# Patient Record
Sex: Female | Born: 1940 | Race: White | Hispanic: No | Marital: Married | Smoking: Former smoker
Health system: Southern US, Community
[De-identification: ages and names within clinical notes are randomized; demographics above are authoritative.]

## PROBLEM LIST (undated history)

## (undated) DIAGNOSIS — I809 Phlebitis and thrombophlebitis of unspecified site: Secondary | ICD-10-CM

## (undated) DIAGNOSIS — K2100 Gastro-esophageal reflux disease with esophagitis, without bleeding: Secondary | ICD-10-CM

## (undated) DIAGNOSIS — M349 Systemic sclerosis, unspecified: Secondary | ICD-10-CM

## (undated) DIAGNOSIS — G4733 Obstructive sleep apnea (adult) (pediatric): Secondary | ICD-10-CM

## (undated) DIAGNOSIS — R0600 Dyspnea, unspecified: Secondary | ICD-10-CM

## (undated) DIAGNOSIS — R011 Cardiac murmur, unspecified: Secondary | ICD-10-CM

## (undated) DIAGNOSIS — Z9989 Dependence on other enabling machines and devices: Secondary | ICD-10-CM

## (undated) DIAGNOSIS — J849 Interstitial pulmonary disease, unspecified: Secondary | ICD-10-CM

## (undated) DIAGNOSIS — J8410 Pulmonary fibrosis, unspecified: Secondary | ICD-10-CM

## (undated) DIAGNOSIS — K2270 Barrett's esophagus without dysplasia: Secondary | ICD-10-CM

## (undated) DIAGNOSIS — K219 Gastro-esophageal reflux disease without esophagitis: Secondary | ICD-10-CM

## (undated) DIAGNOSIS — M069 Rheumatoid arthritis, unspecified: Secondary | ICD-10-CM

## (undated) DIAGNOSIS — Z85038 Personal history of other malignant neoplasm of large intestine: Secondary | ICD-10-CM

## (undated) DIAGNOSIS — I272 Pulmonary hypertension, unspecified: Secondary | ICD-10-CM

## (undated) DIAGNOSIS — Z7902 Long term (current) use of antithrombotics/antiplatelets: Secondary | ICD-10-CM

## (undated) DIAGNOSIS — M109 Gout, unspecified: Secondary | ICD-10-CM

## (undated) DIAGNOSIS — Z7960 Long term (current) use of unspecified immunomodulators and immunosuppressants: Secondary | ICD-10-CM

## (undated) DIAGNOSIS — Z79899 Other long term (current) drug therapy: Secondary | ICD-10-CM

## (undated) DIAGNOSIS — I10 Essential (primary) hypertension: Secondary | ICD-10-CM

## (undated) DIAGNOSIS — E119 Type 2 diabetes mellitus without complications: Secondary | ICD-10-CM

## (undated) DIAGNOSIS — K449 Diaphragmatic hernia without obstruction or gangrene: Secondary | ICD-10-CM

## (undated) DIAGNOSIS — R0609 Other forms of dyspnea: Secondary | ICD-10-CM

## (undated) DIAGNOSIS — E7800 Pure hypercholesterolemia, unspecified: Secondary | ICD-10-CM

## (undated) DIAGNOSIS — G4730 Sleep apnea, unspecified: Secondary | ICD-10-CM

## (undated) DIAGNOSIS — I700 Atherosclerosis of aorta: Secondary | ICD-10-CM

## (undated) DIAGNOSIS — I739 Peripheral vascular disease, unspecified: Secondary | ICD-10-CM

## (undated) DIAGNOSIS — N183 Chronic kidney disease, stage 3 unspecified: Secondary | ICD-10-CM

## (undated) DIAGNOSIS — I447 Left bundle-branch block, unspecified: Secondary | ICD-10-CM

## (undated) DIAGNOSIS — Z9981 Dependence on supplemental oxygen: Secondary | ICD-10-CM

## (undated) DIAGNOSIS — D126 Benign neoplasm of colon, unspecified: Secondary | ICD-10-CM

## (undated) DIAGNOSIS — C801 Malignant (primary) neoplasm, unspecified: Secondary | ICD-10-CM

## (undated) DIAGNOSIS — M1990 Unspecified osteoarthritis, unspecified site: Secondary | ICD-10-CM

## (undated) HISTORY — DX: Unspecified osteoarthritis, unspecified site: M19.90

## (undated) HISTORY — DX: Personal history of other malignant neoplasm of large intestine: Z85.038

## (undated) HISTORY — DX: Phlebitis and thrombophlebitis of unspecified site: I80.9

## (undated) HISTORY — DX: Pulmonary fibrosis, unspecified: J84.10

## (undated) HISTORY — DX: Gout, unspecified: M10.9

## (undated) HISTORY — DX: Gastro-esophageal reflux disease with esophagitis: K21.0

## (undated) HISTORY — DX: Gastro-esophageal reflux disease with esophagitis, without bleeding: K21.00

## (undated) HISTORY — PX: BREAST CYST ASPIRATION: SHX578

## (undated) HISTORY — DX: Essential (primary) hypertension: I10

## (undated) HISTORY — DX: Pure hypercholesterolemia, unspecified: E78.00

## (undated) HISTORY — DX: Rheumatoid arthritis, unspecified: M06.9

## (undated) HISTORY — PX: VEIN LIGATION AND STRIPPING: SHX2653

## (undated) HISTORY — DX: Pulmonary hypertension, unspecified: I27.20

## (undated) HISTORY — DX: Type 2 diabetes mellitus without complications: E11.9

## (undated) HISTORY — DX: Barrett's esophagus without dysplasia: K22.70

## (undated) HISTORY — DX: Systemic sclerosis, unspecified: M34.9

---

## 1970-05-12 HISTORY — PX: TUBAL LIGATION: SHX77

## 1970-05-12 HISTORY — PX: APPENDECTOMY: SHX54

## 2004-08-20 ENCOUNTER — Ambulatory Visit: Payer: Self-pay | Admitting: Unknown Physician Specialty

## 2004-10-15 ENCOUNTER — Ambulatory Visit: Payer: Self-pay | Admitting: Internal Medicine

## 2006-01-27 ENCOUNTER — Ambulatory Visit: Payer: Self-pay | Admitting: Internal Medicine

## 2007-06-29 ENCOUNTER — Ambulatory Visit: Payer: Self-pay | Admitting: Internal Medicine

## 2007-11-02 ENCOUNTER — Ambulatory Visit: Payer: Self-pay | Admitting: Internal Medicine

## 2008-02-02 ENCOUNTER — Ambulatory Visit: Payer: Self-pay | Admitting: Unknown Physician Specialty

## 2008-07-10 ENCOUNTER — Ambulatory Visit: Payer: Self-pay | Admitting: Internal Medicine

## 2009-08-27 ENCOUNTER — Ambulatory Visit: Payer: Self-pay | Admitting: Internal Medicine

## 2010-08-26 ENCOUNTER — Ambulatory Visit: Payer: Self-pay | Admitting: Rheumatology

## 2010-09-03 ENCOUNTER — Ambulatory Visit: Payer: Self-pay | Admitting: Internal Medicine

## 2010-11-18 LAB — PULMONARY FUNCTION TEST

## 2011-09-04 ENCOUNTER — Ambulatory Visit: Payer: Self-pay | Admitting: Internal Medicine

## 2012-02-24 DIAGNOSIS — I272 Pulmonary hypertension, unspecified: Secondary | ICD-10-CM

## 2012-02-24 HISTORY — DX: Pulmonary hypertension, unspecified: I27.20

## 2012-05-14 ENCOUNTER — Ambulatory Visit: Payer: Self-pay | Admitting: Unknown Physician Specialty

## 2012-05-17 LAB — PATHOLOGY REPORT

## 2012-05-22 ENCOUNTER — Encounter: Payer: Self-pay | Admitting: Internal Medicine

## 2012-05-22 DIAGNOSIS — M069 Rheumatoid arthritis, unspecified: Secondary | ICD-10-CM | POA: Insufficient documentation

## 2012-05-22 DIAGNOSIS — E1169 Type 2 diabetes mellitus with other specified complication: Secondary | ICD-10-CM | POA: Insufficient documentation

## 2012-05-22 DIAGNOSIS — I10 Essential (primary) hypertension: Secondary | ICD-10-CM

## 2012-05-22 DIAGNOSIS — M109 Gout, unspecified: Secondary | ICD-10-CM | POA: Insufficient documentation

## 2012-05-22 DIAGNOSIS — E119 Type 2 diabetes mellitus without complications: Secondary | ICD-10-CM | POA: Insufficient documentation

## 2012-05-22 DIAGNOSIS — K219 Gastro-esophageal reflux disease without esophagitis: Secondary | ICD-10-CM | POA: Insufficient documentation

## 2012-05-22 DIAGNOSIS — K2270 Barrett's esophagus without dysplasia: Secondary | ICD-10-CM

## 2012-05-22 DIAGNOSIS — M0579 Rheumatoid arthritis with rheumatoid factor of multiple sites without organ or systems involvement: Secondary | ICD-10-CM | POA: Insufficient documentation

## 2012-05-22 DIAGNOSIS — Z86010 Personal history of colonic polyps: Secondary | ICD-10-CM

## 2012-05-22 DIAGNOSIS — E7800 Pure hypercholesterolemia, unspecified: Secondary | ICD-10-CM

## 2012-05-25 ENCOUNTER — Encounter: Payer: Self-pay | Admitting: Internal Medicine

## 2012-05-25 ENCOUNTER — Ambulatory Visit (INDEPENDENT_AMBULATORY_CARE_PROVIDER_SITE_OTHER): Payer: Medicare Other | Admitting: Internal Medicine

## 2012-05-25 VITALS — BP 146/84 | HR 73 | Temp 98.4°F | Ht 67.0 in | Wt 215.2 lb

## 2012-05-25 DIAGNOSIS — I10 Essential (primary) hypertension: Secondary | ICD-10-CM

## 2012-05-25 DIAGNOSIS — E7800 Pure hypercholesterolemia, unspecified: Secondary | ICD-10-CM

## 2012-05-25 DIAGNOSIS — K219 Gastro-esophageal reflux disease without esophagitis: Secondary | ICD-10-CM

## 2012-05-25 DIAGNOSIS — Z86010 Personal history of colonic polyps: Secondary | ICD-10-CM

## 2012-05-25 DIAGNOSIS — E119 Type 2 diabetes mellitus without complications: Secondary | ICD-10-CM

## 2012-05-25 DIAGNOSIS — E039 Hypothyroidism, unspecified: Secondary | ICD-10-CM

## 2012-05-25 DIAGNOSIS — K2270 Barrett's esophagus without dysplasia: Secondary | ICD-10-CM

## 2012-05-25 DIAGNOSIS — M069 Rheumatoid arthritis, unspecified: Secondary | ICD-10-CM

## 2012-05-25 DIAGNOSIS — M109 Gout, unspecified: Secondary | ICD-10-CM

## 2012-05-25 MED ORDER — FLUTICASONE PROPIONATE 50 MCG/ACT NA SUSP: 2.0000 | Freq: Every day | NASAL | Status: DC

## 2012-05-25 NOTE — Progress Notes (Signed)
Subjective:    Patient ID: Nina Perez, female    DOB: [birth year], 72 y.o.   MRN: [id number]  HPI 72 year old female with past history of hypertension, inflammatory arthritis and diabetes who comes in today for a scheduled follow up.  She states she has been doing well.  Breathing stable.  Had her flu shot.  Some increased post nasal drainage.  Minimal nasal dripping.  No sinus pressure.  No fever.  No cough.  No acid reflux.  Joints doing well.  Brought in no sugar readings.  States am sugars averaging 120s.  Blood pressure varying.    Past Medical History  Diagnosis Date  . Hypertension   . Phlebitis     x2 (with pregnancy)  . Hypercholesterolemia   . Diabetes mellitus   . Gout   . Scleroderma     raynaud's, sclerodactyly, telangiectasias  . Rheumatoid arthritis     positive RF, FANA, RNP, negative CCP ab and anti DNA,.  neg anti-SCL 70  . Inflammatory arthritis   . Pulmonary fibrosis     mild  . Pulmonary hypertension   . Osteoarthritis     knees, spine  . Reflux esophagitis   . Barrett's esophagus   . History of colon cancer     adenomatous polyps    Current Outpatient Prescriptions on File Prior to Visit  Medication Sig Dispense Refill  . allopurinol (ZYLOPRIM) 300 MG tablet Take 300 mg by mouth daily.      Marland Kitchen amLODipine (NORVASC) 10 MG tablet Take 10 mg by mouth daily.      Marland Kitchen aspirin 81 MG tablet Take 81 mg by mouth daily.      . folic acid (FOLVITE) 1 MG tablet Take 1 mg by mouth daily.      . hydrochlorothiazide (HYDRODIURIL) 50 MG tablet Take 50 mg by mouth daily.      Marland Kitchen lisinopril (PRINIVIL,ZESTRIL) 40 MG tablet Take 40 mg by mouth daily.      . metFORMIN (GLUCOPHAGE) 500 MG tablet Take 500 mg by mouth 2 (two) times daily with a meal.      . methotrexate 2.5 MG tablet Take 6 tablets q week      . metoprolol succinate (TOPROL-XL) 50 MG 24 hr tablet Take 50 mg by mouth daily. Take with or immediately following a meal.      . naproxen (NAPROSYN) 500 MG tablet Take 500  mg by mouth 2 (two) times daily as needed.      Marland Kitchen omeprazole (PRILOSEC) 20 MG capsule Take 20 mg by mouth daily.      . fluticasone (FLONASE) 50 MCG/ACT nasal spray Place 2 sprays into the nose daily.  16 g  1    Review of Systems Patient denies any headache, lightheadedness or dizziness.  Some increased post nasal drainage. No chest pain, tightness or palpitations.  No increased shortness of breath, cough or congestion.  No nausea or vomiting.  No abdominal pain or cramping.  No bowel change, such as diarrhea, constipation, BRBPR or melana.  No urine change.   Joints doing well.      Objective:   Physical Exam Filed Vitals:   05/25/12 1128  BP: 146/84  Pulse: 73  Temp: 98.4 F (36.9 C)   Blood pressure recheck:  87/66  72 year old female in no acute distress.   HEENT:  Nares - clear.  OP- without lesions or erythema.  NECK:  Supple, nontender.  No audible bruit.   HEART:  Appears to be regular. LUNGS:  Without crackles or wheezing audible.  Respirations even and unlabored.   RADIAL PULSE:  Equal bilaterally.  ABDOMEN:  Soft, nontender.  No audible abdominal bruit.   EXTREMITIES:  No increased edema to be present.  Stable.  DP pulses palpable and equal bilaterally (2 plus).                   Assessment & Plan:  ALLERGIES.  Saline nasal spray and Flonase nasal spray as directed.  Notify me if any progression.   ELEVATED TSH.  Recheck to confirm wnl.   CARDIOVASCULAR.  Asymptomatic.  Continue risk factor modification.   HEALTH MAINTENANCE.  Physical 06/26/11.  Schedule physical at next visit.  Mammogram 09/04/11 - Birads I.  Colonoscopy as outlined.  Wanted to hold on pneumovax.  Will get at her lab appt.

## 2012-05-30 ENCOUNTER — Encounter: Payer: Self-pay | Admitting: Internal Medicine

## 2012-05-30 NOTE — Assessment & Plan Note (Signed)
Low cholesterol diet and exercise.  Follow lipid panel.   

## 2012-05-30 NOTE — Assessment & Plan Note (Signed)
Blood pressure is under reasonable control.  Same medication.  Check metabolic panel.

## 2012-05-30 NOTE — Assessment & Plan Note (Signed)
Just had colonoscopy.  See above.  Currently doing well.  Follow.

## 2012-05-30 NOTE — Assessment & Plan Note (Signed)
Sees Dr Gavin Potters.  Doing well.  On MTX.

## 2012-05-30 NOTE — Assessment & Plan Note (Signed)
Currently doing well on Prilosec.  Follow.   

## 2012-05-30 NOTE — Assessment & Plan Note (Signed)
Symptoms controlled.  Follow.  Just had EGD.  See above.

## 2012-05-30 NOTE — Assessment & Plan Note (Signed)
Brought in no recorded sugar readings.  Low carb/diabetic diet.  Follow. Check metabolic panel and a1c.  Follow.

## 2012-05-30 NOTE — Assessment & Plan Note (Signed)
Stable on Allopurinol.  Follow.   

## 2012-06-08 ENCOUNTER — Encounter: Payer: Self-pay | Admitting: Internal Medicine

## 2012-06-09 ENCOUNTER — Other Ambulatory Visit: Payer: Medicare Other

## 2012-06-17 ENCOUNTER — Other Ambulatory Visit (INDEPENDENT_AMBULATORY_CARE_PROVIDER_SITE_OTHER): Payer: Medicare Other

## 2012-06-17 DIAGNOSIS — E7800 Pure hypercholesterolemia, unspecified: Secondary | ICD-10-CM

## 2012-06-17 DIAGNOSIS — E119 Type 2 diabetes mellitus without complications: Secondary | ICD-10-CM

## 2012-06-17 DIAGNOSIS — E039 Hypothyroidism, unspecified: Secondary | ICD-10-CM

## 2012-06-17 LAB — BASIC METABOLIC PANEL
GFR: 71.02 mL/min (ref >60.00)
Glucose, Bld: 125 mg/dL — ABNORMAL HIGH (ref 70–99)
Potassium: 3.3 mEq/L — ABNORMAL LOW (ref 3.5–5.1)
Sodium: 139 mEq/L (ref 135–145)

## 2012-06-17 LAB — LIPID PANEL
HDL: 33.3 mg/dL — ABNORMAL LOW (ref >39.00)
LDL Cholesterol: 90 mg/dL (ref 0–99)
Total CHOL/HDL Ratio: 5
VLDL: 30.8 mg/dL (ref 0.0–40.0)

## 2012-06-17 LAB — HEPATIC FUNCTION PANEL
Alkaline Phosphatase: 70 U/L (ref 39–117)
Bilirubin, Direct: 0.1 mg/dL (ref 0.0–0.3)
Total Bilirubin: 0.8 mg/dL (ref 0.3–1.2)
Total Protein: 7.1 g/dL (ref 6.0–8.3)

## 2012-06-17 LAB — TSH: TSH: 4.19 u[IU]/mL (ref 0.35–5.50)

## 2012-06-25 ENCOUNTER — Other Ambulatory Visit: Payer: Medicare Other

## 2012-06-29 ENCOUNTER — Telehealth: Payer: Self-pay | Admitting: *Deleted

## 2012-06-29 NOTE — Telephone Encounter (Signed)
Pt is coming in for labs tomorrow (02.19.2014) what labs and dx would you like? Thank you  

## 2012-06-30 ENCOUNTER — Other Ambulatory Visit (INDEPENDENT_AMBULATORY_CARE_PROVIDER_SITE_OTHER): Payer: Medicare Other

## 2012-06-30 ENCOUNTER — Other Ambulatory Visit: Payer: Self-pay | Admitting: Internal Medicine

## 2012-06-30 DIAGNOSIS — E876 Hypokalemia: Secondary | ICD-10-CM

## 2012-06-30 NOTE — Telephone Encounter (Signed)
It is just a potassium check.  i placed the order for the lab.  Thanks.

## 2012-06-30 NOTE — Progress Notes (Signed)
Order placed for follow up potassium.  

## 2012-07-08 ENCOUNTER — Other Ambulatory Visit: Payer: Self-pay | Admitting: *Deleted

## 2012-07-08 MED ORDER — ALLOPURINOL 300 MG PO TABS: 300.0000 mg | ORAL_TABLET | Freq: Every day | ORAL | Status: DC

## 2012-07-08 NOTE — Telephone Encounter (Signed)
Prescription was sent in to wrong pharmacy.

## 2012-07-08 NOTE — Addendum Note (Signed)
Addended by: Marlene Lard on: 07/08/2012 01:48 PM   Modules accepted: Medications

## 2012-07-09 ENCOUNTER — Ambulatory Visit (INDEPENDENT_AMBULATORY_CARE_PROVIDER_SITE_OTHER): Payer: Medicare Other | Admitting: Internal Medicine

## 2012-07-09 ENCOUNTER — Encounter: Payer: Self-pay | Admitting: Internal Medicine

## 2012-07-09 VITALS — BP 144/88 | HR 72 | Temp 98.5°F | Ht 67.0 in | Wt 227.8 lb

## 2012-07-09 DIAGNOSIS — K219 Gastro-esophageal reflux disease without esophagitis: Secondary | ICD-10-CM

## 2012-07-09 DIAGNOSIS — M069 Rheumatoid arthritis, unspecified: Secondary | ICD-10-CM

## 2012-07-09 DIAGNOSIS — I10 Essential (primary) hypertension: Secondary | ICD-10-CM

## 2012-07-09 MED ORDER — SPIRONOLACTONE 25 MG PO TABS: 25.0000 mg | ORAL_TABLET | Freq: Every day | ORAL | Status: DC

## 2012-07-10 ENCOUNTER — Encounter: Payer: Self-pay | Admitting: Internal Medicine

## 2012-07-10 NOTE — Assessment & Plan Note (Signed)
Symptoms controlled.  Follow.  Just had EGD.  See above.  

## 2012-07-10 NOTE — Assessment & Plan Note (Signed)
Sees Dr Kernodle.  Doing well.  On MTX.       

## 2012-07-10 NOTE — Assessment & Plan Note (Signed)
Low cholesterol diet and exercise.  Follow lipid panel.   

## 2012-07-10 NOTE — Progress Notes (Signed)
Subjective:    Patient ID: Nina Perez, female    DOB: August 15, 1940, 72 y.o.   MRN: [id number]  HPI 72 year old female with past history of hypertension, inflammatory arthritis and diabetes who comes in today for a scheduled follow up.  She states she has been doing well.  Breathing stable.  No sinus pressure.  No fever.  No cough.  No acid reflux.  Joints doing well.  Brought in no sugar readings.  Blood pressure averaging mostly 130-140/70s.     Past Medical History  Diagnosis Date  . Hypertension   . Phlebitis     x2 (with pregnancy)  . Hypercholesterolemia   . Diabetes mellitus   . Gout   . Scleroderma     raynaud's, sclerodactyly, telangiectasias  . Rheumatoid arthritis     positive RF, FANA, RNP, negative CCP ab and anti DNA,.  neg anti-SCL 70  . Inflammatory arthritis   . Pulmonary fibrosis     mild  . Pulmonary hypertension   . Osteoarthritis     knees, spine  . Reflux esophagitis   . Barrett's esophagus   . History of colon cancer     adenomatous polyps    Current Outpatient Prescriptions on File Prior to Visit  Medication Sig Dispense Refill  . allopurinol (ZYLOPRIM) 300 MG tablet Take 1 tablet (300 mg total) by mouth daily.  30 tablet  5  . amLODipine (NORVASC) 10 MG tablet Take 10 mg by mouth daily.      Marland Kitchen aspirin 81 MG tablet Take 81 mg by mouth daily.      . fluticasone (FLONASE) 50 MCG/ACT nasal spray Place 2 sprays into the nose daily.  16 g  1  . folic acid (FOLVITE) 1 MG tablet Take 1 mg by mouth daily.      Marland Kitchen lisinopril (PRINIVIL,ZESTRIL) 40 MG tablet Take 40 mg by mouth daily.      . metFORMIN (GLUCOPHAGE) 500 MG tablet Take 500 mg by mouth 2 (two) times daily with a meal.      . methotrexate 2.5 MG tablet Take 6 tablets q week      . metoprolol succinate (TOPROL-XL) 50 MG 24 hr tablet Take 50 mg by mouth daily. Take with or immediately following a meal.      . naproxen (NAPROSYN) 500 MG tablet Take 500 mg by mouth 2 (two) times daily as needed.      Marland Kitchen  omeprazole (PRILOSEC) 20 MG capsule Take 20 mg by mouth daily.       No current facility-administered medications on file prior to visit.    Review of Systems Patient denies any headache, lightheadedness or dizziness.  No significant sinus or allergy symptoms.   No chest pain, tightness or palpitations.  No increased shortness of breath, cough or congestion.  No nausea or vomiting.  No abdominal pain or cramping.  No bowel change, such as diarrhea, constipation, BRBPR or melana.  No urine change.   Joints doing well.  Recent lab checks have revealed decreased potassium.  Here to discuss changing meds.       Objective:   Physical Exam  Filed Vitals:   07/09/12 1503  BP: 144/88  Pulse: 72  Temp: 98.5 F (36.9 C)   Blood pressure recheck:  21/35  72 year old female in no acute distress.   HEENT:  Nares - clear.  OP- without lesions or erythema.  NECK:  Supple, nontender.  No audible bruit.   HEART:  Appears  to be regular. LUNGS:  Without crackles or wheezing audible.  Respirations even and unlabored.   RADIAL PULSE:  Equal bilaterally.  ABDOMEN:  Soft, nontender.  No audible abdominal bruit.   EXTREMITIES:  No increased edema to be present.  Stable.  DP pulses palpable and equal bilaterally (2 plus).                   Assessment & Plan:  HYPOKALEMIA.  Will stop HCTZ.  Start spironolactone 25mg  q day.  Follow metabolic panel.  Check within the next 2 weeks.   CARDIOVASCULAR.  Asymptomatic.  Continue risk factor modification.   HEALTH MAINTENANCE.  Physical 06/26/11.  Physical scheduled in 4/14.  Mammogram 09/04/11 - Birads I.  Colonoscopy as outlined.

## 2012-07-10 NOTE — Assessment & Plan Note (Signed)
Stable on Allopurinol.  Follow.   

## 2012-07-10 NOTE — Assessment & Plan Note (Signed)
Brought in no recorded sugar readings.  Low carb/diabetic diet.  Follow.

## 2012-07-10 NOTE — Assessment & Plan Note (Signed)
Currently doing well on Prilosec.  Follow.

## 2012-07-10 NOTE — Assessment & Plan Note (Signed)
Just had colonoscopy.  See above.  Currently doing well.  Follow.  

## 2012-07-10 NOTE — Assessment & Plan Note (Signed)
Blood pressure as outlined.  Recent potassium - low.  Will stop hctz.  Start spironolactone 25mg  qday.  Check metabolic panel within the next 2 weeks.

## 2012-07-21 ENCOUNTER — Telehealth: Payer: Self-pay | Admitting: Internal Medicine

## 2012-07-21 ENCOUNTER — Other Ambulatory Visit (INDEPENDENT_AMBULATORY_CARE_PROVIDER_SITE_OTHER): Payer: Medicare Other

## 2012-07-21 DIAGNOSIS — I10 Essential (primary) hypertension: Secondary | ICD-10-CM

## 2012-07-21 LAB — BASIC METABOLIC PANEL
Chloride: 102 mEq/L (ref 96–112)
GFR: 63.14 mL/min (ref >60.00)
Glucose, Bld: 177 mg/dL — ABNORMAL HIGH (ref 70–99)
Potassium: 4.1 mEq/L (ref 3.5–5.1)
Sodium: 138 mEq/L (ref 135–145)

## 2012-07-21 MED ORDER — LISINOPRIL 40 MG PO TABS: 40.0000 mg | ORAL_TABLET | Freq: Every day | ORAL | Status: DC

## 2012-07-21 MED ORDER — AMLODIPINE BESYLATE 10 MG PO TABS: 10.0000 mg | ORAL_TABLET | Freq: Every day | ORAL | Status: DC

## 2012-07-21 NOTE — Telephone Encounter (Signed)
Appointment made

## 2012-07-21 NOTE — Telephone Encounter (Signed)
Pt notified of lab results and need for a follow up potassium check in a couple of weeks.  Wants to come in 08/04/12 at 10:30.  Please put on lab schedule.  Thanks.

## 2012-08-04 ENCOUNTER — Other Ambulatory Visit (INDEPENDENT_AMBULATORY_CARE_PROVIDER_SITE_OTHER): Payer: Medicare Other

## 2012-08-04 DIAGNOSIS — E876 Hypokalemia: Secondary | ICD-10-CM

## 2012-08-05 ENCOUNTER — Encounter: Payer: Self-pay | Admitting: *Deleted

## 2012-09-02 ENCOUNTER — Ambulatory Visit (INDEPENDENT_AMBULATORY_CARE_PROVIDER_SITE_OTHER): Payer: Medicare Other | Admitting: Internal Medicine

## 2012-09-02 ENCOUNTER — Encounter: Payer: Self-pay | Admitting: Internal Medicine

## 2012-09-02 ENCOUNTER — Other Ambulatory Visit (HOSPITAL_COMMUNITY)
Admission: RE | Admit: 2012-09-02 | Discharge: 2012-09-02 | Disposition: A | Discharge status: Home / Self Care | Payer: Medicare Other | Source: Ambulatory Visit | Attending: Internal Medicine | Admitting: Internal Medicine

## 2012-09-02 VITALS — BP 130/80 | HR 78 | Temp 98.5°F | Ht 65.75 in | Wt 225.8 lb

## 2012-09-02 DIAGNOSIS — M069 Rheumatoid arthritis, unspecified: Secondary | ICD-10-CM

## 2012-09-02 DIAGNOSIS — I10 Essential (primary) hypertension: Secondary | ICD-10-CM

## 2012-09-02 DIAGNOSIS — Z1239 Encounter for other screening for malignant neoplasm of breast: Secondary | ICD-10-CM

## 2012-09-02 DIAGNOSIS — Z01419 Encounter for gynecological examination (general) (routine) without abnormal findings: Secondary | ICD-10-CM | POA: Insufficient documentation

## 2012-09-02 DIAGNOSIS — Z860100 Personal history of colon polyps, unspecified: Secondary | ICD-10-CM

## 2012-09-02 DIAGNOSIS — K219 Gastro-esophageal reflux disease without esophagitis: Secondary | ICD-10-CM

## 2012-09-02 DIAGNOSIS — Z124 Encounter for screening for malignant neoplasm of cervix: Secondary | ICD-10-CM

## 2012-09-02 DIAGNOSIS — Z1151 Encounter for screening for human papillomavirus (HPV): Secondary | ICD-10-CM | POA: Insufficient documentation

## 2012-09-02 DIAGNOSIS — E119 Type 2 diabetes mellitus without complications: Secondary | ICD-10-CM

## 2012-09-02 DIAGNOSIS — M109 Gout, unspecified: Secondary | ICD-10-CM

## 2012-09-02 DIAGNOSIS — E7800 Pure hypercholesterolemia, unspecified: Secondary | ICD-10-CM

## 2012-09-02 DIAGNOSIS — K2270 Barrett's esophagus without dysplasia: Secondary | ICD-10-CM

## 2012-09-02 NOTE — Progress Notes (Signed)
Subjective:    Patient ID: Nina Perez, female    DOB: [birth year], 72 y.o.   MRN: [id number]  HPI 72 year old female with past history of hypertension, inflammatory arthritis and diabetes who comes in today to follow up on these issues as well as for a complete physical exam.  She states she has been doing well.  Breathing stable.  No sinus pressure.  No fever.  No cough.  No acid reflux.  Joints doing well.  Blood sugars running 100-160s.  No times recorded.   Blood pressure averaging mostly 120-140/70-80.  Overall she feels she is doing well.  Trying to do better - watching her diet.       Past Medical History  Diagnosis Date  . Hypertension   . Phlebitis     x2 (with pregnancy)  . Hypercholesterolemia   . Diabetes mellitus   . Gout   . Scleroderma     raynaud's, sclerodactyly, telangiectasias  . Rheumatoid arthritis     positive RF, FANA, RNP, negative CCP ab and anti DNA,.  neg anti-SCL 70  . Inflammatory arthritis   . Pulmonary fibrosis     mild  . Pulmonary hypertension   . Osteoarthritis     knees, spine  . Reflux esophagitis   . Barrett's esophagus   . History of colon cancer     adenomatous polyps    Current Outpatient Prescriptions on File Prior to Visit  Medication Sig Dispense Refill  . allopurinol (ZYLOPRIM) 300 MG tablet Take 1 tablet (300 mg total) by mouth daily.  30 tablet  5  . amLODipine (NORVASC) 10 MG tablet Take 1 tablet (10 mg total) by mouth daily.  90 tablet  1  . aspirin 81 MG tablet Take 81 mg by mouth daily.      . fluticasone (FLONASE) 50 MCG/ACT nasal spray Place 2 sprays into the nose daily.  16 g  1  . folic acid (FOLVITE) 1 MG tablet Take 1 mg by mouth daily.      Marland Kitchen lisinopril (PRINIVIL,ZESTRIL) 40 MG tablet Take 1 tablet (40 mg total) by mouth daily.  90 tablet  1  . metFORMIN (GLUCOPHAGE) 500 MG tablet Take 500 mg by mouth 2 (two) times daily with a meal.      . methotrexate 2.5 MG tablet Take 6 tablets q week      . metoprolol succinate  (TOPROL-XL) 50 MG 24 hr tablet Take 50 mg by mouth daily. Take with or immediately following a meal.      . naproxen (NAPROSYN) 500 MG tablet Take 500 mg by mouth 2 (two) times daily as needed.      Marland Kitchen omeprazole (PRILOSEC) 20 MG capsule Take 20 mg by mouth daily.      Marland Kitchen spironolactone (ALDACTONE) 25 MG tablet Take 1 tablet (25 mg total) by mouth daily.  30 tablet  1   No current facility-administered medications on file prior to visit.    Review of Systems Patient denies any headache, lightheadedness or dizziness.  No significant sinus or allergy symptoms.   No chest pain, tightness or palpitations.  No increased shortness of breath, cough or congestion.  No nausea or vomiting.  No abdominal pain or cramping.  No bowel change, such as diarrhea, constipation, BRBPR or melana.  No urine change.   Joints doing well.  Sugars and blood pressure as outlined.       Objective:   Physical Exam  Filed Vitals:   09/02/12 4098  BP: 130/80  Pulse: 78  Temp: 98.5 F (31.75 C)   72 year old female in no acute distress.   HEENT:  Nares- clear.  Oropharynx - without lesions. NECK:  Supple.  Nontender.  No audible bruit.  HEART:  Appears to be regular. LUNGS:  No crackles or wheezing audible.  Respirations even and unlabored.  RADIAL PULSE:  Equal bilaterally.    BREASTS:  No nipple discharge or nipple retraction present.  Could not appreciate any distinct nodules or axillary adenopathy.  ABDOMEN:  Soft, nontender.  Bowel sounds present and normal.  No audible abdominal bruit.  GU:  Normal external genitalia.  Vaginal vault without lesions.  Cervix identified.  Pap performed. Could not appreciate any adnexal masses or tenderness.   RECTAL:  Heme negative.   EXTREMITIES:  No increased edema present.  DP pulses palpable and equal bilaterally.            Assessment & Plan:  HYPOKALEMIA.  Started spironolactone 25mg  q day.  Potassium wnl now.    CARDIOVASCULAR.  Asymptomatic.  Continue risk factor  modification.   HEALTH MAINTENANCE.  Physical today.  Mammogram 09/04/11 - Birads I.  Schedule a follow up mammogram.  Colonoscopy as outlined.

## 2012-09-05 ENCOUNTER — Encounter: Payer: Self-pay | Admitting: Internal Medicine

## 2012-09-05 NOTE — Assessment & Plan Note (Signed)
Sees Dr Gavin Potters.  Doing well.  On MTX.

## 2012-09-05 NOTE — Assessment & Plan Note (Signed)
Low carb/diabetic diet.  Sugars as outlined.  Trying to do better watching her diet.  Follow.  Check and record sugars bid.  Keep up to date with eye checks.  Check metabolic panel and a1c.

## 2012-09-05 NOTE — Assessment & Plan Note (Signed)
Stable on Allopurinol.  Follow.   

## 2012-09-05 NOTE — Assessment & Plan Note (Addendum)
Symptoms controlled.  Follow.  Just had EGD.    

## 2012-09-05 NOTE — Assessment & Plan Note (Signed)
Up to date with colonoscopy.  Just had - 05/14/12.    

## 2012-09-05 NOTE — Assessment & Plan Note (Signed)
Blood pressure appears to be doing relatively well.  Same medication regimen. Follow.  Check metabolic panel with next labs.

## 2012-09-05 NOTE — Assessment & Plan Note (Signed)
Currently doing well on Prilosec.  Follow.

## 2012-09-05 NOTE — Assessment & Plan Note (Signed)
Low cholesterol diet and exercise.  Follow lipid panel.   

## 2012-09-10 ENCOUNTER — Other Ambulatory Visit: Payer: Self-pay | Admitting: *Deleted

## 2012-09-10 MED ORDER — SPIRONOLACTONE 25 MG PO TABS: 25.0000 mg | ORAL_TABLET | Freq: Every day | ORAL | Status: DC

## 2012-09-15 ENCOUNTER — Ambulatory Visit: Payer: Self-pay | Admitting: Internal Medicine

## 2012-10-08 ENCOUNTER — Encounter: Payer: Self-pay | Admitting: Internal Medicine

## 2012-10-27 ENCOUNTER — Telehealth: Payer: Self-pay | Admitting: Internal Medicine

## 2012-10-27 MED ORDER — METFORMIN HCL 500 MG PO TABS: 500.0000 mg | ORAL_TABLET | Freq: Two times a day (BID) | ORAL | Status: DC

## 2012-10-27 NOTE — Telephone Encounter (Signed)
Pt called needing refill on her metform 500mg  1 tablet by mouth twice daily [facility] in [place]

## 2012-10-27 NOTE — Telephone Encounter (Signed)
done

## 2012-11-08 ENCOUNTER — Other Ambulatory Visit: Payer: Self-pay | Admitting: *Deleted

## 2012-11-08 MED ORDER — GLUCOSE BLOOD VI STRP: ORAL_STRIP | Status: DC

## 2012-11-08 NOTE — Telephone Encounter (Signed)
Pt requested a RX for test strips to save money. Cheaper to get through pharmacy than OTC

## 2012-11-30 ENCOUNTER — Other Ambulatory Visit (INDEPENDENT_AMBULATORY_CARE_PROVIDER_SITE_OTHER): Payer: Medicare Other

## 2012-11-30 DIAGNOSIS — E7800 Pure hypercholesterolemia, unspecified: Secondary | ICD-10-CM

## 2012-11-30 DIAGNOSIS — I10 Essential (primary) hypertension: Secondary | ICD-10-CM

## 2012-11-30 DIAGNOSIS — E119 Type 2 diabetes mellitus without complications: Secondary | ICD-10-CM

## 2012-11-30 DIAGNOSIS — M069 Rheumatoid arthritis, unspecified: Secondary | ICD-10-CM

## 2012-11-30 LAB — BASIC METABOLIC PANEL
BUN: 17 mg/dL (ref 6–23)
Calcium: 10.4 mg/dL (ref 8.4–10.5)
Creatinine, Ser: 0.9 mg/dL (ref 0.4–1.2)
GFR: 64.67 mL/min (ref >60.00)

## 2012-11-30 LAB — HEPATIC FUNCTION PANEL
ALT: 24 U/L (ref 0–35)
AST: 26 U/L (ref 0–37)
Bilirubin, Direct: 0.1 mg/dL (ref 0.0–0.3)
Total Bilirubin: 0.5 mg/dL (ref 0.3–1.2)

## 2012-11-30 LAB — HEMOGLOBIN A1C: Hgb A1c MFr Bld: 6.5 % (ref 4.6–6.5)

## 2012-12-02 ENCOUNTER — Encounter: Payer: Self-pay | Admitting: Internal Medicine

## 2012-12-02 ENCOUNTER — Ambulatory Visit (INDEPENDENT_AMBULATORY_CARE_PROVIDER_SITE_OTHER): Payer: Medicare Other | Admitting: Internal Medicine

## 2012-12-02 VITALS — BP 110/80 | HR 64 | Temp 98.6°F | Ht 65.75 in | Wt 227.0 lb

## 2012-12-02 DIAGNOSIS — E119 Type 2 diabetes mellitus without complications: Secondary | ICD-10-CM

## 2012-12-02 DIAGNOSIS — E7800 Pure hypercholesterolemia, unspecified: Secondary | ICD-10-CM

## 2012-12-02 DIAGNOSIS — I10 Essential (primary) hypertension: Secondary | ICD-10-CM

## 2012-12-02 DIAGNOSIS — M069 Rheumatoid arthritis, unspecified: Secondary | ICD-10-CM

## 2012-12-02 DIAGNOSIS — Z86010 Personal history of colonic polyps: Secondary | ICD-10-CM

## 2012-12-02 DIAGNOSIS — K219 Gastro-esophageal reflux disease without esophagitis: Secondary | ICD-10-CM

## 2012-12-02 DIAGNOSIS — K2270 Barrett's esophagus without dysplasia: Secondary | ICD-10-CM

## 2012-12-02 DIAGNOSIS — M109 Gout, unspecified: Secondary | ICD-10-CM

## 2012-12-02 NOTE — Progress Notes (Signed)
Subjective:    Patient ID: Nina Perez, female    DOB: Jul 12, 1940, 72 y.o.   MRN: [id number]  HPI 72 year old female with past history of hypertension, inflammatory arthritis and diabetes who comes in today for a scheduled follow up.   She states she has been doing well.  Breathing stable.  No sinus pressure.  No fever.  No cough.  No acid reflux.  Joints doing well.  Blood sugars running 100-160s.  No times recorded.   Blood pressure averaging mostly 120-140/70-80.  Overall she feels she is doing well.  Trying to do better - watching her diet.       Past Medical History  Diagnosis Date  . Hypertension   . Phlebitis     x2 (with pregnancy)  . Hypercholesterolemia   . Diabetes mellitus   . Gout   . Scleroderma     raynaud's, sclerodactyly, telangiectasias  . Rheumatoid arthritis(714.0)     positive RF, FANA, RNP, negative CCP ab and anti DNA,.  neg anti-SCL 70  . Inflammatory arthritis   . Pulmonary fibrosis     mild  . Pulmonary hypertension   . Osteoarthritis     knees, spine  . Reflux esophagitis   . Barrett's esophagus   . History of colon cancer     adenomatous polyps    Current Outpatient Prescriptions on File Prior to Visit  Medication Sig Dispense Refill  . allopurinol (ZYLOPRIM) 300 MG tablet Take 1 tablet (300 mg total) by mouth daily.  30 tablet  5  . amLODipine (NORVASC) 10 MG tablet Take 1 tablet (10 mg total) by mouth daily.  90 tablet  1  . aspirin 81 MG tablet Take 81 mg by mouth daily.      . fluticasone (FLONASE) 50 MCG/ACT nasal spray Place 2 sprays into the nose daily.  16 g  1  . folic acid (FOLVITE) 1 MG tablet Take 1 mg by mouth daily.      Marland Kitchen glucose blood test strip Contour test strips-use to test blood sugars twice a day  100 each  12  . lisinopril (PRINIVIL,ZESTRIL) 40 MG tablet Take 1 tablet (40 mg total) by mouth daily.  90 tablet  1  . metFORMIN (GLUCOPHAGE) 500 MG tablet Take 1 tablet (500 mg total) by mouth 2 (two) times daily with a meal.  60  tablet  5  . methotrexate 2.5 MG tablet Take 6 tablets q week      . metoprolol succinate (TOPROL-XL) 50 MG 24 hr tablet Take 50 mg by mouth daily. Take with or immediately following a meal.      . naproxen (NAPROSYN) 500 MG tablet Take 500 mg by mouth 2 (two) times daily as needed.      Marland Kitchen omeprazole (PRILOSEC) 20 MG capsule Take 20 mg by mouth daily.      Marland Kitchen spironolactone (ALDACTONE) 25 MG tablet Take 1 tablet (25 mg total) by mouth daily.  30 tablet  5   No current facility-administered medications on file prior to visit.    Review of Systems Patient denies any headache, lightheadedness or dizziness.  No significant sinus or allergy symptoms.   No chest pain, tightness or palpitations.  No increased shortness of breath, cough or congestion.  No nausea or vomiting.  No abdominal pain or cramping.  No bowel change, such as diarrhea, constipation, BRBPR or melana.  No urine change.   Joints doing well.  Sugars and blood pressure as outlined.  Objective:   Physical Exam  Filed Vitals:   12/02/12 0915  BP: 110/80  Pulse: 64  Temp: 98.6 F (15 C)   72 year old female in no acute distress.   HEENT:  Nares- clear.  Oropharynx - without lesions. NECK:  Supple.  Nontender.  No audible bruit.  HEART:  Appears to be regular. LUNGS:  No crackles or wheezing audible.  Respirations even and unlabored.  RADIAL PULSE:  Equal bilaterally.  ABDOMEN:  Soft, nontender.  Bowel sounds present and normal.  No audible abdominal bruit.    EXTREMITIES:  No increased edema present.  DP pulses palpable and equal bilaterally.            Assessment & Plan:  HYPOKALEMIA.  Started spironolactone 25mg  q day.  Potassium wnl now.    CARDIOVASCULAR.  Asymptomatic.  Continue risk factor modification.   HEALTH MAINTENANCE.  Physical last visit.  Mammogram 09/15/12 - Birads II.   Colonoscopy as outlined.

## 2012-12-05 ENCOUNTER — Encounter: Payer: Self-pay | Admitting: Internal Medicine

## 2012-12-05 NOTE — Assessment & Plan Note (Signed)
Currently doing well on Prilosec.  Follow.

## 2012-12-05 NOTE — Assessment & Plan Note (Signed)
Stable on Allopurinol.  Follow.   

## 2012-12-05 NOTE — Assessment & Plan Note (Signed)
Symptoms controlled.  Follow.  Just had EGD.    

## 2012-12-05 NOTE — Assessment & Plan Note (Signed)
Low cholesterol diet and exercise.  Follow lipid panel.  Lipid panel just checked 12/02/12 revealed total cholesterol 167, triglycerides 133, HDL 36 and LDL104.

## 2012-12-05 NOTE — Assessment & Plan Note (Signed)
Blood pressure appears to be doing relatively well.  Same medication regimen.  Follow.  Follow metabolic panel.     

## 2012-12-05 NOTE — Assessment & Plan Note (Signed)
Low carb/diabetic diet.  Sugars as outlined.  Trying to do better watching her diet.  Follow.  Check and record sugars bid.  Keep up to date with eye checks.  Follow metabolic panel and a1c. A1c just checked 6.5.  Improved.

## 2012-12-05 NOTE — Assessment & Plan Note (Signed)
Sees Dr Gavin Potters.  Doing well.  On MTX.

## 2012-12-05 NOTE — Assessment & Plan Note (Signed)
Up to date with colonoscopy.  Just had - 05/14/12.    

## 2012-12-20 ENCOUNTER — Other Ambulatory Visit: Payer: Self-pay | Admitting: *Deleted

## 2012-12-20 MED ORDER — METOPROLOL SUCCINATE ER 50 MG PO TB24: 50.0000 mg | ORAL_TABLET | Freq: Every day | ORAL | Status: DC

## 2012-12-29 ENCOUNTER — Encounter: Payer: Self-pay | Admitting: *Deleted

## 2013-01-11 ENCOUNTER — Other Ambulatory Visit: Payer: Self-pay | Admitting: Internal Medicine

## 2013-01-26 ENCOUNTER — Other Ambulatory Visit: Payer: Self-pay | Admitting: *Deleted

## 2013-01-26 MED ORDER — AMLODIPINE BESYLATE 10 MG PO TABS: 10.0000 mg | ORAL_TABLET | Freq: Every day | ORAL | Status: DC

## 2013-02-22 ENCOUNTER — Other Ambulatory Visit: Payer: Self-pay | Admitting: Internal Medicine

## 2013-03-22 ENCOUNTER — Other Ambulatory Visit (INDEPENDENT_AMBULATORY_CARE_PROVIDER_SITE_OTHER): Payer: Medicare Other

## 2013-03-22 DIAGNOSIS — E119 Type 2 diabetes mellitus without complications: Secondary | ICD-10-CM

## 2013-03-22 DIAGNOSIS — E7800 Pure hypercholesterolemia, unspecified: Secondary | ICD-10-CM

## 2013-03-22 LAB — LIPID PANEL
HDL: 36.1 mg/dL — ABNORMAL LOW (ref >39.00)
LDL Cholesterol: 89 mg/dL (ref 0–99)
Total CHOL/HDL Ratio: 4
Triglycerides: 185 mg/dL — ABNORMAL HIGH (ref 0.0–149.0)
VLDL: 37 mg/dL (ref 0.0–40.0)

## 2013-03-22 LAB — BASIC METABOLIC PANEL
CO2: 30 mEq/L (ref 19–32)
Calcium: 9.9 mg/dL (ref 8.4–10.5)
Chloride: 103 mEq/L (ref 96–112)
Potassium: 4.5 mEq/L (ref 3.5–5.1)
Sodium: 138 mEq/L (ref 135–145)

## 2013-03-22 LAB — HEPATIC FUNCTION PANEL: Albumin: 3.9 g/dL (ref 3.5–5.2)

## 2013-03-23 ENCOUNTER — Encounter: Payer: Self-pay | Admitting: *Deleted

## 2013-03-29 ENCOUNTER — Encounter: Payer: Self-pay | Admitting: Internal Medicine

## 2013-03-29 ENCOUNTER — Encounter (INDEPENDENT_AMBULATORY_CARE_PROVIDER_SITE_OTHER): Payer: Self-pay

## 2013-03-29 ENCOUNTER — Ambulatory Visit (INDEPENDENT_AMBULATORY_CARE_PROVIDER_SITE_OTHER): Payer: Medicare Other | Admitting: Internal Medicine

## 2013-03-29 VITALS — BP 130/90 | HR 66 | Temp 98.0°F | Ht 65.75 in | Wt 230.2 lb

## 2013-03-29 DIAGNOSIS — M109 Gout, unspecified: Secondary | ICD-10-CM

## 2013-03-29 DIAGNOSIS — K2270 Barrett's esophagus without dysplasia: Secondary | ICD-10-CM

## 2013-03-29 DIAGNOSIS — Z86010 Personal history of colonic polyps: Secondary | ICD-10-CM

## 2013-03-29 DIAGNOSIS — E7800 Pure hypercholesterolemia, unspecified: Secondary | ICD-10-CM

## 2013-03-29 DIAGNOSIS — I10 Essential (primary) hypertension: Secondary | ICD-10-CM

## 2013-03-29 DIAGNOSIS — E119 Type 2 diabetes mellitus without complications: Secondary | ICD-10-CM

## 2013-03-29 DIAGNOSIS — M069 Rheumatoid arthritis, unspecified: Secondary | ICD-10-CM

## 2013-03-29 DIAGNOSIS — K219 Gastro-esophageal reflux disease without esophagitis: Secondary | ICD-10-CM

## 2013-03-29 MED ORDER — SPIRONOLACTONE 25 MG PO TABS: 25.0000 mg | ORAL_TABLET | Freq: Every day | ORAL | Status: DC

## 2013-03-29 MED ORDER — LISINOPRIL 40 MG PO TABS: 40.0000 mg | ORAL_TABLET | Freq: Every day | ORAL | Status: DC

## 2013-03-29 MED ORDER — GABAPENTIN 100 MG PO CAPS: 100.0000 mg | ORAL_CAPSULE | Freq: Two times a day (BID) | ORAL | Status: DC

## 2013-03-29 MED ORDER — AMLODIPINE BESYLATE 10 MG PO TABS: 10.0000 mg | ORAL_TABLET | Freq: Every day | ORAL | Status: DC

## 2013-03-29 MED ORDER — METFORMIN HCL 500 MG PO TABS: 500.0000 mg | ORAL_TABLET | Freq: Two times a day (BID) | ORAL | Status: DC

## 2013-03-29 NOTE — Progress Notes (Signed)
Subjective:    Patient ID: Nina Perez, female    DOB: [birth year], 72 y.o.   MRN: [id number]  HPI 71 year old female with past history of hypertension, inflammatory arthritis and diabetes who comes in today for a scheduled follow up.   She states she has been doing well.  Breathing stable.  No sinus pressure.  No fever.  No cough.  No acid reflux.  Joints doing well.  Blood sugars running attached.  Blood pressure doing well.   Overall she feels she is doing well.  Trying to do better - watching her diet.  She does report some burning and numbness in her feet.  Unsteady - minimal.     Past Medical History  Diagnosis Date  . Hypertension   . Phlebitis     x2 (with pregnancy)  . Hypercholesterolemia   . Diabetes mellitus   . Gout   . Scleroderma     raynaud's, sclerodactyly, telangiectasias  . Rheumatoid arthritis(714.0)     positive RF, FANA, RNP, negative CCP ab and anti DNA,.  neg anti-SCL 70  . Inflammatory arthritis   . Pulmonary fibrosis     mild  . Pulmonary hypertension   . Osteoarthritis     knees, spine  . Reflux esophagitis   . Barrett's esophagus   . History of colon cancer     adenomatous polyps    Current Outpatient Prescriptions on File Prior to Visit  Medication Sig Dispense Refill  . allopurinol (ZYLOPRIM) 300 MG tablet TAKE ONE TABLET BY MOUTH ONCE DAILY  30 tablet  5  . amLODipine (NORVASC) 10 MG tablet Take 1 tablet (10 mg total) by mouth daily.  90 tablet  1  . aspirin 81 MG tablet Take 81 mg by mouth daily.      . fluticasone (FLONASE) 50 MCG/ACT nasal spray Place 2 sprays into the nose daily.  16 g  1  . folic acid (FOLVITE) 1 MG tablet Take 1 mg by mouth daily.      Marland Kitchen glucose blood test strip Contour test strips-use to test blood sugars twice a day  100 each  12  . lisinopril (PRINIVIL,ZESTRIL) 40 MG tablet TAKE ONE TABLET BY MOUTH ONCE DAILY  90 tablet  1  . metFORMIN (GLUCOPHAGE) 500 MG tablet Take 1 tablet (500 mg total) by mouth 2 (two) times daily  with a meal.  60 tablet  5  . methotrexate 2.5 MG tablet Take 6 tablets q week      . metoprolol succinate (TOPROL-XL) 50 MG 24 hr tablet Take 1 tablet (50 mg total) by mouth daily. Take with or immediately following a meal.  30 tablet  5  . naproxen (NAPROSYN) 500 MG tablet Take 500 mg by mouth 2 (two) times daily as needed.      Marland Kitchen omeprazole (PRILOSEC) 20 MG capsule Take 20 mg by mouth daily.      Marland Kitchen spironolactone (ALDACTONE) 25 MG tablet Take 1 tablet (25 mg total) by mouth daily.  30 tablet  5   No current facility-administered medications on file prior to visit.    Review of Systems Patient denies any headache, lightheadedness or dizziness.  No significant sinus or allergy symptoms.   No chest pain, tightness or palpitations.  No increased shortness of breath, cough or congestion.  No nausea or vomiting.  No abdominal pain or cramping.  No bowel change, such as diarrhea, constipation, BRBPR or melana.  No urine change.   Joints doing ok.  Some stiffness.   Sugars attached.        Objective:   Physical Exam  Filed Vitals:   03/29/13 0949  BP: 130/90  Pulse: 66  Temp: 98 F (36.7 C)   Blood pressure recheck:   29/7  72 year old female in no acute distress.   HEENT:  Nares- clear.  Oropharynx - without lesions. NECK:  Supple.  Nontender.  No audible bruit.  HEART:  Appears to be regular. LUNGS:  No crackles or wheezing audible.  Respirations even and unlabored.  RADIAL PULSE:  Equal bilaterally.  ABDOMEN:  Soft, nontender.  Bowel sounds present and normal.  No audible abdominal bruit.    EXTREMITIES:  No increased edema present.  DP pulses palpable and equal bilaterally.   NEURO:  Some minimal decrease sensation to pin prick feet - improves as move up ankle/leg.           Assessment & Plan:  HYPOKALEMIA.  Started spironolactone 25mg  q day.  Potassium wnl now.    CARDIOVASCULAR.  Asymptomatic.  Continue risk factor modification.   HEALTH MAINTENANCE.  Physical 09/02/12.     Mammogram 09/15/12 - Birads II.   Colonoscopy as outlined.

## 2013-03-29 NOTE — Progress Notes (Signed)
Pre-visit discussion using our clinic review tool. No additional management support is needed unless otherwise documented below in the visit note.  

## 2013-03-30 ENCOUNTER — Encounter: Payer: Self-pay | Admitting: Internal Medicine

## 2013-03-30 NOTE — Assessment & Plan Note (Signed)
Symptoms controlled.  Follow.  Just had EGD.    

## 2013-03-30 NOTE — Assessment & Plan Note (Signed)
Low cholesterol diet and exercise.  Follow lipid panel.  Lipid panel just checked 11/11//14 revealed total cholesterol 162, triglycerides 185, HDL 36 and LDL 89. Nina Perez

## 2013-03-30 NOTE — Assessment & Plan Note (Signed)
Stable on Allopurinol.  Follow.   

## 2013-03-30 NOTE — Assessment & Plan Note (Signed)
Currently doing well on Prilosec.  Follow.

## 2013-03-30 NOTE — Assessment & Plan Note (Signed)
Sees Dr Kernodle.  Doing relatively well.  On MTX.   

## 2013-03-30 NOTE — Assessment & Plan Note (Signed)
Blood pressure appears to be doing relatively well.  Same medication regimen.  Follow.  Follow metabolic panel.     

## 2013-03-30 NOTE — Assessment & Plan Note (Signed)
Up to date with colonoscopy.  Just had - 05/14/12.    

## 2013-03-30 NOTE — Assessment & Plan Note (Signed)
Low carb/diabetic diet.  Sugars attached.  Trying to do better watching her diet.  Follow.  Check and record sugars bid.  Keep up to date with eye checks.  Follow metabolic panel and a1c.  A1c just checked 6.4.  Improved.

## 2013-07-12 ENCOUNTER — Other Ambulatory Visit: Payer: Self-pay | Admitting: Internal Medicine

## 2013-08-10 ENCOUNTER — Other Ambulatory Visit: Payer: Self-pay | Admitting: Internal Medicine

## 2013-08-24 ENCOUNTER — Telehealth: Payer: Self-pay | Admitting: Internal Medicine

## 2013-08-24 NOTE — Telephone Encounter (Signed)
Noted & added to appt note

## 2013-08-24 NOTE — Telephone Encounter (Signed)
Pt states she received automated call that told her to schedule a pneumonia vaccine.  Pt states she has an appt with Dr. Nicki Reaper 4/28 and this is needed she would like to receive it at that time.

## 2013-09-05 ENCOUNTER — Other Ambulatory Visit (INDEPENDENT_AMBULATORY_CARE_PROVIDER_SITE_OTHER): Payer: Medicare Other

## 2013-09-05 DIAGNOSIS — I10 Essential (primary) hypertension: Secondary | ICD-10-CM

## 2013-09-05 DIAGNOSIS — M069 Rheumatoid arthritis, unspecified: Secondary | ICD-10-CM

## 2013-09-05 DIAGNOSIS — E7800 Pure hypercholesterolemia, unspecified: Secondary | ICD-10-CM

## 2013-09-05 DIAGNOSIS — E119 Type 2 diabetes mellitus without complications: Secondary | ICD-10-CM

## 2013-09-05 LAB — BASIC METABOLIC PANEL
BUN: 20 mg/dL (ref 6–23)
CHLORIDE: 102 meq/L (ref 96–112)
CO2: 29 mEq/L (ref 19–32)
Calcium: 10.4 mg/dL (ref 8.4–10.5)
Creatinine, Ser: 0.9 mg/dL (ref 0.4–1.2)
GFR: 67.97 mL/min (ref >60.00)
Glucose, Bld: 117 mg/dL — ABNORMAL HIGH (ref 70–99)
POTASSIUM: 4.3 meq/L (ref 3.5–5.1)
SODIUM: 139 meq/L (ref 135–145)

## 2013-09-05 LAB — HEPATIC FUNCTION PANEL
ALT: 37 U/L — AB (ref 0–35)
AST: 29 U/L (ref 0–37)
Albumin: 3.9 g/dL (ref 3.5–5.2)
Alkaline Phosphatase: 66 U/L (ref 39–117)
BILIRUBIN TOTAL: 0.6 mg/dL (ref 0.3–1.2)
Bilirubin, Direct: 0.1 mg/dL (ref 0.0–0.3)
TOTAL PROTEIN: 7.7 g/dL (ref 6.0–8.3)

## 2013-09-05 LAB — LIPID PANEL
Cholesterol: 163 mg/dL (ref 0–200)
HDL: 31.8 mg/dL — AB (ref >39.00)
LDL CALC: 94 mg/dL (ref 0–99)
Total CHOL/HDL Ratio: 5
Triglycerides: 187 mg/dL — ABNORMAL HIGH (ref 0.0–149.0)
VLDL: 37.4 mg/dL (ref 0.0–40.0)

## 2013-09-05 LAB — CBC WITH DIFFERENTIAL/PLATELET
BASOS PCT: 0.5 % (ref 0.0–3.0)
Basophils Absolute: 0 10*3/uL (ref 0.0–0.1)
EOS PCT: 1.4 % (ref 0.0–5.0)
Eosinophils Absolute: 0.1 10*3/uL (ref 0.0–0.7)
HEMATOCRIT: 45 % (ref 36.0–46.0)
HEMOGLOBIN: 14.9 g/dL (ref 12.0–15.0)
LYMPHS ABS: 1.5 10*3/uL (ref 0.7–4.0)
Lymphocytes Relative: 22.2 % (ref 12.0–46.0)
MCHC: 33.1 g/dL (ref 30.0–36.0)
MCV: 100.7 fl — ABNORMAL HIGH (ref 78.0–100.0)
MONOS PCT: 6.4 % (ref 3.0–12.0)
Monocytes Absolute: 0.4 10*3/uL (ref 0.1–1.0)
Neutro Abs: 4.8 10*3/uL (ref 1.4–7.7)
Neutrophils Relative %: 69.5 % (ref 43.0–77.0)
PLATELETS: 253 10*3/uL (ref 150.0–400.0)
RBC: 4.47 Mil/uL (ref 3.87–5.11)
RDW: 15.8 % — ABNORMAL HIGH (ref 11.5–14.6)
WBC: 6.9 10*3/uL (ref 4.5–10.5)

## 2013-09-05 LAB — TSH: TSH: 4.39 u[IU]/mL (ref 0.35–5.50)

## 2013-09-05 LAB — HEMOGLOBIN A1C: HEMOGLOBIN A1C: 6.3 % (ref 4.6–6.5)

## 2013-09-06 ENCOUNTER — Ambulatory Visit (INDEPENDENT_AMBULATORY_CARE_PROVIDER_SITE_OTHER): Payer: Medicare Other | Admitting: Internal Medicine

## 2013-09-06 ENCOUNTER — Encounter: Payer: Self-pay | Admitting: Internal Medicine

## 2013-09-06 ENCOUNTER — Encounter (INDEPENDENT_AMBULATORY_CARE_PROVIDER_SITE_OTHER): Payer: Self-pay

## 2013-09-06 VITALS — BP 130/90 | HR 72 | Temp 98.1°F | Ht 66.5 in | Wt 234.5 lb

## 2013-09-06 DIAGNOSIS — Z860100 Personal history of colon polyps, unspecified: Secondary | ICD-10-CM

## 2013-09-06 DIAGNOSIS — Z23 Encounter for immunization: Secondary | ICD-10-CM

## 2013-09-06 DIAGNOSIS — K219 Gastro-esophageal reflux disease without esophagitis: Secondary | ICD-10-CM

## 2013-09-06 DIAGNOSIS — I10 Essential (primary) hypertension: Secondary | ICD-10-CM

## 2013-09-06 DIAGNOSIS — R945 Abnormal results of liver function studies: Secondary | ICD-10-CM

## 2013-09-06 DIAGNOSIS — M109 Gout, unspecified: Secondary | ICD-10-CM

## 2013-09-06 DIAGNOSIS — K2270 Barrett's esophagus without dysplasia: Secondary | ICD-10-CM

## 2013-09-06 DIAGNOSIS — Z1239 Encounter for other screening for malignant neoplasm of breast: Secondary | ICD-10-CM

## 2013-09-06 DIAGNOSIS — R7989 Other specified abnormal findings of blood chemistry: Secondary | ICD-10-CM

## 2013-09-06 DIAGNOSIS — E7800 Pure hypercholesterolemia, unspecified: Secondary | ICD-10-CM

## 2013-09-06 DIAGNOSIS — E119 Type 2 diabetes mellitus without complications: Secondary | ICD-10-CM

## 2013-09-06 DIAGNOSIS — M069 Rheumatoid arthritis, unspecified: Secondary | ICD-10-CM

## 2013-09-06 NOTE — Progress Notes (Signed)
Pre visit review using our clinic review tool, if applicable. No additional management support is needed unless otherwise documented below in the visit note. 

## 2013-09-06 NOTE — Progress Notes (Signed)
Subjective:    Patient ID: Daci Stubbe, female    DOB: [birth year], 73 y.o.   MRN: [id number]  HPI 73 year old female with past history of hypertension, inflammatory arthritis and diabetes who comes in today to follow up on these issues as well as for a complete physical exam.   She states she has been doing well.  Breathing stable.  No sinus pressure.  No fever.  No cough.  No acid reflux.  Joints stable.  Seeing Dr Jefm Bryant.   Blood sugars averaging 90s-130s.   Blood pressure averaging 120-140/60-70s.   Overall she feels she is doing well.  Trying to do better - watching her diet.       Past Medical History  Diagnosis Date  . Hypertension   . Phlebitis     x2 (with pregnancy)  . Hypercholesterolemia   . Diabetes mellitus   . Gout   . Scleroderma     raynaud's, sclerodactyly, telangiectasias  . Rheumatoid arthritis(714.0)     positive RF, FANA, RNP, negative CCP ab and anti DNA,.  neg anti-SCL 70  . Inflammatory arthritis   . Pulmonary fibrosis     mild  . Pulmonary hypertension   . Osteoarthritis     knees, spine  . Reflux esophagitis   . Barrett's esophagus   . History of colon cancer     adenomatous polyps    Current Outpatient Prescriptions on File Prior to Visit  Medication Sig Dispense Refill  . allopurinol (ZYLOPRIM) 300 MG tablet TAKE ONE TABLET BY MOUTH ONCE DAILY  30 tablet  5  . amLODipine (NORVASC) 10 MG tablet Take 1 tablet (10 mg total) by mouth daily.  30 tablet  6  . aspirin 81 MG tablet Take 81 mg by mouth daily.      . fluticasone (FLONASE) 50 MCG/ACT nasal spray Place 2 sprays into the nose daily.  16 g  1  . folic acid (FOLVITE) 1 MG tablet Take 1 mg by mouth daily.      Marland Kitchen gabapentin (NEURONTIN) 100 MG capsule Take 1 capsule (100 mg total) by mouth 2 (two) times daily.  60 capsule  2  . glucose blood test strip Contour test strips-use to test blood sugars twice a day  100 each  12  . lisinopril (PRINIVIL,ZESTRIL) 40 MG tablet Take 1 tablet (40 mg total) by  mouth daily.  30 tablet  6  . metFORMIN (GLUCOPHAGE) 500 MG tablet Take 1 tablet (500 mg total) by mouth 2 (two) times daily with a meal.  60 tablet  6  . methotrexate 2.5 MG tablet Take 6 tablets q week      . metoprolol succinate (TOPROL-XL) 50 MG 24 hr tablet TAKE ONE TABLET BY MOUTH ONCE DAILY. TAKE WITH OR IMMEDIATELY FOLLOWING A MEAL.  30 tablet  5  . naproxen (NAPROSYN) 500 MG tablet Take 500 mg by mouth 2 (two) times daily as needed.      Marland Kitchen omeprazole (PRILOSEC) 20 MG capsule Take 20 mg by mouth daily.      Marland Kitchen spironolactone (ALDACTONE) 25 MG tablet Take 1 tablet (25 mg total) by mouth daily.  30 tablet  6   No current facility-administered medications on file prior to visit.    Review of Systems Patient denies any headache, lightheadedness or dizziness.  No significant sinus or allergy symptoms.   No chest pain, tightness or palpitations.  No increased shortness of breath, cough or congestion.  No nausea or vomiting.  No  abdominal pain or cramping.  No bowel change, such as diarrhea, constipation, BRBPR or melana.  No urine change.   Joints stable.  Sugars attached.        Objective:   Physical Exam  Filed Vitals:   09/06/13 1042  BP: 130/90  Pulse: 72  Temp: 98.1 F (36.7 C)   Blood pressure recheck:  56/59  73 year old female in no acute distress.   HEENT:  Nares- clear.  Oropharynx - without lesions. NECK:  Supple.  Nontender.  No audible bruit.  HEART:  Appears to be regular. LUNGS:  No crackles or wheezing audible.  Respirations even and unlabored.  RADIAL PULSE:  Equal bilaterally.    BREASTS:  No nipple discharge or nipple retraction present.  Could not appreciate any distinct nodules or axillary adenopathy.  ABDOMEN:  Soft, nontender.  Bowel sounds present and normal.  No audible abdominal bruit.  GU:  Not performed.    EXTREMITIES:  No increased edema present.  DP pulses palpable and equal bilaterally.      FEET:  No lesions.       Assessment & Plan:   HYPOKALEMIA.  Started spironolactone 25mg  q day.  Potassium wnl now.    CARDIOVASCULAR.  Asymptomatic.  Continue risk factor modification.   HEALTH MAINTENANCE.  Physical today.    Mammogram 09/15/12 - Birads II.   Schedule f/u mammogram.  Colonoscopy as outlined.

## 2013-09-11 ENCOUNTER — Encounter: Payer: Self-pay | Admitting: Internal Medicine

## 2013-09-11 NOTE — Assessment & Plan Note (Signed)
Sees Dr Kernodle.  Doing relatively well.  On MTX.   

## 2013-09-11 NOTE — Assessment & Plan Note (Signed)
Low cholesterol diet and exercise.  Follow lipid panel.   

## 2013-09-11 NOTE — Assessment & Plan Note (Signed)
Blood pressure appears to be doing relatively well.  Same medication regimen.  Follow.  Follow metabolic panel.

## 2013-09-11 NOTE — Assessment & Plan Note (Signed)
Stable on Allopurinol.  Follow.   

## 2013-09-11 NOTE — Assessment & Plan Note (Signed)
Symptoms controlled.  Follow.  Just had EGD.

## 2013-09-11 NOTE — Assessment & Plan Note (Signed)
Up to date with colonoscopy.  Just had - 05/14/12.    

## 2013-09-11 NOTE — Assessment & Plan Note (Signed)
Low carb/diabetic diet.  Sugars attached.  Trying to do better watching her diet.  Follow.  Keep up to date with eye checks.  Follow metabolic panel and L4Y.  A1c just checked 6.3.  Improved.  Follow.

## 2013-09-11 NOTE — Assessment & Plan Note (Signed)
Currently doing well on Prilosec.  Follow.  Continues follow up with GI.   

## 2013-09-20 ENCOUNTER — Encounter: Payer: Self-pay | Admitting: *Deleted

## 2013-09-20 ENCOUNTER — Other Ambulatory Visit (INDEPENDENT_AMBULATORY_CARE_PROVIDER_SITE_OTHER): Payer: Medicare Other

## 2013-09-20 DIAGNOSIS — R7989 Other specified abnormal findings of blood chemistry: Secondary | ICD-10-CM

## 2013-09-20 DIAGNOSIS — R945 Abnormal results of liver function studies: Secondary | ICD-10-CM

## 2013-09-20 LAB — HEPATIC FUNCTION PANEL
ALBUMIN: 3.9 g/dL (ref 3.5–5.2)
ALK PHOS: 57 U/L (ref 39–117)
ALT: 27 U/L (ref 0–35)
AST: 29 U/L (ref 0–37)
Bilirubin, Direct: 0.1 mg/dL (ref 0.0–0.3)
Total Bilirubin: 0.8 mg/dL (ref 0.2–1.2)
Total Protein: 7.3 g/dL (ref 6.0–8.3)

## 2013-09-22 ENCOUNTER — Encounter: Payer: Self-pay | Admitting: *Deleted

## 2013-09-30 ENCOUNTER — Ambulatory Visit: Payer: Self-pay | Admitting: Internal Medicine

## 2013-09-30 ENCOUNTER — Encounter: Payer: Self-pay | Admitting: Internal Medicine

## 2013-09-30 LAB — HM MAMMOGRAPHY: HM MAMMO: NEGATIVE

## 2013-12-07 ENCOUNTER — Other Ambulatory Visit: Payer: Self-pay | Admitting: Internal Medicine

## 2013-12-27 ENCOUNTER — Other Ambulatory Visit: Payer: Self-pay | Admitting: Internal Medicine

## 2014-01-10 ENCOUNTER — Ambulatory Visit (INDEPENDENT_AMBULATORY_CARE_PROVIDER_SITE_OTHER): Payer: Medicare Other | Admitting: Internal Medicine

## 2014-01-10 ENCOUNTER — Encounter: Payer: Self-pay | Admitting: Internal Medicine

## 2014-01-10 VITALS — BP 120/80 | HR 74 | Temp 98.0°F | Ht 66.5 in | Wt 235.8 lb

## 2014-01-10 DIAGNOSIS — K2100 Gastro-esophageal reflux disease with esophagitis, without bleeding: Secondary | ICD-10-CM

## 2014-01-10 DIAGNOSIS — R5381 Other malaise: Secondary | ICD-10-CM

## 2014-01-10 DIAGNOSIS — M109 Gout, unspecified: Secondary | ICD-10-CM

## 2014-01-10 DIAGNOSIS — I10 Essential (primary) hypertension: Secondary | ICD-10-CM

## 2014-01-10 DIAGNOSIS — M069 Rheumatoid arthritis, unspecified: Secondary | ICD-10-CM

## 2014-01-10 DIAGNOSIS — E119 Type 2 diabetes mellitus without complications: Secondary | ICD-10-CM

## 2014-01-10 DIAGNOSIS — R5383 Other fatigue: Secondary | ICD-10-CM

## 2014-01-10 DIAGNOSIS — Z9109 Other allergy status, other than to drugs and biological substances: Secondary | ICD-10-CM

## 2014-01-10 DIAGNOSIS — Z860100 Personal history of colon polyps, unspecified: Secondary | ICD-10-CM

## 2014-01-10 DIAGNOSIS — D7589 Other specified diseases of blood and blood-forming organs: Secondary | ICD-10-CM

## 2014-01-10 DIAGNOSIS — E7800 Pure hypercholesterolemia, unspecified: Secondary | ICD-10-CM

## 2014-01-10 DIAGNOSIS — K2270 Barrett's esophagus without dysplasia: Secondary | ICD-10-CM

## 2014-01-10 LAB — HEPATIC FUNCTION PANEL
ALK PHOS: 66 U/L (ref 39–117)
ALT: 30 U/L (ref 0–35)
AST: 29 U/L (ref 0–37)
Albumin: 3.9 g/dL (ref 3.5–5.2)
Bilirubin, Direct: 0.1 mg/dL (ref 0.0–0.3)
TOTAL PROTEIN: 7.6 g/dL (ref 6.0–8.3)
Total Bilirubin: 0.7 mg/dL (ref 0.2–1.2)

## 2014-01-10 LAB — CBC WITH DIFFERENTIAL/PLATELET
BASOS PCT: 1.3 % (ref 0.0–3.0)
Basophils Absolute: 0.1 10*3/uL (ref 0.0–0.1)
Eosinophils Absolute: 0.1 10*3/uL (ref 0.0–0.7)
Eosinophils Relative: 1.6 % (ref 0.0–5.0)
HCT: 44 % (ref 36.0–46.0)
Hemoglobin: 14.4 g/dL (ref 12.0–15.0)
Lymphocytes Relative: 22.6 % (ref 12.0–46.0)
Lymphs Abs: 1.5 10*3/uL (ref 0.7–4.0)
MCHC: 32.6 g/dL (ref 30.0–36.0)
MCV: 102.3 fl — AB (ref 78.0–100.0)
MONO ABS: 0.4 10*3/uL (ref 0.1–1.0)
Monocytes Relative: 6.4 % (ref 3.0–12.0)
NEUTROS PCT: 68.1 % (ref 43.0–77.0)
Neutro Abs: 4.6 10*3/uL (ref 1.4–7.7)
PLATELETS: 224 10*3/uL (ref 150.0–400.0)
RBC: 4.31 Mil/uL (ref 3.87–5.11)
RDW: 16.5 % — ABNORMAL HIGH (ref 11.5–15.5)
WBC: 6.7 10*3/uL (ref 4.0–10.5)

## 2014-01-10 LAB — BASIC METABOLIC PANEL
BUN: 15 mg/dL (ref 6–23)
CALCIUM: 10.3 mg/dL (ref 8.4–10.5)
CO2: 32 mEq/L (ref 19–32)
CREATININE: 1 mg/dL (ref 0.4–1.2)
Chloride: 103 mEq/L (ref 96–112)
GFR: 59.89 mL/min — ABNORMAL LOW (ref >60.00)
Glucose, Bld: 103 mg/dL — ABNORMAL HIGH (ref 70–99)
Potassium: 4.3 mEq/L (ref 3.5–5.1)
Sodium: 139 mEq/L (ref 135–145)

## 2014-01-10 LAB — MICROALBUMIN / CREATININE URINE RATIO
Creatinine,U: 238 mg/dL
Microalb Creat Ratio: 4.5 mg/g (ref 0.0–30.0)
Microalb, Ur: 10.7 mg/dL — ABNORMAL HIGH (ref 0.0–1.9)

## 2014-01-10 LAB — LIPID PANEL
Cholesterol: 157 mg/dL (ref 0–200)
HDL: 33.1 mg/dL — AB (ref >39.00)
LDL Cholesterol: 90 mg/dL (ref 0–99)
NonHDL: 123.9
TRIGLYCERIDES: 172 mg/dL — AB (ref 0.0–149.0)
Total CHOL/HDL Ratio: 5
VLDL: 34.4 mg/dL (ref 0.0–40.0)

## 2014-01-10 LAB — HEMOGLOBIN A1C: HEMOGLOBIN A1C: 6.4 % (ref 4.6–6.5)

## 2014-01-10 NOTE — Progress Notes (Signed)
Subjective:    Patient ID: Nina Perez, female    DOB: 1940/05/17, 73 y.o.   MRN: [id number]  HPI 73 year old female with past history of hypertension, inflammatory arthritis and diabetes who comes in today for a scheduled follow up.  She states she has been doing well.  Breathing stable.  Some nasal congestion and drainage.  No sinus pressure.  No chest tightness.   No fever.  No significant cough.  No acid reflux.  Joints stable.  Seeing Dr Jefm Bryant.   Blood sugars averaging 80s-113 in the am and 113-116 in the pm.   She reports blood pressure has been doing well.  Was elevated this am, but this is unusual for her.  Overall she feels she is doing well.        Past Medical History  Diagnosis Date  . Hypertension   . Phlebitis     x2 (with pregnancy)  . Hypercholesterolemia   . Diabetes mellitus   . Gout   . Scleroderma     raynaud's, sclerodactyly, telangiectasias  . Rheumatoid arthritis(714.0)     positive RF, FANA, RNP, negative CCP ab and anti DNA,.  neg anti-SCL 70  . Inflammatory arthritis   . Pulmonary fibrosis     mild  . Pulmonary hypertension   . Osteoarthritis     knees, spine  . Reflux esophagitis   . Barrett's esophagus   . History of colon cancer     adenomatous polyps    Current Outpatient Prescriptions on File Prior to Visit  Medication Sig Dispense Refill  . allopurinol (ZYLOPRIM) 300 MG tablet TAKE ONE TABLET BY MOUTH ONCE DAILY  30 tablet  5  . amLODipine (NORVASC) 10 MG tablet Take 1 tablet (10 mg total) by mouth daily.  30 tablet  6  . aspirin 81 MG tablet Take 81 mg by mouth daily.      . fluticasone (FLONASE) 50 MCG/ACT nasal spray Place 2 sprays into the nose daily.  16 g  1  . folic acid (FOLVITE) 1 MG tablet Take 1 mg by mouth daily.      Marland Kitchen gabapentin (NEURONTIN) 100 MG capsule Take 1 capsule (100 mg total) by mouth 2 (two) times daily.  60 capsule  2  . glucose blood test strip Contour test strips-use to test blood sugars twice a day  100 each  12  .  lisinopril (PRINIVIL,ZESTRIL) 40 MG tablet Take 1 tablet (40 mg total) by mouth daily.  30 tablet  6  . metFORMIN (GLUCOPHAGE) 500 MG tablet TAKE ONE TABLET BY MOUTH TWICE DAILY WITH MEALS  60 tablet  5  . methotrexate 2.5 MG tablet Take 6 tablets q week      . metoprolol succinate (TOPROL-XL) 50 MG 24 hr tablet TAKE ONE TABLET BY MOUTH ONCE DAILY. TAKE WITH OR IMMEDIATELY FOLLOWING A MEAL.  30 tablet  5  . naproxen (NAPROSYN) 500 MG tablet Take 500 mg by mouth 2 (two) times daily as needed.      Marland Kitchen omeprazole (PRILOSEC) 20 MG capsule Take 20 mg by mouth daily.      Marland Kitchen spironolactone (ALDACTONE) 25 MG tablet TAKE ONE TABLET BY MOUTH ONCE DAILY  30 tablet  2   No current facility-administered medications on file prior to visit.    Review of Systems Patient denies any headache, lightheadedness or dizziness.  Increased nasal congestion and drainage as outlined.   No chest pain, tightness or palpitations.  No increased shortness of breath or  signficant cough.  No chest congestion.  No nausea or vomiting.  No abdominal pain or cramping.  No bowel change, such as diarrhea, constipation, BRBPR or melana.  No urine change.   Joints stable.  Sugars as outlined.  Blood pressure as outlined.  Has been better.  Elevated recently.  Overall feels good.       Objective:   Physical Exam  Filed Vitals:   01/10/14 0923  BP: 120/80  Pulse: 74  Temp: 98 F (36.7 C)   Blood pressure recheck: 41/90  73 year old female in no acute distress.   HEENT:  Nares- clear.  Oropharynx - without lesions. NECK:  Supple.  Nontender.  No audible bruit.  HEART:  Appears to be regular. LUNGS:  No crackles or wheezing audible.  Respirations even and unlabored.  RADIAL PULSE:  Equal bilaterally.  ABDOMEN:  Soft, nontender.  Bowel sounds present and normal.  No audible abdominal bruit.   EXTREMITIES:  No increased edema present.  DP pulses palpable and equal bilaterally.      FEET:  No lesions.       Assessment & Plan:   HYPOKALEMIA.  Started spironolactone 25mg  q day.  Potassium wnl now.    CARDIOVASCULAR.  Asymptomatic.  Continue risk factor modification.   HEALTH MAINTENANCE.  Physical 09/06/13.    Mammogram 09/30/13 - Birads I.   Colonoscopy as outlined.

## 2014-01-10 NOTE — Progress Notes (Signed)
Pre visit review using our clinic review tool, if applicable. No additional management support is needed unless otherwise documented below in the visit note. 

## 2014-01-10 NOTE — Assessment & Plan Note (Addendum)
Low carb/diabetic diet.  Sugars as outlined..  Will decrease metformin to 500mg  q day.  Follow.  Keep up to date with eye checks.  Follow metabolic panel and F5D.   Follow for lows.

## 2014-01-11 ENCOUNTER — Encounter: Payer: Self-pay | Admitting: Internal Medicine

## 2014-01-15 ENCOUNTER — Encounter: Payer: Self-pay | Admitting: Internal Medicine

## 2014-01-15 DIAGNOSIS — Z9109 Other allergy status, other than to drugs and biological substances: Secondary | ICD-10-CM | POA: Insufficient documentation

## 2014-01-15 NOTE — Assessment & Plan Note (Addendum)
Blood pressure has been controlled. Follow pressures.  Get her back in soon to reassess.   Same medication regimen.  Follow.  Follow metabolic panel.

## 2014-01-15 NOTE — Assessment & Plan Note (Signed)
Low cholesterol diet and exercise.  Follow lipid panel.   

## 2014-01-15 NOTE — Assessment & Plan Note (Signed)
Up to date with colonoscopy.  Just had - 05/14/12.

## 2014-01-15 NOTE — Assessment & Plan Note (Signed)
Currently doing well on Prilosec.  Follow.  Continues follow up with GI.

## 2014-01-15 NOTE — Assessment & Plan Note (Signed)
Sees Dr Jefm Bryant.  Doing relatively well.  On MTX.

## 2014-01-15 NOTE — Assessment & Plan Note (Signed)
Saline nasal spray and flonase nasal sray as directed.  Follow.  Notify me if problems.

## 2014-01-15 NOTE — Assessment & Plan Note (Signed)
Stable on Allopurinol.  Follow.

## 2014-01-15 NOTE — Assessment & Plan Note (Signed)
Symptoms controlled.  Follow.   

## 2014-02-14 ENCOUNTER — Other Ambulatory Visit: Payer: Self-pay | Admitting: Internal Medicine

## 2014-02-27 DIAGNOSIS — M1990 Unspecified osteoarthritis, unspecified site: Secondary | ICD-10-CM | POA: Insufficient documentation

## 2014-02-28 ENCOUNTER — Other Ambulatory Visit: Payer: Self-pay | Admitting: Internal Medicine

## 2014-02-28 ENCOUNTER — Encounter: Payer: Self-pay | Admitting: Internal Medicine

## 2014-02-28 ENCOUNTER — Ambulatory Visit (INDEPENDENT_AMBULATORY_CARE_PROVIDER_SITE_OTHER): Payer: Medicare Other | Admitting: Internal Medicine

## 2014-02-28 VITALS — BP 110/70 | HR 70 | Temp 98.1°F | Ht 66.5 in | Wt 240.0 lb

## 2014-02-28 DIAGNOSIS — E7800 Pure hypercholesterolemia, unspecified: Secondary | ICD-10-CM

## 2014-02-28 DIAGNOSIS — I10 Essential (primary) hypertension: Secondary | ICD-10-CM

## 2014-02-28 DIAGNOSIS — K2100 Gastro-esophageal reflux disease with esophagitis, without bleeding: Secondary | ICD-10-CM

## 2014-02-28 DIAGNOSIS — M069 Rheumatoid arthritis, unspecified: Secondary | ICD-10-CM

## 2014-02-28 DIAGNOSIS — E119 Type 2 diabetes mellitus without complications: Secondary | ICD-10-CM

## 2014-02-28 DIAGNOSIS — K2270 Barrett's esophagus without dysplasia: Secondary | ICD-10-CM

## 2014-02-28 DIAGNOSIS — D7589 Other specified diseases of blood and blood-forming organs: Secondary | ICD-10-CM

## 2014-02-28 DIAGNOSIS — Z23 Encounter for immunization: Secondary | ICD-10-CM

## 2014-02-28 NOTE — Progress Notes (Signed)
Subjective:    Patient ID: Nina Perez, female    DOB: [birth year], 74 y.o.   MRN: [id number]  HPI 73 year old female with past history of hypertension, inflammatory arthritis and diabetes who comes in today for a scheduled follow up.  She states she has been doing well.  Breathing stable.   No chest tightness.  No acid reflux.  Joints stable.  Seeing Dr Jefm Bryant.   Blood sugars averaging 110-130 in the am and 90-140 in the pm.   She reports blood pressure has been doing well.  Blood pressures averaging 120-130s/60-70s.      Past Medical History  Diagnosis Date  . Hypertension   . Phlebitis     x2 (with pregnancy)  . Hypercholesterolemia   . Diabetes mellitus   . Gout   . Scleroderma     raynaud's, sclerodactyly, telangiectasias  . Rheumatoid arthritis(714.0)     positive RF, FANA, RNP, negative CCP ab and anti DNA,.  neg anti-SCL 70  . Inflammatory arthritis   . Pulmonary fibrosis     mild  . Pulmonary hypertension   . Osteoarthritis     knees, spine  . Reflux esophagitis   . Barrett's esophagus   . History of colon cancer     adenomatous polyps    Current Outpatient Prescriptions on File Prior to Visit  Medication Sig Dispense Refill  . allopurinol (ZYLOPRIM) 300 MG tablet TAKE ONE TABLET BY MOUTH ONCE DAILY  30 tablet  0  . aspirin 81 MG tablet Take 81 mg by mouth daily.      . fluticasone (FLONASE) 50 MCG/ACT nasal spray Place 2 sprays into the nose daily.  16 g  1  . folic acid (FOLVITE) 1 MG tablet Take 1 mg by mouth daily.      Marland Kitchen gabapentin (NEURONTIN) 100 MG capsule Take 1 capsule (100 mg total) by mouth 2 (two) times daily.  60 capsule  2  . glucose blood test strip Contour test strips-use to test blood sugars twice a day  100 each  12  . lisinopril (PRINIVIL,ZESTRIL) 40 MG tablet Take 1 tablet (40 mg total) by mouth daily.  30 tablet  6  . methotrexate 2.5 MG tablet Take 6 tablets q week      . naproxen (NAPROSYN) 500 MG tablet Take 500 mg by mouth 2 (two) times  daily as needed.      Marland Kitchen omeprazole (PRILOSEC) 20 MG capsule Take 20 mg by mouth daily.      Marland Kitchen spironolactone (ALDACTONE) 25 MG tablet TAKE ONE TABLET BY MOUTH ONCE DAILY  30 tablet  2   No current facility-administered medications on file prior to visit.    Review of Systems Patient denies any headache, lightheadedness or dizziness.  No chest pain, tightness or palpitations.  No increased shortness of breath or cough.  No chest congestion.  No nausea or vomiting.  No abdominal pain or cramping.  No bowel change, such as diarrhea, constipation, BRBPR or melana.  No urine change.   Joints stable.  Sugars as outlined.  Blood pressure as outlined.  Overall feels good.       Objective:   Physical Exam  Filed Vitals:   02/28/14 1435  BP: 110/70  Pulse: 70  Temp: 98.1 F (36.7 C)   Blood pressure recheck: 14/71  73 year old female in no acute distress.   HEENT:  Nares- clear.  Oropharynx - without lesions. NECK:  Supple.  Nontender.  No audible bruit.  HEART:  Appears to be regular. LUNGS:  No crackles or wheezing audible.  Respirations even and unlabored.  RADIAL PULSE:  Equal bilaterally.  ABDOMEN:  Soft, nontender.  Bowel sounds present and normal.  No audible abdominal bruit.   EXTREMITIES:  No increased edema present.  DP pulses palpable and equal bilaterally.      FEET:  No lesions.       Assessment & Plan:  HYPOKALEMIA.  Started spironolactone 25mg  q day.  Potassium wnl now.    CARDIOVASCULAR.  Asymptomatic.  Continue risk factor modification.   HEALTH MAINTENANCE.  Physical 09/06/13.    Mammogram 09/30/13 - Birads I.   Colonoscopy as outlined.     Problem List Items Addressed This Visit   Barrett's esophagus     Currently doing well on Prilosec.  Follow.  Continues follow up with GI.      Diabetes mellitus     Low carb/diabetic diet.  Sugars as outlined.  On metformin 500mg  once per day now.  No lows.   Keep up to date with eye checks.  Follow metabolic panel and Y8X.            Relevant Medications      metFORMIN (GLUCOPHAGE) 500 MG tablet   GERD (gastroesophageal reflux disease)     Symptoms controlled.  Follow.       Hypercholesterolemia      Low cholesterol diet and exercise.  Follow lipid panel.    Lab Results  Component Value Date   CHOL 157 01/10/2014   HDL 33.10* 01/10/2014   LDLCALC 90 01/10/2014   TRIG 172.0* 01/10/2014   CHOLHDL 5 01/10/2014       Hypertension - Primary      Blood pressure controlled.  Same medication regimen.  Follow pressures.  Follow metabolic panel.    BP Readings from Last 3 Encounters:  02/28/14 110/70  01/10/14 120/80  09/06/13 130/90       Rheumatoid arthritis     Sees Dr Jefm Bryant.  Doing relatively well.  On MTX.

## 2014-02-28 NOTE — Progress Notes (Signed)
Pre visit review using our clinic review tool, if applicable. No additional management support is needed unless otherwise documented below in the visit note. 

## 2014-03-05 ENCOUNTER — Encounter: Payer: Self-pay | Admitting: Internal Medicine

## 2014-03-05 MED ORDER — SPIRONOLACTONE 25 MG PO TABS: 25.0000 mg | ORAL_TABLET | Freq: Every day | ORAL | Status: DC

## 2014-03-05 NOTE — Assessment & Plan Note (Signed)
Symptoms controlled.  Follow.   

## 2014-03-05 NOTE — Assessment & Plan Note (Signed)
Low carb/diabetic diet.  Sugars as outlined.  On metformin 500mg  once per day now.  No lows.   Keep up to date with eye checks.  Follow metabolic panel and P5F.

## 2014-03-05 NOTE — Assessment & Plan Note (Signed)
Currently doing well on Prilosec.  Follow.  Continues follow up with GI.

## 2014-03-05 NOTE — Assessment & Plan Note (Signed)
Low cholesterol diet and exercise.  Follow lipid panel.    Lab Results  Component Value Date   CHOL 157 01/10/2014   HDL 33.10* 01/10/2014   LDLCALC 90 01/10/2014   TRIG 172.0* 01/10/2014   CHOLHDL 5 01/10/2014

## 2014-03-05 NOTE — Assessment & Plan Note (Addendum)
Blood pressure controlled.  Same medication regimen.  Follow pressures.  Follow metabolic panel.    BP Readings from Last 3 Encounters:  02/28/14 110/70  01/10/14 120/80  09/06/13 130/90

## 2014-03-05 NOTE — Assessment & Plan Note (Signed)
Sees Dr Jefm Bryant.  Doing relatively well.  On MTX.

## 2014-03-16 ENCOUNTER — Other Ambulatory Visit: Payer: Self-pay | Admitting: Internal Medicine

## 2014-03-29 ENCOUNTER — Other Ambulatory Visit: Payer: Self-pay | Admitting: Internal Medicine

## 2014-04-11 ENCOUNTER — Other Ambulatory Visit: Payer: Self-pay | Admitting: Internal Medicine

## 2014-04-18 ENCOUNTER — Other Ambulatory Visit: Payer: Self-pay | Admitting: Internal Medicine

## 2014-04-25 ENCOUNTER — Other Ambulatory Visit: Payer: Self-pay | Admitting: Internal Medicine

## 2014-05-17 ENCOUNTER — Other Ambulatory Visit: Payer: Self-pay | Admitting: Internal Medicine

## 2014-05-29 ENCOUNTER — Other Ambulatory Visit (INDEPENDENT_AMBULATORY_CARE_PROVIDER_SITE_OTHER): Payer: Medicare Other

## 2014-05-29 DIAGNOSIS — E7800 Pure hypercholesterolemia, unspecified: Secondary | ICD-10-CM

## 2014-05-29 DIAGNOSIS — E119 Type 2 diabetes mellitus without complications: Secondary | ICD-10-CM

## 2014-05-29 DIAGNOSIS — D7589 Other specified diseases of blood and blood-forming organs: Secondary | ICD-10-CM | POA: Diagnosis not present

## 2014-05-29 DIAGNOSIS — Z79899 Other long term (current) drug therapy: Secondary | ICD-10-CM

## 2014-05-29 DIAGNOSIS — I10 Essential (primary) hypertension: Secondary | ICD-10-CM | POA: Diagnosis not present

## 2014-05-29 LAB — BASIC METABOLIC PANEL
BUN: 19 mg/dL (ref 6–23)
CO2: 27 meq/L (ref 19–32)
Calcium: 10.2 mg/dL (ref 8.4–10.5)
Chloride: 104 mEq/L (ref 96–112)
Creatinine, Ser: 0.9 mg/dL (ref 0.40–1.20)
GFR: 65.23 mL/min (ref >60.00)
GLUCOSE: 135 mg/dL — AB (ref 70–99)
Potassium: 4.6 mEq/L (ref 3.5–5.1)
Sodium: 138 mEq/L (ref 135–145)

## 2014-05-29 LAB — HEPATIC FUNCTION PANEL
ALT: 21 U/L (ref 0–35)
AST: 22 U/L (ref 0–37)
Albumin: 3.8 g/dL (ref 3.5–5.2)
Alkaline Phosphatase: 67 U/L (ref 39–117)
Bilirubin, Direct: 0.2 mg/dL (ref 0.0–0.3)
Total Bilirubin: 0.6 mg/dL (ref 0.2–1.2)
Total Protein: 7.5 g/dL (ref 6.0–8.3)

## 2014-05-29 LAB — CBC WITH DIFFERENTIAL/PLATELET
BASOS PCT: 0.7 % (ref 0.0–3.0)
Basophils Absolute: 0 10*3/uL (ref 0.0–0.1)
Eosinophils Absolute: 0.1 10*3/uL (ref 0.0–0.7)
Eosinophils Relative: 1.7 % (ref 0.0–5.0)
HCT: 45.1 % (ref 36.0–46.0)
Hemoglobin: 14.6 g/dL (ref 12.0–15.0)
Lymphocytes Relative: 24.6 % (ref 12.0–46.0)
Lymphs Abs: 1.5 10*3/uL (ref 0.7–4.0)
MCHC: 32.4 g/dL (ref 30.0–36.0)
MCV: 100.8 fl — ABNORMAL HIGH (ref 78.0–100.0)
MONO ABS: 0.4 10*3/uL (ref 0.1–1.0)
MONOS PCT: 6.3 % (ref 3.0–12.0)
Neutro Abs: 4.1 10*3/uL (ref 1.4–7.7)
Neutrophils Relative %: 66.7 % (ref 43.0–77.0)
Platelets: 195 10*3/uL (ref 150.0–400.0)
RBC: 4.48 Mil/uL (ref 3.87–5.11)
RDW: 16.1 % — ABNORMAL HIGH (ref 11.5–15.5)
WBC: 6.2 10*3/uL (ref 4.0–10.5)

## 2014-05-29 LAB — LIPID PANEL
CHOLESTEROL: 158 mg/dL (ref 0–200)
HDL: 35.3 mg/dL — AB (ref >39.00)
LDL Cholesterol: 94 mg/dL (ref 0–99)
NonHDL: 122.7
Total CHOL/HDL Ratio: 4
Triglycerides: 146 mg/dL (ref 0.0–149.0)
VLDL: 29.2 mg/dL (ref 0.0–40.0)

## 2014-05-29 LAB — VITAMIN B12: Vitamin B-12: 189 pg/mL — ABNORMAL LOW (ref 211–911)

## 2014-05-29 LAB — HEMOGLOBIN A1C: Hgb A1c MFr Bld: 6.8 % — ABNORMAL HIGH (ref 4.6–6.5)

## 2014-05-30 ENCOUNTER — Encounter: Payer: Self-pay | Admitting: Internal Medicine

## 2014-05-31 ENCOUNTER — Encounter: Payer: Self-pay | Admitting: Internal Medicine

## 2014-05-31 ENCOUNTER — Ambulatory Visit (INDEPENDENT_AMBULATORY_CARE_PROVIDER_SITE_OTHER): Payer: Medicare Other | Admitting: Internal Medicine

## 2014-05-31 VITALS — BP 138/82 | HR 75 | Temp 98.1°F | Ht 66.5 in | Wt 237.2 lb

## 2014-05-31 DIAGNOSIS — E119 Type 2 diabetes mellitus without complications: Secondary | ICD-10-CM

## 2014-05-31 DIAGNOSIS — E7800 Pure hypercholesterolemia, unspecified: Secondary | ICD-10-CM

## 2014-05-31 DIAGNOSIS — I10 Essential (primary) hypertension: Secondary | ICD-10-CM

## 2014-05-31 DIAGNOSIS — E669 Obesity, unspecified: Secondary | ICD-10-CM

## 2014-05-31 DIAGNOSIS — Z86010 Personal history of colonic polyps: Secondary | ICD-10-CM

## 2014-05-31 DIAGNOSIS — K2100 Gastro-esophageal reflux disease with esophagitis, without bleeding: Secondary | ICD-10-CM

## 2014-05-31 DIAGNOSIS — M109 Gout, unspecified: Secondary | ICD-10-CM

## 2014-05-31 DIAGNOSIS — K2270 Barrett's esophagus without dysplasia: Secondary | ICD-10-CM

## 2014-05-31 DIAGNOSIS — M069 Rheumatoid arthritis, unspecified: Secondary | ICD-10-CM

## 2014-05-31 DIAGNOSIS — E538 Deficiency of other specified B group vitamins: Secondary | ICD-10-CM

## 2014-05-31 MED ORDER — CYANOCOBALAMIN 1000 MCG/ML IJ SOLN
1000.0000 ug | Freq: Once | INTRAMUSCULAR | Status: AC
  Administered 2014-05-31: 1000 ug via INTRAMUSCULAR

## 2014-05-31 NOTE — Progress Notes (Signed)
Subjective:    Patient ID: Nina Perez, female    DOB: 21-Nov-1940, 74 y.o.   MRN: [id number]  HPI 74 year old female with past history of hypertension, inflammatory arthritis and diabetes who comes in today for a scheduled follow up. She states she has been doing well.  Breathing stable.  No chest tightness.  No acid reflux.  Joints stable.  Seeing Dr Jefm Bryant.   Blood sugars doing well.   She reports blood pressure has been doing well.  Recently found to have low B12 level.  Discussed with her today.  Start B12 injections.       Past Medical History  Diagnosis Date  . Hypertension   . Phlebitis     x2 (with pregnancy)  . Hypercholesterolemia   . Diabetes mellitus   . Gout   . Scleroderma     raynaud's, sclerodactyly, telangiectasias  . Rheumatoid arthritis(714.0)     positive RF, FANA, RNP, negative CCP ab and anti DNA,.  neg anti-SCL 70  . Inflammatory arthritis   . Pulmonary fibrosis     mild  . Pulmonary hypertension   . Osteoarthritis     knees, spine  . Reflux esophagitis   . Barrett's esophagus   . History of colon cancer     adenomatous polyps    Current Outpatient Prescriptions on File Prior to Visit  Medication Sig Dispense Refill  . allopurinol (ZYLOPRIM) 300 MG tablet TAKE ONE TABLET BY MOUTH ONCE DAILY 30 tablet 5  . amLODipine (NORVASC) 10 MG tablet TAKE ONE TABLET BY MOUTH ONCE DAILY 30 tablet 5  . aspirin 81 MG tablet Take 81 mg by mouth daily.    . fluticasone (FLONASE) 50 MCG/ACT nasal spray Place 2 sprays into the nose daily. 16 g 1  . folic acid (FOLVITE) 1 MG tablet Take 1 mg by mouth daily.    Marland Kitchen gabapentin (NEURONTIN) 100 MG capsule Take 1 capsule (100 mg total) by mouth 2 (two) times daily. 60 capsule 2  . glucose blood test strip Contour test strips-use to test blood sugars twice a day 100 each 12  . lisinopril (PRINIVIL,ZESTRIL) 40 MG tablet TAKE ONE TABLET BY MOUTH ONCE DAILY 30 tablet 0  . metFORMIN (GLUCOPHAGE) 500 MG tablet TAKE ONE TABLET BY  MOUTH ONCE DAILY WITH MEALS    . methotrexate 2.5 MG tablet Take 6 tablets q week    . metoprolol succinate (TOPROL-XL) 50 MG 24 hr tablet TAKE ONE TABLET BY MOUTH ONCE DAILY IMMEDIATELY FOLLOWING A MEAL 30 tablet 0  . naproxen (NAPROSYN) 500 MG tablet Take 500 mg by mouth 2 (two) times daily as needed.    Marland Kitchen omeprazole (PRILOSEC) 20 MG capsule Take 20 mg by mouth daily.    Marland Kitchen spironolactone (ALDACTONE) 25 MG tablet Take 1 tablet (25 mg total) by mouth daily. 30 tablet 3   No current facility-administered medications on file prior to visit.    Review of Systems Patient denies any headache, lightheadedness or dizziness.  No chest pain, tightness or palpitations.  No increased shortness of breath or cough.  No chest congestion.  No nausea or vomiting.  No abdominal pain or cramping.  No bowel change, such as diarrhea, constipation, BRBPR or melana.  No urine change.   Joints stable.  Overall feels good.  B12 low.       Objective:   Physical Exam  Filed Vitals:   05/31/14 1018  BP: 138/82  Pulse: 75  Temp: 98.1 F (36.7 C)  74 year old female in no acute distress.   HEENT:  Nares- clear.  Oropharynx - without lesions. NECK:  Supple.  Nontender.  No audible bruit.  HEART:  Appears to be regular. LUNGS:  No crackles or wheezing audible.  Respirations even and unlabored.  RADIAL PULSE:  Equal bilaterally.  ABDOMEN:  Soft, nontender.  Bowel sounds present and normal.  No audible abdominal bruit.   EXTREMITIES:  No increased edema present.  DP pulses palpable and equal bilaterally.      FEET:  No lesions.       Assessment & Plan:  1. B12 deficiency - cyanocobalamin ((VITAMIN B-12)) injection 1,000 mcg; Inject 1 mL (1,000 mcg total) into the muscle once.  2. Obesity (BMI 30-39.9) Diet and exercise.     3. Essential hypertension Blood pressure doing well.  Same medication regimen.  Follow pressures.   - Basic metabolic panel; Future  4. Gastroesophageal reflux disease with  esophagitis Controlled on prilosec.    5. Barrett's esophagus EGD 05/14/12 - esophageal mucosal changes suspicious for short segment Barretts esophagus.  Hiatal hernia.  Nodule - duodenum.    6. Type 2 diabetes mellitus without complication Discussed low carb diet and exercise.  Last a1c 6.8 (05/29/14) - Hemoglobin A1c; Future - TSH; Future  7. Rheumatoid arthritis Stable.  Followed by Dr Jefm Bryant.  On MTX.   - Hepatic function panel; Future  8. History of colon polyps Colonoscopy 05/14/12 - three polyps - recto sigmoid colon.  Diverticulosis in the sigmoid and transverse colon.   Internal hemorrhoids.    9. Hypercholesterolemia Low cholesterol diet and exercise.  Follow lipid panel.   - Lipid panel; Future  10. Gout without tophus, unspecified cause, unspecified chronicity, unspecified site Doing well.  On allopurinol.    11. HYPOKALEMIA.  Started spironolactone 25mg  q day.  Potassium wnl now.    12. CARDIOVASCULAR.  Asymptomatic.  Continue risk factor modification.   HEALTH MAINTENANCE.  Physical 09/06/13.    Mammogram 09/30/13 - Birads I.   Colonoscopy as outlined.

## 2014-05-31 NOTE — Progress Notes (Signed)
Pre visit review using our clinic review tool, if applicable. No additional management support is needed unless otherwise documented below in the visit note. 

## 2014-06-04 ENCOUNTER — Encounter: Payer: Self-pay | Admitting: Internal Medicine

## 2014-06-04 DIAGNOSIS — E669 Obesity, unspecified: Secondary | ICD-10-CM | POA: Insufficient documentation

## 2014-06-07 ENCOUNTER — Ambulatory Visit (INDEPENDENT_AMBULATORY_CARE_PROVIDER_SITE_OTHER): Payer: Medicare Other | Admitting: *Deleted

## 2014-06-07 ENCOUNTER — Telehealth: Payer: Self-pay | Admitting: *Deleted

## 2014-06-07 DIAGNOSIS — E538 Deficiency of other specified B group vitamins: Secondary | ICD-10-CM

## 2014-06-07 MED ORDER — CYANOCOBALAMIN 1000 MCG/ML IJ SOLN
1000.0000 ug | Freq: Once | INTRAMUSCULAR | Status: AC
  Administered 2014-06-07: 1000 ug via INTRAMUSCULAR

## 2014-06-07 MED ORDER — MUPIROCIN 2 % EX OINT: 1.0000 "application " | TOPICAL_OINTMENT | Freq: Two times a day (BID) | CUTANEOUS | Status: DC

## 2014-06-07 NOTE — Telephone Encounter (Signed)
rx sent in for bactroban ointment.

## 2014-06-07 NOTE — Telephone Encounter (Signed)
Left message on pts VM that Rx sent

## 2014-06-07 NOTE — Telephone Encounter (Signed)
Pt stated while here for B12 injection that she was to believe there was going to be a prescription for a cream or ointment for a sore in her nose sent to pharmacy.  However there hasn't been.  Review of chart I did not see mention of Rx.  Please advise

## 2014-06-14 ENCOUNTER — Ambulatory Visit (INDEPENDENT_AMBULATORY_CARE_PROVIDER_SITE_OTHER): Payer: Medicare Other | Admitting: *Deleted

## 2014-06-14 DIAGNOSIS — E538 Deficiency of other specified B group vitamins: Secondary | ICD-10-CM

## 2014-06-14 MED ORDER — CYANOCOBALAMIN 1000 MCG/ML IJ SOLN
1000.0000 ug | Freq: Once | INTRAMUSCULAR | Status: AC
  Administered 2014-06-14: 1000 ug via INTRAMUSCULAR

## 2014-06-17 ENCOUNTER — Other Ambulatory Visit: Payer: Self-pay | Admitting: Internal Medicine

## 2014-06-21 ENCOUNTER — Ambulatory Visit (INDEPENDENT_AMBULATORY_CARE_PROVIDER_SITE_OTHER): Payer: Medicare Other | Admitting: *Deleted

## 2014-06-21 DIAGNOSIS — E538 Deficiency of other specified B group vitamins: Secondary | ICD-10-CM

## 2014-06-21 MED ORDER — CYANOCOBALAMIN 1000 MCG/ML IJ SOLN
1000.0000 ug | Freq: Once | INTRAMUSCULAR | Status: AC
  Administered 2014-06-21: 1000 ug via INTRAMUSCULAR

## 2014-06-28 ENCOUNTER — Other Ambulatory Visit: Payer: Self-pay | Admitting: Internal Medicine

## 2014-07-20 ENCOUNTER — Other Ambulatory Visit: Payer: Self-pay | Admitting: Internal Medicine

## 2014-07-25 ENCOUNTER — Ambulatory Visit: Payer: PRIVATE HEALTH INSURANCE

## 2014-07-27 ENCOUNTER — Ambulatory Visit (INDEPENDENT_AMBULATORY_CARE_PROVIDER_SITE_OTHER): Payer: Medicare Other | Admitting: *Deleted

## 2014-07-27 DIAGNOSIS — E538 Deficiency of other specified B group vitamins: Secondary | ICD-10-CM | POA: Diagnosis not present

## 2014-07-27 MED ORDER — CYANOCOBALAMIN 1000 MCG/ML IJ SOLN
1000.0000 ug | Freq: Once | INTRAMUSCULAR | Status: AC
  Administered 2014-07-27: 1000 ug via INTRAMUSCULAR

## 2014-07-29 ENCOUNTER — Other Ambulatory Visit: Payer: Self-pay | Admitting: Internal Medicine

## 2014-08-14 ENCOUNTER — Other Ambulatory Visit: Payer: Self-pay | Admitting: Internal Medicine

## 2014-08-21 ENCOUNTER — Other Ambulatory Visit: Payer: Self-pay | Admitting: Internal Medicine

## 2014-08-29 ENCOUNTER — Ambulatory Visit (INDEPENDENT_AMBULATORY_CARE_PROVIDER_SITE_OTHER): Payer: Medicare Other | Admitting: *Deleted

## 2014-08-29 DIAGNOSIS — E538 Deficiency of other specified B group vitamins: Secondary | ICD-10-CM | POA: Diagnosis not present

## 2014-08-29 MED ORDER — CYANOCOBALAMIN 1000 MCG/ML IJ SOLN
1000.0000 ug | Freq: Once | INTRAMUSCULAR | Status: AC
  Administered 2014-08-29: 1000 ug via INTRAMUSCULAR

## 2014-08-29 NOTE — Progress Notes (Signed)
Patient presented for B 12 injection today as nurse visit patient tolerated injection well.

## 2014-09-19 ENCOUNTER — Other Ambulatory Visit: Payer: Self-pay | Admitting: Internal Medicine

## 2014-09-27 ENCOUNTER — Ambulatory Visit (INDEPENDENT_AMBULATORY_CARE_PROVIDER_SITE_OTHER): Payer: Medicare Other

## 2014-09-27 ENCOUNTER — Other Ambulatory Visit (INDEPENDENT_AMBULATORY_CARE_PROVIDER_SITE_OTHER): Payer: Medicare Other

## 2014-09-27 ENCOUNTER — Telehealth: Payer: Self-pay

## 2014-09-27 ENCOUNTER — Encounter: Payer: Self-pay | Admitting: Internal Medicine

## 2014-09-27 DIAGNOSIS — I10 Essential (primary) hypertension: Secondary | ICD-10-CM | POA: Diagnosis not present

## 2014-09-27 DIAGNOSIS — E119 Type 2 diabetes mellitus without complications: Secondary | ICD-10-CM

## 2014-09-27 DIAGNOSIS — M069 Rheumatoid arthritis, unspecified: Secondary | ICD-10-CM | POA: Diagnosis not present

## 2014-09-27 DIAGNOSIS — E538 Deficiency of other specified B group vitamins: Secondary | ICD-10-CM

## 2014-09-27 DIAGNOSIS — E7800 Pure hypercholesterolemia, unspecified: Secondary | ICD-10-CM

## 2014-09-27 LAB — BASIC METABOLIC PANEL
BUN: 17 mg/dL (ref 6–23)
CO2: 29 mEq/L (ref 19–32)
CREATININE: 0.93 mg/dL (ref 0.40–1.20)
Calcium: 10.5 mg/dL (ref 8.4–10.5)
Chloride: 103 mEq/L (ref 96–112)
GFR: 62.75 mL/min (ref >60.00)
Glucose, Bld: 117 mg/dL — ABNORMAL HIGH (ref 70–99)
POTASSIUM: 4.6 meq/L (ref 3.5–5.1)
Sodium: 137 mEq/L (ref 135–145)

## 2014-09-27 LAB — HEPATIC FUNCTION PANEL
ALBUMIN: 3.9 g/dL (ref 3.5–5.2)
ALT: 28 U/L (ref 0–35)
AST: 25 U/L (ref 0–37)
Alkaline Phosphatase: 69 U/L (ref 39–117)
BILIRUBIN DIRECT: 0.1 mg/dL (ref 0.0–0.3)
TOTAL PROTEIN: 7.5 g/dL (ref 6.0–8.3)
Total Bilirubin: 0.4 mg/dL (ref 0.2–1.2)

## 2014-09-27 LAB — LIPID PANEL
CHOLESTEROL: 165 mg/dL (ref 0–200)
HDL: 34 mg/dL — ABNORMAL LOW (ref >39.00)
LDL CALC: 92 mg/dL (ref 0–99)
NonHDL: 131
Total CHOL/HDL Ratio: 5
Triglycerides: 196 mg/dL — ABNORMAL HIGH (ref 0.0–149.0)
VLDL: 39.2 mg/dL (ref 0.0–40.0)

## 2014-09-27 LAB — HEMOGLOBIN A1C: Hgb A1c MFr Bld: 6.5 % (ref 4.6–6.5)

## 2014-09-27 LAB — TSH: TSH: 3.48 u[IU]/mL (ref 0.35–4.50)

## 2014-09-27 MED ORDER — CYANOCOBALAMIN 1000 MCG/ML IJ SOLN
1000.0000 ug | Freq: Once | INTRAMUSCULAR | Status: AC
  Administered 2014-09-27: 1000 ug via INTRAMUSCULAR

## 2014-09-27 MED ORDER — MICROLET LANCETS MISC: Status: DC

## 2014-09-27 MED ORDER — GLUCOSE BLOOD VI STRP: ORAL_STRIP | Status: DC

## 2014-09-27 NOTE — Progress Notes (Signed)
Patient came in for B12 injection.  Tolerated well.  Requested refills, see telephone note for details.

## 2014-09-29 ENCOUNTER — Encounter: Payer: Medicare Other | Admitting: Internal Medicine

## 2014-10-02 MED ORDER — MICROLET LANCETS MISC: Status: AC

## 2014-10-02 MED ORDER — GLUCOSE BLOOD VI STRP: ORAL_STRIP | Status: DC

## 2014-10-02 NOTE — Telephone Encounter (Addendum)
Fax from pharmacy, need ICD codes and specific directions. Resent Rxs correctly.

## 2014-10-02 NOTE — Addendum Note (Signed)
Addended by: Wynonia Lawman E on: 10/02/2014 10:01 AM   Modules accepted: Orders

## 2014-10-03 ENCOUNTER — Ambulatory Visit: Payer: PRIVATE HEALTH INSURANCE

## 2014-10-03 NOTE — Telephone Encounter (Signed)
Mailed unread message to pt  

## 2014-10-17 ENCOUNTER — Encounter: Payer: Medicare Other | Admitting: Internal Medicine

## 2014-10-27 ENCOUNTER — Ambulatory Visit (INDEPENDENT_AMBULATORY_CARE_PROVIDER_SITE_OTHER): Payer: Medicare Other | Admitting: Internal Medicine

## 2014-10-27 ENCOUNTER — Encounter: Payer: Self-pay | Admitting: Internal Medicine

## 2014-10-27 VITALS — BP 127/75 | HR 74 | Temp 98.1°F | Ht 65.75 in | Wt 238.2 lb

## 2014-10-27 DIAGNOSIS — N63 Unspecified lump in unspecified breast: Secondary | ICD-10-CM

## 2014-10-27 DIAGNOSIS — E7800 Pure hypercholesterolemia, unspecified: Secondary | ICD-10-CM

## 2014-10-27 DIAGNOSIS — I10 Essential (primary) hypertension: Secondary | ICD-10-CM | POA: Diagnosis not present

## 2014-10-27 DIAGNOSIS — E669 Obesity, unspecified: Secondary | ICD-10-CM

## 2014-10-27 DIAGNOSIS — Z0000 Encounter for general adult medical examination without abnormal findings: Secondary | ICD-10-CM

## 2014-10-27 DIAGNOSIS — Z86010 Personal history of colonic polyps: Secondary | ICD-10-CM

## 2014-10-27 DIAGNOSIS — E119 Type 2 diabetes mellitus without complications: Secondary | ICD-10-CM

## 2014-10-27 DIAGNOSIS — K2270 Barrett's esophagus without dysplasia: Secondary | ICD-10-CM

## 2014-10-27 DIAGNOSIS — M069 Rheumatoid arthritis, unspecified: Secondary | ICD-10-CM

## 2014-10-27 DIAGNOSIS — Z1239 Encounter for other screening for malignant neoplasm of breast: Secondary | ICD-10-CM | POA: Diagnosis not present

## 2014-10-27 NOTE — Progress Notes (Signed)
Pre visit review using our clinic review tool, if applicable. No additional management support is needed unless otherwise documented below in the visit note. 

## 2014-10-27 NOTE — Progress Notes (Signed)
Patient ID: Nina Perez, female   DOB: Apr 15, 1941, 74 y.o.   MRN: [id number]   Subjective:    Patient ID: Nina Perez, female    DOB: 1940-12-19, 74 y.o.   MRN: [id number]  HPI  Patient here to follow up on these issues as well as for her physical exam.  Blood sugars have been doing well.  Blood sugars averaging 117-118 in the am.  States highest reading - 140.  Blood pressure doing well.  States averaging 120-130/70s.  No cardiac symptoms with increased activity or exertion.  Breathing stable.  Bowels stable.  Some high left hip pain.  Discussed taking tylenol as needed.     Past Medical History  Diagnosis Date  . Hypertension   . Phlebitis     x2 (with pregnancy)  . Hypercholesterolemia   . Diabetes mellitus   . Gout   . Scleroderma     raynaud's, sclerodactyly, telangiectasias  . Rheumatoid arthritis(714.0)     positive RF, FANA, RNP, negative CCP ab and anti DNA,.  neg anti-SCL 70  . Inflammatory arthritis   . Pulmonary fibrosis     mild  . Pulmonary hypertension   . Osteoarthritis     knees, spine  . Reflux esophagitis   . Barrett's esophagus   . History of colon cancer     adenomatous polyps    Current Outpatient Prescriptions on File Prior to Visit  Medication Sig Dispense Refill  . allopurinol (ZYLOPRIM) 300 MG tablet TAKE ONE TABLET BY MOUTH ONCE DAILY 30 tablet 5  . amLODipine (NORVASC) 10 MG tablet TAKE ONE TABLET BY MOUTH ONCE DAILY 30 tablet 5  . aspirin 81 MG tablet Take 81 mg by mouth daily.    . fluticasone (FLONASE) 50 MCG/ACT nasal spray Place 2 sprays into the nose daily. 16 g 1  . folic acid (FOLVITE) 1 MG tablet Take 1 mg by mouth daily.    Marland Kitchen gabapentin (NEURONTIN) 100 MG capsule Take 1 capsule (100 mg total) by mouth 2 (two) times daily. 60 capsule 2  . glucose blood test strip Contour test strips-use to test blood sugars twice a day. Dx E11.9 100 each 12  . lisinopril (PRINIVIL,ZESTRIL) 40 MG tablet TAKE ONE TABLET BY MOUTH ONCE DAILY 30 tablet 6  .  metFORMIN (GLUCOPHAGE) 500 MG tablet TAKE ONE TABLET BY MOUTH ONCE DAILY WITH MEALS    . methotrexate 2.5 MG tablet Take 6 tablets q week    . metoprolol succinate (TOPROL-XL) 50 MG 24 hr tablet TAKE ONE TABLET BY MOUTH ONCE DAILY IMMEDIATELY FOLLOWING A MEAL 30 tablet 5  . MICROLET LANCETS MISC Check sugar once daily, [employer] Microlet Lancets. Dx E11.9 100 each 12  . mupirocin ointment (BACTROBAN) 2 % Place 1 application into the nose 2 (two) times daily. 22 g 0  . naproxen (NAPROSYN) 500 MG tablet Take 500 mg by mouth 2 (two) times daily as needed.    Marland Kitchen omeprazole (PRILOSEC) 20 MG capsule Take 20 mg by mouth daily.    Marland Kitchen spironolactone (ALDACTONE) 25 MG tablet TAKE ONE TABLET BY MOUTH ONCE DAILY 30 tablet 5   No current facility-administered medications on file prior to visit.    Review of Systems  Constitutional: Negative for appetite change and unexpected weight change.  HENT: Negative for congestion and sinus pressure.   Eyes: Negative for pain and visual disturbance.  Respiratory: Negative for cough, chest tightness and shortness of breath.   Cardiovascular: Negative for chest pain, palpitations and  leg swelling.  Gastrointestinal: Negative for nausea, vomiting, abdominal pain and diarrhea.  Genitourinary: Negative for dysuria and difficulty urinating.  Musculoskeletal: Negative for back pain and joint swelling.  Skin: Negative for color change and rash.  Neurological: Negative for dizziness, light-headedness and headaches.  Hematological: Negative for adenopathy. Does not bruise/bleed easily.  Psychiatric/Behavioral: Negative for dysphoric mood and agitation.       Objective:     Blood pressure recheck:  138/84  Physical Exam  Constitutional: She is oriented to person, place, and time. She appears well-developed and well-nourished. No distress.  HENT:  Nose: Nose normal.  Mouth/Throat: Oropharynx is clear and moist.  Eyes: Right eye exhibits no discharge. Left eye exhibits  no discharge. No scleral icterus.  Neck: Neck supple. No thyromegaly present.  Cardiovascular: Normal rate and regular rhythm.   Pulmonary/Chest: Breath sounds normal. No respiratory distress. She has no wheezes.  Breast exam reveal no nipple discharge or nipple retraction present.  Palpable nodule 9-10:00.  No axillary adenopathy.    Abdominal: Soft. Bowel sounds are normal. There is no tenderness.  Musculoskeletal: She exhibits no edema or tenderness.  Lymphadenopathy:    She has no cervical adenopathy.  Neurological: She is alert and oriented to person, place, and time.  Skin: Skin is warm. No rash noted. No erythema.  Psychiatric: She has a normal mood and affect. Her behavior is normal.    BP 127/75 mmHg  Pulse 74  Temp(Src) 98.1 F (36.7 C) (Oral)  Ht 5' 5.75" (1.67 m)  Wt 238 lb 4 oz (108.069 kg)  BMI 38.75 kg/m2  SpO2 99%  LMP 05/25/1988 Wt Readings from Last 3 Encounters:  10/27/14 238 lb 4 oz (108.069 kg)  05/31/14 237 lb 4 oz (107.616 kg)  02/28/14 240 lb (108.863 kg)     Lab Results  Component Value Date   WBC 6.2 05/29/2014   HGB 14.6 05/29/2014   HCT 45.1 05/29/2014   PLT 195.0 05/29/2014   GLUCOSE 117* 09/27/2014   CHOL 165 09/27/2014   TRIG 196.0* 09/27/2014   HDL 34.00* 09/27/2014   LDLCALC 92 09/27/2014   ALT 28 09/27/2014   AST 25 09/27/2014   NA 137 09/27/2014   K 4.6 09/27/2014   CL 103 09/27/2014   CREATININE 0.93 09/27/2014   BUN 17 09/27/2014   CO2 29 09/27/2014   TSH 3.48 09/27/2014   HGBA1C 6.5 09/27/2014   MICROALBUR 10.7* 01/10/2014       Assessment & Plan:   Problem List Items Addressed This Visit    Barrett's esophagus    EGD 05/14/12 as outlined.  Symptoms controlled.        Breast nodule - Primary    Exam as outlined.  Schedule diagnostic mammogram and ultrasound as outlined.        Diabetes mellitus    Low carb diet and exercise.  Sugars as outlined.  On metformin.  Follow met b and a1c.   Lab Results  Component Value  Date   HGBA1C 6.5 09/27/2014        Relevant Orders   Hemoglobin A1c   Microalbumin / creatinine urine ratio   Health care maintenance    Physical today 10/27/14.  Schedule diagnostic mammogram and ultrasound.  Colonoscopy 05/14/12 - three polyps, diverticulosis and internal hemorrhoids.       History of colon polyps    Colonoscopy as outlined.        Hypercholesterolemia    Low cholesterol diet and exercise.  Follow lipid  panel.   Lab Results  Component Value Date   CHOL 165 09/27/2014   HDL 34.00* 09/27/2014   LDLCALC 92 09/27/2014   TRIG 196.0* 09/27/2014   CHOLHDL 5 09/27/2014         Relevant Orders   Lipid panel   Hepatic function panel   Hypertension    Blood pressure overall doing well.  Follow pressures.  Follow metabolic panel.        Relevant Orders   Basic metabolic panel   Obesity (BMI 30-39.9)    Diet and exercise.        Rheumatoid arthritis    Followed by Dr Jefm Bryant.        Relevant Orders   CBC with Differential/Platelet    Other Visit Diagnoses    Screening breast examination          I spent 25 minutes with the patient and more than 50% of the time was spent in consultation regarding the above.     Einar Pheasant, MD

## 2014-10-31 ENCOUNTER — Ambulatory Visit: Payer: PRIVATE HEALTH INSURANCE

## 2014-11-01 ENCOUNTER — Other Ambulatory Visit: Payer: Self-pay | Admitting: Internal Medicine

## 2014-11-01 ENCOUNTER — Ambulatory Visit
Admission: RE | Admit: 2014-11-01 | Discharge: 2014-11-01 | Disposition: A | Discharge status: Home / Self Care | Payer: Medicare Other | Source: Ambulatory Visit | Attending: Internal Medicine | Admitting: Internal Medicine

## 2014-11-01 ENCOUNTER — Ambulatory Visit (INDEPENDENT_AMBULATORY_CARE_PROVIDER_SITE_OTHER): Payer: Medicare Other

## 2014-11-01 ENCOUNTER — Ambulatory Visit: Payer: PRIVATE HEALTH INSURANCE

## 2014-11-01 DIAGNOSIS — N63 Unspecified lump in unspecified breast: Secondary | ICD-10-CM

## 2014-11-01 DIAGNOSIS — E538 Deficiency of other specified B group vitamins: Secondary | ICD-10-CM

## 2014-11-01 MED ORDER — CYANOCOBALAMIN 1000 MCG/ML IJ SOLN
1000.0000 ug | Freq: Once | INTRAMUSCULAR | Status: AC
Start: 2014-11-01 — End: 2014-11-01
  Administered 2014-11-01: 1000 ug via INTRAMUSCULAR

## 2014-11-01 NOTE — Progress Notes (Signed)
Pt here for B12 injection, given right deltoid, pt tolerated well.

## 2014-11-02 ENCOUNTER — Encounter: Payer: Self-pay | Admitting: Internal Medicine

## 2014-11-02 DIAGNOSIS — Z0000 Encounter for general adult medical examination without abnormal findings: Secondary | ICD-10-CM | POA: Insufficient documentation

## 2014-11-05 ENCOUNTER — Encounter: Payer: Self-pay | Admitting: Internal Medicine

## 2014-11-05 DIAGNOSIS — N63 Unspecified lump in unspecified breast: Secondary | ICD-10-CM | POA: Insufficient documentation

## 2014-11-05 NOTE — Assessment & Plan Note (Signed)
Followed by Dr Kernodle.   

## 2014-11-05 NOTE — Assessment & Plan Note (Signed)
Low carb diet and exercise.  Sugars as outlined.  On metformin.  Follow met b and a1c.   Lab Results  Component Value Date   HGBA1C 6.5 09/27/2014

## 2014-11-05 NOTE — Assessment & Plan Note (Signed)
Exam as outlined.  Schedule diagnostic mammogram and ultrasound as outlined.

## 2014-11-05 NOTE — Assessment & Plan Note (Signed)
Blood pressure overall doing well.  Follow pressures.  Follow metabolic panel.

## 2014-11-05 NOTE — Assessment & Plan Note (Signed)
Diet and exercise.   

## 2014-11-05 NOTE — Assessment & Plan Note (Signed)
EGD 05/14/12 as outlined.  Symptoms controlled.

## 2014-11-05 NOTE — Assessment & Plan Note (Signed)
Low cholesterol diet and exercise.  Follow lipid panel.   Lab Results  Component Value Date   CHOL 165 09/27/2014   HDL 34.00* 09/27/2014   LDLCALC 92 09/27/2014   TRIG 196.0* 09/27/2014   CHOLHDL 5 09/27/2014

## 2014-11-05 NOTE — Assessment & Plan Note (Signed)
Physical today 10/27/14.  Schedule diagnostic mammogram and ultrasound.  Colonoscopy 05/14/12 - three polyps, diverticulosis and internal hemorrhoids.

## 2014-11-05 NOTE — Assessment & Plan Note (Signed)
Colonoscopy as outlined.    

## 2014-11-28 ENCOUNTER — Ambulatory Visit (INDEPENDENT_AMBULATORY_CARE_PROVIDER_SITE_OTHER): Payer: Medicare Other | Admitting: Internal Medicine

## 2014-11-28 ENCOUNTER — Encounter: Payer: Self-pay | Admitting: Internal Medicine

## 2014-11-28 VITALS — BP 126/80 | HR 66 | Temp 98.3°F | Ht 65.75 in | Wt 239.0 lb

## 2014-11-28 DIAGNOSIS — N63 Unspecified lump in unspecified breast: Secondary | ICD-10-CM

## 2014-11-28 DIAGNOSIS — I10 Essential (primary) hypertension: Secondary | ICD-10-CM

## 2014-11-28 NOTE — Progress Notes (Signed)
Pre visit review using our clinic review tool, if applicable. No additional management support is needed unless otherwise documented below in the visit note. 

## 2014-11-28 NOTE — Progress Notes (Signed)
Patient ID: Nina Perez, female   DOB: [birth year], 74 y.o.   MRN: [id number]   Subjective:    Patient ID: Nina Perez, female    DOB: Aug 10, 1940, 74 y.o.   MRN: [id number]  HPI  Patient here for a follow up breast exam.  On last visit had palpable area 9-10:00 left breast.  See last note for details.  Had recent mammogram and ultrasound of left breast.  Read as Birads I.  Here for f/u exam.  States she has felt no palpable nodule in her breast.  No change in her breast.  No nipple discharge.  No pain.  Tries to stay active.  Trying to watch her diet.  States her blood pressure has been doing well.      Past Medical History  Diagnosis Date  . Hypertension   . Phlebitis     x2 (with pregnancy)  . Hypercholesterolemia   . Diabetes mellitus   . Gout   . Scleroderma     raynaud's, sclerodactyly, telangiectasias  . Rheumatoid arthritis(714.0)     positive RF, FANA, RNP, negative CCP ab and anti DNA,.  neg anti-SCL 70  . Inflammatory arthritis   . Pulmonary fibrosis     mild  . Pulmonary hypertension   . Osteoarthritis     knees, spine  . Reflux esophagitis   . Barrett's esophagus   . History of colon cancer     adenomatous polyps    Outpatient Encounter Prescriptions as of 11/28/2014  Medication Sig  . allopurinol (ZYLOPRIM) 300 MG tablet TAKE ONE TABLET BY MOUTH ONCE DAILY  . amLODipine (NORVASC) 10 MG tablet TAKE ONE TABLET BY MOUTH ONCE DAILY  . aspirin 81 MG tablet Take 81 mg by mouth daily.  . cyanocobalamin (,VITAMIN B-12,) 1000 MCG/ML injection Inject into the muscle every 30 (thirty) days.  . fluticasone (FLONASE) 50 MCG/ACT nasal spray Place 2 sprays into the nose daily.  . folic acid (FOLVITE) 1 MG tablet Take 1 mg by mouth daily.  Marland Kitchen gabapentin (NEURONTIN) 100 MG capsule Take 1 capsule (100 mg total) by mouth 2 (two) times daily.  Marland Kitchen glucose blood test strip Contour test strips-use to test blood sugars twice a day. Dx E11.9  . lisinopril (PRINIVIL,ZESTRIL) 40 MG tablet  TAKE ONE TABLET BY MOUTH ONCE DAILY  . metFORMIN (GLUCOPHAGE) 500 MG tablet TAKE ONE TABLET BY MOUTH ONCE DAILY WITH MEALS  . methotrexate 2.5 MG tablet Take 6 tablets q week  . metoprolol succinate (TOPROL-XL) 50 MG 24 hr tablet TAKE ONE TABLET BY MOUTH ONCE DAILY IMMEDIATELY FOLLOWING A MEAL  . MICROLET LANCETS MISC Check sugar once daily, [employer] Microlet Lancets. Dx E11.9  . mupirocin ointment (BACTROBAN) 2 % Place 1 application into the nose 2 (two) times daily.  . naproxen (NAPROSYN) 500 MG tablet Take 500 mg by mouth 2 (two) times daily as needed.  Marland Kitchen omeprazole (PRILOSEC) 20 MG capsule Take 20 mg by mouth daily.  Marland Kitchen spironolactone (ALDACTONE) 25 MG tablet TAKE ONE TABLET BY MOUTH ONCE DAILY   No facility-administered encounter medications on file as of 11/28/2014.    Review of Systems  Constitutional: Negative for appetite change and unexpected weight change.  Respiratory:       No breast nodules or breast pain.  No nipple discharge.   Cardiovascular: Negative for chest pain and palpitations.  Gastrointestinal: Negative for nausea, vomiting and diarrhea.  Skin:       The previous red area left breast - essentially  resolved.  Noticed more within the last 24 hours.  No pain.  No itching.         Objective:    Physical Exam  Constitutional: She appears well-developed and well-nourished. No distress.  Neck: Neck supple.  Cardiovascular: Normal rate and regular rhythm.   Pulmonary/Chest: Breath sounds normal. No respiratory distress. She has no wheezes.  Breast exam reveals no nipple discharge or nipple retraction present.  Could not appreciate any distinct nodules or axillary adenopathy present.  Small erythematous circular area - left medial breast.  Non tender.    Abdominal: Soft. Bowel sounds are normal. There is no tenderness.  Lymphadenopathy:    She has no cervical adenopathy.    BP 126/80 mmHg  Pulse 66  Temp(Src) 98.3 F (36.8 C) (Oral)  Ht 5' 5.75" (1.67 m)  Wt  239 lb (108.41 kg)  BMI 38.87 kg/m2  SpO2 94%  LMP 05/25/1988 Wt Readings from Last 3 Encounters:  11/28/14 239 lb (108.41 kg)  10/27/14 238 lb 4 oz (108.069 kg)  05/31/14 237 lb 4 oz (107.616 kg)     Lab Results  Component Value Date   WBC 6.2 05/29/2014   HGB 14.6 05/29/2014   HCT 45.1 05/29/2014   PLT 195.0 05/29/2014   GLUCOSE 117* 09/27/2014   CHOL 165 09/27/2014   TRIG 196.0* 09/27/2014   HDL 34.00* 09/27/2014   LDLCALC 92 09/27/2014   ALT 28 09/27/2014   AST 25 09/27/2014   NA 137 09/27/2014   K 4.6 09/27/2014   CL 103 09/27/2014   CREATININE 0.93 09/27/2014   BUN 17 09/27/2014   CO2 29 09/27/2014   TSH 3.48 09/27/2014   HGBA1C 6.5 09/27/2014   MICROALBUR 10.7* 01/10/2014    US Breast Ltd Uni Left Inc Axilla  11/01/2014   CLINICAL DATA:  Questioned palpable finding per referring physician left breast 9:00 to 10:00 location  EXAM: DIGITAL DIAGNOSTIC bilateral MAMMOGRAM WITH 3D TOMOSYNTHESIS WITH CAD  ULTRASOUND left BREAST  COMPARISON:  Prior exams  ACR Breast Density Category b: There are scattered areas of fibroglandular density.  FINDINGS: No mammographic abnormality is identified in either breast. An oval circumscribed far posterior left lower inner quadrant nodule is stable over multiple prior exams dating back to at least 2011 and compatible with a benign finding.  Mammographic images were processed with CAD.  Targeted ultrasound is performed, showing no sonographic abnormality corresponding to the questioned palpable finding.  IMPRESSION: No evidence for malignancy. Any further management of the questioned palpable finding should be dictated by clinical assessment.  RECOMMENDATION: Screening mammogram in one year.(Code:SM-B-01Y)  I have discussed the findings and recommendations with the patient. Results were also provided in writing at the conclusion of the visit. If applicable, a reminder letter will be sent to the patient regarding the next appointment.  BI-RADS  CATEGORY  1: Negative.   Electronically Signed   By: Conchita Paris M.D.   On: 11/01/2014 12:01   Mm Diag Breast Tomo Bilateral  11/01/2014   CLINICAL DATA:  Questioned palpable finding per referring physician left breast 9:00 to 10:00 location  EXAM: DIGITAL DIAGNOSTIC bilateral MAMMOGRAM WITH 3D TOMOSYNTHESIS WITH CAD  ULTRASOUND left BREAST  COMPARISON:  Prior exams  ACR Breast Density Category b: There are scattered areas of fibroglandular density.  FINDINGS: No mammographic abnormality is identified in either breast. An oval circumscribed far posterior left lower inner quadrant nodule is stable over multiple prior exams dating back to at least 2011 and compatible with  a benign finding.  Mammographic images were processed with CAD.  Targeted ultrasound is performed, showing no sonographic abnormality corresponding to the questioned palpable finding.  IMPRESSION: No evidence for malignancy. Any further management of the questioned palpable finding should be dictated by clinical assessment.  RECOMMENDATION: Screening mammogram in one year.(Code:SM-B-01Y)  I have discussed the findings and recommendations with the patient. Results were also provided in writing at the conclusion of the visit. If applicable, a reminder letter will be sent to the patient regarding the next appointment.  BI-RADS CATEGORY  1: Negative.   Electronically Signed   By: Conchita Paris M.D.   On: 11/01/2014 12:01       Assessment & Plan:   Problem List Items Addressed This Visit    Breast nodule - Primary    No nodule palpated on exam.  Mammogram/ultrasound - Birads I.  She will follow.  Notify me if problems.  The previous erythematous spot - improved.  She will notify me if persistent.        Hypertension    Controlled.  Same medication regimen.            Einar Pheasant, MD

## 2014-11-29 ENCOUNTER — Encounter: Payer: Self-pay | Admitting: Internal Medicine

## 2014-11-29 ENCOUNTER — Ambulatory Visit: Payer: PRIVATE HEALTH INSURANCE

## 2014-11-29 NOTE — Assessment & Plan Note (Addendum)
No nodule palpated on exam.  Mammogram/ultrasound - Birads I.  She will follow.  Notify me if problems.  The previous erythematous spot - improved.  She will notify me if persistent.

## 2014-11-29 NOTE — Assessment & Plan Note (Signed)
Controlled.  Same medication regimen.

## 2014-12-06 ENCOUNTER — Ambulatory Visit (INDEPENDENT_AMBULATORY_CARE_PROVIDER_SITE_OTHER): Payer: Medicare Other

## 2014-12-06 DIAGNOSIS — E538 Deficiency of other specified B group vitamins: Secondary | ICD-10-CM | POA: Diagnosis not present

## 2014-12-06 MED ORDER — CYANOCOBALAMIN 1000 MCG/ML IJ SOLN
1000.0000 ug | Freq: Once | INTRAMUSCULAR | Status: AC
Start: 2014-12-06 — End: 2014-12-06
  Administered 2014-12-06: 1000 ug via INTRAMUSCULAR

## 2014-12-06 NOTE — Progress Notes (Signed)
Patient came in for nurse visit for b12 injection.  Received injection in Left deltoid.  Patient tolerated well.

## 2014-12-27 ENCOUNTER — Other Ambulatory Visit: Payer: Self-pay | Admitting: Internal Medicine

## 2015-01-09 ENCOUNTER — Other Ambulatory Visit (INDEPENDENT_AMBULATORY_CARE_PROVIDER_SITE_OTHER): Payer: Medicare Other

## 2015-01-09 ENCOUNTER — Ambulatory Visit (INDEPENDENT_AMBULATORY_CARE_PROVIDER_SITE_OTHER): Payer: Medicare Other | Admitting: *Deleted

## 2015-01-09 DIAGNOSIS — E538 Deficiency of other specified B group vitamins: Secondary | ICD-10-CM | POA: Diagnosis not present

## 2015-01-09 DIAGNOSIS — I10 Essential (primary) hypertension: Secondary | ICD-10-CM | POA: Diagnosis not present

## 2015-01-09 DIAGNOSIS — E7800 Pure hypercholesterolemia, unspecified: Secondary | ICD-10-CM

## 2015-01-09 DIAGNOSIS — E119 Type 2 diabetes mellitus without complications: Secondary | ICD-10-CM

## 2015-01-09 DIAGNOSIS — M069 Rheumatoid arthritis, unspecified: Secondary | ICD-10-CM

## 2015-01-09 LAB — HEMOGLOBIN A1C: Hgb A1c MFr Bld: 6.4 % (ref 4.6–6.5)

## 2015-01-09 LAB — LIPID PANEL
CHOLESTEROL: 155 mg/dL (ref 0–200)
HDL: 33.4 mg/dL — ABNORMAL LOW (ref >39.00)
LDL Cholesterol: 91 mg/dL (ref 0–99)
NonHDL: 121.94
TRIGLYCERIDES: 156 mg/dL — AB (ref 0.0–149.0)
Total CHOL/HDL Ratio: 5
VLDL: 31.2 mg/dL (ref 0.0–40.0)

## 2015-01-09 LAB — HEPATIC FUNCTION PANEL
ALK PHOS: 70 U/L (ref 39–117)
ALT: 27 U/L (ref 0–35)
AST: 26 U/L (ref 0–37)
Albumin: 4 g/dL (ref 3.5–5.2)
BILIRUBIN DIRECT: 0.1 mg/dL (ref 0.0–0.3)
TOTAL PROTEIN: 7.8 g/dL (ref 6.0–8.3)
Total Bilirubin: 0.5 mg/dL (ref 0.2–1.2)

## 2015-01-09 LAB — CBC WITH DIFFERENTIAL/PLATELET
BASOS PCT: 0.4 % (ref 0.0–3.0)
Basophils Absolute: 0 10*3/uL (ref 0.0–0.1)
EOS ABS: 0.1 10*3/uL (ref 0.0–0.7)
Eosinophils Relative: 1.7 % (ref 0.0–5.0)
HCT: 45.6 % (ref 36.0–46.0)
Hemoglobin: 14.7 g/dL (ref 12.0–15.0)
LYMPHS ABS: 1.3 10*3/uL (ref 0.7–4.0)
Lymphocytes Relative: 18.2 % (ref 12.0–46.0)
MCHC: 32.2 g/dL (ref 30.0–36.0)
MCV: 102.9 fl — ABNORMAL HIGH (ref 78.0–100.0)
MONO ABS: 0.5 10*3/uL (ref 0.1–1.0)
Monocytes Relative: 7.2 % (ref 3.0–12.0)
NEUTROS ABS: 5.3 10*3/uL (ref 1.4–7.7)
Neutrophils Relative %: 72.5 % (ref 43.0–77.0)
PLATELETS: 247 10*3/uL (ref 150.0–400.0)
RBC: 4.43 Mil/uL (ref 3.87–5.11)
RDW: 16.9 % — AB (ref 11.5–15.5)
WBC: 7.4 10*3/uL (ref 4.0–10.5)

## 2015-01-09 LAB — BASIC METABOLIC PANEL
BUN: 21 mg/dL (ref 6–23)
CALCIUM: 10.3 mg/dL (ref 8.4–10.5)
CO2: 28 meq/L (ref 19–32)
Chloride: 102 mEq/L (ref 96–112)
Creatinine, Ser: 0.96 mg/dL (ref 0.40–1.20)
GFR: 60.44 mL/min (ref >60.00)
GLUCOSE: 119 mg/dL — AB (ref 70–99)
POTASSIUM: 4.6 meq/L (ref 3.5–5.1)
Sodium: 136 mEq/L (ref 135–145)

## 2015-01-09 LAB — MICROALBUMIN / CREATININE URINE RATIO
Creatinine,U: 239.3 mg/dL
Microalb Creat Ratio: 3.7 mg/g (ref 0.0–30.0)
Microalb, Ur: 8.9 mg/dL — ABNORMAL HIGH (ref 0.0–1.9)

## 2015-01-09 MED ORDER — CYANOCOBALAMIN 1000 MCG/ML IJ SOLN
1000.0000 ug | Freq: Once | INTRAMUSCULAR | Status: AC
  Administered 2015-01-09: 1000 ug via INTRAMUSCULAR

## 2015-01-09 NOTE — Progress Notes (Signed)
Patient tolerated B 12 injection well in to right arm.

## 2015-01-10 ENCOUNTER — Other Ambulatory Visit: Payer: Self-pay | Admitting: Internal Medicine

## 2015-01-10 ENCOUNTER — Encounter: Payer: Self-pay | Admitting: Internal Medicine

## 2015-01-12 NOTE — Telephone Encounter (Signed)
Unread mychart message mailed to patient 

## 2015-01-29 ENCOUNTER — Other Ambulatory Visit: Payer: PRIVATE HEALTH INSURANCE

## 2015-02-01 ENCOUNTER — Ambulatory Visit (INDEPENDENT_AMBULATORY_CARE_PROVIDER_SITE_OTHER): Payer: Medicare Other | Admitting: Internal Medicine

## 2015-02-01 ENCOUNTER — Encounter: Payer: Self-pay | Admitting: Internal Medicine

## 2015-02-01 VITALS — BP 120/80 | HR 74 | Temp 98.4°F | Resp 18 | Ht 65.75 in | Wt 239.0 lb

## 2015-02-01 DIAGNOSIS — E119 Type 2 diabetes mellitus without complications: Secondary | ICD-10-CM | POA: Diagnosis not present

## 2015-02-01 DIAGNOSIS — E7800 Pure hypercholesterolemia, unspecified: Secondary | ICD-10-CM

## 2015-02-01 DIAGNOSIS — I10 Essential (primary) hypertension: Secondary | ICD-10-CM

## 2015-02-01 DIAGNOSIS — Z86010 Personal history of colonic polyps: Secondary | ICD-10-CM

## 2015-02-01 DIAGNOSIS — M069 Rheumatoid arthritis, unspecified: Secondary | ICD-10-CM

## 2015-02-01 DIAGNOSIS — L089 Local infection of the skin and subcutaneous tissue, unspecified: Secondary | ICD-10-CM | POA: Diagnosis not present

## 2015-02-01 DIAGNOSIS — K2270 Barrett's esophagus without dysplasia: Secondary | ICD-10-CM | POA: Diagnosis not present

## 2015-02-01 DIAGNOSIS — Z23 Encounter for immunization: Secondary | ICD-10-CM

## 2015-02-01 DIAGNOSIS — B351 Tinea unguium: Secondary | ICD-10-CM

## 2015-02-01 DIAGNOSIS — E669 Obesity, unspecified: Secondary | ICD-10-CM

## 2015-02-01 MED ORDER — CEPHALEXIN 500 MG PO CAPS: 500.0000 mg | ORAL_CAPSULE | Freq: Three times a day (TID) | ORAL | Status: DC

## 2015-02-01 NOTE — Progress Notes (Signed)
Patient ID: Nina Perez, female   DOB: [birth year], 74 y.o.   MRN: [id number]   Subjective:    Patient ID: Nina Perez, female    DOB: [birth year], 74 y.o.   MRN: [id number]  HPI  Patient with past history of hypertension, hypercholesterolemia and diabetes.  She comes in today to follow up on these issues.  States her sugars have been doing well.  Highest reading 144 (at night).  No cardiac symptoms with increased activity or exertion.  No sob.  No acid reflux symptoms reported.  No abdominal pain or cramping.  No urinary or bowel issues reported.  Does report infection - right great toe.  Had some drainage under the nail bed.  No fever.     Past Medical History  Diagnosis Date  . Hypertension   . Phlebitis     x2 (with pregnancy)  . Hypercholesterolemia   . Diabetes mellitus   . Gout   . Scleroderma     raynaud's, sclerodactyly, telangiectasias  . Rheumatoid arthritis(714.0)     positive RF, FANA, RNP, negative CCP ab and anti DNA,.  neg anti-SCL 70  . Inflammatory arthritis   . Pulmonary fibrosis     mild  . Pulmonary hypertension   . Osteoarthritis     knees, spine  . Reflux esophagitis   . Barrett's esophagus   . History of colon cancer     adenomatous polyps   Past Surgical History  Procedure Laterality Date  . Breast cyst aspiration Left    Family History  Problem Relation Age of Onset  . Colon cancer Neg Hx    Social History   Social History  . Marital Status: Married    Spouse Name: N/A  . Number of Children: N/A  . Years of Education: N/A   Social History Main Topics  . Smoking status: Former Smoker    Quit date: 05/12/1988  . Smokeless tobacco: Never Used  . Alcohol Use: No  . Drug Use: No  . Sexual Activity: Yes   Other Topics Concern  . None   Social History Narrative     Review of Systems  Constitutional: Negative for appetite change and unexpected weight change.  HENT: Negative for congestion and sinus pressure.   Eyes: Negative for pain  and discharge.  Respiratory: Negative for cough, chest tightness and shortness of breath.   Cardiovascular: Negative for chest pain, palpitations and leg swelling.  Gastrointestinal: Negative for nausea, vomiting, abdominal pain and diarrhea.  Genitourinary: Negative for dysuria and difficulty urinating.  Musculoskeletal: Negative for back pain and neck pain.  Skin: Negative for color change and rash.       Thickening of toenail - right great toe.  No oozing.  No surrounding erythema.    Neurological: Negative for dizziness, light-headedness and headaches.  Psychiatric/Behavioral: Negative for dysphoric mood and agitation.       Objective:     Blood pressure rechecked by me:  134/80  Physical Exam  Constitutional: She appears well-developed and well-nourished. No distress.  HENT:  Nose: Nose normal.  Mouth/Throat: Oropharynx is clear and moist.  Eyes: Conjunctivae are normal. Right eye exhibits no discharge. Left eye exhibits no discharge.  Neck: Neck supple. No thyromegaly present.  Cardiovascular: Normal rate and regular rhythm.   Pulmonary/Chest: Breath sounds normal. No respiratory distress. She has no wheezes.  Abdominal: Soft. Bowel sounds are normal. There is no tenderness.  Musculoskeletal: She exhibits no edema or tenderness.  Lymphadenopathy:    She  has no cervical adenopathy.  Skin: No rash noted. No erythema.  Thickening of the nail - right great toe.  No significant erythema.  No oozing.    Psychiatric: She has a normal mood and affect. Her behavior is normal.    BP 120/80 mmHg  Pulse 74  Temp(Src) 98.4 F (36.9 C) (Oral)  Resp 18  Ht 5' 5.75" (1.67 m)  Wt 239 lb (108.41 kg)  BMI 38.87 kg/m2  SpO2 93%  LMP 05/25/1988 Wt Readings from Last 3 Encounters:  02/01/15 239 lb (108.41 kg)  11/28/14 239 lb (108.41 kg)  10/27/14 238 lb 4 oz (108.069 kg)     Lab Results  Component Value Date   WBC 7.4 01/09/2015   HGB 14.7 01/09/2015   HCT 45.6 01/09/2015    PLT 247.0 01/09/2015   GLUCOSE 119* 01/09/2015   CHOL 155 01/09/2015   TRIG 156.0* 01/09/2015   HDL 33.40* 01/09/2015   LDLCALC 91 01/09/2015   ALT 27 01/09/2015   AST 26 01/09/2015   NA 136 01/09/2015   K 4.6 01/09/2015   CL 102 01/09/2015   CREATININE 0.96 01/09/2015   BUN 21 01/09/2015   CO2 28 01/09/2015   TSH 3.48 09/27/2014   HGBA1C 6.4 01/09/2015   MICROALBUR 8.9* 01/09/2015    US Breast Ltd Uni Left Inc Axilla  11/01/2014   CLINICAL DATA:  Questioned palpable finding per referring physician left breast 9:00 to 10:00 location  EXAM: DIGITAL DIAGNOSTIC bilateral MAMMOGRAM WITH 3D TOMOSYNTHESIS WITH CAD  ULTRASOUND left BREAST  COMPARISON:  Prior exams  ACR Breast Density Category b: There are scattered areas of fibroglandular density.  FINDINGS: No mammographic abnormality is identified in either breast. An oval circumscribed far posterior left lower inner quadrant nodule is stable over multiple prior exams dating back to at least 2011 and compatible with a benign finding.  Mammographic images were processed with CAD.  Targeted ultrasound is performed, showing no sonographic abnormality corresponding to the questioned palpable finding.  IMPRESSION: No evidence for malignancy. Any further management of the questioned palpable finding should be dictated by clinical assessment.  RECOMMENDATION: Screening mammogram in one year.(Code:SM-B-01Y)  I have discussed the findings and recommendations with the patient. Results were also provided in writing at the conclusion of the visit. If applicable, a reminder letter will be sent to the patient regarding the next appointment.  BI-RADS CATEGORY  1: Negative.   Electronically Signed   By: Conchita Paris M.D.   On: 11/01/2014 12:01   Mm Diag Breast Tomo Bilateral  11/01/2014   CLINICAL DATA:  Questioned palpable finding per referring physician left breast 9:00 to 10:00 location  EXAM: DIGITAL DIAGNOSTIC bilateral MAMMOGRAM WITH 3D TOMOSYNTHESIS WITH  CAD  ULTRASOUND left BREAST  COMPARISON:  Prior exams  ACR Breast Density Category b: There are scattered areas of fibroglandular density.  FINDINGS: No mammographic abnormality is identified in either breast. An oval circumscribed far posterior left lower inner quadrant nodule is stable over multiple prior exams dating back to at least 2011 and compatible with a benign finding.  Mammographic images were processed with CAD.  Targeted ultrasound is performed, showing no sonographic abnormality corresponding to the questioned palpable finding.  IMPRESSION: No evidence for malignancy. Any further management of the questioned palpable finding should be dictated by clinical assessment.  RECOMMENDATION: Screening mammogram in one year.(Code:SM-B-01Y)  I have discussed the findings and recommendations with the patient. Results were also provided in writing at the conclusion of the visit. If  applicable, a reminder letter will be sent to the patient regarding the next appointment.  BI-RADS CATEGORY  1: Negative.   Electronically Signed   By: Conchita Paris M.D.   On: 11/01/2014 12:01       Assessment & Plan:   Problem List Items Addressed This Visit    Barrett's esophagus    EGD as outlined in overview.  Currently asymptomatic.  Follow.        Diabetes mellitus    Sugars as outlined.  Discussed diet and exercise.  Follow met b and a1c.        Relevant Orders   Hemoglobin A1c   Fungal toenail infection    Toenail thickening.  Toenail has pulled away from nail bed.  Has been oozing.  Treat with keflex.  Refer to podiatry.        Relevant Medications   cephALEXin (KEFLEX) 500 MG capsule   History of colon polyps    Colonoscopy as outlined in overview.  Bowels doing well.        Hypercholesterolemia    Low cholesterol diet and exercise.  Follow lipid panel.   Lab Results  Component Value Date   CHOL 155 01/09/2015   HDL 33.40* 01/09/2015   LDLCALC 91 01/09/2015   TRIG 156.0* 01/09/2015    CHOLHDL 5 01/09/2015        Relevant Orders   Hepatic function panel   Lipid panel   Hypertension    Blood pressure under good control.  Continue same medication regimen.  Follow pressures.  Follow metabolic panel.         Relevant Orders   Basic metabolic panel   Obesity (BMI 30-39.9)    Diet and exercise.        Rheumatoid arthritis    Followed by Dr Jefm Bryant.  Stable.         Other Visit Diagnoses    Encounter for immunization    -  Primary    Toe infection        Relevant Medications    cephALEXin (KEFLEX) 500 MG capsule    Other Relevant Orders    Ambulatory referral to Podiatry        Einar Pheasant, MD

## 2015-02-01 NOTE — Patient Instructions (Addendum)
Influenza Virus Vaccine injection (Fluarix) What is this medicine? INFLUENZA VIRUS VACCINE (in floo EN zuh VAHY ruhs vak SEEN) helps to reduce the risk of getting influenza also known as the flu. This medicine may be used for other purposes; ask your health care Nina Perez or pharmacist if you have questions. COMMON BRAND NAME(S): Fluarix, Fluzone What should I tell my health care Nina Perez before I take this medicine? They need to know if you have any of these conditions: -bleeding disorder like hemophilia -fever or infection -Guillain-Barre syndrome or other neurological problems -immune system problems -infection with the human immunodeficiency virus (HIV) or AIDS -low blood platelet counts -multiple sclerosis -an unusual or allergic reaction to influenza virus vaccine, eggs, chicken proteins, latex, gentamicin, other medicines, foods, dyes or preservatives -pregnant or trying to get pregnant -breast-feeding How should I use this medicine? This vaccine is for injection into a muscle. It is given by a health care professional. A copy of Vaccine Information Statements will be given before each vaccination. Read this sheet carefully each time. The sheet may change frequently. Talk to your pediatrician regarding the use of this medicine in children. Special care may be needed. Overdosage: If you think you have taken too much of this medicine contact a poison control center or emergency room at once. NOTE: This medicine is only for you. Do not share this medicine with others. What if I miss a dose? This does not apply. What may interact with this medicine? -chemotherapy or radiation therapy -medicines that lower your immune system like etanercept, anakinra, infliximab, and adalimumab -medicines that treat or prevent blood clots like warfarin -phenytoin -steroid medicines like prednisone or cortisone -theophylline -vaccines This list may not describe all possible interactions. Give your  health care Nina Perez a list of all the medicines, herbs, non-prescription drugs, or dietary supplements you use. Also tell them if you smoke, drink alcohol, or use illegal drugs. Some items may interact with your medicine. What should I watch for while using this medicine? Report any side effects that do not go away within 3 days to your doctor or health care professional. Call your health care Nina Perez if any unusual symptoms occur within 6 weeks of receiving this vaccine. You may still catch the flu, but the illness is not usually as bad. You cannot get the flu from the vaccine. The vaccine will not protect against colds or other illnesses that may cause fever. The vaccine is needed every year. What side effects may I notice from receiving this medicine? Side effects that you should report to your doctor or health care professional as soon as possible: -allergic reactions like skin rash, itching or hives, swelling of the face, lips, or tongue Side effects that usually do not require medical attention (report to your doctor or health care professional if they continue or are bothersome): -fever -headache -muscle aches and pains -pain, tenderness, redness, or swelling at site where injected -weak or tired This list may not describe all possible side effects. Call your doctor for medical advice about side effects. You may report side effects to FDA at 1-800-FDA-1088. Where should I keep my medicine? This vaccine is only given in a clinic, pharmacy, doctor's office, or other health care setting and will not be stored at home. NOTE: This sheet is a summary. It may not cover all possible information. If you have questions about this medicine, talk to your doctor, pharmacist, or health care Nina Perez.  2015, Elsevier/Gold Standard. (2007-11-24 09:30:40)    Take align  one per day while on antibiotics and for two weeks after completing antibiotics.

## 2015-02-01 NOTE — Progress Notes (Signed)
Pre-visit discussion using our clinic review tool. No additional management support is needed unless otherwise documented below in the visit note.  

## 2015-02-04 ENCOUNTER — Encounter: Payer: Self-pay | Admitting: Internal Medicine

## 2015-02-04 DIAGNOSIS — B351 Tinea unguium: Secondary | ICD-10-CM | POA: Insufficient documentation

## 2015-02-04 NOTE — Assessment & Plan Note (Signed)
Colonoscopy as outlined in overview.  Bowels doing well.

## 2015-02-04 NOTE — Assessment & Plan Note (Signed)
EGD as outlined in overview.  Currently asymptomatic.  Follow.  

## 2015-02-04 NOTE — Assessment & Plan Note (Signed)
Sugars as outlined.  Discussed diet and exercise.  Follow met b and a1c.

## 2015-02-04 NOTE — Assessment & Plan Note (Signed)
Low cholesterol diet and exercise.  Follow lipid panel.   Lab Results  Component Value Date   CHOL 155 01/09/2015   HDL 33.40* 01/09/2015   LDLCALC 91 01/09/2015   TRIG 156.0* 01/09/2015   CHOLHDL 5 01/09/2015

## 2015-02-04 NOTE — Assessment & Plan Note (Signed)
Diet and exercise.   

## 2015-02-04 NOTE — Assessment & Plan Note (Signed)
Blood pressure under good control.  Continue same medication regimen.  Follow pressures.  Follow metabolic panel.   

## 2015-02-04 NOTE — Assessment & Plan Note (Signed)
Toenail thickening.  Toenail has pulled away from nail bed.  Has been oozing.  Treat with keflex.  Refer to podiatry.

## 2015-02-04 NOTE — Assessment & Plan Note (Signed)
Followed by Dr Kernodle.  Stable.  

## 2015-02-13 ENCOUNTER — Ambulatory Visit (INDEPENDENT_AMBULATORY_CARE_PROVIDER_SITE_OTHER): Payer: Medicare Other

## 2015-02-13 DIAGNOSIS — E538 Deficiency of other specified B group vitamins: Secondary | ICD-10-CM | POA: Diagnosis not present

## 2015-02-13 MED ORDER — CYANOCOBALAMIN 1000 MCG/ML IJ SOLN
1000.0000 ug | Freq: Once | INTRAMUSCULAR | Status: AC
  Administered 2015-02-13: 1000 ug via INTRAMUSCULAR

## 2015-02-13 NOTE — Progress Notes (Signed)
Patient came in for monthly b12 injection.  Received in Right deltoid.  Patient tolerated well.  

## 2015-03-13 ENCOUNTER — Other Ambulatory Visit: Payer: Self-pay | Admitting: Internal Medicine

## 2015-03-20 ENCOUNTER — Other Ambulatory Visit: Payer: Self-pay | Admitting: Internal Medicine

## 2015-03-20 ENCOUNTER — Ambulatory Visit (INDEPENDENT_AMBULATORY_CARE_PROVIDER_SITE_OTHER): Payer: Medicare Other

## 2015-03-20 DIAGNOSIS — E538 Deficiency of other specified B group vitamins: Secondary | ICD-10-CM

## 2015-03-20 MED ORDER — CYANOCOBALAMIN 1000 MCG/ML IJ SOLN
1000.0000 ug | Freq: Once | INTRAMUSCULAR | Status: AC
  Administered 2015-03-20: 1000 ug via INTRAMUSCULAR

## 2015-03-20 NOTE — Progress Notes (Signed)
Patient came in for B12 injection.  Received in Left deltoid.  Patient tolerated well.  

## 2015-04-13 ENCOUNTER — Other Ambulatory Visit: Payer: Self-pay | Admitting: Internal Medicine

## 2015-04-24 ENCOUNTER — Ambulatory Visit (INDEPENDENT_AMBULATORY_CARE_PROVIDER_SITE_OTHER): Payer: Medicare Other | Admitting: Surgical

## 2015-04-24 ENCOUNTER — Ambulatory Visit: Payer: Medicare Other

## 2015-04-24 DIAGNOSIS — E538 Deficiency of other specified B group vitamins: Secondary | ICD-10-CM

## 2015-04-24 MED ORDER — CYANOCOBALAMIN 1000 MCG/ML IJ SOLN
1000.0000 ug | Freq: Once | INTRAMUSCULAR | Status: AC
  Administered 2015-04-24: 1000 ug via INTRAMUSCULAR

## 2015-04-24 MED ORDER — CYANOCOBALAMIN 1000 MCG/ML IJ SOLN: 1000.0000 ug | Freq: Once | INTRAMUSCULAR | Status: DC

## 2015-04-24 NOTE — Progress Notes (Signed)
B 12 injection given in right deltoid. Patient tolerated well 

## 2015-05-01 ENCOUNTER — Other Ambulatory Visit (INDEPENDENT_AMBULATORY_CARE_PROVIDER_SITE_OTHER): Payer: Medicare Other

## 2015-05-01 DIAGNOSIS — E7800 Pure hypercholesterolemia, unspecified: Secondary | ICD-10-CM

## 2015-05-01 DIAGNOSIS — I10 Essential (primary) hypertension: Secondary | ICD-10-CM | POA: Diagnosis not present

## 2015-05-01 DIAGNOSIS — E119 Type 2 diabetes mellitus without complications: Secondary | ICD-10-CM

## 2015-05-01 LAB — HEMOGLOBIN A1C: Hgb A1c MFr Bld: 6.4 % (ref 4.6–6.5)

## 2015-05-01 LAB — BASIC METABOLIC PANEL
BUN: 24 mg/dL — ABNORMAL HIGH (ref 6–23)
CHLORIDE: 102 meq/L (ref 96–112)
CO2: 29 meq/L (ref 19–32)
Calcium: 10.5 mg/dL (ref 8.4–10.5)
Creatinine, Ser: 1.01 mg/dL (ref 0.40–1.20)
GFR: 56.96 mL/min — ABNORMAL LOW (ref >60.00)
GLUCOSE: 141 mg/dL — AB (ref 70–99)
Potassium: 4.7 mEq/L (ref 3.5–5.1)
SODIUM: 137 meq/L (ref 135–145)

## 2015-05-01 LAB — HEPATIC FUNCTION PANEL
ALBUMIN: 3.9 g/dL (ref 3.5–5.2)
ALT: 19 U/L (ref 0–35)
AST: 20 U/L (ref 0–37)
Alkaline Phosphatase: 64 U/L (ref 39–117)
Bilirubin, Direct: 0.1 mg/dL (ref 0.0–0.3)
Total Bilirubin: 0.5 mg/dL (ref 0.2–1.2)
Total Protein: 7.3 g/dL (ref 6.0–8.3)

## 2015-05-01 LAB — LIPID PANEL
CHOLESTEROL: 165 mg/dL (ref 0–200)
HDL: 32.3 mg/dL — ABNORMAL LOW (ref >39.00)
LDL CALC: 98 mg/dL (ref 0–99)
NonHDL: 132.77
TRIGLYCERIDES: 175 mg/dL — AB (ref 0.0–149.0)
Total CHOL/HDL Ratio: 5
VLDL: 35 mg/dL (ref 0.0–40.0)

## 2015-05-02 ENCOUNTER — Encounter: Payer: Self-pay | Admitting: Internal Medicine

## 2015-05-03 ENCOUNTER — Ambulatory Visit (INDEPENDENT_AMBULATORY_CARE_PROVIDER_SITE_OTHER): Payer: Medicare Other | Admitting: Internal Medicine

## 2015-05-03 ENCOUNTER — Encounter: Payer: Self-pay | Admitting: Internal Medicine

## 2015-05-03 VITALS — BP 132/80 | HR 65 | Temp 98.2°F | Resp 18 | Ht 65.75 in | Wt 240.2 lb

## 2015-05-03 DIAGNOSIS — E7800 Pure hypercholesterolemia, unspecified: Secondary | ICD-10-CM

## 2015-05-03 DIAGNOSIS — K2100 Gastro-esophageal reflux disease with esophagitis, without bleeding: Secondary | ICD-10-CM

## 2015-05-03 DIAGNOSIS — M069 Rheumatoid arthritis, unspecified: Secondary | ICD-10-CM

## 2015-05-03 DIAGNOSIS — E119 Type 2 diabetes mellitus without complications: Secondary | ICD-10-CM | POA: Diagnosis not present

## 2015-05-03 DIAGNOSIS — E669 Obesity, unspecified: Secondary | ICD-10-CM

## 2015-05-03 DIAGNOSIS — I10 Essential (primary) hypertension: Secondary | ICD-10-CM

## 2015-05-03 DIAGNOSIS — Z86010 Personal history of colonic polyps: Secondary | ICD-10-CM

## 2015-05-03 DIAGNOSIS — R799 Abnormal finding of blood chemistry, unspecified: Secondary | ICD-10-CM | POA: Diagnosis not present

## 2015-05-03 NOTE — Progress Notes (Signed)
Patient ID: Nina Perez, female   DOB: [birth year], 74 y.o.   MRN: [id number]   Subjective:    Patient ID: Nina Perez, female    DOB: 07/26/1940, 74 y.o.   MRN: [id number]  HPI  Patient with past history of hypercholesterolemia, diabetes, scleroderma, RA and hypertension.  She comes in today to follow up on these issues.  She reports some stiffness in her joints.  Sees Dr Jefm Bryant for her RA.  Stable.  Discussed importance of exercising.  No chest pain or tightness.  No sob.  No acid reflux reported.  No abdominal pain or cramping.  Bowels stable.     Past Medical History  Diagnosis Date  . Hypertension   . Phlebitis     x2 (with pregnancy)  . Hypercholesterolemia   . Diabetes mellitus ([place])   . Gout   . Scleroderma ([place])     raynaud's, sclerodactyly, telangiectasias  . Rheumatoid arthritis(714.0)     positive RF, FANA, RNP, negative CCP ab and anti DNA,.  neg anti-SCL 70  . Inflammatory arthritis ([place])   . Pulmonary fibrosis ([facility])     mild  . Pulmonary hypertension ([place])   . Osteoarthritis     knees, spine  . Reflux esophagitis   . Barrett's esophagus   . History of colon cancer     adenomatous polyps   Past Surgical History  Procedure Laterality Date  . Breast cyst aspiration Left    Family History  Problem Relation Age of Onset  . Colon cancer Neg Hx    Social History   Social History  . Marital Status: Married    Spouse Name: N/A  . Number of Children: N/A  . Years of Education: N/A   Social History Main Topics  . Smoking status: Former Smoker    Quit date: 05/12/1988  . Smokeless tobacco: Never Used  . Alcohol Use: No  . Drug Use: No  . Sexual Activity: Yes   Other Topics Concern  . None   Social History Narrative    Outpatient Encounter Prescriptions as of 05/03/2015  Medication Sig  . allopurinol (ZYLOPRIM) 300 MG tablet TAKE ONE TABLET BY MOUTH ONCE DAILY  . amLODipine (NORVASC) 10 MG tablet TAKE ONE TABLET BY MOUTH ONCE DAILY  . aspirin 81  MG tablet Take 81 mg by mouth daily.  . cyanocobalamin (,VITAMIN B-12,) 1000 MCG/ML injection Inject into the muscle every 30 (thirty) days.  . fluticasone (FLONASE) 50 MCG/ACT nasal spray Place 2 sprays into the nose daily.  . folic acid (FOLVITE) 1 MG tablet Take 1 mg by mouth daily.  Marland Kitchen gabapentin (NEURONTIN) 100 MG capsule Take 1 capsule (100 mg total) by mouth 2 (two) times daily.  Marland Kitchen glucose blood test strip Contour test strips-use to test blood sugars twice a day. Dx E11.9  . lisinopril (PRINIVIL,ZESTRIL) 40 MG tablet TAKE ONE TABLET BY MOUTH ONCE DAILY  . metFORMIN (GLUCOPHAGE) 500 MG tablet TAKE ONE TABLET BY MOUTH TWICE DAILY WITH MEALS  . methotrexate 2.5 MG tablet Take 6 tablets q week  . metoprolol succinate (TOPROL-XL) 50 MG 24 hr tablet TAKE ONE TABLET BY MOUTH ONCE DAILY IMMEDIATELY FOLLOWING A MEAL  . MICROLET LANCETS MISC Check sugar once daily, [employer] Microlet Lancets. Dx E11.9  . mupirocin ointment (BACTROBAN) 2 % Place 1 application into the nose 2 (two) times daily.  . naproxen (NAPROSYN) 500 MG tablet Take 500 mg by mouth 2 (two) times daily as needed.  Marland Kitchen omeprazole (PRILOSEC) 20  MG capsule Take 20 mg by mouth daily.  Marland Kitchen spironolactone (ALDACTONE) 25 MG tablet TAKE ONE TABLET BY MOUTH ONCE DAILY  . [DISCONTINUED] cephALEXin (KEFLEX) 500 MG capsule Take 1 capsule (500 mg total) by mouth 3 (three) times daily.   No facility-administered encounter medications on file as of 05/03/2015.    Review of Systems  Constitutional: Negative for appetite change and unexpected weight change.  HENT: Negative for congestion and sinus pressure.   Respiratory: Negative for cough, chest tightness and shortness of breath.   Cardiovascular: Negative for chest pain, palpitations and leg swelling.  Gastrointestinal: Negative for nausea, vomiting, abdominal pain and diarrhea.  Genitourinary: Negative for dysuria and difficulty urinating.  Musculoskeletal: Negative for back pain.       Some  joint stiffness as outlined.  RA stable.   Skin: Negative for color change and rash.  Neurological: Negative for dizziness, light-headedness and headaches.  Psychiatric/Behavioral: Negative for dysphoric mood and agitation.       Objective:     Pulse ox rechecked by me:  95%  Physical Exam  Constitutional: She appears well-developed and well-nourished. No distress.  HENT:  Nose: Nose normal.  Mouth/Throat: Oropharynx is clear and moist.  Eyes: Conjunctivae are normal. Right eye exhibits no discharge. Left eye exhibits no discharge.  Neck: Neck supple. No thyromegaly present.  Cardiovascular: Normal rate and regular rhythm.   Pulmonary/Chest: Breath sounds normal. No respiratory distress. She has no wheezes.  Abdominal: Soft. Bowel sounds are normal. There is no tenderness.  Musculoskeletal: She exhibits no edema or tenderness.  Lymphadenopathy:    She has no cervical adenopathy.  Skin: No rash noted. No erythema.  Psychiatric: She has a normal mood and affect. Her behavior is normal.    BP 132/80 mmHg  Pulse 65  Temp(Src) 98.2 F (36.8 C) (Oral)  Resp 18  Ht 5' 5.75" (1.67 m)  Wt 240 lb 4 oz (108.977 kg)  BMI 39.08 kg/m2  SpO2 92%  LMP 05/25/1988 Wt Readings from Last 3 Encounters:  05/03/15 240 lb 4 oz (108.977 kg)  02/01/15 239 lb (108.41 kg)  11/28/14 239 lb (108.41 kg)     Lab Results  Component Value Date   WBC 7.4 01/09/2015   HGB 14.7 01/09/2015   HCT 45.6 01/09/2015   PLT 247.0 01/09/2015   GLUCOSE 141* 05/01/2015   CHOL 165 05/01/2015   TRIG 175.0* 05/01/2015   HDL 32.30* 05/01/2015   LDLCALC 98 05/01/2015   ALT 19 05/01/2015   AST 20 05/01/2015   NA 137 05/01/2015   K 4.7 05/01/2015   CL 102 05/01/2015   CREATININE 1.01 05/01/2015   BUN 24* 05/01/2015   CO2 29 05/01/2015   TSH 3.48 09/27/2014   HGBA1C 6.4 05/01/2015   MICROALBUR 8.9* 01/09/2015    US Breast Ltd Uni Left Inc Axilla  11/01/2014  CLINICAL DATA:  Questioned palpable finding  per referring physician left breast 9:00 to 10:00 location EXAM: DIGITAL DIAGNOSTIC bilateral MAMMOGRAM WITH 3D TOMOSYNTHESIS WITH CAD ULTRASOUND left BREAST COMPARISON:  Prior exams ACR Breast Density Category b: There are scattered areas of fibroglandular density. FINDINGS: No mammographic abnormality is identified in either breast. An oval circumscribed far posterior left lower inner quadrant nodule is stable over multiple prior exams dating back to at least 2011 and compatible with a benign finding. Mammographic images were processed with CAD. Targeted ultrasound is performed, showing no sonographic abnormality corresponding to the questioned palpable finding. IMPRESSION: No evidence for malignancy. Any further management  of the questioned palpable finding should be dictated by clinical assessment. RECOMMENDATION: Screening mammogram in one year.(Code:SM-B-01Y) I have discussed the findings and recommendations with the patient. Results were also provided in writing at the conclusion of the visit. If applicable, a reminder letter will be sent to the patient regarding the next appointment. BI-RADS CATEGORY  1: Negative. Electronically Signed   By: Conchita Paris M.D.   On: 11/01/2014 12:01   Mm Diag Breast Tomo Bilateral  11/01/2014  CLINICAL DATA:  Questioned palpable finding per referring physician left breast 9:00 to 10:00 location EXAM: DIGITAL DIAGNOSTIC bilateral MAMMOGRAM WITH 3D TOMOSYNTHESIS WITH CAD ULTRASOUND left BREAST COMPARISON:  Prior exams ACR Breast Density Category b: There are scattered areas of fibroglandular density. FINDINGS: No mammographic abnormality is identified in either breast. An oval circumscribed far posterior left lower inner quadrant nodule is stable over multiple prior exams dating back to at least 2011 and compatible with a benign finding. Mammographic images were processed with CAD. Targeted ultrasound is performed, showing no sonographic abnormality corresponding to the  questioned palpable finding. IMPRESSION: No evidence for malignancy. Any further management of the questioned palpable finding should be dictated by clinical assessment. RECOMMENDATION: Screening mammogram in one year.(Code:SM-B-01Y) I have discussed the findings and recommendations with the patient. Results were also provided in writing at the conclusion of the visit. If applicable, a reminder letter will be sent to the patient regarding the next appointment. BI-RADS CATEGORY  1: Negative. Electronically Signed   By: Conchita Paris M.D.   On: 11/01/2014 12:01       Assessment & Plan:   Problem List Items Addressed This Visit    Diabetes mellitus ([place])    Recent a1c 6.4.  Low carb diet and exercise.  Follow met b anda a1c.        GERD (gastroesophageal reflux disease)    Symptoms controlled on omeprazole.        History of colon polyps    Colonoscopy 05/14/12 - three polyps - recto sigmoid colon.  Diverticulosis in the sigmoid and transverse colon.  Internal hemorrhoids.  Follow.        Hypercholesterolemia    Low cholesterol diet and exercise.  LDL 98.  Follow lipid panel.        Hypertension    Blood pressure as outlined.  Continue same medication regimen.  Follow pressures.  Follow metabolic panel.        Obesity (BMI 30-39.9)    Diet and exercise.  Follow.       Rheumatoid arthritis ([place])    Stable.  Followed by Dr Jefm Bryant.        Other Visit Diagnoses    Elevated BUN    -  Primary    Relevant Orders    Basic metabolic panel        Einar Pheasant, MD

## 2015-05-03 NOTE — Progress Notes (Signed)
Pre-visit discussion using our clinic review tool. No additional management support is needed unless otherwise documented below in the visit note.  

## 2015-05-04 ENCOUNTER — Encounter: Payer: Self-pay | Admitting: Internal Medicine

## 2015-05-04 NOTE — Assessment & Plan Note (Signed)
Blood pressure as outlined.  Continue same medication regimen.  Follow pressures.  Follow metabolic panel.  

## 2015-05-04 NOTE — Assessment & Plan Note (Signed)
Stable.  Followed by Dr Kernodle.   

## 2015-05-04 NOTE — Assessment & Plan Note (Signed)
Low cholesterol diet and exercise.  LDL 98.  Follow lipid panel.

## 2015-05-04 NOTE — Assessment & Plan Note (Signed)
Symptoms controlled on omeprazole.   

## 2015-05-04 NOTE — Assessment & Plan Note (Signed)
Diet and exercise.  Follow.  

## 2015-05-04 NOTE — Assessment & Plan Note (Signed)
Colonoscopy 05/14/12 - three polyps - recto sigmoid colon.  Diverticulosis in the sigmoid and transverse colon.  Internal hemorrhoids.  Follow.

## 2015-05-04 NOTE — Assessment & Plan Note (Signed)
Recent a1c 6.4.  Low carb diet and exercise.  Follow met b anda a1c.

## 2015-05-17 ENCOUNTER — Other Ambulatory Visit: Payer: Self-pay | Admitting: Internal Medicine

## 2015-05-24 ENCOUNTER — Other Ambulatory Visit: Payer: Self-pay | Admitting: Internal Medicine

## 2015-05-29 ENCOUNTER — Ambulatory Visit: Payer: Medicare Other

## 2015-05-31 ENCOUNTER — Encounter: Payer: Self-pay | Admitting: Internal Medicine

## 2015-05-31 ENCOUNTER — Other Ambulatory Visit (INDEPENDENT_AMBULATORY_CARE_PROVIDER_SITE_OTHER): Payer: Medicare Other

## 2015-05-31 ENCOUNTER — Ambulatory Visit (INDEPENDENT_AMBULATORY_CARE_PROVIDER_SITE_OTHER): Payer: Medicare Other

## 2015-05-31 ENCOUNTER — Other Ambulatory Visit: Payer: Self-pay | Admitting: Internal Medicine

## 2015-05-31 DIAGNOSIS — E538 Deficiency of other specified B group vitamins: Secondary | ICD-10-CM | POA: Diagnosis not present

## 2015-05-31 DIAGNOSIS — R799 Abnormal finding of blood chemistry, unspecified: Secondary | ICD-10-CM

## 2015-05-31 LAB — BASIC METABOLIC PANEL
BUN: 20 mg/dL (ref 6–23)
CALCIUM: 10.4 mg/dL (ref 8.4–10.5)
CHLORIDE: 101 meq/L (ref 96–112)
CO2: 30 meq/L (ref 19–32)
Creatinine, Ser: 1.06 mg/dL (ref 0.40–1.20)
GFR: 53.86 mL/min — ABNORMAL LOW (ref >60.00)
GLUCOSE: 180 mg/dL — AB (ref 70–99)
POTASSIUM: 4.6 meq/L (ref 3.5–5.1)
SODIUM: 137 meq/L (ref 135–145)

## 2015-05-31 MED ORDER — CYANOCOBALAMIN 1000 MCG/ML IJ SOLN
1000.0000 ug | Freq: Once | INTRAMUSCULAR | Status: AC
  Administered 2015-05-31: 1000 ug via INTRAMUSCULAR

## 2015-05-31 NOTE — Progress Notes (Signed)
Patient came in for B12 injection.  Received in Left Deltoid.  Patient tolerated well.

## 2015-06-06 NOTE — Telephone Encounter (Signed)
Unread mychart message mailed to patient 

## 2015-06-14 ENCOUNTER — Other Ambulatory Visit: Payer: Self-pay | Admitting: Internal Medicine

## 2015-06-29 ENCOUNTER — Other Ambulatory Visit: Payer: Self-pay | Admitting: Internal Medicine

## 2015-07-03 ENCOUNTER — Ambulatory Visit (INDEPENDENT_AMBULATORY_CARE_PROVIDER_SITE_OTHER): Payer: Medicare Other

## 2015-07-03 DIAGNOSIS — E538 Deficiency of other specified B group vitamins: Secondary | ICD-10-CM

## 2015-07-03 MED ORDER — CYANOCOBALAMIN 1000 MCG/ML IJ SOLN
1000.0000 ug | Freq: Once | INTRAMUSCULAR | Status: AC
  Administered 2015-07-03: 1000 ug via INTRAMUSCULAR

## 2015-07-03 MED ORDER — CYANOCOBALAMIN 1000 MCG/ML IJ SOLN: 1000.0000 ug | Freq: Once | INTRAMUSCULAR | Status: DC

## 2015-07-03 NOTE — Progress Notes (Signed)
Patient received a b12 injection in her right arm. Patient tolerated well.

## 2015-07-09 ENCOUNTER — Other Ambulatory Visit: Payer: Self-pay | Admitting: Internal Medicine

## 2015-07-24 ENCOUNTER — Ambulatory Visit (INDEPENDENT_AMBULATORY_CARE_PROVIDER_SITE_OTHER): Payer: Medicare Other | Admitting: Family Medicine

## 2015-07-24 ENCOUNTER — Encounter: Payer: Self-pay | Admitting: Family Medicine

## 2015-07-24 VITALS — BP 126/82 | HR 77 | Temp 98.4°F | Ht 65.75 in | Wt 244.4 lb

## 2015-07-24 DIAGNOSIS — K130 Diseases of lips: Secondary | ICD-10-CM | POA: Diagnosis not present

## 2015-07-24 NOTE — Progress Notes (Signed)
Pre visit review using our clinic review tool, if applicable. No additional management support is needed unless otherwise documented below in the visit note. 

## 2015-07-24 NOTE — Assessment & Plan Note (Addendum)
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 Advised to keep the area moist with vaseline. Offered referral to dermatology for potential cauterization and patient declined. Follow-up with PCP closely.

## 2015-07-24 NOTE — Progress Notes (Signed)
   Subjective:  Patient ID: Nina Perez, female    DOB: 25-Jul-1940  Age: 75 y.o. MRN: [id number]  CC: Bleeding from lip  HPI:  75 year old female with a past medical history of rheumatoid arthritis, DM 2, hypertension presents with the above complaint.  Bleeding from lip  Patient reports that she has a history of "red dots" on her lip as well as her fingers. She was told by rheumatologist that this was from her arthritis. 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  She states that the past month she's had intermittent bleeding of a small area on her lower lip.  Past several days it is been bleeding more frequently.  She reports that it is mildly sore.  She has used a styptic pencil to help with the bleeding.  She is concerned about this and the fact that it bleeds significantly.  It is exacerbated by talking/eating/etc.  No known relieving factors.  No other complaints at this time.  Social Hx   Social History   Social History  . Marital Status: Married    Spouse Name: N/A  . Number of Children: N/A  . Years of Education: N/A   Social History Main Topics  . Smoking status: Former Smoker    Quit date: 05/12/1988  . Smokeless tobacco: Never Used  . Alcohol Use: No  . Drug Use: No  . Sexual Activity: Yes   Other Topics Concern  . None   Social History Narrative   Review of Systems  Constitutional: Negative.   HENT:       Bleeding from lip.   Objective:  BP 126/82 mmHg  Pulse 77  Temp(Src) 98.4 F (36.9 C) (Oral)  Ht 5' 5.75" (1.67 m)  Wt 244 lb 6 oz (110.848 kg)  BMI 39.75 kg/m2  SpO2 91%  LMP 05/25/1988  BP/Weight 07/24/2015 05/03/2015 0000000  Systolic BP [id number]  Diastolic BP 82 80 80  Wt. (Lbs) 244.38 240.25 239  BMI 39.75 39.08 38.87   Physical Exam  Constitutional: She is oriented to person, place, and time. She appears well-developed. No distress.  HENT:  Oropharynx clear. Mild slight ectasia is noted on the lower lip.  There is a central small area with eschar.  Pulmonary/Chest: Effort normal.  Neurological: She is alert and oriented to person, place, and time.  Psychiatric: She has a normal mood and affect.  Vitals reviewed.  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 Advised to keep the area moist with vaseline. Offered referral to dermatology for potential cauterization and patient declined. Follow-up with PCP closely.        Follow-up: [facility]  [place]

## 2015-08-02 ENCOUNTER — Ambulatory Visit (INDEPENDENT_AMBULATORY_CARE_PROVIDER_SITE_OTHER): Payer: Medicare Other

## 2015-08-02 DIAGNOSIS — E538 Deficiency of other specified B group vitamins: Secondary | ICD-10-CM | POA: Diagnosis not present

## 2015-08-02 MED ORDER — CYANOCOBALAMIN 1000 MCG/ML IJ SOLN
1000.0000 ug | Freq: Once | INTRAMUSCULAR | Status: AC
  Administered 2015-08-02: 1000 ug via INTRAMUSCULAR

## 2015-08-02 NOTE — Progress Notes (Signed)
Patient came in for b12 injection.  Received in Left deltoid.  Patient tolerated well  

## 2015-08-16 ENCOUNTER — Other Ambulatory Visit: Payer: Self-pay | Admitting: Internal Medicine

## 2015-08-29 ENCOUNTER — Telehealth: Payer: Self-pay | Admitting: *Deleted

## 2015-08-29 ENCOUNTER — Other Ambulatory Visit (INDEPENDENT_AMBULATORY_CARE_PROVIDER_SITE_OTHER): Payer: Medicare Other

## 2015-08-29 DIAGNOSIS — E7800 Pure hypercholesterolemia, unspecified: Secondary | ICD-10-CM

## 2015-08-29 DIAGNOSIS — I10 Essential (primary) hypertension: Secondary | ICD-10-CM | POA: Diagnosis not present

## 2015-08-29 DIAGNOSIS — E119 Type 2 diabetes mellitus without complications: Secondary | ICD-10-CM

## 2015-08-29 LAB — BASIC METABOLIC PANEL
BUN: 17 mg/dL (ref 6–23)
CHLORIDE: 103 meq/L (ref 96–112)
CO2: 30 meq/L (ref 19–32)
CREATININE: 1.05 mg/dL (ref 0.40–1.20)
Calcium: 10.5 mg/dL (ref 8.4–10.5)
GFR: 54.41 mL/min — ABNORMAL LOW (ref >60.00)
Glucose, Bld: 127 mg/dL — ABNORMAL HIGH (ref 70–99)
Potassium: 4.5 mEq/L (ref 3.5–5.1)
Sodium: 139 mEq/L (ref 135–145)

## 2015-08-29 LAB — CBC WITH DIFFERENTIAL/PLATELET
BASOS ABS: 0.1 10*3/uL (ref 0.0–0.1)
Basophils Relative: 1 % (ref 0.0–3.0)
Eosinophils Absolute: 0.1 10*3/uL (ref 0.0–0.7)
Eosinophils Relative: 1.4 % (ref 0.0–5.0)
HCT: 44.2 % (ref 36.0–46.0)
Hemoglobin: 14.4 g/dL (ref 12.0–15.0)
LYMPHS ABS: 1.2 10*3/uL (ref 0.7–4.0)
Lymphocytes Relative: 19.4 % (ref 12.0–46.0)
MCHC: 32.6 g/dL (ref 30.0–36.0)
MCV: 100.9 fl — AB (ref 78.0–100.0)
MONO ABS: 0.6 10*3/uL (ref 0.1–1.0)
MONOS PCT: 9.8 % (ref 3.0–12.0)
NEUTROS ABS: 4.2 10*3/uL (ref 1.4–7.7)
NEUTROS PCT: 68.4 % (ref 43.0–77.0)
PLATELETS: 205 10*3/uL (ref 150.0–400.0)
RBC: 4.38 Mil/uL (ref 3.87–5.11)
RDW: 17.7 % — ABNORMAL HIGH (ref 11.5–15.5)
WBC: 6.2 10*3/uL (ref 4.0–10.5)

## 2015-08-29 LAB — HEPATIC FUNCTION PANEL
ALT: 21 U/L (ref 0–35)
AST: 22 U/L (ref 0–37)
Albumin: 4.1 g/dL (ref 3.5–5.2)
Alkaline Phosphatase: 66 U/L (ref 39–117)
BILIRUBIN TOTAL: 0.6 mg/dL (ref 0.2–1.2)
Bilirubin, Direct: 0.1 mg/dL (ref 0.0–0.3)
Total Protein: 7.2 g/dL (ref 6.0–8.3)

## 2015-08-29 LAB — LIPID PANEL
Cholesterol: 167 mg/dL (ref 0–200)
HDL: 35.8 mg/dL — AB (ref >39.00)
LDL Cholesterol: 99 mg/dL (ref 0–99)
NONHDL: 130.86
Total CHOL/HDL Ratio: 5
Triglycerides: 161 mg/dL — ABNORMAL HIGH (ref 0.0–149.0)
VLDL: 32.2 mg/dL (ref 0.0–40.0)

## 2015-08-29 LAB — HEMOGLOBIN A1C: Hgb A1c MFr Bld: 6.6 % — ABNORMAL HIGH (ref 4.6–6.5)

## 2015-08-29 NOTE — Telephone Encounter (Signed)
Labs and dx?  

## 2015-08-29 NOTE — Telephone Encounter (Signed)
Lab orders placed.  

## 2015-08-30 ENCOUNTER — Encounter: Payer: Self-pay | Admitting: Internal Medicine

## 2015-09-03 ENCOUNTER — Ambulatory Visit: Payer: Medicare Other | Admitting: Internal Medicine

## 2015-09-04 ENCOUNTER — Ambulatory Visit (INDEPENDENT_AMBULATORY_CARE_PROVIDER_SITE_OTHER): Payer: Medicare Other | Admitting: Internal Medicine

## 2015-09-04 ENCOUNTER — Encounter: Payer: Self-pay | Admitting: Internal Medicine

## 2015-09-04 VITALS — BP 138/80 | HR 84 | Temp 98.2°F | Ht 65.75 in | Wt 241.8 lb

## 2015-09-04 DIAGNOSIS — M069 Rheumatoid arthritis, unspecified: Secondary | ICD-10-CM | POA: Diagnosis not present

## 2015-09-04 DIAGNOSIS — I10 Essential (primary) hypertension: Secondary | ICD-10-CM

## 2015-09-04 DIAGNOSIS — E7800 Pure hypercholesterolemia, unspecified: Secondary | ICD-10-CM

## 2015-09-04 DIAGNOSIS — E119 Type 2 diabetes mellitus without complications: Secondary | ICD-10-CM

## 2015-09-04 DIAGNOSIS — E669 Obesity, unspecified: Secondary | ICD-10-CM

## 2015-09-04 DIAGNOSIS — E538 Deficiency of other specified B group vitamins: Secondary | ICD-10-CM

## 2015-09-04 DIAGNOSIS — I8311 Varicose veins of right lower extremity with inflammation: Secondary | ICD-10-CM

## 2015-09-04 DIAGNOSIS — I872 Venous insufficiency (chronic) (peripheral): Secondary | ICD-10-CM

## 2015-09-04 MED ORDER — CYANOCOBALAMIN 1000 MCG/ML IJ SOLN
1000.0000 ug | Freq: Once | INTRAMUSCULAR | Status: AC
  Administered 2015-09-04: 1000 ug via INTRAMUSCULAR

## 2015-09-04 NOTE — Progress Notes (Signed)
Patient ID: Nina Perez, female   DOB: 1940/09/17, 76 y.o.   MRN: [id number]   Subjective:    Patient ID: Nina Perez, female    DOB: [birth year], 75 y.o.   MRN: [id number]  HPI  Patient here for a scheduled follow up.  She states she is doing well.  Feels good.  Discussed diet and exercise.  Discussed recent lab results.  a1c 6.6.  LDL 99.  Triglycerides improved some.  No chest pain or tightness.  No sob.  No acid reflux.  No abdominal pain or cramping.  Bowels stable.  Persistent area of redness - right lower leg.   Past Medical History  Diagnosis Date  . Hypertension   . Phlebitis     x2 (with pregnancy)  . Hypercholesterolemia   . Diabetes mellitus ([place])   . Gout   . 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 Rheumatoid arthritis(714.0)     positive RF, FANA, RNP, negative CCP ab and anti DNA,.  neg anti-SCL 70  . 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 Pulmonary fibrosis ([facility])     mild  . Pulmonary hypertension ([place])   . Osteoarthritis     knees, spine  . Reflux esophagitis   . Barrett's esophagus   . History of colon cancer     adenomatous polyps   Past Surgical History  Procedure Laterality Date  . Breast cyst aspiration Left    Family History  Problem Relation Age of Onset  . Colon cancer Neg Hx    Social History   Social History  . Marital Status: Married    Spouse Name: N/A  . Number of Children: N/A  . Years of Education: N/A   Social History Main Topics  . Smoking status: Former Smoker    Quit date: 05/12/1988  . Smokeless tobacco: Never Used  . Alcohol Use: No  . Drug Use: No  . Sexual Activity: Yes   Other Topics Concern  . None   Social History Narrative    Outpatient Encounter Prescriptions as of 09/04/2015  Medication Sig  . allopurinol (ZYLOPRIM) 300 MG tablet TAKE ONE TABLET BY MOUTH ONCE DAILY  . amLODipine (NORVASC) 10 MG tablet TAKE ONE TABLET BY MOUTH ONCE DAILY  . aspirin 81 MG tablet Take 81 mg by mouth daily.  Reported on 07/24/2015  . fluticasone (FLONASE) 50 MCG/ACT nasal spray Place 2 sprays into the nose daily.  . folic acid (FOLVITE) 1 MG tablet Take 1 mg by mouth daily.  Marland Kitchen gabapentin (NEURONTIN) 100 MG capsule Take 1 capsule (100 mg total) by mouth 2 (two) times daily.  Marland Kitchen glucose blood test strip Contour test strips-use to test blood sugars twice a day. Dx E11.9  . lisinopril (PRINIVIL,ZESTRIL) 40 MG tablet TAKE ONE TABLET BY MOUTH ONCE DAILY  . metFORMIN (GLUCOPHAGE) 500 MG tablet TAKE ONE TABLET BY MOUTH TWICE DAILY WITH MEALS (Patient taking differently: TAKE ONE TABLET BY MOUTH ONCE  DAILY WITH MEALS)  . methotrexate 2.5 MG tablet Take 6 tablets q week  . metoprolol succinate (TOPROL-XL) 50 MG 24 hr tablet TAKE ONE TABLET BY MOUTH ONCE DAILY IMMEDIATELY FOLLOWING A MEAL  . MICROLET LANCETS MISC Check sugar once daily, [employer] Microlet Lancets. Dx E11.9  . mupirocin ointment (BACTROBAN) 2 % Place 1 application into the nose 2 (two) times daily.  . naproxen (NAPROSYN) 500 MG tablet Take 500 mg by mouth 2 (two) times daily as needed.  Marland Kitchen omeprazole (PRILOSEC) 20 MG  capsule Take 20 mg by mouth daily.  Marland Kitchen spironolactone (ALDACTONE) 25 MG tablet TAKE ONE TABLET BY MOUTH ONCE DAILY  . [DISCONTINUED] cyanocobalamin (,VITAMIN B-12,) 1000 MCG/ML injection Inject into the muscle every 30 (thirty) days.  . [EXPIRED] cyanocobalamin ((VITAMIN B-12)) injection 1,000 mcg    No facility-administered encounter medications on file as of 09/04/2015.    Review of Systems  Constitutional: Negative for appetite change and unexpected weight change.  HENT: Negative for congestion and sinus pressure.   Respiratory: Negative for cough, chest tightness and shortness of breath.   Cardiovascular: Negative for chest pain and palpitations.  Gastrointestinal: Negative for nausea, vomiting, abdominal pain and diarrhea.  Genitourinary: Negative for dysuria and difficulty urinating.  Musculoskeletal: Negative for myalgias  and back pain.  Skin:       Lower extremity erythema.    Neurological: Negative for dizziness, light-headedness and headaches.  Psychiatric/Behavioral: Negative for dysphoric mood and agitation.       Objective:    Physical Exam  Constitutional: She appears well-developed and well-nourished. No distress.  HENT:  Nose: Nose normal.  Mouth/Throat: Oropharynx is clear and moist.  Neck: Neck supple. No thyromegaly present.  Cardiovascular: Normal rate and regular rhythm.   Pulmonary/Chest: Breath sounds normal. No respiratory distress. She has no wheezes.  Abdominal: Soft. Bowel sounds are normal. There is no tenderness.  Musculoskeletal:  Minimal lower extremity swelling.  Minimal tenderness to palpation over the area of erythema.   Lymphadenopathy:    She has no cervical adenopathy.  Skin:  Erythema - right lower extremity.  Some stasis changes.   Psychiatric: She has a normal mood and affect. Her behavior is normal.    BP 138/80 mmHg  Pulse 84  Temp(Src) 98.2 F (36.8 C) (Oral)  Ht 5' 5.75" (1.67 m)  Wt 241 lb 12.8 oz (109.68 kg)  BMI 39.33 kg/m2  SpO2 96%  LMP 05/25/1988 Wt Readings from Last 3 Encounters:  09/04/15 241 lb 12.8 oz (109.68 kg)  07/24/15 244 lb 6 oz (110.848 kg)  05/03/15 240 lb 4 oz (108.977 kg)     Lab Results  Component Value Date   WBC 6.2 08/29/2015   HGB 14.4 08/29/2015   HCT 44.2 08/29/2015   PLT 205.0 08/29/2015   GLUCOSE 127* 08/29/2015   CHOL 167 08/29/2015   TRIG 161.0* 08/29/2015   HDL 35.80* 08/29/2015   LDLCALC 99 08/29/2015   ALT 21 08/29/2015   AST 22 08/29/2015   NA 139 08/29/2015   K 4.5 08/29/2015   CL 103 08/29/2015   CREATININE 1.05 08/29/2015   BUN 17 08/29/2015   CO2 30 08/29/2015   TSH 3.48 09/27/2014   HGBA1C 6.6* 08/29/2015   MICROALBUR 8.9* 01/09/2015    [place]  11/01/2014  CLINICAL DATA:  Questioned palpable finding per referring physician left breast 9:00 to 10:00 location EXAM:  DIGITAL DIAGNOSTIC bilateral MAMMOGRAM WITH 3D TOMOSYNTHESIS WITH CAD ULTRASOUND left BREAST COMPARISON:  Prior exams ACR Breast Density Category b: There are scattered areas of fibroglandular density. FINDINGS: No mammographic abnormality is identified in either breast. An oval circumscribed far posterior left lower inner quadrant nodule is stable over multiple prior exams dating back to at least 2011 and compatible with a benign finding. Mammographic images were processed with CAD. Targeted ultrasound is performed, showing no sonographic abnormality corresponding to the questioned palpable finding. IMPRESSION: No evidence for malignancy. Any further management of the questioned palpable finding should be dictated by clinical assessment.  RECOMMENDATION: Screening mammogram in one year.(Code:SM-B-01Y) I have discussed the findings and recommendations with the patient. Results were also provided in writing at the conclusion of the visit. If applicable, a reminder letter will be sent to the patient regarding the next appointment. BI-RADS CATEGORY  1: Negative. Electronically Signed   By: Conchita Paris M.D.   On: 11/01/2014 12:01   Mm Diag Breast Tomo Bilateral  11/01/2014  CLINICAL DATA:  Questioned palpable finding per referring physician left breast 9:00 to 10:00 location EXAM: DIGITAL DIAGNOSTIC bilateral MAMMOGRAM WITH 3D TOMOSYNTHESIS WITH CAD ULTRASOUND left BREAST COMPARISON:  Prior exams ACR Breast Density Category b: There are scattered areas of fibroglandular density. FINDINGS: No mammographic abnormality is identified in either breast. An oval circumscribed far posterior left lower inner quadrant nodule is stable over multiple prior exams dating back to at least 2011 and compatible with a benign finding. Mammographic images were processed with CAD. Targeted ultrasound is performed, showing no sonographic abnormality corresponding to the questioned palpable finding. IMPRESSION: No evidence for  malignancy. Any further management of the questioned palpable finding should be dictated by clinical assessment. RECOMMENDATION: Screening mammogram in one year.(Code:SM-B-01Y) I have discussed the findings and recommendations with the patient. Results were also provided in writing at the conclusion of the visit. If applicable, a reminder letter will be sent to the patient regarding the next appointment. BI-RADS CATEGORY  1: Negative. Electronically Signed   By: Conchita Paris M.D.   On: 11/01/2014 12:01       Assessment & Plan:   Problem List Items Addressed This Visit    Diabetes mellitus ([place]) - Primary    Low carb diet and exercise.  Follow met b and a1c.  a1c just checked 6.6.        Relevant Orders   Hemoglobin A1c   Hypercholesterolemia    Low cholesterol diet and exercise.  Follow lipid panel.   Lab Results  Component Value Date   CHOL 167 08/29/2015   HDL 35.80* 08/29/2015   LDLCALC 99 08/29/2015   TRIG 161.0* 08/29/2015   CHOLHDL 5 08/29/2015        Relevant Orders   Lipid panel   Hypertension    Blood pressure elevated on initial check.  Recheck improved.  Have her spot check her pressures.  Notify me if persistent elevation.        Relevant Orders   TSH   Basic metabolic panel   Obesity (BMI 30-39.9)    Diet and exercise.  Follow.       Rheumatoid arthritis ([place])    Followed by Dr Jefm Bryant.  On MTX.        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  Refer to vascular surgery for evaluation.        Relevant Orders   Ambulatory referral to Vascular Surgery    Other Visit Diagnoses    Vitamin B12 deficiency        Relevant Medications    cyanocobalamin ((VITAMIN B-12)) injection 1,000 mcg (Completed)    B12 deficiency        Relevant Orders    Vitamin B12        Einar Pheasant, MD

## 2015-09-06 NOTE — Telephone Encounter (Signed)
Unread mychart message mailed to patient 

## 2015-09-10 ENCOUNTER — Encounter: Payer: Self-pay | Admitting: Internal Medicine

## 2015-09-10 DIAGNOSIS — I872 Venous insufficiency (chronic) (peripheral): Secondary | ICD-10-CM | POA: Insufficient documentation

## 2015-09-10 DIAGNOSIS — I8311 Varicose veins of right lower extremity with inflammation: Secondary | ICD-10-CM | POA: Insufficient documentation

## 2015-09-10 NOTE — Assessment & Plan Note (Signed)
Blood pressure elevated on initial check.  Recheck improved.  Have her spot check her pressures.  Notify me if persistent elevation.

## 2015-09-10 NOTE — Assessment & Plan Note (Signed)
Followed by Dr Kernodle.  On MTX.   

## 2015-09-10 NOTE — Assessment & Plan Note (Signed)
Low carb diet and exercise.  Follow met b and a1c.  a1c just checked 6.6.

## 2015-09-10 NOTE — Assessment & Plan Note (Signed)
Diet and exercise.  Follow.  

## 2015-09-10 NOTE — Assessment & Plan Note (Signed)
Persistent area of concern.  Refer to vascular surgery for evaluation.

## 2015-09-10 NOTE — Assessment & Plan Note (Signed)
Low cholesterol diet and exercise.  Follow lipid panel.   Lab Results  Component Value Date   CHOL 167 08/29/2015   HDL 35.80* 08/29/2015   LDLCALC 99 08/29/2015   TRIG 161.0* 08/29/2015   CHOLHDL 5 08/29/2015

## 2015-10-18 ENCOUNTER — Other Ambulatory Visit: Payer: Self-pay | Admitting: Internal Medicine

## 2015-11-21 ENCOUNTER — Other Ambulatory Visit: Payer: Self-pay | Admitting: Internal Medicine

## 2015-11-21 DIAGNOSIS — Z1231 Encounter for screening mammogram for malignant neoplasm of breast: Secondary | ICD-10-CM

## 2015-11-26 ENCOUNTER — Other Ambulatory Visit: Payer: Self-pay | Admitting: Internal Medicine

## 2015-12-05 ENCOUNTER — Ambulatory Visit
Admission: RE | Admit: 2015-12-05 | Discharge: 2015-12-05 | Disposition: A | Discharge status: Home / Self Care | Payer: Medicare Other | Source: Ambulatory Visit | Attending: Internal Medicine | Admitting: Internal Medicine

## 2015-12-05 ENCOUNTER — Other Ambulatory Visit: Payer: Self-pay | Admitting: Internal Medicine

## 2015-12-05 DIAGNOSIS — Z1231 Encounter for screening mammogram for malignant neoplasm of breast: Secondary | ICD-10-CM

## 2015-12-11 ENCOUNTER — Other Ambulatory Visit (INDEPENDENT_AMBULATORY_CARE_PROVIDER_SITE_OTHER): Payer: Medicare Other

## 2015-12-11 DIAGNOSIS — I10 Essential (primary) hypertension: Secondary | ICD-10-CM | POA: Diagnosis not present

## 2015-12-11 DIAGNOSIS — E7800 Pure hypercholesterolemia, unspecified: Secondary | ICD-10-CM

## 2015-12-11 DIAGNOSIS — E538 Deficiency of other specified B group vitamins: Secondary | ICD-10-CM | POA: Diagnosis not present

## 2015-12-11 DIAGNOSIS — M069 Rheumatoid arthritis, unspecified: Secondary | ICD-10-CM

## 2015-12-11 DIAGNOSIS — E119 Type 2 diabetes mellitus without complications: Secondary | ICD-10-CM

## 2015-12-11 LAB — VITAMIN B12: Vitamin B-12: 504 pg/mL (ref 211–911)

## 2015-12-11 LAB — BASIC METABOLIC PANEL
BUN: 21 mg/dL (ref 6–23)
CALCIUM: 10.5 mg/dL (ref 8.4–10.5)
CO2: 27 meq/L (ref 19–32)
Chloride: 104 mEq/L (ref 96–112)
Creatinine, Ser: 1.16 mg/dL (ref 0.40–1.20)
GFR: 48.46 mL/min — AB (ref >60.00)
Glucose, Bld: 122 mg/dL — ABNORMAL HIGH (ref 70–99)
Potassium: 4.3 mEq/L (ref 3.5–5.1)
SODIUM: 138 meq/L (ref 135–145)

## 2015-12-11 LAB — LIPID PANEL
CHOLESTEROL: 158 mg/dL (ref 0–200)
HDL: 35.5 mg/dL — ABNORMAL LOW (ref >39.00)
LDL CALC: 88 mg/dL (ref 0–99)
NonHDL: 122.45
Total CHOL/HDL Ratio: 4
Triglycerides: 170 mg/dL — ABNORMAL HIGH (ref 0.0–149.0)
VLDL: 34 mg/dL (ref 0.0–40.0)

## 2015-12-11 LAB — HEMOGLOBIN A1C: HEMOGLOBIN A1C: 6.5 % (ref 4.6–6.5)

## 2015-12-11 LAB — HEPATIC FUNCTION PANEL
ALT: 25 U/L (ref 0–35)
AST: 24 U/L (ref 0–37)
Albumin: 3.9 g/dL (ref 3.5–5.2)
Alkaline Phosphatase: 62 U/L (ref 39–117)
BILIRUBIN TOTAL: 0.6 mg/dL (ref 0.2–1.2)
Bilirubin, Direct: 0.1 mg/dL (ref 0.0–0.3)
TOTAL PROTEIN: 7.4 g/dL (ref 6.0–8.3)

## 2015-12-11 LAB — TSH: TSH: 5.95 u[IU]/mL — ABNORMAL HIGH (ref 0.35–4.50)

## 2015-12-12 ENCOUNTER — Encounter: Payer: Self-pay | Admitting: Internal Medicine

## 2015-12-14 ENCOUNTER — Encounter: Payer: Self-pay | Admitting: Internal Medicine

## 2015-12-14 ENCOUNTER — Ambulatory Visit (INDEPENDENT_AMBULATORY_CARE_PROVIDER_SITE_OTHER): Payer: Medicare Other | Admitting: Internal Medicine

## 2015-12-14 VITALS — BP 132/84 | HR 72 | Temp 98.3°F | Resp 12 | Ht 66.25 in | Wt 240.0 lb

## 2015-12-14 DIAGNOSIS — K2100 Gastro-esophageal reflux disease with esophagitis, without bleeding: Secondary | ICD-10-CM

## 2015-12-14 DIAGNOSIS — E119 Type 2 diabetes mellitus without complications: Secondary | ICD-10-CM

## 2015-12-14 DIAGNOSIS — E7800 Pure hypercholesterolemia, unspecified: Secondary | ICD-10-CM

## 2015-12-14 DIAGNOSIS — Z0000 Encounter for general adult medical examination without abnormal findings: Secondary | ICD-10-CM | POA: Diagnosis not present

## 2015-12-14 DIAGNOSIS — R946 Abnormal results of thyroid function studies: Secondary | ICD-10-CM

## 2015-12-14 DIAGNOSIS — I8311 Varicose veins of right lower extremity with inflammation: Secondary | ICD-10-CM

## 2015-12-14 DIAGNOSIS — Z86010 Personal history of colonic polyps: Secondary | ICD-10-CM

## 2015-12-14 DIAGNOSIS — M069 Rheumatoid arthritis, unspecified: Secondary | ICD-10-CM

## 2015-12-14 DIAGNOSIS — L989 Disorder of the skin and subcutaneous tissue, unspecified: Secondary | ICD-10-CM

## 2015-12-14 DIAGNOSIS — R7989 Other specified abnormal findings of blood chemistry: Secondary | ICD-10-CM

## 2015-12-14 DIAGNOSIS — I10 Essential (primary) hypertension: Secondary | ICD-10-CM | POA: Diagnosis not present

## 2015-12-14 DIAGNOSIS — R42 Dizziness and giddiness: Secondary | ICD-10-CM

## 2015-12-14 DIAGNOSIS — I872 Venous insufficiency (chronic) (peripheral): Secondary | ICD-10-CM

## 2015-12-14 DIAGNOSIS — E669 Obesity, unspecified: Secondary | ICD-10-CM

## 2015-12-14 NOTE — Assessment & Plan Note (Signed)
Physical today 12/14/15.  Colonoscopy 05/14/12 - three polyps, diverticulosis and internal hemorrhoids.  Mammogram 12/05/15 - Birads I.

## 2015-12-14 NOTE — Patient Instructions (Signed)
Oral B12 1000mcg per day.  ?

## 2015-12-14 NOTE — Progress Notes (Signed)
Pre visit review using our clinic review tool, if applicable. No additional management support is needed unless otherwise documented below in the visit note. 

## 2015-12-15 ENCOUNTER — Encounter: Payer: Self-pay | Admitting: Internal Medicine

## 2015-12-15 DIAGNOSIS — R42 Dizziness and giddiness: Secondary | ICD-10-CM | POA: Insufficient documentation

## 2015-12-15 DIAGNOSIS — L989 Disorder of the skin and subcutaneous tissue, unspecified: Secondary | ICD-10-CM | POA: Insufficient documentation

## 2015-12-15 NOTE — Assessment & Plan Note (Signed)
Low carb diet and exercise.  Follow met b and a1c.   Lab Results  Component Value Date   HGBA1C 6.5 12/11/2015

## 2015-12-15 NOTE — Assessment & Plan Note (Signed)
Blood pressure on recheck improved.  Continue same medication regimen.  Follow pressures.  Follow metabolic panel.   

## 2015-12-15 NOTE — Assessment & Plan Note (Signed)
Diet and exercise.  Follow.  

## 2015-12-15 NOTE — Assessment & Plan Note (Signed)
Followed by Dr Kernodle.  On MTX.   

## 2015-12-15 NOTE — Assessment & Plan Note (Signed)
Low cholesterol diet and exercise.  Follow lipid panel.   

## 2015-12-15 NOTE — Assessment & Plan Note (Signed)
Symptoms controlled on omeprazole.   

## 2015-12-15 NOTE — Progress Notes (Signed)
Subjective:    Patient ID: Nina Perez, female    DOB: 03/06/1941, 75 y.o.   MRN: [id number]  HPI  Patient with past history of hypertension, diabetes and hypercholesterolemia.  She comes in today to follow up on these issues.  States she is doing relatively well.  Discussed diet and exercise.  No chest pain.  No sob.  No acid reflux.  No abdominal pain or cramping.  Bowels stable.  She states she woke two weeks ago with "inner ear".  States room was spinning.  Was fine if did not move.  If rolled over - would spin.  The bad dizziness lasted the day.  She has not had reoccurrence like that.  States does notice some minimal change with quick movements.  No headache.  No vision change.  Desires no further w/up or intervention.    Past Medical History:  Diagnosis Date  . Barrett's esophagus   . Diabetes mellitus ([place])   . Gout   . History of colon cancer    adenomatous polyps  . Hypercholesterolemia   . Hypertension   . 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 Osteoarthritis    knees, spine  . Phlebitis    x2 (with pregnancy)  . 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 Pulmonary hypertension ([place])   . Reflux esophagitis   . Rheumatoid arthritis(714.0)    positive RF, FANA, RNP, negative CCP ab and anti DNA,.  neg anti-SCL 70  . Scleroderma ([place])    raynaud's, sclerodactyly, telangiectasias   Past Surgical History:  Procedure Laterality Date  . BREAST CYST ASPIRATION Left   . VEIN LIGATION AND STRIPPING     Family History  Problem Relation Age of Onset  . Arthritis Mother   . Heart attack Father   . Colon cancer Neg Hx    Social History   Social History  . Marital status: Married    Spouse name: N/A  . Number of children: N/A  . Years of education: N/A   Social History Main Topics  . Smoking status: Former Smoker    Packs/day: 0.50    Years: 15.00    Quit date: 05/12/1988  . Smokeless tobacco: Never Used  . Alcohol use No  . Drug use: No  . Sexual activity: Yes   Other  Topics Concern  . None   Social History Narrative  . None    Outpatient Encounter Prescriptions as of 12/14/2015  Medication Sig  . allopurinol (ZYLOPRIM) 300 MG tablet TAKE ONE TABLET BY MOUTH ONCE DAILY  . amLODipine (NORVASC) 10 MG tablet TAKE ONE TABLET BY MOUTH ONCE DAILY  . aspirin 81 MG tablet Take 81 mg by mouth daily. Reported on 07/24/2015  . fluticasone (FLONASE) 50 MCG/ACT nasal spray Place 2 sprays into the nose daily.  . folic acid (FOLVITE) 1 MG tablet Take 1 mg by mouth daily.  Marland Kitchen glucose blood test strip Contour test strips-use to test blood sugars twice a day. Dx E11.9  . lisinopril (PRINIVIL,ZESTRIL) 40 MG tablet TAKE ONE TABLET BY MOUTH ONCE DAILY  . metFORMIN (GLUCOPHAGE) 500 MG tablet TAKE ONE TABLET BY MOUTH TWICE DAILY WITH MEALS (Patient taking differently: TAKE ONE TABLET BY MOUTH ONCE  DAILY WITH MEALS)  . methotrexate 2.5 MG tablet Take 6 tablets q week  . metoprolol succinate (TOPROL-XL) 50 MG 24 hr tablet TAKE ONE TABLET BY MOUTH ONCE DAILY IMMEDIATELY  FOLLOWING  A  MEAL  . MICROLET LANCETS MISC Check sugar once  daily, [employer] Microlet Lancets. Dx E11.9  . mupirocin ointment (BACTROBAN) 2 % Place 1 application into the nose 2 (two) times daily.  . naproxen (NAPROSYN) 500 MG tablet Take 500 mg by mouth 2 (two) times daily as needed.  Marland Kitchen omeprazole (PRILOSEC) 20 MG capsule Take 20 mg by mouth daily.  Marland Kitchen spironolactone (ALDACTONE) 25 MG tablet TAKE ONE TABLET BY MOUTH ONCE DAILY  . gabapentin (NEURONTIN) 100 MG capsule Take 1 capsule (100 mg total) by mouth 2 (two) times daily. (Patient not taking: Reported on 12/14/2015)   No facility-administered encounter medications on file as of 12/14/2015.     Review of Systems  Constitutional: Negative for appetite change and unexpected weight change.  HENT: Negative for congestion and sinus pressure.   Eyes: Negative for pain and visual disturbance.  Respiratory: Negative for cough, chest tightness and shortness of breath.    Cardiovascular: Negative for chest pain, palpitations and leg swelling.  Gastrointestinal: Negative for abdominal pain, diarrhea, nausea and vomiting.  Genitourinary: Negative for difficulty urinating and dysuria.  Musculoskeletal: Negative for back pain.       Joint stiffness.    Skin: Negative for color change and rash.  Neurological: Positive for dizziness and light-headedness. Negative for headaches.  Hematological: Negative for adenopathy. Does not bruise/bleed easily.  Psychiatric/Behavioral: Negative for agitation and dysphoric mood.       Objective:     Blood pressure rechecked by me:  132/84  Physical Exam  Constitutional: She is oriented to person, place, and time. She appears well-developed and well-nourished. No distress.  HENT:  Nose: Nose normal.  Mouth/Throat: Oropharynx is clear and moist.  Eyes: Right eye exhibits no discharge. Left eye exhibits no discharge. No scleral icterus.  Neck: Neck supple. No thyromegaly present.  Cardiovascular: Normal rate and regular rhythm.   Pulmonary/Chest: Breath sounds normal. No accessory muscle usage. No tachypnea. No respiratory distress. She has no decreased breath sounds. She has no wheezes. She has no rhonchi. Right breast exhibits no inverted nipple, no mass, no nipple discharge and no tenderness (no axillary adenopathy). Left breast exhibits no inverted nipple, no mass, no nipple discharge and no tenderness (no axilarry adenopathy).  Abdominal: Soft. Bowel sounds are normal. There is no tenderness.  Musculoskeletal: She exhibits no edema or tenderness.  Lymphadenopathy:    She has no cervical adenopathy.  Neurological: She is alert and oriented to person, place, and time.  Skin: Skin is warm. No rash noted. No erythema.  Psychiatric: She has a normal mood and affect. Her behavior is normal.    BP 132/84   Pulse 72   Temp 98.3 F (36.8 C)   Resp 12   Ht 5' 6.25" (1.683 m)   Wt 240 lb (108.9 kg)   LMP 05/25/1988    BMI 38.45 kg/m  Wt Readings from Last 3 Encounters:  12/14/15 240 lb (108.9 kg)  09/04/15 241 lb 12.8 oz (109.7 kg)  07/24/15 244 lb 6 oz (110.8 kg)     Lab Results  Component Value Date   WBC 6.2 08/29/2015   HGB 14.4 08/29/2015   HCT 44.2 08/29/2015   PLT 205.0 08/29/2015   GLUCOSE 122 (H) 12/11/2015   CHOL 158 12/11/2015   TRIG 170.0 (H) 12/11/2015   HDL 35.50 (L) 12/11/2015   LDLCALC 88 12/11/2015   ALT 25 12/11/2015   AST 24 12/11/2015   NA 138 12/11/2015   K 4.3 12/11/2015   CL 104 12/11/2015   CREATININE 1.16 12/11/2015  BUN 21 12/11/2015   CO2 27 12/11/2015   TSH 5.95 (H) 12/11/2015   HGBA1C 6.5 12/11/2015   MICROALBUR 8.9 (H) 01/09/2015    Mm Screening Breast Tomo Bilateral  Result Date: 12/05/2015 CLINICAL DATA:  Screening. EXAM: 2D DIGITAL SCREENING BILATERAL MAMMOGRAM WITH CAD AND ADJUNCT TOMO COMPARISON:  Previous exam(s). ACR Breast Density Category b: There are scattered areas of fibroglandular density. FINDINGS: There are no findings suspicious for malignancy. Images were processed with CAD. IMPRESSION: No mammographic evidence of malignancy. A result letter of this screening mammogram will be mailed directly to the patient. RECOMMENDATION: Screening mammogram in one year. (Code:SM-B-01Y) BI-RADS CATEGORY  1: Negative. Electronically Signed   By: Franki Cabot M.D.   On: 12/05/2015 14:14      Assessment & Plan:   Problem List Items Addressed This Visit    Diabetes mellitus ([place])    Low carb diet and exercise.  Follow met b and a1c.   Lab Results  Component Value Date   HGBA1C 6.5 12/11/2015        Dizziness    Had episode of vertigo as outlined.  Better now.  See note.  Discussed further w/up and evaluation.  She declines.  Follow.        GERD (gastroesophageal reflux disease)    Symptoms controlled on omeprazole.        Health care maintenance    Physical today 12/14/15.  Colonoscopy 05/14/12 - three polyps, diverticulosis and internal  hemorrhoids.  Mammogram 12/05/15 - Birads I.        History of colon polyps    Colonoscopy 05/14/12 - as outlined.  Follow.        Hypercholesterolemia    Low cholesterol diet and exercise.  Follow lipid panel.        Hypertension    Blood pressure on recheck improved.  Continue same medication regimen.  Follow pressures.  Follow metabolic panel.        Lesion of neck    Persistent lesion on neck.  Refer to dermatology.        Relevant Orders   Ambulatory referral to Dermatology   Obesity (BMI 30-39.9)    Diet and exercise.  Follow.       Rheumatoid arthritis ([place])    Followed by Dr Jefm Bryant.  On MTX.        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  Saw vascular surgery.  Doing well.  Follow.         Other Visit Diagnoses    Elevated TSH    -  Primary   TSH slightly elevated.  recheck tsh in 4 - 6 weeks.         Einar Pheasant, MD

## 2015-12-15 NOTE — Assessment & Plan Note (Signed)
Recent lower extremity ultrasound - no DVT.  Saw vascular surgery.  Doing well.  Follow.

## 2015-12-15 NOTE — Assessment & Plan Note (Signed)
Had episode of vertigo as outlined.  Better now.  See note.  Discussed further w/up and evaluation.  She declines.  Follow.

## 2015-12-15 NOTE — Assessment & Plan Note (Signed)
Persistent lesion on neck.  Refer to dermatology.

## 2015-12-15 NOTE — Assessment & Plan Note (Signed)
Colonoscopy 05/14/12 - as outlined.  Follow.

## 2015-12-18 ENCOUNTER — Other Ambulatory Visit: Payer: Self-pay | Admitting: Internal Medicine

## 2016-01-07 ENCOUNTER — Other Ambulatory Visit: Payer: Self-pay

## 2016-01-21 ENCOUNTER — Other Ambulatory Visit: Payer: Self-pay | Admitting: Internal Medicine

## 2016-02-12 ENCOUNTER — Other Ambulatory Visit: Payer: Self-pay | Admitting: Internal Medicine

## 2016-02-26 ENCOUNTER — Other Ambulatory Visit: Payer: Self-pay | Admitting: Internal Medicine

## 2016-03-04 ENCOUNTER — Encounter (INDEPENDENT_AMBULATORY_CARE_PROVIDER_SITE_OTHER): Payer: Self-pay | Admitting: Vascular Surgery

## 2016-03-04 ENCOUNTER — Ambulatory Visit (INDEPENDENT_AMBULATORY_CARE_PROVIDER_SITE_OTHER): Payer: Medicare Other | Admitting: Vascular Surgery

## 2016-03-04 VITALS — BP 182/85 | HR 78 | Resp 16 | Ht 66.0 in | Wt 243.0 lb

## 2016-03-04 DIAGNOSIS — E119 Type 2 diabetes mellitus without complications: Secondary | ICD-10-CM | POA: Diagnosis not present

## 2016-03-04 DIAGNOSIS — I10 Essential (primary) hypertension: Secondary | ICD-10-CM | POA: Diagnosis not present

## 2016-03-04 DIAGNOSIS — I890 Lymphedema, not elsewhere classified: Secondary | ICD-10-CM | POA: Diagnosis not present

## 2016-03-04 NOTE — Progress Notes (Signed)
Subjective:    Patient ID: Nina Perez, female    DOB: [birth year], 75 y.o.   MRN: [id number] Chief Complaint  Patient presents with  . Re-evaluation    Right leg pain and soreness   Patient last seen on 09/24/15 for right lower extremity edema and pain. She underwent a right lower extremity reflux study which was notable for a stripped GSV and no accessory reflux. The patient presents today with pain and swelling of the right lower extremity pain. The patient states she has experienced intermittent pain and swelling since her last visit. She does not wear compression stockings or elevate her legs as advised during her last visit. She is also complaining of her leg starting to turn red and is becoming "more tender". Denies fever, nausea or vomiting. Denies SOB or chest pain.    Review of Systems  Constitutional: Negative.   HENT: Negative.   Eyes: Negative.   Respiratory: Negative.   Cardiovascular: Positive for leg swelling (Right Lower Extremity).  Gastrointestinal: Negative.   Endocrine: Negative.   Genitourinary: Negative.   Musculoskeletal: Negative.   Skin: Positive for color change (Right Lower Extremity Erythema).  Allergic/Immunologic: Negative.   Neurological: Negative.   Hematological: Negative.   Psychiatric/Behavioral: Negative.       Objective:   Physical Exam  Constitutional: She is oriented to person, place, and time. She appears well-developed and well-nourished.  Obese  HENT:  Head: Normocephalic and atraumatic.  Eyes: Conjunctivae and EOM are normal.  Neck: Normal range of motion.  Cardiovascular: Normal rate, regular rhythm, normal heart sounds and intact distal pulses.   Pulses:      Radial pulses are 2+ on the right side, and 2+ on the left side.  Hard to assess pedal pulses due to edema  Pulmonary/Chest: Effort normal and breath sounds normal.  Abdominal: Soft. Bowel sounds are normal.  Musculoskeletal: Normal range of motion. She exhibits edema  (Right: Moderate Edema. Left: Mild Edema.).  Neurological: She is alert and oriented to person, place, and time.  Skin:  Right Lower Extremity: Mild erythema noted. No ulceration noted.   Psychiatric: She has a normal mood and affect. Her behavior is normal. Judgment and thought content normal.   BP (!) 182/85   Pulse 78   Resp 16   Ht 5\' 6"  (1.676 m)   Wt 243 lb (110.2 kg)   LMP 05/25/1988   BMI 39.22 kg/m   Past Medical History:  Diagnosis Date  . Barrett's esophagus   . Diabetes mellitus ([place])   . Gout   . History of colon cancer    adenomatous polyps  . Hypercholesterolemia   . Hypertension   . Inflammatory arthritis   . Osteoarthritis    knees, spine  . Phlebitis    x2 (with pregnancy)  . 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 Pulmonary hypertension   . Reflux esophagitis   . Rheumatoid arthritis(714.0)    positive RF, FANA, RNP, negative CCP ab and anti DNA,.  neg anti-SCL 70  . 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 Marital status: Married    Spouse name: N/A  . Number of children: N/A  . Years of education: N/A   Occupational History  . Not on file.   Social History Main Topics  . Smoking status: Former Smoker    Packs/day: 0.50    Years: 15.00    Quit date: 05/12/1988  .  Smokeless tobacco: Never Used  . Alcohol use No  . Drug use: No  . Sexual activity: Yes   Other Topics Concern  . Not on file   Social History Narrative  . No narrative on file   Past Surgical History:  Procedure Laterality Date  . BREAST CYST ASPIRATION Left   . VEIN LIGATION AND STRIPPING     Family History  Problem Relation Age of Onset  . Arthritis Mother   . Heart attack Father   . Colon cancer Neg Hx    Allergies  Allergen Reactions  . Maxzide [Triamterene-Hctz] Other (See Comments)    Weakness/fatigue       Assessment & Plan:  Patient last seen on 09/24/15 for right lower extremity edema and pain.  She underwent a right lower extremity reflux study which was notable for a stripped GSV and no accessory reflux. The patient presents today with pain and swelling of the right lower extremity pain. The patient states she has experienced intermittent pain and swelling since her last visit. She does not wear compression stockings or elevate her legs as advised during her last visit. She is also complaining of her leg starting to turn red and is becoming "more tender". Denies fever, nausea or vomiting. Denies SOB or chest pain.   1. Lymphedema - Worsening Patient with mild cellulitis. Keflex 500mg  one tab qh hours x 10 days given. Patient to call office in 3-4 days if erythema does not improve.  I spent time discussing her duplex results again from 09/2015, the difference between venous insufficiency vs lymphedema and why her swelling is not venous in origin. I discussed lymphedema and what symptoms it causes and how to manage them. The patient was encouraged to wear graduated compression stockings (20-30 mmHg) on a daily basis. The patient was instructed to begin wearing the stockings first thing in the morning and removing them in the evening. The patient was instructed specifically not to sleep in the stockings. Patient states she has stockings. In addition, behavioral modification including elevation during the day will be initiated. We discussed a lymphedema pump if conventional therapy goes not work. The patient was advised to follow up in one month after wearing her compression stockings daily with elevation for reassessment.  2. Type 2 diabetes mellitus without complication, without long-term current use of insulin (HCC) - Stable Encouraged good control as its slows the progression of atherosclerotic disease  3. Essential hypertension - Stable Encouraged good control as its slows the progression of atherosclerotic disease  Current Outpatient Prescriptions on File Prior to Visit  Medication Sig  Dispense Refill  . allopurinol (ZYLOPRIM) 300 MG tablet TAKE ONE TABLET BY MOUTH ONCE DAILY 30 tablet 9  . amLODipine (NORVASC) 10 MG tablet TAKE ONE TABLET BY MOUTH ONCE DAILY 90 tablet 3  . aspirin 81 MG tablet Take 81 mg by mouth daily. Reported on 07/24/2015    . fluticasone (FLONASE) 50 MCG/ACT nasal spray Place 2 sprays into the nose daily. 16 g 1  . folic acid (FOLVITE) 1 MG tablet Take 1 mg by mouth daily.    Marland Kitchen glucose blood test strip Contour test strips-use to test blood sugars twice a day. Dx E11.9 100 each 12  . lisinopril (PRINIVIL,ZESTRIL) 40 MG tablet TAKE ONE TABLET BY MOUTH ONCE DAILY 90 tablet 3  . metFORMIN (GLUCOPHAGE) 500 MG tablet TAKE ONE TABLET BY MOUTH TWICE DAILY WITH MEALS 60 tablet 5  . methotrexate 2.5 MG tablet Take 6 tablets q week    .  metoprolol succinate (TOPROL-XL) 50 MG 24 hr tablet TAKE ONE TABLET BY MOUTH ONCE DAILY IMMEDIATELY  FOLLOWING  A  MEAL 30 tablet 0  . MICROLET LANCETS MISC Check sugar once daily, [employer] Microlet Lancets. Dx E11.9 100 each 12  . mupirocin ointment (BACTROBAN) 2 % Place 1 application into the nose 2 (two) times daily. 22 g 0  . naproxen (NAPROSYN) 500 MG tablet Take 500 mg by mouth 2 (two) times daily as needed.    Marland Kitchen omeprazole (PRILOSEC) 20 MG capsule Take 20 mg by mouth daily.    Marland Kitchen spironolactone (ALDACTONE) 25 MG tablet TAKE ONE TABLET BY MOUTH ONCE DAILY 30 tablet 5   No current facility-administered medications on file prior to visit.     There are no Patient Instructions on file for this visit. No Follow-up on file.   KIMBERLY A STEGMAYER, PA-C

## 2016-03-17 ENCOUNTER — Other Ambulatory Visit: Payer: Self-pay | Admitting: Internal Medicine

## 2016-03-20 ENCOUNTER — Encounter: Payer: Self-pay | Admitting: Internal Medicine

## 2016-03-20 ENCOUNTER — Ambulatory Visit (INDEPENDENT_AMBULATORY_CARE_PROVIDER_SITE_OTHER): Payer: Medicare Other | Admitting: Internal Medicine

## 2016-03-20 ENCOUNTER — Ambulatory Visit (INDEPENDENT_AMBULATORY_CARE_PROVIDER_SITE_OTHER): Payer: Medicare Other

## 2016-03-20 VITALS — BP 142/82 | HR 74 | Temp 98.1°F | Resp 14 | Ht 66.0 in | Wt 239.1 lb

## 2016-03-20 DIAGNOSIS — E2839 Other primary ovarian failure: Secondary | ICD-10-CM | POA: Diagnosis not present

## 2016-03-20 DIAGNOSIS — Z0000 Encounter for general adult medical examination without abnormal findings: Secondary | ICD-10-CM

## 2016-03-20 DIAGNOSIS — I890 Lymphedema, not elsewhere classified: Secondary | ICD-10-CM

## 2016-03-20 DIAGNOSIS — E119 Type 2 diabetes mellitus without complications: Secondary | ICD-10-CM

## 2016-03-20 DIAGNOSIS — M069 Rheumatoid arthritis, unspecified: Secondary | ICD-10-CM

## 2016-03-20 DIAGNOSIS — I872 Venous insufficiency (chronic) (peripheral): Secondary | ICD-10-CM

## 2016-03-20 MED ORDER — TRAMADOL HCL 50 MG PO TABS: 50.0000 mg | ORAL_TABLET | Freq: Two times a day (BID) | ORAL | 0 refills | Status: DC | PRN

## 2016-03-20 NOTE — Patient Instructions (Addendum)
  Ms. Degenhart , Thank you for taking time to come for your Medicare Wellness Visit. I appreciate your ongoing commitment to your health goals. Please review the following plan we discussed and let me know if I can assist you in the future.   FOLLOW UP WITH DR. Nicki Reaper AS NEEDED.  DEXA SCAN (BONE DENSITY) FOLLOW AS DIRECTED.  These are the goals we discussed: Goals    . Increase physical activity          Chair exercises as demonstrated, increase as tolerated.       This is a list of the screening recommended for you and due dates:  Health Maintenance  Topic Date Due  . Complete foot exam   05/23/1951  . Eye exam for diabetics  05/23/1951  . Tetanus Vaccine  05/22/1960  . Shingles Vaccine  05/22/2001  . DEXA scan (bone density measurement)  05/22/2006  . Pneumonia vaccines (2 of 2 - PPSV23) 09/07/2014  . Flu Shot  08/08/2016*  . Hemoglobin A1C  06/12/2016  . Mammogram  12/04/2016  . Colon Cancer Screening  05/14/2022  *Topic was postponed. The date shown is not the original due date.    Bone Densitometry Bone densitometry is an imaging test that uses a special X-ray to measure the amount of calcium and other minerals in your bones (bone density). This test is also known as a bone mineral density test or dual-energy X-ray absorptiometry (DXA). The test can measure bone density at your hip and your spine. It is similar to having a regular X-ray. You may have this test to:  Diagnose a condition that causes weak or thin bones (osteoporosis).  Predict your risk of a broken bone (fracture).  Determine how well osteoporosis treatment is working. LET [facility] CARE PROVIDER KNOW ABOUT:  Any allergies you have.  All medicines you are taking, including vitamins, herbs, eye drops, creams, and over-the-counter medicines.  Previous problems you or members of your family have had with the use of anesthetics.  Any blood disorders you have.  Previous surgeries you have had.  Medical  conditions you have.  Possibility of pregnancy.  Any other medical test you had within the previous 14 days that used contrast material. RISKS AND COMPLICATIONS Generally, this is a safe procedure. However, problems can occur and may include the following:  This test exposes you to a very small amount of radiation.  The risks of radiation exposure may be greater to unborn children. BEFORE THE PROCEDURE  Do not take any calcium supplements for 24 hours before having the test. You can otherwise eat and drink what you usually do.  Take off all metal jewelry, eyeglasses, dental appliances, and any other metal objects. PROCEDURE  You may lie on an exam table. There will be an X-ray generator below you and an imaging device above you.  Other devices, such as boxes or braces, may be used to position your body properly for the scan.  You will need to lie still while the machine slowly scans your body.  The images will show up on a computer monitor. AFTER THE PROCEDURE You may need more testing at a later time.   This information is not intended to replace advice given to you by your health care provider. Make sure you discuss any questions you have with your health care provider.   Document Released: 05/20/2004 Document Revised: 05/19/2014 Document Reviewed: 10/06/2013 Elsevier Interactive Patient Education [employer].

## 2016-03-20 NOTE — Progress Notes (Signed)
Patient ID: Nina Perez, female   DOB: [birth year], 75 y.o.   MRN: [id number]   Subjective:    Patient ID: Nina Perez, female    DOB: 1941-02-11, 75 y.o.   MRN: [id number]  HPI  Patient here as a work in with concerns regarding some pain in her lower legs.  She was referred to [facility] last visit with concerns regarding lower extremity swelling. Was seen 03/04/16 in follow up at [facility].  Was placed on keflex. She comes in today for wellness visit and informed nurse of her leg pain and new leg lesion.  Still with some increased swelling.  Previous ultrasound revealed no DVT.  She has been wearing her compression hose.  States has even been wearing them when she sleeps.  Increased soreness and burning pain in her lower legs.  No pain or burning in her feet.  Keeps her from sleeping well.  Has tried otc pain meds and her naprosyn (from Dr Jefm Bryant).  No help.  She also has a "scabbed" area on her right leg.  Was concerned that this would progress.  No fever.     Past Medical History:  Diagnosis Date  . Barrett's esophagus   . Diabetes mellitus ([place])   . Gout   . History of colon cancer    adenomatous polyps  . Hypercholesterolemia   . Hypertension   . Inflammatory arthritis   . Osteoarthritis    knees, spine  . Phlebitis    x2 (with pregnancy)  . 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 Pulmonary hypertension   . Reflux esophagitis   . Rheumatoid arthritis(714.0)    positive RF, FANA, RNP, negative CCP ab and anti DNA,.  neg anti-SCL 70  . 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 BREAST CYST ASPIRATION Left   . VEIN LIGATION AND STRIPPING     Family History  Problem Relation Age of Onset  . Arthritis Mother   . Heart attack Father   . Colon cancer Neg Hx    Social History   Social History  . Marital status: Married    Spouse name: N/A  . Number of children: N/A  . Years of education: N/A   Social  History Main Topics  . Smoking status: Former Smoker    Packs/day: 0.50    Years: 15.00    Quit date: 05/12/1988  . Smokeless tobacco: Never Used  . Alcohol use No  . Drug use: No  . Sexual activity: Yes   Other Topics Concern  . None   Social History Narrative  . None    Outpatient Encounter Prescriptions as of 03/20/2016  Medication Sig  . allopurinol (ZYLOPRIM) 300 MG tablet TAKE ONE TABLET BY MOUTH ONCE DAILY  . amLODipine (NORVASC) 10 MG tablet TAKE ONE TABLET BY MOUTH ONCE DAILY  . aspirin 81 MG tablet Take 81 mg by mouth daily. Reported on 07/24/2015  . fluticasone (FLONASE) 50 MCG/ACT nasal spray Place 2 sprays into the nose daily.  . folic acid (FOLVITE) 1 MG tablet Take 1 mg by mouth daily.  Marland Kitchen glucose blood test strip Contour test strips-use to test blood sugars twice a day. Dx E11.9  . lisinopril (PRINIVIL,ZESTRIL) 40 MG tablet TAKE ONE TABLET BY MOUTH ONCE DAILY  . metFORMIN (GLUCOPHAGE) 500 MG tablet TAKE ONE TABLET BY MOUTH TWICE DAILY WITH MEALS  . methotrexate 2.5 MG tablet Take 6 tablets q week  .  metoprolol succinate (TOPROL-XL) 50 MG 24 hr tablet TAKE ONE TABLET BY MOUTH ONCE DAILY IMMEDIATELY  FOLLOWING  A  MEAL  . MICROLET LANCETS MISC Check sugar once daily, [employer] Microlet Lancets. Dx E11.9  . mupirocin ointment (BACTROBAN) 2 % Place 1 application into the nose 2 (two) times daily.  . naproxen (NAPROSYN) 500 MG tablet Take 500 mg by mouth 2 (two) times daily as needed.  Marland Kitchen omeprazole (PRILOSEC) 20 MG capsule Take 20 mg by mouth daily.  Marland Kitchen spironolactone (ALDACTONE) 25 MG tablet TAKE ONE TABLET BY MOUTH ONCE DAILY  . traMADol (ULTRAM) 50 MG tablet Take 1 tablet (50 mg total) by mouth 2 (two) times daily as needed.   No facility-administered encounter medications on file as of 03/20/2016.     Review of Systems  Constitutional: Negative for appetite change and fever.  Cardiovascular: Positive for leg swelling.       DP pulses palpable and equal bilaterally.      Gastrointestinal: Negative for nausea and vomiting.  Musculoskeletal: Negative for joint swelling.       Lower leg pain as outlined.    Skin: Positive for color change.       Increased erythema - lower extremities - appear to be more related to stasis changes.  Scabbed lesion - right lower leg.         Objective:    Physical Exam  Constitutional: She appears well-developed and well-nourished. No distress.  Cardiovascular: Normal rate and regular rhythm.   Pulmonary/Chest: Effort normal and breath sounds normal. No respiratory distress.  Musculoskeletal: She exhibits edema and tenderness.  Skin:  Legs tight.  Increased erythema - appears to be more c/w stasis changes.  Scabbed lesion - right lower extremity.  DP pulses palpable and equal bilaterally.    Psychiatric: She has a normal mood and affect. Her behavior is normal.    BP (!) 142/82   Pulse 74   Temp 98.1 F (36.7 C) (Oral)   Resp 14   Ht 5\' 6"  (1.676 m)   Wt 239 lb (108.4 kg)   LMP 05/25/1988   SpO2 95%   BMI 38.58 kg/m  Wt Readings from Last 3 Encounters:  03/20/16 239 lb (108.4 kg)  03/20/16 239 lb 1.9 oz (108.5 kg)  03/04/16 243 lb (110.2 kg)     Lab Results  Component Value Date   WBC 6.2 08/29/2015   HGB 14.4 08/29/2015   HCT 44.2 08/29/2015   PLT 205.0 08/29/2015   GLUCOSE 122 (H) 12/11/2015   CHOL 158 12/11/2015   TRIG 170.0 (H) 12/11/2015   HDL 35.50 (L) 12/11/2015   LDLCALC 88 12/11/2015   ALT 25 12/11/2015   AST 24 12/11/2015   NA 138 12/11/2015   K 4.3 12/11/2015   CL 104 12/11/2015   CREATININE 1.16 12/11/2015   BUN 21 12/11/2015   CO2 27 12/11/2015   TSH 5.95 (H) 12/11/2015   HGBA1C 6.5 12/11/2015   MICROALBUR 8.9 (H) 01/09/2015    Mm Screening Breast Tomo Bilateral  Result Date: 12/05/2015 CLINICAL DATA:  Screening. EXAM: 2D DIGITAL SCREENING BILATERAL MAMMOGRAM WITH CAD AND ADJUNCT TOMO COMPARISON:  Previous exam(s). ACR Breast Density Category b: There are scattered areas of  fibroglandular density. FINDINGS: There are no findings suspicious for malignancy. Images were processed with CAD. IMPRESSION: No mammographic evidence of malignancy. A result letter of this screening mammogram will be mailed directly to the patient. RECOMMENDATION: Screening mammogram in one year. (Code:SM-B-01Y) BI-RADS CATEGORY  1: Negative.  Electronically Signed   By: Franki Cabot M.D.   On: 12/05/2015 14:14      Assessment & Plan:   Problem List Items Addressed This Visit    Diabetes mellitus ([place])    Has been well controlled.  Follow.       Lymphedema    Was seen by [facility].  Felt to have lymphedema.  Wearing compression hose.  Continue.  Off at night.  Keep f/u appt tomorrow with [facility].       Rheumatoid arthritis ([place])    Followed by Dr Jefm Bryant.  Stable on MTX.       Relevant Medications   traMADol (ULTRAM) 50 MG tablet   Venous stasis dermatitis of right lower extremity    Recent lower extremity ultrasound - no DVT.  With increased erythema.  Appears to be more c/w stasis changes.  Wearing compression hose.  Even wearing when sleeping.  Continue compression hose.  Off at night.  Keep appt tomorrow with  [facility].  Has the scabbed lesion.  Apply bactroban as directed.  She reports has required wrapping previously.  Will have vascular surgery evaluate.  Gave tramadol to help with pain.            Einar Pheasant, MD

## 2016-03-20 NOTE — Progress Notes (Addendum)
Subjective:   Nina Perez is a 75 y.o. female who presents for an Initial Medicare Annual Wellness Visit.  Review of Systems    No ROS.  Medicare Wellness Visit.  Cardiac Risk Factors include: advanced age (>48men, >78 women);obesity (BMI >30kg/m2);diabetes mellitus;hypertension     Objective:    Today's Vitals   03/20/16 1559  BP: (!) 142/82  Pulse: 74  Resp: 14  Temp: 98.1 F (36.7 C)  SpO2: 95%  Weight: 239 lb 1.9 oz (108.5 kg)  Height: 5\' 6"  (1.676 m)  PainSc: 8    Body mass index is 38.59 kg/m.   Current Medications (verified) Outpatient Encounter Prescriptions as of 03/20/2016  Medication Sig  . allopurinol (ZYLOPRIM) 300 MG tablet TAKE ONE TABLET BY MOUTH ONCE DAILY  . amLODipine (NORVASC) 10 MG tablet TAKE ONE TABLET BY MOUTH ONCE DAILY  . aspirin 81 MG tablet Take 81 mg by mouth daily. Reported on 07/24/2015  . fluticasone (FLONASE) 50 MCG/ACT nasal spray Place 2 sprays into the nose daily.  . folic acid (FOLVITE) 1 MG tablet Take 1 mg by mouth daily.  Marland Kitchen glucose blood test strip Contour test strips-use to test blood sugars twice a day. Dx E11.9  . lisinopril (PRINIVIL,ZESTRIL) 40 MG tablet TAKE ONE TABLET BY MOUTH ONCE DAILY  . metFORMIN (GLUCOPHAGE) 500 MG tablet TAKE ONE TABLET BY MOUTH TWICE DAILY WITH MEALS  . methotrexate 2.5 MG tablet Take 6 tablets q week  . metoprolol succinate (TOPROL-XL) 50 MG 24 hr tablet TAKE ONE TABLET BY MOUTH ONCE DAILY IMMEDIATELY  FOLLOWING  A  MEAL  . MICROLET LANCETS MISC Check sugar once daily, [employer] Microlet Lancets. Dx E11.9  . mupirocin ointment (BACTROBAN) 2 % Place 1 application into the nose 2 (two) times daily.  . naproxen (NAPROSYN) 500 MG tablet Take 500 mg by mouth 2 (two) times daily as needed.  Marland Kitchen omeprazole (PRILOSEC) 20 MG capsule Take 20 mg by mouth daily.  Marland Kitchen spironolactone (ALDACTONE) 25 MG tablet TAKE ONE TABLET BY MOUTH ONCE DAILY   No facility-administered encounter medications on file as of 03/20/2016.      Allergies (verified) Maxzide [triamterene-hctz]   History: Past Medical History:  Diagnosis Date  . Barrett's esophagus   . Diabetes mellitus ([employer])   . Gout   . History of colon cancer    adenomatous polyps  . Hypercholesterolemia   . Hypertension   . Inflammatory arthritis   . Osteoarthritis    knees, spine  . Phlebitis    x2 (with pregnancy)  . 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 Pulmonary hypertension   . Reflux esophagitis   . Rheumatoid arthritis(714.0)    positive RF, FANA, RNP, negative CCP ab and anti DNA,.  neg anti-SCL 70  . 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 BREAST CYST ASPIRATION Left   . VEIN LIGATION AND STRIPPING     Family History  Problem Relation Age of Onset  . Arthritis Mother   . Heart attack Father   . Colon cancer Neg Hx    Social History   Occupational History  . Not on file.   Social History Main Topics  . Smoking status: Former Smoker    Packs/day: 0.50    Years: 15.00    Quit date: 05/12/1988  . Smokeless tobacco: Never Used  . Alcohol use No  . Drug use: No  . Sexual activity: Yes  Tobacco Counseling Counseling given: Not Answered   Activities of Daily Living In your present state of health, do you have any difficulty performing the following activities: 03/20/2016 12/14/2015  Hearing? N N  Vision? N N  Difficulty concentrating or making decisions? N N  Walking or climbing stairs? Y Y  Dressing or bathing? N N  Doing errands, shopping? N N  Preparing Food and eating ? N -  Using the Toilet? N -  In the past six months, have you accidently leaked urine? Y -  Do you have problems with loss of bowel control? N -  Managing your Medications? N -  Managing your Finances? N -  Housekeeping or managing your Housekeeping? N -  Some recent data might be hidden    Immunizations and Health Maintenance Immunization History  Administered  Date(s) Administered  . Influenza Split 02/09/2013  . Influenza,inj,Quad PF,36+ Mos 02/28/2014, 02/01/2015  . Pneumococcal Conjugate-13 09/06/2013   Health Maintenance Due  Topic Date Due  . FOOT EXAM  05/23/1950  . OPHTHALMOLOGY EXAM  05/23/1950  . TETANUS/TDAP  05/22/1960  . ZOSTAVAX  05/22/2001  . DEXA SCAN  05/22/2006  . PNA vac Low Risk Adult (2 of 2 - PPSV23) 09/07/2014    Patient Care Team: Einar Pheasant, MD as PCP - General (Internal Medicine)  Indicate any recent Medical Services you may have received from other than [facility] providers in the past year (date may be approximate).     Assessment:   This is a routine wellness examination for Nina Perez. The goal of the wellness visit is to assist the patient how to close the gaps in care and create a preventative care plan for the patient.   Taking calcium VIT D as appropriate/Osteoporosis risk reviewed.  Medications reviewed; taking without issues or barriers.  Safety issues reviewed; lives with husband.  Smoke detectors in the home. Firearms locked in a safe within the home. Wears seatbelts when driving or riding with others. No violence in the home.  No identified risk were noted; The patient was oriented x 3; appropriate in dress and manner and no objective failures at ADL's or IADL's.   Body mass index; discussed the importance of a healthy diet, water intake and exercise. Educational material provided.   Patient Concerns: Acute leg pain, bilateral; R leg mass. Placed on PCP schedule during visit.  PCP aware and following.  Hearing/Vision screen Hearing Screening Comments: Passes the whisper test Vision Screening Comments: Followed by Dr. Clydene Laming [facility]) Annual visits Last OV 02/2015 No reported retinopathy  Dietary issues and exercise activities discussed: Current Exercise Habits: The patient does not participate in regular exercise at present  Goals    . Increase physical activity          Chair exercises as  demonstrated, increase as tolerated.      Depression Screen PHQ 2/9 Scores 03/20/2016 12/14/2015 10/27/2014 09/06/2013 09/05/2012 05/30/2012  PHQ - 2 Score 0 0 0 0 0 0    Fall Risk Fall Risk  03/20/2016 10/27/2014 09/06/2013 09/05/2012 05/30/2012  Falls in the past year? No No No No No    Cognitive Function: MMSE - Mini Mental State Exam 03/20/2016  Not completed: Unable to complete        Screening Tests Health Maintenance  Topic Date Due  . FOOT EXAM  05/23/1950  . OPHTHALMOLOGY EXAM  05/23/1951  . TETANUS/TDAP  05/22/1960  . ZOSTAVAX  05/22/2001  . DEXA SCAN  05/22/2006  . PNA vac Low Risk Adult (  2 of 2 - PPSV23) 09/07/2014  . INFLUENZA VACCINE  08/08/2016 (Originally 12/11/2015)  . HEMOGLOBIN A1C  06/12/2016  . MAMMOGRAM  12/04/2016  . COLONOSCOPY  05/14/2022      Plan:   End of life planning; Advance aging; Advanced directives discussed. No HCPOA/Living Will. Additional information declined at this time.  Follow up with PCP as needed.  Medicare Attestation I have personally reviewed: The patient's medical and social history Their use of alcohol, tobacco or illicit drugs Their current medications and supplements The patient's functional ability including ADLs,fall risks, home safety risks, and hearing and visual impairment Diet and physical activities Evidence for depression   The patient's weight, height, BMI, and visual acuity have been recorded in the chart.  I have made referrals and provided education to the patient based on review of the above and I have provided the patient with a written personalized care plan for preventive services.    During the course of the visit, Lamija was educated and counseled about the following appropriate screening and preventive services:   Vaccines to include Pneumoccal, Influenza, Hepatitis B, Td, Zostavax, HCV  Electrocardiogram  Cardiovascular disease screening  Colorectal cancer screening  Bone density screening  Diabetes  screening  Glaucoma screening  Mammography/PAP  Nutrition counseling  Smoking cessation counseling  Patient Instructions (the written plan) were given to the patient.    Varney Biles, LPN   [id number]    Reviewed above information.  Agree with plan.  Pt seen ans examined.  See note.    Dr Nicki Reaper

## 2016-03-21 ENCOUNTER — Encounter (INDEPENDENT_AMBULATORY_CARE_PROVIDER_SITE_OTHER): Payer: Self-pay | Admitting: Vascular Surgery

## 2016-03-21 ENCOUNTER — Ambulatory Visit (INDEPENDENT_AMBULATORY_CARE_PROVIDER_SITE_OTHER): Payer: Medicare Other | Admitting: Vascular Surgery

## 2016-03-21 ENCOUNTER — Encounter: Payer: Self-pay | Admitting: Internal Medicine

## 2016-03-21 ENCOUNTER — Telehealth: Payer: Self-pay | Admitting: *Deleted

## 2016-03-21 VITALS — BP 168/65 | HR 72 | Resp 16 | Ht 66.0 in | Wt 239.0 lb

## 2016-03-21 DIAGNOSIS — I10 Essential (primary) hypertension: Secondary | ICD-10-CM | POA: Diagnosis not present

## 2016-03-21 DIAGNOSIS — M7989 Other specified soft tissue disorders: Secondary | ICD-10-CM

## 2016-03-21 DIAGNOSIS — E119 Type 2 diabetes mellitus without complications: Secondary | ICD-10-CM | POA: Diagnosis not present

## 2016-03-21 DIAGNOSIS — I890 Lymphedema, not elsewhere classified: Secondary | ICD-10-CM | POA: Diagnosis not present

## 2016-03-21 DIAGNOSIS — M79606 Pain in leg, unspecified: Secondary | ICD-10-CM | POA: Diagnosis not present

## 2016-03-21 DIAGNOSIS — E7800 Pure hypercholesterolemia, unspecified: Secondary | ICD-10-CM

## 2016-03-21 NOTE — Assessment & Plan Note (Signed)
Has been well controlled.  Follow.

## 2016-03-21 NOTE — Assessment & Plan Note (Signed)
Followed by Dr Jefm Bryant.  Stable on MTX.

## 2016-03-21 NOTE — Assessment & Plan Note (Signed)
Was seen by [facility].  Felt to have lymphedema.  Wearing compression hose.  Continue.  Off at night.  Keep f/u appt tomorrow with [facility].

## 2016-03-21 NOTE — Telephone Encounter (Signed)
I reviewed the note for [facility].  The PA mentioned doing a study to check blood flow and also mentioned using a lymphedema pump.  Are they going to schedule or did they schedule any test?  Continue compression hose on the morning and off in the evening.  Did they set up a f/u with Dr Lucky Cowboy?

## 2016-03-21 NOTE — Telephone Encounter (Signed)
They are doing the study 04/29/16, she does not have a follow up with Dr.Dew he was out due to a loss in the family. Patient states she will try the compression hose

## 2016-03-21 NOTE — Telephone Encounter (Signed)
Please advise 

## 2016-03-21 NOTE — Progress Notes (Signed)
Subjective:    Patient ID: Nina Perez, female    DOB: Aug 08, 1940, 75 y.o.   MRN: [id number] Chief Complaint  Patient presents with  . Re-evaluation    1 moth follow up   Patient last seen on 09/17/15 referred by Dr. Lorin Picket for right lower extremity swelling. Duplex at that time notable for a stripped GSV, no DVT, no SVT and no reflux in SSV. Patient encouraged to wear compression stockings and elevate her legs with month follow up for lymphedema pump. Patient wanted to follow up PRN. Patient presents today with bilateral lower extremity pain and swelling. States swelling has improved in legs with use of compression and elevation. She does not want to apply to lymphedema pump. States pain in her lower extremity is present "24/7" and usually occurs without activity. Pain mostly located in ankles and feet. Right extremity worse then left. The patient denies any claudication like symptoms, rest pain or ulcers to her feet / toes.    Review of Systems  Constitutional: Negative.   HENT: Negative.   Eyes: Negative.   Respiratory: Negative.   Cardiovascular:       Bilateral Lower Extremity Pain & Swelling  Gastrointestinal: Negative.   Endocrine: Negative.   Genitourinary: Negative.   Musculoskeletal: Negative.   Skin: Negative.   Allergic/Immunologic: Negative.   Neurological: Negative.   Hematological: Negative.   Psychiatric/Behavioral: Negative.       Objective:   Physical Exam  Constitutional: She is oriented to person, place, and time. She appears well-developed and well-nourished.  HENT:  Head: Normocephalic and atraumatic.  Eyes: Conjunctivae and EOM are normal. Pupils are equal, round, and reactive to light.  Neck: Normal range of motion.  Cardiovascular: Normal rate, regular rhythm, normal heart sounds and intact distal pulses.   Pulses:      Radial pulses are 2+ on the right side, and 2+ on the left side.       Dorsalis pedis pulses are 1+ on the right side, and 1+ on the  left side.       Posterior tibial pulses are 1+ on the right side, and 1+ on the left side.  Pulmonary/Chest: Effort normal and breath sounds normal.  Abdominal: Soft. Bowel sounds are normal.  Musculoskeletal: Normal range of motion. She exhibits edema (Mild Bilateral Edema).  Neurological: She is alert and oriented to person, place, and time.  Skin: Skin is warm and dry.  Psychiatric: She has a normal mood and affect. Her behavior is normal. Judgment and thought content normal.   BP (!) 168/65 (BP Location: Right Arm)   Pulse 72   Resp 16   Ht 5\' 6"  (1.676 m)   Wt 239 lb (108.4 kg)   LMP 05/25/1988   BMI 38.58 kg/m   Past Medical History:  Diagnosis Date  . Barrett's esophagus   . Diabetes mellitus (HCC)   . Gout   . History of colon cancer    adenomatous polyps  . Hypercholesterolemia   . Hypertension   . Inflammatory arthritis   . Osteoarthritis    knees, spine  . Phlebitis    x2 (with pregnancy)  . 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 Pulmonary hypertension   . Reflux esophagitis   . Rheumatoid arthritis(714.0)    positive RF, FANA, RNP, negative CCP ab and anti DNA,.  neg anti-SCL 70  . Scleroderma (HCC)    raynaud's, sclerodactyly, telangiectasias   Social History   Social History  . Marital  status: Married    Spouse name: N/A  . Number of children: N/A  . Years of education: N/A   Occupational History  . Not on file.   Social History Main Topics  . Smoking status: Former Smoker    Packs/day: 0.50    Years: 15.00    Quit date: 05/12/1988  . Smokeless tobacco: Never Used  . Alcohol use No  . Drug use: No  . Sexual activity: Yes   Other Topics Concern  . Not on file   Social History Narrative  . No narrative on file   Past Surgical History:  Procedure Laterality Date  . BREAST CYST ASPIRATION Left   . VEIN LIGATION AND STRIPPING     Family History  Problem Relation Age of Onset  . Arthritis Mother   . Heart attack Father   . Colon cancer  Neg Hx    Allergies  Allergen Reactions  . Maxzide [Triamterene-Hctz] Other (See Comments)    Weakness/fatigue       Assessment & Plan:  Patient last seen on 09/17/15 referred by Dr. Lorin Picket for right lower extremity swelling. Duplex at that time notable for a stripped GSV, no DVT, no SVT and no reflux in SSV. Patient encouraged to wear compression stockings and elevate her legs with month follow up for lymphedema pump. Patient wanted to follow up PRN. Patient presents today with bilateral lower extremity pain and swelling. States swelling has improved in legs with use of compression and elevation. She does not want to apply to lymphedema pump. States pain in her lower extremity is present "24/7" and usually occurs without activity. Pain mostly located in ankles and feet. Right extremity worse then left. The patient denies any claudication like symptoms, rest pain or ulcers to her feet / toes.   1. Pain and swelling of lower extremity, unspecified laterality - [place] neuropathic and DJD in nature however patient has multiple atherosclerotic risk factors will bring patient back for ABI to rule out any PAD.   2. Lymphedema - Improvement Continue compression and elevation. Patient does not want to apply for lymphedema pump at this time.   3. Hypercholesterolemia - Stable On ASA for medical optimization. Represents an atherosclerotic risk factor. Encouraged good control as its slows the progression of atherosclerotic disease.  4. Type 2 diabetes mellitus without complication, without long-term current use of insulin (HCC) - Stable Represents an atherosclerotic risk factor. Encouraged good control as its slows the progression of atherosclerotic disease.  5. Essential hypertension - Stable Represents an atherosclerotic risk factor. Encouraged good control as its slows the progression of atherosclerotic disease.  Current Outpatient Prescriptions on File Prior to Visit  Medication Sig Dispense  Refill  . allopurinol (ZYLOPRIM) 300 MG tablet TAKE ONE TABLET BY MOUTH ONCE DAILY 30 tablet 9  . amLODipine (NORVASC) 10 MG tablet TAKE ONE TABLET BY MOUTH ONCE DAILY 90 tablet 3  . aspirin 81 MG tablet Take 81 mg by mouth daily. Reported on 07/24/2015    . fluticasone (FLONASE) 50 MCG/ACT nasal spray Place 2 sprays into the nose daily. 16 g 1  . folic acid (FOLVITE) 1 MG tablet Take 1 mg by mouth daily.    Marland Kitchen glucose blood test strip Contour test strips-use to test blood sugars twice a day. Dx E11.9 100 each 12  . lisinopril (PRINIVIL,ZESTRIL) 40 MG tablet TAKE ONE TABLET BY MOUTH ONCE DAILY 90 tablet 3  . metFORMIN (GLUCOPHAGE) 500 MG tablet TAKE ONE TABLET BY MOUTH TWICE DAILY  WITH MEALS 60 tablet 5  . methotrexate 2.5 MG tablet Take 6 tablets q week    . metoprolol succinate (TOPROL-XL) 50 MG 24 hr tablet TAKE ONE TABLET BY MOUTH ONCE DAILY IMMEDIATELY  FOLLOWING  A  MEAL 30 tablet 0  . MICROLET LANCETS MISC Check sugar once daily, [employer] Microlet Lancets. Dx E11.9 100 each 12  . mupirocin ointment (BACTROBAN) 2 % Place 1 application into the nose 2 (two) times daily. 22 g 0  . naproxen (NAPROSYN) 500 MG tablet Take 500 mg by mouth 2 (two) times daily as needed.    Marland Kitchen omeprazole (PRILOSEC) 20 MG capsule Take 20 mg by mouth daily.    Marland Kitchen spironolactone (ALDACTONE) 25 MG tablet TAKE ONE TABLET BY MOUTH ONCE DAILY 30 tablet 5  . traMADol (ULTRAM) 50 MG tablet Take 1 tablet (50 mg total) by mouth 2 (two) times daily as needed. 30 tablet 0   No current facility-administered medications on file prior to visit.     There are no Patient Instructions on file for this visit. No Follow-up on file.   Jacorie Ernsberger A Tyjae Issa, PA-C

## 2016-03-21 NOTE — Assessment & Plan Note (Signed)
Recent lower extremity ultrasound - no DVT.  With increased erythema.  Appears to be more c/w stasis changes.  Wearing compression hose.  Even wearing when sleeping.  Continue compression hose.  Off at night.  Keep appt tomorrow with [facility].  Has the scabbed lesion.  Apply bactroban as directed.  She reports has required wrapping previously.  Will have vascular surgery evaluate.  Gave tramadol to help with pain.

## 2016-03-21 NOTE — Telephone Encounter (Signed)
Pt requested advice from [facility] , pt reported that she was seen at [facility], she did not receive any treatment for her leg, she only had conversation with a PA. She requested advise on future care for her leg. Pt contact [phone]

## 2016-04-01 ENCOUNTER — Other Ambulatory Visit: Payer: Self-pay | Admitting: Internal Medicine

## 2016-04-08 ENCOUNTER — Ambulatory Visit (INDEPENDENT_AMBULATORY_CARE_PROVIDER_SITE_OTHER): Payer: Medicare Other | Admitting: Vascular Surgery

## 2016-04-09 ENCOUNTER — Other Ambulatory Visit (INDEPENDENT_AMBULATORY_CARE_PROVIDER_SITE_OTHER): Payer: Medicare Other

## 2016-04-09 DIAGNOSIS — R946 Abnormal results of thyroid function studies: Secondary | ICD-10-CM | POA: Diagnosis not present

## 2016-04-09 DIAGNOSIS — R7989 Other specified abnormal findings of blood chemistry: Secondary | ICD-10-CM

## 2016-04-09 LAB — TSH: TSH: 4.04 u[IU]/mL (ref 0.35–4.50)

## 2016-04-10 ENCOUNTER — Encounter: Payer: Self-pay | Admitting: Internal Medicine

## 2016-04-14 ENCOUNTER — Ambulatory Visit (INDEPENDENT_AMBULATORY_CARE_PROVIDER_SITE_OTHER): Payer: Medicare Other | Admitting: Internal Medicine

## 2016-04-14 ENCOUNTER — Encounter: Payer: Self-pay | Admitting: Internal Medicine

## 2016-04-14 VITALS — BP 130/88 | HR 82 | Temp 98.3°F | Ht 66.0 in | Wt 237.2 lb

## 2016-04-14 DIAGNOSIS — R7989 Other specified abnormal findings of blood chemistry: Secondary | ICD-10-CM

## 2016-04-14 DIAGNOSIS — Z23 Encounter for immunization: Secondary | ICD-10-CM | POA: Diagnosis not present

## 2016-04-14 DIAGNOSIS — I10 Essential (primary) hypertension: Secondary | ICD-10-CM

## 2016-04-14 DIAGNOSIS — E119 Type 2 diabetes mellitus without complications: Secondary | ICD-10-CM

## 2016-04-14 DIAGNOSIS — Z86010 Personal history of colonic polyps: Secondary | ICD-10-CM

## 2016-04-14 DIAGNOSIS — K2100 Gastro-esophageal reflux disease with esophagitis, without bleeding: Secondary | ICD-10-CM

## 2016-04-14 DIAGNOSIS — M069 Rheumatoid arthritis, unspecified: Secondary | ICD-10-CM

## 2016-04-14 DIAGNOSIS — E7800 Pure hypercholesterolemia, unspecified: Secondary | ICD-10-CM

## 2016-04-14 DIAGNOSIS — I872 Venous insufficiency (chronic) (peripheral): Secondary | ICD-10-CM

## 2016-04-14 DIAGNOSIS — E669 Obesity, unspecified: Secondary | ICD-10-CM

## 2016-04-14 LAB — BASIC METABOLIC PANEL
BUN: 18 mg/dL (ref 6–23)
CALCIUM: 10.5 mg/dL (ref 8.4–10.5)
CO2: 29 mEq/L (ref 19–32)
Chloride: 101 mEq/L (ref 96–112)
Creatinine, Ser: 0.97 mg/dL (ref 0.40–1.20)
GFR: 59.52 mL/min — AB (ref >60.00)
GLUCOSE: 164 mg/dL — AB (ref 70–99)
Potassium: 4.4 mEq/L (ref 3.5–5.1)
SODIUM: 137 meq/L (ref 135–145)

## 2016-04-14 MED ORDER — MUPIROCIN 2 % EX OINT: 1.0000 "application " | TOPICAL_OINTMENT | Freq: Two times a day (BID) | CUTANEOUS | 0 refills | Status: DC

## 2016-04-14 NOTE — Progress Notes (Signed)
Patient ID: Nina Perez, female   DOB: [birth year], 75 y.o.   MRN: [id number]   Subjective:    Patient ID: Nina Perez, female    DOB: 06/16/1940, 75 y.o.   MRN: [id number]  HPI  Patient here for a scheduled follow up.  She has been having increased lower extremity swelling and discomfort described as a burning sensation.  The burning sensation is over the lower legs.  No burning in her feet.  She has been followed by vascular surgery here in [place].  Informs me she has made an appt with Dr Aleda Grana in [place] for a second opinion.  She has a little open lesion - lower leg.  No evidence of cellulitis currently.  States other than her legs she feels she is doing relatively well.  No chest pain.  Breathing stable.  No acid reflux.  No abdominal pain or cramping.  Bowels stable.     Past Medical History:  Diagnosis Date  . Barrett's esophagus   . Diabetes mellitus ([place])   . Gout   . History of colon cancer    adenomatous polyps  . Hypercholesterolemia   . Hypertension   . Inflammatory arthritis   . Osteoarthritis    knees, spine  . Phlebitis    x2 (with pregnancy)  . 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 Pulmonary hypertension   . Reflux esophagitis   . Rheumatoid arthritis(714.0)    positive RF, FANA, RNP, negative CCP ab and anti DNA,.  neg anti-SCL 70  . 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 BREAST CYST ASPIRATION Left   . VEIN LIGATION AND STRIPPING     Family History  Problem Relation Age of Onset  . Arthritis Mother   . Heart attack Father   . Colon cancer Neg Hx    Social History   Social History  . Marital status: Married    Spouse name: N/A  . Number of children: N/A  . Years of education: N/A   Social History Main Topics  . Smoking status: Former Smoker    Packs/day: 0.50    Years: 15.00    Quit date: 05/12/1988  . Smokeless tobacco: Never Used  . Alcohol use No  . Drug  use: No  . Sexual activity: Yes   Other Topics Concern  . None   Social History Narrative  . None    Outpatient Encounter Prescriptions as of 04/14/2016  Medication Sig  . allopurinol (ZYLOPRIM) 300 MG tablet TAKE ONE TABLET BY MOUTH ONCE DAILY  . amLODipine (NORVASC) 10 MG tablet TAKE ONE TABLET BY MOUTH ONCE DAILY  . aspirin 81 MG tablet Take 81 mg by mouth daily. Reported on 07/24/2015  . fluticasone (FLONASE) 50 MCG/ACT nasal spray Place 2 sprays into the nose daily.  . folic acid (FOLVITE) 1 MG tablet Take 1 mg by mouth daily.  Marland Kitchen glucose blood test strip Contour test strips-use to test blood sugars twice a day. Dx E11.9  . lisinopril (PRINIVIL,ZESTRIL) 40 MG tablet TAKE ONE TABLET BY MOUTH ONCE DAILY  . metFORMIN (GLUCOPHAGE) 500 MG tablet TAKE ONE TABLET BY MOUTH TWICE DAILY WITH MEALS  . methotrexate 2.5 MG tablet Take 6 tablets q week  . metoprolol succinate (TOPROL-XL) 50 MG 24 hr tablet TAKE ONE TABLET BY MOUTH ONCE DAILY IMMEDIATELY  FOLLOWING  A  MEAL  . MICROLET LANCETS MISC Check sugar once daily, [employer] Microlet  Lancets. Dx E11.9  . mupirocin ointment (BACTROBAN) 2 % Place 1 application into the nose 2 (two) times daily.  . naproxen (NAPROSYN) 500 MG tablet Take 500 mg by mouth 2 (two) times daily as needed.  Marland Kitchen omeprazole (PRILOSEC) 20 MG capsule Take 20 mg by mouth daily.  Marland Kitchen spironolactone (ALDACTONE) 25 MG tablet TAKE ONE TABLET BY MOUTH ONCE DAILY  . traMADol (ULTRAM) 50 MG tablet Take 1 tablet (50 mg total) by mouth 2 (two) times daily as needed.  . [DISCONTINUED] mupirocin ointment (BACTROBAN) 2 % Place 1 application into the nose 2 (two) times daily.   No facility-administered encounter medications on file as of 04/14/2016.     Review of Systems  Constitutional: Negative for appetite change and unexpected weight change.  HENT: Negative for congestion and sinus pressure.   Respiratory: Negative for cough, chest tightness and shortness of breath.     Cardiovascular: Positive for leg swelling. Negative for chest pain and palpitations.  Gastrointestinal: Negative for abdominal pain, diarrhea, nausea and vomiting.  Genitourinary: Negative for difficulty urinating and dysuria.  Musculoskeletal: Negative for back pain.       Burning involving her lower legs.    Skin:       Redness has improved some.  Small open area lower leg.   Neurological: Negative for dizziness, light-headedness and headaches.  Psychiatric/Behavioral: Negative for agitation and dysphoric mood.       Objective:     Blood pressure rechecked by me;  138/82  Physical Exam  Constitutional: She appears well-developed and well-nourished. No distress.  HENT:  Nose: Nose normal.  Mouth/Throat: Oropharynx is clear and moist.  Neck: Neck supple. No thyromegaly present.  Cardiovascular: Normal rate and regular rhythm.   Pulmonary/Chest: Breath sounds normal. No respiratory distress. She has no wheezes.  Abdominal: Soft. Bowel sounds are normal. There is no tenderness.  Musculoskeletal: She exhibits no tenderness.  Lower extremity edema and pedal edema.    Lymphadenopathy:    She has no cervical adenopathy.  Skin:  Improved erythema.  Some stasis changes present.  Small open lesion  - lower leg.    Psychiatric: She has a normal mood and affect. Her behavior is normal.    BP 130/88   Pulse 82   Temp 98.3 F (36.8 C) (Oral)   Ht '5\' 6"'  (1.676 m)   Wt 237 lb 3.2 oz (107.6 kg)   LMP 05/25/1988   SpO2 95%   BMI 38.29 kg/m  Wt Readings from Last 3 Encounters:  04/14/16 237 lb 3.2 oz (107.6 kg)  03/21/16 239 lb (108.4 kg)  03/20/16 239 lb (108.4 kg)     Lab Results  Component Value Date   WBC 6.2 08/29/2015   HGB 14.4 08/29/2015   HCT 44.2 08/29/2015   PLT 205.0 08/29/2015   GLUCOSE 164 (H) 04/14/2016   CHOL 158 12/11/2015   TRIG 170.0 (H) 12/11/2015   HDL 35.50 (L) 12/11/2015   LDLCALC 88 12/11/2015   ALT 25 12/11/2015   AST 24 12/11/2015   NA 137  04/14/2016   K 4.4 04/14/2016   CL 101 04/14/2016   CREATININE 0.97 04/14/2016   BUN 18 04/14/2016   CO2 29 04/14/2016   TSH 4.04 04/09/2016   HGBA1C 6.5 12/11/2015   MICROALBUR 8.9 (H) 01/09/2015    Mm Screening Breast Tomo Bilateral  Result Date: 12/05/2015 CLINICAL DATA:  Screening. EXAM: 2D DIGITAL SCREENING BILATERAL MAMMOGRAM WITH CAD AND ADJUNCT TOMO COMPARISON:  Previous exam(s). ACR Breast Density  Category b: There are scattered areas of fibroglandular density. FINDINGS: There are no findings suspicious for malignancy. Images were processed with CAD. IMPRESSION: No mammographic evidence of malignancy. A result letter of this screening mammogram will be mailed directly to the patient. RECOMMENDATION: Screening mammogram in one year. (Code:SM-B-01Y) BI-RADS CATEGORY  1: Negative. Electronically Signed   By: Franki Cabot M.D.   On: 12/05/2015 14:14      Assessment & Plan:   Problem List Items Addressed This Visit    Diabetes mellitus ([place])    Low carb diet and exercise.  Follow met b and a1c.        GERD (gastroesophageal reflux disease)    Continue prilosec.  Follow.   Had EGD 05/2012.  Recommended f/u EGD in 05/2015.  Need to confirm had this performed.        History of colon polyps    Colonoscopy 05/14/12.  Recommended f/u colonoscopy in 05/2017.        Hypercholesterolemia    Low cholesterol diet and exercise. Follow lipid panel.        Hypertension    Blood pressure as outlined.  Same medication regimen.  Follow pressures.  Follow metabolic panel.        Obesity (BMI 30-39.9)    Diet and exercise.  Follow.       Rheumatoid arthritis ([place])    Followed by Dr Jefm Bryant.  Stable.        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  Wearing compression hose.  Saw vascular surgery here in [place].  Has made herself an appt with dr Aleda Grana in [place].  Wants a second opinion.  Follow.  Naprosyn helping with pain.           Other Visit Diagnoses    Elevated serum creatinine    -  Primary   Relevant Orders   Basic metabolic panel (Completed)   Encounter for immunization       Relevant Orders   Flu vaccine HIGH DOSE PF (Completed)       Einar Pheasant, MD

## 2016-04-14 NOTE — Progress Notes (Signed)
Pre visit review using our clinic review tool, if applicable. No additional management support is needed unless otherwise documented below in the visit note. 

## 2016-04-15 ENCOUNTER — Encounter: Payer: Self-pay | Admitting: Internal Medicine

## 2016-04-20 ENCOUNTER — Encounter: Payer: Self-pay | Admitting: Internal Medicine

## 2016-04-20 NOTE — Assessment & Plan Note (Signed)
Colonoscopy 05/14/12.  Recommended f/u colonoscopy in 05/2017.

## 2016-04-20 NOTE — Assessment & Plan Note (Signed)
Low carb diet and exercise.  Follow met b and a1c.

## 2016-04-20 NOTE — Assessment & Plan Note (Signed)
Blood pressure as outlined.  Same medication regimen.  Follow pressures.  Follow metabolic panel.  

## 2016-04-20 NOTE — Assessment & Plan Note (Signed)
Low cholesterol diet and exercise.  Follow lipid panel.   

## 2016-04-20 NOTE — Assessment & Plan Note (Signed)
Followed by Dr Kernodle.  Stable.  

## 2016-04-20 NOTE — Assessment & Plan Note (Addendum)
Continue prilosec.  Follow.   Had EGD 05/2012.  Recommended f/u EGD in 05/2015.  Need to confirm had this performed.

## 2016-04-20 NOTE — Assessment & Plan Note (Signed)
Diet and exercise.  Follow.  

## 2016-04-20 NOTE — Assessment & Plan Note (Signed)
Some changes c/w venostasis changes.  Wearing compression hose.  Saw vascular surgery here in [place].  Has made herself an appt with dr Aleda Grana in [place].  Wants a second opinion.  Follow.  Naprosyn helping with pain.

## 2016-04-29 ENCOUNTER — Encounter (INDEPENDENT_AMBULATORY_CARE_PROVIDER_SITE_OTHER): Payer: Medicare Other

## 2016-04-29 ENCOUNTER — Ambulatory Visit (INDEPENDENT_AMBULATORY_CARE_PROVIDER_SITE_OTHER): Payer: Medicare Other | Admitting: Vascular Surgery

## 2016-04-29 ENCOUNTER — Encounter: Payer: Self-pay | Admitting: Internal Medicine

## 2016-05-06 ENCOUNTER — Other Ambulatory Visit: Payer: Self-pay | Admitting: Internal Medicine

## 2016-05-12 DIAGNOSIS — C4490 Unspecified malignant neoplasm of skin, unspecified: Secondary | ICD-10-CM

## 2016-05-12 DIAGNOSIS — C801 Malignant (primary) neoplasm, unspecified: Secondary | ICD-10-CM

## 2016-05-12 HISTORY — DX: Unspecified malignant neoplasm of skin, unspecified: C44.90

## 2016-05-12 HISTORY — DX: Malignant (primary) neoplasm, unspecified: C80.1

## 2016-05-13 ENCOUNTER — Other Ambulatory Visit: Payer: Self-pay | Admitting: Internal Medicine

## 2016-06-12 ENCOUNTER — Ambulatory Visit: Payer: Medicare Other

## 2016-06-16 ENCOUNTER — Other Ambulatory Visit: Payer: Self-pay | Admitting: Internal Medicine

## 2016-06-25 ENCOUNTER — Encounter: Payer: Self-pay | Admitting: Internal Medicine

## 2016-06-25 ENCOUNTER — Ambulatory Visit (INDEPENDENT_AMBULATORY_CARE_PROVIDER_SITE_OTHER): Payer: Medicare Other | Admitting: Internal Medicine

## 2016-06-25 VITALS — BP 126/78 | HR 74 | Temp 98.3°F | Resp 18 | Ht 66.0 in | Wt 237.6 lb

## 2016-06-25 DIAGNOSIS — E7800 Pure hypercholesterolemia, unspecified: Secondary | ICD-10-CM | POA: Diagnosis not present

## 2016-06-25 DIAGNOSIS — M069 Rheumatoid arthritis, unspecified: Secondary | ICD-10-CM | POA: Diagnosis not present

## 2016-06-25 DIAGNOSIS — I10 Essential (primary) hypertension: Secondary | ICD-10-CM | POA: Diagnosis not present

## 2016-06-25 DIAGNOSIS — E669 Obesity, unspecified: Secondary | ICD-10-CM | POA: Diagnosis not present

## 2016-06-25 DIAGNOSIS — E119 Type 2 diabetes mellitus without complications: Secondary | ICD-10-CM | POA: Diagnosis not present

## 2016-06-25 DIAGNOSIS — I872 Venous insufficiency (chronic) (peripheral): Secondary | ICD-10-CM

## 2016-06-25 LAB — CBC WITH DIFFERENTIAL/PLATELET
BASOS ABS: 0.1 10*3/uL (ref 0.0–0.1)
BASOS PCT: 1.3 % (ref 0.0–3.0)
EOS ABS: 0.1 10*3/uL (ref 0.0–0.7)
Eosinophils Relative: 1.4 % (ref 0.0–5.0)
HCT: 44 % (ref 36.0–46.0)
HEMOGLOBIN: 14.2 g/dL (ref 12.0–15.0)
LYMPHS PCT: 20.3 % (ref 12.0–46.0)
Lymphs Abs: 1.2 10*3/uL (ref 0.7–4.0)
MCHC: 32.3 g/dL (ref 30.0–36.0)
MCV: 101 fl — ABNORMAL HIGH (ref 78.0–100.0)
MONO ABS: 0.6 10*3/uL (ref 0.1–1.0)
Monocytes Relative: 10 % (ref 3.0–12.0)
Neutro Abs: 4.1 10*3/uL (ref 1.4–7.7)
Neutrophils Relative %: 67 % (ref 43.0–77.0)
Platelets: 214 10*3/uL (ref 150.0–400.0)
RBC: 4.36 Mil/uL (ref 3.87–5.11)
RDW: 17.7 % — AB (ref 11.5–15.5)
WBC: 6 10*3/uL (ref 4.0–10.5)

## 2016-06-25 LAB — MICROALBUMIN / CREATININE URINE RATIO
Creatinine,U: 258.8 mg/dL
Microalb Creat Ratio: 5.1 mg/g (ref 0.0–30.0)
Microalb, Ur: 13.2 mg/dL — ABNORMAL HIGH (ref 0.0–1.9)

## 2016-06-25 LAB — LIPID PANEL
CHOL/HDL RATIO: 4
CHOLESTEROL: 165 mg/dL (ref 0–200)
HDL: 38.9 mg/dL — ABNORMAL LOW (ref >39.00)
LDL CALC: 88 mg/dL (ref 0–99)
NonHDL: 125.85
TRIGLYCERIDES: 190 mg/dL — AB (ref 0.0–149.0)
VLDL: 38 mg/dL (ref 0.0–40.0)

## 2016-06-25 LAB — BASIC METABOLIC PANEL
BUN: 16 mg/dL (ref 6–23)
CALCIUM: 9.9 mg/dL (ref 8.4–10.5)
CO2: 31 mEq/L (ref 19–32)
CREATININE: 0.97 mg/dL (ref 0.40–1.20)
Chloride: 103 mEq/L (ref 96–112)
GFR: 59.49 mL/min — AB (ref >60.00)
GLUCOSE: 136 mg/dL — AB (ref 70–99)
Potassium: 4.8 mEq/L (ref 3.5–5.1)
Sodium: 137 mEq/L (ref 135–145)

## 2016-06-25 LAB — HEMOGLOBIN A1C: HEMOGLOBIN A1C: 6.5 % (ref 4.6–6.5)

## 2016-06-25 LAB — HEPATIC FUNCTION PANEL
ALT: 23 U/L (ref 0–35)
AST: 21 U/L (ref 0–37)
Albumin: 4.2 g/dL (ref 3.5–5.2)
Alkaline Phosphatase: 75 U/L (ref 39–117)
BILIRUBIN DIRECT: 0.1 mg/dL (ref 0.0–0.3)
BILIRUBIN TOTAL: 0.6 mg/dL (ref 0.2–1.2)
Total Protein: 7.3 g/dL (ref 6.0–8.3)

## 2016-06-25 LAB — TSH: TSH: 3.65 u[IU]/mL (ref 0.35–4.50)

## 2016-06-25 NOTE — Progress Notes (Signed)
Pre-visit discussion using our clinic review tool. No additional management support is needed unless otherwise documented below in the visit note.foll

## 2016-06-25 NOTE — Progress Notes (Signed)
Patient ID: Nina Perez, female   DOB: 16-Oct-1940, 76 y.o.   MRN: [id number]   Subjective:    Patient ID: Nina Perez, female    DOB: July 08, 1940, 76 y.o.   MRN: [id number]  HPI  Patient here for a scheduled follow up.  States she is doing better.  Leg swelling is better.  Wore unna boot for a while.  Seeing vascular surgery.  Pain better.  Overall feels better.  No chest pain.  No sob.  No acid reflux.  No abdominal pain or cramping.  Bowels stable.  Blood sugar yesterday 109.     Past Medical History:  Diagnosis Date  . Barrett's esophagus   . Diabetes mellitus ([place])   . Gout   . History of colon cancer    adenomatous polyps  . Hypercholesterolemia   . Hypertension   . Inflammatory arthritis   . Osteoarthritis    knees, spine  . Phlebitis    x2 (with pregnancy)  . 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 Pulmonary hypertension   . Reflux esophagitis   . Rheumatoid arthritis(714.0)    positive RF, FANA, RNP, negative CCP ab and anti DNA,.  neg anti-SCL 70  . 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 BREAST CYST ASPIRATION Left   . VEIN LIGATION AND STRIPPING     Family History  Problem Relation Age of Onset  . Arthritis Mother   . Heart attack Father   . Colon cancer Neg Hx    Social History   Social History  . Marital status: Married    Spouse name: N/A  . Number of children: N/A  . Years of education: N/A   Social History Main Topics  . Smoking status: Former Smoker    Packs/day: 0.50    Years: 15.00    Quit date: 05/12/1988  . Smokeless tobacco: Never Used  . Alcohol use No  . Drug use: No  . Sexual activity: Yes   Other Topics Concern  . None   Social History Narrative  . None    Outpatient Encounter Prescriptions as of 06/25/2016  Medication Sig  . allopurinol (ZYLOPRIM) 300 MG tablet TAKE ONE TABLET BY MOUTH ONCE DAILY  . amLODipine (NORVASC) 10 MG tablet TAKE ONE TABLET  BY MOUTH ONCE DAILY  . aspirin 81 MG tablet Take 81 mg by mouth daily. Reported on 07/24/2015  . fluticasone (FLONASE) 50 MCG/ACT nasal spray Place 2 sprays into the nose daily.  . folic acid (FOLVITE) 1 MG tablet Take 1 mg by mouth daily.  Marland Kitchen glucose blood test strip Contour test strips-use to test blood sugars twice a day. Dx E11.9  . lisinopril (PRINIVIL,ZESTRIL) 40 MG tablet TAKE ONE TABLET BY MOUTH ONCE DAILY  . metFORMIN (GLUCOPHAGE) 500 MG tablet TAKE ONE TABLET BY MOUTH TWICE DAILY WITH MEALS  . methotrexate 2.5 MG tablet Take 6 tablets q week  . metoprolol succinate (TOPROL-XL) 50 MG 24 hr tablet TAKE ONE TABLET BY MOUTH ONCE DAILY IMMEDIATELY  FOLLOWING  A  MEAL  . MICROLET LANCETS MISC Check sugar once daily, [employer] Microlet Lancets. Dx E11.9  . mupirocin ointment (BACTROBAN) 2 % Place 1 application into the nose 2 (two) times daily.  . naproxen (NAPROSYN) 500 MG tablet Take 500 mg by mouth 2 (two) times daily as needed.  Marland Kitchen omeprazole (PRILOSEC) 20 MG capsule Take 20 mg by mouth daily.  Marland Kitchen spironolactone (  ALDACTONE) 25 MG tablet TAKE ONE TABLET BY MOUTH ONCE DAILY  . traMADol (ULTRAM) 50 MG tablet Take 1 tablet (50 mg total) by mouth 2 (two) times daily as needed.   No facility-administered encounter medications on file as of 06/25/2016.     Review of Systems  Constitutional: Negative for appetite change and unexpected weight change.  HENT: Negative for congestion and sinus pressure.   Respiratory: Negative for cough, chest tightness and shortness of breath.   Cardiovascular: Negative for chest pain and palpitations.       Leg swelling better.    Gastrointestinal: Negative for abdominal pain, diarrhea, nausea and vomiting.  Genitourinary: Negative for difficulty urinating and dysuria.  Musculoskeletal: Negative for back pain and joint swelling.  Skin: Negative for color change and rash.  Neurological: Negative for dizziness, light-headedness and headaches.    Psychiatric/Behavioral: Negative for agitation and dysphoric mood.       Objective:    Physical Exam  Constitutional: She appears well-developed and well-nourished. No distress.  HENT:  Nose: Nose normal.  Mouth/Throat: Oropharynx is clear and moist.  Neck: Neck supple. No thyromegaly present.  Cardiovascular: Normal rate and regular rhythm.   Pulmonary/Chest: Breath sounds normal. No respiratory distress. She has no wheezes.  Abdominal: Soft. Bowel sounds are normal. There is no tenderness.  Musculoskeletal: She exhibits no tenderness.  Lower extremity swelling - improved.    Lymphadenopathy:    She has no cervical adenopathy.  Skin: No rash noted. No erythema.  Psychiatric: She has a normal mood and affect. Her behavior is normal.    BP 126/78 (BP Location: Left Arm, Patient Position: Sitting, Cuff Size: Large)   Pulse 74   Temp 98.3 F (36.8 C) (Oral)   Resp 18   Ht '5\' 6"'  (1.676 m)   Wt 237 lb 9.6 oz (107.8 kg)   LMP 05/25/1988   SpO2 93%   BMI 38.35 kg/m  Wt Readings from Last 3 Encounters:  06/25/16 237 lb 9.6 oz (107.8 kg)  04/14/16 237 lb 3.2 oz (107.6 kg)  03/21/16 239 lb (108.4 kg)     Lab Results  Component Value Date   WBC 6.0 06/25/2016   HGB 14.2 06/25/2016   HCT 44.0 06/25/2016   PLT 214.0 06/25/2016   GLUCOSE 136 (H) 06/25/2016   CHOL 165 06/25/2016   TRIG 190.0 (H) 06/25/2016   HDL 38.90 (L) 06/25/2016   LDLCALC 88 06/25/2016   ALT 23 06/25/2016   AST 21 06/25/2016   NA 137 06/25/2016   K 4.8 06/25/2016   CL 103 06/25/2016   CREATININE 0.97 06/25/2016   BUN 16 06/25/2016   CO2 31 06/25/2016   TSH 3.65 06/25/2016   HGBA1C 6.5 06/25/2016   MICROALBUR 13.2 (H) 06/25/2016    Mm Screening Breast Tomo Bilateral  Result Date: 12/05/2015 CLINICAL DATA:  Screening. EXAM: 2D DIGITAL SCREENING BILATERAL MAMMOGRAM WITH CAD AND ADJUNCT TOMO COMPARISON:  Previous exam(s). ACR Breast Density Category b: There are scattered areas of fibroglandular  density. FINDINGS: There are no findings suspicious for malignancy. Images were processed with CAD. IMPRESSION: No mammographic evidence of malignancy. A result letter of this screening mammogram will be mailed directly to the patient. RECOMMENDATION: Screening mammogram in one year. (Code:SM-B-01Y) BI-RADS CATEGORY  1: Negative. Electronically Signed   By: Franki Cabot M.D.   On: 12/05/2015 14:14      Assessment & Plan:   Problem List Items Addressed This Visit    Diabetes mellitus ([place])  Low carb diet and exercise.  Follow met b and a1c.        Relevant Orders   Hemoglobin A1c (Completed)   Basic metabolic panel (Completed)   Microalbumin / creatinine urine ratio (Completed)   Hypercholesterolemia    Low cholesterol diet and exercise.  Follow lipid panel.        Relevant Orders   Hepatic function panel (Completed)   Lipid panel (Completed)   Hypertension    Blood pressure under good control.  Continue same medication regimen.  Follow pressures.  Follow metabolic panel.        Relevant Orders   CBC with Differential/Platelet (Completed)   TSH (Completed)   Obesity (BMI 30-39.9)    Diet and exercise.  Follow.        Rheumatoid arthritis ([place]) - Primary    Followed by Dr Jefm Bryant.  Stable.        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  Previous unna boots.  Compression hose.  Improved.  Follow.            Einar Pheasant, MD

## 2016-06-26 ENCOUNTER — Encounter: Payer: Self-pay | Admitting: Internal Medicine

## 2016-07-06 ENCOUNTER — Encounter: Payer: Self-pay | Admitting: Internal Medicine

## 2016-07-06 NOTE — Assessment & Plan Note (Signed)
Low cholesterol diet and exercise.  Follow lipid panel.   

## 2016-07-06 NOTE — Assessment & Plan Note (Signed)
Diet and exercise.  Follow.  

## 2016-07-06 NOTE — Assessment & Plan Note (Signed)
Blood pressure under good control.  Continue same medication regimen.  Follow pressures.  Follow metabolic panel.   

## 2016-07-06 NOTE — Assessment & Plan Note (Signed)
Low carb diet and exercise.  Follow met b and a1c.   

## 2016-07-06 NOTE — Assessment & Plan Note (Signed)
Followed by Dr Kernodle.  Stable.  

## 2016-07-06 NOTE — Assessment & Plan Note (Signed)
Saw vascular surgery.  Previous unna boots.  Compression hose.  Improved.  Follow.

## 2016-07-14 ENCOUNTER — Other Ambulatory Visit: Payer: Self-pay | Admitting: Internal Medicine

## 2016-07-21 ENCOUNTER — Other Ambulatory Visit: Payer: Self-pay | Admitting: Internal Medicine

## 2016-07-22 NOTE — Telephone Encounter (Signed)
ok'd rx for metoprolol #90 with one refill.

## 2016-07-23 ENCOUNTER — Ambulatory Visit (INDEPENDENT_AMBULATORY_CARE_PROVIDER_SITE_OTHER): Payer: Medicare Other | Admitting: Family

## 2016-07-23 ENCOUNTER — Encounter: Payer: Self-pay | Admitting: Family

## 2016-07-23 VITALS — BP 148/88 | HR 92 | Temp 98.0°F | Ht 66.0 in | Wt 235.2 lb

## 2016-07-23 DIAGNOSIS — J060 Acute laryngopharyngitis: Secondary | ICD-10-CM | POA: Diagnosis not present

## 2016-07-23 DIAGNOSIS — I10 Essential (primary) hypertension: Secondary | ICD-10-CM

## 2016-07-23 MED ORDER — LIDOCAINE VISCOUS 2 % MT SOLN: 15.0000 mL | OROMUCOSAL | 0 refills | Status: DC | PRN

## 2016-07-23 MED ORDER — AMOXICILLIN 500 MG PO CAPS: 500.0000 mg | ORAL_CAPSULE | Freq: Two times a day (BID) | ORAL | 0 refills | Status: DC

## 2016-07-23 NOTE — Assessment & Plan Note (Addendum)
Elevated today which is atypical for patient. No CP, SOB. Patient declined increasing metoprolol as suspects related to illness. She will keep BP log for next couple of days and notify us immediately if continues to run greater than 140/90.

## 2016-07-23 NOTE — Progress Notes (Signed)
Subjective:    Patient ID: Nina Perez, female    DOB: 1941-03-21, 76 y.o.   MRN: [id number]  CC: Nina Perez is a 76 y.o. female who presents today for an acute visit.    HPI: Chief complaint sore throat. Inittialy started with 5 days, unchanged. Has hoarseness, thick decongestion. No fever, shortness of breath, wheezing. Has been using delsym with relief.   Husband has similar cough.   HTN- compliant with medications. Thinks high 'because i'm sick.' Denies exertional chest pain or pressure, numbness or tingling radiating to left arm or jaw, palpitations, dizziness, frequent headaches, changes in vision, or shortness of breath.    Former smoker    HISTORY:  Past Medical History:  Diagnosis Date  . Barrett's esophagus   . Diabetes mellitus ([place])   . Gout   . History of colon cancer    adenomatous polyps  . Hypercholesterolemia   . Hypertension   . Inflammatory arthritis   . Osteoarthritis    knees, spine  . Phlebitis    x2 (with pregnancy)  . 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 Pulmonary hypertension   . Reflux esophagitis   . Rheumatoid arthritis(714.0)    positive RF, FANA, RNP, negative CCP ab and anti DNA,.  neg anti-SCL 70  . 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 BREAST CYST ASPIRATION Left   . VEIN LIGATION AND STRIPPING     Family History  Problem Relation Age of Onset  . Arthritis Mother   . Heart attack Father   . Colon cancer Neg Hx     Allergies: Maxzide [[id number]] Current Outpatient Prescriptions on File Prior to Visit  Medication Sig Dispense Refill  . allopurinol (ZYLOPRIM) 300 MG tablet TAKE ONE TABLET BY MOUTH ONCE DAILY 30 tablet 5  . amLODipine (NORVASC) 10 MG tablet TAKE ONE TABLET BY MOUTH ONCE DAILY 90 tablet 3  . aspirin 81 MG tablet Take 81 mg by mouth daily. Reported on 07/24/2015    . fluticasone (FLONASE) 50 MCG/ACT nasal spray Place 2 sprays  into the nose daily. 16 g 1  . folic acid (FOLVITE) 1 MG tablet Take 1 mg by mouth daily.    Marland Kitchen glucose blood test strip Contour test strips-use to test blood sugars twice a day. Dx E11.9 100 each 12  . lisinopril (PRINIVIL,ZESTRIL) 40 MG tablet TAKE ONE TABLET BY MOUTH ONCE DAILY 90 tablet 2  . metFORMIN (GLUCOPHAGE) 500 MG tablet TAKE ONE TABLET BY MOUTH TWICE DAILY WITH MEALS 60 tablet 5  . methotrexate 2.5 MG tablet Take 6 tablets q week    . metoprolol succinate (TOPROL-XL) 50 MG 24 hr tablet TAKE ONE TABLET BY MOUTH ONCE DAILY IMMEDIATELY  FOLLOWING  A  MEAL 90 tablet 1  . MICROLET LANCETS MISC Check sugar once daily, [employer] Microlet Lancets. Dx E11.9 100 each 12  . mupirocin ointment (BACTROBAN) 2 % Place 1 application into the nose 2 (two) times daily. 22 g 0  . naproxen (NAPROSYN) 500 MG tablet Take 500 mg by mouth 2 (two) times daily as needed.    Marland Kitchen omeprazole (PRILOSEC) 20 MG capsule Take 20 mg by mouth daily.    Marland Kitchen spironolactone (ALDACTONE) 25 MG tablet TAKE ONE TABLET BY MOUTH ONCE DAILY 30 tablet 5  . traMADol (ULTRAM) 50 MG tablet Take 1 tablet (50 mg total) by mouth 2 (two) times daily as needed. 30 tablet  0   No current facility-administered medications on file prior to visit.     Social History  Substance Use Topics  . Smoking status: Former Smoker    Packs/day: 0.50    Years: 15.00    Quit date: 05/12/1988  . Smokeless tobacco: Never Used  . Alcohol use No    Review of Systems  Constitutional: Negative for chills and fever.  HENT: Positive for congestion, sore throat, trouble swallowing and voice change. Negative for ear pain and sinus pain.   Respiratory: Positive for cough. Negative for shortness of breath and wheezing.   Cardiovascular: Negative for chest pain and palpitations.  Gastrointestinal: Negative for nausea and vomiting.  Neurological: Negative for headaches.      Objective:    BP (!) 148/88   Pulse 92   Temp 98 F (36.7 C) (Oral)   Ht 5\' 6"   (1.676 m)   Wt 235 lb 3.2 oz (106.7 kg)   LMP 05/25/1988   SpO2 94%   BMI 37.96 kg/m   BP Readings from Last 3 Encounters:  07/23/16 (!) 148/88  06/25/16 126/78  04/14/16 130/88    Physical Exam  Constitutional: She appears well-developed and well-nourished.  HENT:  Head: Normocephalic and atraumatic.  Right Ear: Hearing, tympanic membrane, external ear and ear canal normal. No drainage, swelling or tenderness. No foreign bodies. Tympanic membrane is not erythematous and not bulging. No middle ear effusion. No decreased hearing is noted.  Left Ear: Hearing, tympanic membrane, external ear and ear canal normal. No drainage, swelling or tenderness. No foreign bodies. Tympanic membrane is not erythematous and not bulging.  No middle ear effusion. No decreased hearing is noted.  Nose: Nose normal. No rhinorrhea. Right sinus exhibits no maxillary sinus tenderness and no frontal sinus tenderness. Left sinus exhibits no maxillary sinus tenderness and no frontal sinus tenderness.  Mouth/Throat: Uvula is midline, oropharynx is clear and moist and mucous membranes are normal. No oropharyngeal exudate, posterior oropharyngeal edema, posterior oropharyngeal erythema or tonsillar abscesses.  Eyes: Conjunctivae are normal.  Cardiovascular: Regular rhythm, normal heart sounds and normal pulses.   Pulmonary/Chest: Effort normal and breath sounds normal. She has no wheezes. She has no rhonchi. She has no rales.  Lymphadenopathy:       Head (right side): No submental, no submandibular, no tonsillar, no preauricular, no posterior auricular and no occipital adenopathy present.       Head (left side): No submental, no submandibular, no tonsillar, no preauricular, no posterior auricular and no occipital adenopathy present.    She has no cervical adenopathy.  Neurological: She is alert.  Skin: Skin is warm and dry.  Psychiatric: She has a normal mood and affect. Her speech is normal and behavior is normal.  Thought content normal.  Vitals reviewed.      Assessment & Plan:   Problem List Items Addressed This Visit      Cardiovascular and Mediastinum   Hypertension    Elevated today which is atypical for patient. No CP, SOB. Patient declined increasing metoprolol as suspects related to illness. She will keep BP log for next couple of days and notify us immediately if continues to run greater than 140/90.         Respiratory   Sore throat and laryngitis - Primary    Strep negative. Working diagnosis of viral URI x  5days. No acute respiratory distress. Afebrile. Patient and I agreed upon conservative therapy at this time with symptom management, close observation, and delayed antibiotic treatment.  She understands only to fill amoxicillin if conservative treatment fails in the next 1-3 days.        Relevant Medications   lidocaine (XYLOCAINE) 2 % solution   amoxicillin (AMOXIL) 500 MG capsule       I am having Ms. Rhudy start on lidocaine and amoxicillin. I am also having her maintain her omeprazole, aspirin, naproxen, folic acid, methotrexate, fluticasone, MICROLET LANCETS, glucose blood, metFORMIN, spironolactone, traMADol, mupirocin ointment, allopurinol, lisinopril, amLODipine, and metoprolol succinate.   Meds ordered this encounter  Medications  . lidocaine (XYLOCAINE) 2 % solution    Sig: Use as directed 15 mLs in the mouth or throat every 3 (three) hours as needed for mouth pain (gargle; may spit or swallow).    Dispense:  100 mL    Refill:  0    Order Specific Question:   Supervising Provider    Answer:   Deborra Medina L [[id number]]  . amoxicillin (AMOXIL) 500 MG capsule    Sig: Take 1 capsule (500 mg total) by mouth 2 (two) times daily.    Dispense:  14 capsule    Refill:  0    Order Specific Question:   Supervising Provider    Answer:   Crecencio Mc [[id number]]    Return precautions given.   Risks, benefits, and alternatives of the medications and treatment plan prescribed  today were discussed, and patient expressed understanding.   Education regarding symptom management and diagnosis given to patient on AVS.  Continue to follow with Einar Pheasant, MD for routine health maintenance.   [name] and I agreed with plan.   Mable Paris, FNP

## 2016-07-23 NOTE — Assessment & Plan Note (Signed)
Strep negative. Working diagnosis of viral URI x  5days. No acute respiratory distress. Afebrile. Patient and I agreed upon conservative therapy at this time with symptom management, close observation, and delayed antibiotic treatment. She understands only to fill amoxicillin if conservative treatment fails in the next 1-3 days.

## 2016-07-23 NOTE — Progress Notes (Signed)
Pre visit review using our clinic review tool, if applicable. No additional management support is needed unless otherwise documented below in the visit note. 

## 2016-07-23 NOTE — Patient Instructions (Addendum)
Monitor BP at home; if persistently higher than 140/90 than please let us know immediately. May need to increase medication.     Start mucinex  Use lidocaine swish and swallow  I suspect that your infection is viral in nature.  As discussed, I advise that you wait to fill the antibiotic after 1-2 days of symptom management to see if your symptoms improve. If you do not show improvement, you may take the antibiotic as prescribed.   Increase intake of clear fluids. Congestion is best treated by hydration, when mucus is wetter, it is thinner, less sticky, and easier to expel from the body, either through coughing up drainage, or by blowing your nose.   Get plenty of rest.   Use saline nasal drops and blow your nose frequently. Run a humidifier at night and elevate the head of the bed. Vicks Vapor rub will help with congestion and cough. Steam showers and sinus massage for congestion.   Use Acetaminophen or Ibuprofen as needed for fever or pain. Avoid second hand smoke. Even the smallest exposure will worsen symptoms.  .  You can also try a teaspoon of honey to see if this will help reduce cough. Throat lozenges can sometimes be beneficial as well.    This illness will typically last 7 - 10 days.   Please follow up with our clinic if you develop a fever greater than 101 F, symptoms worsen, or do not resolve in the next week.

## 2016-10-14 ENCOUNTER — Other Ambulatory Visit: Payer: Self-pay | Admitting: Internal Medicine

## 2016-11-03 ENCOUNTER — Encounter: Payer: Self-pay | Admitting: Internal Medicine

## 2016-11-03 ENCOUNTER — Ambulatory Visit (INDEPENDENT_AMBULATORY_CARE_PROVIDER_SITE_OTHER): Payer: Medicare Other | Admitting: Internal Medicine

## 2016-11-03 VITALS — BP 138/82 | HR 79 | Temp 98.6°F | Resp 12 | Ht 66.0 in | Wt 232.0 lb

## 2016-11-03 DIAGNOSIS — D7589 Other specified diseases of blood and blood-forming organs: Secondary | ICD-10-CM | POA: Diagnosis not present

## 2016-11-03 DIAGNOSIS — E669 Obesity, unspecified: Secondary | ICD-10-CM

## 2016-11-03 DIAGNOSIS — I872 Venous insufficiency (chronic) (peripheral): Secondary | ICD-10-CM

## 2016-11-03 DIAGNOSIS — K2100 Gastro-esophageal reflux disease with esophagitis, without bleeding: Secondary | ICD-10-CM

## 2016-11-03 DIAGNOSIS — E119 Type 2 diabetes mellitus without complications: Secondary | ICD-10-CM

## 2016-11-03 DIAGNOSIS — L989 Disorder of the skin and subcutaneous tissue, unspecified: Secondary | ICD-10-CM

## 2016-11-03 DIAGNOSIS — I10 Essential (primary) hypertension: Secondary | ICD-10-CM | POA: Diagnosis not present

## 2016-11-03 DIAGNOSIS — M069 Rheumatoid arthritis, unspecified: Secondary | ICD-10-CM

## 2016-11-03 DIAGNOSIS — E7800 Pure hypercholesterolemia, unspecified: Secondary | ICD-10-CM | POA: Diagnosis not present

## 2016-11-03 LAB — CBC WITH DIFFERENTIAL/PLATELET
Basophils Absolute: 0.1 10*3/uL (ref 0.0–0.1)
Basophils Relative: 1.2 % (ref 0.0–3.0)
EOS ABS: 0.1 10*3/uL (ref 0.0–0.7)
EOS PCT: 1.6 % (ref 0.0–5.0)
HEMATOCRIT: 43.9 % (ref 36.0–46.0)
HEMOGLOBIN: 14.5 g/dL (ref 12.0–15.0)
LYMPHS PCT: 17.3 % (ref 12.0–46.0)
Lymphs Abs: 1.1 10*3/uL (ref 0.7–4.0)
MCHC: 32.9 g/dL (ref 30.0–36.0)
MCV: 101.6 fl — AB (ref 78.0–100.0)
Monocytes Absolute: 0.3 10*3/uL (ref 0.1–1.0)
Monocytes Relative: 5 % (ref 3.0–12.0)
NEUTROS ABS: 4.7 10*3/uL (ref 1.4–7.7)
Neutrophils Relative %: 74.9 % (ref 43.0–77.0)
PLATELETS: 210 10*3/uL (ref 150.0–400.0)
RBC: 4.32 Mil/uL (ref 3.87–5.11)
RDW: 18.6 % — AB (ref 11.5–15.5)
WBC: 6.3 10*3/uL (ref 4.0–10.5)

## 2016-11-03 LAB — LIPID PANEL
CHOLESTEROL: 165 mg/dL (ref 0–200)
HDL: 35.4 mg/dL — ABNORMAL LOW (ref >39.00)
LDL CALC: 97 mg/dL (ref 0–99)
NonHDL: 129.41
TRIGLYCERIDES: 164 mg/dL — AB (ref 0.0–149.0)
Total CHOL/HDL Ratio: 5
VLDL: 32.8 mg/dL (ref 0.0–40.0)

## 2016-11-03 LAB — HEPATIC FUNCTION PANEL
ALBUMIN: 4.2 g/dL (ref 3.5–5.2)
ALT: 32 U/L (ref 0–35)
AST: 28 U/L (ref 0–37)
Alkaline Phosphatase: 74 U/L (ref 39–117)
Bilirubin, Direct: 0.1 mg/dL (ref 0.0–0.3)
TOTAL PROTEIN: 7.5 g/dL (ref 6.0–8.3)
Total Bilirubin: 0.7 mg/dL (ref 0.2–1.2)

## 2016-11-03 LAB — HEMOGLOBIN A1C: Hgb A1c MFr Bld: 6.5 % (ref 4.6–6.5)

## 2016-11-03 LAB — BASIC METABOLIC PANEL
BUN: 20 mg/dL (ref 6–23)
CHLORIDE: 101 meq/L (ref 96–112)
CO2: 30 mEq/L (ref 19–32)
Calcium: 10.8 mg/dL — ABNORMAL HIGH (ref 8.4–10.5)
Creatinine, Ser: 1.21 mg/dL — ABNORMAL HIGH (ref 0.40–1.20)
GFR: 46.05 mL/min — AB (ref >60.00)
GLUCOSE: 116 mg/dL — AB (ref 70–99)
POTASSIUM: 5 meq/L (ref 3.5–5.1)
SODIUM: 136 meq/L (ref 135–145)

## 2016-11-03 LAB — HM DIABETES EYE EXAM

## 2016-11-03 NOTE — Progress Notes (Signed)
Pre-visit discussion using our clinic review tool. No additional management support is needed unless otherwise documented below in the visit note.  

## 2016-11-03 NOTE — Progress Notes (Signed)
Patient ID: Nina Perez, female   DOB: 04/14/1941, 76 y.o.   MRN: [id number]   Subjective:    Patient ID: Nina Perez, female    DOB: 1940/10/16, 76 y.o.   MRN: [id number]  HPI  Patient here for a scheduled follow up.  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  She is wearing compression hose.  Has a new place on her right lower leg.  Has been more red.  Over the last couple of days has become more firm.  Tender to touch.  Does not extend up her leg, but has increased some in size since first noticed.  Has been present for a while, but has just recently become more firm.  No increased swelling.  Swelling improved with hose.  States otherwise doing relatively well.  Sugars under good control.  AM sugars reviewed and averaging 90-120.  No problems with low sugars reported.  No chest pain.  Breathing stable.  No sob.  No acid reflux.  No abdominal pain.  Bowels moving.  Discussed diet and exercise.     Past Medical History:  Diagnosis Date  . Barrett's esophagus   . Diabetes mellitus ([place])   . Gout   . History of colon cancer    adenomatous polyps  . Hypercholesterolemia   . Hypertension   . Inflammatory arthritis   . Osteoarthritis    knees, spine  . Phlebitis    x2 (with pregnancy)  . 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 Pulmonary hypertension ([place])   . Reflux esophagitis   . Rheumatoid arthritis(714.0)    positive RF, FANA, RNP, negative CCP ab and anti DNA,.  neg anti-SCL 70  . 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 BREAST CYST ASPIRATION Left   . VEIN LIGATION AND STRIPPING     Family History  Problem Relation Age of Onset  . Arthritis Mother   . Heart attack Father   . Colon cancer Neg Hx    Social History   Social History  . Marital status: Married    Spouse name: N/A  . Number of children: N/A  . Years of  education: N/A   Social History Main Topics  . Smoking status: Former Smoker    Packs/day: 0.50    Years: 15.00    Quit date: 05/12/1988  . Smokeless tobacco: Never Used  . Alcohol use No  . Drug use: No  . Sexual activity: Yes   Other Topics Concern  . None   Social History Narrative  . None    Outpatient Encounter Prescriptions as of 11/03/2016  Medication Sig  . allopurinol (ZYLOPRIM) 300 MG tablet TAKE ONE TABLET BY MOUTH ONCE DAILY  . amLODipine (NORVASC) 10 MG tablet TAKE ONE TABLET BY MOUTH ONCE DAILY  . amoxicillin (AMOXIL) 500 MG capsule Take 1 capsule (500 mg total) by mouth 2 (two) times daily.  Marland Kitchen aspirin 81 MG tablet Take 81 mg by mouth daily. Reported on 07/24/2015  . fluticasone (FLONASE) 50 MCG/ACT nasal spray Place 2 sprays into the nose daily.  . folic acid (FOLVITE) 1 MG tablet Take 1 mg by mouth daily.  Marland Kitchen glucose blood test strip Contour test strips-use to test blood sugars twice a day. Dx E11.9  . lisinopril (PRINIVIL,ZESTRIL) 40 MG tablet TAKE ONE TABLET BY MOUTH ONCE DAILY  . metFORMIN (GLUCOPHAGE) 500 MG  tablet TAKE ONE TABLET BY MOUTH TWICE DAILY WITH MEALS  . methotrexate 2.5 MG tablet Take 6 tablets q week  . metoprolol succinate (TOPROL-XL) 50 MG 24 hr tablet TAKE ONE TABLET BY MOUTH ONCE DAILY IMMEDIATELY  FOLLOWING  A  MEAL  . MICROLET LANCETS MISC Check sugar once daily, [employer] Microlet Lancets. Dx E11.9  . mupirocin ointment (BACTROBAN) 2 % Place 1 application into the nose 2 (two) times daily.  . naproxen (NAPROSYN) 500 MG tablet Take 500 mg by mouth 2 (two) times daily as needed.  Marland Kitchen omeprazole (PRILOSEC) 20 MG capsule Take 20 mg by mouth daily.  Marland Kitchen spironolactone (ALDACTONE) 25 MG tablet TAKE ONE TABLET BY MOUTH ONCE DAILY  . traMADol (ULTRAM) 50 MG tablet Take 1 tablet (50 mg total) by mouth 2 (two) times daily as needed.  . [DISCONTINUED] lidocaine (XYLOCAINE) 2 % solution Use as directed 15 mLs in the mouth or throat every 3 (three) hours as  needed for mouth pain (gargle; may spit or swallow).   No facility-administered encounter medications on file as of 11/03/2016.     Review of Systems  Constitutional: Negative for appetite change and unexpected weight change.  HENT: Negative for congestion and sinus pressure.   Respiratory: Negative for cough, chest tightness and shortness of breath.   Cardiovascular: Positive for leg swelling. Negative for chest pain and palpitations.  Gastrointestinal: Negative for abdominal pain, diarrhea, nausea and vomiting.  Genitourinary: Negative for difficulty urinating and dysuria.  Musculoskeletal: Negative for myalgias.       Joints stable.  Sees Dr Jefm Bryant.    Skin: Negative for wound.       Redness over right lower leg.    Neurological: Negative for dizziness, light-headedness and headaches.  Psychiatric/Behavioral: Negative for agitation and dysphoric mood.       Objective:    Physical Exam  Constitutional: She appears well-developed and well-nourished. No distress.  HENT:  Nose: Nose normal.  Mouth/Throat: Oropharynx is clear and moist.  Neck: Neck supple. No thyromegaly present.  Cardiovascular: Normal rate and regular rhythm.   Pulmonary/Chest: Breath sounds normal. No respiratory distress. She has no wheezes.  Abdominal: Soft. Bowel sounds are normal. There is no tenderness.  Musculoskeletal: She exhibits no tenderness.  No increased edema.  Improved.  Increased erythema and tenderness - right lower extremity - localized area - mid lower leg.  No redness extends up the leg.  No calf tenderness.  Some increased tenderness directly over the red area.    Lymphadenopathy:    She has no cervical adenopathy.    BP 138/82 (BP Location: Left Arm, Patient Position: Sitting, Cuff Size: Large)   Pulse 79   Temp 98.6 F (37 C) (Oral)   Resp 12   Ht '5\' 6"'  (1.676 m)   Wt 232 lb (105.2 kg)   LMP 05/25/1988   SpO2 95%   BMI 37.45 kg/m  Wt Readings from Last 3 Encounters:  11/03/16  232 lb (105.2 kg)  07/23/16 235 lb 3.2 oz (106.7 kg)  06/25/16 237 lb 9.6 oz (107.8 kg)     Lab Results  Component Value Date   WBC 6.3 11/03/2016   HGB 14.5 11/03/2016   HCT 43.9 11/03/2016   PLT 210.0 11/03/2016   GLUCOSE 116 (H) 11/03/2016   CHOL 165 11/03/2016   TRIG 164.0 (H) 11/03/2016   HDL 35.40 (L) 11/03/2016   LDLCALC 97 11/03/2016   ALT 32 11/03/2016   AST 28 11/03/2016   NA 136 11/03/2016  K 5.0 11/03/2016   CL 101 11/03/2016   CREATININE 1.21 (H) 11/03/2016   BUN 20 11/03/2016   CO2 30 11/03/2016   TSH 3.65 06/25/2016   HGBA1C 6.5 11/03/2016   MICROALBUR 13.2 (H) 06/25/2016    Mm Screening Breast Tomo Bilateral  Result Date: 12/05/2015 CLINICAL DATA:  Screening. EXAM: 2D DIGITAL SCREENING BILATERAL MAMMOGRAM WITH CAD AND ADJUNCT TOMO COMPARISON:  Previous exam(s). ACR Breast Density Category b: There are scattered areas of fibroglandular density. FINDINGS: There are no findings suspicious for malignancy. Images were processed with CAD. IMPRESSION: No mammographic evidence of malignancy. A result letter of this screening mammogram will be mailed directly to the patient. RECOMMENDATION: Screening mammogram in one year. (Code:SM-B-01Y) BI-RADS CATEGORY  1: Negative. Electronically Signed   By: Franki Cabot M.D.   On: 12/05/2015 14:14      Assessment & Plan:   Problem List Items Addressed This Visit    Diabetes mellitus ([place])    Sugars as outlined.  Continue low carb diet and exercise.  Follow met b and a1c.  Up to date with eye checks.        Relevant Orders   Hemoglobin A1c (Completed)   Basic metabolic panel (Completed)   GERD (gastroesophageal reflux disease)    On prilosec.  Controlled.        Hypercholesterolemia    Low cholesterol diet and exercise.  Follow lipid panel.        Relevant Orders   Hepatic function panel (Completed)   Lipid panel (Completed)   Hypertension    Blood pressure under good control.  Continue same medication regimen.   Follow pressures.  Follow metabolic panel.        Obesity (BMI 30-39.9)    Diet and exercise.        Rheumatoid arthritis ([place])    Stable.  Followed by Dr Jefm Bryant.        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  With increased localized redness and tenderness.  Exam as outlined.  Concern regarding thrombophlebitis.  Takes aspirin daily.  Given change in exam and new redness and tenderness, will have vascular reevaluate.  She is in agreement.  They are going to see her tomorrow.    Addendum - pts labs returned with elevated creatinine.  Avoid antiinflammatories.         Other Visit Diagnoses    Foot lesion    -  Primary   states has "corn" on her foot.  persistent.  request referral back to Dr Elvina Mattes.     Relevant Orders   Ambulatory referral to Podiatry   Macrocytosis       Relevant Orders   CBC with Differential/Platelet (Completed)       Einar Pheasant, MD

## 2016-11-04 ENCOUNTER — Encounter: Payer: Self-pay | Admitting: Internal Medicine

## 2016-11-04 ENCOUNTER — Other Ambulatory Visit: Payer: Self-pay | Admitting: Internal Medicine

## 2016-11-04 DIAGNOSIS — E538 Deficiency of other specified B group vitamins: Secondary | ICD-10-CM

## 2016-11-04 DIAGNOSIS — R7989 Other specified abnormal findings of blood chemistry: Secondary | ICD-10-CM

## 2016-11-04 DIAGNOSIS — D7589 Other specified diseases of blood and blood-forming organs: Secondary | ICD-10-CM

## 2016-11-04 NOTE — Assessment & Plan Note (Signed)
On prilosec.  Controlled.   

## 2016-11-04 NOTE — Assessment & Plan Note (Signed)
Sugars as outlined.  Continue low carb diet and exercise.  Follow met b and a1c.  Up to date with eye checks.

## 2016-11-04 NOTE — Assessment & Plan Note (Signed)
Diet and exercise.   

## 2016-11-04 NOTE — Assessment & Plan Note (Signed)
Stable.  Followed by Dr Kernodle.   

## 2016-11-04 NOTE — Assessment & Plan Note (Addendum)
Seeing vascular surgery.  With increased localized redness and tenderness.  Exam as outlined.  Concern regarding thrombophlebitis.  Takes aspirin daily.  Given change in exam and new redness and tenderness, will have vascular reevaluate.  She is in agreement.  They are going to see her tomorrow.    Addendum - pts labs returned with elevated creatinine.  Avoid antiinflammatories.

## 2016-11-04 NOTE — Progress Notes (Signed)
Order placed for f/u labs.  

## 2016-11-04 NOTE — Assessment & Plan Note (Signed)
Blood pressure under good control.  Continue same medication regimen.  Follow pressures.  Follow metabolic panel.   

## 2016-11-04 NOTE — Assessment & Plan Note (Signed)
Low cholesterol diet and exercise.  Follow lipid panel.   

## 2016-11-10 ENCOUNTER — Other Ambulatory Visit (INDEPENDENT_AMBULATORY_CARE_PROVIDER_SITE_OTHER): Payer: Medicare Other

## 2016-11-10 DIAGNOSIS — E538 Deficiency of other specified B group vitamins: Secondary | ICD-10-CM | POA: Diagnosis not present

## 2016-11-10 DIAGNOSIS — R7989 Other specified abnormal findings of blood chemistry: Secondary | ICD-10-CM

## 2016-11-10 DIAGNOSIS — D7589 Other specified diseases of blood and blood-forming organs: Secondary | ICD-10-CM

## 2016-11-10 LAB — BASIC METABOLIC PANEL
BUN: 18 mg/dL (ref 6–23)
CO2: 25 mEq/L (ref 19–32)
Calcium: 10.5 mg/dL (ref 8.4–10.5)
Chloride: 103 mEq/L (ref 96–112)
Creatinine, Ser: 1.18 mg/dL (ref 0.40–1.20)
GFR: 47.4 mL/min — AB (ref >60.00)
GLUCOSE: 151 mg/dL — AB (ref 70–99)
POTASSIUM: 4.3 meq/L (ref 3.5–5.1)
SODIUM: 137 meq/L (ref 135–145)

## 2016-11-10 LAB — VITAMIN B12: Vitamin B-12: >1500 pg/mL — ABNORMAL HIGH (ref 211–911)

## 2016-11-11 LAB — FOLATE RBC: RBC Folate: 721 ng/mL (ref >280)

## 2016-11-19 ENCOUNTER — Other Ambulatory Visit: Payer: Self-pay | Admitting: Internal Medicine

## 2016-11-28 ENCOUNTER — Other Ambulatory Visit: Payer: Self-pay | Admitting: Internal Medicine

## 2016-12-08 ENCOUNTER — Ambulatory Visit (INDEPENDENT_AMBULATORY_CARE_PROVIDER_SITE_OTHER): Payer: Medicare Other | Admitting: Family

## 2016-12-08 ENCOUNTER — Encounter: Payer: Self-pay | Admitting: Family

## 2016-12-08 VITALS — BP 152/94 | HR 75 | Temp 98.3°F | Ht 66.0 in | Wt 236.6 lb

## 2016-12-08 DIAGNOSIS — J40 Bronchitis, not specified as acute or chronic: Secondary | ICD-10-CM

## 2016-12-08 DIAGNOSIS — H1031 Unspecified acute conjunctivitis, right eye: Secondary | ICD-10-CM | POA: Diagnosis not present

## 2016-12-08 MED ORDER — BENZONATATE 100 MG PO CAPS: 100.0000 mg | ORAL_CAPSULE | Freq: Two times a day (BID) | ORAL | 0 refills | Status: DC | PRN

## 2016-12-08 MED ORDER — DOXYCYCLINE HYCLATE 100 MG PO TABS: 100.0000 mg | ORAL_TABLET | Freq: Two times a day (BID) | ORAL | 0 refills | Status: DC

## 2016-12-08 MED ORDER — ERYTHROMYCIN 5 MG/GM OP OINT: TOPICAL_OINTMENT | OPHTHALMIC | 0 refills | Status: DC

## 2016-12-08 NOTE — Progress Notes (Signed)
Pre visit review using our clinic review tool, if applicable. No additional management support is needed unless otherwise documented below in the visit note. 

## 2016-12-08 NOTE — Patient Instructions (Signed)
Start eyedrops Tessalon Perles as needed Plenty of water Antibiotic  Probiotics as discussed  If there is no improvement in your symptoms, or if there is any worsening of symptoms, or if you have any additional concerns, please return for re-evaluation; or, if we are closed, consider going to the Emergency Room for evaluation if symptoms urgent.

## 2016-12-08 NOTE — Progress Notes (Signed)
Subjective:    Patient ID: Nina Perez, female    DOB: 1941/01/05, 76 y.o.   MRN: [id number]  CC: Nina Perez is a 76 y.o. female who presents today for an acute visit.    HPI: CC: productive cough, sinus congestion, sore throat x 3 weeks, worsening.   No wheezing, SOB, fever.  Right eye matted last week in the morning. NO eye pain, changes in vision, HA, photophobia. NO contact lenses.   Taking mucinex ( not sure which one) , tyleonol, motrin,   htn- compliant with medication, Denies exertional chest pain or pressure, numbness or tingling radiating to left arm or jaw, palpitations, dizziness, frequent headaches, changes in vision, or shortness of breath.   Former smoker.         HISTORY:  Past Medical History:  Diagnosis Date  . Barrett's esophagus   . Diabetes mellitus ([place])   . Gout   . History of colon cancer    adenomatous polyps  . Hypercholesterolemia   . Hypertension   . Inflammatory arthritis   . Osteoarthritis    knees, spine  . Phlebitis    x2 (with pregnancy)  . 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 Pulmonary hypertension ([place])   . Reflux esophagitis   . Rheumatoid arthritis(714.0)    positive RF, FANA, RNP, negative CCP ab and anti DNA,.  neg anti-SCL 70  . 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 BREAST CYST ASPIRATION Left   . VEIN LIGATION AND STRIPPING     Family History  Problem Relation Age of Onset  . Arthritis Mother   . Heart attack Father   . Colon cancer Neg Hx     Allergies: Maxzide [[id number]-hctz] Current Outpatient Prescriptions on File Prior to Visit  Medication Sig Dispense Refill  . allopurinol (ZYLOPRIM) 300 MG tablet TAKE ONE TABLET BY MOUTH ONCE DAILY 30 tablet 5  . amLODipine (NORVASC) 10 MG tablet TAKE ONE TABLET BY MOUTH ONCE DAILY 90 tablet 3  . amoxicillin (AMOXIL) 500 MG capsule Take 1 capsule (500 mg total) by mouth 2 (two) times  daily. 14 capsule 0  . aspirin 81 MG tablet Take 81 mg by mouth daily. Reported on 07/24/2015    . fluticasone (FLONASE) 50 MCG/ACT nasal spray Place 2 sprays into the nose daily. 16 g 1  . folic acid (FOLVITE) 1 MG tablet Take 1 mg by mouth daily.    Marland Kitchen glucose blood test strip Contour test strips-use to test blood sugars twice a day. Dx E11.9 100 each 12  . lisinopril (PRINIVIL,ZESTRIL) 40 MG tablet TAKE ONE TABLET BY MOUTH ONCE DAILY 90 tablet 2  . metFORMIN (GLUCOPHAGE) 500 MG tablet TAKE ONE TABLET BY MOUTH TWICE DAILY WITH MEALS 60 tablet 5  . methotrexate 2.5 MG tablet Take 6 tablets q week    . metoprolol succinate (TOPROL-XL) 50 MG 24 hr tablet TAKE ONE TABLET BY MOUTH ONCE DAILY IMMEDIATELY  FOLLOWING  A  MEAL 90 tablet 1  . MICROLET LANCETS MISC Check sugar once daily, [employer] Microlet Lancets. Dx E11.9 100 each 12  . mupirocin ointment (BACTROBAN) 2 % Place 1 application into the nose 2 (two) times daily. 22 g 0  . naproxen (NAPROSYN) 500 MG tablet Take 500 mg by mouth 2 (two) times daily as needed.    Marland Kitchen omeprazole (PRILOSEC) 20 MG capsule Take 20 mg by mouth daily.    Marland Kitchen  spironolactone (ALDACTONE) 25 MG tablet TAKE 1 TABLET BY MOUTH ONCE DAILY 30 tablet 0  . traMADol (ULTRAM) 50 MG tablet Take 1 tablet (50 mg total) by mouth 2 (two) times daily as needed. 30 tablet 0   No current facility-administered medications on file prior to visit.     Social History  Substance Use Topics  . Smoking status: Former Smoker    Packs/day: 0.50    Years: 15.00    Quit date: 05/12/1988  . Smokeless tobacco: Never Used  . Alcohol use No    Review of Systems  Constitutional: Negative for chills and fever.  HENT: Positive for congestion and sore throat. Negative for ear pain and sinus pressure.   Eyes: Positive for discharge (right) and itching. Negative for photophobia, pain and visual disturbance.  Respiratory: Positive for cough. Negative for shortness of breath and wheezing.     Cardiovascular: Negative for chest pain and palpitations.  Gastrointestinal: Negative for nausea and vomiting.      Objective:    BP (!) 152/94   Pulse 75   Temp 98.3 F (36.8 C) (Oral)   Ht 5\' 6"  (1.676 m)   Wt 236 lb 9.6 oz (107.3 kg)   LMP 05/25/1988   SpO2 93%   BMI 38.19 kg/m    Physical Exam  Constitutional: She appears well-developed and well-nourished.  HENT:  Head: Normocephalic and atraumatic.  Right Ear: Hearing, tympanic membrane, external ear and ear canal normal. No drainage, swelling or tenderness. No foreign bodies. Tympanic membrane is not erythematous and not bulging. No middle ear effusion. No decreased hearing is noted.  Left Ear: Hearing, tympanic membrane, external ear and ear canal normal. No drainage, swelling or tenderness. No foreign bodies. Tympanic membrane is not erythematous and not bulging.  No middle ear effusion. No decreased hearing is noted.  Nose: Nose normal. No rhinorrhea. Right sinus exhibits no maxillary sinus tenderness and no frontal sinus tenderness. Left sinus exhibits no maxillary sinus tenderness and no frontal sinus tenderness.  Mouth/Throat: Uvula is midline and mucous membranes are normal. Posterior oropharyngeal erythema present. No oropharyngeal exudate, posterior oropharyngeal edema or tonsillar abscesses.  Eyes: Pupils are equal, round, and reactive to light. EOM are normal. Lids are everted and swept, no foreign bodies found. Right eye exhibits no discharge and no hordeolum. No foreign body present in the right eye. Left eye exhibits no discharge and no hordeolum. No foreign body present in the left eye. Right conjunctiva is injected. Right conjunctiva has no hemorrhage. Left conjunctiva is not injected. Left conjunctiva has no hemorrhage. No scleral icterus.  No external eye lesions. Surrounding skin intact.   Right eye:   Scant injection of the conjunctiva. No white spots, opacity, or foreign body appreciated. No collection of  blood or pus in the anterior chamber. No ciliary flush surrounding iris.   No photophobia or eye pain appreciated during exam.   Cardiovascular: Regular rhythm, normal heart sounds and normal pulses.   Pulmonary/Chest: Effort normal and breath sounds normal. She has no wheezes. She has no rhonchi. She has no rales.  Lymphadenopathy:       Head (right side): No submental, no submandibular, no tonsillar, no preauricular, no posterior auricular and no occipital adenopathy present.       Head (left side): No submental, no submandibular, no tonsillar, no preauricular, no posterior auricular and no occipital adenopathy present.    She has no cervical adenopathy.  Neurological: She is alert.  Skin: Skin is warm and dry.  Psychiatric: She has a normal mood and affect. Her speech is normal and behavior is normal. Thought content normal.  Vitals reviewed.      Assessment & Plan:   1. Bronchitis Afebrile.  sa02 93%. Based on duration of symptoms, patient and I jointly agreed to treat antibiotic. Tessalon Perles as needed. Encouraged probiotics. Return precautions given. - benzonatate (TESSALON) 100 MG capsule; Take 1 capsule (100 mg total) by mouth 2 (two) times daily as needed for cough.  Dispense: 20 capsule; Refill: 0 - doxycycline (VIBRA-TABS) 100 MG tablet; Take 1 tablet (100 mg total) by mouth 2 (two) times daily.  Dispense: 10 tablet; Refill: 0  2. Acute bacterial conjunctivitis of right eye Symptoms most consistent with bacterial conjunctivitis. Return precautions given. - erythromycin ophthalmic ointment; Use one half inch four times daily to affected eye (s) x 7 days.  Dispense: 3.5 g; Refill: 0   Of note, patient and I discussed elevated blood pressure today. However we jointly agreed that she'll follow her blood pressure at home. If continues to be elevated, she'll contact her PCP. Advised her to ensure the Mucinex is the plain variety as opposed 'Mucinex D'.   I am having Ms. Colombe  maintain her omeprazole, aspirin, naproxen, folic acid, methotrexate, fluticasone, MICROLET LANCETS, glucose blood, metFORMIN, traMADol, mupirocin ointment, lisinopril, amLODipine, metoprolol succinate, amoxicillin, allopurinol, and spironolactone.   No orders of the defined types were placed in this encounter.   Return precautions given.   Risks, benefits, and alternatives of the medications and treatment plan prescribed today were discussed, and patient expressed understanding.   Education regarding symptom management and diagnosis given to patient on AVS.  Continue to follow with Einar Pheasant, MD for routine health maintenance.   [name] and I agreed with plan.   Mable [place], FNP

## 2016-12-24 ENCOUNTER — Other Ambulatory Visit: Payer: Self-pay | Admitting: Internal Medicine

## 2017-01-29 ENCOUNTER — Telehealth: Payer: Self-pay | Admitting: Radiology

## 2017-01-29 NOTE — Telephone Encounter (Signed)
Pt coming in for labs next Tuesday, please place future orders. Thank you.  

## 2017-01-30 ENCOUNTER — Other Ambulatory Visit: Payer: Self-pay | Admitting: Internal Medicine

## 2017-01-30 DIAGNOSIS — E119 Type 2 diabetes mellitus without complications: Secondary | ICD-10-CM

## 2017-01-30 DIAGNOSIS — I10 Essential (primary) hypertension: Secondary | ICD-10-CM

## 2017-01-30 DIAGNOSIS — E7800 Pure hypercholesterolemia, unspecified: Secondary | ICD-10-CM

## 2017-01-30 NOTE — Telephone Encounter (Signed)
Orders placed for f/u labs.  

## 2017-01-30 NOTE — Progress Notes (Signed)
Orders placed fo f/u labs.

## 2017-02-02 ENCOUNTER — Telehealth: Payer: Self-pay | Admitting: Internal Medicine

## 2017-02-02 DIAGNOSIS — R300 Dysuria: Secondary | ICD-10-CM

## 2017-02-02 NOTE — Telephone Encounter (Signed)
Patient aware and gave verbal understanding  

## 2017-02-02 NOTE — Telephone Encounter (Signed)
The patient called stated that she is having labs done in the morning wanting lab orders for a urine put in to test her for a UTI . She is having to go frequently with little urine produced itching and burning.

## 2017-02-02 NOTE — Telephone Encounter (Signed)
I have added urinalysis and culture to labs scheduled for tomorrow.  If worsening symptoms, will need to be evaluated.

## 2017-02-02 NOTE — Telephone Encounter (Signed)
Called patient states that for over a week. She has been having frequency with burning and itching. She states that she has not had any pain. No fever or abdominal pain. She has appointment on Friday to see you. Informed that would let you know and call her back with instructions.

## 2017-02-03 ENCOUNTER — Other Ambulatory Visit (INDEPENDENT_AMBULATORY_CARE_PROVIDER_SITE_OTHER): Payer: Medicare Other

## 2017-02-03 DIAGNOSIS — E119 Type 2 diabetes mellitus without complications: Secondary | ICD-10-CM | POA: Diagnosis not present

## 2017-02-03 DIAGNOSIS — E7800 Pure hypercholesterolemia, unspecified: Secondary | ICD-10-CM

## 2017-02-03 DIAGNOSIS — R300 Dysuria: Secondary | ICD-10-CM | POA: Diagnosis not present

## 2017-02-03 DIAGNOSIS — I10 Essential (primary) hypertension: Secondary | ICD-10-CM | POA: Diagnosis not present

## 2017-02-03 LAB — URINALYSIS, ROUTINE W REFLEX MICROSCOPIC
Bilirubin Urine: NEGATIVE
Ketones, ur: NEGATIVE
NITRITE: POSITIVE — AB
PH: 5.5 (ref 5.0–8.0)
SPECIFIC GRAVITY, URINE: 1.02 (ref 1.000–1.030)
Urine Glucose: NEGATIVE
Urobilinogen, UA: 0.2 (ref 0.0–1.0)

## 2017-02-03 LAB — BASIC METABOLIC PANEL
BUN: 18 mg/dL (ref 6–23)
CHLORIDE: 106 meq/L (ref 96–112)
CO2: 29 meq/L (ref 19–32)
CREATININE: 1.03 mg/dL (ref 0.40–1.20)
Calcium: 10.2 mg/dL (ref 8.4–10.5)
GFR: 55.42 mL/min — AB (ref >60.00)
GLUCOSE: 112 mg/dL — AB (ref 70–99)
POTASSIUM: 4.7 meq/L (ref 3.5–5.1)
Sodium: 140 mEq/L (ref 135–145)

## 2017-02-03 LAB — CBC WITH DIFFERENTIAL/PLATELET
BASOS PCT: 1.3 % (ref 0.0–3.0)
Basophils Absolute: 0.1 10*3/uL (ref 0.0–0.1)
EOS ABS: 0.1 10*3/uL (ref 0.0–0.7)
Eosinophils Relative: 2.1 % (ref 0.0–5.0)
HEMATOCRIT: 42.3 % (ref 36.0–46.0)
Hemoglobin: 13.5 g/dL (ref 12.0–15.0)
LYMPHS ABS: 0.9 10*3/uL (ref 0.7–4.0)
LYMPHS PCT: 17.2 % (ref 12.0–46.0)
MCHC: 31.9 g/dL (ref 30.0–36.0)
MCV: 103.5 fl — AB (ref 78.0–100.0)
Monocytes Absolute: 0.4 10*3/uL (ref 0.1–1.0)
Monocytes Relative: 7.9 % (ref 3.0–12.0)
NEUTROS ABS: 3.7 10*3/uL (ref 1.4–7.7)
NEUTROS PCT: 71.5 % (ref 43.0–77.0)
PLATELETS: 216 10*3/uL (ref 150.0–400.0)
RBC: 4.09 Mil/uL (ref 3.87–5.11)
RDW: 17.8 % — AB (ref 11.5–15.5)
WBC: 5.2 10*3/uL (ref 4.0–10.5)

## 2017-02-03 LAB — HEPATIC FUNCTION PANEL
ALBUMIN: 3.8 g/dL (ref 3.5–5.2)
ALK PHOS: 70 U/L (ref 39–117)
ALT: 16 U/L (ref 0–35)
AST: 19 U/L (ref 0–37)
Bilirubin, Direct: 0.1 mg/dL (ref 0.0–0.3)
TOTAL PROTEIN: 6.8 g/dL (ref 6.0–8.3)
Total Bilirubin: 0.6 mg/dL (ref 0.2–1.2)

## 2017-02-03 LAB — LIPID PANEL
CHOLESTEROL: 160 mg/dL (ref 0–200)
HDL: 31.6 mg/dL — AB (ref >39.00)
LDL Cholesterol: 97 mg/dL (ref 0–99)
NonHDL: 127.92
TRIGLYCERIDES: 155 mg/dL — AB (ref 0.0–149.0)
Total CHOL/HDL Ratio: 5
VLDL: 31 mg/dL (ref 0.0–40.0)

## 2017-02-03 LAB — HEMOGLOBIN A1C: Hgb A1c MFr Bld: 6.3 % (ref 4.6–6.5)

## 2017-02-04 ENCOUNTER — Other Ambulatory Visit: Payer: Self-pay | Admitting: Internal Medicine

## 2017-02-05 LAB — URINE CULTURE
MICRO NUMBER: 81060352
SPECIMEN QUALITY: ADEQUATE

## 2017-02-06 ENCOUNTER — Encounter: Payer: Self-pay | Admitting: Internal Medicine

## 2017-02-06 ENCOUNTER — Ambulatory Visit (INDEPENDENT_AMBULATORY_CARE_PROVIDER_SITE_OTHER): Payer: Medicare Other | Admitting: Internal Medicine

## 2017-02-06 VITALS — BP 136/82 | HR 71 | Temp 98.8°F | Resp 14 | Ht 66.0 in | Wt 232.0 lb

## 2017-02-06 DIAGNOSIS — N3000 Acute cystitis without hematuria: Secondary | ICD-10-CM

## 2017-02-06 DIAGNOSIS — K2100 Gastro-esophageal reflux disease with esophagitis, without bleeding: Secondary | ICD-10-CM

## 2017-02-06 DIAGNOSIS — M069 Rheumatoid arthritis, unspecified: Secondary | ICD-10-CM

## 2017-02-06 DIAGNOSIS — I10 Essential (primary) hypertension: Secondary | ICD-10-CM | POA: Diagnosis not present

## 2017-02-06 DIAGNOSIS — E7800 Pure hypercholesterolemia, unspecified: Secondary | ICD-10-CM

## 2017-02-06 DIAGNOSIS — I872 Venous insufficiency (chronic) (peripheral): Secondary | ICD-10-CM

## 2017-02-06 DIAGNOSIS — E119 Type 2 diabetes mellitus without complications: Secondary | ICD-10-CM | POA: Diagnosis not present

## 2017-02-06 DIAGNOSIS — E669 Obesity, unspecified: Secondary | ICD-10-CM

## 2017-02-06 MED ORDER — AMOXICILLIN-POT CLAVULANATE 875-125 MG PO TABS: 1.0000 | ORAL_TABLET | Freq: Two times a day (BID) | ORAL | 0 refills | Status: DC

## 2017-02-06 NOTE — Patient Instructions (Signed)
Take a probiotic daily while you are on the antibiotics and for two weeks after completing the antibiotics.    Ex:  align

## 2017-02-06 NOTE — Progress Notes (Signed)
Patient ID: Nina Perez, female   DOB: 16-Oct-1940, 76 y.o.   MRN: [id number]   Subjective:    Patient ID: Nina Perez, female    DOB: [birth year], 76 y.o.   MRN: [id number]  HPI  Patient here for a scheduled follow up.  She reports having dysuria and urgency.  Left urine sample when came in for labs.  Culture reveals infection.  Discussed abx.  Stays active.  No chest pain.  No sob.  No acid reflux.  No abdominal pain.  Bowels moving.  Discussed lab results.  a1c 6.3.     Past Medical History:  Diagnosis Date  . Barrett's esophagus   . Diabetes mellitus ([place])   . Gout   . History of colon cancer    adenomatous polyps  . Hypercholesterolemia   . Hypertension   . Inflammatory arthritis   . Osteoarthritis    knees, spine  . Phlebitis    x2 (with pregnancy)  . 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 Pulmonary hypertension ([place])   . Reflux esophagitis   . Rheumatoid arthritis(714.0)    positive RF, FANA, RNP, negative CCP ab and anti DNA,.  neg anti-SCL 70  . 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 BREAST CYST ASPIRATION Left   . VEIN LIGATION AND STRIPPING     Family History  Problem Relation Age of Onset  . Arthritis Mother   . Heart attack Father   . Colon cancer Neg Hx    Social History   Social History  . Marital status: Married    Spouse name: N/A  . Number of children: N/A  . Years of education: N/A   Social History Main Topics  . Smoking status: Former Smoker    Packs/day: 0.50    Years: 15.00    Quit date: 05/12/1988  . Smokeless tobacco: Never Used  . Alcohol use No  . Drug use: No  . Sexual activity: Yes   Other Topics Concern  . None   Social History Narrative  . None    Outpatient Encounter Prescriptions as of 02/06/2017  Medication Sig  . allopurinol (ZYLOPRIM) 300 MG tablet TAKE ONE TABLET BY MOUTH ONCE DAILY  . amLODipine (NORVASC) 10 MG tablet TAKE ONE TABLET BY  MOUTH ONCE DAILY  . aspirin 81 MG tablet Take 81 mg by mouth daily. Reported on 07/24/2015  . erythromycin ophthalmic ointment Use one half inch four times daily to affected eye (s) x 7 days.  . fluticasone (FLONASE) 50 MCG/ACT nasal spray Place 2 sprays into the nose daily.  . folic acid (FOLVITE) 1 MG tablet Take 1 mg by mouth daily.  Marland Kitchen glucose blood test strip Contour test strips-use to test blood sugars twice a day. Dx E11.9  . lisinopril (PRINIVIL,ZESTRIL) 40 MG tablet TAKE ONE TABLET BY MOUTH ONCE DAILY  . metFORMIN (GLUCOPHAGE) 500 MG tablet TAKE ONE TABLET BY MOUTH TWICE DAILY WITH MEALS  . methotrexate 2.5 MG tablet Take 6 tablets q week  . metoprolol succinate (TOPROL-XL) 50 MG 24 hr tablet TAKE ONE TABLET BY MOUTH ONCE DAILY IMMEDIATELY  FOLLOWING  A  MEAL  . MICROLET LANCETS MISC Check sugar once daily, [employer] Microlet Lancets. Dx E11.9  . mupirocin ointment (BACTROBAN) 2 % Place 1 application into the nose 2 (two) times daily.  . naproxen (NAPROSYN) 500 MG tablet Take 500 mg by mouth 2 (two) times daily as  needed.  Marland Kitchen omeprazole (PRILOSEC) 20 MG capsule Take 20 mg by mouth daily.  Marland Kitchen spironolactone (ALDACTONE) 25 MG tablet TAKE 1 TABLET BY MOUTH ONCE DAILY  . traMADol (ULTRAM) 50 MG tablet Take 1 tablet (50 mg total) by mouth 2 (two) times daily as needed.  . [DISCONTINUED] amoxicillin (AMOXIL) 500 MG capsule Take 1 capsule (500 mg total) by mouth 2 (two) times daily.  . [DISCONTINUED] benzonatate (TESSALON) 100 MG capsule Take 1 capsule (100 mg total) by mouth 2 (two) times daily as needed for cough.  . [DISCONTINUED] doxycycline (VIBRA-TABS) 100 MG tablet Take 1 tablet (100 mg total) by mouth 2 (two) times daily.  Marland Kitchen amoxicillin-clavulanate (AUGMENTIN) 875-125 MG tablet Take 1 tablet by mouth 2 (two) times daily.   No facility-administered encounter medications on file as of 02/06/2017.     Review of Systems  Constitutional: Negative for fatigue and unexpected weight change.    HENT: Negative for congestion and sinus pressure.   Respiratory: Negative for cough, chest tightness and shortness of breath.   Cardiovascular: Negative for chest pain, palpitations and leg swelling.  Gastrointestinal: Negative for abdominal pain, diarrhea, nausea and vomiting.  Genitourinary: Positive for urgency. Negative for difficulty urinating and dysuria.  Musculoskeletal: Negative for back pain and myalgias.  Skin: Negative for color change and rash.  Neurological: Negative for dizziness, light-headedness and headaches.  Psychiatric/Behavioral: Negative for agitation and dysphoric mood.       Objective:     Blood pressure rechecked by me:  122/78  Physical Exam  Constitutional: She appears well-developed and well-nourished. No distress.  HENT:  Nose: Nose normal.  Mouth/Throat: Oropharynx is clear and moist.  Neck: Neck supple. No thyromegaly present.  Cardiovascular: Normal rate and regular rhythm.   1/6 systolic murmur.    Pulmonary/Chest: Breath sounds normal. No respiratory distress. She has no wheezes.  Abdominal: Soft. Bowel sounds are normal. There is no tenderness.  Musculoskeletal: She exhibits no edema or tenderness.  Feet:  No lesions.  Sensation intact to pin prick and light touch.    Lymphadenopathy:    She has no cervical adenopathy.  Skin: No rash noted. No erythema.  Psychiatric: She has a normal mood and affect. Her behavior is normal.    BP 136/82 (BP Location: Left Arm, Patient Position: Sitting, Cuff Size: Large)   Pulse 71   Temp 98.8 F (37.1 C) (Oral)   Resp 14   Ht '5\' 6"'  (1.676 m)   Wt 232 lb (105.2 kg)   LMP 05/25/1988   SpO2 97%   BMI 37.45 kg/m  Wt Readings from Last 3 Encounters:  02/06/17 232 lb (105.2 kg)  12/08/16 236 lb 9.6 oz (107.3 kg)  11/03/16 232 lb (105.2 kg)     Lab Results  Component Value Date   WBC 5.2 02/03/2017   HGB 13.5 02/03/2017   HCT 42.3 02/03/2017   PLT 216.0 02/03/2017   GLUCOSE 112 (H) 02/03/2017    CHOL 160 02/03/2017   TRIG 155.0 (H) 02/03/2017   HDL 31.60 (L) 02/03/2017   LDLCALC 97 02/03/2017   ALT 16 02/03/2017   AST 19 02/03/2017   NA 140 02/03/2017   K 4.7 02/03/2017   CL 106 02/03/2017   CREATININE 1.03 02/03/2017   BUN 18 02/03/2017   CO2 29 02/03/2017   TSH 3.65 06/25/2016   HGBA1C 6.3 02/03/2017   MICROALBUR 13.2 (H) 06/25/2016    Mm Screening Breast Tomo Bilateral  Result Date: 12/05/2015 CLINICAL DATA:  Screening. EXAM:  2D DIGITAL SCREENING BILATERAL MAMMOGRAM WITH CAD AND ADJUNCT TOMO COMPARISON:  Previous exam(s). ACR Breast Density Category b: There are scattered areas of fibroglandular density. FINDINGS: There are no findings suspicious for malignancy. Images were processed with CAD. IMPRESSION: No mammographic evidence of malignancy. A result letter of this screening mammogram will be mailed directly to the patient. RECOMMENDATION: Screening mammogram in one year. (Code:SM-B-01Y) BI-RADS CATEGORY  1: Negative. Electronically Signed   By: Franki Cabot M.D.   On: 12/05/2015 14:14      Assessment & Plan:   Problem List Items Addressed This Visit    Diabetes mellitus ([place])    Low carb diet and exercise.  Follow met b and a1c.        Relevant Orders   Hemoglobin D2R   Basic metabolic panel   GERD (gastroesophageal reflux disease)    Controlled on prilosec.        Hypercholesterolemia    Low cholesterol diet and exercise.  Follow lipid panel.  LDL just checked 97.        Relevant Orders   Hepatic function panel   Lipid panel   Hypertension    Blood pressure under good control.  Continue same medication regimen.  Follow pressures.  Follow metabolic panel.        Relevant Orders   CBC with Differential/Platelet   Obesity (BMI 30-39.9)    Diet and exercise.  Follow.        Rheumatoid arthritis ([place])    Followed by Dr Jefm Bryant.  On MTX.  Stable.       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  Compression  hose.         Other Visit Diagnoses    Acute cystitis without hematuria    -  Primary   Urine culture positive for bactaerial infection. treat with augmentin as directed.  follow.         Einar Pheasant, MD

## 2017-02-08 ENCOUNTER — Encounter: Payer: Self-pay | Admitting: Internal Medicine

## 2017-02-08 NOTE — Assessment & Plan Note (Signed)
Seeing vascular surgery.  Compression hose.

## 2017-02-08 NOTE — Assessment & Plan Note (Signed)
Controlled on prilosec.   

## 2017-02-08 NOTE — Assessment & Plan Note (Signed)
Followed by Dr Kernodle.  On MTX.  Stable.  

## 2017-02-08 NOTE — Assessment & Plan Note (Signed)
Blood pressure under good control.  Continue same medication regimen.  Follow pressures.  Follow metabolic panel.   

## 2017-02-08 NOTE — Assessment & Plan Note (Signed)
Low cholesterol diet and exercise.  Follow lipid panel.  LDL just checked 97.

## 2017-02-08 NOTE — Assessment & Plan Note (Signed)
Diet and exercise.  Follow.  

## 2017-02-08 NOTE — Assessment & Plan Note (Signed)
Low carb diet and exercise.  Follow met b and a1c.   

## 2017-02-09 ENCOUNTER — Other Ambulatory Visit: Payer: Self-pay | Admitting: Internal Medicine

## 2017-02-09 DIAGNOSIS — Z1231 Encounter for screening mammogram for malignant neoplasm of breast: Secondary | ICD-10-CM

## 2017-02-18 ENCOUNTER — Other Ambulatory Visit: Payer: Self-pay | Admitting: Internal Medicine

## 2017-03-02 ENCOUNTER — Ambulatory Visit
Admission: RE | Admit: 2017-03-02 | Discharge: 2017-03-02 | Disposition: A | Discharge status: Home / Self Care | Payer: Medicare Other | Source: Ambulatory Visit | Attending: Internal Medicine | Admitting: Internal Medicine

## 2017-03-02 DIAGNOSIS — Z1231 Encounter for screening mammogram for malignant neoplasm of breast: Secondary | ICD-10-CM | POA: Diagnosis not present

## 2017-03-10 ENCOUNTER — Other Ambulatory Visit: Payer: Self-pay | Admitting: Internal Medicine

## 2017-03-20 ENCOUNTER — Ambulatory Visit: Payer: Medicare Other

## 2017-03-23 ENCOUNTER — Other Ambulatory Visit: Payer: Self-pay | Admitting: Internal Medicine

## 2017-03-26 ENCOUNTER — Other Ambulatory Visit: Payer: Self-pay

## 2017-03-26 ENCOUNTER — Telehealth: Payer: Self-pay

## 2017-03-26 MED ORDER — METFORMIN HCL 500 MG PO TABS: 500.0000 mg | ORAL_TABLET | Freq: Every day | ORAL | 0 refills | Status: DC

## 2017-03-26 NOTE — Telephone Encounter (Signed)
Ok to change metformin to q day.  Will need new rx sent in.

## 2017-03-26 NOTE — Telephone Encounter (Signed)
New rx sent in for metformin q day

## 2017-03-26 NOTE — Telephone Encounter (Signed)
Received fax for medicine clarification for metformin pt reported taking once daily. Prescription is for twice daily pt verified taking once daily. Pt states taking twice daily makes her "shaky" and you recommended reducing it. Ok to change?

## 2017-04-15 ENCOUNTER — Other Ambulatory Visit: Payer: Self-pay | Admitting: Internal Medicine

## 2017-04-15 ENCOUNTER — Telehealth: Payer: Self-pay | Admitting: Internal Medicine

## 2017-04-15 MED ORDER — SPIRONOLACTONE 25 MG PO TABS
25.0000 mg | ORAL_TABLET | Freq: Every day | ORAL | 1 refills | Status: DC
Start: 2017-04-15 — End: 2017-11-10

## 2017-04-15 NOTE — Telephone Encounter (Signed)
Patient is aware,

## 2017-04-15 NOTE — Telephone Encounter (Signed)
rx sent in for 90 day supply.

## 2017-04-15 NOTE — Telephone Encounter (Signed)
Ok to send in 90 day supply 

## 2017-04-15 NOTE — Telephone Encounter (Signed)
Copied from [place] [id number]. Topic: Quick Communication - See Telephone Encounter >> Apr 15, 2017  9:34 AM Cleaster Corin, NT wrote: CRM for notification. See Telephone encounter for:   04/15/17.  Pt called to see if Dr. Nicki Reaper could give her a 90 day supply on med. Spironolactone (aldactone)25mg . Instead of pt. Going to pharmacy once a month. Pt has already called for refill for this month. Just a question for further reference   [place], [place] [address] [place] [address] Phone: [phone] Fax: [phone] Not a 24 hour pharmacy; exact hours not known

## 2017-04-27 ENCOUNTER — Other Ambulatory Visit: Payer: Self-pay | Admitting: Internal Medicine

## 2017-05-19 ENCOUNTER — Other Ambulatory Visit: Payer: Self-pay | Admitting: Internal Medicine

## 2017-05-25 ENCOUNTER — Other Ambulatory Visit: Payer: Self-pay | Admitting: Internal Medicine

## 2017-05-28 ENCOUNTER — Other Ambulatory Visit: Payer: Self-pay | Admitting: *Deleted

## 2017-05-28 MED ORDER — GLUCOSE BLOOD VI STRP: ORAL_STRIP | 12 refills | Status: DC

## 2017-05-29 ENCOUNTER — Other Ambulatory Visit: Payer: Self-pay | Admitting: Internal Medicine

## 2017-05-29 MED ORDER — GLUCOSE BLOOD VI STRP: ORAL_STRIP | 12 refills | Status: AC

## 2017-05-29 NOTE — Telephone Encounter (Signed)
Copied from [place] [phone]. Topic: Quick Communication - Rx Refill/Question >> May 29, 2017  9:24 AM Waylan Rocher, Lumin L wrote: Medication: glucose blood (BAYER CONTOUR TEST) test strip  Has the patient contacted their pharmacy? Yes.   (Agent: If no, request that the patient contact the pharmacy for the refill.) Preferred Pharmacy (with phone number or street name): [facility], [place] Agent: Please be advised that RX refills may take up to 3 business days. We ask that you follow-up with your pharmacy. Needs diagnosis code for medicare to cover the medication.

## 2017-05-29 NOTE — Telephone Encounter (Signed)
Medication has been refilled.

## 2017-06-09 ENCOUNTER — Other Ambulatory Visit (INDEPENDENT_AMBULATORY_CARE_PROVIDER_SITE_OTHER): Payer: Medicare Other

## 2017-06-09 DIAGNOSIS — E7800 Pure hypercholesterolemia, unspecified: Secondary | ICD-10-CM

## 2017-06-09 DIAGNOSIS — I10 Essential (primary) hypertension: Secondary | ICD-10-CM | POA: Diagnosis not present

## 2017-06-09 DIAGNOSIS — E119 Type 2 diabetes mellitus without complications: Secondary | ICD-10-CM | POA: Diagnosis not present

## 2017-06-09 LAB — HEPATIC FUNCTION PANEL
ALBUMIN: 3.7 g/dL (ref 3.5–5.2)
ALK PHOS: 72 U/L (ref 39–117)
ALT: 29 U/L (ref 0–35)
AST: 25 U/L (ref 0–37)
BILIRUBIN DIRECT: 0.1 mg/dL (ref 0.0–0.3)
TOTAL PROTEIN: 7.1 g/dL (ref 6.0–8.3)
Total Bilirubin: 0.6 mg/dL (ref 0.2–1.2)

## 2017-06-09 LAB — LIPID PANEL
CHOLESTEROL: 143 mg/dL (ref 0–200)
HDL: 29.6 mg/dL — ABNORMAL LOW (ref >39.00)
LDL CALC: 84 mg/dL (ref 0–99)
NONHDL: 113.64
Total CHOL/HDL Ratio: 5
Triglycerides: 150 mg/dL — ABNORMAL HIGH (ref 0.0–149.0)
VLDL: 30 mg/dL (ref 0.0–40.0)

## 2017-06-09 LAB — BASIC METABOLIC PANEL
BUN: 22 mg/dL (ref 6–23)
CALCIUM: 9.7 mg/dL (ref 8.4–10.5)
CO2: 29 mEq/L (ref 19–32)
Chloride: 104 mEq/L (ref 96–112)
Creatinine, Ser: 1.08 mg/dL (ref 0.40–1.20)
GFR: 52.42 mL/min — ABNORMAL LOW (ref >60.00)
GLUCOSE: 118 mg/dL — AB (ref 70–99)
Potassium: 5 mEq/L (ref 3.5–5.1)
SODIUM: 138 meq/L (ref 135–145)

## 2017-06-09 LAB — CBC WITH DIFFERENTIAL/PLATELET
BASOS PCT: 1.4 % (ref 0.0–3.0)
Basophils Absolute: 0.1 10*3/uL (ref 0.0–0.1)
EOS PCT: 1.6 % (ref 0.0–5.0)
Eosinophils Absolute: 0.1 10*3/uL (ref 0.0–0.7)
HCT: 41.6 % (ref 36.0–46.0)
Hemoglobin: 13.5 g/dL (ref 12.0–15.0)
LYMPHS ABS: 0.9 10*3/uL (ref 0.7–4.0)
Lymphocytes Relative: 17 % (ref 12.0–46.0)
MCHC: 32.5 g/dL (ref 30.0–36.0)
MCV: 101.3 fl — AB (ref 78.0–100.0)
MONO ABS: 0.5 10*3/uL (ref 0.1–1.0)
MONOS PCT: 9.2 % (ref 3.0–12.0)
NEUTROS PCT: 70.8 % (ref 43.0–77.0)
Neutro Abs: 3.9 10*3/uL (ref 1.4–7.7)
Platelets: 214 10*3/uL (ref 150.0–400.0)
RBC: 4.11 Mil/uL (ref 3.87–5.11)
RDW: 17.5 % — AB (ref 11.5–15.5)
WBC: 5.5 10*3/uL (ref 4.0–10.5)

## 2017-06-09 LAB — HEMOGLOBIN A1C: Hgb A1c MFr Bld: 6.4 % (ref 4.6–6.5)

## 2017-06-10 ENCOUNTER — Encounter: Payer: Self-pay | Admitting: Internal Medicine

## 2017-06-12 ENCOUNTER — Ambulatory Visit (INDEPENDENT_AMBULATORY_CARE_PROVIDER_SITE_OTHER): Payer: Medicare Other | Admitting: Internal Medicine

## 2017-06-12 ENCOUNTER — Encounter: Payer: Self-pay | Admitting: Internal Medicine

## 2017-06-12 VITALS — BP 140/82 | HR 79 | Temp 98.2°F | Resp 16 | Ht 70.0 in | Wt 233.9 lb

## 2017-06-12 DIAGNOSIS — E119 Type 2 diabetes mellitus without complications: Secondary | ICD-10-CM | POA: Diagnosis not present

## 2017-06-12 DIAGNOSIS — Z0000 Encounter for general adult medical examination without abnormal findings: Secondary | ICD-10-CM

## 2017-06-12 DIAGNOSIS — Z23 Encounter for immunization: Secondary | ICD-10-CM

## 2017-06-12 DIAGNOSIS — M1A9XX0 Chronic gout, unspecified, without tophus (tophi): Secondary | ICD-10-CM | POA: Diagnosis not present

## 2017-06-12 DIAGNOSIS — K2100 Gastro-esophageal reflux disease with esophagitis, without bleeding: Secondary | ICD-10-CM

## 2017-06-12 DIAGNOSIS — I10 Essential (primary) hypertension: Secondary | ICD-10-CM | POA: Diagnosis not present

## 2017-06-12 DIAGNOSIS — E669 Obesity, unspecified: Secondary | ICD-10-CM

## 2017-06-12 DIAGNOSIS — Z860100 Personal history of colon polyps, unspecified: Secondary | ICD-10-CM

## 2017-06-12 DIAGNOSIS — E7800 Pure hypercholesterolemia, unspecified: Secondary | ICD-10-CM

## 2017-06-12 DIAGNOSIS — M069 Rheumatoid arthritis, unspecified: Secondary | ICD-10-CM

## 2017-06-12 MED ORDER — LOSARTAN POTASSIUM 100 MG PO TABS: 100.0000 mg | ORAL_TABLET | Freq: Every day | ORAL | 3 refills | Status: DC

## 2017-06-12 NOTE — Patient Instructions (Signed)
Stop lisinopril.  Start losartan 100mg per day 

## 2017-06-12 NOTE — Assessment & Plan Note (Addendum)
Physical today 06/12/17.  Colonoscopy 05/14/12.  Mammogram 03/02/17 - [facility] I.

## 2017-06-12 NOTE — Progress Notes (Signed)
Patient ID: Nina Perez, female   DOB: 1940-12-11, 77 y.o.   MRN: [id number]   Subjective:    Patient ID: Nina Perez, female    DOB: May 05, 1941, 77 y.o.   MRN: [id number]  HPI  Patient with past history of RA, hypercholesterolemia, hypertension and diabetes.  She comes in today to follow up on these issues as well as for a complete physical exam.  She reports she is doing relatively well.  AM sugars averaging 115.  PM sugars 130.  Trying to watch her diet.  We discussed diet and exercise.  No chest pain.  No sob. No acid reflux.  No abdominal pain.  Bowels moving.  Does report a dry cough.  No congestion.  On ACE ihibitor.     Past Medical History:  Diagnosis Date  . Barrett's esophagus   . Diabetes mellitus ([place])   . Gout   . History of colon cancer    adenomatous polyps  . Hypercholesterolemia   . Hypertension   . Inflammatory arthritis   . Osteoarthritis    knees, spine  . Phlebitis    x2 (with pregnancy)  . 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 Pulmonary hypertension ([place])   . Reflux esophagitis   . Rheumatoid arthritis(714.0)    positive RF, FANA, RNP, negative CCP ab and anti DNA,.  neg anti-SCL 70  . 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 BREAST CYST ASPIRATION Left   . VEIN LIGATION AND STRIPPING     Family History  Problem Relation Age of Onset  . Arthritis Mother   . Heart attack Father   . Colon cancer Neg Hx   . Breast cancer Neg Hx    Social History   Socioeconomic History  . Marital status: Married    Spouse name: None  . Number of children: None  . Years of education: None  . Highest education level: None  Social Needs  . Financial resource strain: None  . Food insecurity - worry: None  . Food insecurity - inability: None  . Transportation needs - medical: None  . Transportation needs - non-medical: None  Occupational History  . None  Tobacco Use  . Smoking  status: Former Smoker    Packs/day: 0.50    Years: 15.00    Pack years: 7.50    Last attempt to quit: 05/12/1988    Years since quitting: 29.1  . Smokeless tobacco: Never Used  Substance and Sexual Activity  . Alcohol use: No    Alcohol/week: 0.0 oz  . Drug use: No  . Sexual activity: Yes  Other Topics Concern  . None  Social History Narrative  . None    Outpatient Encounter Medications as of 06/12/2017  Medication Sig  . allopurinol (ZYLOPRIM) 300 MG tablet TAKE 1 TABLET BY MOUTH ONCE DAILY  . amLODipine (NORVASC) 10 MG tablet TAKE ONE TABLET BY MOUTH ONCE DAILY  . aspirin 81 MG tablet Take 81 mg by mouth daily. Reported on 07/24/2015  . fluticasone (FLONASE) 50 MCG/ACT nasal spray Place 2 sprays into the nose daily.  . folic acid (FOLVITE) 1 MG tablet Take 1 mg by mouth daily.  Marland Kitchen glucose blood (BAYER CONTOUR TEST) test strip USE  STRIP TO CHECK GLUCOSE TWICE DAILY. Dx: E11.9  . metFORMIN (GLUCOPHAGE) 500 MG tablet Take 1 tablet (500 mg total) daily by mouth.  . methotrexate 2.5 MG tablet Take 6  tablets q week  . metoprolol succinate (TOPROL-XL) 50 MG 24 hr tablet TAKE 1 TABLET BY MOUTH ONCE DAILY IMMEDIATELY  FOLLOWING  A  MEAL  . MICROLET LANCETS MISC Check sugar once daily, [employer] Microlet Lancets. Dx E11.9  . naproxen (NAPROSYN) 500 MG tablet Take 500 mg by mouth 2 (two) times daily as needed.  Marland Kitchen omeprazole (PRILOSEC) 20 MG capsule Take 20 mg by mouth daily.  Marland Kitchen spironolactone (ALDACTONE) 25 MG tablet Take 1 tablet (25 mg total) by mouth daily.  . [DISCONTINUED] lisinopril (PRINIVIL,ZESTRIL) 40 MG tablet TAKE ONE TABLET BY MOUTH ONCE DAILY  . losartan (COZAAR) 100 MG tablet Take 1 tablet (100 mg total) by mouth daily.  . [DISCONTINUED] amoxicillin-clavulanate (AUGMENTIN) 875-125 MG tablet Take 1 tablet by mouth 2 (two) times daily.  . [DISCONTINUED] erythromycin ophthalmic ointment Use one half inch four times daily to affected eye (s) x 7 days.  . [DISCONTINUED] mupirocin  ointment (BACTROBAN) 2 % Place 1 application into the nose 2 (two) times daily.  . [DISCONTINUED] traMADol (ULTRAM) 50 MG tablet Take 1 tablet (50 mg total) by mouth 2 (two) times daily as needed.   No facility-administered encounter medications on file as of 06/12/2017.     Review of Systems  Constitutional: Negative for appetite change and unexpected weight change.  HENT: Negative for congestion and sinus pressure.   Eyes: Negative for pain and visual disturbance.  Respiratory: Positive for cough. Negative for chest tightness and shortness of breath.   Cardiovascular: Negative for chest pain, palpitations and leg swelling.  Gastrointestinal: Negative for abdominal pain, diarrhea and nausea.  Genitourinary: Negative for difficulty urinating and dysuria.  Musculoskeletal: Negative for joint swelling and myalgias.  Skin: Negative for color change and rash.  Neurological: Negative for dizziness, light-headedness and headaches.  Hematological: Negative for adenopathy. Does not bruise/bleed easily.  Psychiatric/Behavioral: Negative for agitation and dysphoric mood.       Objective:    Physical Exam  Constitutional: She is oriented to person, place, and time. She appears well-developed and well-nourished. No distress.  HENT:  Nose: Nose normal.  Mouth/Throat: Oropharynx is clear and moist.  Eyes: Right eye exhibits no discharge. Left eye exhibits no discharge. No scleral icterus.  Neck: Neck supple. No thyromegaly present.  Cardiovascular: Normal rate and regular rhythm.  Pulmonary/Chest: Breath sounds normal. No accessory muscle usage. No tachypnea. No respiratory distress. She has no decreased breath sounds. She has no wheezes. She has no rhonchi. Right breast exhibits no inverted nipple, no mass, no nipple discharge and no tenderness (no axillary adenopathy). Left breast exhibits no inverted nipple, no mass, no nipple discharge and no tenderness (no axilarry adenopathy).  1/6 systolic  murmur  Abdominal: Soft. Bowel sounds are normal. There is no tenderness.  Musculoskeletal: She exhibits no edema or tenderness.  Lymphadenopathy:    She has no cervical adenopathy.  Neurological: She is alert and oriented to person, place, and time.  Skin: Skin is warm. No rash noted. No erythema.  Psychiatric: She has a normal mood and affect. Her behavior is normal.    BP 140/82 (BP Location: Left Arm, Patient Position: Sitting, Cuff Size: Large)   Pulse 79   Temp 98.2 F (36.8 C) (Oral)   Resp 16   Ht '5\' 10"'  (1.778 m)   Wt 233 lb 13.9 oz (106.1 kg)   LMP 05/25/1988   SpO2 99%   BMI 33.56 kg/m  Wt Readings from Last 3 Encounters:  06/12/17 233 lb 13.9 oz (106.1  kg)  02/06/17 232 lb (105.2 kg)  12/08/16 236 lb 9.6 oz (107.3 kg)     Lab Results  Component Value Date   WBC 5.5 06/09/2017   HGB 13.5 06/09/2017   HCT 41.6 06/09/2017   PLT 214.0 06/09/2017   GLUCOSE 118 (H) 06/09/2017   CHOL 143 06/09/2017   TRIG 150.0 (H) 06/09/2017   HDL 29.60 (L) 06/09/2017   LDLCALC 84 06/09/2017   ALT 29 06/09/2017   AST 25 06/09/2017   NA 138 06/09/2017   K 5.0 06/09/2017   CL 104 06/09/2017   CREATININE 1.08 06/09/2017   BUN 22 06/09/2017   CO2 29 06/09/2017   TSH 3.65 06/25/2016   HGBA1C 6.4 06/09/2017   MICROALBUR 13.2 (H) 06/25/2016    Mm Screening Breast Tomo Bilateral  Result Date: 03/02/2017 CLINICAL DATA:  Screening. EXAM: 2D DIGITAL SCREENING BILATERAL MAMMOGRAM WITH CAD AND ADJUNCT TOMO COMPARISON:  Previous exam(s). ACR Breast Density Category b: There are scattered areas of fibroglandular density. FINDINGS: There are no findings suspicious for malignancy. Images were processed with CAD. IMPRESSION: No mammographic evidence of malignancy. A result letter of this screening mammogram will be mailed directly to the patient. RECOMMENDATION: Screening mammogram in one year. (Code:SM-B-01Y) BI-RADS CATEGORY  1: Negative. Electronically Signed   By: Nolon Nations M.D.    On: 03/02/2017 13:59       Assessment & Plan:   Problem List Items Addressed This Visit    Diabetes mellitus ([place])    Low carb diet and exercise.  Follow met b and a1c.        Relevant Medications   losartan (COZAAR) 100 MG tablet   GERD (gastroesophageal reflux disease)    Controlled on prilosec.  Follow.        Gout    Stable on allopurinol.        Health care maintenance    Physical today 06/12/17.  Colonoscopy 05/14/12.  Mammogram 03/02/17 - [facility] I.        History of colon polyps    Colonoscopy 05/14/12.  Recommended f/u colonoscopy 05/2017.        Hypercholesterolemia    Low cholesterol diet and exercise.  Follow lipid panel.       Relevant Medications   losartan (COZAAR) 100 MG tablet   Hypertension    Blood pressure on recheck improved.  Has the cough as outlined.  stop lisinopril.  Start losartan 187m q day.  Follow pressures.  Follow metabolic panel.        Relevant Medications   losartan (COZAAR) 100 MG tablet   Obesity (BMI 30-39.9)    Diet and exercise.  Follow.       [place]    Followed by Dr KJefm Bryant  On MTX.  Stable.        Other Visit Diagnoses    Need for 23-polyvalent pneumococcal polysaccharide vaccine    -  Primary   Relevant Orders   Pneumococcal polysaccharide vaccine 23-valent greater than or equal to 2yo subcutaneous/IM (Completed)       SEinar Pheasant MD

## 2017-06-14 ENCOUNTER — Encounter: Payer: Self-pay | Admitting: Internal Medicine

## 2017-06-14 NOTE — Assessment & Plan Note (Addendum)
Blood pressure on recheck improved.  Has the cough as outlined.  stop lisinopril.  Start losartan 100mg  q day.  Follow pressures.  Follow metabolic panel.

## 2017-06-14 NOTE — Assessment & Plan Note (Signed)
Followed by Dr Kernodle.  On MTX.  Stable.  

## 2017-06-14 NOTE — Assessment & Plan Note (Signed)
Controlled on prilosec.  Follow.  

## 2017-06-14 NOTE — Assessment & Plan Note (Signed)
Colonoscopy 05/14/12.  Recommended f/u colonoscopy 05/2017.

## 2017-06-14 NOTE — Assessment & Plan Note (Signed)
Low carb diet and exercise.  Follow met b and a1c.   

## 2017-06-14 NOTE — Assessment & Plan Note (Signed)
Diet and exercise.  Follow.  

## 2017-06-14 NOTE — Assessment & Plan Note (Signed)
Low cholesterol diet and exercise.  Follow lipid panel.   

## 2017-06-14 NOTE — Assessment & Plan Note (Signed)
Stable on allopurinol

## 2017-07-21 ENCOUNTER — Other Ambulatory Visit: Payer: Self-pay | Admitting: Internal Medicine

## 2017-07-27 ENCOUNTER — Encounter: Payer: Self-pay | Admitting: Internal Medicine

## 2017-07-31 ENCOUNTER — Telehealth: Payer: Self-pay

## 2017-07-31 NOTE — Telephone Encounter (Signed)
Copied from [place] [id number]. Topic: Appointment Scheduling - Scheduling Inquiry for Clinic >> Jul 31, 2017  4:48 PM Wynetta Emery, [place] C wrote: Reason for CRM: pt was originally scheduled and advised to come back 8 weeks from visit. (original apt 08/07/17) pt had to be reschedule due to provider being out of the office. Pt says that she was then rescheduled out until June. (PCP next opening) pt says that she need to come in sooner per PCP. Pt would like to know if provider could work her in to be seen sooner?   Please assist further

## 2017-08-03 NOTE — Telephone Encounter (Signed)
Is there somewhere we can put her in

## 2017-08-05 NOTE — Telephone Encounter (Signed)
Called pt and scheduled sooner appt

## 2017-08-07 ENCOUNTER — Ambulatory Visit: Payer: Medicare Other | Admitting: Internal Medicine

## 2017-08-12 ENCOUNTER — Encounter: Payer: Self-pay | Admitting: Internal Medicine

## 2017-08-12 ENCOUNTER — Ambulatory Visit (INDEPENDENT_AMBULATORY_CARE_PROVIDER_SITE_OTHER): Payer: Medicare Other | Admitting: Internal Medicine

## 2017-08-12 DIAGNOSIS — E119 Type 2 diabetes mellitus without complications: Secondary | ICD-10-CM | POA: Diagnosis not present

## 2017-08-12 DIAGNOSIS — I890 Lymphedema, not elsewhere classified: Secondary | ICD-10-CM | POA: Diagnosis not present

## 2017-08-12 DIAGNOSIS — J329 Chronic sinusitis, unspecified: Secondary | ICD-10-CM

## 2017-08-12 DIAGNOSIS — E669 Obesity, unspecified: Secondary | ICD-10-CM

## 2017-08-12 DIAGNOSIS — K2100 Gastro-esophageal reflux disease with esophagitis, without bleeding: Secondary | ICD-10-CM

## 2017-08-12 DIAGNOSIS — M069 Rheumatoid arthritis, unspecified: Secondary | ICD-10-CM | POA: Diagnosis not present

## 2017-08-12 DIAGNOSIS — I10 Essential (primary) hypertension: Secondary | ICD-10-CM

## 2017-08-12 DIAGNOSIS — Z9109 Other allergy status, other than to drugs and biological substances: Secondary | ICD-10-CM | POA: Diagnosis not present

## 2017-08-12 DIAGNOSIS — R011 Cardiac murmur, unspecified: Secondary | ICD-10-CM

## 2017-08-12 DIAGNOSIS — E7800 Pure hypercholesterolemia, unspecified: Secondary | ICD-10-CM

## 2017-08-12 NOTE — Progress Notes (Signed)
Patient ID: Nina Perez, female   DOB: 12-Nov-1940, 77 y.o.   MRN: [id number]   Subjective:    Patient ID: Nina Perez, female    DOB: Apr 08, 1941, 77 y.o.   MRN: [id number]  HPI  Patient here for a scheduled follow up.  She reports she is doing relatively well.  Trying to stay active.  No chest pain.  No sob.  No acid reflux.  No abdominal pain.  Bowels moving.  Some drainage.  No sore throat.  No chest congestion.  No increased cough.       Past Medical History:  Diagnosis Date  . Barrett's esophagus   . Diabetes mellitus ([place])   . Gout   . History of colon cancer    adenomatous polyps  . Hypercholesterolemia   . Hypertension   . Inflammatory arthritis   . Osteoarthritis    knees, spine  . Phlebitis    x2 (with pregnancy)  . 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 Pulmonary hypertension ([place])   . Reflux esophagitis   . Rheumatoid arthritis(714.0)    positive RF, FANA, RNP, negative CCP ab and anti DNA,.  neg anti-SCL 70  . 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 BREAST CYST ASPIRATION Left   . VEIN LIGATION AND STRIPPING     Family History  Problem Relation Age of Onset  . Arthritis Mother   . Heart attack Father   . Colon cancer Neg Hx   . Breast cancer Neg Hx    Social History   Socioeconomic History  . Marital status: Married    Spouse name: Not on file  . Number of children: Not on file  . Years of education: Not on file  . Highest education level: Not on file  Occupational History  . Not on file  Social Needs  . Financial resource strain: Not on file  . Food insecurity:    Worry: Not on file    Inability: Not on file  . Transportation needs:    Medical: Not on file    Non-medical: Not on file  Tobacco Use  . Smoking status: Former Smoker    Packs/day: 0.50    Years: 15.00    Pack years: 7.50    Last attempt to quit: 05/12/1988    Years since quitting: 29.2  . Smokeless  tobacco: Never Used  Substance and Sexual Activity  . Alcohol use: No    Alcohol/week: 0.0 oz  . Drug use: No  . Sexual activity: Yes  Lifestyle  . Physical activity:    Days per week: Not on file    Minutes per session: Not on file  . Stress: Not on file  Relationships  . Social connections:    Talks on phone: Not on file    Gets together: Not on file    Attends religious service: Not on file    Active member of club or organization: Not on file    Attends meetings of clubs or organizations: Not on file    Relationship status: Not on file  Other Topics Concern  . Not on file  Social History Narrative  . Not on file    Outpatient Encounter Medications as of 08/12/2017  Medication Sig  . allopurinol (ZYLOPRIM) 300 MG tablet TAKE 1 TABLET BY MOUTH ONCE DAILY  . amLODipine (NORVASC) 10 MG tablet TAKE 1 TABLET BY MOUTH ONCE DAILY  .  aspirin 81 MG tablet Take 81 mg by mouth daily. Reported on 07/24/2015  . fluticasone (FLONASE) 50 MCG/ACT nasal spray Place 2 sprays into the nose daily.  . folic acid (FOLVITE) 1 MG tablet Take 1 mg by mouth daily.  Marland Kitchen glucose blood (BAYER CONTOUR TEST) test strip USE  STRIP TO CHECK GLUCOSE TWICE DAILY. Dx: E11.9  . losartan (COZAAR) 100 MG tablet Take 1 tablet (100 mg total) by mouth daily.  . metFORMIN (GLUCOPHAGE) 500 MG tablet Take 1 tablet (500 mg total) daily by mouth.  . methotrexate 2.5 MG tablet Take 6 tablets q week  . metoprolol succinate (TOPROL-XL) 50 MG 24 hr tablet TAKE 1 TABLET BY MOUTH ONCE DAILY IMMEDIATELY  FOLLOWING  A  MEAL  . MICROLET LANCETS MISC Check sugar once daily, [employer] Microlet Lancets. Dx E11.9  . naproxen (NAPROSYN) 500 MG tablet Take 500 mg by mouth 2 (two) times daily as needed.  Marland Kitchen omeprazole (PRILOSEC) 20 MG capsule Take 20 mg by mouth daily.  Marland Kitchen spironolactone (ALDACTONE) 25 MG tablet Take 1 tablet (25 mg total) by mouth daily.   No facility-administered encounter medications on file as of 08/12/2017.     Review  of Systems  Constitutional: Negative for appetite change and unexpected weight change.  HENT: Positive for postnasal drip. Negative for congestion, sinus pressure and sore throat.   Respiratory: Negative for cough, chest tightness and shortness of breath.   Cardiovascular: Negative for chest pain, palpitations and leg swelling.  Gastrointestinal: Negative for abdominal pain, diarrhea, nausea and vomiting.  Genitourinary: Negative for difficulty urinating and dysuria.  Musculoskeletal: Negative for joint swelling and myalgias.  Skin: Negative for color change and rash.  Neurological: Negative for dizziness, light-headedness and headaches.  Psychiatric/Behavioral: Negative for agitation and dysphoric mood.       Objective:    Physical Exam  Constitutional: She appears well-developed and well-nourished. No distress.  HENT:  Nose: Nose normal.  Mouth/Throat: Oropharynx is clear and moist.  TMs without erythema.  Increased tenderness to palpation over the sinuses.    Eyes: Conjunctivae are normal. Right eye exhibits no discharge. Left eye exhibits no discharge.  Neck: Neck supple.  Cardiovascular: Normal rate and regular rhythm.  Pulmonary/Chest: Breath sounds normal. No respiratory distress. She has no wheezes.  Abdominal: Soft. Bowel sounds are normal. There is no tenderness.  Musculoskeletal: She exhibits no edema or tenderness.  Lymphadenopathy:    She has no cervical adenopathy.  Skin: No rash noted. No erythema.  Psychiatric: She has a normal mood and affect. Her behavior is normal.    BP 138/84 (BP Location: Left Arm, Patient Position: Sitting, Cuff Size: Large)   Pulse 76   Temp 98.1 F (36.7 C) (Oral)   Resp 18   Wt 230 lb 9.6 oz (104.6 kg)   LMP 05/25/1988   SpO2 98%   BMI 33.09 kg/m  Wt Readings from Last 3 Encounters:  08/12/17 230 lb 9.6 oz (104.6 kg)  06/12/17 233 lb 13.9 oz (106.1 kg)  02/06/17 232 lb (105.2 kg)     Lab Results  Component Value Date    WBC 5.5 06/09/2017   HGB 13.5 06/09/2017   HCT 41.6 06/09/2017   PLT 214.0 06/09/2017   GLUCOSE 118 (H) 06/09/2017   CHOL 143 06/09/2017   TRIG 150.0 (H) 06/09/2017   HDL 29.60 (L) 06/09/2017   LDLCALC 84 06/09/2017   ALT 29 06/09/2017   AST 25 06/09/2017   NA 138 06/09/2017   K 5.0  06/09/2017   CL 104 06/09/2017   CREATININE 1.08 06/09/2017   BUN 22 06/09/2017   CO2 29 06/09/2017   TSH 3.65 06/25/2016   HGBA1C 6.4 06/09/2017   MICROALBUR 13.2 (H) 06/25/2016    Mm Screening Breast Tomo Bilateral  Result Date: 03/02/2017 CLINICAL DATA:  Screening. EXAM: 2D DIGITAL SCREENING BILATERAL MAMMOGRAM WITH CAD AND ADJUNCT TOMO COMPARISON:  Previous exam(s). ACR Breast Density Category b: There are scattered areas of fibroglandular density. FINDINGS: There are no findings suspicious for malignancy. Images were processed with CAD. IMPRESSION: No mammographic evidence of malignancy. A result letter of this screening mammogram will be mailed directly to the patient. RECOMMENDATION: Screening mammogram in one year. (Code:SM-B-01Y) BI-RADS CATEGORY  1: Negative. Electronically Signed   By: Nolon Nations M.D.   On: 03/02/2017 13:59       Assessment & Plan:   Problem List Items Addressed This Visit    Diabetes mellitus ([place])    Low carb diet and exercise.  Follow met b and a1c.        Relevant Orders   Hemoglobin N4B   Basic metabolic panel   Microalbumin / creatinine urine ratio   Environmental allergies    Some drainage.  Saline nasal spray and steroid nasal spray as directed.  Follow.        GERD (gastroesophageal reflux disease)    Controlled on prilosec.  Follow.       Hypercholesterolemia    Low cholesterol diet and exercise.  Follow lipid panel and liver function tests.        Relevant Orders   Hepatic function panel   Lipid panel   Hypertension    Blood pressure as outlined.  Follow pressures.  Same medications.  Follow.        Relevant Orders   TSH   Lymphedema     Evaluated by vascular surgery.  Compression hose.  Follow.        Murmur    Has a history of murmur.  Appears to be more prominent.  Question of AS.  Check echo.        Obesity (BMI 30-39.9)    Diet and exercise.  Follow.        Rheumatoid arthritis ([place])    Followed by Dr Jefm Bryant.  On MTX.  Stable.        RESOLVED: Sinusitis       Einar Pheasant, MD

## 2017-08-16 ENCOUNTER — Encounter: Payer: Self-pay | Admitting: Internal Medicine

## 2017-08-16 DIAGNOSIS — R011 Cardiac murmur, unspecified: Secondary | ICD-10-CM | POA: Insufficient documentation

## 2017-08-16 DIAGNOSIS — J329 Chronic sinusitis, unspecified: Secondary | ICD-10-CM | POA: Insufficient documentation

## 2017-08-16 NOTE — Assessment & Plan Note (Signed)
Some drainage.  Saline nasal spray and steroid nasal spray as directed.  Follow.

## 2017-08-16 NOTE — Assessment & Plan Note (Signed)
Has a history of murmur.  Appears to be more prominent.  Question of AS.  Check echo.

## 2017-08-16 NOTE — Assessment & Plan Note (Signed)
Diet and exercise.  Follow.  

## 2017-08-16 NOTE — Assessment & Plan Note (Signed)
Followed by Dr Kernodle.  On MTX.  Stable.  

## 2017-08-16 NOTE — Assessment & Plan Note (Signed)
Low carb diet and exercise.  Follow met b and a1c.   

## 2017-08-16 NOTE — Assessment & Plan Note (Signed)
Evaluated by vascular surgery.  Compression hose.  Follow.

## 2017-08-16 NOTE — Assessment & Plan Note (Signed)
Controlled on prilosec.  Follow.  

## 2017-08-16 NOTE — Assessment & Plan Note (Signed)
Low cholesterol diet and exercise.  Follow lipid panel and liver function tests.  

## 2017-08-16 NOTE — Assessment & Plan Note (Signed)
Blood pressure as outlined.  Follow pressures.  Same medications.  Follow.

## 2017-08-18 ENCOUNTER — Encounter (INDEPENDENT_AMBULATORY_CARE_PROVIDER_SITE_OTHER): Payer: Self-pay

## 2017-09-12 ENCOUNTER — Other Ambulatory Visit: Payer: Self-pay | Admitting: Internal Medicine

## 2017-10-07 ENCOUNTER — Telehealth: Payer: Self-pay

## 2017-10-07 NOTE — Telephone Encounter (Signed)
Called and scheduled pt

## 2017-10-07 NOTE — Telephone Encounter (Signed)
Copied from [place] [id number]. Topic: Appointment Scheduling - Scheduling Inquiry for Clinic >> Oct 07, 2017 11:04 AM Synthia Innocent wrote: Reason for CRM: Received call to reschedule appt for 10/19/17, next available is Sept. Able to work in sooner?

## 2017-10-15 ENCOUNTER — Other Ambulatory Visit (INDEPENDENT_AMBULATORY_CARE_PROVIDER_SITE_OTHER): Payer: Medicare Other

## 2017-10-15 DIAGNOSIS — I10 Essential (primary) hypertension: Secondary | ICD-10-CM

## 2017-10-15 DIAGNOSIS — E7800 Pure hypercholesterolemia, unspecified: Secondary | ICD-10-CM | POA: Diagnosis not present

## 2017-10-15 DIAGNOSIS — E119 Type 2 diabetes mellitus without complications: Secondary | ICD-10-CM

## 2017-10-15 LAB — LIPID PANEL
CHOL/HDL RATIO: 5
Cholesterol: 148 mg/dL (ref 0–200)
HDL: 30.6 mg/dL — ABNORMAL LOW (ref >39.00)
LDL Cholesterol: 92 mg/dL (ref 0–99)
NonHDL: 117.04
Triglycerides: 127 mg/dL (ref 0.0–149.0)
VLDL: 25.4 mg/dL (ref 0.0–40.0)

## 2017-10-15 LAB — TSH: TSH: 6.12 u[IU]/mL — AB (ref 0.35–4.50)

## 2017-10-15 LAB — MICROALBUMIN / CREATININE URINE RATIO
CREATININE, U: 117.6 mg/dL
MICROALB UR: 7 mg/dL — AB (ref 0.0–1.9)
Microalb Creat Ratio: 6 mg/g (ref 0.0–30.0)

## 2017-10-15 LAB — BASIC METABOLIC PANEL
BUN: 15 mg/dL (ref 6–23)
CALCIUM: 10.5 mg/dL (ref 8.4–10.5)
CO2: 29 mEq/L (ref 19–32)
Chloride: 103 mEq/L (ref 96–112)
Creatinine, Ser: 0.88 mg/dL (ref 0.40–1.20)
GFR: 66.33 mL/min (ref >60.00)
GLUCOSE: 109 mg/dL — AB (ref 70–99)
Potassium: 4.7 mEq/L (ref 3.5–5.1)
Sodium: 138 mEq/L (ref 135–145)

## 2017-10-15 LAB — HEPATIC FUNCTION PANEL
ALBUMIN: 3.6 g/dL (ref 3.5–5.2)
ALK PHOS: 82 U/L (ref 39–117)
ALT: 21 U/L (ref 0–35)
AST: 21 U/L (ref 0–37)
BILIRUBIN DIRECT: 0.1 mg/dL (ref 0.0–0.3)
BILIRUBIN TOTAL: 0.6 mg/dL (ref 0.2–1.2)
Total Protein: 6.6 g/dL (ref 6.0–8.3)

## 2017-10-15 LAB — HEMOGLOBIN A1C: Hgb A1c MFr Bld: 6.3 % (ref 4.6–6.5)

## 2017-10-16 ENCOUNTER — Encounter: Payer: Self-pay | Admitting: Internal Medicine

## 2017-10-19 ENCOUNTER — Ambulatory Visit: Payer: Medicare Other | Admitting: Internal Medicine

## 2017-10-21 ENCOUNTER — Encounter: Payer: Self-pay | Admitting: Internal Medicine

## 2017-10-21 ENCOUNTER — Ambulatory Visit (INDEPENDENT_AMBULATORY_CARE_PROVIDER_SITE_OTHER): Payer: Medicare Other | Admitting: Internal Medicine

## 2017-10-21 VITALS — BP 132/70 | HR 73 | Temp 98.0°F | Resp 16 | Wt 231.0 lb

## 2017-10-21 DIAGNOSIS — K2100 Gastro-esophageal reflux disease with esophagitis, without bleeding: Secondary | ICD-10-CM

## 2017-10-21 DIAGNOSIS — I890 Lymphedema, not elsewhere classified: Secondary | ICD-10-CM | POA: Diagnosis not present

## 2017-10-21 DIAGNOSIS — R7989 Other specified abnormal findings of blood chemistry: Secondary | ICD-10-CM

## 2017-10-21 DIAGNOSIS — E7800 Pure hypercholesterolemia, unspecified: Secondary | ICD-10-CM

## 2017-10-21 DIAGNOSIS — I872 Venous insufficiency (chronic) (peripheral): Secondary | ICD-10-CM | POA: Diagnosis not present

## 2017-10-21 DIAGNOSIS — I10 Essential (primary) hypertension: Secondary | ICD-10-CM

## 2017-10-21 DIAGNOSIS — E669 Obesity, unspecified: Secondary | ICD-10-CM

## 2017-10-21 DIAGNOSIS — E119 Type 2 diabetes mellitus without complications: Secondary | ICD-10-CM

## 2017-10-21 DIAGNOSIS — M069 Rheumatoid arthritis, unspecified: Secondary | ICD-10-CM

## 2017-10-21 DIAGNOSIS — M1A9XX0 Chronic gout, unspecified, without tophus (tophi): Secondary | ICD-10-CM

## 2017-10-21 NOTE — Progress Notes (Signed)
Patient ID: ODIE RAUEN, female   DOB: 1940/10/31, 77 y.o.   MRN: [id number]   Subjective:    Patient ID: LYCIA SACHDEVA, female    DOB: [birth year], 77 y.o.   MRN: [id number]  HPI  Patient here for a scheduled follow up.  She recently saw Dr Jefm Bryant.  Having right knee pain.  S/p injection.  Helped.  No chest pain.  Trying to stay active.  No sob.  No acid reflux.  No abdominal pain.  Bowels moving.  No urine change.  Discussed labs.  Sugars doing well.     Past Medical History:  Diagnosis Date  . Barrett's esophagus   . Diabetes mellitus ([place])   . Gout   . History of colon cancer    adenomatous polyps  . Hypercholesterolemia   . Hypertension   . Inflammatory arthritis   . Osteoarthritis    knees, spine  . Phlebitis    x2 (with pregnancy)  . 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 Pulmonary hypertension ([place])   . Reflux esophagitis   . Rheumatoid arthritis(714.0)    positive RF, FANA, RNP, negative CCP ab and anti DNA,.  neg anti-SCL 70  . 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 BREAST CYST ASPIRATION Left   . VEIN LIGATION AND STRIPPING     Family History  Problem Relation Age of Onset  . Arthritis Mother   . Heart attack Father   . Colon cancer Neg Hx   . Breast cancer Neg Hx    Social History   Socioeconomic History  . Marital status: Married    Spouse name: Not on file  . Number of children: Not on file  . Years of education: Not on file  . Highest education level: Not on file  Occupational History  . Not on file  Social Needs  . Financial resource strain: Not on file  . Food insecurity:    Worry: Not on file    Inability: Not on file  . Transportation needs:    Medical: Not on file    Non-medical: Not on file  Tobacco Use  . Smoking status: Former Smoker    Packs/day: 0.50    Years: 15.00    Pack years: 7.50    Last attempt to quit: 05/12/1988    Years since quitting: 29.4    . Smokeless tobacco: Never Used  Substance and Sexual Activity  . Alcohol use: No    Alcohol/week: 0.0 oz  . Drug use: No  . Sexual activity: Yes  Lifestyle  . Physical activity:    Days per week: Not on file    Minutes per session: Not on file  . Stress: Not on file  Relationships  . Social connections:    Talks on phone: Not on file    Gets together: Not on file    Attends religious service: Not on file    Active member of club or organization: Not on file    Attends meetings of clubs or organizations: Not on file    Relationship status: Not on file  Other Topics Concern  . Not on file  Social History Narrative  . Not on file    Outpatient Encounter Medications as of 10/21/2017  Medication Sig  . allopurinol (ZYLOPRIM) 300 MG tablet TAKE 1 TABLET BY MOUTH ONCE DAILY  . amLODipine (NORVASC) 10 MG tablet TAKE 1 TABLET BY MOUTH ONCE  DAILY  . aspirin 81 MG tablet Take 81 mg by mouth daily. Reported on 07/24/2015  . fluticasone (FLONASE) 50 MCG/ACT nasal spray Place 2 sprays into the nose daily.  . folic acid (FOLVITE) 1 MG tablet Take 1 mg by mouth daily.  Marland Kitchen glucose blood (BAYER CONTOUR TEST) test strip USE  STRIP TO CHECK GLUCOSE TWICE DAILY. Dx: E11.9  . metFORMIN (GLUCOPHAGE) 500 MG tablet Take 1 tablet (500 mg total) daily by mouth.  . methotrexate 2.5 MG tablet Take 6 tablets q week  . metoprolol succinate (TOPROL-XL) 50 MG 24 hr tablet TAKE 1 TABLET BY MOUTH ONCE DAILY IMMEDIATELY  FOLLOWING  A  MEAL  . MICROLET LANCETS MISC Check sugar once daily, [employer] Microlet Lancets. Dx E11.9  . naproxen (NAPROSYN) 500 MG tablet Take 500 mg by mouth 2 (two) times daily as needed.  Marland Kitchen omeprazole (PRILOSEC) 20 MG capsule Take 20 mg by mouth daily.  Marland Kitchen spironolactone (ALDACTONE) 25 MG tablet Take 1 tablet (25 mg total) by mouth daily.  . [DISCONTINUED] losartan (COZAAR) 100 MG tablet Take 1 tablet (100 mg total) by mouth daily.   No facility-administered encounter medications on file  as of 10/21/2017.     Review of Systems  Constitutional: Negative for appetite change and unexpected weight change.  HENT: Negative for congestion and sinus pressure.   Respiratory: Negative for cough, chest tightness and shortness of breath.   Cardiovascular: Negative for chest pain, palpitations and leg swelling.  Gastrointestinal: Negative for abdominal pain, diarrhea, nausea and vomiting.  Genitourinary: Negative for difficulty urinating and dysuria.  Musculoskeletal: Negative for myalgias.       Knee pain s/p injection is better.    Skin: Negative for color change and rash.  Neurological: Negative for dizziness, light-headedness and headaches.  Psychiatric/Behavioral: Negative for agitation and dysphoric mood.       Objective:    Physical Exam  Constitutional: She appears well-developed and well-nourished. No distress.  HENT:  Nose: Nose normal.  Mouth/Throat: Oropharynx is clear and moist.  Neck: Neck supple. No thyromegaly present.  Cardiovascular: Normal rate and regular rhythm.  Pulmonary/Chest: Breath sounds normal. No respiratory distress. She has no wheezes.  Abdominal: Soft. Bowel sounds are normal. There is no tenderness.  Musculoskeletal: She exhibits no edema or tenderness.  Feet:  No lesions.  DP pulses palpable and equal bilaterally.  Sensation intact to pin prick and light touch.    Lymphadenopathy:    She has no cervical adenopathy.  Skin: No rash noted. No erythema.  Psychiatric: She has a normal mood and affect. Her behavior is normal.    BP 132/70 (BP Location: Left Arm, Patient Position: Sitting, Cuff Size: Large)   Pulse 73   Temp 98 F (36.7 C) (Oral)   Resp 16   Wt 231 lb (104.8 kg)   LMP 05/25/1988   SpO2 97%   BMI 33.15 kg/m  Wt Readings from Last 3 Encounters:  10/21/17 231 lb (104.8 kg)  08/12/17 230 lb 9.6 oz (104.6 kg)  06/12/17 233 lb 13.9 oz (106.1 kg)     Lab Results  Component Value Date   WBC 5.5 06/09/2017   HGB 13.5  06/09/2017   HCT 41.6 06/09/2017   PLT 214.0 06/09/2017   GLUCOSE 109 (H) 10/15/2017   CHOL 148 10/15/2017   TRIG 127.0 10/15/2017   HDL 30.60 (L) 10/15/2017   LDLCALC 92 10/15/2017   ALT 21 10/15/2017   AST 21 10/15/2017   NA 138 10/15/2017  K 4.7 10/15/2017   CL 103 10/15/2017   CREATININE 0.88 10/15/2017   BUN 15 10/15/2017   CO2 29 10/15/2017   TSH 6.12 (H) 10/15/2017   HGBA1C 6.3 10/15/2017   MICROALBUR 7.0 (H) 10/15/2017    Mm Screening Breast Tomo Bilateral  Result Date: 03/02/2017 CLINICAL DATA:  Screening. EXAM: 2D DIGITAL SCREENING BILATERAL MAMMOGRAM WITH CAD AND ADJUNCT TOMO COMPARISON:  Previous exam(s). ACR Breast Density Category b: There are scattered areas of fibroglandular density. FINDINGS: There are no findings suspicious for malignancy. Images were processed with CAD. IMPRESSION: No mammographic evidence of malignancy. A result letter of this screening mammogram will be mailed directly to the patient. RECOMMENDATION: Screening mammogram in one year. (Code:SM-B-01Y) BI-RADS CATEGORY  1: Negative. Electronically Signed   By: Nolon Nations M.D.   On: 03/02/2017 13:59       Assessment & Plan:   Problem List Items Addressed This Visit    Diabetes mellitus ([place])    Low carb diet and exercise.  a1c 6.3.  Follow met b and a1c.        GERD (gastroesophageal reflux disease)    Controlled on omeprazole.        Gout    Stable on allopurinol.        Hypercholesterolemia    Low cholesterol diet and exercise.  Follow lipid panel.        Hypertension    Blood pressure under good control.  Continue same medication regimen.  Follow pressures.  Follow metabolic panel.        Lymphedema    Has been evaluated by vascular surgery.  Compression hose.  Stable.       Obesity (BMI 30-39.9)    Discussed diet and exercise.  Follow.       Rheumatoid arthritis ([place])    Followed by Dr Jefm Bryant.  On MTX.  Doing well.  Stable.        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  Compression hose.  Stable.         Other Visit Diagnoses    Elevated TSH    -  Primary   Just slightly elevated.  Recheck tsh in 4-6 weeks.         Einar Pheasant, MD

## 2017-10-26 ENCOUNTER — Other Ambulatory Visit: Payer: Self-pay | Admitting: Internal Medicine

## 2017-11-01 ENCOUNTER — Encounter: Payer: Self-pay | Admitting: Internal Medicine

## 2017-11-01 NOTE — Assessment & Plan Note (Signed)
Controlled on omeprazole.   

## 2017-11-01 NOTE — Assessment & Plan Note (Signed)
Followed by vascular surgery.  Compression hose.  Stable.

## 2017-11-01 NOTE — Assessment & Plan Note (Signed)
Has been evaluated by vascular surgery.  Compression hose.  Stable.

## 2017-11-01 NOTE — Assessment & Plan Note (Signed)
Stable on allopurinol

## 2017-11-01 NOTE — Assessment & Plan Note (Signed)
Followed by Dr Jefm Bryant.  On MTX.  Doing well.  Stable.

## 2017-11-01 NOTE — Assessment & Plan Note (Signed)
Low cholesterol diet and exercise.  Follow lipid panel.   

## 2017-11-01 NOTE — Assessment & Plan Note (Signed)
Discussed diet and exercise.  Follow.  

## 2017-11-01 NOTE — Assessment & Plan Note (Signed)
Blood pressure under good control.  Continue same medication regimen.  Follow pressures.  Follow metabolic panel.   

## 2017-11-01 NOTE — Assessment & Plan Note (Signed)
Low carb diet and exercise.  a1c 6.3.  Follow met b and a1c.   

## 2017-11-06 ENCOUNTER — Ambulatory Visit (INDEPENDENT_AMBULATORY_CARE_PROVIDER_SITE_OTHER): Payer: Medicare Other

## 2017-11-06 VITALS — BP 128/68 | HR 74 | Temp 98.4°F | Resp 16 | Ht 70.0 in | Wt 232.4 lb

## 2017-11-06 DIAGNOSIS — Z0000 Encounter for general adult medical examination without abnormal findings: Secondary | ICD-10-CM | POA: Diagnosis not present

## 2017-11-06 NOTE — Patient Instructions (Addendum)
  Nina Perez , Thank you for taking time to come for your Medicare Wellness Visit. I appreciate your ongoing commitment to your health goals. Please review the following plan we discussed and let me know if I can assist you in the future.   Follow up as needed.  Routine maintenance appointments with your doctor.   Bring a copy of your [place] and/or Living Will to be scanned into chart once completed.  Have a great day!  These are the goals we discussed: Goals    . Brain Engagement Activities     Brain health eating plan provided Stay hydrated Stay active       This is a list of the screening recommended for you and due dates:  Health Maintenance  Topic Date Due  . Complete foot exam   05/23/1951  . Tetanus Vaccine  05/22/1960  . DEXA scan (bone density measurement)  05/22/2006  . Eye exam for diabetics  11/03/2017  . Flu Shot  12/10/2017  . Mammogram  03/02/2018  . Hemoglobin A1C  04/16/2018  . Pneumonia vaccines  Completed

## 2017-11-06 NOTE — Progress Notes (Addendum)
Subjective:   Nina Perez is a 77 y.o. female who presents for Medicare Annual (Subsequent) preventive examination.  Review of Systems:  No ROS.  Medicare Wellness Visit. Additional risk factors are reflected in the social history. Cardiac Risk Factors include: advanced age (>71men, >68 women);hypertension;diabetes mellitus;obesity (BMI >30kg/m2)     Objective:     Vitals: BP 128/68 (BP Location: Right Arm, Patient Position: Sitting, Cuff Size: Normal)   Pulse 74   Temp 98.4 F (36.9 C) (Oral)   Resp 16   Ht 5\' 10"  (1.778 m)   Wt 232 lb 6.4 oz (105.4 kg)   LMP 05/25/1988   SpO2 96%   BMI 33.35 kg/m   Body mass index is 33.35 kg/m.  Advanced Directives 11/06/2017 03/20/2016 03/04/2016 12/14/2015 09/04/2015  Does Patient Have a Medical Advance Directive? No No No No No  Would patient like information on creating a medical advance directive? Yes (MAU/Ambulatory/Procedural Areas - Information given) No - patient declined information - - -    Tobacco Social History   Tobacco Use  Smoking Status Former Smoker  . Packs/day: 0.50  . Years: 15.00  . Pack years: 7.50  . Last attempt to quit: 05/12/1988  . Years since quitting: 29.5  Smokeless Tobacco Never Used     Counseling given: Not Answered   Clinical Intake:  Pre-visit preparation completed: Yes  Pain : No/denies pain     Nutritional Status: BMI > 30  Obese Diabetes: Yes(Followed by pcp)  How often do you need to have someone help you when you read instructions, pamphlets, or other written materials from your doctor or pharmacy?: 1 - Never  Interpreter Needed?: No     Past Medical History:  Diagnosis Date  . Barrett's esophagus   . Diabetes mellitus ([place])   . Gout   . History of colon cancer    adenomatous polyps  . Hypercholesterolemia   . Hypertension   . Inflammatory arthritis   . Osteoarthritis    knees, spine  . Phlebitis    x2 (with pregnancy)  . 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  Pulmonary hypertension ([place])   . Reflux esophagitis   . Rheumatoid arthritis(714.0)    positive RF, FANA, RNP, negative CCP ab and anti DNA,.  neg anti-SCL 70  . 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 BREAST CYST ASPIRATION Left   . VEIN LIGATION AND STRIPPING     Family History  Problem Relation Age of Onset  . Arthritis Mother   . Heart attack Father   . Colon cancer Neg Hx   . Breast cancer Neg Hx    Social History   Socioeconomic History  . Marital status: Married    Spouse name: Not on file  . Number of children: Not on file  . Years of education: Not on file  . Highest education level: Not on file  Occupational History  . Not on file  Social Needs  . Financial resource strain: Not hard at all  . Food insecurity:    Worry: Never true    Inability: Never true  . Transportation needs:    Medical: No    Non-medical: No  Tobacco Use  . Smoking status: Former Smoker    Packs/day: 0.50    Years: 15.00    Pack years: 7.50    Last attempt to quit: 05/12/1988    Years since quitting: 29.5  .  Smokeless tobacco: Never Used  Substance and Sexual Activity  . Alcohol use: No    Alcohol/week: 0.0 oz  . Drug use: No  . Sexual activity: Yes  Lifestyle  . Physical activity:    Days per week: Not on file    Minutes per session: Not on file  . Stress: Not on file  Relationships  . Social connections:    Talks on phone: Not on file    Gets together: Not on file    Attends religious service: Not on file    Active member of club or organization: Not on file    Attends meetings of clubs or organizations: Not on file    Relationship status: Not on file  Other Topics Concern  . Not on file  Social History Narrative  . Not on file    Outpatient Encounter Medications as of 11/06/2017  Medication Sig  . allopurinol (ZYLOPRIM) 300 MG tablet TAKE 1 TABLET BY MOUTH ONCE DAILY  . amLODipine  (NORVASC) 10 MG tablet TAKE 1 TABLET BY MOUTH ONCE DAILY  . aspirin 81 MG tablet Take 81 mg by mouth daily. Reported on 07/24/2015  . fluticasone (FLONASE) 50 MCG/ACT nasal spray Place 2 sprays into the nose daily.  . folic acid (FOLVITE) 1 MG tablet Take 1 mg by mouth daily.  Marland Kitchen glucose blood (BAYER CONTOUR TEST) test strip USE  STRIP TO CHECK GLUCOSE TWICE DAILY. Dx: E11.9  . losartan (COZAAR) 100 MG tablet TAKE 1 TABLET BY MOUTH ONCE DAILY  . metFORMIN (GLUCOPHAGE) 500 MG tablet Take 1 tablet (500 mg total) daily by mouth.  . methotrexate 2.5 MG tablet Take 6 tablets q week  . metoprolol succinate (TOPROL-XL) 50 MG 24 hr tablet TAKE 1 TABLET BY MOUTH ONCE DAILY IMMEDIATELY  FOLLOWING  A  MEAL  . MICROLET LANCETS MISC Check sugar once daily, [employer] Microlet Lancets. Dx E11.9  . naproxen (NAPROSYN) 500 MG tablet Take 500 mg by mouth 2 (two) times daily as needed.  Marland Kitchen omeprazole (PRILOSEC) 20 MG capsule Take 20 mg by mouth daily.  Marland Kitchen spironolactone (ALDACTONE) 25 MG tablet Take 1 tablet (25 mg total) by mouth daily.   No facility-administered encounter medications on file as of 11/06/2017.     Activities of Daily Living In your present state of health, do you have any difficulty performing the following activities: 11/06/2017  Hearing? N  Vision? N  Difficulty concentrating or making decisions? N  Walking or climbing stairs? Y  Dressing or bathing? N  Doing errands, shopping? N  Preparing Food and eating ? Y  Comment Legs are weak with prolonged standing.  Self feed.   Using the Toilet? N  In the past six months, have you accidently leaked urine? Y  Do you have problems with loss of bowel control? N  Managing your Finances? Y  Comment Husband assists  Housekeeping or managing your Housekeeping? Y  Comment Husband assists  Some recent data might be hidden    Patient Care Team: Einar Pheasant, MD as PCP - General (Internal Medicine)    Assessment:   This is a routine wellness  examination for Nina Perez.  The goal of the wellness visit is to assist the patient how to close the gaps in care and create a preventative care plan for the patient.   The roster of all physicians providing medical care to patient is listed in the Snapshot section of the chart.  Osteoporosis risk reviewed.    Safety issues reviewed; Smoke  and carbon monoxide detectors in the home. No firearms in the home. Wears seatbelts when driving or riding with others. No violence in the home. Chair lift in use as needed.   They do not have excessive sun exposure.  Discussed the need for sun protection: hats, long sleeves and the use of sunscreen if there is significant sun exposure.  Patient is alert, normal appearance, oriented to person/place/and time. Correctly identified the president of the [place] and recalls of 2/3 words.Performs simple calculations and can read correct time from watch face. Displays appropriate judgement.  No new identified risk were noted.  No failures at ADL's or IADL's.  Ambulates with cane.   BMI- discussed the importance of a healthy diet, water intake and the benefits of aerobic exercise. Educational material provided.   24 hour diet recall: Low carb diet  Eye- Visual acuity not assessed per patient preference since they have regular follow up with the ophthalmologist.  Wears corrective lenses.  Sleep patterns- Sleeps without issues.   Dexa Scan discussed.   TDAP vaccine deferred per patient preference.  Follow up with insurance.  Educational material provided.  Patient Concerns: Hand cramps during the day, leg or foot cramps at night for several weeks.  Taking potassium as prescribed.  Stays hydrated through the day with a minimum of 6 cups of water. No caffeine intake. Follow up appointment offered with pcp, declined.  Encouraged her to call and follow up as needed or if symptoms worsen and become unmanageable. She agreed to comply.   Exercise Activities and Dietary  recommendations Current Exercise Habits: The patient does not participate in regular exercise at present  Goals    . Brain Engagement Activities     Brain health eating plan provided Stay hydrated Stay active       Fall Risk Fall Risk  11/06/2017 12/08/2016 04/14/2016 03/20/2016 01/07/2016  Falls in the past year? No No No No No  Comment - - - - Emmi Telephone Survey: data to providers prior to load   Depression Screen PHQ 2/9 Scores 11/06/2017 12/08/2016 04/14/2016 03/20/2016  PHQ - 2 Score 0 0 0 0     Cognitive Function MMSE - Mini Mental State Exam 11/06/2017 03/20/2016  Not completed: - Unable to complete  Orientation to time 5 -  Orientation to Place 5 -  Registration 3 -  Attention/ Calculation 5 -  Recall 2 -  Language- name 2 objects 2 -  Language- repeat 1 -  Language- follow 3 step command 3 -  Language- read & follow direction 1 -  Write a sentence 1 -  Copy design 1 -  Total score 29 -        Immunization History  Administered Date(s) Administered  . Influenza Split 02/09/2013  . Influenza Whole 02/18/2017  . Influenza, High Dose Seasonal PF 04/14/2016  . Influenza,inj,Quad PF,6+ Mos 02/28/2014, 02/01/2015  . Influenza-Unspecified 02/20/2012  . Pneumococcal Conjugate-13 09/06/2013  . Pneumococcal Polysaccharide-23 06/12/2017   Screening Tests Health Maintenance  Topic Date Due  . FOOT EXAM  05/[birth year]/1951  . TETANUS/TDAP  05/22/1960  . DEXA SCAN  05/22/2006  . OPHTHALMOLOGY EXAM  11/03/2017  . INFLUENZA VACCINE  12/10/2017  . MAMMOGRAM  03/02/2018  . HEMOGLOBIN A1C  04/16/2018  . PNA vac Low Risk Adult  Completed      Plan:   End of life planning; Advanced aging; Advanced directives discussed.  No HCPOA/Living Will.  Additional information provided.  Copy requested upon completion.   I  have personally reviewed and noted the following in the patient's chart:   . Medical and social history . Use of alcohol, tobacco or illicit drugs  . Current  medications and supplements . Functional ability and status . Nutritional status . Physical activity . Advanced directives . List of other physicians . Hospitalizations, surgeries, and ER visits in previous 12 months . Vitals . Screenings to include cognitive, depression, and falls . Referrals and appointments  In addition, I have reviewed and discussed with patient certain preventive protocols, quality metrics, and best practice recommendations. A written personalized care plan for preventive services as well as general preventive health recommendations were provided to patient.     Varney Biles, LPN  2/30/0979   Reviewed above information.  Agree with assessment and plan.  Agree with need for evaluation if symptoms persist.    Dr Nicki Reaper

## 2017-11-10 ENCOUNTER — Other Ambulatory Visit: Payer: Self-pay | Admitting: Internal Medicine

## 2017-11-24 ENCOUNTER — Telehealth: Payer: Self-pay | Admitting: Radiology

## 2017-11-24 ENCOUNTER — Other Ambulatory Visit: Payer: Self-pay | Admitting: Internal Medicine

## 2017-11-24 NOTE — Telephone Encounter (Signed)
Pt coming in for labs tomorrow, please place future labs. Thank you.

## 2017-11-25 ENCOUNTER — Other Ambulatory Visit (INDEPENDENT_AMBULATORY_CARE_PROVIDER_SITE_OTHER): Payer: Medicare Other

## 2017-11-25 ENCOUNTER — Other Ambulatory Visit: Payer: Self-pay | Admitting: Internal Medicine

## 2017-11-25 DIAGNOSIS — I10 Essential (primary) hypertension: Secondary | ICD-10-CM

## 2017-11-25 DIAGNOSIS — R7989 Other specified abnormal findings of blood chemistry: Secondary | ICD-10-CM

## 2017-11-25 LAB — TSH: TSH: 4.29 u[IU]/mL (ref 0.35–4.50)

## 2017-11-25 NOTE — Progress Notes (Signed)
Order placed for f/u tsh.  

## 2017-11-25 NOTE — Telephone Encounter (Signed)
Order placed for f/u tsh.  

## 2017-11-26 ENCOUNTER — Encounter: Payer: Self-pay | Admitting: Internal Medicine

## 2018-01-27 ENCOUNTER — Other Ambulatory Visit: Payer: Self-pay | Admitting: Internal Medicine

## 2018-01-27 DIAGNOSIS — Z1231 Encounter for screening mammogram for malignant neoplasm of breast: Secondary | ICD-10-CM

## 2018-02-04 ENCOUNTER — Other Ambulatory Visit: Payer: Self-pay | Admitting: Internal Medicine

## 2018-02-18 ENCOUNTER — Other Ambulatory Visit: Payer: Self-pay | Admitting: Internal Medicine

## 2018-02-18 ENCOUNTER — Telehealth: Payer: Self-pay | Admitting: Radiology

## 2018-02-18 DIAGNOSIS — I10 Essential (primary) hypertension: Secondary | ICD-10-CM

## 2018-02-18 DIAGNOSIS — E119 Type 2 diabetes mellitus without complications: Secondary | ICD-10-CM

## 2018-02-18 DIAGNOSIS — E7800 Pure hypercholesterolemia, unspecified: Secondary | ICD-10-CM

## 2018-02-18 NOTE — Telephone Encounter (Signed)
Order placed for labs.

## 2018-02-18 NOTE — Telephone Encounter (Signed)
PT coming in for labs tomorrow, please place future orders. Thank you.

## 2018-02-18 NOTE — Progress Notes (Signed)
Order placed for labs.

## 2018-02-19 ENCOUNTER — Other Ambulatory Visit (INDEPENDENT_AMBULATORY_CARE_PROVIDER_SITE_OTHER): Payer: Medicare Other

## 2018-02-19 DIAGNOSIS — E119 Type 2 diabetes mellitus without complications: Secondary | ICD-10-CM

## 2018-02-19 DIAGNOSIS — I10 Essential (primary) hypertension: Secondary | ICD-10-CM | POA: Diagnosis not present

## 2018-02-19 DIAGNOSIS — E7800 Pure hypercholesterolemia, unspecified: Secondary | ICD-10-CM | POA: Diagnosis not present

## 2018-02-19 LAB — CBC WITH DIFFERENTIAL/PLATELET
BASOS PCT: 0.8 % (ref 0.0–3.0)
Basophils Absolute: 0 10*3/uL (ref 0.0–0.1)
EOS ABS: 0.1 10*3/uL (ref 0.0–0.7)
EOS PCT: 1.5 % (ref 0.0–5.0)
HEMATOCRIT: 41.2 % (ref 36.0–46.0)
HEMOGLOBIN: 13.4 g/dL (ref 12.0–15.0)
LYMPHS PCT: 15.8 % (ref 12.0–46.0)
Lymphs Abs: 0.7 10*3/uL (ref 0.7–4.0)
MCHC: 32.6 g/dL (ref 30.0–36.0)
MCV: 99.1 fl (ref 78.0–100.0)
MONOS PCT: 3.6 % (ref 3.0–12.0)
Monocytes Absolute: 0.2 10*3/uL (ref 0.1–1.0)
Neutro Abs: 3.6 10*3/uL (ref 1.4–7.7)
Neutrophils Relative %: 78.3 % — ABNORMAL HIGH (ref 43.0–77.0)
Platelets: 131 10*3/uL — ABNORMAL LOW (ref 150.0–400.0)
RBC: 4.16 Mil/uL (ref 3.87–5.11)
RDW: 20 % — ABNORMAL HIGH (ref 11.5–15.5)
WBC: 4.6 10*3/uL (ref 4.0–10.5)

## 2018-02-19 LAB — HEPATIC FUNCTION PANEL
ALT: 63 U/L — AB (ref 0–35)
AST: 60 U/L — AB (ref 0–37)
Albumin: 3.8 g/dL (ref 3.5–5.2)
Alkaline Phosphatase: 73 U/L (ref 39–117)
BILIRUBIN DIRECT: 0.1 mg/dL (ref 0.0–0.3)
TOTAL PROTEIN: 6.9 g/dL (ref 6.0–8.3)
Total Bilirubin: 0.7 mg/dL (ref 0.2–1.2)

## 2018-02-19 LAB — LIPID PANEL
CHOL/HDL RATIO: 4
Cholesterol: 138 mg/dL (ref 0–200)
HDL: 33.9 mg/dL — ABNORMAL LOW (ref >39.00)
LDL CALC: 71 mg/dL (ref 0–99)
NONHDL: 103.68
TRIGLYCERIDES: 161 mg/dL — AB (ref 0.0–149.0)
VLDL: 32.2 mg/dL (ref 0.0–40.0)

## 2018-02-19 LAB — HEMOGLOBIN A1C: Hgb A1c MFr Bld: 6.3 % (ref 4.6–6.5)

## 2018-02-19 LAB — BASIC METABOLIC PANEL
BUN: 19 mg/dL (ref 6–23)
CALCIUM: 10.1 mg/dL (ref 8.4–10.5)
CO2: 28 mEq/L (ref 19–32)
CREATININE: 1.05 mg/dL (ref 0.40–1.20)
Chloride: 104 mEq/L (ref 96–112)
GFR: 54.05 mL/min — ABNORMAL LOW (ref >60.00)
Glucose, Bld: 128 mg/dL — ABNORMAL HIGH (ref 70–99)
Potassium: 4.5 mEq/L (ref 3.5–5.1)
Sodium: 140 mEq/L (ref 135–145)

## 2018-02-23 ENCOUNTER — Ambulatory Visit (INDEPENDENT_AMBULATORY_CARE_PROVIDER_SITE_OTHER): Payer: Medicare Other | Admitting: Internal Medicine

## 2018-02-23 ENCOUNTER — Encounter: Payer: Self-pay | Admitting: Internal Medicine

## 2018-02-23 VITALS — BP 138/82 | HR 70 | Temp 97.4°F | Resp 18 | Wt 240.8 lb

## 2018-02-23 DIAGNOSIS — K2270 Barrett's esophagus without dysplasia: Secondary | ICD-10-CM

## 2018-02-23 DIAGNOSIS — D696 Thrombocytopenia, unspecified: Secondary | ICD-10-CM | POA: Diagnosis not present

## 2018-02-23 DIAGNOSIS — K2100 Gastro-esophageal reflux disease with esophagitis, without bleeding: Secondary | ICD-10-CM

## 2018-02-23 DIAGNOSIS — Z23 Encounter for immunization: Secondary | ICD-10-CM

## 2018-02-23 DIAGNOSIS — I890 Lymphedema, not elsewhere classified: Secondary | ICD-10-CM

## 2018-02-23 DIAGNOSIS — R945 Abnormal results of liver function studies: Secondary | ICD-10-CM | POA: Diagnosis not present

## 2018-02-23 DIAGNOSIS — N898 Other specified noninflammatory disorders of vagina: Secondary | ICD-10-CM

## 2018-02-23 DIAGNOSIS — Z9109 Other allergy status, other than to drugs and biological substances: Secondary | ICD-10-CM

## 2018-02-23 DIAGNOSIS — M069 Rheumatoid arthritis, unspecified: Secondary | ICD-10-CM

## 2018-02-23 DIAGNOSIS — E119 Type 2 diabetes mellitus without complications: Secondary | ICD-10-CM

## 2018-02-23 DIAGNOSIS — I10 Essential (primary) hypertension: Secondary | ICD-10-CM

## 2018-02-23 DIAGNOSIS — Z86010 Personal history of colonic polyps: Secondary | ICD-10-CM

## 2018-02-23 DIAGNOSIS — E7800 Pure hypercholesterolemia, unspecified: Secondary | ICD-10-CM

## 2018-02-23 DIAGNOSIS — E669 Obesity, unspecified: Secondary | ICD-10-CM

## 2018-02-23 DIAGNOSIS — R7989 Other specified abnormal findings of blood chemistry: Secondary | ICD-10-CM

## 2018-02-23 LAB — CBC WITH DIFFERENTIAL/PLATELET
Basophils Absolute: 0.1 10*3/uL (ref 0.0–0.1)
Basophils Relative: 1.1 % (ref 0.0–3.0)
EOS PCT: 1.4 % (ref 0.0–5.0)
Eosinophils Absolute: 0.1 10*3/uL (ref 0.0–0.7)
HCT: 42.1 % (ref 36.0–46.0)
Hemoglobin: 13.5 g/dL (ref 12.0–15.0)
LYMPHS ABS: 1.1 10*3/uL (ref 0.7–4.0)
Lymphocytes Relative: 18 % (ref 12.0–46.0)
MCHC: 32 g/dL (ref 30.0–36.0)
MCV: 100.1 fl — ABNORMAL HIGH (ref 78.0–100.0)
MONO ABS: 0.8 10*3/uL (ref 0.1–1.0)
Monocytes Relative: 13.6 % — ABNORMAL HIGH (ref 3.0–12.0)
NEUTROS PCT: 65.9 % (ref 43.0–77.0)
Neutro Abs: 3.9 10*3/uL (ref 1.4–7.7)
Platelets: 188 10*3/uL (ref 150.0–400.0)
RBC: 4.2 Mil/uL (ref 3.87–5.11)
RDW: 20.6 % — AB (ref 11.5–15.5)
WBC: 5.9 10*3/uL (ref 4.0–10.5)

## 2018-02-23 LAB — HEPATIC FUNCTION PANEL
ALT: 45 U/L — ABNORMAL HIGH (ref 0–35)
AST: 33 U/L (ref 0–37)
Albumin: 4.1 g/dL (ref 3.5–5.2)
Alkaline Phosphatase: 81 U/L (ref 39–117)
BILIRUBIN TOTAL: 0.5 mg/dL (ref 0.2–1.2)
Bilirubin, Direct: 0.1 mg/dL (ref 0.0–0.3)
Total Protein: 7.4 g/dL (ref 6.0–8.3)

## 2018-02-23 MED ORDER — NYSTATIN 100000 UNIT/GM EX CREA: TOPICAL_CREAM | CUTANEOUS | 0 refills | Status: DC

## 2018-02-23 NOTE — Progress Notes (Signed)
Patient ID: ELITA DAME, female   DOB: 1940-09-07, 77 y.o.   MRN: [id number]   Subjective:    Patient ID: OCEANIA NOORI, female    DOB: 1941/03/05, 77 y.o.   MRN: [id number]  HPI  Patient here for a scheduled follow up.  She reports she is doing relatively well.  Does report some vaginal irritation.  Some redness and burning.  Located around the vaginal area.  No vaginal discharge and not irritation intravaginally.  Has been using corn starch and vasaline.  No chest pain.  Breathing stable.  No acid reflux.  No abdominal pain.  Bowels moving.  Some right knee pain.  S/p injection previously.  Has f/u with Dr Lavonne Chick next week.  Discussed recent lab results.  a1c stable 6.3.  Elevated liver function tests.  Discussed diet and exercise.     Past Medical History:  Diagnosis Date  . Barrett's esophagus   . Diabetes mellitus ([place])   . Gout   . History of colon cancer    adenomatous polyps  . Hypercholesterolemia   . Hypertension   . Inflammatory arthritis   . Osteoarthritis    knees, spine  . Phlebitis    x2 (with pregnancy)  . 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 Pulmonary hypertension ([place])   . Reflux esophagitis   . Rheumatoid arthritis(714.0)    positive RF, FANA, RNP, negative CCP ab and anti DNA,.  neg anti-SCL 70  . 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 BREAST CYST ASPIRATION Left   . VEIN LIGATION AND STRIPPING     Family History  Problem Relation Age of Onset  . Arthritis Mother   . Heart attack Father   . Colon cancer Neg Hx   . Breast cancer Neg Hx    Social History   Socioeconomic History  . Marital status: Married    Spouse name: Not on file  . Number of children: Not on file  . Years of education: Not on file  . Highest education level: Not on file  Occupational History  . Not on file  Social Needs  . Financial resource strain: Not hard at all  . Food insecurity:    Worry: Never  true    Inability: Never true  . Transportation needs:    Medical: No    Non-medical: No  Tobacco Use  . Smoking status: Former Smoker    Packs/day: 0.50    Years: 15.00    Pack years: 7.50    Last attempt to quit: 05/12/1988    Years since quitting: 29.8  . Smokeless tobacco: Never Used  Substance and Sexual Activity  . Alcohol use: No    Alcohol/week: 0.0 standard drinks  . Drug use: No  . Sexual activity: Yes  Lifestyle  . Physical activity:    Days per week: Not on file    Minutes per session: Not on file  . Stress: Not on file  Relationships  . Social connections:    Talks on phone: Not on file    Gets together: Not on file    Attends religious service: Not on file    Active member of club or organization: Not on file    Attends meetings of clubs or organizations: Not on file    Relationship status: Not on file  Other Topics Concern  . Not on file  Social History Narrative  . Not on  file    Outpatient Encounter Medications as of 02/23/2018  Medication Sig  . allopurinol (ZYLOPRIM) 300 MG tablet TAKE 1 TABLET BY MOUTH ONCE DAILY  . amLODipine (NORVASC) 10 MG tablet TAKE 1 TABLET BY MOUTH ONCE DAILY  . aspirin 81 MG tablet Take 81 mg by mouth daily. Reported on 07/24/2015  . fluticasone (FLONASE) 50 MCG/ACT nasal spray Place 2 sprays into the nose daily.  . folic acid (FOLVITE) 1 MG tablet Take 1 mg by mouth daily.  Marland Kitchen glucose blood (BAYER CONTOUR TEST) test strip USE  STRIP TO CHECK GLUCOSE TWICE DAILY. Dx: E11.9  . losartan (COZAAR) 100 MG tablet TAKE 1 TABLET BY MOUTH ONCE DAILY  . metFORMIN (GLUCOPHAGE) 500 MG tablet Take 1 tablet (500 mg total) daily by mouth.  . methotrexate 2.5 MG tablet Take 6 tablets q week  . metoprolol succinate (TOPROL-XL) 50 MG 24 hr tablet TAKE 1 TABLET BY MOUTH ONCE DAILY IMMEDIATELY  FOLLOWING  A  MEAL  . MICROLET LANCETS MISC Check sugar once daily, [employer] Microlet Lancets. Dx E11.9  . naproxen (NAPROSYN) 500 MG tablet Take 500 mg  by mouth 2 (two) times daily as needed.  . nystatin cream (MYCOSTATIN) Apply to affected area bid  . omeprazole (PRILOSEC) 20 MG capsule Take 20 mg by mouth daily.  Marland Kitchen spironolactone (ALDACTONE) 25 MG tablet TAKE 1 TABLET BY MOUTH ONCE DAILY   No facility-administered encounter medications on file as of 02/23/2018.     Review of Systems  Constitutional: Negative for appetite change and unexpected weight change.  HENT: Negative for congestion and sinus pressure.   Respiratory: Negative for cough, chest tightness and shortness of breath.   Cardiovascular: Negative for chest pain and palpitations.  Gastrointestinal: Negative for abdominal pain, diarrhea, nausea and vomiting.  Genitourinary: Negative for difficulty urinating and dysuria.  Musculoskeletal: Negative for myalgias.       Knee pain as outlined.    Skin: Negative for color change and rash.  Neurological: Negative for dizziness, light-headedness and headaches.  Psychiatric/Behavioral: Negative for agitation and dysphoric mood.       Objective:     Blood pressure rechecked by me:  138/82  Physical Exam  Constitutional: She appears well-developed and well-nourished. No distress.  HENT:  Nose: Nose normal.  Mouth/Throat: Oropharynx is clear and moist.  Neck: Neck supple. No thyromegaly present.  Cardiovascular: Normal rate and regular rhythm.  Pulmonary/Chest: Breath sounds normal. No respiratory distress. She has no wheezes.  Abdominal: Soft. Bowel sounds are normal. There is no tenderness.  Genitourinary:  Genitourinary Comments: Erythema - perivaginal region.    Musculoskeletal: She exhibits no edema or tenderness.  Lymphadenopathy:    She has no cervical adenopathy.  Skin: No rash noted. No erythema.  Psychiatric: She has a normal mood and affect. Her behavior is normal.    BP 138/82   Pulse 70   Temp (!) 97.4 F (36.3 C) (Oral)   Resp 18   Wt 240 lb 12.8 oz (109.2 kg)   LMP 05/25/1988   SpO2 94%   BMI 34.55  kg/m  Wt Readings from Last 3 Encounters:  02/23/18 240 lb 12.8 oz (109.2 kg)  11/06/17 232 lb 6.4 oz (105.4 kg)  10/21/17 231 lb (104.8 kg)     Lab Results  Component Value Date   WBC 5.9 02/23/2018   HGB 13.5 02/23/2018   HCT 42.1 02/23/2018   PLT 188.0 02/23/2018   GLUCOSE 128 (H) 02/19/2018   CHOL 138 02/19/2018  TRIG 161.0 (H) 02/19/2018   HDL 33.90 (L) 02/19/2018   LDLCALC 71 02/19/2018   ALT 45 (H) 02/23/2018   AST 33 02/23/2018   NA 140 02/19/2018   K 4.5 02/19/2018   CL 104 02/19/2018   CREATININE 1.05 02/19/2018   BUN 19 02/19/2018   CO2 28 02/19/2018   TSH 4.29 11/25/2017   HGBA1C 6.3 02/19/2018   MICROALBUR 7.0 (H) 10/15/2017    Mm Screening Breast Tomo Bilateral  Result Date: 03/02/2017 CLINICAL DATA:  Screening. EXAM: 2D DIGITAL SCREENING BILATERAL MAMMOGRAM WITH CAD AND ADJUNCT TOMO COMPARISON:  Previous exam(s). ACR Breast Density Category b: There are scattered areas of fibroglandular density. FINDINGS: There are no findings suspicious for malignancy. Images were processed with CAD. IMPRESSION: No mammographic evidence of malignancy. A result letter of this screening mammogram will be mailed directly to the patient. RECOMMENDATION: Screening mammogram in one year. (Code:SM-B-01Y) BI-RADS CATEGORY  1: Negative. Electronically Signed   By: Nolon Nations M.D.   On: 03/02/2017 13:59       Assessment & Plan:   Problem List Items Addressed This Visit    Barrett's esophagus    Per my records, overdue f/u EGD.  Need to schedule f/u.       Diabetes mellitus ([place])    Low carb diet and exercise.  Follow met b and a1c.  Recent a1c stable 6.3.        Environmental allergies    Controlled.       GERD (gastroesophageal reflux disease)    Controlled on current regimen.        History of colon polyps    Last colonoscopy 05/2012.  Was due 05/2017.  Schedule f/u with GI if pt agreeable.        Hypercholesterolemia    Low cholesterol diet and exercise.   LDL 70s.  Follow lipid panel.        Hypertension    Blood pressure on recheck better.  Have her spot check her pressure.  Continue current regimen.  Follow pressures.  Follow metabolic panel.        Lymphedema    Has been evaluated by vascular surgery.  Compression hose.        Obesity (BMI 30-39.9)    Discussed diet and exercise.        Rheumatoid arthritis ([place])    Followed by Dr Jefm Bryant.  On MTX.  Recent liver panel with slightly elevated AST/ALT.  Recheck liver panel.         Other Visit Diagnoses    Abnormal liver function tests    -  Primary   On MTX.  Discussed diet and exercise.  Recheck liver function tests.     Relevant Orders   Hepatic function panel (Completed)   Thrombocytopenia (HCC)       Relevant Orders   CBC with Differential/Platelet (Completed)   Need for immunization against influenza       Encounter for immunization       Relevant Orders   Flu vaccine HIGH DOSE PF (Completed)   Vaginal irritation       Perivaginal irritation.  Nystatin cream.  Keep dry.         Einar Pheasant, MD

## 2018-02-24 ENCOUNTER — Other Ambulatory Visit: Payer: Self-pay | Admitting: Internal Medicine

## 2018-02-24 DIAGNOSIS — R7989 Other specified abnormal findings of blood chemistry: Secondary | ICD-10-CM

## 2018-02-24 DIAGNOSIS — R945 Abnormal results of liver function studies: Secondary | ICD-10-CM

## 2018-02-24 NOTE — Progress Notes (Signed)
Order placed for abdominal ultrasound.   

## 2018-02-28 ENCOUNTER — Encounter: Payer: Self-pay | Admitting: Internal Medicine

## 2018-02-28 ENCOUNTER — Telehealth: Payer: Self-pay | Admitting: Internal Medicine

## 2018-02-28 NOTE — Assessment & Plan Note (Signed)
Has been evaluated by vascular surgery.  Compression hose.

## 2018-02-28 NOTE — Assessment & Plan Note (Signed)
Controlled on current regimen.   

## 2018-02-28 NOTE — Assessment & Plan Note (Signed)
Per my records, overdue f/u EGD.  Need to schedule f/u.

## 2018-02-28 NOTE — Telephone Encounter (Signed)
My chart message sent to pt regarding need for GI referral for f/u colonoscopy and EGD.

## 2018-02-28 NOTE — Assessment & Plan Note (Signed)
Last colonoscopy 05/2012.  Was due 05/2017.  Schedule f/u with GI if pt agreeable.

## 2018-02-28 NOTE — Assessment & Plan Note (Signed)
Discussed diet and exercise 

## 2018-02-28 NOTE — Assessment & Plan Note (Signed)
Controlled.  

## 2018-02-28 NOTE — Assessment & Plan Note (Signed)
Low carb diet and exercise.  Follow met b and a1c.  Recent a1c stable 6.3.

## 2018-02-28 NOTE — Assessment & Plan Note (Signed)
Followed by Dr Jefm Bryant.  On MTX.  Recent liver panel with slightly elevated AST/ALT.  Recheck liver panel.

## 2018-02-28 NOTE — Assessment & Plan Note (Signed)
Blood pressure on recheck better.  Have her spot check her pressure.  Continue current regimen.  Follow pressures.  Follow metabolic panel.

## 2018-02-28 NOTE — Assessment & Plan Note (Signed)
Low cholesterol diet and exercise.  LDL 70s.  Follow lipid panel.

## 2018-03-03 ENCOUNTER — Ambulatory Visit
Admission: RE | Admit: 2018-03-03 | Discharge: 2018-03-03 | Disposition: A | Discharge status: Home / Self Care | Payer: Medicare Other | Source: Ambulatory Visit | Attending: Internal Medicine | Admitting: Internal Medicine

## 2018-03-03 DIAGNOSIS — Z1231 Encounter for screening mammogram for malignant neoplasm of breast: Secondary | ICD-10-CM | POA: Insufficient documentation

## 2018-03-03 HISTORY — DX: Malignant (primary) neoplasm, unspecified: C80.1

## 2018-03-04 DIAGNOSIS — I272 Pulmonary hypertension, unspecified: Secondary | ICD-10-CM | POA: Insufficient documentation

## 2018-03-04 DIAGNOSIS — M341 CR(E)ST syndrome: Secondary | ICD-10-CM | POA: Insufficient documentation

## 2018-03-15 ENCOUNTER — Telehealth: Payer: Self-pay | Admitting: Internal Medicine

## 2018-03-15 NOTE — Telephone Encounter (Signed)
Called pt and she said she would like to wait on ultrasound at this time because she has too much going on right now.

## 2018-03-15 NOTE — Telephone Encounter (Signed)
FYI, I called Nina Perez to get her scheduled for her US abdomen. Nina Perez stated that she doesnot want to have the US done.

## 2018-04-05 ENCOUNTER — Other Ambulatory Visit: Payer: Self-pay | Admitting: Internal Medicine

## 2018-04-20 NOTE — Telephone Encounter (Signed)
Pt calling and states she would like to schedule the [place] now. Please advise.

## 2018-04-20 NOTE — Telephone Encounter (Signed)
Please advise 

## 2018-04-21 NOTE — Telephone Encounter (Signed)
Called patient and advised that the order has been placed and gave her the number to call and schedule appt.

## 2018-04-26 ENCOUNTER — Ambulatory Visit
Admission: RE | Admit: 2018-04-26 | Discharge: 2018-04-26 | Disposition: A | Discharge status: Home / Self Care | Payer: Medicare Other | Source: Ambulatory Visit | Attending: Internal Medicine | Admitting: Internal Medicine

## 2018-04-26 ENCOUNTER — Other Ambulatory Visit: Payer: Self-pay | Admitting: Internal Medicine

## 2018-04-26 DIAGNOSIS — R7989 Other specified abnormal findings of blood chemistry: Secondary | ICD-10-CM

## 2018-04-26 DIAGNOSIS — R945 Abnormal results of liver function studies: Secondary | ICD-10-CM | POA: Insufficient documentation

## 2018-04-27 ENCOUNTER — Telehealth: Payer: Self-pay | Admitting: Internal Medicine

## 2018-04-27 NOTE — Telephone Encounter (Signed)
Patient calling to obtain lab results. Nurse triage currently unavailable.   Copied from [place] [id number]. Topic: Quick Communication - Lab Results (Clinic Use ONLY) >> Apr 27, 2018  1:41 PM Lars Masson, LPN wrote: Called patient to inform them of ultrasound results. When patient returns call, triage nurse may disclose results.

## 2018-05-24 ENCOUNTER — Other Ambulatory Visit: Payer: Self-pay | Admitting: Internal Medicine

## 2018-06-01 ENCOUNTER — Other Ambulatory Visit: Payer: Self-pay | Admitting: Internal Medicine

## 2018-06-16 ENCOUNTER — Ambulatory Visit (INDEPENDENT_AMBULATORY_CARE_PROVIDER_SITE_OTHER): Payer: Medicare Other

## 2018-06-16 ENCOUNTER — Ambulatory Visit (INDEPENDENT_AMBULATORY_CARE_PROVIDER_SITE_OTHER): Payer: Medicare Other | Admitting: Family

## 2018-06-16 ENCOUNTER — Ambulatory Visit
Admission: RE | Admit: 2018-06-16 | Discharge: 2018-06-16 | Disposition: A | Discharge status: Home / Self Care | Payer: Medicare Other | Source: Ambulatory Visit | Attending: Family | Admitting: Family

## 2018-06-16 ENCOUNTER — Encounter: Payer: Self-pay | Admitting: Family

## 2018-06-16 ENCOUNTER — Other Ambulatory Visit: Payer: Self-pay | Admitting: Family

## 2018-06-16 VITALS — BP 168/88 | HR 73 | Temp 98.0°F | Resp 15 | Ht 66.0 in | Wt 242.5 lb

## 2018-06-16 DIAGNOSIS — I890 Lymphedema, not elsewhere classified: Secondary | ICD-10-CM | POA: Insufficient documentation

## 2018-06-16 DIAGNOSIS — M546 Pain in thoracic spine: Secondary | ICD-10-CM | POA: Diagnosis not present

## 2018-06-16 DIAGNOSIS — R918 Other nonspecific abnormal finding of lung field: Secondary | ICD-10-CM

## 2018-06-16 MED ORDER — LIDOCAINE 5 % EX PTCH: 1.0000 | MEDICATED_PATCH | CUTANEOUS | 0 refills | Status: DC

## 2018-06-16 MED ORDER — DICLOFENAC SODIUM 1 % TD GEL: 4.0000 g | Freq: Four times a day (QID) | TRANSDERMAL | 3 refills | Status: DC

## 2018-06-16 NOTE — Assessment & Plan Note (Signed)
Based on exam, particularly as exacerbated with movement, suspect musculoskeletal etiology.  Discussed topical therapy with patient, pending x-ray.  If no improvement will consult orthopedics.

## 2018-06-16 NOTE — Progress Notes (Signed)
Subjective:    Patient ID: Nina Perez, female    DOB: [birth year], 78 y.o.   MRN: [id number]  CC: Nina Perez is a 78 y.o. female who presents today for an acute visit.    HPI: CC:  Right side flank for past 6 weeks, unchanged.  Bending , certain movements. Stabbing pain.  If still, no pain. Has taken naprosyn without much relief. Thought may be shingles. H/o of left abdomen.  No nausea, vomiting, fever, chills, dysuria, hematuria, abdominal pain.   Feels like hands and legs are swollen 'forever' , 6 months, unchanged.  Feet and hands may be swollen in the morning, however worse after standing.  Wears compression stockings.   Has seen Dr Aleda Grana ( vascular) approx one year ago.  History of lymphedema  No h/o HF.   Wears PRN O2 for dyspnea. No increase SOB today. No orthopnea.   HTN- hasnt taken blood pressure medications today. No CP.   H/o dvt.   Accompanied by husband.  echo 2019- 4. LV Ejection fraction 55%.     History of rheumatoid arthritis, follows with Dr. Jefm Bryant. Ultrasound abdomen December 16,019.  No gallstones.  Mild hepatic echogenicity. HISTORY:  Past Medical History:  Diagnosis Date  . Barrett's esophagus   . Cancer ([place])    skin ca  . Diabetes mellitus ([place])   . Gout   . History of colon cancer    adenomatous polyps  . Hypercholesterolemia   . Hypertension   . Inflammatory arthritis   . Osteoarthritis    knees, spine  . Phlebitis    x2 (with pregnancy)  . 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 Pulmonary hypertension ([place])   . Reflux esophagitis   . Rheumatoid arthritis(714.0)    positive RF, FANA, RNP, negative CCP ab and anti DNA,.  neg anti-SCL 70  . 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 BREAST CYST ASPIRATION Left    neg  . VEIN LIGATION AND STRIPPING     Family History  Problem Relation Age of Onset  . Arthritis Mother   . Heart attack Father     . Colon cancer Neg Hx   . Breast cancer Neg Hx     Allergies: Maxzide [[id number]] Current Outpatient Medications on File Prior to Visit  Medication Sig Dispense Refill  . allopurinol (ZYLOPRIM) 300 MG tablet TAKE 1 TABLET BY MOUTH ONCE DAILY 90 tablet 0  . amLODipine (NORVASC) 10 MG tablet TAKE 1 TABLET BY MOUTH ONCE DAILY 90 tablet 3  . aspirin 81 MG tablet Take 81 mg by mouth daily. Reported on 6/75/9163    . folic acid (FOLVITE) 1 MG tablet Take 1 mg by mouth daily.    Marland Kitchen glucose blood (BAYER CONTOUR TEST) test strip USE  STRIP TO CHECK GLUCOSE TWICE DAILY. Dx: E11.9 100 each 12  . losartan (COZAAR) 100 MG tablet TAKE 1 TABLET BY MOUTH ONCE DAILY 90 tablet 1  . metFORMIN (GLUCOPHAGE) 500 MG tablet TAKE 1 TABLET BY MOUTH ONCE DAILY 90 tablet 0  . methotrexate 2.5 MG tablet Take 6 tablets q week    . metoprolol succinate (TOPROL-XL) 50 MG 24 hr tablet TAKE 1 TABLET BY MOUTH ONCE DAILY IMMEDIATELY  FOLLOWING  A  MEAL. 90 tablet 1  . MICROLET LANCETS MISC Check sugar once daily, [employer] Microlet Lancets. Dx E11.9 100 each 12  . naproxen (NAPROSYN) 500 MG tablet Take  500 mg by mouth 2 (two) times daily as needed.    Marland Kitchen omeprazole (PRILOSEC) 20 MG capsule Take 20 mg by mouth daily.    Marland Kitchen spironolactone (ALDACTONE) 25 MG tablet TAKE 1 TABLET BY MOUTH ONCE DAILY 90 tablet 0  . fluticasone (FLONASE) 50 MCG/ACT nasal spray Place 2 sprays into the nose daily. (Patient not taking: Reported on 06/16/2018) 16 g 1  . nystatin cream (MYCOSTATIN) Apply to affected area bid (Patient not taking: Reported on 06/16/2018) 60 g 0   No current facility-administered medications on file prior to visit.     Social History   Tobacco Use  . Smoking status: Former Smoker    Packs/day: 0.50    Years: 15.00    Pack years: 7.50    Last attempt to quit: 05/12/1988    Years since quitting: 30.1  . Smokeless tobacco: Never Used  Substance Use Topics  . Alcohol use: No    Alcohol/week: 0.0 standard drinks  .  Drug use: No    Review of Systems  Constitutional: Negative for chills and fever.  HENT: Negative for congestion.   Respiratory: Positive for shortness of breath (at baseline). Negative for cough and wheezing.   Cardiovascular: Positive for leg swelling. Negative for chest pain and palpitations.  Gastrointestinal: Negative for nausea and vomiting.  Musculoskeletal: Positive for back pain.  Neurological: Negative for numbness.      Objective:    BP (!) 168/88   Pulse 73   Temp 98 F (36.7 C) (Oral)   Resp 15   Ht 5\' 6"  (1.676 m)   Wt 242 lb 8 oz (110 kg)   LMP 05/25/1988   SpO2 95%   BMI 39.14 kg/m    Physical Exam Vitals signs reviewed.  Constitutional:      Appearance: She is well-developed.  Eyes:     Conjunctiva/sclera: Conjunctivae normal.  Cardiovascular:     Rate and Rhythm: Normal rate and regular rhythm.     Pulses: Normal pulses.     Heart sounds: Normal heart sounds.     Comments: BLE +1 non pitting edema No  palpable cords or masses. No erythema or increased warmth. No asymmetry in calf size when compared bilaterally however both calves appear taut.  Bilateral red discoloration, varicosities noted.  LE warm and palpable pedal pulses.  Pulmonary:     Effort: Pulmonary effort is normal.     Breath sounds: Normal breath sounds. No wheezing, rhonchi or rales.  Musculoskeletal:     Thoracic back: She exhibits pain. She exhibits normal range of motion, no tenderness, no bony tenderness, no swelling and no spasm.       Arms:     Right lower leg: Edema present.     Left lower leg: Edema present.     Comments: Focal area right-sided thoracic back pain.  Nontender on exam.  No vesicular lesions or rash appreciated.  Pain reproduced with lateral side bends.  Skin:    General: Skin is warm and dry.  Neurological:     Mental Status: She is alert.  Psychiatric:        Speech: Speech normal.        Behavior: Behavior normal.        Thought Content: Thought  content normal.        Assessment & Plan:   Problem List Items Addressed This Visit      Other   Lymphedema    Chronic.  No h/o CHF. Pending stat ultrasound  to ensure no DVT.  Otherwise, patient felt comfortable calling her vascular doctor, Dr. Renaldo Reel for follow-up.  Given her the number today.  She declines a referral since she has had them in the past year.      Relevant Orders   US Venous Img Lower Bilateral   Acute right-sided thoracic back pain - Primary    Based on exam, particularly as exacerbated with movement, suspect musculoskeletal etiology.  Discussed topical therapy with patient, pending x-ray.  If no improvement will consult orthopedics.      Relevant Medications   diclofenac sodium (VOLTAREN) 1 % GEL   lidocaine (LIDODERM) 5 %   Other Relevant Orders   DG Thoracic Spine W/Swimmers        I am having Nina Perez start on diclofenac sodium and lidocaine. I am also having her maintain her omeprazole, aspirin, naproxen, folic acid, methotrexate, fluticasone, MICROLET LANCETS, glucose blood, amLODipine, losartan, nystatin cream, metoprolol succinate, metFORMIN, spironolactone, and allopurinol.   Meds ordered this encounter  Medications  . diclofenac sodium (VOLTAREN) 1 % GEL    Sig: Apply 4 g topically 4 (four) times daily.    Dispense:  1 Tube    Refill:  3    Order Specific Question:   Supervising Provider    Answer:   Derrel Nip, TERESA L [[id number]]  . lidocaine (LIDODERM) 5 %    Sig: Place 1 patch onto the skin daily. Remove & Discard patch within 12 hours.    Dispense:  30 patch    Refill:  0    Order Specific Question:   Supervising Provider    Answer:   Crecencio Mc [[id number]]    Return precautions given.   Risks, benefits, and alternatives of the medications and treatment plan prescribed today were discussed, and patient expressed understanding.   Education regarding symptom management and diagnosis given to patient on AVS.  Continue to follow  with Einar Pheasant, MD for routine health maintenance.   Nina Perez and I agreed with plan.   Mable Paris, FNP

## 2018-06-16 NOTE — Patient Instructions (Addendum)
Please take blood pressure  Medications as SOON as you get home  Call [phone] to schedule with Dr Renaldo Reel as your preference today; certainly start let me know of any problems in scheduling.  Ultrasound today of the legs to ensure no blood clot.  As long as negative, please ensure you are elevating your legs, wearing compression stockings, following a low-salt diet.  I suspect musculoskeletal right-sided back pain.e may try topical therapies, including HEAT,  for this may take naproxen as needed.  Pending x-ray.    If no improvement conservative therapies, we will consult orthopedics.  Let me know how you are doing.

## 2018-06-16 NOTE — Assessment & Plan Note (Signed)
Chronic.  No h/o CHF. Pending stat ultrasound to ensure no DVT.  Otherwise, patient felt comfortable calling her vascular doctor, Dr. Renaldo Reel for follow-up.  Given her the number today.  She declines a referral since she has had them in the past year.

## 2018-06-17 ENCOUNTER — Ambulatory Visit (INDEPENDENT_AMBULATORY_CARE_PROVIDER_SITE_OTHER): Payer: Medicare Other

## 2018-06-17 ENCOUNTER — Telehealth: Payer: Self-pay | Admitting: Internal Medicine

## 2018-06-17 DIAGNOSIS — R918 Other nonspecific abnormal finding of lung field: Secondary | ICD-10-CM | POA: Diagnosis not present

## 2018-06-17 NOTE — Telephone Encounter (Signed)
Copied from [place] [phone]. Topic: Quick Communication - See Telephone Encounter >> Jun 17, 2018  4:20 PM Bea Graff, NT wrote: CRM for notification. See Telephone encounter for: 06/17/18. Pts husband calling to receive wifes x-ray results.

## 2018-06-17 NOTE — Telephone Encounter (Signed)
I called & spoke with patient. Her husband is taking her to ED for evaluation. Unfortunately I could not give results since they has not yet been sent back to me.

## 2018-06-18 NOTE — Telephone Encounter (Signed)
Reviewed attached message.  Nina Perez saw [name].  Initial back xray - recommended cxr.  cxr yesterday - ok.  Please call pt and confirm doing ok.

## 2018-06-18 NOTE — Telephone Encounter (Signed)
Spoke with pt, she is feeling much better today. She does not plan to f/u with Dr. Jefm Bryant, she will just f/u with PCP as scheduled. She did take Naproxen last night, she keeps that on hand for arthritis. She declines referral to ortho at this time. You thanks you for calling to follow-up with her.   Forwarding to Mable Paris, FNP as Juluis Rainier.

## 2018-06-18 NOTE — Telephone Encounter (Signed)
Call patient from home in regards to husband called our office.  She is currently experiencing same right-sided back pain.  Describes pain as 'catching' with movement.  Pain resolves when she is still.  Currently using heating pad, topical gel with little relief.  Pain is not affected by meals.  No cp, sob, epigastric burning.   Discussed CXR results.  No infiltrate. Appears left lung scarring.    Advised at this point it does sound to be musculoskeletal, she felt more comfortable contacting Dr. Jefm Bryant rheumatology herself in regards to potential next steps.   Offered orthopedic referral, she plan declined at this time.  She will let me know how she is doing.  FYI scott, she is seeing you soon 06/30/18 for follow up.

## 2018-06-18 NOTE — Telephone Encounter (Signed)
noted 

## 2018-06-18 NOTE — Telephone Encounter (Signed)
I spoke with patient last night in regards to chest x-ray. I wanted to follow-up in regards to how she is feeling today. What pain medication ( she mentioned one last night) has she been taking?    Please reemphasize to give [place] a call and let us know when she has spoken with Dr. Jefm Bryant.    Again, if she would like an orthopedics referral, let us know.

## 2018-06-18 NOTE — Telephone Encounter (Signed)
Patient is doing okay but would like to come in for labs prior to her appt on Wednesday. She had fasting labs done 02/23/18, I wasn't sure which labs you wanted ordered.

## 2018-06-21 ENCOUNTER — Other Ambulatory Visit: Payer: Self-pay | Admitting: Internal Medicine

## 2018-06-21 DIAGNOSIS — M79605 Pain in left leg: Secondary | ICD-10-CM

## 2018-06-21 DIAGNOSIS — M79604 Pain in right leg: Secondary | ICD-10-CM

## 2018-06-21 DIAGNOSIS — E7800 Pure hypercholesterolemia, unspecified: Secondary | ICD-10-CM

## 2018-06-21 DIAGNOSIS — D7589 Other specified diseases of blood and blood-forming organs: Secondary | ICD-10-CM

## 2018-06-21 DIAGNOSIS — I10 Essential (primary) hypertension: Secondary | ICD-10-CM

## 2018-06-21 DIAGNOSIS — E119 Type 2 diabetes mellitus without complications: Secondary | ICD-10-CM

## 2018-06-21 NOTE — Telephone Encounter (Signed)
Left message for patient to call back and schedule lab appt.

## 2018-06-21 NOTE — Progress Notes (Signed)
Order placed for labs.

## 2018-06-21 NOTE — Telephone Encounter (Signed)
Lab scheduled °

## 2018-06-21 NOTE — Telephone Encounter (Signed)
Order placed for labs.  Please schedule an appt.

## 2018-06-22 ENCOUNTER — Other Ambulatory Visit (INDEPENDENT_AMBULATORY_CARE_PROVIDER_SITE_OTHER): Payer: Medicare Other

## 2018-06-22 DIAGNOSIS — E119 Type 2 diabetes mellitus without complications: Secondary | ICD-10-CM

## 2018-06-22 DIAGNOSIS — I10 Essential (primary) hypertension: Secondary | ICD-10-CM | POA: Diagnosis not present

## 2018-06-22 DIAGNOSIS — E7800 Pure hypercholesterolemia, unspecified: Secondary | ICD-10-CM

## 2018-06-22 LAB — CBC WITH DIFFERENTIAL/PLATELET
BASOS ABS: 0.1 10*3/uL (ref 0.0–0.1)
Basophils Relative: 1.9 % (ref 0.0–3.0)
Eosinophils Absolute: 0.1 10*3/uL (ref 0.0–0.7)
Eosinophils Relative: 1.8 % (ref 0.0–5.0)
HCT: 41.6 % (ref 36.0–46.0)
HEMOGLOBIN: 13.2 g/dL (ref 12.0–15.0)
Lymphocytes Relative: 18.5 % (ref 12.0–46.0)
Lymphs Abs: 0.9 10*3/uL (ref 0.7–4.0)
MCHC: 31.8 g/dL (ref 30.0–36.0)
MCV: 99.3 fl (ref 78.0–100.0)
Monocytes Absolute: 0.4 10*3/uL (ref 0.1–1.0)
Monocytes Relative: 9.1 % (ref 3.0–12.0)
Neutro Abs: 3.3 10*3/uL (ref 1.4–7.7)
Neutrophils Relative %: 68.7 % (ref 43.0–77.0)
Platelets: 196 10*3/uL (ref 150.0–400.0)
RBC: 4.19 Mil/uL (ref 3.87–5.11)
RDW: 19.4 % — ABNORMAL HIGH (ref 11.5–15.5)
WBC: 4.7 10*3/uL (ref 4.0–10.5)

## 2018-06-22 LAB — HEPATIC FUNCTION PANEL
ALBUMIN: 3.7 g/dL (ref 3.5–5.2)
ALT: 36 U/L — ABNORMAL HIGH (ref 0–35)
AST: 36 U/L (ref 0–37)
Alkaline Phosphatase: 77 U/L (ref 39–117)
Bilirubin, Direct: 0.1 mg/dL (ref 0.0–0.3)
Total Bilirubin: 0.7 mg/dL (ref 0.2–1.2)
Total Protein: 6.6 g/dL (ref 6.0–8.3)

## 2018-06-22 LAB — BASIC METABOLIC PANEL
BUN: 18 mg/dL (ref 6–23)
CHLORIDE: 105 meq/L (ref 96–112)
CO2: 25 mEq/L (ref 19–32)
Calcium: 9.7 mg/dL (ref 8.4–10.5)
Creatinine, Ser: 0.91 mg/dL (ref 0.40–1.20)
GFR: 59.93 mL/min — ABNORMAL LOW (ref >60.00)
Glucose, Bld: 97 mg/dL (ref 70–99)
Potassium: 4.6 mEq/L (ref 3.5–5.1)
Sodium: 139 mEq/L (ref 135–145)

## 2018-06-22 LAB — LIPID PANEL
Cholesterol: 132 mg/dL (ref 0–200)
HDL: 34.2 mg/dL — ABNORMAL LOW (ref >39.00)
LDL Cholesterol: 72 mg/dL (ref 0–99)
NONHDL: 97.63
Total CHOL/HDL Ratio: 4
Triglycerides: 127 mg/dL (ref 0.0–149.0)
VLDL: 25.4 mg/dL (ref 0.0–40.0)

## 2018-06-22 LAB — HEMOGLOBIN A1C: Hgb A1c MFr Bld: 6.1 % (ref 4.6–6.5)

## 2018-06-22 LAB — TSH: TSH: 4.6 u[IU]/mL — AB (ref 0.35–4.50)

## 2018-06-23 ENCOUNTER — Encounter: Payer: Self-pay | Admitting: Internal Medicine

## 2018-06-30 ENCOUNTER — Ambulatory Visit (INDEPENDENT_AMBULATORY_CARE_PROVIDER_SITE_OTHER): Payer: Medicare Other | Admitting: Internal Medicine

## 2018-06-30 ENCOUNTER — Encounter: Payer: Self-pay | Admitting: Internal Medicine

## 2018-06-30 VITALS — BP 140/80 | HR 81 | Temp 97.9°F | Wt 242.6 lb

## 2018-06-30 DIAGNOSIS — E119 Type 2 diabetes mellitus without complications: Secondary | ICD-10-CM | POA: Diagnosis not present

## 2018-06-30 DIAGNOSIS — M25561 Pain in right knee: Secondary | ICD-10-CM

## 2018-06-30 DIAGNOSIS — Z0000 Encounter for general adult medical examination without abnormal findings: Secondary | ICD-10-CM

## 2018-06-30 DIAGNOSIS — D696 Thrombocytopenia, unspecified: Secondary | ICD-10-CM

## 2018-06-30 DIAGNOSIS — K2270 Barrett's esophagus without dysplasia: Secondary | ICD-10-CM

## 2018-06-30 DIAGNOSIS — M069 Rheumatoid arthritis, unspecified: Secondary | ICD-10-CM

## 2018-06-30 DIAGNOSIS — I890 Lymphedema, not elsewhere classified: Secondary | ICD-10-CM

## 2018-06-30 DIAGNOSIS — M546 Pain in thoracic spine: Secondary | ICD-10-CM

## 2018-06-30 DIAGNOSIS — E669 Obesity, unspecified: Secondary | ICD-10-CM

## 2018-06-30 DIAGNOSIS — I10 Essential (primary) hypertension: Secondary | ICD-10-CM

## 2018-06-30 DIAGNOSIS — K2100 Gastro-esophageal reflux disease with esophagitis, without bleeding: Secondary | ICD-10-CM

## 2018-06-30 DIAGNOSIS — E7800 Pure hypercholesterolemia, unspecified: Secondary | ICD-10-CM

## 2018-06-30 NOTE — Progress Notes (Signed)
Patient ID: Nina Perez, female   DOB: 1941-01-14, 78 y.o.   MRN: [id number]   Subjective:    Patient ID: Nina Perez, female    DOB: January 05, 1941, 78 y.o.   MRN: [id number]  HPI  Patient with past history of diabetes, barretts esophagus, hypertension, RA and hypercholesterolemia.  She comes in to follow up on these issues as well as for a complete physical exam.  She reports not feeling as well lately.  Has noticed increased swelling in her hands, feet and legs.  Wearing compression hose.  Some pain.  Has seen Dr Aleda Grana previously.  Request referral back.  No chest pain.  Breathing stable.  No acid reflux.  Had recent abdominal ultrasound. Noticed pain - right side/ribs - two days after ultrasound.  Bowels moving.  Does report increased knee pain.  S/p injection in October.  States did a lot of walking after injection and feels did not help as much as usual.  Request earlier appt with Dr Jefm Bryant.  Has labs scheduled next week.  Eating.  No fever.     Past Medical History:  Diagnosis Date  . Barrett's esophagus   . Cancer ([place])    skin ca  . Diabetes mellitus ([place])   . Gout   . History of colon cancer    adenomatous polyps  . Hypercholesterolemia   . Hypertension   . Inflammatory arthritis   . Osteoarthritis    knees, spine  . Phlebitis    x2 (with pregnancy)  . 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 Pulmonary hypertension ([place])   . Reflux esophagitis   . Rheumatoid arthritis(714.0)    positive RF, FANA, RNP, negative CCP ab and anti DNA,.  neg anti-SCL 70  . 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 BREAST CYST ASPIRATION Left    neg  . VEIN LIGATION AND STRIPPING     Family History  Problem Relation Age of Onset  . Arthritis Mother   . Heart attack Father   . Colon cancer Neg Hx   . Breast cancer Neg Hx    Social History   Socioeconomic History  . Marital status: Married    Spouse name:  Not on file  . Number of children: Not on file  . Years of education: Not on file  . Highest education level: Not on file  Occupational History  . Not on file  Social Needs  . Financial resource strain: Not hard at all  . Food insecurity:    Worry: Never true    Inability: Never true  . Transportation needs:    Medical: No    Non-medical: No  Tobacco Use  . Smoking status: Former Smoker    Packs/day: 0.50    Years: 15.00    Pack years: 7.50    Last attempt to quit: 05/12/1988    Years since quitting: 30.1  . Smokeless tobacco: Never Used  Substance and Sexual Activity  . Alcohol use: No    Alcohol/week: 0.0 standard drinks  . Drug use: No  . Sexual activity: Yes  Lifestyle  . Physical activity:    Days per week: Not on file    Minutes per session: Not on file  . Stress: Not on file  Relationships  . Social connections:    Talks on phone: Not on file    Gets together: Not on file    Attends religious service:  Not on file    Active member of club or organization: Not on file    Attends meetings of clubs or organizations: Not on file    Relationship status: Not on file  Other Topics Concern  . Not on file  Social History Narrative  . Not on file    Outpatient Encounter Medications as of 06/30/2018  Medication Sig  . allopurinol (ZYLOPRIM) 300 MG tablet TAKE 1 TABLET BY MOUTH ONCE DAILY  . amLODipine (NORVASC) 10 MG tablet TAKE 1 TABLET BY MOUTH ONCE DAILY  . aspirin 81 MG tablet Take 81 mg by mouth daily. Reported on 07/24/2015  . diclofenac sodium (VOLTAREN) 1 % GEL Apply 4 g topically 4 (four) times daily.  . fluticasone (FLONASE) 50 MCG/ACT nasal spray Place 2 sprays into the nose daily.  . folic acid (FOLVITE) 1 MG tablet Take 1 mg by mouth daily.  Marland Kitchen glucose blood (BAYER CONTOUR TEST) test strip USE  STRIP TO CHECK GLUCOSE TWICE DAILY. Dx: E11.9  . lidocaine (LIDODERM) 5 % Place 1 patch onto the skin daily. Remove & Discard patch within 12 hours.  Marland Kitchen losartan  (COZAAR) 100 MG tablet TAKE 1 TABLET BY MOUTH ONCE DAILY  . metFORMIN (GLUCOPHAGE) 500 MG tablet TAKE 1 TABLET BY MOUTH ONCE DAILY  . methotrexate 2.5 MG tablet Take 6 tablets q week  . metoprolol succinate (TOPROL-XL) 50 MG 24 hr tablet TAKE 1 TABLET BY MOUTH ONCE DAILY IMMEDIATELY  FOLLOWING  A  MEAL.  Marland Kitchen MICROLET LANCETS MISC Check sugar once daily, [employer] Microlet Lancets. Dx E11.9  . naproxen (NAPROSYN) 500 MG tablet Take 500 mg by mouth 2 (two) times daily as needed.  . nystatin cream (MYCOSTATIN) Apply to affected area bid  . omeprazole (PRILOSEC) 20 MG capsule Take 20 mg by mouth daily.  Marland Kitchen spironolactone (ALDACTONE) 25 MG tablet TAKE 1 TABLET BY MOUTH ONCE DAILY   No facility-administered encounter medications on file as of 06/30/2018.     Review of Systems  Constitutional: Negative for appetite change and unexpected weight change.  HENT: Negative for congestion and sinus pressure.   Eyes: Negative for pain and visual disturbance.  Respiratory: Negative for cough, chest tightness and shortness of breath.   Cardiovascular: Positive for leg swelling. Negative for chest pain and palpitations.  Gastrointestinal: Negative for abdominal pain, diarrhea, nausea and vomiting.       Right side pain - rib pain.    Genitourinary: Negative for difficulty urinating and dysuria.  Musculoskeletal:       Knee pain as outlined.  Swelling of hands, feet and legs.   Skin: Negative for color change and rash.  Neurological: Negative for dizziness, light-headedness and headaches.  Hematological: Negative for adenopathy. Does not bruise/bleed easily.  Psychiatric/Behavioral: Negative for agitation and dysphoric mood.       Objective:    Physical Exam Constitutional:      General: She is not in acute distress.    Appearance: Normal appearance.  HENT:     Nose: Nose normal. No congestion.     Mouth/Throat:     Pharynx: No oropharyngeal exudate or posterior oropharyngeal erythema.  Neck:      Musculoskeletal: Neck supple. No muscular tenderness.     Thyroid: No thyromegaly.  Cardiovascular:     Rate and Rhythm: Normal rate and regular rhythm.  Pulmonary:     Effort: No respiratory distress.     Breath sounds: Normal breath sounds. No wheezing.  Abdominal:     General:  Bowel sounds are normal.     Palpations: Abdomen is soft.     Tenderness: There is no abdominal tenderness.  Musculoskeletal:        General: No tenderness.     Comments: Pedal and ankle/lower extremity swelling.  Some stasis changes.  No evidence of cellulitis.    Lymphadenopathy:     Cervical: No cervical adenopathy.  Skin:    Findings: No rash.  Neurological:     Mental Status: She is alert.  Psychiatric:        Mood and Affect: Mood normal.        Behavior: Behavior normal.     BP 140/80   Pulse 81   Temp 97.9 F (36.6 C) (Oral)   Wt 242 lb 9.6 oz (110 kg)   LMP 05/25/1988   SpO2 95%   BMI 39.16 kg/m  Wt Readings from Last 3 Encounters:  06/30/18 242 lb 9.6 oz (110 kg)  06/16/18 242 lb 8 oz (110 kg)  02/23/18 240 lb 12.8 oz (109.2 kg)     Lab Results  Component Value Date   WBC 4.7 06/22/2018   HGB 13.2 06/22/2018   HCT 41.6 06/22/2018   PLT 196.0 06/22/2018   GLUCOSE 97 06/22/2018   CHOL 132 06/22/2018   TRIG 127.0 06/22/2018   HDL 34.20 (L) 06/22/2018   LDLCALC 72 06/22/2018   ALT 36 (H) 06/22/2018   AST 36 06/22/2018   NA 139 06/22/2018   K 4.6 06/22/2018   CL 105 06/22/2018   CREATININE 0.91 06/22/2018   BUN 18 06/22/2018   CO2 25 06/22/2018   TSH 4.60 (H) 06/22/2018   HGBA1C 6.1 06/22/2018   MICROALBUR 7.0 (H) 10/15/2017    Dg Chest 2 View  Result Date: 06/17/2018 CLINICAL DATA:  Followup for left infiltrate noted on thoracic spine radiographs. EXAM: CHEST - 2 VIEW COMPARISON:  Thoracic spine radiographs, 06/16/2018. Chest CT, 08/26/2010. FINDINGS: Cardiac silhouette mildly enlarged. No mediastinal or hilar masses. There is no evidence of adenopathy. Mild linear  opacity at the left lateral lung base. Mildly prominent bronchovascular markings. Lungs otherwise clear. Specifically, there is no left lung base consolidation. No pleural effusion or pneumothorax. Skeletal structures are demineralized but grossly intact. IMPRESSION: 1. No active cardiopulmonary disease. 2. No evidence of left lung base pneumonia. Electronically Signed   By: Lajean Manes M.D.   On: 06/17/2018 12:27   Dg Thoracic Spine W/swimmers  Result Date: 06/16/2018 CLINICAL DATA:  Right-sided thoracic back pain. EXAM: THORACIC SPINE - 3 VIEWS COMPARISON:  Chest CT 08/26/2010 FINDINGS: Mild S-shaped thoracic scoliosis is again seen. No significant listhesis is identified in the sagittal plane. No fracture is identified. There is mild degenerative endplate spurring in the lower thoracic spine with more notable spurring in the included portion of the lumbar spine. The bones are diffusely osteopenic. There is the suggestion of patchy airspace opacity in the basilar left lower lobe. IMPRESSION: 1. Thoracic scoliosis without acute osseous abnormality. 2. Possible left lower lobe pulmonary infiltrate. Correlate for respiratory symptoms and consider further evaluation with dedicated PA and lateral chest radiographs. Electronically Signed   By: Logan Bores M.D.   On: 06/16/2018 13:53   US Venous Img Lower Bilateral  Result Date: 06/16/2018 CLINICAL DATA:  Bilateral lower extremity pain and edema. History greater saphenous vein harvesting. Evaluate for DVT. EXAM: BILATERAL LOWER EXTREMITY VENOUS DOPPLER ULTRASOUND TECHNIQUE: Gray-scale sonography with graded compression, as well as color Doppler and duplex ultrasound were performed to evaluate the  lower extremity deep venous systems from the level of the common femoral vein and including the common femoral, femoral, profunda femoral, popliteal and calf veins including the posterior tibial, peroneal and gastrocnemius veins when visible. The superficial great  saphenous vein was also interrogated. Spectral Doppler was utilized to evaluate flow at rest and with distal augmentation maneuvers in the common femoral, femoral and popliteal veins. COMPARISON:  None. FINDINGS: RIGHT LOWER EXTREMITY Common Femoral Vein: No evidence of thrombus. Normal compressibility, respiratory phasicity and response to augmentation. Saphenofemoral Junction: Surgically absent. Profunda Femoral Vein: No evidence of thrombus. Normal compressibility and flow on color Doppler imaging. Femoral Vein: No evidence of thrombus. Normal compressibility, respiratory phasicity and response to augmentation. Popliteal Vein: No evidence of thrombus. Normal compressibility, respiratory phasicity and response to augmentation. Calf Veins: No evidence of thrombus. Normal compressibility and flow on color Doppler imaging. Superficial Great Saphenous Vein: Surgically absent. Venous Reflux:  None. Other Findings:  None. LEFT LOWER EXTREMITY Common Femoral Vein: No evidence of thrombus. Normal compressibility, respiratory phasicity and response to augmentation. Saphenofemoral Junction: Surgically absent. Profunda Femoral Vein: No evidence of thrombus. Normal compressibility and flow on color Doppler imaging. Femoral Vein: No evidence of thrombus. Normal compressibility, respiratory phasicity and response to augmentation. Popliteal Vein: No evidence of thrombus. Normal compressibility, respiratory phasicity and response to augmentation. Calf Veins: No evidence of thrombus. Normal compressibility and flow on color Doppler imaging. Superficial Great Saphenous Vein: Surgically absent. Venous Reflux:  None. Other Findings:  None. IMPRESSION: No evidence of DVT within either lower extremity. Electronically Signed   By: Sandi Mariscal M.D.   On: 06/16/2018 14:52       Assessment & Plan:   Problem List Items Addressed This Visit    Acute right-sided thoracic back pain    Still present but has improved.  Previous xray  unrevealing.  Discussed further evaluation including bone scan.  She declines further testing at this time.  Will notify me if changes her mind of if persistent pain.       Barrett's esophagus    Followed by GI.        Diabetes mellitus ([place])    Low carb diet and exercise.  Follow met b and a1c.        GERD (gastroesophageal reflux disease)    Controlled on current regimen.  Follow.        Health care maintenance    Physical today 06/30/18.  colonscopy 05/2012.  Mammogram 03/03/18 - Birads I.        Hypercholesterolemia    Low cholesterol diet and exercise.  Follow lipid panel and liver function tests.        Hypertension    Blood pressure as outlined.  Same medication regimen.  Follow pressures.  Follow metabolic panel.      Lymphedema    Persistent lower extremity swelling. Compression hose.  Recent ultrasound negative for DVT.  Request referral to Dr Renaldo Reel.        Relevant Orders   Ambulatory referral to Vascular Surgery   Obesity (BMI 30-39.9)    Discussed diet and exercise.  Follow.        Rheumatoid arthritis ([place])    Followed by Dr Jefm Bryant.  With increased knee pain.  Also swelling in hands.  Schedule earlier appt.  Has labs next week.        Relevant Orders   Ambulatory referral to Rheumatology   Thrombocytopenia [facility])    Other Visit Diagnoses    Right knee  pain, unspecified chronicity    -  Primary   Relevant Orders   Ambulatory referral to Rheumatology       Einar Pheasant, MD

## 2018-07-03 ENCOUNTER — Encounter: Payer: Self-pay | Admitting: Internal Medicine

## 2018-07-03 DIAGNOSIS — D696 Thrombocytopenia, unspecified: Secondary | ICD-10-CM | POA: Insufficient documentation

## 2018-07-03 NOTE — Assessment & Plan Note (Signed)
Still present but has improved.  Previous xray unrevealing.  Discussed further evaluation including bone scan.  She declines further testing at this time.  Will notify me if changes her mind of if persistent pain.

## 2018-07-03 NOTE — Assessment & Plan Note (Signed)
Physical today 06/30/18.  colonscopy 05/2012.  Mammogram 03/03/18 - Birads I.

## 2018-07-03 NOTE — Assessment & Plan Note (Signed)
Followed by GI

## 2018-07-03 NOTE — Assessment & Plan Note (Signed)
Low carb diet and exercise.  Follow met b and a1c.   

## 2018-07-03 NOTE — Assessment & Plan Note (Signed)
Controlled on current regimen.  Follow.  

## 2018-07-03 NOTE — Assessment & Plan Note (Signed)
Persistent lower extremity swelling. Compression hose.  Recent ultrasound negative for DVT.  Request referral to Dr Renaldo Reel.

## 2018-07-03 NOTE — Assessment & Plan Note (Signed)
Followed by Dr Jefm Bryant.  With increased knee pain.  Also swelling in hands.  Schedule earlier appt.  Has labs next week.

## 2018-07-03 NOTE — Assessment & Plan Note (Signed)
Blood pressure as outlined.  Same medication regimen.  Follow pressures.  Follow metabolic panel.  

## 2018-07-03 NOTE — Assessment & Plan Note (Signed)
Low cholesterol diet and exercise.  Follow lipid panel and liver function tests.  

## 2018-07-03 NOTE — Assessment & Plan Note (Signed)
Discussed diet and exercise.  Follow.  

## 2018-08-02 ENCOUNTER — Other Ambulatory Visit: Payer: Self-pay | Admitting: Internal Medicine

## 2018-08-03 ENCOUNTER — Other Ambulatory Visit: Payer: Self-pay | Admitting: Internal Medicine

## 2018-08-27 ENCOUNTER — Other Ambulatory Visit: Payer: Self-pay | Admitting: Internal Medicine

## 2018-08-31 ENCOUNTER — Ambulatory Visit (INDEPENDENT_AMBULATORY_CARE_PROVIDER_SITE_OTHER): Payer: Medicare Other | Admitting: Internal Medicine

## 2018-08-31 ENCOUNTER — Other Ambulatory Visit: Payer: Self-pay

## 2018-08-31 ENCOUNTER — Encounter: Payer: Self-pay | Admitting: Internal Medicine

## 2018-08-31 DIAGNOSIS — K2270 Barrett's esophagus without dysplasia: Secondary | ICD-10-CM | POA: Diagnosis not present

## 2018-08-31 DIAGNOSIS — K2100 Gastro-esophageal reflux disease with esophagitis, without bleeding: Secondary | ICD-10-CM

## 2018-08-31 DIAGNOSIS — E7800 Pure hypercholesterolemia, unspecified: Secondary | ICD-10-CM

## 2018-08-31 DIAGNOSIS — M546 Pain in thoracic spine: Secondary | ICD-10-CM | POA: Diagnosis not present

## 2018-08-31 DIAGNOSIS — E119 Type 2 diabetes mellitus without complications: Secondary | ICD-10-CM

## 2018-08-31 DIAGNOSIS — M069 Rheumatoid arthritis, unspecified: Secondary | ICD-10-CM

## 2018-08-31 DIAGNOSIS — I872 Venous insufficiency (chronic) (peripheral): Secondary | ICD-10-CM

## 2018-08-31 DIAGNOSIS — I10 Essential (primary) hypertension: Secondary | ICD-10-CM

## 2018-08-31 NOTE — Progress Notes (Addendum)
Patient ID: Nina Perez, female   DOB: [birth year], 78 y.o.   MRN: [id number] Virtual Visit via Telephone: Note  This visit type was conducted due to national recommendations for restrictions regarding the COVID-19 pandemic (e.g. social distancing).  This format is felt to be most appropriate for this patient at this time.  All issues noted in this document were discussed and addressed.  No physical exam was performed (except for noted visual exam findings with Video Visits).   I connected with Edgardo Roys on 08/31/18 at 11:30 AM EDT by telephone and verified that I am speaking with the correct person using two identifiers. Location patient: home Location provider: work Persons participating in the telephone visit: patient, provider  I discussed the limitations, risks, security and privacy concerns of performing an evaluation and management service by telephone and the availability of in person appointments.  The patient expressed understanding and agreed to proceed.   Reason for visit: scheduled follow up.   HPI: She was scheduled for a follow up visit.  Reports she is trying to stay in.  No known COVID exposure.  No cough or congestion.  No sob.  The previous rib pain she was experiencing resolved.  Some joint pain.  Sees Dr Jefm Bryant.  Overall relatively stable.  Tries to stay active.  No chest pain.  No acid reflux.  No abdominal pain.  Bowels moving.  No significant leg swelling now.  Trying to keep her legs elevated.  Reports noticing some issues with her bladder.  Reports noticing when she gets out of bed, some leakage.  Discussed bladder exercises and evaluation and treatment.  She elects to hold on anything further at this point.  States her sugars are averaging 90-111 in the am.  No checking her pressure.  Taking medications regularly.     ROS: See pertinent positives and negatives per HPI.  Past Medical History:  Diagnosis Date  . Barrett's esophagus   . Cancer ([place])    skin ca  .  Diabetes mellitus ([place])   . Gout   . History of colon cancer    adenomatous polyps  . Hypercholesterolemia   . Hypertension   . Inflammatory arthritis   . Osteoarthritis    knees, spine  . Phlebitis    x2 (with pregnancy)  . 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 Pulmonary hypertension ([place])   . Reflux esophagitis   . Rheumatoid arthritis(714.0)    positive RF, FANA, RNP, negative CCP ab and anti DNA,.  neg anti-SCL 70  . 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 BREAST CYST ASPIRATION Left    neg  . VEIN LIGATION AND STRIPPING      Family History  Problem Relation Age of Onset  . Arthritis Mother   . Heart attack Father   . Colon cancer Neg Hx   . Breast cancer Neg Hx     SOCIAL HX: reviewed.    Current Outpatient Medications:  .  allopurinol (ZYLOPRIM) 300 MG tablet, TAKE 1 TABLET BY MOUTH ONCE DAILY, Disp: 90 tablet, Rfl: 0 .  amLODipine (NORVASC) 10 MG tablet, TAKE 1 TABLET BY MOUTH ONCE DAILY, Disp: 90 tablet, Rfl: 3 .  aspirin 81 MG tablet, Take 81 mg by mouth daily. Reported on 07/24/2015, Disp: , Rfl:  .  diclofenac sodium (VOLTAREN) 1 % GEL, Apply 4 g topically 4 (four) times daily., Disp: 1 Tube, Rfl: 3 .  fluticasone (FLONASE) 50 MCG/ACT nasal spray, Place 2 sprays into the nose daily., Disp: 16 g, Rfl: 1 .  folic acid (FOLVITE) 1 MG tablet, Take 1 mg by mouth daily., Disp: , Rfl:  .  glucose blood (BAYER CONTOUR TEST) test strip, USE  STRIP TO CHECK GLUCOSE TWICE DAILY. Dx: E11.9, Disp: 100 each, Rfl: 12 .  lidocaine (LIDODERM) 5 %, Place 1 patch onto the skin daily. Remove & Discard patch within 12 hours., Disp: 30 patch, Rfl: 0 .  losartan (COZAAR) 100 MG tablet, Take 1 tablet by mouth once daily, Disp: 90 tablet, Rfl: 0 .  metFORMIN (GLUCOPHAGE) 500 MG tablet, Take 1 tablet by mouth once daily, Disp: 90 tablet, Rfl: 0 .  methotrexate 2.5 MG tablet, Take 6 tablets q week, Disp: ,  Rfl:  .  metoprolol succinate (TOPROL-XL) 50 MG 24 hr tablet, TAKE 1 TABLET BY MOUTH ONCE DAILY IMMEDIATELY  FOLLOWING  A  MEAL., Disp: 90 tablet, Rfl: 1 .  MICROLET LANCETS MISC, Check sugar once daily, [employer] Microlet Lancets. Dx E11.9, Disp: 100 each, Rfl: 12 .  naproxen (NAPROSYN) 500 MG tablet, Take 500 mg by mouth 2 (two) times daily as needed., Disp: , Rfl:  .  nystatin cream (MYCOSTATIN), Apply to affected area bid, Disp: 60 g, Rfl: 0 .  omeprazole (PRILOSEC) 20 MG capsule, Take 20 mg by mouth daily., Disp: , Rfl:  .  spironolactone (ALDACTONE) 25 MG tablet, TAKE 1 TABLET BY MOUTH ONCE DAILY, Disp: 90 tablet, Rfl: 0  EXAM:  GENERAL: alert.  Sounds to be in no acute distress.  Answering questions appropriately.    PSYCH/NEURO: pleasant and cooperative, no obvious depression or anxiety, speech and thought processing grossly intact  ASSESSMENT AND PLAN:  Discussed the following assessment and plan:  Acute right-sided thoracic back pain  Barrett's esophagus without dysplasia  Type 2 diabetes mellitus without complication, without long-term current use of insulin ([place]) - Plan: Hemoglobin A1c  Gastroesophageal reflux disease with esophagitis  Hypercholesterolemia - Plan: Hepatic function panel, Lipid panel  Essential hypertension - Plan: TSH, Basic metabolic panel, Urinalysis, Routine w reflex microscopic  Rheumatoid arthritis, involving unspecified site, unspecified rheumatoid factor presence (HCC)  Venous stasis dermatitis of right lower extremity  Acute right-sided thoracic back pain Resolved.    Barrett's esophagus Followed by GI. Currently doing well.    Diabetes mellitus Low carb diet and exercise.  Sugars as outlined.  Follow met b and a1c.   GERD (gastroesophageal reflux disease) Controlled on current regimen.  Follow.    Hypercholesterolemia Low cholesterol diet and exercise.  Follow lipid panel.    Hypertension Blood pressure has been under reasonable  control.  Follow pressures.  Follow metabolic panel.    Rheumatoid arthritis Followed by Dr Jefm Bryant.  Some pain in joints.  Overall stable.  Follow.    Venous stasis dermatitis of right lower extremity Followed by vascular surgery.  Swelling is better.  Wears compression hose.  Continue leg elevation.      I discussed the assessment and treatment plan with the patient. The patient was provided an opportunity to ask questions and all were answered. The patient agreed with the plan and demonstrated an understanding of the instructions.   The patient was advised to call back or seek an in-person evaluation if the symptoms worsen or if the condition fails to improve as anticipated.  I provided 25 minutes of non-face-to-face time during this encounter.   Einar Pheasant, MD

## 2018-09-04 ENCOUNTER — Encounter: Payer: Self-pay | Admitting: Internal Medicine

## 2018-09-04 NOTE — Assessment & Plan Note (Signed)
Followed by Dr Jefm Bryant.  Some pain in joints.  Overall stable.  Follow.

## 2018-09-04 NOTE — Assessment & Plan Note (Signed)
Resolved

## 2018-09-04 NOTE — Assessment & Plan Note (Signed)
Low carb diet and exercise.  Sugars as outlined.  Follow met b and a1c.   

## 2018-09-04 NOTE — Assessment & Plan Note (Signed)
Followed by vascular surgery.  Swelling is better.  Wears compression hose.  Continue leg elevation.

## 2018-09-04 NOTE — Addendum Note (Signed)
Addended by: Alisa Graff on: 09/04/2018 06:09 PM   Modules accepted: Level of Service

## 2018-09-04 NOTE — Assessment & Plan Note (Signed)
Low cholesterol diet and exercise.  Follow lipid panel.   

## 2018-09-04 NOTE — Assessment & Plan Note (Signed)
Blood pressure has been under reasonable control.  Follow pressures.  Follow metabolic panel.   

## 2018-09-04 NOTE — Assessment & Plan Note (Signed)
Followed by GI. Currently doing well.

## 2018-09-04 NOTE — Assessment & Plan Note (Signed)
Controlled on current regimen.  Follow.  

## 2018-09-07 ENCOUNTER — Other Ambulatory Visit: Payer: Self-pay | Admitting: Internal Medicine

## 2018-10-25 ENCOUNTER — Other Ambulatory Visit: Payer: Self-pay | Admitting: Internal Medicine

## 2018-11-05 ENCOUNTER — Other Ambulatory Visit: Payer: Self-pay

## 2018-11-05 ENCOUNTER — Other Ambulatory Visit (INDEPENDENT_AMBULATORY_CARE_PROVIDER_SITE_OTHER): Payer: Medicare Other

## 2018-11-05 DIAGNOSIS — E7800 Pure hypercholesterolemia, unspecified: Secondary | ICD-10-CM

## 2018-11-05 DIAGNOSIS — I10 Essential (primary) hypertension: Secondary | ICD-10-CM

## 2018-11-05 DIAGNOSIS — E119 Type 2 diabetes mellitus without complications: Secondary | ICD-10-CM | POA: Diagnosis not present

## 2018-11-05 LAB — BASIC METABOLIC PANEL
BUN: 20 mg/dL (ref 6–23)
CO2: 27 mEq/L (ref 19–32)
Calcium: 10 mg/dL (ref 8.4–10.5)
Chloride: 103 mEq/L (ref 96–112)
Creatinine, Ser: 1.18 mg/dL (ref 0.40–1.20)
GFR: 44.36 mL/min — ABNORMAL LOW (ref >60.00)
Glucose, Bld: 107 mg/dL — ABNORMAL HIGH (ref 70–99)
Potassium: 4.8 mEq/L (ref 3.5–5.1)
Sodium: 138 mEq/L (ref 135–145)

## 2018-11-05 LAB — URINALYSIS, ROUTINE W REFLEX MICROSCOPIC
Hgb urine dipstick: NEGATIVE
Ketones, ur: NEGATIVE
Nitrite: NEGATIVE
RBC / HPF: NONE SEEN (ref 0–?)
Specific Gravity, Urine: 1.02 (ref 1.000–1.030)
Total Protein, Urine: 30 — AB
Urine Glucose: NEGATIVE
Urobilinogen, UA: 0.2 (ref 0.0–1.0)
pH: 6 (ref 5.0–8.0)

## 2018-11-05 LAB — HEPATIC FUNCTION PANEL
ALT: 24 U/L (ref 0–35)
AST: 30 U/L (ref 0–37)
Albumin: 3.7 g/dL (ref 3.5–5.2)
Alkaline Phosphatase: 87 U/L (ref 39–117)
Bilirubin, Direct: 0.1 mg/dL (ref 0.0–0.3)
Total Bilirubin: 0.7 mg/dL (ref 0.2–1.2)
Total Protein: 6.5 g/dL (ref 6.0–8.3)

## 2018-11-05 LAB — LIPID PANEL
Cholesterol: 158 mg/dL (ref 0–200)
HDL: 38.8 mg/dL — ABNORMAL LOW (ref >39.00)
LDL Cholesterol: 88 mg/dL (ref 0–99)
NonHDL: 118.94
Total CHOL/HDL Ratio: 4
Triglycerides: 155 mg/dL — ABNORMAL HIGH (ref 0.0–149.0)
VLDL: 31 mg/dL (ref 0.0–40.0)

## 2018-11-05 LAB — TSH: TSH: 5.32 u[IU]/mL — ABNORMAL HIGH (ref 0.35–4.50)

## 2018-11-05 LAB — HEMOGLOBIN A1C: Hgb A1c MFr Bld: 6 % (ref 4.6–6.5)

## 2018-11-09 ENCOUNTER — Other Ambulatory Visit: Payer: Self-pay

## 2018-11-09 ENCOUNTER — Encounter: Payer: Self-pay | Admitting: Internal Medicine

## 2018-11-09 ENCOUNTER — Ambulatory Visit (INDEPENDENT_AMBULATORY_CARE_PROVIDER_SITE_OTHER): Payer: Medicare Other

## 2018-11-09 ENCOUNTER — Ambulatory Visit (INDEPENDENT_AMBULATORY_CARE_PROVIDER_SITE_OTHER): Payer: Medicare Other | Admitting: Internal Medicine

## 2018-11-09 DIAGNOSIS — K2100 Gastro-esophageal reflux disease with esophagitis, without bleeding: Secondary | ICD-10-CM

## 2018-11-09 DIAGNOSIS — Z9109 Other allergy status, other than to drugs and biological substances: Secondary | ICD-10-CM

## 2018-11-09 DIAGNOSIS — E119 Type 2 diabetes mellitus without complications: Secondary | ICD-10-CM | POA: Diagnosis not present

## 2018-11-09 DIAGNOSIS — R944 Abnormal results of kidney function studies: Secondary | ICD-10-CM

## 2018-11-09 DIAGNOSIS — I890 Lymphedema, not elsewhere classified: Secondary | ICD-10-CM

## 2018-11-09 DIAGNOSIS — Z0000 Encounter for general adult medical examination without abnormal findings: Secondary | ICD-10-CM | POA: Diagnosis not present

## 2018-11-09 DIAGNOSIS — R7989 Other specified abnormal findings of blood chemistry: Secondary | ICD-10-CM

## 2018-11-09 DIAGNOSIS — I10 Essential (primary) hypertension: Secondary | ICD-10-CM

## 2018-11-09 DIAGNOSIS — R32 Unspecified urinary incontinence: Secondary | ICD-10-CM

## 2018-11-09 DIAGNOSIS — E7800 Pure hypercholesterolemia, unspecified: Secondary | ICD-10-CM

## 2018-11-09 DIAGNOSIS — I872 Venous insufficiency (chronic) (peripheral): Secondary | ICD-10-CM

## 2018-11-09 DIAGNOSIS — D696 Thrombocytopenia, unspecified: Secondary | ICD-10-CM

## 2018-11-09 DIAGNOSIS — K2270 Barrett's esophagus without dysplasia: Secondary | ICD-10-CM | POA: Diagnosis not present

## 2018-11-09 DIAGNOSIS — M069 Rheumatoid arthritis, unspecified: Secondary | ICD-10-CM

## 2018-11-09 DIAGNOSIS — E669 Obesity, unspecified: Secondary | ICD-10-CM

## 2018-11-09 NOTE — Progress Notes (Signed)
Subjective:   Nina Perez is a 78 y.o. female who presents for Medicare Annual (Subsequent) preventive examination.  Review of Systems:  No ROS.  Medicare Wellness Virtual Visit.  Visual/audio telehealth visit, UTA vital signs.   See social history for additional risk factors.   Cardiac Risk Factors include: advanced age (>60men, >44 women);hypertension;diabetes mellitus     Objective:     Vitals: LMP 05/25/1988   There is no height or weight on file to calculate BMI.  Advanced Directives 11/09/2018 11/06/2017 03/20/2016 03/04/2016 12/14/2015 09/04/2015  Does Patient Have a Medical Advance Directive? No No No No No No  Would patient like information on creating a medical advance directive? Yes (MAU/Ambulatory/Procedural Areas - Information given) Yes (MAU/Ambulatory/Procedural Areas - Information given) No - patient declined information - - -    Tobacco Social History   Tobacco Use  Smoking Status Former Smoker  . Packs/day: 0.50  . Years: 15.00  . Pack years: 7.50  . Quit date: 05/12/1988  . Years since quitting: 30.5  Smokeless Tobacco Never Used     Counseling given: Not Answered   Clinical Intake:  Pre-visit preparation completed: Yes        Diabetes: Yes(Followed by pcp)  How often do you need to have someone help you when you read instructions, pamphlets, or other written materials from your doctor or pharmacy?: 1 - Never  Interpreter Needed?: No     Past Medical History:  Diagnosis Date  . Barrett's esophagus   . Cancer ([place])    skin ca  . Diabetes mellitus ([place])   . Gout   . History of colon cancer    adenomatous polyps  . Hypercholesterolemia   . Hypertension   . Inflammatory arthritis   . Osteoarthritis    knees, spine  . Phlebitis    x2 (with pregnancy)  . 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 Pulmonary hypertension ([place])   . Reflux esophagitis   . Rheumatoid arthritis(714.0)    positive RF, FANA, RNP, negative CCP ab and anti DNA,.  neg  anti-SCL 70  . 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 BREAST CYST ASPIRATION Left    neg  . VEIN LIGATION AND STRIPPING     Family History  Problem Relation Age of Onset  . Arthritis Mother   . Heart attack Father   . Colon cancer Neg Hx   . Breast cancer Neg Hx    Social History   Socioeconomic History  . Marital status: Married    Spouse name: Not on file  . Number of children: Not on file  . Years of education: Not on file  . Highest education level: Not on file  Occupational History  . Not on file  Social Needs  . Financial resource strain: Not hard at all  . Food insecurity    Worry: Never true    Inability: Never true  . Transportation needs    Medical: No    Non-medical: No  Tobacco Use  . Smoking status: Former Smoker    Packs/day: 0.50    Years: 15.00    Pack years: 7.50    Quit date: 05/12/1988    Years since quitting: 30.5  . Smokeless tobacco: Never Used  Substance and Sexual Activity  . Alcohol use: No    Alcohol/week: 0.0 standard drinks  . Drug use: No  . Sexual activity: Yes  Lifestyle  .  Physical activity    Days per week: Not on file    Minutes per session: Not on file  . Stress: Only a little  Relationships  . Social Herbalist on phone: Not on file    Gets together: Not on file    Attends religious service: Not on file    Active member of club or organization: Not on file    Attends meetings of clubs or organizations: Not on file    Relationship status: Not on file  Other Topics Concern  . Not on file  Social History Narrative  . Not on file    Outpatient Encounter Medications as of 11/09/2018  Medication Sig  . allopurinol (ZYLOPRIM) 300 MG tablet Take 1 tablet by mouth once daily  . amLODipine (NORVASC) 10 MG tablet TAKE 1 TABLET BY MOUTH ONCE DAILY  . aspirin 81 MG tablet Take 81 mg by mouth daily. Reported on 07/24/2015  . diclofenac  sodium (VOLTAREN) 1 % GEL Apply 4 g topically 4 (four) times daily.  . fluticasone (FLONASE) 50 MCG/ACT nasal spray Place 2 sprays into the nose daily.  . folic acid (FOLVITE) 1 MG tablet Take 1 mg by mouth daily.  Marland Kitchen glucose blood (BAYER CONTOUR TEST) test strip USE  STRIP TO CHECK GLUCOSE TWICE DAILY. Dx: E11.9  . lidocaine (LIDODERM) 5 % Place 1 patch onto the skin daily. Remove & Discard patch within 12 hours.  Marland Kitchen losartan (COZAAR) 100 MG tablet Take 1 tablet by mouth once daily  . metFORMIN (GLUCOPHAGE) 500 MG tablet Take 1 tablet by mouth once daily  . methotrexate 2.5 MG tablet Take 6 tablets q week  . metoprolol succinate (TOPROL-XL) 50 MG 24 hr tablet TAKE 1 TABLET BY MOUTH ONCE DAILY IMMEDIATELY FOLLOWING A MEAL  . MICROLET LANCETS MISC Check sugar once daily, [employer] Microlet Lancets. Dx E11.9  . naproxen (NAPROSYN) 500 MG tablet Take 500 mg by mouth 2 (two) times daily as needed.  . nystatin cream (MYCOSTATIN) Apply to affected area bid  . omeprazole (PRILOSEC) 20 MG capsule Take 20 mg by mouth daily.  Marland Kitchen spironolactone (ALDACTONE) 25 MG tablet Take 1 tablet by mouth once daily   No facility-administered encounter medications on file as of 11/09/2018.     Activities of Daily Living In your present state of health, do you have any difficulty performing the following activities: 11/09/2018  Hearing? N  Vision? N  Difficulty concentrating or making decisions? N  Walking or climbing stairs? Y  Comment arthritis in R knee, cane in use when ambulating  Dressing or bathing? N  Doing errands, shopping? N  Comment Her husband does most of the driving  Preparing Food and eating ? N  Using the Toilet? N  In the past six months, have you accidently leaked urine? Y  Comment Managed by wearing daily  Do you have problems with loss of bowel control? N  Managing your Medications? N  Managing your Finances? Y  Comment Husband manages  Housekeeping or managing your Housekeeping? N  Some  recent data might be hidden    Patient Care Team: Einar Pheasant, MD as PCP - General (Internal Medicine)    Assessment:   This is a routine wellness examination for [name].  I connected with patient 11/09/18 at  8:30 AM EDT by an audio enabled telemedicine application and verified that I am speaking with the correct person using two identifiers. Patient stated full name and DOB. Patient gave permission to  continue with virtual visit. Patient's location was at home and Nurse's location was at [place] office.   Health Screenings  Mammogram - 02/2018 Colonoscopy - 05/2012 Bone Density - discussed Glaucoma -none Hearing -demonstrates normal hearing during visit. Hemoglobin A1C - 10/2018 Cholesterol - 10/2018 Dental- visits every 12 months.  Upper plate.  Vision- visits within the last 12 months.  Social  Alcohol intake - no       Smoking history- former    Smokers in home? none Illicit drug use? none Exercise - very little walking; some chair exercise Diet - regular Sexually Active -yes BMI- discussed the importance of a healthy diet, water intake and the benefits of aerobic exercise.  Educational material provided.   Safety  Patient feels safe at home- yes Patient does have smoke detectors at home- yes Patient does wear sunscreen or protective clothing when in direct sunlight -yes Patient does wear seat belt when in a moving vehicle -yes Patient drives- yes, very little  Covid-19 precautions and sickness symptoms discussed.   Activities of Daily Living Patient denies needing assistance with: household chores, feeding themselves, getting from bed to chair, getting to the toilet, bathing/showering, dressing, or preparing meals.  Husband manages finances.   Depression Screen Patient denies losing interest in daily life or feeling hopeless.   Medication-taking as directed and without issues.   Fall Screen Patient denies being afraid of falling or falling in the last year.    Memory Screen Patient is alert.  Correctly identified the president of the [place], season and recall. Patient likes to read and complete word search for brain stimulation.  Immunizations The following Immunizations were discussed: Influenza, shingles, pneumonia, and tetanus.   Other Providers Patient Care Team: Einar Pheasant, MD as PCP - General (Internal Medicine)  Exercise Activities and Dietary recommendations Current Exercise Habits: Home exercise routine, Type of exercise: walking, Intensity: Mild  Goals      Patient Stated   . DIET - EAT MORE FRUITS AND VEGETABLES (pt-stated)       Fall Risk Fall Risk  11/09/2018 11/06/2017 12/08/2016 04/14/2016 03/20/2016  Falls in the past year? 0 No No No No  Comment - - - - -   Depression Screen PHQ 2/9 Scores 11/09/2018 11/06/2017 12/08/2016 04/14/2016  PHQ - 2 Score 1 0 0 0     Cognitive Function MMSE - Mini Mental State Exam 11/06/2017 03/20/2016  Not completed: - Unable to complete  Orientation to time 5 -  Orientation to Place 5 -  Registration 3 -  Attention/ Calculation 5 -  Recall 2 -  Language- name 2 objects 2 -  Language- repeat 1 -  Language- follow 3 step command 3 -  Language- read & follow direction 1 -  Write a sentence 1 -  Copy design 1 -  Total score 29 -     6CIT Screen 11/09/2018  What Year? 0 points  What month? 0 points  What time? 0 points  Count back from 20 0 points  Months in reverse 0 points    Immunization History  Administered Date(s) Administered  . Influenza Split 02/09/2013  . Influenza Whole 02/18/2017  . Influenza, High Dose Seasonal PF 04/14/2016, 02/23/2018  . Influenza,inj,Quad PF,6+ Mos 02/28/2014, 02/01/2015  . Influenza-Unspecified 02/20/2012  . Pneumococcal Conjugate-13 09/06/2013  . Pneumococcal Polysaccharide-23 06/12/2017   Screening Tests Health Maintenance  Topic Date Due  . FOOT EXAM  05/23/1951  . TETANUS/TDAP  05/22/1960  . DEXA SCAN  05/22/2006  .  OPHTHALMOLOGY  EXAM  11/03/2017  . INFLUENZA VACCINE  12/11/2018  . MAMMOGRAM  03/04/2019  . HEMOGLOBIN A1C  05/07/2019  . PNA vac Low Risk Adult  Completed      Plan:    End of life planning; Advance aging; Advanced directives discussed.  Copy of current HCPOA/Living Will requested upon completion.     I have personally reviewed and noted the following in the patient's chart:   . Medical and social history . Use of alcohol, tobacco or illicit drugs  . Current medications and supplements . Functional ability and status . Nutritional status . Physical activity . Advanced directives . List of other physicians . Hospitalizations, surgeries, and ER visits in previous 12 months . Vitals . Screenings to include cognitive, depression, and falls . Referrals and appointments  In addition, I have reviewed and discussed with patient certain preventive protocols, quality metrics, and best practice recommendations. A written personalized care plan for preventive services as well as general preventive health recommendations were provided to patient.     Varney Biles, LPN  11/06/3660   Reviewed above information.  Agree with assessment and plan.    Dr Nicki Reaper

## 2018-11-09 NOTE — Progress Notes (Signed)
Patient ID: Nina Perez, female   DOB: March 26, 1941, 78 y.o.   MRN: [id number]   Virtual Visit via telephone Note  This visit type was conducted due to national recommendations for restrictions regarding the COVID-19 pandemic (e.g. social distancing).  This format is felt to be most appropriate for this patient at this time.  All issues noted in this document were discussed and addressed.  No physical exam was performed (except for noted visual exam findings with Video Visits).   I connected with Edgardo Roys today by telephone and verified that I am speaking with the correct person using two identifiers. Location patient: home Location provider: work Persons participating in the telephone visit: patient, provider  I discussed the limitations, risks, security and privacy concerns of performing an evaluation and management service by telephone and the availability of in person appointments.  The patient expressed understanding and agreed to proceed.   Reason for visit: scheduled follow up  HPI: She reports she is doing relatively well.  Seeing pulmonary for f/u pulmonary hypertension.  Declines inhalers.  Last evaluated 09/2018.  Recommended f/u in one year.  Discussed using incentive spirometer.  Trying to stay in due to covid restrictions.  No fever.  No chest congestion.  No sob or chest tightness.  No acid reflux.  No abdominal pain.  Bowels moving.  Some bladder leakage.  Discussed labs.  Legs better - wearing compression hose.     ROS: See pertinent positives and negatives per HPI.  Past Medical History:  Diagnosis Date  . Barrett's esophagus   . Cancer ([facility])    skin ca  . Diabetes mellitus ([facility])   . Gout   . History of colon cancer    adenomatous polyps  . Hypercholesterolemia   . Hypertension   . Inflammatory arthritis   . Osteoarthritis    knees, spine  . Phlebitis    x2 (with pregnancy)  . 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 Pulmonary hypertension ([facility])   . Reflux  esophagitis   . Rheumatoid arthritis(714.0)    positive RF, FANA, RNP, negative CCP ab and anti DNA,.  neg anti-SCL 70  . 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 BREAST CYST ASPIRATION Left    neg  . VEIN LIGATION AND STRIPPING      Family History  Problem Relation Age of Onset  . Arthritis Mother   . Heart attack Father   . Colon cancer Neg Hx   . Breast cancer Neg Hx     SOCIAL HX: reviewed.    Current Outpatient Medications:  .  allopurinol (ZYLOPRIM) 300 MG tablet, Take 1 tablet by mouth once daily, Disp: 90 tablet, Rfl: 0 .  amLODipine (NORVASC) 10 MG tablet, TAKE 1 TABLET BY MOUTH ONCE DAILY, Disp: 90 tablet, Rfl: 3 .  aspirin 81 MG tablet, Take 81 mg by mouth daily. Reported on 07/24/2015, Disp: , Rfl:  .  diclofenac sodium (VOLTAREN) 1 % GEL, Apply 4 g topically 4 (four) times daily., Disp: 1 Tube, Rfl: 3 .  fluticasone (FLONASE) 50 MCG/ACT nasal spray, Place 2 sprays into the nose daily., Disp: 16 g, Rfl: 1 .  folic acid (FOLVITE) 1 MG tablet, Take 1 mg by mouth daily., Disp: , Rfl:  .  glucose blood (BAYER CONTOUR TEST) test strip, USE  STRIP TO CHECK GLUCOSE TWICE DAILY. Dx: E11.9, Disp: 100 each, Rfl: 12 .  lidocaine (  LIDODERM) 5 %, Place 1 patch onto the skin daily. Remove & Discard patch within 12 hours., Disp: 30 patch, Rfl: 0 .  losartan (COZAAR) 100 MG tablet, Take 1 tablet by mouth once daily, Disp: 90 tablet, Rfl: 0 .  metFORMIN (GLUCOPHAGE) 500 MG tablet, Take 1 tablet by mouth once daily, Disp: 90 tablet, Rfl: 0 .  methotrexate 2.5 MG tablet, Take 6 tablets q week, Disp: , Rfl:  .  metoprolol succinate (TOPROL-XL) 50 MG 24 hr tablet, TAKE 1 TABLET BY MOUTH ONCE DAILY IMMEDIATELY FOLLOWING A MEAL, Disp: 90 tablet, Rfl: 0 .  MICROLET LANCETS MISC, Check sugar once daily, [employer] Microlet Lancets. Dx E11.9, Disp: 100 each, Rfl: 12 .  naproxen (NAPROSYN) 500 MG tablet, Take 500 mg by  mouth 2 (two) times daily as needed., Disp: , Rfl:  .  nystatin cream (MYCOSTATIN), Apply to affected area bid, Disp: 60 g, Rfl: 0 .  omeprazole (PRILOSEC) 20 MG capsule, Take 20 mg by mouth daily., Disp: , Rfl:  .  spironolactone (ALDACTONE) 25 MG tablet, Take 1 tablet by mouth once daily, Disp: 90 tablet, Rfl: 0  EXAM:  GENERAL: alert.  Sounds to be in no acute distress.  Answering questions appropriately.    PSYCH/NEURO: pleasant and cooperative, no obvious depression or anxiety, speech and thought processing grossly intact  ASSESSMENT AND PLAN:  Discussed the following assessment and plan:  Barrett's esophagus Followed by GI.  Currently without symptoms.    Diabetes mellitus Low carb diet and exercise.  Follow met b and a1c.   Environmental allergies Controlled.    GERD (gastroesophageal reflux disease) No acid reflux reported.  Continue current regimen.   Hypercholesterolemia Low cholesterol diet and exercise.  Follow lipid panel.   Hypertension Blood pressure has been doing well.  Continue current regimen.  Follow pressures.  Follow metabolic panel.    Lymphedema Compression hose.  Better.  Follow.    Obesity (BMI 30-39.9) Discussed diet and exercise.  Follow.    Rheumatoid arthritis Followed by Dr Jefm Bryant.  Stable.  S/p right knee injection 08/2018.  Helped some.    Thrombocytopenia ([facility] [facility]) Follow cbc.   Venous stasis dermatitis of right lower extremity Has been evaluated by vascular surgery.  Continue compression hose.    Decreased GFR Decreased GFR.  Stay hydrated.  Follow metabolic panel.  Avoid antiinflammatories.    Bladder incontinence Bladder leakage as outlined. Discussed pelvic floor exercises/therapy.  Check urinalysis.    Elevated TSH Slight elevated tsh on recent labs.  Recheck tsh in 6-8 weeks.      I discussed the assessment and treatment plan with the patient. The patient was provided an opportunity to ask questions and all were answered.  The patient agreed with the plan and demonstrated an understanding of the instructions.   The patient was advised to call back or seek an in-person evaluation if the symptoms worsen or if the condition fails to improve as anticipated.  I provided 22 minutes of non-face-to-face time during this encounter.   Einar Pheasant, MD

## 2018-11-09 NOTE — Patient Instructions (Addendum)
  Ms. Nina Perez , Thank you for taking time to come for your Medicare Wellness Visit. I appreciate your ongoing commitment to your health goals. Please review the following plan we discussed and let me know if I can assist you in the future.   These are the goals we discussed: Goals      Patient Stated   . DIET - EAT MORE FRUITS AND VEGETABLES (pt-stated)       This is a list of the screening recommended for you and due dates:  Health Maintenance  Topic Date Due  . Complete foot exam   05/23/1951  . Tetanus Vaccine  05/22/1960  . DEXA scan (bone density measurement)  05/22/2006  . Eye exam for diabetics  11/03/2017  . Flu Shot  12/11/2018  . Mammogram  03/04/2019  . Hemoglobin A1C  05/07/2019  . Pneumonia vaccines  Completed

## 2018-11-13 ENCOUNTER — Encounter: Payer: Self-pay | Admitting: Internal Medicine

## 2018-11-13 DIAGNOSIS — R32 Unspecified urinary incontinence: Secondary | ICD-10-CM | POA: Insufficient documentation

## 2018-11-13 DIAGNOSIS — R7989 Other specified abnormal findings of blood chemistry: Secondary | ICD-10-CM | POA: Insufficient documentation

## 2018-11-13 DIAGNOSIS — R944 Abnormal results of kidney function studies: Secondary | ICD-10-CM | POA: Insufficient documentation

## 2018-11-13 NOTE — Assessment & Plan Note (Signed)
Follow cbc.  

## 2018-11-13 NOTE — Assessment & Plan Note (Signed)
Slight elevated tsh on recent labs.  Recheck tsh in 6-8 weeks.

## 2018-11-13 NOTE — Assessment & Plan Note (Signed)
Has been evaluated by vascular surgery.  Continue compression hose.

## 2018-11-13 NOTE — Assessment & Plan Note (Signed)
Bladder leakage as outlined. Discussed pelvic floor exercises/therapy.  Check urinalysis.

## 2018-11-13 NOTE — Assessment & Plan Note (Signed)
Discussed diet and exercise.  Follow.  

## 2018-11-13 NOTE — Assessment & Plan Note (Signed)
Low carb diet and exercise.  Follow met b and a1c.  

## 2018-11-13 NOTE — Assessment & Plan Note (Signed)
Compression hose.  Better.  Follow.

## 2018-11-13 NOTE — Assessment & Plan Note (Signed)
Low cholesterol diet and exercise.  Follow lipid panel.   

## 2018-11-13 NOTE — Assessment & Plan Note (Signed)
Blood pressure has been doing well.  Continue current regimen.  Follow pressures.  Follow metabolic panel.

## 2018-11-13 NOTE — Assessment & Plan Note (Signed)
Followed by Dr Jefm Bryant.  Stable.  S/p right knee injection 08/2018.  Helped some.

## 2018-11-13 NOTE — Assessment & Plan Note (Signed)
Decreased GFR.  Stay hydrated.  Follow metabolic panel.  Avoid antiinflammatories.

## 2018-11-13 NOTE — Assessment & Plan Note (Signed)
Controlled.  

## 2018-11-13 NOTE — Assessment & Plan Note (Signed)
Followed by GI.  Currently without symptoms.

## 2018-11-13 NOTE — Assessment & Plan Note (Signed)
No acid reflux reported.  Continue current regimen.

## 2018-11-16 ENCOUNTER — Other Ambulatory Visit: Payer: Self-pay | Admitting: Internal Medicine

## 2018-11-24 ENCOUNTER — Other Ambulatory Visit: Payer: Self-pay

## 2018-11-24 ENCOUNTER — Other Ambulatory Visit (INDEPENDENT_AMBULATORY_CARE_PROVIDER_SITE_OTHER): Payer: Medicare Other

## 2018-11-24 DIAGNOSIS — R32 Unspecified urinary incontinence: Secondary | ICD-10-CM | POA: Diagnosis not present

## 2018-11-24 DIAGNOSIS — R944 Abnormal results of kidney function studies: Secondary | ICD-10-CM

## 2018-11-24 LAB — URINALYSIS, ROUTINE W REFLEX MICROSCOPIC
Hgb urine dipstick: NEGATIVE
Ketones, ur: NEGATIVE
Nitrite: NEGATIVE
Specific Gravity, Urine: 1.025 (ref 1.000–1.030)
Urine Glucose: NEGATIVE
Urobilinogen, UA: 1 (ref 0.0–1.0)
pH: 5.5 (ref 5.0–8.0)

## 2018-11-24 LAB — BASIC METABOLIC PANEL
BUN: 18 mg/dL (ref 6–23)
CO2: 25 mEq/L (ref 19–32)
Calcium: 9.8 mg/dL (ref 8.4–10.5)
Chloride: 104 mEq/L (ref 96–112)
Creatinine, Ser: 1.18 mg/dL (ref 0.40–1.20)
GFR: 44.36 mL/min — ABNORMAL LOW (ref >60.00)
Glucose, Bld: 143 mg/dL — ABNORMAL HIGH (ref 70–99)
Potassium: 4.4 mEq/L (ref 3.5–5.1)
Sodium: 137 mEq/L (ref 135–145)

## 2018-11-24 NOTE — Addendum Note (Signed)
Addended by: Leeanne Rio on: 11/24/2018 10:40 AM   Modules accepted: Orders

## 2018-11-25 ENCOUNTER — Other Ambulatory Visit: Payer: Self-pay | Admitting: Internal Medicine

## 2018-11-25 DIAGNOSIS — R7989 Other specified abnormal findings of blood chemistry: Secondary | ICD-10-CM

## 2018-11-25 DIAGNOSIS — E7800 Pure hypercholesterolemia, unspecified: Secondary | ICD-10-CM

## 2018-11-25 DIAGNOSIS — I10 Essential (primary) hypertension: Secondary | ICD-10-CM

## 2018-11-25 DIAGNOSIS — E119 Type 2 diabetes mellitus without complications: Secondary | ICD-10-CM

## 2018-11-25 NOTE — Progress Notes (Signed)
F/u labs ordered.   

## 2018-11-30 ENCOUNTER — Other Ambulatory Visit: Payer: Self-pay | Admitting: Internal Medicine

## 2018-12-14 ENCOUNTER — Other Ambulatory Visit: Payer: Self-pay | Admitting: Internal Medicine

## 2018-12-16 NOTE — Telephone Encounter (Signed)
Pt calling to check and see when this RX will be filled.

## 2019-01-19 ENCOUNTER — Other Ambulatory Visit: Payer: Medicare Other

## 2019-01-25 ENCOUNTER — Other Ambulatory Visit: Payer: Self-pay | Admitting: Internal Medicine

## 2019-02-07 ENCOUNTER — Ambulatory Visit (INDEPENDENT_AMBULATORY_CARE_PROVIDER_SITE_OTHER): Payer: Medicare Other

## 2019-02-07 ENCOUNTER — Other Ambulatory Visit: Payer: Self-pay

## 2019-02-07 DIAGNOSIS — Z23 Encounter for immunization: Secondary | ICD-10-CM

## 2019-02-10 ENCOUNTER — Other Ambulatory Visit: Payer: Self-pay

## 2019-02-10 ENCOUNTER — Other Ambulatory Visit (INDEPENDENT_AMBULATORY_CARE_PROVIDER_SITE_OTHER): Payer: Medicare Other

## 2019-02-10 DIAGNOSIS — I10 Essential (primary) hypertension: Secondary | ICD-10-CM | POA: Diagnosis not present

## 2019-02-10 DIAGNOSIS — E7800 Pure hypercholesterolemia, unspecified: Secondary | ICD-10-CM | POA: Diagnosis not present

## 2019-02-10 DIAGNOSIS — E119 Type 2 diabetes mellitus without complications: Secondary | ICD-10-CM | POA: Diagnosis not present

## 2019-02-10 LAB — BASIC METABOLIC PANEL
BUN: 20 mg/dL (ref 6–23)
CO2: 27 mEq/L (ref 19–32)
Calcium: 9.9 mg/dL (ref 8.4–10.5)
Chloride: 104 mEq/L (ref 96–112)
Creatinine, Ser: 1.05 mg/dL (ref 0.40–1.20)
GFR: 50.72 mL/min — ABNORMAL LOW (ref >60.00)
Glucose, Bld: 106 mg/dL — ABNORMAL HIGH (ref 70–99)
Potassium: 4.5 mEq/L (ref 3.5–5.1)
Sodium: 139 mEq/L (ref 135–145)

## 2019-02-10 LAB — HEPATIC FUNCTION PANEL
ALT: 15 U/L (ref 0–35)
AST: 18 U/L (ref 0–37)
Albumin: 3.8 g/dL (ref 3.5–5.2)
Alkaline Phosphatase: 85 U/L (ref 39–117)
Bilirubin, Direct: 0.1 mg/dL (ref 0.0–0.3)
Total Bilirubin: 0.6 mg/dL (ref 0.2–1.2)
Total Protein: 6.7 g/dL (ref 6.0–8.3)

## 2019-02-10 LAB — LIPID PANEL
Cholesterol: 151 mg/dL (ref 0–200)
HDL: 39.8 mg/dL (ref >39.00)
LDL Cholesterol: 82 mg/dL (ref 0–99)
NonHDL: 110.79
Total CHOL/HDL Ratio: 4
Triglycerides: 146 mg/dL (ref 0.0–149.0)
VLDL: 29.2 mg/dL (ref 0.0–40.0)

## 2019-02-10 LAB — MICROALBUMIN / CREATININE URINE RATIO
Creatinine,U: 162.8 mg/dL
Microalb Creat Ratio: 4.7 mg/g (ref 0.0–30.0)
Microalb, Ur: 7.6 mg/dL — ABNORMAL HIGH (ref 0.0–1.9)

## 2019-02-10 LAB — TSH: TSH: 5.9 u[IU]/mL — ABNORMAL HIGH (ref 0.35–4.50)

## 2019-02-10 LAB — HEMOGLOBIN A1C: Hgb A1c MFr Bld: 6.3 % (ref 4.6–6.5)

## 2019-02-12 ENCOUNTER — Encounter: Payer: Self-pay | Admitting: Internal Medicine

## 2019-02-16 ENCOUNTER — Telehealth: Payer: Self-pay

## 2019-02-16 NOTE — Telephone Encounter (Signed)
Copied from [place] [phone]. Topic: Appointment Scheduling - Scheduling Inquiry for Clinic >> Feb 16, 2019  1:54 PM Nina Perez A wrote: Reason for CRM: pt called and has sore on the bottom of his foot and would like to know if she can come in today?   Best number [phone]

## 2019-02-16 NOTE — Telephone Encounter (Signed)
Called and spoke to patient.  Patient said that she has a sore on the side of her left foot.  Patient said foot is red and swollen. Patient said her foot is very painful.  Rates pain today greater than 10.  She said she can walk on foot but it hurts.  Patient said that sore appeared 4 weeks ago.  Patient said that she called the vein specialist who has been wrapping her foot weekly for the past 4 weeks.  Patient said that she has an appt with vein specialist, Dr. Renaldo Reel in [place] tomorrow @ 2:00 pm.   Patient is taking Tylenol Arthritis which was recommended for her to take by Dr. Eliot Ford  Patient advised to keep appt with Dr. Eliot Ford and informed that notes will be forwarded to Dr. Nicki Reaper for advisement.  Patient aware that Dr. Nicki Reaper has already left for the day.  Please advise.

## 2019-02-17 NOTE — Telephone Encounter (Signed)
Was not in the office.  Please call and confirm pt doing ok - described foot as red and very painful.  Per note, has appt with vascular today.

## 2019-02-17 NOTE — Telephone Encounter (Signed)
Patient aware and will keep appt with vascular today

## 2019-02-21 ENCOUNTER — Other Ambulatory Visit: Payer: Self-pay | Admitting: Internal Medicine

## 2019-03-02 ENCOUNTER — Other Ambulatory Visit: Payer: Self-pay

## 2019-03-02 ENCOUNTER — Other Ambulatory Visit: Payer: Self-pay | Admitting: Internal Medicine

## 2019-03-02 ENCOUNTER — Encounter: Payer: Medicare Other | Attending: Internal Medicine | Admitting: Internal Medicine

## 2019-03-02 ENCOUNTER — Ambulatory Visit
Admission: RE | Admit: 2019-03-02 | Discharge: 2019-03-02 | Disposition: A | Discharge status: Home / Self Care | Payer: Medicare Other | Source: Ambulatory Visit | Attending: Internal Medicine | Admitting: Internal Medicine

## 2019-03-02 DIAGNOSIS — M109 Gout, unspecified: Secondary | ICD-10-CM | POA: Diagnosis not present

## 2019-03-02 DIAGNOSIS — I10 Essential (primary) hypertension: Secondary | ICD-10-CM | POA: Insufficient documentation

## 2019-03-02 DIAGNOSIS — M349 Systemic sclerosis, unspecified: Secondary | ICD-10-CM | POA: Insufficient documentation

## 2019-03-02 DIAGNOSIS — M341 CR(E)ST syndrome: Secondary | ICD-10-CM | POA: Diagnosis not present

## 2019-03-02 DIAGNOSIS — Z8249 Family history of ischemic heart disease and other diseases of the circulatory system: Secondary | ICD-10-CM | POA: Insufficient documentation

## 2019-03-02 DIAGNOSIS — L97528 Non-pressure chronic ulcer of other part of left foot with other specified severity: Secondary | ICD-10-CM | POA: Diagnosis not present

## 2019-03-02 DIAGNOSIS — I272 Pulmonary hypertension, unspecified: Secondary | ICD-10-CM | POA: Insufficient documentation

## 2019-03-02 DIAGNOSIS — I252 Old myocardial infarction: Secondary | ICD-10-CM | POA: Insufficient documentation

## 2019-03-02 DIAGNOSIS — Z85828 Personal history of other malignant neoplasm of skin: Secondary | ICD-10-CM | POA: Insufficient documentation

## 2019-03-02 DIAGNOSIS — L089 Local infection of the skin and subcutaneous tissue, unspecified: Secondary | ICD-10-CM

## 2019-03-02 DIAGNOSIS — E11621 Type 2 diabetes mellitus with foot ulcer: Secondary | ICD-10-CM | POA: Insufficient documentation

## 2019-03-02 DIAGNOSIS — Z87891 Personal history of nicotine dependence: Secondary | ICD-10-CM | POA: Diagnosis not present

## 2019-03-02 DIAGNOSIS — I872 Venous insufficiency (chronic) (peripheral): Secondary | ICD-10-CM | POA: Diagnosis not present

## 2019-03-02 NOTE — Progress Notes (Signed)
EUNA, KENDER ([id number]) Visit Report for 03/02/2019 Abuse/Suicide Risk Screen Details Patient Name: Nina Perez, Nina Perez Date of Service: 03/02/2019 8:15 AM Medical Record Number: [id number] Patient Account Number: [id number] Date of Birth/Sex: [birth year] (78 y.o. F) Treating RN: Rodell Perna Primary Care Javon Snee: Dale [place] Other Clinician: Referring Adonis Ryther: Referral, Self Treating Latisa Belay/Extender: [employer] [place] in Treatment: 0 Abuse/Suicide Risk Screen Items Answer ABUSE RISK SCREEN: Has anyone close to you tried to hurt or harm you recentlyo No Do you feel uncomfortable with anyone in your familyo No Has anyone forced you do things that you didnot want to doo No Electronic Signature(s) Signed: 03/02/2019 3:15:35 PM By: Rodell Perna Entered By: Rodell Perna on 03/02/2019 08:38:24 Nina Perez ([id number]) -------------------------------------------------------------------------------- Activities of Daily Living Details Patient Name: Nina Perez Date of Service: 03/02/2019 8:15 AM Medical Record Number: [id number] Patient Account Number: [id number] Date of Birth/Sex: 10-Oct-1940 (78 y.o. F) Treating RN: Rodell Perna Primary Care Nino Amano: Dale [place] Other Clinician: Referring Kipton Skillen: Referral, Self Treating Jaionna Weisse/Extender: [facility] [place] in Treatment: 0 Activities of Daily Living Items Answer Activities of Daily Living (Please select one for each item) Drive Automobile Completely Able Take Medications Completely Able Use Telephone Completely Able Care for Appearance Completely Able Use Toilet Completely Able Bath / Shower Completely Able Dress Self Completely Able Feed Self Completely Able Walk Completely Able Get In / Out Bed Completely Able Housework Completely Able Prepare Meals Completely Able Handle Money Completely Able Shop for Self Completely Able Electronic Signature(s) Signed: 03/02/2019 3:15:35 PM By: Rodell Perna Entered By: Rodell Perna on 03/02/2019 08:38:39 Nina Perez ([id number]) -------------------------------------------------------------------------------- Education Screening Details Patient Name: Nina Perez Date of Service: 03/02/2019 8:15 AM Medical Record Number: [id number] Patient Account Number: [id number] Date of Birth/Sex: [birth year] (78 y.o. F) Treating RN: Rodell Perna Primary Care Carder Yin: Dale [place] Other Clinician: Referring Tania Perrott: Referral, Self Treating Maeven Mcdougall/Extender: [employer] [place] in Treatment: 0 Learning Preferences/Education Level/Primary Language Learning Preference: Explanation Highest Education Level: Grade School Preferred Language: English Cognitive Barrier Language Barrier: No Translator Needed: No Memory Deficit: No Emotional Barrier: No Cultural/Religious Beliefs Affecting Medical Care: No Physical Barrier Impaired Vision: No Impaired Hearing: No Decreased Hand dexterity: No Knowledge/Comprehension Knowledge Level: High Comprehension Level: High Ability to understand written High instructions: Ability to understand verbal High instructions: Motivation Anxiety Level: Calm Cooperation: Cooperative Education Importance: Acknowledges Need Interest in Health Problems: Asks Questions Perception: Coherent Willingness to Engage in Self- High Management Activities: Readiness to Engage in Self- High Management Activities: Electronic Signature(s) Signed: 03/02/2019 3:15:35 PM By: Rodell Perna Entered By: Rodell Perna on 03/02/2019 08:39:00 Nina Perez ([id number]) -------------------------------------------------------------------------------- Fall Risk Assessment Details Patient Name: Nina Perez Date of Service: 03/02/2019 8:15 AM Medical Record Number: [id number] Patient Account Number: [id number] Date of Birth/Sex: [birth year] (78 y.o. F) Treating RN: Rodell Perna Primary Care Kelvin Sennett: [place]  Other Clinician: Referring Renlee Floor: Referral, Self Treating Imelda Dandridge/Extender: [employer] [place] in Treatment: 0 Fall Risk Assessment Items Have you had 2 or more falls in the last 12 monthso 0 No Have you had any fall that resulted in injury in the last 12 monthso 0 No FALLS RISK SCREEN History of falling - immediate or within 3 months 0 No Secondary diagnosis (Do you have 2 or more medical diagnoseso) 0 No Ambulatory aid None/bed rest/wheelchair/nurse 0 No Crutches/cane/walker 15 Yes Furniture 0 No Intravenous therapy Access/Saline/Heparin Lock 0 No Gait/Transferring Normal/ bed rest/ wheelchair 0 No Weak (short steps with or without  shuffle, stooped but able to lift head while 10 Yes walking, may seek support from furniture) Impaired (short steps with shuffle, may have difficulty arising from chair, head 0 No down, impaired balance) Mental Status Oriented to own ability 0 No Electronic Signature(s) Signed: 03/02/2019 3:15:35 PM By: Rodell Perna Entered By: Rodell Perna on 03/02/2019 08:39:15 Nina Perez ([id number]) -------------------------------------------------------------------------------- Foot Assessment Details Patient Name: Nina Perez Date of Service: 03/02/2019 8:15 AM Medical Record Number: [id number] Patient Account Number: [id number] Date of Birth/Sex: [birth year] (78 y.o. F) Treating RN: Rodell Perna Primary Care Jeancarlos Marchena: Dale [place] Other Clinician: Referring Deserai Cansler: Referral, Self Treating Margarite Vessel/Extender: [employer] [place] in Treatment: 0 Foot Assessment Items Site Locations + = Sensation present, - = Sensation absent, C = Callus, U = Ulcer R = Redness, W = Warmth, M = Maceration, PU = Pre-ulcerative lesion F = Fissure, S = Swelling, D = Dryness Assessment Right: Left: Other Deformity: No No Prior Foot Ulcer: No No Prior Amputation: No No Charcot Joint: No No Ambulatory Status: Ambulatory With Help Assistance Device:  Cane Gait: Steady Electronic Signature(s) Signed: 03/02/2019 3:15:35 PM By: Rodell Perna Entered By: Rodell Perna on 03/02/2019 08:40:30 Nina Perez ([id number]) -------------------------------------------------------------------------------- Nutrition Risk Screening Details Patient Name: Nina Perez Date of Service: 03/02/2019 8:15 AM Medical Record Number: [id number] Patient Account Number: [id number] Date of Birth/Sex: 1940/06/01 (78 y.o. F) Treating RN: Rodell Perna Primary Care Prabhnoor Ellenberger: Dale [place] Other Clinician: Referring Sahara Fujimoto: Referral, Self Treating Myeshia Fojtik/Extender: [employer] [place] in Treatment: 0 Height (in): 66 Weight (lbs): 230 Body Mass Index (BMI): 37.1 Nutrition Risk Screening Items Score Screening NUTRITION RISK SCREEN: I have an illness or condition that made me change the kind and/or amount of 0 No food I eat I eat fewer than two meals per day 0 No I eat few fruits and vegetables, or milk products 0 No I have three or more drinks of beer, liquor or wine almost every day 0 No I have tooth or mouth problems that make it hard for me to eat 0 No I don't always have enough money to buy the food I need 0 No I eat alone most of the time 0 No I take three or more different prescribed or over-the-counter drugs a day 0 No Without wanting to, I have lost or gained 10 pounds in the last six months 0 No I am not always physically able to shop, cook and/or feed myself 0 No Nutrition Protocols Good Risk Protocol 0 No interventions needed Moderate Risk Protocol High Risk Proctocol Risk Level: Good Risk Score: 0 Electronic Signature(s) Signed: 03/02/2019 3:15:35 PM By: Rodell Perna Entered By: Rodell Perna on 03/02/2019 08:39:19

## 2019-03-03 NOTE — Progress Notes (Signed)
SHAURI, ELLINGHAM ([id number]) Visit Report for 03/02/2019 Chief Complaint Document Details Patient Name: Nina Perez, Nina Perez Date of Service: 03/02/2019 8:15 AM Medical Record Number: [id number] Patient Account Number: [id number] Date of Birth/Sex: 1940/05/23 (78 y.o. F) Treating RN: Cornell Barman Primary Care Provider: Einar Pheasant Other Clinician: Referring Provider: Referral, Self Treating Provider/Extender: Tito Dine in Treatment: 0 Information Obtained from: Patient Chief Complaint 03/02/2019; patient is here for review of the wound on her left lateral foot x6 weeks Electronic Signature(s) Signed: 03/02/2019 5:06:38 PM By: Linton Ham MD Entered By: Linton Ham on 03/02/2019 09:42:33 Nina Perez ([id number]) -------------------------------------------------------------------------------- Debridement Details Patient Name: Nina Perez Date of Service: 03/02/2019 8:15 AM Medical Record Number: [id number] Patient Account Number: [id number] Date of Birth/Sex: Aug 01, 1940 (78 y.o. F) Treating RN: Cornell Barman Primary Care Provider: Einar Pheasant Other Clinician: Referring Provider: Referral, Self Treating Provider/Extender: Tito Dine in Treatment: 0 Debridement Performed for Wound #1 Left,Lateral Foot Assessment: Performed By: Physician Ricard Dillon, MD Debridement Type: Debridement Severity of Tissue Pre Fat layer exposed Debridement: Level of Consciousness (Pre- Awake and Alert procedure): Pre-procedure Verification/Time Yes - 09:05 Out Taken: Start Time: 09:05 Pain Control: Lidocaine Total Area Debrided (L x W): 1.4 (cm) x 0.9 (cm) = 1.26 (cm) Tissue and other material Viable, Non-Viable, Eschar, Slough, Subcutaneous, Slough debrided: Level: Skin/Subcutaneous Tissue Debridement Description: Excisional Instrument: Curette Bleeding: Minimum Hemostasis Achieved: Pressure End Time: 09:07 Response to Treatment: Procedure was  tolerated well Level of Consciousness Awake and Alert (Post-procedure): Post Debridement Measurements of Total Wound Length: (cm) 1.4 Width: (cm) 1.2 Depth: (cm) 0.4 Volume: (cm) 0.528 Character of Wound/Ulcer Post Debridement: Requires Further Debridement Severity of Tissue Post Debridement: Fat layer exposed Post Procedure Diagnosis Same as Pre-procedure Electronic Signature(s) Signed: 03/02/2019 4:31:46 PM By: Gretta Cool, BSN, RN, CWS, Kim RN, BSN Signed: 03/02/2019 5:06:38 PM By: Linton Ham MD Entered By: Linton Ham on 03/02/2019 09:42:09 Nina Perez ([id number]) -------------------------------------------------------------------------------- HPI Details Patient Name: Nina Perez Date of Service: 03/02/2019 8:15 AM Medical Record Number: [id number] Patient Account Number: [id number] Date of Birth/Sex: 1940/09/28 (78 y.o. F) Treating RN: Cornell Barman Primary Care Provider: Einar Pheasant Other Clinician: Referring Provider: Referral, Self Treating Provider/Extender: Tito Dine in Treatment: 0 History of Present Illness HPI Description: ADMISSION 03/02/2019 [name] is a 78 year old woman with a complicated past medical history who presents for review of wound on the left lateral foot for the last 6 weeks. She states she simply looked down and saw a black spot on the left lateral foot and thought this was dirt went to wipe it off and there was an open wound. This is not gotten any better and over the timeframe. She has been using topical triple antibiotic ointment. She has a history of chronic venous insufficiency and has had wounds on her lower legs related to this but never on her feet. She is known to Dr. Aleda Grana at [facility] in [place] and she went to see him about this. He did not order any specific tests and said that he was not exactly sure what it because this per the patient's description. She was put in an [employer] but  it looked as though this was stopped on 10/8. She had a week's worth of doxycycline as well. Patient states the area is very painful rating it at a 10 out of 10. She does not really describe claudication element. Past medical history; scleroderma Crest syndromes, pulmonary hypertension, rheumatoid arthritis,  chronic hypoxemic respiratory failure, type 2 diabetes on oral agents hemoglobin A1c at 6.3, she has a history of skin cancer. The patient had venous studies on 06/16/2018 in the [facility] system. There was no thrombosis and they did not comment on reflux. She has had previous vein surgery ABIs in this clinic were 0.96 on the right and 0.88 on the left Electronic Signature(s) Signed: 03/02/2019 5:06:38 PM By: Linton Ham MD Entered By: Linton Ham on 03/02/2019 09:47:54 Nina Perez ([id number]) -------------------------------------------------------------------------------- Physical Exam Details Patient Name: Nina Perez Date of Service: 03/02/2019 8:15 AM Medical Record Number: [id number] Patient Account Number: [id number] Date of Birth/Sex: Oct 02, 1940 (78 y.o. F) Treating RN: Cornell Barman Primary Care Provider: Einar Pheasant Other Clinician: Referring Provider: Referral, Self Treating Provider/Extender: Tito Dine in Treatment: 0 Constitutional Patient is hypertensive.. Pulse regular and within target range for patient.Marland Kitchen Respirations regular, non-labored and within target range.. Temperature is normal and within the target range for the patient.Marland Kitchen appears in no distress. Eyes Conjunctivae clear. No discharge. Respiratory Respiratory effort is easy and symmetric bilaterally. Rate is normal at rest and on room air.. Bilateral breath sounds are clear and equal in all lobes with no wheezes, rales or rhonchi.. Cardiovascular Heart rhythm and rate regular, without murmur or gallop. JVP not elevated. Pedal pulses palpable and strong bilaterally.. No doubt the patient  has chronic venous hypertension with some degree of stasis dermatitis in the calfs. Perhaps an element of lymphedema as well.. Musculoskeletal No overt active synovitis. Integumentary (Hair, Skin) Other than skin changes in the bilateral lower extremities no other skin changes were seen. Neurological Sensation seem normal to the light touch/microfilament in the left foot both dorsally and plantar. Psychiatric No evidence of depression, anxiety, or agitation. Calm, cooperative, and communicative. Appropriate interactions and affect.. Notes Wound exam; the area in question is on the lateral left foot. Small area but exceedingly tender. Covered in nonviable necrotic tissue. We used topical lidocaine to attempt to anesthetize the area and the area was debrided with a #3 curette removing necrotic surface material. She tolerated this fairly well however I was still not able to get down to completely viable tissue. No cultures were felt to be necessary Electronic Signature(s) Signed: 03/02/2019 5:06:38 PM By: Linton Ham MD Entered By: Linton Ham on 03/02/2019 09:50:50 Nina Perez ([id number]) -------------------------------------------------------------------------------- Physician Orders Details Patient Name: Nina Perez Date of Service: 03/02/2019 8:15 AM Medical Record Number: [id number] Patient Account Number: [id number] Date of Birth/Sex: 21-Dec-1940 (78 y.o. F) Treating RN: Cornell Barman Primary Care Provider: Einar Pheasant Other Clinician: Referring Provider: Referral, Self Treating Provider/Extender: Tito Dine in Treatment: 0 Verbal / Phone Orders: No Diagnosis Coding Wound Cleansing Wound #1 Left,Lateral Foot o Clean wound with Normal Saline. o May Shower, gently pat wound dry prior to applying new dressing. Anesthetic (add to Medication List) Wound #1 Left,Lateral Foot o Topical Lidocaine 4% cream applied to wound bed prior to debridement (In  Clinic Only). Primary Wound Dressing Wound #1 Left,Lateral Foot o Santyl Ointment Secondary Dressing Wound #1 Left,Lateral Foot o ABD and Kerlix/Conform Dressing Change Frequency Wound #1 Left,Lateral Foot o Change dressing every day. Follow-up Appointments Wound #1 Left,Lateral Foot o Return Appointment in 1 week. Edema Control Wound #1 Left,Lateral Foot o Patient to wear own compression stockings o Elevate legs to the level of the heart and pump ankles as often as possible Off-Loading Wound #1 Left,Lateral Foot o Other: - keep pressure off of wound area Additional Orders /  Instructions Wound #1 Left,Lateral Foot o Activity as tolerated Medications-please add to medication list. Wound #1 Left,Lateral Foot ADAJAH, MAIETTA ([id number]) o Santyl Enzymatic Ointment Radiology o X-ray, foot - left Patient Medications Allergies: Maxidex Notifications Medication Indication Start End Santyl DOSE topical 250 unit/gram ointment - ointment topical Santyl 03/02/2019 DOSE topical 250 unit/gram ointment - ointment topical to wound change daily Electronic Signature(s) Signed: 03/02/2019 9:18:34 AM By: Linton Ham MD Entered By: Linton Ham on 03/02/2019 09:18:33 Nina Perez ([id number]) -------------------------------------------------------------------------------- Prescription 03/02/2019 Patient Name: Nina Perez Provider: Ricard Dillon MD Date of Birth: 10/31/1940 NPI#: [id number] Sex: F DEA#: [id number] Phone #: [phone] License #: [id number] Patient Address: [address] [place], [place] [address] [address], [place], [place] [address] [phone] Allergies Maxidex Reaction: nausea Medication Medication: Route: Strength: Form: Santyl 250 unit/gram topical topical 250 unit/gram ointment ointment Class: TOPICAL/MUCOUS MEMBR./SUBCUT.  ENZYMES Dose: Frequency / Time: Indication: ointment topical Number of Refills: Number of Units: 0 Generic Substitution: Start Date: End Date: One Time Use: Substitution Permitted No Note to Pharmacy: Signature(s): Date(s): Electronic Signature(s) Signed: 03/02/2019 5:06:38 PM By: Linton Ham MD Entered By: Linton Ham on 03/02/2019 09:18:37 Nina Perez ([id number]) --------------------------------------------------------------------------------  Problem List Details Patient Name: Nina Perez Date of Service: 03/02/2019 8:15 AM Medical Record Number: [id number] Patient Account Number: [id number] Date of Birth/Sex: [birth year] (78 y.o. F) Treating RN: Cornell Barman Primary Care Provider: Einar Pheasant Other Clinician: Referring Provider: Referral, Self Treating Provider/Extender: Tito Dine in Treatment: 0 Active Problems ICD-10 Evaluated Encounter Code Description Active Date Today Diagnosis E11.621 Type 2 diabetes mellitus with foot ulcer 03/02/2019 No Yes I87.323 Chronic venous hypertension (idiopathic) with inflammation of 03/02/2019 No Yes bilateral lower extremity L97.528 Non-pressure chronic ulcer of other part of left foot with other 03/02/2019 No Yes specified severity M05.7A Rheumatoid arthritis with rheumatoid factor of other specified 03/02/2019 No Yes site without organ or systems involvement Inactive Problems Resolved Problems Electronic Signature(s) Signed: 03/02/2019 5:06:38 PM By: Linton Ham MD Entered By: Linton Ham on 03/02/2019 09:41:45 Nina Perez ([id number]) -------------------------------------------------------------------------------- Progress Note Details Patient Name: Nina Perez Date of Service: 03/02/2019 8:15 AM Medical Record Number: [id number] Patient Account Number: [id number] Date of Birth/Sex: 04/20/1941 (78 y.o. F) Treating RN: Cornell Barman Primary Care Provider: Einar Pheasant Other  Clinician: Referring Provider: Referral, Self Treating Provider/Extender: Tito Dine in Treatment: 0 Subjective Chief Complaint Information obtained from Patient 03/02/2019; patient is here for review of the wound on her left lateral foot x6 weeks History of Present Illness (HPI) ADMISSION 03/02/2019 Mrs. Perusse is a 78 year old woman with a complicated past medical history who presents for review of wound on the left lateral foot for the last 6 weeks. She states she simply looked down and saw a black spot on the left lateral foot and thought this was dirt went to wipe it off and there was an open wound. This is not gotten any better and over the timeframe. She has been using topical triple antibiotic ointment. She has a history of chronic venous insufficiency and has had wounds on her lower legs related to this but never on her feet. She is known to Dr. Aleda Grana at [facility] in [place] and she went to see him about this. He did not order any specific tests and said that he was not exactly sure what it because this per the patient's description. She  was put in an [employer] but it looked as though this was stopped on 10/8. She had a week's worth of doxycycline as well. Patient states the area is very painful rating it at a 10 out of 10. She does not really describe claudication element. 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 The patient had venous studies on 06/16/2018 in the [facility] system. There was no thrombosis and they did not comment on reflux. She has had previous vein surgery ABIs in this clinic were 0.96 on the right and 0.88 on the left Patient History Information obtained from Patient. Allergies Maxidex (Reaction: nausea) Family History Heart Disease - Father, No  family history of Cancer, Diabetes, Hypertension, Kidney Disease, Lung Disease, Seizures, Stroke, Thyroid Problems, Tuberculosis. Social History Former smoker - 30 years ago, Marital Status - Married, Alcohol Use - Never, Drug Use - No History, Caffeine Use - Daily. Medical History Eyes Denies history of Cataracts, Glaucoma, Optic Neuritis Ear/Nose/Mouth/Throat TAVITA, ORIANS ([id number]) Denies history of Chronic sinus problems/congestion, Middle ear problems Hematologic/Lymphatic Denies history of Anemia, Hemophilia, Human Immunodeficiency Virus, Lymphedema, Sickle Cell Disease Respiratory Denies history of Aspiration, Asthma, Chronic Obstructive Pulmonary Disease (COPD), Pneumothorax, Sleep Apnea, Tuberculosis Cardiovascular Patient has history of Hypertension Denies history of Angina, Arrhythmia, Congestive Heart Failure, Coronary Artery Disease, Deep Vein Thrombosis, Hypotension, Myocardial Infarction, Peripheral Arterial Disease, Peripheral Venous Disease, Phlebitis, Vasculitis Gastrointestinal Denies history of Cirrhosis , Colitis, Crohn s, Hepatitis A, Hepatitis B, Hepatitis C Endocrine Patient has history of Type II Diabetes Genitourinary Denies history of End Stage Renal Disease Immunological Patient has history of Scleroderma Denies history of Lupus Erythematosus, Raynaud s Integumentary (Skin) Denies history of History of Burn, History of pressure wounds Musculoskeletal Patient has history of Gout, Rheumatoid Arthritis Denies history of Osteoarthritis, Osteomyelitis Neurologic Denies history of Dementia, Neuropathy, Quadriplegia, Paraplegia, Seizure Disorder Oncologic Denies history of Received Chemotherapy, Received Radiation Psychiatric Denies history of Anorexia/bulimia, Confinement Anxiety Patient is treated with Oral Agents. Review of Systems (ROS) Constitutional Symptoms (General Health) Denies complaints or symptoms of Fatigue, Fever, Chills, Marked Weight  Change. Eyes Complains or has symptoms of Glasses / Contacts - glasses. Denies complaints or symptoms of Vision Changes. Ear/Nose/Mouth/Throat Denies complaints or symptoms of Difficult clearing ears, Sinusitis. Hematologic/Lymphatic Denies complaints or symptoms of Bleeding / Clotting Disorders, Human Immunodeficiency Virus. Respiratory Denies complaints or symptoms of Chronic or frequent coughs, Shortness of Breath. Cardiovascular Denies complaints or symptoms of Chest pain, LE edema. Gastrointestinal Denies complaints or symptoms of Frequent diarrhea, Nausea, Vomiting. Endocrine Denies complaints or symptoms of Hepatitis, Thyroid disease, Polydypsia (Excessive Thirst). Genitourinary Denies complaints or symptoms of Kidney failure/ Dialysis, Incontinence/dribbling. Immunological Denies complaints or symptoms of Hives, Itching. Integumentary (Skin) Complains or has symptoms of Wounds. Denies complaints or symptoms of Bleeding or bruising tendency, Breakdown, Swelling. Musculoskeletal Denies complaints or symptoms of Muscle Pain, Muscle Weakness. HADY, MCFALLS TMarland Kitchen ([id number]) Neurologic Denies complaints or symptoms of Numbness/parasthesias, Focal/Weakness. Oncologic skin cancer Psychiatric Denies complaints or symptoms of Anxiety, Claustrophobia. Objective Constitutional Patient is hypertensive.. Pulse regular and within target range for patient.Marland Kitchen Respirations regular, non-labored and within target range.. Temperature is normal and within the target range for the patient.Marland Kitchen appears in no distress. Vitals Time Taken: 8:32 AM, Height: 66 in, Source: Stated, Weight: 230 lbs, Source: Stated, BMI: 37.1, Temperature: 98.9 F, Pulse: 81 bpm, Respiratory Rate: 16 breaths/min, Blood  Pressure: 150/78 mmHg. Eyes Conjunctivae clear. No discharge. Respiratory Respiratory effort is easy and symmetric bilaterally. Rate is normal at rest and on room air.. Bilateral breath sounds are clear and  equal in all lobes with no wheezes, rales or rhonchi.. Cardiovascular Heart rhythm and rate regular, without murmur or gallop. JVP not elevated. Pedal pulses palpable and strong bilaterally.. No doubt the patient has chronic venous hypertension with some degree of stasis dermatitis in the calfs. Perhaps an element of lymphedema as well.. Musculoskeletal No overt active synovitis. Neurological Sensation seem normal to the light touch/microfilament in the left foot both dorsally and plantar. Psychiatric No evidence of depression, anxiety, or agitation. Calm, cooperative, and communicative. Appropriate interactions and affect.. General Notes: Wound exam; the area in question is on the lateral left foot. Small area but exceedingly tender. Covered in nonviable necrotic tissue. We used topical lidocaine to attempt to anesthetize the area and the area was debrided with a #3 curette removing necrotic surface material. She tolerated this fairly well however I was still not able to get down to completely viable tissue. No cultures were felt to be necessary Integumentary (Hair, Skin) Other than skin changes in the bilateral lower extremities no other skin changes were seen. Wound #1 status is Open. Original cause of wound was Gradually Appeared. The wound is located on the Left,Lateral Foot. The wound measures 1.4cm length x 0.9cm width x 0.2cm depth; 0.99cm^2 area and 0.198cm^3 volume. There is Fat Layer (Subcutaneous Tissue) Exposed exposed. There is no tunneling or undermining noted. There is a medium amount of serosanguineous drainage noted. There is small (1-33%) red granulation within the wound bed. There is a large (67-100%) Ngu, Aailyah T. ([id number]) amount of necrotic tissue within the wound bed including Eschar and Adherent Slough. Assessment Active Problems ICD-10 Type 2 diabetes mellitus with foot ulcer Chronic venous hypertension (idiopathic) with inflammation of bilateral lower  extremity Non-pressure chronic ulcer of other part of left foot with other specified severity Rheumatoid arthritis with rheumatoid factor of other specified site without organ or systems involvement Procedures Wound #1 Pre-procedure diagnosis of Wound #1 is a Diabetic Wound/Ulcer of the Lower Extremity located on the Left,Lateral Foot .Severity of Tissue Pre Debridement is: Fat layer exposed. There was a Excisional Skin/Subcutaneous Tissue Debridement with a total area of 1.26 sq cm performed by Ricard Dillon, MD. With the following instrument(s): Curette to remove Viable and Non-Viable tissue/material. Material removed includes Eschar, Subcutaneous Tissue, and Slough after achieving pain control using Lidocaine. No specimens were taken. A time out was conducted at 09:05, prior to the start of the procedure. A Minimum amount of bleeding was controlled with Pressure. The procedure was tolerated well. Post Debridement Measurements: 1.4cm length x 1.2cm width x 0.4cm depth; 0.528cm^3 volume. Character of Wound/Ulcer Post Debridement requires further debridement. Severity of Tissue Post Debridement is: Fat layer exposed. Post procedure Diagnosis Wound #1: Same as Pre-Procedure Plan Wound Cleansing: Wound #1 Left,Lateral Foot: Clean wound with Normal Saline. May Shower, gently pat wound dry prior to applying new dressing. Anesthetic (add to Medication List): Wound #1 Left,Lateral Foot: Topical Lidocaine 4% cream applied to wound bed prior to debridement (In Clinic Only). Primary Wound Dressing: Wound #1 Left,Lateral Foot: Santyl Ointment Secondary Dressing: Wound #1 Left,Lateral Foot: ABD and Kerlix/Conform Dressing Change Frequency: Wound #1 Left,Lateral Foot: Change dressing every day. Follow-up Appointments: ARELENE, CANTOR ([id number]) Wound #1 Left,Lateral Foot: Return Appointment in 1 week. Edema Control: Wound #1 Left,Lateral Foot: Patient to wear own compression  stockings Elevate legs to the level of the heart and pump ankles as often as possible Off-Loading: Wound #1 Left,Lateral Foot: Other: - keep pressure off of wound area Additional Orders / Instructions: Wound #1 Left,Lateral Foot: Activity as tolerated Medications-please add to medication list.: Wound #1 Left,Lateral Foot: Santyl Enzymatic Ointment Radiology ordered were: X-ray, foot - left The following medication(s) was prescribed: Santyl topical 250 unit/gram ointment ointment topical Santyl topical 250 unit/gram ointment ointment topical to wound change daily starting 03/02/2019 1. The exact cause of this small punched out painful area on her left lateral foot is not totally clear. Does not have the typical location or appearance of the venous ulcer although certainly this comes into the differential. 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 2. After a reasonably aggressive debridement today I still was not able to get down to a viable surface here. I prescribed Santyl to be applied daily to this wound. For this purpose I am going to not wrap the leg and stated she could continue with her compression stocking. 3. Although her ABIs were normal in this clinic in a diabetic it is not impossible to have normal ABIs and yet clinically clinically significant microvascular disease. She may need formal studies including TBI's. 4. Although the exact cause of this wound was not clear standard wound care principles apply including wound bed preparation, pressure relief including leaving this open and foot wear. They are trying to take care of this. 5. I did not see any current evidence of infection. She has completed her doxycycline. I did not do any wound cultures here although I thought an x-ray of her foot was probably indicated for a prolonged wound in a diabetic foot 6. I hope she continues to follow with Dr. Aleda Grana specifically to  address any coexistent venous hypertension issues Electronic Signature(s) Signed: 03/02/2019 5:06:38 PM By: Linton Ham MD Entered By: Linton Ham on 03/02/2019 09:54:34 Nina Perez ([id number]) -------------------------------------------------------------------------------- ROS/PFSH Details Patient Name: Nina Perez Date of Service: 03/02/2019 8:15 AM Medical Record Number: [id number] Patient Account Number: [id number] Date of Birth/Sex: [birth year] (78 y.o. F) Treating RN: Army Melia Primary Care Provider: Einar Pheasant Other Clinician: Referring Provider: Referral, Self Treating Provider/Extender: Tito Dine in Treatment: 0 Information Obtained From Patient Constitutional Symptoms (General Health) Complaints and Symptoms: Negative for: Fatigue; Fever; Chills; Marked Weight Change Eyes Complaints and Symptoms: Positive for: Glasses / Contacts - glasses Negative for: Vision Changes Medical History: Negative for: Cataracts; Glaucoma; Optic Neuritis Ear/Nose/Mouth/Throat Complaints and Symptoms: Negative for: Difficult clearing ears; Sinusitis Medical History: Negative for: Chronic sinus problems/congestion; Middle ear problems Hematologic/Lymphatic Complaints and Symptoms: Negative for: Bleeding / Clotting Disorders; Human Immunodeficiency Virus Medical History: Negative for: Anemia; Hemophilia; Human Immunodeficiency Virus; Lymphedema; Sickle Cell Disease Respiratory Complaints and Symptoms: Negative for: Chronic or frequent coughs; Shortness of Breath Medical History: Negative for: Aspiration; Asthma; Chronic Obstructive Pulmonary Disease (COPD); Pneumothorax; Sleep Apnea; Tuberculosis Cardiovascular Complaints and Symptoms: Negative for: Chest pain; LE edema Medical History: Positive for: Hypertension Negative for: Angina; Arrhythmia; Congestive Heart Failure; Coronary Artery Disease; Deep Vein Thrombosis; Hypotension; TORRYN, HUNTING  ([id number]) Myocardial Infarction; Peripheral Arterial Disease; Peripheral Venous Disease; Phlebitis; Vasculitis Gastrointestinal Complaints and Symptoms: Negative for: Frequent diarrhea; Nausea; Vomiting Medical History: Negative for: Cirrhosis ; Colitis; Crohnos; Hepatitis A; Hepatitis B; Hepatitis C Endocrine Complaints and Symptoms: Negative for: Hepatitis; Thyroid disease; Polydypsia (Excessive Thirst) Medical History: Positive for: Type II Diabetes Treated with: Oral agents Genitourinary Complaints and  Symptoms: Negative for: Kidney failure/ Dialysis; Incontinence/dribbling Medical History: Negative for: End Stage Renal Disease Immunological Complaints and Symptoms: Negative for: Hives; Itching Medical History: Positive for: Scleroderma Negative for: Lupus Erythematosus; Raynaudos Integumentary (Skin) Complaints and Symptoms: Positive for: Wounds Negative for: Bleeding or bruising tendency; Breakdown; Swelling Medical History: Negative for: History of Burn; History of pressure wounds Musculoskeletal Complaints and Symptoms: Negative for: Muscle Pain; Muscle Weakness Medical History: Positive for: Gout; Rheumatoid Arthritis Negative for: Osteoarthritis; Osteomyelitis Neurologic Complaints and Symptoms: Negative for: Numbness/parasthesias; Focal/Weakness Broadwater, Cowgill T. ([id number]) Medical History: Negative for: Dementia; Neuropathy; Quadriplegia; Paraplegia; Seizure Disorder Psychiatric Complaints and Symptoms: Negative for: Anxiety; Claustrophobia Medical History: Negative for: Anorexia/bulimia; Confinement Anxiety Oncologic Complaints and Symptoms: Review of System Notes: skin cancer Medical History: Negative for: Received Chemotherapy; Received Radiation Immunizations Pneumococcal Vaccine: Received Pneumococcal Vaccination: Yes Implantable Devices None Family and Social History Cancer: No; Diabetes: No; Heart Disease: Yes - Father; Hypertension: No;  Kidney Disease: No; Lung Disease: No; Seizures: No; Stroke: No; Thyroid Problems: No; Tuberculosis: No; Former smoker - 30 years ago; Marital Status - Married; Alcohol Use: Never; Drug Use: No History; Caffeine Use: Daily; Financial Concerns: No; Food, Clothing or Shelter Needs: No; Support System Lacking: No; Transportation Concerns: No Electronic Signature(s) Signed: 03/02/2019 3:15:35 PM By: Army Melia Signed: 03/02/2019 4:31:46 PM By: Gretta Cool, BSN, RN, CWS, Kim RN, BSN Signed: 03/02/2019 5:06:38 PM By: Linton Ham MD Entered By: Gretta Cool, BSN, RN, CWS, Kim on 03/02/2019 09:13:50 Nina Perez ([id number]) -------------------------------------------------------------------------------- [place] Details Patient Name: Nina Perez Date of Service: 03/02/2019 Medical Record Number: [id number] Patient Account Number: [id number] Date of Birth/Sex: August 25, 1940 (78 y.o. 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 M05.7A Rheumatoid arthritis with rheumatoid factor of other specified site without organ or systems involvement Facility Procedures CPT4 Code Description: [id number] - WOUND CARE VISIT-LEV 3 EST PT Modifier: Quantity: 1 CPT4 Code Description: [id number] - DEB SUBQ TISSUE 20 SQ CM/< ICD-10 Diagnosis Description L97.528 Non-pressure chronic ulcer of other part of left foot with other Modifier: specified severi Quantity: 1 ty Physician Procedures CPT4 Code Description: [id number] WC PHYS LEVEL 3 o NEW PT ICD-10 Diagnosis Description L97.528 Non-pressure  chronic ulcer of other part of left foot with other E11.621 Type 2 diabetes mellitus with foot ulcer I87.323 Chronic venous hypertension  (idiopathic) with inflammation of bi Modifier: 25 specified severit lateral lower extr Quantity: 1 y emity CPT4 Code Description: [id number] - WC PHYS SUBQ TISS 20 SQ CM ICD-10 Diagnosis Description L97.528 Non-pressure chronic ulcer of other part of left foot with other Modifier: specified severit Quantity: 1 y Engineer, maintenance) Signed: 03/02/2019 5:06:38 PM By: Linton Ham MD Entered By: Linton Ham on 03/02/2019 09:55:21

## 2019-03-03 NOTE — Progress Notes (Signed)
PM By: Gretta Cool, BSN, RN, CWS, Kim RN, BSN Entered By: Gretta Cool, BSN, RN, CWS, Kim on 03/02/2019 09:12:02 Nina Perez ([id number]) -------------------------------------------------------------------------------- Encounter Discharge Information Details Patient Name: Nina Perez Date of Service: 03/02/2019 8:15 AM Medical Record Number: [id number] Patient Account Number: [id number] Date of Birth/Sex: 02/22/1941 (78 y.o. F) Treating RN: Cornell Barman Primary Care Sivan Cuello: Einar Pheasant Other Clinician: Referring Shavana Calder: Referral, Self Treating Koree Schopf/Extender: Tito Dine in Treatment: 0 Encounter Discharge Information Items Post Procedure Vitals Discharge Condition: Stable Temperature (F): 98.9 Ambulatory Status: Ambulatory Pulse (bpm): 81 Discharge Destination: Home Respiratory Rate (breaths/min): 16 Transportation: Private Auto Blood Pressure (mmHg): 150/78 Accompanied By: self Schedule Follow-up Appointment: Yes Clinical Summary of Care: Electronic Signature(s) Signed: 03/02/2019 4:31:46 PM By: Gretta Cool, BSN, RN, CWS, Kim RN, BSN Entered By: Gretta Cool, BSN, RN, CWS, Kim on 03/02/2019 09:14:54 Nina Perez ([id number]) -------------------------------------------------------------------------------- Lower Extremity Assessment Details Patient Name: Nina Perez Date of Service: 03/02/2019 8:15 AM Medical Record Number: [id number] Patient Account Number: [id number] Date of Birth/Sex: [birth year] (78 y.o. F) Treating RN: Army Melia Primary Care Denzil Mceachron: Einar Pheasant Other Clinician: Referring Juliette Standre: Referral, Self Treating Eveny Anastas/Extender: Tito Dine in Treatment: 0 Edema  Assessment Assessed: [Left: No] [Right: No] Edema: [Left: No] [Right: No] Calf Left: Right: Point of Measurement: 34 cm From Medial Instep 41.5 cm 42 cm Ankle Left: Right: Point of Measurement: 10 cm From Medial Instep 23 cm 24 cm Vascular Assessment Pulses: Dorsalis Pedis Palpable: [Left:Yes] [Right:Yes] Doppler Audible: [Left:Yes] [Right:Yes] Posterior Tibial Palpable: [Left:Yes] [Right:Yes] Doppler Audible: [Left:Yes] [Right:Inaudible] Blood Pressure: Brachial: [Left:150] [Right:150] Ankle: [Left:Posterior Tibial: 132 0.88] [Right:Dorsalis Pedis: 144 0.96] Notes pulse non-audible on the left dorsalis and the right PT Electronic Signature(s) Signed: 03/02/2019 3:15:35 PM By: Army Melia Entered By: Army Melia on 03/02/2019 08:50:56 Nina Perez ([id number]) -------------------------------------------------------------------------------- Multi Wound Chart Details Patient Name: Nina Perez Date of Service: 03/02/2019 8:15 AM Medical Record Number: [id number] Patient Account Number: [id number] Date of Birth/Sex: 12-Mar-1941 (78 y.o. F) Treating RN: Cornell Barman Primary Care Emmaly Leech: Einar Pheasant Other Clinician: Referring T[place]: Referral, Self Treating Menachem Urbanek/Extender: Tito Dine in Treatment: 0 Vital Signs Height(in): 66 Pulse(bpm): 81 Weight(lbs): 230 Blood Pressure(mmHg): 150/78 Body Mass Index(BMI): 37 Temperature(F): 98.9 Respiratory Rate 16 (breaths/min): Photos: [N/A:N/A] Wound Location: Left Foot - Lateral N/A N/A Wounding Event: Gradually Appeared N/A N/A Primary Etiology: Diabetic Wound/Ulcer of the N/A N/A Lower Extremity Comorbid History: Hypertension, Type II N/A N/A Diabetes, Gout, Rheumatoid Arthritis Date Acquired: 01/17/2019 N/A N/A Weeks of Treatment: 0 N/A N/A Wound Status: Open N/A N/A Measurements L x W x D 1.4x0.9x0.2 N/A N/A (cm) Area (cm) : 0.99 N/A N/A Volume (cm) : 0.198 N/A N/A Classification: Grade 1  N/A N/A Exudate Amount: Medium N/A N/A Exudate Type: Serosanguineous N/A N/A Exudate Color: red, brown N/A N/A Granulation Amount: Small (1-33%) N/A N/A Granulation Quality: Red N/A N/A Necrotic Amount: Large (67-100%) N/A N/A Necrotic Tissue: Eschar, Adherent Slough N/A N/A Exposed Structures: Fat Layer (Subcutaneous N/A N/A Tissue) Exposed: Yes Fascia: No Tendon: No Muscle: No Joint: No Bone: No ANICE, GANNETT T. ([id number]) Epithelialization: None N/A N/A Debridement: Debridement - Excisional N/A N/A Pre-procedure 09:05 N/A N/A Verification/Time Out Taken: Pain Control: Lidocaine N/A N/A Tissue Debrided: Necrotic/Eschar, N/A N/A Subcutaneous, Slough Level: Skin/Subcutaneous Tissue N/A N/A Debridement Area (sq cm): 1.26 N/A N/A Instrument: Curette N/A N/A Bleeding: Minimum N/A N/A Hemostasis Achieved: Pressure N/A N/A Debridement Treatment Procedure was tolerated well N/A N/A Response: Post Debridement  1.4x1.2x0.4 N/A N/A Measurements L x W x D (cm) Post Debridement Volume: 0.528 N/A N/A (cm) Procedures Performed: Debridement N/A N/A Treatment Notes Wound #1 (Left, Lateral Foot) Notes santyl, abd, conform Electronic Signature(s) Signed: 03/02/2019 5:06:38 PM By: Linton Ham MD Entered By: Linton Ham on 03/02/2019 09:41:57 Nina Perez ([id number]) -------------------------------------------------------------------------------- [place] Details Patient Name: Nina Perez Date of Service: 03/02/2019 8:15 AM Medical Record Number: [id number] Patient Account Number: [id number] Date of Birth/Sex: Sep 22, 1940 (78 y.o. F) Treating RN: Cornell Barman Primary Care Omaria Plunk: Einar Pheasant Other Clinician: Referring Reyonna Haack: Referral, Self Treating Amyr Sluder/Extender: Tito Dine in Treatment: 0 Active Inactive Abuse / Safety / Falls / Self Care Management Nursing Diagnoses: Impaired physical mobility Potential for  falls Goals: Patient will remain injury free related to falls Date Initiated: 03/02/2019 Target Resolution Date: 03/16/2019 Goal Status: Active Interventions: Assess fall risk on admission and as needed Notes: Medication Nursing Diagnoses: Knowledge deficit related to medication safety: actual or potential Goals: Patient/caregiver will demonstrate understanding of all current medications Date Initiated: 03/02/2019 Target Resolution Date: 03/17/2019 Goal Status: Active Interventions: Assess for medication contraindications each visit where new medications are prescribed Notes: Necrotic Tissue Nursing Diagnoses: Impaired tissue integrity related to necrotic/devitalized tissue Goals: Necrotic/devitalized tissue will be minimized in the wound bed Date Initiated: 03/02/2019 Target Resolution Date: 03/17/2019 Goal Status: Active Interventions: JODEE, BISHARA ([id number]) Assess patient pain level pre-, during and post procedure and prior to discharge Treatment Activities: Apply topical anesthetic as ordered : 03/02/2019 Notes: Orientation to the Wound Care Program Nursing Diagnoses: Knowledge deficit related to the wound healing center program Goals: Patient/caregiver will verbalize understanding of the [facility] Date Initiated: 03/02/2019 Target Resolution Date: 03/17/2019 Goal Status: Active Interventions: Provide education on orientation to the wound center Notes: Venous Leg Ulcer Nursing Diagnoses: Actual venous Insuffiency (use after diagnosis is confirmed) Goals: Patient/caregiver will verbalize understanding of disease process and disease management Date Initiated: 03/02/2019 Target Resolution Date: 03/17/2019 Goal Status: Active Verify adequate tissue perfusion prior to therapeutic compression application Date Initiated: 03/02/2019 Target Resolution Date: 03/17/2019 Goal Status: Active Interventions: Assess peripheral edema status every  visit. Provide education on venous insufficiency Notes: Wound/Skin Impairment Nursing Diagnoses: Impaired tissue integrity Goals: Ulcer/skin breakdown will have a volume reduction of 30% by week 4 Date Initiated: 03/02/2019 Target Resolution Date: 04/02/2019 Goal Status: Active Interventions: Assess ulceration(s) every visit POORVI, KAPALA ([id number]) Treatment Activities: Topical wound management initiated : 03/02/2019 Notes: Electronic Signature(s) Signed: 03/02/2019 4:31:46 PM By: Gretta Cool, BSN, RN, CWS, Kim RN, BSN Entered By: Gretta Cool, BSN, RN, CWS, Kim on 03/02/2019 08:59:38 Nina Perez ([id number]) -------------------------------------------------------------------------------- Pain Assessment Details Patient Name: Nina Perez Date of Service: 03/02/2019 8:15 AM Medical Record Number: [id number] Patient Account Number: [id number] Date of Birth/Sex: 11/27/1940 (78 y.o. F) Treating RN: Army Melia Primary Care Tsutomu Barfoot: Einar Pheasant Other Clinician: Referring Artemisia Auvil: Referral, Self Treating Carlissa Pesola/Extender: Tito Dine in Treatment: 0 Active Problems Location of Pain Severity and Description of Pain Patient Has Paino Yes Site Locations Pain Location: Pain in Ulcers Rate the pain. Current Pain Level: 5 Pain Management and Medication Current Pain Management: Electronic Signature(s) Signed: 03/02/2019 3:15:35 PM By: Army Melia Entered By: Army Melia on 03/02/2019 08:32:06 Nina Perez ([id number]) -------------------------------------------------------------------------------- Patient/Caregiver Education Details Patient Name: Nina Perez Date of Service: 03/02/2019 8:15 AM Medical Record Number: [id number] Patient Account Number: [id number] Date of Birth/Gender: January 12, 1941 (78 y.o. F) Treating RN: Cornell Barman Primary Care Physician: Einar Pheasant  SAMIE, FLEGEL ([id number]) Visit Report for 03/02/2019 Allergy List Details Patient Name: HADAR, FOLLMER Date of Service: 03/02/2019 8:15 AM Medical Record Number: [id number] Patient Account Number: [id number] Date of Birth/Sex: Nov 15, 1940 (78 y.o. F) Treating RN: Army Melia Primary Care Yesmin Mutch: Einar Pheasant Other Clinician: Referring Satoshi Kalas: Referral, Self Treating Tequlia Gonsalves/Extender: Ricard Dillon Weeks in Treatment: 0 Allergies Active Allergies Maxidex Reaction: nausea Allergy Notes Electronic Signature(s) Signed: 03/02/2019 3:15:35 PM By: Army Melia Entered By: Army Melia on 03/02/2019 08:33:29 Nina Perez ([id number]) -------------------------------------------------------------------------------- Arrival Information Details Patient Name: Nina Perez Date of Service: 03/02/2019 8:15 AM Medical Record Number: [id number] Patient Account Number: [id number] Date of Birth/Sex: [birth year] (78 y.o. F) Treating RN: Army Melia Primary Care Joclynn Lumb: Einar Pheasant Other Clinician: Referring Cacie Gaskins: Referral, Self Treating Delron Comer/Extender: Tito Dine in Treatment: 0 Visit Information Patient Arrived: Wheel Chair Arrival Time: 08:28 Accompanied By: daughter Transfer Assistance: None Patient Identification Verified: Yes Electronic Signature(s) Signed: 03/02/2019 3:15:35 PM By: Army Melia Entered By: Army Melia on 03/02/2019 08:28:44 Nina Perez ([id number]) -------------------------------------------------------------------------------- Clinic Level of Care Assessment Details Patient Name: Nina Perez Date of Service: 03/02/2019 8:15 AM Medical Record Number: [id number] Patient Account Number: [id number] Date of Birth/Sex: July 22, 1940 (78 y.o. F) Treating RN: Cornell Barman Primary Care Brinson Tozzi: Einar Pheasant Other Clinician: Referring Delphin Funes: Referral, Self Treating Erico Stan/Extender: Tito Dine in Treatment:  0 Clinic Level of Care Assessment Items TOOL 1 Quantity Score []  - Use when EandM and Procedure is performed on INITIAL visit 0 ASSESSMENTS - Nursing Assessment / Reassessment X - General Physical Exam (combine w/ comprehensive assessment (listed just below) when 1 20 performed on new pt. evals) X- 1 25 Comprehensive Assessment (HX, ROS, Risk Assessments, Wounds Hx, etc.) ASSESSMENTS - Wound and Skin Assessment / Reassessment []  - Dermatologic / Skin Assessment (not related to wound area) 0 ASSESSMENTS - Ostomy and/or Continence Assessment and Care []  - Incontinence Assessment and Management 0 []  - 0 Ostomy Care Assessment and Management (repouching, etc.) PROCESS - Coordination of Care X - Simple Patient / Family Education for ongoing care 1 15 []  - 0 Complex (extensive) Patient / Family Education for ongoing care X- 1 10 Staff obtains Programmer, systems, Records, Test Results / Process Orders []  - 0 Staff telephones HHA, Nursing Homes / Clarify orders / etc []  - 0 Routine Transfer to another Facility (non-emergent condition) []  - 0 Routine Hospital Admission (non-emergent condition) []  - 0 New Admissions / Biomedical engineer / Ordering NPWT, Apligraf, etc. []  - 0 Emergency Hospital Admission (emergent condition) PROCESS - Special Needs []  - Pediatric / Minor Patient Management 0 []  - 0 Isolation Patient Management []  - 0 Hearing / Language / Visual special needs []  - 0 Assessment of Community assistance (transportation, D/C planning, etc.) []  - 0 Additional assistance / Altered mentation []  - 0 Support Surface(s) Assessment (bed, cushion, seat, etc.) JALYSE, MEERSMAN T. ([id number]) INTERVENTIONS - Miscellaneous []  - External ear exam 0 []  - 0 Patient Transfer (multiple staff / Civil Service fast streamer / Similar devices) []  - 0 Simple Staple / Suture removal (25 or less) []  - 0 Complex Staple / Suture removal (26 or more) []  - 0 Hypo/Hyperglycemic Management (do not check if billed  separately) X- 1 15 Ankle / Brachial Index (ABI) - do not check if billed separately Has the patient been seen at the hospital within the last three years: Yes Total Score: 85 Level Of Care: New/Established - Level 3 Electronic Signature(s) Signed: 03/02/2019 4:31:46  1.4x1.2x0.4 N/A N/A Measurements L x W x D (cm) Post Debridement Volume: 0.528 N/A N/A (cm) Procedures Performed: Debridement N/A N/A Treatment Notes Wound #1 (Left, Lateral Foot) Notes santyl, abd, conform Electronic Signature(s) Signed: 03/02/2019 5:06:38 PM By: Linton Ham MD Entered By: Linton Ham on 03/02/2019 09:41:57 Nina Perez ([id number]) -------------------------------------------------------------------------------- [place] Details Patient Name: Nina Perez Date of Service: 03/02/2019 8:15 AM Medical Record Number: [id number] Patient Account Number: [id number] Date of Birth/Sex: Sep 22, 1940 (78 y.o. F) Treating RN: Cornell Barman Primary Care Omaria Plunk: Einar Pheasant Other Clinician: Referring Reyonna Haack: Referral, Self Treating Amyr Sluder/Extender: Tito Dine in Treatment: 0 Active Inactive Abuse / Safety / Falls / Self Care Management Nursing Diagnoses: Impaired physical mobility Potential for  falls Goals: Patient will remain injury free related to falls Date Initiated: 03/02/2019 Target Resolution Date: 03/16/2019 Goal Status: Active Interventions: Assess fall risk on admission and as needed Notes: Medication Nursing Diagnoses: Knowledge deficit related to medication safety: actual or potential Goals: Patient/caregiver will demonstrate understanding of all current medications Date Initiated: 03/02/2019 Target Resolution Date: 03/17/2019 Goal Status: Active Interventions: Assess for medication contraindications each visit where new medications are prescribed Notes: Necrotic Tissue Nursing Diagnoses: Impaired tissue integrity related to necrotic/devitalized tissue Goals: Necrotic/devitalized tissue will be minimized in the wound bed Date Initiated: 03/02/2019 Target Resolution Date: 03/17/2019 Goal Status: Active Interventions: JODEE, BISHARA ([id number]) Assess patient pain level pre-, during and post procedure and prior to discharge Treatment Activities: Apply topical anesthetic as ordered : 03/02/2019 Notes: Orientation to the Wound Care Program Nursing Diagnoses: Knowledge deficit related to the wound healing center program Goals: Patient/caregiver will verbalize understanding of the [facility] Date Initiated: 03/02/2019 Target Resolution Date: 03/17/2019 Goal Status: Active Interventions: Provide education on orientation to the wound center Notes: Venous Leg Ulcer Nursing Diagnoses: Actual venous Insuffiency (use after diagnosis is confirmed) Goals: Patient/caregiver will verbalize understanding of disease process and disease management Date Initiated: 03/02/2019 Target Resolution Date: 03/17/2019 Goal Status: Active Verify adequate tissue perfusion prior to therapeutic compression application Date Initiated: 03/02/2019 Target Resolution Date: 03/17/2019 Goal Status: Active Interventions: Assess peripheral edema status every  visit. Provide education on venous insufficiency Notes: Wound/Skin Impairment Nursing Diagnoses: Impaired tissue integrity Goals: Ulcer/skin breakdown will have a volume reduction of 30% by week 4 Date Initiated: 03/02/2019 Target Resolution Date: 04/02/2019 Goal Status: Active Interventions: Assess ulceration(s) every visit POORVI, KAPALA ([id number]) Treatment Activities: Topical wound management initiated : 03/02/2019 Notes: Electronic Signature(s) Signed: 03/02/2019 4:31:46 PM By: Gretta Cool, BSN, RN, CWS, Kim RN, BSN Entered By: Gretta Cool, BSN, RN, CWS, Kim on 03/02/2019 08:59:38 Nina Perez ([id number]) -------------------------------------------------------------------------------- Pain Assessment Details Patient Name: Nina Perez Date of Service: 03/02/2019 8:15 AM Medical Record Number: [id number] Patient Account Number: [id number] Date of Birth/Sex: 11/27/1940 (78 y.o. F) Treating RN: Army Melia Primary Care Tsutomu Barfoot: Einar Pheasant Other Clinician: Referring Artemisia Auvil: Referral, Self Treating Carlissa Pesola/Extender: Tito Dine in Treatment: 0 Active Problems Location of Pain Severity and Description of Pain Patient Has Paino Yes Site Locations Pain Location: Pain in Ulcers Rate the pain. Current Pain Level: 5 Pain Management and Medication Current Pain Management: Electronic Signature(s) Signed: 03/02/2019 3:15:35 PM By: Army Melia Entered By: Army Melia on 03/02/2019 08:32:06 Nina Perez ([id number]) -------------------------------------------------------------------------------- Patient/Caregiver Education Details Patient Name: Nina Perez Date of Service: 03/02/2019 8:15 AM Medical Record Number: [id number] Patient Account Number: [id number] Date of Birth/Gender: January 12, 1941 (78 y.o. F) Treating RN: Cornell Barman Primary Care Physician: Einar Pheasant

## 2019-03-09 ENCOUNTER — Encounter: Payer: Medicare Other | Admitting: Internal Medicine

## 2019-03-09 ENCOUNTER — Other Ambulatory Visit: Payer: Self-pay

## 2019-03-09 ENCOUNTER — Other Ambulatory Visit: Payer: Self-pay | Admitting: Internal Medicine

## 2019-03-09 DIAGNOSIS — E11621 Type 2 diabetes mellitus with foot ulcer: Secondary | ICD-10-CM | POA: Diagnosis not present

## 2019-03-10 NOTE — Progress Notes (Signed)
Exposed Structures: Fat Layer (Subcutaneous N/A N/A Tissue) Exposed: Yes Fascia: No Tendon: No Nina Perez, Nina Perez ([id number]) Muscle: No Joint: No Bone: No Epithelialization: None N/A N/A Debridement: Debridement - Excisional N/A N/A Pre-procedure 11:27 N/A N/A Verification/Time Out Taken: Tissue Debrided: Subcutaneous, Slough N/A N/A Level: Skin/Subcutaneous Tissue N/A N/A Debridement Area (sq cm): 1.53 N/A N/A Instrument: Curette N/A N/A Bleeding: Minimum N/A N/A Hemostasis Achieved: Pressure N/A N/A Debridement Treatment Procedure was tolerated well N/A N/A Response: Post Debridement 1.7x0.9x0.5 N/A N/A Measurements L x W x D (cm) Post Debridement Volume: 0.601 N/A N/A (cm) Procedures Performed: Debridement N/A N/A Treatment Notes Wound #1 (Left, Lateral Foot) Notes santyl, abd, conform Electronic Signature(s) Signed: 03/09/2019 5:54:02 PM By: Linton Ham MD Entered By: Linton Ham on 03/09/2019 13:09:38 Nina Perez ([id number]) -------------------------------------------------------------------------------- Ko [place] Details Patient Name: Nina Perez Date of Service: 03/09/2019 10:45 AM Medical Record Number: [id number] Patient Account Number: [id number] Date of Birth/Sex:  [birth year] (78 y.o. F) Treating RN: Cornell Barman Primary Care Gregorey Nabor: Einar Pheasant Other Clinician: Referring Lajuanna Pompa: Einar Pheasant Treating Mayia Megill/Extender: Tito Dine in Treatment: 1 Active Inactive Abuse / Safety / Falls / Self Care Management Nursing Diagnoses: Impaired physical mobility Potential for falls Goals: Patient will remain injury free related to falls Date Initiated: 03/02/2019 Target Resolution Date: 03/16/2019 Goal Status: Active Interventions: Assess fall risk on admission and as needed Notes: Medication Nursing Diagnoses: Knowledge deficit related to medication safety: actual or potential Goals: Patient/caregiver will demonstrate understanding of all current medications Date Initiated: 03/02/2019 Target Resolution Date: 03/17/2019 Goal Status: Active Interventions: Assess for medication contraindications each visit where new medications are prescribed Notes: Necrotic Tissue Nursing Diagnoses: Impaired tissue integrity related to necrotic/devitalized tissue Goals: Necrotic/devitalized tissue will be minimized in the wound bed Date Initiated: 03/02/2019 Target Resolution Date: 03/17/2019 Goal Status: Active Interventions: Nina Perez, Nina Perez ([id number]) Assess patient pain level pre-, during and post procedure and prior to discharge Treatment Activities: Apply topical anesthetic as ordered : 03/02/2019 Notes: Orientation to the Wound Care Program Nursing Diagnoses: Knowledge deficit related to the wound healing center program Goals: Patient/caregiver will verbalize understanding of the [facility] Date Initiated: 03/02/2019 Target Resolution Date: 03/17/2019 Goal Status: Active Interventions: Provide education on orientation to the wound center Notes: Venous Leg Ulcer Nursing Diagnoses: Actual venous Insuffiency (use after diagnosis is confirmed) Goals: Patient/caregiver will verbalize understanding of disease  process and disease management Date Initiated: 03/02/2019 Target Resolution Date: 03/17/2019 Goal Status: Active Verify adequate tissue perfusion prior to therapeutic compression application Date Initiated: 03/02/2019 Target Resolution Date: 03/17/2019 Goal Status: Active Interventions: Assess peripheral edema status every visit. Provide education on venous insufficiency Notes: Wound/Skin Impairment Nursing Diagnoses: Impaired tissue integrity Goals: Ulcer/skin breakdown will have a volume reduction of 30% by week 4 Date Initiated: 03/02/2019 Target Resolution Date: 04/02/2019 Goal Status: Active Interventions: Assess ulceration(s) every visit Nina Perez, Nina Perez ([id number]) Treatment Activities: Topical wound management initiated : 03/02/2019 Notes: Electronic Signature(s) Signed: 03/10/2019 10:04:30 AM By: Gretta Cool, BSN, RN, CWS, Kim RN, BSN Entered By: Gretta Cool, BSN, RN, CWS, Kim on 03/09/2019 11:27:03 Nina Perez ([id number]) -------------------------------------------------------------------------------- Pain Assessment Details Patient Name: Nina Perez Date of Service: 03/09/2019 10:45 AM Medical Record Number: [id number] Patient Account Number: [id number] Date of Birth/Sex: December 28, 1940 (78 y.o. F) Treating RN: Harold Barban Primary Care Nawaal Alling: Einar Pheasant Other Clinician: Referring Hansini Clodfelter: Einar Pheasant Treating Kemya Shed/Extender: Tito Dine in Treatment: 1 Active Problems Location of Pain Severity and Description of Pain Patient  Exposed Structures: Fat Layer (Subcutaneous N/A N/A Tissue) Exposed: Yes Fascia: No Tendon: No Nina Perez, Nina Perez ([id number]) Muscle: No Joint: No Bone: No Epithelialization: None N/A N/A Debridement: Debridement - Excisional N/A N/A Pre-procedure 11:27 N/A N/A Verification/Time Out Taken: Tissue Debrided: Subcutaneous, Slough N/A N/A Level: Skin/Subcutaneous Tissue N/A N/A Debridement Area (sq cm): 1.53 N/A N/A Instrument: Curette N/A N/A Bleeding: Minimum N/A N/A Hemostasis Achieved: Pressure N/A N/A Debridement Treatment Procedure was tolerated well N/A N/A Response: Post Debridement 1.7x0.9x0.5 N/A N/A Measurements L x W x D (cm) Post Debridement Volume: 0.601 N/A N/A (cm) Procedures Performed: Debridement N/A N/A Treatment Notes Wound #1 (Left, Lateral Foot) Notes santyl, abd, conform Electronic Signature(s) Signed: 03/09/2019 5:54:02 PM By: Linton Ham MD Entered By: Linton Ham on 03/09/2019 13:09:38 Nina Perez ([id number]) -------------------------------------------------------------------------------- Ko Olina Details Patient Name: Nina Perez Date of Service: 03/09/2019 10:45 AM Medical Record Number: [id number] Patient Account Number: [id number] Date of Birth/Sex:  [birth year] (78 y.o. F) Treating RN: Cornell Barman Primary Care Gregorey Nabor: Einar Pheasant Other Clinician: Referring Lajuanna Pompa: Einar Pheasant Treating Mayia Megill/Extender: Tito Dine in Treatment: 1 Active Inactive Abuse / Safety / Falls / Self Care Management Nursing Diagnoses: Impaired physical mobility Potential for falls Goals: Patient will remain injury free related to falls Date Initiated: 03/02/2019 Target Resolution Date: 03/16/2019 Goal Status: Active Interventions: Assess fall risk on admission and as needed Notes: Medication Nursing Diagnoses: Knowledge deficit related to medication safety: actual or potential Goals: Patient/caregiver will demonstrate understanding of all current medications Date Initiated: 03/02/2019 Target Resolution Date: 03/17/2019 Goal Status: Active Interventions: Assess for medication contraindications each visit where new medications are prescribed Notes: Necrotic Tissue Nursing Diagnoses: Impaired tissue integrity related to necrotic/devitalized tissue Goals: Necrotic/devitalized tissue will be minimized in the wound bed Date Initiated: 03/02/2019 Target Resolution Date: 03/17/2019 Goal Status: Active Interventions: Nina Perez, Nina Perez ([id number]) Assess patient pain level pre-, during and post procedure and prior to discharge Treatment Activities: Apply topical anesthetic as ordered : 03/02/2019 Notes: Orientation to the Wound Care Program Nursing Diagnoses: Knowledge deficit related to the wound healing center program Goals: Patient/caregiver will verbalize understanding of the [facility] Date Initiated: 03/02/2019 Target Resolution Date: 03/17/2019 Goal Status: Active Interventions: Provide education on orientation to the wound center Notes: Venous Leg Ulcer Nursing Diagnoses: Actual venous Insuffiency (use after diagnosis is confirmed) Goals: Patient/caregiver will verbalize understanding of disease  process and disease management Date Initiated: 03/02/2019 Target Resolution Date: 03/17/2019 Goal Status: Active Verify adequate tissue perfusion prior to therapeutic compression application Date Initiated: 03/02/2019 Target Resolution Date: 03/17/2019 Goal Status: Active Interventions: Assess peripheral edema status every visit. Provide education on venous insufficiency Notes: Wound/Skin Impairment Nursing Diagnoses: Impaired tissue integrity Goals: Ulcer/skin breakdown will have a volume reduction of 30% by week 4 Date Initiated: 03/02/2019 Target Resolution Date: 04/02/2019 Goal Status: Active Interventions: Assess ulceration(s) every visit Nina Perez, Nina Perez ([id number]) Treatment Activities: Topical wound management initiated : 03/02/2019 Notes: Electronic Signature(s) Signed: 03/10/2019 10:04:30 AM By: Gretta Cool, BSN, RN, CWS, Kim RN, BSN Entered By: Gretta Cool, BSN, RN, CWS, Kim on 03/09/2019 11:27:03 Nina Perez ([id number]) -------------------------------------------------------------------------------- Pain Assessment Details Patient Name: Nina Perez Date of Service: 03/09/2019 10:45 AM Medical Record Number: [id number] Patient Account Number: [id number] Date of Birth/Sex: December 28, 1940 (78 y.o. F) Treating RN: Harold Barban Primary Care Nawaal Alling: Einar Pheasant Other Clinician: Referring Hansini Clodfelter: Einar Pheasant Treating Kemya Shed/Extender: Tito Dine in Treatment: 1 Active Problems Location of Pain Severity and Description of Pain Patient  Nina Perez, Nina Perez ([id number]) Visit Report for 03/09/2019 Arrival Information Details Patient Name: Nina Perez, Nina Perez Date of Service: 03/09/2019 10:45 AM Medical Record Number: [id number] Patient Account Number: [id number] Date of Birth/Sex: [birth year] (78 y.o. F) Treating RN: Harold Barban Primary Care Marlayna Bannister: Einar Pheasant Other Clinician: Referring Jayzen Paver: Einar Pheasant Treating Brooklinn Longbottom/Extender: Tito Dine in Treatment: 1 Visit Information History Since Last Visit Added or deleted any medications: No Patient Arrived: Wheel Chair Any new allergies or adverse reactions: No Arrival Time: 10:45 Had a fall or experienced change in No Accompanied By: husband activities of daily living that may affect Transfer Assistance: EasyPivot Patient risk of falls: Lift Signs or symptoms of abuse/neglect since last visito No Patient Identification Verified: Yes Hospitalized since last visit: No Secondary Verification Process Yes Has Dressing in Place as Prescribed: Yes Completed: Pain Present Now: Yes Electronic Signature(s) Signed: 03/09/2019 4:41:01 PM By: Harold Barban Entered By: Harold Barban on 03/09/2019 10:47:37 Nina Perez ([id number]) -------------------------------------------------------------------------------- Encounter Discharge Information Details Patient Name: Nina Perez Date of Service: 03/09/2019 10:45 AM Medical Record Number: [id number] Patient Account Number: [id number] Date of Birth/Sex: 1940-06-04 (78 y.o. F) Treating RN: Cornell Barman Primary Care Victoriah Wilds: Einar Pheasant Other Clinician: Referring Leta Bucklin: Einar Pheasant Treating Coletta Lockner/Extender: Tito Dine in Treatment: 1 Encounter Discharge Information Items Post Procedure Vitals Discharge Condition: Stable Temperature (F): 98.5 Ambulatory Status: Ambulatory Pulse (bpm): 67 Discharge Destination: Home Respiratory Rate (breaths/min): 18 Transportation: Private  Auto Blood Pressure (mmHg): 148/85 Accompanied By: husband Schedule Follow-up Appointment: Yes Clinical Summary of Care: Electronic Signature(s) Signed: 03/10/2019 10:04:30 AM By: Gretta Cool, BSN, RN, CWS, Kim RN, BSN Entered By: Gretta Cool, BSN, RN, CWS, Kim on 03/09/2019 11:31:22 Nina Perez ([id number]) -------------------------------------------------------------------------------- Lower Extremity Assessment Details Patient Name: Nina Perez Date of Service: 03/09/2019 10:45 AM Medical Record Number: [id number] Patient Account Number: [id number] Date of Birth/Sex: 04/13/1941 (78 y.o. F) Treating RN: Harold Barban Primary Care Jahne Krukowski: Einar Pheasant Other Clinician: Referring Freddie Nghiem: Einar Pheasant Treating Jordell Outten/Extender: Ricard Dillon Weeks in Treatment: 1 Vascular Assessment Pulses: Dorsalis Pedis Palpable: [Left:Yes] Posterior Tibial Palpable: [Left:Yes] Electronic Signature(s) Signed: 03/09/2019 4:41:01 PM By: Harold Barban Entered By: Harold Barban on 03/09/2019 10:52:09 Nina Perez ([id number]) -------------------------------------------------------------------------------- Multi Wound Chart Details Patient Name: Nina Perez Date of Service: 03/09/2019 10:45 AM Medical Record Number: [id number] Patient Account Number: [id number] Date of Birth/Sex: 1940-06-29 (78 y.o. F) Treating RN: Cornell Barman Primary Care Mccayla Shimada: Einar Pheasant Other Clinician: Referring Alzina Golda: Einar Pheasant Treating Mavryk Pino/Extender: Tito Dine in Treatment: 1 Vital Signs Height(in): 66 Pulse(bpm): 25 Weight(lbs): 230 Blood Pressure(mmHg): 148/85 Body Mass Index(BMI): 37 Temperature(F): 98.5 Respiratory Rate 18 (breaths/min): Photos: [N/A:N/A] Wound Location: Left Foot - Lateral N/A N/A Wounding Event: Gradually Appeared N/A N/A Primary Etiology: Diabetic Wound/Ulcer of the N/A N/A Lower Extremity Comorbid History: Hypertension, Type II N/A  N/A Diabetes, Scleroderma, Gout, Rheumatoid Arthritis Date Acquired: 01/17/2019 N/A N/A Weeks of Treatment: 1 N/A N/A Wound Status: Open N/A N/A Measurements L x W x D 1.7x0.9x0.4 N/A N/A (cm) Area (cm) : 1.202 N/A N/A Volume (cm) : 0.481 N/A N/A % Reduction in Area: -21.40% N/A N/A % Reduction in Volume: -142.90% N/A N/A Classification: Grade 1 N/A N/A Exudate Amount: Medium N/A N/A Exudate Type: Serosanguineous N/A N/A Exudate Color: red, brown N/A N/A Wound Margin: Flat and Intact N/A N/A Granulation Amount: Small (1-33%) N/A N/A Granulation Quality: Red N/A N/A Necrotic Amount: Large (67-100%) N/A N/A Necrotic Tissue: Eschar, Adherent Slough N/A N/A

## 2019-03-10 NOTE — Progress Notes (Signed)
AIMME, STARZYNSKI ([id number]) Visit Report for 03/09/2019 Debridement Details Patient Name: Nina Perez, Nina Perez Date of Service: 03/09/2019 10:45 AM Medical Record Number: [id number] Patient Account Number: [id number] Date of Birth/Sex: 1940-07-30 (78 y.o. F) Treating RN: Cornell Barman Primary Care Provider: Einar Pheasant Other Clinician: Referring Provider: Einar Pheasant Treating Provider/Extender: Tito Dine in Treatment: 1 Debridement Performed for Wound #1 Left,Lateral Foot Assessment: Performed By: Physician Ricard Dillon, MD Debridement Type: Debridement Severity of Tissue Pre Fat layer exposed Debridement: Level of Consciousness (Pre- Awake and Alert procedure): Pre-procedure Verification/Time Yes - 11:27 Out Taken: Total Area Debrided (L x W): 1.7 (cm) x 0.9 (cm) = 1.53 (cm) Tissue and other material Viable, Non-Viable, Slough, Subcutaneous, Slough debrided: Level: Skin/Subcutaneous Tissue Debridement Description: Excisional Instrument: Curette Bleeding: Minimum Hemostasis Achieved: Pressure End Time: 11:30 Response to Treatment: Procedure was tolerated well Level of Consciousness Awake and Alert (Post-procedure): Post Debridement Measurements of Total Wound Length: (cm) 1.7 Width: (cm) 0.9 Depth: (cm) 0.5 Volume: (cm) 0.601 Character of Wound/Ulcer Post Debridement: Stable Severity of Tissue Post Debridement: Fat layer exposed Post Procedure Diagnosis Same as Pre-procedure Electronic Signature(s) Signed: 03/09/2019 5:54:02 PM By: Linton Ham MD Signed: 03/10/2019 10:04:30 AM By: Gretta Cool, BSN, RN, CWS, Kim RN, BSN Entered By: Linton Ham on 03/09/2019 13:10:09 Nina Perez ([id number]) -------------------------------------------------------------------------------- HPI Details Patient Name: Nina Perez Date of Service: 03/09/2019 10:45 AM Medical Record Number: [id number] Patient Account Number: [id number] Date of Birth/Sex:  [birth year] (78 y.o. F) Treating RN: Cornell Barman Primary Care Provider: Einar Pheasant Other Clinician: Referring Provider: Einar Pheasant Treating Provider/Extender: Tito Dine in Treatment: 1 History of Present Illness HPI Description: ADMISSION 03/02/2019 Mrs. Clopton is a 78 year old woman with a complicated past medical history who presents for review of wound on the left lateral foot for the last 6 weeks. She states she simply looked down and saw a black spot on the left lateral foot and thought this was dirt went to wipe it off and there was an open wound. This is not gotten any better and over the timeframe. She has been using topical triple antibiotic ointment. She has a history of chronic venous insufficiency and has had wounds on her lower legs related to this but never on her feet. She is known to Dr. Aleda Grana at [facility] in [place] and she went to see him about this. He did not order any specific tests and said that he was not exactly sure what it because this per the patient's description. She was put in an [employer] but it looked as though this was stopped on 10/8. She had a week's worth of doxycycline as well. Patient states the area is very painful rating it at a 10 out of 10. She does not really describe claudication element. 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 X-ray we ordered last week was negative for any bony abnormality The patient had venous studies on 06/16/2018 in the [facility] system. There was no thrombosis and they did not comment on reflux. She has had previous vein surgery ABIs in this clinic were 0.96 on the right and 0.88 on the left 10/28; the patient arrives with a small punched out area on the left lateral foot. This appears somewhat better than  last week but still with a nonviable surface. I did not feel when  I admitted her last week that she had significant arterial disease. She definitely has chronic venous insufficiency and multiple other rheumatologic issues including rheumatoid arthritis, scleroderma crest syndrome but the exact etiology of this wound is not really clear in its location. We used [employer]) Signed: 03/09/2019 5:54:02 PM By: Linton Ham MD Entered By: Linton Ham on 03/09/2019 13:39:06 Nina Perez ([id number]) -------------------------------------------------------------------------------- Physical Exam Details Patient Name: Nina Perez Date of Service: 03/09/2019 10:45 AM Medical Record Number: [id number] Patient Account Number: [id number] Date of Birth/Sex: 1941-02-10 (78 y.o. F) Treating RN: Cornell Barman Primary Care Provider: Einar Pheasant Other Clinician: Referring Provider: Einar Pheasant Treating Provider/Extender: Tito Dine in Treatment: 1 Constitutional Patient is hypertensive.. Pulse regular and within target range for patient.Marland Kitchen Respirations regular, non-labored and within target range.. Temperature is normal and within the target range for the patient.Marland Kitchen appears in no distress. Cardiovascular Pedal pulses palpable. Notes Wound exam; the area in question is on the left lateral foot. Still a necrotic surface. Using a #3 curette I did is much as I could to get to clean healthy tissue. We are able to get to a better surface this week although the patient finds this still uncomfortable. I see no convincing evidence of infection there is no palpable bone Electronic Signature(s) Signed: 03/09/2019 5:54:02 PM By: Linton Ham MD Entered By: Linton Ham on 03/09/2019 13:14:00 Nina Perez ([id number]) -------------------------------------------------------------------------------- Physician Orders Details Patient Name: Nina Perez Date of  Service: 03/09/2019 10:45 AM Medical Record Number: [id number] Patient Account Number: [id number] Date of Birth/Sex: [birth year] (78 y.o. F) Treating RN: Cornell Barman Primary Care Provider: Einar Pheasant Other Clinician: Referring Provider: Einar Pheasant Treating Provider/Extender: Tito Dine in Treatment: 1 Verbal / Phone Orders: No Diagnosis Coding Wound Cleansing Wound #1 Left,Lateral Foot o Clean wound with Normal Saline. o May Shower, gently pat wound dry prior to applying new dressing. Anesthetic (add to Medication List) Wound #1 Left,Lateral Foot o Topical Lidocaine 4% cream applied to wound bed prior to debridement (In Clinic Only). Primary Wound Dressing Wound #1 Left,Lateral Foot o Santyl Ointment Secondary Dressing Wound #1 Left,Lateral Foot o ABD and Kerlix/Conform Dressing Change Frequency Wound #1 Left,Lateral Foot o Change dressing every day. Follow-up Appointments Wound #1 Left,Lateral Foot o Return Appointment in 1 week. Edema Control Wound #1 Left,Lateral Foot o Patient to wear own compression stockings o Elevate legs to the level of the heart and pump ankles as often as possible Off-Loading Wound #1 Left,Lateral Foot o Other: - keep pressure off of wound area Additional Orders / Instructions Wound #1 Left,Lateral Foot o Activity as tolerated Medications-please add to medication list. Wound #1 Left,Lateral Foot Nina Perez, Nina Perez ([id number]) o Santyl Enzymatic Ointment Electronic Signature(s) Signed: 03/09/2019 5:54:02 PM By: Linton Ham MD Signed: 03/10/2019 10:04:30 AM By: Gretta Cool, BSN, RN, CWS, Kim RN, BSN Entered By: Gretta Cool, BSN, RN, CWS, Kim on 03/09/2019 11:29:10 Nina Perez ([id number]) -------------------------------------------------------------------------------- Problem List Details Patient Name: Nina Perez Date of Service: 03/09/2019 10:45 AM Medical Record Number: [id number] Patient Account  Number: [id number] Date of Birth/Sex: [birth year] (78 y.o. F) Treating RN: Cornell Barman Primary Care Provider: Einar Pheasant Other Clinician: Referring Provider: Einar Pheasant Treating Provider/Extender: Tito Dine in Treatment: 1 Active Problems ICD-10 Evaluated Encounter Code Description Active Date Today Diagnosis E11.621 Type 2 diabetes mellitus with foot ulcer 03/02/2019 No Yes I87.323 Chronic venous hypertension (idiopathic) with inflammation of 03/02/2019 No Yes bilateral lower extremity L97.528 Non-pressure chronic ulcer  of other part of left foot with other 03/02/2019 No Yes specified severity M05.7A Rheumatoid arthritis with rheumatoid factor of other specified 03/02/2019 No Yes site without organ or systems involvement Inactive Problems Resolved Problems Electronic Signature(s) Signed: 03/09/2019 5:54:02 PM By: Linton Ham MD Entered By: Linton Ham on 03/09/2019 13:09:26 Nina Perez ([id number]) -------------------------------------------------------------------------------- Progress Note Details Patient Name: Nina Perez Date of Service: 03/09/2019 10:45 AM Medical Record Number: [id number] Patient Account Number: [id number] Date of Birth/Sex: 01-30-1941 (78 y.o. F) Treating RN: Cornell Barman Primary Care Provider: Einar Pheasant Other Clinician: Referring Provider: Einar Pheasant Treating Provider/Extender: Tito Dine in Treatment: 1 Subjective History of Present Illness (HPI) ADMISSION 03/02/2019 Mrs. Motola is a 78 year old woman with a complicated past medical history who presents for review of wound on the left lateral foot for the last 6 weeks. She states she simply looked down and saw a black spot on the left lateral foot and thought this was dirt went to wipe it off and there was an open wound. This is not gotten any better and over the timeframe. She has been using topical triple antibiotic ointment. She has a history  of chronic venous insufficiency and has had wounds on her lower legs related to this but never on her feet. She is known to Dr. Aleda Grana at [facility] in [place] and she went to see him about this. He did not order any specific tests and said that he was not exactly sure what it because this per the patient's description. She was put in an [employer] but it looked as though this was stopped on 10/8. She had a week's worth of doxycycline as well. Patient states the area is very painful rating it at a 10 out of 10. She does not really describe claudication element. 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 X-ray we ordered last week was negative for any bony abnormality The patient had venous studies on 06/16/2018 in the [facility] system. There was no thrombosis and they did not comment on reflux. She has had previous vein surgery ABIs in this clinic were 0.96 on the right and 0.88 on the left 10/28; the patient arrives with a small punched out area on the left lateral foot. This appears somewhat better than last week but still with a nonviable surface. I did not feel when I admitted her last week that she had significant arterial disease. She definitely has chronic venous insufficiency and multiple other rheumatologic issues including rheumatoid arthritis, scleroderma crest syndrome but the exact etiology of this wound is not really clear in its location. We used Santyl Objective Constitutional Patient is hypertensive.. Pulse regular and within target range for patient.Marland Kitchen Respirations regular, non-labored and within target range.. Temperature is normal and within the target range for the patient.Marland Kitchen appears in no distress. Vitals Time Taken: 10:48 AM, Height: 66 in, Weight: 230 lbs, BMI: 37.1, Temperature:  98.5 F, Pulse: 67 bpm, Respiratory Rate: 18 breaths/min, Blood Pressure: 148/85 mmHg. Cardiovascular Nina Perez, Nina Perez. ([id number]) Pedal pulses palpable. General Notes: Wound exam; the area in question is on the left lateral foot. Still a necrotic surface. Using a #3 curette I did is much as I could to get to clean healthy tissue. We are able to get to a better surface this week although the patient finds this still uncomfortable. I  see no convincing evidence of infection there is no palpable bone Integumentary (Hair, Skin) Wound #1 status is Open. Original cause of wound was Gradually Appeared. The wound is located on the Left,Lateral Foot. The wound measures 1.7cm length x 0.9cm width x 0.4cm depth; 1.202cm^2 area and 0.481cm^3 volume. There is Fat Layer (Subcutaneous Tissue) Exposed exposed. There is no tunneling or undermining noted. There is a medium amount of serosanguineous drainage noted. The wound margin is flat and intact. There is small (1-33%) red granulation within the wound bed. There is a large (67-100%) amount of necrotic tissue within the wound bed including Eschar and Adherent Slough. Assessment Active Problems ICD-10 Type 2 diabetes mellitus with foot ulcer Chronic venous hypertension (idiopathic) with inflammation of bilateral lower extremity Non-pressure chronic ulcer of other part of left foot with other specified severity Rheumatoid arthritis with rheumatoid factor of other specified site without organ or systems involvement Procedures Wound #1 Pre-procedure diagnosis of Wound #1 is a Diabetic Wound/Ulcer of the Lower Extremity located on the Left,Lateral Foot .Severity of Tissue Pre Debridement is: Fat layer exposed. There was a Excisional Skin/Subcutaneous Tissue Debridement with a total area of 1.53 sq cm performed by Ricard Dillon, MD. With the following instrument(s): Curette to remove Viable and Non-Viable tissue/material. Material removed includes  Subcutaneous Tissue and Slough and. No specimens were taken. A time out was conducted at 11:27, prior to the start of the procedure. A Minimum amount of bleeding was controlled with Pressure. The procedure was tolerated well. Post Debridement Measurements: 1.7cm length x 0.9cm width x 0.5cm depth; 0.601cm^3 volume. Character of Wound/Ulcer Post Debridement is stable. Severity of Tissue Post Debridement is: Fat layer exposed. Post procedure Diagnosis Wound #1: Same as Pre-Procedure Plan Wound Cleansing: Wound #1 Left,Lateral Foot: Clean wound with Normal Saline. May Shower, gently pat wound dry prior to applying new dressing. Anesthetic (add to Medication List): Nina Perez, Nina Perez ([id number]) Wound #1 Left,Lateral Foot: Topical Lidocaine 4% cream applied to wound bed prior to debridement (In Clinic Only). Primary Wound Dressing: Wound #1 Left,Lateral Foot: Santyl Ointment Secondary Dressing: Wound #1 Left,Lateral Foot: ABD and Kerlix/Conform Dressing Change Frequency: Wound #1 Left,Lateral Foot: Change dressing every day. Follow-up Appointments: Wound #1 Left,Lateral Foot: Return Appointment in 1 week. Edema Control: Wound #1 Left,Lateral Foot: Patient to wear own compression stockings Elevate legs to the level of the heart and pump ankles as often as possible Off-Loading: Wound #1 Left,Lateral Foot: Other: - keep pressure off of wound area Additional Orders / Instructions: Wound #1 Left,Lateral Foot: Activity as tolerated Medications-please add to medication list.: Wound #1 Left,Lateral Foot: Santyl Enzymatic Ointment 1. I am continue with Santyl change daily 2. Once we get to a healthier looking surface likely endoform. Perhaps an advanced treatment option. This has depth 3. The exact etiology of this is not clear however I have not felt the need to biopsy at this point Electronic Signature(s) Signed: 03/09/2019 1:39:45 PM By: Linton Ham MD Entered By: Linton Ham  on 03/09/2019 13:39:45 Nina Perez ([id number]) -------------------------------------------------------------------------------- [place] Details Patient Name: Nina Perez Date of Service: 03/09/2019 Medical Record Number: [id number] Patient Account Number: [id number] Date of Birth/Sex: 10/06/1940 (78 y.o. F) Treating RN: Cornell Barman Primary Care Provider: Einar Pheasant Other Clinician: Referring Provider: Einar Pheasant Treating Provider/Extender: Tito Dine in Treatment: 1 Diagnosis Coding ICD-10 Codes Code Description E11.621 Type 2 diabetes mellitus with foot ulcer I87.323 Chronic venous hypertension (idiopathic) with inflammation of bilateral lower extremity L97.528 Non-pressure chronic ulcer  of other part of left foot with other specified severity M05.7A Rheumatoid arthritis with rheumatoid factor of other specified site without organ or systems involvement Facility Procedures CPT4 Code Description: [id number] - DEB SUBQ TISSUE 20 SQ CM/< ICD-10 Diagnosis Description L97.528 Non-pressure chronic ulcer of other part of left foot with other Modifier: specified sever Quantity: 1 ity Physician Procedures CPT4 Code Description: [id number] - WC PHYS SUBQ TISS 20 SQ CM ICD-10 Diagnosis Description L97.528 Non-pressure chronic ulcer of other part of left foot with other Modifier: specified severi Quantity: 1 ty Electronic Signature(s) Signed: 03/09/2019 5:54:02 PM By: Linton Ham MD Entered By: Linton Ham on 03/09/2019 13:15:12

## 2019-03-16 ENCOUNTER — Other Ambulatory Visit: Payer: Self-pay

## 2019-03-16 ENCOUNTER — Encounter: Payer: Medicare Other | Attending: Internal Medicine | Admitting: Internal Medicine

## 2019-03-16 DIAGNOSIS — Z7984 Long term (current) use of oral hypoglycemic drugs: Secondary | ICD-10-CM | POA: Diagnosis not present

## 2019-03-16 DIAGNOSIS — E11621 Type 2 diabetes mellitus with foot ulcer: Secondary | ICD-10-CM | POA: Insufficient documentation

## 2019-03-16 DIAGNOSIS — I87323 Chronic venous hypertension (idiopathic) with inflammation of bilateral lower extremity: Secondary | ICD-10-CM | POA: Diagnosis not present

## 2019-03-16 DIAGNOSIS — Z87891 Personal history of nicotine dependence: Secondary | ICD-10-CM | POA: Insufficient documentation

## 2019-03-16 DIAGNOSIS — M341 CR(E)ST syndrome: Secondary | ICD-10-CM | POA: Diagnosis not present

## 2019-03-16 DIAGNOSIS — M057A Rheumatoid arthritis with rheumatoid factor of other specified site without organ or systems involvement: Secondary | ICD-10-CM | POA: Diagnosis not present

## 2019-03-16 DIAGNOSIS — I10 Essential (primary) hypertension: Secondary | ICD-10-CM | POA: Insufficient documentation

## 2019-03-16 DIAGNOSIS — L97528 Non-pressure chronic ulcer of other part of left foot with other specified severity: Secondary | ICD-10-CM | POA: Insufficient documentation

## 2019-03-18 NOTE — Progress Notes (Signed)
LAINY, STAUS ([id number]) Visit Report for 03/16/2019 Debridement Details Patient Name: Nina Perez, Nina Perez Date of Service: 03/16/2019 10:45 AM Medical Record Number: [id number] Patient Account Number: [id number] Date of Birth/Sex: [birth year] (78 y.o. F) Treating RN: Cornell Barman Primary Care Provider: Einar Pheasant Other Clinician: Referring Provider: Einar Pheasant Treating Provider/Extender: Tito Dine in Treatment: 2 Debridement Performed for Wound #1 Left,Lateral Foot Assessment: Performed By: Physician Ricard Dillon, MD Debridement Type: Debridement Severity of Tissue Pre Necrosis of muscle Debridement: Level of Consciousness (Pre- Awake and Alert procedure): Pre-procedure Verification/Time Yes - 10:51 Out Taken: Start Time: 10:51 Pain Control: Lidocaine Total Area Debrided (L x W): 1.3 (cm) x 0.8 (cm) = 1.04 (cm) Tissue and other material Viable, Non-Viable, Muscle, Slough, Subcutaneous, Slough debrided: Level: Skin/Subcutaneous Tissue/Muscle Debridement Description: Excisional Instrument: Curette Bleeding: Minimum Hemostasis Achieved: Pressure End Time: 10:54 Response to Treatment: Procedure was tolerated well Level of Consciousness Awake and Alert (Post-procedure): Post Debridement Measurements of Total Wound Length: (cm) 1.3 Width: (cm) 0.8 Depth: (cm) 0.7 Volume: (cm) 0.572 Character of Wound/Ulcer Post Debridement: Stable Severity of Tissue Post Debridement: Muscle involvement without necrosis Post Procedure Diagnosis Same as Pre-procedure Electronic Signature(s) Signed: 03/17/2019 5:19:38 PM By: Gretta Cool, BSN, RN, CWS, Kim RN, BSN Signed: 03/18/2019 7:47:21 AM By: Linton Ham MD Entered By: Linton Ham on 03/16/2019 11:09:16 Nina Perez ([id number]) -------------------------------------------------------------------------------- HPI Details Patient Name: Nina Perez Date of Service: 03/16/2019 10:45 AM Medical Record Number:  [id number] Patient Account Number: [id number] Date of Birth/Sex: [birth year] (78 y.o. F) Treating RN: Cornell Barman Primary Care Provider: Einar Pheasant Other Clinician: Referring Provider: Einar Pheasant Treating Provider/Extender: Tito Dine in Treatment: 2 History of Present Illness HPI Description: ADMISSION 03/02/2019 Mrs. Andrews is a 78 year old woman with a complicated past medical history who presents for review of wound on the left lateral foot for the last 6 weeks. She states she simply looked down and saw a black spot on the left lateral foot and thought this was dirt went to wipe it off and there was an open wound. This is not gotten any better and over the timeframe. She has been using topical triple antibiotic ointment. She has a history of chronic venous insufficiency and has had wounds on her lower legs related to this but never on her feet. She is known to Dr. Aleda Grana at [facility] in [place] and she went to see him about this. He did not order any specific tests and said that he was not exactly sure what it because this per the patient's description. She was put in an [employer] but it looked as though this was stopped on 10/8. She had a week's worth of doxycycline as well. Patient states the area is very painful rating it at a 10 out of 10. She does not really describe claudication element. 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 X-ray we ordered last week was negative for any bony abnormality The patient had venous studies on 06/16/2018 in the [facility] system. There was no thrombosis and they did not comment on reflux. She has had previous vein surgery ABIs in this clinic were 0.96 on the right and 0.88 on the left 10/28; the patient arrives with a small punched out  area on the left lateral foot. This appears somewhat better than last week but still with  a nonviable surface. I did not feel when I admitted her last week that she had significant arterial disease. She definitely has chronic venous insufficiency and multiple other rheumatologic issues including rheumatoid arthritis, scleroderma crest syndrome but the exact etiology of this wound is not really clear in its location. We used Santyl 11/4; small punched out area on the left anterior lateral foot. The exact reason for this wound is not really clear. She has venous disease however I am not certain that this explains the presentation like this. She saw Dr. Aleda Grana of [facility] before she came into this clinic. She does have other possible alternatives for this wound. I do not believe she has an arterial issue however she does have rheumatoid arthritis, crest syndrome etc. I have been concerned to actually biopsy this however unless of course it worsens Electronic Signature(s) Signed: 03/18/2019 7:47:21 AM By: Linton Ham MD Entered By: Linton Ham on 03/16/2019 11:10:45 Nina Perez ([id number]) -------------------------------------------------------------------------------- Physical Exam Details Patient Name: Nina Perez Date of Service: 03/16/2019 10:45 AM Medical Record Number: [id number] Patient Account Number: [id number] Date of Birth/Sex: 1940/09/17 (78 y.o. F) Treating RN: Cornell Barman Primary Care Provider: Einar Pheasant Other Clinician: Referring Provider: Einar Pheasant Treating Provider/Extender: Tito Dine in Treatment: 2 Constitutional Patient is hypertensive.. Pulse regular and within target range for patient.Marland Kitchen Respirations regular, non-labored and within target range.. Temperature is normal and within the target range for the patient.Marland Kitchen appears in no distress. Cardiovascular Needle pulses are palpable. Notes Wound exam; the area in question is on  the left lateral foot. Small punched out wound with a completely nonviable surface. This is very painful although not overtly infected. Has tightly adherent necrotic debris probably down into the muscle layer of the foot. But I cannot see any viable tissue. Electronic Signature(s) Signed: 03/18/2019 7:47:21 AM By: Linton Ham MD Entered By: Linton Ham on 03/16/2019 11:14:31 Nina Perez ([id number]) -------------------------------------------------------------------------------- Physician Orders Details Patient Name: Nina Perez Date of Service: 03/16/2019 10:45 AM Medical Record Number: [id number] Patient Account Number: [id number] Date of Birth/Sex: 03/04/1941 (78 y.o. F) Treating RN: Cornell Barman Primary Care Provider: Einar Pheasant Other Clinician: Referring Provider: Einar Pheasant Treating Provider/Extender: Tito Dine in Treatment: 2 Verbal / Phone Orders: No Diagnosis Coding Wound Cleansing Wound #1 Left,Lateral Foot o Clean wound with Normal Saline. o May Shower, gently pat wound dry prior to applying new dressing. Anesthetic (add to Medication List) Wound #1 Left,Lateral Foot o Topical Lidocaine 4% cream applied to wound bed prior to debridement (In Clinic Only). Primary Wound Dressing Wound #1 Left,Lateral Foot o Santyl Ointment Secondary Dressing Wound #1 Left,Lateral Foot o ABD and Kerlix/Conform Dressing Change Frequency Wound #1 Left,Lateral Foot o Change dressing every day. Follow-up Appointments Wound #1 Left,Lateral Foot o Return Appointment in 1 week. Edema Control Wound #1 Left,Lateral Foot o Patient to wear own compression stockings o Elevate legs to the level of the heart and pump ankles as often as possible Off-Loading Wound #1 Left,Lateral Foot o Other: - keep pressure off of wound area Additional Orders / Instructions Wound #1 Left,Lateral Foot o Activity as tolerated Medications-please add to  medication list. Wound #1 Left,Lateral Foot DEJON, KASSEBAUM ([id number]) o Santyl Enzymatic Ointment Electronic Signature(s) Signed: 03/17/2019 5:19:38 PM By: Gretta Cool, BSN, RN, CWS, Kim RN, BSN Signed: 03/18/2019 7:47:21 AM By: Linton Ham MD Entered By: Gretta Cool, BSN, RN, CWS, Kim on 03/16/2019 10:55:20 Nina Perez ([id number]) -------------------------------------------------------------------------------- Problem List Details Patient Name: PFANNENSTIEL,  Nadara Mode Date of Service: 03/16/2019 10:45 AM Medical Record Number: [id number] Patient Account Number: [id number] Date of Birth/Sex: [birth year] (78 y.o. F) Treating RN: Cornell Barman Primary Care Provider: Einar Pheasant Other Clinician: Referring Provider: Einar Pheasant Treating Provider/Extender: Tito Dine in Treatment: 2 Active Problems ICD-10 Evaluated Encounter Code Description Active Date Today Diagnosis E11.621 Type 2 diabetes mellitus with foot ulcer 03/02/2019 No Yes I87.323 Chronic venous hypertension (idiopathic) with inflammation of 03/02/2019 No Yes bilateral lower extremity L97.528 Non-pressure chronic ulcer of other part of left foot with other 03/02/2019 No Yes specified severity M05.7A Rheumatoid arthritis with rheumatoid factor of other specified 03/02/2019 No Yes site without organ or systems involvement Inactive Problems Resolved Problems Electronic Signature(s) Signed: 03/18/2019 7:47:21 AM By: Linton Ham MD Entered By: Linton Ham on 03/16/2019 11:07:50 Nina Perez ([id number]) -------------------------------------------------------------------------------- Progress Note Details Patient Name: Nina Perez Date of Service: 03/16/2019 10:45 AM Medical Record Number: [id number] Patient Account Number: [id number] Date of Birth/Sex: 01/10/1941 (78 y.o. F) Treating RN: Cornell Barman Primary Care Provider: Einar Pheasant Other Clinician: Referring Provider: Einar Pheasant Treating  Provider/Extender: Tito Dine in Treatment: 2 Subjective History of Present Illness (HPI) ADMISSION 03/02/2019 Mrs. Sesto is a 78 year old woman with a complicated past medical history who presents for review of wound on the left lateral foot for the last 6 weeks. She states she simply looked down and saw a black spot on the left lateral foot and thought this was dirt went to wipe it off and there was an open wound. This is not gotten any better and over the timeframe. She has been using topical triple antibiotic ointment. She has a history of chronic venous insufficiency and has had wounds on her lower legs related to this but never on her feet. She is known to Dr. Aleda Grana at [facility] in [place] and she went to see him about this. He did not order any specific tests and said that he was not exactly sure what it because this per the patient's description. She was put in an [employer] but it looked as though this was stopped on 10/8. She had a week's worth of doxycycline as well. Patient states the area is very painful rating it at a 10 out of 10. She does not really describe claudication element. 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 X-ray we ordered last week was negative for any bony abnormality The patient had venous studies on 06/16/2018 in the [facility] system. There was no thrombosis and they did not comment on reflux. She has had previous vein surgery ABIs in this clinic were 0.96 on the right and 0.88 on the left 10/28; the patient arrives with a small punched out area on the left lateral foot. This appears somewhat better than last week but still with a nonviable surface. I did not feel when I admitted her last week that she had significant arterial disease. She definitely  has chronic venous insufficiency and multiple other rheumatologic issues including rheumatoid arthritis, scleroderma crest syndrome but the exact etiology of this wound is not really clear in its location. We used Santyl 11/4; small punched out area on the left anterior lateral foot. The exact reason for this wound is not really clear. She has venous disease however I am not certain that this explains the presentation like this.  She saw Dr. Aleda Grana of [facility] before she came into this clinic. She does have other possible alternatives for this wound. I do not believe she has an arterial issue however she does have rheumatoid arthritis, crest syndrome etc. I have been concerned to actually biopsy this however unless of course it worsens Objective Constitutional Patient is hypertensive.. Pulse regular and within target range for patient.Marland Kitchen Respirations regular, non-labored and within target range.. Temperature is normal and within the target range for the patient.Marland Kitchen appears in no distress. Vitals Time Taken: 10:40 AM, Height: 66 in, Weight: 230 lbs, BMI: 37.1, Temperature: 98.7 F, Pulse: 79 bpm, Respiratory Scarfo, Leilanee T. ([id number]) Rate: 18 breaths/min, Blood Pressure: 155/65 mmHg. Cardiovascular Needle pulses are palpable. General Notes: Wound exam; the area in question is on the left lateral foot. Small punched out wound with a completely nonviable surface. This is very painful although not overtly infected. Has tightly adherent necrotic debris probably down into the muscle layer of the foot. But I cannot see any viable tissue. Integumentary (Hair, Skin) Wound #1 status is Open. Original cause of wound was Gradually Appeared. The wound is located on the Left,Lateral Foot. The wound measures 1.3cm length x 0.8cm width x 0.5cm depth; 0.817cm^2 area and 0.408cm^3 volume. There is Fat Layer (Subcutaneous Tissue) Exposed exposed. There is no tunneling or undermining noted. There is a  medium amount of serosanguineous drainage noted. The wound margin is flat and intact. There is small (1-33%) red granulation within the wound bed. There is a large (67-100%) amount of necrotic tissue within the wound bed including Eschar and Adherent Slough. Assessment Active Problems ICD-10 Type 2 diabetes mellitus with foot ulcer Chronic venous hypertension (idiopathic) with inflammation of bilateral lower extremity Non-pressure chronic ulcer of other part of left foot with other specified severity Rheumatoid arthritis with rheumatoid factor of other specified site without organ or systems involvement Procedures Wound #1 Pre-procedure diagnosis of Wound #1 is a Diabetic Wound/Ulcer of the Lower Extremity located on the Left,Lateral Foot .Severity of Tissue Pre Debridement is: Necrosis of muscle. There was a Excisional Skin/Subcutaneous Tissue/Muscle Debridement with a total area of 1.04 sq cm performed by Ricard Dillon, MD. With the following instrument(s): Curette to remove Viable and Non-Viable tissue/material. Material removed includes Muscle, Subcutaneous Tissue, and Slough after achieving pain control using Lidocaine. No specimens were taken. A time out was conducted at 10:51, prior to the start of the procedure. A Minimum amount of bleeding was controlled with Pressure. The procedure was tolerated well. Post Debridement Measurements: 1.3cm length x 0.8cm width x 0.7cm depth; 0.572cm^3 volume. Character of Wound/Ulcer Post Debridement is stable. Severity of Tissue Post Debridement is: Muscle involvement without necrosis. Post procedure Diagnosis Wound #1: Same as Pre-Procedure Plan AKEIBA, LUBRANO. ([id number]) Wound Cleansing: Wound #1 Left,Lateral Foot: Clean wound with Normal Saline. May Shower, gently pat wound dry prior to applying new dressing. Anesthetic (add to Medication List): Wound #1 Left,Lateral Foot: Topical Lidocaine 4% cream applied to wound bed prior to  debridement (In Clinic Only). Primary Wound Dressing: Wound #1 Left,Lateral Foot: Santyl Ointment Secondary Dressing: Wound #1 Left,Lateral Foot: ABD and Kerlix/Conform Dressing Change Frequency: Wound #1 Left,Lateral Foot: Change dressing every day. Follow-up Appointments: Wound #1 Left,Lateral Foot: Return Appointment in 1 week. Edema Control: Wound #1 Left,Lateral Foot: Patient to wear own compression stockings Elevate legs to the level of the heart and pump ankles as often as possible Off-Loading: Wound #1 Left,Lateral Foot: Other: - keep pressure off of wound  area Additional Orders / Instructions: Wound #1 Left,Lateral Foot: Activity as tolerated Medications-please add to medication list.: Wound #1 Left,Lateral Foot: Santyl Enzymatic Ointment #1 I am continue with Santyl after a difficult debridement 2. If I can get this wound surface healthy I will switch to endoform/collagen or an advanced treatment option 3. The exact etiology of this area is unclear. I do not think she has an arterial issue her peripheral pulses are palpable. Although we may need to do formal arterial studies here #4 need to consider a biopsy if this wound continues to stall Electronic Signature(s) Signed: 03/16/2019 11:15:05 AM By: Linton Ham MD Entered By: Linton Ham on 03/16/2019 11:15:05 Nina Perez ([id number]) -------------------------------------------------------------------------------- Caro Details Patient Name: Nina Perez Date of Service: 03/16/2019 Medical Record Number: [id number] Patient Account Number: [id number] Date of Birth/Sex: 1940-06-18 (78 y.o. F) Treating RN: Cornell Barman Primary Care Provider: Einar Pheasant Other Clinician: Referring Provider: Einar Pheasant Treating Provider/Extender: Tito Dine in Treatment: 2 Diagnosis Coding ICD-10 Codes Code Description E11.621 Type 2 diabetes mellitus with foot ulcer I87.323 Chronic venous  hypertension (idiopathic) with inflammation of bilateral lower extremity L97.528 Non-pressure chronic ulcer of other part of left foot with other specified severity M05.7A Rheumatoid arthritis with rheumatoid factor of other specified site without organ or systems involvement Facility Procedures CPT4 Code Description: [id number] - DEB MUSC/FASCIA 20 SQ CM/< ICD-10 Diagnosis Description L97.528 Non-pressure chronic ulcer of other part of left foot with other E11.621 Type 2 diabetes mellitus with foot ulcer Modifier: specified sever Quantity: 1 ity Physician Procedures CPT4 Code Description: [id number] - WC PHYS DEBR MUSCLE/FASCIA 20 SQ CM ICD-10 Diagnosis Description L97.528 Non-pressure chronic ulcer of other part of left foot with other s E11.621 Type 2 diabetes mellitus with foot ulcer Modifier: pecified severi Quantity: 1 ty Electronic Signature(s) Signed: 03/18/2019 7:47:21 AM By: Linton Ham MD Entered By: Linton Ham on 03/16/2019 11:13:42

## 2019-03-18 NOTE — Progress Notes (Signed)
PREETI, HONIG ([id number]) Visit Report for 03/16/2019 Arrival Information Details Patient Name: Nina Perez, Nina Perez Date of Service: 03/16/2019 10:45 AM Medical Record Number: [id number] Patient Account Number: [id number] Date of Birth/Sex: [birth year] (78 y.o. F) Treating RN: Harold Barban Primary Care Arlan Birks: Einar Pheasant Other Clinician: Referring Rene Gonsoulin: Einar Pheasant Treating Jaylend Reiland/Extender: Tito Dine in Treatment: 2 Visit Information History Since Last Visit Added or deleted any medications: No Patient Arrived: Wheel Chair Any new allergies or adverse reactions: No Arrival Time: 10:39 Had a fall or experienced change in No Accompanied By: husband activities of daily living that may affect Transfer Assistance: None risk of falls: Patient Identification Verified: Yes Signs or symptoms of abuse/neglect since last visito No Secondary Verification Process Completed: Yes Hospitalized since last visit: No Has Dressing in Place as Prescribed: Yes Pain Present Now: No Electronic Signature(s) Signed: 03/17/2019 4:15:06 PM By: Harold Barban Entered By: Harold Barban on 03/16/2019 10:40:32 Nina Perez ([id number]) -------------------------------------------------------------------------------- Encounter Discharge Information Details Patient Name: Nina Perez Date of Service: 03/16/2019 10:45 AM Medical Record Number: [id number] Patient Account Number: [id number] Date of Birth/Sex: Sep 10, 1940 (78 y.o. F) Treating RN: Cornell Barman Primary Care Ismaeel Arvelo: Einar Pheasant Other Clinician: Referring Dontario Evetts: Einar Pheasant Treating Taiz Bickle/Extender: Tito Dine in Treatment: 2 Encounter Discharge Information Items Post Procedure Vitals Discharge Condition: Stable Temperature (F): 98.7 Ambulatory Status: Wheelchair Pulse (bpm): 79 Discharge Destination: Home Respiratory Rate (breaths/min): 18 Transportation: Ambulance Blood Pressure (mmHg):  155/65 Accompanied By: husband Schedule Follow-up Appointment: Yes Clinical Summary of Care: Electronic Signature(s) Signed: 03/17/2019 5:19:38 PM By: Gretta Cool, BSN, RN, CWS, Kim RN, BSN Entered By: Gretta Cool, BSN, RN, CWS, Kim on 03/16/2019 10:57:02 Nina Perez ([id number]) -------------------------------------------------------------------------------- Lower Extremity Assessment Details Patient Name: Nina Perez Date of Service: 03/16/2019 10:45 AM Medical Record Number: [id number] Patient Account Number: [id number] Date of Birth/Sex: [birth year] (78 y.o. F) Treating RN: Harold Barban Primary Care Brandley Aldrete: Einar Pheasant Other Clinician: Referring Stacie Knutzen: Einar Pheasant Treating Casady Voshell/Extender: Ricard Dillon Weeks in Treatment: 2 Vascular Assessment Pulses: Dorsalis Pedis Palpable: [Left:Yes] Posterior Tibial Palpable: [Left:Yes] Electronic Signature(s) Signed: 03/17/2019 4:15:06 PM By: Harold Barban Entered By: Harold Barban on 03/16/2019 10:41:32 Nina Perez ([id number]) -------------------------------------------------------------------------------- Multi Wound Chart Details Patient Name: Nina Perez Date of Service: 03/16/2019 10:45 AM Medical Record Number: [id number] Patient Account Number: [id number] Date of Birth/Sex: 1940/12/19 (78 y.o. F) Treating RN: Cornell Barman Primary Care Zayleigh Stroh: Einar Pheasant Other Clinician: Referring Keiley Levey: Einar Pheasant Treating Mikylah Ackroyd/Extender: Tito Dine in Treatment: 2 Vital Signs Height(in): 66 Pulse(bpm): 69 Weight(lbs): 230 Blood Pressure(mmHg): 155/65 Body Mass Index(BMI): 37 Temperature(F): 98.7 Respiratory Rate 18 (breaths/min): Photos: [N/A:N/A] Wound Location: Left Foot - Lateral N/A N/A Wounding Event: Gradually Appeared N/A N/A Primary Etiology: Diabetic Wound/Ulcer of the N/A N/A Lower Extremity Comorbid History: Hypertension, Type II N/A N/A Diabetes, Scleroderma,  Gout, Rheumatoid Arthritis Date Acquired: 01/17/2019 N/A N/A Weeks of Treatment: 2 N/A N/A Wound Status: Open N/A N/A Measurements L x W x D 1.3x0.8x0.5 N/A N/A (cm) Area (cm) : 0.817 N/A N/A Volume (cm) : 0.408 N/A N/A % Reduction in Area: 17.50% N/A N/A % Reduction in Volume: -106.10% N/A N/A Classification: Grade 1 N/A N/A Exudate Amount: Medium N/A N/A Exudate Type: Serosanguineous N/A N/A Exudate Color: red, brown N/A N/A Wound Margin: Flat and Intact N/A N/A Granulation Amount: Small (1-33%) N/A N/A Granulation Quality: Red N/A N/A Necrotic Amount: Large (67-100%) N/A N/A Necrotic Tissue: Eschar, Adherent Slough N/A N/A Exposed Structures: Fat  Layer (Subcutaneous N/A N/A Tissue) Exposed: Yes Fascia: No Tendon: No SHARMAINE, HORSTMEYER ([id number]) Muscle: No Joint: No Bone: No Epithelialization: None N/A N/A Debridement: Debridement - Excisional N/A N/A Pre-procedure 10:51 N/A N/A Verification/Time Out Taken: Pain Control: Lidocaine N/A N/A Tissue Debrided: Muscle, Subcutaneous, N/A N/A Slough Level: Skin/Subcutaneous N/A N/A Tissue/Muscle Debridement Area (sq cm): 1.04 N/A N/A Instrument: Curette N/A N/A Bleeding: Minimum N/A N/A Hemostasis Achieved: Pressure N/A N/A Debridement Treatment Procedure was tolerated well N/A N/A Response: Post Debridement 1.3x0.8x0.7 N/A N/A Measurements L x W x D (cm) Post Debridement Volume: 0.572 N/A N/A (cm) Procedures Performed: Debridement N/A N/A Treatment Notes Wound #1 (Left, Lateral Foot) Notes santyl, abd, conform Electronic Signature(s) Signed: 03/18/2019 7:47:21 AM By: Linton Ham MD Entered By: Linton Ham on 03/16/2019 11:08:06 Nina Perez ([id number]) -------------------------------------------------------------------------------- Multi-Disciplinary Care Plan Details Patient Name: Nina Perez Date of Service: 03/16/2019 10:45 AM Medical Record Number: [id number] Patient Account Number:  [id number] Date of Birth/Sex: [birth year] (78 y.o. F) Treating RN: Cornell Barman Primary Care Sherian Valenza: Einar Pheasant Other Clinician: Referring Kaden Dunkel: Einar Pheasant Treating Jos Cygan/Extender: Tito Dine in Treatment: 2 Active Inactive Abuse / Safety / Falls / Self Care Management Nursing Diagnoses: Impaired physical mobility Potential for falls Goals: Patient will remain injury free related to falls Date Initiated: 03/02/2019 Target Resolution Date: 03/16/2019 Goal Status: Active Interventions: Assess fall risk on admission and as needed Notes: Medication Nursing Diagnoses: Knowledge deficit related to medication safety: actual or potential Goals: Patient/caregiver will demonstrate understanding of all current medications Date Initiated: 03/02/2019 Target Resolution Date: 03/17/2019 Goal Status: Active Interventions: Assess for medication contraindications each visit where new medications are prescribed Notes: Necrotic Tissue Nursing Diagnoses: Impaired tissue integrity related to necrotic/devitalized tissue Goals: Necrotic/devitalized tissue will be minimized in the wound bed Date Initiated: 03/02/2019 Target Resolution Date: 03/17/2019 Goal Status: Active Interventions: GUADLUPE, HOLTAN ([id number]) Assess patient pain level pre-, during and post procedure and prior to discharge Treatment Activities: Apply topical anesthetic as ordered : 03/02/2019 Notes: Orientation to the Wound Care Program Nursing Diagnoses: Knowledge deficit related to the wound healing center program Goals: Patient/caregiver will verbalize understanding of the [facility] Date Initiated: 03/02/2019 Target Resolution Date: 03/17/2019 Goal Status: Active Interventions: Provide education on orientation to the wound center Notes: Venous Leg Ulcer Nursing Diagnoses: Actual venous Insuffiency (use after diagnosis is confirmed) Goals: Patient/caregiver will  verbalize understanding of disease process and disease management Date Initiated: 03/02/2019 Target Resolution Date: 03/17/2019 Goal Status: Active Verify adequate tissue perfusion prior to therapeutic compression application Date Initiated: 03/02/2019 Target Resolution Date: 03/17/2019 Goal Status: Active Interventions: Assess peripheral edema status every visit. Provide education on venous insufficiency Notes: Wound/Skin Impairment Nursing Diagnoses: Impaired tissue integrity Goals: Ulcer/skin breakdown will have a volume reduction of 30% by week 4 Date Initiated: 03/02/2019 Target Resolution Date: 04/02/2019 Goal Status: Active Interventions: Assess ulceration(s) every visit MCKALA, FONTENOT ([id number]) Treatment Activities: Topical wound management initiated : 03/02/2019 Notes: Electronic Signature(s) Signed: 03/17/2019 5:19:38 PM By: Gretta Cool, BSN, RN, CWS, Kim RN, BSN Entered By: Gretta Cool, BSN, RN, CWS, Kim on 03/16/2019 10:52:00 Nina Perez ([id number]) -------------------------------------------------------------------------------- Pain Assessment Details Patient Name: Nina Perez Date of Service: 03/16/2019 10:45 AM Medical Record Number: [id number] Patient Account Number: [id number] Date of Birth/Sex: 1941/02/23 (78 y.o. F) Treating RN: Harold Barban Primary Care Noriel Guthrie: Einar Pheasant Other Clinician: Referring Ruperto Kiernan: Einar Pheasant Treating Jung Ingerson/Extender: Tito Dine in Treatment: 2 Active Problems Location of Pain Severity and Description  Layer (Subcutaneous N/A N/A Tissue) Exposed: Yes Fascia: No Tendon: No SHARMAINE, HORSTMEYER ([id number]) Muscle: No Joint: No Bone: No Epithelialization: None N/A N/A Debridement: Debridement - Excisional N/A N/A Pre-procedure 10:51 N/A N/A Verification/Time Out Taken: Pain Control: Lidocaine N/A N/A Tissue Debrided: Muscle, Subcutaneous, N/A N/A Slough Level: Skin/Subcutaneous N/A N/A Tissue/Muscle Debridement Area (sq cm): 1.04 N/A N/A Instrument: Curette N/A N/A Bleeding: Minimum N/A N/A Hemostasis Achieved: Pressure N/A N/A Debridement Treatment Procedure was tolerated well N/A N/A Response: Post Debridement 1.3x0.8x0.7 N/A N/A Measurements L x W x D (cm) Post Debridement Volume: 0.572 N/A N/A (cm) Procedures Performed: Debridement N/A N/A Treatment Notes Wound #1 (Left, Lateral Foot) Notes santyl, abd, conform Electronic Signature(s) Signed: 03/18/2019 7:47:21 AM By: Linton Ham MD Entered By: Linton Ham on 03/16/2019 11:08:06 Nina Perez ([id number]) -------------------------------------------------------------------------------- Multi-Disciplinary Care Plan Details Patient Name: Nina Perez Date of Service: 03/16/2019 10:45 AM Medical Record Number: [id number] Patient Account Number:  [id number] Date of Birth/Sex: [birth year] (78 y.o. F) Treating RN: Cornell Barman Primary Care Sherian Valenza: Einar Pheasant Other Clinician: Referring Kaden Dunkel: Einar Pheasant Treating Jos Cygan/Extender: Tito Dine in Treatment: 2 Active Inactive Abuse / Safety / Falls / Self Care Management Nursing Diagnoses: Impaired physical mobility Potential for falls Goals: Patient will remain injury free related to falls Date Initiated: 03/02/2019 Target Resolution Date: 03/16/2019 Goal Status: Active Interventions: Assess fall risk on admission and as needed Notes: Medication Nursing Diagnoses: Knowledge deficit related to medication safety: actual or potential Goals: Patient/caregiver will demonstrate understanding of all current medications Date Initiated: 03/02/2019 Target Resolution Date: 03/17/2019 Goal Status: Active Interventions: Assess for medication contraindications each visit where new medications are prescribed Notes: Necrotic Tissue Nursing Diagnoses: Impaired tissue integrity related to necrotic/devitalized tissue Goals: Necrotic/devitalized tissue will be minimized in the wound bed Date Initiated: 03/02/2019 Target Resolution Date: 03/17/2019 Goal Status: Active Interventions: GUADLUPE, HOLTAN ([id number]) Assess patient pain level pre-, during and post procedure and prior to discharge Treatment Activities: Apply topical anesthetic as ordered : 03/02/2019 Notes: Orientation to the Wound Care Program Nursing Diagnoses: Knowledge deficit related to the wound healing center program Goals: Patient/caregiver will verbalize understanding of the [facility] Date Initiated: 03/02/2019 Target Resolution Date: 03/17/2019 Goal Status: Active Interventions: Provide education on orientation to the wound center Notes: Venous Leg Ulcer Nursing Diagnoses: Actual venous Insuffiency (use after diagnosis is confirmed) Goals: Patient/caregiver will  verbalize understanding of disease process and disease management Date Initiated: 03/02/2019 Target Resolution Date: 03/17/2019 Goal Status: Active Verify adequate tissue perfusion prior to therapeutic compression application Date Initiated: 03/02/2019 Target Resolution Date: 03/17/2019 Goal Status: Active Interventions: Assess peripheral edema status every visit. Provide education on venous insufficiency Notes: Wound/Skin Impairment Nursing Diagnoses: Impaired tissue integrity Goals: Ulcer/skin breakdown will have a volume reduction of 30% by week 4 Date Initiated: 03/02/2019 Target Resolution Date: 04/02/2019 Goal Status: Active Interventions: Assess ulceration(s) every visit MCKALA, FONTENOT ([id number]) Treatment Activities: Topical wound management initiated : 03/02/2019 Notes: Electronic Signature(s) Signed: 03/17/2019 5:19:38 PM By: Gretta Cool, BSN, RN, CWS, Kim RN, BSN Entered By: Gretta Cool, BSN, RN, CWS, Kim on 03/16/2019 10:52:00 Nina Perez ([id number]) -------------------------------------------------------------------------------- Pain Assessment Details Patient Name: Nina Perez Date of Service: 03/16/2019 10:45 AM Medical Record Number: [id number] Patient Account Number: [id number] Date of Birth/Sex: 1941/02/23 (78 y.o. F) Treating RN: Harold Barban Primary Care Noriel Guthrie: Einar Pheasant Other Clinician: Referring Ruperto Kiernan: Einar Pheasant Treating Jung Ingerson/Extender: Tito Dine in Treatment: 2 Active Problems Location of Pain Severity and Description

## 2019-03-23 ENCOUNTER — Ambulatory Visit (INDEPENDENT_AMBULATORY_CARE_PROVIDER_SITE_OTHER): Payer: Medicare Other | Admitting: Internal Medicine

## 2019-03-23 ENCOUNTER — Encounter: Payer: Medicare Other | Admitting: Internal Medicine

## 2019-03-23 ENCOUNTER — Other Ambulatory Visit: Payer: Self-pay

## 2019-03-23 DIAGNOSIS — E7800 Pure hypercholesterolemia, unspecified: Secondary | ICD-10-CM | POA: Diagnosis not present

## 2019-03-23 DIAGNOSIS — E11621 Type 2 diabetes mellitus with foot ulcer: Secondary | ICD-10-CM | POA: Diagnosis not present

## 2019-03-23 DIAGNOSIS — K2100 Gastro-esophageal reflux disease with esophagitis, without bleeding: Secondary | ICD-10-CM | POA: Diagnosis not present

## 2019-03-23 DIAGNOSIS — E119 Type 2 diabetes mellitus without complications: Secondary | ICD-10-CM | POA: Diagnosis not present

## 2019-03-23 DIAGNOSIS — K2270 Barrett's esophagus without dysplasia: Secondary | ICD-10-CM

## 2019-03-23 DIAGNOSIS — M069 Rheumatoid arthritis, unspecified: Secondary | ICD-10-CM

## 2019-03-23 DIAGNOSIS — I890 Lymphedema, not elsewhere classified: Secondary | ICD-10-CM

## 2019-03-23 DIAGNOSIS — I10 Essential (primary) hypertension: Secondary | ICD-10-CM

## 2019-03-23 DIAGNOSIS — D696 Thrombocytopenia, unspecified: Secondary | ICD-10-CM

## 2019-03-23 DIAGNOSIS — S91302D Unspecified open wound, left foot, subsequent encounter: Secondary | ICD-10-CM

## 2019-03-23 MED ORDER — TRAMADOL HCL 50 MG PO TABS: 50.0000 mg | ORAL_TABLET | Freq: Three times a day (TID) | ORAL | 0 refills | Status: DC | PRN

## 2019-03-23 NOTE — Progress Notes (Signed)
Patient ID: Nina Perez, female   DOB: 05-14-1940, 78 y.o.   MRN: [id number]   Subjective:    Patient ID: Nina Perez, female    DOB: 11/21/1940, 78 y.o.   MRN: [id number]  HPI  Patient here for a scheduled follow up.  She is accompanied by her husband.  History obtained from both of them.  Main complaint is persistent wound left foot. Being followed at wound clinic.  Reports increased pain.  Not sleeping well because of the pain.  Reports wound clinic unable to prescribe pain medication.  Increased stress related to persistent wound and pain.  Husband changes dressing daily.  States otherwise she feels she is doing well.  No chest pain.  No sob.  No acid reflux.  No abdominal pain.  Bowels moving.  Sees Dr Jefm Bryant for f/u inflammatory arthritis.  On MTX.     Past Medical History:  Diagnosis Date  . Barrett's esophagus   . Cancer ([place])    skin ca  . Diabetes mellitus ([place])   . Gout   . History of colon cancer    adenomatous polyps  . Hypercholesterolemia   . Hypertension   . Inflammatory arthritis   . Osteoarthritis    knees, spine  . Phlebitis    x2 (with pregnancy)  . 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 Pulmonary hypertension ([place])   . Reflux esophagitis   . Rheumatoid arthritis(714.0)    positive RF, FANA, RNP, negative CCP ab and anti DNA,.  neg anti-SCL 70  . 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 BREAST CYST ASPIRATION Left    neg  . VEIN LIGATION AND STRIPPING     Family History  Problem Relation Age of Onset  . Arthritis Mother   . Heart attack Father   . Colon cancer Neg Hx   . Breast cancer Neg Hx    Social History   Socioeconomic History  . Marital status: Married    Spouse name: Not on file  . Number of children: Not on file  . Years of education: Not on file  . Highest education level: Not on file  Occupational History  . Not on file  Social Needs  . Financial  resource strain: Not hard at all  . Food insecurity    Worry: Never true    Inability: Never true  . Transportation needs    Medical: No    Non-medical: No  Tobacco Use  . Smoking status: Former Smoker    Packs/day: 0.50    Years: 15.00    Pack years: 7.50    Quit date: 05/12/1988    Years since quitting: 30.8  . Smokeless tobacco: Never Used  Substance and Sexual Activity  . Alcohol use: No    Alcohol/week: 0.0 standard drinks  . Drug use: No  . Sexual activity: Yes  Lifestyle  . Physical activity    Days per week: Not on file    Minutes per session: Not on file  . Stress: Only a little  Relationships  . Social Herbalist on phone: Not on file    Gets together: Not on file    Attends religious service: Not on file    Active member of club or organization: Not on file    Attends meetings of clubs or organizations: Not on file    Relationship status: Not on file  Other  Topics Concern  . Not on file  Social History Narrative  . Not on file    Outpatient Encounter Medications as of 03/23/2019  Medication Sig  . allopurinol (ZYLOPRIM) 300 MG tablet Take 1 tablet by mouth once daily  . amLODipine (NORVASC) 10 MG tablet TAKE 1 TABLET BY MOUTH ONCE DAILY  . aspirin 81 MG tablet Take 81 mg by mouth daily. Reported on 07/24/2015  . diclofenac sodium (VOLTAREN) 1 % GEL Apply 4 g topically 4 (four) times daily.  . fluticasone (FLONASE) 50 MCG/ACT nasal spray Place 2 sprays into the nose daily.  . folic acid (FOLVITE) 1 MG tablet Take 1 mg by mouth daily.  Marland Kitchen glucose blood (BAYER CONTOUR TEST) test strip USE  STRIP TO CHECK GLUCOSE TWICE DAILY. Dx: E11.9  . lidocaine (LIDODERM) 5 % Place 1 patch onto the skin daily. Remove & Discard patch within 12 hours.  Marland Kitchen losartan (COZAAR) 100 MG tablet Take 1 tablet by mouth once daily  . metFORMIN (GLUCOPHAGE) 500 MG tablet Take 1 tablet by mouth once daily  . methotrexate 2.5 MG tablet Take 6 tablets q week  . metoprolol succinate  (TOPROL-XL) 50 MG 24 hr tablet TAKE 1 TABLET BY MOUTH ONCE DAILY IMMEDIATELY FOLLOWING A MEAL  . MICROLET LANCETS MISC Check sugar once daily, [employer] Microlet Lancets. Dx E11.9  . naproxen (NAPROSYN) 500 MG tablet Take 500 mg by mouth 2 (two) times daily as needed.  . nystatin cream (MYCOSTATIN) Apply to affected area bid  . omeprazole (PRILOSEC) 20 MG capsule Take 20 mg by mouth daily.  Marland Kitchen spironolactone (ALDACTONE) 25 MG tablet Take 1 tablet by mouth once daily  . traMADol (ULTRAM) 50 MG tablet Take 1 tablet (50 mg total) by mouth every 8 (eight) hours as needed.   No facility-administered encounter medications on file as of 03/23/2019.    Review of Systems  Constitutional: Negative for appetite change and unexpected weight change.  HENT: Negative for congestion and sinus pressure.   Respiratory: Negative for cough, chest tightness and shortness of breath.   Cardiovascular: Positive for leg swelling. Negative for chest pain and palpitations.  Gastrointestinal: Negative for abdominal pain, diarrhea, nausea and vomiting.  Genitourinary: Negative for difficulty urinating and dysuria.  Musculoskeletal: Negative for joint swelling and myalgias.  Skin: Negative for color change and rash.  Neurological: Negative for dizziness, light-headedness and headaches.  Psychiatric/Behavioral: Negative for agitation and dysphoric mood.       Objective:    Physical Exam Constitutional:      General: She is not in acute distress.    Appearance: Normal appearance.  HENT:     Head: Normocephalic and atraumatic.     Right Ear: External ear normal.     Left Ear: External ear normal.  Eyes:     General: No scleral icterus.       Right eye: No discharge.        Left eye: No discharge.     Conjunctiva/sclera: Conjunctivae normal.  Neck:     Musculoskeletal: Neck supple. No muscular tenderness.     Thyroid: No thyromegaly.  Cardiovascular:     Rate and Rhythm: Normal rate and regular rhythm.   Pulmonary:     Effort: No respiratory distress.     Breath sounds: Normal breath sounds. No wheezing.  Abdominal:     General: Bowel sounds are normal.     Palpations: Abdomen is soft.     Tenderness: There is no abdominal tenderness.  Musculoskeletal:  General: No tenderness.     Comments: Increased pedal and lower extremity swelling.    Lymphadenopathy:     Cervical: No cervical adenopathy.  Skin:    Comments: Small open wound left foot.  Increased pain to palpation.    Neurological:     Mental Status: She is alert.  Psychiatric:        Mood and Affect: Mood normal.        Behavior: Behavior normal.     BP 126/78   Pulse 70   Temp (!) 96.7 F (35.9 C)   Resp 16   Wt 234 lb 3.2 oz (106.2 kg)   LMP 05/25/1988   SpO2 96%   BMI 37.80 kg/m  Wt Readings from Last 3 Encounters:  03/23/19 234 lb 3.2 oz (106.2 kg)  06/30/18 242 lb 9.6 oz (110 kg)  06/16/18 242 lb 8 oz (110 kg)     Lab Results  Component Value Date   WBC 4.7 06/22/2018   HGB 13.2 06/22/2018   HCT 41.6 06/22/2018   PLT 196.0 06/22/2018   GLUCOSE 106 (H) 02/10/2019   CHOL 151 02/10/2019   TRIG 146.0 02/10/2019   HDL 39.80 02/10/2019   LDLCALC 82 02/10/2019   ALT 15 02/10/2019   AST 18 02/10/2019   NA 139 02/10/2019   K 4.5 02/10/2019   CL 104 02/10/2019   CREATININE 1.05 02/10/2019   BUN 20 02/10/2019   CO2 27 02/10/2019   TSH 5.90 (H) 02/10/2019   HGBA1C 6.3 02/10/2019   MICROALBUR 7.6 (H) 02/10/2019    Dg Foot Complete Left  Result Date: 03/02/2019 CLINICAL DATA:  LEFT foot infection for 6 weeks at third toe EXAM: LEFT FOOT - COMPLETE 3+ VIEW COMPARISON:  None FINDINGS: Osseous demineralization. Joint spaces preserved. Soft tissue swelling at dorsum of forefoot. No acute fracture, dislocation, or bone destruction. IMPRESSION: No acute osseous abnormalities. If there is persistent clinical concern for osteomyelitis, or for deep soft tissue infection, consider MR assessment.  Electronically Signed   By: Lavonia Dana M.D.   On: 03/02/2019 13:51       Assessment & Plan:   Problem List Items Addressed This Visit    Barrett's esophagus    Followed by GI.  No symptoms now.        Diabetes mellitus ([place])    Low carb diet and exercise.  Follow met b and a1c.        Relevant Orders   Hemoglobin A1c   GERD (gastroesophageal reflux disease)    Controlled.        Hypercholesterolemia    Low cholesterol diet and exercise.  Discussed starting statin medication.  Discussed calculated cholesterol risk.  Will hold until can get wound controlled.  Follow.        Relevant Orders   Hepatic function panel   Lipid panel   Hypertension    Blood pressure under good control.  Continue same medication regimen.  Follow pressures.  Follow metabolic panel.        Relevant Orders   CBC with Differential/Platelet   TSH   Basic metabolic panel (future)   Lymphedema    Wearing compression hose.  Has been followed by vascular surgery.       Open wound of left foot    Persistent wound - left foot.  Increased pain.  Limiting her sleep, etc.  Seeing wound clinic.  Taking tylenol.  Needs something for pain.  Discussed treatment options.  She is unable to  take antiinflammatories.  Continue tylenol.  Tramadol.  Checked dataase.  Follow.        Rheumatoid arthritis ([place])    Followed by Dr Jefm Bryant.  On MTX.        Relevant Medications   traMADol (ULTRAM) 50 MG tablet   Thrombocytopenia ([facility])    Follow cbc.           Einar Pheasant, MD

## 2019-03-24 NOTE — Progress Notes (Signed)
LEJLA, TAFLINGER ([id number]) Visit Report for 03/23/2019 Debridement Details Patient Name: Nina Perez, Nina Perez Date of Service: 03/23/2019 12:45 PM Medical Record Number: [id number] Patient Account Number: [id number] Date of Birth/Sex: Oct 24, 1940 (78 y.o. F) Treating RN: Cornell Barman Primary Care Provider: Einar Pheasant Other Clinician: Referring Provider: Einar Pheasant Treating Provider/Extender: Tito Dine in Treatment: 3 Debridement Performed for Wound #1 Left,Lateral Foot Assessment: Performed By: Physician Ricard Dillon, MD Debridement Type: Debridement Severity of Tissue Pre Fat layer exposed Debridement: Level of Consciousness (Pre- Awake and Alert procedure): Pre-procedure Verification/Time Yes - 12:55 Out Taken: Start Time: 12:55 Pain Control: Lidocaine Total Area Debrided (L x W): 1.8 (cm) x 0.8 (cm) = 1.44 (cm) Tissue and other material Viable, Non-Viable, Slough, Subcutaneous, Slough debrided: Level: Skin/Subcutaneous Tissue Debridement Description: Excisional Instrument: Curette Bleeding: Minimum Hemostasis Achieved: Pressure End Time: 12:57 Response to Treatment: Procedure was tolerated well Level of Consciousness Awake and Alert (Post-procedure): Post Debridement Measurements of Total Wound Length: (cm) 1.8 Width: (cm) 0.8 Depth: (cm) 0.3 Volume: (cm) 0.339 Character of Wound/Ulcer Post Debridement: Stable Severity of Tissue Post Debridement: Fat layer exposed Post Procedure Diagnosis Same as Pre-procedure Electronic Signature(s) Signed: 03/23/2019 5:21:25 PM By: Gretta Cool, BSN, RN, CWS, Kim RN, BSN Signed: 03/23/2019 5:41:49 PM By: Linton Ham MD Entered By: Linton Ham on 03/23/2019 13:21:07 Nina Perez ([id number]) -------------------------------------------------------------------------------- HPI Details Patient Name: Nina Perez Date of Service: 03/23/2019 12:45 PM Medical Record Number: [id number] Patient Account  Number: [id number] Date of Birth/Sex: [birth year] (78 y.o. F) Treating RN: Cornell Barman Primary Care Provider: Einar Pheasant Other Clinician: Referring Provider: Einar Pheasant Treating Provider/Extender: Tito Dine in Treatment: 3 History of Present Illness HPI Description: ADMISSION 03/02/2019 Mrs. Sinden is a 78 year old woman with a complicated past medical history who presents for review of wound on the left lateral foot for the last 6 weeks. She states she simply looked down and saw a black spot on the left lateral foot and thought this was dirt went to wipe it off and there was an open wound. This is not gotten any better and over the timeframe. She has been using topical triple antibiotic ointment. She has a history of chronic venous insufficiency and has had wounds on her lower legs related to this but never on her feet. She is known to Dr. Aleda Grana at [facility] in [place] and she went to see him about this. He did not order any specific tests and said that he was not exactly sure what it because this per the patient's description. She was put in an [employer] but it looked as though this was stopped on 10/8. She had a week's worth of doxycycline as well. Patient states the area is very painful rating it at a 10 out of 10. She does not really describe claudication element. 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 X-ray we ordered last week was negative for any bony abnormality The patient had venous studies on 06/16/2018 in the [facility] system. There was no thrombosis and they did not comment on reflux. She has had previous vein surgery ABIs in this clinic were 0.96 on the right and 0.88 on the left 10/28; the patient arrives with a small punched out area on the left lateral  foot. This appears somewhat better than last week but still with a nonviable  surface. I did not feel when I admitted her last week that she had significant arterial disease. She definitely has chronic venous insufficiency and multiple other rheumatologic issues including rheumatoid arthritis, scleroderma crest syndrome but the exact etiology of this wound is not really clear in its location. We used Santyl 11/4; small punched out area on the left anterior lateral foot. The exact reason for this wound is not really clear. She has venous disease however I am not certain that this explains the presentation like this. She saw Dr. Aleda Grana of [facility] before she came into this clinic. She does have other possible alternatives for this wound. I do not believe she has an arterial issue however she does have rheumatoid arthritis, crest syndrome etc. I have been concerned to actually biopsy this however unless of course it worsens 11/11; small punched out area on the left anterior lateral foot. The cause of this is never really clear. She has venous disease previous venous stripping and was followed by Dr. Aleda Grana of [facility]. She saw Dr. Ozella Almond office in 2018 once. She does not want to go back there. She was not felt to have a DVT or evidence of reflux at that time. Although it was difficult to sort out I really do not believe this lady has had formal arterial studies. The area on the left lateral foot still has debris requiring debridement. This is disappointing she has been using Santyl. Electronic Signature(s) Signed: 03/23/2019 5:41:49 PM By: Linton Ham MD Entered By: Linton Ham on 03/23/2019 13:22:37 Nina Perez ([id number]) -------------------------------------------------------------------------------- Physical Exam Details Patient Name: Nina Perez Date of Service: 03/23/2019 12:45 PM Medical Record Number: [id number] Patient Account Number:  [id number] Date of Birth/Sex: 1941-04-26 (78 y.o. F) Treating RN: Cornell Barman Primary Care Provider: Einar Pheasant Other Clinician: Referring Provider: Einar Pheasant Treating Provider/Extender: Tito Dine in Treatment: 3 Constitutional Patient is hypertensive.. Pulse regular and within target range for patient.Marland Kitchen Respirations regular, non-labored and within target range.. Temperature is normal and within the target range for the patient.Marland Kitchen appears in no distress. Cardiovascular Salas pedis and posterior tibial pulses are palpable but certainly not robust. Changes of chronic venous insufficiency with lymphedema in the left leg. Notes Wound exam; the area in question is on the left lateral foot. Small punched out wound. The surface is still not viable but it debrides easily with a #3 curette. I am able to get the viable tissue this week which is a big improvement. Electronic Signature(s) Signed: 03/23/2019 5:41:49 PM By: Linton Ham MD Entered By: Linton Ham on 03/23/2019 13:24:03 Nina Perez ([id number]) -------------------------------------------------------------------------------- Physician Orders Details Patient Name: Nina Perez Date of Service: 03/23/2019 12:45 PM Medical Record Number: [id number] Patient Account Number: [id number] Date of Birth/Sex: 02-07-1941 (78 y.o. F) Treating RN: Cornell Barman Primary Care Provider: Einar Pheasant Other Clinician: Referring Provider: Einar Pheasant Treating Provider/Extender: Tito Dine in Treatment: 3 Verbal / Phone Orders: No Diagnosis Coding Wound Cleansing Wound #1 Left,Lateral Foot o Clean wound with Normal Saline. o May Shower, gently pat wound dry prior to applying new dressing. Anesthetic (add to Medication List) Wound #1 Left,Lateral Foot o Topical Lidocaine 4% cream applied to wound bed prior to debridement (In Clinic Only). Primary Wound Dressing Wound #1 Left,Lateral Foot o  Santyl Ointment Secondary Dressing Wound #1 Left,Lateral Foot o ABD and Kerlix/Conform Dressing Change Frequency Wound #1 Left,Lateral Foot o Change dressing every day. Follow-up Appointments Wound #1 Left,Lateral Foot o  Return Appointment in 1 week. Edema Control Wound #1 Left,Lateral Foot o Patient to wear own compression stockings o Elevate legs to the level of the heart and pump ankles as often as possible Off-Loading Wound #1 Left,Lateral Foot o Other: - keep pressure off of wound area Additional Orders / Instructions Wound #1 Left,Lateral Foot o Activity as tolerated Medications-please add to medication list. Wound #1 Left,Lateral Foot Gathers, Nina T. ([id number]) o Santyl Enzymatic Ointment Services and Therapies o Arterial Studies- Bilateral - Open wound of left foot. Electronic Signature(s) Signed: 03/23/2019 5:21:25 PM By: Gretta Cool, BSN, RN, CWS, Kim RN, BSN Signed: 03/23/2019 5:41:49 PM By: Linton Ham MD Entered By: Gretta Cool, BSN, RN, CWS, Kim on 03/23/2019 13:02:38 Nina Perez, Nina Perez ([id number]) -------------------------------------------------------------------------------- Problem List Details Patient Name: Nina Perez Date of Service: 03/23/2019 12:45 PM Medical Record Number: [id number] Patient Account Number: [id number] Date of Birth/Sex: 01-11-1941 (78 y.o. F) Treating RN: Cornell Barman Primary Care Provider: Einar Pheasant Other Clinician: Referring Provider: Einar Pheasant Treating Provider/Extender: Tito Dine in Treatment: 3 Active Problems ICD-10 Evaluated Encounter Code Description Active Date Today Diagnosis E11.621 Type 2 diabetes mellitus with foot ulcer 03/02/2019 No Yes I87.323 Chronic venous hypertension (idiopathic) with inflammation of 03/02/2019 No Yes bilateral lower extremity L97.528 Non-pressure chronic ulcer of other part of left foot with other 03/02/2019 No Yes specified severity M05.7A Rheumatoid  arthritis with rheumatoid factor of other specified 03/02/2019 No Yes site without organ or systems involvement Inactive Problems Resolved Problems Electronic Signature(s) Signed: 03/23/2019 5:41:49 PM By: Linton Ham MD Entered By: Linton Ham on 03/23/2019 13:20:47 Nina Perez ([id number]) -------------------------------------------------------------------------------- Progress Note Details Patient Name: Nina Perez Date of Service: 03/23/2019 12:45 PM Medical Record Number: [id number] Patient Account Number: [id number] Date of Birth/Sex: 1940/07/31 (78 y.o. F) Treating RN: Cornell Barman Primary Care Provider: Einar Pheasant Other Clinician: Referring Provider: Einar Pheasant Treating Provider/Extender: Tito Dine in Treatment: 3 Subjective History of Present Illness (HPI) ADMISSION 03/02/2019 Mrs. Ratay is a 78 year old woman with a complicated past medical history who presents for review of wound on the left lateral foot for the last 6 weeks. She states she simply looked down and saw a black spot on the left lateral foot and thought this was dirt went to wipe it off and there was an open wound. This is not gotten any better and over the timeframe. She has been using topical triple antibiotic ointment. She has a history of chronic venous insufficiency and has had wounds on her lower legs related to this but never on her feet. She is known to Dr. Aleda Grana at [facility] in [place] and she went to see him about this. He did not order any specific tests and said that he was not exactly sure what it because this per the patient's description. She was put in an [employer] but it looked as though this was stopped on 10/8. She had a week's worth of doxycycline as well. Patient states the area is very painful rating it at a 10 out of 10. She does not really describe claudication element. Past medical history; scleroderma Crest syndromes, pulmonary  hypertension, rheumatoid arthritis, chronic hypoxemic respiratory failure, type 2 diabetes on oral agents hemoglobin A1c at 6.3, she has a history of skin cancer. X-ray we ordered last week was negative for any bony abnormality The patient had venous studies on 06/16/2018 in the [facility] system. There was no thrombosis and they did not comment on reflux. She has  had previous vein surgery ABIs in this clinic were 0.96 on the right and 0.88 on the left 10/28; the patient arrives with a small punched out area on the left lateral foot. This appears somewhat better than last week but still with a nonviable surface. I did not feel when I admitted her last week that she had significant arterial disease. She definitely has chronic venous insufficiency and multiple other rheumatologic issues including rheumatoid arthritis, scleroderma crest syndrome but the exact etiology of this wound is not really clear in its location. We used Santyl 11/4; small punched out area on the left anterior lateral foot. The exact reason for this wound is not really clear. She has venous disease however I am not certain that this explains the presentation like this. She saw Dr. Aleda Grana of [facility] before she came into this clinic. She does have other possible alternatives for this wound. I do not believe she has an arterial issue however she does have rheumatoid arthritis, crest syndrome etc. I have been concerned to actually biopsy this however unless of course it worsens 11/11; small punched out area on the left anterior lateral foot. The cause of this is never really clear. She has venous disease previous venous stripping and was followed by Dr. Aleda Grana of [facility]. She saw Dr. Ozella Almond office in 2018 once. She does not want to go back there. She was not felt to have a DVT or evidence of reflux at that time. Although it was difficult to sort out I really do not believe this lady has had formal  arterial studies. The area on the left lateral foot still has debris requiring debridement. This is disappointing she has been using Santyl. Nina Perez, Nina Perez ([id number]) Objective Constitutional Patient is hypertensive.. Pulse regular and within target range for patient.Marland Kitchen Respirations regular, non-labored and within target range.. Temperature is normal and within the target range for the patient.Marland Kitchen appears in no distress. Vitals Time Taken: 12:35 PM, Height: 66 in, Weight: 230 lbs, BMI: 37.1, Temperature: 98.5 F, Pulse: 70 bpm, Respiratory Rate: 18 breaths/min, Blood Pressure: 162/69 mmHg. Cardiovascular Salas pedis and posterior tibial pulses are palpable but certainly not robust. Changes of chronic venous insufficiency with lymphedema in the left leg. General Notes: Wound exam; the area in question is on the left lateral foot. Small punched out wound. The surface is still not viable but it debrides easily with a #3 curette. I am able to get the viable tissue this week which is a big improvement. Integumentary (Hair, Skin) Wound #1 status is Open. Original cause of wound was Gradually Appeared. The wound is located on the Left,Lateral Foot. The wound measures 1.8cm length x 0.8cm width x 0.3cm depth; 1.131cm^2 area and 0.339cm^3 volume. There is Fat Layer (Subcutaneous Tissue) Exposed exposed. There is no tunneling or undermining noted. There is a medium amount of serosanguineous drainage noted. The wound margin is flat and intact. There is small (1-33%) red granulation within the wound bed. There is a large (67-100%) amount of necrotic tissue within the wound bed including Eschar and Adherent Slough. Assessment Active Problems ICD-10 Type 2 diabetes mellitus with foot ulcer Chronic venous hypertension (idiopathic) with inflammation of bilateral lower extremity Non-pressure chronic ulcer of other part of left foot with other specified severity Rheumatoid arthritis with rheumatoid factor of  other specified site without organ or systems involvement Procedures Wound #1 Pre-procedure diagnosis of Wound #1 is a Diabetic Wound/Ulcer of the Lower Extremity located on the Left,Lateral  Foot .Severity of Tissue Pre Debridement is: Fat layer exposed. There was a Excisional Skin/Subcutaneous Tissue Debridement with a total area of 1.44 sq cm performed by Ricard Dillon, MD. With the following instrument(s): Curette to remove Viable and Non-Viable tissue/material. Material removed includes Subcutaneous Tissue and Slough and after achieving pain control using Lidocaine. No specimens were taken. A time out was conducted at 12:55, prior to the start of the procedure. A Minimum amount of bleeding was controlled with Pressure. The procedure was tolerated well. Post Debridement Measurements: 1.8cm length x 0.8cm width x 0.3cm depth; 0.339cm^3 volume. Character of Wound/Ulcer Post Debridement is stable. Severity of Tissue Post Debridement is: Fat layer exposed. Post procedure Diagnosis Wound #1: Same as Pre-Procedure Nina Perez, Nina Perez. ([id number]) Plan Wound Cleansing: Wound #1 Left,Lateral Foot: Clean wound with Normal Saline. May Shower, gently pat wound dry prior to applying new dressing. Anesthetic (add to Medication List): Wound #1 Left,Lateral Foot: Topical Lidocaine 4% cream applied to wound bed prior to debridement (In Clinic Only). Primary Wound Dressing: Wound #1 Left,Lateral Foot: Santyl Ointment Secondary Dressing: Wound #1 Left,Lateral Foot: ABD and Kerlix/Conform Dressing Change Frequency: Wound #1 Left,Lateral Foot: Change dressing every day. Follow-up Appointments: Wound #1 Left,Lateral Foot: Return Appointment in 1 week. Edema Control: Wound #1 Left,Lateral Foot: Patient to wear own compression stockings Elevate legs to the level of the heart and pump ankles as often as possible Off-Loading: Wound #1 Left,Lateral Foot: Other: - keep pressure off of wound  area Additional Orders / Instructions: Wound #1 Left,Lateral Foot: Activity as tolerated Medications-please add to medication list.: Wound #1 Left,Lateral Foot: Santyl Enzymatic Ointment Services and Therapies ordered were: Arterial Studies- Bilateral - Open wound of left foot. 1. I am continue with the Santyl. We definitely have a better looking surface and I finally got the viable tissue #2 look back through care everywhere I do not see any evidence this patient has had a vascular review. She did see vein and vascular at Dr. Bunnie Domino office in 2018 but this seems specifically limited to venous issues 3. We will order formal arterial studies including ABIs, TBI's and arterial Dopplers 4. The patient has rheumatoid arthritis on methotrexate. With regards to methotrexate inhibiting wound healing we may need to reach out to Dr. Jefm Bryant who is her rheumatologist about holding this for a period of time. She is not on prednisone. I will consider this after the arterial studies 5. The surface of her wound is better hopefully can change to a collagen-based dressing next week consider Grafix PL Electronic Signature(s) Signed: 03/23/2019 5:41:49 PM By: Linton Ham MD Nina Perez ([id number]) Entered By: Linton Ham on 03/23/2019 13:26:36 Nina Perez ([id number]) -------------------------------------------------------------------------------- SuperBill Details Patient Name: Nina Perez Date of Service: 03/23/2019 Medical Record Number: [id number] Patient Account Number: [id number] Date of Birth/Sex: 03-06-1941 (78 y.o. F) Treating RN: Cornell Barman Primary Care Provider: Einar Pheasant Other Clinician: Referring Provider: Einar Pheasant Treating Provider/Extender: Tito Dine in Treatment: 3 Diagnosis Coding ICD-10 Codes Code Description E11.621 Type 2 diabetes mellitus with foot ulcer I87.323 Chronic venous hypertension (idiopathic) with inflammation of bilateral  lower extremity L97.528 Non-pressure chronic ulcer of other part of left foot with other specified severity M05.7A Rheumatoid arthritis with rheumatoid factor of other specified site without organ or systems involvement Facility Procedures CPT4 Code Description: [id number] - DEB SUBQ TISSUE 20 SQ CM/< ICD-10 Diagnosis Description L97.528 Non-pressure chronic ulcer of other part of left foot with other E11.621 Type 2 diabetes mellitus  with foot ulcer Modifier: specified sever Quantity: 1 ity Physician Procedures CPT4 Code Description: [id number] - WC PHYS SUBQ TISS 20 SQ CM ICD-10 Diagnosis Description L97.528 Non-pressure chronic ulcer of other part of left foot with other E11.621 Type 2 diabetes mellitus with foot ulcer Modifier: specified severi Quantity: 1 ty Electronic Signature(s) Signed: 03/23/2019 5:41:49 PM By: Linton Ham MD Entered By: Linton Ham on 03/23/2019 13:26:54

## 2019-03-24 NOTE — Progress Notes (Signed)
Fat Layer (Subcutaneous N/A N/A Tissue) Exposed: Yes Fascia: No Tendon: No Nina Perez, Nina Perez ([id number]) Muscle: No Joint: No Bone: No Epithelialization: None N/A N/A Debridement: Debridement - Excisional N/A N/A Pre-procedure 12:55 N/A N/A Verification/Time Out Taken: Pain Control: Lidocaine N/A N/A Tissue Debrided: Subcutaneous, Slough N/A N/A Level: Skin/Subcutaneous Tissue N/A N/A Debridement Area (sq cm): 1.44 N/A N/A Instrument: Curette N/A N/A Bleeding: Minimum N/A N/A Hemostasis Achieved: Pressure N/A N/A Debridement Treatment Procedure was tolerated well N/A N/A Response: Post Debridement 1.8x0.8x0.3 N/A N/A Measurements L x W x D (cm) Post Debridement Volume: 0.339 N/A N/A (cm) Procedures Performed: Debridement N/A N/A Treatment Notes Wound #1 (Left, Lateral Foot) Notes santyl, abd, conform Electronic Signature(s) Signed: 03/23/2019 5:41:49 PM By: Linton Ham MD Entered By: Linton Ham on 03/23/2019 13:20:56 Nina Perez ([id number]) -------------------------------------------------------------------------------- Yeager Details Patient Name: Nina Perez Date of Service: 03/23/2019 12:45 PM Medical Record Number: [id number] Patient Account Number: [id number] Date  of Birth/Sex: 01-27-1941 (78 y.o. F) Treating RN: Cornell Barman Primary Care Malka Bocek: Einar Pheasant Other Clinician: Referring Katriona Schmierer: Einar Pheasant Treating Abhijot Straughter/Extender: Tito Dine in Treatment: 3 Active Inactive Abuse / Safety / Falls / Self Care Management Nursing Diagnoses: Impaired physical mobility Potential for falls Goals: Patient will remain injury free related to falls Date Initiated: 03/02/2019 Target Resolution Date: 03/16/2019 Goal Status: Active Interventions: Assess fall risk on admission and as needed Notes: Medication Nursing Diagnoses: Knowledge deficit related to medication safety: actual or potential Goals: Patient/caregiver will demonstrate understanding of all current medications Date Initiated: 03/02/2019 Target Resolution Date: 03/17/2019 Goal Status: Active Interventions: Assess for medication contraindications each visit where new medications are prescribed Notes: Necrotic Tissue Nursing Diagnoses: Impaired tissue integrity related to necrotic/devitalized tissue Goals: Necrotic/devitalized tissue will be minimized in the wound bed Date Initiated: 03/02/2019 Target Resolution Date: 03/17/2019 Goal Status: Active Interventions: Nina Perez, Nina Perez ([id number]) Assess patient pain level pre-, during and post procedure and prior to discharge Treatment Activities: Apply topical anesthetic as ordered : 03/02/2019 Notes: Orientation to the Wound Care Program Nursing Diagnoses: Knowledge deficit related to the wound healing center program Goals: Patient/caregiver will verbalize understanding of the [facility] Date Initiated: 03/02/2019 Target Resolution Date: 03/17/2019 Goal Status: Active Interventions: Provide education on orientation to the wound center Notes: Venous Leg Ulcer Nursing Diagnoses: Actual venous Insuffiency (use after diagnosis is confirmed) Goals: Patient/caregiver will verbalize understanding  of disease process and disease management Date Initiated: 03/02/2019 Target Resolution Date: 03/17/2019 Goal Status: Active Verify adequate tissue perfusion prior to therapeutic compression application Date Initiated: 03/02/2019 Target Resolution Date: 03/17/2019 Goal Status: Active Interventions: Assess peripheral edema status every visit. Provide education on venous insufficiency Notes: Wound/Skin Impairment Nursing Diagnoses: Impaired tissue integrity Goals: Ulcer/skin breakdown will have a volume reduction of 30% by week 4 Date Initiated: 03/02/2019 Target Resolution Date: 04/02/2019 Goal Status: Active Interventions: Assess ulceration(s) every visit Nina Perez, Nina Perez ([id number]) Treatment Activities: Topical wound management initiated : 03/02/2019 Notes: Electronic Signature(s) Signed: 03/23/2019 5:21:25 PM By: Gretta Cool, BSN, RN, CWS, Kim RN, BSN Entered By: Gretta Cool, BSN, RN, CWS, Kim on 03/23/2019 12:55:26 Nina Perez ([id number]) -------------------------------------------------------------------------------- Pain Assessment Details Patient Name: Nina Perez Date of Service: 03/23/2019 12:45 PM Medical Record Number: [id number] Patient Account Number: [id number] Date of Birth/Sex: 30-Apr-1941 (78 y.o. F) Treating RN: Harold Barban Primary Care Melodi Happel: Einar Pheasant Other Clinician: Referring Mystic Labo: Einar Pheasant Treating Keldon Lassen/Extender: Tito Dine in Treatment: 3 Active Problems Location of Pain Severity and Description  of Pain Patient Has Paino Patient Unable to Respond Site Locations Rate the pain. Current Pain Level: 3 Pain Management and Medication Current Pain Management: Electronic Signature(s) Signed: 03/23/2019 4:36:31 PM By: Harold Barban Entered By: Harold Barban on 03/23/2019 12:41:59 Nina Perez ([id number]) -------------------------------------------------------------------------------- Patient/Caregiver Education  Details Patient Name: Nina Perez Date of Service: 03/23/2019 12:45 PM Medical Record Number: [id number] Patient Account Number: [id number] Date of Birth/Gender: 1940-10-19 (78 y.o. F) Treating RN: Cornell Barman Primary Care Physician: Einar Pheasant Other Clinician: Referring Physician: Einar Pheasant Treating Physician/Extender: Tito Dine in Treatment: 3 Education Assessment Education Provided To: Patient Education Topics Provided Welcome To [facility]: Handouts: Welcome To [facility] Methods: Demonstration, Explain/Verbal Responses: State content correctly Wound Debridement: Handouts: Wound Debridement Methods: Explain/Verbal Responses: State content correctly Electronic Signature(s) Signed: 03/23/2019 5:21:25 PM By: Gretta Cool, BSN, RN, CWS, Kim RN, BSN Entered By: Gretta Cool, BSN, RN, CWS, Kim on 03/23/2019 12:59:04 Nina Perez ([id number]) -------------------------------------------------------------------------------- Wound Assessment Details Patient Name: Nina Perez Date of Service: 03/23/2019 12:45 PM Medical Record Number: [id number] Patient Account Number: [id number] Date of Birth/Sex: 07/16/1940 (78 y.o. F) Treating RN: Harold Barban Primary Care Maribel Luis: Einar Pheasant Other Clinician: Referring Ephrata Verville: Einar Pheasant Treating Sherrine Salberg/Extender: Tito Dine in Treatment: 3 Wound Status Wound Number: 1 Primary Diabetic Wound/Ulcer of the Lower Extremity Etiology: Wound Location: Left Foot - Lateral Wound Open Wounding Event: Gradually Appeared Status: Date Acquired: 01/17/2019 Comorbid Hypertension, Type II Diabetes, Scleroderma, Weeks Of Treatment: 3 History: Gout, Rheumatoid Arthritis Clustered Wound: No Photos Wound Measurements Length: (cm) 1.8 Width: (cm) 0.8 Depth: (cm) 0.3 Area: (cm) 1.131 Volume: (cm) 0.339 % Reduction in Area: -14.2% % Reduction in Volume: -71.2% Epithelialization:  None Tunneling: No Undermining: No Wound Description Classification: Grade 1 Foul Odor Wound Margin: Flat and Intact Slough/Fi Exudate Amount: Medium Exudate Type: Serosanguineous Exudate Color: red, brown After Cleansing: No brino Yes Wound Bed Granulation Amount: Small (1-33%) Exposed Structure Granulation Quality: Red Fascia Exposed: No Necrotic Amount: Large (67-100%) Fat Layer (Subcutaneous Tissue) Exposed: Yes Necrotic Quality: Eschar, Adherent Slough Tendon Exposed: No Muscle Exposed: No Joint Exposed: No Bone Exposed: No Treatment Notes Nina Perez, Nina Perez ([id number]) Wound #1 (Left, Lateral Foot) Notes santyl, abd, conform Electronic Signature(s) Signed: 03/23/2019 4:36:31 PM By: Harold Barban Entered By: Harold Barban on 03/23/2019 12:48:28 Nina Perez ([id number]) -------------------------------------------------------------------------------- Vitals Details Patient Name: Nina Perez Date of Service: 03/23/2019 12:45 PM Medical Record Number: [id number] Patient Account Number: [id number] Date of Birth/Sex: August 02, 1940 (78 y.o. F) Treating RN: Harold Barban Primary Care Kiley Torrence: Einar Pheasant Other Clinician: Referring Magally Vahle: Einar Pheasant Treating Roslind Michaux/Extender: Tito Dine in Treatment: 3 Vital Signs Time Taken: 12:35 Temperature (F): 98.5 Height (in): 66 Pulse (bpm): 70 Weight (lbs): 230 Respiratory Rate (breaths/min): 18 Body Mass Index (BMI): 37.1 Blood Pressure (mmHg): 162/69 Reference Range: 80 - 120 mg / dl Electronic Signature(s) Signed: 03/23/2019 4:36:31 PM By: Harold Barban Entered By: Harold Barban on 03/23/2019 12:47:29  of Pain Patient Has Paino Patient Unable to Respond Site Locations Rate the pain. Current Pain Level: 3 Pain Management and Medication Current Pain Management: Electronic Signature(s) Signed: 03/23/2019 4:36:31 PM By: Harold Barban Entered By: Harold Barban on 03/23/2019 12:41:59 Nina Perez ([id number]) -------------------------------------------------------------------------------- Patient/Caregiver Education  Details Patient Name: Nina Perez Date of Service: 03/23/2019 12:45 PM Medical Record Number: [id number] Patient Account Number: [id number] Date of Birth/Gender: 1940-10-19 (78 y.o. F) Treating RN: Cornell Barman Primary Care Physician: Einar Pheasant Other Clinician: Referring Physician: Einar Pheasant Treating Physician/Extender: Tito Dine in Treatment: 3 Education Assessment Education Provided To: Patient Education Topics Provided Welcome To [facility]: Handouts: Welcome [facility] Methods: Demonstration, Explain/Verbal Responses: State content correctly Wound Debridement: Handouts: Wound Debridement Methods: Explain/Verbal Responses: State content correctly Electronic Signature(s) Signed: 03/23/2019 5:21:25 PM By: Gretta Cool, BSN, RN, CWS, Kim RN, BSN Entered By: Gretta Cool, BSN, RN, CWS, Kim on 03/23/2019 12:59:04 Nina Perez ([id number]) -------------------------------------------------------------------------------- Wound Assessment Details Patient Name: Nina Perez Date of Service: 03/23/2019 12:45 PM Medical Record Number: [id number] Patient Account Number: [id number] Date of Birth/Sex: 07/16/1940 (78 y.o. F) Treating RN: Harold Barban Primary Care Maribel Luis: Einar Pheasant Other Clinician: Referring Ephrata Verville: Einar Pheasant Treating Sherrine Salberg/Extender: Tito Dine in Treatment: 3 Wound Status Wound Number: 1 Primary Diabetic Wound/Ulcer of the Lower Extremity Etiology: Wound Location: Left Foot - Lateral Wound Open Wounding Event: Gradually Appeared Status: Date Acquired: 01/17/2019 Comorbid Hypertension, Type II Diabetes, Scleroderma, Weeks Of Treatment: 3 History: Gout, Rheumatoid Arthritis Clustered Wound: No Photos Wound Measurements Length: (cm) 1.8 Width: (cm) 0.8 Depth: (cm) 0.3 Area: (cm) 1.131 Volume: (cm) 0.339 % Reduction in Area: -14.2% % Reduction in Volume: -71.2% Epithelialization:  None Tunneling: No Undermining: No Wound Description Classification: Grade 1 Foul Odor Wound Margin: Flat and Intact Slough/Fi Exudate Amount: Medium Exudate Type: Serosanguineous Exudate Color: red, brown After Cleansing: No brino Yes Wound Bed Granulation Amount: Small (1-33%) Exposed Structure Granulation Quality: Red Fascia Exposed: No Necrotic Amount: Large (67-100%) Fat Layer (Subcutaneous Tissue) Exposed: Yes Necrotic Quality: Eschar, Adherent Slough Tendon Exposed: No Muscle Exposed: No Joint Exposed: No Bone Exposed: No Treatment Notes Nina Perez, Nina Perez ([id number]) Wound #1 (Left, Lateral Foot) Notes santyl, abd, conform Electronic Signature(s) Signed: 03/23/2019 4:36:31 PM By: Harold Barban Entered By: Harold Barban on 03/23/2019 12:48:28 Nina Perez ([id number]) -------------------------------------------------------------------------------- Vitals Details Patient Name: Nina Perez Date of Service: 03/23/2019 12:45 PM Medical Record Number: [id number] Patient Account Number: [id number] Date of Birth/Sex: August 02, 1940 (78 y.o. F) Treating RN: Harold Barban Primary Care Kiley Torrence: Einar Pheasant Other Clinician: Referring Magally Vahle: Einar Pheasant Treating Roslind Michaux/Extender: Tito Dine in Treatment: 3 Vital Signs Time Taken: 12:35 Temperature (F): 98.5 Height (in): 66 Pulse (bpm): 70 Weight (lbs): 230 Respiratory Rate (breaths/min): 18 Body Mass Index (BMI): 37.1 Blood Pressure (mmHg): 162/69 Reference Range: 80 - 120 mg / dl Electronic Signature(s) Signed: 03/23/2019 4:36:31 PM By: Harold Barban Entered By: Harold Barban on 03/23/2019 12:47:29

## 2019-03-27 ENCOUNTER — Encounter: Payer: Self-pay | Admitting: Internal Medicine

## 2019-03-27 DIAGNOSIS — S91302A Unspecified open wound, left foot, initial encounter: Secondary | ICD-10-CM | POA: Insufficient documentation

## 2019-03-27 NOTE — Assessment & Plan Note (Signed)
Blood pressure under good control.  Continue same medication regimen.  Follow pressures.  Follow metabolic panel.   

## 2019-03-27 NOTE — Assessment & Plan Note (Signed)
Controlled.  

## 2019-03-27 NOTE — Assessment & Plan Note (Signed)
Wearing compression hose.  Has been followed by vascular surgery.

## 2019-03-27 NOTE — Assessment & Plan Note (Signed)
Low carb diet and exercise.  Follow met b and a1c.   

## 2019-03-27 NOTE — Assessment & Plan Note (Signed)
Followed by Dr Kernodle.  On MTX.   

## 2019-03-27 NOTE — Assessment & Plan Note (Signed)
Persistent wound - left foot.  Increased pain.  Limiting her sleep, etc.  Seeing wound clinic.  Taking tylenol.  Needs something for pain.  Discussed treatment options.  She is unable to take antiinflammatories.  Continue tylenol.  Tramadol.  Checked dataase.  Follow.

## 2019-03-27 NOTE — Assessment & Plan Note (Signed)
Low cholesterol diet and exercise.  Discussed starting statin medication.  Discussed calculated cholesterol risk.  Will hold until can get wound controlled.  Follow.

## 2019-03-27 NOTE — Assessment & Plan Note (Signed)
Followed by GI.  No symptoms now.

## 2019-03-27 NOTE — Assessment & Plan Note (Signed)
Follow cbc.  

## 2019-03-28 ENCOUNTER — Other Ambulatory Visit: Payer: Self-pay | Admitting: Internal Medicine

## 2019-03-29 ENCOUNTER — Telehealth: Payer: Self-pay

## 2019-03-29 ENCOUNTER — Other Ambulatory Visit (HOSPITAL_COMMUNITY): Payer: Self-pay | Admitting: Internal Medicine

## 2019-03-29 DIAGNOSIS — L97529 Non-pressure chronic ulcer of other part of left foot with unspecified severity: Secondary | ICD-10-CM

## 2019-03-29 NOTE — Telephone Encounter (Signed)
JUST REFERRAL ON FILE, SENT REFERRAL TO SCHEDULING

## 2019-03-30 ENCOUNTER — Other Ambulatory Visit: Payer: Self-pay

## 2019-03-30 ENCOUNTER — Ambulatory Visit (HOSPITAL_COMMUNITY)
Admission: RE | Admit: 2019-03-30 | Discharge: 2019-03-30 | Disposition: A | Discharge status: Home / Self Care | Payer: Medicare Other | Source: Ambulatory Visit | Attending: Cardiology | Admitting: Cardiology

## 2019-03-30 ENCOUNTER — Encounter: Payer: Medicare Other | Admitting: Internal Medicine

## 2019-03-30 DIAGNOSIS — E11621 Type 2 diabetes mellitus with foot ulcer: Secondary | ICD-10-CM | POA: Diagnosis not present

## 2019-03-30 DIAGNOSIS — L97528 Non-pressure chronic ulcer of other part of left foot with other specified severity: Secondary | ICD-10-CM

## 2019-03-30 DIAGNOSIS — L97529 Non-pressure chronic ulcer of other part of left foot with unspecified severity: Secondary | ICD-10-CM | POA: Insufficient documentation

## 2019-03-31 NOTE — Progress Notes (Signed)
ALISHAH, LOGEMANN ([id number]) Visit Report for 03/30/2019 Arrival Information Details Patient Name: Nina Perez, Nina Perez Date of Service: 03/30/2019 12:45 PM Medical Record Number: [id number] Patient Account Number: [id number] Date of Birth/Sex: Aug 18, 1940 (78 y.o. F) Treating RN: Huel Coventry Primary Care Daeja Helderman: Dale [place] Other Clinician: Referring Tiea Manninen: Dale [place] Treating Jamella Grayer/Extender: Altamese [place] in Treatment: 4 Visit Information History Since Last Visit Added or deleted any medications: No Patient Arrived: Wheel Chair Any new allergies or adverse reactions: No Arrival Time: 12:48 Had a fall or experienced change in No Accompanied By: husband activities of daily living that may affect Transfer Assistance: None risk of falls: Patient Identification Verified: Yes Signs or symptoms of abuse/neglect since last visito No Secondary Verification Process Completed: Yes Hospitalized since last visit: No Implantable device outside of the clinic excluding No cellular tissue based products placed in the center since last visit: Has Dressing in Place as Prescribed: Yes Pain Present Now: No Electronic Signature(s) Signed: 03/30/2019 4:28:59 PM By: Dayton Martes RCP, RRT, CHT Entered By: Dayton Martes on 03/30/2019 12:49:04 Nina Perez ([id number]) -------------------------------------------------------------------------------- Encounter Discharge Information Details Patient Name: Nina Perez Date of Service: 03/30/2019 12:45 PM Medical Record Number: [id number] Patient Account Number: [id number] Date of Birth/Sex: [birth year] (78 y.o. F) Treating RN: Huel Coventry Primary Care Quinterious Walraven: Dale Cutler Other Clinician: Referring Viktoriya Glaspy: Dale [place] Treating Noelene Gang/Extender: Altamese [place] in Treatment: 4 Encounter Discharge Information Items Post Procedure Vitals Discharge Condition: Stable Temperature (F):  98.5 Ambulatory Status: Wheelchair Pulse (bpm): 67 Discharge Destination: Home Respiratory Rate (breaths/min): 16 Transportation: Private Auto Blood Pressure (mmHg): 167/72 Accompanied By: self Schedule Follow-up Appointment: No Clinical Summary of Care: Electronic Signature(s) Signed: 03/31/2019 1:15:06 PM By: Elliot Gurney, BSN, RN, CWS, Kim RN, BSN Entered By: Elliot Gurney, BSN, RN, CWS, Kim on 03/30/2019 13:20:56 Nina Perez ([id number]) -------------------------------------------------------------------------------- Lower Extremity Assessment Details Patient Name: Nina Perez Date of Service: 03/30/2019 12:45 PM Medical Record Number: [id number] Patient Account Number: [id number] Date of Birth/Sex: 1940-05-19 (78 y.o. F) Treating RN: Curtis Sites Primary Care Zaylei Mullane: Dale [place] Other Clinician: Referring Aimar [place]: Dale [place] Treating Daelyn Mozer/Extender: Maxwell Caul [place] in Treatment: 4 Edema Assessment Assessed: [Left: No] [Right: No] Edema: [Left: Ye] [Right: s] Vascular Assessment Pulses: Dorsalis Pedis Palpable: [Left:Yes] Electronic Signature(s) Signed: 03/30/2019 4:46:03 PM By: Curtis Sites Entered By: Curtis Sites on 03/30/2019 12:54:47 Nina Perez ([id number]) -------------------------------------------------------------------------------- Multi Wound Chart Details Patient Name: Nina Perez Date of Service: 03/30/2019 12:45 PM Medical Record Number: [id number] Patient Account Number: [id number] Date of Birth/Sex: 1940-12-27 (78 y.o. F) Treating RN: Huel Coventry Primary Care Jessa Stinson: Dale [place] Other Clinician: Referring Keon Benscoter: Dale [place] Treating Isaih Bulger/Extender: Altamese [place] in Treatment: 4 Vital Signs Height(in): 66 Pulse(bpm): 67 Weight(lbs): 230 Blood Pressure(mmHg): 167/72 Body Mass Index(BMI): 37 Temperature(F): 98.5 Respiratory Rate 16 (breaths/min): Photos: [N/A:N/A] Wound Location: Left Foot -  Lateral N/A N/A Wounding Event: Gradually Appeared N/A N/A Primary Etiology: Diabetic Wound/Ulcer of the N/A N/A Lower Extremity Comorbid History: Hypertension, Type II N/A N/A Diabetes, Scleroderma, Gout, Rheumatoid Arthritis Date Acquired: 01/17/2019 N/A N/A Weeks of Treatment: 4 N/A N/A Wound Status: Open N/A N/A Measurements L x W x D 1.7x0.8x0.5 N/A N/A (cm) Area (cm) : 1.068 N/A N/A Volume (cm) : 0.534 N/A N/A % Reduction in Area: -7.90% N/A N/A % Reduction in Volume: -169.70% N/A N/A Classification: Grade 1 N/A N/A Exudate Amount: Medium N/A N/A Exudate Type: Serosanguineous N/A N/A Exudate Color: red, brown N/A N/A Wound  Margin: Flat and Intact N/A N/A Granulation Amount: Small (1-33%) N/A N/A Granulation Quality: Red N/A N/A Necrotic Amount: Large (67-100%) N/A N/A Necrotic Tissue: Eschar, Adherent Slough N/A N/A Exposed Structures: Fat Layer (Subcutaneous N/A N/A Tissue) Exposed: Yes Fascia: No Tendon: No Nina Perez, Nina T. ([id number]) Muscle: No Joint: No Bone: No Epithelialization: None N/A N/A Debridement: Debridement - Selective/Open N/A N/A Wound Pre-procedure 13:15 N/A N/A Verification/Time Out Taken: Pain Control: Lidocaine N/A N/A Tissue Debrided: Slough N/A N/A Level: Non-Viable Tissue N/A N/A Debridement Area (sq cm): 1.36 N/A N/A Instrument: Curette N/A N/A Bleeding: Moderate N/A N/A Hemostasis Achieved: Pressure N/A N/A Debridement Treatment Procedure was tolerated well N/A N/A Response: Post Debridement 1.7x0.8x0.5 N/A N/A Measurements L x W x D (cm) Post Debridement Volume: 0.534 N/A N/A (cm) Procedures Performed: Debridement N/A N/A Treatment Notes Wound #1 (Left, Lateral Foot) Notes Prisma Ag, abd, conform Electronic Signature(s) Signed: 03/30/2019 5:09:09 PM By: Baltazar Najjar MD Entered By: Baltazar Najjar on 03/30/2019 13:31:17 Nina Perez  ([id number]) -------------------------------------------------------------------------------- Multi-Disciplinary Care Plan Details Patient Name: Nina Perez Date of Service: 03/30/2019 12:45 PM Medical Record Number: [id number] Patient Account Number: [id number] Date of Birth/Sex: 13-Feb-1941 (78 y.o. F) Treating RN: Huel Coventry Primary Care Milika Ventress: Dale [place] Other Clinician: Referring Takeisha Cianci: Dale [place] Treating Danna Sewell/Extender: Altamese [place] in Treatment: 4 Active Inactive Abuse / Safety / Falls / Self Care Management Nursing Diagnoses: Impaired physical mobility Potential for falls Goals: Patient will remain injury free related to falls Date Initiated: 03/02/2019 Target Resolution Date: 03/16/2019 Goal Status: Active Interventions: Assess fall risk on admission and as needed Notes: Medication Nursing Diagnoses: Knowledge deficit related to medication safety: actual or potential Goals: Patient/caregiver will demonstrate understanding of all current medications Date Initiated: 03/02/2019 Target Resolution Date: 03/17/2019 Goal Status: Active Interventions: Assess for medication contraindications each visit where new medications are prescribed Notes: Necrotic Tissue Nursing Diagnoses: Impaired tissue integrity related to necrotic/devitalized tissue Goals: Necrotic/devitalized tissue will be minimized in the wound bed Date Initiated: 03/02/2019 Target Resolution Date: 03/17/2019 Goal Status: Active Interventions: TOMASITA, TABIN ([id number]) Assess patient pain level pre-, during and post procedure and prior to discharge Treatment Activities: Apply topical anesthetic as ordered : 03/02/2019 Notes: Orientation to the Wound Care Program Nursing Diagnoses: Knowledge deficit related to the wound healing center program Goals: Patient/caregiver will verbalize understanding of the Wound Healing Center Program Date Initiated: 03/02/2019 Target  Resolution Date: 03/17/2019 Goal Status: Active Interventions: Provide education on orientation to the wound center Notes: Venous Leg Ulcer Nursing Diagnoses: Actual venous Insuffiency (use after diagnosis is confirmed) Goals: Patient/caregiver will verbalize understanding of disease process and disease management Date Initiated: 03/02/2019 Target Resolution Date: 03/17/2019 Goal Status: Active Verify adequate tissue perfusion prior to therapeutic compression application Date Initiated: 03/02/2019 Target Resolution Date: 03/17/2019 Goal Status: Active Interventions: Assess peripheral edema status every visit. Provide education on venous insufficiency Notes: Wound/Skin Impairment Nursing Diagnoses: Impaired tissue integrity Goals: Ulcer/skin breakdown will have a volume reduction of 30% by week 4 Date Initiated: 03/02/2019 Target Resolution Date: 04/02/2019 Goal Status: Active Interventions: Assess ulceration(s) every visit Nina Perez, Nina Perez ([id number]) Treatment Activities: Topical wound management initiated : 03/02/2019 Notes: Electronic Signature(s) Signed: 03/31/2019 1:15:06 PM By: Elliot Gurney, BSN, RN, CWS, Kim RN, BSN Entered By: Elliot Gurney, BSN, RN, CWS, Kim on 03/30/2019 13:16:50 Nina Perez ([id number]) -------------------------------------------------------------------------------- Pain Assessment Details Patient Name: Nina Perez Date of Service: 03/30/2019 12:45 PM Medical Record Number: [id number] Patient Account Number: [id number] Date of Birth/Sex: [birth year] (78 y.o.  F) Treating RN: Huel Coventry Primary Care Stephano Arrants: Dale [place] Other Clinician: Referring Takahiro Godinho: Dale [place] Treating Sherae Santino/Extender: Altamese [place] in Treatment: 4 Active Problems Location of Pain Severity and Description of Pain Patient Has Paino No Site Locations Pain Management and Medication Current Pain Management: Electronic Signature(s) Signed: 03/30/2019 4:28:59 PM  By: Dayton Martes RCP, RRT, CHT Signed: 03/31/2019 1:15:06 PM By: Elliot Gurney, BSN, RN, CWS, Kim RN, BSN Entered By: Dayton Martes on 03/30/2019 12:49:12 Nina Perez ([id number]) -------------------------------------------------------------------------------- Patient/Caregiver Education Details Patient Name: Nina Perez Date of Service: 03/30/2019 12:45 PM Medical Record Number: [id number] Patient Account Number: [id number] Date of Birth/Gender: 1940-05-18 (78 y.o. F) Treating RN: Huel Coventry Primary Care Physician: Dale [place] Other Clinician: Referring Physician: Dale [place] Treating Physician/Extender: Altamese [place] in Treatment: 4 Education Assessment Education Provided To: Patient Education Topics Provided Venous: Wound/Skin Impairment: Handouts: Caring for Your Ulcer Methods: Demonstration, Explain/Verbal Responses: State content correctly Electronic Signature(s) Signed: 03/31/2019 1:15:06 PM By: Elliot Gurney, BSN, RN, CWS, Kim RN, BSN Entered By: Elliot Gurney, BSN, RN, CWS, Kim on 03/30/2019 13:19:39 Nina Perez ([id number]) -------------------------------------------------------------------------------- Wound Assessment Details Patient Name: Nina Perez Date of Service: 03/30/2019 12:45 PM Medical Record Number: [id number] Patient Account Number: [id number] Date of Birth/Sex: 01-27-1941 (78 y.o. F) Treating RN: Curtis Sites Primary Care Ellias Mcelreath: Dale [place] Other Clinician: Referring Lyndzie Zentz: Dale [place] Treating Shantal Roan/Extender: Altamese [place] in Treatment: 4 Wound Status Wound Number: 1 Primary Diabetic Wound/Ulcer of the Lower Extremity Etiology: Wound Location: Left Foot - Lateral Wound Open Wounding Event: Gradually Appeared Status: Date Acquired: 01/17/2019 Comorbid Hypertension, Type II Diabetes, Scleroderma, Weeks Of Treatment: 4 History: Gout, Rheumatoid Arthritis Clustered Wound:  No Photos Wound Measurements Length: (cm) 1.7 Width: (cm) 0.8 Depth: (cm) 0.5 Area: (cm) 1.068 Volume: (cm) 0.534 % Reduction in Area: -7.9% % Reduction in Volume: -169.7% Epithelialization: None Tunneling: No Undermining: No Wound Description Classification: Grade 1 Foul Odor Wound Margin: Flat and Intact Slough/Fi Exudate Amount: Medium Exudate Type: Serosanguineous Exudate Color: red, brown After Cleansing: No brino Yes Wound Bed Granulation Amount: Small (1-33%) Exposed Structure Granulation Quality: Red Fascia Exposed: No Necrotic Amount: Large (67-100%) Fat Layer (Subcutaneous Tissue) Exposed: Yes Necrotic Quality: Eschar, Adherent Slough Tendon Exposed: No Muscle Exposed: No Joint Exposed: No Bone Exposed: No Treatment Notes ATHALEEN, GALLEN ([id number]) Wound #1 (Left, Lateral Foot) Notes Prisma Ag, abd, conform Electronic Signature(s) Signed: 03/30/2019 4:46:03 PM By: Curtis Sites Entered By: Curtis Sites on 03/30/2019 12:54:01 Nina Perez ([id number]) -------------------------------------------------------------------------------- Vitals Details Patient Name: Nina Perez Date of Service: 03/30/2019 12:45 PM Medical Record Number: [id number] Patient Account Number: [id number] Date of Birth/Sex: [birth year] (78 y.o. F) Treating RN: Huel Coventry Primary Care Yaritza Leist: Dale [place] Other Clinician: Referring Randen Kauth: Dale [place] Treating Carmelia Tiner/Extender: Altamese [place] in Treatment: 4 Vital Signs Time Taken: 12:48 Temperature (F): 98.5 Height (in): 66 Pulse (bpm): 67 Weight (lbs): 230 Respiratory Rate (breaths/min): 16 Body Mass Index (BMI): 37.1 Blood Pressure (mmHg): 167/72 Reference Range: 80 - 120 mg / dl Electronic Signature(s) Signed: 03/30/2019 4:28:59 PM By: Dayton Martes RCP, RRT, CHT Entered By: Dayton Martes on 03/30/2019 12:50:32

## 2019-03-31 NOTE — Progress Notes (Signed)
Nina Perez, Nina Perez ([id number]) Visit Report for 03/30/2019 Debridement Details Patient Name: Nina Perez, Nina Perez Date of Service: 03/30/2019 12:45 PM Medical Record Number: [id number] Patient Account Number: [id number] Date of Birth/Sex: [birth year] (78 y.o. F) Treating RN: Cornell Barman Primary Care Provider: Einar Pheasant Other Clinician: Referring Provider: Einar Pheasant Treating Provider/Extender: Tito Dine in Treatment: 4 Debridement Performed for Wound #1 Left,Lateral Foot Assessment: Performed By: Physician Ricard Dillon, MD Debridement Type: Debridement Severity of Tissue Pre Fat layer exposed Debridement: Level of Consciousness (Pre- Awake and Alert procedure): Pre-procedure Verification/Time Yes - 13:15 Out Taken: Start Time: 13:15 Pain Control: Lidocaine Total Area Debrided (L x W): 1.7 (cm) x 0.8 (cm) = 1.36 (cm) Tissue and other material Viable, Non-Viable, Slough, Slough debrided: Level: Non-Viable Tissue Debridement Description: Selective/Open Wound Instrument: Curette Bleeding: Moderate Hemostasis Achieved: Pressure End Time: 13:17 Response to Treatment: Procedure was tolerated well Level of Consciousness Awake and Alert (Post-procedure): Post Debridement Measurements of Total Wound Length: (cm) 1.7 Width: (cm) 0.8 Depth: (cm) 0.5 Volume: (cm) 0.534 Character of Wound/Ulcer Post Debridement: Stable Severity of Tissue Post Debridement: Fat layer exposed Post Procedure Diagnosis Same as Pre-procedure Electronic Signature(s) Signed: 03/30/2019 5:09:09 PM By: Linton Ham MD Signed: 03/31/2019 1:15:06 PM By: Gretta Cool, BSN, RN, CWS, Kim RN, BSN Entered By: Linton Ham on 03/30/2019 13:31:29 Nina Perez ([id number]) -------------------------------------------------------------------------------- HPI Details Patient Name: Nina Perez Date of Service: 03/30/2019 12:45 PM Medical Record Number: [id number] Patient Account Number:  [id number] Date of Birth/Sex: 21-Jan-1941 (78 y.o. F) Treating RN: Cornell Barman Primary Care Provider: Einar Pheasant Other Clinician: Referring Provider: Einar Pheasant Treating Provider/Extender: Tito Dine in Treatment: 4 History of Present Illness HPI Description: ADMISSION 03/02/2019 Nina Perez is a 78 year old woman with a complicated past medical history who presents for review of wound on the left lateral foot for the last 6 weeks. She states she simply looked down and saw a black spot on the left lateral foot and thought this was dirt went to wipe it off and there was an open wound. This is not gotten any better and over the timeframe. She has been using topical triple antibiotic ointment. She has a history of chronic venous insufficiency and has had wounds on her lower legs related to this but never on her feet. She is known to Dr. Aleda Perez at [facility] in [place] and she went to see him about this. He did not order any specific tests and said that he was not exactly sure what it because this per the patient's description. She was put in an [employer] but it looked as though this was stopped on 10/8. She had a week's worth of doxycycline as well. Patient states the area is very painful rating it at a 10 out of 10. She does not really describe claudication element. 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 X-ray we ordered last week was negative for any bony abnormality The patient had venous studies on 06/16/2018 in the [facility] system. There was no thrombosis and they did not comment on reflux. She has had previous vein surgery ABIs in this clinic were 0.96 on the right and 0.88 on the left 10/28; the patient arrives with a small punched out area on the left lateral foot. This  appears somewhat better than last week but still with a nonviable  surface. I did not feel when I admitted her last week that she had significant arterial disease. She definitely has chronic venous insufficiency and multiple other rheumatologic issues including rheumatoid arthritis, scleroderma crest syndrome but the exact etiology of this wound is not really clear in its location. We used Santyl 11/4; small punched out area on the left anterior lateral foot. The exact reason for this wound is not really clear. She has venous disease however I am not certain that this explains the presentation like this. She saw Dr. Aleda Perez of [facility] before she came into this clinic. She does have other possible alternatives for this wound. I do not believe she has an arterial issue however she does have rheumatoid arthritis, crest syndrome etc. I have been concerned to actually biopsy this however unless of course it worsens 11/11; small punched out area on the left anterior lateral foot. The cause of this is never really clear. She has venous disease previous venous stripping and was followed by Dr. Aleda Perez of [facility]. She saw Dr. Ozella Perez office in 2018 once. She does not want to go back there. She was not felt to have a DVT or evidence of reflux at that time. Although it was difficult to sort out I really do not believe this lady has had formal arterial studies. The area on the left lateral foot still has debris requiring debridement. This is disappointing she has been using Santyl. 11/18; small punched out area on the left lateral foot. Using Santyl and certainly the adherent debris at the base of this is a lot better. She is going for arterial studies later today at Dr. Gwenlyn Perez and Dr. Cyndi Perez office in [place]. If these show adequate blood flow I think we need to reach out to her rheumatologist about discontinuing the methotrexate. The cause of this wound has never really  been clear to me. She does however have a complicated rheumatologic history [see above] Electronic Signature(s) Signed: 03/30/2019 5:09:09 PM By: Linton Ham MD Entered By: Linton Ham on 03/30/2019 13:32:47 AMEILIA, Nina Perez ([id number]) Nina Perez, Nina Perez ([id number]) -------------------------------------------------------------------------------- Physical Exam Details Patient Name: Nina Perez Date of Service: 03/30/2019 12:45 PM Medical Record Number: [id number] Patient Account Number: [id number] Date of Birth/Sex: [birth year] (78 y.o. F) Treating RN: Cornell Barman Primary Care Provider: Einar Pheasant Other Clinician: Referring Provider: Einar Pheasant Treating Provider/Extender: Tito Dine in Treatment: 4 Constitutional Patient is hypertensive.. Pulse regular and within target range for patient.Marland Kitchen Respirations regular, non-labored and within target range.. Temperature is normal and within the target range for the patient.Marland Kitchen appears in no distress. Eyes Conjunctivae clear. No discharge. Respiratory Respiratory effort is easy and symmetric bilaterally. Rate is normal at rest and on room air.. Cardiovascular Dorsalis pedis pulses palpable but certainly not robust in her feet are cool. Integumentary (Hair, Skin) No systemic skin issue is seen. Psychiatric No evidence of depression, anxiety, or agitation. Calm, cooperative, and communicative. Appropriate interactions and affect.. Notes Wound exam; using #3 curette the wound is debrided. The debridement is done with much more ease than when she first came in. Wound bed cleans up quite nicely although there is really been no improvement in wound depth which is reasonably substantial compared to the circumference. There is no evidence of infection Electronic Signature(s) Signed: 03/30/2019 5:09:09 PM By: Linton Ham MD Entered By: Linton Ham on 03/30/2019 13:34:12 Nina Perez  ([id number]) -------------------------------------------------------------------------------- Physician Orders Details Patient Name: Nina Perez Date of Service: 03/30/2019 12:45  PM Medical Record Number: [id number] Patient Account Number: [id number] Date of Birth/Sex: 02/15/1941 (78 y.o. F) Treating RN: Cornell Barman Primary Care Provider: Einar Pheasant Other Clinician: Referring Provider: Einar Pheasant Treating Provider/Extender: Tito Dine in Treatment: 4 Verbal / Phone Orders: No Diagnosis Coding Wound Cleansing Wound #1 Left,Lateral Foot o Clean wound with Normal Saline. o May Shower, gently pat wound dry prior to applying new dressing. Anesthetic (add to Medication List) Wound #1 Left,Lateral Foot o Topical Lidocaine 4% cream applied to wound bed prior to debridement (In Clinic Only). Primary Wound Dressing Wound #1 Left,Lateral Foot o Silver Collagen - moistened with Saline Secondary Dressing Wound #1 Left,Lateral Foot o ABD and Kerlix/Conform Dressing Change Frequency Wound #1 Left,Lateral Foot o Change dressing every day. Follow-up Appointments Wound #1 Left,Lateral Foot o Return Appointment in 1 week. Edema Control Wound #1 Left,Lateral Foot o Patient to wear own compression stockings o Elevate legs to the level of the heart and pump ankles as often as possible Off-Loading Wound #1 Left,Lateral Foot o Other: - keep pressure off of wound area Additional Orders / Instructions Wound #1 Left,Lateral Foot o Activity as tolerated Electronic Signature(s) Nina Perez, Nina Perez ([id number]) Signed: 03/30/2019 5:09:09 PM By: Linton Ham MD Signed: 03/31/2019 1:15:06 PM By: Gretta Cool, BSN, RN, CWS, Kim RN, BSN Entered By: Gretta Cool, BSN, RN, CWS, Kim on 03/30/2019 13:18:55 Nina Perez ([id number]) -------------------------------------------------------------------------------- Problem List Details Patient Name: Nina Perez Date of  Service: 03/30/2019 12:45 PM Medical Record Number: [id number] Patient Account Number: [id number] Date of Birth/Sex: 1940/10/21 (78 y.o. F) Treating RN: Cornell Barman Primary Care Provider: Einar Pheasant Other Clinician: Referring Provider: Einar Pheasant Treating Provider/Extender: Tito Dine in Treatment: 4 Active Problems ICD-10 Evaluated Encounter Code Description Active Date Today Diagnosis E11.621 Type 2 diabetes mellitus with foot ulcer 03/02/2019 No Yes I87.323 Chronic venous hypertension (idiopathic) with inflammation of 03/02/2019 No Yes bilateral lower extremity L97.528 Non-pressure chronic ulcer of other part of left foot with other 03/02/2019 No Yes specified severity M05.7A Rheumatoid arthritis with rheumatoid factor of other specified 03/02/2019 No Yes site without organ or systems involvement Inactive Problems Resolved Problems Electronic Signature(s) Signed: 03/30/2019 5:09:09 PM By: Linton Ham MD Entered By: Linton Ham on 03/30/2019 13:31:08 Nina Perez ([id number]) -------------------------------------------------------------------------------- Progress Note Details Patient Name: Nina Perez Date of Service: 03/30/2019 12:45 PM Medical Record Number: [id number] Patient Account Number: [id number] Date of Birth/Sex: 09/12/1940 (78 y.o. F) Treating RN: Cornell Barman Primary Care Provider: Einar Pheasant Other Clinician: Referring Provider: Einar Pheasant Treating Provider/Extender: Tito Dine in Treatment: 4 Subjective History of Present Illness (HPI) ADMISSION 03/02/2019 Mrs. Schmehl is a 78 year old woman with a complicated past medical history who presents for review of wound on the left lateral foot for the last 6 weeks. She states she simply looked down and saw a black spot on the left lateral foot and thought this was dirt went to wipe it off and there was an open wound. This is not gotten any better and over the  timeframe. She has been using topical triple antibiotic ointment. She has a history of chronic venous insufficiency and has had wounds on her lower legs related to this but never on her feet. She is known to Dr. Aleda Perez at [facility] in [place] and she went to see him about this. He did not order any specific tests and said that he was not exactly sure what it because this per the patient's description. She was  put in an [employer] but it looked as though this was stopped on 10/8. She had a week's worth of doxycycline as well. Patient states the area is very painful rating it at a 10 out of 10. She does not really describe claudication element. 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 X-ray we ordered last week was negative for any bony abnormality The patient had venous studies on 06/16/2018 in the [facility] system. There was no thrombosis and they did not comment on reflux. She has had previous vein surgery ABIs in this clinic were 0.96 on the right and 0.88 on the left 10/28; the patient arrives with a small punched out area on the left lateral foot. This appears somewhat better than last week but still with a nonviable surface. I did not feel when I admitted her last week that she had significant arterial disease. She definitely has chronic venous insufficiency and multiple other rheumatologic issues including rheumatoid arthritis, scleroderma crest syndrome but the exact etiology of this wound is not really clear in its location. We used Santyl 11/4; small punched out area on the left anterior lateral foot. The exact reason for this wound is not really clear. She has venous disease however I am not certain that this explains the presentation like this. She saw Dr. Aleda Perez of [facility]  before she came into this clinic. She does have other possible alternatives for this wound. I do not believe she has an arterial issue however she does have rheumatoid arthritis, crest syndrome etc. I have been concerned to actually biopsy this however unless of course it worsens 11/11; small punched out area on the left anterior lateral foot. The cause of this is never really clear. She has venous disease previous venous stripping and was followed by Dr. Aleda Perez of [facility]. She saw Dr. Ozella Perez office in 2018 once. She does not want to go back there. She was not felt to have a DVT or evidence of reflux at that time. Although it was difficult to sort out I really do not believe this lady has had formal arterial studies. The area on the left lateral foot still has debris requiring debridement. This is disappointing she has been using Santyl. 11/18; small punched out area on the left lateral foot. Using Santyl and certainly the adherent debris at the base of this is a lot better. She is going for arterial studies later today at Dr. Gwenlyn Perez and Dr. Cyndi Perez office in [place]. If these show adequate blood flow I think we need to reach out to her rheumatologist about discontinuing the methotrexate. The cause of this wound has never really been clear to me. She does however have a complicated rheumatologic history [see above] Nina Perez, Nina Perez ([id number]) Objective Constitutional Patient is hypertensive.. Pulse regular and within target range for patient.Marland Kitchen Respirations regular, non-labored and within target range.. Temperature is normal and within the target range for the patient.Marland Kitchen appears in no distress. Vitals Time Taken: 12:48 PM, Height: 66 in, Weight: 230 lbs, BMI: 37.1, Temperature: 98.5 F, Pulse: 67 bpm, Respiratory Rate: 16 breaths/min, Blood Pressure: 167/72 mmHg. Eyes Conjunctivae clear. No discharge. Respiratory Respiratory effort is easy and symmetric bilaterally.  Rate is normal at rest and on room air.. Cardiovascular Dorsalis pedis pulses palpable but certainly not robust in her feet are cool. Psychiatric No evidence of  depression, anxiety, or agitation. Calm, cooperative, and communicative. Appropriate interactions and affect.. General Notes: Wound exam; using #3 curette the wound is debrided. The debridement is done with much more ease than when she first came in. Wound bed cleans up quite nicely although there is really been no improvement in wound depth which is reasonably substantial compared to the circumference. There is no evidence of infection Integumentary (Hair, Skin) No systemic skin issue is seen. Wound #1 status is Open. Original cause of wound was Gradually Appeared. The wound is located on the Left,Lateral Foot. The wound measures 1.7cm length x 0.8cm width x 0.5cm depth; 1.068cm^2 area and 0.534cm^3 volume. There is Fat Layer (Subcutaneous Tissue) Exposed exposed. There is no tunneling or undermining noted. There is a medium amount of serosanguineous drainage noted. The wound margin is flat and intact. There is small (1-33%) red granulation within the wound bed. There is a large (67-100%) amount of necrotic tissue within the wound bed including Eschar and Adherent Slough. Assessment Active Problems ICD-10 Type 2 diabetes mellitus with foot ulcer Chronic venous hypertension (idiopathic) with inflammation of bilateral lower extremity Non-pressure chronic ulcer of other part of left foot with other specified severity Rheumatoid arthritis with rheumatoid factor of other specified site without organ or systems involvement AUSTYNN, Nina Perez. ([id number]) Procedures Wound #1 Pre-procedure diagnosis of Wound #1 is a Diabetic Wound/Ulcer of the Lower Extremity located on the Left,Lateral Foot .Severity of Tissue Pre Debridement is: Fat layer exposed. There was a Selective/Open Wound Non-Viable Tissue Debridement with a total area of 1.36 sq cm  performed by Ricard Dillon, MD. With the following instrument(s): Curette to remove Viable and Non-Viable tissue/material. Material removed includes Salcha after achieving pain control using Lidocaine. No specimens were taken. A time out was conducted at 13:15, prior to the start of the procedure. A Moderate amount of bleeding was controlled with Pressure. The procedure was tolerated well. Post Debridement Measurements: 1.7cm length x 0.8cm width x 0.5cm depth; 0.534cm^3 volume. Character of Wound/Ulcer Post Debridement is stable. Severity of Tissue Post Debridement is: Fat layer exposed. Post procedure Diagnosis Wound #1: Same as Pre-Procedure Plan Wound Cleansing: Wound #1 Left,Lateral Foot: Clean wound with Normal Saline. May Shower, gently pat wound dry prior to applying new dressing. Anesthetic (add to Medication List): Wound #1 Left,Lateral Foot: Topical Lidocaine 4% cream applied to wound bed prior to debridement (In Clinic Only). Primary Wound Dressing: Wound #1 Left,Lateral Foot: Silver Collagen - moistened with Saline Secondary Dressing: Wound #1 Left,Lateral Foot: ABD and Kerlix/Conform Dressing Change Frequency: Wound #1 Left,Lateral Foot: Change dressing every day. Follow-up Appointments: Wound #1 Left,Lateral Foot: Return Appointment in 1 week. Edema Control: Wound #1 Left,Lateral Foot: Patient to wear own compression stockings Elevate legs to the level of the heart and pump ankles as often as possible Off-Loading: Wound #1 Left,Lateral Foot: Other: - keep pressure off of wound area Additional Orders / Instructions: Wound #1 Left,Lateral Foot: Activity as tolerated 1. I have changed the dressing to moistened silver collagen to see if we can stimulate some granulation. Although I had to do debridement today it was reasonably easy to get to a healthy looking surface 2. Going for her arterial studies this afternoon in [place]. If her blood flow is adequate  i.e. not an explanation for this wound then I wonder about backing down on the methotrexate but I would like to talk to her rheumatologist about this Nina Perez, Nina Baller T. ([id number]) Nina Perez. 3. She does not have a lot  of active synovitis or pain however she does have an exceptionally complicated rheumatologic history. Electronic Signature(s) Signed: 03/30/2019 5:09:09 PM By: Linton Ham MD Entered By: Linton Ham on 03/30/2019 13:35:37 Nina Perez ([id number]) -------------------------------------------------------------------------------- [place] Details Patient Name: Nina Perez Date of Service: 03/30/2019 Medical Record Number: [id number] Patient Account Number: [id number] Date of Birth/Sex: July 23, 1940 (78 y.o. F) Treating RN: Cornell Barman Primary Care Provider: Einar Pheasant Other Clinician: Referring Provider: Einar Pheasant Treating Provider/Extender: Tito Dine in Treatment: 4 Diagnosis Coding ICD-10 Codes Code Description E11.621 Type 2 diabetes mellitus with foot ulcer I87.323 Chronic venous hypertension (idiopathic) with inflammation of bilateral lower extremity L97.528 Non-pressure chronic ulcer of other part of left foot with other specified severity M05.7A Rheumatoid arthritis with rheumatoid factor of other specified site without organ or systems involvement Facility Procedures CPT4 Code Description: [id number] - DEBRIDE WOUND 1ST 20 SQ CM OR < ICD-10 Diagnosis Description L97.528 Non-pressure chronic ulcer of other part of left foot with other s E11.621 Type 2 diabetes mellitus with foot ulcer Modifier: pecified sever Quantity: 1 ity Physician Procedures CPT4 Code Description: [id number] - WC PHYS DEBR WO ANESTH 20 SQ CM ICD-10 Diagnosis Description L97.528 Non-pressure chronic ulcer of other part of left foot with other s E11.621 Type 2 diabetes mellitus with foot ulcer Modifier: pecified severi Quantity: 1 ty Electronic  Signature(s) Signed: 03/30/2019 5:09:09 PM By: Linton Ham MD Entered By: Linton Ham on 03/30/2019 13:36:02

## 2019-04-06 ENCOUNTER — Other Ambulatory Visit: Payer: Self-pay

## 2019-04-06 ENCOUNTER — Encounter: Payer: Medicare Other | Admitting: Physician Assistant

## 2019-04-06 DIAGNOSIS — E11621 Type 2 diabetes mellitus with foot ulcer: Secondary | ICD-10-CM | POA: Diagnosis not present

## 2019-04-07 NOTE — Progress Notes (Signed)
Nina Perez, Nina Perez ([id number]) Visit Report for 04/06/2019 Chief Complaint Document Details Patient Name: Nina Perez, Nina Perez Date of Service: 04/06/2019 2:15 PM Medical Record Number: [id number] Patient Account Number: [id number] Date of Birth/Sex: 1940/08/25 (78 y.o. F) Treating RN: Cornell Barman Primary Care Provider: Einar Pheasant Other Clinician: Referring Provider: Einar Pheasant Treating Provider/Extender: Melburn Hake, HOYT Weeks in Treatment: 5 Information Obtained from: Patient Chief Complaint 03/02/2019; patient is here for review of the wound on her left lateral foot x6 weeks Electronic Signature(s) Signed: 04/06/2019 4:47:05 PM By: Worthy Keeler PA-C Entered By: Worthy Keeler on 04/06/2019 14:26:33 Nina Perez ([id number]) -------------------------------------------------------------------------------- Debridement Details Patient Name: Nina Perez Date of Service: 04/06/2019 2:15 PM Medical Record Number: [id number] Patient Account Number: [id number] Date of Birth/Sex: Oct 20, 1940 (78 y.o. F) Treating RN: Cornell Barman Primary Care Provider: Einar Pheasant Other Clinician: Referring Provider: Einar Pheasant Treating Provider/Extender: Melburn Hake, HOYT Weeks in Treatment: 5 Debridement Performed for Wound #1 Left,Lateral Foot Assessment: Performed By: Physician STONE III, HOYT E., PA-C Debridement Type: Debridement Severity of Tissue Pre Fat layer exposed Debridement: Level of Consciousness (Pre- Awake and Alert procedure): Pre-procedure Verification/Time Yes - 14:50 Out Taken: Start Time: 14:50 Pain Control: Lidocaine Total Area Debrided (L x W): 1.7 (cm) x 1 (cm) = 1.7 (cm) Tissue and other material Viable, Non-Viable, Slough, Subcutaneous, Slough debrided: Level: Skin/Subcutaneous Tissue Debridement Description: Excisional Instrument: Curette Bleeding: Minimum Hemostasis Achieved: Pressure End Time: 14:52 Response to Treatment: Procedure was tolerated  well Level of Consciousness Awake and Alert (Post-procedure): Post Debridement Measurements of Total Wound Length: (cm) 1.7 Width: (cm) 1 Depth: (cm) 0.6 Volume: (cm) 0.801 Character of Wound/Ulcer Post Debridement: Stable Severity of Tissue Post Debridement: Fat layer exposed Post Procedure Diagnosis Same as Pre-procedure Electronic Signature(s) Signed: 04/06/2019 4:47:05 PM By: Worthy Keeler PA-C Signed: 04/06/2019 5:12:33 PM By: Gretta Cool, BSN, RN, CWS, Kim RN, BSN Entered By: Gretta Cool, BSN, RN, CWS, Kim on 04/06/2019 14:51:42 Nina Perez ([id number]) -------------------------------------------------------------------------------- HPI Details Patient Name: Nina Perez Date of Service: 04/06/2019 2:15 PM Medical Record Number: [id number] Patient Account Number: [id number] Date of Birth/Sex: August 07, 1940 (78 y.o. F) Treating RN: Cornell Barman Primary Care Provider: Einar Pheasant Other Clinician: Referring Provider: Einar Pheasant Treating Provider/Extender: Melburn Hake, HOYT Weeks in Treatment: 5 History of Present Illness HPI Description: ADMISSION 03/02/2019 Mrs. Garmon is a 78 year old woman with a complicated past medical history who presents for review of wound on the left lateral foot for the last 6 weeks. She states she simply looked down and saw a black spot on the left lateral foot and thought this was dirt went to wipe it off and there was an open wound. This is not gotten any better and over the timeframe. She has been using topical triple antibiotic ointment. She has a history of chronic venous insufficiency and has had wounds on her lower legs related to this but never on her feet. She is known to Dr. Aleda Grana at [facility] in [place] and she went to see him about this. He did not order any specific tests and said that he was not exactly sure what it because this per the patient's description. She was put in an [employer] but it looked as though this  was stopped on 10/8. She had a week's worth of doxycycline as well. Patient states the area is very painful rating it at a 10 out of 10. She does not really describe claudication element. Past medical history; scleroderma Crest syndromes,  pulmonary hypertension, rheumatoid arthritis, chronic hypoxemic respiratory failure, type 2 diabetes on oral agents hemoglobin A1c at 6.3, she has a history of skin cancer. X-ray we ordered last week was negative for any bony abnormality The patient had venous studies on 06/16/2018 in the [facility] system. There was no thrombosis and they did not comment on reflux. She has had previous vein surgery ABIs in this clinic were 0.96 on the right and 0.88 on the left 10/28; the patient arrives with a small punched out area on the left lateral foot. This appears somewhat better than last week but still with a nonviable surface. I did not feel when I admitted her last week that she had significant arterial disease. She definitely has chronic venous insufficiency and multiple other rheumatologic issues including rheumatoid arthritis, scleroderma crest syndrome but the exact etiology of this wound is not really clear in its location. We used Santyl 11/4; small punched out area on the left anterior lateral foot. The exact reason for this wound is not really clear. She has venous disease however I am not certain that this explains the presentation like this. She saw Dr. Aleda Grana of [facility] before she came into this clinic. She does have other possible alternatives for this wound. I do not believe she has an arterial issue however she does have rheumatoid arthritis, crest syndrome etc. I have been concerned to actually biopsy this however unless of course it worsens 11/11; small punched out area on the left anterior lateral foot. The cause of this is never really clear. She has venous disease previous venous stripping and was followed by Dr. Aleda Grana of [facility]. She saw Dr. Ozella Almond office in 2018 once. She does not want to go back there. She was not felt to have a DVT or evidence of reflux at that time. Although it was difficult to sort out I really do not believe this lady has had formal arterial studies. The area on the left lateral foot still has debris requiring debridement. This is disappointing she has been using Santyl. 11/18; small punched out area on the left lateral foot. Using Santyl and certainly the adherent debris at the base of this is a lot better. She is going for arterial studies later today at Dr. Gwenlyn Found and Dr. Cyndi Lennert office in [place]. If these show adequate blood flow I think we need to reach out to her rheumatologist about discontinuing the methotrexate. The cause of this wound has never really been clear to me. She does however have a complicated rheumatologic history [see above] 04/06/2019 on evaluation today patient appears to be doing well at this point with regard to her wound. The size is really about the same as previously noted. With that being said this is the first time I seen her but it appears that she has a lot more slough buildup than she did last week when collagen was used. This is something that the patient's case manager nurse also stated to me as well. I really feel like the Santyl likely is going to do better for her. She does have an appointment with Dr. Naida Sleight office on December 1. Nina Perez, Nina Perez ([id number]) Electronic Signature(s) Signed: 04/06/2019 4:47:05 PM By: Worthy Keeler PA-C Entered By: Worthy Keeler on 04/06/2019 14:55:00 Nina Perez ([id number]) -------------------------------------------------------------------------------- Physical Exam Details Patient Name: Nina Perez Date of Service: 04/06/2019 2:15 PM Medical Record Number: [id number] Patient Account Number: [id number] Date of Birth/Sex: [birth year] (78 y.o. F) Treating  RN: Cornell Barman Primary Care Provider: Einar Pheasant Other Clinician: Referring Provider: Einar Pheasant Treating Provider/Extender: Melburn Hake, HOYT Weeks in Treatment: 5 Constitutional Well-nourished and well-hydrated in no acute distress. Respiratory normal breathing without difficulty. clear to auscultation bilaterally. Cardiovascular regular rate and rhythm with normal S1, S2. Psychiatric this patient is able to make decisions and demonstrates good insight into disease process. Alert and Oriented x 3. pleasant and cooperative. Notes Patient's wound bed currently showed signs of significant slough buildup over the surface of the wound which did require sharp debridement today I used a #3 curette to debride the wound in order to help clear away some of the slough and biofilm. She tolerated this today without significant pain though she did have some pain and we did have to stop although I had cleaned off the majority of the wound at that point. Post debridement things appear to be doing better but is still very slow healing. I really think she would do better with the Santyl then she is with the collagen at this point. Electronic Signature(s) Signed: 04/06/2019 4:47:05 PM By: Worthy Keeler PA-C Entered By: Worthy Keeler on 04/06/2019 14:55:37 Nina Perez ([id number]) -------------------------------------------------------------------------------- Physician Orders Details Patient Name: Nina Perez Date of Service: 04/06/2019 2:15 PM Medical Record Number: [id number] Patient Account Number: [id number] Date of Birth/Sex: 1940-09-24 (78 y.o. F) Treating RN: Cornell Barman Primary Care Provider: Einar Pheasant Other Clinician: Referring Provider: Einar Pheasant Treating Provider/Extender: Melburn Hake, HOYT Weeks in Treatment: 5 Verbal / Phone Orders: No Diagnosis Coding ICD-10 Coding Code Description E11.621 Type 2 diabetes mellitus with foot ulcer I87.323 Chronic venous hypertension (idiopathic) with inflammation of  bilateral lower extremity L97.528 Non-pressure chronic ulcer of other part of left foot with other specified severity M05.7A Rheumatoid arthritis with rheumatoid factor of other specified site without organ or systems involvement Wound Cleansing Wound #1 Left,Lateral Foot o Clean wound with Normal Saline. o May Shower, gently pat wound dry prior to applying new dressing. Anesthetic (add to Medication List) Wound #1 Left,Lateral Foot o Topical Lidocaine 4% cream applied to wound bed prior to debridement (In Clinic Only). Primary Wound Dressing Wound #1 Left,Lateral Foot o Santyl Ointment Secondary Dressing Wound #1 Left,Lateral Foot o ABD and Kerlix/Conform Dressing Change Frequency Wound #1 Left,Lateral Foot o Change dressing every day. Follow-up Appointments Wound #1 Left,Lateral Foot o Return Appointment in 1 week. Edema Control Wound #1 Left,Lateral Foot o Patient to wear own compression stockings o Elevate legs to the level of the heart and pump ankles as often as possible Off-Loading Wound #1 Left,Lateral Foot o Other: - keep pressure off of wound area Nina Perez, Nina T. ([id number]) Additional Orders / Instructions Wound #1 Left,Lateral Foot o Activity as tolerated Medications-please add to medication list. Wound #1 Left,Lateral Foot o Santyl Enzymatic Ointment Patient Medications Allergies: Maxidex Notifications Medication Indication Start End Santyl 04/06/2019 DOSE topical 250 unit/gram ointment - ointment topical Apply nickel thick to the wound bed and then cover with a dressing as directed in clinic. Electronic Signature(s) Signed: 04/06/2019 2:56:49 PM By: Worthy Keeler PA-C Entered By: Worthy Keeler on 04/06/2019 14:56:48 Nina Perez ([id number]) -------------------------------------------------------------------------------- Problem List Details Patient Name: Nina Perez Date of Service: 04/06/2019 2:15 PM Medical Record  Number: [id number] Patient Account Number: [id number] Date of Birth/Sex: [birth year] (78 y.o. F) Treating RN: Cornell Barman Primary Care Provider: Einar Pheasant Other Clinician: Referring Provider: Einar Pheasant Treating Provider/Extender: Melburn Hake, HOYT Weeks in Treatment: 5 Active Problems ICD-10  Evaluated Encounter Code Description Active Date Today Diagnosis E11.621 Type 2 diabetes mellitus with foot ulcer 03/02/2019 No Yes I87.323 Chronic venous hypertension (idiopathic) with inflammation of 03/02/2019 No Yes bilateral lower extremity L97.528 Non-pressure chronic ulcer of other part of left foot with other 03/02/2019 No Yes specified severity M05.7A Rheumatoid arthritis with rheumatoid factor of other specified 03/02/2019 No Yes site without organ or systems involvement Inactive Problems Resolved Problems Electronic Signature(s) Signed: 04/06/2019 4:47:05 PM By: Worthy Keeler PA-C Entered By: Worthy Keeler on 04/06/2019 14:26:29 Nina Perez ([id number]) -------------------------------------------------------------------------------- Progress Note Details Patient Name: Nina Perez Date of Service: 04/06/2019 2:15 PM Medical Record Number: [id number] Patient Account Number: [id number] Date of Birth/Sex: 1941/02/02 (78 y.o. F) Treating RN: Cornell Barman Primary Care Provider: Einar Pheasant Other Clinician: Referring Provider: Einar Pheasant Treating Provider/Extender: Melburn Hake, HOYT Weeks in Treatment: 5 Subjective Chief Complaint Information obtained from Patient 03/02/2019; patient is here for review of the wound on her left lateral foot x6 weeks History of Present Illness (HPI) ADMISSION 03/02/2019 Mrs. Ark is a 78 year old woman with a complicated past medical history who presents for review of wound on the left lateral foot for the last 6 weeks. She states she simply looked down and saw a black spot on the left lateral foot and thought this was dirt went to  wipe it off and there was an open wound. This is not gotten any better and over the timeframe. She has been using topical triple antibiotic ointment. She has a history of chronic venous insufficiency and has had wounds on her lower legs related to this but never on her feet. She is known to Dr. Aleda Grana at [facility] in [place] and she went to see him about this. He did not order any specific tests and said that he was not exactly sure what it because this per the patient's description. She was put in an [employer] but it looked as though this was stopped on 10/8. She had a week's worth of doxycycline as well. Patient states the area is very painful rating it at a 10 out of 10. She does not really describe claudication element. 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 X-ray we ordered last week was negative for any bony abnormality The patient had venous studies on 06/16/2018 in the [facility] system. There was no thrombosis and they did not comment on reflux. She has had previous vein surgery ABIs in this clinic were 0.96 on the right and 0.88 on the left 10/28; the patient arrives with a small punched out area on the left lateral foot. This appears somewhat better than last week but still with a nonviable surface. I did not feel when I admitted her last week that she had significant arterial disease. She definitely has chronic venous insufficiency and multiple other rheumatologic issues including rheumatoid arthritis, scleroderma crest syndrome but the exact etiology of this wound is not really clear in its location. We used Santyl 11/4; small punched out area on the left anterior lateral foot. The exact reason for this wound is not really clear. She has venous disease however I am not certain that this  explains the presentation like this. She saw Dr. Aleda Grana of [facility] before she came into this clinic. She does have other possible alternatives for this wound. I do not believe  she has an arterial issue however she does have rheumatoid arthritis, crest syndrome etc. I have been concerned to actually biopsy this however unless of course it worsens 11/11; small punched out area on the left anterior lateral foot. The cause of this is never really clear. She has venous disease previous venous stripping and was followed by Dr. Aleda Grana of [facility]. She saw Dr. Ozella Almond office in 2018 once. She does not want to go back there. She was not felt to have a DVT or evidence of reflux at that time. Although it was difficult to sort out I really do not believe this lady has had formal arterial studies. The area on the left lateral foot still has debris requiring debridement. This is disappointing she has been using Santyl. 11/18; small punched out area on the left lateral foot. Using Santyl and certainly the adherent debris at the base of this is a lot better. She is going for arterial studies later today at Dr. Gwenlyn Found and Dr. Cyndi Lennert office in [place] at [place]. If these show adequate blood flow I think we need to reach out to her rheumatologist about discontinuing the methotrexate. The cause of this wound has never really been clear to me. She does however have a complicated rheumatologic history [see above] Nina Perez, Nina Perez ([id number]) 04/06/2019 on evaluation today patient appears to be doing well at this point with regard to her wound. The size is really about the same as previously noted. With that being said this is the first time I seen her but it appears that she has a lot more slough buildup than she did last week when collagen was used. This is something that the patient's case manager nurse also stated to me as well. I really feel like the Santyl likely is going to do better for  her. She does have an appointment with Dr. Naida Sleight office on December 1. Patient History Information obtained from Patient. Family History Heart Disease - Father, No family history of Cancer, Diabetes, Hypertension, Kidney Disease, Lung Disease, Seizures, Stroke, Thyroid Problems, Tuberculosis. Social History Former smoker - 30 years ago, Marital Status - Married, Alcohol Use - Never, Drug Use - No History, Caffeine Use - Daily. Medical History Eyes Denies history of Cataracts, Glaucoma, Optic Neuritis Ear/Nose/Mouth/Throat Denies history of Chronic sinus problems/congestion, Middle ear problems Hematologic/Lymphatic Denies history of Anemia, Hemophilia, Human Immunodeficiency Virus, Lymphedema, Sickle Cell Disease Respiratory Denies history of Aspiration, Asthma, Chronic Obstructive Pulmonary Disease (COPD), Pneumothorax, Sleep Apnea, Tuberculosis Cardiovascular Patient has history of Hypertension Denies history of Angina, Arrhythmia, Congestive Heart Failure, Coronary Artery Disease, Deep Vein Thrombosis, Hypotension, Myocardial Infarction, Peripheral Arterial Disease, Peripheral Venous Disease, Phlebitis, Vasculitis Gastrointestinal Denies history of Cirrhosis , Colitis, Crohn s, Hepatitis A, Hepatitis B, Hepatitis C Endocrine Patient has history of Type II Diabetes Genitourinary Denies history of End Stage Renal Disease Immunological Patient has history of Scleroderma Denies history of Lupus Erythematosus, Raynaud s Integumentary (Skin) Denies history of History of Burn, History of pressure wounds Musculoskeletal Patient has history of Gout, Rheumatoid Arthritis Denies history of Osteoarthritis, Osteomyelitis Neurologic Denies history of Dementia, Neuropathy, Quadriplegia, Paraplegia, Seizure Disorder Oncologic Denies history of Received Chemotherapy, Received Radiation Psychiatric Denies history of Anorexia/bulimia, Confinement Anxiety Review of Systems  (ROS) Constitutional Symptoms (General Health) Denies complaints or symptoms of Fatigue, Fever, Chills, Marked Weight Change. Respiratory Denies complaints or symptoms of Chronic or frequent coughs, Shortness of Breath. Cardiovascular Nina Perez, Nina Perez ([id number]) Denies complaints or symptoms of Chest pain, LE edema.  Psychiatric Denies complaints or symptoms of Anxiety, Claustrophobia. Objective Constitutional Well-nourished and well-hydrated in no acute distress. Vitals Time Taken: 2:22 PM, Height: 66 in, Weight: 230 lbs, BMI: 37.1, Temperature: 98.4 F, Pulse: 70 bpm, Respiratory Rate: 18 breaths/min, Blood Pressure: 168/88 mmHg. Respiratory normal breathing without difficulty. clear to auscultation bilaterally. Cardiovascular regular rate and rhythm with normal S1, S2. Psychiatric this patient is able to make decisions and demonstrates good insight into disease process. Alert and Oriented x 3. pleasant and cooperative. General Notes: Patient's wound bed currently showed signs of significant slough buildup over the surface of the wound which did require sharp debridement today I used a #3 curette to debride the wound in order to help clear away some of the slough and biofilm. She tolerated this today without significant pain though she did have some pain and we did have to stop although I had cleaned off the majority of the wound at that point. Post debridement things appear to be doing better but is still very slow healing. I really think she would do better with the Santyl then she is with the collagen at this point. Integumentary (Hair, Skin) Wound #1 status is Open. Original cause of wound was Gradually Appeared. The wound is located on the Left,Lateral Foot. The wound measures 1.7cm length x 1cm width x 0.6cm depth; 1.335cm^2 area and 0.801cm^3 volume. There is Fat Layer (Subcutaneous Tissue) Exposed exposed. There is no tunneling or undermining noted. There is a medium amount  of serosanguineous drainage noted. The wound margin is flat and intact. There is small (1-33%) red granulation within the wound bed. There is a large (67-100%) amount of necrotic tissue within the wound bed including Eschar and Adherent Slough. Assessment Active Problems ICD-10 Type 2 diabetes mellitus with foot ulcer Chronic venous hypertension (idiopathic) with inflammation of bilateral lower extremity Non-pressure chronic ulcer of other part of left foot with other specified severity Rheumatoid arthritis with rheumatoid factor of other specified site without organ or systems involvement Nina Perez, THELUSMA. ([id number]) Procedures Wound #1 Pre-procedure diagnosis of Wound #1 is a Diabetic Wound/Ulcer of the Lower Extremity located on the Left,Lateral Foot .Severity of Tissue Pre Debridement is: Fat layer exposed. There was a Excisional Skin/Subcutaneous Tissue Debridement with a total area of 1.7 sq cm performed by STONE III, HOYT E., PA-C. With the following instrument(s): Curette to remove Viable and Non-Viable tissue/material. Material removed includes Subcutaneous Tissue and Slough and after achieving pain control using Lidocaine. No specimens were taken. A time out was conducted at 14:50, prior to the start of the procedure. A Minimum amount of bleeding was controlled with Pressure. The procedure was tolerated well. Post Debridement Measurements: 1.7cm length x 1cm width x 0.6cm depth; 0.801cm^3 volume. Character of Wound/Ulcer Post Debridement is stable. Severity of Tissue Post Debridement is: Fat layer exposed. Post procedure Diagnosis Wound #1: Same as Pre-Procedure Plan Wound Cleansing: Wound #1 Left,Lateral Foot: Clean wound with Normal Saline. May Shower, gently pat wound dry prior to applying new dressing. Anesthetic (add to Medication List): Wound #1 Left,Lateral Foot: Topical Lidocaine 4% cream applied to wound bed prior to debridement (In Clinic Only). Primary Wound  Dressing: Wound #1 Left,Lateral Foot: Santyl Ointment Secondary Dressing: Wound #1 Left,Lateral Foot: ABD and Kerlix/Conform Dressing Change Frequency: Wound #1 Left,Lateral Foot: Change dressing every day. Follow-up Appointments: Wound #1 Left,Lateral Foot: Return Appointment in 1 week. Edema Control: Wound #1 Left,Lateral Foot: Patient to wear own compression stockings Elevate legs to the level of the heart and  pump ankles as often as possible Off-Loading: Wound #1 Left,Lateral Foot: Other: - keep pressure off of wound area Additional Orders / Instructions: Wound #1 Left,Lateral Foot: Activity as tolerated Medications-please add to medication list.: Wound #1 Left,Lateral Foot: Santyl Enzymatic Ointment Nina Perez, Nina Perez ([id number]) The following medication(s) was prescribed: Santyl topical 250 unit/gram ointment ointment topical Apply nickel thick to the wound bed and then cover with a dressing as directed in clinic. starting 04/06/2019 1. I would recommend that we go ahead and send in a prescription for the Santyl for the patient she is in agreement with that plan. 2. I am also going to recommend that we go ahead and continue with the ABD and then con form to secure everything in place. 3. I would recommend as well that she continue to wear her own compression stockings that she is done up to this point. We will see patient back for reevaluation in 1 week here in the clinic. If anything worsens or changes patient will contact our office for additional recommendations. Electronic Signature(s) Signed: 04/06/2019 4:47:05 PM By: Worthy Keeler PA-C Entered By: Worthy Keeler on 04/06/2019 14:56:59 Nina Perez ([id number]) -------------------------------------------------------------------------------- ROS/PFSH Details Patient Name: Nina Perez Date of Service: 04/06/2019 2:15 PM Medical Record Number: [id number] Patient Account Number: [id number] Date of Birth/Sex:  October 18, 1940 (78 y.o. F) Treating RN: Cornell Barman Primary Care Provider: Einar Pheasant Other Clinician: Referring Provider: Einar Pheasant Treating Provider/Extender: Melburn Hake, HOYT Weeks in Treatment: 5 Information Obtained From Patient Constitutional Symptoms (General Health) Complaints and Symptoms: Negative for: Fatigue; Fever; Chills; Marked Weight Change Respiratory Complaints and Symptoms: Negative for: Chronic or frequent coughs; Shortness of Breath Medical History: Negative for: Aspiration; Asthma; Chronic Obstructive Pulmonary Disease (COPD); Pneumothorax; Sleep Apnea; Tuberculosis Cardiovascular Complaints and Symptoms: Negative for: Chest pain; LE edema Medical History: Positive for: Hypertension Negative for: Angina; Arrhythmia; Congestive Heart Failure; Coronary Artery Disease; Deep Vein Thrombosis; Hypotension; Myocardial Infarction; Peripheral Arterial Disease; Peripheral Venous Disease; Phlebitis; Vasculitis Psychiatric Complaints and Symptoms: Negative for: Anxiety; Claustrophobia Medical History: Negative for: Anorexia/bulimia; Confinement Anxiety Eyes Medical History: Negative for: Cataracts; Glaucoma; Optic Neuritis Ear/Nose/Mouth/Throat Medical History: Negative for: Chronic sinus problems/congestion; Middle ear problems Hematologic/Lymphatic Medical History: Negative for: Anemia; Hemophilia; Human Immunodeficiency Virus; Lymphedema; Sickle Cell Disease Nina Perez, Nina Perez ([id number]) Gastrointestinal Medical History: Negative for: Cirrhosis ; Colitis; Crohnos; Hepatitis A; Hepatitis B; Hepatitis C Endocrine Medical History: Positive for: Type II Diabetes Treated with: Oral agents Genitourinary Medical History: Negative for: End Stage Renal Disease Immunological Medical History: Positive for: Scleroderma Negative for: Lupus Erythematosus; Raynaudos Integumentary (Skin) Medical History: Negative for: History of Burn; History of pressure  wounds Musculoskeletal Medical History: Positive for: Gout; Rheumatoid Arthritis Negative for: Osteoarthritis; Osteomyelitis Neurologic Medical History: Negative for: Dementia; Neuropathy; Quadriplegia; Paraplegia; Seizure Disorder Oncologic Medical History: Negative for: Received Chemotherapy; Received Radiation Immunizations Pneumococcal Vaccine: Received Pneumococcal Vaccination: Yes Implantable Devices None Family and Social History Cancer: No; Diabetes: No; Heart Disease: Yes - Father; Hypertension: No; Kidney Disease: No; Lung Disease: No; Seizures: No; Stroke: No; Thyroid Problems: No; Tuberculosis: No; Former smoker - 30 years ago; Marital Status - Married; Alcohol Use: Never; Drug Use: No History; Caffeine Use: Daily; Financial Concerns: No; Food, Clothing or Shelter Needs: No; Support System Lacking: No; Transportation Concerns: No Physician Affirmation I have reviewed and agree with the above information. Nina Perez, Nina Perez ([id number]) Electronic Signature(s) Signed: 04/06/2019 4:47:05 PM By: Worthy Keeler PA-C Signed: 04/06/2019 5:12:33 PM By: Gretta Cool, BSN, RN, CWS, Kim RN,  BSN Entered By: Worthy Keeler on 04/06/2019 14:55:19 Nina Perez ([id number]) -------------------------------------------------------------------------------- [place] Details Patient Name: Nina Perez Date of Service: 04/06/2019 Medical Record Number: [id number] Patient Account Number: [id number] Date of Birth/Sex: [birth year] (78 y.o. 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 M05.7A Rheumatoid arthritis with rheumatoid factor of other specified site without organ or systems involvement Facility Procedures CPT4 Code Description: [id number] - DEB SUBQ TISSUE 20 SQ CM/< ICD-10 Diagnosis Description L97.528 Non-pressure chronic ulcer of other part of left foot with other Modifier: specified severi Quantity: 1 ty Physician Procedures CPT4: Description Modifier Quantity Code [id number] - WC PHYS LEVEL 4 - EST PT 25 1 ICD-10 Diagnosis Description E11.621 Type 2 diabetes mellitus with foot ulcer I87.323 Chronic venous hypertension (idiopathic) with inflammation of bilateral lower  extremity L97.528 Non-pressure chronic ulcer of other part of left foot with other specified severity M05.7A Rheumatoid arthritis with rheumatoid factor of other specified site without organ or systems involvement CPT4: [id number] - WC PHYS SUBQ TISS 20 SQ CM 1 ICD-10 Diagnosis Description L97.528 Non-pressure chronic ulcer of other part of left foot with other specified severity Electronic Signature(s) Signed: 04/06/2019 4:47:05 PM By: Worthy Keeler PA-C Entered By: Worthy Keeler on 04/06/2019 14:57:20

## 2019-04-07 NOTE — Progress Notes (Signed)
NAIYELI, ROTELLA ([id number]) Visit Report for 04/06/2019 Arrival Information Details Patient Name: Nina Perez, Nina Perez Date of Service: 04/06/2019 2:15 PM Medical Record Number: [id number] Patient Account Number: [id number] Date of Birth/Sex: 07-02-1940 (78 y.o. F) Treating RN: Arnette Norris Primary Care Charlsey Moragne: Dale [place] Other Clinician: Referring Jajuan Skoog: Dale [place] Treating Shakeema Lippman/Extender: Linwood Dibbles, HOYT Weeks in Treatment: 5 Visit Information History Since Last Visit Added or deleted any medications: No Patient Arrived: Wheel Chair Any new allergies or adverse reactions: No Arrival Time: 14:22 Had a fall or experienced change in No Accompanied By: husband activities of daily living that may affect Transfer Assistance: None risk of falls: Patient Identification Verified: Yes Signs or symptoms of abuse/neglect since last visito No Secondary Verification Process Completed: Yes Hospitalized since last visit: No Has Dressing in Place as Prescribed: Yes Pain Present Now: No Electronic Signature(s) Signed: 04/06/2019 4:07:24 PM By: Arnette Norris Entered By: Arnette Norris on 04/06/2019 14:22:50 Nina Perez ([id number]) -------------------------------------------------------------------------------- Encounter Discharge Information Details Patient Name: Nina Perez Date of Service: 04/06/2019 2:15 PM Medical Record Number: [id number] Patient Account Number: [id number] Date of Birth/Sex: Jan 02, 1941 (78 y.o. F) Treating RN: Huel Coventry Primary Care Elvena Oyer: Dale [place] Other Clinician: Referring [place] Urieta: Dale [place] Treating Javon Snee/Extender: Linwood Dibbles, HOYT Weeks in Treatment: 5 Encounter Discharge Information Items Post Procedure Vitals Discharge Condition: Stable Temperature (F): 98.4 Ambulatory Status: Ambulatory Pulse (bpm): 70 Discharge Destination: Home Respiratory Rate (breaths/min): 18 Transportation: Private Auto Blood Pressure (mmHg):  168/88 Accompanied By: husband Schedule Follow-up Appointment: Yes Clinical Summary of Care: Electronic Signature(s) Signed: 04/06/2019 5:12:33 PM By: Elliot Gurney, BSN, RN, CWS, Kim RN, BSN Entered By: Elliot Gurney, BSN, RN, CWS, Kim on 04/06/2019 14:54:54 Nina Perez ([id number]) -------------------------------------------------------------------------------- Lower Extremity Assessment Details Patient Name: Nina Perez Date of Service: 04/06/2019 2:15 PM Medical Record Number: [id number] Patient Account Number: [id number] Date of Birth/Sex: [birth year] (78 y.o. F) Treating RN: Arnette Norris Primary Care Lister Brizzi: Dale [place] Other Clinician: Referring Ajahni Nay: Dale [place] Treating Korrina Zern/Extender: Linwood Dibbles, HOYT Weeks in Treatment: 5 Vascular Assessment Pulses: Dorsalis Pedis Palpable: [Left:Yes] Posterior Tibial Palpable: [Left:Yes] Electronic Signature(s) Signed: 04/06/2019 4:07:24 PM By: Arnette Norris Entered By: Arnette Norris on 04/06/2019 14:25:37 Nina Perez ([id number]) -------------------------------------------------------------------------------- Multi Wound Chart Details Patient Name: Nina Perez Date of Service: 04/06/2019 2:15 PM Medical Record Number: [id number] Patient Account Number: [id number] Date of Birth/Sex: 08-Mar-1941 (78 y.o. F) Treating RN: Huel Coventry Primary Care Kada Friesen: Dale [place] Other Clinician: Referring Hulen Mandler: Dale [place] Treating Chip Canepa/Extender: Linwood Dibbles, HOYT Weeks in Treatment: 5 Vital Signs Height(in): 66 Pulse(bpm): 70 Weight(lbs): 230 Blood Pressure(mmHg): 168/88 Body Mass Index(BMI): 37 Temperature(F): 98.4 Respiratory Rate 18 (breaths/min): Photos: [N/A:N/A] Wound Location: Left Foot - Lateral N/A N/A Wounding Event: Gradually Appeared N/A N/A Primary Etiology: Diabetic Wound/Ulcer of the N/A N/A Lower Extremity Comorbid History: Hypertension, Type II N/A N/A Diabetes, Scleroderma,  Gout, Rheumatoid Arthritis Date Acquired: 01/17/2019 N/A N/A Weeks of Treatment: 5 N/A N/A Wound Status: Open N/A N/A Measurements L x W x D 1.7x1x0.6 N/A N/A (cm) Area (cm) : 1.335 N/A N/A Volume (cm) : 0.801 N/A N/A % Reduction in Area: -34.80% N/A N/A % Reduction in Volume: -304.50% N/A N/A Classification: Grade 1 N/A N/A Exudate Amount: Medium N/A N/A Exudate Type: Serosanguineous N/A N/A Exudate Color: red, brown N/A N/A Wound Margin: Flat and Intact N/A N/A Granulation Amount: Small (1-33%) N/A N/A Granulation Quality: Red N/A N/A Necrotic Amount: Large (67-100%) N/A N/A Necrotic Tissue: Eschar, Adherent Slough N/A N/A Exposed Structures:  Fat Layer (Subcutaneous N/A N/A Tissue) Exposed: Yes Fascia: No Tendon: No Nina Perez, Nina Perez ([id number]) Muscle: No Joint: No Bone: No Epithelialization: None N/A N/A Treatment Notes Electronic Signature(s) Signed: 04/06/2019 5:12:33 PM By: Elliot Gurney, BSN, RN, CWS, Kim RN, BSN Entered By: Elliot Gurney, BSN, RN, CWS, Kim on 04/06/2019 14:49:18 Nina Perez ([id number]) -------------------------------------------------------------------------------- Multi-Disciplinary Care Plan Details Patient Name: Nina Perez Date of Service: 04/06/2019 2:15 PM Medical Record Number: [id number] Patient Account Number: [id number] Date of Birth/Sex: [birth year] (78 y.o. F) Treating RN: Huel Coventry Primary Care Mozell Haber: Dale [place] Other Clinician: Referring Mennie Spiller: Dale [place] Treating Hollister Wessler/Extender: Linwood Dibbles, HOYT Weeks in Treatment: 5 Active Inactive Abuse / Safety / Falls / Self Care Management Nursing Diagnoses: Impaired physical mobility Potential for falls Goals: Patient will remain injury free related to falls Date Initiated: 03/02/2019 Target Resolution Date: 03/16/2019 Goal Status: Active Interventions: Assess fall risk on admission and as needed Notes: Medication Nursing Diagnoses: Knowledge deficit related to medication  safety: actual or potential Goals: Patient/caregiver will demonstrate understanding of all current medications Date Initiated: 03/02/2019 Target Resolution Date: 03/17/2019 Goal Status: Active Interventions: Assess for medication contraindications each visit where new medications are prescribed Notes: Necrotic Tissue Nursing Diagnoses: Impaired tissue integrity related to necrotic/devitalized tissue Goals: Necrotic/devitalized tissue will be minimized in the wound bed Date Initiated: 03/02/2019 Target Resolution Date: 03/17/2019 Goal Status: Active Interventions: Nina Perez, Nina Perez ([id number]) Assess patient pain level pre-, during and post procedure and prior to discharge Treatment Activities: Apply topical anesthetic as ordered : 03/02/2019 Notes: Orientation to the Wound Care Program Nursing Diagnoses: Knowledge deficit related to the wound healing center program Goals: Patient/caregiver will verbalize understanding of the Wound Healing Center Program Date Initiated: 03/02/2019 Target Resolution Date: 03/17/2019 Goal Status: Active Interventions: Provide education on orientation to the wound center Notes: Venous Leg Ulcer Nursing Diagnoses: Actual venous Insuffiency (use after diagnosis is confirmed) Goals: Patient/caregiver will verbalize understanding of disease process and disease management Date Initiated: 03/02/2019 Target Resolution Date: 03/17/2019 Goal Status: Active Verify adequate tissue perfusion prior to therapeutic compression application Date Initiated: 03/02/2019 Target Resolution Date: 03/17/2019 Goal Status: Active Interventions: Assess peripheral edema status every visit. Provide education on venous insufficiency Notes: Wound/Skin Impairment Nursing Diagnoses: Impaired tissue integrity Goals: Ulcer/skin breakdown will have a volume reduction of 30% by week 4 Date Initiated: 03/02/2019 Target Resolution Date: 04/02/2019 Goal Status:  Active Interventions: Assess ulceration(s) every visit Nina Perez, Nina Perez ([id number]) Treatment Activities: Topical wound management initiated : 03/02/2019 Notes: Electronic Signature(s) Signed: 04/06/2019 5:12:33 PM By: Elliot Gurney, BSN, RN, CWS, Kim RN, BSN Entered By: Elliot Gurney, BSN, RN, CWS, Kim on 04/06/2019 14:48:46 Nina Perez ([id number]) -------------------------------------------------------------------------------- Pain Assessment Details Patient Name: Nina Perez Date of Service: 04/06/2019 2:15 PM Medical Record Number: [id number] Patient Account Number: [id number] Date of Birth/Sex: [birth year] (78 y.o. F) Treating RN: Arnette Norris Primary Care Sloane Palmer: Dale [place] Other Clinician: Referring Lynita Groseclose: Dale [place] Treating Brennyn Ortlieb/Extender: Linwood Dibbles, HOYT Weeks in Treatment: 5 Active Problems Location of Pain Severity and Description of Pain Patient Has Paino No Site Locations Pain Management and Medication Current Pain Management: Electronic Signature(s) Signed: 04/06/2019 4:07:24 PM By: Arnette Norris Entered By: Arnette Norris on 04/06/2019 14:22:56 Nina Perez ([id number]) -------------------------------------------------------------------------------- Patient/Caregiver Education Details Patient Name: Nina Perez Date of Service: 04/06/2019 2:15 PM Medical Record Number: [id number] Patient Account Number: [id number] Date of Birth/Gender: 1940-09-13 (78 y.o. F) Treating RN: Huel Coventry Primary Care Physician: Dale [place] Other Clinician: Referring Physician: Dale [place] Treating Physician/Extender: Linwood Dibbles,  HOYT Weeks in Treatment: 5 Education Assessment Education Provided To: Patient Education Topics Provided Venous: Handouts: Controlling Swelling with Compression Stockings Methods: Demonstration Responses: State content correctly Wound/Skin Impairment: Handouts: Caring for Your Ulcer Methods: Demonstration,  Explain/Verbal Responses: State content correctly Electronic Signature(s) Signed: 04/06/2019 5:12:33 PM By: Elliot Gurney, BSN, RN, CWS, Kim RN, BSN Entered By: Elliot Gurney, BSN, RN, CWS, Kim on 04/06/2019 14:53:37 Nina Perez ([id number]) -------------------------------------------------------------------------------- Wound Assessment Details Patient Name: Nina Perez Date of Service: 04/06/2019 2:15 PM Medical Record Number: [id number] Patient Account Number: [id number] Date of Birth/Sex: 08/03/1940 (78 y.o. F) Treating RN: Arnette Norris Primary Care Ezell Melikian: Dale [place] Other Clinician: Referring Wannetta Langland: Dale [place] Treating Hagop Mccollam/Extender: Linwood Dibbles, HOYT Weeks in Treatment: 5 Wound Status Wound Number: 1 Primary Diabetic Wound/Ulcer of the Lower Extremity Etiology: Wound Location: Left Foot - Lateral Wound Open Wounding Event: Gradually Appeared Status: Date Acquired: 01/17/2019 Comorbid Hypertension, Type II Diabetes, Scleroderma, Weeks Of Treatment: 5 History: Gout, Rheumatoid Arthritis Clustered Wound: No Photos Wound Measurements Length: (cm) 1.7 Width: (cm) 1 Depth: (cm) 0.6 Area: (cm) 1.335 Volume: (cm) 0.801 % Reduction in Area: -34.8% % Reduction in Volume: -304.5% Epithelialization: None Tunneling: No Undermining: No Wound Description Classification: Grade 1 Foul Odor Wound Margin: Flat and Intact Slough/Fib Exudate Amount: Medium Exudate Type: Serosanguineous Exudate Color: red, brown After Cleansing: No rino Yes Wound Bed Granulation Amount: Small (1-33%) Exposed Structure Granulation Quality: Red Fascia Exposed: No Necrotic Amount: Large (67-100%) Fat Layer (Subcutaneous Tissue) Exposed: Yes Necrotic Quality: Eschar, Adherent Slough Tendon Exposed: No Muscle Exposed: No Joint Exposed: No Bone Exposed: No Treatment Notes Nina Perez, Nina Perez ([id number]) Wound #1 (Left, Lateral Foot) Notes Santyl, abd, conform Electronic  Signature(s) Signed: 04/06/2019 4:07:24 PM By: Arnette Norris Entered By: Arnette Norris on 04/06/2019 14:28:06 Nina Perez ([id number]) -------------------------------------------------------------------------------- Vitals Details Patient Name: Nina Perez Date of Service: 04/06/2019 2:15 PM Medical Record Number: [id number] Patient Account Number: [id number] Date of Birth/Sex: 02/01/1941 (78 y.o. F) Treating RN: Arnette Norris Primary Care Rajni Holsworth: Dale [place] Other Clinician: Referring Marisal Swarey: Dale [place] Treating Lovena Kluck/Extender: Linwood Dibbles, HOYT Weeks in Treatment: 5 Vital Signs Time Taken: 14:22 Temperature (F): 98.4 Height (in): 66 Pulse (bpm): 70 Weight (lbs): 230 Respiratory Rate (breaths/min): 18 Body Mass Index (BMI): 37.1 Blood Pressure (mmHg): 168/88 Reference Range: 80 - 120 mg / dl Electronic Signature(s) Signed: 04/06/2019 4:07:24 PM By: Arnette Norris Entered By: Arnette Norris on 04/06/2019 14:25:26

## 2019-04-12 ENCOUNTER — Other Ambulatory Visit: Payer: Self-pay

## 2019-04-12 ENCOUNTER — Ambulatory Visit (INDEPENDENT_AMBULATORY_CARE_PROVIDER_SITE_OTHER): Payer: Medicare Other | Admitting: Cardiovascular Disease

## 2019-04-12 ENCOUNTER — Encounter: Payer: Self-pay | Admitting: Cardiovascular Disease

## 2019-04-12 VITALS — BP 122/78 | HR 64 | Temp 96.4°F | Ht 66.0 in | Wt 236.0 lb

## 2019-04-12 DIAGNOSIS — L97529 Non-pressure chronic ulcer of other part of left foot with unspecified severity: Secondary | ICD-10-CM | POA: Diagnosis not present

## 2019-04-12 DIAGNOSIS — S91302A Unspecified open wound, left foot, initial encounter: Secondary | ICD-10-CM

## 2019-04-12 NOTE — Assessment & Plan Note (Signed)
Nina Perez was referred to me by Dr. Dellia Nims for "critical limb ischemia".  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  She is for the most part wheelchair-bound because of disabling arthritis but walks with a cane while she is at home.  She really denies claudication.  She developed wound on the lateral aspect of her left foot approxi-3 months ago that has been slow to heal despite debridement.  She had Doppler studies performed in our office 03/30/2019 revealing a left ABI of 0.83 with a high-frequency signal in the mid left SFA.  It is unclear whether this is contributory or true true unrelated to her wound.

## 2019-04-12 NOTE — Progress Notes (Signed)
04/12/2019 Nina Perez   1941-02-17  [id number]  Primary Physician Einar Pheasant, MD Primary Cardiologist: Lorretta Harp MD Nina Perez, [place]  HPI:  Nina Perez is a 78 y.o. moderately overweight married Caucasian female mother of 2, grandmother of 2 grandchildren referred by Dr. Dellia Nims, wound care specialist, for peripheral vascular evaluation because of critical limb ischemia.  She is accompanied by her husband [name] today.  Her risk factors include treated hypertension hyperlipidemia.  Her father may have died of a myocardial infarction.  She is never had a heart attack or stroke.  She denies chest pain or shortness of breath.  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  She is minimally ambulatory walks with a cane at home and in with and gets around in a wheelchair otherwise.  She does not denies claudication.  She had a wound to the lateral aspect of her left foot for approximately 3 months.  She is unaware of how this developed.  She sees Dr. Dellia Nims at the wound care clinic who debrides her on a routine basis.  The wound has been slow to heal.  She had Dopplers performed in our office 03/30/2019 revealing a left ABI of 0.83 with a high-frequency signal in the mid left SFA.   Current Meds  Medication Sig  . allopurinol (ZYLOPRIM) 300 MG tablet Take 1 tablet by mouth once daily  . amLODipine (NORVASC) 10 MG tablet TAKE 1 TABLET BY MOUTH ONCE DAILY  . aspirin 81 MG tablet Take 81 mg by mouth daily. Reported on 07/24/2015  . diclofenac sodium (VOLTAREN) 1 % GEL Apply 4 g topically 4 (four) times daily.  . fluticasone (FLONASE) 50 MCG/ACT nasal spray Place 2 sprays into the nose daily.  . folic acid (FOLVITE) 1 MG tablet Take 1 mg by mouth daily.  Marland Kitchen glucose blood (BAYER CONTOUR TEST) test strip USE  STRIP TO CHECK GLUCOSE TWICE DAILY. Dx: E11.9  . lidocaine (LIDODERM) 5 % Place 1 patch onto the skin daily. Remove &  Discard patch within 12 hours.  Marland Kitchen losartan (COZAAR) 100 MG tablet Take 1 tablet by mouth once daily  . metFORMIN (GLUCOPHAGE) 500 MG tablet Take 1 tablet by mouth once daily  . methotrexate 2.5 MG tablet Take 6 tablets q week  . metoprolol succinate (TOPROL-XL) 50 MG 24 hr tablet TAKE 1 TABLET BY MOUTH ONCE DAILY IMMEDIATELY FOLLOWING A MEAL  . MICROLET LANCETS MISC Check sugar once daily, [employer] Microlet Lancets. Dx E11.9  . naproxen (NAPROSYN) 500 MG tablet Take 500 mg by mouth 2 (two) times daily as needed.  . nystatin cream (MYCOSTATIN) Apply to affected area bid  . omeprazole (PRILOSEC) 20 MG capsule Take 20 mg by mouth daily.  Marland Kitchen spironolactone (ALDACTONE) 25 MG tablet Take 1 tablet by mouth once daily  . traMADol (ULTRAM) 50 MG tablet Take 1 tablet (50 mg total) by mouth every 8 (eight) hours as needed.     Allergies  Allergen Reactions  . Maxzide [Triamterene-Hctz] Other (See Comments)    Weakness/fatigue     Social History   Socioeconomic History  . Marital status: Married    Spouse name: Not on file  . Number of children: Not on file  . Years of education: Not on file  . Highest education level: Not on file  Occupational History  . Not on file  Social Needs  . Financial resource strain: Not hard at all  .  Food insecurity    Worry: Never true    Inability: Never true  . Transportation needs    Medical: No    Non-medical: No  Tobacco Use  . Smoking status: Former Smoker    Packs/day: 0.50    Years: 15.00    Pack years: 7.50    Quit date: 05/12/1988    Years since quitting: 30.9  . Smokeless tobacco: Never Used  Substance and Sexual Activity  . Alcohol use: No    Alcohol/week: 0.0 standard drinks  . Drug use: No  . Sexual activity: Yes  Lifestyle  . Physical activity    Days per week: Not on file    Minutes per session: Not on file  . Stress: Only a little  Relationships  . Social Herbalist on phone: Not on file    Gets together: Not on file     Attends religious service: Not on file    Active member of club or organization: Not on file    Attends meetings of clubs or organizations: Not on file    Relationship status: Not on file  . Intimate partner violence    Fear of current or ex partner: No    Emotionally abused: No    Physically abused: No    Forced sexual activity: No  Other Topics Concern  . Not on file  Social History Narrative  . Not on file     Review of Systems: General: negative for chills, fever, night sweats or weight changes.  Cardiovascular: negative for chest pain, dyspnea on exertion, edema, orthopnea, palpitations, paroxysmal nocturnal dyspnea or shortness of breath Dermatological: negative for rash Respiratory: negative for cough or wheezing Urologic: negative for hematuria Abdominal: negative for nausea, vomiting, diarrhea, bright red blood per rectum, melena, or hematemesis Neurologic: negative for visual changes, syncope, or dizziness All other systems reviewed and are otherwise negative except as noted above.    Blood pressure 122/78, pulse 64, temperature (!) 96.4 F (35.8 C), height 5\' 6"  (1.676 m), weight 236 lb (107 kg), last menstrual period 05/25/1988.  General appearance: alert and no distress Neck: no adenopathy, no carotid bruit, no JVD, supple, symmetrical, trachea midline and thyroid not enlarged, symmetric, no tenderness/mass/nodules Lungs: clear to auscultation bilaterally Heart: regular rate and rhythm, S1, S2 normal, no murmur, click, rub or gallop Extremities: Ulcer lateral aspect left foot Pulses: Diminished left pedal pulse Skin: Left foot ulcer lateral aspect Neurologic: Alert and oriented X 3, normal strength and tone. Normal symmetric reflexes. Normal coordination and gait  EKG sinus rhythm nonspecific IVCD.  She had left axis deviation.  I personally reviewed this EKG.  ASSESSMENT AND PLAN:   Open wound of left foot Ms. Degolyer was referred to me by Dr. Dellia Nims for  "critical limb ischemia".  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  She is for the most part wheelchair-bound because of disabling arthritis but walks with a cane while she is at home.  She really denies claudication.  She developed wound on the lateral aspect of her left foot approxi-3 months ago that has been slow to heal despite debridement.  She had Doppler studies performed in our office 03/30/2019 revealing a left ABI of 0.83 with a high-frequency signal in the mid left SFA.  It is unclear whether this is contributory or true true unrelated to her wound.      Lorretta Harp MD FACP,FACC,FAHA, [facility] 04/12/2019 3:08 PM

## 2019-04-12 NOTE — Patient Instructions (Signed)
Medication Instructions:  Your physician recommends that you continue on your current medications as directed. Please refer to the Current Medication list given to you today.  If you need a refill on your cardiac medications before your next appointment, please call your pharmacy.   Lab work: NONE  Testing/Procedures: NONE  Follow-Up: At [facility], you and your health needs are our priority.  As part of our continuing mission to provide you with exceptional heart care, we have created designated Provider Care Teams.  These Care Teams include your primary Cardiologist (physician) and Advanced Practice Providers (APPs -  Physician Assistants and Nurse Practitioners) who all work together to provide you with the care you need, when you need it. You may see Dr Gwenlyn Found or one of the following Advanced Practice Providers on your designated Care Team:    Kerin Ransom, PA-C  [place], [place]  Coletta Memos, [place]  Your physician wants you to follow-up in: 3 months

## 2019-04-13 ENCOUNTER — Encounter: Payer: Medicare Other | Attending: Internal Medicine | Admitting: Internal Medicine

## 2019-04-13 DIAGNOSIS — M109 Gout, unspecified: Secondary | ICD-10-CM | POA: Diagnosis not present

## 2019-04-13 DIAGNOSIS — L97528 Non-pressure chronic ulcer of other part of left foot with other specified severity: Secondary | ICD-10-CM | POA: Diagnosis not present

## 2019-04-13 DIAGNOSIS — M059 Rheumatoid arthritis with rheumatoid factor, unspecified: Secondary | ICD-10-CM | POA: Diagnosis not present

## 2019-04-13 DIAGNOSIS — M341 CR(E)ST syndrome: Secondary | ICD-10-CM | POA: Diagnosis not present

## 2019-04-13 DIAGNOSIS — E11621 Type 2 diabetes mellitus with foot ulcer: Secondary | ICD-10-CM | POA: Diagnosis not present

## 2019-04-13 DIAGNOSIS — Z85828 Personal history of other malignant neoplasm of skin: Secondary | ICD-10-CM | POA: Insufficient documentation

## 2019-04-13 DIAGNOSIS — I272 Pulmonary hypertension, unspecified: Secondary | ICD-10-CM | POA: Diagnosis not present

## 2019-04-13 DIAGNOSIS — I872 Venous insufficiency (chronic) (peripheral): Secondary | ICD-10-CM | POA: Diagnosis not present

## 2019-04-13 NOTE — Progress Notes (Signed)
Nina Perez ([id number]) Visit Report for 04/13/2019 Arrival Information Details Patient Name: ROSLIN, Perez Date of Service: 04/13/2019 2:45 PM Medical Record Number: [id number] Patient Account Number: [id number] Date of Birth/Sex: Feb 05, 1941 (78 y.o. F) Treating RN: Montey Hora Primary Care Ross Hefferan: Einar Pheasant Other Clinician: Referring Yisel Megill: Einar Pheasant Treating Alasia Enge/Extender: Tito Dine in Treatment: 6 Visit Information History Since Last Visit Added or deleted any medications: No Patient Arrived: Wheel Chair Any new allergies or adverse reactions: No Arrival Time: 14:45 Had a fall or experienced change in No Accompanied By: husband activities of daily living that may affect Transfer Assistance: None risk of falls: Patient Identification Verified: Yes Signs or symptoms of abuse/neglect since last visito No Secondary Verification Process Completed: Yes Hospitalized since last visit: No Implantable device outside of the clinic excluding No cellular tissue based products placed in the center since last visit: Has Dressing in Place as Prescribed: Yes Pain Present Now: No Electronic Signature(s) Signed: 04/13/2019 4:13:23 PM By: Montey Hora Entered By: Montey Hora on 04/13/2019 14:45:30 Nina Perez ([id number]) -------------------------------------------------------------------------------- Encounter Discharge Information Details Patient Name: Nina Perez Date of Service: 04/13/2019 2:45 PM Medical Record Number: [id number] Patient Account Number: [id number] Date of Birth/Sex: November 29, 1940 (78 y.o. F) Treating RN: Harold Barban Primary Care Shareka Casale: Einar Pheasant Other Clinician: Referring Kalla Watson: Einar Pheasant Treating Brandalyn Harting/Extender: Tito Dine in Treatment: 6 Encounter Discharge Information Items Post Procedure Vitals Discharge Condition: Stable Temperature (F): 97.8 Ambulatory Status: Wheelchair Pulse  (bpm): 67 Discharge Destination: Home Respiratory Rate (breaths/min): 18 Transportation: Private Auto Blood Pressure (mmHg): 173/73 Accompanied By: husband Schedule Follow-up Appointment: Yes Clinical Summary of Care: Electronic Signature(s) Signed: 04/13/2019 4:10:47 PM By: Harold Barban Entered By: Harold Barban on 04/13/2019 15:36:11 Nina Perez ([id number]) -------------------------------------------------------------------------------- Lower Extremity Assessment Details Patient Name: Nina Perez Date of Service: 04/13/2019 2:45 PM Medical Record Number: [id number] Patient Account Number: [id number] Date of Birth/Sex: 05-May-1941 (78 y.o. F) Treating RN: Montey Hora Primary Care Glenn Gullickson: Einar Pheasant Other Clinician: Referring Castin Donaghue: Einar Pheasant Treating Kavita Bartl/Extender: Ricard Dillon Weeks in Treatment: 6 Edema Assessment Assessed: [Left: No] [Right: No] Edema: [Left: Ye] [Right: s] Vascular Assessment Pulses: Dorsalis Pedis Palpable: [Left:Yes] Electronic Signature(s) Signed: 04/13/2019 4:13:23 PM By: Montey Hora Entered By: Montey Hora on 04/13/2019 14:53:15 Nina Perez ([id number]) -------------------------------------------------------------------------------- Multi Wound Chart Details Patient Name: Nina Perez Date of Service: 04/13/2019 2:45 PM Medical Record Number: [id number] Patient Account Number: [id number] Date of Birth/Sex: 06-May-1941 (78 y.o. F) Treating RN: Harold Barban Primary Care Malikhi Ogan: Einar Pheasant Other Clinician: Referring Tanette Chauca: Einar Pheasant Treating Terressa Evola/Extender: Tito Dine in Treatment: 6 Vital Signs Height(in): 66 Pulse(bpm): 65 Weight(lbs): 230 Blood Pressure(mmHg): 175/73 Body Mass Index(BMI): 37 Temperature(F): 97.8 Respiratory Rate 16 (breaths/min): Photos: [N/A:N/A] Wound Location: Left Foot - Lateral N/A N/A Wounding Event: Gradually Appeared N/A N/A Primary  Etiology: Diabetic Wound/Ulcer of the N/A N/A Lower Extremity Comorbid History: Hypertension, Type II N/A N/A Diabetes, Scleroderma, Gout, Rheumatoid Arthritis Date Acquired: 01/17/2019 N/A N/A Weeks of Treatment: 6 N/A N/A Wound Status: Open N/A N/A Measurements L x W x D 1.8x0.9x0.6 N/A N/A (cm) Area (cm) : 1.272 N/A N/A Volume (cm) : 0.763 N/A N/A % Reduction in Area: -28.50% N/A N/A % Reduction in Volume: -285.40% N/A N/A Classification: Grade 1 N/A N/A Exudate Amount: Medium N/A N/A Exudate Type: Serosanguineous N/A N/A Exudate Color: red, brown N/A N/A Wound Margin: Flat and Intact N/A N/A Granulation Amount: Small (1-33%) N/A N/A Granulation  RN: Montey Hora Primary Care Cherry [place]: Einar Pheasant Other Clinician: Referring Yuette Putnam: Einar Pheasant Treating Kordel Leavy/Extender: Tito Dine in Treatment: 6 Active Problems Location of Pain Severity and Description of Pain Patient Has Paino Yes Site Locations Pain Location: Pain in Ulcers With Dressing Change: Yes Duration of the Pain. Constant / Intermittento Constant Pain Management and  Medication Current Pain Management: Electronic Signature(s) Signed: 04/13/2019 4:13:23 PM By: Montey Hora Entered By: Montey Hora on 04/13/2019 14:46:00 Nina Perez ([id number]) -------------------------------------------------------------------------------- Patient/Caregiver Education Details Patient Name: Nina Perez Date of Service: 04/13/2019 2:45 PM Medical Record Number: [id number] Patient Account Number: [id number] Date of Birth/Gender: 1940-08-30 (78 y.o. F) Treating RN: Harold Barban Primary Care Physician: Einar Pheasant Other Clinician: Referring Physician: Einar Pheasant Treating Physician/Extender: Tito Dine in Treatment: 6 Education Assessment Education Provided To: Patient Education Topics Provided Venous: Handouts: Controlling Swelling with Compression Stockings Methods: Demonstration, Explain/Verbal Responses: State content correctly Wound/Skin Impairment: Handouts: Caring for Your Ulcer Methods: Demonstration, Explain/Verbal Responses: State content correctly Electronic Signature(s) Signed: 04/13/2019 4:10:47 PM By: Harold Barban Entered By: Harold Barban on 04/13/2019 15:30:48 Nina Perez ([id number]) -------------------------------------------------------------------------------- Wound Assessment Details Patient Name: Nina Perez Date of Service: 04/13/2019 2:45 PM Medical Record Number: [id number] Patient Account Number: [id number] Date of Birth/Sex: [birth year] (78 y.o. F) Treating RN: Montey Hora Primary Care Holton Sidman: Einar Pheasant Other Clinician: Referring Cartier Mapel: Einar Pheasant Treating Delos Klich/Extender: Tito Dine in Treatment: 6 Wound Status Wound Number: 1 Primary Diabetic Wound/Ulcer of the Lower Extremity Etiology: Wound Location: Left Foot - Lateral Wound Open Wounding Event: Gradually Appeared Status: Date Acquired: 01/17/2019 Comorbid Hypertension, Type II Diabetes,  Scleroderma, Weeks Of Treatment: 6 History: Gout, Rheumatoid Arthritis Clustered Wound: No Photos Wound Measurements Length: (cm) 1.8 Width: (cm) 0.9 Depth: (cm) 0.6 Area: (cm) 1.272 Volume: (cm) 0.763 % Reduction in Area: -28.5% % Reduction in Volume: -285.4% Epithelialization: None Tunneling: No Undermining: No Wound Description Classification: Grade 1 Foul Odor Wound Margin: Flat and Intact Slough/Fi Exudate Amount: Medium Exudate Type: Serosanguineous Exudate Color: red, brown After Cleansing: No brino Yes Wound Bed Granulation Amount: Small (1-33%) Exposed Structure Granulation Quality: Red Fascia Exposed: No Necrotic Amount: Large (67-100%) Fat Layer (Subcutaneous Tissue) Exposed: Yes Necrotic Quality: Eschar, Adherent Slough Tendon Exposed: No Muscle Exposed: No Joint Exposed: No Bone Exposed: No Treatment Notes SHANDOLYN, CUNDARI ([id number]) Wound #1 (Left, Lateral Foot) Notes Santyl, abd, conform Electronic Signature(s) Signed: 04/13/2019 4:13:23 PM By: Montey Hora Entered By: Montey Hora on 04/13/2019 14:52:31 Nina Perez ([id number]) -------------------------------------------------------------------------------- Vitals Details Patient Name: Nina Perez Date of Service: 04/13/2019 2:45 PM Medical Record Number: [id number] Patient Account Number: [id number] Date of Birth/Sex: 08/22/1940 (78 y.o. F) Treating RN: Montey Hora Primary Care Jaala Bohle: Einar Pheasant Other Clinician: Referring Syniah Berne: Einar Pheasant Treating Sae Handrich/Extender: Tito Dine in Treatment: 6 Vital Signs Time Taken: 14:46 Temperature (F): 97.8 Height (in): 66 Pulse (bpm): 67 Weight (lbs): 230 Respiratory Rate (breaths/min): 16 Body Mass Index (BMI): 37.1 Blood Pressure (mmHg): 175/73 Reference Range: 80 - 120 mg / dl Electronic Signature(s) Signed: 04/13/2019 4:13:23 PM By: Montey Hora Entered By: Montey Hora on 04/13/2019 14:49:14  Nina Perez ([id number]) Visit Report for 04/13/2019 Arrival Information Details Patient Name: ROSLIN, Perez Date of Service: 04/13/2019 2:45 PM Medical Record Number: [id number] Patient Account Number: [id number] Date of Birth/Sex: Feb 05, 1941 (78 y.o. F) Treating RN: Montey Hora Primary Care Ross Hefferan: Einar Pheasant Other Clinician: Referring Yisel Megill: Einar Pheasant Treating Alasia Enge/Extender: Tito Dine in Treatment: 6 Visit Information History Since Last Visit Added or deleted any medications: No Patient Arrived: Wheel Chair Any new allergies or adverse reactions: No Arrival Time: 14:45 Had a fall or experienced change in No Accompanied By: husband activities of daily living that may affect Transfer Assistance: None risk of falls: Patient Identification Verified: Yes Signs or symptoms of abuse/neglect since last visito No Secondary Verification Process Completed: Yes Hospitalized since last visit: No Implantable device outside of the clinic excluding No cellular tissue based products placed in the center since last visit: Has Dressing in Place as Prescribed: Yes Pain Present Now: No Electronic Signature(s) Signed: 04/13/2019 4:13:23 PM By: Montey Hora Entered By: Montey Hora on 04/13/2019 14:45:30 Nina Perez ([id number]) -------------------------------------------------------------------------------- Encounter Discharge Information Details Patient Name: Nina Perez Date of Service: 04/13/2019 2:45 PM Medical Record Number: [id number] Patient Account Number: [id number] Date of Birth/Sex: November 29, 1940 (78 y.o. F) Treating RN: Harold Barban Primary Care Shareka Casale: Einar Pheasant Other Clinician: Referring Kalla Watson: Einar Pheasant Treating Brandalyn Harting/Extender: Tito Dine in Treatment: 6 Encounter Discharge Information Items Post Procedure Vitals Discharge Condition: Stable Temperature (F): 97.8 Ambulatory Status: Wheelchair Pulse  (bpm): 67 Discharge Destination: Home Respiratory Rate (breaths/min): 18 Transportation: Private Auto Blood Pressure (mmHg): 173/73 Accompanied By: husband Schedule Follow-up Appointment: Yes Clinical Summary of Care: Electronic Signature(s) Signed: 04/13/2019 4:10:47 PM By: Harold Barban Entered By: Harold Barban on 04/13/2019 15:36:11 Nina Perez ([id number]) -------------------------------------------------------------------------------- Lower Extremity Assessment Details Patient Name: Nina Perez Date of Service: 04/13/2019 2:45 PM Medical Record Number: [id number] Patient Account Number: [id number] Date of Birth/Sex: 05-May-1941 (78 y.o. F) Treating RN: Montey Hora Primary Care Glenn Gullickson: Einar Pheasant Other Clinician: Referring Castin Donaghue: Einar Pheasant Treating Kavita Bartl/Extender: Ricard Dillon Weeks in Treatment: 6 Edema Assessment Assessed: [Left: No] [Right: No] Edema: [Left: Ye] [Right: s] Vascular Assessment Pulses: Dorsalis Pedis Palpable: [Left:Yes] Electronic Signature(s) Signed: 04/13/2019 4:13:23 PM By: Montey Hora Entered By: Montey Hora on 04/13/2019 14:53:15 Nina Perez ([id number]) -------------------------------------------------------------------------------- Multi Wound Chart Details Patient Name: Nina Perez Date of Service: 04/13/2019 2:45 PM Medical Record Number: [id number] Patient Account Number: [id number] Date of Birth/Sex: 06-May-1941 (78 y.o. F) Treating RN: Harold Barban Primary Care Malikhi Ogan: Einar Pheasant Other Clinician: Referring Tanette Chauca: Einar Pheasant Treating Terressa Evola/Extender: Tito Dine in Treatment: 6 Vital Signs Height(in): 66 Pulse(bpm): 65 Weight(lbs): 230 Blood Pressure(mmHg): 175/73 Body Mass Index(BMI): 37 Temperature(F): 97.8 Respiratory Rate 16 (breaths/min): Photos: [N/A:N/A] Wound Location: Left Foot - Lateral N/A N/A Wounding Event: Gradually Appeared N/A N/A Primary  Etiology: Diabetic Wound/Ulcer of the N/A N/A Lower Extremity Comorbid History: Hypertension, Type II N/A N/A Diabetes, Scleroderma, Gout, Rheumatoid Arthritis Date Acquired: 01/17/2019 N/A N/A Weeks of Treatment: 6 N/A N/A Wound Status: Open N/A N/A Measurements L x W x D 1.8x0.9x0.6 N/A N/A (cm) Area (cm) : 1.272 N/A N/A Volume (cm) : 0.763 N/A N/A % Reduction in Area: -28.50% N/A N/A % Reduction in Volume: -285.40% N/A N/A Classification: Grade 1 N/A N/A Exudate Amount: Medium N/A N/A Exudate Type: Serosanguineous N/A N/A Exudate Color: red, brown N/A N/A Wound Margin: Flat and Intact N/A N/A Granulation Amount: Small (1-33%) N/A N/A Granulation

## 2019-04-14 NOTE — Progress Notes (Signed)
LACYE, WEICHMAN ([id number]) Visit Report for 04/13/2019 Debridement Details Patient Name: Nina, Perez Date of Service: 04/13/2019 2:45 PM Medical Record Number: [id number] Patient Account Number: [id number] Date of Birth/Sex: 04/14/1941 (78 y.o. F) Treating RN: Harold Barban Primary Care Provider: Einar Pheasant Other Clinician: Referring Provider: Einar Pheasant Treating Provider/Extender: Tito Dine in Treatment: 6 Debridement Performed for Wound #1 Left,Lateral Foot Assessment: Performed By: Physician Ricard Dillon, MD Debridement Type: Debridement Severity of Tissue Pre Fat layer exposed Debridement: Level of Consciousness (Pre- Awake and Alert procedure): Pre-procedure Verification/Time Yes - 15:32 Out Taken: Start Time: 15:32 Pain Control: Lidocaine Total Area Debrided (L x W): 1.8 (cm) x 0.9 (cm) = 1.62 (cm) Tissue and other material Non-Viable, Slough, Slough debrided: Level: Non-Viable Tissue Debridement Description: Selective/Open Wound Instrument: Curette Bleeding: Minimum Hemostasis Achieved: Pressure End Time: 15:33 Procedural Pain: 0 Post Procedural Pain: 0 Response to Treatment: Procedure was tolerated well Level of Consciousness Awake and Alert (Post-procedure): Post Debridement Measurements of Total Wound Length: (cm) 1.8 Width: (cm) 9 Depth: (cm) 0.6 Volume: (cm) 7.634 Character of Wound/Ulcer Post Debridement: Improved Severity of Tissue Post Debridement: Fat layer exposed Post Procedure Diagnosis Same as Pre-procedure Electronic Signature(s) Signed: 04/13/2019 4:48:24 PM By: Linton Ham MD Signed: 04/14/2019 4:50:58 PM By: Harold Barban Previous Signature: 04/13/2019 4:10:47 PM Version By: Stormy Card ([id number]) Entered By: Linton Ham on 04/13/2019 16:34:34 Nina Perez ([id number]) -------------------------------------------------------------------------------- HPI Details Patient  Name: Nina Perez Date of Service: 04/13/2019 2:45 PM Medical Record Number: [id number] Patient Account Number: [id number] Date of Birth/Sex: [birth year] (78 y.o. F) Treating RN: Harold Barban Primary Care Provider: Einar Pheasant Other Clinician: Referring Provider: Einar Pheasant Treating Provider/Extender: Tito Dine in Treatment: 6 History of Present Illness HPI Description: ADMISSION 03/02/2019 Nina Perez is a 78 year old woman with a complicated past medical history who presents for review of wound on the left lateral foot for the last 6 weeks. She states she simply looked down and saw a black spot on the left lateral foot and thought this was dirt went to wipe it off and there was an open wound. This is not gotten any better and over the timeframe. She has been using topical triple antibiotic ointment. She has a history of chronic venous insufficiency and has had wounds on her lower legs related to this but never on her feet. She is known to Dr. Aleda Grana at [facility] in [place] and she went to see him about this. He did not order any specific tests and said that he was not exactly sure what it because this per the patient's description. She was put in an [employer] but it looked as though this was stopped on 10/8. She had a week's worth of doxycycline as well. Patient states the area is very painful rating it at a 10 out of 10. She does not really describe claudication element. 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 X-ray we ordered last week was negative for any bony abnormality The patient had venous studies on 06/16/2018 in the [facility] system. There was no thrombosis and they did not comment on reflux. She has had previous vein surgery ABIs in this clinic were 0.96  on the right and 0.88 on the left 10/28; the patient arrives with a small punched out area on the left  lateral foot. This appears somewhat better than last week but still with a nonviable surface. I did not feel when I admitted her last week that she had significant arterial disease. She definitely has chronic venous insufficiency and multiple other rheumatologic issues including rheumatoid arthritis, scleroderma crest syndrome but the exact etiology of this wound is not really clear in its location. We used Santyl 11/4; small punched out area on the left anterior lateral foot. The exact reason for this wound is not really clear. She has venous disease however I am not certain that this explains the presentation like this. She saw Dr. Aleda Grana of [facility] before she came into this clinic. She does have other possible alternatives for this wound. I do not believe she has an arterial issue however she does have rheumatoid arthritis, crest syndrome etc. I have been concerned to actually biopsy this however unless of course it worsens 11/11; small punched out area on the left anterior lateral foot. The cause of this is never really clear. She has venous disease previous venous stripping and was followed by Dr. Aleda Grana of [facility]. She saw Dr. Ozella Almond office in 2018 once. She does not want to go back there. She was not felt to have a DVT or evidence of reflux at that time. Although it was difficult to sort out I really do not believe this lady has had formal arterial studies. The area on the left lateral foot still has debris requiring debridement. This is disappointing she has been using Santyl. 11/18; small punched out area on the left lateral foot. Using Santyl and certainly the adherent debris at the base of this is a lot better. She is going for arterial studies later today at Dr. Gwenlyn Found and Dr. Cyndi Lennert office in [place]. If these show adequate blood flow I think we  need to reach out to her rheumatologist about discontinuing the methotrexate. The cause of this wound has never really been clear to me. She does however have a complicated rheumatologic history [see above] 04/06/2019 on evaluation today patient appears to be doing well at this point with regard to her wound. The size is really about the same as previously noted. With that being said this is the first time I seen her but it appears that she has a lot more slough buildup than she did last week when collagen was used. This is something that the patient's case manager nurse also stated to me as well. I really feel like the Santyl likely is going to do better for her. She does have an appointment with Dr. Naida Sleight office on December 1. Nina, Perez ([id number]) 12/2; changed back to sample last week. I looked at the picture and she had a large amount of necrotic debris in the wound surface and slough I certainly agree with this. The wound actually looks better today but still required debridement I took a call from Dr. Gwenlyn Found yesterday. He felt that the patient had a 80% left SFA stenosis although he was not at all sure that the stenosis was the cause of the patient's wound where that the nonhealing state was related to ischemia. We agreed that we would attempt ongoing wound care and that if the wound stalled deteriorated we would consider further interventions including angiography. I should note that the patient pointed out to me today that the only time this really bothers her is when she is lying in bed and the pain is relieved by putting her leg  over the edge of the bed. Unfortunately if she did not seem to tell Dr. Gwenlyn Found this Electronic Signature(s) Signed: 04/13/2019 4:48:24 PM By: Linton Ham MD Entered By: Linton Ham on 04/13/2019 16:37:56 Nina Perez ([id number]) -------------------------------------------------------------------------------- Physical Exam Details Patient Name:  Nina Perez Date of Service: 04/13/2019 2:45 PM Medical Record Number: [id number] Patient Account Number: [id number] Date of Birth/Sex: 04/16/1941 (78 y.o. F) Treating RN: Harold Barban Primary Care Provider: Einar Pheasant Other Clinician: Referring Provider: Einar Pheasant Treating Provider/Extender: Tito Dine in Treatment: 6 Cardiovascular Popliteal and femoral pulses are palpable. She has a faint dorsalis pedis and posterior tibial artery pulse. Integumentary (Hair, Skin) No erythema around the wound. Notes Wound exam; the patient's wound bed looks better than last week although there is still fibrinous debris and slough at the base of this with I removed with a #3 curette post debridement this looks really very healthy although the wound is certainly not improving in terms of wound volume. There is no erythema around the wound Electronic Signature(s) Signed: 04/13/2019 4:48:24 PM By: Linton Ham MD Entered By: Linton Ham on 04/13/2019 16:39:51 Nina Perez ([id number]) -------------------------------------------------------------------------------- Physician Orders Details Patient Name: Nina Perez Date of Service: 04/13/2019 2:45 PM Medical Record Number: [id number] Patient Account Number: [id number] Date of Birth/Sex: March 28, 1941 (78 y.o. F) Treating RN: Harold Barban Primary Care Provider: Einar Pheasant Other Clinician: Referring Provider: Einar Pheasant Treating Provider/Extender: Tito Dine in Treatment: 6 Verbal / Phone Orders: No Diagnosis Coding Wound Cleansing Wound #1 Left,Lateral Foot o Clean wound with Normal Saline. o May Shower, gently pat wound dry prior to applying new dressing. Anesthetic (add to Medication List) Wound #1 Left,Lateral Foot o Topical Lidocaine 4% cream applied to wound bed prior to debridement (In Clinic Only). Primary Wound Dressing Wound #1 Left,Lateral Foot o Santyl  Ointment Secondary Dressing Wound #1 Left,Lateral Foot o ABD and Kerlix/Conform Dressing Change Frequency Wound #1 Left,Lateral Foot o Change dressing every day. Follow-up Appointments Wound #1 Left,Lateral Foot o Return Appointment in 1 week. Edema Control Wound #1 Left,Lateral Foot o Patient to wear own compression stockings o Elevate legs to the level of the heart and pump ankles as often as possible Off-Loading Wound #1 Left,Lateral Foot o Other: - keep pressure off of wound area Additional Orders / Instructions Wound #1 Left,Lateral Foot o Activity as tolerated Medications-please add to medication list. Wound #1 Left,Lateral Foot DASYA, ISTVAN ([id number]) o Santyl Enzymatic Ointment Electronic Signature(s) Signed: 04/13/2019 4:10:47 PM By: Harold Barban Signed: 04/13/2019 4:48:24 PM By: Linton Ham MD Entered By: Harold Barban on 04/13/2019 15:34:43 Nina Perez ([id number]) -------------------------------------------------------------------------------- Problem List Details Patient Name: Nina Perez Date of Service: 04/13/2019 2:45 PM Medical Record Number: [id number] Patient Account Number: [id number] Date of Birth/Sex: 07/02/1940 (78 y.o. F) Treating RN: Harold Barban Primary Care Provider: Einar Pheasant Other Clinician: Referring Provider: Einar Pheasant Treating Provider/Extender: Tito Dine in Treatment: 6 Active Problems ICD-10 Evaluated Encounter Code Description Active Date Today Diagnosis E11.621 Type 2 diabetes mellitus with foot ulcer 03/02/2019 No Yes I87.323 Chronic venous hypertension (idiopathic) with inflammation of 03/02/2019 No Yes bilateral lower extremity L97.528 Non-pressure chronic ulcer of other part of left foot with other 03/02/2019 No Yes specified severity M05.7A Rheumatoid arthritis with rheumatoid factor of other specified 03/02/2019 No Yes site without organ or systems  involvement Inactive Problems Resolved Problems Electronic Signature(s) Signed: 04/13/2019 4:48:24 PM By: Linton Ham MD Entered By: Linton Ham on 04/13/2019 [place],  EMMA-JANE VELAZQUES ([id number]) -------------------------------------------------------------------------------- Progress Note Details Patient Name: Nina Perez, Nina Perez Date of Service: 04/13/2019 2:45 PM Medical Record Number: [id number] Patient Account Number: [id number] Date of Birth/Sex: 1941-04-30 (78 y.o. F) Treating RN: Harold Barban Primary Care Provider: Einar Pheasant Other Clinician: Referring Provider: Einar Pheasant Treating Provider/Extender: Tito Dine in Treatment: 6 Subjective History of Present Illness (HPI) ADMISSION 03/02/2019 Mrs. Kenkel is a 78 year old woman with a complicated past medical history who presents for review of wound on the left lateral foot for the last 6 weeks. She states she simply looked down and saw a black spot on the left lateral foot and thought this was dirt went to wipe it off and there was an open wound. This is not gotten any better and over the timeframe. She has been using topical triple antibiotic ointment. She has a history of chronic venous insufficiency and has had wounds on her lower legs related to this but never on her feet. She is known to Dr. Aleda Grana at [facility] in [place] and she went to see him about this. He did not order any specific tests and said that he was not exactly sure what it because this per the patient's description. She was put in an [employer] but it looked as though this was stopped on 10/8. She had a week's worth of doxycycline as well. Patient states the area is very painful rating it at a 10 out of 10. She does not really describe claudication element. Past medical history; scleroderma Crest syndromes, pulmonary hypertension, rheumatoid arthritis, chronic hypoxemic respiratory failure, type 2 diabetes on  oral agents hemoglobin A1c at 6.3, she has a history of skin cancer. X-ray we ordered last week was negative for any bony abnormality The patient had venous studies on 06/16/2018 in the [facility] system. There was no thrombosis and they did not comment on reflux. She has had previous vein surgery ABIs in this clinic were 0.96 on the right and 0.88 on the left 10/28; the patient arrives with a small punched out area on the left lateral foot. This appears somewhat better than last week but still with a nonviable surface. I did not feel when I admitted her last week that she had significant arterial disease. She definitely has chronic venous insufficiency and multiple other rheumatologic issues including rheumatoid arthritis, scleroderma crest syndrome but the exact etiology of this wound is not really clear in its location. We used Santyl 11/4; small punched out area on the left anterior lateral foot. The exact reason for this wound is not really clear. She has venous disease however I am not certain that this explains the presentation like this. She saw Dr. Aleda Grana of [facility] before she came into this clinic. She does have other possible alternatives for this wound. I do not believe she has an arterial issue however she does have rheumatoid arthritis, crest syndrome etc. I have been concerned to actually biopsy this however unless of course it worsens 11/11; small punched out area on the left anterior lateral foot. The cause of this is never really clear. She has venous disease previous venous stripping and was followed by Dr. Aleda Grana of [facility]. She saw Dr. Ozella Almond office in 2018 once. She does not want to go back there. She was not felt to have a DVT or evidence of reflux at that time. Although it was difficult to sort out I really do not believe this lady has had formal arterial  studies. The area on the left lateral foot still has debris requiring debridement. This is  disappointing she has been using Santyl. 11/18; small punched out area on the left lateral foot. Using Santyl and certainly the adherent debris at the base of this is a lot better. She is going for arterial studies later today at Dr. Gwenlyn Found and Dr. Cyndi Lennert office in [place]. If these show adequate blood flow I think we need to reach out to her rheumatologist about discontinuing the methotrexate. The cause of this wound has never really been clear to me. She does however have a complicated rheumatologic history [see above] 04/06/2019 on evaluation today patient appears to be doing well at this point with regard to her wound. The size is really about the same as previously noted. With that being said this is the first time I seen her but it appears that she has a lot more slough buildup than she did last week when collagen was used. This is something that the patient's case manager nurse also stated to me as well. I really feel like the Santyl likely is going to do better for her. She does have an appointment with Dr. Edison Pace, Nadara Mode ([id number]) [facility] on December 1. 12/2; changed back to sample last week. I looked at the picture and she had a large amount of necrotic debris in the wound surface and slough I certainly agree with this. The wound actually looks better today but still required debridement I took a call from Dr. Gwenlyn Found yesterday. He felt that the patient had a 80% left SFA stenosis although he was not at all sure that the stenosis was the cause of the patient's wound where that the nonhealing state was related to ischemia. We agreed that we would attempt ongoing wound care and that if the wound stalled deteriorated we would consider further interventions including angiography. I should note that the patient pointed out to me today that the only time this really bothers her is when she is lying in bed and the pain is relieved by putting her leg over the edge of the bed.  Unfortunately if she did not seem to tell Dr. Gwenlyn Found this Objective Constitutional Vitals Time Taken: 2:46 PM, Height: 66 in, Weight: 230 lbs, BMI: 37.1, Temperature: 97.8 F, Pulse: 67 bpm, Respiratory Rate: 16 breaths/min, Blood Pressure: 175/73 mmHg. Cardiovascular Popliteal and femoral pulses are palpable. She has a faint dorsalis pedis and posterior tibial artery pulse. General Notes: Wound exam; the patient's wound bed looks better than last week although there is still fibrinous debris and slough at the base of this with I removed with a #3 curette post debridement this looks really very healthy although the wound is certainly not improving in terms of wound volume. There is no erythema around the wound Integumentary (Hair, Skin) No erythema around the wound. Wound #1 status is Open. Original cause of wound was Gradually Appeared. The wound is located on the Left,Lateral Foot. The wound measures 1.8cm length x 0.9cm width x 0.6cm depth; 1.272cm^2 area and 0.763cm^3 volume. There is Fat Layer (Subcutaneous Tissue) Exposed exposed. There is no tunneling or undermining noted. There is a medium amount of serosanguineous drainage noted. The wound margin is flat and intact. There is small (1-33%) red granulation within the wound bed. There is a large (67-100%) amount of necrotic tissue within the wound bed including Eschar and Adherent Slough. Assessment Active Problems ICD-10 Type 2 diabetes mellitus with foot ulcer Chronic venous hypertension (idiopathic) with  inflammation of bilateral lower extremity Non-pressure chronic ulcer of other part of left foot with other specified severity Rheumatoid arthritis with rheumatoid factor of other specified site without organ or systems involvement Nina, Perez. ([id number]) Procedures Wound #1 Pre-procedure diagnosis of Wound #1 is a Diabetic Wound/Ulcer of the Lower Extremity located on the Left,Lateral Foot .Severity of Tissue Pre Debridement  is: Fat layer exposed. There was a Selective/Open Wound Non-Viable Tissue Debridement with a total area of 1.62 sq cm performed by Ricard Dillon, MD. With the following instrument(s): Curette to remove Non-Viable tissue/material. Material removed includes [facility] after achieving pain control using Lidocaine. No specimens were taken. A time out was conducted at 15:32, prior to the start of the procedure. A Minimum amount of bleeding was controlled with Pressure. The procedure was tolerated well with a pain level of 0 throughout and a pain level of 0 following the procedure. Post Debridement Measurements: 1.8cm length x 9cm width x 0.6cm depth; 7.634cm^3 volume. Character of Wound/Ulcer Post Debridement is improved. Severity of Tissue Post Debridement is: Fat layer exposed. Post procedure Diagnosis Wound #1: Same as Pre-Procedure Plan Wound Cleansing: Wound #1 Left,Lateral Foot: Clean wound with Normal Saline. May Shower, gently pat wound dry prior to applying new dressing. Anesthetic (add to Medication List): Wound #1 Left,Lateral Foot: Topical Lidocaine 4% cream applied to wound bed prior to debridement (In Clinic Only). Primary Wound Dressing: Wound #1 Left,Lateral Foot: Santyl Ointment Secondary Dressing: Wound #1 Left,Lateral Foot: ABD and Kerlix/Conform Dressing Change Frequency: Wound #1 Left,Lateral Foot: Change dressing every day. Follow-up Appointments: Wound #1 Left,Lateral Foot: Return Appointment in 1 week. Edema Control: Wound #1 Left,Lateral Foot: Patient to wear own compression stockings Elevate legs to the level of the heart and pump ankles as often as possible Off-Loading: Wound #1 Left,Lateral Foot: Other: - keep pressure off of wound area Additional Orders / Instructions: Wound #1 Left,Lateral Foot: Activity as tolerated Medications-please add to medication list.: Wound #1 Left,Lateral Foot: Santyl Enzymatic Ointment Perez, Nina T. ([id number]) 1. I  certainly agree with putting her back on Santyl. Collagen did not seem to work for the 1 week she had this starting 2 weeks ago. 2. It is difficult to know what to think about her arterial status. Patient's to complain of what sounds like claudication at night that is relieved by putting their leg down in my experience certainly this is a suggestive feature of ischemic difficulty. 3. I will see if I can phone Dr. Precious Reel who is a rheumatologist at [facility] clinic to see if he would sanction at least a month off her methotrexate to see if we can stimulate some wound healing. 4. If she has better in terms of wound surface I might consider an alternative dressing next week Electronic Signature(s) Signed: 04/13/2019 4:48:24 PM By: Linton Ham MD Entered By: Linton Ham on 04/13/2019 16:43:12 Nina Perez ([id number]) -------------------------------------------------------------------------------- [place] Details Patient Name: Nina Perez Date of Service: 04/13/2019 Medical Record Number: [id number] Patient Account Number: [id number] Date of Birth/Sex: [birth year] (78 y.o. F) Treating RN: Harold Barban Primary Care Provider: Einar Pheasant Other Clinician: Referring Provider: Einar Pheasant Treating Provider/Extender: Tito Dine in Treatment: 6 Diagnosis Coding ICD-10 Codes Code Description E11.621 Type 2 diabetes mellitus with foot ulcer I87.323 Chronic venous hypertension (idiopathic) with inflammation of bilateral lower extremity L97.528 Non-pressure chronic ulcer of other part of left foot with other specified severity M05.7A Rheumatoid arthritis with rheumatoid factor of other specified site without organ or systems  involvement Facility Procedures CPT4 Code Description: [id number] - DEBRIDE WOUND 1ST 20 SQ CM OR < ICD-10 Diagnosis Description L97.528 Non-pressure chronic ulcer of other part of left foot with other s Modifier: pecified  sever Quantity: 1 ity Physician Procedures CPT4 Code Description: [id number] - WC PHYS DEBR WO ANESTH 20 SQ CM ICD-10 Diagnosis Description L97.528 Non-pressure chronic ulcer of other part of left foot with other s Modifier: pecified severi Quantity: 1 ty Electronic Signature(s) Signed: 04/13/2019 4:48:24 PM By: Linton Ham MD Entered By: Linton Ham on 04/13/2019 16:43:32

## 2019-04-20 ENCOUNTER — Other Ambulatory Visit: Payer: Self-pay

## 2019-04-20 ENCOUNTER — Encounter: Payer: Medicare Other | Admitting: Internal Medicine

## 2019-04-20 DIAGNOSIS — E11621 Type 2 diabetes mellitus with foot ulcer: Secondary | ICD-10-CM | POA: Diagnosis not present

## 2019-04-21 NOTE — Progress Notes (Signed)
N/A Exposed Structures: Fat Layer (Subcutaneous N/A N/A Tissue) Exposed: Yes Fascia: No Tendon: No Nina Perez, Nina Perez ([id number]) Muscle: No Joint: No Bone: No Epithelialization: None N/A N/A Debridement: Debridement - Excisional N/A N/A Pre-procedure 15:35 N/A N/A Verification/Time Out Taken: Pain Control: Lidocaine N/A N/A Tissue Debrided: Subcutaneous, Slough N/A N/A Level: Skin/Subcutaneous Tissue N/A N/A Debridement Area (sq cm): 1.12 N/A N/A Instrument: Curette N/A N/A Bleeding: Minimum N/A N/A Hemostasis Achieved: Pressure N/A N/A Debridement Treatment Procedure was tolerated well N/A N/A Response: Post Debridement 1.6x0.7x0.5 N/A N/A Measurements L x W x D (cm) Post Debridement Volume: 0.44 N/A N/A (cm) Procedures Performed: Debridement N/A N/A Treatment Notes Wound #1 (Left, Lateral Foot) Notes Santyl, abd, conform Electronic Signature(s) Signed: 04/20/2019 6:09:55 PM By: Linton Ham MD Entered By: Linton Ham on 04/20/2019 17:25:26 Nina Perez ([id number]) -------------------------------------------------------------------------------- Multi-Disciplinary Care Plan Details Patient Name: Nina Perez Date of Service: 04/20/2019 2:45 PM Medical Record Number: [id number] Patient Account Number: [id number] Date of Birth/Sex: Aug 15, 1940 (78 y.o.  F) Treating RN: Cornell Barman Primary Care Estell Dillinger: Einar Pheasant Other Clinician: Referring Shalena Ezzell: Einar Pheasant Treating Coraleigh Sheeran/Extender: Tito Dine in Treatment: 7 Active Inactive Abuse / Safety / Falls / Self Care Management Nursing Diagnoses: Impaired physical mobility Potential for falls Goals: Patient will remain injury free related to falls Date Initiated: 03/02/2019 Target Resolution Date: 03/16/2019 Goal Status: Active Interventions: Assess fall risk on admission and as needed Notes: Medication Nursing Diagnoses: Knowledge deficit related to medication safety: actual or potential Goals: Patient/caregiver will demonstrate understanding of all current medications Date Initiated: 03/02/2019 Target Resolution Date: 03/17/2019 Goal Status: Active Interventions: Assess for medication contraindications each visit where new medications are prescribed Notes: Necrotic Tissue Nursing Diagnoses: Impaired tissue integrity related to necrotic/devitalized tissue Goals: Necrotic/devitalized tissue will be minimized in the wound bed Date Initiated: 03/02/2019 Target Resolution Date: 03/17/2019 Goal Status: Active Interventions: Nina Perez, Nina Perez ([id number]) Assess patient pain level pre-, during and post procedure and prior to discharge Treatment Activities: Apply topical anesthetic as ordered : 03/02/2019 Notes: Orientation to the Wound Care Program Nursing Diagnoses: Knowledge deficit related to the wound healing center program Goals: Patient/caregiver will verbalize understanding of the [facility] Date Initiated: 03/02/2019 Target Resolution Date: 03/17/2019 Goal Status: Active Interventions: Provide education on orientation to the wound center Notes: Venous Leg Ulcer Nursing Diagnoses: Actual venous Insuffiency (use after diagnosis is confirmed) Goals: Patient/caregiver will verbalize understanding of disease process and disease  management Date Initiated: 03/02/2019 Target Resolution Date: 03/17/2019 Goal Status: Active Verify adequate tissue perfusion prior to therapeutic compression application Date Initiated: 03/02/2019 Target Resolution Date: 03/17/2019 Goal Status: Active Interventions: Assess peripheral edema status every visit. Provide education on venous insufficiency Notes: Wound/Skin Impairment Nursing Diagnoses: Impaired tissue integrity Goals: Ulcer/skin breakdown will have a volume reduction of 30% by week 4 Date Initiated: 03/02/2019 Target Resolution Date: 04/02/2019 Goal Status: Active Interventions: Assess ulceration(s) every visit Nina Perez, Nina Perez ([id number]) Treatment Activities: Topical wound management initiated : 03/02/2019 Notes: Electronic Signature(s) Signed: 04/20/2019 5:38:26 PM By: Gretta Cool, BSN, RN, CWS, Kim RN, BSN Entered By: Gretta Cool, BSN, RN, CWS, Kim on 04/20/2019 15:36:56 Nina Perez ([id number]) -------------------------------------------------------------------------------- Pain Assessment Details Patient Name: Nina Perez Date of Service: 04/20/2019 2:45 PM Medical Record Number: [id number] Patient Account Number: [id number] Date of Birth/Sex: 11-08-1940 (78 y.o. F) Treating RN: Army Melia Primary Care Latressa Harries: Einar Pheasant Other Clinician: Referring Rai Severns: Einar Pheasant Treating Jamarquis Crull/Extender: Tito Dine in Treatment: 7 Active Problems Location of Pain  Severity and Description of Pain Patient Has Paino Yes Site Locations Pain Location: Pain in Ulcers Rate the pain. Current Pain Level: 10 Pain Management and Medication Current Pain Management: Electronic Signature(s) Signed: 04/20/2019 4:08:03 PM By: Army Melia Entered By: Army Melia on 04/20/2019 14:59:50 Nina Perez ([id number]) -------------------------------------------------------------------------------- Patient/Caregiver Education Details Patient Name: Nina Perez Date of Service: 04/20/2019 2:45 PM Medical Record Number: [id number] Patient Account Number: [id number] Date of Birth/Gender: [birth year] (78 y.o. F) Treating RN: Cornell Barman Primary Care Physician: Einar Pheasant Other Clinician: Referring Physician: Einar Pheasant Treating Physician/Extender: Tito Dine in Treatment: 7 Education Assessment Education Provided To: Patient Education Topics Provided Wound/Skin Impairment: Handouts: Caring for Your Ulcer Methods: Demonstration, Explain/Verbal Responses: State content correctly Electronic Signature(s) Signed: 04/20/2019 5:38:26 PM By: Gretta Cool, BSN, RN, CWS, Kim RN, BSN Entered By: Gretta Cool, BSN, RN, CWS, Kim on 04/20/2019 15:41:24 Nina Perez ([id number]) -------------------------------------------------------------------------------- Wound Assessment Details Patient Name: Nina Perez Date of Service: 04/20/2019 2:45 PM Medical Record Number: [id number] Patient Account Number: [id number] Date of Birth/Sex: Feb 10, 1941 (78 y.o. F) Treating RN: Army Melia Primary Care Margretta Zamorano: Einar Pheasant Other Clinician: Referring Sarabella Caprio: Einar Pheasant Treating Burton Gahan/Extender: Tito Dine in Treatment: 7 Wound Status Wound Number: 1 Primary Diabetic Wound/Ulcer of the Lower Extremity Etiology: Wound Location: Left Foot - Lateral Wound Open Wounding Event: Gradually Appeared Status: Date Acquired: 01/17/2019 Comorbid Hypertension, Type II Diabetes, Scleroderma, Weeks Of Treatment: 7 History: Gout, Rheumatoid Arthritis Clustered Wound: No Photos Wound Measurements Length: (cm) 1.6 Width: (cm) 0.7 Depth: (cm) 0.5 Area: (cm) 0.88 Volume: (cm) 0.44 % Reduction in Area: 11.1% % Reduction in Volume: -122.2% Epithelialization: None Tunneling: No Undermining: No Wound Description Classification: Grade 1 Foul Odor Wound Margin: Flat and Intact Slough/Fi Exudate Amount: Medium Exudate Type:  Serosanguineous Exudate Color: red, brown After Cleansing: No brino Yes Wound Bed Granulation Amount: Small (1-33%) Exposed Structure Granulation Quality: Red Fascia Exposed: No Necrotic Amount: Large (67-100%) Fat Layer (Subcutaneous Tissue) Exposed: Yes Necrotic Quality: Eschar, Adherent Slough Tendon Exposed: No Muscle Exposed: No Joint Exposed: No Bone Exposed: No Treatment Notes Nina Perez, Nina Perez ([id number]) Wound #1 (Left, Lateral Foot) Notes Santyl, abd, conform Electronic Signature(s) Signed: 04/20/2019 4:08:03 PM By: Army Melia Entered By: Army Melia on 04/20/2019 15:02:49 Nina Perez ([id number]) -------------------------------------------------------------------------------- Vitals Details Patient Name: Nina Perez Date of Service: 04/20/2019 2:45 PM Medical Record Number: [id number] Patient Account Number: [id number] Date of Birth/Sex: [birth year] (78 y.o. F) Treating RN: Army Melia Primary Care Sajid Ruppert: Einar Pheasant Other Clinician: Referring Lois Slagel: Einar Pheasant Treating Orrie Schubert/Extender: Tito Dine in Treatment: 7 Vital Signs Time Taken: 14:59 Temperature (F): 97.8 Height (in): 66 Pulse (bpm): 60 Weight (lbs): 230 Respiratory Rate (breaths/min): 16 Body Mass Index (BMI): 37.1 Blood Pressure (mmHg): 155/89 Reference Range: 80 - 120 mg / dl Electronic Signature(s) Signed: 04/20/2019 4:08:03 PM By: Army Melia Entered By: Army Melia on 04/20/2019 15:07:35  Severity and Description of Pain Patient Has Paino Yes Site Locations Pain Location: Pain in Ulcers Rate the pain. Current Pain Level: 10 Pain Management and Medication Current Pain Management: Electronic Signature(s) Signed: 04/20/2019 4:08:03 PM By: Army Melia Entered By: Army Melia on 04/20/2019 14:59:50 Nina Perez ([id number]) -------------------------------------------------------------------------------- Patient/Caregiver Education Details Patient Name: Nina Perez Date of Service: 04/20/2019 2:45 PM Medical Record Number: [id number] Patient Account Number: [id number] Date of Birth/Gender: [birth year] (78 y.o. F) Treating RN: Cornell Barman Primary Care Physician: Einar Pheasant Other Clinician: Referring Physician: Einar Pheasant Treating Physician/Extender: Tito Dine in Treatment: 7 Education Assessment Education Provided To: Patient Education Topics Provided Wound/Skin Impairment: Handouts: Caring for Your Ulcer Methods: Demonstration, Explain/Verbal Responses: State content correctly Electronic Signature(s) Signed: 04/20/2019 5:38:26 PM By: Gretta Cool, BSN, RN, CWS, Kim RN, BSN Entered By: Gretta Cool, BSN, RN, CWS, Kim on 04/20/2019 15:41:24 Nina Perez ([id number]) -------------------------------------------------------------------------------- Wound Assessment Details Patient Name: Nina Perez Date of Service: 04/20/2019 2:45 PM Medical Record Number: [id number] Patient Account Number: [id number] Date of Birth/Sex: Feb 10, 1941 (78 y.o. F) Treating RN: Army Melia Primary Care Margretta Zamorano: Einar Pheasant Other Clinician: Referring Sarabella Caprio: Einar Pheasant Treating Burton Gahan/Extender: Tito Dine in Treatment: 7 Wound Status Wound Number: 1 Primary Diabetic Wound/Ulcer of the Lower Extremity Etiology: Wound Location: Left Foot - Lateral Wound Open Wounding Event: Gradually Appeared Status: Date Acquired: 01/17/2019 Comorbid Hypertension, Type II Diabetes, Scleroderma, Weeks Of Treatment: 7 History: Gout, Rheumatoid Arthritis Clustered Wound: No Photos Wound Measurements Length: (cm) 1.6 Width: (cm) 0.7 Depth: (cm) 0.5 Area: (cm) 0.88 Volume: (cm) 0.44 % Reduction in Area: 11.1% % Reduction in Volume: -122.2% Epithelialization: None Tunneling: No Undermining: No Wound Description Classification: Grade 1 Foul Odor Wound Margin: Flat and Intact Slough/Fi Exudate Amount: Medium Exudate Type:  Serosanguineous Exudate Color: red, brown After Cleansing: No brino Yes Wound Bed Granulation Amount: Small (1-33%) Exposed Structure Granulation Quality: Red Fascia Exposed: No Necrotic Amount: Large (67-100%) Fat Layer (Subcutaneous Tissue) Exposed: Yes Necrotic Quality: Eschar, Adherent Slough Tendon Exposed: No Muscle Exposed: No Joint Exposed: No Bone Exposed: No Treatment Notes Nina Perez, Nina Perez ([id number]) Wound #1 (Left, Lateral Foot) Notes Santyl, abd, conform Electronic Signature(s) Signed: 04/20/2019 4:08:03 PM By: Army Melia Entered By: Army Melia on 04/20/2019 15:02:49 Nina Perez ([id number]) -------------------------------------------------------------------------------- Vitals Details Patient Name: Nina Perez Date of Service: 04/20/2019 2:45 PM Medical Record Number: [id number] Patient Account Number: [id number] Date of Birth/Sex: [birth year] (78 y.o. F) Treating RN: Army Melia Primary Care Sajid Ruppert: Einar Pheasant Other Clinician: Referring Lois Slagel: Einar Pheasant Treating Orrie Schubert/Extender: Tito Dine in Treatment: 7 Vital Signs Time Taken: 14:59 Temperature (F): 97.8 Height (in): 66 Pulse (bpm): 60 Weight (lbs): 230 Respiratory Rate (breaths/min): 16 Body Mass Index (BMI): 37.1 Blood Pressure (mmHg): 155/89 Reference Range: 80 - 120 mg / dl Electronic Signature(s) Signed: 04/20/2019 4:08:03 PM By: Army Melia Entered By: Army Melia on 04/20/2019 15:07:35

## 2019-04-21 NOTE — Progress Notes (Signed)
Nina Perez, Nina Perez ([id number]) Visit Report for 04/20/2019 Debridement Details Patient Name: Nina Perez, Nina Perez Date of Service: 04/20/2019 2:45 PM Medical Record Number: [id number] Patient Account Number: [id number] Date of Birth/Sex: [birth year] (78 y.o. F) Treating RN: Cornell Barman Primary Care Provider: Einar Pheasant Other Clinician: Referring Provider: Einar Pheasant Treating Provider/Extender: Tito Dine in Treatment: 7 Debridement Performed for Wound #1 Left,Lateral Foot Assessment: Performed By: Physician Ricard Dillon, MD Debridement Type: Debridement Severity of Tissue Pre Fat layer exposed Debridement: Level of Consciousness (Pre- Awake and Alert procedure): Pre-procedure Verification/Time Yes - 15:35 Out Taken: Start Time: 15:35 Pain Control: Lidocaine Total Area Debrided (L x W): 1.6 (cm) x 0.7 (cm) = 1.12 (cm) Tissue and other material Viable, Non-Viable, Slough, Subcutaneous, Slough debrided: Level: Skin/Subcutaneous Tissue Debridement Description: Excisional Instrument: Curette Bleeding: Minimum Hemostasis Achieved: Pressure End Time: 15:39 Response to Treatment: Procedure was tolerated well Level of Consciousness Awake and Alert (Post-procedure): Post Debridement Measurements of Total Wound Length: (cm) 1.6 Width: (cm) 0.7 Depth: (cm) 0.5 Volume: (cm) 0.44 Character of Wound/Ulcer Post Debridement: Stable Severity of Tissue Post Debridement: Fat layer exposed Post Procedure Diagnosis Same as Pre-procedure Electronic Signature(s) Signed: 04/20/2019 5:38:26 PM By: Gretta Cool, BSN, RN, CWS, Kim RN, BSN Signed: 04/20/2019 6:09:55 PM By: Linton Ham MD Entered By: Linton Ham on 04/20/2019 17:25:52 Nina Perez ([id number]) -------------------------------------------------------------------------------- HPI Details Patient Name: Nina Perez Date of Service: 04/20/2019 2:45 PM Medical Record Number: [id number] Patient Account Number:  [id number] Date of Birth/Sex: Nov 01, 1940 (78 y.o. F) Treating RN: Cornell Barman Primary Care Provider: Einar Pheasant Other Clinician: Referring Provider: Einar Pheasant Treating Provider/Extender: Tito Dine in Treatment: 7 History of Present Illness HPI Description: ADMISSION 03/02/2019 Mrs. Hodde is a 78 year old woman with a complicated past medical history who presents for review of wound on the left lateral foot for the last 6 weeks. She states she simply looked down and saw a black spot on the left lateral foot and thought this was dirt went to wipe it off and there was an open wound. This is not gotten any better and over the timeframe. She has been using topical triple antibiotic ointment. She has a history of chronic venous insufficiency and has had wounds on her lower legs related to this but never on her feet. She is known to Dr. Aleda Grana at [facility] in [place] and she went to see him about this. He did not order any specific tests and said that he was not exactly sure what it because this per the patient's description. She was put in an [employer] but it looked as though this was stopped on 10/8. She had a week's worth of doxycycline as well. Patient states the area is very painful rating it at a 10 out of 10. She does not really describe claudication element. 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 X-ray we ordered last week was negative for any bony abnormality The patient had venous studies on 06/16/2018 in the [facility] system. There was no thrombosis and they did not comment on reflux. She has had previous vein surgery ABIs in this clinic were 0.96 on the right and 0.88 on the left 10/28; the patient arrives with a small punched out area on the left lateral foot. This  appears somewhat better than last week but still with a nonviable  surface. I did not feel when I admitted her last week that she had significant arterial disease. She definitely has chronic venous insufficiency and multiple other rheumatologic issues including rheumatoid arthritis, scleroderma crest syndrome but the exact etiology of this wound is not really clear in its location. We used Santyl 11/4; small punched out area on the left anterior lateral foot. The exact reason for this wound is not really clear. She has venous disease however I am not certain that this explains the presentation like this. She saw Dr. Aleda Grana of [facility] before she came into this clinic. She does have other possible alternatives for this wound. I do not believe she has an arterial issue however she does have rheumatoid arthritis, crest syndrome etc. I have been concerned to actually biopsy this however unless of course it worsens 11/11; small punched out area on the left anterior lateral foot. The cause of this is never really clear. She has venous disease previous venous stripping and was followed by Dr. Aleda Grana of [facility]. She saw Dr. Ozella Almond office in 2018 once. She does not want to go back there. She was not felt to have a DVT or evidence of reflux at that time. Although it was difficult to sort out I really do not believe this lady has had formal arterial studies. The area on the left lateral foot still has debris requiring debridement. This is disappointing she has been using Santyl. 11/18; small punched out area on the left lateral foot. Using Santyl and certainly the adherent debris at the base of this is a lot better. She is going for arterial studies later today at Dr. Gwenlyn Found and Dr. Cyndi Lennert office in [place]. If these show adequate blood flow I think we need to reach out to her rheumatologist about discontinuing the methotrexate. The cause of this wound has never really  been clear to me. She does however have a complicated rheumatologic history [see above] 04/06/2019 on evaluation today patient appears to be doing well at this point with regard to her wound. The size is really about the same as previously noted. With that being said this is the first time I seen her but it appears that she has a lot more slough buildup than she did last week when collagen was used. This is something that the patient's case manager nurse also stated to me as well. I really feel like the Santyl likely is going to do better for her. She does have an appointment with Dr. Naida Sleight office on December 1. SAMANTHAJO, TARKINGTON ([id number]) 12/2; changed back to sample last week. I looked at the picture and she had a large amount of necrotic debris in the wound surface and slough I certainly agree with this. The wound actually looks better today but still required debridement I took a call from Dr. Gwenlyn Found yesterday. He felt that the patient had a 80% left SFA stenosis although he was not at all sure that the stenosis was the cause of the patient's wound where that the nonhealing state was related to ischemia. We agreed that we would attempt ongoing wound care and that if the wound stalled deteriorated we would consider further interventions including angiography. I should note that the patient pointed out to me today that the only time this really bothers her is when she is lying in bed and the pain is relieved by putting her leg over the edge of the bed. Unfortunately if she did not seem to tell  Dr. Gwenlyn Found this 12/9; punched out area on the left lateral foot. The surface of this looks better. We have been using Santyl she has been changing the dressing. She complains of a lot of pain in the foot specifically the tips of her first and fourth toes. She shows me a small little splinter hemorrhages that look like telangiectasias on the tip of some of her fingers. No doubt this is related to  her scleroderma. As I noted on my note from last week;. Dr. Gwenlyn Found was not convinced the SFA stenosis was related to the wound on her foot. He wanted wound care to see if this would heal on its own without arterial intervention. Electronic Signature(s) Signed: 04/20/2019 6:09:55 PM By: Linton Ham MD Entered By: Linton Ham on 04/20/2019 17:27:27 Nina Perez ([id number]) -------------------------------------------------------------------------------- Physical Exam Details Patient Name: Nina Perez Date of Service: 04/20/2019 2:45 PM Medical Record Number: [id number] Patient Account Number: [id number] Date of Birth/Sex: 05-17-1940 (78 y.o. F) Treating RN: Cornell Barman Primary Care Provider: Einar Pheasant Other Clinician: Referring Provider: Einar Pheasant Treating Provider/Extender: Tito Dine in Treatment: 7 Constitutional Patient is hypertensive.. Pulse regular and within target range for patient.Marland Kitchen Respirations regular, non-labored and within target range.. Temperature is normal and within the target range for the patient.Marland Kitchen appears in no distress. Eyes Conjunctivae clear. No discharge. Cardiovascular Pedal pulses are palpable. Integumentary (Hair, Skin) Subdermal hemorrhages on the tips of the left first and fourth toes which are painful. Splinter hemorrhages on the tips of some of her fingers.Marland Kitchen Psychiatric No evidence of depression, anxiety, or agitation. Calm, cooperative, and communicative. Appropriate interactions and affect.. Notes Wound exam; the patient's wound on the left dorsal lateral foot actually looks better. Still requiring debridement but cleans up quite nicely however there is still depth. No surrounding infection Electronic Signature(s) Signed: 04/20/2019 6:09:55 PM By: Linton Ham MD Entered By: Linton Ham on 04/20/2019 17:29:18 Nina Perez  ([id number]) -------------------------------------------------------------------------------- Physician Orders Details Patient Name: Nina Perez Date of Service: 04/20/2019 2:45 PM Medical Record Number: [id number] Patient Account Number: [id number] Date of Birth/Sex: 1940-06-05 (78 y.o. F) Treating RN: Cornell Barman Primary Care Provider: Einar Pheasant Other Clinician: Referring Provider: Einar Pheasant Treating Provider/Extender: Tito Dine in Treatment: 7 Verbal / Phone Orders: No Diagnosis Coding Wound Cleansing Wound #1 Left,Lateral Foot o Clean wound with Normal Saline. o May Shower, gently pat wound dry prior to applying new dressing. Anesthetic (add to Medication List) Wound #1 Left,Lateral Foot o Topical Lidocaine 4% cream applied to wound bed prior to debridement (In Clinic Only). Primary Wound Dressing Wound #1 Left,Lateral Foot o Santyl Ointment Secondary Dressing Wound #1 Left,Lateral Foot o ABD and Kerlix/Conform Dressing Change Frequency Wound #1 Left,Lateral Foot o Change dressing every day. Follow-up Appointments Wound #1 Left,Lateral Foot o Return Appointment in 1 week. Edema Control Wound #1 Left,Lateral Foot o Patient to wear own compression stockings o Elevate legs to the level of the heart and pump ankles as often as possible Off-Loading Wound #1 Left,Lateral Foot o Other: - keep pressure off of wound area Additional Orders / Instructions Wound #1 Left,Lateral Foot o Activity as tolerated Medications-please add to medication list. Wound #1 Left,Lateral Foot SEDINA, HAUTH ([id number]) o Santyl Enzymatic Ointment Electronic Signature(s) Signed: 04/20/2019 5:38:26 PM By: Gretta Cool, BSN, RN, CWS, Kim RN, BSN Signed: 04/20/2019 6:09:55 PM By: Linton Ham MD Entered By: Gretta Cool, BSN, RN, CWS, Kim on 04/20/2019 15:40:54 Nina Perez  ([id number]) -------------------------------------------------------------------------------- Problem List Details  Patient Name: PAT, REINERTSEN Date of Service: 04/20/2019 2:45 PM Medical Record Number: [id number] Patient Account Number: [id number] Date of Birth/Sex: [birth year] (78 y.o. F) Treating RN: Cornell Barman Primary Care Provider: Einar Pheasant Other Clinician: Referring Provider: Einar Pheasant Treating Provider/Extender: Tito Dine in Treatment: 7 Active Problems ICD-10 Evaluated Encounter Code Description Active Date Today Diagnosis E11.621 Type 2 diabetes mellitus with foot ulcer 03/02/2019 No Yes I87.323 Chronic venous hypertension (idiopathic) with inflammation of 03/02/2019 No Yes bilateral lower extremity L97.528 Non-pressure chronic ulcer of other part of left foot with other 03/02/2019 No Yes specified severity M05.7A Rheumatoid arthritis with rheumatoid factor of other specified 03/02/2019 No Yes site without organ or systems involvement Inactive Problems Resolved Problems Electronic Signature(s) Signed: 04/20/2019 6:09:55 PM By: Linton Ham MD Entered By: Linton Ham on 04/20/2019 17:18:52 Nina Perez ([id number]) -------------------------------------------------------------------------------- Progress Note Details Patient Name: Nina Perez Date of Service: 04/20/2019 2:45 PM Medical Record Number: [id number] Patient Account Number: [id number] Date of Birth/Sex: 1940/11/03 (78 y.o. F) Treating RN: Cornell Barman Primary Care Provider: Einar Pheasant Other Clinician: Referring Provider: Einar Pheasant Treating Provider/Extender: Tito Dine in Treatment: 7 Subjective History of Present Illness (HPI) ADMISSION 03/02/2019 Mrs. Snuggs is a 78 year old woman with a complicated past medical history who presents for review of wound on the left lateral foot for the last 6 weeks. She states she simply looked down and saw a black spot  on the left lateral foot and thought this was dirt went to wipe it off and there was an open wound. This is not gotten any better and over the timeframe. She has been using topical triple antibiotic ointment. She has a history of chronic venous insufficiency and has had wounds on her lower legs related to this but never on her feet. She is known to Dr. Aleda Grana at [facility] in [place] and she went to see him about this. He did not order any specific tests and said that he was not exactly sure what it because this per the patient's description. She was put in an [employer] but it looked as though this was stopped on 10/8. She had a week's worth of doxycycline as well. Patient states the area is very painful rating it at a 10 out of 10. She does not really describe claudication element. 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 X-ray we ordered last week was negative for any bony abnormality The patient had venous studies on 06/16/2018 in the [facility] system. There was no thrombosis and they did not comment on reflux. She has had previous vein surgery ABIs in this clinic were 0.96 on the right and 0.88 on the left 10/28; the patient arrives with a small punched out area on the left lateral foot. This appears somewhat better than last week but still with a nonviable surface. I did not feel when I admitted her last week that she had significant arterial disease. She definitely has chronic venous insufficiency and multiple other rheumatologic issues including rheumatoid arthritis, scleroderma crest syndrome but the exact etiology of this wound is not really clear in its location. We used Santyl 11/4; small punched out area on the left anterior lateral foot. The exact reason for this wound is not really  clear. She has venous disease however I am not certain that this explains the  presentation like this. She saw Dr. Aleda Grana of [facility] before she came into this clinic. She does have other possible alternatives for this wound. I do not believe she has an arterial issue however she does have rheumatoid arthritis, crest syndrome etc. I have been concerned to actually biopsy this however unless of course it worsens 11/11; small punched out area on the left anterior lateral foot. The cause of this is never really clear. She has venous disease previous venous stripping and was followed by Dr. Aleda Grana of [facility]. She saw Dr. Ozella Almond office in 2018 once. She does not want to go back there. She was not felt to have a DVT or evidence of reflux at that time. Although it was difficult to sort out I really do not believe this lady has had formal arterial studies. The area on the left lateral foot still has debris requiring debridement. This is disappointing she has been using Santyl. 11/18; small punched out area on the left lateral foot. Using Santyl and certainly the adherent debris at the base of this is a lot better. She is going for arterial studies later today at Dr. Gwenlyn Found and Dr. Cyndi Lennert office in [place]. If these show adequate blood flow I think we need to reach out to her rheumatologist about discontinuing the methotrexate. The cause of this wound has never really been clear to me. She does however have a complicated rheumatologic history [see above] 04/06/2019 on evaluation today patient appears to be doing well at this point with regard to her wound. The size is really about the same as previously noted. With that being said this is the first time I seen her but it appears that she has a lot more slough buildup than she did last week when collagen was used. This is something that the patient's case manager nurse also stated to me as well. I really feel like the  Santyl likely is going to do better for her. She does have an appointment with Dr. Edison Pace, Nadara Mode ([id number]) [facility] on December 1. 12/2; changed back to sample last week. I looked at the picture and she had a large amount of necrotic debris in the wound surface and slough I certainly agree with this. The wound actually looks better today but still required debridement I took a call from Dr. Gwenlyn Found yesterday. He felt that the patient had a 80% left SFA stenosis although he was not at all sure that the stenosis was the cause of the patient's wound where that the nonhealing state was related to ischemia. We agreed that we would attempt ongoing wound care and that if the wound stalled deteriorated we would consider further interventions including angiography. I should note that the patient pointed out to me today that the only time this really bothers her is when she is lying in bed and the pain is relieved by putting her leg over the edge of the bed. Unfortunately if she did not seem to tell Dr. Gwenlyn Found this 12/9; punched out area on the left lateral foot. The surface of this looks better. We have been using Santyl she has been changing the dressing. She complains of a lot of pain in the foot specifically the tips of her first and fourth toes. She shows me a small little splinter hemorrhages that look like telangiectasias on the tip of some of her fingers. No doubt this is related to her scleroderma. As I noted on my note from last week;.  Dr. Gwenlyn Found was not convinced the SFA stenosis was related to the wound on her foot. He wanted wound care to see if this would heal on its own without arterial intervention. Objective Constitutional Patient is hypertensive.. Pulse regular and within target range for patient.Marland Kitchen Respirations regular, non-labored and within target range.. Temperature is normal and within the target range for the patient.Marland Kitchen appears in no distress. Vitals Time Taken: 2:59 PM, Height:  66 in, Weight: 230 lbs, BMI: 37.1, Temperature: 97.8 F, Pulse: 60 bpm, Respiratory Rate: 16 breaths/min, Blood Pressure: 155/89 mmHg. Eyes Conjunctivae clear. No discharge. Cardiovascular Pedal pulses are palpable. Psychiatric No evidence of depression, anxiety, or agitation. Calm, cooperative, and communicative. Appropriate interactions and affect.. General Notes: Wound exam; the patient's wound on the left dorsal lateral foot actually looks better. Still requiring debridement but cleans up quite nicely however there is still depth. No surrounding infection Integumentary (Hair, Skin) Subdermal hemorrhages on the tips of the left first and fourth toes which are painful. Splinter hemorrhages on the tips of some of her fingers.. Wound #1 status is Open. Original cause of wound was Gradually Appeared. The wound is located on the Left,Lateral Foot. The wound measures 1.6cm length x 0.7cm width x 0.5cm depth; 0.88cm^2 area and 0.44cm^3 volume. There is Fat Layer (Subcutaneous Tissue) Exposed exposed. There is no tunneling or undermining noted. There is a medium amount of serosanguineous drainage noted. The wound margin is flat and intact. There is small (1-33%) red granulation within the wound bed. There is a large (67-100%) amount of necrotic tissue within the wound bed including Eschar and Adherent DIESHA, BULLOUGH. ([id number]) [place]. Assessment Active Problems ICD-10 Type 2 diabetes mellitus with foot ulcer Chronic venous hypertension (idiopathic) with inflammation of bilateral lower extremity Non-pressure chronic ulcer of other part of left foot with other specified severity Rheumatoid arthritis with rheumatoid factor of other specified site without organ or systems involvement Procedures Wound #1 Pre-procedure diagnosis of Wound #1 is a Diabetic Wound/Ulcer of the Lower Extremity located on the Left,Lateral Foot .Severity of Tissue Pre Debridement is: Fat layer exposed. There was a  Excisional Skin/Subcutaneous Tissue Debridement with a total area of 1.12 sq cm performed by Ricard Dillon, MD. With the following instrument(s): Curette to remove Viable and Non-Viable tissue/material. Material removed includes Subcutaneous Tissue and Slough and after achieving pain control using Lidocaine. No specimens were taken. A time out was conducted at 15:35, prior to the start of the procedure. A Minimum amount of bleeding was controlled with Pressure. The procedure was tolerated well. Post Debridement Measurements: 1.6cm length x 0.7cm width x 0.5cm depth; 0.44cm^3 volume. Character of Wound/Ulcer Post Debridement is stable. Severity of Tissue Post Debridement is: Fat layer exposed. Post procedure Diagnosis Wound #1: Same as Pre-Procedure Plan Wound Cleansing: Wound #1 Left,Lateral Foot: Clean wound with Normal Saline. May Shower, gently pat wound dry prior to applying new dressing. Anesthetic (add to Medication List): Wound #1 Left,Lateral Foot: Topical Lidocaine 4% cream applied to wound bed prior to debridement (In Clinic Only). Primary Wound Dressing: Wound #1 Left,Lateral Foot: Santyl Ointment Secondary Dressing: Wound #1 Left,Lateral Foot: ABD and Kerlix/Conform Dressing Change Frequency: Wound #1 Left,Lateral Foot: Change dressing every day. Follow-up Appointments: Wound #1 Left,Lateral Foot: MILLA, MCPETERS ([id number]) Return Appointment in 1 week. Edema Control: Wound #1 Left,Lateral Foot: Patient to wear own compression stockings Elevate legs to the level of the heart and pump ankles as often as possible Off-Loading: Wound #1 Left,Lateral Foot: Other: - keep pressure  off of wound area Additional Orders / Instructions: Wound #1 Left,Lateral Foot: Activity as tolerated Medications-please add to medication list.: Wound #1 Left,Lateral Foot: Santyl Enzymatic Ointment I am continuing with Santyl ointment. The patient's wound bed appears to be cleaning up  quite nicely we have been using ABDs Curlex and conformer. The patient is changing this dressing 2. I will talk to Dr. Jefm Bryant about the advisability of holding the methotrexate to see if this would affect wound healing 3. She has telangiectasias on the tips of her fingers however the areas on the left fourth and first toes I am not sure about this almost looks like a vasculopathy Electronic Signature(s) Signed: 04/20/2019 6:09:55 PM By: Linton Ham MD Entered By: Linton Ham on 04/20/2019 17:30:29 Nina Perez ([id number]) -------------------------------------------------------------------------------- [place] Details Patient Name: Nina Perez Date of Service: 04/20/2019 Medical Record Number: [id number] Patient Account Number: [id number] Date of Birth/Sex: 04-Apr-1941 (78 y.o. 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 involvement Facility Procedures CPT4 Code Description: [id number] - DEB SUBQ TISSUE 20 SQ CM/< ICD-10 Diagnosis Description 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

## 2019-04-25 ENCOUNTER — Other Ambulatory Visit: Payer: Self-pay | Admitting: Internal Medicine

## 2019-04-25 NOTE — Telephone Encounter (Signed)
Requested medication (s) are due for refill today: yes  Requested medication (s) are on the active medication list: yes  Last refill: 03/23/2019  Future visit scheduled: yes  Notes to clinic:  refill cannot be delegated    Requested Prescriptions  Pending Prescriptions Disp Refills   traMADol (ULTRAM) 50 MG tablet 30 tablet 0    Sig: Take 1 tablet (50 mg total) by mouth every 8 (eight) hours as needed.      There is no refill protocol information for this order

## 2019-04-25 NOTE — Telephone Encounter (Signed)
Medication Refill - Medication: traMADol (ULTRAM) 50 MG tablet  Has the patient contacted their pharmacy? no (Agent: If no, request that the patient contact the pharmacy for the refill.) (Agent: If yes, when and what did the pharmacy advise?)  Preferred Pharmacy (with phone number or street name):  [place], [place] - [place] Phone:  [phone]  Fax:  [phone]     Agent: Please be advised that RX refills may take up to 3 business days. We ask that you follow-up with your pharmacy.

## 2019-04-25 NOTE — Telephone Encounter (Signed)
Please clarify if pt still needs tramadol.  This was given to her to help with pain from her leg wound.  If so, clarify how she is taking.

## 2019-04-26 ENCOUNTER — Other Ambulatory Visit: Payer: Self-pay | Admitting: Internal Medicine

## 2019-04-26 MED ORDER — TRAMADOL HCL 50 MG PO TABS: 50.0000 mg | ORAL_TABLET | Freq: Two times a day (BID) | ORAL | 0 refills | Status: DC | PRN

## 2019-04-26 NOTE — Telephone Encounter (Signed)
She does still need this because she does still have the wound on her foot but she is not having to use it 3 times daily. She was using it at the most twice daily. She is out of medication and would like to have sent in today. Send to [facility] in [place]

## 2019-04-26 NOTE — Telephone Encounter (Signed)
rx sent in for tramadol.

## 2019-04-27 ENCOUNTER — Other Ambulatory Visit: Payer: Self-pay

## 2019-04-27 ENCOUNTER — Encounter: Payer: Medicare Other | Admitting: Internal Medicine

## 2019-04-27 DIAGNOSIS — E11621 Type 2 diabetes mellitus with foot ulcer: Secondary | ICD-10-CM | POA: Diagnosis not present

## 2019-04-27 NOTE — Telephone Encounter (Signed)
Pts husband is aware.  °

## 2019-04-28 NOTE — Progress Notes (Signed)
CALEESI, GLATT ([id number]) Visit Report for 04/27/2019 Arrival Information Details Patient Name: Nina Perez, Nina Perez Date of Service: 04/27/2019 3:00 PM Medical Record Number: [id number] Patient Account Number: [id number] Date of Birth/Sex: [birth year] (78 y.o. F) Treating RN: Huel Coventry Primary Care Emmer Lillibridge: Dale [place] Other Clinician: Referring Sheralee Qazi: Dale [place] Treating Dayn Barich/Extender: Altamese [place] in Treatment: 8 Visit Information History Since Last Visit Added or deleted any medications: Yes Patient Arrived: Wheel Chair Any new allergies or adverse reactions: No Arrival Time: 14:45 Had a fall or experienced change in No Accompanied By: husband activities of daily living that may affect Transfer Assistance: Manual risk of falls: Patient Identification Verified: Yes Signs or symptoms of abuse/neglect since last visito No Secondary Verification Process Completed: Yes Hospitalized since last visit: No Implantable device outside of the clinic excluding No cellular tissue based products placed in the center since last visit: Has Dressing in Place as Prescribed: Yes Pain Present Now: No Electronic Signature(s) Signed: 04/27/2019 3:29:14 PM By: Dayton Martes RCP, RRT, CHT Entered By: Dayton Martes on 04/27/2019 14:47:09 Nina Perez ([id number]) -------------------------------------------------------------------------------- Encounter Discharge Information Details Patient Name: Nina Perez Date of Service: 04/27/2019 3:00 PM Medical Record Number: [id number] Patient Account Number: [id number] Date of Birth/Sex: [birth year] (78 y.o. F) Treating RN: Huel Coventry Primary Care Niccole Witthuhn: Dale [place] Other Clinician: Referring Eithen Castiglia: Dale Liborio Negron Torres Treating Micole Delehanty/Extender: Altamese [place] in Treatment: 8 Encounter Discharge Information Items Post Procedure Vitals Discharge Condition: Stable Temperature (F):  98.6 Ambulatory Status: Ambulatory Pulse (bpm): 69 Discharge Destination: Home Respiratory Rate (breaths/min): 16 Transportation: Private Auto Blood Pressure (mmHg): 160/72 Accompanied By: self Schedule Follow-up Appointment: Yes Clinical Summary of Care: Electronic Signature(s) Signed: 04/28/2019 11:42:17 AM By: Elliot Gurney, BSN, RN, CWS, Kim RN, BSN Entered By: Elliot Gurney, BSN, RN, CWS, Kim on 04/27/2019 15:03:53 Nina Perez ([id number]) -------------------------------------------------------------------------------- Lower Extremity Assessment Details Patient Name: Nina Perez Date of Service: 04/27/2019 3:00 PM Medical Record Number: [id number] Patient Account Number: [id number] Date of Birth/Sex: April 01, 1941 (78 y.o. F) Treating RN: Rodell Perna Primary Care Tarek Cravens: Dale Mason Other Clinician: Referring Cheyane Ayon: Dale [place] Treating Marylyn Appenzeller/Extender: Maxwell Caul [place] in Treatment: 8 Edema Assessment Assessed: [Left: No] [Right: No] Edema: [Left: N] [Right: o] Vascular Assessment Pulses: Dorsalis Pedis Palpable: [Left:Yes] Electronic Signature(s) Signed: 04/27/2019 4:12:44 PM By: Rodell Perna Entered By: Rodell Perna on 04/27/2019 14:51:17 Nina Perez ([id number]) -------------------------------------------------------------------------------- Multi Wound Chart Details Patient Name: Nina Perez Date of Service: 04/27/2019 3:00 PM Medical Record Number: [id number] Patient Account Number: [id number] Date of Birth/Sex: [birth year] (78 y.o. F) Treating RN: Huel Coventry Primary Care Camika Marsico: Dale [place] Other Clinician: Referring Marthena Whitmyer: Dale [place] Treating Ytzel Gubler/Extender: Altamese [place] in Treatment: 8 Vital Signs Height(in): 66 Pulse(bpm): 69 Weight(lbs): 230 Blood Pressure(mmHg): 160/72 Body Mass Index(BMI): 37 Temperature(F): 98.3 Respiratory Rate 16 (breaths/min): Photos: [N/A:N/A] Wound Location: Left Foot - Lateral  N/A N/A Wounding Event: Gradually Appeared N/A N/A Primary Etiology: Diabetic Wound/Ulcer of the N/A N/A Lower Extremity Comorbid History: Hypertension, Type II N/A N/A Diabetes, Scleroderma, Gout, Rheumatoid Arthritis Date Acquired: 01/17/2019 N/A N/A Weeks of Treatment: 8 N/A N/A Wound Status: Open N/A N/A Measurements L x W x D 1.6x1x0.4 N/A N/A (cm) Area (cm) : 1.257 N/A N/A Volume (cm) : 0.503 N/A N/A % Reduction in Area: -27.00% N/A N/A % Reduction in Volume: -154.00% N/A N/A Classification: Grade 1 N/A N/A Exudate Amount: Medium N/A N/A Exudate Type: Serosanguineous N/A N/A Exudate Color: red, brown N/A N/A Wound  Margin: Flat and Intact N/A N/A Granulation Amount: Small (1-33%) N/A N/A Granulation Quality: Red N/A N/A Necrotic Amount: Large (67-100%) N/A N/A Necrotic Tissue: Eschar, Adherent Slough N/A N/A Exposed Structures: Fat Layer (Subcutaneous N/A N/A Tissue) Exposed: Yes Fascia: No Tendon: No Nina Perez, Nina Perez ([id number]) Muscle: No Joint: No Bone: No Epithelialization: None N/A N/A Debridement: Debridement - Excisional N/A N/A Pre-procedure 14:57 N/A N/A Verification/Time Out Taken: Pain Control: Lidocaine N/A N/A Tissue Debrided: Subcutaneous, Slough N/A N/A Level: Skin/Subcutaneous Tissue N/A N/A Debridement Area (sq cm): 1.6 N/A N/A Instrument: Curette N/A N/A Bleeding: Minimum N/A N/A Hemostasis Achieved: Pressure N/A N/A Debridement Treatment Procedure was tolerated well N/A N/A Response: Post Debridement 1.6x1x0.4 N/A N/A Measurements L x W x D (cm) Post Debridement Volume: 0.503 N/A N/A (cm) Procedures Performed: Debridement N/A N/A Treatment Notes Wound #1 (Left, Lateral Foot) Notes Santyl, abd, conform Electronic Signature(s) Signed: 04/27/2019 4:44:44 PM By: Baltazar Najjar MD Entered By: Baltazar Najjar on 04/27/2019 16:37:36 Nina Perez  ([id number]) -------------------------------------------------------------------------------- Multi-Disciplinary Care Plan Details Patient Name: Nina Perez Date of Service: 04/27/2019 3:00 PM Medical Record Number: [id number] Patient Account Number: [id number] Date of Birth/Sex: October 10, 1940 (78 y.o. F) Treating RN: Huel Coventry Primary Care Tran Arzuaga: Dale [place] Other Clinician: Referring Katrice Goel: Dale [place] Treating Bless Lisenby/Extender: Altamese [place] in Treatment: 8 Active Inactive Abuse / Safety / Falls / Self Care Management Nursing Diagnoses: Impaired physical mobility Potential for falls Goals: Patient will remain injury free related to falls Date Initiated: 03/02/2019 Target Resolution Date: 03/16/2019 Goal Status: Active Interventions: Assess fall risk on admission and as needed Notes: Medication Nursing Diagnoses: Knowledge deficit related to medication safety: actual or potential Goals: Patient/caregiver will demonstrate understanding of all current medications Date Initiated: 03/02/2019 Target Resolution Date: 03/17/2019 Goal Status: Active Interventions: Assess for medication contraindications each visit where new medications are prescribed Notes: Necrotic Tissue Nursing Diagnoses: Impaired tissue integrity related to necrotic/devitalized tissue Goals: Necrotic/devitalized tissue will be minimized in the wound bed Date Initiated: 03/02/2019 Target Resolution Date: 03/17/2019 Goal Status: Active Interventions: Nina Perez, Nina Perez ([id number]) Assess patient pain level pre-, during and post procedure and prior to discharge Treatment Activities: Apply topical anesthetic as ordered : 03/02/2019 Notes: Orientation to the Wound Care Program Nursing Diagnoses: Knowledge deficit related to the wound healing center program Goals: Patient/caregiver will verbalize understanding of the Wound Healing Center Program Date Initiated: 03/02/2019 Target  Resolution Date: 03/17/2019 Goal Status: Active Interventions: Provide education on orientation to the wound center Notes: Venous Leg Ulcer Nursing Diagnoses: Actual venous Insuffiency (use after diagnosis is confirmed) Goals: Patient/caregiver will verbalize understanding of disease process and disease management Date Initiated: 03/02/2019 Target Resolution Date: 03/17/2019 Goal Status: Active Verify adequate tissue perfusion prior to therapeutic compression application Date Initiated: 03/02/2019 Target Resolution Date: 03/17/2019 Goal Status: Active Interventions: Assess peripheral edema status every visit. Provide education on venous insufficiency Notes: Wound/Skin Impairment Nursing Diagnoses: Impaired tissue integrity Goals: Ulcer/skin breakdown will have a volume reduction of 30% by week 4 Date Initiated: 03/02/2019 Target Resolution Date: 04/02/2019 Goal Status: Active Interventions: Assess ulceration(s) every visit Nina Perez, Nina Perez ([id number]) Treatment Activities: Topical wound management initiated : 03/02/2019 Notes: Electronic Signature(s) Signed: 04/28/2019 11:42:17 AM By: Elliot Gurney, BSN, RN, CWS, Kim RN, BSN Entered By: Elliot Gurney, BSN, RN, CWS, Kim on 04/27/2019 14:56:52 Nina Perez ([id number]) -------------------------------------------------------------------------------- Pain Assessment Details Patient Name: Nina Perez Date of Service: 04/27/2019 3:00 PM Medical Record Number: [id number] Patient Account Number: [id number] Date of Birth/Sex: 08-17-1940 (78 y.o. F)  Treating RN: Huel Coventry Primary Care Murriel Eidem: Dale [place] Other Clinician: Referring Arney Mayabb: Dale [place]una Treating Dalylah Ramey/Extender: Altamese [place] in Treatment: 8 Active Problems Location of Pain Severity and Description of Pain Patient Has Paino No Site Locations Pain Management and Medication Current Pain Management: Electronic Signature(s) Signed: 04/27/2019 3:29:14 PM  By: Dayton Martes RCP, RRT, CHT Signed: 04/28/2019 11:42:17 AM By: Elliot Gurney, BSN, RN, CWS, Kim RN, BSN Entered By: Dayton Martes on 04/27/2019 14:47:19 Nina Perez ([id number]) -------------------------------------------------------------------------------- Patient/Caregiver Education Details Patient Name: Nina Perez Date of Service: 04/27/2019 3:00 PM Medical Record Number: [id number] Patient Account Number: [id number] Date of Birth/Gender: Dec 18, 1940 (78 y.o. F) Treating RN: Huel Coventry Primary Care Physician: Dale [place] Other Clinician: Referring Physician: Dale [place] Treating Physician/Extender: Altamese [place] in Treatment: 8 Education Assessment Education Provided To: Patient Education Topics Provided Venous: Wound/Skin Impairment: Handouts: Caring for Your Ulcer Methods: Demonstration, Explain/Verbal Responses: State content correctly Electronic Signature(s) Signed: 04/28/2019 11:42:17 AM By: Elliot Gurney, BSN, RN, CWS, Kim RN, BSN Entered By: Elliot Gurney, BSN, RN, CWS, Kim on 04/27/2019 15:01:21 Nina Perez ([id number]) -------------------------------------------------------------------------------- Wound Assessment Details Patient Name: Nina Perez Date of Service: 04/27/2019 3:00 PM Medical Record Number: [id number] Patient Account Number: [id number] Date of Birth/Sex: [birth year] (78 y.o. F) Treating RN: Rodell Perna Primary Care Nichalos Brenton: Dale [place] Other Clinician: Referring Jerid Catherman: Dale [place] Treating Mckala Pantaleon/Extender: Altamese [place] in Treatment: 8 Wound Status Wound Number: 1 Primary Diabetic Wound/Ulcer of the Lower Extremity Etiology: Wound Location: Left Foot - Lateral Wound Open Wounding Event: Gradually Appeared Status: Date Acquired: 01/17/2019 Comorbid Hypertension, Type II Diabetes, Scleroderma, Weeks Of Treatment: 8 History: Gout, Rheumatoid Arthritis Clustered Wound: No Photos Wound  Measurements Length: (cm) 1.6 Width: (cm) 1 Depth: (cm) 0.4 Area: (cm) 1.257 Volume: (cm) 0.503 % Reduction in Area: -27% % Reduction in Volume: -154% Epithelialization: None Tunneling: No Undermining: No Wound Description Classification: Grade 1 Foul Odor Wound Margin: Flat and Intact Slough/Fi Exudate Amount: Medium Exudate Type: Serosanguineous Exudate Color: red, brown After Cleansing: No brino Yes Wound Bed Granulation Amount: Small (1-33%) Exposed Structure Granulation Quality: Red Fascia Exposed: No Necrotic Amount: Large (67-100%) Fat Layer (Subcutaneous Tissue) Exposed: Yes Necrotic Quality: Eschar, Adherent Slough Tendon Exposed: No Muscle Exposed: No Joint Exposed: No Bone Exposed: No Treatment Notes Nina Perez, Nina Perez ([id number]) Wound #1 (Left, Lateral Foot) Notes Santyl, abd, conform Electronic Signature(s) Signed: 04/27/2019 4:12:44 PM By: Rodell Perna Entered By: Rodell Perna on 04/27/2019 14:51:04 Nina Perez ([id number]) -------------------------------------------------------------------------------- Vitals Details Patient Name: Nina Perez Date of Service: 04/27/2019 3:00 PM Medical Record Number: [id number] Patient Account Number: [id number] Date of Birth/Sex: 1941/05/11 (78 y.o. F) Treating RN: Huel Coventry Primary Care Abuk Selleck: Dale [place] Other Clinician: Referring Nethra Mehlberg: Dale  Treating Sheridyn Canino/Extender: Altamese [place] in Treatment: 8 Vital Signs Time Taken: 14:45 Temperature (F): 98.3 Height (in): 66 Pulse (bpm): 69 Weight (lbs): 230 Respiratory Rate (breaths/min): 16 Body Mass Index (BMI): 37.1 Blood Pressure (mmHg): 160/72 Reference Range: 80 - 120 mg / dl Electronic Signature(s) Signed: 04/27/2019 3:29:14 PM By: Dayton Martes RCP, RRT, CHT Entered By: Dayton Martes on 04/27/2019 14:48:23

## 2019-04-28 NOTE — Progress Notes (Signed)
Nina Perez, Nina Perez ([id number]) Visit Report for 04/27/2019 Debridement Details Patient Name: Nina Perez, Nina Perez Date of Service: 04/27/2019 3:00 PM Medical Record Number: [id number] Patient Account Number: [id number] Date of Birth/Sex: Nov 08, 1940 (78 y.o. F) Treating RN: Cornell Barman Primary Care Provider: Einar Pheasant Other Clinician: Referring Provider: Einar Pheasant Treating Provider/Extender: Tito Dine in Treatment: 8 Debridement Performed for Wound #1 Left,Lateral Foot Assessment: Performed By: Physician Ricard Dillon, MD Debridement Type: Debridement Severity of Tissue Pre Fat layer exposed Debridement: Level of Consciousness (Pre- Awake and Alert procedure): Pre-procedure Verification/Time Yes - 14:57 Out Taken: Start Time: 14:57 Pain Control: Lidocaine Total Area Debrided (L x W): 1.6 (cm) x 1 (cm) = 1.6 (cm) Tissue and other material Viable, Non-Viable, Slough, Subcutaneous, Slough debrided: Level: Skin/Subcutaneous Tissue Debridement Description: Excisional Instrument: Curette Bleeding: Minimum Hemostasis Achieved: Pressure End Time: 14:59 Response to Treatment: Procedure was tolerated well Level of Consciousness Awake and Alert (Post-procedure): Post Debridement Measurements of Total Wound Length: (cm) 1.6 Width: (cm) 1 Depth: (cm) 0.4 Volume: (cm) 0.503 Character of Wound/Ulcer Post Debridement: Stable Severity of Tissue Post Debridement: Fat layer exposed Post Procedure Diagnosis Same as Pre-procedure Electronic Signature(s) Signed: 04/27/2019 4:44:44 PM By: Linton Ham MD Signed: 04/28/2019 11:42:17 AM By: Gretta Cool, BSN, RN, CWS, Kim RN, BSN Entered By: Linton Ham on 04/27/2019 16:38:19 Nina Perez ([id number]) -------------------------------------------------------------------------------- HPI Details Patient Name: Nina Perez Date of Service: 04/27/2019 3:00 PM Medical Record Number: [id number] Patient Account Number:  [id number] Date of Birth/Sex: [birth year] (78 y.o. F) Treating RN: Cornell Barman Primary Care Provider: Einar Pheasant Other Clinician: Referring Provider: Einar Pheasant Treating Provider/Extender: Tito Dine in Treatment: 8 History of Present Illness HPI Description: ADMISSION 03/02/2019 Nina Perez is a 78 year old woman with a complicated past medical history who presents for review of wound on the left lateral foot for the last 6 weeks. She states she simply looked down and saw a black spot on the left lateral foot and thought this was dirt went to wipe it off and there was an open wound. This is not gotten any better and over the timeframe. She has been using topical triple antibiotic ointment. She has a history of chronic venous insufficiency and has had wounds on her lower legs related to this but never on her feet. She is known to Dr. Aleda Grana at [facility] in [place] and she went to see him about this. He did not order any specific tests and said that he was not exactly sure what it because this per the patient's description. She was put in an [employer] but it looked as though this was stopped on 10/8. She had a week's worth of doxycycline as well. Patient states the area is very painful rating it at a 10 out of 10. She does not really describe claudication element. 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 X-ray we ordered last week was negative for any bony abnormality The patient had venous studies on 06/16/2018 in the [facility] system. There was no thrombosis and they did not comment on reflux. She has had previous vein surgery ABIs in this clinic were 0.96 on the right and 0.88 on the left 10/28; the patient arrives with a small punched out area on the left lateral foot. This  appears somewhat better than last week but still with a nonviable  surface. I did not feel when I admitted her last week that she had significant arterial disease. She definitely has chronic venous insufficiency and multiple other rheumatologic issues including rheumatoid arthritis, scleroderma crest syndrome but the exact etiology of this wound is not really clear in its location. We used Santyl 11/4; small punched out area on the left anterior lateral foot. The exact reason for this wound is not really clear. She has venous disease however I am not certain that this explains the presentation like this. She saw Dr. Aleda Grana of [facility] before she came into this clinic. She does have other possible alternatives for this wound. I do not believe she has an arterial issue however she does have rheumatoid arthritis, crest syndrome etc. I have been concerned to actually biopsy this however unless of course it worsens 11/11; small punched out area on the left anterior lateral foot. The cause of this is never really clear. She has venous disease previous venous stripping and was followed by Dr. Aleda Grana of [facility]. She saw Dr. Ozella Almond office in 2018 once. She does not want to go back there. She was not felt to have a DVT or evidence of reflux at that time. Although it was difficult to sort out I really do not believe this lady has had formal arterial studies. The area on the left lateral foot still has debris requiring debridement. This is disappointing she has been using Santyl. 11/18; small punched out area on the left lateral foot. Using Santyl and certainly the adherent debris at the base of this is a lot better. She is going for arterial studies later today at Dr. Gwenlyn Found and Dr. Cyndi Lennert office in [place]. If these show adequate blood flow I think we need to reach out to her rheumatologist about discontinuing the methotrexate. The cause of this wound has never really  been clear to me. She does however have a complicated rheumatologic history [see above] 04/06/2019 on evaluation today patient appears to be doing well at this point with regard to her wound. The size is really about the same as previously noted. With that being said this is the first time I seen her but it appears that she has a lot more slough buildup than she did last week when collagen was used. This is something that the patient's case manager nurse also stated to me as well. I really feel like the Santyl likely is going to do better for her. She does have an appointment with Dr. Naida Sleight office on December 1. Nina Perez, Nina Perez ([id number]) 12/2; changed back to sample last week. I looked at the picture and she had a large amount of necrotic debris in the wound surface and slough I certainly agree with this. The wound actually looks better today but still required debridement I took a call from Dr. Gwenlyn Found yesterday. He felt that the patient had a 80% left SFA stenosis although he was not at all sure that the stenosis was the cause of the patient's wound where that the nonhealing state was related to ischemia. We agreed that we would attempt ongoing wound care and that if the wound stalled deteriorated we would consider further interventions including angiography. I should note that the patient pointed out to me today that the only time this really bothers her is when she is lying in bed and the pain is relieved by putting her leg over the edge of the bed. Unfortunately if she did not seem to tell  Dr. Gwenlyn Found this 12/9; punched out area on the left lateral foot. The surface of this looks better. We have been using Santyl she has been changing the dressing. She complains of a lot of pain in the foot specifically the tips of her first and fourth toes. She shows me a small little splinter hemorrhages that look like telangiectasias on the tip of some of her fingers. No doubt this is related to  her scleroderma. As I noted on my note from last week;. Dr. Gwenlyn Found was not convinced the SFA stenosis was related to the wound on her foot. He wanted wound care to see if this would heal on its own without arterial intervention. 12/16; punched out area on the left lateral foot. Still requiring debridement. We have been using Santyl. She is increasing her complaints of pain in the left first and especially the left fourth toe. I also spoke to Dr. Jefm Bryant today. He was okay with holding the methotrexate although he made the point that methotrexate is rarely complicating wound healing at these doses. He also wondered about pyoderma and the possibility of microcalcifications from her crest syndrome. We did an x-ray of her foot there were not any comments about microcalcification. From my point of view this certainly does not look like pyoderma. The first and fourth toes look like ischemic injury. This may be microvascular but I am going to ask Dr. Gwenlyn Found to proceed with revascularization at this point I do not know that there is any other choice here. I will review the angiogram Electronic Signature(s) Signed: 04/27/2019 4:44:44 PM By: Linton Ham MD Entered By: Linton Ham on 04/27/2019 16:40:14 Nina Perez ([id number]) -------------------------------------------------------------------------------- Physical Exam Details Patient Name: Nina Perez Date of Service: 04/27/2019 3:00 PM Medical Record Number: [id number] Patient Account Number: [id number] Date of Birth/Sex: 01-29-1941 (78 y.o. F) Treating RN: Cornell Barman Primary Care Provider: Einar Pheasant Other Clinician: Referring Provider: Einar Pheasant Treating Provider/Extender: Tito Dine in Treatment: 8 Constitutional Patient is hypertensive.. Pulse regular and within target range for patient.Marland Kitchen Respirations regular, non-labored and within target range.. Temperature is normal and within the target range for the  patient.Marland Kitchen appears in no distress. Notes Wound exam; the patient's wound is on the dorsal lateral foot. Fairly easy debridement with a #3 curette removing subcutaneous debris. The wound cleans up quite nicely. May be slightly improved in terms of depth. Her first and fourth toe tips look ischemic especially the bottom of the fourth toe. Whether this is macrovascular or a microvascular problem is unclear. The microvascular problem could be related to plain atherosclerosis or a vasculopathy. Electronic Signature(s) Signed: 04/27/2019 4:44:44 PM By: Linton Ham MD Entered By: Linton Ham on 04/27/2019 16:41:50 Nina Perez ([id number]) -------------------------------------------------------------------------------- Physician Orders Details Patient Name: Nina Perez Date of Service: 04/27/2019 3:00 PM Medical Record Number: [id number] Patient Account Number: [id number] Date of Birth/Sex: [birth year] (78 y.o. F) Treating RN: Cornell Barman Primary Care Provider: Einar Pheasant Other Clinician: Referring Provider: Einar Pheasant Treating Provider/Extender: Tito Dine in Treatment: 8 Verbal / Phone Orders: No Diagnosis Coding Wound Cleansing Wound #1 Left,Lateral Foot o Clean wound with Normal Saline. o May Shower, gently pat wound dry prior to applying new dressing. Anesthetic (add to Medication List) Wound #1 Left,Lateral Foot o Topical Lidocaine 4% cream applied to wound bed prior to debridement (In Clinic Only). Primary Wound Dressing Wound #1 Left,Lateral Foot o Santyl Ointment Secondary Dressing Wound #1 Left,Lateral Foot o ABD and Kerlix/Conform Dressing Change  Frequency Wound #1 Left,Lateral Foot o Change dressing every day. Follow-up Appointments Wound #1 Left,Lateral Foot o Return Appointment in 1 week. Edema Control Wound #1 Left,Lateral Foot o Patient to wear own compression stockings o Elevate legs to the level of the heart and  pump ankles as often as possible Off-Loading Wound #1 Left,Lateral Foot o Other: - keep pressure off of wound area Additional Orders / Instructions Wound #1 Left,Lateral Foot o Activity as tolerated Medications-please add to medication list. Wound #1 Left,Lateral Foot Nina Perez, Nina Perez ([id number]) o Santyl Enzymatic Ointment Consults o Vascular - Follow-up with Vascular Electronic Signature(s) Signed: 04/27/2019 4:44:44 PM By: Linton Ham MD Signed: 04/28/2019 11:42:17 AM By: Gretta Cool, BSN, RN, CWS, Kim RN, BSN Entered By: Gretta Cool, BSN, RN, CWS, Kim on 04/27/2019 15:06:20 Nina Perez ([id number]) -------------------------------------------------------------------------------- Problem List Details Patient Name: Nina Perez Date of Service: 04/27/2019 3:00 PM Medical Record Number: [id number] Patient Account Number: [id number] Date of Birth/Sex: 01/31/1941 (78 y.o. F) Treating RN: Cornell Barman Primary Care Provider: Einar Pheasant Other Clinician: Referring Provider: Einar Pheasant Treating Provider/Extender: Tito Dine in Treatment: 8 Active Problems ICD-10 Evaluated Encounter Code Description Active Date Today Diagnosis E11.621 Type 2 diabetes mellitus with foot ulcer 03/02/2019 No Yes I87.323 Chronic venous hypertension (idiopathic) with inflammation of 03/02/2019 No Yes bilateral lower extremity L97.528 Non-pressure chronic ulcer of other part of left foot with other 03/02/2019 No Yes specified severity M05.7A Rheumatoid arthritis with rheumatoid factor of other specified 03/02/2019 No Yes site without organ or systems involvement Inactive Problems Resolved Problems Electronic Signature(s) Signed: 04/27/2019 4:44:44 PM By: Linton Ham MD Entered By: Linton Ham on 04/27/2019 16:37:00 Nina Perez ([id number]) -------------------------------------------------------------------------------- Progress Note Details Patient Name: Nina Perez Date of Service: 04/27/2019 3:00 PM Medical Record Number: [id number] Patient Account Number: [id number] Date of Birth/Sex: 04-18-1941 (78 y.o. F) Treating RN: Cornell Barman Primary Care Provider: Einar Pheasant Other Clinician: Referring Provider: Einar Pheasant Treating Provider/Extender: Tito Dine in Treatment: 8 Subjective History of Present Illness (HPI) ADMISSION 03/02/2019 Mrs. Simich is a 78 year old woman with a complicated past medical history who presents for review of wound on the left lateral foot for the last 6 weeks. She states she simply looked down and saw a black spot on the left lateral foot and thought this was dirt went to wipe it off and there was an open wound. This is not gotten any better and over the timeframe. She has been using topical triple antibiotic ointment. She has a history of chronic venous insufficiency and has had wounds on her lower legs related to this but never on her feet. She is known to Dr. Aleda Grana at [facility] in [place] and she went to see him about this. He did not order any specific tests and said that he was not exactly sure what it because this per the patient's description. She was put in an [employer] but it looked as though this was stopped on 10/8. She had a week's worth of doxycycline as well. Patient states the area is very painful rating it at a 10 out of 10. She does not really describe claudication element. 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 X-ray we ordered last week was negative for any bony abnormality The patient had venous studies on 06/16/2018 in the [facility] system. There was  no thrombosis and they did not comment on reflux. She has had previous vein surgery ABIs in this clinic were 0.96 on the right and 0.88  on the left 10/28; the patient arrives with a small punched out area on the left lateral foot. This appears somewhat better than last week but still with a nonviable surface. I did not feel when I admitted her last week that she had significant arterial disease. She definitely has chronic venous insufficiency and multiple other rheumatologic issues including rheumatoid arthritis, scleroderma crest syndrome but the exact etiology of this wound is not really clear in its location. We used Santyl 11/4; small punched out area on the left anterior lateral foot. The exact reason for this wound is not really clear. She has venous disease however I am not certain that this explains the presentation like this. She saw Dr. Aleda Grana of [facility] before she came into this clinic. She does have other possible alternatives for this wound. I do not believe she has an arterial issue however she does have rheumatoid arthritis, crest syndrome etc. I have been concerned to actually biopsy this however unless of course it worsens 11/11; small punched out area on the left anterior lateral foot. The cause of this is never really clear. She has venous disease previous venous stripping and was followed by Dr. Aleda Grana of [facility]. She saw Dr. Ozella Almond office in 2018 once. She does not want to go back there. She was not felt to have a DVT or evidence of reflux at that time. Although it was difficult to sort out I really do not believe this lady has had formal arterial studies. The area on the left lateral foot still has debris requiring debridement. This is disappointing she has been using Santyl. 11/18; small punched out area on the left lateral foot. Using Santyl and certainly the adherent debris at the base of this is a lot better. She is going for arterial studies later today at Dr. Gwenlyn Found and Dr. Cyndi Lennert office in [place]. If these show adequate blood flow I think we need to reach out to  her rheumatologist about discontinuing the methotrexate. The cause of this wound has never really been clear to me. She does however have a complicated rheumatologic history [see above] 04/06/2019 on evaluation today patient appears to be doing well at this point with regard to her wound. The size is really about the same as previously noted. With that being said this is the first time I seen her but it appears that she has a lot more slough buildup than she did last week when collagen was used. This is something that the patient's case manager nurse also stated to me as well. I really feel like the Santyl likely is going to do better for her. She does have an appointment with Dr. Edison Pace, Nadara Mode ([id number]) [facility] office on December 1. 12/2; changed back to sample last week. I looked at the picture and she had a large amount of necrotic debris in the wound surface and slough I certainly agree with this. The wound actually looks better today but still required debridement I took a call from Dr. Gwenlyn Found yesterday. He felt that the patient had a 80% left SFA stenosis although he was not at all sure that the stenosis was the cause of the patient's wound where that the nonhealing state was related to ischemia. We agreed that we would attempt ongoing wound care and that if the wound stalled  deteriorated we would consider further interventions including angiography. I should note that the patient pointed out to me today that the only time this really bothers her is when she is lying in bed and the pain is relieved by putting her leg over the edge of the bed. Unfortunately if she did not seem to tell Dr. Gwenlyn Found this 12/9; punched out area on the left lateral foot. The surface of this looks better. We have been using Santyl she has been changing the dressing. She complains of a lot of pain in the foot specifically the tips of her first and fourth toes. She shows me a small little splinter hemorrhages that  look like telangiectasias on the tip of some of her fingers. No doubt this is related to her scleroderma. As I noted on my note from last week;. Dr. Gwenlyn Found was not convinced the SFA stenosis was related to the wound on her foot. He wanted wound care to see if this would heal on its own without arterial intervention. 12/16; punched out area on the left lateral foot. Still requiring debridement. We have been using Santyl. She is increasing her complaints of pain in the left first and especially the left fourth toe. I also spoke to Dr. Jefm Bryant today. He was okay with holding the methotrexate although he made the point that methotrexate is rarely complicating wound healing at these doses. He also wondered about pyoderma and the possibility of microcalcifications from her crest syndrome. We did an x-ray of her foot there were not any comments about microcalcification. From my point of view this certainly does not look like pyoderma. The first and fourth toes look like ischemic injury. This may be microvascular but I am going to ask Dr. Gwenlyn Found to proceed with revascularization at this point I do not know that there is any other choice here. I will review the angiogram Objective Constitutional Patient is hypertensive.. Pulse regular and within target range for patient.Marland Kitchen Respirations regular, non-labored and within target range.. Temperature is normal and within the target range for the patient.Marland Kitchen appears in no distress. Vitals Time Taken: 2:45 PM, Height: 66 in, Weight: 230 lbs, BMI: 37.1, Temperature: 98.3 F, Pulse: 69 bpm, Respiratory Rate: 16 breaths/min, Blood Pressure: 160/72 mmHg. General Notes: Wound exam; the patient's wound is on the dorsal lateral foot. Fairly easy debridement with a #3 curette removing subcutaneous debris. The wound cleans up quite nicely. May be slightly improved in terms of depth. Her first and fourth toe tips look ischemic especially the bottom of the fourth toe. Whether  this is macrovascular or a microvascular problem is unclear. The microvascular problem could be related to plain atherosclerosis or a vasculopathy. Integumentary (Hair, Skin) Wound #1 status is Open. Original cause of wound was Gradually Appeared. The wound is located on the Left,Lateral Foot. The wound measures 1.6cm length x 1cm width x 0.4cm depth; 1.257cm^2 area and 0.503cm^3 volume. There is Fat Layer (Subcutaneous Tissue) Exposed exposed. There is no tunneling or undermining noted. There is a medium amount of serosanguineous drainage noted. The wound margin is flat and intact. There is small (1-33%) red granulation within the wound bed. There is a large (67-100%) amount of necrotic tissue within the wound bed including Eschar and Adherent Nina Perez, Nina Perez. ([id number]) Guion. Assessment Active Problems ICD-10 Type 2 diabetes mellitus with foot ulcer Chronic venous hypertension (idiopathic) with inflammation of bilateral lower extremity Non-pressure chronic ulcer of other part of left foot with other specified severity Rheumatoid arthritis with rheumatoid factor  of other specified site without organ or systems involvement Procedures Wound #1 Pre-procedure diagnosis of Wound #1 is a Diabetic Wound/Ulcer of the Lower Extremity located on the Left,Lateral Foot .Severity of Tissue Pre Debridement is: Fat layer exposed. There was a Excisional Skin/Subcutaneous Tissue Debridement with a total area of 1.6 sq cm performed by Ricard Dillon, MD. With the following instrument(s): Curette to remove Viable and Non-Viable tissue/material. Material removed includes Subcutaneous Tissue and Slough and after achieving pain control using Lidocaine. No specimens were taken. A time out was conducted at 14:57, prior to the start of the procedure. A Minimum amount of bleeding was controlled with Pressure. The procedure was tolerated well. Post Debridement Measurements: 1.6cm length x 1cm width x 0.4cm depth;  0.503cm^3 volume. Character of Wound/Ulcer Post Debridement is stable. Severity of Tissue Post Debridement is: Fat layer exposed. Post procedure Diagnosis Wound #1: Same as Pre-Procedure Plan Wound Cleansing: Wound #1 Left,Lateral Foot: Clean wound with Normal Saline. May Shower, gently pat wound dry prior to applying new dressing. Anesthetic (add to Medication List): Wound #1 Left,Lateral Foot: Topical Lidocaine 4% cream applied to wound bed prior to debridement (In Clinic Only). Primary Wound Dressing: Wound #1 Left,Lateral Foot: Santyl Ointment Secondary Dressing: Wound #1 Left,Lateral Foot: ABD and Kerlix/Conform Dressing Change Frequency: Wound #1 Left,Lateral Foot: Change dressing every day. Follow-up Appointments: Wound #1 Left,Lateral Foot: Nina Perez, Nina Perez ([id number]) Return Appointment in 1 week. Edema Control: Wound #1 Left,Lateral Foot: Patient to wear own compression stockings Elevate legs to the level of the heart and pump ankles as often as possible Off-Loading: Wound #1 Left,Lateral Foot: Other: - keep pressure off of wound area Additional Orders / Instructions: Wound #1 Left,Lateral Foot: Activity as tolerated Medications-please add to medication list.: Wound #1 Left,Lateral Foot: Santyl Enzymatic Ointment Consults ordered were: Vascular - Follow-up with Vascular 1. I am continuing with Santyl 2. I am going to reach out for Dr. Gwenlyn Found after reviewing her angiogram. 3. Methotrexate on hold for a month I talked to her about this in some detail. I did express Dr. Scharlene Gloss view that this is unlikely to impair wound healing and in my experience I agree with that. 4. I am reluctant to biopsy this until its been revascularized. Electronic Signature(s) Signed: 04/27/2019 4:44:44 PM By: Linton Ham MD Entered By: Linton Ham on 04/27/2019 16:43:13 Nina Perez  ([id number]) -------------------------------------------------------------------------------- [place] Details Patient Name: Nina Perez Date of Service: 04/27/2019 Medical Record Number: [id number] Patient Account Number: [id number] Date of Birth/Sex: July 15, 1940 (78 y.o. 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 M05.7A Rheumatoid arthritis with rheumatoid factor of other specified site without organ or systems involvement Facility Procedures CPT4 Code Description: [id number] - DEB SUBQ TISSUE 20 SQ CM/< ICD-10 Diagnosis Description L97.528 Non-pressure chronic ulcer of other part of left foot with other Modifier: specified sever Quantity: 1 ity Physician Procedures CPT4 Code Description: [id number] - WC PHYS SUBQ TISS 20 SQ CM ICD-10 Diagnosis Description L97.528 Non-pressure chronic ulcer of other part of left foot with other Modifier: specified severi Quantity: 1 ty Electronic Signature(s) Signed: 04/27/2019 4:44:44 PM By: Linton Ham MD Entered By: Linton Ham on 04/27/2019 16:43:34

## 2019-04-29 ENCOUNTER — Encounter: Payer: Self-pay | Admitting: Cardiovascular Disease

## 2019-04-29 ENCOUNTER — Other Ambulatory Visit: Payer: Self-pay

## 2019-04-29 ENCOUNTER — Ambulatory Visit (INDEPENDENT_AMBULATORY_CARE_PROVIDER_SITE_OTHER): Payer: Medicare Other | Admitting: Cardiovascular Disease

## 2019-04-29 VITALS — BP 114/66 | HR 70 | Ht 66.0 in | Wt 231.0 lb

## 2019-04-29 DIAGNOSIS — I739 Peripheral vascular disease, unspecified: Secondary | ICD-10-CM

## 2019-04-29 DIAGNOSIS — Z01810 Encounter for preprocedural cardiovascular examination: Secondary | ICD-10-CM

## 2019-04-29 DIAGNOSIS — S91302D Unspecified open wound, left foot, subsequent encounter: Secondary | ICD-10-CM

## 2019-04-29 DIAGNOSIS — R0989 Other specified symptoms and signs involving the circulatory and respiratory systems: Secondary | ICD-10-CM

## 2019-04-29 NOTE — Assessment & Plan Note (Signed)
Nina Perez returns today at the request of Dr. Dellia Nims for worsening pain in her left foot and discoloration of her left first and fourth toes.  He is concerned this may be related to critical limb ischemia.  She did have a Doppler study performed 03/30/2019 revealing a left ABI of 0.83 with a high-frequency signal in her mid left SFA.  We discussed therapeutic options and agreed to proceed with angiography potential intervention.

## 2019-04-29 NOTE — Progress Notes (Signed)
04/29/2019 Nina Perez   November 13, 1940  [id number]  Primary Physician Einar Pheasant, MD Primary Cardiologist: Lorretta Harp MD Nina Perez, [place]  HPI:  Nina Perez is a 78 y.o.  moderately overweight married Caucasian female mother of 2, grandmother of 2 grandchildren referred by Dr. Dellia Nims, wound care specialist, for peripheral vascular evaluation because of critical limb ischemia.  She is accompanied by her husband [name] today.    I last saw her in the office 04/12/2019 her risk factors include treated hypertension hyperlipidemia.  Her father may have died of a myocardial infarction.  She is never had a heart attack or stroke.  She denies chest pain or shortness of breath.  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  She is minimally ambulatory walks with a cane at home and in with and gets around in a wheelchair otherwise.  She does not denies claudication.  She had a wound to the lateral aspect of her left foot for approximately 3 months.  She is unaware of how this developed.  She sees Dr. Dellia Nims at the wound care clinic who debrides her on a routine basis.  The wound has been slow to heal.  She had Dopplers performed in our office 03/30/2019 revealing a left ABI of 0.83 with a high-frequency signal in the mid left SFA.  Since I saw her 2 weeks ago she developed some discoloration on her left first and fourth toes with some increasing pain.  She saw Dr. Dellia Nims back in the wound care center and he felt that she should proceed with angiography and revascularization.    Current Meds  Medication Sig  . allopurinol (ZYLOPRIM) 300 MG tablet Take 1 tablet by mouth once daily  . amLODipine (NORVASC) 10 MG tablet TAKE 1 TABLET BY MOUTH ONCE DAILY  . aspirin 81 MG tablet Take 81 mg by mouth daily. Reported on 07/24/2015  . diclofenac sodium (VOLTAREN) 1 % GEL Apply 4 g topically 4 (four) times daily.  . fluticasone  (FLONASE) 50 MCG/ACT nasal spray Place 2 sprays into the nose daily.  . folic acid (FOLVITE) 1 MG tablet Take 1 mg by mouth daily.  Marland Kitchen glucose blood (BAYER CONTOUR TEST) test strip USE  STRIP TO CHECK GLUCOSE TWICE DAILY. Dx: E11.9  . lidocaine (LIDODERM) 5 % Place 1 patch onto the skin daily. Remove & Discard patch within 12 hours.  Marland Kitchen losartan (COZAAR) 100 MG tablet Take 1 tablet by mouth once daily  . metFORMIN (GLUCOPHAGE) 500 MG tablet Take 1 tablet by mouth once daily  . methotrexate 2.5 MG tablet Take 6 tablets q week  . metoprolol succinate (TOPROL-XL) 50 MG 24 hr tablet TAKE 1 TABLET BY MOUTH ONCE DAILY IMMEDIATELY FOLLOWING A MEAL  . MICROLET LANCETS MISC Check sugar once daily, [employer] Microlet Lancets. Dx E11.9  . naproxen (NAPROSYN) 500 MG tablet Take 500 mg by mouth 2 (two) times daily as needed.  . nystatin cream (MYCOSTATIN) Apply to affected area bid  . omeprazole (PRILOSEC) 20 MG capsule Take 20 mg by mouth daily.  Marland Kitchen spironolactone (ALDACTONE) 25 MG tablet Take 1 tablet by mouth once daily  . traMADol (ULTRAM) 50 MG tablet Take 1 tablet (50 mg total) by mouth 2 (two) times daily as needed.     Allergies  Allergen Reactions  . Maxzide [Triamterene-Hctz] Other (See Comments)    Weakness/fatigue     Social History   Socioeconomic History  .  Marital status: Married    Spouse name: Not on file  . Number of children: Not on file  . Years of education: Not on file  . Highest education level: Not on file  Occupational History  . Not on file  Tobacco Use  . Smoking status: Former Smoker    Packs/day: 0.50    Years: 15.00    Pack years: 7.50    Quit date: 05/12/1988    Years since quitting: 30.9  . Smokeless tobacco: Never Used  Substance and Sexual Activity  . Alcohol use: No    Alcohol/week: 0.0 standard drinks  . Drug use: No  . Sexual activity: Yes  Other Topics Concern  . Not on file  Social History Narrative  . Not on file   Social Determinants of Health    Financial Resource Strain:   . Difficulty of Paying Living Expenses: Not on file  Food Insecurity:   . Worried About Charity fundraiser in the Last Year: Not on file  . Ran Out of Food in the Last Year: Not on file  Transportation Needs:   . Lack of Transportation (Medical): Not on file  . Lack of Transportation (Non-Medical): Not on file  Physical Activity:   . Days of Exercise per Week: Not on file  . Minutes of Exercise per Session: Not on file  Stress: No Stress Concern Present  . Feeling of Stress : Only a little  Social Connections:   . Frequency of Communication with Friends and Family: Not on file  . Frequency of Social Gatherings with Friends and Family: Not on file  . Attends Religious Services: Not on file  . Active Member of Clubs or Organizations: Not on file  . Attends Archivist Meetings: Not on file  . Marital Status: Not on file  Intimate Partner Violence:   . Fear of Current or Ex-Partner: Not on file  . Emotionally Abused: Not on file  . Physically Abused: Not on file  . Sexually Abused: Not on file     Review of Systems: General: negative for chills, fever, night sweats or weight changes.  Cardiovascular: negative for chest pain, dyspnea on exertion, edema, orthopnea, palpitations, paroxysmal nocturnal dyspnea or shortness of breath Dermatological: negative for rash Respiratory: negative for cough or wheezing Urologic: negative for hematuria Abdominal: negative for nausea, vomiting, diarrhea, bright red blood per rectum, melena, or hematemesis Neurologic: negative for visual changes, syncope, or dizziness All other systems reviewed and are otherwise negative except as noted above.    Blood pressure 114/66, pulse 70, height 5\' 6"  (1.676 m), weight 231 lb (104.8 kg), last menstrual period 05/25/1988.  General appearance: alert and no distress Neck: no adenopathy, no JVD, supple, symmetrical, trachea midline, thyroid not enlarged, symmetric, no  tenderness/mass/nodules and Soft left carotid bruit Lungs: clear to auscultation bilaterally Heart: regular rate and rhythm, S1, S2 normal, no murmur, click, rub or gallop Extremities: extremities normal, atraumatic, no cyanosis or edema Pulses: Diminished pedal pulses Skin: Purplish discoloration left first and fourth toe, wound lateral aspect of left foot Neurologic: Alert and oriented X 3, normal strength and tone. Normal symmetric reflexes. Normal coordination and gait  EKG not performed today  ASSESSMENT AND PLAN:   Open wound of left foot Ms. Foulks returns today at the request of Dr. Dellia Nims for worsening pain in her left foot and discoloration of her left first and fourth toes.  He is concerned this may be related to critical limb ischemia.  She did have a Doppler study performed 03/30/2019 revealing a left ABI of 0.83 with a high-frequency signal in her mid left SFA.  We discussed therapeutic options and agreed to proceed with angiography potential intervention.      Lorretta Harp MD FACP,FACC,FAHA, [facility] 04/29/2019 8:29 AM

## 2019-04-29 NOTE — H&P (View-Only) (Signed)
04/29/2019 Nina HUTZEL   12-23-1940  [id number]  Primary Physician Nina Pheasant, MD Primary Cardiologist: Nina Harp MD Nina Perez, [place]  HPI:  Nina Perez is a 78 y.o.  moderately overweight married Caucasian female mother of 2, grandmother of 2 grandchildren referred by Dr. Dellia Perez, wound care specialist, for peripheral vascular evaluation because of critical limb ischemia.  She is accompanied by her husband Nina Perez today.    I last saw her in the office 04/12/2019 her risk factors include treated hypertension hyperlipidemia.  Her father may have died of a myocardial infarction.  She is never had a heart attack or stroke.  She denies chest pain or shortness of breath.  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  She is minimally ambulatory walks with a cane at home and in with and gets around in a wheelchair otherwise.  She does not denies claudication.  She had a wound to the lateral aspect of her left foot for approximately 3 months.  She is unaware of how this developed.  She sees Dr. Dellia Perez at the wound care clinic who debrides her on a routine basis.  The wound has been slow to heal.  She had Dopplers performed in our office 03/30/2019 revealing a left ABI of 0.83 with a high-frequency signal in the mid left SFA.  Since I saw her 2 weeks ago she developed some discoloration on her left first and fourth toes with some increasing pain.  She saw Dr. Dellia Perez back in the wound care center and he felt that she should proceed with angiography and revascularization.    Current Meds  Medication Sig  . allopurinol (ZYLOPRIM) 300 MG tablet Take 1 tablet by mouth once daily  . amLODipine (NORVASC) 10 MG tablet TAKE 1 TABLET BY MOUTH ONCE DAILY  . aspirin 81 MG tablet Take 81 mg by mouth daily. Reported on 07/24/2015  . diclofenac sodium (VOLTAREN) 1 % GEL Apply 4 g topically 4 (four) times daily.  . fluticasone  (FLONASE) 50 MCG/ACT nasal spray Place 2 sprays into the nose daily.  . folic acid (FOLVITE) 1 MG tablet Take 1 mg by mouth daily.  Marland Kitchen glucose blood (BAYER CONTOUR TEST) test strip USE  STRIP TO CHECK GLUCOSE TWICE DAILY. Dx: E11.9  . lidocaine (LIDODERM) 5 % Place 1 patch onto the skin daily. Remove & Discard patch within 12 hours.  Marland Kitchen losartan (COZAAR) 100 MG tablet Take 1 tablet by mouth once daily  . metFORMIN (GLUCOPHAGE) 500 MG tablet Take 1 tablet by mouth once daily  . methotrexate 2.5 MG tablet Take 6 tablets q week  . metoprolol succinate (TOPROL-XL) 50 MG 24 hr tablet TAKE 1 TABLET BY MOUTH ONCE DAILY IMMEDIATELY FOLLOWING A MEAL  . MICROLET LANCETS MISC Check sugar once daily, [employer] Microlet Lancets. Dx E11.9  . naproxen (NAPROSYN) 500 MG tablet Take 500 mg by mouth 2 (two) times daily as needed.  . nystatin cream (MYCOSTATIN) Apply to affected area bid  . omeprazole (PRILOSEC) 20 MG capsule Take 20 mg by mouth daily.  Marland Kitchen spironolactone (ALDACTONE) 25 MG tablet Take 1 tablet by mouth once daily  . traMADol (ULTRAM) 50 MG tablet Take 1 tablet (50 mg total) by mouth 2 (two) times daily as needed.     Allergies  Allergen Reactions  . Maxzide [Triamterene-Hctz] Other (See Comments)    Weakness/fatigue     Social History   Socioeconomic History  .  Marital status: Married    Spouse name: Not on file  . Number of children: Not on file  . Years of education: Not on file  . Highest education level: Not on file  Occupational History  . Not on file  Tobacco Use  . Smoking status: Former Smoker    Packs/day: 0.50    Years: 15.00    Pack years: 7.50    Quit date: 05/12/1988    Years since quitting: 30.9  . Smokeless tobacco: Never Used  Substance and Sexual Activity  . Alcohol use: No    Alcohol/week: 0.0 standard drinks  . Drug use: No  . Sexual activity: Yes  Other Topics Concern  . Not on file  Social History Narrative  . Not on file   Social Determinants of Health     Financial Resource Strain:   . Difficulty of Paying Living Expenses: Not on file  Food Insecurity:   . Worried About Charity fundraiser in the Last Year: Not on file  . Ran Out of Food in the Last Year: Not on file  Transportation Needs:   . Lack of Transportation (Medical): Not on file  . Lack of Transportation (Non-Medical): Not on file  Physical Activity:   . Days of Exercise per Week: Not on file  . Minutes of Exercise per Session: Not on file  Stress: No Stress Concern Present  . Feeling of Stress : Only a little  Social Connections:   . Frequency of Communication with Friends and Family: Not on file  . Frequency of Social Gatherings with Friends and Family: Not on file  . Attends Religious Services: Not on file  . Active Member of Clubs or Organizations: Not on file  . Attends Archivist Meetings: Not on file  . Marital Status: Not on file  Intimate Partner Violence:   . Fear of Current or Ex-Partner: Not on file  . Emotionally Abused: Not on file  . Physically Abused: Not on file  . Sexually Abused: Not on file     Review of Systems: General: negative for chills, fever, night sweats or weight changes.  Cardiovascular: negative for chest pain, dyspnea on exertion, edema, orthopnea, palpitations, paroxysmal nocturnal dyspnea or shortness of breath Dermatological: negative for rash Respiratory: negative for cough or wheezing Urologic: negative for hematuria Abdominal: negative for nausea, vomiting, diarrhea, bright red blood per rectum, melena, or hematemesis Neurologic: negative for visual changes, syncope, or dizziness All other systems reviewed and are otherwise negative except as noted above.    Blood pressure 114/66, pulse 70, height 5\' 6"  (1.676 m), weight 231 lb (104.8 kg), last menstrual period 05/25/1988.  General appearance: alert and no distress Neck: no adenopathy, no JVD, supple, symmetrical, trachea midline, thyroid not enlarged, symmetric, no  tenderness/mass/nodules and Soft left carotid bruit Lungs: clear to auscultation bilaterally Heart: regular rate and rhythm, S1, S2 normal, no murmur, click, rub or gallop Extremities: extremities normal, atraumatic, no cyanosis or edema Pulses: Diminished pedal pulses Skin: Purplish discoloration left first and fourth toe, wound lateral aspect of left foot Neurologic: Alert and oriented X 3, normal strength and tone. Normal symmetric reflexes. Normal coordination and gait  EKG not performed today  ASSESSMENT AND PLAN:   Open wound of left foot Ms. Placide returns today at the request of Dr. Dellia Perez for worsening pain in her left foot and discoloration of her left first and fourth toes.  He is concerned this may be related to critical limb ischemia.  She did have a Doppler study performed 03/30/2019 revealing a left ABI of 0.83 with a high-frequency signal in her mid left SFA.  We discussed therapeutic options and agreed to proceed with angiography potential intervention.      Nina Harp MD FACP,FACC,FAHA, [facility] 04/29/2019 8:29 AM

## 2019-04-29 NOTE — Patient Instructions (Signed)
Medication Instructions:  Your physician recommends that you continue on your current medications as directed. Please refer to the Current Medication list given to you today.  *If you need a refill on your cardiac medications before your next appointment, please call your pharmacy*  Lab Work: You will need to have labs draw 1 week prior to procedure   BMET  CBC If you have labs (blood work) drawn today and your tests are completely normal, you will receive your results only by: Marland Kitchen MyChart Message (if you have MyChart) OR . A paper copy in the mail If you have any lab test that is abnormal or we need to change your treatment, we will call you to review the results.  Testing/Procedures: Your physician has requested that you have a lower extremity arterial exercise duplex. During this test, exercise and ultrasound are used to evaluate arterial blood flow in the legs. Allow one hour for this exam. There are no restrictions or special instructions.  Please schedule 1 week after PV Angio on 05/16/2019  Your physician has requested that you have a carotid duplex. This test is an ultrasound of the carotid arteries in your neck. It looks at blood flow through these arteries that supply the brain with blood. Allow one hour for this exam. There are no restrictions or special instructions.  Please schedule for 1-2 weeks   Follow-Up: At [facility], you and your health needs are our priority.  As part of our continuing mission to provide you with exceptional heart care, we have created designated Provider Care Teams.  These Care Teams include your primary Cardiologist (physician) and Advanced Practice Providers (APPs -  Physician Assistants and Nurse Practitioners) who all work together to provide you with the care you need, when you need it.  Your next appointment:   2 week(s) after PV Procedure   The format for your next appointment:   In Person  Provider:   Quay Burow, MD  Other  Instructions      [place] [address] Dept: [phone] Loc: [place]  04/29/2019  You are scheduled for a Peripheral Angiogram on Monday, January 4 with Dr. Quay Burow.  1. Please arrive at the [facility] (Main Entrance A) at [facility]: [address] [place], [place] [address] at 7:30 AM (This time is two hours before your procedure to ensure your preparation). Free valet parking service is available.   Special note: Every effort is made to have your procedure done on time. Please understand that emergencies sometimes delay scheduled procedures.  2. Diet: Do not eat solid foods after midnight.  The patient may have clear liquids until 5am upon the day of the procedure.  3. Labs: You will need to have blood drawn on Monday, December 28 at [place], [place]  Open: 8am - 5pm (Lunch 12:30 - 1:30)   Phone: [phone]. You do not need to be fasting.  4. Medication instructions in preparation for your procedure:   Contrast Allergy: No   On the morning of your procedure, take your Aspirin and any morning medicines NOT listed above.  You may use sips of water.  5. Plan for one night stay--bring personal belongings. 6. Bring a current list of your medications and current insurance cards. 7. You MUST have a responsible person to drive you home. 8. Someone MUST be with you the  first 24 hours after you arrive home or your discharge will be delayed. 9. Please wear clothes that are easy to get on and off and wear slip-on shoes.  Thank you for allowing [place] to care for you!   -- [facility]

## 2019-04-30 LAB — CBC
Hematocrit: 41 % (ref 34.0–46.6)
Hemoglobin: 13.3 g/dL (ref 11.1–15.9)
MCH: 31.5 pg (ref 26.6–33.0)
MCHC: 32.4 g/dL (ref 31.5–35.7)
MCV: 97 fL (ref 79–97)
Platelets: 157 10*3/uL (ref 150–450)
RBC: 4.22 x10E6/uL (ref 3.77–5.28)
RDW: 18.1 % — ABNORMAL HIGH (ref 11.7–15.4)
WBC: 5.2 10*3/uL (ref 3.4–10.8)

## 2019-04-30 LAB — BASIC METABOLIC PANEL
BUN/Creatinine Ratio: 15 (ref 12–28)
BUN: 19 mg/dL (ref 8–27)
CO2: 23 mmol/L (ref 20–29)
Calcium: 10.3 mg/dL (ref 8.7–10.3)
Chloride: 101 mmol/L (ref 96–106)
Creatinine, Ser: 1.31 mg/dL — ABNORMAL HIGH (ref 0.57–1.00)
GFR calc Af Amer: 45 mL/min/{1.73_m2} — ABNORMAL LOW (ref >59)
GFR calc non Af Amer: 39 mL/min/{1.73_m2} — ABNORMAL LOW (ref >59)
Glucose: 100 mg/dL — ABNORMAL HIGH (ref 65–99)
Potassium: 5 mmol/L (ref 3.5–5.2)
Sodium: 138 mmol/L (ref 134–144)

## 2019-05-02 ENCOUNTER — Other Ambulatory Visit: Payer: Self-pay | Admitting: Internal Medicine

## 2019-05-02 DIAGNOSIS — R7989 Other specified abnormal findings of blood chemistry: Secondary | ICD-10-CM

## 2019-05-02 NOTE — Progress Notes (Signed)
Order placed for f/u met b.  

## 2019-05-04 ENCOUNTER — Telehealth: Payer: Self-pay | Admitting: Internal Medicine

## 2019-05-04 ENCOUNTER — Other Ambulatory Visit: Payer: Self-pay

## 2019-05-04 ENCOUNTER — Encounter: Payer: Medicare Other | Admitting: Internal Medicine

## 2019-05-04 DIAGNOSIS — E11621 Type 2 diabetes mellitus with foot ulcer: Secondary | ICD-10-CM | POA: Diagnosis not present

## 2019-05-04 NOTE — Telephone Encounter (Signed)
Pt was returning a call about lab work. Please call patient @ [phone].

## 2019-05-04 NOTE — Telephone Encounter (Signed)
Pt returned phone call, she will being leaving house around 9am for another appt.

## 2019-05-04 NOTE — Telephone Encounter (Signed)
See result note.  

## 2019-05-09 ENCOUNTER — Telehealth: Payer: Self-pay

## 2019-05-09 NOTE — Telephone Encounter (Signed)
Called pt to go over procedure instructions. Verbalized understanding.

## 2019-05-11 ENCOUNTER — Ambulatory Visit: Payer: Medicare Other | Admitting: Internal Medicine

## 2019-05-12 ENCOUNTER — Telehealth: Payer: Self-pay

## 2019-05-12 ENCOUNTER — Other Ambulatory Visit (HOSPITAL_COMMUNITY)
Admission: RE | Admit: 2019-05-12 | Discharge: 2019-05-12 | Disposition: A | Discharge status: Home / Self Care | Payer: Medicare Other | Source: Ambulatory Visit | Attending: Cardiovascular Disease | Admitting: Cardiovascular Disease

## 2019-05-12 DIAGNOSIS — Z01812 Encounter for preprocedural laboratory examination: Secondary | ICD-10-CM | POA: Diagnosis present

## 2019-05-12 DIAGNOSIS — Z20828 Contact with and (suspected) exposure to other viral communicable diseases: Secondary | ICD-10-CM | POA: Insufficient documentation

## 2019-05-12 NOTE — Telephone Encounter (Signed)
The following instructions were reviewed with the patient. The patient has no questions at this time and verbalized understanding of the following instructions.  Pt contacted pre-catheterization scheduled at [facility] for: 9:30 am  Verified arrival time and place: [facility]) at: 7:30 am  No solid food after midnight prior to cath, clear liquids until 5 AM day of procedure.  Contrast allergy: No  Verified no diabetes medications. Patient is on diabetic medication.  AM meds can be  taken pre-cath with sip of water including: ASA 81 mg  Patient is instructed to hold Metformin and Aldactone the morning of the procedure.  Confirmed patient has responsible adult to drive home post procedure and observe 24 hours after arriving home: Patient has a responsible party to drive them home and observe them for 24 hours after the procedure.  Currently, due to Covid-19 pandemic, only one support person will be allowed with patient. Must be the same support person for that patient's entire stay, will be screened and required to wear a mask. They will be asked to wait in the waiting room for the duration of the patient's stay.  Patients are required to wear a mask when they enter the hospital.      COVID-19 Pre-Screening Questions:  . In the past 7 to 10 days have you had a cough,  shortness of breath, headache, congestion, fever (100 or greater) body aches, chills, sore throat, or sudden loss of taste or sense of smell? No . Have you been around anyone with known Covid 19? No . Have you been around anyone who is awaiting Covid 19 test results in the past 7 to 10 days? No . Have you been around anyone who has been exposed to Covid 19, or has mentioned symptoms of Covid 19 within the past 7 to 10 days? No  If you have any concerns/questions about symptoms patients report during screening (either on the phone or at threshold). Contact the provider seeing the patient  or DOD for further guidance.  If neither are available contact a member of the leadership team.

## 2019-05-13 LAB — NOVEL CORONAVIRUS, NAA (HOSP ORDER, SEND-OUT TO REF LAB; TAT 18-24 HRS): SARS-CoV-2, NAA: NOT DETECTED

## 2019-05-16 ENCOUNTER — Ambulatory Visit (HOSPITAL_COMMUNITY)
Admission: RE | Admit: 2019-05-16 | Discharge: 2019-05-17 | Disposition: A | Discharge status: Home / Self Care | Payer: Medicare Other | Attending: Cardiovascular Disease | Admitting: Cardiovascular Disease

## 2019-05-16 ENCOUNTER — Other Ambulatory Visit: Payer: Self-pay

## 2019-05-16 ENCOUNTER — Encounter (HOSPITAL_COMMUNITY)
Admission: RE | Disposition: A | Discharge status: Home / Self Care | Payer: Self-pay | Source: Home / Self Care | Attending: Cardiovascular Disease

## 2019-05-16 DIAGNOSIS — I739 Peripheral vascular disease, unspecified: Secondary | ICD-10-CM | POA: Diagnosis present

## 2019-05-16 DIAGNOSIS — M79672 Pain in left foot: Secondary | ICD-10-CM | POA: Diagnosis not present

## 2019-05-16 DIAGNOSIS — M341 CR(E)ST syndrome: Secondary | ICD-10-CM | POA: Diagnosis not present

## 2019-05-16 DIAGNOSIS — Z79899 Other long term (current) drug therapy: Secondary | ICD-10-CM | POA: Insufficient documentation

## 2019-05-16 DIAGNOSIS — I272 Pulmonary hypertension, unspecified: Secondary | ICD-10-CM | POA: Diagnosis not present

## 2019-05-16 DIAGNOSIS — Z7982 Long term (current) use of aspirin: Secondary | ICD-10-CM | POA: Diagnosis not present

## 2019-05-16 DIAGNOSIS — S91302A Unspecified open wound, left foot, initial encounter: Secondary | ICD-10-CM | POA: Diagnosis present

## 2019-05-16 DIAGNOSIS — Z791 Long term (current) use of non-steroidal anti-inflammatories (NSAID): Secondary | ICD-10-CM | POA: Diagnosis not present

## 2019-05-16 DIAGNOSIS — I10 Essential (primary) hypertension: Secondary | ICD-10-CM | POA: Insufficient documentation

## 2019-05-16 DIAGNOSIS — I998 Other disorder of circulatory system: Secondary | ICD-10-CM | POA: Diagnosis present

## 2019-05-16 DIAGNOSIS — E785 Hyperlipidemia, unspecified: Secondary | ICD-10-CM | POA: Insufficient documentation

## 2019-05-16 DIAGNOSIS — I70229 Atherosclerosis of native arteries of extremities with rest pain, unspecified extremity: Secondary | ICD-10-CM | POA: Diagnosis present

## 2019-05-16 DIAGNOSIS — M069 Rheumatoid arthritis, unspecified: Secondary | ICD-10-CM | POA: Diagnosis not present

## 2019-05-16 DIAGNOSIS — Z87891 Personal history of nicotine dependence: Secondary | ICD-10-CM | POA: Insufficient documentation

## 2019-05-16 DIAGNOSIS — I70212 Atherosclerosis of native arteries of extremities with intermittent claudication, left leg: Secondary | ICD-10-CM

## 2019-05-16 HISTORY — PX: PERIPHERAL VASCULAR BALLOON ANGIOPLASTY: CATH118281

## 2019-05-16 HISTORY — PX: ABDOMINAL AORTOGRAM W/LOWER EXTREMITY: CATH118223

## 2019-05-16 LAB — GLUCOSE, CAPILLARY
Glucose-Capillary: 78 mg/dL (ref 70–99)
Glucose-Capillary: 73 mg/dL (ref 70–99)
Glucose-Capillary: 74 mg/dL (ref 70–99)
Glucose-Capillary: 134 mg/dL — ABNORMAL HIGH (ref 70–99)
Glucose-Capillary: 81 mg/dL (ref 70–99)

## 2019-05-16 LAB — POCT ACTIVATED CLOTTING TIME: Activated Clotting Time: 274 seconds

## 2019-05-16 SURGERY — ABDOMINAL AORTOGRAM W/LOWER EXTREMITY
Anesthesia: LOCAL | Laterality: Left

## 2019-05-16 MED ORDER — ACETAMINOPHEN 325 MG PO TABS: 650.0000 mg | ORAL_TABLET | ORAL | Status: DC | PRN

## 2019-05-16 MED ORDER — ACETAMINOPHEN 325 MG PO TABS
650.0000 mg | ORAL_TABLET | Freq: Three times a day (TID) | ORAL | Status: DC
  Administered 2019-05-16 – 2019-05-17 (×3): 650 mg via ORAL
  Filled 2019-05-16 (×3): qty 2

## 2019-05-16 MED ORDER — CLOPIDOGREL BISULFATE 300 MG PO TABS
ORAL_TABLET | ORAL | Status: AC
  Filled 2019-05-16: qty 1

## 2019-05-16 MED ORDER — ATORVASTATIN CALCIUM 80 MG PO TABS
80.0000 mg | ORAL_TABLET | Freq: Every day | ORAL | Status: DC
  Administered 2019-05-16: 18:00:00 80 mg via ORAL
  Filled 2019-05-16: qty 1

## 2019-05-16 MED ORDER — HYDRALAZINE HCL 20 MG/ML IJ SOLN: 5.0000 mg | INTRAMUSCULAR | Status: DC | PRN

## 2019-05-16 MED ORDER — SODIUM CHLORIDE 0.9 % WEIGHT BASED INFUSION
3.0000 mL/kg/h | INTRAVENOUS | Status: DC
  Administered 2019-05-16: 08:00:00 3 mL/kg/h via INTRAVENOUS

## 2019-05-16 MED ORDER — IODIXANOL 320 MG/ML IV SOLN
INTRAVENOUS | Status: DC | PRN
  Administered 2019-05-16: 140 mL

## 2019-05-16 MED ORDER — MORPHINE SULFATE (PF) 2 MG/ML IV SOLN: 2.0000 mg | INTRAVENOUS | Status: DC | PRN

## 2019-05-16 MED ORDER — SODIUM CHLORIDE 0.9 % WEIGHT BASED INFUSION: 1.0000 mL/kg/h | INTRAVENOUS | Status: DC

## 2019-05-16 MED ORDER — SODIUM CHLORIDE 0.9% FLUSH: 3.0000 mL | INTRAVENOUS | Status: DC | PRN

## 2019-05-16 MED ORDER — ASPIRIN EC 81 MG PO TBEC
81.0000 mg | DELAYED_RELEASE_TABLET | Freq: Every day | ORAL | Status: DC
  Administered 2019-05-17: 11:00:00 81 mg via ORAL
  Filled 2019-05-16: qty 1

## 2019-05-16 MED ORDER — SODIUM CHLORIDE 0.9 % IV SOLN: INTRAVENOUS | Status: AC

## 2019-05-16 MED ORDER — LIDOCAINE HCL (PF) 1 % IJ SOLN
INTRAMUSCULAR | Status: DC | PRN
  Administered 2019-05-16: 28 mL

## 2019-05-16 MED ORDER — PANTOPRAZOLE SODIUM 40 MG PO TBEC
40.0000 mg | DELAYED_RELEASE_TABLET | Freq: Every day | ORAL | Status: DC
  Administered 2019-05-17: 40 mg via ORAL
  Filled 2019-05-16 (×2): qty 1

## 2019-05-16 MED ORDER — LABETALOL HCL 5 MG/ML IV SOLN: 10.0000 mg | INTRAVENOUS | Status: DC | PRN

## 2019-05-16 MED ORDER — LIDOCAINE HCL (PF) 1 % IJ SOLN
INTRAMUSCULAR | Status: AC
  Filled 2019-05-16: qty 30

## 2019-05-16 MED ORDER — METOPROLOL SUCCINATE ER 50 MG PO TB24
50.0000 mg | ORAL_TABLET | Freq: Every evening | ORAL | Status: DC
  Administered 2019-05-16: 18:00:00 50 mg via ORAL
  Filled 2019-05-16: qty 1

## 2019-05-16 MED ORDER — ONDANSETRON HCL 4 MG/2ML IJ SOLN: 4.0000 mg | Freq: Four times a day (QID) | INTRAMUSCULAR | Status: DC | PRN

## 2019-05-16 MED ORDER — HYDRALAZINE HCL 20 MG/ML IJ SOLN
INTRAMUSCULAR | Status: AC
  Filled 2019-05-16: qty 1

## 2019-05-16 MED ORDER — CLOPIDOGREL BISULFATE 300 MG PO TABS
ORAL_TABLET | ORAL | Status: DC | PRN
  Administered 2019-05-16: 300 mg via ORAL

## 2019-05-16 MED ORDER — CLOPIDOGREL BISULFATE 75 MG PO TABS: 75.0000 mg | ORAL_TABLET | Freq: Every day | ORAL | Status: DC

## 2019-05-16 MED ORDER — SPIRONOLACTONE 25 MG PO TABS
25.0000 mg | ORAL_TABLET | Freq: Every day | ORAL | Status: DC
  Administered 2019-05-17: 11:00:00 25 mg via ORAL
  Filled 2019-05-16: qty 1

## 2019-05-16 MED ORDER — SODIUM CHLORIDE 0.9% FLUSH: 3.0000 mL | Freq: Two times a day (BID) | INTRAVENOUS | Status: DC

## 2019-05-16 MED ORDER — SODIUM CHLORIDE 0.9% FLUSH
3.0000 mL | Freq: Two times a day (BID) | INTRAVENOUS | Status: DC
  Administered 2019-05-16: 3 mL via INTRAVENOUS

## 2019-05-16 MED ORDER — HEPARIN SODIUM (PORCINE) 1000 UNIT/ML IJ SOLN
INTRAMUSCULAR | Status: DC | PRN
  Administered 2019-05-16: 10000 [IU] via INTRAVENOUS

## 2019-05-16 MED ORDER — ACETAMINOPHEN ER 650 MG PO TBCR: 650.0000 mg | EXTENDED_RELEASE_TABLET | Freq: Three times a day (TID) | ORAL | Status: DC

## 2019-05-16 MED ORDER — ASPIRIN EC 81 MG PO TBEC: 81.0000 mg | DELAYED_RELEASE_TABLET | Freq: Every day | ORAL | Status: DC

## 2019-05-16 MED ORDER — LOSARTAN POTASSIUM 50 MG PO TABS
100.0000 mg | ORAL_TABLET | Freq: Every day | ORAL | Status: DC
  Administered 2019-05-17: 11:00:00 100 mg via ORAL
  Filled 2019-05-16 (×2): qty 2

## 2019-05-16 MED ORDER — AMLODIPINE BESYLATE 10 MG PO TABS
10.0000 mg | ORAL_TABLET | Freq: Every day | ORAL | Status: DC
  Administered 2019-05-17: 11:00:00 10 mg via ORAL
  Filled 2019-05-16: qty 1

## 2019-05-16 MED ORDER — HEPARIN (PORCINE) IN NACL 1000-0.9 UT/500ML-% IV SOLN
INTRAVENOUS | Status: DC | PRN
  Administered 2019-05-16 (×2): 500 mL

## 2019-05-16 MED ORDER — ATORVASTATIN CALCIUM 80 MG PO TABS: 80.0000 mg | ORAL_TABLET | Freq: Every day | ORAL | Status: DC

## 2019-05-16 MED ORDER — MIDAZOLAM HCL 2 MG/2ML IJ SOLN
INTRAMUSCULAR | Status: DC | PRN
  Administered 2019-05-16: 1 mg via INTRAVENOUS

## 2019-05-16 MED ORDER — FENTANYL CITRATE (PF) 100 MCG/2ML IJ SOLN
INTRAMUSCULAR | Status: AC
  Filled 2019-05-16: qty 2

## 2019-05-16 MED ORDER — TRAMADOL HCL 50 MG PO TABS: 50.0000 mg | ORAL_TABLET | Freq: Two times a day (BID) | ORAL | Status: DC | PRN

## 2019-05-16 MED ORDER — METHOTREXATE 2.5 MG PO TABS: 10.0000 mg | ORAL_TABLET | ORAL | Status: DC

## 2019-05-16 MED ORDER — SODIUM CHLORIDE 0.9 % IV SOLN: 250.0000 mL | INTRAVENOUS | Status: DC | PRN

## 2019-05-16 MED ORDER — FENTANYL CITRATE (PF) 100 MCG/2ML IJ SOLN
INTRAMUSCULAR | Status: DC | PRN
  Administered 2019-05-16 (×3): 25 ug via INTRAVENOUS

## 2019-05-16 MED ORDER — HEPARIN (PORCINE) IN NACL 1000-0.9 UT/500ML-% IV SOLN
INTRAVENOUS | Status: AC
  Filled 2019-05-16: qty 1000

## 2019-05-16 MED ORDER — CLOPIDOGREL BISULFATE 75 MG PO TABS
75.0000 mg | ORAL_TABLET | Freq: Every day | ORAL | Status: DC
  Administered 2019-05-17: 11:00:00 75 mg via ORAL
  Filled 2019-05-16: qty 1

## 2019-05-16 MED ORDER — ASPIRIN 81 MG PO CHEW
81.0000 mg | CHEWABLE_TABLET | ORAL | Status: DC
  Filled 2019-05-16: qty 1

## 2019-05-16 MED ORDER — MIDAZOLAM HCL 2 MG/2ML IJ SOLN
INTRAMUSCULAR | Status: AC
  Filled 2019-05-16: qty 2

## 2019-05-16 MED ORDER — HYDRALAZINE HCL 20 MG/ML IJ SOLN
INTRAMUSCULAR | Status: DC | PRN
  Administered 2019-05-16: 10 mg via INTRAVENOUS

## 2019-05-16 MED ORDER — HEPARIN SODIUM (PORCINE) 1000 UNIT/ML IJ SOLN
INTRAMUSCULAR | Status: AC
  Filled 2019-05-16: qty 1

## 2019-05-16 SURGICAL SUPPLY — 24 items
BALLN CHOCOLATE 5.0X40X120 (BALLOONS) ×2
BALLN IN.PACT DCB 6X60 (BALLOONS) ×2
BALLOON CHOCOLATE 5.0X40X120 (BALLOONS) ×1 IMPLANT
CATH ANGIO 5F PIGTAIL 65CM (CATHETERS) ×2 IMPLANT
CATH CROSS OVER TEMPO 5F (CATHETERS) ×2 IMPLANT
CATH STRAIGHT 5FR 65CM (CATHETERS) ×2 IMPLANT
CLOSURE MYNX CONTROL 6F/7F (Vascular Products) ×2 IMPLANT
DCB IN.PACT 6X60 (BALLOONS) ×1 IMPLANT
HOVERMATT SINGLE USE (MISCELLANEOUS) ×2 IMPLANT
KIT ENCORE 26 ADVANTAGE (KITS) ×2 IMPLANT
KIT PV (KITS) ×2 IMPLANT
SHEATH FLEX ANSEL ST 6FR 45CM (SHEATH) ×2 IMPLANT
SHEATH PINNACLE 5F 10CM (SHEATH) ×2 IMPLANT
SHEATH PINNACLE 6F 10CM (SHEATH) ×2 IMPLANT
SHEATH PROBE COVER 6X72 (BAG) ×2 IMPLANT
STOPCOCK MORSE 400PSI 3WAY (MISCELLANEOUS) ×2 IMPLANT
SYR MEDRAD MARK 7 150ML (SYRINGE) ×2 IMPLANT
TAPE VIPERTRACK RADIOPAQ (MISCELLANEOUS) ×1 IMPLANT
TAPE VIPERTRACK RADIOPAQUE (MISCELLANEOUS) ×1
TRANSDUCER W/STOPCOCK (MISCELLANEOUS) ×2 IMPLANT
TRAY PV CATH (CUSTOM PROCEDURE TRAY) ×2 IMPLANT
TUBING CIL FLEX 10 FLL-RA (TUBING) ×2 IMPLANT
WIRE HITORQ VERSACORE ST 145CM (WIRE) ×2 IMPLANT
WIRE SPARTACORE .014X300CM (WIRE) ×2 IMPLANT

## 2019-05-16 NOTE — Discharge Summary (Addendum)
Discharge Summary    Patient ID: Nina Perez,  MRN: [id number], DOB/AGE: 78-Sep-1942 78 y.o.  Admit date: 05/16/2019  Discharge date: 05/17/2019  Primary Care Provider: Einar Pheasant Primary Cardiologist: Quay Burow, MD  Discharge Diagnoses    Active Problems:   Open wound of left foot   Critical lower limb ischemia   Allergies Allergies  Allergen Reactions  . Maxzide [Triamterene-Hctz] Other (See Comments)    Weakness/fatigue     Diagnostic Studies/Procedures    PV angiogram: 05/16/19   Final Impression: Successful chocolate balloon followed by DCB of mid left SFA lesion corresponding to high-frequency signal in the mid left SFA by duplex ultrasound the setting of critical limb ischemia.  Angiographically this appeared to be at most 60%, more by duplex ultrasound with a collateral to came out just proximal to it suggesting hemodynamic significance.  There was no entrance to the AT or PT making antegrade access to the tibial vessels not possible.  Given her chronic renal insufficiency and the fact we used 140 cc of contrast that did not think it was reasonable to pursue an tibial pedal access at this time.  We will follow her clinically.  If she does not improve she may need dorsalis pedal access with potential recanalization retrograde of her anterior tibial artery.  The sheath was removed across the bifurcation and exchanged over an 035 wire for a short 6 [place] sheath.  A femoral angiogram was performed and a 6/7 MYNX closure device was successfully deployed.  The patient left lab in stable condition.  She will be hydrated overnight, discharged home the morning.  We will get Dopplers in our [facility] line office next week and I will see her back the week after.    Quay Burow. MD, [facility] 05/16/2019 11:59 AM _____________   History of Present Illness     Nina Perez is a 79 y.o.  who was referred to Dr. Gwenlyn Found for peripheral vascular evaluation because of critical limb  ischemia. She was seen in the office on 04/12/2019 by Dr. Gwenlyn Found. Her risk factors include treated hypertension hyperlipidemia. Her father may have died of a myocardial infarction. She had never had a heart attack or stroke. She denied chest pain or shortness of breath. 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 She is minimally ambulatory walks with a cane at home and gets around in a wheelchair otherwise. She had a wound to the lateral aspect of her left foot for approximately 3 months. She was unaware of how this developed. She sees Dr. Dellia Nims at the wound care clinic who debrides her on a routine basis. The wound has been slow to heal. She had Dopplers performed in our office 03/30/2019 revealing a left ABI of 0.83 with a high-frequency signal in the mid left SFA.  Since last being seen in the office she developed some discoloration on her left first and fourth toes with some increasing pain.  She saw Dr. Dellia Nims back in the wound care center and he felt that she should proceed with angiography and revascularization.   Hospital Course     She underwent successful balloon angioplasty followed by drug coated balloon of a mid SFA lesion. Per Dr. Gwenlyn Found if she does not have improvement she may need dorsalis pedal access with potential recanalization retrograde of her anterior tibial artery. She is treated with DAPT ASA/plavix for at least one year. No complications noted post cath. Morning labs were  stable. Able to ambulate without issues. Lipitor 69m daily added this admission. Switched from prilosec to protonix prior to discharge.   General: Well developed, well nourished, female appearing in no acute distress. Head: Normocephalic, atraumatic.  Neck: Supple without bruits, JVD. Lungs:  Resp regular and unlabored, CTA. Heart: RRR, S1, S2, no S3, S4, or murmur; no rub. Abdomen: Soft, non-tender, non-distended with normoactive bowel  sounds. No hepatomegaly. No rebound/guarding. No obvious abdominal masses. Extremities: No clubbing, cyanosis, edema. Distal pedal pulses are 2+ bilaterally. Right femoral cath site stable without bruising or hematoma Neuro: Alert and oriented X 3. Moves all extremities spontaneously. Psych: Normal affect.  JCARINNE BRANDENBURGERwas seen by Dr. MAngelena Formand determined stable for discharge home. Follow up in the office has been arranged. Medications are listed below.   _____________  Discharge Vitals Blood pressure (!) 172/81, pulse 66, temperature 98.2 F (36.8 C), temperature source Oral, resp. rate 18, height _0  (1.676 m), weight 106.6 kg, last menstrual period 05/25/1988, SpO2 91 %.  Filed Weights   05/16/19 0742  Weight: 106.6 kg    Labs & Radiologic Studies    CBC Recent Labs    05/17/19 0327  WBC 4.9  HGB 12.6  HCT 40.0  MCV 98.3  PLT 1630   Basic Metabolic Panel Recent Labs    05/17/19 0327  NA 139  K 4.3  CL 107  CO2 23  GLUCOSE 86  BUN 15  CREATININE 0.95  CALCIUM 9.6   Liver Function Tests No results for input(s): AST, ALT, ALKPHOS, BILITOT, PROT, ALBUMIN in the last 72 hours. No results for input(s): LIPASE, AMYLASE in the last 72 hours. Cardiac Enzymes No results for input(s): CKTOTAL, CKMB, CKMBINDEX, TROPONINI in the last 72 hours. BNP Invalid input(s): POCBNP D-Dimer No results for input(s): DDIMER in the last 72 hours. Hemoglobin A1C No results for input(s): HGBA1C in the last 72 hours. Fasting Lipid Panel No results for input(s): CHOL, HDL, LDLCALC, TRIG, CHOLHDL, LDLDIRECT in the last 72 hours. Thyroid Function Tests No results for input(s): TSH, T4TOTAL, T3FREE, THYROIDAB in the last 72 hours.  Invalid input(s): FREET3 _____________  PERIPHERAL VASCULAR CATHETERIZATION  Result Date: 05/16/2019  [id number]:  [facility] JQuay Burow M.D. 11943/11/24DATE OF PROCEDURE:  05/16/2019 DATE OF DISCHARGE: PV Angiogram/Intervention  History obtained from chart review.Nina ERTLis a 79y.o.  moderately overweight married Caucasian female mother of 2, grandmother of 2 grandchildren referred by Dr. RDellia Nims wound care specialist, for peripheral vascular evaluation because of critical limb ischemia. She is accompanied by her husband RJori Molltoday.   I last saw her in the office 04/12/2019 her risk factors include treated hypertension hyperlipidemia. Her father may have died of a myocardial infarction. She is never had a heart attack or stroke. She denies chest pain or shortness of breath. 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 She is minimally ambulatory walks with a cane at home and in with and gets around in a wheelchair otherwise. She does not denies claudication. She had a wound to the lateral aspect of her left foot for approximately 3 months. She is unaware of how this developed. She sees Dr. RDellia Nimsat the wound care clinic who debrides her on a routine basis. The wound has been slow to heal. She had Dopplers performed in our office 03/30/2019 revealing a left ABI of 0.83 with a high-frequency signal in the mid left SFA.  Since I  saw her 2 weeks ago she developed some discoloration on her left first and fourth toes with some increasing pain.  She saw Dr. Dellia Nims back in the wound care center and he felt that she should proceed with angiography and revascularization.  Pre Procedure Diagnosis: Critical limb ischemia Post Procedure Diagnosis: Critical limb ischemia Operators: Dr. Quay Burow Procedures Performed:  1.  Right common femoral access  2.  Abdominal aortogram/bilateral iliac angiogram  3.  Contralateral access (secondary cath placement)  4.  Chocolate balloon angioplasty followed by drug-coated balloon angioplasty mid left SFA PROCEDURE DESCRIPTION: The patient was brought to the second floor [facility] cath lab in the the postabsorptive state.  She was premedicated with IV Versed and fentanyl. Her right groin was prepped and shaved in usual sterile fashion. Xylocaine 1% was used for local anesthesia. A 5 French sheath was inserted into the right common femoral artery using standard Seldinger technique.  A 5 French pigtail catheters placed in the distal abdominal aorta.  Distal abdominal aortography, bilateral iliac angiography was performed.  Contralateral access was then obtained with a crossover catheter and endhole catheter.  Left lower extremity angiography runoff was performed using bolus chase, digital subtraction and step table technique.  Visipaque dye was used for the entirety of the case.  Retrograde or pressures monitored to the case.  Angiographic Data: 1: Abdominal aorta-renal arteries widely patent.  The infrarenal abdominal aorta was free of significant atherosclerotic changes.  Both iliac arteries appeared normal 2: Left lower extremity-there was a 60% hypodense lesion mid left SFA with one-vessel runoff via the peroneal.  The peroneal gave collaterals to the dorsalis pedis to the level of the ankle.   Ms. Riebe has a moderate lesion in the mid left SFA that was significant by duplex ultrasound with one-vessel runoff via the peroneal which gives collaterals to the dorsalis pedis at the level of the ankle.  We will proceed with chocolate balloon angioplasty followed by [facility] of the mid left SFA.  She does have chronic renal insufficiency with a creatinine clearance of 35 to 40 cc/min. Procedure Description: The patient received 10 calciums of heparin with an ACT of 274.  Total contrast administered to the patient was 140 cc.  Using a 0.14 [place] core wire across the mid left SFA lesion and performed chocolate balloon angioplasty with a 5 mm x 4 cm chocolate balloon at nominal pressures.  Following this, I performed DCB with a 6 mm x 6 cm impact Admiral balloon at 4 atm for 20 half minutes resulting reduction with 60% hypodense mid left SFA  stenosis to 0% residual.  There is no dissection.  There is excellent flow.  I was unable to determine where the origin of the AT and PT were. Final Impression: Successful chocolate balloon followed by DCB of mid left SFA lesion corresponding to high-frequency signal in the mid left SFA by duplex ultrasound the setting of critical limb ischemia.  Angiographically this appeared to be at most 60%, more by duplex ultrasound with a collateral to came out just proximal to it suggesting hemodynamic significance.  There was no entrance to the AT or PT making antegrade access to the tibial vessels not possible.  Given her chronic renal insufficiency and the fact we used 140 cc of contrast that did not think it was reasonable to pursue an tibial pedal access at this time.  We will follow her clinically.  If she does not improve she may need dorsalis pedal access with  potential recanalization retrograde of her anterior tibial artery.  The sheath was removed across the bifurcation and exchanged over an 035 wire for a short 6 [place] sheath.  A femoral angiogram was performed and a 6/7 MYNX closure device was successfully deployed.  The patient left lab in stable condition.  She will be hydrated overnight, discharged home the morning.  We will get Dopplers in our [facility] line office next week and I will see her back the week after. Quay Burow. MD, [facility] 05/16/2019 11:59 AM   Disposition   Pt is being discharged home today in good condition.  Follow-up Plans & Appointments    Follow-up Information    Lorretta Harp, MD Follow up on 05/31/2019.   Specialties: Cardiology, Radiology Why: at 10am for your follow up dopplers Contact information: [address] [place] [address] [place] [facility] Follow up on 05/24/2019.   Specialty: Cardiology Why: at [place] for your follow up dopplers Contact information: [address] [place]  [place] [id number]         Discharge Instructions    Diet - low sodium heart healthy   Complete by: As directed    Discharge instructions   Complete by: As directed    Groin Site Care Refer to this sheet in the next few weeks. These instructions provide you with information on caring for yourself after your procedure. Your caregiver may also give you more specific instructions. Your treatment has been planned according to current medical practices, but problems sometimes occur. Call your caregiver if you have any problems or questions after your procedure. HOME CARE INSTRUCTIONS You may shower 24 hours after the procedure. Remove the bandage (dressing) and gently wash the site with plain soap and water. Gently pat the site dry.  Do not apply powder or lotion to the site.  Do not sit in a bathtub, swimming pool, or whirlpool for 5 to 7 days.  No bending, squatting, or lifting anything over 10 pounds (4.5 kg) as directed by your caregiver.  Inspect the site at least twice daily.  Do not drive home if you are discharged the same day of the procedure. Have someone else drive you.  You may drive 24 hours after the procedure unless otherwise instructed by your caregiver.  What to expect: Any bruising will usually fade within 1 to 2 weeks.  Blood that collects in the tissue (hematoma) may be painful to the touch. It should usually decrease in size and tenderness within 1 to 2 weeks.  SEEK IMMEDIATE MEDICAL CARE IF: You have unusual pain at the groin site or down the affected leg.  You have redness, warmth, swelling, or pain at the groin site.  You have drainage (other than a small amount of blood on the dressing).  You have chills.  You have a fever or persistent symptoms for more than 72 hours.  You have a fever and your symptoms suddenly get worse.  Your leg becomes pale, cool, tingly, or numb.  You have heavy bleeding from the site. Hold pressure on the site. Marland Kitchen  PLEASE DO NOT MISS ANY  DOSES OF YOUR PLAVIX!!!!! Also keep a log of you blood pressures and bring back to your follow up appt. Please call the office with any questions.   Patients taking blood thinners should generally stay away from medicines like ibuprofen, Advil, Motrin, naproxen, and Aleve due to risk of stomach bleeding. You  may take Tylenol as directed or talk to your primary doctor about alternatives.  Some studies suggest Prilosec/Omeprazole interacts with Plavix. We changed your Prilosec/Omeprazole to the equivalent dose of Protonix for less chance of interaction.   Increase activity slowly   Complete by: As directed        Discharge Medications     Medication List    STOP taking these medications   diclofenac sodium 1 % Gel Commonly known as: VOLTAREN   omeprazole 20 MG capsule Commonly known as: PRILOSEC Replaced by: pantoprazole 40 MG tablet     TAKE these medications   acetaminophen 650 MG CR tablet Commonly known as: TYLENOL Take 650-1,300 mg by mouth every 8 (eight) hours as needed for pain.   allopurinol 300 MG tablet Commonly known as: ZYLOPRIM Take 1 tablet by mouth once daily   amLODipine 10 MG tablet Commonly known as: NORVASC TAKE 1 TABLET BY MOUTH ONCE DAILY   aspirin 81 MG tablet Take 81 mg by mouth daily. Reported on 07/24/2015   atorvastatin 80 MG tablet Commonly known as: LIPITOR Take 1 tablet (80 mg total) by mouth daily at 6 PM.   clopidogrel 75 MG tablet Commonly known as: PLAVIX Take 1 tablet (75 mg total) by mouth daily with breakfast.   fluticasone 50 MCG/ACT nasal spray Commonly known as: FLONASE Place 2 sprays into the nose daily.   folic acid 1 MG tablet Commonly known as: FOLVITE Take 1 mg by mouth daily.   glucose blood test strip Commonly known as: Estate manager/land agent USE  STRIP TO CHECK GLUCOSE TWICE DAILY. Dx: E11.9   losartan 100 MG tablet Commonly known as: COZAAR Take 1 tablet by mouth once daily   metFORMIN 500 MG tablet Commonly  known as: GLUCOPHAGE Take 1 tablet by mouth once daily What changed:   when to take this  additional instructions   methotrexate 2.5 MG tablet Take 10 mg by mouth every Wednesday.   metoprolol succinate 50 MG 24 hr tablet Commonly known as: TOPROL-XL TAKE 1 TABLET BY MOUTH ONCE DAILY IMMEDIATELY FOLLOWING A MEAL What changed: See the new instructions.   Microlet Lancets Misc Check sugar once daily, [employer] Microlet Lancets. Dx E11.9   pantoprazole 40 MG tablet Commonly known as: PROTONIX Take 1 tablet (40 mg total) by mouth daily. Replaces: omeprazole 20 MG capsule   spironolactone 25 MG tablet Commonly known as: ALDACTONE Take 1 tablet by mouth once daily   traMADol 50 MG tablet Commonly known as: ULTRAM Take 1 tablet (50 mg total) by mouth 2 (two) times daily as needed. What changed: reasons to take this       No                               Did the patient have a percutaneous coronary intervention (stent / angioplasty)?:  No.      Outstanding Labs/Studies   FLP/LFTs in 8 weeks. Follow up dopplers.   Duration of Discharge Encounter   Greater than 30 minutes including physician time.  Signed, Reino Bellis NP-C 05/17/2019, 8:05 AM   I have personally seen and examined this patient. I agree with the assessment and plan as outlined above.  Pt doing well post PV intervention. Discharge today on ASA and Plavix.   Lauree Chandler 05/17/2019 9:08 AM

## 2019-05-16 NOTE — Interval H&P Note (Signed)
History and Physical Interval Note:  05/16/2019 10:25 AM  Nina Perez  has presented today for surgery, with the diagnosis of PAD.  The various methods of treatment have been discussed with the patient and family. After consideration of risks, benefits and other options for treatment, the patient has consented to  Procedure(s): ABDOMINAL AORTOGRAM W/LOWER EXTREMITY (Left) as a surgical intervention.  The patient's history has been reviewed, patient examined, no change in status, stable for surgery.  I have reviewed the patient's chart and labs.  Questions were answered to the patient's satisfaction.     Quay Burow

## 2019-05-16 NOTE — Progress Notes (Addendum)
Pt has wound to left toe and top of left foot. Pt changed dressing this am and is refusing to let me remove dressing due to pain and she is being discharged on 05/17/19. Cont to monitor. Carroll Kinds RN

## 2019-05-17 ENCOUNTER — Telehealth (HOSPITAL_COMMUNITY): Payer: Self-pay

## 2019-05-17 DIAGNOSIS — I739 Peripheral vascular disease, unspecified: Secondary | ICD-10-CM | POA: Diagnosis not present

## 2019-05-17 DIAGNOSIS — I998 Other disorder of circulatory system: Secondary | ICD-10-CM | POA: Diagnosis not present

## 2019-05-17 LAB — BASIC METABOLIC PANEL
Anion gap: 9 (ref 5–15)
BUN: 15 mg/dL (ref 8–23)
CO2: 23 mmol/L (ref 22–32)
Calcium: 9.6 mg/dL (ref 8.9–10.3)
Chloride: 107 mmol/L (ref 98–111)
Creatinine, Ser: 0.95 mg/dL (ref 0.44–1.00)
GFR calc Af Amer: >60 mL/min (ref >60)
GFR calc non Af Amer: 58 mL/min — ABNORMAL LOW (ref >60)
Glucose, Bld: 86 mg/dL (ref 70–99)
Potassium: 4.3 mmol/L (ref 3.5–5.1)
Sodium: 139 mmol/L (ref 135–145)

## 2019-05-17 LAB — CBC
HCT: 40 % (ref 36.0–46.0)
Hemoglobin: 12.6 g/dL (ref 12.0–15.0)
MCH: 31 pg (ref 26.0–34.0)
MCHC: 31.5 g/dL (ref 30.0–36.0)
MCV: 98.3 fL (ref 80.0–100.0)
Platelets: 128 10*3/uL — ABNORMAL LOW (ref 150–400)
RBC: 4.07 MIL/uL (ref 3.87–5.11)
RDW: 17.6 % — ABNORMAL HIGH (ref 11.5–15.5)
WBC: 4.9 10*3/uL (ref 4.0–10.5)
nRBC: 0 % (ref 0.0–0.2)

## 2019-05-17 LAB — GLUCOSE, CAPILLARY
Glucose-Capillary: 75 mg/dL (ref 70–99)
Glucose-Capillary: 95 mg/dL (ref 70–99)

## 2019-05-17 MED ORDER — ATORVASTATIN CALCIUM 80 MG PO TABS: 80.0000 mg | ORAL_TABLET | Freq: Every day | ORAL | 1 refills | Status: DC

## 2019-05-17 MED ORDER — PANTOPRAZOLE SODIUM 40 MG PO TBEC: 40.0000 mg | DELAYED_RELEASE_TABLET | Freq: Every day | ORAL | 1 refills | Status: DC

## 2019-05-17 MED ORDER — CLOPIDOGREL BISULFATE 75 MG PO TABS: 75.0000 mg | ORAL_TABLET | Freq: Every day | ORAL | 2 refills | Status: DC

## 2019-05-17 NOTE — Plan of Care (Signed)
  Problem: Education: Goal: Knowledge of General Education information will improve Description: Including pain rating scale, medication(s)/side effects and non-pharmacologic comfort measures Outcome: Progressing   Problem: Clinical Measurements: Goal: Ability to maintain clinical measurements within normal limits will improve Outcome: Progressing Goal: Diagnostic test results will improve Outcome: Progressing Goal: Cardiovascular complication will be avoided Outcome: Progressing   Problem: Activity: Goal: Risk for activity intolerance will decrease Outcome: Progressing   Problem: Pain Managment: Goal: General experience of comfort will improve Outcome: Progressing   Problem: Nutrition: Goal: Adequate nutrition will be maintained Outcome: Adequate for Discharge

## 2019-05-17 NOTE — Telephone Encounter (Signed)
PV Navigator consult per Dr Gwenlyn Found request. Chart reviewed and patient contacted via telephone.  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  She reports she is doing fine, no signs of bleeding at groin incision. She says she feels very comfortable to be home and that she was well taken care of during her hospital stay. She verbalized that she has been educated regarding s/s bleeding to watch for and instructed to seek immediate medical attention should she observe these things.  She does have post hospitalization  prescriptions at her preferred pharmacy that her husband will pick up later this afternoon. She denies any barriers to care at this time and is aware of upcoming scheduled appointments with Dr Dellia Nims at the wound clinic 05/18/2019 and outpatient studies at the [facility] cardiology office on 05/24/2019. She did voice that she may contact [facility] office to re-schedule times of appointments due to driving distance from [place].  No other questions/concerns voiced at this time. Left patient my contact information should any questions or barriers arise. She wrote down my name and mobile number and expressed her appreciation.  Thank you, Cletis Media RN BSN [facility] [place]

## 2019-05-18 ENCOUNTER — Encounter: Payer: Medicare Other | Attending: Internal Medicine | Admitting: Internal Medicine

## 2019-05-18 ENCOUNTER — Other Ambulatory Visit: Payer: Self-pay

## 2019-05-18 DIAGNOSIS — E11621 Type 2 diabetes mellitus with foot ulcer: Secondary | ICD-10-CM | POA: Insufficient documentation

## 2019-05-18 DIAGNOSIS — I10 Essential (primary) hypertension: Secondary | ICD-10-CM | POA: Diagnosis not present

## 2019-05-18 DIAGNOSIS — M057A Rheumatoid arthritis with rheumatoid factor of other specified site without organ or systems involvement: Secondary | ICD-10-CM | POA: Insufficient documentation

## 2019-05-18 DIAGNOSIS — L97528 Non-pressure chronic ulcer of other part of left foot with other specified severity: Secondary | ICD-10-CM | POA: Diagnosis present

## 2019-05-18 DIAGNOSIS — J9611 Chronic respiratory failure with hypoxia: Secondary | ICD-10-CM | POA: Diagnosis not present

## 2019-05-18 DIAGNOSIS — I272 Pulmonary hypertension, unspecified: Secondary | ICD-10-CM | POA: Diagnosis not present

## 2019-05-18 DIAGNOSIS — I87323 Chronic venous hypertension (idiopathic) with inflammation of bilateral lower extremity: Secondary | ICD-10-CM | POA: Diagnosis not present

## 2019-05-18 DIAGNOSIS — Z7984 Long term (current) use of oral hypoglycemic drugs: Secondary | ICD-10-CM | POA: Diagnosis not present

## 2019-05-18 DIAGNOSIS — I872 Venous insufficiency (chronic) (peripheral): Secondary | ICD-10-CM | POA: Insufficient documentation

## 2019-05-18 DIAGNOSIS — E1152 Type 2 diabetes mellitus with diabetic peripheral angiopathy with gangrene: Secondary | ICD-10-CM | POA: Diagnosis not present

## 2019-05-18 DIAGNOSIS — Z85828 Personal history of other malignant neoplasm of skin: Secondary | ICD-10-CM | POA: Insufficient documentation

## 2019-05-18 DIAGNOSIS — M341 CR(E)ST syndrome: Secondary | ICD-10-CM | POA: Diagnosis not present

## 2019-05-18 NOTE — Progress Notes (Signed)
Nina, Perez ([id number]) Visit Report for 05/18/2019 Debridement Details Patient Name: Nina, Perez Date of Service: 05/18/2019 10:45 AM Medical Record Number: [id number] Patient Account Number: [id number] Date of Birth/Sex: 1940/06/28 (79 y.o. F) Treating RN: Cornell Barman Primary Care Provider: Einar Pheasant Other Clinician: Referring Provider: Einar Pheasant Treating Provider/Extender: Tito Dine in Treatment: 11 Debridement Performed for Wound #1 Left,Lateral Foot Assessment: Performed By: Physician Ricard Dillon, MD Debridement Type: Chemical/Enzymatic/Mechanical Agent Used: Santyl Severity of Tissue Pre Fat layer exposed Debridement: Level of Consciousness (Pre- Awake and Alert procedure): Pre-procedure Verification/Time Yes - 11:06 Out Taken: Start Time: 11:06 Pain Control: Lidocaine Instrument: Other : tongue blade Bleeding: None End Time: 11:08 Response to Treatment: Procedure was tolerated well Level of Consciousness Awake and Alert (Post-procedure): Post Debridement Measurements of Total Wound Length: (cm) 1.5 Width: (cm) 1 Depth: (cm) 0.2 Volume: (cm) 0.236 Character of Wound/Ulcer Post Debridement: Stable Severity of Tissue Post Debridement: Fat layer exposed Post Procedure Diagnosis Same as Pre-procedure Electronic Signature(s) Signed: 05/18/2019 4:45:14 PM By: Linton Ham MD Signed: 05/18/2019 4:51:02 PM By: Gretta Cool, BSN, RN, CWS, Kim RN, BSN Entered By: Gretta Cool, BSN, RN, CWS, Kim on 05/18/2019 11:07:43 Nina Perez ([id number]) -------------------------------------------------------------------------------- HPI Details Patient Name: Nina Perez Date of Service: 05/18/2019 10:45 AM Medical Record Number: [id number] Patient Account Number: [id number] Date of Birth/Sex: 1940/10/29 (79 y.o. F) Treating RN: Cornell Barman Primary Care Provider: Einar Pheasant Other Clinician: Referring Provider: Einar Pheasant Treating  Provider/Extender: Tito Dine in Treatment: 11 History of Present Illness HPI Description: ADMISSION 03/02/2019 Nina Perez is a 79 year old woman with a complicated past medical history who presents for review of wound on the left lateral foot for the last 6 weeks. She states she simply looked down and saw a black spot on the left lateral foot and thought this was dirt went to wipe it off and there was an open wound. This is not gotten any better and over the timeframe. She has been using topical triple antibiotic ointment. She has a history of chronic venous insufficiency and has had wounds on her lower legs related to this but never on her feet. She is known to Dr. Aleda Grana at [facility] in [place] and she went to see him about this. He did not order any specific tests and said that he was not exactly sure what it because this per the patient's description. She was put in an [employer] but it looked as though this was stopped on 10/8. She had a week's worth of doxycycline as well. Patient states the area is very painful rating it at a 10 out of 10. She does not really describe claudication element. 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 X-ray we ordered last week was negative for any bony abnormality The patient had venous studies on 06/16/2018 in the [facility] system. There was no thrombosis and they did not comment on reflux. She has had previous vein surgery ABIs in this clinic were 0.96 on the right and 0.88 on the left 10/28; the patient arrives with a small punched out area on the left lateral foot. This appears somewhat better than last week but still with a nonviable surface. I did not feel when I admitted her last week that she had significant arterial disease. She definitely  has chronic venous insufficiency and  multiple other rheumatologic issues including rheumatoid arthritis, scleroderma crest syndrome but the exact etiology of this wound is not really clear in its location. We used Santyl 11/4; small punched out area on the left anterior lateral foot. The exact reason for this wound is not really clear. She has venous disease however I am not certain that this explains the presentation like this. She saw Dr. Aleda Grana of [facility] before she came into this clinic. She does have other possible alternatives for this wound. I do not believe she has an arterial issue however she does have rheumatoid arthritis, crest syndrome etc. I have been concerned to actually biopsy this however unless of course it worsens 11/11; small punched out area on the left anterior lateral foot. The cause of this is never really clear. She has venous disease previous venous stripping and was followed by Dr. Aleda Grana of [facility]. She saw Dr. Ozella Almond office in 2018 once. She does not want to go back there. She was not felt to have a DVT or evidence of reflux at that time. Although it was difficult to sort out I really do not believe this lady has had formal arterial studies. The area on the left lateral foot still has debris requiring debridement. This is disappointing she has been using Santyl. 11/18; small punched out area on the left lateral foot. Using Santyl and certainly the adherent debris at the base of this is a lot better. She is going for arterial studies later today at Dr. Gwenlyn Found and Dr. Cyndi Lennert office in [place]. If these show adequate blood flow I think we need to reach out to her rheumatologist about discontinuing the methotrexate. The cause of this wound has never really been clear to me. She does however have a complicated rheumatologic history [see above] 04/06/2019 on evaluation today patient appears to be doing well at this point with regard to  her wound. The size is really about the same as previously noted. With that being said this is the first time I seen her but it appears that she has a lot more slough buildup than she did last week when collagen was used. This is something that the patient's case manager nurse also stated to me as well. I really feel like the Santyl likely is going to do better for her. She does have an appointment with Dr. Naida Sleight office on December 1. LAGUITA, BROMBERG ([id number]) 12/2; changed back to sample last week. I looked at the picture and she had a large amount of necrotic debris in the wound surface and slough I certainly agree with this. The wound actually looks better today but still required debridement I took a call from Dr. Gwenlyn Found yesterday. He felt that the patient had a 80% left SFA stenosis although he was not at all sure that the stenosis was the cause of the patient's wound where that the nonhealing state was related to ischemia. We agreed that we would attempt ongoing wound care and that if the wound stalled deteriorated we would consider further interventions including angiography. I should note that the patient pointed out to me today that the only time this really bothers her is when she is lying in bed and the pain is relieved by putting her leg over the edge of the bed. Unfortunately if she did not seem to tell Dr. Gwenlyn Found this 12/9; punched out area on the left lateral foot. The surface of this looks better. We have been using Santyl she  has been changing the dressing. She complains of a lot of pain in the foot specifically the tips of her first and fourth toes. She shows me a small little splinter hemorrhages that look like telangiectasias on the tip of some of her fingers. No doubt this is related to her scleroderma. As I noted on my note from last week;. Dr. Gwenlyn Found was not convinced the SFA stenosis was related to the wound on her foot. He wanted wound care to see if this would heal on its  own without arterial intervention. 12/16; punched out area on the left lateral foot. Still requiring debridement. We have been using Santyl. She is increasing her complaints of pain in the left first and especially the left fourth toe. I also spoke to Dr. Jefm Bryant today. He was okay with holding the methotrexate although he made the point that methotrexate is rarely complicating wound healing at these doses. He also wondered about pyoderma and the possibility of microcalcifications from her crest syndrome. We did an x-ray of her foot there were not any comments about microcalcification. From my point of view this certainly does not look like pyoderma. The first and fourth toes look like ischemic injury. This may be microvascular but I am going to ask Dr. Gwenlyn Found to proceed with revascularization at this point I do not know that there is any other choice here. I will review the angiogram 12/23; after the patient's appointment last week I have been in contact with Dr. Gwenlyn Found. She is now booked for an angiogram on 1/4. She is having increase ischemic breakdown of the left fourth toe and a lot of pain. She has had tramadol ordered by her primary physician Dr. Nicki Reaper. No improvement in the area on her left dorsal foot which was the initial wound 05/18/2019; the patient was admitted to hospital from 1/4 through 1/5 by Dr. Gwenlyn Found. She had angiogram of the left leg. She underwent angioplasty of the mid left SFA lesion. Angiographically this appeared to be more a 60%. Dr. Gwenlyn Found commented to me that he was not certain this would help her lower foot. It was also found that she had no entrance to the AT or PT. Therefore access to the tibial vessels was not possible. At some point it was felt that if she does not improve she may need dorsalis pedis access with potential recanalization retrograde of her anterior tibial artery. She was just discharged from the hospital yesterday. She is still having a lot of pain in the  left fourth toe. She also quite clearly complains of rest claudication at night Electronic Signature(s) Signed: 05/18/2019 4:45:14 PM By: Linton Ham MD Entered By: Linton Ham on 05/18/2019 12:46:19 Nina Perez ([id number]) -------------------------------------------------------------------------------- Physical Exam Details Patient Name: Nina Perez Date of Service: 05/18/2019 10:45 AM Medical Record Number: [id number] Patient Account Number: [id number] Date of Birth/Sex: 03-18-1941 (78 y.o. F) Treating RN: Cornell Barman Primary Care Provider: Einar Pheasant Other Clinician: Referring Provider: Einar Pheasant Treating Provider/Extender: Tito Dine in Treatment: 11 Constitutional Patient is hypertensive.. Pulse regular and within target range for patient.Marland Kitchen Respirations regular, non-labored and within target range.. Temperature is normal and within the target range for the patient.Marland Kitchen appears in no distress. Cardiovascular Paradoxically I can actually feel the dorsalis pedis pulse on the left this is new and an improvement. I cannot feel the posterior tibial. 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 Paradoxically again the right first toe looks better. Notes Wound  exam; the original wound on the dorsal foot looks about the same. No debridement done. There is some improvement in the amount of adherent slough I will continue Santyl. oThe left fourth toe is a lot worse than 2 weeks ago. Now demarcating in terms of dry gangrene. The right first toe at the tip looks better Electronic Signature(s) Signed: 05/18/2019 4:45:14 PM By: Linton Ham MD Entered By: Linton Ham on 05/18/2019 12:49:37 Nina Perez ([id number]) -------------------------------------------------------------------------------- Physician Orders Details Patient Name: Nina Perez Date of Service: 05/18/2019 10:45 AM Medical Record Number:  [id number] Patient Account Number: [id number] Date of Birth/Sex: [birth year] (79 y.o. F) Treating RN: Cornell Barman Primary Care Provider: Einar Pheasant Other Clinician: Referring Provider: Einar Pheasant Treating Provider/Extender: Tito Dine in Treatment: 11 Verbal / Phone Orders: No Diagnosis Coding Wound Cleansing Wound #1 Left,Lateral Foot o Clean wound with Normal Saline. o May Shower, gently pat wound dry prior to applying new dressing. Wound #2 Left,Medial Toe Fourth o Clean wound with Normal Saline. o May Shower, gently pat wound dry prior to applying new dressing. Anesthetic (add to Medication List) Wound #1 Left,Lateral Foot o Topical Lidocaine 4% cream applied to wound bed prior to debridement (In Clinic Only). Wound #2 Left,Medial Toe Fourth o Topical Lidocaine 4% cream applied to wound bed prior to debridement (In Clinic Only). Primary Wound Dressing Wound #1 Left,Lateral Foot o Santyl Ointment Wound #2 Left,Medial Toe Fourth o Silver Alginate Secondary Dressing Wound #1 Left,Lateral Foot o ABD and Kerlix/Conform Wound #2 Left,Medial Toe Fourth o ABD and Kerlix/Conform Dressing Change Frequency Wound #1 Left,Lateral Foot o Change dressing every day. Wound #2 Left,Medial Toe Fourth o Change dressing every day. Follow-up Appointments Wound #1 Left,Lateral Foot o Return Appointment in 1 week. Wound #2 Left,Medial Toe Fourth o Return Appointment in 1 week. JAISE, BUCHAN TMarland Kitchen ([id number]) Edema Control Wound #1 Left,Lateral Foot o Patient to wear own compression stockings o Elevate legs to the level of the heart and pump ankles as often as possible Wound #2 Left,Medial Toe Fourth o Patient to wear own compression stockings o Elevate legs to the level of the heart and pump ankles as often as possible Off-Loading Wound #1 Left,Lateral Foot o Other: - keep pressure off of wound area Wound #2 Left,Medial Toe  Fourth o Other: - keep pressure off of wound area Additional Orders / Instructions Wound #1 Left,Lateral Foot o Activity as tolerated Wound #2 Left,Medial Toe Fourth o Activity as tolerated Medications-please add to medication list. Wound #1 Left,Lateral Foot o Santyl Enzymatic Ointment Notes Follow-up with Dr. Gwenlyn Found on 1/12/221. Electronic Signature(s) Signed: 05/18/2019 4:45:14 PM By: Linton Ham MD Entered By: Linton Ham on 05/18/2019 12:50:10 Nina Perez ([id number]) -------------------------------------------------------------------------------- Problem List Details Patient Name: Nina Perez Date of Service: 05/18/2019 10:45 AM Medical Record Number: [id number] Patient Account Number: [id number] Date of Birth/Sex: March 29, 1941 (78 y.o. F) Treating RN: Cornell Barman Primary Care Provider: Einar Pheasant Other Clinician: Referring Provider: Einar Pheasant Treating Provider/Extender: Tito Dine in Treatment: 11 Active Problems ICD-10 Evaluated Encounter Code Description Active Date Today Diagnosis E11.52 Type 2 diabetes mellitus with diabetic peripheral angiopathy 05/04/2019 No Yes with gangrene E11.621 Type 2 diabetes mellitus with foot ulcer 03/02/2019 No Yes I87.323 Chronic venous hypertension (idiopathic) with inflammation of 03/02/2019 No Yes bilateral lower extremity L97.528 Non-pressure chronic ulcer of other part of left foot with other 03/02/2019 No Yes specified severity M05.7A Rheumatoid arthritis with rheumatoid factor of other specified 03/02/2019 No Yes site without  organ or systems involvement Inactive Problems Resolved Problems Electronic Signature(s) Signed: 05/18/2019 4:45:14 PM By: Linton Ham MD Entered By: Linton Ham on 05/18/2019 12:42:04 Nina Perez ([id number]) -------------------------------------------------------------------------------- Progress Note Details Patient Name: Nina Perez Date of Service:  05/18/2019 10:45 AM Medical Record Number: [id number] Patient Account Number: [id number] Date of Birth/Sex: [birth year] (79 y.o. F) Treating RN: Cornell Barman Primary Care Provider: Einar Pheasant Other Clinician: Referring Provider: Einar Pheasant Treating Provider/Extender: Tito Dine in Treatment: 11 Subjective History of Present Illness (HPI) ADMISSION 03/02/2019 Mrs. Ruggiano is a 79 year old woman with a complicated past medical history who presents for review of wound on the left lateral foot for the last 6 weeks. She states she simply looked down and saw a black spot on the left lateral foot and thought this was dirt went to wipe it off and there was an open wound. This is not gotten any better and over the timeframe. She has been using topical triple antibiotic ointment. She has a history of chronic venous insufficiency and has had wounds on her lower legs related to this but never on her feet. She is known to Dr. Aleda Grana at [facility] in [place] and she went to see him about this. He did not order any specific tests and said that he was not exactly sure what it because this per the patient's description. She was put in an [employer] but it looked as though this was stopped on 10/8. She had a week's worth of doxycycline as well. Patient states the area is very painful rating it at a 10 out of 10. She does not really describe claudication element. 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 X-ray we ordered last week was negative for any bony abnormality The patient had venous studies on 06/16/2018 in the [facility] system. There was no thrombosis and they did not comment on reflux. She has had previous vein surgery ABIs in this clinic were 0.96 on the right and 0.88 on the left 10/28;  the patient arrives with a small punched out area on the left lateral foot. This appears somewhat better than last week but still with a nonviable surface. I did not feel when I admitted her last week that she had significant arterial disease. She definitely has chronic venous insufficiency and multiple other rheumatologic issues including rheumatoid arthritis, scleroderma crest syndrome but the exact etiology of this wound is not really clear in its location. We used Santyl 11/4; small punched out area on the left anterior lateral foot. The exact reason for this wound is not really clear. She has venous disease however I am not certain that this explains the presentation like this. She saw Dr. Aleda Grana of [facility] before she came into this clinic. She does have other possible alternatives for this wound. I do not believe she has an arterial issue however she does have rheumatoid arthritis, crest syndrome etc. I have been concerned to actually biopsy this however unless of course it worsens 11/11; small punched out area on the left anterior lateral foot. The cause of this is never really clear. She has venous disease previous venous stripping and was followed by Dr. Aleda Grana of [facility]. She saw Dr. Ozella Almond office in 2018 once. She does not want to go back there. She was not felt to have  a DVT or evidence of reflux at that time. Although it was difficult to sort out I really do not believe this lady has had formal arterial studies. The area on the left lateral foot still has debris requiring debridement. This is disappointing she has been using Santyl. 11/18; small punched out area on the left lateral foot. Using Santyl and certainly the adherent debris at the base of this is a lot better. She is going for arterial studies later today at Dr. Gwenlyn Found and Dr. Cyndi Lennert office in [place]. If these show adequate blood flow I think we need to reach out to her rheumatologist  about discontinuing the methotrexate. The cause of this wound has never really been clear to me. She does however have a complicated rheumatologic history [see above] 04/06/2019 on evaluation today patient appears to be doing well at this point with regard to her wound. The size is really about the same as previously noted. With that being said this is the first time I seen her but it appears that she has a lot more slough buildup than she did last week when collagen was used. This is something that the patient's case manager nurse also stated to me as well. I really feel like the Santyl likely is going to do better for her. She does have an appointment with Dr. Edison Pace, Nadara Mode ([id number]) [facility] office on December 1. 12/2; changed back to sample last week. I looked at the picture and she had a large amount of necrotic debris in the wound surface and slough I certainly agree with this. The wound actually looks better today but still required debridement I took a call from Dr. Gwenlyn Found yesterday. He felt that the patient had a 80% left SFA stenosis although he was not at all sure that the stenosis was the cause of the patient's wound where that the nonhealing state was related to ischemia. We agreed that we would attempt ongoing wound care and that if the wound stalled deteriorated we would consider further interventions including angiography. I should note that the patient pointed out to me today that the only time this really bothers her is when she is lying in bed and the pain is relieved by putting her leg over the edge of the bed. Unfortunately if she did not seem to tell Dr. Gwenlyn Found this 12/9; punched out area on the left lateral foot. The surface of this looks better. We have been using Santyl she has been changing the dressing. She complains of a lot of pain in the foot specifically the tips of her first and fourth toes. She shows me a small little splinter hemorrhages that look like  telangiectasias on the tip of some of her fingers. No doubt this is related to her scleroderma. As I noted on my note from last week;. Dr. Gwenlyn Found was not convinced the SFA stenosis was related to the wound on her foot. He wanted wound care to see if this would heal on its own without arterial intervention. 12/16; punched out area on the left lateral foot. Still requiring debridement. We have been using Santyl. She is increasing her complaints of pain in the left first and especially the left fourth toe. I also spoke to Dr. Jefm Bryant today. He was okay with holding the methotrexate although he made the point that methotrexate is rarely complicating wound healing at these doses. He also wondered about pyoderma and the possibility of microcalcifications from her crest syndrome. We did an x-ray of her foot  there were not any comments about microcalcification. From my point of view this certainly does not look like pyoderma. The first and fourth toes look like ischemic injury. This may be microvascular but I am going to ask Dr. Gwenlyn Found to proceed with revascularization at this point I do not know that there is any other choice here. I will review the angiogram 12/23; after the patient's appointment last week I have been in contact with Dr. Gwenlyn Found. She is now booked for an angiogram on 1/4. She is having increase ischemic breakdown of the left fourth toe and a lot of pain. She has had tramadol ordered by her primary physician Dr. Nicki Reaper. No improvement in the area on her left dorsal foot which was the initial wound 05/18/2019; the patient was admitted to hospital from 1/4 through 1/5 by Dr. Gwenlyn Found. She had angiogram of the left leg. She underwent angioplasty of the mid left SFA lesion. Angiographically this appeared to be more a 60%. Dr. Gwenlyn Found commented to me that he was not certain this would help her lower foot. It was also found that she had no entrance to the AT or PT. Therefore access to the tibial vessels  was not possible. At some point it was felt that if she does not improve she may need dorsalis pedis access with potential recanalization retrograde of her anterior tibial artery. She was just discharged from the hospital yesterday. She is still having a lot of pain in the left fourth toe. She also quite clearly complains of rest claudication at night Objective Constitutional Patient is hypertensive.. Pulse regular and within target range for patient.Marland Kitchen Respirations regular, non-labored and within target range.. Temperature is normal and within the target range for the patient.Marland Kitchen appears in no distress. Vitals Time Taken: 10:34 AM, Height: 66 in, Weight: 230 lbs, BMI: 37.1, Temperature: 98.0 F, Pulse: 71 bpm, Respiratory Rate: 18 breaths/min, Blood Pressure: 149/83 mmHg. Cardiovascular Paradoxically I can actually feel the dorsalis pedis pulse on the left this is new and an improvement. I cannot feel the posterior tibial. SANIAA, HOSCHAR T. ([id number]) General Notes: Wound exam; the original wound on the dorsal foot looks about the same. No debridement done. There is some improvement in the amount of adherent slough I will continue Santyl. The left fourth toe is a lot worse than 2 weeks ago. Now demarcating in terms of dry gangrene. 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 Paradoxically again the right first toe looks better. Wound #1 status is Open. Original cause of wound was Gradually Appeared. The wound is located on the Left,Lateral Foot. The wound measures 1.5cm length x 1cm width x 0.2cm depth; 1.178cm^2 area and 0.236cm^3 volume. There is Fat Layer (Subcutaneous Tissue) Exposed exposed. There is no tunneling noted. There is a medium amount of serosanguineous drainage noted. The wound margin is flat and intact. There is small (1-33%) red granulation within the wound bed. There is a large  (67-100%) amount of necrotic tissue within the wound bed including Eschar and Adherent Slough. Wound #2 status is Open. Original cause of wound was Gradually Appeared. The wound is located on the Left,Medial Toe Fourth. The wound measures 1.5cm length x 6cm width x 0.1cm depth; 7.069cm^2 area and 0.707cm^3 volume. There is Fat Layer (Subcutaneous Tissue) Exposed exposed. There is no tunneling or undermining noted. There is a medium amount of serosanguineous drainage noted. The wound margin is flat and intact. There  is no granulation within the wound bed. There is a large (67-100%) amount of necrotic tissue within the wound bed including Eschar and Adherent Slough. Assessment Active Problems ICD-10 Type 2 diabetes mellitus with diabetic peripheral angiopathy with gangrene Type 2 diabetes mellitus with foot ulcer Chronic venous hypertension (idiopathic) with inflammation of bilateral lower extremity Non-pressure chronic ulcer of other part of left foot with other specified severity Rheumatoid arthritis with rheumatoid factor of other specified site without organ or systems involvement Procedures Wound #1 Pre-procedure diagnosis of Wound #1 is a Diabetic Wound/Ulcer of the Lower Extremity located on the Left,Lateral Foot .Severity of Tissue Pre Debridement is: Fat layer exposed. There was a Chemical/Enzymatic/Mechanical debridement performed by Ricard Dillon, MD. With the following instrument(s): tongue blade after achieving pain control using Lidocaine. Agent used was [employer]. A time out was conducted at 11:06, prior to the start of the procedure. There was no bleeding. The procedure was tolerated well. Post Debridement Measurements: 1.5cm length x 1cm width x 0.2cm depth; 0.236cm^3 volume. Character of Wound/Ulcer Post Debridement is stable. Severity of Tissue Post Debridement is: Fat layer exposed. Post procedure Diagnosis Wound #1: Same as Pre-Procedure Plan LUCIL, KNAPE.  ([id number]) Wound Cleansing: Wound #1 Left,Lateral Foot: Clean wound with Normal Saline. May Shower, gently pat wound dry prior to applying new dressing. Wound #2 Left,Medial Toe Fourth: Clean wound with Normal Saline. May Shower, gently pat wound dry prior to applying new dressing. Anesthetic (add to Medication List): Wound #1 Left,Lateral Foot: Topical Lidocaine 4% cream applied to wound bed prior to debridement (In Clinic Only). Wound #2 Left,Medial Toe Fourth: Topical Lidocaine 4% cream applied to wound bed prior to debridement (In Clinic Only). Primary Wound Dressing: Wound #1 Left,Lateral Foot: Santyl Ointment Wound #2 Left,Medial Toe Fourth: Silver Alginate Secondary Dressing: Wound #1 Left,Lateral Foot: ABD and Kerlix/Conform Wound #2 Left,Medial Toe Fourth: ABD and Kerlix/Conform Dressing Change Frequency: Wound #1 Left,Lateral Foot: Change dressing every day. Wound #2 Left,Medial Toe Fourth: Change dressing every day. Follow-up Appointments: Wound #1 Left,Lateral Foot: Return Appointment in 1 week. Wound #2 Left,Medial Toe Fourth: Return Appointment in 1 week. Edema Control: Wound #1 Left,Lateral Foot: Patient to wear own compression stockings Elevate legs to the level of the heart and pump ankles as often as possible Wound #2 Left,Medial Toe Fourth: Patient to wear own compression stockings Elevate legs to the level of the heart and pump ankles as often as possible Off-Loading: Wound #1 Left,Lateral Foot: Other: - keep pressure off of wound area Wound #2 Left,Medial Toe Fourth: Other: - keep pressure off of wound area Additional Orders / Instructions: Wound #1 Left,Lateral Foot: Activity as tolerated Wound #2 Left,Medial Toe Fourth: Activity as tolerated Medications-please add to medication list.: Wound #1 Left,Lateral Foot: Santyl Enzymatic Ointment General Notes: Follow-up with Dr. Gwenlyn Found on 1/12/221. Reeltown, Lisbeth T. ([id number]) 1. Status post left  SFA angioplasty. 2. 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 3. She has a palpable dorsalis pedis pulse now which is a big improvement versus preprocedure unfortunately she still has dry gangrene of the left fourth toe and what sounds like more generalized claudication pain in the foot at night. 4. I will contact Dr. Gwenlyn Found to see what else he proposes. If there is a retrograde approach to the anterior tibial artery he will need to do that soon. She may require amputation of the left fourth toe however I would like to be certain we do everything from a revascularization point  of view before that is attempted lest we develop a nonhealing surgical wound 5. No evidence of infection 6. The original wound on the left lateral dorsal foot actually looks some better in terms of surface. No improvement in terms of surface area. I am continuing with Santyl until we finish with revascularization. At that point might consider an advanced treatment option Electronic Signature(s) Signed: 05/18/2019 4:45:14 PM By: Linton Ham MD Entered By: Linton Ham on 05/18/2019 12:54:40 Nina Perez ([id number]) -------------------------------------------------------------------------------- SuperBill Details Patient Name: Nina Perez Date of Service: 05/18/2019 Medical Record Number: [id number] Patient Account Number: [id number] Date of Birth/Sex: 12-14-1940 (79 y.o. F) Treating RN: Cornell Barman Primary Care Provider: Einar Pheasant Other Clinician: Referring Provider: Einar Pheasant Treating Provider/Extender: Tito Dine in Treatment: 11 Diagnosis Coding ICD-10 Codes Code Description E11.52 Type 2 diabetes mellitus with diabetic peripheral angiopathy with gangrene E11.621 Type 2 diabetes mellitus with foot ulcer I87.323 Chronic venous hypertension (idiopathic) with inflammation of bilateral lower extremity L97.528 Non-pressure chronic ulcer of other part of  left foot with other specified severity M05.7A Rheumatoid arthritis with rheumatoid factor of other specified site without organ or systems involvement Facility Procedures CPT4 Code: [id number] Description: [phone] - DEBRIDE W/O ANES NON SELECT Modifier: Quantity: 1 Physician Procedures CPT4 Code Description: [id number] - WC PHYS LEVEL 4 - EST PT ICD-10 Diagnosis Description E11.52 Type 2 diabetes mellitus with diabetic peripheral angiopathy wit L97.528 Non-pressure chronic ulcer of other part of left foot with other E11.621 Type 2  diabetes mellitus with foot ulcer Modifier: h gangrene specified severi Quantity: 1 ty Electronic Signature(s) Signed: 05/18/2019 4:45:14 PM By: Linton Ham MD Entered By: Linton Ham on 05/18/2019 12:53:40

## 2019-05-18 NOTE — Progress Notes (Signed)
LEANE, SOLACHE ([id number]) Visit Report for 05/04/2019 Arrival Information Details Patient Name: Nina Perez Date of Service: 05/04/2019 10:15 AM Medical Record Number: [id number] Patient Account Number: [id number] Date of Birth/Sex: June 20, 1940 (79 y.o. F) Treating RN: Army Melia Primary Care Cordelia Bessinger: Einar Pheasant Other Clinician: Referring Lateka Rady: Einar Pheasant Treating Eniola Cerullo/Extender: Tito Dine in Treatment: 9 Visit Information History Since Last Visit Added or deleted any medications: No Patient Arrived: Wheel Chair Any new allergies or adverse reactions: No Arrival Time: 10:11 Had a fall or experienced change in No Accompanied By: husband activities of daily living that may affect Transfer Assistance: None risk of falls: Patient Identification Verified: Yes Signs or symptoms of abuse/neglect since last visito No Hospitalized since last visit: No Has Dressing in Place as Prescribed: Yes Pain Present Now: No Electronic Signature(s) Signed: 05/04/2019 11:52:26 AM By: Army Melia Entered By: Army Melia on 05/04/2019 10:12:56 Nina Perez ([id number]) -------------------------------------------------------------------------------- Encounter Discharge Information Details Patient Name: Nina Perez Date of Service: 05/04/2019 10:15 AM Medical Record Number: [id number] Patient Account Number: [id number] Date of Birth/Sex: 12-06-1940 (78 y.o. F) Treating RN: Cornell Barman Primary Care Danyah Guastella: Einar Pheasant Other Clinician: Referring Emely Fahy: Einar Pheasant Treating Madicyn Mesina/Extender: Tito Dine in Treatment: 9 Encounter Discharge Information Items Post Procedure Vitals Discharge Condition: Stable Temperature (F): 98.3 Ambulatory Status: Ambulatory Pulse (bpm): 76 Discharge Destination: Home Respiratory Rate (breaths/min): 16 Transportation: Private Auto Blood Pressure (mmHg): 186/78 Accompanied By: self Schedule Follow-up  Appointment: No Clinical Summary of Care: Electronic Signature(s) Signed: 05/18/2019 4:51:46 PM By: Gretta Cool, BSN, RN, CWS, Kim RN, BSN Entered By: Gretta Cool, BSN, RN, CWS, Kim on 05/04/2019 10:51:10 Nina Perez ([id number]) -------------------------------------------------------------------------------- Lower Extremity Assessment Details Patient Name: Nina Perez Date of Service: 05/04/2019 10:15 AM Medical Record Number: [id number] Patient Account Number: [id number] Date of Birth/Sex: March 23, 1941 (78 y.o. F) Treating RN: Army Melia Primary Care Juniper Cobey: Einar Pheasant Other Clinician: Referring Ademide Schaberg: Einar Pheasant Treating Angelea Penny/Extender: Ricard Dillon Weeks in Treatment: 9 Edema Assessment Assessed: [Left: No] [Right: No] Edema: [Left: N] [Right: o] Vascular Assessment Pulses: Dorsalis Pedis Palpable: [Left:Yes] Electronic Signature(s) Signed: 05/04/2019 11:52:26 AM By: Army Melia Entered By: Army Melia on 05/04/2019 10:20:08 Nina Perez ([id number]) -------------------------------------------------------------------------------- Multi Wound Chart Details Patient Name: Nina Perez Date of Service: 05/04/2019 10:15 AM Medical Record Number: [id number] Patient Account Number: [id number] Date of Birth/Sex: January 23, 1941 (78 y.o. F) Treating RN: Cornell Barman Primary Care Karsyn Jamie: Einar Pheasant Other Clinician: Referring Roswell Ndiaye: Einar Pheasant Treating Kalijah Zeiss/Extender: Tito Dine in Treatment: 9 Vital Signs Height(in): 66 Pulse(bpm): 56 Weight(lbs): 230 Blood Pressure(mmHg): 176/86 Body Mass Index(BMI): 37 Temperature(F): 98.3 Respiratory Rate 16 (breaths/min): Photos: [N/A:N/A] Wound Location: Left Foot - Lateral Left Toe Fourth - Medial N/A Wounding Event: Gradually Appeared Gradually Appeared N/A Primary Etiology: Diabetic Wound/Ulcer of the Diabetic Wound/Ulcer of the N/A Lower Extremity Lower Extremity Comorbid History:  Hypertension, Type II Hypertension, Type II N/A Diabetes, Scleroderma, Gout, Diabetes, Scleroderma, Gout, Rheumatoid Arthritis Rheumatoid Arthritis Date Acquired: 01/17/2019 05/02/2019 N/A Weeks of Treatment: 9 0 N/A Wound Status: Open Open N/A Measurements L x W x D 1.5x1x0.3 1x0.8x0.1 N/A (cm) Area (cm) : 1.178 0.628 N/A Volume (cm) : 0.353 0.063 N/A % Reduction in Area: -19.00% N/A N/A % Reduction in Volume: -78.30% N/A N/A Classification: Grade 1 Grade 2 N/A Exudate Amount: Medium Medium N/A Exudate Type: Serosanguineous Serosanguineous N/A Exudate Color: red, brown red, brown N/A Wound Margin: Flat and Intact Flat and Intact N/A Granulation Amount:  No Bone Exposed: No Electronic Signature(s) EMMALENA, FETTERMAN ([id number]) Signed: 05/04/2019 11:52:26 AM By: Army Melia Entered By: Army Melia on 05/04/2019 10:19:53 Nina Perez ([id number]) -------------------------------------------------------------------------------- Vitals Details Patient Name: Nina Perez Date of Service: 05/04/2019 10:15 AM Medical Record Number: [id number] Patient Account Number: [id number] Date of Birth/Sex: [birth year] (79 y.o. F) Treating RN: Army Melia Primary Care Avis Tirone: Einar Pheasant Other Clinician: Referring Arrabella Westerman: Einar Pheasant Treating Myosha Cuadras/Extender: Tito Dine in Treatment: 9 Vital Signs Time Taken: 10:13 Temperature (F): 98.3 Height (in): 66 Pulse (bpm): 76 Weight (lbs): 230 Respiratory Rate (breaths/min): 16 Body Mass Index (BMI): 37.1 Blood Pressure (mmHg): 176/86 Reference Range: 80 - 120 mg / dl Electronic Signature(s) Signed: 05/04/2019 11:52:26 AM By: Army Melia Entered By: Army Melia on 05/04/2019 10:14:20  F) Treating RN: Army Melia Primary Care Izela Altier: Einar Pheasant Other Clinician: Referring Kaydence Menard: Einar Pheasant Treating Teandra Harlan/Extender: Tito Dine in Treatment: 9 Active Problems Location of Pain Severity and Description of Pain Patient Has Paino No Site Locations Pain Management and Medication Current Pain Management: Electronic Signature(s) Signed: 05/04/2019 11:52:26 AM  By: Army Melia Entered By: Army Melia on 05/04/2019 10:13:07 Nina Perez ([id number]) -------------------------------------------------------------------------------- Patient/Caregiver Education Details Patient Name: Nina Perez Date of Service: 05/04/2019 10:15 AM Medical Record Number: [id number] Patient Account Number: [id number] Date of Birth/Gender: [birth year] (79 y.o. F) Treating RN: Cornell Barman Primary Care Physician: Einar Pheasant Other Clinician: Referring Physician: Einar Pheasant Treating Physician/Extender: Tito Dine in Treatment: 9 Education Assessment Education Provided To: Patient Education Topics Provided Wound/Skin Impairment: Handouts: Caring for Your Ulcer Methods: Demonstration, Explain/Verbal Responses: State content correctly Electronic Signature(s) Signed: 05/18/2019 4:51:46 PM By: Gretta Cool, BSN, RN, CWS, Kim RN, BSN Entered By: Gretta Cool, BSN, RN, CWS, Kim on 05/04/2019 10:49:42 Nina Perez ([id number]) -------------------------------------------------------------------------------- Wound Assessment Details Patient Name: Nina Perez Date of Service: 05/04/2019 10:15 AM Medical Record Number: [id number] Patient Account Number: [id number] Date of Birth/Sex: 03-08-1941 (78 y.o. F) Treating RN: Army Melia Primary Care Aedyn Mckeon: Einar Pheasant Other Clinician: Referring Raquan Iannone: Einar Pheasant Treating Revecca Nachtigal/Extender: Tito Dine in Treatment: 9 Wound Status Wound Number: 1 Primary Diabetic Wound/Ulcer of the Lower Extremity Etiology: Wound Location: Left Foot - Lateral Wound Open Wounding Event: Gradually Appeared Status: Date Acquired: 01/17/2019 Comorbid Hypertension, Type II Diabetes, Scleroderma, Weeks Of Treatment: 9 History: Gout, Rheumatoid Arthritis Clustered Wound: No Photos Wound Measurements Length: (cm) 1.5 Width: (cm) 1 Depth: (cm) 0.3 Area: (cm) 1.178 Volume: (cm) 0.353 % Reduction in Area:  -19% % Reduction in Volume: -78.3% Epithelialization: None Tunneling: No Undermining: No Wound Description Classification: Grade 1 Foul Odor Wound Margin: Flat and Intact Slough/Fi Exudate Amount: Medium Exudate Type: Serosanguineous Exudate Color: red, brown After Cleansing: No brino Yes Wound Bed Granulation Amount: Small (1-33%) Exposed Structure Granulation Quality: Red Fascia Exposed: No Necrotic Amount: Large (67-100%) Fat Layer (Subcutaneous Tissue) Exposed: Yes Necrotic Quality: Eschar, Adherent Slough Tendon Exposed: No Muscle Exposed: No Joint Exposed: No Bone Exposed: No Electronic Signature(s) BREALYN, FOERST ([id number]) Signed: 05/04/2019 11:52:26 AM By: Army Melia Entered By: Army Melia on 05/04/2019 10:16:42 Nina Perez ([id number]) -------------------------------------------------------------------------------- Wound Assessment Details Patient Name: Nina Perez Date of Service: 05/04/2019 10:15 AM Medical Record Number: [id number] Patient Account Number: [id number] Date of Birth/Sex: 1941/02/21 (79 y.o. F) Treating RN: Army Melia Primary Care Toddrick Sanna: Einar Pheasant Other Clinician: Referring Tighe Gitto: Einar Pheasant Treating Oree Mirelez/Extender: Tito Dine in Treatment: 9 Wound Status Wound Number: 2 Primary Diabetic Wound/Ulcer of the Lower Extremity Etiology: Wound Location: Left Toe Fourth - Medial Wound Open Wounding Event: Gradually Appeared Status: Date Acquired: 05/02/2019 Comorbid Hypertension, Type II Diabetes, Scleroderma, Weeks Of Treatment: 0 History: Gout, Rheumatoid Arthritis Clustered Wound: No Photos Wound Measurements Length: (cm) 1 Width: (cm) 0.8 Depth: (cm) 0.1 Area: (cm) 0.628 Volume: (cm) 0.063 % Reduction in Area: % Reduction in Volume: Epithelialization: None Tunneling: No Undermining: No Wound Description Classification: Grade 2 Wound Margin: Flat and Intact Exudate Amount:  Medium Exudate Type: Serosanguineous Exudate Color: red, brown Foul Odor After Cleansing: No Slough/Fibrino Yes Wound Bed Granulation Amount: Small (1-33%) Exposed Structure Granulation Quality: Red Fascia Exposed: No Necrotic Amount: Large (67-100%) Fat Layer (Subcutaneous Tissue) Exposed: Yes Necrotic Quality: Eschar, Adherent Slough Tendon Exposed: No Muscle Exposed: No Joint Exposed:  F) Treating RN: Army Melia Primary Care Izela Altier: Einar Pheasant Other Clinician: Referring Kaydence Menard: Einar Pheasant Treating Teandra Harlan/Extender: Tito Dine in Treatment: 9 Active Problems Location of Pain Severity and Description of Pain Patient Has Paino No Site Locations Pain Management and Medication Current Pain Management: Electronic Signature(s) Signed: 05/04/2019 11:52:26 AM  By: Army Melia Entered By: Army Melia on 05/04/2019 10:13:07 Nina Perez ([id number]) -------------------------------------------------------------------------------- Patient/Caregiver Education Details Patient Name: Nina Perez Date of Service: 05/04/2019 10:15 AM Medical Record Number: [id number] Patient Account Number: [id number] Date of Birth/Gender: [birth year] (79 y.o. F) Treating RN: Cornell Barman Primary Care Physician: Einar Pheasant Other Clinician: Referring Physician: Einar Pheasant Treating Physician/Extender: Tito Dine in Treatment: 9 Education Assessment Education Provided To: Patient Education Topics Provided Wound/Skin Impairment: Handouts: Caring for Your Ulcer Methods: Demonstration, Explain/Verbal Responses: State content correctly Electronic Signature(s) Signed: 05/18/2019 4:51:46 PM By: Gretta Cool, BSN, RN, CWS, Kim RN, BSN Entered By: Gretta Cool, BSN, RN, CWS, Kim on 05/04/2019 10:49:42 Nina Perez ([id number]) -------------------------------------------------------------------------------- Wound Assessment Details Patient Name: Nina Perez Date of Service: 05/04/2019 10:15 AM Medical Record Number: [id number] Patient Account Number: [id number] Date of Birth/Sex: 03-08-1941 (78 y.o. F) Treating RN: Army Melia Primary Care Aedyn Mckeon: Einar Pheasant Other Clinician: Referring Raquan Iannone: Einar Pheasant Treating Revecca Nachtigal/Extender: Tito Dine in Treatment: 9 Wound Status Wound Number: 1 Primary Diabetic Wound/Ulcer of the Lower Extremity Etiology: Wound Location: Left Foot - Lateral Wound Open Wounding Event: Gradually Appeared Status: Date Acquired: 01/17/2019 Comorbid Hypertension, Type II Diabetes, Scleroderma, Weeks Of Treatment: 9 History: Gout, Rheumatoid Arthritis Clustered Wound: No Photos Wound Measurements Length: (cm) 1.5 Width: (cm) 1 Depth: (cm) 0.3 Area: (cm) 1.178 Volume: (cm) 0.353 % Reduction in Area:  -19% % Reduction in Volume: -78.3% Epithelialization: None Tunneling: No Undermining: No Wound Description Classification: Grade 1 Foul Odor Wound Margin: Flat and Intact Slough/Fi Exudate Amount: Medium Exudate Type: Serosanguineous Exudate Color: red, brown After Cleansing: No brino Yes Wound Bed Granulation Amount: Small (1-33%) Exposed Structure Granulation Quality: Red Fascia Exposed: No Necrotic Amount: Large (67-100%) Fat Layer (Subcutaneous Tissue) Exposed: Yes Necrotic Quality: Eschar, Adherent Slough Tendon Exposed: No Muscle Exposed: No Joint Exposed: No Bone Exposed: No Electronic Signature(s) BREALYN, FOERST ([id number]) Signed: 05/04/2019 11:52:26 AM By: Army Melia Entered By: Army Melia on 05/04/2019 10:16:42 Nina Perez ([id number]) -------------------------------------------------------------------------------- Wound Assessment Details Patient Name: Nina Perez Date of Service: 05/04/2019 10:15 AM Medical Record Number: [id number] Patient Account Number: [id number] Date of Birth/Sex: 1941/02/21 (79 y.o. F) Treating RN: Army Melia Primary Care Toddrick Sanna: Einar Pheasant Other Clinician: Referring Tighe Gitto: Einar Pheasant Treating Oree Mirelez/Extender: Tito Dine in Treatment: 9 Wound Status Wound Number: 2 Primary Diabetic Wound/Ulcer of the Lower Extremity Etiology: Wound Location: Left Toe Fourth - Medial Wound Open Wounding Event: Gradually Appeared Status: Date Acquired: 05/02/2019 Comorbid Hypertension, Type II Diabetes, Scleroderma, Weeks Of Treatment: 0 History: Gout, Rheumatoid Arthritis Clustered Wound: No Photos Wound Measurements Length: (cm) 1 Width: (cm) 0.8 Depth: (cm) 0.1 Area: (cm) 0.628 Volume: (cm) 0.063 % Reduction in Area: % Reduction in Volume: Epithelialization: None Tunneling: No Undermining: No Wound Description Classification: Grade 2 Wound Margin: Flat and Intact Exudate Amount:  Medium Exudate Type: Serosanguineous Exudate Color: red, brown Foul Odor After Cleansing: No Slough/Fibrino Yes Wound Bed Granulation Amount: Small (1-33%) Exposed Structure Granulation Quality: Red Fascia Exposed: No Necrotic Amount: Large (67-100%) Fat Layer (Subcutaneous Tissue) Exposed: Yes Necrotic Quality: Eschar, Adherent Slough Tendon Exposed: No Muscle Exposed: No Joint Exposed:

## 2019-05-18 NOTE — Progress Notes (Signed)
Nina Perez ([id number]) Visit Report for 05/18/2019 Arrival Information Details Patient Name: Nina Perez Date of Service: 05/18/2019 10:45 AM Medical Record Number: [id number] Patient Account Number: [id number] Date of Birth/Sex: 28-Aug-1940 (79 y.o. F) Treating RN: Cornell Barman Primary Care Lovett Coffin: Einar Pheasant Other Clinician: Referring Bailyn Spackman: Einar Pheasant Treating Shaquera Ansley/Extender: Tito Dine in Treatment: 11 Visit Information History Since Last Visit Added or deleted any medications: No Patient Arrived: Ambulatory Any new allergies or adverse reactions: No Arrival Time: 10:35 Had a fall or experienced change in No Accompanied By: husband activities of daily living that may affect Transfer Assistance: Manual risk of falls: Patient Identification Verified: Yes Signs or symptoms of abuse/neglect since last visito No Secondary Verification Process Completed: Yes Hospitalized since last visit: No Implantable device outside of the clinic excluding No cellular tissue based products placed in the center since last visit: Has Dressing in Place as Prescribed: Yes Pain Present Now: Yes Electronic Signature(s) Signed: 05/18/2019 3:30:55 PM By: Lorine Bears RCP, RRT, CHT Entered By: Lorine Bears on 05/18/2019 10:36:23 Nina Perez ([id number]) -------------------------------------------------------------------------------- Encounter Discharge Information Details Patient Name: Nina Perez Date of Service: 05/18/2019 10:45 AM Medical Record Number: [id number] Patient Account Number: [id number] Date of Birth/Sex: Jun 12, 1940 (78 y.o. F) Treating RN: Cornell Barman Primary Care Jaxx Huish: Einar Pheasant Other Clinician: Referring Eythan Jayne: Einar Pheasant Treating Sebastin Perlmutter/Extender: Tito Dine in Treatment: 11 Encounter Discharge Information Items Post Procedure Vitals Discharge Condition: Stable Temperature (F):  98 Ambulatory Status: Wheelchair Pulse (bpm): 71 Discharge Destination: Home Respiratory Rate (breaths/min): 18 Transportation: Private Auto Blood Pressure (mmHg): 149/83 Accompanied By: husband Schedule Follow-up Appointment: Yes Clinical Summary of Care: Electronic Signature(s) Signed: 05/18/2019 4:51:02 PM By: Gretta Cool, BSN, RN, CWS, Kim RN, BSN Entered By: Gretta Cool, BSN, RN, CWS, Kim on 05/18/2019 11:09:47 Nina Perez ([id number]) -------------------------------------------------------------------------------- Lower Extremity Assessment Details Patient Name: Nina Perez Date of Service: 05/18/2019 10:45 AM Medical Record Number: [id number] Patient Account Number: [id number] Date of Birth/Sex: [birth year] (78 y.o. F) Treating RN: Harold Barban Primary Care Shukri Nistler: Einar Pheasant Other Clinician: Referring Tyleigh Mahn: Einar Pheasant Treating Jimmi Sidener/Extender: Ricard Dillon Weeks in Treatment: 11 Vascular Assessment Pulses: Dorsalis Pedis Palpable: [Right:No] Posterior Tibial Palpable: [Right:No] Electronic Signature(s) Signed: 05/18/2019 10:54:14 AM By: Harold Barban Entered By: Harold Barban on 05/18/2019 10:54:14 Nina Perez ([id number]) -------------------------------------------------------------------------------- Multi Wound Chart Details Patient Name: Nina Perez Date of Service: 05/18/2019 10:45 AM Medical Record Number: [id number] Patient Account Number: [id number] Date of Birth/Sex: Sep 07, 1940 (78 y.o. F) Treating RN: Cornell Barman Primary Care Monike Bragdon: Einar Pheasant Other Clinician: Referring Narely Nobles: Einar Pheasant Treating Candance Bohlman/Extender: Tito Dine in Treatment: 11 Vital Signs Height(in): 66 Pulse(bpm): 7 Weight(lbs): 230 Blood Pressure(mmHg): 149/83 Body Mass Index(BMI): 37 Temperature(F): 98.0 Respiratory Rate 18 (breaths/min): Photos: [N/A:N/A] Wound Location: Left Foot - Lateral Left Toe Fourth - Medial  N/A Wounding Event: Gradually Appeared Gradually Appeared N/A Primary Etiology: Diabetic Wound/Ulcer of the Diabetic Wound/Ulcer of the N/A Lower Extremity Lower Extremity Comorbid History: Hypertension, Type II Hypertension, Type II N/A Diabetes, Scleroderma, Gout, Diabetes, Scleroderma, Gout, Rheumatoid Arthritis Rheumatoid Arthritis Date Acquired: 01/17/2019 05/02/2019 N/A Weeks of Treatment: 11 2 N/A Wound Status: Open Open N/A Measurements L x W x D 1.5x1x0.2 1.5x6x0.1 N/A (cm) Area (cm) : 1.178 7.069 N/A Volume (cm) : 0.236 0.707 N/A % Reduction in Area: -19.00% -1025.60% N/A % Reduction in Volume: -19.20% -1022.20% N/A Classification: Grade 1 Grade 2 N/A Exudate Amount: Medium Medium N/A Exudate Type: Serosanguineous  Nina Perez ([id number]) Visit Report for 05/18/2019 Arrival Information Details Patient Name: Nina Perez Date of Service: 05/18/2019 10:45 AM Medical Record Number: [id number] Patient Account Number: [id number] Date of Birth/Sex: 28-Aug-1940 (79 y.o. F) Treating RN: Cornell Barman Primary Care Lovett Coffin: Einar Pheasant Other Clinician: Referring Bailyn Spackman: Einar Pheasant Treating Shaquera Ansley/Extender: Tito Dine in Treatment: 11 Visit Information History Since Last Visit Added or deleted any medications: No Patient Arrived: Ambulatory Any new allergies or adverse reactions: No Arrival Time: 10:35 Had a fall or experienced change in No Accompanied By: husband activities of daily living that may affect Transfer Assistance: Manual risk of falls: Patient Identification Verified: Yes Signs or symptoms of abuse/neglect since last visito No Secondary Verification Process Completed: Yes Hospitalized since last visit: No Implantable device outside of the clinic excluding No cellular tissue based products placed in the center since last visit: Has Dressing in Place as Prescribed: Yes Pain Present Now: Yes Electronic Signature(s) Signed: 05/18/2019 3:30:55 PM By: Lorine Bears RCP, RRT, CHT Entered By: Lorine Bears on 05/18/2019 10:36:23 Nina Perez ([id number]) -------------------------------------------------------------------------------- Encounter Discharge Information Details Patient Name: Nina Perez Date of Service: 05/18/2019 10:45 AM Medical Record Number: [id number] Patient Account Number: [id number] Date of Birth/Sex: Jun 12, 1940 (78 y.o. F) Treating RN: Cornell Barman Primary Care Jaxx Huish: Einar Pheasant Other Clinician: Referring Eythan Jayne: Einar Pheasant Treating Sebastin Perlmutter/Extender: Tito Dine in Treatment: 11 Encounter Discharge Information Items Post Procedure Vitals Discharge Condition: Stable Temperature (F):  98 Ambulatory Status: Wheelchair Pulse (bpm): 71 Discharge Destination: Home Respiratory Rate (breaths/min): 18 Transportation: Private Auto Blood Pressure (mmHg): 149/83 Accompanied By: husband Schedule Follow-up Appointment: Yes Clinical Summary of Care: Electronic Signature(s) Signed: 05/18/2019 4:51:02 PM By: Gretta Cool, BSN, RN, CWS, Kim RN, BSN Entered By: Gretta Cool, BSN, RN, CWS, Kim on 05/18/2019 11:09:47 Nina Perez ([id number]) -------------------------------------------------------------------------------- Lower Extremity Assessment Details Patient Name: Nina Perez Date of Service: 05/18/2019 10:45 AM Medical Record Number: [id number] Patient Account Number: [id number] Date of Birth/Sex: [birth year] (78 y.o. F) Treating RN: Harold Barban Primary Care Shukri Nistler: Einar Pheasant Other Clinician: Referring Tyleigh Mahn: Einar Pheasant Treating Jimmi Sidener/Extender: Ricard Dillon Weeks in Treatment: 11 Vascular Assessment Pulses: Dorsalis Pedis Palpable: [Right:No] Posterior Tibial Palpable: [Right:No] Electronic Signature(s) Signed: 05/18/2019 10:54:14 AM By: Harold Barban Entered By: Harold Barban on 05/18/2019 10:54:14 Nina Perez ([id number]) -------------------------------------------------------------------------------- Multi Wound Chart Details Patient Name: Nina Perez Date of Service: 05/18/2019 10:45 AM Medical Record Number: [id number] Patient Account Number: [id number] Date of Birth/Sex: Sep 07, 1940 (78 y.o. F) Treating RN: Cornell Barman Primary Care Monike Bragdon: Einar Pheasant Other Clinician: Referring Narely Nobles: Einar Pheasant Treating Candance Bohlman/Extender: Tito Dine in Treatment: 11 Vital Signs Height(in): 66 Pulse(bpm): 7 Weight(lbs): 230 Blood Pressure(mmHg): 149/83 Body Mass Index(BMI): 37 Temperature(F): 98.0 Respiratory Rate 18 (breaths/min): Photos: [N/A:N/A] Wound Location: Left Foot - Lateral Left Toe Fourth - Medial  N/A Wounding Event: Gradually Appeared Gradually Appeared N/A Primary Etiology: Diabetic Wound/Ulcer of the Diabetic Wound/Ulcer of the N/A Lower Extremity Lower Extremity Comorbid History: Hypertension, Type II Hypertension, Type II N/A Diabetes, Scleroderma, Gout, Diabetes, Scleroderma, Gout, Rheumatoid Arthritis Rheumatoid Arthritis Date Acquired: 01/17/2019 05/02/2019 N/A Weeks of Treatment: 11 2 N/A Wound Status: Open Open N/A Measurements L x W x D 1.5x1x0.2 1.5x6x0.1 N/A (cm) Area (cm) : 1.178 7.069 N/A Volume (cm) : 0.236 0.707 N/A % Reduction in Area: -19.00% -1025.60% N/A % Reduction in Volume: -19.20% -1022.20% N/A Classification: Grade 1 Grade 2 N/A Exudate Amount: Medium Medium N/A Exudate Type: Serosanguineous  in Volume: -1022.2% Epithelialization: None Tunneling: No Undermining: No Wound Description Classification: Grade 2 Foul Odo Wound Margin: Flat and Intact Slough/F Exudate Amount: Medium Exudate Type: Serosanguineous Exudate Color: red, brown r After Cleansing: No ibrino Yes Wound Bed Granulation Amount: None Present (0%) Exposed Structure Necrotic Amount: Large (67-100%) Fascia Exposed: No Necrotic Quality: Eschar, Adherent Slough Fat Layer (Subcutaneous Tissue) Exposed: Yes Tendon Exposed: No Muscle Exposed: No Joint Exposed: No Bone Exposed: No Treatment Notes KERRILYNN, CURATOLA ([id number]) Wound #2 (Left, Medial Toe Fourth) Notes Santyl, abd, conform; silvercell toe Electronic Signature(s) Signed: 05/18/2019 3:59:27 PM By: Harold Barban Entered By: Harold Barban on 05/18/2019 10:45:14 Nina Perez ([id number]) -------------------------------------------------------------------------------- Vitals Details Patient Name: Nina Perez Date of Service: 05/18/2019 10:45 AM Medical Record Number: [id number] Patient Account Number: [id number] Date of Birth/Sex: 09-01-1940 (78 y.o. F) Treating RN: Cornell Barman Primary Care Shelanda Duvall: Einar Pheasant Other Clinician: Referring Coletta Lockner: Einar Pheasant Treating Ladaysha Soutar/Extender: Tito Dine in Treatment: 11 Vital Signs Time Taken: 10:34 Temperature (F): 98.0 Height (in): 66 Pulse (bpm): 71 Weight (lbs): 230 Respiratory Rate (breaths/min): 18 Body Mass Index (BMI): 37.1 Blood  Pressure (mmHg): 149/83 Reference Range: 80 - 120 mg / dl Electronic Signature(s) Signed: 05/18/2019 3:30:55 PM By: Lorine Bears RCP, RRT, CHT Entered By: Lorine Bears on 05/18/2019 10:37:03  in Volume: -1022.2% Epithelialization: None Tunneling: No Undermining: No Wound Description Classification: Grade 2 Foul Odo Wound Margin: Flat and Intact Slough/F Exudate Amount: Medium Exudate Type: Serosanguineous Exudate Color: red, brown r After Cleansing: No ibrino Yes Wound Bed Granulation Amount: None Present (0%) Exposed Structure Necrotic Amount: Large (67-100%) Fascia Exposed: No Necrotic Quality: Eschar, Adherent Slough Fat Layer (Subcutaneous Tissue) Exposed: Yes Tendon Exposed: No Muscle Exposed: No Joint Exposed: No Bone Exposed: No Treatment Notes KERRILYNN, CURATOLA ([id number]) Wound #2 (Left, Medial Toe Fourth) Notes Santyl, abd, conform; silvercell toe Electronic Signature(s) Signed: 05/18/2019 3:59:27 PM By: Harold Barban Entered By: Harold Barban on 05/18/2019 10:45:14 Nina Perez ([id number]) -------------------------------------------------------------------------------- Vitals Details Patient Name: Nina Perez Date of Service: 05/18/2019 10:45 AM Medical Record Number: [id number] Patient Account Number: [id number] Date of Birth/Sex: 09-01-1940 (78 y.o. F) Treating RN: Cornell Barman Primary Care Shelanda Duvall: Einar Pheasant Other Clinician: Referring Coletta Lockner: Einar Pheasant Treating Ladaysha Soutar/Extender: Tito Dine in Treatment: 11 Vital Signs Time Taken: 10:34 Temperature (F): 98.0 Height (in): 66 Pulse (bpm): 71 Weight (lbs): 230 Respiratory Rate (breaths/min): 18 Body Mass Index (BMI): 37.1 Blood  Pressure (mmHg): 149/83 Reference Range: 80 - 120 mg / dl Electronic Signature(s) Signed: 05/18/2019 3:30:55 PM By: Lorine Bears RCP, RRT, CHT Entered By: Lorine Bears on 05/18/2019 10:37:03

## 2019-05-18 NOTE — Progress Notes (Signed)
UNICA, KRENZEL ([id number]) Visit Report for 05/04/2019 Debridement Details Patient Name: Nina, Perez Date of Service: 05/04/2019 10:15 AM Medical Record Number: [id number] Patient Account Number: [id number] Date of Birth/Sex: 03-26-1941 (78 y.o. F) Treating RN: Cornell Barman Primary Care Provider: Einar Pheasant Other Clinician: Referring Provider: Einar Pheasant Treating Provider/Extender: Tito Dine in Treatment: 9 Debridement Performed for Wound #1 Left,Lateral Foot Assessment: Performed By: Physician Ricard Dillon, MD Debridement Type: Chemical/Enzymatic/Mechanical Agent Used: Santyl Severity of Tissue Pre Limited to breakdown of skin Debridement: Level of Consciousness (Pre- Awake and Alert procedure): Pre-procedure Verification/Time Yes - 10:48 Out Taken: Start Time: 10:48 Pain Control: Lidocaine Instrument: Other : tongue blade Bleeding: None End Time: 10:49 Response to Treatment: Procedure was tolerated well Level of Consciousness Awake and Alert (Post-procedure): Post Debridement Measurements of Total Wound Length: (cm) 1.5 Width: (cm) 1 Depth: (cm) 0.3 Volume: (cm) 0.353 Character of Wound/Ulcer Post Debridement: Requires Further Debridement Severity of Tissue Post Debridement: Limited to breakdown of skin Post Procedure Diagnosis Same as Pre-procedure Electronic Signature(s) Signed: 05/04/2019 5:48:31 PM By: Linton Ham MD Signed: 05/18/2019 4:51:46 PM By: Gretta Cool, BSN, RN, CWS, Kim RN, BSN Entered By: Gretta Cool, BSN, RN, CWS, Kim on 05/04/2019 10:49:12 Nina Perez ([id number]) -------------------------------------------------------------------------------- HPI Details Patient Name: Nina Perez Date of Service: 05/04/2019 10:15 AM Medical Record Number: [id number] Patient Account Number: [id number] Date of Birth/Sex: Apr 29, 1941 (79 y.o. F) Treating RN: Cornell Barman Primary Care Provider: Einar Pheasant Other Clinician: Referring  Provider: Einar Pheasant Treating Provider/Extender: Tito Dine in Treatment: 9 History of Present Illness HPI Description: ADMISSION 03/02/2019 Mrs. Nina Perez is a 79 year old woman with a complicated past medical history who presents for review of wound on the left lateral foot for the last 6 weeks. She states she simply looked down and saw a black spot on the left lateral foot and thought this was dirt went to wipe it off and there was an open wound. This is not gotten any better and over the timeframe. She has been using topical triple antibiotic ointment. She has a history of chronic venous insufficiency and has had wounds on her lower legs related to this but never on her feet. She is known to Dr. Aleda Grana at [facility] in [place] and she went to see him about this. He did not order any specific tests and said that he was not exactly sure what it because this per the patient's description. She was put in an [employer] but it looked as though this was stopped on 10/8. She had a week's worth of doxycycline as well. Patient states the area is very painful rating it at a 10 out of 10. She does not really describe claudication element. 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 X-ray we ordered last week was negative for any bony abnormality The patient had venous studies on 06/16/2018 in the [facility] system. There was no thrombosis and they did not comment on reflux. She has had previous vein surgery ABIs in this clinic were 0.96 on the right and 0.88 on the left 10/28; the patient arrives with a small punched out area on the left lateral foot. This appears somewhat better than last week but still with a nonviable surface. I did not feel when I admitted her last week that she had  significant arterial disease. She  definitely has chronic venous insufficiency and multiple other rheumatologic issues including rheumatoid arthritis, scleroderma crest syndrome but the exact etiology of this wound is not really clear in its location. We used Santyl 11/4; small punched out area on the left anterior lateral foot. The exact reason for this wound is not really clear. She has venous disease however I am not certain that this explains the presentation like this. She saw Dr. Aleda Grana of [facility] before she came into this clinic. She does have other possible alternatives for this wound. I do not believe she has an arterial issue however she does have rheumatoid arthritis, crest syndrome etc. I have been concerned to actually biopsy this however unless of course it worsens 11/11; small punched out area on the left anterior lateral foot. The cause of this is never really clear. She has venous disease previous venous stripping and was followed by Dr. Aleda Grana of [facility]. She saw Dr. Ozella Almond office in 2018 once. She does not want to go back there. She was not felt to have a DVT or evidence of reflux at that time. Although it was difficult to sort out I really do not believe this lady has had formal arterial studies. The area on the left lateral foot still has debris requiring debridement. This is disappointing she has been using Santyl. 11/18; small punched out area on the left lateral foot. Using Santyl and certainly the adherent debris at the base of this is a lot better. She is going for arterial studies later today at Dr. Gwenlyn Found and Dr. Cyndi Lennert office in [place]. If these show adequate blood flow I think we need to reach out to her rheumatologist about discontinuing the methotrexate. The cause of this wound has never really been clear to me. She does however have a complicated rheumatologic history [see above] 04/06/2019 on evaluation today patient appears  to be doing well at this point with regard to her wound. The size is really about the same as previously noted. With that being said this is the first time I seen her but it appears that she has a lot more slough buildup than she did last week when collagen was used. This is something that the patient's case manager nurse also stated to me as well. I really feel like the Santyl likely is going to do better for her. She does have an appointment with Dr. Naida Sleight office on December 1. SHARYON, JANCO ([id number]) 12/2; changed back to sample last week. I looked at the picture and she had a large amount of necrotic debris in the wound surface and slough I certainly agree with this. The wound actually looks better today but still required debridement I took a call from Dr. Gwenlyn Found yesterday. He felt that the patient had a 80% left SFA stenosis although he was not at all sure that the stenosis was the cause of the patient's wound where that the nonhealing state was related to ischemia. We agreed that we would attempt ongoing wound care and that if the wound stalled deteriorated we would consider further interventions including angiography. I should note that the patient pointed out to me today that the only time this really bothers her is when she is lying in bed and the pain is relieved by putting her leg over the edge of the bed. Unfortunately if she did not seem to tell Dr. Gwenlyn Found this 12/9; punched out area on the left lateral foot. The surface of this looks better.  We have been using Santyl she has been changing the dressing. She complains of a lot of pain in the foot specifically the tips of her first and fourth toes. She shows me a small little splinter hemorrhages that look like telangiectasias on the tip of some of her fingers. No doubt this is related to her scleroderma. As I noted on my note from last week;. Dr. Gwenlyn Found was not convinced the SFA stenosis was related to the wound on her foot. He  wanted wound care to see if this would heal on its own without arterial intervention. 12/16; punched out area on the left lateral foot. Still requiring debridement. We have been using Santyl. She is increasing her complaints of pain in the left first and especially the left fourth toe. I also spoke to Dr. Jefm Bryant today. He was okay with holding the methotrexate although he made the point that methotrexate is rarely complicating wound healing at these doses. He also wondered about pyoderma and the possibility of microcalcifications from her crest syndrome. We did an x-ray of her foot there were not any comments about microcalcification. From my point of view this certainly does not look like pyoderma. The first and fourth toes look like ischemic injury. This may be microvascular but I am going to ask Dr. Gwenlyn Found to proceed with revascularization at this point I do not know that there is any other choice here. I will review the angiogram 12/23; after the patient's appointment last week I have been in contact with Dr. Gwenlyn Found. She is now booked for an angiogram on 1/4. She is having increase ischemic breakdown of the left fourth toe and a lot of pain. She has had tramadol ordered by her primary physician Dr. Nicki Reaper. No improvement in the area on her left dorsal foot which was the initial wound Electronic Signature(s) Signed: 05/04/2019 5:48:31 PM By: Linton Ham MD Entered By: Linton Ham on 05/04/2019 12:10:29 Nina Perez ([id number]) -------------------------------------------------------------------------------- Physical Exam Details Patient Name: Nina Perez Date of Service: 05/04/2019 10:15 AM Medical Record Number: [id number] Patient Account Number: [id number] Date of Birth/Sex: 09/18/1940 (78 y.o. F) Treating RN: Cornell Barman Primary Care Provider: Einar Pheasant Other Clinician: Referring Provider: Einar Pheasant Treating Provider/Extender: Tito Dine in Treatment:  9 Constitutional Patient is hypertensive.. Pulse regular and within target range for patient.Marland Kitchen Respirations regular, non-labored and within target range.. Temperature is normal and within the target range for the patient.Marland Kitchen appears in no distress.patient appears uncomfotable. Eyes Conjunctivae clear. No discharge. Respiratory Respiratory effort is easy and symmetric bilaterally. Rate is normal at rest and on room air.. Cardiovascular Pedal pulses are absent on the left. Integumentary (Hair, Skin) Creasing ischemic change in the left fourth greater than left first toe.Marland Kitchen Psychiatric No evidence of depression, anxiety, or agitation. Calm, cooperative, and communicative. Appropriate interactions and affect.. Notes Wound exam oThe original wound on the dorsal foot does not look any different. No debridement done. Still covered to 100% in adherent slough. We are using Santyl oThe increasingly concerning areas on the left fourth toe which appears to be breaking down on the plantar medial aspect. There is also ischemic change on the tip of left first toe. Electronic Signature(s) Signed: 05/04/2019 5:48:31 PM By: Linton Ham MD Entered By: Linton Ham on 05/04/2019 12:14:08 Nina Perez ([id number]) -------------------------------------------------------------------------------- Physician Orders Details Patient Name: Nina Perez Date of Service: 05/04/2019 10:15 AM Medical Record Number: [id number] Patient Account Number: [id number] Date of Birth/Sex: Aug 23, 1940 (79 y.o. F) Treating RN:  Cornell Barman Primary Care Provider: Einar Pheasant Other Clinician: Referring Provider: Einar Pheasant Treating Provider/Extender: Tito Dine in Treatment: 9 Verbal / Phone Orders: No Diagnosis Coding Wound Cleansing Wound #1 Left,Lateral Foot o Clean wound with Normal Saline. o May Shower, gently pat wound dry prior to applying new dressing. Wound #2 Left,Medial Toe  Fourth o Clean wound with Normal Saline. o May Shower, gently pat wound dry prior to applying new dressing. Anesthetic (add to Medication List) Wound #1 Left,Lateral Foot o Topical Lidocaine 4% cream applied to wound bed prior to debridement (In Clinic Only). Wound #2 Left,Medial Toe Fourth o Topical Lidocaine 4% cream applied to wound bed prior to debridement (In Clinic Only). Primary Wound Dressing Wound #1 Left,Lateral Foot o Santyl Ointment Wound #2 Left,Medial Toe Fourth o Silver Alginate Secondary Dressing Wound #1 Left,Lateral Foot o ABD and Kerlix/Conform Wound #2 Left,Medial Toe Fourth o ABD and Kerlix/Conform Dressing Change Frequency Wound #1 Left,Lateral Foot o Change dressing every day. Wound #2 Left,Medial Toe Fourth o Change dressing every day. Follow-up Appointments Wound #1 Left,Lateral Foot o Return Appointment in 1 week. Wound #2 Left,Medial Toe Fourth o Return Appointment in 1 week. JAYSON, MCNEASE TMarland Kitchen ([id number]) Edema Control Wound #1 Left,Lateral Foot o Patient to wear own compression stockings o Elevate legs to the level of the heart and pump ankles as often as possible Wound #2 Left,Medial Toe Fourth o Patient to wear own compression stockings o Elevate legs to the level of the heart and pump ankles as often as possible Off-Loading Wound #1 Left,Lateral Foot o Other: - keep pressure off of wound area Wound #2 Left,Medial Toe Fourth o Other: - keep pressure off of wound area Additional Orders / Instructions Wound #1 Left,Lateral Foot o Activity as tolerated Wound #2 Left,Medial Toe Fourth o Activity as tolerated Medications-please add to medication list. Wound #1 Left,Lateral Foot o Santyl Enzymatic Ointment Electronic Signature(s) Signed: 05/04/2019 5:48:31 PM By: Linton Ham MD Signed: 05/18/2019 4:51:46 PM By: Gretta Cool, BSN, RN, CWS, Kim RN, BSN Entered By: Gretta Cool, BSN, RN, CWS, Kim on 05/04/2019  10:47:26 Nina Perez ([id number]) -------------------------------------------------------------------------------- Problem List Details Patient Name: Nina Perez Date of Service: 05/04/2019 10:15 AM Medical Record Number: [id number] Patient Account Number: [id number] Date of Birth/Sex: [birth year] (79 y.o. 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 E11.621 Type 2 diabetes mellitus with foot ulcer 03/02/2019 No Yes I87.323 Chronic venous hypertension (idiopathic) with inflammation of 03/02/2019 No Yes bilateral lower extremity L97.528 Non-pressure chronic ulcer of other part of left foot with other 03/02/2019 No Yes specified severity M05.7A Rheumatoid arthritis with rheumatoid factor of other specified 03/02/2019 No Yes site without organ or systems involvement Inactive Problems Resolved Problems Electronic Signature(s) Signed: 05/04/2019 5:48:31 PM By: Linton Ham MD Entered By: Linton Ham on 05/04/2019 12:06:53 Nina Perez ([id number]) -------------------------------------------------------------------------------- Progress Note Details Patient Name: Nina Perez Date of Service: 05/04/2019 10:15 AM Medical Record Number: [id number] Patient Account Number: [id number] Date of Birth/Sex: [birth year] (78 y.o. F) Treating RN: Cornell Barman Primary Care Provider: Einar Pheasant Other Clinician: Referring Provider: Einar Pheasant Treating Provider/Extender: Tito Dine in Treatment: 9 Subjective History of Present Illness (HPI) ADMISSION 03/02/2019 Mrs. Wiemer is a 79 year old woman with a complicated past medical  history who  presents for review of wound on the left lateral foot for the last 6 weeks. She states she simply looked down and saw a black spot on the left lateral foot and thought this was dirt went to wipe it off and there was an open wound. This is not gotten any better and over the timeframe. She has been using topical triple antibiotic ointment. She has a history of chronic venous insufficiency and has had wounds on her lower legs related to this but never on her feet. She is known to Dr. Aleda Grana at [facility] in [place] and she went to see him about this. He did not order any specific tests and said that he was not exactly sure what it because this per the patient's description. She was put in an [employer] but it looked as though this was stopped on 10/8. She had a week's worth of doxycycline as well. Patient states the area is very painful rating it at a 10 out of 10. She does not really describe claudication element. 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 X-ray we ordered last week was negative for any bony abnormality The patient had venous studies on 06/16/2018 in the [facility] system. There was no thrombosis and they did not comment on reflux. She has had previous vein surgery ABIs in this clinic were 0.96 on the right and 0.88 on the left 10/28; the patient arrives with a small punched out area on the left lateral foot. This appears somewhat better than last week but still with a nonviable surface. I did not feel when I admitted her last week that she had significant arterial disease. She definitely has chronic venous insufficiency and multiple other rheumatologic issues including rheumatoid arthritis, scleroderma crest syndrome but the exact etiology of this wound is not really clear in its location.  We used Santyl 11/4; small punched out area on the left anterior lateral foot. The exact reason for this wound is not really clear. She has venous disease however I am not certain that this explains the presentation like this. She saw Dr. Aleda Grana of [facility] before she came into this clinic. She does have other possible alternatives for this wound. I do not believe she has an arterial issue however she does have rheumatoid arthritis, crest syndrome etc. I have been concerned to actually biopsy this however unless of course it worsens 11/11; small punched out area on the left anterior lateral foot. The cause of this is never really clear. She has venous disease previous venous stripping and was followed by Dr. Aleda Grana of [facility]. She saw Dr. Ozella Almond office in 2018 once. She does not want to go back there. She was not felt to have a DVT or evidence of reflux at that time. Although it was difficult to sort out I really do not believe this lady has had formal arterial studies. The area on the left lateral foot still has debris requiring debridement. This is disappointing she has been using Santyl. 11/18; small punched out area on the left lateral foot. Using Santyl and certainly the adherent debris at the base of this is a lot better. She is going for arterial studies later today at Dr. Gwenlyn Found and Dr. Cyndi Lennert office in [place]. If these show adequate blood flow I think we need to  reach out to her rheumatologist about discontinuing the methotrexate. The cause of this wound has never really been clear to me. She does however have a complicated rheumatologic history [see above] 04/06/2019 on evaluation today patient appears to be doing well at this point with regard to her wound. The size is really about the same as previously noted. With that being said this is the first time I seen her but it appears that she has a lot more slough buildup than she did last week when  collagen was used. This is something that the patient's case manager nurse also stated to me as well. I really feel like the Santyl likely is going to do better for her. She does have an appointment with Dr. Edison Pace, Nadara Mode ([id number]) [facility] office on December 1. 12/2; changed back to sample last week. I looked at the picture and she had a large amount of necrotic debris in the wound surface and slough I certainly agree with this. The wound actually looks better today but still required debridement I took a call from Dr. Gwenlyn Found yesterday. He felt that the patient had a 80% left SFA stenosis although he was not at all sure that the stenosis was the cause of the patient's wound where that the nonhealing state was related to ischemia. We agreed that we would attempt ongoing wound care and that if the wound stalled deteriorated we would consider further interventions including angiography. I should note that the patient pointed out to me today that the only time this really bothers her is when she is lying in bed and the pain is relieved by putting her leg over the edge of the bed. Unfortunately if she did not seem to tell Dr. Gwenlyn Found this 12/9; punched out area on the left lateral foot. The surface of this looks better. We have been using Santyl she has been changing the dressing. She complains of a lot of pain in the foot specifically the tips of her first and fourth toes. She shows me a small little splinter hemorrhages that look like telangiectasias on the tip of some of her fingers. No doubt this is related to her scleroderma. As I noted on my note from last week;. Dr. Gwenlyn Found was not convinced the SFA stenosis was related to the wound on her foot. He wanted wound care to see if this would heal on its own without arterial intervention. 12/16; punched out area on the left lateral foot. Still requiring debridement. We have been using Santyl. She is increasing her complaints of pain in the left first  and especially the left fourth toe. I also spoke to Dr. Jefm Bryant today. He was okay with holding the methotrexate although he made the point that methotrexate is rarely complicating wound healing at these doses. He also wondered about pyoderma and the possibility of microcalcifications from her crest syndrome. We did an x-ray of her foot there were not any comments about microcalcification. From my point of view this certainly does not look like pyoderma. The first and fourth toes look like ischemic injury. This may be microvascular but I am going to ask Dr. Gwenlyn Found to proceed with revascularization at this point I do not know that there is any other choice here. I will review the angiogram 12/23; after the patient's appointment last week I have been in contact with Dr. Gwenlyn Found. She is now booked for an angiogram on 1/4. She is having increase ischemic breakdown of the left fourth toe and a lot of  pain. She has had tramadol ordered by her primary physician Dr. Nicki Reaper. No improvement in the area on her left dorsal foot which was the initial wound Objective Constitutional Patient is hypertensive.. Pulse regular and within target range for patient.Marland Kitchen Respirations regular, non-labored and within target range.. Temperature is normal and within the target range for the patient.Marland Kitchen appears in no distress.patient appears uncomfotable. Vitals Time Taken: 10:13 AM, Height: 66 in, Weight: 230 lbs, BMI: 37.1, Temperature: 98.3 F, Pulse: 76 bpm, Respiratory Rate: 16 breaths/min, Blood Pressure: 176/86 mmHg. Eyes Conjunctivae clear. No discharge. Respiratory Respiratory effort is easy and symmetric bilaterally. Rate is normal at rest and on room air.. Cardiovascular Pedal pulses are absent on the left. CRESHA, LEIDER TMarland Kitchen ([id number]) Psychiatric No evidence of depression, anxiety, or agitation. Calm, cooperative, and communicative. Appropriate interactions and affect.. General Notes: Wound exam The original wound  on the dorsal foot does not look any different. No debridement done. Still covered to 100% in adherent slough. We are using Santyl The increasingly concerning areas on the left fourth toe which appears to be breaking down on the plantar medial aspect. There is also ischemic change on the tip of left first toe. Integumentary (Hair, Skin) Creasing ischemic change in the left fourth greater than left first toe.. Wound #1 status is Open. Original cause of wound was Gradually Appeared. The wound is located on the Left,Lateral Foot. The wound measures 1.5cm length x 1cm width x 0.3cm depth; 1.178cm^2 area and 0.353cm^3 volume. There is Fat Layer (Subcutaneous Tissue) Exposed exposed. There is no tunneling or undermining noted. There is a medium amount of serosanguineous drainage noted. The wound margin is flat and intact. There is small (1-33%) red granulation within the wound bed. There is a large (67-100%) amount of necrotic tissue within the wound bed including Eschar and Adherent Slough. Wound #2 status is Open. Original cause of wound was Gradually Appeared. The wound is located on the Left,Medial Toe Fourth. The wound measures 1cm length x 0.8cm width x 0.1cm depth; 0.628cm^2 area and 0.063cm^3 volume. There is Fat Layer (Subcutaneous Tissue) Exposed exposed. There is no tunneling or undermining noted. There is a medium amount of serosanguineous drainage noted. The wound margin is flat and intact. There is small (1-33%) red granulation within the wound bed. There is a large (67-100%) amount of necrotic tissue within the wound bed including Eschar and Adherent Slough. 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 There was a Chemical/Enzymatic/Mechanical debridement performed by Ricard Dillon, MD. With the following instrument(s): tongue blade after achieving pain control using Lidocaine. Agent used was [employer]. A time out was conducted at 10:48, prior to the start of the procedure. There was no bleeding. The procedure was tolerated well. Post Debridement Measurements: 1.5cm length x 1cm width x 0.3cm depth; 0.353cm^3 volume. Character of Wound/Ulcer Post Debridement requires further debridement. Severity of Tissue Post Debridement is: Limited to breakdown of skin. Post procedure Diagnosis Wound #1: Same as Pre-Procedure KACIA, STREIFEL ([id number]) Plan Wound Cleansing: Wound #1  Left,Lateral Foot: Clean wound with Normal Saline. May Shower, gently pat wound dry prior to applying new dressing. Wound #2 Left,Medial Toe Fourth: Clean wound with Normal Saline. May Shower, gently pat wound dry prior to applying new dressing. Anesthetic (add to Medication List): Wound #1 Left,Lateral Foot: Topical Lidocaine 4% cream applied to wound bed prior to debridement (In Clinic Only). Wound #2 Left,Medial Toe Fourth: Topical Lidocaine 4% cream applied to wound bed prior to debridement (In Clinic Only). Primary Wound Dressing: Wound #1 Left,Lateral Foot: Santyl Ointment Wound #2 Left,Medial Toe Fourth: Silver Alginate Secondary Dressing: Wound #1 Left,Lateral Foot: ABD and Kerlix/Conform Wound #2 Left,Medial Toe Fourth: ABD and Kerlix/Conform Dressing Change Frequency: Wound #1 Left,Lateral Foot: Change dressing every day. Wound #2 Left,Medial Toe Fourth: Change dressing every  day. Follow-up Appointments: Wound #1 Left,Lateral Foot: Return Appointment in 1 week. Wound #2 Left,Medial Toe Fourth: Return Appointment in 1 week. Edema Control: Wound #1 Left,Lateral Foot: Patient to wear own compression stockings Elevate legs to the level of the heart and pump ankles as often as possible Wound #2 Left,Medial Toe Fourth: Patient to wear own compression stockings Elevate legs to the level of the heart and pump ankles as often as possible Off-Loading: Wound #1 Left,Lateral Foot: Other: - keep pressure off of wound area Wound #2 Left,Medial Toe Fourth: Other: - keep pressure off of wound area Additional Orders / Instructions: Wound #1 Left,Lateral Foot: Activity as tolerated Wound #2 Left,Medial Toe Fourth: Activity as tolerated Medications-please add to medication list.: Wound #1 Left,Lateral Foot: Santyl Enzymatic Ointment ELLIONA, DUFFER T. ([id number]) #1 Silver alginate to the first and fourth toes 2. Continue Santyl to the dorsal left foot. 3. I am awaiting for angiogram on 1/4 by Dr. Gwenlyn Found. Hopefully we can address the occlusion she had in the superficial femoral artery and see if there is anything else that can be done distally. 4. I am prepared to biopsy the left dorsal foot but not until we see how revascularization affects this. 5. Pain in the left fourth toe. I have asked her to recheck with her primary physician Dr. Nicki Reaper to see if the stronger albeit mild narcotic like hydrocodone may be effective Electronic Signature(s) Signed: 05/04/2019 5:48:31 PM By: Linton Ham MD Entered By: Linton Ham on 05/04/2019 12:16:55 Nina Perez ([id number]) -------------------------------------------------------------------------------- [place] Details Patient Name: Nina Perez Date of Service: 05/04/2019 Medical Record Number: [id number] Patient Account Number: [id number] Date of Birth/Sex: Dec 02, 1940 (78 y.o. F) Treating RN: Cornell Barman Primary Care  Provider: Einar Pheasant Other Clinician: Referring Provider: Einar Pheasant Treating Provider/Extender: Tito Dine in Treatment: 9 Diagnosis Coding ICD-10 Codes Code Description E11.52 Type 2 diabetes mellitus with diabetic peripheral angiopathy with gangrene E11.621 Type 2 diabetes mellitus with foot ulcer I87.323 Chronic venous hypertension (idiopathic) with inflammation of bilateral lower extremity L97.528 Non-pressure chronic ulcer of other part of left foot with other specified severity M05.7A Rheumatoid arthritis with rheumatoid factor of other specified site without organ or systems involvement Facility Procedures CPT4 Code: [id number] Description: [phone] - DEBRIDE W/O ANES NON SELECT Modifier: Quantity: 1 Physician Procedures CPT4 Code Description: [id number] - WC PHYS LEVEL 3 - EST PT ICD-10 Diagnosis Description E11.52 Type 2 diabetes mellitus with diabetic peripheral angiopathy wit L97.528 Non-pressure chronic ulcer of other part of left foot with other Modifier: h gangrene specified severi Quantity: 1 ty Electronic Signature(s) Signed: 05/04/2019 5:48:31 PM By: Linton Ham MD Entered By: Linton Ham on 05/04/2019 12:17:40

## 2019-05-19 ENCOUNTER — Other Ambulatory Visit: Payer: Self-pay | Admitting: Internal Medicine

## 2019-05-23 ENCOUNTER — Other Ambulatory Visit: Payer: Self-pay

## 2019-05-23 ENCOUNTER — Ambulatory Visit (INDEPENDENT_AMBULATORY_CARE_PROVIDER_SITE_OTHER): Payer: Medicare Other | Admitting: Internal Medicine

## 2019-05-23 DIAGNOSIS — E7800 Pure hypercholesterolemia, unspecified: Secondary | ICD-10-CM | POA: Diagnosis not present

## 2019-05-23 DIAGNOSIS — I10 Essential (primary) hypertension: Secondary | ICD-10-CM | POA: Diagnosis not present

## 2019-05-23 DIAGNOSIS — I739 Peripheral vascular disease, unspecified: Secondary | ICD-10-CM

## 2019-05-23 DIAGNOSIS — D696 Thrombocytopenia, unspecified: Secondary | ICD-10-CM

## 2019-05-23 DIAGNOSIS — L97529 Non-pressure chronic ulcer of other part of left foot with unspecified severity: Secondary | ICD-10-CM

## 2019-05-23 DIAGNOSIS — E1159 Type 2 diabetes mellitus with other circulatory complications: Secondary | ICD-10-CM | POA: Diagnosis not present

## 2019-05-23 DIAGNOSIS — K2100 Gastro-esophageal reflux disease with esophagitis, without bleeding: Secondary | ICD-10-CM

## 2019-05-23 DIAGNOSIS — M069 Rheumatoid arthritis, unspecified: Secondary | ICD-10-CM

## 2019-05-23 MED ORDER — TRAMADOL HCL 50 MG PO TABS: 50.0000 mg | ORAL_TABLET | Freq: Two times a day (BID) | ORAL | 0 refills | Status: DC | PRN

## 2019-05-23 MED ORDER — GABAPENTIN 100 MG PO CAPS: 100.0000 mg | ORAL_CAPSULE | Freq: Three times a day (TID) | ORAL | 1 refills | Status: DC

## 2019-05-23 NOTE — Progress Notes (Signed)
Patient ID: Nina Perez, female   DOB: Sep 07, 1940, 79 y.o.   MRN: [id number]   Virtual Visit via telephone Note  This visit type was conducted due to national recommendations for restrictions regarding the COVID-19 pandemic (e.g. social distancing).  This format is felt to be most appropriate for this patient at this time.  All issues noted in this document were discussed and addressed.  No physical exam was performed (except for noted visual exam findings with Video Visits).   I connected with Edgardo Roys by telephone and verified that I am speaking with the correct person using two identifiers. Location patient: home Location provider: work Persons participating in the telephone visit: patient, provider  I discussed the limitations, risks, security and privacy concerns of performing an evaluation and management service by telephone and the availability of in person appointments.  The patient expressed understanding and agreed to proceed.   Reason for visit: scheduled follow up.    HPI: She has been followed at wound center for lower extremity wound.  Reports this is "filling in".  She has seen Dr Gwenlyn Found (cardiology) for evaluation of PVD.  Had ABIs - left ABI .83 with a high frequency signal in mid left SFA.  She recently developed discoloration of her left first and fourth toes with increased pain.  Is s/p balloon angioplasty followed by drug coated balloon angioplasty mid left SFA.  States she cannot tell a difference in the pain.  Increased pain.   Keeps her up at night.  Taking tramadol.  Describes as a stabbing/burning pain.  Walking aggravates.  Decreased mobility.  States needs something more for pain.  Also request referral to podiatry for evaluation.  She is eating.  No nausea or vomiting reported.  No chest pain.  Breathing stable.     ROS: See pertinent positives and negatives per HPI.  Past Medical History:  Diagnosis Date  . Barrett's esophagus   . Cancer ([place])    skin ca  .  Diabetes mellitus ([place])   . Gout   . History of colon cancer    adenomatous polyps  . Hypercholesterolemia   . Hypertension   . Inflammatory arthritis   . Osteoarthritis    knees, spine  . Phlebitis    x2 (with pregnancy)  . 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 Pulmonary hypertension ([place])   . Reflux esophagitis   . Rheumatoid arthritis(714.0)    positive RF, FANA, RNP, negative CCP ab and anti DNA,.  neg anti-SCL 70  . 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 ABDOMINAL AORTOGRAM W/LOWER EXTREMITY Left 05/16/2019   Procedure: ABDOMINAL AORTOGRAM W/LOWER EXTREMITY;  Surgeon: Lorretta Harp, MD;  Location: [facility];  Service: Cardiovascular;  Laterality: Left;  . BREAST CYST ASPIRATION Left    neg  . PERIPHERAL VASCULAR BALLOON ANGIOPLASTY Left 05/16/2019   Procedure: PERIPHERAL VASCULAR BALLOON ANGIOPLASTY;  Surgeon: Lorretta Harp, MD;  Location: [facility];  Service: Cardiovascular;  Laterality: Left;  SFA  . VEIN LIGATION AND STRIPPING      Family History  Problem Relation Age of Onset  . Arthritis Mother   . Heart attack Father   . Colon cancer Neg Hx   . Breast cancer Neg Hx     SOCIAL HX: reviewed.    Current Outpatient Medications:  .  acetaminophen (TYLENOL) 650 MG CR tablet, Take 650-1,300 mg by mouth  every 8 (eight) hours as needed for pain., Disp: , Rfl:  .  allopurinol (ZYLOPRIM) 300 MG tablet, Take 1 tablet by mouth once daily (Patient taking differently: Take 300 mg by mouth daily. ), Disp: 90 tablet, Rfl: 0 .  amLODipine (NORVASC) 10 MG tablet, TAKE 1 TABLET BY MOUTH ONCE DAILY (Patient taking differently: Take 10 mg by mouth daily. ), Disp: 90 tablet, Rfl: 3 .  aspirin 81 MG tablet, Take 81 mg by mouth daily. Reported on 07/24/2015, Disp: , Rfl:  .  atorvastatin (LIPITOR) 80 MG tablet, Take 1 tablet (80 mg total) by mouth daily at 6 PM., Disp: 90 tablet, Rfl:  1 .  clopidogrel (PLAVIX) 75 MG tablet, Take 1 tablet (75 mg total) by mouth daily with breakfast., Disp: 90 tablet, Rfl: 2 .  fluticasone (FLONASE) 50 MCG/ACT nasal spray, Place 2 sprays into the nose daily., Disp: 16 g, Rfl: 1 .  folic acid (FOLVITE) 1 MG tablet, Take 1 mg by mouth daily., Disp: , Rfl:  .  gabapentin (NEURONTIN) 100 MG capsule, Take 1 capsule (100 mg total) by mouth 3 (three) times daily., Disp: 90 capsule, Rfl: 1 .  glucose blood (BAYER CONTOUR TEST) test strip, USE  STRIP TO CHECK GLUCOSE TWICE DAILY. Dx: E11.9, Disp: 100 each, Rfl: 12 .  losartan (COZAAR) 100 MG tablet, Take 1 tablet by mouth once daily (Patient taking differently: Take 100 mg by mouth daily. ), Disp: 90 tablet, Rfl: 1 .  metFORMIN (GLUCOPHAGE) 500 MG tablet, Take 1 tablet by mouth once daily (Patient taking differently: Take 500 mg by mouth daily with breakfast. Give w/food.), Disp: 90 tablet, Rfl: 0 .  methotrexate 2.5 MG tablet, Take 10 mg by mouth every Wednesday. , Disp: , Rfl:  .  metoprolol succinate (TOPROL-XL) 50 MG 24 hr tablet, TAKE 1 TABLET BY MOUTH ONCE DAILY IMMEDIATELY  FOLLOWING  A  MEAL, Disp: 90 tablet, Rfl: 0 .  MICROLET LANCETS MISC, Check sugar once daily, [employer] Microlet Lancets. Dx E11.9, Disp: 100 each, Rfl: 12 .  pantoprazole (PROTONIX) 40 MG tablet, Take 1 tablet (40 mg total) by mouth daily., Disp: 30 tablet, Rfl: 1 .  spironolactone (ALDACTONE) 25 MG tablet, Take 1 tablet by mouth once daily (Patient taking differently: Take 25 mg by mouth daily. ), Disp: 90 tablet, Rfl: 1 .  traMADol (ULTRAM) 50 MG tablet, Take 1 tablet (50 mg total) by mouth 2 (two) times daily as needed (pain.)., Disp: 60 tablet, Rfl: 0  EXAM:  GENERAL: alert. Sounds to be in no acute distress.  Answering questions appropriately.    PSYCH/NEURO: pleasant and cooperative, no obvious depression or anxiety, speech and thought processing grossly intact  ASSESSMENT AND PLAN:  Discussed the following assessment  and plan:  Diabetes mellitus Low carb diet and exercise.  Follow met b and a1c.    GERD (gastroesophageal reflux disease) No upper symptoms reported.   Hypercholesterolemia Was recently given rx for statin medication.  Had questions about starting.  Discussed importance of starting cholesterol medication.  Have discussed calculated cholesterol risk.  She will start medication. Follow lipid panel and liver function tests.    Hypertension Blood pressure has been under reasonable control.  Continue current medication regimen.  Follow pressures.  Follow metabolic panel.   Rheumatoid arthritis On MTX.  Followed by Dr Jefm Bryant.   Thrombocytopenia (HCC) Platelet count 05/17/19 - decreased.  Recheck cbc.   Non-healing ulcer of left foot ([place]) Being followed by wound clinic.  Started to  improve per pt.  Continue f/u at wound clinic.   PAD (peripheral artery disease) ([place]) Had recent acute discoloration of toe as outlined.  Saw Dr Gwenlyn Found.  Is s/p angioplasty as outlined.  Still with increased pain.  Taking tramadol.  Describes burning pain.  Will add gabapentin 194m tid.  Follow.  Pt request referral to podiatry.  She also request to get established with cardiology here in town.  Request to see Dr FUbaldo Glassing     Orders Placed This Encounter  Procedures  . Ambulatory referral to Podiatry    Referral Priority:   Routine    Referral Type:   Consultation    Referral Reason:   Specialty Services Required    Requested Specialty:   Podiatry    Number of Visits Requested:   1  . Ambulatory referral to Cardiology    Referral Priority:   Routine    Referral Type:   Consultation    Referral Reason:   Specialty Services Required    Requested Specialty:   Cardiology    Number of Visits Requested:   1    Meds ordered this encounter  Medications  . traMADol (ULTRAM) 50 MG tablet    Sig: Take 1 tablet (50 mg total) by mouth 2 (two) times daily as needed (pain.).    Dispense:  60 tablet    Refill:  0    . gabapentin (NEURONTIN) 100 MG capsule    Sig: Take 1 capsule (100 mg total) by mouth 3 (three) times daily.    Dispense:  90 capsule    Refill:  1     I discussed the assessment and treatment plan with the patient. The patient was provided an opportunity to ask questions and all were answered. The patient agreed with the plan and demonstrated an understanding of the instructions.   The patient was advised to call back or seek an in-person evaluation if the symptoms worsen or if the condition fails to improve as anticipated.  I provided 30 minutes of non-face-to-face time during this encounter.   CEinar Pheasant MD

## 2019-05-24 ENCOUNTER — Inpatient Hospital Stay (HOSPITAL_COMMUNITY): Admission: RE | Admit: 2019-05-24 | Payer: Medicare Other | Source: Ambulatory Visit

## 2019-05-24 ENCOUNTER — Encounter (HOSPITAL_COMMUNITY): Payer: Medicare Other

## 2019-05-25 ENCOUNTER — Other Ambulatory Visit: Payer: Self-pay

## 2019-05-25 ENCOUNTER — Encounter: Payer: Medicare Other | Admitting: Internal Medicine

## 2019-05-25 DIAGNOSIS — E11621 Type 2 diabetes mellitus with foot ulcer: Secondary | ICD-10-CM | POA: Diagnosis not present

## 2019-05-25 NOTE — Progress Notes (Addendum)
Nina Perez ([id number]) Visit Report for 05/25/2019 Arrival Information Details Patient Name: Nina Perez Date of Service: 05/25/2019 10:30 AM Medical Record Number: [id number] Patient Account Number: [id number] Date of Birth/Sex: October 25, 1940 (79 y.o. F) Treating Perez: Nina Perez Primary Care Nina Perez: Nina Perez Other Clinician: Referring Nina Perez: Nina Perez Treating Female Iafrate/Extender: Nina Perez in Treatment: 12 Visit Information History Since Last Visit Added or deleted any medications: No Patient Arrived: Wheel Chair Any new allergies or adverse reactions: No Arrival Time: 10:26 Had a fall or experienced change in No Accompanied By: husband activities of daily living that may affect Transfer Assistance: Manual risk of falls: Patient Identification Verified: Yes Signs or symptoms of abuse/neglect since last visito No Secondary Verification Process Completed: Yes Hospitalized since last visit: No Implantable device outside of the clinic excluding No cellular tissue based products placed in the center since last visit: Has Dressing in Place as Prescribed: Yes Has Compression in Place as Prescribed: Yes Pain Present Now: No Electronic Signature(s) Signed: 05/25/2019 12:09:48 PM By: Nina Perez RCP, RRT, CHT Entered By: Nina Perez on 05/25/2019 10:28:05 Nina Perez ([id number]) -------------------------------------------------------------------------------- Encounter Discharge Information Details Patient Name: Nina Perez Date of Service: 05/25/2019 10:30 AM Medical Record Number: [id number] Patient Account Number: [id number] Date of Birth/Sex: [birth year] (79 y.o. F) Treating Perez: Nina Perez Primary Care Nina Perez: Nina Perez Other Clinician: Referring Nina Perez: Nina Perez Treating Nina Perez/Extender: Nina Perez in Treatment: 12 Encounter Discharge Information Items Post Procedure  Vitals Discharge Condition: Stable Temperature (F): 97.6 Ambulatory Status: Wheelchair Pulse (bpm): 67 Discharge Destination: Home Respiratory Rate (breaths/min): 16 Transportation: Private Auto Blood Pressure (mmHg): 164/72 Accompanied By: spouse Schedule Follow-up Appointment: Yes Clinical Summary of Care: Electronic Signature(s) Signed: 05/25/2019 4:52:26 PM By: Nina Perez, BSN, Perez, CWS, Nina Perez, BSN Entered By: Nina Perez, BSN, Perez, CWS, Nina on 05/25/2019 10:59:23 Nina Perez ([id number]) -------------------------------------------------------------------------------- Lower Extremity Assessment Details Patient Name: Nina Perez Date of Service: 05/25/2019 10:30 AM Medical Record Number: [id number] Patient Account Number: [id number] Date of Birth/Sex: May 30, 1940 (79 y.o. F) Treating Perez: Nina Perez Primary Care Jaidon Sponsel: Nina Perez Other Clinician: Referring Nina Perez: Nina Perez Treating Nina Perez/Extender: Nina Perez in Treatment: 12 Edema Assessment Assessed: [Left: No] [Right: No] Edema: [Left: N] [Right: o] Vascular Assessment Pulses: Dorsalis Pedis Palpable: [Left:Yes] Electronic Signature(s) Signed: 05/25/2019 3:13:50 PM By: Nina Perez Entered By: Nina Perez on 05/25/2019 10:35:41 Nina Perez ([id number]) -------------------------------------------------------------------------------- Multi Wound Chart Details Patient Name: Nina Perez Date of Service: 05/25/2019 10:30 AM Medical Record Number: [id number] Patient Account Number: [id number] Date of Birth/Sex: 11-Mar-1941 (79 y.o. 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 Extremity Comorbid History: Hypertension, Type II Hypertension, Type II N/A Diabetes, Scleroderma, Gout, Diabetes, Scleroderma, Gout, Rheumatoid Arthritis Rheumatoid Arthritis Date Acquired: 01/17/2019 05/02/2019 N/A Perez of Treatment: 12 3 N/A Wound Status: Open Open N/A Measurements L x W x D 1.4x1x0.2 1.8x6x0.1 N/A (cm) Area (cm) : 1.1 8.482 N/A Volume (cm) : 0.22 0.848 N/A % Reduction in Area: -11.10% -1250.60% N/A % Reduction in Volume: -11.10% -  1246.00% N/A Classification: Grade 1 Grade 2 N/A Exudate Amount: Medium Medium N/A Exudate Type: Serosanguineous Serosanguineous N/A Exudate Color: red, brown red, brown N/A Wound Margin: Flat and Intact Flat and Intact N/A Granulation Amount: Small (1-33%) None Present (0%) N/A Granulation Quality: Red N/A N/A Necrotic Amount: Large (67-100%) Large (67-100%) N/A Necrotic Tissue: Adherent Slough Eschar, Adherent Slough N/A Exposed Structures: Fat Layer (Subcutaneous Fat Layer (Subcutaneous N/A Tissue) Exposed: Yes Tissue) Exposed: Yes Fascia: No Fascia: No Tendon: No Tendon: No Nina Perez ([id number]) Muscle: No Muscle: No Joint: No Joint: No Bone: No Bone: No Epithelialization: None None N/A Debridement: Debridement - Excisional N/A N/A Pre-procedure 10:54 N/A N/A Verification/Time Out Taken: Pain Control: Lidocaine N/A N/A Tissue Debrided: Subcutaneous, Slough N/A N/A Level: Skin/Subcutaneous Tissue N/A N/A Debridement Area (sq cm): 1.4 N/A N/A Instrument: Curette N/A N/A Bleeding: Minimum N/A N/A Hemostasis Achieved: Pressure N/A N/A Debridement Treatment Procedure was tolerated well N/A N/A Response: Post Debridement 1.4x1x0.3 N/A N/A Measurements  L x W x D (cm) Post Debridement Volume: 0.33 N/A N/A (cm) Procedures Performed: Debridement N/A N/A Treatment Notes Wound #1 (Left, Lateral Foot) Notes Santyl, abd, conform; Betadine, silvercell toe Wound #2 (Left, Medial Toe Fourth) Notes Santyl, abd, conform; Betadine, silvercell toe Electronic Signature(s) Signed: 05/25/2019 4:15:01 PM By: Linton Ham MD Entered By: Linton Ham on 05/25/2019 11:24:09 Nina Perez ([id number]) -------------------------------------------------------------------------------- Multi-Disciplinary Care Plan Details Patient Name: Nina Perez Date of Service: 05/25/2019 10:30 AM Medical Record Number: [id number] Patient Account Number: [id number] Date of Birth/Sex: July 06, 1940 (79 y.o. F) Treating Perez: Nina Perez Primary Care Namon Villarin: Nina Perez Other Clinician: Referring Keiandre Cygan: Nina Perez Treating Derius Ghosh/Extender: Nina Perez in Treatment: 12 Active Inactive Electronic Signature(s) Signed: 06/21/2019 11:35:11 AM By: Nina Perez, BSN, Perez, CWS, Nina Perez, BSN Previous Signature: 05/25/2019 4:52:26 PM Version By: Nina Perez, BSN, Perez, CWS, Nina Perez, BSN Entered By: Nina Perez, BSN, Perez, CWS, Nina on 06/21/2019 11:35:11 Nina Perez ([id number]) -------------------------------------------------------------------------------- Pain Assessment Details Patient Name: Nina Perez Date of Service: 05/25/2019 10:30 AM Medical Record Number: [id number] Patient Account Number: [id number] Date of Birth/Sex: 07-28-1940 (79 y.o. F) Treating Perez: Nina Perez Primary Care Mathea Frieling: Nina Perez Other Clinician: Referring Marlisa Caridi: Nina Perez Treating Rama Sorci/Extender: Nina Perez in Treatment: 12 Active Problems Location of Pain Severity and Description of Pain Patient Has Paino No Site Locations Pain Management and Medication Current Pain Management: Electronic Signature(s) Signed: 05/25/2019 12:09:48 PM By: Nina Perez RCP, RRT, CHT Signed: 05/25/2019 4:52:26 PM By: Nina Perez, BSN, Perez, CWS, Nina Perez, BSN Entered By: Nina Perez on 05/25/2019 10:28:13 Nina Perez ([id number]) -------------------------------------------------------------------------------- Patient/Caregiver Education Details Patient Name: Nina Perez Date of Service: 05/25/2019 10:30 AM Medical Record Number: [id number] Patient Account Number: [id number] Date of Birth/Gender: 1940/12/02 (79 y.o. F) Treating Perez: Nina Perez Primary Care Physician: Nina Perez Other Clinician: Referring Physician: Einar Perez Treating Physician/Extender: Nina Perez in Treatment: 12 Education Assessment Education Provided To: Patient Education Topics Provided Welcome To [facility]: Handouts: Welcome To [facility] Methods: Demonstration, Explain/Verbal Responses: State content correctly Wound/Skin Impairment: Handouts: Caring for Your Ulcer Methods: Demonstration, Explain/Verbal Responses: State content correctly Electronic Signature(s) Signed: 05/25/2019 4:52:26 PM By: Nina Perez, BSN, Perez, CWS, Nina Perez, BSN Entered By: Nina Perez, BSN, Perez, CWS, Nina on 05/25/2019 10:58:03 Nina Perez ([id number]) -------------------------------------------------------------------------------- Wound Assessment Details Patient Name: Nina Perez Date of Service: 05/25/2019 10:30 AM Medical Record Number: [id number] Patient Account Number: [id number] Date of  Nina Perez ([id number]) Visit Report for 05/25/2019 Arrival Information Details Patient Name: Nina Perez Date of Service: 05/25/2019 10:30 AM Medical Record Number: [id number] Patient Account Number: [id number] Date of Birth/Sex: October 25, 1940 (79 y.o. F) Treating Perez: Nina Perez Primary Care Nina Perez: Nina Perez Other Clinician: Referring Nina Perez: Nina Perez Treating Female Iafrate/Extender: Nina Perez in Treatment: 12 Visit Information History Since Last Visit Added or deleted any medications: No Patient Arrived: Wheel Chair Any new allergies or adverse reactions: No Arrival Time: 10:26 Had a fall or experienced change in No Accompanied By: husband activities of daily living that may affect Transfer Assistance: Manual risk of falls: Patient Identification Verified: Yes Signs or symptoms of abuse/neglect since last visito No Secondary Verification Process Completed: Yes Hospitalized since last visit: No Implantable device outside of the clinic excluding No cellular tissue based products placed in the center since last visit: Has Dressing in Place as Prescribed: Yes Has Compression in Place as Prescribed: Yes Pain Present Now: No Electronic Signature(s) Signed: 05/25/2019 12:09:48 PM By: Nina Perez RCP, RRT, CHT Entered By: Nina Perez on 05/25/2019 10:28:05 Nina Perez ([id number]) -------------------------------------------------------------------------------- Encounter Discharge Information Details Patient Name: Nina Perez Date of Service: 05/25/2019 10:30 AM Medical Record Number: [id number] Patient Account Number: [id number] Date of Birth/Sex: [birth year] (79 y.o. F) Treating Perez: Nina Perez Primary Care Nina Perez: Nina Perez Other Clinician: Referring Nina Perez: Nina Perez Treating Nina Perez/Extender: Nina Perez in Treatment: 12 Encounter Discharge Information Items Post Procedure  Vitals Discharge Condition: Stable Temperature (F): 97.6 Ambulatory Status: Wheelchair Pulse (bpm): 67 Discharge Destination: Home Respiratory Rate (breaths/min): 16 Transportation: Private Auto Blood Pressure (mmHg): 164/72 Accompanied By: spouse Schedule Follow-up Appointment: Yes Clinical Summary of Care: Electronic Signature(s) Signed: 05/25/2019 4:52:26 PM By: Nina Perez, BSN, Perez, CWS, Nina Perez, BSN Entered By: Nina Perez, BSN, Perez, CWS, Nina on 05/25/2019 10:59:23 Nina Perez ([id number]) -------------------------------------------------------------------------------- Lower Extremity Assessment Details Patient Name: Nina Perez Date of Service: 05/25/2019 10:30 AM Medical Record Number: [id number] Patient Account Number: [id number] Date of Birth/Sex: May 30, 1940 (79 y.o. F) Treating Perez: Nina Perez Primary Care Jaidon Sponsel: Nina Perez Other Clinician: Referring Nina Perez: Nina Perez Treating Nina Perez/Extender: Nina Perez in Treatment: 12 Edema Assessment Assessed: [Left: No] [Right: No] Edema: [Left: N] [Right: o] Vascular Assessment Pulses: Dorsalis Pedis Palpable: [Left:Yes] Electronic Signature(s) Signed: 05/25/2019 3:13:50 PM By: Nina Perez Entered By: Nina Perez on 05/25/2019 10:35:41 Nina Perez ([id number]) -------------------------------------------------------------------------------- Multi Wound Chart Details Patient Name: Nina Perez Date of Service: 05/25/2019 10:30 AM Medical Record Number: [id number] Patient Account Number: [id number] Date of Birth/Sex: 11-Mar-1941 (79 y.o. 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 Extremity Comorbid History: Hypertension, Type II Hypertension, Type II N/A Diabetes, Scleroderma, Gout, Diabetes, Scleroderma, Gout, Rheumatoid Arthritis Rheumatoid Arthritis Date Acquired: 01/17/2019 05/02/2019 N/A Perez of Treatment: 12 3 N/A Wound Status: Open Open N/A Measurements L x W x D 1.4x1x0.2 1.8x6x0.1 N/A (cm) Area (cm) : 1.1 8.482 N/A Volume (cm) : 0.22 0.848 N/A % Reduction in Area: -11.10% -1250.60% N/A % Reduction in Volume: -11.10% -

## 2019-05-25 NOTE — Progress Notes (Signed)
Nina Perez in Treatment: 12 Constitutional Patient is hypertensive.. Pulse regular and within target range for patient.Marland Kitchen Respirations regular, non-labored and within target range.. Temperature is normal and within the target range for the patient.Marland Kitchen appears in no distress. Cardiovascular She has a dorsalis pedis pulse on the left perhaps a faint popliteal but I could not feel the posterior tibial. Notes Wound exam; the original wound on the dorsal foot looks about the same size. Using a #3 curette I removed necrotic debris from the surface this cleans up reasonably well but certainly no epithelialization. oThe left fourth toe has dry gangrene and is demarcating however still a lot of pain.  The right first toe tip is also painful but not quite as dusky as it was a few Perez ago. Electronic Signature(s) Signed: 05/25/2019 4:15:01 PM By: Nina Ham MD Entered By: Nina Perez on 05/25/2019 11:39:48 Nina Perez ([id number]) -------------------------------------------------------------------------------- Physician Orders Details Patient Name: Nina Perez Date of Service: 05/25/2019 10:30 AM Medical Record Number: [id number] Patient Account Number: [id number] Date of Birth/Sex: [birth year] (79 y.o. F) Treating Perez: Nina Perez Primary Care Provider: Einar Perez Other Clinician: Referring Provider: Einar Perez Treating Provider/Extender: Nina Perez in Treatment: 12 Verbal / Phone Orders: No Diagnosis Coding Wound Cleansing Wound #1 Left,Lateral Foot o Clean wound with Normal Saline. o May Shower, gently pat wound dry prior to applying new dressing. Wound #2 Left,Medial Toe Fourth o Clean wound with Normal Saline. o May Shower, gently pat wound dry prior to applying new dressing. Anesthetic (add to Medication List) Wound #1 Left,Lateral Foot o Topical Lidocaine 4% cream applied to wound bed prior to debridement (In Clinic Only). Primary Wound Dressing Wound #1 Left,Lateral Foot o Santyl Ointment Wound #2 Left,Medial Toe Fourth o Other: - betadine Secondary Dressing Wound #1 Left,Lateral Foot o ABD and Kerlix/Conform Wound #2 Left,Medial Toe Fourth o ABD and Kerlix/Conform Dressing Change Frequency Wound #1 Left,Lateral Foot o Change dressing every day. Wound #2 Left,Medial Toe Fourth o Change dressing every day. Follow-up Appointments Wound #1 Left,Lateral Foot o Return Appointment in 1 week. Wound #2 Left,Medial Toe Fourth o Return Appointment in 1 week. Edema Control AEON, TOFTE ([id number]) Wound #1 Left,Lateral Foot o Patient to wear own compression stockings o Elevate legs to the level of the  heart and pump ankles as often as possible Wound #2 Left,Medial Toe Fourth o Patient to wear own compression stockings o Elevate legs to the level of the heart and pump ankles as often as possible Off-Loading Wound #1 Left,Lateral Foot o Other: - keep pressure off of wound area Wound #2 Left,Medial Toe Fourth o Other: - keep pressure off of wound area Additional Orders / Instructions Wound #1 Left,Lateral Foot o Activity as tolerated Wound #2 Left,Medial Toe Fourth o Activity as tolerated Medications-please add to medication list. Wound #1 Left,Lateral Foot o Santyl Enzymatic Ointment Consults o Podiatry - Surgical Consult-Dr. Elvina Perez Electronic Signature(s) Signed: 05/25/2019 4:15:01 PM By: Nina Ham MD Signed: 05/25/2019 4:52:26 PM By: Nina Perez, BSN, Perez, CWS, Nina Perez, BSN Entered By: Nina Perez, BSN, Perez, CWS, Nina Perez on 05/25/2019 10:57:31 Nina Perez ([id number]) -------------------------------------------------------------------------------- Problem List Details Patient Name: Nina Perez Date of Service: 05/25/2019 10:30 AM Medical Record Number: [id number] Patient Account Number: [id number] Date of Birth/Sex: 04/09/1941 (79 y.o. F) Treating Perez: Nina Perez Primary Care Provider: Einar Perez Other Clinician: Referring Provider: Einar Perez Treating Provider/Extender: Nina Perez in Treatment: 12 Active Problems ICD-10 Evaluated Encounter Code Description Active Date  Nina Perez in Treatment: 12 Constitutional Patient is hypertensive.. Pulse regular and within target range for patient.Marland Kitchen Respirations regular, non-labored and within target range.. Temperature is normal and within the target range for the patient.Marland Kitchen appears in no distress. Cardiovascular She has a dorsalis pedis pulse on the left perhaps a faint popliteal but I could not feel the posterior tibial. Notes Wound exam; the original wound on the dorsal foot looks about the same size. Using a #3 curette I removed necrotic debris from the surface this cleans up reasonably well but certainly no epithelialization. oThe left fourth toe has dry gangrene and is demarcating however still a lot of pain.  The right first toe tip is also painful but not quite as dusky as it was a few Perez ago. Electronic Signature(s) Signed: 05/25/2019 4:15:01 PM By: Nina Ham MD Entered By: Nina Perez on 05/25/2019 11:39:48 Nina Perez ([id number]) -------------------------------------------------------------------------------- Physician Orders Details Patient Name: Nina Perez Date of Service: 05/25/2019 10:30 AM Medical Record Number: [id number] Patient Account Number: [id number] Date of Birth/Sex: [birth year] (79 y.o. F) Treating Perez: Nina Perez Primary Care Provider: Einar Perez Other Clinician: Referring Provider: Einar Perez Treating Provider/Extender: Nina Perez in Treatment: 12 Verbal / Phone Orders: No Diagnosis Coding Wound Cleansing Wound #1 Left,Lateral Foot o Clean wound with Normal Saline. o May Shower, gently pat wound dry prior to applying new dressing. Wound #2 Left,Medial Toe Fourth o Clean wound with Normal Saline. o May Shower, gently pat wound dry prior to applying new dressing. Anesthetic (add to Medication List) Wound #1 Left,Lateral Foot o Topical Lidocaine 4% cream applied to wound bed prior to debridement (In Clinic Only). Primary Wound Dressing Wound #1 Left,Lateral Foot o Santyl Ointment Wound #2 Left,Medial Toe Fourth o Other: - betadine Secondary Dressing Wound #1 Left,Lateral Foot o ABD and Kerlix/Conform Wound #2 Left,Medial Toe Fourth o ABD and Kerlix/Conform Dressing Change Frequency Wound #1 Left,Lateral Foot o Change dressing every day. Wound #2 Left,Medial Toe Fourth o Change dressing every day. Follow-up Appointments Wound #1 Left,Lateral Foot o Return Appointment in 1 week. Wound #2 Left,Medial Toe Fourth o Return Appointment in 1 week. Edema Control AEON, TOFTE ([id number]) Wound #1 Left,Lateral Foot o Patient to wear own compression stockings o Elevate legs to the level of the  heart and pump ankles as often as possible Wound #2 Left,Medial Toe Fourth o Patient to wear own compression stockings o Elevate legs to the level of the heart and pump ankles as often as possible Off-Loading Wound #1 Left,Lateral Foot o Other: - keep pressure off of wound area Wound #2 Left,Medial Toe Fourth o Other: - keep pressure off of wound area Additional Orders / Instructions Wound #1 Left,Lateral Foot o Activity as tolerated Wound #2 Left,Medial Toe Fourth o Activity as tolerated Medications-please add to medication list. Wound #1 Left,Lateral Foot o Santyl Enzymatic Ointment Consults o Podiatry - Surgical Consult-Dr. Elvina Perez Electronic Signature(s) Signed: 05/25/2019 4:15:01 PM By: Nina Ham MD Signed: 05/25/2019 4:52:26 PM By: Nina Perez, BSN, Perez, CWS, Nina Perez, BSN Entered By: Nina Perez, BSN, Perez, CWS, Nina Perez on 05/25/2019 10:57:31 Nina Perez ([id number]) -------------------------------------------------------------------------------- Problem List Details Patient Name: Nina Perez Date of Service: 05/25/2019 10:30 AM Medical Record Number: [id number] Patient Account Number: [id number] Date of Birth/Sex: 04/09/1941 (79 y.o. F) Treating Perez: Nina Perez Primary Care Provider: Einar Perez Other Clinician: Referring Provider: Einar Perez Treating Provider/Extender: Nina Perez in Treatment: 12 Active Problems ICD-10 Evaluated Encounter Code Description Active Date  LAVILA, MOSKAL ([id number]) Visit Report for 05/25/2019 Debridement Details Patient Name: OSIRIS, RENTERIA Date of Service: 05/25/2019 10:30 AM Medical Record Number: [id number] Patient Account Number: [id number] Date of Birth/Sex: [birth year] (79 y.o. F) Treating Perez: Nina Perez Primary Care Provider: Einar Perez Other Clinician: Referring Provider: Einar Perez Treating Provider/Extender: Nina Perez in Treatment: 12 Debridement Performed for Wound #1 Left,Lateral Foot Assessment: Performed By: Physician Ricard Dillon, MD Debridement Type: Debridement Severity of Tissue Pre Fat layer exposed Debridement: Level of Consciousness (Pre- Awake and Alert procedure): Pre-procedure Verification/Time Yes - 10:54 Out Taken: Start Time: 10:54 Pain Control: Lidocaine Total Area Debrided (L x W): 1.4 (cm) x 1 (cm) = 1.4 (cm) Tissue and other material Viable, Non-Viable, Slough, Subcutaneous, Slough debrided: Level: Skin/Subcutaneous Tissue Debridement Description: Excisional Instrument: Curette Bleeding: Minimum Hemostasis Achieved: Pressure End Time: 10:55 Response to Treatment: Procedure was tolerated well Level of Consciousness Awake and Alert (Post-procedure): Post Debridement Measurements of Total Wound Length: (cm) 1.4 Width: (cm) 1 Depth: (cm) 0.3 Volume: (cm) 0.33 Character of Wound/Ulcer Post Debridement: Stable Severity of Tissue Post Debridement: Fat layer exposed Post Procedure Diagnosis Same as Pre-procedure Electronic Signature(s) Signed: 05/25/2019 4:15:01 PM By: Nina Ham MD Signed: 05/25/2019 4:52:26 PM By: Nina Perez, BSN, Perez, CWS, Nina Perez, BSN Entered By: Nina Perez on 05/25/2019 11:36:58 Nina Perez ([id number]) -------------------------------------------------------------------------------- HPI Details Patient Name: Nina Perez Date of Service: 05/25/2019 10:30 AM Medical Record Number: [id number] Patient Account Number:  [id number] Date of Birth/Sex: [birth year] (79 y.o. F) Treating Perez: Nina Perez Primary Care Provider: Einar Perez Other Clinician: Referring Provider: Einar Perez Treating Provider/Extender: Nina Perez in Treatment: 12 History of Present Illness HPI Description: ADMISSION 03/02/2019 Mrs. Licata is a 79 year old woman with a complicated past medical history who presents for review of wound on the left lateral foot for the last 6 Perez. She states she simply looked down and saw a black spot on the left lateral foot and thought this was dirt went to wipe it off and there was an open wound. This is not gotten any better and over the timeframe. She has been using topical triple antibiotic ointment. She has a history of chronic venous insufficiency and has had wounds on her lower legs related to this but never on her feet. She is known to Dr. Aleda Grana at [facility] in [place] and she went to see him about this. He did not order any specific tests and said that he was not exactly sure what it because this per the patient's description. She was put in an [employer] but it looked as though this was stopped on 10/8. She had a week's worth of doxycycline as well. Patient states the area is very painful rating it at a 10 out of 10. She does not really describe claudication element. 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 X-ray we ordered last week was negative for any bony abnormality The patient had venous studies on 06/16/2018 in the [facility] system. There was no thrombosis and they did not comment on reflux. She has had previous vein surgery ABIs in this clinic were 0.96 on the right and 0.88 on the left 10/28; the patient arrives with a small punched out area on the left lateral foot.  This appears somewhat better than last week but still with a nonviable  Dr. Gwenlyn Found this 12/9; punched out area on the left lateral foot. The surface of this looks better. We have been using Santyl she has been changing the dressing. She complains of a lot of pain in the foot specifically the tips of her first and fourth toes. She shows me a small little splinter hemorrhages that look like telangiectasias on the tip of some of her fingers. No doubt this is related to  her scleroderma. As I noted on my note from last week;. Dr. Gwenlyn Found was not convinced the SFA stenosis was related to the wound on her foot. He wanted wound care to see if this would heal on its own without arterial intervention. 12/16; punched out area on the left lateral foot. Still requiring debridement. We have been using Santyl. She is increasing her complaints of pain in the left first and especially the left fourth toe. I also spoke to Dr. Jefm Bryant today. He was okay with holding the methotrexate although he made the point that methotrexate is rarely complicating wound healing at these doses. He also wondered about pyoderma and the possibility of microcalcifications from her crest syndrome. We did an x-ray of her foot there were not any comments about microcalcification. From my point of view this certainly does not look like pyoderma. The first and fourth toes look like ischemic injury. This may be microvascular but I am going to ask Dr. Gwenlyn Found to proceed with revascularization at this point I do not know that there is any other choice here. I will review the angiogram 12/23; after the patient's appointment last week I have been in contact with Dr. Gwenlyn Found. She is now booked for an angiogram on 1/4. She is having increase ischemic breakdown of the left fourth toe and a lot of pain. She has had tramadol ordered by her primary physician Dr. Nicki Reaper. No improvement in the area on her left dorsal foot which was the initial wound 05/18/2019; the patient was admitted to hospital from 1/4 through 1/5 by Dr. Gwenlyn Found. She had angiogram of the left leg. She underwent angioplasty of the mid left SFA lesion. Angiographically this appeared to be more a 60%. Dr. Gwenlyn Found commented to me that he was not certain this would help her lower foot. It was also found that she had no entrance to the AT or PT. Therefore access to the tibial vessels was not possible. At some point it was felt that if she does not improve she may  need dorsalis pedis access with potential recanalization retrograde of her anterior tibial artery. She was just discharged from the hospital yesterday. She is still having a lot of pain in the left fourth toe. She also quite clearly complains of rest claudication at night 1/13; patient missed her appointment with Dr. Gwenlyn Found yesterday. 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 Still complains of significant lower extremity pain which I am not convinced is all the fourth toe. The original wound she had looks stable but is not decreased in size. I note in epic she has an appointment with Dr. Gwenlyn Found on 1/18 Electronic Signature(s) Signed: 05/25/2019 4:15:01 PM By: Nina Ham MD Entered By: Nina Perez on 05/25/2019 11:37:56 Nina Perez ([id number]) -------------------------------------------------------------------------------- Physical Exam Details Patient Name: Nina Perez Date of Service: 05/25/2019 10:30 AM Medical Record Number: [id number] Patient Account Number: [id number] Date of Birth/Sex: [birth year] (79 y.o. F) Treating Perez: Nina Perez Primary Care Provider: Einar Perez Other Clinician: Referring Provider: Einar Perez Treating Provider/Extender: Dellia Nims  Dr. Gwenlyn Found this 12/9; punched out area on the left lateral foot. The surface of this looks better. We have been using Santyl she has been changing the dressing. She complains of a lot of pain in the foot specifically the tips of her first and fourth toes. She shows me a small little splinter hemorrhages that look like telangiectasias on the tip of some of her fingers. No doubt this is related to  her scleroderma. As I noted on my note from last week;. Dr. Gwenlyn Found was not convinced the SFA stenosis was related to the wound on her foot. He wanted wound care to see if this would heal on its own without arterial intervention. 12/16; punched out area on the left lateral foot. Still requiring debridement. We have been using Santyl. She is increasing her complaints of pain in the left first and especially the left fourth toe. I also spoke to Dr. Jefm Bryant today. He was okay with holding the methotrexate although he made the point that methotrexate is rarely complicating wound healing at these doses. He also wondered about pyoderma and the possibility of microcalcifications from her crest syndrome. We did an x-ray of her foot there were not any comments about microcalcification. From my point of view this certainly does not look like pyoderma. The first and fourth toes look like ischemic injury. This may be microvascular but I am going to ask Dr. Gwenlyn Found to proceed with revascularization at this point I do not know that there is any other choice here. I will review the angiogram 12/23; after the patient's appointment last week I have been in contact with Dr. Gwenlyn Found. She is now booked for an angiogram on 1/4. She is having increase ischemic breakdown of the left fourth toe and a lot of pain. She has had tramadol ordered by her primary physician Dr. Nicki Reaper. No improvement in the area on her left dorsal foot which was the initial wound 05/18/2019; the patient was admitted to hospital from 1/4 through 1/5 by Dr. Gwenlyn Found. She had angiogram of the left leg. She underwent angioplasty of the mid left SFA lesion. Angiographically this appeared to be more a 60%. Dr. Gwenlyn Found commented to me that he was not certain this would help her lower foot. It was also found that she had no entrance to the AT or PT. Therefore access to the tibial vessels was not possible. At some point it was felt that if she does not improve she may  need dorsalis pedis access with potential recanalization retrograde of her anterior tibial artery. She was just discharged from the hospital yesterday. She is still having a lot of pain in the left fourth toe. She also quite clearly complains of rest claudication at night 1/13; patient missed her appointment with Dr. Gwenlyn Found yesterday. 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 Still complains of significant lower extremity pain which I am not convinced is all the fourth toe. The original wound she had looks stable but is not decreased in size. I note in epic she has an appointment with Dr. Gwenlyn Found on 1/18 Electronic Signature(s) Signed: 05/25/2019 4:15:01 PM By: Nina Ham MD Entered By: Nina Perez on 05/25/2019 11:37:56 Nina Perez ([id number]) -------------------------------------------------------------------------------- Physical Exam Details Patient Name: Nina Perez Date of Service: 05/25/2019 10:30 AM Medical Record Number: [id number] Patient Account Number: [id number] Date of Birth/Sex: [birth year] (79 y.o. F) Treating Perez: Nina Perez Primary Care Provider: Einar Perez Other Clinician: Referring Provider: Einar Perez Treating Provider/Extender: Dellia Nims  LAVILA, MOSKAL ([id number]) Visit Report for 05/25/2019 Debridement Details Patient Name: OSIRIS, RENTERIA Date of Service: 05/25/2019 10:30 AM Medical Record Number: [id number] Patient Account Number: [id number] Date of Birth/Sex: [birth year] (79 y.o. F) Treating Perez: Nina Perez Primary Care Provider: Einar Perez Other Clinician: Referring Provider: Einar Perez Treating Provider/Extender: Nina Perez in Treatment: 12 Debridement Performed for Wound #1 Left,Lateral Foot Assessment: Performed By: Physician Ricard Dillon, MD Debridement Type: Debridement Severity of Tissue Pre Fat layer exposed Debridement: Level of Consciousness (Pre- Awake and Alert procedure): Pre-procedure Verification/Time Yes - 10:54 Out Taken: Start Time: 10:54 Pain Control: Lidocaine Total Area Debrided (L x W): 1.4 (cm) x 1 (cm) = 1.4 (cm) Tissue and other material Viable, Non-Viable, Slough, Subcutaneous, Slough debrided: Level: Skin/Subcutaneous Tissue Debridement Description: Excisional Instrument: Curette Bleeding: Minimum Hemostasis Achieved: Pressure End Time: 10:55 Response to Treatment: Procedure was tolerated well Level of Consciousness Awake and Alert (Post-procedure): Post Debridement Measurements of Total Wound Length: (cm) 1.4 Width: (cm) 1 Depth: (cm) 0.3 Volume: (cm) 0.33 Character of Wound/Ulcer Post Debridement: Stable Severity of Tissue Post Debridement: Fat layer exposed Post Procedure Diagnosis Same as Pre-procedure Electronic Signature(s) Signed: 05/25/2019 4:15:01 PM By: Nina Ham MD Signed: 05/25/2019 4:52:26 PM By: Nina Perez, BSN, Perez, CWS, Nina Perez, BSN Entered By: Nina Perez on 05/25/2019 11:36:58 Nina Perez ([id number]) -------------------------------------------------------------------------------- HPI Details Patient Name: Nina Perez Date of Service: 05/25/2019 10:30 AM Medical Record Number: [id number] Patient Account Number:  [id number] Date of Birth/Sex: [birth year] (79 y.o. F) Treating Perez: Nina Perez Primary Care Provider: Einar Perez Other Clinician: Referring Provider: Einar Perez Treating Provider/Extender: Nina Perez in Treatment: 12 History of Present Illness HPI Description: ADMISSION 03/02/2019 Mrs. Licata is a 79 year old woman with a complicated past medical history who presents for review of wound on the left lateral foot for the last 6 Perez. She states she simply looked down and saw a black spot on the left lateral foot and thought this was dirt went to wipe it off and there was an open wound. This is not gotten any better and over the timeframe. She has been using topical triple antibiotic ointment. She has a history of chronic venous insufficiency and has had wounds on her lower legs related to this but never on her feet. She is known to Dr. Aleda Grana at [facility] in [place] and she went to see him about this. He did not order any specific tests and said that he was not exactly sure what it because this per the patient's description. She was put in an [employer] but it looked as though this was stopped on 10/8. She had a week's worth of doxycycline as well. Patient states the area is very painful rating it at a 10 out of 10. She does not really describe claudication element. 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 X-ray we ordered last week was negative for any bony abnormality The patient had venous studies on 06/16/2018 in the [facility] system. There was no thrombosis and they did not comment on reflux. She has had previous vein surgery ABIs in this clinic were 0.96 on the right and 0.88 on the left 10/28; the patient arrives with a small punched out area on the left lateral foot.  This appears somewhat better than last week but still with a nonviable  tolerated Medications-please add to medication list.: Wound #1 Left,Lateral Foot: Santyl Enzymatic Ointment Consults ordered were: Podiatry - Surgical Consult-Dr. Neysa Hotter, Ryelle T. ([id number]) 1. Continue with Santyl ointment to the left lateral foot 2. Betadine to the left fourth toe 3. Another reach out to Dr. Gwenlyn Found. 4. We also arranged for a podiatry consult to consider amputation of the fourth toe if her dorsalis pedis pulses indicative of adequacy of healing proximally then I am somewhat optimistic. Electronic Signature(s) Signed: 05/25/2019 4:15:01 PM By: Nina Ham MD Entered By: Nina Perez on 05/25/2019 11:41:52 Nina Perez ([id number]) -------------------------------------------------------------------------------- [place] Details Patient Name: Nina Perez Date of Service: 05/25/2019 Medical Record Number: [id number] Patient Account Number: [id number] Date of Birth/Sex: 1941-01-02 (79 y.o. F) Treating Perez: Nina Perez Primary Care Provider: Einar Perez Other Clinician: Referring Provider: Einar Perez Treating Provider/Extender: Nina Perez in Treatment: 12 Diagnosis Coding ICD-10 Codes Code Description E11.52 Type 2 diabetes mellitus with diabetic peripheral angiopathy with gangrene E11.621 Type 2 diabetes mellitus with foot ulcer I87.323 Chronic venous hypertension (idiopathic) with inflammation of bilateral lower extremity L97.528  Non-pressure chronic ulcer of other part of left foot with other specified severity M05.7A Rheumatoid arthritis with rheumatoid factor of other specified site without organ or systems involvement Facility Procedures CPT4 Code Description: [id number] - DEB SUBQ TISSUE 20 SQ CM/< ICD-10 Diagnosis Description L97.528 Non-pressure chronic ulcer of other part of left foot with other Modifier: specified sever Quantity: 1 ity Physician Procedures CPT4 Code Description: [id number] - WC PHYS SUBQ TISS 20 SQ CM ICD-10 Diagnosis Description L97.528 Non-pressure chronic ulcer of other part of left foot with other Modifier: specified severi Quantity: 1 ty Electronic Signature(s) Signed: 05/25/2019 4:15:01 PM By: Nina Ham MD Entered By: Nina Perez on 05/25/2019 11:42:15  LAVILA, MOSKAL ([id number]) Visit Report for 05/25/2019 Debridement Details Patient Name: OSIRIS, RENTERIA Date of Service: 05/25/2019 10:30 AM Medical Record Number: [id number] Patient Account Number: [id number] Date of Birth/Sex: [birth year] (79 y.o. F) Treating Perez: Nina Perez Primary Care Provider: Einar Perez Other Clinician: Referring Provider: Einar Perez Treating Provider/Extender: Nina Perez in Treatment: 12 Debridement Performed for Wound #1 Left,Lateral Foot Assessment: Performed By: Physician Ricard Dillon, MD Debridement Type: Debridement Severity of Tissue Pre Fat layer exposed Debridement: Level of Consciousness (Pre- Awake and Alert procedure): Pre-procedure Verification/Time Yes - 10:54 Out Taken: Start Time: 10:54 Pain Control: Lidocaine Total Area Debrided (L x W): 1.4 (cm) x 1 (cm) = 1.4 (cm) Tissue and other material Viable, Non-Viable, Slough, Subcutaneous, Slough debrided: Level: Skin/Subcutaneous Tissue Debridement Description: Excisional Instrument: Curette Bleeding: Minimum Hemostasis Achieved: Pressure End Time: 10:55 Response to Treatment: Procedure was tolerated well Level of Consciousness Awake and Alert (Post-procedure): Post Debridement Measurements of Total Wound Length: (cm) 1.4 Width: (cm) 1 Depth: (cm) 0.3 Volume: (cm) 0.33 Character of Wound/Ulcer Post Debridement: Stable Severity of Tissue Post Debridement: Fat layer exposed Post Procedure Diagnosis Same as Pre-procedure Electronic Signature(s) Signed: 05/25/2019 4:15:01 PM By: Nina Ham MD Signed: 05/25/2019 4:52:26 PM By: Nina Perez, BSN, Perez, CWS, Nina Perez, BSN Entered By: Nina Perez on 05/25/2019 11:36:58 Nina Perez ([id number]) -------------------------------------------------------------------------------- HPI Details Patient Name: Nina Perez Date of Service: 05/25/2019 10:30 AM Medical Record Number: [id number] Patient Account Number:  [id number] Date of Birth/Sex: [birth year] (79 y.o. F) Treating Perez: Nina Perez Primary Care Provider: Einar Perez Other Clinician: Referring Provider: Einar Perez Treating Provider/Extender: Nina Perez in Treatment: 12 History of Present Illness HPI Description: ADMISSION 03/02/2019 Mrs. Licata is a 79 year old woman with a complicated past medical history who presents for review of wound on the left lateral foot for the last 6 Perez. She states she simply looked down and saw a black spot on the left lateral foot and thought this was dirt went to wipe it off and there was an open wound. This is not gotten any better and over the timeframe. She has been using topical triple antibiotic ointment. She has a history of chronic venous insufficiency and has had wounds on her lower legs related to this but never on her feet. She is known to Dr. Aleda Grana at [facility] in [place] and she went to see him about this. He did not order any specific tests and said that he was not exactly sure what it because this per the patient's description. She was put in an [employer] but it looked as though this was stopped on 10/8. She had a week's worth of doxycycline as well. Patient states the area is very painful rating it at a 10 out of 10. She does not really describe claudication element. 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 X-ray we ordered last week was negative for any bony abnormality The patient had venous studies on 06/16/2018 in the [facility] system. There was no thrombosis and they did not comment on reflux. She has had previous vein surgery ABIs in this clinic were 0.96 on the right and 0.88 on the left 10/28; the patient arrives with a small punched out area on the left lateral foot.  This appears somewhat better than last week but still with a nonviable  tolerated Medications-please add to medication list.: Wound #1 Left,Lateral Foot: Santyl Enzymatic Ointment Consults ordered were: Podiatry - Surgical Consult-Dr. Neysa Hotter, Ryelle T. ([id number]) 1. Continue with Santyl ointment to the left lateral foot 2. Betadine to the left fourth toe 3. Another reach out to Dr. Gwenlyn Found. 4. We also arranged for a podiatry consult to consider amputation of the fourth toe if her dorsalis pedis pulses indicative of adequacy of healing proximally then I am somewhat optimistic. Electronic Signature(s) Signed: 05/25/2019 4:15:01 PM By: Nina Ham MD Entered By: Nina Perez on 05/25/2019 11:41:52 Nina Perez ([id number]) -------------------------------------------------------------------------------- [place] Details Patient Name: Nina Perez Date of Service: 05/25/2019 Medical Record Number: [id number] Patient Account Number: [id number] Date of Birth/Sex: 1941-01-02 (79 y.o. F) Treating Perez: Nina Perez Primary Care Provider: Einar Perez Other Clinician: Referring Provider: Einar Perez Treating Provider/Extender: Nina Perez in Treatment: 12 Diagnosis Coding ICD-10 Codes Code Description E11.52 Type 2 diabetes mellitus with diabetic peripheral angiopathy with gangrene E11.621 Type 2 diabetes mellitus with foot ulcer I87.323 Chronic venous hypertension (idiopathic) with inflammation of bilateral lower extremity L97.528  Non-pressure chronic ulcer of other part of left foot with other specified severity M05.7A Rheumatoid arthritis with rheumatoid factor of other specified site without organ or systems involvement Facility Procedures CPT4 Code Description: [id number] - DEB SUBQ TISSUE 20 SQ CM/< ICD-10 Diagnosis Description L97.528 Non-pressure chronic ulcer of other part of left foot with other Modifier: specified sever Quantity: 1 ity Physician Procedures CPT4 Code Description: [id number] - WC PHYS SUBQ TISS 20 SQ CM ICD-10 Diagnosis Description L97.528 Non-pressure chronic ulcer of other part of left foot with other Modifier: specified severi Quantity: 1 ty Electronic Signature(s) Signed: 05/25/2019 4:15:01 PM By: Nina Ham MD Entered By: Nina Perez on 05/25/2019 11:42:15

## 2019-05-29 ENCOUNTER — Encounter: Payer: Self-pay | Admitting: Internal Medicine

## 2019-05-29 ENCOUNTER — Telehealth: Payer: Self-pay | Admitting: Internal Medicine

## 2019-05-29 DIAGNOSIS — I739 Peripheral vascular disease, unspecified: Secondary | ICD-10-CM | POA: Insufficient documentation

## 2019-05-29 DIAGNOSIS — L97529 Non-pressure chronic ulcer of other part of left foot with unspecified severity: Secondary | ICD-10-CM | POA: Insufficient documentation

## 2019-05-29 DIAGNOSIS — I70229 Atherosclerosis of native arteries of extremities with rest pain, unspecified extremity: Secondary | ICD-10-CM | POA: Insufficient documentation

## 2019-05-29 NOTE — Assessment & Plan Note (Signed)
Platelet count 05/17/19 - decreased.  Recheck cbc.

## 2019-05-29 NOTE — Assessment & Plan Note (Signed)
Being followed by wound clinic.  Started to improve per pt.  Continue f/u at wound clinic.

## 2019-05-29 NOTE — Assessment & Plan Note (Signed)
Was recently given rx for statin medication.  Had questions about starting.  Discussed importance of starting cholesterol medication.  Have discussed calculated cholesterol risk.  She will start medication. Follow lipid panel and liver function tests.

## 2019-05-29 NOTE — Assessment & Plan Note (Signed)
Blood pressure has been under reasonable control.  Continue current medication regimen.  Follow pressures.  Follow metabolic panel.  

## 2019-05-29 NOTE — Assessment & Plan Note (Signed)
On MTX.  Followed by Dr Jefm Bryant.

## 2019-05-29 NOTE — Assessment & Plan Note (Signed)
No upper symptoms reported.   

## 2019-05-29 NOTE — Telephone Encounter (Signed)
Schedule fasting labs within the next week.

## 2019-05-29 NOTE — Assessment & Plan Note (Signed)
Had recent acute discoloration of toe as outlined.  Saw Dr Gwenlyn Found.  Is s/p angioplasty as outlined.  Still with increased pain.  Taking tramadol.  Describes burning pain.  Will add gabapentin 100mg  tid.  Follow.  Pt request referral to podiatry.  She also request to get established with cardiology here in town.  Request to see Dr Ubaldo Glassing.

## 2019-05-29 NOTE — Assessment & Plan Note (Signed)
Low carb diet and exercise.  Follow met b and a1c.   

## 2019-05-30 ENCOUNTER — Other Ambulatory Visit: Payer: Self-pay

## 2019-05-30 ENCOUNTER — Ambulatory Visit (HOSPITAL_COMMUNITY)
Admission: RE | Admit: 2019-05-30 | Discharge: 2019-05-30 | Disposition: A | Discharge status: Home / Self Care | Payer: Medicare Other | Source: Ambulatory Visit | Attending: Cardiovascular Disease | Admitting: Cardiovascular Disease

## 2019-05-30 DIAGNOSIS — I739 Peripheral vascular disease, unspecified: Secondary | ICD-10-CM | POA: Insufficient documentation

## 2019-05-30 NOTE — Telephone Encounter (Signed)
Called and scheduled pt

## 2019-05-31 ENCOUNTER — Ambulatory Visit: Payer: Medicare Other | Admitting: Cardiovascular Disease

## 2019-05-31 DIAGNOSIS — I739 Peripheral vascular disease, unspecified: Secondary | ICD-10-CM

## 2019-06-01 ENCOUNTER — Encounter: Payer: Self-pay | Admitting: Internal Medicine

## 2019-06-01 ENCOUNTER — Inpatient Hospital Stay
Admission: AD | Admit: 2019-06-01 | Discharge: 2019-06-09 | DRG: 253 | Disposition: A | Discharge status: Home Health | Payer: Medicare Other | Attending: Internal Medicine | Admitting: Internal Medicine

## 2019-06-01 ENCOUNTER — Ambulatory Visit: Payer: Medicare Other | Admitting: Internal Medicine

## 2019-06-01 ENCOUNTER — Other Ambulatory Visit: Payer: Self-pay

## 2019-06-01 DIAGNOSIS — I10 Essential (primary) hypertension: Secondary | ICD-10-CM | POA: Diagnosis present

## 2019-06-01 DIAGNOSIS — B965 Pseudomonas (aeruginosa) (mallei) (pseudomallei) as the cause of diseases classified elsewhere: Secondary | ICD-10-CM | POA: Diagnosis not present

## 2019-06-01 DIAGNOSIS — E1142 Type 2 diabetes mellitus with diabetic polyneuropathy: Secondary | ICD-10-CM | POA: Diagnosis present

## 2019-06-01 DIAGNOSIS — Z9981 Dependence on supplemental oxygen: Secondary | ICD-10-CM | POA: Diagnosis not present

## 2019-06-01 DIAGNOSIS — I872 Venous insufficiency (chronic) (peripheral): Secondary | ICD-10-CM | POA: Diagnosis present

## 2019-06-01 DIAGNOSIS — L03116 Cellulitis of left lower limb: Secondary | ICD-10-CM | POA: Diagnosis not present

## 2019-06-01 DIAGNOSIS — M059 Rheumatoid arthritis with rheumatoid factor, unspecified: Secondary | ICD-10-CM | POA: Diagnosis present

## 2019-06-01 DIAGNOSIS — K2270 Barrett's esophagus without dysplasia: Secondary | ICD-10-CM | POA: Diagnosis not present

## 2019-06-01 DIAGNOSIS — M0579 Rheumatoid arthritis with rheumatoid factor of multiple sites without organ or systems involvement: Secondary | ICD-10-CM | POA: Diagnosis present

## 2019-06-01 DIAGNOSIS — T8141XA Infection following a procedure, superficial incisional surgical site, initial encounter: Secondary | ICD-10-CM | POA: Diagnosis not present

## 2019-06-01 DIAGNOSIS — M170 Bilateral primary osteoarthritis of knee: Secondary | ICD-10-CM | POA: Diagnosis present

## 2019-06-01 DIAGNOSIS — Z8261 Family history of arthritis: Secondary | ICD-10-CM

## 2019-06-01 DIAGNOSIS — M109 Gout, unspecified: Secondary | ICD-10-CM | POA: Diagnosis present

## 2019-06-01 DIAGNOSIS — R509 Fever, unspecified: Secondary | ICD-10-CM

## 2019-06-01 DIAGNOSIS — Z888 Allergy status to other drugs, medicaments and biological substances status: Secondary | ICD-10-CM

## 2019-06-01 DIAGNOSIS — E1122 Type 2 diabetes mellitus with diabetic chronic kidney disease: Secondary | ICD-10-CM | POA: Diagnosis present

## 2019-06-01 DIAGNOSIS — Z6836 Body mass index (BMI) 36.0-36.9, adult: Secondary | ICD-10-CM

## 2019-06-01 DIAGNOSIS — E1152 Type 2 diabetes mellitus with diabetic peripheral angiopathy with gangrene: Secondary | ICD-10-CM | POA: Diagnosis present

## 2019-06-01 DIAGNOSIS — E1169 Type 2 diabetes mellitus with other specified complication: Secondary | ICD-10-CM | POA: Diagnosis present

## 2019-06-01 DIAGNOSIS — E1159 Type 2 diabetes mellitus with other circulatory complications: Secondary | ICD-10-CM | POA: Diagnosis not present

## 2019-06-01 DIAGNOSIS — I7301 Raynaud's syndrome with gangrene: Secondary | ICD-10-CM | POA: Diagnosis not present

## 2019-06-01 DIAGNOSIS — Z85828 Personal history of other malignant neoplasm of skin: Secondary | ICD-10-CM

## 2019-06-01 DIAGNOSIS — Y92230 Patient room in hospital as the place of occurrence of the external cause: Secondary | ICD-10-CM | POA: Diagnosis not present

## 2019-06-01 DIAGNOSIS — L02612 Cutaneous abscess of left foot: Secondary | ICD-10-CM | POA: Diagnosis not present

## 2019-06-01 DIAGNOSIS — M479 Spondylosis, unspecified: Secondary | ICD-10-CM | POA: Diagnosis present

## 2019-06-01 DIAGNOSIS — I272 Pulmonary hypertension, unspecified: Secondary | ICD-10-CM | POA: Diagnosis present

## 2019-06-01 DIAGNOSIS — Z20822 Contact with and (suspected) exposure to covid-19: Secondary | ICD-10-CM | POA: Diagnosis present

## 2019-06-01 DIAGNOSIS — Z7984 Long term (current) use of oral hypoglycemic drugs: Secondary | ICD-10-CM

## 2019-06-01 DIAGNOSIS — M069 Rheumatoid arthritis, unspecified: Secondary | ICD-10-CM | POA: Diagnosis present

## 2019-06-01 DIAGNOSIS — E785 Hyperlipidemia, unspecified: Secondary | ICD-10-CM | POA: Diagnosis present

## 2019-06-01 DIAGNOSIS — I70244 Atherosclerosis of native arteries of left leg with ulceration of heel and midfoot: Secondary | ICD-10-CM | POA: Diagnosis not present

## 2019-06-01 DIAGNOSIS — Z79891 Long term (current) use of opiate analgesic: Secondary | ICD-10-CM

## 2019-06-01 DIAGNOSIS — I129 Hypertensive chronic kidney disease with stage 1 through stage 4 chronic kidney disease, or unspecified chronic kidney disease: Secondary | ICD-10-CM | POA: Diagnosis present

## 2019-06-01 DIAGNOSIS — Z79899 Other long term (current) drug therapy: Secondary | ICD-10-CM

## 2019-06-01 DIAGNOSIS — Z8672 Personal history of thrombophlebitis: Secondary | ICD-10-CM

## 2019-06-01 DIAGNOSIS — N1831 Chronic kidney disease, stage 3a: Secondary | ICD-10-CM | POA: Diagnosis present

## 2019-06-01 DIAGNOSIS — I70229 Atherosclerosis of native arteries of extremities with rest pain, unspecified extremity: Secondary | ICD-10-CM | POA: Diagnosis present

## 2019-06-01 DIAGNOSIS — B9689 Other specified bacterial agents as the cause of diseases classified elsewhere: Secondary | ICD-10-CM | POA: Diagnosis not present

## 2019-06-01 DIAGNOSIS — D696 Thrombocytopenia, unspecified: Secondary | ICD-10-CM | POA: Diagnosis not present

## 2019-06-01 DIAGNOSIS — I70262 Atherosclerosis of native arteries of extremities with gangrene, left leg: Secondary | ICD-10-CM | POA: Diagnosis not present

## 2019-06-01 DIAGNOSIS — E7800 Pure hypercholesterolemia, unspecified: Secondary | ICD-10-CM | POA: Diagnosis present

## 2019-06-01 DIAGNOSIS — K219 Gastro-esophageal reflux disease without esophagitis: Secondary | ICD-10-CM | POA: Diagnosis present

## 2019-06-01 DIAGNOSIS — Z7902 Long term (current) use of antithrombotics/antiplatelets: Secondary | ICD-10-CM

## 2019-06-01 DIAGNOSIS — Y835 Amputation of limb(s) as the cause of abnormal reaction of the patient, or of later complication, without mention of misadventure at the time of the procedure: Secondary | ICD-10-CM | POA: Diagnosis not present

## 2019-06-01 DIAGNOSIS — Z7982 Long term (current) use of aspirin: Secondary | ICD-10-CM

## 2019-06-01 DIAGNOSIS — L7632 Postprocedural hematoma of skin and subcutaneous tissue following other procedure: Secondary | ICD-10-CM | POA: Diagnosis not present

## 2019-06-01 DIAGNOSIS — N183 Chronic kidney disease, stage 3 unspecified: Secondary | ICD-10-CM | POA: Diagnosis present

## 2019-06-01 DIAGNOSIS — E119 Type 2 diabetes mellitus without complications: Secondary | ICD-10-CM

## 2019-06-01 DIAGNOSIS — M341 CR(E)ST syndrome: Secondary | ICD-10-CM | POA: Diagnosis present

## 2019-06-01 DIAGNOSIS — Z87891 Personal history of nicotine dependence: Secondary | ICD-10-CM

## 2019-06-01 DIAGNOSIS — I96 Gangrene, not elsewhere classified: Secondary | ICD-10-CM | POA: Diagnosis not present

## 2019-06-01 DIAGNOSIS — E11628 Type 2 diabetes mellitus with other skin complications: Secondary | ICD-10-CM | POA: Diagnosis not present

## 2019-06-01 DIAGNOSIS — I739 Peripheral vascular disease, unspecified: Secondary | ICD-10-CM | POA: Diagnosis present

## 2019-06-01 DIAGNOSIS — J8410 Pulmonary fibrosis, unspecified: Secondary | ICD-10-CM | POA: Diagnosis present

## 2019-06-01 DIAGNOSIS — L97529 Non-pressure chronic ulcer of other part of left foot with unspecified severity: Secondary | ICD-10-CM | POA: Diagnosis not present

## 2019-06-01 DIAGNOSIS — E11621 Type 2 diabetes mellitus with foot ulcer: Secondary | ICD-10-CM | POA: Diagnosis not present

## 2019-06-01 DIAGNOSIS — Z89422 Acquired absence of other left toe(s): Secondary | ICD-10-CM | POA: Diagnosis not present

## 2019-06-01 DIAGNOSIS — Z85038 Personal history of other malignant neoplasm of large intestine: Secondary | ICD-10-CM

## 2019-06-01 DIAGNOSIS — K2100 Gastro-esophageal reflux disease with esophagitis, without bleeding: Secondary | ICD-10-CM | POA: Diagnosis not present

## 2019-06-01 DIAGNOSIS — L089 Local infection of the skin and subcutaneous tissue, unspecified: Secondary | ICD-10-CM | POA: Diagnosis not present

## 2019-06-01 DIAGNOSIS — Z8249 Family history of ischemic heart disease and other diseases of the circulatory system: Secondary | ICD-10-CM

## 2019-06-01 LAB — CREATININE, SERUM
Creatinine, Ser: 1.24 mg/dL — ABNORMAL HIGH (ref 0.44–1.00)
GFR calc Af Amer: 48 mL/min — ABNORMAL LOW (ref >60)
GFR calc non Af Amer: 42 mL/min — ABNORMAL LOW (ref >60)

## 2019-06-01 LAB — TSH: TSH: 3.558 u[IU]/mL (ref 0.350–4.500)

## 2019-06-01 LAB — HEMOGLOBIN A1C
Hgb A1c MFr Bld: 5.7 % — ABNORMAL HIGH (ref 4.8–5.6)
Mean Plasma Glucose: 116.89 mg/dL

## 2019-06-01 LAB — CBC
HCT: 38.9 % (ref 36.0–46.0)
Hemoglobin: 12.1 g/dL (ref 12.0–15.0)
MCH: 30.8 pg (ref 26.0–34.0)
MCHC: 31.1 g/dL (ref 30.0–36.0)
MCV: 99 fL (ref 80.0–100.0)
Platelets: 153 10*3/uL (ref 150–400)
RBC: 3.93 MIL/uL (ref 3.87–5.11)
RDW: 17.7 % — ABNORMAL HIGH (ref 11.5–15.5)
WBC: 5 10*3/uL (ref 4.0–10.5)
nRBC: 0 % (ref 0.0–0.2)

## 2019-06-01 LAB — GLUCOSE, CAPILLARY
Glucose-Capillary: 85 mg/dL (ref 70–99)
Glucose-Capillary: 96 mg/dL (ref 70–99)

## 2019-06-01 LAB — RESPIRATORY PANEL BY RT PCR (FLU A&B, COVID)
Influenza A by PCR: NEGATIVE
Influenza B by PCR: NEGATIVE
SARS Coronavirus 2 by RT PCR: NEGATIVE

## 2019-06-01 LAB — SURGICAL PCR SCREEN
MRSA, PCR: NEGATIVE
Staphylococcus aureus: NEGATIVE

## 2019-06-01 MED ORDER — ACETAMINOPHEN 325 MG PO TABS
650.0000 mg | ORAL_TABLET | Freq: Three times a day (TID) | ORAL | Status: DC | PRN
  Administered 2019-06-01: 650 mg via ORAL
  Filled 2019-06-01: qty 2

## 2019-06-01 MED ORDER — ALLOPURINOL 300 MG PO TABS
300.0000 mg | ORAL_TABLET | Freq: Every day | ORAL | Status: DC
  Administered 2019-06-03 – 2019-06-09 (×6): 300 mg via ORAL
  Filled 2019-06-01 (×9): qty 1

## 2019-06-01 MED ORDER — PANTOPRAZOLE SODIUM 40 MG PO TBEC
40.0000 mg | DELAYED_RELEASE_TABLET | Freq: Every day | ORAL | Status: DC
  Administered 2019-06-03 – 2019-06-09 (×6): 40 mg via ORAL
  Filled 2019-06-01 (×6): qty 1

## 2019-06-01 MED ORDER — LOSARTAN POTASSIUM 50 MG PO TABS
100.0000 mg | ORAL_TABLET | Freq: Every day | ORAL | Status: DC
  Administered 2019-06-03 – 2019-06-09 (×6): 100 mg via ORAL
  Filled 2019-06-01 (×6): qty 2

## 2019-06-01 MED ORDER — ENOXAPARIN SODIUM 40 MG/0.4ML ~~LOC~~ SOLN
40.0000 mg | SUBCUTANEOUS | Status: DC
  Administered 2019-06-01 – 2019-06-08 (×8): 40 mg via SUBCUTANEOUS
  Filled 2019-06-01 (×8): qty 0.4

## 2019-06-01 MED ORDER — INSULIN ASPART 100 UNIT/ML ~~LOC~~ SOLN: 0.0000 [IU] | SUBCUTANEOUS | Status: DC

## 2019-06-01 MED ORDER — FOLIC ACID 1 MG PO TABS
1.0000 mg | ORAL_TABLET | Freq: Every evening | ORAL | Status: DC
  Administered 2019-06-02 – 2019-06-09 (×8): 1 mg via ORAL
  Filled 2019-06-01 (×8): qty 1

## 2019-06-01 MED ORDER — METHOTREXATE 2.5 MG PO TABS
10.0000 mg | ORAL_TABLET | ORAL | Status: DC
  Administered 2019-06-08: 10 mg via ORAL
  Filled 2019-06-01: qty 4

## 2019-06-01 MED ORDER — ATORVASTATIN CALCIUM 20 MG PO TABS
80.0000 mg | ORAL_TABLET | Freq: Every day | ORAL | Status: DC
  Administered 2019-06-02 – 2019-06-09 (×8): 80 mg via ORAL
  Filled 2019-06-01 (×8): qty 4

## 2019-06-01 MED ORDER — ASPIRIN EC 81 MG PO TBEC
81.0000 mg | DELAYED_RELEASE_TABLET | Freq: Every day | ORAL | Status: DC
  Administered 2019-06-03 – 2019-06-09 (×6): 81 mg via ORAL
  Filled 2019-06-01 (×6): qty 1

## 2019-06-01 MED ORDER — TRAMADOL HCL 50 MG PO TABS
50.0000 mg | ORAL_TABLET | Freq: Two times a day (BID) | ORAL | Status: DC | PRN
  Administered 2019-06-01 – 2019-06-03 (×2): 50 mg via ORAL
  Filled 2019-06-01 (×2): qty 1

## 2019-06-01 MED ORDER — FLUTICASONE PROPIONATE 50 MCG/ACT NA SUSP: 1.0000 | Freq: Every day | NASAL | Status: DC

## 2019-06-01 MED ORDER — SPIRONOLACTONE 25 MG PO TABS
25.0000 mg | ORAL_TABLET | Freq: Every day | ORAL | Status: DC
  Administered 2019-06-03 – 2019-06-09 (×6): 25 mg via ORAL
  Filled 2019-06-01 (×6): qty 1

## 2019-06-01 MED ORDER — LACTATED RINGERS IV SOLN: INTRAVENOUS | Status: DC

## 2019-06-01 MED ORDER — METOPROLOL SUCCINATE ER 50 MG PO TB24
50.0000 mg | ORAL_TABLET | Freq: Every day | ORAL | Status: DC
  Administered 2019-06-03 – 2019-06-09 (×7): 50 mg via ORAL
  Filled 2019-06-01 (×7): qty 1

## 2019-06-01 MED ORDER — AMLODIPINE BESYLATE 10 MG PO TABS
10.0000 mg | ORAL_TABLET | Freq: Every day | ORAL | Status: DC
  Administered 2019-06-03 – 2019-06-09 (×6): 10 mg via ORAL
  Filled 2019-06-01 (×6): qty 1

## 2019-06-01 MED ORDER — CLOPIDOGREL BISULFATE 75 MG PO TABS
75.0000 mg | ORAL_TABLET | Freq: Every day | ORAL | Status: DC
  Administered 2019-06-03 – 2019-06-09 (×6): 75 mg via ORAL
  Filled 2019-06-01 (×6): qty 1

## 2019-06-01 MED ORDER — MUPIROCIN 2 % EX OINT
1.0000 "application " | TOPICAL_OINTMENT | Freq: Two times a day (BID) | CUTANEOUS | Status: AC
  Administered 2019-06-01 – 2019-06-05 (×4): 1 via NASAL
  Filled 2019-06-01: qty 22

## 2019-06-01 MED ORDER — GABAPENTIN 100 MG PO CAPS
100.0000 mg | ORAL_CAPSULE | Freq: Three times a day (TID) | ORAL | Status: DC
  Administered 2019-06-01 – 2019-06-09 (×20): 100 mg via ORAL
  Filled 2019-06-01 (×20): qty 1

## 2019-06-01 NOTE — Plan of Care (Signed)

## 2019-06-01 NOTE — Anesthesia Preprocedure Evaluation (Addendum)
Anesthesia Evaluation  Patient identified by MRN, date of birth, ID band Patient awake    Reviewed: Allergy & Precautions, H&P , NPO status , reviewed documented beta blocker date and time   Airway Mallampati: III  TM Distance: >3 FB Neck ROM: limited    Dental  (+) Edentulous Upper, Chipped, Missing   Pulmonary shortness of breath and Long-Term Oxygen Therapy, former smoker,     + decreased breath sounds      Cardiovascular hypertension, + Peripheral Vascular Disease  Normal cardiovascular exam+ Valvular Problems/Murmurs      Neuro/Psych    GI/Hepatic GERD  Medicated and Controlled,  Endo/Other  diabetesMorbid obesity  Renal/GU      Musculoskeletal  (+) Arthritis ,   Abdominal   Peds  (+) 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   Anesthesia Other Findings Past Medical History: No date: Barrett's esophagus No date: Cancer (HCC)     Comment:  skin ca No date: Diabetes mellitus (HCC) No date: Gout No date: History of colon cancer     Comment:  adenomatous polyps No date: Hypercholesterolemia No date: Hypertension No date: Inflammatory arthritis No date: Osteoarthritis     Comment:  knees, spine No date: Phlebitis     Comment:  x2 (with pregnancy) No date: Pulmonary fibrosis (HCC)     Comment:  mild No date: Pulmonary hypertension ([place]), mild per ECHO No date: Reflux esophagitis No date: Rheumatoid arthritis(714.0)     Comment:  positive RF, FANA, RNP, negative CCP ab and anti DNA,.                neg anti-SCL 70 No date: Scleroderma ([place])     Comment:  raynaud's, sclerodactyly, telangiectasias   Reproductive/Obstetrics                          Anesthesia Physical Anesthesia Plan  ASA: III  Anesthesia Plan: General   Post-op Pain Management:    Induction: Intravenous  PONV Risk Score and Plan: Treatment may vary due to age or medical condition and  TIVA  Airway Management Planned: Nasal Cannula and Natural Airway  Additional Equipment:   Intra-op Plan:   Post-operative Plan:   Informed Consent: I have reviewed the patients History and Physical, chart, labs and discussed the procedure including the risks, benefits and alternatives for the proposed anesthesia with the patient or authorized representative who has indicated his/her understanding and acceptance.     Dental Advisory Given  Plan Discussed with: CRNA  Anesthesia Plan Comments:         Anesthesia Quick Evaluation

## 2019-06-01 NOTE — H&P (Signed)
History and Physical    Nina Perez:[id number] DOB: July 12, 1940 DOA: 06/01/2019   PCP: Nina Barre, MD  Outpatient Specialists: Dr.[facility]. Patient coming from: Home.  Chief Complaint: left 4th MTP gangrene.  HPI: Nina Perez is a 79 y.o. female with medical history significant of DM II, HTN, Pad presents with her left fourth toe turning black 4 weeks ago. PT has been going to the wound center in [place] and after several weeks is here to have toe removed as it cannot be saved per Nina Perez -husband. Pt has balloon angioplasty /angiogram last week in [place] and was not told results. Per chart review:No evidence of significant stenosis of the left lower extremity, s/p left SFA mid angioplasty. Marked improvement is noted compared to previous exam. Yesterday pt went to podiatry appt and was told to come to hospital. Pt is only c/o left toe pain. Pt uses oxygen 2L [place] at home, pt uses it only intermittently only if she is sob and next appt is in few weeks.   ED Course: N/A  Review of Systems: As per HPI otherwise 10 point review of systems negative.   Past Medical History:  Diagnosis Date  . Barrett's esophagus   . Cancer (HCC)    skin ca  . Diabetes mellitus (HCC)   . Gout   . History of colon cancer    adenomatous polyps  . Hypercholesterolemia   . Hypertension   . Inflammatory arthritis   . Osteoarthritis    knees, spine  . Phlebitis    x2 (with pregnancy)  . 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 Pulmonary hypertension (HCC)   . Reflux esophagitis   . Rheumatoid arthritis(714.0)    positive RF, FANA, RNP, negative CCP ab and anti DNA,.  neg anti-SCL 70  . Scleroderma (HCC)    raynaud's, sclerodactyly, telangiectasias    Past Surgical History:  Procedure Laterality Date  . ABDOMINAL AORTOGRAM W/LOWER EXTREMITY Left 05/16/2019   Procedure: ABDOMINAL AORTOGRAM W/LOWER EXTREMITY;  Surgeon: Runell Gess, MD;  Location: [facility] INVASIVE CV LAB;   Service: Cardiovascular;  Laterality: Left;  . BREAST CYST ASPIRATION Left    neg  . PERIPHERAL VASCULAR BALLOON ANGIOPLASTY Left 05/16/2019   Procedure: PERIPHERAL VASCULAR BALLOON ANGIOPLASTY;  Surgeon: Runell Gess, MD;  Location: [facility] INVASIVE CV LAB;  Service: Cardiovascular;  Laterality: Left;  SFA  . VEIN LIGATION AND STRIPPING       reports that she quit smoking about 31 years ago. She has a 7.50 pack-year smoking history. She has never used smokeless tobacco. She reports that she does not drink alcohol or use drugs.  Allergies  Allergen Reactions  . Maxzide [Triamterene-Hctz] Other (See Comments)    Weakness/fatigue     Family History  Problem Relation Age of Onset  . Arthritis Mother   . Heart attack Father   . Colon cancer Neg Hx   . Breast cancer Neg Hx     Prior to Admission medications   Medication Sig Start Date End Date Taking? Authorizing Provider  acetaminophen (TYLENOL) 650 MG CR tablet Take 650-1,300 mg by mouth every 8 (eight) hours as needed for pain.    [provider]  allopurinol (ZYLOPRIM) 300 MG tablet Take 1 tablet by mouth once daily Patient taking differently: Take 300 mg by mouth daily.  03/28/19   Nina [place], MD  amLODipine (NORVASC) 10 MG tablet TAKE 1 TABLET BY MOUTH ONCE DAILY Patient taking differently: Take 10  mg by mouth daily.  08/03/18   Nina Richton, MD  aspirin 81 MG tablet Take 81 mg by mouth daily. Reported on 07/24/2015    [provider]  atorvastatin (LIPITOR) 80 MG tablet Take 1 tablet (80 mg total) by mouth daily at 6 PM. 05/17/19   Arty Baumgartner, NP  clopidogrel (PLAVIX) 75 MG tablet Take 1 tablet (75 mg total) by mouth daily with breakfast. 05/17/19   Arty Baumgartner, NP  fluticasone (FLONASE) 50 MCG/ACT nasal spray Place 2 sprays into the nose daily. 05/25/12   Nina [place], MD  folic acid (FOLVITE) 1 MG tablet Take 1 mg by mouth daily.    [provider]  gabapentin (NEURONTIN) 100 MG  capsule Take 1 capsule (100 mg total) by mouth 3 (three) times daily. 05/23/19   Nina [place], MD  glucose blood (BAYER CONTOUR TEST) test strip USE  STRIP TO CHECK GLUCOSE TWICE DAILY. Dx: E11.9 05/29/17   Nina [place], MD  losartan (COZAAR) 100 MG tablet Take 1 tablet by mouth once daily Patient taking differently: Take 100 mg by mouth daily.  03/10/19   Nina [place], MD  metFORMIN (GLUCOPHAGE) 500 MG tablet Take 1 tablet by mouth once daily Patient taking differently: Take 500 mg by mouth daily with breakfast. Give w/food. 02/21/19   Nina [place], MD  methotrexate 2.5 MG tablet Take 10 mg by mouth every Wednesday.     [provider]  metoprolol succinate (TOPROL-XL) 50 MG 24 hr tablet TAKE 1 TABLET BY MOUTH ONCE DAILY IMMEDIATELY  FOLLOWING  A  MEAL 05/19/19   Nina [place], MD  MICROLET LANCETS MISC Check sugar once daily, [employer] Microlet Lancets. Dx E11.9 10/02/14   Nina [place], MD  pantoprazole (PROTONIX) 40 MG tablet Take 1 tablet (40 mg total) by mouth daily. 05/17/19   Arty Baumgartner, NP  spironolactone (ALDACTONE) 25 MG tablet Take 1 tablet by mouth once daily Patient taking differently: Take 25 mg by mouth daily.  12/16/18   Nina Vilas, MD  traMADol (ULTRAM) 50 MG tablet Take 1 tablet (50 mg total) by mouth 2 (two) times daily as needed (pain.). 05/23/19   Nina [place], MD    Physical Exam: Vitals:   06/01/19 1417 06/01/19 1425  BP: 128/70   Pulse: 69   Resp: 16   Temp: 98.1 F (36.7 C)   TempSrc: Oral   SpO2: 97%   Weight:  102 kg  Height:  5\' 6"  (1.676 m)      Constitutional: NAD, calm, comfortable Vitals:   06/01/19 1417 06/01/19 1425  BP: 128/70   Pulse: 69   Resp: 16   Temp: 98.1 F (36.7 C)   TempSrc: Oral   SpO2: 97%   Weight:  102 kg  Height:  5\' 6"  (1.676 m)   Eyes: PERRL, lids and conjunctivae normal ENMT: Mucous membranes are moist. Posterior pharynx clear of any exudate or lesions.Normal dentition.  Neck: normal,  supple, no masses, no thyromegaly Respiratory: clear to auscultation bilaterally, no wheezing, no crackles. Normal respiratory effort. No accessory muscle use.  Cardiovascular: Regular rate and rhythm, no murmurs / rubs / gallops. No extremity edema. 2+ pedal pulses. No carotid bruits.  Abdomen: no tenderness, no masses palpated. No hepatosplenomegaly. Bowel sounds positive.  Musculoskeletal: no clubbing / cyanosis. No joint deformity upper and lower extremities. Good ROM, no contractures. Normal muscle tone.  Skin: no rashes, lesions, ulcers. No induration Neurologic: CN 2-12 grossly intact. Sensation intact, DTR normal. Strength 5/5 in  all 4.  Psychiatric: Normal judgment and insight. Alert and oriented x 3. Normal mood.   Labs on Admission: I have personally reviewed following labs and imaging studies  CBC: Recent Labs  Lab 06/01/19 1546  WBC 5.0  HGB 12.1  HCT 38.9  MCV 99.0  PLT 153   Basic Metabolic Panel: No results for input(s): NA, K, CL, CO2, GLUCOSE, BUN, CREATININE, CALCIUM, MG, PHOS in the last 168 hours. GFR: Estimated Creatinine Clearance: 58.9 mL/min (by C-G formula based on SCr of 0.95 mg/dL). Liver Function Tests: No results for input(s): AST, ALT, ALKPHOS, BILITOT, PROT, ALBUMIN in the last 168 hours. No results for input(s): LIPASE, AMYLASE in the last 168 hours. No results for input(s): AMMONIA in the last 168 hours. Coagulation Profile: No results for input(s): INR, PROTIME in the last 168 hours. Cardiac Enzymes: No results for input(s): CKTOTAL, CKMB, CKMBINDEX, TROPONINI in the last 168 hours. BNP (last 3 results) No results for input(s): PROBNP in the last 8760 hours. HbA1C: No results for input(s): HGBA1C in the last 72 hours. CBG: No results for input(s): GLUCAP in the last 168 hours. Lipid Profile: No results for input(s): CHOL, HDL, LDLCALC, TRIG, CHOLHDL, LDLDIRECT in the last 72 hours. Thyroid Function Tests: No results for input(s): TSH,  T4TOTAL, FREET4, T3FREE, THYROIDAB in the last 72 hours. Anemia Panel: No results for input(s): VITAMINB12, FOLATE, FERRITIN, TIBC, IRON, RETICCTPCT in the last 72 hours. Urine analysis:    Component Value Date/Time   COLORURINE YELLOW 11/24/2018 1004   APPEARANCEUR CLEAR 11/24/2018 1004   LABSPEC 1.025 11/24/2018 1004   PHURINE 5.5 11/24/2018 1004   GLUCOSEU NEGATIVE 11/24/2018 1004   HGBUR NEGATIVE 11/24/2018 1004   BILIRUBINUR SMALL (A) 11/24/2018 1004   KETONESUR NEGATIVE 11/24/2018 1004   UROBILINOGEN 1.0 11/24/2018 1004   NITRITE NEGATIVE 11/24/2018 1004   LEUKOCYTESUR TRACE (A) 11/24/2018 1004    Radiological Exams on Admission: No results found.  EKG: Independently reviewed. Pending.  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  She has h/o pad and is s/o balloon angioplasty as well however circulation has been compromised , I suspect embolic phenomenon as well could be contributing to this. Pt is continued on her Plavix asa and statin as she has had recent intervention.  Pt does not have any leucocytosis or cellulitis and does not warrant abx currently ,but should received preop prophylactic abx.   DM II: ssi / accucheck/ hba1c.  Home regimen of metformin held. Carb consistent diet.   HTN: Pt continued on amlodipine 10 mg. Losartan/ and metoprolol held. Cardiac diet.   DVT prophylaxis: Lovenox. Code Status: Full  Family Communication: Spouse Nina ronald at bedside.  Disposition Plan: D/C home  Consults called: Dr.Troxel podiatry.  Admission status: Inpatient.   Gertha Calkin MD [facility] If 7PM-7AM, please contact  night-coverage [email or web address] Password [email or web address] 06/01/2019, 4:18 PM

## 2019-06-01 NOTE — Consult Note (Signed)
[facility]                                                      Patient Demographics  Nina Perez, is a 80 y.o. female   MRN: [id number]   DOB - [birth year]  Admit Date - 06/01/2019    Outpatient Primary MD for the patient is Nina Pheasant, MD  Consult requested in the Hospital by Para Skeans, MD, On 06/01/2019    Reason for consult gangrene fourth toe of the left foot and ischemic pain left foot   With History of -  Past Medical History:  Diagnosis Date  . Barrett's esophagus   . Cancer ([place])    skin ca  . Diabetes mellitus ([place])   . Gout   . History of colon cancer    adenomatous polyps  . Hypercholesterolemia   . Hypertension   . Inflammatory arthritis   . Osteoarthritis    knees, spine  . Phlebitis    x2 (with pregnancy)  . 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 Pulmonary hypertension ([place])   . Reflux esophagitis   . Rheumatoid arthritis(714.0)    positive RF, FANA, RNP, negative CCP ab and anti DNA,.  neg anti-SCL 70  . 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 ABDOMINAL AORTOGRAM W/LOWER EXTREMITY Left 05/16/2019   Procedure: ABDOMINAL AORTOGRAM W/LOWER EXTREMITY;  Surgeon: Lorretta Harp, MD;  Location: [place] [facility];  Service: Cardiovascular;  Laterality: Left;  . BREAST CYST ASPIRATION Left    neg  . PERIPHERAL VASCULAR BALLOON ANGIOPLASTY Left 05/16/2019   Procedure: PERIPHERAL VASCULAR BALLOON ANGIOPLASTY;  Surgeon: Lorretta Harp, MD;  Location: [place] [facility];  Service: Cardiovascular;  Laterality: Left;  SFA  . VEIN LIGATION AND STRIPPING      in for   No chief complaint on file.    HPI  Nina Perez  is a 79 y.o. female, patient's is diabetic and has a history of peripheral arterial disease.  She had an angioplasty done  back and early January of this year but unfortunately she continued to have some vascular changes to the left foot particular again.  She is been followed by the wound care center for an ulcer on the dorsal lateral aspect of the left foot.  This is stable and progressing slowly.    Review of Systems she is alert well oriented and pleasant today.  Other systems are negative and the lower extremity findings will be listed later.  Social History Social History   Tobacco Use  . Smoking status: Former Smoker    Packs/day: 0.50    Years: 15.00    Pack years: 7.50    Quit date: 05/12/1988    Years since quitting: 31.0  . Smokeless tobacco: Never Used  Substance Use Topics  . Alcohol use: No    Alcohol/week: 0.0 standard drinks    Family History Family History  Problem Relation Age of Onset  . Arthritis Mother   . Heart attack Father   . Colon cancer Neg Hx   . Breast cancer Neg Hx     Prior to Admission medications   Medication Sig Start Date End Date Taking? Authorizing Provider  acetaminophen (TYLENOL) 650 MG  CR tablet Take 650 mg by mouth every 4 (four) hours as needed for pain.    Yes [provider]  atorvastatin (LIPITOR) 80 MG tablet Take 1 tablet (80 mg total) by mouth daily at 6 PM. 05/17/19  Yes Cheryln Manly, NP  clopidogrel (PLAVIX) 75 MG tablet Take 1 tablet (75 mg total) by mouth daily with breakfast. 05/17/19  Yes Cheryln Manly, NP  folic acid (FOLVITE) 1 MG tablet Take 1 mg by mouth every evening.    Yes [provider]  allopurinol (ZYLOPRIM) 300 MG tablet Take 1 tablet by mouth once daily Patient taking differently: Take 300 mg by mouth daily.  03/28/19   Nina Pheasant, MD  amLODipine (NORVASC) 10 MG tablet TAKE 1 TABLET BY MOUTH ONCE DAILY Patient taking differently: Take 10 mg by mouth daily.  08/03/18   Nina Pheasant, MD  aspirin 81 MG tablet Take 81 mg by mouth daily. Reported on 07/24/2015    [provider]  fluticasone  (FLONASE) 50 MCG/ACT nasal spray Place 2 sprays into the nose daily. Patient not taking: Reported on 06/01/2019 05/25/12   Nina Pheasant, MD  gabapentin (NEURONTIN) 100 MG capsule Take 1 capsule (100 mg total) by mouth 3 (three) times daily. 05/23/19   Nina Pheasant, MD  glucose blood (BAYER CONTOUR TEST) test strip USE  STRIP TO CHECK GLUCOSE TWICE DAILY. Dx: E11.9 05/29/17   Nina Pheasant, MD  losartan (COZAAR) 100 MG tablet Take 1 tablet by mouth once daily Patient taking differently: Take 100 mg by mouth daily.  03/10/19   Nina Pheasant, MD  metFORMIN (GLUCOPHAGE) 500 MG tablet Take 1 tablet by mouth once daily Patient taking differently: Take 500 mg by mouth daily with breakfast. Give w/food. 02/21/19   Nina Pheasant, MD  methotrexate 2.5 MG tablet Take 10 mg by mouth every Wednesday.     [provider]  metoprolol succinate (TOPROL-XL) 50 MG 24 hr tablet TAKE 1 TABLET BY MOUTH ONCE DAILY IMMEDIATELY  FOLLOWING  A  MEAL 05/19/19   Nina Pheasant, MD  MICROLET LANCETS MISC Check sugar once daily, [employer] Microlet Lancets. Dx E11.9 10/02/14   Nina Pheasant, MD  pantoprazole (PROTONIX) 40 MG tablet Take 1 tablet (40 mg total) by mouth daily. 05/17/19   Cheryln Manly, NP  spironolactone (ALDACTONE) 25 MG tablet Take 1 tablet by mouth once daily Patient taking differently: Take 25 mg by mouth daily.  12/16/18   Nina Pheasant, MD  traMADol (ULTRAM) 50 MG tablet Take 1 tablet (50 mg total) by mouth 2 (two) times daily as needed (pain.). 05/23/19   Nina Pheasant, MD    Anti-infectives (From admission, onward)   None      Scheduled Meds: . enoxaparin (LOVENOX) injection  40 mg Subcutaneous Q24H  . insulin aspart  0-24 Units Subcutaneous Q4H  . mupirocin ointment  1 application Nasal BID   Continuous Infusions: . lactated ringers     PRN Meds:.  Allergies  Allergen Reactions  . Maxzide [Triamterene-Hctz] Other (See Comments)    Weakness/fatigue     Physical  Exam  Vitals  Blood pressure 128/70, pulse 69, temperature 98.1 F (36.7 C), temperature source Oral, resp. rate 16, height 5\' 6"  (1.676 m), weight 102 kg, last menstrual period 05/25/1988, SpO2 97 %.  Lower Extremity exam:  Vascular: Nonpalpable bilateral.  Patient does have some cyanosis and concerns for early vascular severe changes in the left great toe now as well but currently the toe is viable.  Fourth toe is completely gangrenous.  Dermatological: Blackened eschar to the distal two thirds of the left fourth toe significant for gangrenous changes and avascular tissues.  Small wound on the dorsal lateral aspect of the foot has a granular base.  Approximately 8 mm in length and 5 mm in width with several millimeters of depth.  Does not appear to have a severe infection nor any purulence draining from the region but we have to be concerned about progressing infection as long as the toe sits in a gangrenous state.  Neurological: Some peripheral neuropathy but is experiencing pain associated with the ischemia.  Ortho: Gangrene fourth toe as mentioned above.  Data Review  CBC Recent Labs  Lab 06/01/19 1546  WBC 5.0  HGB 12.1  HCT 38.9  PLT 153  MCV 99.0  MCH 30.8  MCHC 31.1  RDW 17.7*   ------------------------------------------------------------------------------------------------------------------  Chemistries  Recent Labs  Lab 06/01/19 1546  CREATININE 1.24*   ------------------------------------------------------------------------------------------------------------------ estimated creatinine clearance is 45.1 mL/min (A) (by C-G formula based on SCr of 1.24 mg/dL (H)). ------------------------------------------------------------------------------------------------------------------ Recent Labs    06/01/19 1546  TSH 3.558   Urinalysis    Component Value Date/Time   COLORURINE YELLOW 11/24/2018 1004   APPEARANCEUR CLEAR 11/24/2018 1004   LABSPEC 1.025  11/24/2018 1004   PHURINE 5.5 11/24/2018 1004   GLUCOSEU NEGATIVE 11/24/2018 1004   HGBUR NEGATIVE 11/24/2018 1004   BILIRUBINUR SMALL (A) 11/24/2018 1004   KETONESUR NEGATIVE 11/24/2018 1004   UROBILINOGEN 1.0 11/24/2018 1004   NITRITE NEGATIVE 11/24/2018 1004   LEUKOCYTESUR TRACE (A) 11/24/2018 1004     Imaging results:   No results found.  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  Gangrene fourth toe left foot.  I have her scheduled for fourth toe amputation tomorrow also have asked for a vascular consult and has spoken with Dr. Delana Meyer and hope that they can take a look at her hopefully by Friday and see if they can improve her peripheral circulation.  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  Albertine Patricia M.D on 06/01/2019 at 5:21 PM  Thank you for the consult, we will follow the patient with you in the Hospital.

## 2019-06-02 ENCOUNTER — Inpatient Hospital Stay: Payer: Medicare Other | Admitting: Anesthesiology

## 2019-06-02 ENCOUNTER — Ambulatory Visit: Admission: RE | Admit: 2019-06-02 | Payer: Medicare Other | Source: Home / Self Care | Admitting: Podiatry

## 2019-06-02 ENCOUNTER — Encounter
Admission: AD | Disposition: A | Discharge status: Home Health | Payer: Self-pay | Source: Home / Self Care | Attending: Internal Medicine

## 2019-06-02 ENCOUNTER — Encounter: Payer: Self-pay | Admitting: Internal Medicine

## 2019-06-02 ENCOUNTER — Other Ambulatory Visit (INDEPENDENT_AMBULATORY_CARE_PROVIDER_SITE_OTHER): Payer: Self-pay | Admitting: Vascular Surgery

## 2019-06-02 DIAGNOSIS — N183 Chronic kidney disease, stage 3 unspecified: Secondary | ICD-10-CM | POA: Diagnosis present

## 2019-06-02 DIAGNOSIS — I70262 Atherosclerosis of native arteries of extremities with gangrene, left leg: Secondary | ICD-10-CM

## 2019-06-02 DIAGNOSIS — E1159 Type 2 diabetes mellitus with other circulatory complications: Secondary | ICD-10-CM

## 2019-06-02 DIAGNOSIS — K2270 Barrett's esophagus without dysplasia: Secondary | ICD-10-CM

## 2019-06-02 DIAGNOSIS — N1831 Chronic kidney disease, stage 3a: Secondary | ICD-10-CM

## 2019-06-02 DIAGNOSIS — E7800 Pure hypercholesterolemia, unspecified: Secondary | ICD-10-CM

## 2019-06-02 HISTORY — PX: AMPUTATION TOE: SHX6595

## 2019-06-02 LAB — COMPREHENSIVE METABOLIC PANEL
ALT: 12 U/L (ref 0–44)
AST: 18 U/L (ref 15–41)
Albumin: 3.1 g/dL — ABNORMAL LOW (ref 3.5–5.0)
Alkaline Phosphatase: 55 U/L (ref 38–126)
Anion gap: 7 (ref 5–15)
BUN: 22 mg/dL (ref 8–23)
CO2: 26 mmol/L (ref 22–32)
Calcium: 9.9 mg/dL (ref 8.9–10.3)
Chloride: 103 mmol/L (ref 98–111)
Creatinine, Ser: 1 mg/dL (ref 0.44–1.00)
GFR calc Af Amer: >60 mL/min (ref >60)
GFR calc non Af Amer: 54 mL/min — ABNORMAL LOW (ref >60)
Glucose, Bld: 82 mg/dL (ref 70–99)
Potassium: 4.3 mmol/L (ref 3.5–5.1)
Sodium: 136 mmol/L (ref 135–145)
Total Bilirubin: 0.9 mg/dL (ref 0.3–1.2)
Total Protein: 6.5 g/dL (ref 6.5–8.1)

## 2019-06-02 LAB — GLUCOSE, CAPILLARY
Glucose-Capillary: 82 mg/dL (ref 70–99)
Glucose-Capillary: 78 mg/dL (ref 70–99)
Glucose-Capillary: 79 mg/dL (ref 70–99)
Glucose-Capillary: 64 mg/dL — ABNORMAL LOW (ref 70–99)
Glucose-Capillary: 93 mg/dL (ref 70–99)
Glucose-Capillary: 106 mg/dL — ABNORMAL HIGH (ref 70–99)
Glucose-Capillary: 86 mg/dL (ref 70–99)

## 2019-06-02 LAB — CBC
HCT: 37 % (ref 36.0–46.0)
Hemoglobin: 11.7 g/dL — ABNORMAL LOW (ref 12.0–15.0)
MCH: 30.9 pg (ref 26.0–34.0)
MCHC: 31.6 g/dL (ref 30.0–36.0)
MCV: 97.6 fL (ref 80.0–100.0)
Platelets: 132 10*3/uL — ABNORMAL LOW (ref 150–400)
RBC: 3.79 MIL/uL — ABNORMAL LOW (ref 3.87–5.11)
RDW: 17.8 % — ABNORMAL HIGH (ref 11.5–15.5)
WBC: 5.2 10*3/uL (ref 4.0–10.5)
nRBC: 0 % (ref 0.0–0.2)

## 2019-06-02 LAB — PROTIME-INR
INR: 1.1 (ref 0.8–1.2)
Prothrombin Time: 13.7 seconds (ref 11.4–15.2)

## 2019-06-02 SURGERY — AMPUTATION, TOE
Anesthesia: General | Site: Toe | Laterality: Left

## 2019-06-02 MED ORDER — INSULIN ASPART 100 UNIT/ML ~~LOC~~ SOLN: 0.0000 [IU] | Freq: Every day | SUBCUTANEOUS | Status: DC

## 2019-06-02 MED ORDER — PHENYLEPHRINE HCL (PRESSORS) 10 MG/ML IV SOLN
INTRAVENOUS | Status: DC | PRN
  Administered 2019-06-02: 100 ug via INTRAVENOUS

## 2019-06-02 MED ORDER — PROPOFOL 500 MG/50ML IV EMUL
INTRAVENOUS | Status: AC
  Filled 2019-06-02: qty 50

## 2019-06-02 MED ORDER — LIDOCAINE HCL 1 % IJ SOLN
INTRAMUSCULAR | Status: DC | PRN
  Administered 2019-06-02: 5 mL

## 2019-06-02 MED ORDER — FENTANYL CITRATE (PF) 100 MCG/2ML IJ SOLN
INTRAMUSCULAR | Status: AC
  Filled 2019-06-02: qty 2

## 2019-06-02 MED ORDER — SODIUM CHLORIDE 0.9 % IV SOLN: INTRAVENOUS | Status: DC

## 2019-06-02 MED ORDER — BUPIVACAINE HCL 0.5 % IJ SOLN
INTRAMUSCULAR | Status: DC | PRN
  Administered 2019-06-02: 15 mL

## 2019-06-02 MED ORDER — CEFAZOLIN SODIUM-DEXTROSE 2-3 GM-%(50ML) IV SOLR
INTRAVENOUS | Status: DC | PRN
  Administered 2019-06-02: 2 g via INTRAVENOUS

## 2019-06-02 MED ORDER — ACETAMINOPHEN 160 MG/5ML PO SOLN
325.0000 mg | ORAL | Status: DC | PRN
  Filled 2019-06-02: qty 20.3

## 2019-06-02 MED ORDER — CEFAZOLIN SODIUM-DEXTROSE 2-4 GM/100ML-% IV SOLN
2.0000 g | Freq: Once | INTRAVENOUS | Status: AC
  Administered 2019-06-03: 2 g via INTRAVENOUS
  Filled 2019-06-02: qty 100

## 2019-06-02 MED ORDER — CEFAZOLIN SODIUM-DEXTROSE 1-4 GM/50ML-% IV SOLN
1.0000 g | Freq: Three times a day (TID) | INTRAVENOUS | Status: DC
  Administered 2019-06-02 – 2019-06-06 (×10): 1 g via INTRAVENOUS
  Filled 2019-06-02 (×15): qty 50

## 2019-06-02 MED ORDER — MORPHINE SULFATE (PF) 2 MG/ML IV SOLN
2.0000 mg | INTRAVENOUS | Status: DC | PRN
  Administered 2019-06-02 – 2019-06-08 (×3): 2 mg via INTRAVENOUS
  Filled 2019-06-02 (×4): qty 1

## 2019-06-02 MED ORDER — FENTANYL CITRATE (PF) 100 MCG/2ML IJ SOLN: 25.0000 ug | INTRAMUSCULAR | Status: DC | PRN

## 2019-06-02 MED ORDER — ACETAMINOPHEN 325 MG PO TABS: 325.0000 mg | ORAL_TABLET | ORAL | Status: DC | PRN

## 2019-06-02 MED ORDER — HYDROCODONE-ACETAMINOPHEN 7.5-325 MG PO TABS
1.0000 | ORAL_TABLET | Freq: Once | ORAL | Status: DC | PRN
  Filled 2019-06-02: qty 1

## 2019-06-02 MED ORDER — OXYCODONE-ACETAMINOPHEN 7.5-325 MG PO TABS
1.0000 | ORAL_TABLET | ORAL | Status: DC | PRN
  Administered 2019-06-03: 1 via ORAL
  Filled 2019-06-02 (×2): qty 1

## 2019-06-02 MED ORDER — PROPOFOL 500 MG/50ML IV EMUL
INTRAVENOUS | Status: DC | PRN
  Administered 2019-06-02: 150 ug/kg/min via INTRAVENOUS

## 2019-06-02 MED ORDER — CLINDAMYCIN PHOSPHATE 300 MG/50ML IV SOLN
300.0000 mg | Freq: Once | INTRAVENOUS | Status: DC
  Filled 2019-06-02: qty 50

## 2019-06-02 MED ORDER — INSULIN ASPART 100 UNIT/ML ~~LOC~~ SOLN
0.0000 [IU] | Freq: Three times a day (TID) | SUBCUTANEOUS | Status: DC
  Administered 2019-06-03 – 2019-06-07 (×2): 1 [IU] via SUBCUTANEOUS
  Administered 2019-06-08: 2 [IU] via SUBCUTANEOUS
  Filled 2019-06-02 (×2): qty 1

## 2019-06-02 MED ORDER — PROPOFOL 10 MG/ML IV BOLUS
INTRAVENOUS | Status: AC
  Filled 2019-06-02: qty 20

## 2019-06-02 MED ORDER — PROMETHAZINE HCL 25 MG/ML IJ SOLN: 6.2500 mg | INTRAMUSCULAR | Status: DC | PRN

## 2019-06-02 MED ORDER — FENTANYL CITRATE (PF) 100 MCG/2ML IJ SOLN
INTRAMUSCULAR | Status: DC | PRN
  Administered 2019-06-02: 25 ug via INTRAVENOUS

## 2019-06-02 SURGICAL SUPPLY — 39 items
"PENCIL ELECTRO HAND CTR " (MISCELLANEOUS) ×1 IMPLANT
BLADE MED AGGRESSIVE (BLADE) ×3 IMPLANT
BLADE SURG 15 STRL LF DISP TIS (BLADE) ×4 IMPLANT
BLADE SURG 15 STRL SS (BLADE) ×8
BNDG CONFORM 3 STRL LF (GAUZE/BANDAGES/DRESSINGS) ×3 IMPLANT
BNDG ELASTIC 4X5.8 VLCR NS LF (GAUZE/BANDAGES/DRESSINGS) ×3 IMPLANT
BNDG ESMARK 4X12 TAN STRL LF (GAUZE/BANDAGES/DRESSINGS) ×3 IMPLANT
BNDG GAUZE 4.5X4.1 6PLY STRL (MISCELLANEOUS) ×3 IMPLANT
CANISTER SUCT 1200ML W/VALVE (MISCELLANEOUS) ×3 IMPLANT
COVER WAND RF STERILE (DRAPES) ×3 IMPLANT
CUFF TOURN SGL QUICK 12 (TOURNIQUET CUFF) IMPLANT
CUFF TOURN SGL QUICK 18X4 (TOURNIQUET CUFF) ×2 IMPLANT
DRAPE FLUOR MINI C-ARM 54X84 (DRAPES) ×3 IMPLANT
DURAPREP 26ML APPLICATOR (WOUND CARE) ×3 IMPLANT
ELECT REM PT RETURN 9FT ADLT (ELECTROSURGICAL) ×3
ELECTRODE REM PT RTRN 9FT ADLT (ELECTROSURGICAL) ×1 IMPLANT
GAUZE SPONGE 4X4 12PLY STRL (GAUZE/BANDAGES/DRESSINGS) ×3 IMPLANT
GAUZE XEROFORM 1X8 LF (GAUZE/BANDAGES/DRESSINGS) ×3 IMPLANT
GLOVE BIO SURGEON STRL SZ8 (GLOVE) ×3 IMPLANT
GLOVE INDICATOR 8.0 STRL GRN (GLOVE) ×3 IMPLANT
GOWN STRL REUS W/ TWL LRG LVL3 (GOWN DISPOSABLE) ×2 IMPLANT
GOWN STRL REUS W/TWL LRG LVL3 (GOWN DISPOSABLE) ×4
HEMOSTAT SURGICEL 2X3 (HEMOSTASIS) ×2 IMPLANT
KIT TURNOVER KIT A (KITS) ×3 IMPLANT
LABEL OR SOLS (LABEL) ×3 IMPLANT
NDL HYPO 25X1 1.5 SAFETY (NEEDLE) ×3 IMPLANT
NDL SAFETY ECLIPSE 18X1.5 (NEEDLE) ×1 IMPLANT
NEEDLE HYPO 18GX1.5 SHARP (NEEDLE) ×2
NEEDLE HYPO 25X1 1.5 SAFETY (NEEDLE) ×9 IMPLANT
NS IRRIG 500ML POUR BTL (IV SOLUTION) ×3 IMPLANT
PACK EXTREMITY ARMC (MISCELLANEOUS) ×3 IMPLANT
PENCIL ELECTRO HAND CTR (MISCELLANEOUS) ×3 IMPLANT
RASP SM TEAR CROSS CUT (RASP) ×3 IMPLANT
STOCKINETTE STRL 6IN 960660 (GAUZE/BANDAGES/DRESSINGS) ×3 IMPLANT
SUT ETH BLK MONO 3 0 FS 1 12/B (SUTURE) ×3 IMPLANT
SUT ETHILON 5 0 PS 2 18 (SUTURE) ×3 IMPLANT
SUT VIC AB 4-0 FS2 27 (SUTURE) ×3 IMPLANT
SWAB CULTURE AMIES ANAERIB BLU (MISCELLANEOUS) ×3 IMPLANT
SYR 10ML LL (SYRINGE) ×3 IMPLANT

## 2019-06-02 NOTE — H&P (Signed)
H and P has been reviewed and no changes are noted.  

## 2019-06-02 NOTE — Progress Notes (Signed)
Inpatient Diabetes Program Recommendations  AACE/ADA: New Consensus Statement on Inpatient Glycemic Control  Target Ranges:  Prepandial:   less than 140 mg/dL      Peak postprandial:   less than 180 mg/dL (1-2 hours)      Critically ill patients:  140 - 180 mg/dL  Results for Nina Perez, Nina Perez (MRN [id number]) as of 06/02/2019 13:10  Ref. Range 06/01/2019 16:58 06/01/2019 19:52 06/02/2019 04:59 06/02/2019 08:43 06/02/2019 10:59 06/02/2019 12:31 06/02/2019 13:06  Glucose-Capillary Latest Ref Range: 70 - 99 mg/dL 85 96 82 78 79 64 (L) 93   Results for Nina Perez, Nina Perez (MRN [id number]) as of 06/02/2019 13:10  Ref. Range 06/01/2019 15:46  Hemoglobin A1C Latest Ref Range: 4.8 - 5.6 % 5.7 (H)   Review of Glycemic Control  Diabetes history: DM2 Outpatient Diabetes medications: Metformin 500 mg QAM Current orders for Inpatient glycemic control: Novolog 0-9 units TID with meals, Novolog 0-5 units QHS  Inpatient Diabetes Program Recommendations:   HgbA1C: A1C 5.7% on 06/01/19 indicating an average glucose of 117 mg/dl over the past 2-3 months.  NOTE: Noted consult for diabetes coordinator for 'dietary consult and diabetes diet'. Placed order for RD for dietary consult on 06/01/19 (per MD order). Chart reviewed. Patient is currently in PACU following surgery today. NO insulin has been given since admitted and glucose has ranged from 64-96 mg/dl. A1C 5.7% on 06/01/19 indicating an average glucose of 117 mg/dl. Will sign off consult for inpatient diabetes coordinator. Please re-consult if needed.  Thanks, Barnie Alderman, RN, MSN, CDE Diabetes Coordinator Inpatient Diabetes Program [phone] (Team Pager from 8am to 5pm)

## 2019-06-02 NOTE — Progress Notes (Signed)
Nutrition Brief Note  RD working remotely.  RD consulted for diabetes education. Patient admitted with gangrene of fourth toe on left foot and is s/p amputation of fourth toe to MTP joint this afternoon. Education inappropriate at this time.  Will attempt to provide DM education prior to discharge.   Medications and labs reviewed  Lab Results  Component Value Date   HGBA1C 5.7 (H) 06/01/2019   Lajuan Lines, RD, LDN Clinical Nutrition [facility] Telephone [phone] After Hours/Weekend Pager: [phone]

## 2019-06-02 NOTE — Op Note (Signed)
Operative note   Surgeon: Dr. Albertine Patricia, DPM.    Assistant: None    Preop diagnosis: Gangrene fourth toe left foot    Postop diagnosis: Same    Procedure:   1.  Amputation fourth toe to MTP joint left foot         EBL: 20 cc    Anesthesia:IV sedation given by the anesthesia team I injected the toe preoperatively with 8 cc of lidocaine plain and 5 cc of Marcaine 0.5% plain.  Postoperatively I injected Marcaine 10 cc around the operative site.    Hemostasis: Ankle tourniquet at 250 mils mercury pressure for about 10 minutes.    Specimen: Gangrenous fourth toe left foot    Complications: None    Operative indications: Gangrene fourth toe right foot    Procedure:  Patient was brought into the OR and placed on the operating table in thesupine position. After anesthesia was obtained theleft lower extremity was prepped and draped in usual sterile fashion.  Operative Report: This time to direct to the left foot 2 similar incisions were made around the base of the fourth toe with some extension up onto the still viable skin at the base of the toe medial laterally for future closure.  This incision was carried down to the deep tissues bone and capsular tissue the capsule and tendinous tissues around the fourth MTP joint was then released with a deep 15 blade.  The toe was then disarticulated and removed and sent to pathology.  No purulence or significant infection was noted.  Patient does have some general cellulitis of the foot and leg so I will keep her on some cefazolin at this point and gave her that intraoperatively.  Bleeders were clamped and bovied as required.  Some Surgicel was packed into one area for about 5 minutes which helped reduce bleeding to the region.  Tourniquet was released while the wound was still open and any bleeders were clamped and bovied.  There was an copiously irrigated with 250 cc of saline.  3-0 Vicryl was used with simple erupted sutures to close the  capsular and deep tissue to the area.  Skin was then closed with 3-0 nylon simple erupted horizontal mattress combination.  A sterile compressive dressing was placed across the wound consisting of Xeroform gauze 4 x 4's conformer Kerlix and Ace wrap.  Also put some Kerlix and Ace wrap up around her upper calf and lower leg in order to control swelling and try to reduce some of the inflammation she had in the skin there.    Patient tolerated the procedure and anesthesia well.  Was transported from the OR to the PACU with all vital signs stable and vascular status intact. To be discharged per routine protocol.  Will follow up in approximately 1 week in the outpatient clinic.

## 2019-06-02 NOTE — Transfer of Care (Signed)
Immediate Anesthesia Transfer of Care Note  Patient: Nina Perez  Procedure(s) Performed: AMPUTATION TOE (Left Toe)  Patient Location: PACU  Anesthesia Type:General  Level of Consciousness: awake, alert  and drowsy  Airway & Oxygen Therapy: Patient Spontanous Breathing and Patient connected to nasal cannula oxygen  Post-op Assessment: Report given to RN and Post -op Vital signs reviewed and stable  Post vital signs: Reviewed and stable  Last Vitals:  Vitals Value Taken Time  BP 128/64 06/02/19 1227  Temp 36.7 C 06/02/19 1226  Pulse 68 06/02/19 1229  Resp 14 06/02/19 1229  SpO2 99 % 06/02/19 1229  Vitals shown include unvalidated device data.  Last Pain:  Vitals:   06/02/19 1226  TempSrc: Temporal  PainSc: 0-No pain         Complications: No apparent anesthesia complications

## 2019-06-02 NOTE — Progress Notes (Addendum)
PROGRESS NOTE    Nina Perez  [id number] DOB: Apr 15, 1941 DOA: 06/01/2019 PCP: Einar Pheasant, MD    Brief Narrative:  79 y/o female with history of RA, HTN, DM, admitted to the hospital with gangrene of 4th left MTP. She has undergone amputation and is currently being evaluated for further vascular work up.   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 Gangrene of 4th MTP. Seen by podiatry. S/p amputation. He is on IV ancef. Continue to follow clinically 2. PAD. Seen by vascular surgery and plans are for repeat left lower extremity angiogram with possible intervention. Continue on aspirin, plavix and statin 3. Diabetes. Blood sugars stable. Continue on SSI. Holding oral agents 4. HLD. Continue on statin 5. Rheumatoid arthritis. No signs of flare. Continue on methotrexate  6. HTN. Stable on losartan   7. CKD stage 3. Creatinine is currently at baseline   DVT prophylaxis: lovenox Code Status: full code Family Communication: discussed with husband at bedside Disposition Plan: pending physical therapy evaluation   Consultants:   Podiatry  Vascular surgery  Procedures:   1/21: Amputation fourth toe to MTP joint left foot  Antimicrobials:   Ancef 1/21>    Subjective: Pain is controlled right now. No shortness of breath or chest pain  Objective: Vitals:   06/02/19 1338 06/02/19 1507 06/02/19 1613 06/02/19 1712  BP: (!) 161/87 137/70 (!) 149/72 (!) 160/76  Pulse: 69 76 77 79  Resp: 16  18 18   Temp: 98.1 F (36.7 C) (!) 97.3 F (36.3 C) 97.6 F (36.4 C) 98 F (36.7 C)  TempSrc: Oral     SpO2: 97% 94% 94% 93%  Weight:      Height:        Intake/Output Summary (Last 24 hours) at 06/02/2019 1906 Last data filed at 06/02/2019 1228 Gross per 24 hour  Intake  381.67 ml  Output 205 ml  Net 176.67 ml   Filed Weights   06/01/19 1425 06/02/19 1107  Weight: 102 kg 102 kg    Examination:  General exam: Appears calm and comfortable  Respiratory system: Clear to auscultation. Respiratory effort normal. Cardiovascular system: S1 & S2 heard, RRR. No JVD, murmurs, rubs, gallops or clicks. No pedal edema. Gastrointestinal system: Abdomen is nondistended, soft and nontender. No organomegaly or masses felt. Normal bowel sounds heard. Central nervous system: Alert and oriented. No focal neurological deficits. Extremities: left foot is wrapped in dressing and ace wrap Skin: No rashes, lesions or ulcers Psychiatry: Judgement and insight appear normal. Mood & affect appropriate.     Data Reviewed: I have personally reviewed following labs and imaging studies  CBC: Recent Labs  Lab 06/01/19 1546 06/02/19 0432  WBC 5.0 5.2  HGB 12.1 11.7*  HCT 38.9 37.0  MCV 99.0 97.6  PLT 153 [id number]*   Basic Metabolic Panel: Recent Labs  Lab 06/01/19 1546 06/02/19 0432  NA  --  136  K  --  4.3  CL  --  103  CO2  --  26  GLUCOSE  --  82  BUN  --  22  CREATININE 1.24* 1.00  CALCIUM  --  9.9   GFR: Estimated Creatinine Clearance: 55.9 mL/min (by C-G formula based on SCr of 1 mg/dL). Liver Function Tests: Recent Labs  Lab  06/02/19 0432  AST 18  ALT 12  ALKPHOS 55  BILITOT 0.9  PROT 6.5  ALBUMIN 3.1*   No results for input(s): LIPASE, AMYLASE in the last 168 hours. No results for input(s): AMMONIA in the last 168 hours. Coagulation Profile: Recent Labs  Lab 06/02/19 0432  INR 1.1   Cardiac Enzymes: No results for input(s): CKTOTAL, CKMB, CKMBINDEX, TROPONINI in the last 168 hours. BNP (last 3 results) No results for input(s): PROBNP in the last 8760 hours. HbA1C: Recent Labs    06/01/19 1546  HGBA1C 5.7*   CBG: Recent Labs  Lab 06/02/19 0843 06/02/19 1059 06/02/19 1231 06/02/19 1306 06/02/19 1713  GLUCAP 78 79 64* 93 106*    Lipid Profile: No results for input(s): CHOL, HDL, LDLCALC, TRIG, CHOLHDL, LDLDIRECT in the last 72 hours. Thyroid Function Tests: Recent Labs    06/01/19 1546  TSH 3.558   Anemia Panel: No results for input(s): VITAMINB12, FOLATE, FERRITIN, TIBC, IRON, RETICCTPCT in the last 72 hours. Sepsis Labs: No results for input(s): PROCALCITON, LATICACIDVEN in the last 168 hours.  Recent Results (from the past 240 hour(s))  Surgical PCR screen     Status: None   Collection Time: 06/01/19  2:38 PM   Specimen: Nasal Mucosa; Nasal Swab  Result Value Ref Range Status   MRSA, PCR NEGATIVE NEGATIVE Final   Staphylococcus aureus NEGATIVE NEGATIVE Final    Comment: (NOTE) The Xpert SA Assay (FDA approved for NASAL specimens in patients 46 years of age and older), is one component of a comprehensive surveillance program. It is not intended to diagnose infection nor to guide or monitor treatment. Performed at [facility], [address] [place], [place] [address]   Respiratory Panel by RT PCR (Flu A&B, Covid) - Nasopharyngeal Swab     Status: None   Collection Time: 06/01/19  5:15 PM   Specimen: Nasopharyngeal Swab  Result Value Ref Range Status   SARS Coronavirus 2 by RT PCR NEGATIVE NEGATIVE Final    Comment: (NOTE) SARS-CoV-2 target nucleic acids are NOT DETECTED. The SARS-CoV-2 RNA is generally detectable in upper respiratoy specimens during the acute phase of infection. The lowest concentration of SARS-CoV-2 viral copies this assay can detect is 131 copies/mL. A negative result does not preclude SARS-Cov-2 infection and should not be used as the sole basis for treatment or other patient management decisions. A negative result may occur with  improper specimen collection/handling, submission of specimen other than nasopharyngeal swab, presence of viral mutation(s) within the areas targeted by this assay, and inadequate number of viral copies (<131 copies/mL). A negative  result must be combined with clinical observations, patient history, and epidemiological information. The expected result is Negative. Fact Sheet for Patients:  PinkCheek.be Fact Sheet for Healthcare Providers:  GravelBags.it This test is not yet ap proved or cleared by the [place] FDA and  has been authorized for detection and/or diagnosis of SARS-CoV-2 by FDA under an Emergency Use Authorization (EUA). This EUA will remain  in effect (meaning this test can be used) for the duration of the COVID-19 declaration under Section 564(b)(1) of the Act, 21 U.S.C. section 360bbb-3(b)(1), unless the authorization is terminated or revoked sooner.    Influenza A by PCR NEGATIVE NEGATIVE Final   Influenza B by PCR NEGATIVE NEGATIVE Final    Comment: (NOTE) The Xpert Xpress SARS-CoV-2/FLU/RSV assay is intended as an aid in  the diagnosis of influenza from Nasopharyngeal swab specimens and  should not be used as a  sole basis for treatment. Nasal washings and  aspirates are unacceptable for Xpert Xpress SARS-CoV-2/FLU/RSV  testing. Fact Sheet for Patients: PinkCheek.be Fact Sheet for Healthcare Providers: GravelBags.it This test is not yet approved or cleared by the [place] FDA and  has been authorized for detection and/or diagnosis of SARS-CoV-2 by  FDA under an Emergency Use Authorization (EUA). This EUA will remain  in effect (meaning this test can be used) for the duration of the  Covid-19 declaration under Section 564(b)(1) of the Act, 21  U.S.C. section 360bbb-3(b)(1), unless the authorization is  terminated or revoked. Performed at [facility], [address] [place], [place] [address]          Radiology Studies: No results found.      Scheduled Meds: . allopurinol  300 mg Oral Daily  . amLODipine  10 mg Oral Daily  . aspirin EC  81 mg  Oral Daily  . atorvastatin  80 mg Oral q1800  . clopidogrel  75 mg Oral Q breakfast  . enoxaparin (LOVENOX) injection  40 mg Subcutaneous Q24H  . folic acid  1 mg Oral QPM  . gabapentin  100 mg Oral TID  . insulin aspart  0-5 Units Subcutaneous QHS  . insulin aspart  0-9 Units Subcutaneous TID WC  . losartan  100 mg Oral Daily  . methotrexate  10 mg Oral Q Wed  . metoprolol succinate  50 mg Oral Daily  . mupirocin ointment  1 application Nasal BID  . pantoprazole  40 mg Oral Daily  . spironolactone  25 mg Oral Daily   Continuous Infusions: . [START ON 06/03/2019] sodium chloride    .  ceFAZolin (ANCEF) IV       LOS: 1 day    Time spent: 71mins    Kathie Dike, MD [facility]   If 7PM-7AM, please contact night-coverage [email or web address]  06/02/2019, 7:06 PM

## 2019-06-02 NOTE — Consult Note (Signed)
Nina Perez [facility] Consult Note  MRN : [id number]  Nina Perez is a 79 y.o. ([birth year]) female who presents with chief complaint of left foot wound.  History of Present Illness:  The patient is a 79 year old female with multiple medical issues (see below) last seen in our outpatient setting in 2017 for lower extremity edema / venous insufficiency admitted to [facility] at the recommendation of her podiatrist Dr. Elvina Mattes for gangrenous changes to the left foot.  Patient has a known history of peripheral vascular disease with wound formation to the left foot.  Left foot wound has now progressed to gangrenous changes over the last 6 months.  Patient has been under the care of the [place] wound care department as well as Dr. Gwenlyn Found.  Most recent intervention by Dr. Alvester Chou was a left lower extremity angiogram with angioplasty on May 16, 2019.  Follow-up ultrasound completed on May 29, 2018 was notable for triphasic blood flow transitioning to monophasic at the distal SFA.  Patient is currently taking aspirin, Lipitor and Plavix for medical management.  Patient endorses progressively worsening left lower extremity foot pain.  She notes progressive worsening of her wounds stating she now needs an amputation of her toe.  Denies any fever, nausea vomiting.  Denies any shortness of breath or chest pain.  Vascular surgery was consulted by Dr. Elvina Mattes for possible endovascular intervention.  Current Facility-Administered Medications  Medication Dose Route Frequency Provider Last Rate Last Admin  . [START ON 06/03/2019] 0.9 %  sodium chloride infusion   Intravenous Continuous Vinisha Faxon A, PA-C      . [MAR Hold] acetaminophen (TYLENOL) tablet 650 mg  650 mg Oral Q8H PRN Para Skeans, MD   650 mg at 06/01/19 1851  . [MAR Hold] allopurinol (ZYLOPRIM) tablet 300 mg  300 mg Oral Daily Para Skeans, MD      . Doug Sou Hold]  amLODipine (NORVASC) tablet 10 mg  10 mg Oral Daily Para Skeans, MD      . Doug Sou Hold] aspirin EC tablet 81 mg  81 mg Oral Daily Para Skeans, MD      . Doug Sou Hold] atorvastatin (LIPITOR) tablet 80 mg  80 mg Oral q1800 Para Skeans, MD      . Doug Sou Hold] clopidogrel (PLAVIX) tablet 75 mg  75 mg Oral Q breakfast Para Skeans, MD      . Doug Sou Hold] enoxaparin (LOVENOX) injection 40 mg  40 mg Subcutaneous Q24H Para Skeans, MD   40 mg at 06/01/19 2233  . [MAR Hold] folic acid (FOLVITE) tablet 1 mg  1 mg Oral QPM Para Skeans, MD      . Doug Sou Hold] gabapentin (NEURONTIN) capsule 100 mg  100 mg Oral TID Para Skeans, MD   100 mg at 06/01/19 2234  . [MAR Hold] insulin aspart (novoLOG) injection 0-5 Units  0-5 Units Subcutaneous QHS Ouma, Bing Neighbors, NP      . Doug Sou Hold] insulin aspart (novoLOG) injection 0-9 Units  0-9 Units Subcutaneous TID WC Ouma, Bing Neighbors, NP      . Doug Sou Hold] losartan (COZAAR) tablet 100 mg  100 mg Oral Daily Para Skeans, MD      . Doug Sou Hold] methotrexate (RHEUMATREX) tablet 10 mg  10 mg Oral Q Wed Patel, Gretta Cool, MD      . Doug Sou Hold] metoprolol succinate (TOPROL-XL) 24 hr tablet 50 mg  50 mg Oral  COPD   '[]' Asthma Neurologic:  '[]' Dizziness  '[]' Blackouts   '[]' Seizures   '[]' History of stroke   '[]' History of TIA  '[]' Aphasia   '[]' Temporary blindness   '[]' Dysphagia   '[]' Weakness or numbness in arms   '[]' Weakness or numbness in legs Musculoskeletal:  '[]' Arthritis   '[]' Joint swelling   '[]' Joint pain   '[]' Low back pain Hematologic:  '[]' Easy bruising  '[]' Easy bleeding   '[]' Hypercoagulable state   '[]' Anemic  '[]' Hepatitis Gastrointestinal:  '[]' Blood in stool   '[]' Vomiting blood  '[]' Gastroesophageal reflux/heartburn   '[]' Difficulty swallowing. Genitourinary:  '[]' Chronic kidney disease   '[]' Difficult urination  '[]' Frequent urination  '[]' Burning with urination   '[]' Blood in urine Skin:  '[]' Rashes   '[x]' Ulcers   '[x]' Wounds Psychological:  '[]' History of anxiety   '[]'  History of major depression.  Physical Examination  Vitals:   06/01/19 1417 06/01/19 1425 06/02/19 0117 06/02/19 0844  BP: 128/70  123/71 (!) 152/74  Pulse: 69  71 72  Resp: '16  18 18  ' Temp: 98.1 F (36.7 C)  98.7 F (37.1 C) 98.6 F (37 C)  TempSrc: Oral  Oral Oral  SpO2: 97%  92% 91%  Weight:  102 kg    Height:  '5\' 6"'  (1.676 m)     Body mass index is 36.29 kg/m. Gen:  WD/WN, NAD Head: [place]/AT, No temporalis wasting. Prominent temp pulse not noted. Ear/Nose/Throat: Hearing grossly intact, nares w/o erythema or drainage, oropharynx w/o Erythema/Exudate Eyes: Sclera non-icteric, conjunctiva clear Neck: Trachea midline.  No JVD.  Pulmonary:  Good air movement, respirations not labored, equal bilaterally.  Cardiac: RRR, normal S1, S2. Vascular:  Vessel Right Left  Radial Palpable Palpable  Ulnar Palpable Palpable  Brachial Palpable Palpable  Carotid Palpable, without bruit Palpable,  without bruit  Aorta Not palpable N/A  Femoral Palpable Palpable  Popliteal Palpable Palpable  PT Non-Palpable Non-Palpable  DP Non-Palpable Non-Palpable   Left Lower Extremity: Thigh soft.  Calf soft.  Nonpalpable pedal pulses.  Venous stasis changes noted.  Fourth toe with gangrene.    Gastrointestinal: soft, non-tender/non-distended. No guarding/reflex.  Musculoskeletal: M/S 5/5 throughout.  Extremities without ischemic changes.  No deformity or atrophy. Moderate edema. Neurologic: Sensation grossly intact in extremities.  Symmetrical.  Speech is fluent. Motor exam as listed above. Psychiatric: Judgment intact, Mood & affect appropriate for pt's clinical situation. Dermatologic: As above Lymph : No Cervical, Axillary, or Inguinal lymphadenopathy.  CBC Lab Results  Component Value Date   WBC 5.2 06/02/2019   HGB 11.7 (L) 06/02/2019   HCT 37.0 06/02/2019   MCV 97.6 06/02/2019   PLT 132 (L) 06/02/2019   BMET    Component Value Date/Time   NA 136 06/02/2019 0432   NA 138 04/29/2019 0917   K 4.3 06/02/2019 0432   CL 103 06/02/2019 0432   CO2 26 06/02/2019 0432   GLUCOSE 82 06/02/2019 0432   BUN 22 06/02/2019 0432   BUN 19 04/29/2019 0917   CREATININE 1.00 06/02/2019 0432   CALCIUM 9.9 06/02/2019 0432   GFRNONAA 54 (L) 06/02/2019 0432   GFRAA >60 06/02/2019 0432   Estimated Creatinine Clearance: 55.9 mL/min (by C-G formula based on SCr of 1 mg/dL).  COAG Lab Results  Component Value Date   INR 1.1 06/02/2019   Radiology PERIPHERAL VASCULAR CATHETERIZATION  Result Date: 05/16/2019  [id number] LOCATION:  FACILITY: [facility] PHYSICIAN: Quay Burow, M.D. 1941-02-05 DATE OF PROCEDURE:  05/16/2019 DATE OF DISCHARGE: PV Angiogram/Intervention History obtained from chart review.ANNALYCE LANPHER is a 79 y.o.  moderately overweight  COPD   '[]' Asthma Neurologic:  '[]' Dizziness  '[]' Blackouts   '[]' Seizures   '[]' History of stroke   '[]' History of TIA  '[]' Aphasia   '[]' Temporary blindness   '[]' Dysphagia   '[]' Weakness or numbness in arms   '[]' Weakness or numbness in legs Musculoskeletal:  '[]' Arthritis   '[]' Joint swelling   '[]' Joint pain   '[]' Low back pain Hematologic:  '[]' Easy bruising  '[]' Easy bleeding   '[]' Hypercoagulable state   '[]' Anemic  '[]' Hepatitis Gastrointestinal:  '[]' Blood in stool   '[]' Vomiting blood  '[]' Gastroesophageal reflux/heartburn   '[]' Difficulty swallowing. Genitourinary:  '[]' Chronic kidney disease   '[]' Difficult urination  '[]' Frequent urination  '[]' Burning with urination   '[]' Blood in urine Skin:  '[]' Rashes   '[x]' Ulcers   '[x]' Wounds Psychological:  '[]' History of anxiety   '[]'  History of major depression.  Physical Examination  Vitals:   06/01/19 1417 06/01/19 1425 06/02/19 0117 06/02/19 0844  BP: 128/70  123/71 (!) 152/74  Pulse: 69  71 72  Resp: '16  18 18  ' Temp: 98.1 F (36.7 C)  98.7 F (37.1 C) 98.6 F (37 C)  TempSrc: Oral  Oral Oral  SpO2: 97%  92% 91%  Weight:  102 kg    Height:  '5\' 6"'  (1.676 m)     Body mass index is 36.29 kg/m. Gen:  WD/WN, NAD Head: [place]/AT, No temporalis wasting. Prominent temp pulse not noted. Ear/Nose/Throat: Hearing grossly intact, nares w/o erythema or drainage, oropharynx w/o Erythema/Exudate Eyes: Sclera non-icteric, conjunctiva clear Neck: Trachea midline.  No JVD.  Pulmonary:  Good air movement, respirations not labored, equal bilaterally.  Cardiac: RRR, normal S1, S2. Vascular:  Vessel Right Left  Radial Palpable Palpable  Ulnar Palpable Palpable  Brachial Palpable Palpable  Carotid Palpable, without bruit Palpable,  without bruit  Aorta Not palpable N/A  Femoral Palpable Palpable  Popliteal Palpable Palpable  PT Non-Palpable Non-Palpable  DP Non-Palpable Non-Palpable   Left Lower Extremity: Thigh soft.  Calf soft.  Nonpalpable pedal pulses.  Venous stasis changes noted.  Fourth toe with gangrene.    Gastrointestinal: soft, non-tender/non-distended. No guarding/reflex.  Musculoskeletal: M/S 5/5 throughout.  Extremities without ischemic changes.  No deformity or atrophy. Moderate edema. Neurologic: Sensation grossly intact in extremities.  Symmetrical.  Speech is fluent. Motor exam as listed above. Psychiatric: Judgment intact, Mood & affect appropriate for pt's clinical situation. Dermatologic: As above Lymph : No Cervical, Axillary, or Inguinal lymphadenopathy.  CBC Lab Results  Component Value Date   WBC 5.2 06/02/2019   HGB 11.7 (L) 06/02/2019   HCT 37.0 06/02/2019   MCV 97.6 06/02/2019   PLT 132 (L) 06/02/2019   BMET    Component Value Date/Time   NA 136 06/02/2019 0432   NA 138 04/29/2019 0917   K 4.3 06/02/2019 0432   CL 103 06/02/2019 0432   CO2 26 06/02/2019 0432   GLUCOSE 82 06/02/2019 0432   BUN 22 06/02/2019 0432   BUN 19 04/29/2019 0917   CREATININE 1.00 06/02/2019 0432   CALCIUM 9.9 06/02/2019 0432   GFRNONAA 54 (L) 06/02/2019 0432   GFRAA >60 06/02/2019 0432   Estimated Creatinine Clearance: 55.9 mL/min (by C-G formula based on SCr of 1 mg/dL).  COAG Lab Results  Component Value Date   INR 1.1 06/02/2019   Radiology PERIPHERAL VASCULAR CATHETERIZATION  Result Date: 05/16/2019  [id number] LOCATION:  FACILITY: [facility] PHYSICIAN: Quay Burow, M.D. 1941-02-05 DATE OF PROCEDURE:  05/16/2019 DATE OF DISCHARGE: PV Angiogram/Intervention History obtained from chart review.ANNALYCE LANPHER is a 79 y.o.  moderately overweight  COPD   '[]' Asthma Neurologic:  '[]' Dizziness  '[]' Blackouts   '[]' Seizures   '[]' History of stroke   '[]' History of TIA  '[]' Aphasia   '[]' Temporary blindness   '[]' Dysphagia   '[]' Weakness or numbness in arms   '[]' Weakness or numbness in legs Musculoskeletal:  '[]' Arthritis   '[]' Joint swelling   '[]' Joint pain   '[]' Low back pain Hematologic:  '[]' Easy bruising  '[]' Easy bleeding   '[]' Hypercoagulable state   '[]' Anemic  '[]' Hepatitis Gastrointestinal:  '[]' Blood in stool   '[]' Vomiting blood  '[]' Gastroesophageal reflux/heartburn   '[]' Difficulty swallowing. Genitourinary:  '[]' Chronic kidney disease   '[]' Difficult urination  '[]' Frequent urination  '[]' Burning with urination   '[]' Blood in urine Skin:  '[]' Rashes   '[x]' Ulcers   '[x]' Wounds Psychological:  '[]' History of anxiety   '[]'  History of major depression.  Physical Examination  Vitals:   06/01/19 1417 06/01/19 1425 06/02/19 0117 06/02/19 0844  BP: 128/70  123/71 (!) 152/74  Pulse: 69  71 72  Resp: '16  18 18  ' Temp: 98.1 F (36.7 C)  98.7 F (37.1 C) 98.6 F (37 C)  TempSrc: Oral  Oral Oral  SpO2: 97%  92% 91%  Weight:  102 kg    Height:  '5\' 6"'  (1.676 m)     Body mass index is 36.29 kg/m. Gen:  WD/WN, NAD Head: [place]/AT, No temporalis wasting. Prominent temp pulse not noted. Ear/Nose/Throat: Hearing grossly intact, nares w/o erythema or drainage, oropharynx w/o Erythema/Exudate Eyes: Sclera non-icteric, conjunctiva clear Neck: Trachea midline.  No JVD.  Pulmonary:  Good air movement, respirations not labored, equal bilaterally.  Cardiac: RRR, normal S1, S2. Vascular:  Vessel Right Left  Radial Palpable Palpable  Ulnar Palpable Palpable  Brachial Palpable Palpable  Carotid Palpable, without bruit Palpable,  without bruit  Aorta Not palpable N/A  Femoral Palpable Palpable  Popliteal Palpable Palpable  PT Non-Palpable Non-Palpable  DP Non-Palpable Non-Palpable   Left Lower Extremity: Thigh soft.  Calf soft.  Nonpalpable pedal pulses.  Venous stasis changes noted.  Fourth toe with gangrene.    Gastrointestinal: soft, non-tender/non-distended. No guarding/reflex.  Musculoskeletal: M/S 5/5 throughout.  Extremities without ischemic changes.  No deformity or atrophy. Moderate edema. Neurologic: Sensation grossly intact in extremities.  Symmetrical.  Speech is fluent. Motor exam as listed above. Psychiatric: Judgment intact, Mood & affect appropriate for pt's clinical situation. Dermatologic: As above Lymph : No Cervical, Axillary, or Inguinal lymphadenopathy.  CBC Lab Results  Component Value Date   WBC 5.2 06/02/2019   HGB 11.7 (L) 06/02/2019   HCT 37.0 06/02/2019   MCV 97.6 06/02/2019   PLT 132 (L) 06/02/2019   BMET    Component Value Date/Time   NA 136 06/02/2019 0432   NA 138 04/29/2019 0917   K 4.3 06/02/2019 0432   CL 103 06/02/2019 0432   CO2 26 06/02/2019 0432   GLUCOSE 82 06/02/2019 0432   BUN 22 06/02/2019 0432   BUN 19 04/29/2019 0917   CREATININE 1.00 06/02/2019 0432   CALCIUM 9.9 06/02/2019 0432   GFRNONAA 54 (L) 06/02/2019 0432   GFRAA >60 06/02/2019 0432   Estimated Creatinine Clearance: 55.9 mL/min (by C-G formula based on SCr of 1 mg/dL).  COAG Lab Results  Component Value Date   INR 1.1 06/02/2019   Radiology PERIPHERAL VASCULAR CATHETERIZATION  Result Date: 05/16/2019  [id number] LOCATION:  FACILITY: [facility] PHYSICIAN: Quay Burow, M.D. 1941-02-05 DATE OF PROCEDURE:  05/16/2019 DATE OF DISCHARGE: PV Angiogram/Intervention History obtained from chart review.ANNALYCE LANPHER is a 79 y.o.  moderately overweight  Nina Perez Vascular Consult Note  MRN : [id number]  Nina Perez is a 79 y.o. ([birth year]) female who presents with chief complaint of left foot wound.  History of Present Illness:  The patient is a 79 year old female with multiple medical issues (see below) last seen in our outpatient setting in 2017 for lower extremity edema / venous insufficiency admitted to [facility] at the recommendation of her podiatrist Dr. Elvina Mattes for gangrenous changes to the left foot.  Patient has a known history of peripheral vascular disease with wound formation to the left foot.  Left foot wound has now progressed to gangrenous changes over the last 6 months.  Patient has been under the care of the [place] wound care department as well as Dr. Gwenlyn Found.  Most recent intervention by Dr. Alvester Chou was a left lower extremity angiogram with angioplasty on May 16, 2019.  Follow-up ultrasound completed on May 29, 2018 was notable for triphasic blood flow transitioning to monophasic at the distal SFA.  Patient is currently taking aspirin, Lipitor and Plavix for medical management.  Patient endorses progressively worsening left lower extremity foot pain.  She notes progressive worsening of her wounds stating she now needs an amputation of her toe.  Denies any fever, nausea vomiting.  Denies any shortness of breath or chest pain.  Vascular surgery was consulted by Dr. Elvina Mattes for possible endovascular intervention.  Current Facility-Administered Medications  Medication Dose Route Frequency Provider Last Rate Last Admin  . [START ON 06/03/2019] 0.9 %  sodium chloride infusion   Intravenous Continuous Vinisha Faxon A, PA-C      . [MAR Hold] acetaminophen (TYLENOL) tablet 650 mg  650 mg Oral Q8H PRN Para Skeans, MD   650 mg at 06/01/19 1851  . [MAR Hold] allopurinol (ZYLOPRIM) tablet 300 mg  300 mg Oral Daily Para Skeans, MD      . Doug Sou Hold]  amLODipine (NORVASC) tablet 10 mg  10 mg Oral Daily Para Skeans, MD      . Doug Sou Hold] aspirin EC tablet 81 mg  81 mg Oral Daily Para Skeans, MD      . Doug Sou Hold] atorvastatin (LIPITOR) tablet 80 mg  80 mg Oral q1800 Para Skeans, MD      . Doug Sou Hold] clopidogrel (PLAVIX) tablet 75 mg  75 mg Oral Q breakfast Para Skeans, MD      . Doug Sou Hold] enoxaparin (LOVENOX) injection 40 mg  40 mg Subcutaneous Q24H Para Skeans, MD   40 mg at 06/01/19 2233  . [MAR Hold] folic acid (FOLVITE) tablet 1 mg  1 mg Oral QPM Para Skeans, MD      . Doug Sou Hold] gabapentin (NEURONTIN) capsule 100 mg  100 mg Oral TID Para Skeans, MD   100 mg at 06/01/19 2234  . [MAR Hold] insulin aspart (novoLOG) injection 0-5 Units  0-5 Units Subcutaneous QHS Ouma, Bing Neighbors, NP      . Doug Sou Hold] insulin aspart (novoLOG) injection 0-9 Units  0-9 Units Subcutaneous TID WC Ouma, Bing Neighbors, NP      . Doug Sou Hold] losartan (COZAAR) tablet 100 mg  100 mg Oral Daily Para Skeans, MD      . Doug Sou Hold] methotrexate (RHEUMATREX) tablet 10 mg  10 mg Oral Q Wed Patel, Gretta Cool, MD      . Doug Sou Hold] metoprolol succinate (TOPROL-XL) 24 hr tablet 50 mg  50 mg Oral  Nina Perez Vascular Consult Note  MRN : [id number]  Nina Perez is a 79 y.o. ([birth year]) female who presents with chief complaint of left foot wound.  History of Present Illness:  The patient is a 79 year old female with multiple medical issues (see below) last seen in our outpatient setting in 2017 for lower extremity edema / venous insufficiency admitted to [facility] at the recommendation of her podiatrist Dr. Elvina Mattes for gangrenous changes to the left foot.  Patient has a known history of peripheral vascular disease with wound formation to the left foot.  Left foot wound has now progressed to gangrenous changes over the last 6 months.  Patient has been under the care of the [place] wound care department as well as Dr. Gwenlyn Found.  Most recent intervention by Dr. Alvester Chou was a left lower extremity angiogram with angioplasty on May 16, 2019.  Follow-up ultrasound completed on May 29, 2018 was notable for triphasic blood flow transitioning to monophasic at the distal SFA.  Patient is currently taking aspirin, Lipitor and Plavix for medical management.  Patient endorses progressively worsening left lower extremity foot pain.  She notes progressive worsening of her wounds stating she now needs an amputation of her toe.  Denies any fever, nausea vomiting.  Denies any shortness of breath or chest pain.  Vascular surgery was consulted by Dr. Elvina Mattes for possible endovascular intervention.  Current Facility-Administered Medications  Medication Dose Route Frequency Provider Last Rate Last Admin  . [START ON 06/03/2019] 0.9 %  sodium chloride infusion   Intravenous Continuous Vinisha Faxon A, PA-C      . [MAR Hold] acetaminophen (TYLENOL) tablet 650 mg  650 mg Oral Q8H PRN Para Skeans, MD   650 mg at 06/01/19 1851  . [MAR Hold] allopurinol (ZYLOPRIM) tablet 300 mg  300 mg Oral Daily Para Skeans, MD      . Doug Sou Hold]  amLODipine (NORVASC) tablet 10 mg  10 mg Oral Daily Para Skeans, MD      . Doug Sou Hold] aspirin EC tablet 81 mg  81 mg Oral Daily Para Skeans, MD      . Doug Sou Hold] atorvastatin (LIPITOR) tablet 80 mg  80 mg Oral q1800 Para Skeans, MD      . Doug Sou Hold] clopidogrel (PLAVIX) tablet 75 mg  75 mg Oral Q breakfast Para Skeans, MD      . Doug Sou Hold] enoxaparin (LOVENOX) injection 40 mg  40 mg Subcutaneous Q24H Para Skeans, MD   40 mg at 06/01/19 2233  . [MAR Hold] folic acid (FOLVITE) tablet 1 mg  1 mg Oral QPM Para Skeans, MD      . Doug Sou Hold] gabapentin (NEURONTIN) capsule 100 mg  100 mg Oral TID Para Skeans, MD   100 mg at 06/01/19 2234  . [MAR Hold] insulin aspart (novoLOG) injection 0-5 Units  0-5 Units Subcutaneous QHS Ouma, Bing Neighbors, NP      . Doug Sou Hold] insulin aspart (novoLOG) injection 0-9 Units  0-9 Units Subcutaneous TID WC Ouma, Bing Neighbors, NP      . Doug Sou Hold] losartan (COZAAR) tablet 100 mg  100 mg Oral Daily Para Skeans, MD      . Doug Sou Hold] methotrexate (RHEUMATREX) tablet 10 mg  10 mg Oral Q Wed Patel, Gretta Cool, MD      . Doug Sou Hold] metoprolol succinate (TOPROL-XL) 24 hr tablet 50 mg  50 mg Oral  Nina Perez Vascular Consult Note  MRN : [id number]  Nina Perez is a 79 y.o. ([birth year]) female who presents with chief complaint of left foot wound.  History of Present Illness:  The patient is a 79 year old female with multiple medical issues (see below) last seen in our outpatient setting in 2017 for lower extremity edema / venous insufficiency admitted to [facility] at the recommendation of her podiatrist Dr. Elvina Mattes for gangrenous changes to the left foot.  Patient has a known history of peripheral vascular disease with wound formation to the left foot.  Left foot wound has now progressed to gangrenous changes over the last 6 months.  Patient has been under the care of the [place] wound care department as well as Dr. Gwenlyn Found.  Most recent intervention by Dr. Alvester Chou was a left lower extremity angiogram with angioplasty on May 16, 2019.  Follow-up ultrasound completed on May 29, 2018 was notable for triphasic blood flow transitioning to monophasic at the distal SFA.  Patient is currently taking aspirin, Lipitor and Plavix for medical management.  Patient endorses progressively worsening left lower extremity foot pain.  She notes progressive worsening of her wounds stating she now needs an amputation of her toe.  Denies any fever, nausea vomiting.  Denies any shortness of breath or chest pain.  Vascular surgery was consulted by Dr. Elvina Mattes for possible endovascular intervention.  Current Facility-Administered Medications  Medication Dose Route Frequency Provider Last Rate Last Admin  . [START ON 06/03/2019] 0.9 %  sodium chloride infusion   Intravenous Continuous Vinisha Faxon A, PA-C      . [MAR Hold] acetaminophen (TYLENOL) tablet 650 mg  650 mg Oral Q8H PRN Para Skeans, MD   650 mg at 06/01/19 1851  . [MAR Hold] allopurinol (ZYLOPRIM) tablet 300 mg  300 mg Oral Daily Para Skeans, MD      . Doug Sou Hold]  amLODipine (NORVASC) tablet 10 mg  10 mg Oral Daily Para Skeans, MD      . Doug Sou Hold] aspirin EC tablet 81 mg  81 mg Oral Daily Para Skeans, MD      . Doug Sou Hold] atorvastatin (LIPITOR) tablet 80 mg  80 mg Oral q1800 Para Skeans, MD      . Doug Sou Hold] clopidogrel (PLAVIX) tablet 75 mg  75 mg Oral Q breakfast Para Skeans, MD      . Doug Sou Hold] enoxaparin (LOVENOX) injection 40 mg  40 mg Subcutaneous Q24H Para Skeans, MD   40 mg at 06/01/19 2233  . [MAR Hold] folic acid (FOLVITE) tablet 1 mg  1 mg Oral QPM Para Skeans, MD      . Doug Sou Hold] gabapentin (NEURONTIN) capsule 100 mg  100 mg Oral TID Para Skeans, MD   100 mg at 06/01/19 2234  . [MAR Hold] insulin aspart (novoLOG) injection 0-5 Units  0-5 Units Subcutaneous QHS Ouma, Bing Neighbors, NP      . Doug Sou Hold] insulin aspart (novoLOG) injection 0-9 Units  0-9 Units Subcutaneous TID WC Ouma, Bing Neighbors, NP      . Doug Sou Hold] losartan (COZAAR) tablet 100 mg  100 mg Oral Daily Para Skeans, MD      . Doug Sou Hold] methotrexate (RHEUMATREX) tablet 10 mg  10 mg Oral Q Wed Patel, Gretta Cool, MD      . Doug Sou Hold] metoprolol succinate (TOPROL-XL) 24 hr tablet 50 mg  50 mg Oral  Nina Perez Vascular Consult Note  MRN : [id number]  Nina Perez is a 79 y.o. ([birth year]) female who presents with chief complaint of left foot wound.  History of Present Illness:  The patient is a 79 year old female with multiple medical issues (see below) last seen in our outpatient setting in 2017 for lower extremity edema / venous insufficiency admitted to [facility] at the recommendation of her podiatrist Dr. Elvina Mattes for gangrenous changes to the left foot.  Patient has a known history of peripheral vascular disease with wound formation to the left foot.  Left foot wound has now progressed to gangrenous changes over the last 6 months.  Patient has been under the care of the [place] wound care department as well as Dr. Gwenlyn Found.  Most recent intervention by Dr. Alvester Chou was a left lower extremity angiogram with angioplasty on May 16, 2019.  Follow-up ultrasound completed on May 29, 2018 was notable for triphasic blood flow transitioning to monophasic at the distal SFA.  Patient is currently taking aspirin, Lipitor and Plavix for medical management.  Patient endorses progressively worsening left lower extremity foot pain.  She notes progressive worsening of her wounds stating she now needs an amputation of her toe.  Denies any fever, nausea vomiting.  Denies any shortness of breath or chest pain.  Vascular surgery was consulted by Dr. Elvina Mattes for possible endovascular intervention.  Current Facility-Administered Medications  Medication Dose Route Frequency Provider Last Rate Last Admin  . [START ON 06/03/2019] 0.9 %  sodium chloride infusion   Intravenous Continuous Vinisha Faxon A, PA-C      . [MAR Hold] acetaminophen (TYLENOL) tablet 650 mg  650 mg Oral Q8H PRN Para Skeans, MD   650 mg at 06/01/19 1851  . [MAR Hold] allopurinol (ZYLOPRIM) tablet 300 mg  300 mg Oral Daily Para Skeans, MD      . Doug Sou Hold]  amLODipine (NORVASC) tablet 10 mg  10 mg Oral Daily Para Skeans, MD      . Doug Sou Hold] aspirin EC tablet 81 mg  81 mg Oral Daily Para Skeans, MD      . Doug Sou Hold] atorvastatin (LIPITOR) tablet 80 mg  80 mg Oral q1800 Para Skeans, MD      . Doug Sou Hold] clopidogrel (PLAVIX) tablet 75 mg  75 mg Oral Q breakfast Para Skeans, MD      . Doug Sou Hold] enoxaparin (LOVENOX) injection 40 mg  40 mg Subcutaneous Q24H Para Skeans, MD   40 mg at 06/01/19 2233  . [MAR Hold] folic acid (FOLVITE) tablet 1 mg  1 mg Oral QPM Para Skeans, MD      . Doug Sou Hold] gabapentin (NEURONTIN) capsule 100 mg  100 mg Oral TID Para Skeans, MD   100 mg at 06/01/19 2234  . [MAR Hold] insulin aspart (novoLOG) injection 0-5 Units  0-5 Units Subcutaneous QHS Ouma, Bing Neighbors, NP      . Doug Sou Hold] insulin aspart (novoLOG) injection 0-9 Units  0-9 Units Subcutaneous TID WC Ouma, Bing Neighbors, NP      . Doug Sou Hold] losartan (COZAAR) tablet 100 mg  100 mg Oral Daily Para Skeans, MD      . Doug Sou Hold] methotrexate (RHEUMATREX) tablet 10 mg  10 mg Oral Q Wed Patel, Gretta Cool, MD      . Doug Sou Hold] metoprolol succinate (TOPROL-XL) 24 hr tablet 50 mg  50 mg Oral

## 2019-06-03 ENCOUNTER — Encounter
Admission: AD | Disposition: A | Discharge status: Home Health | Payer: Self-pay | Source: Home / Self Care | Attending: Internal Medicine

## 2019-06-03 DIAGNOSIS — I70244 Atherosclerosis of native arteries of left leg with ulceration of heel and midfoot: Secondary | ICD-10-CM

## 2019-06-03 HISTORY — PX: LOWER EXTREMITY ANGIOGRAPHY: CATH118251

## 2019-06-03 LAB — BASIC METABOLIC PANEL
Anion gap: 8 (ref 5–15)
BUN: 19 mg/dL (ref 8–23)
CO2: 28 mmol/L (ref 22–32)
Calcium: 9.8 mg/dL (ref 8.9–10.3)
Chloride: 101 mmol/L (ref 98–111)
Creatinine, Ser: 1.1 mg/dL — ABNORMAL HIGH (ref 0.44–1.00)
GFR calc Af Amer: 56 mL/min — ABNORMAL LOW (ref >60)
GFR calc non Af Amer: 48 mL/min — ABNORMAL LOW (ref >60)
Glucose, Bld: 82 mg/dL (ref 70–99)
Potassium: 4.9 mmol/L (ref 3.5–5.1)
Sodium: 137 mmol/L (ref 135–145)

## 2019-06-03 LAB — GLUCOSE, CAPILLARY
Glucose-Capillary: 121 mg/dL — ABNORMAL HIGH (ref 70–99)
Glucose-Capillary: 141 mg/dL — ABNORMAL HIGH (ref 70–99)
Glucose-Capillary: 77 mg/dL (ref 70–99)
Glucose-Capillary: 78 mg/dL (ref 70–99)
Glucose-Capillary: 93 mg/dL (ref 70–99)
Glucose-Capillary: 96 mg/dL (ref 70–99)
Glucose-Capillary: 99 mg/dL (ref 70–99)

## 2019-06-03 LAB — CBC
HCT: 39.7 % (ref 36.0–46.0)
Hemoglobin: 12.6 g/dL (ref 12.0–15.0)
MCH: 31.2 pg (ref 26.0–34.0)
MCHC: 31.7 g/dL (ref 30.0–36.0)
MCV: 98.3 fL (ref 80.0–100.0)
Platelets: 120 10*3/uL — ABNORMAL LOW (ref 150–400)
RBC: 4.04 MIL/uL (ref 3.87–5.11)
RDW: 17.6 % — ABNORMAL HIGH (ref 11.5–15.5)
WBC: 6.6 10*3/uL (ref 4.0–10.5)
nRBC: 0 % (ref 0.0–0.2)

## 2019-06-03 LAB — MAGNESIUM: Magnesium: 1.4 mg/dL — ABNORMAL LOW (ref 1.7–2.4)

## 2019-06-03 LAB — SURGICAL PATHOLOGY

## 2019-06-03 SURGERY — LOWER EXTREMITY ANGIOGRAPHY
Anesthesia: Moderate Sedation | Laterality: Left

## 2019-06-03 MED ORDER — SODIUM CHLORIDE 0.9% FLUSH
3.0000 mL | INTRAVENOUS | Status: DC | PRN
  Administered 2019-06-06: 3 mL via INTRAVENOUS

## 2019-06-03 MED ORDER — DEXTROSE 50 % IV SOLN: 1.0000 | Freq: Once | INTRAVENOUS | Status: DC

## 2019-06-03 MED ORDER — HEPARIN SODIUM (PORCINE) 1000 UNIT/ML IJ SOLN
INTRAMUSCULAR | Status: AC
  Filled 2019-06-03: qty 1

## 2019-06-03 MED ORDER — LABETALOL HCL 5 MG/ML IV SOLN: 10.0000 mg | INTRAVENOUS | Status: DC | PRN

## 2019-06-03 MED ORDER — ACETAMINOPHEN 325 MG PO TABS: 650.0000 mg | ORAL_TABLET | ORAL | Status: DC | PRN

## 2019-06-03 MED ORDER — ONDANSETRON HCL 4 MG/2ML IJ SOLN: 4.0000 mg | Freq: Four times a day (QID) | INTRAMUSCULAR | Status: DC | PRN

## 2019-06-03 MED ORDER — DEXTROSE 50 % IV SOLN
12.5000 g | INTRAVENOUS | Status: AC
  Administered 2019-06-03: 12.5 g via INTRAVENOUS
  Filled 2019-06-03: qty 50

## 2019-06-03 MED ORDER — MIDAZOLAM HCL 2 MG/2ML IJ SOLN
INTRAMUSCULAR | Status: DC | PRN
  Administered 2019-06-03 (×2): 0.5 mg via INTRAVENOUS
  Administered 2019-06-03: 1 mg via INTRAVENOUS

## 2019-06-03 MED ORDER — MIDAZOLAM HCL 5 MG/5ML IJ SOLN
INTRAMUSCULAR | Status: AC
  Filled 2019-06-03: qty 5

## 2019-06-03 MED ORDER — FENTANYL CITRATE (PF) 100 MCG/2ML IJ SOLN
INTRAMUSCULAR | Status: AC
  Filled 2019-06-03: qty 2

## 2019-06-03 MED ORDER — OXYCODONE HCL 5 MG PO TABS
5.0000 mg | ORAL_TABLET | ORAL | Status: DC | PRN
  Administered 2019-06-04 (×2): 5 mg via ORAL
  Administered 2019-06-04 – 2019-06-06 (×4): 10 mg via ORAL
  Administered 2019-06-06: 5 mg via ORAL
  Administered 2019-06-07 – 2019-06-08 (×2): 10 mg via ORAL
  Administered 2019-06-08: 5 mg via ORAL
  Administered 2019-06-09 (×4): 10 mg via ORAL
  Filled 2019-06-03 (×2): qty 2
  Filled 2019-06-03: qty 1
  Filled 2019-06-03: qty 2
  Filled 2019-06-03: qty 1
  Filled 2019-06-03: qty 2
  Filled 2019-06-03: qty 1
  Filled 2019-06-03 (×2): qty 2
  Filled 2019-06-03: qty 1
  Filled 2019-06-03 (×5): qty 2

## 2019-06-03 MED ORDER — SODIUM CHLORIDE 0.9 % IV SOLN: INTRAVENOUS | Status: DC

## 2019-06-03 MED ORDER — SODIUM CHLORIDE 0.9 % IV SOLN: INTRAVENOUS | Status: AC

## 2019-06-03 MED ORDER — SODIUM CHLORIDE 0.9 % IV SOLN
250.0000 mL | INTRAVENOUS | Status: DC | PRN
  Administered 2019-06-04 – 2019-06-05 (×4): 250 mL via INTRAVENOUS

## 2019-06-03 MED ORDER — HEPARIN SODIUM (PORCINE) 1000 UNIT/ML IJ SOLN
INTRAMUSCULAR | Status: DC | PRN
  Administered 2019-06-03: 4000 [IU] via INTRAVENOUS

## 2019-06-03 MED ORDER — FENTANYL CITRATE (PF) 100 MCG/2ML IJ SOLN
INTRAMUSCULAR | Status: DC | PRN
  Administered 2019-06-03: 25 ug via INTRAVENOUS
  Administered 2019-06-03: 50 ug via INTRAVENOUS
  Administered 2019-06-03: 25 ug via INTRAVENOUS

## 2019-06-03 MED ORDER — SODIUM CHLORIDE 0.9% FLUSH
3.0000 mL | Freq: Two times a day (BID) | INTRAVENOUS | Status: DC
  Administered 2019-06-03 – 2019-06-09 (×9): 3 mL via INTRAVENOUS

## 2019-06-03 MED ORDER — MIDAZOLAM HCL 2 MG/ML PO SYRP: 8.0000 mg | ORAL_SOLUTION | Freq: Once | ORAL | Status: DC | PRN

## 2019-06-03 MED ORDER — HYDRALAZINE HCL 20 MG/ML IJ SOLN
5.0000 mg | INTRAMUSCULAR | Status: DC | PRN
  Filled 2019-06-03: qty 0.25

## 2019-06-03 MED ORDER — IODIXANOL 320 MG/ML IV SOLN
INTRAVENOUS | Status: DC | PRN
  Administered 2019-06-03: 80 mL via INTRA_ARTERIAL

## 2019-06-03 MED ORDER — DIPHENHYDRAMINE HCL 50 MG/ML IJ SOLN: 50.0000 mg | Freq: Once | INTRAMUSCULAR | Status: DC | PRN

## 2019-06-03 MED ORDER — METHYLPREDNISOLONE SODIUM SUCC 125 MG IJ SOLR: 125.0000 mg | Freq: Once | INTRAMUSCULAR | Status: DC | PRN

## 2019-06-03 MED ORDER — FAMOTIDINE 20 MG PO TABS: 40.0000 mg | ORAL_TABLET | Freq: Once | ORAL | Status: DC | PRN

## 2019-06-03 MED ORDER — MORPHINE SULFATE (PF) 2 MG/ML IV SOLN
2.0000 mg | INTRAVENOUS | Status: DC | PRN
  Administered 2019-06-03 (×3): 2 mg via INTRAVENOUS
  Filled 2019-06-03 (×2): qty 1

## 2019-06-03 MED ORDER — MAGNESIUM SULFATE IN D5W 1-5 GM/100ML-% IV SOLN
1.0000 g | Freq: Once | INTRAVENOUS | Status: AC
  Administered 2019-06-03: 1 g via INTRAVENOUS
  Filled 2019-06-03: qty 100

## 2019-06-03 SURGICAL SUPPLY — 18 items
BALLN ULTRVRSE 3X300X150 (BALLOONS) ×3
BALLOON ULTRVRSE 3X300X150 (BALLOONS) IMPLANT
CATH PIG 70CM (CATHETERS) ×2 IMPLANT
CATH SEEKER .035X135CM (CATHETERS) ×2 IMPLANT
DEVICE PRESTO INFLATION (MISCELLANEOUS) ×2 IMPLANT
DEVICE STARCLOSE SE CLOSURE (Vascular Products) ×2 IMPLANT
GLIDEWIRE ADV .035X260CM (WIRE) ×2 IMPLANT
NDL ENTRY 21GA 7CM ECHOTIP (NEEDLE) IMPLANT
NEEDLE ENTRY 21GA 7CM ECHOTIP (NEEDLE) ×3 IMPLANT
PACK ANGIOGRAPHY (CUSTOM PROCEDURE TRAY) ×3 IMPLANT
SET INTRO CAPELLA COAXIAL (SET/KITS/TRAYS/PACK) ×2 IMPLANT
SHEATH BRITE TIP 5FRX11 (SHEATH) ×2 IMPLANT
SHEATH RAABE 6FR (SHEATH) ×2 IMPLANT
SYR MEDRAD MARK 7 150ML (SYRINGE) ×2 IMPLANT
TOWEL OR 17X26 4PK STRL BLUE (TOWEL DISPOSABLE) ×2 IMPLANT
TUBING CONTRAST HIGH PRESS 72 (TUBING) ×2 IMPLANT
WIRE J 3MM .035X145CM (WIRE) ×2 IMPLANT
WIRE RUNTHROUGH .014X300CM (WIRE) ×2 IMPLANT

## 2019-06-03 NOTE — Progress Notes (Signed)
[facility] Podiatry                                                      Patient Demographics  Nina Perez, is a 79 y.o. female   MRN: [id number]   DOB - 07-13-1940  Admit Date - 06/01/2019    Outpatient Primary MD for the patient is Einar Pheasant, MD  Consult requested in the Hospital by Para Skeans, MD, On 06/03/2019    Past Medical History:  Diagnosis Date  . Barrett's esophagus   . Cancer ([place])    skin ca  . Diabetes mellitus ([place])   . Gout   . History of colon cancer    adenomatous polyps  . Hypercholesterolemia   . Hypertension   . Inflammatory arthritis   . Osteoarthritis    knees, spine  . Phlebitis    x2 (with pregnancy)  . 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 Pulmonary hypertension ([place])   . Reflux esophagitis   . Rheumatoid arthritis(714.0)    positive RF, FANA, RNP, negative CCP ab and anti DNA,.  neg anti-SCL 70  . 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 ABDOMINAL AORTOGRAM W/LOWER EXTREMITY Left 05/16/2019   Procedure: ABDOMINAL AORTOGRAM W/LOWER EXTREMITY;  Surgeon: Lorretta Harp, MD;  Location: [place];  Service: Cardiovascular;  Laterality: Left;  . BREAST CYST ASPIRATION Left    neg  . PERIPHERAL VASCULAR BALLOON ANGIOPLASTY Left 05/16/2019   Procedure: PERIPHERAL VASCULAR BALLOON ANGIOPLASTY;  Surgeon: Lorretta Harp, MD;  Location: [facility];  Service: Cardiovascular;  Laterality: Left;  SFA  . VEIN LIGATION AND STRIPPING      in for   No chief complaint on file.    HPI  Nina Perez  is a 79 y.o. female, she is 1 day status post amputation fourth toe left foot secondary to gangrene.  She had a history of diabetic peripheral vascular disease she is scheduled for a repeat angioplasty today.  She had this done first  part of January but went on to progress and have a gangrenous fourth toe.    Review of Systems: Patient is alert well oriented pleasant this morning states she is not having too much in the way of pain.  In addition to the HPI above,  No Fever-chills, No Headache, No changes with Vision or hearing, No problems swallowing food or Liquids, No Chest pain, Cough or Shortness of Breath, No Abdominal pain, No Nausea or Vommitting, Bowel movements are regular, No Blood in stool or Urine, No dysuria, No new skin rashes or bruises, No new joints pains-aches,  No new weakness, tingling, numbness in any extremity, No recent weight gain or loss, No polyuria, polydypsia or polyphagia, No significant Mental Stressors.  A full 10 point Review of Systems was done, except as stated above, all other Review of Systems were negative.   Social History Social History   Tobacco Use  . Smoking status: Former Smoker    Packs/day: 0.50    Years: 15.00    Pack years: 7.50    Quit date: 05/12/1988    Years since quitting: 31.0  . Smokeless tobacco: Never Used  Substance Use Topics  . Alcohol use: No  Alcohol/week: 0.0 standard drinks    Family History Family History  Problem Relation Age of Onset  . Arthritis Mother   . Heart attack Father   . Colon cancer Neg Hx   . Breast cancer Neg Hx     Prior to Admission medications   Medication Sig Start Date End Date Taking? Authorizing Provider  acetaminophen (TYLENOL) 650 MG CR tablet Take 650 mg by mouth every 4 (four) hours as needed for pain.    Yes [provider]  atorvastatin (LIPITOR) 80 MG tablet Take 1 tablet (80 mg total) by mouth daily at 6 PM. 05/17/19  Yes Cheryln Manly, NP  clopidogrel (PLAVIX) 75 MG tablet Take 1 tablet (75 mg total) by mouth daily with breakfast. 05/17/19  Yes Cheryln Manly, NP  folic acid (FOLVITE) 1 MG tablet Take 1 mg by mouth every evening.    Yes [provider]  allopurinol (ZYLOPRIM)  300 MG tablet Take 1 tablet by mouth once daily Patient taking differently: Take 300 mg by mouth daily.  03/28/19   Einar Pheasant, MD  amLODipine (NORVASC) 10 MG tablet TAKE 1 TABLET BY MOUTH ONCE DAILY Patient taking differently: Take 10 mg by mouth daily.  08/03/18   Einar Pheasant, MD  aspirin 81 MG tablet Take 81 mg by mouth daily. Reported on 07/24/2015    [provider]  fluticasone (FLONASE) 50 MCG/ACT nasal spray Place 2 sprays into the nose daily. Patient not taking: Reported on 06/01/2019 05/25/12   Einar Pheasant, MD  gabapentin (NEURONTIN) 100 MG capsule Take 1 capsule (100 mg total) by mouth 3 (three) times daily. 05/23/19   Einar Pheasant, MD  glucose blood (BAYER CONTOUR TEST) test strip USE  STRIP TO CHECK GLUCOSE TWICE DAILY. Dx: E11.9 05/29/17   Einar Pheasant, MD  losartan (COZAAR) 100 MG tablet Take 1 tablet by mouth once daily Patient taking differently: Take 100 mg by mouth daily.  03/10/19   Einar Pheasant, MD  metFORMIN (GLUCOPHAGE) 500 MG tablet Take 1 tablet by mouth once daily Patient taking differently: Take 500 mg by mouth daily with breakfast. Give w/food. 02/21/19   Einar Pheasant, MD  methotrexate 2.5 MG tablet Take 10 mg by mouth every Wednesday.     [provider]  metoprolol succinate (TOPROL-XL) 50 MG 24 hr tablet TAKE 1 TABLET BY MOUTH ONCE DAILY IMMEDIATELY  FOLLOWING  A  MEAL 05/19/19   Einar Pheasant, MD  MICROLET LANCETS MISC Check sugar once daily, [employer] Microlet Lancets. Dx E11.9 10/02/14   Einar Pheasant, MD  pantoprazole (PROTONIX) 40 MG tablet Take 1 tablet (40 mg total) by mouth daily. 05/17/19   Cheryln Manly, NP  spironolactone (ALDACTONE) 25 MG tablet Take 1 tablet by mouth once daily Patient taking differently: Take 25 mg by mouth daily.  12/16/18   Einar Pheasant, MD  traMADol (ULTRAM) 50 MG tablet Take 1 tablet (50 mg total) by mouth 2 (two) times daily as needed (pain.). 05/23/19   Einar Pheasant, MD     Anti-infectives (From admission, onward)   Start     Dose/Rate Route Frequency Ordered Stop   06/03/19 0000  ceFAZolin (ANCEF) IVPB 2g/100 mL premix    Note to Pharmacy: To be given in specials   2 g 200 mL/hr over 30 Minutes Intravenous  Once 06/02/19 2154     06/03/19 0000  clindamycin (CLEOCIN) IVPB 300 mg    Note to Pharmacy: To be given in specials   300 mg 100  mL/hr over 30 Minutes Intravenous  Once 06/02/19 2154     06/02/19 1400  ceFAZolin (ANCEF) IVPB 1 g/50 mL premix     1 g 100 mL/hr over 30 Minutes Intravenous Every 8 hours 06/02/19 1223        Scheduled Meds: . allopurinol  300 mg Oral Daily  . amLODipine  10 mg Oral Daily  . aspirin EC  81 mg Oral Daily  . atorvastatin  80 mg Oral q1800  . clopidogrel  75 mg Oral Q breakfast  . enoxaparin (LOVENOX) injection  40 mg Subcutaneous Q24H  . folic acid  1 mg Oral QPM  . gabapentin  100 mg Oral TID  . insulin aspart  0-5 Units Subcutaneous QHS  . insulin aspart  0-9 Units Subcutaneous TID WC  . losartan  100 mg Oral Daily  . methotrexate  10 mg Oral Q Wed  . metoprolol succinate  50 mg Oral Daily  . mupirocin ointment  1 application Nasal BID  . pantoprazole  40 mg Oral Daily  . spironolactone  25 mg Oral Daily   Continuous Infusions: . sodium chloride 75 mL/hr at 06/03/19 0600  . sodium chloride    .  ceFAZolin (ANCEF) IV Stopped (06/02/19 2224)  .  ceFAZolin (ANCEF) IV    . clindamycin (CLEOCIN) IV    . magnesium sulfate bolus IVPB     PRN Meds:.acetaminophen, diphenhydrAMINE, famotidine, methylPREDNISolone (SOLU-MEDROL) injection, midazolam, morphine injection, oxyCODONE-acetaminophen, traMADol  Allergies  Allergen Reactions  . Maxzide [Triamterene-Hctz] Other (See Comments)    Weakness/fatigue     Physical Exam  Vitals  Blood pressure 136/88, pulse 89, temperature 98.9 F (37.2 C), temperature source Oral, resp. rate 17, height 5\' 6"  (1.676 m), weight 102 kg, last menstrual period 05/25/1988,  SpO2 94 %.  Lower Extremity exam: Bandages clean dry and intact no excessive bleeding is noted.  She is fairly comfortable with it with only minimal complaints of pain.  She states she is taking. On exam also palpated her great toe color is little better to the great toe but still tender with palpation.  Because her toe progressed and developed gangrene hopefully we can see some improvement with the catheterization and potential angioplasty today depending upon what the findings are. CBC Recent Labs  Lab 06/01/19 1546 06/02/19 0432 06/03/19 0416  WBC 5.0 5.2 6.6  HGB 12.1 11.7* 12.6  HCT 38.9 37.0 39.7  PLT 153 132* 120*  MCV 99.0 97.6 98.3  MCH 30.8 30.9 31.2  MCHC 31.1 31.6 31.7  RDW 17.7* 17.8* 17.6*   ------------------------------------------------------------------------------------------------------------------  Chemistries  Recent Labs  Lab 06/01/19 1546 06/02/19 0432 06/03/19 0416  NA  --  136 137  K  --  4.3 4.9  CL  --  103 101  CO2  --  26 28  GLUCOSE  --  82 82  BUN  --  22 19  CREATININE 1.24* 1.00 1.10*  CALCIUM  --  9.9 9.8  MG  --   --  1.4*  AST  --  18  --   ALT  --  12  --   ALKPHOS  --  55  --   BILITOT  --  0.9  --    ------------------------------------------------------------------------------------------------------------------ estimated creatinine clearance is 50.8 mL/min (A) (by C-G formula based on SCr of 1.1 mg/dL (H)). ------------------------------------------------------------------------------------------------------------------ Recent Labs    06/01/19 1546  TSH 3.558   Urinalysis    Component Value Date/Time   COLORURINE YELLOW 11/24/2018 1004  APPEARANCEUR CLEAR 11/24/2018 1004   LABSPEC 1.025 11/24/2018 1004   PHURINE 5.5 11/24/2018 1004   GLUCOSEU NEGATIVE 11/24/2018 1004   HGBUR NEGATIVE 11/24/2018 1004   BILIRUBINUR SMALL (A) 11/24/2018 1004   KETONESUR NEGATIVE 11/24/2018 1004   UROBILINOGEN 1.0 11/24/2018 1004    NITRITE NEGATIVE 11/24/2018 1004   LEUKOCYTESUR TRACE (A) 11/24/2018 1004     Imaging results:   No results found.  Assessment & Plan: Leave dressing to the left foot intact today I will change it tomorrow and take a look at it.  She is scheduled for left lower extremity catheterization today and angioplasty as necessary.  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

## 2019-06-03 NOTE — Progress Notes (Signed)
PROGRESS NOTE    Nina Perez  [id number] DOB: 1940/08/10 DOA: 06/01/2019 PCP: Einar Pheasant, MD    Brief Narrative:  79 y/o female with history of RA, HTN, DM, admitted to the hospital with gangrene of 4th left MTP. She has undergone amputation and is currently being evaluated for further vascular work up. Pt is doing well today is not happy about being NPO.  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 Gangrene of 4th MTP. Seen by podiatry. S/p amputation. He is on IV ancef. Continue to follow clinically 2. PAD. Seen by vascular surgery and plans are for repeat left lower extremity angiogram today  with possible intervention. Continue on aspirin, plavix and statin 3. Diabetes. Blood sugars stable. Continue on SSI. Holding oral agents 4. HLD. Continue on statin 5. Rheumatoid arthritis. No signs of flare. Continue on methotrexate  6. HTN. Stable on losartan   7. CKD stage 3. Creatinine is currently at baseline   DVT prophylaxis: lovenox Code Status: full code Family Communication: discussed with husband at bedside Disposition Plan: pending physical therapy evaluation   Consultants:   Podiatry  Vascular surgery  Procedures:   1/21: Amputation fourth toe to MTP joint left foot  Antimicrobials:   Ancef 1/21>    Subjective: Pain is controlled right now. No shortness of breath or chest pain  Objective: Vitals:   06/03/19 1245 06/03/19 1300 06/03/19 1338 06/03/19 1421  BP: (!) 152/69 (!) 155/70 (!) 156/73 (!) 125/48  Pulse: 74 76 77 81  Resp: 16 18 17 16   Temp:   98.3 F (36.8 C) 98.2 F (36.8 C)  TempSrc:   Oral Oral    SpO2: 96% 97% 92% 95%  Weight:      Height:        Intake/Output Summary (Last 24 hours) at 06/03/2019 1423 Last data filed at 06/03/2019 1407 Gross per 24 hour  Intake 653.94 ml  Output 2450 ml  Net -1796.06 ml   Filed Weights   06/01/19 1425 06/02/19 1107  Weight: 102 kg 102 kg    Examination:  General exam: Appears calm and comfortable  Respiratory system: Clear to auscultation. Respiratory effort normal. Cardiovascular system: S1 & S2 heard, RRR. No JVD, murmurs, rubs, gallops or clicks. No pedal edema. Gastrointestinal system: Abdomen is nondistended, soft and nontender. No organomegaly or masses felt. Normal bowel sounds heard. Central nervous system: Alert and oriented. No focal neurological deficits. Extremities: left foot is wrapped in dressing and ace wrap Skin: No rashes, lesions or ulcers Psychiatry: Judgement and insight appear normal. Mood & affect appropriate.     Data Reviewed: I have personally reviewed following labs and imaging studies  CBC: Recent Labs  Lab 06/01/19 1546 06/02/19 0432 06/03/19 0416  WBC 5.0 5.2 6.6  HGB 12.1 11.7* 12.6  HCT 38.9 37.0 39.7  MCV 99.0 97.6 98.3  PLT 153 132* [id number]*   Basic Metabolic Panel: Recent Labs  Lab 06/01/19 1546 06/02/19 0432 06/03/19 0416  NA  --  136 137  K  --  4.3 4.9  CL  --  103 101  CO2  --  26 28  GLUCOSE  --  82 82  BUN  --  22 19  CREATININE 1.24* 1.00 1.10*  CALCIUM  --  9.9 9.8  MG  --   --  1.4*   GFR: Estimated Creatinine Clearance: 50.8 mL/min (A) (by C-G formula based on SCr of 1.1 mg/dL (H)). Liver Function Tests: Recent Labs  Lab 06/02/19 0432  AST 18  ALT 12  ALKPHOS 55  BILITOT 0.9  PROT 6.5  ALBUMIN 3.1*   No results for input(s): LIPASE, AMYLASE in the last 168 hours. No results for input(s): AMMONIA in the last 168 hours. Coagulation Profile: Recent Labs  Lab 06/02/19 0432  INR 1.1   Cardiac Enzymes: No results for input(s): CKTOTAL, CKMB, CKMBINDEX,  TROPONINI in the last 168 hours. BNP (last 3 results) No results for input(s): PROBNP in the last 8760 hours. HbA1C: Recent Labs    06/01/19 1546  HGBA1C 5.7*   CBG: Recent Labs  Lab 06/03/19 0029 06/03/19 0403 06/03/19 0544 06/03/19 0833 06/03/19 1019  GLUCAP 121* 77 78 93 99   Lipid Profile: No results for input(s): CHOL, HDL, LDLCALC, TRIG, CHOLHDL, LDLDIRECT in the last 72 hours. Thyroid Function Tests: Recent Labs    06/01/19 1546  TSH 3.558   Anemia Panel: No results for input(s): VITAMINB12, FOLATE, FERRITIN, TIBC, IRON, RETICCTPCT in the last 72 hours. Sepsis Labs: No results for input(s): PROCALCITON, LATICACIDVEN in the last 168 hours.  Recent Results (from the past 240 hour(s))  Surgical PCR screen     Status: None   Collection Time: 06/01/19  2:38 PM   Specimen: Nasal Mucosa; Nasal Swab  Result Value Ref Range Status   MRSA, PCR NEGATIVE NEGATIVE Final   Staphylococcus aureus NEGATIVE NEGATIVE Final    Comment: (NOTE) The Xpert SA Assay (FDA approved for NASAL specimens in patients 67 years of age and older), is one component of a comprehensive surveillance program. It is not intended to diagnose infection nor to guide or monitor treatment. Performed at [name], [address] [place], [place] [address]   Respiratory Panel by RT PCR (Flu A&B, Covid) - Nasopharyngeal Swab     Status: None   Collection Time: 06/01/19  5:15 PM   Specimen: Nasopharyngeal Swab  Result Value Ref Range Status   SARS Coronavirus 2 by RT PCR NEGATIVE NEGATIVE Final    Comment: (NOTE) SARS-CoV-2 target nucleic acids are NOT DETECTED. The SARS-CoV-2 RNA is generally detectable in upper respiratoy specimens during the acute phase of infection. The lowest concentration of SARS-CoV-2 viral copies this assay can detect is 131 copies/mL. A negative result does not preclude SARS-Cov-2 infection and should not be used as the sole basis for treatment or other  patient management decisions. A negative result may occur with  improper specimen collection/handling, submission of specimen other than nasopharyngeal swab, presence of viral mutation(s) within the areas targeted by this assay, and inadequate number of viral copies (<131 copies/mL). A negative result must be combined with clinical observations, patient history, and epidemiological information. The expected result is Negative. Fact Sheet for Patients:  PinkCheek.be Fact Sheet for Healthcare Providers:  GravelBags.it This test is not yet ap proved or cleared by the [place] FDA and  has been authorized for detection and/or diagnosis of SARS-CoV-2 by FDA under an Emergency Use Authorization (EUA). This EUA will remain  in effect (meaning this test can be used) for the duration of the COVID-19 declaration under Section 564(b)(1) of the Act, 21 U.S.C. section 360bbb-3(b)(1), unless the authorization is  terminated or revoked sooner.    Influenza A by PCR NEGATIVE NEGATIVE Final   Influenza B by PCR NEGATIVE NEGATIVE Final    Comment: (NOTE) The Xpert Xpress SARS-CoV-2/FLU/RSV assay is intended as an aid in  the diagnosis of influenza from Nasopharyngeal swab specimens and  should not be used as a sole basis for treatment. Nasal washings and  aspirates are unacceptable for Xpert Xpress SARS-CoV-2/FLU/RSV  testing. Fact Sheet for Patients: PinkCheek.be Fact Sheet for Healthcare Providers: GravelBags.it This test is not yet approved or cleared by the [place] FDA and  has been authorized for detection and/or diagnosis of SARS-CoV-2 by  FDA under an Emergency Use Authorization (EUA). This EUA will remain  in effect (meaning this test can be used) for the duration of the  Covid-19 declaration under Section 564(b)(1) of the Act, 21  U.S.C. section 360bbb-3(b)(1), unless  the authorization is  terminated or revoked. Performed at [facility], [address] [place],  [address]          Radiology Studies: PERIPHERAL VASCULAR CATHETERIZATION  Result Date: 06/03/2019 See Op Note   Scheduled Meds: . allopurinol  300 mg Oral Daily  . amLODipine  10 mg Oral Daily  . aspirin EC  81 mg Oral Daily  . atorvastatin  80 mg Oral q1800  . clopidogrel  75 mg Oral Q breakfast  . enoxaparin (LOVENOX) injection  40 mg Subcutaneous Q24H  . fentaNYL      . folic acid  1 mg Oral QPM  . gabapentin  100 mg Oral TID  . heparin      . insulin aspart  0-5 Units Subcutaneous QHS  . insulin aspart  0-9 Units Subcutaneous TID WC  . losartan  100 mg Oral Daily  . methotrexate  10 mg Oral Q Wed  . metoprolol succinate  50 mg Oral Daily  . midazolam      . mupirocin ointment  1 application Nasal BID  . pantoprazole  40 mg Oral Daily  . sodium chloride flush  3 mL Intravenous Q12H  . spironolactone  25 mg Oral Daily   Continuous Infusions: . sodium chloride    . sodium chloride 75 mL/hr at 06/03/19 1405  .  ceFAZolin (ANCEF) IV Stopped (06/02/19 2224)     LOS: 2 days    Time spent: 32mins    Para Skeans, MD [facility] If 7PM-7AM, please contact night-coverage [email or web address]  06/03/2019, 2:23 PM

## 2019-06-03 NOTE — Progress Notes (Signed)
NPO for vascular procedure. Blood sugar 77, Ouma NP notified and D50 12.5 adm. IV

## 2019-06-03 NOTE — Care Management Important Message (Addendum)
Important Message  Patient Details  Name: Nina Perez MRN: [id number] Date of Birth: 1940/10/19   Medicare Important Message Given:  Yes  Initial Medicare IM given by Patient Access Associate on 06/02/2019 at 10:20am.   Dannette Barbara 06/03/2019, 8:25 AM

## 2019-06-03 NOTE — Op Note (Signed)
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 Introduction catheter into left lower extremity 3rd order catheter placement  2. Contrast injection left lower extremity for distal runoff with additional 3rd order  3. Percutaneous transluminal angioplasty left anterior tibial to 3 mm              4. Star close closure right common femoral arteriotomy  Anesthesia: Conscious sedation was administered under my direct supervision by the interventional radiology RN. IV Versed plus fentanyl were utilized. Continuous ECG, pulse oximetry and blood pressure was monitored throughout the entire procedure.  Conscious sedation was for a total of 1 hour 17 minutes.  Sheath: 6 French Raby right common femoral retrograde  Contrast: 80 cc  Fluoroscopy Time: 7.7 minutes  Indications: Nina Perez presents with increasing pain of the left lower extremity.  She presented with gangrene of the left fourth toe and is status post toe amputation yesterday.  This suggests the patient is having limb threatening ischemia. The risks and benefits are reviewed all questions answered patient agrees to proceed.  Procedure:Nina Perez is a 79 y.o. y.o. female who was identified and appropriate procedural time out was performed. The patient was then placed supine on the table and prepped and draped in the usual sterile fashion.   Ultrasound was placed in the sterile sleeve and the right groin was evaluated the right common femoral artery was echolucent and pulsatile indicating patency. Image was recorded for the permanent record and under  real-time visualization a microneedle was inserted into the common femoral artery followed by the microwire and then the micro-sheath. A J-wire was then advanced through the micro-sheath and a 5 [place] sheath was then inserted over a J-wire. J-wire was then advanced and a 5 French pigtail catheter was positioned at the level of T12.  AP projection of the aorta was then obtained. Pigtail catheter was repositioned to above the bifurcation and a RAO view of the pelvis was obtained. Subsequently a pigtail catheter with the advantage Glidewire was used to cross the aortic bifurcation the catheter wire were advanced down into the left distal external iliac artery. Oblique view of the femoral bifurcation was then obtained and subsequently the wire was reintroduced and the pigtail catheter negotiated into the SFA representing third order catheter placement. Distal runoff was then performed.  Diagnostic interpretation: The abdominal aorta is opacified with a bolus injection of contrast.  There are no hemodynamically significant strictures or stenoses.  Bilateral single renal arteries are noted and they are widely patent.  Bilateral common and external iliac arteries are widely patent.  The internal iliac arteries are opacified bilaterally on the left there is a greater than 80% ostial stenosis noted.  The left common femoral profunda femoris superficial femoral-popliteal arteries are widely patent.  Recent femoral-popliteal intervention appears to be successful.  The trifurcation demonstrates increased disease with occlusion of the anterior tibial approximately 1 cm distal to its origin.  The anterior tibial artery is reconstituted via peroneal collaterals at the level of the malleolus I.  The tibioperoneal trunk and peroneal is widely patent down to the ankle where it has a collateral that directly fills the distal anterior tibial.  There is a flush occlusion of the posterior tibial and the posterior tibial  remains  occluded throughout its entire course.  There is no filling or reconstitution of the plantar arteries.  Based on these findings I elected to move forward with intervention.  5000 units of heparin was then given and allowed to circulate and a 6 [place] Raby sheath was advanced up and over the bifurcation and positioned in the femoral artery  A 135 straight seeker catheter and an advantage wire were then negotiated down to the trifurcation wire and catheter were then used to select the anterior tibial.  The anterior tibial occlusion was then crossed with the wire catheter combination and the catheter tip advanced down to just below the level of the malleolar line. Hand injection contrast demonstrated the distal anterior tibial anatomy in detail as well as confirms intraluminal placement.  A 3 mm x 30 cm Ultraverse balloon was used to angioplasty the anterior tibial.  The inflation was for 2 minutes at 12 atm. Follow-up imaging demonstrated excellent patency with less than 5 % residual stenosis and preservation of the distal runoff.  After review of these images the sheath is pulled into the right external iliac oblique of the common femoral is obtained and a Star close device deployed. There no immediate Complications.  Findings:  The abdominal aorta is opacified with a bolus injection of contrast.  There are no hemodynamically significant strictures or stenoses.  Bilateral single renal arteries are noted and they are widely patent.  Bilateral common and external iliac arteries are widely patent.  The internal iliac arteries are opacified bilaterally on the left there is a greater than 80% ostial stenosis noted.  The left common femoral profunda femoris superficial femoral-popliteal arteries are widely patent.  Recent femoral-popliteal intervention appears to be successful.  The trifurcation demonstrates increased disease with occlusion of the anterior tibial approximately 1 cm distal to its origin.  The  anterior tibial artery is reconstituted via peroneal collaterals at the level of the malleolus I.  The tibioperoneal trunk and peroneal is widely patent down to the ankle where it has a collateral that directly fills the distal anterior tibial.  There is a flush occlusion of the posterior tibial and the posterior tibial remains occluded throughout its entire course.  There is no filling or reconstitution of the plantar arteries.  Following angioplasty the AT  tibial now is now widely patent and demonstrates in-line flow.  It is much improved and looks quite nice with less than 5% residual stenosis.    Summary: Successful recanalization left lower extremity for limb salvage   Disposition: Patient was taken to the recovery room in stable condition having tolerated the procedure well.  Nina Perez 06/03/2019,12:30 PM

## 2019-06-04 LAB — BASIC METABOLIC PANEL
Anion gap: 8 (ref 5–15)
BUN: 15 mg/dL (ref 8–23)
CO2: 29 mmol/L (ref 22–32)
Calcium: 9.6 mg/dL (ref 8.9–10.3)
Chloride: 98 mmol/L (ref 98–111)
Creatinine, Ser: 1.04 mg/dL — ABNORMAL HIGH (ref 0.44–1.00)
GFR calc Af Amer: 60 mL/min — ABNORMAL LOW (ref >60)
GFR calc non Af Amer: 51 mL/min — ABNORMAL LOW (ref >60)
Glucose, Bld: 91 mg/dL (ref 70–99)
Potassium: 4.6 mmol/L (ref 3.5–5.1)
Sodium: 135 mmol/L (ref 135–145)

## 2019-06-04 LAB — CBC
HCT: 39.8 % (ref 36.0–46.0)
Hemoglobin: 12.6 g/dL (ref 12.0–15.0)
MCH: 31 pg (ref 26.0–34.0)
MCHC: 31.7 g/dL (ref 30.0–36.0)
MCV: 97.8 fL (ref 80.0–100.0)
Platelets: 112 10*3/uL — ABNORMAL LOW (ref 150–400)
RBC: 4.07 MIL/uL (ref 3.87–5.11)
RDW: 18 % — ABNORMAL HIGH (ref 11.5–15.5)
WBC: 6 10*3/uL (ref 4.0–10.5)
nRBC: 0 % (ref 0.0–0.2)

## 2019-06-04 LAB — GLUCOSE, CAPILLARY
Glucose-Capillary: 84 mg/dL (ref 70–99)
Glucose-Capillary: 95 mg/dL (ref 70–99)
Glucose-Capillary: 87 mg/dL (ref 70–99)
Glucose-Capillary: 99 mg/dL (ref 70–99)

## 2019-06-04 MED ORDER — ACETAMINOPHEN 325 MG PO TABS
650.0000 mg | ORAL_TABLET | ORAL | Status: DC | PRN
  Administered 2019-06-05 – 2019-06-06 (×2): 650 mg via ORAL
  Filled 2019-06-04 (×2): qty 2

## 2019-06-04 NOTE — Progress Notes (Signed)
Post PT eval pt had small amt of blood on dressing at amputation area.  No need to reinforced dressing at this time.  Will continue to monitor.

## 2019-06-04 NOTE — Progress Notes (Signed)
PROGRESS NOTE    Nina Perez  [id number] DOB: December 14, 1940 DOA: 06/01/2019 PCP: Einar Pheasant, MD    Brief Narrative:  1/22:  79 y/o female with history of RA, HTN, DM, admitted to the hospital with gangrene of 4th left MTP. She has undergone amputation and is currently being evaluated for further vascular work up. Pt is doing well today is not happy about being NPO.  1/23: Patient feeling well postoperatively.  Left lower extremity wrapped.  Dressings not removed at time of my evaluation.  No pain endorsed.  Patient is able to move the toe.  Toes warm to touch.  Discussed care with podiatry consulted.  He evaluated the wound at bedside today.  From his standpoint patient is okay for discharge.  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 Seen by podiatry S/p amputation.  Postop check by podiatry today Okay for discharge from their standpoint Continue on intravenous Ancef while in-house Transition to p.o. on discharge, suggested Keflex Seen by physical therapy, recommending skilled nursing facility placement Consult for transitions of care team placed  PAD Status post lower extremity angiography Status post transluminal angioplasty left anterior tibial. Successful revascularization extremities Continue on aspirin, plavix and statin  Diabetes Blood sugars stable.  Continue on SSI.  Holding oral agents  HLD  Continue on statin  Rheumatoid arthritis No signs of flare Continue on methotrexate   HTN Stable on losartan    CKD stage 3  Creatinine is currently at baseline   DVT prophylaxis: lovenox Code  Status: full code Family Communication: Discussed with patient Disposition Plan: Anticipate skilled nursing facility   Consultants:   Podiatry  Vascular surgery  Procedures:   1/21: Amputation fourth toe to MTP joint left foot  Antimicrobials:   Ancef 1/21>    Subjective: Seen and examined Good pain control No new complaints  Objective: Vitals:   06/04/19 0037 06/04/19 0448 06/04/19 0500 06/04/19 0813  BP: (!) 135/58 (!) 144/75  134/62  Pulse: 77 79  81  Resp: 17 16  18   Temp: (!) 100.4 F (38 C) 99.7 F (37.6 C)  99.6 F (37.6 C)  TempSrc:  Oral    SpO2: 92% 97%  92%  Weight:   101.2 kg   Height:        Intake/Output Summary (Last 24 hours) at 06/04/2019 1316 Last data filed at 06/04/2019 0500 Gross per 24 hour  Intake 407.68 ml  Output 1100 ml  Net -692.32 ml   Filed Weights   06/01/19 1425 06/02/19 1107 06/04/19 0500  Weight: 102 kg 102 kg 101.2 kg    Examination:  General exam: Appears calm and comfortable  Respiratory system: Clear to auscultation. Respiratory effort normal. Cardiovascular system: S1 & S2 heard, RRR. No JVD, murmurs, rubs, gallops or clicks. No pedal edema. Gastrointestinal system: Abdomen is nondistended, soft and nontender. No organomegaly or masses felt. Normal bowel sounds heard. Central nervous system: Alert and oriented. No focal neurological deficits. Extremities: left foot is wrapped in dressing and ace wrap Skin: No rashes, lesions or ulcers Psychiatry: Judgement and insight appear normal. Mood & affect appropriate.  Data Reviewed: I have personally reviewed following labs and imaging studies  CBC: Recent Labs  Lab 06/01/19 1546 06/02/19 0432 06/03/19 0416 06/04/19 0735  WBC 5.0 5.2 6.6 6.0  HGB 12.1 11.7* 12.6 12.6  HCT 38.9 37.0 39.7 39.8  MCV 99.0 97.6 98.3 97.8  PLT 153 132* 120* [id number]*   Basic Metabolic Panel: Recent Labs  Lab 06/01/19 1546 06/02/19 0432 06/03/19 0416 06/04/19 0735  NA  --  136  137 135  K  --  4.3 4.9 4.6  CL  --  103 101 98  CO2  --  26 28 29   GLUCOSE  --  82 82 91  BUN  --  22 19 15   CREATININE 1.24* 1.00 1.10* 1.04*  CALCIUM  --  9.9 9.8 9.6  MG  --   --  1.4*  --    GFR: Estimated Creatinine Clearance: 53.6 mL/min (A) (by C-G formula based on SCr of 1.04 mg/dL (H)). Liver Function Tests: Recent Labs  Lab 06/02/19 0432  AST 18  ALT 12  ALKPHOS 55  BILITOT 0.9  PROT 6.5  ALBUMIN 3.1*   No results for input(s): LIPASE, AMYLASE in the last 168 hours. No results for input(s): AMMONIA in the last 168 hours. Coagulation Profile: Recent Labs  Lab 06/02/19 0432  INR 1.1   Cardiac Enzymes: No results for input(s): CKTOTAL, CKMB, CKMBINDEX, TROPONINI in the last 168 hours. BNP (last 3 results) No results for input(s): PROBNP in the last 8760 hours. HbA1C: Recent Labs    06/01/19 1546  HGBA1C 5.7*   CBG: Recent Labs  Lab 06/03/19 1019 06/03/19 1721 06/03/19 2122 06/04/19 0811 06/04/19 1154  GLUCAP 99 141* 96 84 95   Lipid Profile: No results for input(s): CHOL, HDL, LDLCALC, TRIG, CHOLHDL, LDLDIRECT in the last 72 hours. Thyroid Function Tests: Recent Labs    06/01/19 1546  TSH 3.558   Anemia Panel: No results for input(s): VITAMINB12, FOLATE, FERRITIN, TIBC, IRON, RETICCTPCT in the last 72 hours. Sepsis Labs: No results for input(s): PROCALCITON, LATICACIDVEN in the last 168 hours.  Recent Results (from the past 240 hour(s))  Surgical PCR screen     Status: None   Collection Time: 06/01/19  2:38 PM   Specimen: Nasal Mucosa; Nasal Swab  Result Value Ref Range Status   MRSA, PCR NEGATIVE NEGATIVE Final   Staphylococcus aureus NEGATIVE NEGATIVE Final    Comment: (NOTE) The Xpert SA Assay (FDA approved for NASAL specimens in patients 7 years of age and older), is one component of a comprehensive surveillance program. It is not intended to diagnose infection nor to guide or monitor treatment. Performed at [facility], [address] [place], [place] [address]   Respiratory Panel by RT PCR (Flu A&B, Covid) - Nasopharyngeal Swab     Status: None   Collection Time: 06/01/19  5:15 PM   Specimen: Nasopharyngeal Swab  Result Value Ref Range Status   SARS Coronavirus 2 by RT PCR NEGATIVE NEGATIVE Final    Comment: (NOTE) SARS-CoV-2 target nucleic acids are NOT DETECTED. The SARS-CoV-2 RNA is generally detectable in upper respiratoy specimens during the acute phase of infection. The lowest concentration of SARS-CoV-2 viral copies this assay can detect is 131 copies/mL. A negative result does not preclude SARS-Cov-2 infection and should not be used as the sole basis for treatment or other patient management decisions. A negative result may occur with  improper specimen collection/handling, submission of specimen other than nasopharyngeal swab, presence of  viral mutation(s) within the areas targeted by this assay, and inadequate number of viral copies (<131 copies/mL). A negative result must be combined with clinical observations, patient history, and epidemiological information. The expected result is Negative. Fact Sheet for Patients:  PinkCheek.be Fact Sheet for Healthcare Providers:  GravelBags.it This test is not yet ap proved or cleared by the [place] FDA and  has been authorized for detection and/or diagnosis of SARS-CoV-2 by FDA under an Emergency Use Authorization (EUA). This EUA will remain  in effect (meaning this test can be used) for the duration of the COVID-19 declaration under Section 564(b)(1) of the Act, 21 U.S.C. section 360bbb-3(b)(1), unless the authorization is terminated or revoked sooner.    Influenza A by PCR NEGATIVE NEGATIVE Final   Influenza B by PCR NEGATIVE NEGATIVE Final    Comment: (NOTE) The Xpert Xpress SARS-CoV-2/FLU/RSV assay is intended as an aid in  the diagnosis of influenza from Nasopharyngeal  swab specimens and  should not be used as a sole basis for treatment. Nasal washings and  aspirates are unacceptable for Xpert Xpress SARS-CoV-2/FLU/RSV  testing. Fact Sheet for Patients: PinkCheek.be Fact Sheet for Healthcare Providers: GravelBags.it This test is not yet approved or cleared by the [place] FDA and  has been authorized for detection and/or diagnosis of SARS-CoV-2 by  FDA under an Emergency Use Authorization (EUA). This EUA will remain  in effect (meaning this test can be used) for the duration of the  Covid-19 declaration under Section 564(b)(1) of the Act, 21  U.S.C. section 360bbb-3(b)(1), unless the authorization is  terminated or revoked. Performed at [facility], [address], [place], [place] [address]          Radiology Studies: PERIPHERAL VASCULAR CATHETERIZATION  Result Date: 06/03/2019 See Op Note   Scheduled Meds: . allopurinol  300 mg Oral Daily  . amLODipine  10 mg Oral Daily  . aspirin EC  81 mg Oral Daily  . atorvastatin  80 mg Oral q1800  . clopidogrel  75 mg Oral Q breakfast  . enoxaparin (LOVENOX) injection  40 mg Subcutaneous Q24H  . folic acid  1 mg Oral QPM  . gabapentin  100 mg Oral TID  . insulin aspart  0-5 Units Subcutaneous QHS  . insulin aspart  0-9 Units Subcutaneous TID WC  . losartan  100 mg Oral Daily  . methotrexate  10 mg Oral Q Wed  . metoprolol succinate  50 mg Oral Daily  . mupirocin ointment  1 application Nasal BID  . pantoprazole  40 mg Oral Daily  . sodium chloride flush  3 mL Intravenous Q12H  . spironolactone  25 mg Oral Daily   Continuous Infusions: . sodium chloride 250 mL (06/04/19 0526)  .  ceFAZolin (ANCEF) IV 1 g (06/04/19 0527)     LOS: 3 days    Time spent: 54mins    Sidney Ace, MD [facility] If 7PM-7AM, please contact night-coverage [email or web address]  06/04/2019, 1:16 PM

## 2019-06-04 NOTE — Progress Notes (Signed)
[facility]                                                      Patient Demographics  Nina Perez, is a 79 y.o. female   MRN: [id number]   DOB - January 12, 1941  Admit Date - 06/01/2019    Outpatient Primary MD for the patient is Einar Pheasant, MD  Consult requested in the Hospital by Sidney Ace, MD, On 06/04/2019    Past Medical History:  Diagnosis Date  . Barrett's esophagus   . Cancer ([place])    skin ca  . Diabetes mellitus ([place])   . Gout   . History of colon cancer    adenomatous polyps  . Hypercholesterolemia   . Hypertension   . Inflammatory arthritis   . Osteoarthritis    knees, spine  . Phlebitis    x2 (with pregnancy)  . 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 Pulmonary hypertension ([place])   . Reflux esophagitis   . Rheumatoid arthritis(714.0)    positive RF, FANA, RNP, negative CCP ab and anti DNA,.  neg anti-SCL 70  . 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 ABDOMINAL AORTOGRAM W/LOWER EXTREMITY Left 05/16/2019   Procedure: ABDOMINAL AORTOGRAM W/LOWER EXTREMITY;  Surgeon: Lorretta Harp, MD;  Location: [place] [facility];  Service: Cardiovascular;  Laterality: Left;  . AMPUTATION TOE Left 06/02/2019   Procedure: AMPUTATION TOE;  Surgeon: Albertine Patricia, DPM;  Location: [facility];  Service: Podiatry;  Laterality: Left;  . BREAST CYST ASPIRATION Left    neg  . LOWER EXTREMITY ANGIOGRAPHY Left 06/03/2019   Procedure: Lower Extremity Angiography;  Surgeon: Katha Cabal, MD;  Location: [place] [facility];  Service: Cardiovascular;  Laterality: Left;  . PERIPHERAL VASCULAR BALLOON ANGIOPLASTY Left 05/16/2019   Procedure: PERIPHERAL VASCULAR BALLOON ANGIOPLASTY;  Surgeon: Lorretta Harp, MD;  Location: [place] [facility];  Service:  Cardiovascular;  Laterality: Left;  SFA  . VEIN LIGATION AND STRIPPING     HPI  Nina Perez  is a 79 y.o. female, 2 days status post amputation fourth toe left foot due to gangrene.  In addition she had an angioplasty done yesterday with success in opening up the anterior tibial artery.  She has a history of vascular disease and developed gangrene of the last month and the fourth toe left foot.      Social History Social History   Tobacco Use  . Smoking status: Former Smoker    Packs/day: 0.50    Years: 15.00    Pack years: 7.50    Quit date: 05/12/1988    Years since quitting: 31.0  . Smokeless tobacco: Never Used  Substance Use Topics  . Alcohol use: No    Alcohol/week: 0.0 standard drinks    Family History Family History  Problem Relation Age of Onset  . Arthritis Mother   . Heart attack Father   . Colon cancer Neg Hx   . Breast cancer Neg Hx     Prior to Admission medications   Medication Sig Start Date End Date Taking? Authorizing Provider  acetaminophen (TYLENOL) 650 MG CR tablet Take 650 mg by mouth every 4 (four) hours as needed for pain.    Yes  [provider]  atorvastatin (LIPITOR) 80 MG tablet Take 1 tablet (80 mg total) by mouth daily at 6 PM. 05/17/19  Yes Cheryln Manly, NP  clopidogrel (PLAVIX) 75 MG tablet Take 1 tablet (75 mg total) by mouth daily with breakfast. 05/17/19  Yes Cheryln Manly, NP  folic acid (FOLVITE) 1 MG tablet Take 1 mg by mouth every evening.    Yes [provider]  allopurinol (ZYLOPRIM) 300 MG tablet Take 1 tablet by mouth once daily Patient taking differently: Take 300 mg by mouth daily.  03/28/19   Einar Pheasant, MD  amLODipine (NORVASC) 10 MG tablet TAKE 1 TABLET BY MOUTH ONCE DAILY Patient taking differently: Take 10 mg by mouth daily.  08/03/18   Einar Pheasant, MD  aspirin 81 MG tablet Take 81 mg by mouth daily. Reported on 07/24/2015    [provider]  fluticasone (FLONASE) 50 MCG/ACT nasal  spray Place 2 sprays into the nose daily. Patient not taking: Reported on 06/01/2019 05/25/12   Einar Pheasant, MD  gabapentin (NEURONTIN) 100 MG capsule Take 1 capsule (100 mg total) by mouth 3 (three) times daily. 05/23/19   Einar Pheasant, MD  glucose blood (BAYER CONTOUR TEST) test strip USE  STRIP TO CHECK GLUCOSE TWICE DAILY. Dx: E11.9 05/29/17   Einar Pheasant, MD  losartan (COZAAR) 100 MG tablet Take 1 tablet by mouth once daily Patient taking differently: Take 100 mg by mouth daily.  03/10/19   Einar Pheasant, MD  metFORMIN (GLUCOPHAGE) 500 MG tablet Take 1 tablet by mouth once daily Patient taking differently: Take 500 mg by mouth daily with breakfast. Give w/food. 02/21/19   Einar Pheasant, MD  methotrexate 2.5 MG tablet Take 10 mg by mouth every Wednesday.     [provider]  metoprolol succinate (TOPROL-XL) 50 MG 24 hr tablet TAKE 1 TABLET BY MOUTH ONCE DAILY IMMEDIATELY  FOLLOWING  A  MEAL 05/19/19   Einar Pheasant, MD  MICROLET LANCETS MISC Check sugar once daily, [employer] Microlet Lancets. Dx E11.9 10/02/14   Einar Pheasant, MD  pantoprazole (PROTONIX) 40 MG tablet Take 1 tablet (40 mg total) by mouth daily. 05/17/19   Cheryln Manly, NP  spironolactone (ALDACTONE) 25 MG tablet Take 1 tablet by mouth once daily Patient taking differently: Take 25 mg by mouth daily.  12/16/18   Einar Pheasant, MD  traMADol (ULTRAM) 50 MG tablet Take 1 tablet (50 mg total) by mouth 2 (two) times daily as needed (pain.). 05/23/19   Einar Pheasant, MD    Anti-infectives (From admission, onward)   Start     Dose/Rate Route Frequency Ordered Stop   06/03/19 0000  ceFAZolin (ANCEF) IVPB 2g/100 mL premix    Note to Pharmacy: To be given in specials   2 g 200 mL/hr over 30 Minutes Intravenous  Once 06/02/19 2154 06/03/19 1154   06/03/19 0000  clindamycin (CLEOCIN) IVPB 300 mg  Status:  Discontinued    Note to Pharmacy: To be given in specials   300 mg 100 mL/hr over 30 Minutes Intravenous   Once 06/02/19 2154 06/03/19 1035   06/02/19 1400  ceFAZolin (ANCEF) IVPB 1 g/50 mL premix     1 g 100 mL/hr over 30 Minutes Intravenous Every 8 hours 06/02/19 1223        Scheduled Meds: . allopurinol  300 mg Oral Daily  . amLODipine  10 mg Oral Daily  . aspirin EC  81 mg Oral Daily  . atorvastatin  80 mg Oral  q1800  . clopidogrel  75 mg Oral Q breakfast  . enoxaparin (LOVENOX) injection  40 mg Subcutaneous Q24H  . folic acid  1 mg Oral QPM  . gabapentin  100 mg Oral TID  . insulin aspart  0-5 Units Subcutaneous QHS  . insulin aspart  0-9 Units Subcutaneous TID WC  . losartan  100 mg Oral Daily  . methotrexate  10 mg Oral Q Wed  . metoprolol succinate  50 mg Oral Daily  . mupirocin ointment  1 application Nasal BID  . pantoprazole  40 mg Oral Daily  . sodium chloride flush  3 mL Intravenous Q12H  . spironolactone  25 mg Oral Daily   Continuous Infusions: . sodium chloride 250 mL (06/04/19 0526)  .  ceFAZolin (ANCEF) IV 1 g (06/04/19 0527)   PRN Meds:.sodium chloride, acetaminophen, hydrALAZINE, labetalol, morphine injection, ondansetron (ZOFRAN) IV, oxyCODONE, sodium chloride flush, traMADol  Allergies  Allergen Reactions  . Maxzide [Triamterene-Hctz] Other (See Comments)    Weakness/fatigue     Physical Exam  Vitals  Blood pressure 134/62, pulse 81, temperature 99.6 F (37.6 C), resp. rate 18, height 5\' 6"  (1.676 m), weight 101.2 kg, last menstrual period 05/25/1988, SpO2 92 %.  Lower Extremity exam: Dressing was removed today and the incision margin intact and appears to be stable.  Some erythema around the wound site which is within normal limits did have a little bit of drainage from the incision that appeared to be the oxygen eyes blood but no evidence of purulence.  I did take a culture of this today.  Most likely she has little bit of a hematoma.  I felt like it was important to leave her on her anticoagulant therapy throughout the surgical process due to her very  poor circulation. The color to her toes is very good at this point and she states that her foot feels better from overall standpoint.  She states that the big toe is no longer sore as it was.  CBC Recent Labs  Lab 06/01/19 1546 06/02/19 0432 06/03/19 0416 06/04/19 0735  WBC 5.0 5.2 6.6 6.0  HGB 12.1 11.7* 12.6 12.6  HCT 38.9 37.0 39.7 39.8  PLT 153 132* 120* 112*  MCV 99.0 97.6 98.3 97.8  MCH 30.8 30.9 31.2 31.0  MCHC 31.1 31.6 31.7 31.7  RDW 17.7* 17.8* 17.6* 18.0*   ------------------------------------------------------------------------------------------------------------------  Chemistries  Recent Labs  Lab 06/01/19 1546 06/02/19 0432 06/03/19 0416 06/04/19 0735  NA  --  136 137 135  K  --  4.3 4.9 4.6  CL  --  103 101 98  CO2  --  26 28 29   GLUCOSE  --  82 82 91  BUN  --  22 19 15   CREATININE 1.24* 1.00 1.10* 1.04*  CALCIUM  --  9.9 9.8 9.6  MG  --   --  1.4*  --   AST  --  18  --   --   ALT  --  12  --   --   ALKPHOS  --  55  --   --   BILITOT  --  0.9  --   --    ------------------------------------------------------------------------------PERIPHERAL VASCULAR CATHETERIZATION  Result Date: 06/03/2019 See Op Note   Assessment & Plan: As noted I get a culture from the the oxidized bleeding from the area just to make sure there is no infection.  Orders put in today.  I will have physical therapy come work this morning for bathroom  privileges using a walker.  I redressed the foot today she can leave this on until next week when she comes back in my office.  She does not need to change the dressing on this area but leave it intact and stable clean and dry.  She should use a walker whenever she gets up to walk or move around and also use her postop shoe on left foot whenever ambulating or standing.  She will need some pain management control postoperatively as well is unlikely to be on a continued dose of the first generation cephalosporin.  Cephalexin 500 mg 3 times a  day should be adequate.  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  Albertine Patricia M.D on 06/04/2019 at 10:00 AM  Thank you for the consult, we will follow the patient with you in the Hospital.

## 2019-06-04 NOTE — Evaluation (Signed)
Physical Therapy Evaluation Patient Details Name: Nina Perez MRN: [id number] DOB: 27-Jan-1941 Today's Date: 06/04/2019   History of Present Illness  Patient is a 79 year old female admitted with gangrene of left toe, s/p amputation of left toe 1/21 and s/p angioplasty left LE 1/22. PMH includes DM, RA, HTN, gerd, PAD.  Clinical Impression  Patient received in bed, sleepy. Agrees to PT session. Patient requires min assist for supine to sit, increased time, use of bed rail. Performed sit to stand x 2 from elevated surface with +2 assist. Patient is able to stand with support and rolling walker for brief time before needing to sit back down. Unable to shift weight or take any steps at this time due to weakness. Patient required mod assist to return to supine from sitting on the edge of bed. She will continue to benefit from skilled PT while here to improve strength and functional mobility.       Follow Up Recommendations SNF;Supervision/Assistance - 24 hour    Equipment Recommendations  Rolling walker with 5" wheels    Recommendations for Other Services       Precautions / Restrictions Precautions Precautions: Fall Restrictions Weight Bearing Restrictions: Yes LLE Weight Bearing: Weight bearing as tolerated Other Position/Activity Restrictions: with post op shoe donned      Mobility  Bed Mobility Overal bed mobility: Needs Assistance Bed Mobility: Supine to Sit;Sit to Supine     Supine to sit: Min assist Sit to supine: Mod assist   General bed mobility comments: requires min assist to bring LEs off bed, mod assist to bring LEs back up onto bed. Increased time, use of bed rails.  Transfers Overall transfer level: Needs assistance Equipment used: Rolling walker (2 wheeled) Transfers: Sit to/from Stand Sit to Stand: Mod assist;+2 physical assistance;From elevated surface         General transfer comment: patient requires assist from myself and her husband to stand. Once  standing she is unable to shift weight. STS performed x2  Ambulation/Gait             General Gait Details: unable to ambulate  Stairs            Wheelchair Mobility    Modified Rankin (Stroke Patients Only)       Balance Overall balance assessment: Needs assistance Sitting-balance support: Feet supported Sitting balance-Leahy Scale: Fair     Standing balance support: Bilateral upper extremity supported;During functional activity Standing balance-Leahy Scale: Poor Standing balance comment: limited standing tolerance, requires B UE support                             Pertinent Vitals/Pain Pain Assessment: 0-10 Pain Score: 8  Pain Location: L foot Pain Descriptors / Indicators: Aching;Sore Pain Intervention(s): Premedicated before session;Monitored during session    Home Living Family/patient expects to be discharged to:: Private residence Living Arrangements: Spouse/significant other Available Help at Discharge: Available 24 hours/day Type of Home: House       Home Layout: Two level;Able to live on main level with bedroom/bathroom Home Equipment: Gilmer Mor - single point Additional Comments: may have access to RW, if not, she will need one prior to discharge    Prior Function Level of Independence: Independent with assistive device(s)               Hand Dominance        Extremity/Trunk Assessment   Upper Extremity Assessment Upper Extremity Assessment: Generalized  weakness    Lower Extremity Assessment Lower Extremity Assessment: Generalized weakness    Cervical / Trunk Assessment Cervical / Trunk Assessment: Normal  Communication   Communication: No difficulties  Cognition Arousal/Alertness: Awake/alert;Lethargic Behavior During Therapy: WFL for tasks assessed/performed Overall Cognitive Status: Within Functional Limits for tasks assessed                                        General Comments       Exercises     Assessment/Plan    PT Assessment Patient needs continued PT services  PT Problem List Decreased strength;Decreased mobility;Decreased activity tolerance;Decreased balance;Pain;Decreased safety awareness;Decreased knowledge of use of DME       PT Treatment Interventions Therapeutic exercise;DME instruction;Gait training;Functional mobility training;Therapeutic activities;Patient/family education    PT Goals (Current goals can be found in the Care Plan section)  Acute Rehab PT Goals Patient Stated Goal: to go home PT Goal Formulation: With patient/family Time For Goal Achievement: 06/08/19 Potential to Achieve Goals: Fair    Frequency Min 2X/week   Barriers to discharge        Co-evaluation               AM-PAC PT "6 Clicks" Mobility  Outcome Measure Help needed turning from your back to your side while in a flat bed without using bedrails?: A Lot Help needed moving from lying on your back to sitting on the side of a flat bed without using bedrails?: A Lot Help needed moving to and from a bed to a chair (including a wheelchair)?: Total Help needed standing up from a chair using your arms (e.g., wheelchair or bedside chair)?: A Lot Help needed to walk in hospital room?: Total Help needed climbing 3-5 steps with a railing? : Total 6 Click Score: 9    End of Session Equipment Utilized During Treatment: Gait belt Activity Tolerance: Patient limited by fatigue;Patient limited by pain Patient left: in bed;with call bell/phone within reach;with bed alarm set;with family/visitor present Nurse Communication: Mobility status;Other (comment)(left foot bleeding onto bandage after session) PT Visit Diagnosis: Muscle weakness (generalized) (M62.81);Other abnormalities of gait and mobility (R26.89);Pain;Difficulty in walking, not elsewhere classified (R26.2) Pain - Right/Left: Left Pain - part of body: Ankle and joints of foot    Time: 1120-1143 PT Time Calculation  (min) (ACUTE ONLY): 23 min   Charges:   PT Evaluation $PT Eval Moderate Complexity: 1 Mod PT Treatments $Therapeutic Activity: 8-22 mins        Mea Ozga, PT, GCS 06/04/19,11:49 AM

## 2019-06-04 NOTE — Plan of Care (Signed)
No further drainage noted on R foot dressing.  Oxycodone given  once for R foot pain with improvement.  Pt had low grade fever.  Fever resolved by decreasing room temp.

## 2019-06-05 ENCOUNTER — Inpatient Hospital Stay: Payer: Medicare Other

## 2019-06-05 DIAGNOSIS — K2100 Gastro-esophageal reflux disease with esophagitis, without bleeding: Secondary | ICD-10-CM

## 2019-06-05 LAB — GLUCOSE, CAPILLARY
Glucose-Capillary: 93 mg/dL (ref 70–99)
Glucose-Capillary: 102 mg/dL — ABNORMAL HIGH (ref 70–99)
Glucose-Capillary: 108 mg/dL — ABNORMAL HIGH (ref 70–99)
Glucose-Capillary: 107 mg/dL — ABNORMAL HIGH (ref 70–99)

## 2019-06-05 NOTE — Progress Notes (Signed)
PROGRESS NOTE    Nina Perez  [id number] DOB: 03-20-1941 DOA: 06/01/2019 PCP: Einar Pheasant, MD    Brief Narrative:  79 y/o female with history of RA, HTN, DM, admitted to the hospital with gangrene of 4th left MTP. She has undergone amputation and is currently being evaluated for further vascular work up.   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 Seen by podiatry S/p amputation.  Okay for discharge from their standpoint Continue on intravenous Ancef while in-house Transition to p.o. on discharge, suggested Keflex Seen by physical therapy, recommending skilled nursing facility placement Consult for transitions of care team placed   Fever-spiked today 100.5 Will check bcx, ucx, cxr Continue cefazolin for now   PAD Status post lower extremity angiography Status post transluminal angioplasty left anterior tibial. Successful revascularization extremities Continue on aspirin, plavix and statin  Diabetes Blood sugars stable.  Continue on SSI.  Holding oral agents  HLD  Continue on statin  Rheumatoid arthritis No signs of flare Continue on methotrexate   HTN Stable on losartan    CKD stage 3  Creatinine is currently at baseline   DVT prophylaxis: lovenox Code Status: full code Family Communication: None Disposition Plan: SNF pending   Consultants:   Podiatry  Vascular surgery  Procedures:   1/21: Amputation fourth toe to MTP joint left foot  Antimicrobials:   Ancef 1/21>    Subjective: Patient complaining of arthritic pain otherwise has no new complaints.  Physical therapy at  bedside this AM.   Objective: Vitals:   06/05/19 0037 06/05/19 0807 06/05/19 1406 06/05/19 1600  BP: (!) 117/59 123/64 133/63   Pulse: 86 87 84   Resp: 16 18 18    Temp: 98.4 F (36.9 C) 98.8 F (37.1 C) (!) 100.5 F (38.1 C) 100.1 F (37.8 C)  TempSrc:   Oral Oral  SpO2: 97% 93% 91%   Weight:      Height:        Intake/Output Summary (Last 24 hours) at 06/05/2019 1708 Last data filed at 06/05/2019 1548 Gross per 24 hour  Intake 275.85 ml  Output 1000 ml  Net -724.15 ml   Filed Weights   06/01/19 1425 06/02/19 1107 06/04/19 0500  Weight: 102 kg 102 kg 101.2 kg    Examination:  General exam: Appears calm and comfortable, NAD sitting up in bed Respiratory system: Clear to auscultation. Respiratory effort normal. Cardiovascular system: S1 & S2 heard, RRR. No JVD, murmurs, rubs, gallops or clicks.  Gastrointestinal system: Abdomen is nondistended, soft and nontender. Normal bowel sounds heard. Central nervous system: Alert and oriented. No focal neurological deficits. Extremities: left foot is wrapped in dressing and ace wrap, in boot, right lower extremity no edema Skin: Warm dry Psychiatry: Judgement and insight appear normal. Mood & affect appropriate.     Data Reviewed: I have personally reviewed following labs and imaging studies  CBC: Recent Labs  Lab 06/01/19 1546 06/02/19 0432 06/03/19 0416 06/04/19 0735  WBC 5.0 5.2 6.6 6.0  HGB 12.1 11.7* 12.6 12.6  HCT 38.9 37.0 39.7 39.8  MCV 99.0 97.6 98.3 97.8  PLT 153 132*  120* [id number]*   Basic Metabolic Panel: Recent Labs  Lab 06/01/19 1546 06/02/19 0432 06/03/19 0416 06/04/19 0735  NA  --  136 137 135  K  --  4.3 4.9 4.6  CL  --  103 101 98  CO2  --  26 28 29   GLUCOSE  --  82 82 91  BUN  --  22 19 15   CREATININE 1.24* 1.00 1.10* 1.04*  CALCIUM  --  9.9 9.8 9.6  MG  --   --  1.4*  --    GFR: Estimated Creatinine Clearance: 53.6 mL/min (A) (by C-G formula based on SCr of 1.04 mg/dL (H)). Liver  Function Tests: Recent Labs  Lab 06/02/19 0432  AST 18  ALT 12  ALKPHOS 55  BILITOT 0.9  PROT 6.5  ALBUMIN 3.1*   No results for input(s): LIPASE, AMYLASE in the last 168 hours. No results for input(s): AMMONIA in the last 168 hours. Coagulation Profile: Recent Labs  Lab 06/02/19 0432  INR 1.1   Cardiac Enzymes: No results for input(s): CKTOTAL, CKMB, CKMBINDEX, TROPONINI in the last 168 hours. BNP (last 3 results) No results for input(s): PROBNP in the last 8760 hours. HbA1C: No results for input(s): HGBA1C in the last 72 hours. CBG: Recent Labs  Lab 06/04/19 1154 06/04/19 1625 06/04/19 2118 06/05/19 0805 06/05/19 1134  GLUCAP 95 87 99 93 102*   Lipid Profile: No results for input(s): CHOL, HDL, LDLCALC, TRIG, CHOLHDL, LDLDIRECT in the last 72 hours. Thyroid Function Tests: No results for input(s): TSH, T4TOTAL, FREET4, T3FREE, THYROIDAB in the last 72 hours. Anemia Panel: No results for input(s): VITAMINB12, FOLATE, FERRITIN, TIBC, IRON, RETICCTPCT in the last 72 hours. Sepsis Labs: No results for input(s): PROCALCITON, LATICACIDVEN in the last 168 hours.  Recent Results (from the past 240 hour(s))  Surgical PCR screen     Status: None   Collection Time: 06/01/19  2:38 PM   Specimen: Nasal Mucosa; Nasal Swab  Result Value Ref Range Status   MRSA, PCR NEGATIVE NEGATIVE Final   Staphylococcus aureus NEGATIVE NEGATIVE Final    Comment: (NOTE) The Xpert SA Assay (FDA approved for NASAL specimens in patients 11 years of age and older), is one component of a comprehensive surveillance program. It is not intended to diagnose infection nor to guide or monitor treatment. Performed at [facility], [address] [place], [place] [address]   Respiratory Panel by RT PCR (Flu A&B, Covid) - Nasopharyngeal Swab     Status: None   Collection Time: 06/01/19  5:15 PM   Specimen: Nasopharyngeal Swab  Result Value Ref Range Status   SARS Coronavirus 2 by RT  PCR NEGATIVE NEGATIVE Final    Comment: (NOTE) SARS-CoV-2 target nucleic acids are NOT DETECTED. The SARS-CoV-2 RNA is generally detectable in upper respiratoy specimens during the acute phase of infection. The lowest concentration of SARS-CoV-2 viral copies this assay can detect is 131 copies/mL. A negative result does not preclude SARS-Cov-2 infection and should not be used as the sole basis for treatment or other patient management decisions. A negative result may occur with  improper specimen collection/handling, submission of specimen other than nasopharyngeal swab, presence of viral mutation(s) within the areas targeted by this assay, and inadequate number of viral copies (<131 copies/mL). A negative result must be combined with clinical observations, patient history, and epidemiological information. The expected result is Negative. Fact Sheet for Patients:  PinkCheek.be Fact Sheet for Healthcare Providers:  GravelBags.it This test is not  yet ap proved or cleared by the [place] and  has been authorized for detection and/or diagnosis of SARS-CoV-2 by FDA under an Emergency Use Authorization (EUA). This EUA will remain  in effect (meaning this test can be used) for the duration of the COVID-19 declaration under Section 564(b)(1) of the Act, 21 U.S.C. section 360bbb-3(b)(1), unless the authorization is terminated or revoked sooner.    Influenza A by PCR NEGATIVE NEGATIVE Final   Influenza B by PCR NEGATIVE NEGATIVE Final    Comment: (NOTE) The Xpert Xpress SARS-CoV-2/FLU/RSV assay is intended as an aid in  the diagnosis of influenza from Nasopharyngeal swab specimens and  should not be used as a sole basis for treatment. Nasal washings and  aspirates are unacceptable for Xpert Xpress SARS-CoV-2/FLU/RSV  testing. Fact Sheet for Patients: PinkCheek.be Fact Sheet for Healthcare  Providers: GravelBags.it This test is not yet approved or cleared by the [place] FDA and  has been authorized for detection and/or diagnosis of SARS-CoV-2 by  FDA under an Emergency Use Authorization (EUA). This EUA will remain  in effect (meaning this test can be used) for the duration of the  Covid-19 declaration under Section 564(b)(1) of the Act, 21  U.S.C. section 360bbb-3(b)(1), unless the authorization is  terminated or revoked. Performed at [facility], [address] [place], [place] [address]   Aerobic Culture (superficial specimen)     Status: None (Preliminary result)   Collection Time: 06/04/19 10:19 AM   Specimen: Foot; Wound  Result Value Ref Range Status   Specimen Description   Final    FOOT Performed at [facility], [address] [place], [place] [address]    Special Requests   Final    Normal Performed at [facility], [address] [place], [place] [address]    Gram Stain   Final    MODERATE WBC PRESENT, PREDOMINANTLY PMN MODERATE GRAM NEGATIVE RODS FEW GRAM POSITIVE COCCI    Culture   Final    ABUNDANT PSEUDOMONAS AERUGINOSA ABUNDANT SERRATIA MARCESCENS SUSCEPTIBILITIES TO FOLLOW Performed at [facility], [place] [address], [place], [place] [address]    Report Status PENDING  Incomplete         Radiology Studies: No results found.  Scheduled Meds: . allopurinol  300 mg Oral Daily  . amLODipine  10 mg Oral Daily  . aspirin EC  81 mg Oral Daily  . atorvastatin  80 mg Oral q1800  . clopidogrel  75 mg Oral Q breakfast  . enoxaparin (LOVENOX) injection  40 mg Subcutaneous Q24H  . folic acid  1 mg Oral QPM  . gabapentin  100 mg Oral TID  . insulin aspart  0-5 Units Subcutaneous QHS  . insulin aspart  0-9 Units Subcutaneous TID WC  . losartan  100 mg Oral Daily  . methotrexate  10 mg Oral Q Wed  . metoprolol succinate  50 mg Oral Daily  . mupirocin ointment  1  application Nasal BID  . pantoprazole  40 mg Oral Daily  . sodium chloride flush  3 mL Intravenous Q12H  . spironolactone  25 mg Oral Daily   Continuous Infusions: . sodium chloride 10 mL/hr at 06/05/19 1548  .  ceFAZolin (ANCEF) IV Stopped (06/05/19 1450)     LOS: 4 days    Time spent: 45 minutes with more than 50% COC    Nolberto Hanlon, MD [facility] If 7PM-7AM, please contact night-coverage [email or web address]  06/05/2019, 5:08 PM  Patient ID: Nina Perez, female   DOB: 1941-02-09, 79 y.o.   MRN: [id number]

## 2019-06-05 NOTE — Progress Notes (Signed)
Physical Therapy Treatment Patient Details Name: Nina Perez MRN: [id number] DOB: 24-Mar-1941 Today's Date: 06/05/2019    History of Present Illness Patient is a 79 year old female admitted with gangrene of left toe, s/p amputation of left toe 1/21 and s/p angioplasty left LE 1/22. PMH includes DM, RA, HTN, gerd, PAD.    PT Comments    Patient agrees to PT treatment. She reports that her RLE is very painful and tolerates gentle movement to LLE . She requires max assist x 1 and use of hospital bed to achieve supine <> sit bed mobility. She is very slow and has difficulty with rolling, reaching, scooting or moving her LE's; all are max assist by PT. She has difficulty getting her bottom to the edge of the bed and takes extra time, eventually PT assists her by pulling on the sheet under her. She attempts sit to stand x 2 with RW but very little effort on her part is observed. The bed was elevated but the transfer was not successful as she could not lean fwd enough or use her UE's or LE's to assist herself with very minimal effort on her part. She performed BLE exercises seated in limited ROM and minimal effort . She will continue to benefit from skilled PT to improve mobility and strength.   Follow Up Recommendations  SNF;Supervision/Assistance - 24 hour     Equipment Recommendations  Rolling walker with 5" wheels    Recommendations for Other Services       Precautions / Restrictions Restrictions Weight Bearing Restrictions: Yes LLE Weight Bearing: Weight bearing as tolerated    Mobility  Bed Mobility Overal bed mobility: Needs Assistance Bed Mobility: Supine to Sit;Sit to Supine     Supine to sit: Mod assist Sit to supine: Mod assist   General bed mobility comments: difficult to get to edge of bed  Transfers Overall transfer level: Needs assistance Equipment used: Rolling walker (2 wheeled) Transfers: Sit to/from Stand Sit to Stand: (unable to get bottom off the bed, not much  effort/patient)            Ambulation/Gait             General Gait Details: unable to ambulate   Stairs             Wheelchair Mobility    Modified Rankin (Stroke Patients Only)       Balance Overall balance assessment: Needs assistance Sitting-balance support: Feet supported Sitting balance-Leahy Scale: Fair   Postural control: Posterior lean                                  Cognition Arousal/Alertness: Awake/alert Behavior During Therapy: WFL for tasks assessed/performed Overall Cognitive Status: Within Functional Limits for tasks assessed                                        Exercises      General Comments        Pertinent Vitals/Pain Pain Assessment: 0-10 Pain Score: 7  Pain Location: l foot Pain Descriptors / Indicators: Aching Pain Intervention(s): Limited activity within patient's tolerance;Monitored during session    Home Living                      Prior Function  PT Goals (current goals can now be found in the care plan section) Acute Rehab PT Goals Patient Stated Goal: to go home Progress towards PT goals: Not progressing toward goals - comment(poor effort demonstrated by patient)    Frequency    Min 2X/week      PT Plan Current plan remains appropriate    Co-evaluation              AM-PAC PT "6 Clicks" Mobility   Outcome Measure  Help needed turning from your back to your side while in a flat bed without using bedrails?: Total Help needed moving from lying on your back to sitting on the side of a flat bed without using bedrails?: Total Help needed moving to and from a bed to a chair (including a wheelchair)?: Total Help needed standing up from a chair using your arms (e.g., wheelchair or bedside chair)?: Total Help needed to walk in hospital room?: Total Help needed climbing 3-5 steps with a railing? : Total 6 Click Score: 6    End of Session Equipment  Utilized During Treatment: Gait belt Activity Tolerance: Patient limited by fatigue;Patient limited by lethargy;Patient limited by pain Patient left: in bed;with call bell/phone within reach Nurse Communication: Mobility status PT Visit Diagnosis: Muscle weakness (generalized) (M62.81);Other abnormalities of gait and mobility (R26.89);Pain;Difficulty in walking, not elsewhere classified (R26.2) Pain - Right/Left: Left Pain - part of body: Ankle and joints of foot     Time: 0900-0940 PT Time Calculation (min) (ACUTE ONLY): 40 min  Charges:  $Therapeutic Activity: 38-52 mins                        Alanson Puls, PT DPT 06/05/2019, 9:41 AM

## 2019-06-05 NOTE — Plan of Care (Signed)
Generalized weakness.  No new drainage during the shift to R foot dressing.  Oxycodone given once for R foot pain with improvement. Pt uses spirometer during the shift, but need to be encouraged and to reinforce.  Pt had fever 100.5, tylenol given with improvement.  Dr. Kurtis Bushman made aware of intermittent low grade fever in the past 3x days. MD placed orders. Pt c/o constipation.  Passing flatus. Last BM 06/01/19. MD made aware.  No new orders.

## 2019-06-06 ENCOUNTER — Telehealth: Payer: Self-pay | Admitting: Internal Medicine

## 2019-06-06 ENCOUNTER — Other Ambulatory Visit: Payer: Medicare Other

## 2019-06-06 ENCOUNTER — Encounter: Payer: Self-pay | Admitting: Internal Medicine

## 2019-06-06 DIAGNOSIS — I7301 Raynaud's syndrome with gangrene: Secondary | ICD-10-CM

## 2019-06-06 DIAGNOSIS — Z888 Allergy status to other drugs, medicaments and biological substances status: Secondary | ICD-10-CM

## 2019-06-06 DIAGNOSIS — L089 Local infection of the skin and subcutaneous tissue, unspecified: Secondary | ICD-10-CM

## 2019-06-06 DIAGNOSIS — Z794 Long term (current) use of insulin: Secondary | ICD-10-CM

## 2019-06-06 DIAGNOSIS — L97529 Non-pressure chronic ulcer of other part of left foot with unspecified severity: Secondary | ICD-10-CM

## 2019-06-06 DIAGNOSIS — Z9862 Peripheral vascular angioplasty status: Secondary | ICD-10-CM

## 2019-06-06 DIAGNOSIS — J8410 Pulmonary fibrosis, unspecified: Secondary | ICD-10-CM

## 2019-06-06 DIAGNOSIS — I272 Pulmonary hypertension, unspecified: Secondary | ICD-10-CM

## 2019-06-06 DIAGNOSIS — E11621 Type 2 diabetes mellitus with foot ulcer: Secondary | ICD-10-CM

## 2019-06-06 DIAGNOSIS — I10 Essential (primary) hypertension: Secondary | ICD-10-CM

## 2019-06-06 DIAGNOSIS — B9689 Other specified bacterial agents as the cause of diseases classified elsewhere: Secondary | ICD-10-CM

## 2019-06-06 DIAGNOSIS — E785 Hyperlipidemia, unspecified: Secondary | ICD-10-CM

## 2019-06-06 DIAGNOSIS — B965 Pseudomonas (aeruginosa) (mallei) (pseudomallei) as the cause of diseases classified elsewhere: Secondary | ICD-10-CM

## 2019-06-06 DIAGNOSIS — E11628 Type 2 diabetes mellitus with other skin complications: Secondary | ICD-10-CM

## 2019-06-06 DIAGNOSIS — E1152 Type 2 diabetes mellitus with diabetic peripheral angiopathy with gangrene: Principal | ICD-10-CM

## 2019-06-06 DIAGNOSIS — Z79899 Other long term (current) drug therapy: Secondary | ICD-10-CM

## 2019-06-06 DIAGNOSIS — Z89422 Acquired absence of other left toe(s): Secondary | ICD-10-CM

## 2019-06-06 DIAGNOSIS — M069 Rheumatoid arthritis, unspecified: Secondary | ICD-10-CM

## 2019-06-06 DIAGNOSIS — Z87891 Personal history of nicotine dependence: Secondary | ICD-10-CM

## 2019-06-06 LAB — GLUCOSE, CAPILLARY
Glucose-Capillary: 86 mg/dL (ref 70–99)
Glucose-Capillary: 94 mg/dL (ref 70–99)
Glucose-Capillary: 70 mg/dL (ref 70–99)
Glucose-Capillary: 124 mg/dL — ABNORMAL HIGH (ref 70–99)
Glucose-Capillary: 86 mg/dL (ref 70–99)

## 2019-06-06 MED ORDER — DOCUSATE SODIUM 100 MG PO CAPS
100.0000 mg | ORAL_CAPSULE | Freq: Two times a day (BID) | ORAL | Status: DC
  Administered 2019-06-06 – 2019-06-08 (×2): 100 mg via ORAL
  Filled 2019-06-06 (×3): qty 1

## 2019-06-06 MED ORDER — POLYETHYLENE GLYCOL 3350 17 G PO PACK: 17.0000 g | PACK | Freq: Every day | ORAL | 0 refills | Status: DC

## 2019-06-06 MED ORDER — SODIUM CHLORIDE 0.9 % IV SOLN
2.0000 g | Freq: Two times a day (BID) | INTRAVENOUS | Status: DC
  Administered 2019-06-06 – 2019-06-08 (×4): 2 g via INTRAVENOUS
  Filled 2019-06-06 (×5): qty 2

## 2019-06-06 MED ORDER — SENNOSIDES-DOCUSATE SODIUM 8.6-50 MG PO TABS
1.0000 | ORAL_TABLET | Freq: Two times a day (BID) | ORAL | Status: DC
  Administered 2019-06-06 – 2019-06-08 (×3): 1 via ORAL
  Filled 2019-06-06 (×4): qty 1

## 2019-06-06 MED ORDER — CIPROFLOXACIN HCL 500 MG PO TABS: 500.0000 mg | ORAL_TABLET | Freq: Two times a day (BID) | ORAL | 0 refills | Status: DC

## 2019-06-06 MED ORDER — POLYETHYLENE GLYCOL 3350 17 G PO PACK
17.0000 g | PACK | Freq: Every day | ORAL | Status: DC
  Administered 2019-06-06: 17 g via ORAL
  Filled 2019-06-06: qty 1

## 2019-06-06 MED ORDER — MUPIROCIN 2 % EX OINT: 1.0000 "application " | TOPICAL_OINTMENT | Freq: Two times a day (BID) | CUTANEOUS | 0 refills | Status: DC

## 2019-06-06 MED ORDER — FLEET ENEMA 7-19 GM/118ML RE ENEM
1.0000 | ENEMA | Freq: Every day | RECTAL | Status: DC | PRN
  Administered 2019-06-06: 1 via RECTAL

## 2019-06-06 MED ORDER — CIPROFLOXACIN HCL 500 MG PO TABS
500.0000 mg | ORAL_TABLET | Freq: Two times a day (BID) | ORAL | Status: DC
  Administered 2019-06-06: 500 mg via ORAL
  Filled 2019-06-06 (×2): qty 1

## 2019-06-06 MED ORDER — SENNOSIDES-DOCUSATE SODIUM 8.6-50 MG PO TABS: 1.0000 | ORAL_TABLET | Freq: Two times a day (BID) | ORAL | 0 refills | Status: DC

## 2019-06-06 NOTE — Progress Notes (Signed)
Patients BS reassessed and is now 124mg /dl... Given PRN Fleet enema d/t constipation ,

## 2019-06-06 NOTE — TOC Initial Note (Addendum)
Transition of Care [facility]) - Initial/Assessment Note    Patient Details  Name: Nina Perez MRN: [id number] Date of Birth: 11/29/1940  [facility] CM/SW Contact:    Candie Chroman, LCSW Phone Number: 06/06/2019, 10:56 AM  Clinical Narrative: CSW met with patient. Husband at bedside. CSW introduced role and explained that PT recommendations would be discussed. Patient prefers home health rather than going to SNF. Husband agrees with patient. They have family that is able to assist as needed. Husband has completed modifications to their home including ramps, safety bars in the walk in shower, etc. Patient typically uses a cane at home but also has a wheelchair. Her brother has a rolling walker she can use. Patient uses oxygen through [facility] in the mornings. Discussed potential for 3-in-1. Patient and husband are agreeable and would prefer that it be delivered to their home. Made referral for home health PT, OT, RN, aide, SW to [facility]. They are reviewing. Sent message to MD to notify. Patient and husband just want confirmation that this will be safe to do. No further concerns. CSW encouraged patient and her husband to contact CSW as needed. CSW will continue to follow patient for support and facilitate return home when stable. Patient will likely need EMS home. CSW confirmed address on facesheet is correct.  12:33 pm: Nina Perez can accept referral. Per podiatry note, plan for I&D tomorrow.             Expected Discharge Plan: [place] Barriers to Discharge: Continued Medical Work up   Patient Goals and CMS Choice Patient states their goals for this hospitalization and ongoing recovery are:: To go home      Expected Discharge Plan and Services Expected Discharge Plan: [place] Acute Care Choice: Durable Medical Equipment, Home Health Living arrangements for the past 2 months: Single Family Home                                      Prior Living Arrangements/Services Living arrangements for the past 2 months: Single Family Home Lives with:: Spouse Patient language and need for interpreter reviewed:: Yes Do you feel safe going back to the place where you live?: Yes      Need for Family Participation in Patient Care: Yes (Comment) Care giver support system in place?: Yes (comment) Current home services: DME Criminal Activity/Legal Involvement Pertinent to Current Situation/Hospitalization: No - Comment as needed  Activities of Daily Living Home Assistive Devices/Equipment: Cane (specify quad or straight) ADL Screening (condition at time of admission) Patient's cognitive ability adequate to safely complete daily activities?: Yes Is the patient deaf or have difficulty hearing?: No Does the patient have difficulty seeing, even when wearing glasses/contacts?: No Does the patient have difficulty concentrating, remembering, or making decisions?: No Patient able to express need for assistance with ADLs?: Yes Does the patient have difficulty dressing or bathing?: No Independently performs ADLs?: Yes (appropriate for developmental age) Does the patient have difficulty walking or climbing stairs?: Yes Weakness of Legs: Both Weakness of Arms/Hands: None  Permission Sought/Granted Permission sought to share information with : Facility Sport and exercise psychologist, Family Supports Permission granted to share information with : Yes, Verbal Permission Granted  Share Information with NAME: Missy Baksh  Permission granted to share info w AGENCY: [place] granted to share info w Relationship: Spouse  Permission granted to share info w Contact Information: [phone]  Emotional Assessment Appearance:: Appears stated age Attitude/Demeanor/Rapport: Engaged, Gracious Affect (typically observed): Accepting, Appropriate, Calm, Pleasant Orientation: : Oriented to Self, Oriented to Place, Oriented to   Time, Oriented to Situation Alcohol / Substance Use: Not Applicable Psych Involvement: No (comment)  Admission diagnosis:  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 Hypomagnesemia 06/03/2019  . CKD (chronic kidney disease) stage 3, GFR 30-59 ml/min 06/02/2019  . 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 Non-healing ulcer of left foot ([place]) 05/29/2019  . PAD (peripheral artery disease) ([place]) 05/29/2019  . Critical lower limb ischemia 05/16/2019  . Open wound of left foot 03/27/2019  . Decreased GFR 11/13/2018  . Bladder incontinence 11/13/2018  . Elevated TSH 11/13/2018  . Thrombocytopenia ([place]) 07/03/2018  . Acute right-sided thoracic back pain 06/16/2018  . Murmur 08/16/2017  . Lymphedema 03/04/2016  . Lesion of neck 12/15/2015  . Dizziness 12/15/2015  . Venous stasis dermatitis of right lower extremity 09/10/2015  . Breast nodule 11/05/2014  . Health care maintenance 11/02/2014  . Obesity (BMI 30-39.9) 06/04/2014  . Environmental allergies 01/15/2014  . Diabetes mellitus ([place]) 05/22/2012  . Hypertension 05/22/2012  . Hypercholesterolemia 05/22/2012  . Rheumatoid arthritis ([place]) 05/22/2012  . Barrett's esophagus 05/22/2012  . Gout 05/22/2012  . GERD (gastroesophageal reflux disease) 05/22/2012  . History of colon polyps 05/22/2012   PCP:  Einar Pheasant, MD Pharmacy:   [facility] [address] [place] [address] Phone: [phone] Fax: [phone]  [facility] [place], [place] [address] Phone: [phone] Fax: [phone]     Social Determinants of Health (SDOH) Interventions    Readmission Risk Interventions No flowsheet data found.

## 2019-06-06 NOTE — Progress Notes (Signed)
Patient consent signed for I/D to Left foot , post surgical wound to be performed by Dr. Elvina Mattes ,. Placed in medical chart. Given first dose of amoxicillin

## 2019-06-06 NOTE — Anesthesia Preprocedure Evaluation (Deleted)
Anesthesia Evaluation  Patient identified by MRN, date of birth, ID band Patient awake    Reviewed: Allergy & Precautions, H&P , NPO status , reviewed documented beta blocker date and time   Airway        Dental   Pulmonary shortness of breath and Long-Term Oxygen Therapy, former smoker,           Cardiovascular hypertension, + Peripheral Vascular Disease  + Valvular Problems/Murmurs      Neuro/Psych negative neurological ROS  negative psych ROS   GI/Hepatic GERD  Medicated and Controlled,  Endo/Other  diabetes  Renal/GU Renal InsufficiencyRenal disease     Musculoskeletal  (+) Arthritis , Rheumatoid disorders,    Abdominal   Peds  (+) 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   Anesthesia Other Findings Obesity  Past Medical History: No date: Barrett's esophagus No date: Cancer (HCC)     Comment:  skin ca No date: Diabetes mellitus (HCC) No date: Gout No date: History of colon cancer     Comment:  adenomatous polyps No date: Hypercholesterolemia No date: Hypertension No date: Inflammatory arthritis No date: Osteoarthritis     Comment:  knees, spine No date: Phlebitis     Comment:  x2 (with pregnancy) No date: Pulmonary fibrosis (HCC)     Comment:  mild No date: Pulmonary hypertension ([place]), mild per ECHO No date: Reflux esophagitis No date: Rheumatoid arthritis(714.0)     Comment:  positive RF, FANA, RNP, negative CCP ab and anti DNA,.                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, sclerodactyly, telangiectasias   Reproductive/Obstetrics                            Anesthesia Physical  Anesthesia Plan  ASA: III  Anesthesia Plan: General   Post-op Pain Management:    Induction: Intravenous  PONV Risk Score and Plan:   Airway Management Planned:   Additional Equipment:   Intra-op Plan:   Post-operative Plan:    Informed Consent: I have reviewed the patients History and Physical, chart, labs and discussed the procedure including the risks, benefits and alternatives for the proposed anesthesia with the patient or authorized representative who has indicated his/her understanding and acceptance.     Dental Advisory Given  Plan Discussed with: Anesthesiologist  Anesthesia Plan Comments:        Anesthesia Quick Evaluation

## 2019-06-06 NOTE — Progress Notes (Signed)
Patients BS was 70mg /dl given orange juice x 2 and graham crackers

## 2019-06-06 NOTE — Consult Note (Signed)
NAME: Nina Perez  DOB: April 11, 1941  MRN: [id number]  Date/Time: 06/06/2019 12:46 PM  REQUESTING PROVIDER: Elvina Mattes Subjective:  REASON FOR CONSULT: pseudomonas infection ?Husband at bedside- history from patient and chart Nina Perez is a 79 y.o.female  with a history of DM, Rheumatoid arthritis , Raynauds, sclerodactyly , PHT, pulmonary fibrosis,PVD, foot infection Is admitted  Pt has had a wound on the left foot for a few months . She was followed at the wound clinic. As  the toe was becoming gangrenous so she underwent   On 05/16/19  angio at [facility].  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 She was on cefazolin and the cultures came back as pseudomonas and serratia and she was changed to cipro today. As the surgical site has pus being expressed , Dr.Troxler is planning to take her for I/D tomorrow. I am seeing the patient for the infection.  She is followed by Dr.Kernodle for Rheumatoid arthritis and is on methotrexate. She had not taken it for a month and then took it 10 days ago Past Medical History:  Diagnosis Date  . Barrett's esophagus   . Cancer ([place])    skin ca  . Diabetes mellitus ([place])   . Gout   . History of colon cancer    adenomatous polyps  . Hypercholesterolemia   . Hypertension   . Inflammatory arthritis   . Osteoarthritis    knees, spine  . Phlebitis    x2 (with pregnancy)  . 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 Pulmonary hypertension ([place])   . Reflux esophagitis   . Rheumatoid arthritis(714.0)    positive RF, FANA, RNP, negative CCP ab and anti DNA,.  neg anti-SCL 70  . 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 ABDOMINAL AORTOGRAM W/LOWER EXTREMITY Left 05/16/2019   Procedure: ABDOMINAL AORTOGRAM W/LOWER  EXTREMITY;  Surgeon: Lorretta Harp, MD;  Location: [facility];  Service: Cardiovascular;  Laterality: Left;  . AMPUTATION TOE Left 06/02/2019   Procedure: AMPUTATION TOE;  Surgeon: Albertine Patricia, DPM;  Location: [facility];  Service: Podiatry;  Laterality: Left;  . BREAST CYST ASPIRATION Left    neg  . LOWER EXTREMITY ANGIOGRAPHY Left 06/03/2019   Procedure: Lower Extremity Angiography;  Surgeon: Katha Cabal, MD;  Location: [facility];  Service: Cardiovascular;  Laterality: Left;  . PERIPHERAL VASCULAR BALLOON ANGIOPLASTY Left 05/16/2019   Procedure: PERIPHERAL VASCULAR BALLOON ANGIOPLASTY;  Surgeon: Lorretta Harp, MD;  Location: [facility];  Service: Cardiovascular;  Laterality: Left;  SFA  . VEIN LIGATION AND STRIPPING      Social History   Socioeconomic History  . Marital status: Married    Spouse name: Not on file  . Number of children: Not on file  . Years of education: Not on file  . Highest education level: Not on file  Occupational History  . Not on file  Tobacco Use  . Smoking status: Former Smoker    Packs/day: 0.50    Years: 15.00    Pack years: 7.50    Quit date: 05/12/1988    Years since quitting: 31.0  . Smokeless tobacco: Never Used  Substance and Sexual Activity  . Alcohol use: No  Alcohol/week: 0.0 standard drinks  . Drug use: No  . Sexual activity: Yes  Other Topics Concern  . Not on file  Social History Narrative  . Not on file   Social Determinants of Health   Financial Resource Strain:   . Difficulty of Paying Living Expenses: Not on file  Food Insecurity:   . Worried About Charity fundraiser in the Last Year: Not on file  . Ran Out of Food in the Last Year: Not on file  Transportation Needs:   . Lack of Transportation (Medical): Not on file  . Lack of Transportation (Non-Medical): Not on file  Physical Activity:   . Days of Exercise per Week: Not on file  . Minutes of Exercise per Session: Not on file    Stress: No Stress Concern Present  . Feeling of Stress : Only a little  Social Connections:   . Frequency of Communication with Friends and Family: Not on file  . Frequency of Social Gatherings with Friends and Family: Not on file  . Attends Religious Services: Not on file  . Active Member of Clubs or Organizations: Not on file  . Attends Archivist Meetings: Not on file  . Marital Status: Not on file  Intimate Partner Violence:   . Fear of Current or Ex-Partner: Not on file  . Emotionally Abused: Not on file  . Physically Abused: Not on file  . Sexually Abused: Not on file    Family History  Problem Relation Age of Onset  . Arthritis Mother   . Heart attack Father   . Colon cancer Neg Hx   . Breast cancer Neg Hx    Allergies  Allergen Reactions  . Maxzide [Triamterene-Hctz] Other (See Comments)    Weakness/fatigue     ? Current Facility-Administered Medications  Medication Dose Route Frequency Provider Last Rate Last Admin  . 0.9 %  sodium chloride infusion  250 mL Intravenous PRN Schnier, Dolores Lory, MD   Stopped at 06/05/19 1553  . acetaminophen (TYLENOL) tablet 650 mg  650 mg Oral Q4H PRN Ralene Muskrat B, MD   650 mg at 06/06/19 0928  . allopurinol (ZYLOPRIM) tablet 300 mg  300 mg Oral Daily Schnier, Dolores Lory, MD   300 mg at 06/06/19 [id number]  . amLODipine (NORVASC) tablet 10 mg  10 mg Oral Daily Schnier, Dolores Lory, MD   10 mg at 06/06/19 [id number]  . aspirin EC tablet 81 mg  81 mg Oral Daily Schnier, Dolores Lory, MD   81 mg at 06/06/19 0926  . atorvastatin (LIPITOR) tablet 80 mg  80 mg Oral q1800 Schnier, Dolores Lory, MD   80 mg at 06/05/19 1750  . ciprofloxacin (CIPRO) tablet 500 mg  500 mg Oral BID Nolberto Hanlon, MD   500 mg at 06/06/19 1228  . clopidogrel (PLAVIX) tablet 75 mg  75 mg Oral Q breakfast Schnier, Dolores Lory, MD   75 mg at 06/06/19 0926  . enoxaparin (LOVENOX) injection 40 mg  40 mg Subcutaneous Q24H Schnier, Dolores Lory, MD   40 mg at 06/05/19 2154  .  folic acid (FOLVITE) tablet 1 mg  1 mg Oral QPM Schnier, Dolores Lory, MD   1 mg at 06/05/19 1750  . gabapentin (NEURONTIN) capsule 100 mg  100 mg Oral TID Katha Cabal, MD   100 mg at 06/06/19 [id number]  . hydrALAZINE (APRESOLINE) injection 5 mg  5 mg Intravenous Q20 Min PRN Schnier, Dolores Lory, MD      .  insulin aspart (novoLOG) injection 0-5 Units  0-5 Units Subcutaneous QHS Schnier, Dolores Lory, MD      . insulin aspart (novoLOG) injection 0-9 Units  0-9 Units Subcutaneous TID WC Schnier, Dolores Lory, MD   1 Units at 06/03/19 1737  . labetalol (NORMODYNE) injection 10 mg  10 mg Intravenous Q10 min PRN Schnier, Dolores Lory, MD      . losartan (COZAAR) tablet 100 mg  100 mg Oral Daily Schnier, Dolores Lory, MD   100 mg at 06/06/19 [id number]  . methotrexate (RHEUMATREX) tablet 10 mg  10 mg Oral Q Wed Schnier, Dolores Lory, MD      . metoprolol succinate (TOPROL-XL) 24 hr tablet 50 mg  50 mg Oral Daily Schnier, Dolores Lory, MD   50 mg at 06/06/19 [id number]  . morphine 2 MG/ML injection 2 mg  2 mg Intravenous Q4H PRN Schnier, Dolores Lory, MD   2 mg at 06/03/19 1410  . ondansetron (ZOFRAN) injection 4 mg  4 mg Intravenous Q6H PRN Schnier, Dolores Lory, MD      . oxyCODONE (Oxy IR/ROXICODONE) immediate release tablet 5-10 mg  5-10 mg Oral Q4H PRN Schnier, Dolores Lory, MD   5 mg at 06/06/19 0452  . pantoprazole (PROTONIX) EC tablet 40 mg  40 mg Oral Daily Schnier, Dolores Lory, MD   40 mg at 06/06/19 [id number]  . polyethylene glycol (MIRALAX / GLYCOLAX) packet 17 g  17 g Oral Daily Nolberto Hanlon, MD   17 g at 06/06/19 0931  . senna-docusate (Senokot-S) tablet 1 tablet  1 tablet Oral BID Nolberto Hanlon, MD   1 tablet at 06/06/19 0926  . sodium chloride flush (NS) 0.9 % injection 3 mL  3 mL Intravenous Q12H Schnier, Dolores Lory, MD   3 mL at 06/06/19 1000  . sodium chloride flush (NS) 0.9 % injection 3 mL  3 mL Intravenous PRN Schnier, Dolores Lory, MD   3 mL at 06/06/19 0931  . spironolactone (ALDACTONE) tablet 25 mg  25 mg Oral Daily Schnier, Dolores Lory, MD   25 mg at 06/06/19 [id number]  . traMADol (ULTRAM) tablet 50 mg  50 mg Oral BID PRN Schnier, Dolores Lory, MD   50 mg at 06/03/19 [id number]     Abtx:  Anti-infectives (From admission, onward)   Start     Dose/Rate Route Frequency Ordered Stop   06/06/19 1130  ciprofloxacin (CIPRO) tablet 500 mg     500 mg Oral 2 times daily 06/06/19 1125     06/06/19 0000  ciprofloxacin (CIPRO) 500 MG tablet     500 mg Oral 2 times daily 06/06/19 1158     06/03/19 0000  ceFAZolin (ANCEF) IVPB 2g/100 mL premix    Note to Pharmacy: To be given in specials   2 g 200 mL/hr over 30 Minutes Intravenous  Once 06/02/19 2154 06/03/19 1154   06/03/19 0000  clindamycin (CLEOCIN) IVPB 300 mg  Status:  Discontinued    Note to Pharmacy: To be given in specials   300 mg 100 mL/hr over 30 Minutes Intravenous  Once 06/02/19 2154 06/03/19 1035   06/02/19 1400  ceFAZolin (ANCEF) IVPB 1 g/50 mL premix  Status:  Discontinued     1 g 100 mL/hr over 30 Minutes Intravenous Every 8 hours 06/02/19 1223 06/06/19 1125      REVIEW OF SYSTEMS:  Const: negative fever, negative chills, negative weight loss Eyes: negative diplopia or visual changes, negative eye pain ENT: negative coryza, negative sore throat Resp: negative  cough, hemoptysis, mild dyspnea Cards: negative for chest pain, palpitations, lower extremity edema GU: negative for frequency, dysuria and hematuria GI: Negative for abdominal pain, diarrhea, bleeding, constipation Skin: negative for rash and pruritus Heme: negative for easy bruising and gum/nose bleeding MS: generalized weakness, c/o rt knee swelling and pain  Neurolo:negative for headaches, dizziness, vertigo, memory problems  Psych: negative for feelings of anxiety, depression  Endocrine: negative for thyroid, diabetes Allergy/Immunology- negative for any medication or food allergies ? Pertinent Positives include : Objective:  VITALS:  BP (!) 136/57 (BP Location: Right Arm)   Pulse 79   Temp 98.2 F  (36.8 C)   Resp 17   Ht 5\' 6"  (1.676 m)   Wt 101.2 kg   LMP 05/25/1988   SpO2 94%   BMI 36.01 kg/m  PHYSICAL EXAM:  General: Alert, cooperative, no distress, appears stated age.  Head: Normocephalic, without obvious abnormality, atraumatic. Eyes: Conjunctivae clear, anicteric sclerae. Pupils are equal ENT Nares normal. No drainage or sinus tenderness. Lips, mucosa, and tongue normal. No Thrush Neck: Supple, symmetrical, no adenopathy, thyroid: non tender no carotid bruit and no JVD. Back: No CVA tenderness. Lungs: Clear to auscultation bilaterally. No Wheezing or Rhonchi. No rales. Heart: Regular rate and rhythm, no murmur, rub or gallop. Abdomen: Soft, non-tender,not distended. Bowel sounds normal. No masses Extremities: atraumatic, no cyanosis. No edema. No clubbing Skin: No rashes or lesions. Or bruising Lymph: Cervical, supraclavicular normal. Neurologic: Grossly non-focal Pertinent Labs Lab Results CBC    Component Value Date/Time   WBC 6.0 06/04/2019 0735   RBC 4.07 06/04/2019 0735   HGB 12.6 06/04/2019 0735   HGB 13.3 04/29/2019 0917   HCT 39.8 06/04/2019 0735   HCT 41.0 04/29/2019 0917   PLT 112 (L) 06/04/2019 0735   PLT 157 04/29/2019 0917   MCV 97.8 06/04/2019 0735   MCV 97 04/29/2019 0917   MCH 31.0 06/04/2019 0735   MCHC 31.7 06/04/2019 0735   RDW 18.0 (H) 06/04/2019 0735   RDW 18.1 (H) 04/29/2019 0917   LYMPHSABS 0.9 06/22/2018 1008   MONOABS 0.4 06/22/2018 1008   EOSABS 0.1 06/22/2018 1008   BASOSABS 0.1 06/22/2018 1008    CMP Latest Ref Rng & Units 06/04/2019 06/03/2019 06/02/2019  Glucose 70 - 99 mg/dL 91 82 82  BUN 8 - 23 mg/dL 15 19 22   Creatinine 0.44 - 1.00 mg/dL 1.04(H) 1.10(H) 1.00  Sodium 135 - 145 mmol/L 135 137 136  Potassium 3.5 - 5.1 mmol/L 4.6 4.9 4.3  Chloride 98 - 111 mmol/L 98 101 103  CO2 22 - 32 mmol/L 29 28 26   Calcium 8.9 - 10.3 mg/dL 9.6 9.8 9.9  Total Protein 6.5 - 8.1 g/dL - - 6.5  Total Bilirubin 0.3 - 1.2 mg/dL - - 0.9   Alkaline Phos 38 - 126 U/L - - 55  AST 15 - 41 U/L - - 18  ALT 0 - 44 U/L - - 12      Microbiology: Recent Results (from the past 240 hour(s))  Surgical PCR screen     Status: None   Collection Time: 06/01/19  2:38 PM   Specimen: Nasal Mucosa; Nasal Swab  Result Value Ref Range Status   MRSA, PCR NEGATIVE NEGATIVE Final   Staphylococcus aureus NEGATIVE NEGATIVE Final    Comment: (NOTE) The Xpert SA Assay (FDA approved for NASAL specimens in patients 20 years of age and older), is one component of a comprehensive surveillance program. It is not intended to diagnose infection  nor to guide or monitor treatment. Performed at [facility], [address] [place], [place] [address]   Respiratory Panel by RT PCR (Flu A&B, Covid) - Nasopharyngeal Swab     Status: None   Collection Time: 06/01/19  5:15 PM   Specimen: Nasopharyngeal Swab  Result Value Ref Range Status   SARS Coronavirus 2 by RT PCR NEGATIVE NEGATIVE Final    Comment: (NOTE) SARS-CoV-2 target nucleic acids are NOT DETECTED. The SARS-CoV-2 RNA is generally detectable in upper respiratoy specimens during the acute phase of infection. The lowest concentration of SARS-CoV-2 viral copies this assay can detect is 131 copies/mL. A negative result does not preclude SARS-Cov-2 infection and should not be used as the sole basis for treatment or other patient management decisions. A negative result may occur with  improper specimen collection/handling, submission of specimen other than nasopharyngeal swab, presence of viral mutation(s) within the areas targeted by this assay, and inadequate number of viral copies (<131 copies/mL). A negative result must be combined with clinical observations, patient history, and epidemiological information. The expected result is Negative. Fact Sheet for Patients:  PinkCheek.be Fact Sheet for Healthcare Providers:   GravelBags.it This test is not yet ap proved or cleared by the [place] FDA and  has been authorized for detection and/or diagnosis of SARS-CoV-2 by FDA under an Emergency Use Authorization (EUA). This EUA will remain  in effect (meaning this test can be used) for the duration of the COVID-19 declaration under Section 564(b)(1) of the Act, 21 U.S.C. section 360bbb-3(b)(1), unless the authorization is terminated or revoked sooner.    Influenza A by PCR NEGATIVE NEGATIVE Final   Influenza B by PCR NEGATIVE NEGATIVE Final    Comment: (NOTE) The Xpert Xpress SARS-CoV-2/FLU/RSV assay is intended as an aid in  the diagnosis of influenza from Nasopharyngeal swab specimens and  should not be used as a sole basis for treatment. Nasal washings and  aspirates are unacceptable for Xpert Xpress SARS-CoV-2/FLU/RSV  testing. Fact Sheet for Patients: PinkCheek.be Fact Sheet for Healthcare Providers: GravelBags.it This test is not yet approved or cleared by the [place] FDA and  has been authorized for detection and/or diagnosis of SARS-CoV-2 by  FDA under an Emergency Use Authorization (EUA). This EUA will remain  in effect (meaning this test can be used) for the duration of the  Covid-19 declaration under Section 564(b)(1) of the Act, 21  U.S.C. section 360bbb-3(b)(1), unless the authorization is  terminated or revoked. Performed at [facility], [address] [place], [place] [address]   Aerobic Culture (superficial specimen)     Status: None (Preliminary result)   Collection Time: 06/04/19 10:19 AM   Specimen: Foot; Wound  Result Value Ref Range Status   Specimen Description   Final    FOOT Performed at [name], [address], [place], [place] [address]    Special Requests   Final    Normal Performed at [facility], [address] [place], [place]  [address]    Gram Stain   Final    MODERATE WBC PRESENT, PREDOMINANTLY PMN MODERATE GRAM NEGATIVE RODS FEW GRAM POSITIVE COCCI    Culture   Final    ABUNDANT PSEUDOMONAS AERUGINOSA ABUNDANT SERRATIA MARCESCENS CULTURE REINCUBATED FOR BETTER GROWTH Performed at [facility], [place] [address] [place], [place] [address]    Report Status PENDING  Incomplete   Organism ID, Bacteria PSEUDOMONAS AERUGINOSA  Final   Organism ID, Bacteria SERRATIA MARCESCENS  Final      Susceptibility   Pseudomonas aeruginosa - MIC*    CEFTAZIDIME 4 SENSITIVE Sensitive     CIPROFLOXACIN <=0.25 SENSITIVE Sensitive     GENTAMICIN 2 SENSITIVE Sensitive     IMIPENEM 2 SENSITIVE Sensitive     PIP/TAZO 8 SENSITIVE Sensitive     CEFEPIME 2 SENSITIVE Sensitive     * ABUNDANT PSEUDOMONAS AERUGINOSA   Serratia marcescens - MIC*    CEFAZOLIN >=64 RESISTANT Resistant     CEFEPIME <=0.12 SENSITIVE Sensitive     CEFTAZIDIME <=1 SENSITIVE Sensitive     CEFTRIAXONE <=0.25 SENSITIVE Sensitive     CIPROFLOXACIN <=0.25 SENSITIVE Sensitive     GENTAMICIN <=1 SENSITIVE Sensitive     TRIMETH/SULFA <=20 SENSITIVE Sensitive     * ABUNDANT SERRATIA MARCESCENS  CULTURE, BLOOD (ROUTINE X 2) w Reflex to ID Panel     Status: None (Preliminary result)   Collection Time: 06/05/19  5:39 PM   Specimen: BLOOD  Result Value Ref Range Status   Specimen Description BLOOD LAC  Final   Special Requests   Final    BOTTLES DRAWN AEROBIC AND ANAEROBIC Blood Culture results may not be optimal due to an excessive volume of blood received in culture bottles   Culture   Final    NO GROWTH < 24 HOURS Performed at [facility], [address] [place], [place] [address]    Report Status PENDING  Incomplete  CULTURE, BLOOD (ROUTINE X 2) w Reflex to ID Panel     Status: None (Preliminary result)   Collection Time: 06/05/19  5:46 PM   Specimen: BLOOD  Result Value Ref Range Status   Specimen Description BLOOD RAC  Final   Special  Requests   Final    BOTTLES DRAWN AEROBIC AND ANAEROBIC Blood Culture adequate volume   Culture   Final    NO GROWTH < 24 HOURS Performed at [facility], [address] [place], [place] [address]    Report Status PENDING  Incomplete    IMAGING RESULTS: I have personally reviewed the films ? Impression/Recommendation ? ? ? ___________________________________________________ Discussed with patient, requesting provider Note:  This document was prepared using Dragon voice recognition software and may include unintentional dictation errors. NAME: Nina Perez  DOB: 06-26-1940  MRN: [id number]  Date/Time: 06/06/2019 12:46 PM  REQUESTING PROVIDER Subjective:  REASON FOR CONSULT:  ? Nina Perez is a 79 y.o. with a history of Past Medical History:  Diagnosis Date  . Barrett's esophagus   . Cancer ([place])    skin ca  . Diabetes mellitus ([place])   . Gout   . History of colon cancer    adenomatous polyps  . Hypercholesterolemia   . Hypertension   . Inflammatory arthritis   . Osteoarthritis    knees, spine  . Phlebitis    x2 (with pregnancy)  . 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 Pulmonary hypertension ([place])   . Reflux esophagitis   . Rheumatoid arthritis(714.0)    positive RF, FANA, RNP, negative CCP ab and anti DNA,.  neg anti-SCL 70  . 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 ABDOMINAL AORTOGRAM W/LOWER EXTREMITY Left 05/16/2019   Procedure: ABDOMINAL AORTOGRAM W/LOWER EXTREMITY;  Surgeon: Lorretta Harp, MD;  Location: [facility];  Service: Cardiovascular;  Laterality: Left;  . AMPUTATION TOE Left 06/02/2019   Procedure: AMPUTATION TOE;  Surgeon: Albertine Patricia, DPM;  Location: [facility];  Service: Podiatry;  Laterality: Left;  . BREAST CYST ASPIRATION Left    neg  . LOWER EXTREMITY ANGIOGRAPHY Left 06/03/2019   Procedure: Lower Extremity Angiography;  Surgeon: Katha Cabal, MD;  Location: [facility];  Service: Cardiovascular;  Laterality: Left;  . PERIPHERAL VASCULAR BALLOON ANGIOPLASTY Left 05/16/2019   Procedure: PERIPHERAL VASCULAR BALLOON ANGIOPLASTY;  Surgeon: Lorretta Harp, MD;  Location: [facility];  Service: Cardiovascular;  Laterality: Left;  SFA  . VEIN LIGATION AND STRIPPING      Social History   Socioeconomic History  . Marital status: Married    Spouse name: Not on file  . Number of children: Not on file  . Years of education: Not on file  . Highest education level: Not on file  Occupational History  . Not on file  Tobacco Use  . Smoking status: Former Smoker    Packs/day: 0.50    Years: 15.00    Pack years: 7.50    Quit date: 05/12/1988    Years since quitting: 31.0  . Smokeless tobacco: Never Used  Substance and Sexual Activity  . Alcohol use: No    Alcohol/week: 0.0 standard drinks  . Drug use: No  . Sexual activity: Yes  Other Topics Concern  . Not on file  Social History Narrative  . Not on file   Social Determinants of Health   Financial Resource Strain:   . Difficulty of Paying Living Expenses: Not on file  Food Insecurity:   . Worried About Charity fundraiser in the Last Year: Not on file  . Ran Out of Food in the Last Year: Not on file  Transportation Needs:   . Lack of Transportation (Medical): Not on file  . Lack of Transportation (Non-Medical): Not on file  Physical Activity:   . Days of Exercise per Week: Not on file  . Minutes of Exercise per Session: Not on file  Stress: No Stress Concern Present  . Feeling of Stress : Only a little  Social Connections:   . Frequency of Communication with Friends and Family: Not on file  . Frequency of Social Gatherings with Friends and Family: Not on file  . Attends Religious Services: Not on file  . Active Member of Clubs or Organizations: Not on file  . Attends Archivist Meetings: Not on file  . Marital Status: Not on file   Intimate Partner Violence:   . Fear of Current or Ex-Partner: Not on file  . Emotionally Abused: Not on file  . Physically Abused: Not on file  . Sexually Abused: Not on file    Family History  Problem Relation Age of Onset  . Arthritis Mother   . Heart attack Father   . Colon cancer Neg Hx   . Breast cancer Neg Hx    Allergies  Allergen Reactions  . Maxzide [Triamterene-Hctz] Other (See Comments)    Weakness/fatigue    ID  Recent  Procedure Surgery Injections Trauma Sick contacts Travel Antibiotic use Food- raw/exotic Steroid/immune suppressants/splenectomy/Hardware Animal bites Tick exposure Water sports Fishing/hunting/animal bird exposure ? Current Facility-Administered Medications  Medication Dose Route Frequency Provider Last Rate Last Admin  . 0.9 %  sodium chloride infusion  250 mL Intravenous PRN Schnier, Dolores Lory, MD   Stopped at 06/05/19 1553  . acetaminophen (TYLENOL) tablet 650 mg  650 mg Oral Q4H PRN Ralene Muskrat B, MD   650 mg at  06/06/19 [id number]  . allopurinol (ZYLOPRIM) tablet 300 mg  300 mg Oral Daily Schnier, Dolores Lory, MD   300 mg at 06/06/19 [id number]  . amLODipine (NORVASC) tablet 10 mg  10 mg Oral Daily Schnier, Dolores Lory, MD   10 mg at 06/06/19 [id number]  . aspirin EC tablet 81 mg  81 mg Oral Daily Schnier, Dolores Lory, MD   81 mg at 06/06/19 0926  . atorvastatin (LIPITOR) tablet 80 mg  80 mg Oral q1800 Schnier, Dolores Lory, MD   80 mg at 06/05/19 1750  . ciprofloxacin (CIPRO) tablet 500 mg  500 mg Oral BID Nolberto Hanlon, MD   500 mg at 06/06/19 1228  . clopidogrel (PLAVIX) tablet 75 mg  75 mg Oral Q breakfast Schnier, Dolores Lory, MD   75 mg at 06/06/19 0926  . enoxaparin (LOVENOX) injection 40 mg  40 mg Subcutaneous Q24H Schnier, Dolores Lory, MD   40 mg at 06/05/19 2154  . folic acid (FOLVITE) tablet 1 mg  1 mg Oral QPM Schnier, Dolores Lory, MD   1 mg at 06/05/19 1750  . gabapentin (NEURONTIN) capsule 100 mg  100 mg Oral TID Katha Cabal, MD   100 mg at  06/06/19 [id number]  . hydrALAZINE (APRESOLINE) injection 5 mg  5 mg Intravenous Q20 Min PRN Schnier, Dolores Lory, MD      . insulin aspart (novoLOG) injection 0-5 Units  0-5 Units Subcutaneous QHS Schnier, Dolores Lory, MD      . insulin aspart (novoLOG) injection 0-9 Units  0-9 Units Subcutaneous TID WC Schnier, Dolores Lory, MD   1 Units at 06/03/19 1737  . labetalol (NORMODYNE) injection 10 mg  10 mg Intravenous Q10 min PRN Schnier, Dolores Lory, MD      . losartan (COZAAR) tablet 100 mg  100 mg Oral Daily Schnier, Dolores Lory, MD   100 mg at 06/06/19 [id number]  . methotrexate (RHEUMATREX) tablet 10 mg  10 mg Oral Q Wed Schnier, Dolores Lory, MD      . metoprolol succinate (TOPROL-XL) 24 hr tablet 50 mg  50 mg Oral Daily Schnier, Dolores Lory, MD   50 mg at 06/06/19 [id number]  . morphine 2 MG/ML injection 2 mg  2 mg Intravenous Q4H PRN Schnier, Dolores Lory, MD   2 mg at 06/03/19 1410  . ondansetron (ZOFRAN) injection 4 mg  4 mg Intravenous Q6H PRN Schnier, Dolores Lory, MD      . oxyCODONE (Oxy IR/ROXICODONE) immediate release tablet 5-10 mg  5-10 mg Oral Q4H PRN Schnier, Dolores Lory, MD   5 mg at 06/06/19 0452  . pantoprazole (PROTONIX) EC tablet 40 mg  40 mg Oral Daily Schnier, Dolores Lory, MD   40 mg at 06/06/19 [id number]  . polyethylene glycol (MIRALAX / GLYCOLAX) packet 17 g  17 g Oral Daily Nolberto Hanlon, MD   17 g at 06/06/19 0931  . senna-docusate (Senokot-S) tablet 1 tablet  1 tablet Oral BID Nolberto Hanlon, MD   1 tablet at 06/06/19 0926  . sodium chloride flush (NS) 0.9 % injection 3 mL  3 mL Intravenous Q12H Schnier, Dolores Lory, MD   3 mL at 06/06/19 1000  . sodium chloride flush (NS) 0.9 % injection 3 mL  3 mL Intravenous PRN Schnier, Dolores Lory, MD   3 mL at 06/06/19 0931  . spironolactone (ALDACTONE) tablet 25 mg  25 mg Oral Daily Schnier, Dolores Lory, MD   25 mg at 06/06/19 [id number]  . traMADol (ULTRAM) tablet 50 mg  50 mg Oral BID PRN Schnier, Dolores Lory, MD   50 mg at 06/03/19 [id number]     Abtx:  Anti-infectives (From admission, onward)    Start     Dose/Rate Route Frequency Ordered Stop   06/06/19 1130  ciprofloxacin (CIPRO) tablet 500 mg     500 mg Oral 2 times daily 06/06/19 1125     06/06/19 0000  ciprofloxacin (CIPRO) 500 MG tablet     500 mg Oral 2 times daily 06/06/19 1158     06/03/19 0000  ceFAZolin (ANCEF) IVPB 2g/100 mL premix    Note to Pharmacy: To be given in specials   2 g 200 mL/hr over 30 Minutes Intravenous  Once 06/02/19 2154 06/03/19 1154   06/03/19 0000  clindamycin (CLEOCIN) IVPB 300 mg  Status:  Discontinued    Note to Pharmacy: To be given in specials   300 mg 100 mL/hr over 30 Minutes Intravenous  Once 06/02/19 2154 06/03/19 1035   06/02/19 1400  ceFAZolin (ANCEF) IVPB 1 g/50 mL premix  Status:  Discontinued     1 g 100 mL/hr over 30 Minutes Intravenous Every 8 hours 06/02/19 1223 06/06/19 1125      REVIEW OF SYSTEMS:  Const: negative fever, negative chills, negative weight loss Eyes: negative diplopia or visual changes, negative eye pain ENT: negative coryza, negative sore throat Resp: negative cough, hemoptysis, dyspnea Cards: negative for chest pain, palpitations, lower extremity edema GU: negative for frequency, dysuria and hematuria GI: Negative for abdominal pain, diarrhea, bleeding, constipation Skin: negative for rash and pruritus Heme: negative for easy bruising and gum/nose bleeding MS: negative for myalgias, arthralgias, back pain and muscle weakness Neurolo:negative for headaches, dizziness, vertigo, memory problems  Psych: negative for feelings of anxiety, depression  Endocrine: negative for thyroid, diabetes Allergy/Immunology- negative for any medication or food allergies ? Pertinent Positives include : Objective:  VITALS:  BP (!) 136/57 (BP Location: Right Arm)   Pulse 79   Temp 98.2 F (36.8 C)   Resp 17   Ht 5\' 6"  (1.676 m)   Wt 101.2 kg   LMP 05/25/1988   SpO2 94%   BMI 36.01 kg/m  PHYSICAL EXAM:  General: Alert, cooperative, no distress, appears stated  age.  Head: Normocephalic, without obvious abnormality, atraumatic. Eyes: Conjunctivae clear, anicteric sclerae. Pupils are equal ENT Nares normal. No drainage or sinus tenderness. Lips, mucosa, and tongue normal. No Thrush Neck: Supple, symmetrical, no adenopathy, thyroid: non tender no carotid bruit and no JVD. Back: No CVA tenderness. Lungs: Clear to auscultation bilaterally. No Wheezing or Rhonchi. No rales. Heart: Regular rate and rhythm, no murmur, rub or gallop. Abdomen: Soft, non-tender,not distended. Bowel sounds normal. No masses Extremities: Rt knee swollen, no erythema some pain on movt Rt foot joints tender on palpation Left foot- 4th toe    Skin: No rashes or lesions. Or bruising Lymph: Cervical, supraclavicular normal. Neurologic: Grossly non-focal Pertinent Labs Lab Results CBC    Component Value Date/Time   WBC 6.0 06/04/2019 0735   RBC 4.07 06/04/2019 0735   HGB 12.6 06/04/2019 0735   HGB 13.3 04/29/2019 0917   HCT 39.8 06/04/2019 0735   HCT 41.0 04/29/2019 0917   PLT 112 (L) 06/04/2019 0735   PLT 157 04/29/2019 0917   MCV 97.8 06/04/2019 0735   MCV 97 04/29/2019 0917   MCH 31.0 06/04/2019 0735   MCHC 31.7 06/04/2019 0735   RDW 18.0 (H) 06/04/2019 0735   RDW 18.1 (H) 04/29/2019 0917   LYMPHSABS  0.9 06/22/2018 1008   MONOABS 0.4 06/22/2018 1008   EOSABS 0.1 06/22/2018 1008   BASOSABS 0.1 06/22/2018 1008    CMP Latest Ref Rng & Units 06/04/2019 06/03/2019 06/02/2019  Glucose 70 - 99 mg/dL 91 82 82  BUN 8 - 23 mg/dL 15 19 22   Creatinine 0.44 - 1.00 mg/dL 1.04(H) 1.10(H) 1.00  Sodium 135 - 145 mmol/L 135 137 136  Potassium 3.5 - 5.1 mmol/L 4.6 4.9 4.3  Chloride 98 - 111 mmol/L 98 101 103  CO2 22 - 32 mmol/L 29 28 26   Calcium 8.9 - 10.3 mg/dL 9.6 9.8 9.9  Total Protein 6.5 - 8.1 g/dL - - 6.5  Total Bilirubin 0.3 - 1.2 mg/dL - - 0.9  Alkaline Phos 38 - 126 U/L - - 55  AST 15 - 41 U/L - - 18  ALT 0 - 44 U/L - - 12      Microbiology: Recent  Results (from the past 240 hour(s))  Surgical PCR screen     Status: None   Collection Time: 06/01/19  2:38 PM   Specimen: Nasal Mucosa; Nasal Swab  Result Value Ref Range Status   MRSA, PCR NEGATIVE NEGATIVE Final   Staphylococcus aureus NEGATIVE NEGATIVE Final    Comment: (NOTE) The Xpert SA Assay (FDA approved for NASAL specimens in patients 4 years of age and older), is one component of a comprehensive surveillance program. It is not intended to diagnose infection nor to guide or monitor treatment. Performed at [facility], [address] [place], [place] [address]   Respiratory Panel by RT PCR (Flu A&B, Covid) - Nasopharyngeal Swab     Status: None   Collection Time: 06/01/19  5:15 PM   Specimen: Nasopharyngeal Swab  Result Value Ref Range Status   SARS Coronavirus 2 by RT PCR NEGATIVE NEGATIVE Final    Comment: (NOTE) SARS-CoV-2 target nucleic acids are NOT DETECTED. The SARS-CoV-2 RNA is generally detectable in upper respiratoy specimens during the acute phase of infection. The lowest concentration of SARS-CoV-2 viral copies this assay can detect is 131 copies/mL. A negative result does not preclude SARS-Cov-2 infection and should not be used as the sole basis for treatment or other patient management decisions. A negative result may occur with  improper specimen collection/handling, submission of specimen other than nasopharyngeal swab, presence of viral mutation(s) within the areas targeted by this assay, and inadequate number of viral copies (<131 copies/mL). A negative result must be combined with clinical observations, patient history, and epidemiological information. The expected result is Negative. Fact Sheet for Patients:  PinkCheek.be Fact Sheet for Healthcare Providers:  GravelBags.it This test is not yet ap proved or cleared by the [place] FDA and  has been authorized for detection  and/or diagnosis of SARS-CoV-2 by FDA under an Emergency Use Authorization (EUA). This EUA will remain  in effect (meaning this test can be used) for the duration of the COVID-19 declaration under Section 564(b)(1) of the Act, 21 U.S.C. section 360bbb-3(b)(1), unless the authorization is terminated or revoked sooner.    Influenza A by PCR NEGATIVE NEGATIVE Final   Influenza B by PCR NEGATIVE NEGATIVE Final    Comment: (NOTE) The Xpert Xpress SARS-CoV-2/FLU/RSV assay is intended as an aid in  the diagnosis of influenza from Nasopharyngeal swab specimens and  should not be used as a sole basis for treatment. Nasal washings and  aspirates are unacceptable for Xpert Xpress SARS-CoV-2/FLU/RSV  testing. Fact Sheet for Patients: PinkCheek.be Fact Sheet for Healthcare  Providers: GravelBags.it This test is not yet approved or cleared by the [place] and  has been authorized for detection and/or diagnosis of SARS-CoV-2 by  FDA under an Emergency Use Authorization (EUA). This EUA will remain  in effect (meaning this test can be used) for the duration of the  Covid-19 declaration under Section 564(b)(1) of the Act, 21  U.S.C. section 360bbb-3(b)(1), unless the authorization is  terminated or revoked. Performed at [facility], [address] [place], [place] [address]   Aerobic Culture (superficial specimen)     Status: None (Preliminary result)   Collection Time: 06/04/19 10:19 AM   Specimen: Foot; Wound  Result Value Ref Range Status   Specimen Description   Final    FOOT Performed at [facility], [address] [place], [place] [address]    Special Requests   Final    Normal Performed at [facility], [address] [place], [place] [address]    Gram Stain   Final    MODERATE WBC PRESENT, PREDOMINANTLY PMN MODERATE GRAM NEGATIVE RODS FEW GRAM POSITIVE COCCI    Culture   Final     ABUNDANT PSEUDOMONAS AERUGINOSA ABUNDANT SERRATIA MARCESCENS CULTURE REINCUBATED FOR BETTER GROWTH Performed at [facility], [place] [address] [place], [place] [address]    Report Status PENDING  Incomplete   Organism ID, Bacteria PSEUDOMONAS AERUGINOSA  Final   Organism ID, Bacteria SERRATIA MARCESCENS  Final      Susceptibility   Pseudomonas aeruginosa - MIC*    CEFTAZIDIME 4 SENSITIVE Sensitive     CIPROFLOXACIN <=0.25 SENSITIVE Sensitive     GENTAMICIN 2 SENSITIVE Sensitive     IMIPENEM 2 SENSITIVE Sensitive     PIP/TAZO 8 SENSITIVE Sensitive     CEFEPIME 2 SENSITIVE Sensitive     * ABUNDANT PSEUDOMONAS AERUGINOSA   Serratia marcescens - MIC*    CEFAZOLIN >=64 RESISTANT Resistant     CEFEPIME <=0.12 SENSITIVE Sensitive     CEFTAZIDIME <=1 SENSITIVE Sensitive     CEFTRIAXONE <=0.25 SENSITIVE Sensitive     CIPROFLOXACIN <=0.25 SENSITIVE Sensitive     GENTAMICIN <=1 SENSITIVE Sensitive     TRIMETH/SULFA <=20 SENSITIVE Sensitive     * ABUNDANT SERRATIA MARCESCENS  CULTURE, BLOOD (ROUTINE X 2) w Reflex to ID Panel     Status: None (Preliminary result)   Collection Time: 06/05/19  5:39 PM   Specimen: BLOOD  Result Value Ref Range Status   Specimen Description BLOOD LAC  Final   Special Requests   Final    BOTTLES DRAWN AEROBIC AND ANAEROBIC Blood Culture results may not be optimal due to an excessive volume of blood received in culture bottles   Culture   Final    NO GROWTH < 24 HOURS Performed at [facility], [address] [place], [place] [address]    Report Status PENDING  Incomplete  CULTURE, BLOOD (ROUTINE X 2) w Reflex to ID Panel     Status: None (Preliminary result)   Collection Time: 06/05/19  5:46 PM   Specimen: BLOOD  Result Value Ref Range Status   Specimen Description BLOOD RAC  Final   Special Requests   Final    BOTTLES DRAWN AEROBIC AND ANAEROBIC Blood Culture adequate volume   Culture   Final    NO GROWTH < 24 HOURS Performed at [facility], [address] [place], [place] [address]    Report Status PENDING  Incomplete  Pathology TOE, LEFT FOURTH; AMPUTATION:  - ULCERATION, GANGRENOUS NECROSIS, AND ACUTE SUPPURATIVE INFLAMMATION.  - ACUTE OSTEOMYELITIS.  - ACUTE INFLAMMATION PRESENT AT SOFT TISSUE MARGIN; BONY MARGIN FREE OF  ACUTE INFLAMMATION   I have personally reviewed the films ? Impression/Recommendation ? 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 Cefazolin changed to cefepime Pt is scheduled for I/D tomorrow Bone proximal marin clear of osteo so she would be okay on discharge to go on cipro . Will decide after the surgery.  Rheumatoid arthritis on MXT- hod till infection has resolved  CREST  PHT  Rt knee swelling- ? Gout  DM- on insulin  Hyperlipidemia on atorvastatin  HTN on losartan, metoprolol, aldactone .  ?pt seen with Dr.Troxler ___________________________________________________ Discussed with patient, requesting provider

## 2019-06-06 NOTE — Care Management Important Message (Signed)
Important Message  Patient Details  Name: Nina Perez MRN: [id number] Date of Birth: [birth year]   Medicare Important Message Given:  Yes     Dannette Barbara 06/06/2019, 11:15 AM

## 2019-06-06 NOTE — Progress Notes (Addendum)
Physical Therapy Treatment Patient Details Name: Nina Perez MRN: [id number] DOB: 1940-05-24 Today's Date: 06/06/2019    History of Present Illness Patient is a 79 year old female admitted with gangrene of left toe, s/p amputation of left toe 1/21 and s/p angioplasty left LE 1/22. PMH includes DM, RA, HTN, gerd, PAD.    PT Comments    Patient is able to roll to the left side today with VC for sequencing. She has difficulty with scooting to the edge of the bed to allow her feet to touch the floor. Her right leg hurts 9/10 during mobility. She attempts to transfer sit to stand with RW. She performs BLE LAQ and hip flex seated edge of bed. She is able to tolerate sitting at the edge of bed fro 15 mins. She is not able to push herself into standing due to UE weakness, trunk weakness and BLE weakness. She will  Continue to benefit from skilled PT to improve mobility and strength.   Follow Up Recommendations  SNF;Supervision/Assistance - 24 hour     Equipment Recommendations  Rolling walker with 5" wheels    Recommendations for Other Services       Precautions / Restrictions Restrictions Weight Bearing Restrictions: Yes LLE Weight Bearing: Weight bearing as tolerated    Mobility  Bed Mobility Overal bed mobility: Needs Assistance Bed Mobility: Supine to Sit;Sit to Supine     Supine to sit: Mod assist Sit to supine: Mod assist   General bed mobility comments: difficult to get to edge of bed  Transfers Overall transfer level: Needs assistance Equipment used: Rolling walker (2 wheeled) Transfers: Sit to/from Stand              Ambulation/Gait             General Gait Details: unable to ambulate   Stairs             Wheelchair Mobility    Modified Rankin (Stroke Patients Only)       Balance Overall balance assessment: Needs assistance Sitting-balance support: Feet supported Sitting balance-Leahy Scale: Fair   Postural control: Posterior lean                                   Cognition Arousal/Alertness: Awake/alert Behavior During Therapy: WFL for tasks assessed/performed Overall Cognitive Status: Within Functional Limits for tasks assessed                                        Exercises General Exercises - Lower Extremity Long Arc Quad: Strengthening;Right;Left;15 reps(3 sets of 15 reps BLE) Hip Flexion/Marching: Strengthening;Right;Left;10 reps(3 sets of 10 reps BLE)    General Comments        Pertinent Vitals/Pain      Home Living                      Prior Function            PT Goals (current goals can now be found in the care plan section) Acute Rehab PT Goals Patient Stated Goal: to go home Progress towards PT goals: Not progressing toward goals - comment    Frequency    Min 2X/week      PT Plan Current plan remains appropriate    Co-evaluation  AM-PAC PT "6 Clicks" Mobility   Outcome Measure  Help needed turning from your back to your side while in a flat bed without using bedrails?: Total Help needed moving from lying on your back to sitting on the side of a flat bed without using bedrails?: Total Help needed moving to and from a bed to a chair (including a wheelchair)?: Total Help needed standing up from a chair using your arms (e.g., wheelchair or bedside chair)?: Total Help needed to walk in hospital room?: Total Help needed climbing 3-5 steps with a railing? : Total 6 Click Score: 6    End of Session Equipment Utilized During Treatment: Gait belt Activity Tolerance: Patient limited by fatigue;Patient limited by lethargy;Patient limited by pain Patient left: in bed;with call bell/phone within reach Nurse Communication: Mobility status PT Visit Diagnosis: Muscle weakness (generalized) (M62.81);Other abnormalities of gait and mobility (R26.89);Pain;Difficulty in walking, not elsewhere classified (R26.2) Pain - Right/Left:  Left Pain - part of body: Ankle and joints of foot     Time: [id number] PT Time Calculation (min) (ACUTE ONLY): 35 min  Charges:  $Therapeutic Activity: 23-37 mins                        Alanson Puls, PT DPT 06/06/2019, 9:38 AM

## 2019-06-06 NOTE — Progress Notes (Signed)
[facility]                                                      Patient Demographics  Nina Perez, is a 79 y.o. female   MRN: [id number]   DOB - 04-21-1941  Admit Date - 06/01/2019    Outpatient Primary MD for the patient is Einar Pheasant, MD  With History of -  Past Medical History:  Diagnosis Date  . Barrett's esophagus   . Cancer ([place])    skin ca  . Diabetes mellitus ([place])   . Gout   . History of colon cancer    adenomatous polyps  . Hypercholesterolemia   . Hypertension   . Inflammatory arthritis   . Osteoarthritis    knees, spine  . Phlebitis    x2 (with pregnancy)  . 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 Pulmonary hypertension ([place])   . Reflux esophagitis   . Rheumatoid arthritis(714.0)    positive RF, FANA, RNP, negative CCP ab and anti DNA,.  neg anti-SCL 70  . 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 ABDOMINAL AORTOGRAM W/LOWER EXTREMITY Left 05/16/2019   Procedure: ABDOMINAL AORTOGRAM W/LOWER EXTREMITY;  Surgeon: Lorretta Harp, MD;  Location: [facility];  Service: Cardiovascular;  Laterality: Left;  . AMPUTATION TOE Left 06/02/2019   Procedure: AMPUTATION TOE;  Surgeon: Albertine Patricia, DPM;  Location: [facility];  Service: Podiatry;  Laterality: Left;  . BREAST CYST ASPIRATION Left    neg  . LOWER EXTREMITY ANGIOGRAPHY Left 06/03/2019   Procedure: Lower Extremity Angiography;  Surgeon: Katha Cabal, MD;  Location: [facility];  Service: Cardiovascular;  Laterality: Left;  . PERIPHERAL VASCULAR BALLOON ANGIOPLASTY Left 05/16/2019   Procedure: PERIPHERAL VASCULAR BALLOON ANGIOPLASTY;  Surgeon: Lorretta Harp, MD;  Location: [facility];  Service: Cardiovascular;  Laterality: Left;  SFA  . VEIN LIGATION AND STRIPPING      HPI  Nina Perez  is a 79 y.o. female, 4 days status post fourth edition secondary to gangrene on the left foot.  Saturday I thought she may have some drainage to the region so I cultured that area.  She has been on cefazolin.  Cultures come back with Pseudomonas and Serratia and she was switched to oral Cipro by the hospitalist.  She apparently has been running a fever some they have done blood cultures but those results are not back yet.   Social History Social History   Tobacco Use  . Smoking status: Former Smoker    Packs/day: 0.50    Years: 15.00    Pack years: 7.50    Quit date: 05/12/1988    Years since quitting: 31.0  . Smokeless tobacco: Never Used  Substance Use Topics  . Alcohol use: No    Alcohol/week: 0.0 standard drinks    Family History Family History  Problem Relation Age of Onset  . Arthritis Mother   . Heart attack Father   . Colon cancer Neg Hx   . Breast cancer Neg Hx     Prior to Admission medications   Medication Sig Start Date End Date Taking? Authorizing Provider  acetaminophen (TYLENOL) 650 MG CR tablet Take 650 mg by mouth every  4 (four) hours as needed for pain.    Yes [provider]  atorvastatin (LIPITOR) 80 MG tablet Take 1 tablet (80 mg total) by mouth daily at 6 PM. 05/17/19  Yes Cheryln Manly, NP  clopidogrel (PLAVIX) 75 MG tablet Take 1 tablet (75 mg total) by mouth daily with breakfast. 05/17/19  Yes Cheryln Manly, NP  folic acid (FOLVITE) 1 MG tablet Take 1 mg by mouth every evening.    Yes [provider]  allopurinol (ZYLOPRIM) 300 MG tablet Take 1 tablet by mouth once daily Patient taking differently: Take 300 mg by mouth daily.  03/28/19   Einar Pheasant, MD  amLODipine (NORVASC) 10 MG tablet TAKE 1 TABLET BY MOUTH ONCE DAILY Patient taking differently: Take 10 mg by mouth daily.  08/03/18   Einar Pheasant, MD  aspirin 81 MG tablet Take 81 mg by mouth daily. Reported on 07/24/2015    [provider]   ciprofloxacin (CIPRO) 500 MG tablet Take 1 tablet (500 mg total) by mouth 2 (two) times daily. 06/06/19   Nolberto Hanlon, MD  fluticasone (FLONASE) 50 MCG/ACT nasal spray Place 2 sprays into the nose daily. Patient not taking: Reported on 06/01/2019 05/25/12   Einar Pheasant, MD  gabapentin (NEURONTIN) 100 MG capsule Take 1 capsule (100 mg total) by mouth 3 (three) times daily. 05/23/19   Einar Pheasant, MD  glucose blood (BAYER CONTOUR TEST) test strip USE  STRIP TO CHECK GLUCOSE TWICE DAILY. Dx: E11.9 05/29/17   Einar Pheasant, MD  losartan (COZAAR) 100 MG tablet Take 1 tablet by mouth once daily Patient taking differently: Take 100 mg by mouth daily.  03/10/19   Einar Pheasant, MD  metFORMIN (GLUCOPHAGE) 500 MG tablet Take 1 tablet by mouth once daily Patient taking differently: Take 500 mg by mouth daily with breakfast. Give w/food. 02/21/19   Einar Pheasant, MD  methotrexate 2.5 MG tablet Take 10 mg by mouth every Wednesday.     [provider]  metoprolol succinate (TOPROL-XL) 50 MG 24 hr tablet TAKE 1 TABLET BY MOUTH ONCE DAILY IMMEDIATELY  FOLLOWING  A  MEAL 05/19/19   Einar Pheasant, MD  MICROLET LANCETS MISC Check sugar once daily, [employer] Microlet Lancets. Dx E11.9 10/02/14   Einar Pheasant, MD  mupirocin ointment (BACTROBAN) 2 % Place 1 application into the nose 2 (two) times daily. 06/06/19   Nolberto Hanlon, MD  pantoprazole (PROTONIX) 40 MG tablet Take 1 tablet (40 mg total) by mouth daily. 05/17/19   Cheryln Manly, NP  polyethylene glycol (MIRALAX / GLYCOLAX) 17 g packet Take 17 g by mouth daily. 06/07/19   Nolberto Hanlon, MD  senna-docusate (SENOKOT-S) 8.6-50 MG tablet Take 1 tablet by mouth 2 (two) times daily. 06/06/19   Nolberto Hanlon, MD  spironolactone (ALDACTONE) 25 MG tablet Take 1 tablet by mouth once daily Patient taking differently: Take 25 mg by mouth daily.  12/16/18   Einar Pheasant, MD  traMADol (ULTRAM) 50 MG tablet Take 1 tablet (50 mg total) by mouth 2 (two) times  daily as needed (pain.). 05/23/19   Einar Pheasant, MD    Anti-infectives (From admission, onward)   Start     Dose/Rate Route Frequency Ordered Stop   06/06/19 1130  ciprofloxacin (CIPRO) tablet 500 mg     500 mg Oral 2 times daily 06/06/19 1125     06/06/19 0000  ciprofloxacin (CIPRO) 500 MG tablet     500 mg Oral 2 times daily 06/06/19 1158  06/03/19 0000  ceFAZolin (ANCEF) IVPB 2g/100 mL premix    Note to Pharmacy: To be given in specials   2 g 200 mL/hr over 30 Minutes Intravenous  Once 06/02/19 2154 06/03/19 1154   06/03/19 0000  clindamycin (CLEOCIN) IVPB 300 mg  Status:  Discontinued    Note to Pharmacy: To be given in specials   300 mg 100 mL/hr over 30 Minutes Intravenous  Once 06/02/19 2154 06/03/19 1035   06/02/19 1400  ceFAZolin (ANCEF) IVPB 1 g/50 mL premix  Status:  Discontinued     1 g 100 mL/hr over 30 Minutes Intravenous Every 8 hours 06/02/19 1223 06/06/19 1125      Scheduled Meds: . allopurinol  300 mg Oral Daily  . amLODipine  10 mg Oral Daily  . aspirin EC  81 mg Oral Daily  . atorvastatin  80 mg Oral q1800  . ciprofloxacin  500 mg Oral BID  . clopidogrel  75 mg Oral Q breakfast  . enoxaparin (LOVENOX) injection  40 mg Subcutaneous Q24H  . folic acid  1 mg Oral QPM  . gabapentin  100 mg Oral TID  . insulin aspart  0-5 Units Subcutaneous QHS  . insulin aspart  0-9 Units Subcutaneous TID WC  . losartan  100 mg Oral Daily  . methotrexate  10 mg Oral Q Wed  . metoprolol succinate  50 mg Oral Daily  . pantoprazole  40 mg Oral Daily  . polyethylene glycol  17 g Oral Daily  . senna-docusate  1 tablet Oral BID  . sodium chloride flush  3 mL Intravenous Q12H  . spironolactone  25 mg Oral Daily   Continuous Infusions: . sodium chloride Stopped (06/05/19 1553)   PRN Meds:.sodium chloride, acetaminophen, hydrALAZINE, labetalol, morphine injection, ondansetron (ZOFRAN) IV, oxyCODONE, sodium chloride flush, traMADol  Allergies  Allergen Reactions  .  Maxzide [Triamterene-Hctz] Other (See Comments)    Weakness/fatigue     Physical Exam  Vitals  Blood pressure (!) 136/57, pulse 79, temperature 98.2 F (36.8 C), resp. rate 17, height 5\' 6"  (1.676 m), weight 101.2 kg, last menstrual period 05/25/1988, SpO2 94 %.  Lower Extremity exam: Dressing was changed today examination of the wound shows still some erythema and redness and there is some purulent drainage from the wound at this point.  I was able to remove the couple sutures and opened up to let it drain some more but it will need further attention to open this up and irrigate and clean it out in order to get things under control.  Data Review  CBC Recent Labs  Lab 06/01/19 1546 06/02/19 0432 06/03/19 0416 06/04/19 0735  WBC 5.0 5.2 6.6 6.0  HGB 12.1 11.7* 12.6 12.6  HCT 38.9 37.0 39.7 39.8  PLT 153 132* 120* 112*  MCV 99.0 97.6 98.3 97.8  MCH 30.8 30.9 31.2 31.0  MCHC 31.1 31.6 31.7 31.7  RDW 17.7* 17.8* 17.6* 18.0*   ------------------------------------------------------------------------------------------------------------------  Chemistries  Recent Labs  Lab 06/01/19 1546 06/02/19 0432 06/03/19 0416 06/04/19 0735  NA  --  136 137 135  K  --  4.3 4.9 4.6  CL  --  103 101 98  CO2  --  26 28 29   GLUCOSE  --  82 82 91  BUN  --  22 19 15   CREATININE 1.24* 1.00 1.10* 1.04*  CALCIUM  --  9.9 9.8 9.6  MG  --   --  1.4*  --   AST  --  18  --   --  ALT  --  12  --   --   ALKPHOS  --  55  --   --   BILITOT  --  0.9  --   --    ------Imaging results:   DG Chest Port 1 View  Result Date: 06/05/2019 CLINICAL DATA:  Fever. History of colon cancer hypertension and pulmonary fibrosis. EXAM: PORTABLE CHEST 1 VIEW COMPARISON:  06/17/2018 FINDINGS: The heart size is mildly enlarged. The pulmonary arteries are dilated. Prominent interstitial lung markings are noted which may be related to the patient's reported history of pulmonary fibrosis. There is atelectasis versus  scarring at the left lung base. There is no pneumothorax or large pleural effusion. There is no acute osseous abnormality. IMPRESSION: No active disease. Electronically Signed   By: Constance Holster M.D.   On: 06/05/2019 19:09    Assessment & Plan: I will get her in the operating room tomorrow for incision and drainage opening up and irrigation of this area.  Dr. Tama High is going to switch her over to an IV antibiotic that will hopefully be beneficial for the Serratia and Pseudomonas.  I asked her to take a look at the patient today with me.  She is not having much pain with the area and overall the left foot feels better than it did.  Her biggest problem with pain is associated with her right knee.  This is slowing down her ability to start to maneuver.  She has a history of rheumatoid arthritis and is followed by rheumatology at [facility].  Hopefully get this infection under better control with antibiotics that will hit the Serratia and Pseudomonas and also irrigation cleanup of this area.  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

## 2019-06-06 NOTE — Progress Notes (Signed)
Nutrition Brief Note  RD received consult for DM diet education on 1/20. RD attempted education on 1/21 but patient was in OR. Of note patient's HgbA1c was 5.7% on 06/01/2019 indicating an average glucose of 117 mg/dL over the past 2-3 months. In the past 24 hours her capillary glucose has been 86-108 mg/dL and she has not required any insulin. No DM diet education warranted at this time. Please re-consult RD if further nutrition issues arise.  Jacklynn Barnacle, MS, RD, LDN Office: [phone] Pager: [phone] After Hours/Weekend Pager: [phone]

## 2019-06-06 NOTE — Progress Notes (Signed)
PROGRESS NOTE    DODI WRAGGE  [id number] DOB: March 13, 1941 DOA: 06/01/2019 PCP: Einar Pheasant, MD    Brief Narrative:  79 y/o female with history of RA, HTN, DM, admitted to the hospital with gangrene of 4th left MTP. She has undergone amputation and is currently being evaluated for further vascular work up.   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 Seen by podiatry S/p amputation.  Okay for discharge from their standpoint, but now culture returned Pseudomonas and Serratia. Last fever was over 24 hours ago. IV Ancef was discontinued since it is resistant to the current bacteria. ID was consulted. Podiatry now will take patient to the OR tomorrow for incision and drainage opening and irrigation of the area. Was transition to p.o. Cipro, but will follow up with ID   Fever-afebrile for 24 hours Possibly due to #1   bcx, ucx, pending  cxr negative Follow-up IDs recommendation, Ancef was discontinued and Cipro was started   PAD Status post lower extremity angiography Status post transluminal angioplasty left anterior tibial. Successful revascularization extremities Continue on aspirin, plavix and statin Vascular signed off, recommended follow-up as outpatient  Diabetes Blood sugars stable.  Continue on SSI.  Holding oral agents  HLD  Continue on statin  Rheumatoid arthritis No signs of flare Continue on methotrexate   HTN Stable on losartan    CKD stage 3  Creatinine is currently at baseline   DVT prophylaxis: lovenox Code Status: full code Family Communication: None Disposition Plan: Husband  and patient would like to go home instead of SNF.  Will need PT, OT, nurses aide, RN and social work upon discharge.  Currently not stable to be discharged home as she is going for OR tomorrow.   Consultants:   Podiatry  Vascular surgery  Procedures:   1/21: Amputation fourth toe to MTP joint left foot  Antimicrobials:   Ancef 1/21>    Subjective: Patient complaining of arthritic pain otherwise has no new complaints.  Physical therapy at bedside this AM.   Objective: Vitals:   06/05/19 1600 06/05/19 1749 06/05/19 2335 06/06/19 0809  BP:   (!) 117/48 (!) 136/57  Pulse:   68 79  Resp:   16 17  Temp: 100.1 F (37.8 C) 98.7 F (37.1 C) 98.7 F (37.1 C) 98.2 F (36.8 C)  TempSrc: Oral Oral Oral   SpO2:   93% 94%  Weight:      Height:        Intake/Output Summary (Last 24 hours) at 06/06/2019 1327 Last data filed at 06/06/2019 0900 Gross per 24 hour  Intake 520.72 ml  Output 500 ml  Net 20.72 ml   Filed Weights   06/01/19 1425 06/02/19 1107 06/04/19 0500  Weight: 102 kg 102 kg 101.2 kg    Examination:  General exam: Appears calm and comfortable, NAD sitting up in bed Respiratory system: Clear to auscultation. Respiratory effort normal.  No wheeze rales rhonchi Cardiovascular system: S1 & S2 heard, RRR.  No murmurs cks.  Gastrointestinal system: Abdomen is nondistended, soft and nontender. Normal bowel sounds heard. Central nervous system: Alert and oriented. No  focal neurological deficits. Extremities: left foot is wrapped in dressing and ace wrap, in boot, right lower extremity no edema Skin: Warm dry Psychiatry: Judgement and insight appear normal. Mood & affect appropriate.     Data Reviewed: I have personally reviewed following labs and imaging studies  CBC: Recent Labs  Lab 06/01/19 1546 06/02/19 0432 06/03/19 0416 06/04/19 0735  WBC 5.0 5.2 6.6 6.0  HGB 12.1 11.7* 12.6 12.6  HCT 38.9 37.0 39.7 39.8  MCV 99.0 97.6 98.3 97.8  PLT 153 132* 120*  [id number]*   Basic Metabolic Panel: Recent Labs  Lab 06/01/19 1546 06/02/19 0432 06/03/19 0416 06/04/19 0735  NA  --  136 137 135  K  --  4.3 4.9 4.6  CL  --  103 101 98  CO2  --  26 28 29   GLUCOSE  --  82 82 91  BUN  --  22 19 15   CREATININE 1.24* 1.00 1.10* 1.04*  CALCIUM  --  9.9 9.8 9.6  MG  --   --  1.4*  --    GFR: Estimated Creatinine Clearance: 53.6 mL/min (A) (by C-G formula based on SCr of 1.04 mg/dL (H)). Liver Function Tests: Recent Labs  Lab 06/02/19 0432  AST 18  ALT 12  ALKPHOS 55  BILITOT 0.9  PROT 6.5  ALBUMIN 3.1*   No results for input(s): LIPASE, AMYLASE in the last 168 hours. No results for input(s): AMMONIA in the last 168 hours. Coagulation Profile: Recent Labs  Lab 06/02/19 0432  INR 1.1   Cardiac Enzymes: No results for input(s): CKTOTAL, CKMB, CKMBINDEX, TROPONINI in the last 168 hours. BNP (last 3 results) No results for input(s): PROBNP in the last 8760 hours. HbA1C: No results for input(s): HGBA1C in the last 72 hours. CBG: Recent Labs  Lab 06/05/19 1134 06/05/19 1701 06/05/19 2148 06/06/19 0809 06/06/19 1134  GLUCAP 102* 108* 107* 86 94   Lipid Profile: No results for input(s): CHOL, HDL, LDLCALC, TRIG, CHOLHDL, LDLDIRECT in the last 72 hours. Thyroid Function Tests: No results for input(s): TSH, T4TOTAL, FREET4, T3FREE, THYROIDAB in the last 72 hours. Anemia Panel: No results for input(s): VITAMINB12, FOLATE, FERRITIN, TIBC, IRON, RETICCTPCT in the last 72 hours. Sepsis Labs: No results for input(s): PROCALCITON, LATICACIDVEN in the last 168 hours.  Recent Results (from the past 240 hour(s))  Surgical PCR screen     Status: None   Collection Time: 06/01/19  2:38 PM   Specimen: Nasal Mucosa; Nasal Swab  Result Value Ref Range Status   MRSA, PCR NEGATIVE NEGATIVE Final   Staphylococcus aureus NEGATIVE NEGATIVE Final    Comment: (NOTE) The Xpert SA Assay (FDA approved for NASAL specimens in patients 18 years of age and  older), is one component of a comprehensive surveillance program. It is not intended to diagnose infection nor to guide or monitor treatment. Performed at [facility], [address] [place], [place] [address]   Respiratory Panel by RT PCR (Flu A&B, Covid) - Nasopharyngeal Swab     Status: None   Collection Time: 06/01/19  5:15 PM   Specimen: Nasopharyngeal Swab  Result Value Ref Range Status   SARS Coronavirus 2 by RT PCR NEGATIVE NEGATIVE Final    Comment: (NOTE) SARS-CoV-2 target nucleic acids are NOT DETECTED. The SARS-CoV-2 RNA is generally detectable in upper respiratoy specimens during the acute phase of infection. The lowest concentration of SARS-CoV-2 viral copies this assay can detect is 131 copies/mL. A negative result does not preclude  SARS-Cov-2 infection and should not be used as the sole basis for treatment or other patient management decisions. A negative result may occur with  improper specimen collection/handling, submission of specimen other than nasopharyngeal swab, presence of viral mutation(s) within the areas targeted by this assay, and inadequate number of viral copies (<131 copies/mL). A negative result must be combined with clinical observations, patient history, and epidemiological information. The expected result is Negative. Fact Sheet for Patients:  PinkCheek.be Fact Sheet for Healthcare Providers:  GravelBags.it This test is not yet ap proved or cleared by the [place] FDA and  has been authorized for detection and/or diagnosis of SARS-CoV-2 by FDA under an Emergency Use Authorization (EUA). This EUA will remain  in effect (meaning this test can be used) for the duration of the COVID-19 declaration under Section 564(b)(1) of the Act, 21 U.S.C. section 360bbb-3(b)(1), unless the authorization is terminated or revoked sooner.    Influenza A by PCR NEGATIVE NEGATIVE Final    Influenza B by PCR NEGATIVE NEGATIVE Final    Comment: (NOTE) The Xpert Xpress SARS-CoV-2/FLU/RSV assay is intended as an aid in  the diagnosis of influenza from Nasopharyngeal swab specimens and  should not be used as a sole basis for treatment. Nasal washings and  aspirates are unacceptable for Xpert Xpress SARS-CoV-2/FLU/RSV  testing. Fact Sheet for Patients: PinkCheek.be Fact Sheet for Healthcare Providers: GravelBags.it This test is not yet approved or cleared by the [place] FDA and  has been authorized for detection and/or diagnosis of SARS-CoV-2 by  FDA under an Emergency Use Authorization (EUA). This EUA will remain  in effect (meaning this test can be used) for the duration of the  Covid-19 declaration under Section 564(b)(1) of the Act, 21  U.S.C. section 360bbb-3(b)(1), unless the authorization is  terminated or revoked. Performed at [facility], [address] [place], [place] [address]   Aerobic Culture (superficial specimen)     Status: None (Preliminary result)   Collection Time: 06/04/19 10:19 AM   Specimen: Foot; Wound  Result Value Ref Range Status   Specimen Description   Final    FOOT Performed at [facility], [address], [place], [place] [address]    Special Requests   Final    Normal Performed at [facility], [address] [place], [place] [address]    Gram Stain   Final    MODERATE WBC PRESENT, PREDOMINANTLY PMN MODERATE GRAM NEGATIVE RODS FEW GRAM POSITIVE COCCI    Culture   Final    ABUNDANT PSEUDOMONAS AERUGINOSA ABUNDANT SERRATIA MARCESCENS CULTURE REINCUBATED FOR BETTER GROWTH Performed at [facility], [place] [address] [place], [place] [address]    Report Status PENDING  Incomplete   Organism ID, Bacteria PSEUDOMONAS AERUGINOSA  Final   Organism ID, Bacteria SERRATIA MARCESCENS  Final      Susceptibility   Pseudomonas aeruginosa -  MIC*    CEFTAZIDIME 4 SENSITIVE Sensitive     CIPROFLOXACIN <=0.25 SENSITIVE Sensitive     GENTAMICIN 2 SENSITIVE Sensitive     IMIPENEM 2 SENSITIVE Sensitive     PIP/TAZO 8 SENSITIVE Sensitive     CEFEPIME 2 SENSITIVE Sensitive     * ABUNDANT PSEUDOMONAS AERUGINOSA   Serratia marcescens - MIC*    CEFAZOLIN >=64 RESISTANT Resistant     CEFEPIME <=0.12 SENSITIVE Sensitive     CEFTAZIDIME <=1 SENSITIVE Sensitive     CEFTRIAXONE <=0.25 SENSITIVE Sensitive     CIPROFLOXACIN <=0.25 SENSITIVE Sensitive  GENTAMICIN <=1 SENSITIVE Sensitive     TRIMETH/SULFA <=20 SENSITIVE Sensitive     * ABUNDANT SERRATIA MARCESCENS  CULTURE, BLOOD (ROUTINE X 2) w Reflex to ID Panel     Status: None (Preliminary result)   Collection Time: 06/05/19  5:39 PM   Specimen: BLOOD  Result Value Ref Range Status   Specimen Description BLOOD LAC  Final   Special Requests   Final    BOTTLES DRAWN AEROBIC AND ANAEROBIC Blood Culture results may not be optimal due to an excessive volume of blood received in culture bottles   Culture   Final    NO GROWTH < 24 HOURS Performed at [facility], [address] [place], [place] [address]    Report Status PENDING  Incomplete  CULTURE, BLOOD (ROUTINE X 2) w Reflex to ID Panel     Status: None (Preliminary result)   Collection Time: 06/05/19  5:46 PM   Specimen: BLOOD  Result Value Ref Range Status   Specimen Description BLOOD RAC  Final   Special Requests   Final    BOTTLES DRAWN AEROBIC AND ANAEROBIC Blood Culture adequate volume   Culture   Final    NO GROWTH < 24 HOURS Performed at [facility], [address], [place], [place] [address]    Report Status PENDING  Incomplete         Radiology Studies: DG Chest Port 1 View  Result Date: 06/05/2019 CLINICAL DATA:  Fever. History of colon cancer hypertension and pulmonary fibrosis. EXAM: PORTABLE CHEST 1 VIEW COMPARISON:  06/17/2018 FINDINGS: The heart size is mildly enlarged. The  pulmonary arteries are dilated. Prominent interstitial lung markings are noted which may be related to the patient's reported history of pulmonary fibrosis. There is atelectasis versus scarring at the left lung base. There is no pneumothorax or large pleural effusion. There is no acute osseous abnormality. IMPRESSION: No active disease. Electronically Signed   By: Constance Holster M.D.   On: 06/05/2019 19:09    Scheduled Meds: . allopurinol  300 mg Oral Daily  . amLODipine  10 mg Oral Daily  . aspirin EC  81 mg Oral Daily  . atorvastatin  80 mg Oral q1800  . ciprofloxacin  500 mg Oral BID  . clopidogrel  75 mg Oral Q breakfast  . enoxaparin (LOVENOX) injection  40 mg Subcutaneous Q24H  . folic acid  1 mg Oral QPM  . gabapentin  100 mg Oral TID  . insulin aspart  0-5 Units Subcutaneous QHS  . insulin aspart  0-9 Units Subcutaneous TID WC  . losartan  100 mg Oral Daily  . methotrexate  10 mg Oral Q Wed  . metoprolol succinate  50 mg Oral Daily  . pantoprazole  40 mg Oral Daily  . polyethylene glycol  17 g Oral Daily  . senna-docusate  1 tablet Oral BID  . sodium chloride flush  3 mL Intravenous Q12H  . spironolactone  25 mg Oral Daily   Continuous Infusions: . sodium chloride Stopped (06/05/19 1553)     LOS: 5 days    Time spent: 45 minutes with more than 50% COC    Nolberto Hanlon, MD [facility] If 7PM-7AM, please contact night-coverage [email or web address]  06/06/2019, 1:27 PM  Patient ID: MADDOX BELLEAU, female   DOB: [birth year], 79 y.o.   MRN: [id number] Patient ID: AUDRI NORIS, female   DOB: Feb 25, 1941, 79 y.o.   MRN: [id number]

## 2019-06-07 ENCOUNTER — Inpatient Hospital Stay: Payer: Medicare Other | Admitting: Anesthesiology

## 2019-06-07 ENCOUNTER — Encounter: Payer: Self-pay | Admitting: Internal Medicine

## 2019-06-07 ENCOUNTER — Encounter
Admission: AD | Disposition: A | Discharge status: Home Health | Payer: Self-pay | Source: Home / Self Care | Attending: Internal Medicine

## 2019-06-07 HISTORY — PX: INCISION AND DRAINAGE OF WOUND: SHX1803

## 2019-06-07 LAB — GLUCOSE, CAPILLARY
Glucose-Capillary: 89 mg/dL (ref 70–99)
Glucose-Capillary: 94 mg/dL (ref 70–99)
Glucose-Capillary: 85 mg/dL (ref 70–99)
Glucose-Capillary: 86 mg/dL (ref 70–99)
Glucose-Capillary: 126 mg/dL — ABNORMAL HIGH (ref 70–99)
Glucose-Capillary: 132 mg/dL — ABNORMAL HIGH (ref 70–99)

## 2019-06-07 SURGERY — IRRIGATION AND DEBRIDEMENT WOUND
Anesthesia: General | Laterality: Left

## 2019-06-07 MED ORDER — SODIUM CHLORIDE 0.9 % IV SOLN: INTRAVENOUS | Status: DC

## 2019-06-07 MED ORDER — BUPIVACAINE HCL (PF) 0.5 % IJ SOLN
INTRAMUSCULAR | Status: DC | PRN
  Administered 2019-06-07: 4 mL

## 2019-06-07 MED ORDER — BUPIVACAINE HCL (PF) 0.5 % IJ SOLN
INTRAMUSCULAR | Status: AC
  Filled 2019-06-07: qty 30

## 2019-06-07 MED ORDER — ONDANSETRON HCL 4 MG/2ML IJ SOLN
INTRAMUSCULAR | Status: AC
  Filled 2019-06-07: qty 2

## 2019-06-07 MED ORDER — DEXAMETHASONE SODIUM PHOSPHATE 10 MG/ML IJ SOLN
INTRAMUSCULAR | Status: DC | PRN
  Administered 2019-06-07: 5 mg via INTRAVENOUS

## 2019-06-07 MED ORDER — LIDOCAINE HCL (PF) 1 % IJ SOLN
INTRAMUSCULAR | Status: DC | PRN
  Administered 2019-06-07: 4 mL

## 2019-06-07 MED ORDER — FENTANYL CITRATE (PF) 100 MCG/2ML IJ SOLN
INTRAMUSCULAR | Status: AC
  Filled 2019-06-07: qty 2

## 2019-06-07 MED ORDER — PROPOFOL 500 MG/50ML IV EMUL
INTRAVENOUS | Status: AC
  Filled 2019-06-07: qty 50

## 2019-06-07 MED ORDER — SEVOFLURANE IN SOLN
RESPIRATORY_TRACT | Status: AC
  Filled 2019-06-07: qty 250

## 2019-06-07 MED ORDER — ONDANSETRON HCL 4 MG/2ML IJ SOLN
INTRAMUSCULAR | Status: DC | PRN
  Administered 2019-06-07: 4 mg via INTRAVENOUS

## 2019-06-07 MED ORDER — PHENYLEPHRINE HCL (PRESSORS) 10 MG/ML IV SOLN
INTRAVENOUS | Status: DC | PRN
  Administered 2019-06-07: 100 ug via INTRAVENOUS

## 2019-06-07 MED ORDER — FENTANYL CITRATE (PF) 100 MCG/2ML IJ SOLN: 25.0000 ug | INTRAMUSCULAR | Status: DC | PRN

## 2019-06-07 MED ORDER — LIDOCAINE HCL (CARDIAC) PF 100 MG/5ML IV SOSY
PREFILLED_SYRINGE | INTRAVENOUS | Status: DC | PRN
  Administered 2019-06-07: 100 mg via INTRAVENOUS

## 2019-06-07 MED ORDER — PROPOFOL 10 MG/ML IV BOLUS
INTRAVENOUS | Status: DC | PRN
  Administered 2019-06-07: 150 mg via INTRAVENOUS

## 2019-06-07 MED ORDER — DEXAMETHASONE SODIUM PHOSPHATE 10 MG/ML IJ SOLN
INTRAMUSCULAR | Status: AC
  Filled 2019-06-07: qty 1

## 2019-06-07 MED ORDER — FENTANYL CITRATE (PF) 100 MCG/2ML IJ SOLN
INTRAMUSCULAR | Status: DC | PRN
  Administered 2019-06-07 (×4): 25 ug via INTRAVENOUS

## 2019-06-07 MED ORDER — LIDOCAINE HCL (PF) 1 % IJ SOLN
INTRAMUSCULAR | Status: AC
  Filled 2019-06-07: qty 30

## 2019-06-07 SURGICAL SUPPLY — 44 items
BAG COUNTER SPONGE EZ (MISCELLANEOUS) ×2 IMPLANT
BNDG CONFORM 3 STRL LF (GAUZE/BANDAGES/DRESSINGS) ×2 IMPLANT
BNDG ELASTIC 3X5.8 VLCR NS LF (GAUZE/BANDAGES/DRESSINGS) ×2 IMPLANT
BNDG ELASTIC 4X5.8 VLCR NS LF (GAUZE/BANDAGES/DRESSINGS) ×2 IMPLANT
BNDG ESMARK 4X12 TAN STRL LF (GAUZE/BANDAGES/DRESSINGS) ×2 IMPLANT
BNDG GAUZE 4.5X4.1 6PLY STRL (MISCELLANEOUS) ×2 IMPLANT
CANISTER SUCT 1200ML W/VALVE (MISCELLANEOUS) ×2 IMPLANT
COVER WAND RF STERILE (DRAPES) ×2 IMPLANT
CUFF TOURN SGL QUICK 12 (TOURNIQUET CUFF) IMPLANT
CUFF TOURN SGL QUICK 18X4 (TOURNIQUET CUFF) ×1 IMPLANT
DRAPE FLUOR MINI C-ARM 54X84 (DRAPES) ×1 IMPLANT
DURAPREP 26ML APPLICATOR (WOUND CARE) ×2 IMPLANT
ELECT REM PT RETURN 9FT ADLT (ELECTROSURGICAL) ×2
ELECTRODE REM PT RTRN 9FT ADLT (ELECTROSURGICAL) ×1 IMPLANT
GAUZE PACKING IODOFORM 1/2 (PACKING) ×1 IMPLANT
GAUZE SPONGE 4X4 12PLY STRL (GAUZE/BANDAGES/DRESSINGS) ×2 IMPLANT
GAUZE XEROFORM 1X8 LF (GAUZE/BANDAGES/DRESSINGS) ×2 IMPLANT
GLOVE BIO SURGEON STRL SZ8 (GLOVE) ×2 IMPLANT
GLOVE INDICATOR 8.0 STRL GRN (GLOVE) ×2 IMPLANT
GOWN STRL REUS W/ TWL LRG LVL3 (GOWN DISPOSABLE) ×1 IMPLANT
GOWN STRL REUS W/TWL LRG LVL3 (GOWN DISPOSABLE) ×1
KIT TURNOVER KIT A (KITS) ×2 IMPLANT
LABEL OR SOLS (LABEL) ×2 IMPLANT
NDL FILTER BLUNT 18X1 1/2 (NEEDLE) ×1 IMPLANT
NDL HYPO 25X1 1.5 SAFETY (NEEDLE) ×2 IMPLANT
NEEDLE FILTER BLUNT 18X 1/2SAF (NEEDLE) ×1
NEEDLE FILTER BLUNT 18X1 1/2 (NEEDLE) ×1 IMPLANT
NEEDLE HYPO 25X1 1.5 SAFETY (NEEDLE) ×4 IMPLANT
NS IRRIG 500ML POUR BTL (IV SOLUTION) ×2 IMPLANT
PACK EXTREMITY ARMC (MISCELLANEOUS) ×2 IMPLANT
PULSAVAC PLUS IRRIG FAN TIP (DISPOSABLE) ×2
STOCKINETTE STRL 6IN 960660 (GAUZE/BANDAGES/DRESSINGS) ×2 IMPLANT
SUT ETHILON 3-0 FS-10 30 BLK (SUTURE) ×2
SUT ETHILON 4-0 (SUTURE) ×1
SUT ETHILON 4-0 FS2 18XMFL BLK (SUTURE) ×1
SUT VIC AB 3-0 SH 27 (SUTURE) ×1
SUT VIC AB 3-0 SH 27X BRD (SUTURE) ×1 IMPLANT
SUT VIC AB 4-0 FS2 27 (SUTURE) ×2 IMPLANT
SUTURE EHLN 3-0 FS-10 30 BLK (SUTURE) ×1 IMPLANT
SUTURE ETHLN 4-0 FS2 18XMF BLK (SUTURE) ×1 IMPLANT
SWAB CULTURE AMIES ANAERIB BLU (MISCELLANEOUS) ×1 IMPLANT
SYR 10ML LL (SYRINGE) ×2 IMPLANT
SYR 3ML LL SCALE MARK (SYRINGE) ×4 IMPLANT
TIP FAN IRRIG PULSAVAC PLUS (DISPOSABLE) IMPLANT

## 2019-06-07 NOTE — Transfer of Care (Signed)
Immediate Anesthesia Transfer of Care Note  Patient: KEMIRA HOLICK  Procedure(s) Performed: IRRIGATION AND DEBRIDEMENT WOUND (Left )  Patient Location: PACU  Anesthesia Type:General  Level of Consciousness: drowsy  Airway & Oxygen Therapy: Patient Spontanous Breathing and Patient connected to face mask oxygen  Post-op Assessment: Report given to RN and Post -op Vital signs reviewed and stable  Post vital signs: Reviewed and stable  Last Vitals:  Vitals Value Taken Time  BP 122/58 06/07/19 1434  Temp 37.7 C 06/07/19 1434  Pulse 66 06/07/19 1435  Resp 15 06/07/19 1435  SpO2 99 % 06/07/19 1435  Vitals shown include unvalidated device data.  Last Pain:  Vitals:   06/07/19 1434  TempSrc: Temporal  PainSc:       Patients Stated Pain Goal: 0 ([id number])  Complications: No apparent anesthesia complications

## 2019-06-07 NOTE — Anesthesia Postprocedure Evaluation (Signed)
Anesthesia Post Note  Patient: [name]  Procedure(s) Performed: IRRIGATION AND DEBRIDEMENT WOUND (Left )  Patient location during evaluation: PACU Anesthesia Type: General Level of consciousness: awake and alert Pain management: pain level controlled Vital Signs Assessment: post-procedure vital signs reviewed and stable Respiratory status: spontaneous breathing, nonlabored ventilation, respiratory function stable and patient connected to nasal cannula oxygen Cardiovascular status: blood pressure returned to baseline and stable Postop Assessment: no apparent nausea or vomiting Anesthetic complications: no     Last Vitals:  Vitals:   06/07/19 1449 06/07/19 1500  BP: 127/60 130/61  Pulse: 66 66  Resp: 15 16  Temp:  (!) 36.2 C  SpO2: 97% 96%    Last Pain:  Vitals:   06/07/19 1500  TempSrc:   PainSc: 0-No pain                 Precious Haws Simone Tuckey

## 2019-06-07 NOTE — Progress Notes (Addendum)
Physical Therapy Treatment Patient Details Name: Nina Perez MRN: [id number] DOB: 1940/09/21 Today's Date: 06/07/2019    History of Present Illness Patient is a 79 year old female admitted with gangrene of left toe, s/p amputation of left toe 1/21 and s/p angioplasty left LE 1/22. PMH includes DM, RA, HTN, gerd, PAD.    PT Comments    Pt read for session, awaiting procedure later today.  To EOB with mod a x 1.  Generally unsteady in sitting with verbal cues to obtain balance.  She is able to stand with mod a x 2 to RW.  Upon standing feels she needs to have a BM.  Sat on bed so commode could be obtained.  Transferred to/from commode with mod a x 2 and mod verbal cues for sequencing and encouragement.  She does start to sit both times before fully turned and is guided to safety with +2 assist.  Repositioned in bed with max a x 2 for comfort.  Declined further exercises due to fatigue.   Follow Up Recommendations  SNF;Supervision/Assistance - 24 hour     Equipment Recommendations  Rolling walker with 5" wheels;3in1 (PT)    Recommendations for Other Services       Precautions / Restrictions Precautions Precautions: Fall Restrictions Weight Bearing Restrictions: Yes LLE Weight Bearing: Weight bearing as tolerated    Mobility  Bed Mobility Overal bed mobility: Needs Assistance Bed Mobility: Supine to Sit;Sit to Supine     Supine to sit: Mod assist;+2 for physical assistance Sit to supine: Mod assist   General bed mobility comments: difficult to get to edge of bed  Transfers Overall transfer level: Needs assistance Equipment used: Rolling walker (2 wheeled) Transfers: Sit to/from Stand Sit to Stand: Mod assist;+2 physical assistance;+2 safety/equipment            Ambulation/Gait Ambulation/Gait assistance: Mod assist;+2 physical assistance;+2 safety/equipment Gait Distance (Feet): 2 Feet Assistive device: Rolling walker (2 wheeled) Gait Pattern/deviations: Step-to  pattern Gait velocity: decreased   General Gait Details: able to pivot to recliner but no true gait   Stairs             Wheelchair Mobility    Modified Rankin (Stroke Patients Only)       Balance Overall balance assessment: Needs assistance Sitting-balance support: Feet supported Sitting balance-Leahy Scale: Fair   Postural control: Posterior lean Standing balance support: During functional activity;Bilateral upper extremity supported Standing balance-Leahy Scale: Poor Standing balance comment: limited standing tolerance, requires B UE support                            Cognition Arousal/Alertness: Awake/alert Behavior During Therapy: WFL for tasks assessed/performed Overall Cognitive Status: Within Functional Limits for tasks assessed                                        Exercises Other Exercises Other Exercises: to commode to void and small loose liquid BM    General Comments        Pertinent Vitals/Pain Pain Assessment: Faces Faces Pain Scale: Hurts a little bit Pain Location: General arthritic stiffness from immobility Pain Descriptors / Indicators: Aching;Sore Pain Intervention(s): Monitored during session;Limited activity within patient's tolerance;Repositioned    Home Living  Prior Function            PT Goals (current goals can now be found in the care plan section) Progress towards PT goals: Progressing toward goals    Frequency    Min 2X/week      PT Plan Current plan remains appropriate    Co-evaluation              AM-PAC PT "6 Clicks" Mobility   Outcome Measure  Help needed turning from your back to your side while in a flat bed without using bedrails?: Total Help needed moving from lying on your back to sitting on the side of a flat bed without using bedrails?: Total Help needed moving to and from a bed to a chair (including a wheelchair)?: A Lot Help needed  standing up from a chair using your arms (e.g., wheelchair or bedside chair)?: A Lot Help needed to walk in hospital room?: Total Help needed climbing 3-5 steps with a railing? : Total 6 Click Score: 8    End of Session Equipment Utilized During Treatment: Gait belt Activity Tolerance: Patient tolerated treatment well Patient left: in bed;with call bell/phone within reach;with bed alarm set Nurse Communication: Mobility status Pain - Right/Left: Left Pain - part of body: Ankle and joints of foot     Time: [id number] PT Time Calculation (min) (ACUTE ONLY): 18 min  Charges:  $Therapeutic Activity: 8-22 mins                    Chesley Noon, PTA 06/07/19, 9:36 AM

## 2019-06-07 NOTE — Anesthesia Preprocedure Evaluation (Addendum)
Anesthesia Evaluation  Patient identified by MRN, date of birth, ID band Patient awake    Reviewed: Allergy & Precautions, H&P , NPO status , Patient's Chart, lab work & pertinent test results  Airway Mallampati: II  TM Distance: >3 FB Neck ROM: full    Dental  (+) Edentulous Upper, Missing,    Pulmonary shortness of breath and Long-Term Oxygen Therapy, former smoker,  unclear indication for home O2, wears 2L/min PRN SpO2 95% room air in pre-op          Cardiovascular hypertension, + Peripheral Vascular Disease    Mild pulmonary hypertension  Echo April 2019: NORMAL LEFT VENTRICULAR SYSTOLIC FUNCTION WITH AN ESTIMATED EF = 55 % NORMAL RIGHT VENTRICULAR SYSTOLIC FUNCTION MILD TRICSUPID AND MITRAL VALVE INSUFFICIENCY TRACE AORTIC VALVE INSUFFICIENCY NO VALVULAR STENOSIS MILD RV ENLARGEMENT MILD BIATRIAL ENLARGEMENT MILD LVH PARADOXICAL SEPTAL WALL MOTION CONSISTENT WITH PROBABLE LEFT BUNDLE BRANCH BLOCK   Neuro/Psych negative neurological ROS  negative psych ROS   GI/Hepatic Neg liver ROS, GERD  Medicated and Controlled,  Endo/Other  diabetesMorbid obesity  Renal/GU CRFRenal disease  negative genitourinary   Musculoskeletal   Abdominal   Peds  Hematology negative hematology ROS (+)   Anesthesia Other Findings Obesity  Past Medical History: No date: Barrett's esophagus No date: Cancer (Major)     Comment:  skin ca No date: Diabetes mellitus (HCC) No date: Gout No date: History of colon cancer     Comment:  adenomatous polyps No date: Hypercholesterolemia No date: Hypertension No date: Inflammatory arthritis No date: Osteoarthritis     Comment:  knees, spine No date: Phlebitis     Comment:  x2 (with pregnancy) No date: Pulmonary fibrosis (HCC)     Comment:  mild No date: Pulmonary hypertension (HCC) No date: Reflux esophagitis No date: Rheumatoid arthritis(714.0)     Comment:  positive RF, FANA, RNP,  negative CCP ab and anti DNA,.                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, sclerodactyly, telangiectasias  Past Surgical History: 05/16/2019: ABDOMINAL AORTOGRAM W/LOWER EXTREMITY; Left     Comment:  Procedure: ABDOMINAL AORTOGRAM W/LOWER EXTREMITY;                Surgeon: Lorretta Harp, MD;  Location: [facility];  Service: Cardiovascular;  Laterality: Left; 06/02/2019: AMPUTATION TOE; Left     Comment:  Procedure: AMPUTATION TOE;  Surgeon: Albertine Patricia,               DPM;  Location: [facility];  Service: Podiatry;                Laterality: Left; No date: BREAST CYST ASPIRATION; Left     Comment:  neg 06/03/2019: LOWER EXTREMITY ANGIOGRAPHY; Left     Comment:  Procedure: Lower Extremity Angiography;  Surgeon:               Katha Cabal, MD;  Location: [facility];               Service: Cardiovascular;  Laterality: Left; 05/16/2019: PERIPHERAL VASCULAR BALLOON ANGIOPLASTY; Left     Comment:  Procedure: PERIPHERAL VASCULAR BALLOON ANGIOPLASTY;                Surgeon: Lorretta Harp, MD;  Location: [facility];  Service: Cardiovascular;  Laterality: Left;  SFA No date: VEIN LIGATION AND STRIPPING  BMI    Body Mass Index: 36.01 kg/m      Reproductive/Obstetrics negative OB ROS                          Anesthesia Physical Anesthesia Plan  ASA: III  Anesthesia Plan: General and General LMA   Post-op Pain Management:    Induction: Intravenous  PONV Risk Score and Plan: Propofol infusion and TIVA  Airway Management Planned: LMA  Additional Equipment:   Intra-op Plan:   Post-operative Plan: Extubation in OR  Informed Consent: I have reviewed the patients History and Physical, chart, labs and discussed the procedure including the risks, benefits and alternatives for the proposed anesthesia with the patient or authorized representative who has indicated  his/her understanding and acceptance.     Dental Advisory Given  Plan Discussed with: Anesthesiologist  Anesthesia Plan Comments: (Patient consented for risks of anesthesia including but not limited to:  - adverse reactions to medications - damage to teeth, lips or other oral mucosa - sore throat or hoarseness - Damage to heart, brain, lungs or loss of life  Patient voiced understanding.)      Anesthesia Quick Evaluation

## 2019-06-07 NOTE — H&P (Signed)
H and P has been reviewed.Developed po infection.  Antibiotics changed and followed by ID.

## 2019-06-07 NOTE — Anesthesia Procedure Notes (Signed)
Procedure Name: LMA Insertion Date/Time: 06/07/2019 1:46 PM Performed by: Johnna Acosta, CRNA Pre-anesthesia Checklist: Patient identified, Patient being monitored, Timeout performed, Emergency Drugs available and Suction available Patient Re-evaluated:Patient Re-evaluated prior to induction Oxygen Delivery Method: Circle system utilized Preoxygenation: Pre-oxygenation with 100% oxygen Induction Type: IV induction Ventilation: Mask ventilation without difficulty LMA: LMA inserted LMA Size: 4.5 Tube type: Oral Number of attempts: 1 Placement Confirmation: positive ETCO2 and breath sounds checked- equal and bilateral Tube secured with: Tape Dental Injury: Teeth and Oropharynx as per pre-operative assessment

## 2019-06-07 NOTE — Op Note (Signed)
Operative note   Surgeon: Dr. Albertine Patricia, DPM.    Assistant: None    Preop diagnosis: Postop wound infection left foot    Postop diagnosis: Postop wound infection with hematoma left foot    Procedure:   1.  And the abscess and hematoma secondary to postop wound infection left foot          EBL: 15 cc    Anesthesia:general delivered by the anesthesia team.  I injected 8 cc of lidocaine Marcaine mix preop and 10 cc 0.5% Marcaine plain postop    Hemostasis: None    Specimen: I did a deep wound culture aerobic and anaerobic.    Complications: None    Operative indications: Postop drainage that cultured a Serratia as well as a Pseudomonas    Procedure:  Patient was brought into the OR and placed on the operating table in thesupine position. After anesthesia was obtained theleft lower extremity was prepped and draped in usual sterile fashion.  Operative Report: This time attention directed to the left foot where previous incision had been made to amputate the fourth toe at the metatarsophalangeal joint level.  Sutures removed and the areas opened up with a hemostat.  Some subcutaneous sutures were removed at this timeframe.  Yesterday and the patient's room I did remove a couple sutures and was able to express some purulence from the region.  After opening up the wound that seem to be significantly better today I saw no evidence of any purulence or infectious drainage.  There was evidence of hematoma in the region there were some coagulated segments of red blood cells that I was able to irrigate and removed.  I compress the foot well and removed any residual hematoma present in the area.  At this point the area was explored for any sinus tracts or pockets which I saw none of.  There was an copiously irrigated with 500 cc of pulse lavage normal saline.  The area was then cleaned and sponged and the tissues looked healthy and stable with no evidence of infection or necrotic tissue.  At  this point I used 3 simple interrupted 3-0 Vicryl sutures to close some of the deep fascial and capsular tissue.  Next I used 3-0 nylon simple erupted sutures to close 75% of the wound.  I packed a portion of the wound dorsally with iodoform gauze to allow for drainage to the region until the bleeding was controlled.  I follow this with a sterile compressive dressing consisting of Xeroform gauze 4 x 4's conformer Kerlix and an Ace wrap.    Patient tolerated the procedure and anesthesia well.  Was transported from the OR to the PACU with all vital signs stable and vascular status intact. To be discharged per routine protocol.  Will follow up in approximately 1 week in the outpatient clinic.

## 2019-06-07 NOTE — Progress Notes (Signed)
PROGRESS NOTE    Nina Perez  [id number] DOB: 09/08/1940 DOA: 06/01/2019 PCP: Einar Pheasant, MD    Brief Narrative:  79 y/o female with history of RA, HTN, DM, admitted to the hospital with gangrene of 4th left MTP. She has undergone amputation and is currently being evaluated for further vascular work up.   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 Seen by podiatry S/p amputation. -Culture with Pseudomonas and Serratia. Podiatry will take patient to the OR today IV Ancef was discontinued since it is resistant to the current bacteria. ID following-started on cefepime   Fever-afebrile for 24 hours Possibly due to #1   bcx, ucx, pending  cxr negative Continue on IV cefepime   PAD Status post lower extremity angiography Status post transluminal angioplasty left anterior tibial. Successful revascularization extremities Continue on aspirin, plavix and statin Vascular signed off, recommended follow-up as outpatient  Diabetes Blood sugars stable.  Continue on SSI.  Holding oral agents  HLD  Continue on statin  Rheumatoid arthritis No signs of flare Continue on methotrexate   HTN Stable on losartan    CKD stage 3  Creatinine is currently at baseline   DVT prophylaxis: lovenox Code Status: full code Family Communication: None Disposition Plan: Husband and patient would like to go home instead of SNF.  Will need PT, OT, nurses aide, RN and social work upon discharge.  Plan to  OR today as mentioned above.       Consultants:   Podiatry  Vascular surgery  Procedures:   1/21: Amputation  fourth toe to MTP joint left foot  Antimicrobials:   Ancef 1/21> 1/25 Cefepime 1/25  Subjective: Patient seen and examined this a.m..  Has no new complaints denies fever, chills, shortness of breath or any other symptoms  Objective: Vitals:   06/05/19 2335 06/06/19 0809 06/06/19 1641 06/06/19 2253  BP: (!) 117/48 (!) 136/57 134/82 (!) 130/58  Pulse: 68 79 73 91  Resp: 16 17 17 16   Temp: 98.7 F (37.1 C) 98.2 F (36.8 C) 98.2 F (36.8 C) 98.9 F (37.2 C)  TempSrc: Oral  Oral   SpO2: 93% 94% 97% 93%  Weight:      Height:        Intake/Output Summary (Last 24 hours) at 06/07/2019 0737 Last data filed at 06/07/2019 0540 Gross per 24 hour  Intake 820 ml  Output 400 ml  Net 420 ml   Filed Weights   06/01/19 1425 06/02/19 1107 06/04/19 0500  Weight: 102 kg 102 kg 101.2 kg    Examination:  General exam: Appears calm and comfortable, lying in bed, nad Respiratory system: Clear to auscultation. Respiratory effort normal.  No wheeze rales rhonchi Cardiovascular system: S1 & S2 heard, RRR.  No murmurs cks.  Gastrointestinal system: Abdomen is nondistended, soft and nontender. Normal bowel sounds heard. Central nervous system: Alert and oriented. No focal neurological deficits. Extremities: left foot is wrapped in dressing and ace wrap, in boot, right lower extremity no edema or cyanosis Skin: Warm dry Psychiatry: Judgement and insight appear normal. Mood & affect appropriate.     Data Reviewed:  I have personally reviewed following labs and imaging studies  CBC: Recent Labs  Lab 06/01/19 1546 06/02/19 0432 06/03/19 0416 06/04/19 0735  WBC 5.0 5.2 6.6 6.0  HGB 12.1 11.7* 12.6 12.6  HCT 38.9 37.0 39.7 39.8  MCV 99.0 97.6 98.3 97.8  PLT 153 132* 120* [id number]*   Basic Metabolic Panel: Recent Labs  Lab 06/01/19 1546 06/02/19 0432 06/03/19 0416 06/04/19 0735  NA  --  136 137 135  K  --  4.3 4.9 4.6  CL  --  103 101 98  CO2  --  26 28 29   GLUCOSE  --  82 82 91    BUN  --  22 19 15   CREATININE 1.24* 1.00 1.10* 1.04*  CALCIUM  --  9.9 9.8 9.6  MG  --   --  1.4*  --    GFR: Estimated Creatinine Clearance: 53.6 mL/min (A) (by C-G formula based on SCr of 1.04 mg/dL (H)). Liver Function Tests: Recent Labs  Lab 06/02/19 0432  AST 18  ALT 12  ALKPHOS 55  BILITOT 0.9  PROT 6.5  ALBUMIN 3.1*   No results for input(s): LIPASE, AMYLASE in the last 168 hours. No results for input(s): AMMONIA in the last 168 hours. Coagulation Profile: Recent Labs  Lab 06/02/19 0432  INR 1.1   Cardiac Enzymes: No results for input(s): CKTOTAL, CKMB, CKMBINDEX, TROPONINI in the last 168 hours. BNP (last 3 results) No results for input(s): PROBNP in the last 8760 hours. HbA1C: No results for input(s): HGBA1C in the last 72 hours. CBG: Recent Labs  Lab 06/06/19 0809 06/06/19 1134 06/06/19 1642 06/06/19 1759 06/06/19 2254  GLUCAP 86 94 70 124* 86   Lipid Profile: No results for input(s): CHOL, HDL, LDLCALC, TRIG, CHOLHDL, LDLDIRECT in the last 72 hours. Thyroid Function Tests: No results for input(s): TSH, T4TOTAL, FREET4, T3FREE, THYROIDAB in the last 72 hours. Anemia Panel: No results for input(s): VITAMINB12, FOLATE, FERRITIN, TIBC, IRON, RETICCTPCT in the last 72 hours. Sepsis Labs: No results for input(s): PROCALCITON, LATICACIDVEN in the last 168 hours.  Recent Results (from the past 240 hour(s))  Surgical PCR screen     Status: None   Collection Time: 06/01/19  2:38 PM   Specimen: Nasal Mucosa; Nasal Swab  Result Value Ref Range Status   MRSA, PCR NEGATIVE NEGATIVE Final   Staphylococcus aureus NEGATIVE NEGATIVE Final    Comment: (NOTE) The Xpert SA Assay (FDA approved for NASAL specimens in patients 49 years of age and older), is one component of a comprehensive surveillance program. It is not intended to diagnose infection nor to guide or monitor treatment. Performed at [facility], [address] [place], [place]  [address]   Respiratory Panel by RT PCR (Flu A&B, Covid) - Nasopharyngeal Swab     Status: None   Collection Time: 06/01/19  5:15 PM   Specimen: Nasopharyngeal Swab  Result Value Ref Range Status   SARS Coronavirus 2 by RT PCR NEGATIVE NEGATIVE Final    Comment: (NOTE) SARS-CoV-2 target nucleic acids are NOT DETECTED. The SARS-CoV-2 RNA is generally detectable in upper respiratoy specimens during the acute phase of infection. The lowest concentration of SARS-CoV-2 viral copies this assay can detect is 131 copies/mL. A negative result does not preclude SARS-Cov-2 infection and should not be used as the sole basis for treatment or other patient management decisions. A negative result may occur with  improper specimen collection/handling, submission of specimen other than nasopharyngeal swab, presence of viral  mutation(s) within the areas targeted by this assay, and inadequate number of viral copies (<131 copies/mL). A negative result must be combined with clinical observations, patient history, and epidemiological information. The expected result is Negative. Fact Sheet for Patients:  PinkCheek.be Fact Sheet for Healthcare Providers:  GravelBags.it This test is not yet ap proved or cleared by the [place] FDA and  has been authorized for detection and/or diagnosis of SARS-CoV-2 by FDA under an Emergency Use Authorization (EUA). This EUA will remain  in effect (meaning this test can be used) for the duration of the COVID-19 declaration under Section 564(b)(1) of the Act, 21 U.S.C. section 360bbb-3(b)(1), unless the authorization is terminated or revoked sooner.    Influenza A by PCR NEGATIVE NEGATIVE Final   Influenza B by PCR NEGATIVE NEGATIVE Final    Comment: (NOTE) The Xpert Xpress SARS-CoV-2/FLU/RSV assay is intended as an aid in  the diagnosis of influenza from Nasopharyngeal swab specimens and  should not be used as a  sole basis for treatment. Nasal washings and  aspirates are unacceptable for Xpert Xpress SARS-CoV-2/FLU/RSV  testing. Fact Sheet for Patients: PinkCheek.be Fact Sheet for Healthcare Providers: GravelBags.it This test is not yet approved or cleared by the [place] FDA and  has been authorized for detection and/or diagnosis of SARS-CoV-2 by  FDA under an Emergency Use Authorization (EUA). This EUA will remain  in effect (meaning this test can be used) for the duration of the  Covid-19 declaration under Section 564(b)(1) of the Act, 21  U.S.C. section 360bbb-3(b)(1), unless the authorization is  terminated or revoked. Performed at [facility], [address] [place], [place] [address]   Aerobic Culture (superficial specimen)     Status: None (Preliminary result)   Collection Time: 06/04/19 10:19 AM   Specimen: Foot; Wound  Result Value Ref Range Status   Specimen Description   Final    FOOT Performed at [facility], [address] [place], [place] [address]    Special Requests   Final    Normal Performed at [facility], [address] [place], [place] [address]    Gram Stain   Final    MODERATE WBC PRESENT, PREDOMINANTLY PMN MODERATE GRAM NEGATIVE RODS FEW GRAM POSITIVE COCCI    Culture   Final    ABUNDANT PSEUDOMONAS AERUGINOSA ABUNDANT SERRATIA MARCESCENS CULTURE REINCUBATED FOR BETTER GROWTH Performed at [facility], [place] [address] [place],  [address]    Report Status PENDING  Incomplete   Organism ID, Bacteria PSEUDOMONAS AERUGINOSA  Final   Organism ID, Bacteria SERRATIA MARCESCENS  Final      Susceptibility   Pseudomonas aeruginosa - MIC*    CEFTAZIDIME 4 SENSITIVE Sensitive     CIPROFLOXACIN <=0.25 SENSITIVE Sensitive     GENTAMICIN 2 SENSITIVE Sensitive     IMIPENEM 2 SENSITIVE Sensitive     PIP/TAZO 8 SENSITIVE Sensitive     CEFEPIME 2 SENSITIVE  Sensitive     * ABUNDANT PSEUDOMONAS AERUGINOSA   Serratia marcescens - MIC*    CEFAZOLIN >=64 RESISTANT Resistant     CEFEPIME <=0.12 SENSITIVE Sensitive     CEFTAZIDIME <=1 SENSITIVE Sensitive     CEFTRIAXONE <=0.25 SENSITIVE Sensitive     CIPROFLOXACIN <=0.25 SENSITIVE Sensitive     GENTAMICIN <=1 SENSITIVE Sensitive     TRIMETH/SULFA <=20 SENSITIVE Sensitive     * ABUNDANT SERRATIA MARCESCENS  CULTURE, BLOOD (ROUTINE X 2) w Reflex to ID Panel  Status: None (Preliminary result)   Collection Time: 06/05/19  5:39 PM   Specimen: BLOOD  Result Value Ref Range Status   Specimen Description BLOOD LAC  Final   Special Requests   Final    BOTTLES DRAWN AEROBIC AND ANAEROBIC Blood Culture results may not be optimal due to an excessive volume of blood received in culture bottles   Culture   Final    NO GROWTH 2 DAYS Performed at [facility], [address] [place], [place] [address]    Report Status PENDING  Incomplete  CULTURE, BLOOD (ROUTINE X 2) w Reflex to ID Panel     Status: None (Preliminary result)   Collection Time: 06/05/19  5:46 PM   Specimen: BLOOD  Result Value Ref Range Status   Specimen Description BLOOD RAC  Final   Special Requests   Final    BOTTLES DRAWN AEROBIC AND ANAEROBIC Blood Culture adequate volume   Culture   Final    NO GROWTH 2 DAYS Performed at [facility], [address], [place],  [address]    Report Status PENDING  Incomplete         Radiology Studies: DG Chest Port 1 View  Result Date: 06/05/2019 CLINICAL DATA:  Fever. History of colon cancer hypertension and pulmonary fibrosis. EXAM: PORTABLE CHEST 1 VIEW COMPARISON:  06/17/2018 FINDINGS: The heart size is mildly enlarged. The pulmonary arteries are dilated. Prominent interstitial lung markings are noted which may be related to the patient's reported history of pulmonary fibrosis. There is atelectasis versus scarring at the left lung base. There is no  pneumothorax or large pleural effusion. There is no acute osseous abnormality. IMPRESSION: No active disease. Electronically Signed   By: Constance Holster M.D.   On: 06/05/2019 19:09    Scheduled Meds: . allopurinol  300 mg Oral Daily  . amLODipine  10 mg Oral Daily  . aspirin EC  81 mg Oral Daily  . atorvastatin  80 mg Oral q1800  . clopidogrel  75 mg Oral Q breakfast  . docusate sodium  100 mg Oral BID  . enoxaparin (LOVENOX) injection  40 mg Subcutaneous Q24H  . folic acid  1 mg Oral QPM  . gabapentin  100 mg Oral TID  . insulin aspart  0-5 Units Subcutaneous QHS  . insulin aspart  0-9 Units Subcutaneous TID WC  . losartan  100 mg Oral Daily  . methotrexate  10 mg Oral Q Wed  . metoprolol succinate  50 mg Oral Daily  . pantoprazole  40 mg Oral Daily  . polyethylene glycol  17 g Oral Daily  . senna-docusate  1 tablet Oral BID  . sodium chloride flush  3 mL Intravenous Q12H  . spironolactone  25 mg Oral Daily   Continuous Infusions: . sodium chloride Stopped (06/05/19 1553)  . ceFEPime (MAXIPIME) IV 2 g (06/06/19 2256)     LOS: 6 days    Time spent: 45 minutes with more than 50% COC    Nolberto Hanlon, MD [facility] If 7PM-7AM, please contact night-coverage [email or web address]  06/07/2019, 7:37 AM  Patient ID: CARMEN WHERLEY, female   DOB: [birth year], 79 y.o.   MRN: [id number]

## 2019-06-08 ENCOUNTER — Ambulatory Visit: Payer: Medicare Other | Admitting: Internal Medicine

## 2019-06-08 DIAGNOSIS — L02612 Cutaneous abscess of left foot: Secondary | ICD-10-CM

## 2019-06-08 DIAGNOSIS — D696 Thrombocytopenia, unspecified: Secondary | ICD-10-CM

## 2019-06-08 DIAGNOSIS — I96 Gangrene, not elsewhere classified: Secondary | ICD-10-CM

## 2019-06-08 LAB — CREATININE, SERUM
Creatinine, Ser: 0.98 mg/dL (ref 0.44–1.00)
GFR calc Af Amer: >60 mL/min
GFR calc non Af Amer: 55 mL/min — ABNORMAL LOW

## 2019-06-08 LAB — BASIC METABOLIC PANEL
Anion gap: 9 (ref 5–15)
BUN: 34 mg/dL — ABNORMAL HIGH (ref 8–23)
CO2: 23 mmol/L (ref 22–32)
Calcium: 9.6 mg/dL (ref 8.9–10.3)
Chloride: 100 mmol/L (ref 98–111)
Creatinine, Ser: 0.83 mg/dL (ref 0.44–1.00)
GFR calc Af Amer: >60 mL/min (ref >60)
GFR calc non Af Amer: >60 mL/min (ref >60)
Glucose, Bld: 134 mg/dL — ABNORMAL HIGH (ref 70–99)
Potassium: 5.1 mmol/L (ref 3.5–5.1)
Sodium: 132 mmol/L — ABNORMAL LOW (ref 135–145)

## 2019-06-08 LAB — CBC
HCT: 38.5 % (ref 36.0–46.0)
Hemoglobin: 12.3 g/dL (ref 12.0–15.0)
MCH: 31.1 pg (ref 26.0–34.0)
MCHC: 31.9 g/dL (ref 30.0–36.0)
MCV: 97.2 fL (ref 80.0–100.0)
Platelets: 196 10*3/uL (ref 150–400)
RBC: 3.96 MIL/uL (ref 3.87–5.11)
RDW: 17 % — ABNORMAL HIGH (ref 11.5–15.5)
WBC: 8.5 10*3/uL (ref 4.0–10.5)
nRBC: 0 % (ref 0.0–0.2)

## 2019-06-08 LAB — GLUCOSE, CAPILLARY
Glucose-Capillary: 101 mg/dL — ABNORMAL HIGH (ref 70–99)
Glucose-Capillary: 103 mg/dL — ABNORMAL HIGH (ref 70–99)
Glucose-Capillary: 110 mg/dL — ABNORMAL HIGH (ref 70–99)
Glucose-Capillary: 130 mg/dL — ABNORMAL HIGH (ref 70–99)
Glucose-Capillary: 154 mg/dL — ABNORMAL HIGH (ref 70–99)
Glucose-Capillary: 164 mg/dL — ABNORMAL HIGH (ref 70–99)

## 2019-06-08 MED ORDER — SODIUM CHLORIDE 0.9 % IV SOLN
2.0000 g | Freq: Three times a day (TID) | INTRAVENOUS | Status: DC
  Administered 2019-06-08 – 2019-06-09 (×3): 2 g via INTRAVENOUS
  Filled 2019-06-08 (×6): qty 2

## 2019-06-08 NOTE — Progress Notes (Signed)
Physical Therapy Treatment Patient Details Name: Nina Perez MRN: [id number] DOB: 12/21/1940 Today's Date: 06/08/2019    History of Present Illness Patient is a 79 year old female admitted with gangrene of left toe, s/p amputation of left toe 1/21 and s/p angioplasty left LE 1/22. PMH includes DM, RA, HTN, gerd, PAD.    PT Comments    Pt was supine in bed upon arriving with spouse present and RN just issuing pain meds. Pt reports 6/10 L foot pain but was agreeable and cooperative with PT. Pt performed bed mobility supine (HOB elevated) to short sit with increased time and max assist to achieve EOB short sit. Pt strength/ pain limited pt's ability to perform. Pt sat EOB with darco post op shoe x several minutes prior to transfer sit to stand. Pt stood 3 x ~ 1 min EOB  with max assist from elevated bed surface. She had BM while standing EOB.  Pt unable to fully stand upright this date and unsafe to progress to recliner/ambulate. Pt required max assist to return to supine and be repositioned in bed. She performed supine excises in bed to promote BLEs strength. Overall tolerated exercises well but present with weakness throughout. O2 and HR WFL throughout session. Pt was repositioned in bed with bed alarm set, call bell in reach, and spouse at bedside.  Lengthy discussion with pt/spouse about d/c disposition. Therapist recommends pt d/c to SNF for safety and improve strength and mobility prior to returning home. Pt/spouse would prefer  to d/c home with 24 hour supervision and [place] services. Therapist will discuss with CM d/c planning.    Follow Up Recommendations  SNF;Supervision/Assistance - 24 hour     Equipment Recommendations  Rolling walker with 5" wheels;3in1 (PT)    Recommendations for Other Services       Precautions / Restrictions Precautions Precautions: Fall Required Braces or Orthoses: (darco) Restrictions Weight Bearing Restrictions: Yes LLE Weight Bearing: Weight bearing as  tolerated Other Position/Activity Restrictions: Darco post op note    Mobility  Bed Mobility Overal bed mobility: Needs Assistance Bed Mobility: Supine to Sit;Sit to Supine     Supine to sit: Mod assist;Max assist(+1) Sit to supine: Mod assist;Max assist;HOB elevated   General bed mobility comments: pt required mod/max assist of one supine to short sit EOB. spouse present throughout session. pt does not have adjustible bed at home.  She required assistance with BLEs and trunk to achieve EOB sitting. Pt required max assist to short sit to supine with therapist supporting BLEs into bed. unable to slide to [facility]. spouse assisted therapist with moving pt to [facility]  Transfers Overall transfer level: Needs assistance Equipment used: Rolling walker (2 wheeled) Transfers: Sit to/from Stand Sit to Stand: Max assist         General transfer comment: pt performed STS 3 x EOB with bed height elevated and gait belt used for safety. She required max assist + vcs to fully clear bottom off EOB. poor standing posture. pt only tolerated standing x 1 minutes prior to fatigue and needing to sit.   Ambulation/Gait             General Gait Details: Unable to ambulate 2/2 to safety concerns and strength deficits   Stairs             Wheelchair Mobility    Modified Rankin (Stroke Patients Only)       Balance Overall balance assessment: Needs assistance Sitting-balance support: Feet supported Sitting balance-Leahy Scale:  Fair Sitting balance - Comments: pt able to sit EOB with BLEs on floor and UE support x several minutes with CGA for safety.  Postural control: Posterior lean Standing balance support: Bilateral upper extremity supported Standing balance-Leahy Scale: Poor Standing balance comment: pt stood 3 x EOB but required assistance to maintaining standing 2/2 to strength deficits.                            Cognition Arousal/Alertness: Awake/alert Behavior  During Therapy: WFL for tasks assessed/performed Overall Cognitive Status: Within Functional Limits for tasks assessed                                 General Comments: pt A and O x 4      Exercises General Exercises - Lower Extremity Ankle Circles/Pumps: AROM;Both;Supine;10 reps Quad Sets: AROM;Both;Supine;10 reps Gluteal Sets: AROM;Both;Supine;10 reps Other Exercises Other Exercises: Therapist reviewed ther ex for pt to be performing throughout the day to promote increased BLEs strength.    General Comments        Pertinent Vitals/Pain Pain Assessment: 0-10 Pain Score: 6  Faces Pain Scale: Hurts a little bit Pain Location: L foot Pain Descriptors / Indicators: Aching;Sore Pain Intervention(s): Limited activity within patient's tolerance    Home Living                      Prior Function            PT Goals (current goals can now be found in the care plan section) Acute Rehab PT Goals Patient Stated Goal: " I want to go home" Progress towards PT goals: Progressing toward goals    Frequency    Min 2X/week      PT Plan Current plan remains appropriate    Co-evaluation              AM-PAC PT "6 Clicks" Mobility   Outcome Measure  Help needed turning from your back to your side while in a flat bed without using bedrails?: A Lot Help needed moving from lying on your back to sitting on the side of a flat bed without using bedrails?: A Lot Help needed moving to and from a bed to a chair (including a wheelchair)?: A Lot Help needed standing up from a chair using your arms (e.g., wheelchair or bedside chair)?: A Lot Help needed to walk in hospital room?: Total Help needed climbing 3-5 steps with a railing? : Total 6 Click Score: 10    End of Session Equipment Utilized During Treatment: Gait belt;Other (comment)(Darco post op shoe) Activity Tolerance: Patient limited by fatigue;Patient limited by pain Patient left: in bed;with call  bell/phone within reach;with bed alarm set;with family/visitor present Nurse Communication: Mobility status PT Visit Diagnosis: Muscle weakness (generalized) (M62.81);Other abnormalities of gait and mobility (R26.89);Pain;Difficulty in walking, not elsewhere classified (R26.2) Pain - Right/Left: Left Pain - part of body: Ankle and joints of foot     Time: [id number] PT Time Calculation (min) (ACUTE ONLY): 27 min  Charges:  $Therapeutic Exercise: 8-22 mins $Therapeutic Activity: 8-22 mins                     Julaine Fusi PTA 06/08/19, 12:01 PM

## 2019-06-08 NOTE — Progress Notes (Signed)
ID Pt seen with Dr.Troxler Doing well Pain less No fever Swelling less  S/p amputation of 4th toe left and I/D of underlying  abscess 0/e   BP (!) 108/55 (BP Location: Left Arm)   Pulse (!) 58   Temp 98 F (36.7 C) (Oral)   Resp 16   Ht 5\' 6"  (1.676 m)   Wt 101.2 kg   LMP 05/25/1988   SpO2 90%   BMI 36.01 kg/m     The left foot is less swollen No erythema Surgical incision well co-apted except for an intentional area for draining which is packed . 06/08/19-Post I/D      Pre I/D   CBC Latest Ref Rng & Units 06/08/2019 06/04/2019 06/03/2019  WBC 4.0 - 10.5 K/uL 8.5 6.0 6.6  Hemoglobin 12.0 - 15.0 g/dL 12.3 12.6 12.6  Hematocrit 36.0 - 46.0 % 38.5 39.8 39.7  Platelets 150 - 400 K/uL 196 112(L) 120(L)    CMP Latest Ref Rng & Units 06/08/2019 06/08/2019 06/04/2019  Glucose 70 - 99 mg/dL 134(H) - 91  BUN 8 - 23 mg/dL 34(H) - 15  Creatinine 0.44 - 1.00 mg/dL 0.83 0.98 1.04(H)  Sodium 135 - 145 mmol/L 132(L) - 135  Potassium 3.5 - 5.1 mmol/L 5.1 - 4.6  Chloride 98 - 111 mmol/L 100 - 98  CO2 22 - 32 mmol/L 23 - 29  Calcium 8.9 - 10.3 mg/dL 9.6 - 9.6  Total Protein 6.5 - 8.1 g/dL - - -  Total Bilirubin 0.3 - 1.2 mg/dL - - -  Alkaline Phos 38 - 126 U/L - - -  AST 15 - 41 U/L - - -  ALT 0 - 44 U/L - - -    Impression/recommendation    Left foot gangrene/ s/p amputation of the 4th toe infection- due to serratia /pseudomonas Bony margin free of osteomyelitis  Currently on cefepime On discharge can be switched to ciprofloxacin 500mg  BID  for 7-10 days  Pharmacist to educate patient on side effects.  Discussed the management with patient and husband and Dr.Troxler and the [place] team!

## 2019-06-08 NOTE — TOC Progression Note (Signed)
Transition of Care [facility]) - Progression Note    Patient Details  Name: Nina Perez MRN: [id number] Date of Birth: 06/25/1940  Transition of Care [facility]) CM/SW [place], LCSW Phone Number: 06/08/2019, 12:44 PM  Clinical Narrative:  Met with patient and husband. Made them aware that [name] will see them for home health PT, OT, RN, aide, SW once discharged. 3-in-1 will be delivered to the home on Friday. PT had messaged CSW about a hospital bed but husband is not sure if they are ready for that yet. Encouraged them to notify CSW if they change their mind before discharge.   Expected Discharge Plan: [place] Barriers to Discharge: Continued Medical Work up  Expected Discharge Plan and Services Expected Discharge Plan: [place] Choice: Durable Medical Equipment, Home Health Living arrangements for the past 2 months: Single Family Home Expected Discharge Date: 06/06/19                                     Social Determinants of Health (SDOH) Interventions    Readmission Risk Interventions No flowsheet data found.

## 2019-06-08 NOTE — Progress Notes (Signed)
[facility] Podiatry                                                      Patient Demographics  Nina Perez, is a 79 y.o. female   MRN: [id number]   DOB - June 17, 1940  Admit Date - 06/01/2019    Outpatient Primary MD for the patient is Einar Pheasant, MD  Consult requested in the Hospital by Wyvonnia Dusky, MD, On 06/08/2019   With History of -  Past Medical History:  Diagnosis Date  . Barrett's esophagus   . Cancer ([place])    skin ca  . Diabetes mellitus ([place])   . Gout   . History of colon cancer    adenomatous polyps  . Hypercholesterolemia   . Hypertension   . Inflammatory arthritis   . Osteoarthritis    knees, spine  . Phlebitis    x2 (with pregnancy)  . 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 Pulmonary hypertension ([place])   . Reflux esophagitis   . Rheumatoid arthritis(714.0)    positive RF, FANA, RNP, negative CCP ab and anti DNA,.  neg anti-SCL 70  . 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 ABDOMINAL AORTOGRAM W/LOWER EXTREMITY Left 05/16/2019   Procedure: ABDOMINAL AORTOGRAM W/LOWER EXTREMITY;  Surgeon: Lorretta Harp, MD;  Location: [place] [facility];  Service: Cardiovascular;  Laterality: Left;  . AMPUTATION TOE Left 06/02/2019   Procedure: AMPUTATION TOE;  Surgeon: Albertine Patricia, DPM;  Location: [facility] ORS;  Service: Podiatry;  Laterality: Left;  . BREAST CYST ASPIRATION Left    neg  . INCISION AND DRAINAGE OF WOUND Left 06/07/2019   Procedure: IRRIGATION AND DEBRIDEMENT WOUND;  Surgeon: Albertine Patricia, DPM;  Location: [facility] ORS;  Service: Podiatry;  Laterality: Left;  . LOWER EXTREMITY ANGIOGRAPHY Left 06/03/2019   Procedure: Lower Extremity Angiography;  Surgeon: Katha Cabal, MD;  Location: [place] [facility];  Service: Cardiovascular;   Laterality: Left;  . PERIPHERAL VASCULAR BALLOON ANGIOPLASTY Left 05/16/2019   Procedure: PERIPHERAL VASCULAR BALLOON ANGIOPLASTY;  Surgeon: Lorretta Harp, MD;  Location: [place] [facility];  Service: Cardiovascular;  Laterality: Left;  SFA  . VEIN LIGATION AND STRIPPING      Nina Perez  is a 79 y.o. female, 1 day status post I&D of postop wound infection.    Review of Systems    In addition to the HPI above,  No Fever-chills, No Headache, No changes with Vision or hearing, No problems swallowing food or Liquids, No Chest pain, Cough or Shortness of Breath, No Abdominal pain, No Nausea or Vommitting, Bowel movements are regular, No Blood in stool or Urine, No dysuria, No new skin rashes or bruises, No new joints pains-aches,  No new weakness, tingling, numbness in any extremity, No recent weight gain or loss, No polyuria, polydypsia or polyphagia, No significant Mental Stressors.  A full 10 point Review of Systems was done, except as stated above, all other Review of Systems were negative.   Social History Social History   Tobacco Use  . Smoking status: Former Smoker    Packs/day: 0.50    Years: 15.00    Pack years: 7.50    Quit date: 05/12/1988    Years since quitting:  31.0  . Smokeless tobacco: Never Used  Substance Use Topics  . Alcohol use: No    Alcohol/week: 0.0 standard drinks    Family History Family History  Problem Relation Age of Onset  . Arthritis Mother   . Heart attack Father   . Colon cancer Neg Hx   . Breast cancer Neg Hx     Prior to Admission medications   Medication Sig Start Date End Date Taking? Authorizing Provider  acetaminophen (TYLENOL) 650 MG CR tablet Take 650 mg by mouth every 4 (four) hours as needed for pain.    Yes [provider]  atorvastatin (LIPITOR) 80 MG tablet Take 1 tablet (80 mg total) by mouth daily at 6 PM. 05/17/19  Yes Cheryln Manly, NP  clopidogrel (PLAVIX) 75 MG tablet Take 1 tablet (75 mg total) by  mouth daily with breakfast. 05/17/19  Yes Cheryln Manly, NP  folic acid (FOLVITE) 1 MG tablet Take 1 mg by mouth every evening.    Yes [provider]  allopurinol (ZYLOPRIM) 300 MG tablet Take 1 tablet by mouth once daily Patient taking differently: Take 300 mg by mouth daily.  03/28/19   Einar Pheasant, MD  amLODipine (NORVASC) 10 MG tablet TAKE 1 TABLET BY MOUTH ONCE DAILY Patient taking differently: Take 10 mg by mouth daily.  08/03/18   Einar Pheasant, MD  aspirin 81 MG tablet Take 81 mg by mouth daily. Reported on 07/24/2015    [provider]  ciprofloxacin (CIPRO) 500 MG tablet Take 1 tablet (500 mg total) by mouth 2 (two) times daily. 06/06/19   Nolberto Hanlon, MD  fluticasone (FLONASE) 50 MCG/ACT nasal spray Place 2 sprays into the nose daily. Patient not taking: Reported on 06/01/2019 05/25/12   Einar Pheasant, MD  gabapentin (NEURONTIN) 100 MG capsule Take 1 capsule (100 mg total) by mouth 3 (three) times daily. 05/23/19   Einar Pheasant, MD  glucose blood (BAYER CONTOUR TEST) test strip USE  STRIP TO CHECK GLUCOSE TWICE DAILY. Dx: E11.9 05/29/17   Einar Pheasant, MD  losartan (COZAAR) 100 MG tablet Take 1 tablet by mouth once daily Patient taking differently: Take 100 mg by mouth daily.  03/10/19   Einar Pheasant, MD  metFORMIN (GLUCOPHAGE) 500 MG tablet Take 1 tablet by mouth once daily Patient taking differently: Take 500 mg by mouth daily with breakfast. Give w/food. 02/21/19   Einar Pheasant, MD  methotrexate 2.5 MG tablet Take 10 mg by mouth every Wednesday.     [provider]  metoprolol succinate (TOPROL-XL) 50 MG 24 hr tablet TAKE 1 TABLET BY MOUTH ONCE DAILY IMMEDIATELY  FOLLOWING  A  MEAL 05/19/19   Einar Pheasant, MD  MICROLET LANCETS MISC Check sugar once daily, [employer] Microlet Lancets. Dx E11.9 10/02/14   Einar Pheasant, MD  mupirocin ointment (BACTROBAN) 2 % Place 1 application into the nose 2 (two) times daily. 06/06/19   Nolberto Hanlon, MD   pantoprazole (PROTONIX) 40 MG tablet Take 1 tablet (40 mg total) by mouth daily. 05/17/19   Cheryln Manly, NP  polyethylene glycol (MIRALAX / GLYCOLAX) 17 g packet Take 17 g by mouth daily. 06/07/19   Nolberto Hanlon, MD  senna-docusate (SENOKOT-S) 8.6-50 MG tablet Take 1 tablet by mouth 2 (two) times daily. 06/06/19   Nolberto Hanlon, MD  spironolactone (ALDACTONE) 25 MG tablet Take 1 tablet by mouth once daily Patient taking differently: Take 25 mg by mouth daily.  12/16/18   Einar Pheasant, MD  traMADol Veatrice Bourbon)  50 MG tablet Take 1 tablet (50 mg total) by mouth 2 (two) times daily as needed (pain.). 05/23/19   Einar Pheasant, MD    Anti-infectives (From admission, onward)   Start     Dose/Rate Route Frequency Ordered Stop   06/08/19 2200  ceFEPIme (MAXIPIME) 2 g in sodium chloride 0.9 % 100 mL IVPB     2 g 200 mL/hr over 30 Minutes Intravenous Every 8 hours 06/08/19 1050     06/06/19 2200  ceFEPIme (MAXIPIME) 2 g in sodium chloride 0.9 % 100 mL IVPB  Status:  Discontinued     2 g 200 mL/hr over 30 Minutes Intravenous Every 12 hours 06/06/19 1600 06/08/19 1050   06/06/19 1130  ciprofloxacin (CIPRO) tablet 500 mg  Status:  Discontinued     500 mg Oral 2 times daily 06/06/19 1125 06/06/19 1600   06/06/19 0000  ciprofloxacin (CIPRO) 500 MG tablet     500 mg Oral 2 times daily 06/06/19 1158     06/03/19 0000  ceFAZolin (ANCEF) IVPB 2g/100 mL premix    Note to Pharmacy: To be given in specials   2 g 200 mL/hr over 30 Minutes Intravenous  Once 06/02/19 2154 06/03/19 1154   06/03/19 0000  clindamycin (CLEOCIN) IVPB 300 mg  Status:  Discontinued    Note to Pharmacy: To be given in specials   300 mg 100 mL/hr over 30 Minutes Intravenous  Once 06/02/19 2154 06/03/19 1035   06/02/19 1400  ceFAZolin (ANCEF) IVPB 1 g/50 mL premix  Status:  Discontinued     1 g 100 mL/hr over 30 Minutes Intravenous Every 8 hours 06/02/19 1223 06/06/19 1125      Scheduled Meds: . allopurinol  300 mg Oral Daily  .  amLODipine  10 mg Oral Daily  . aspirin EC  81 mg Oral Daily  . atorvastatin  80 mg Oral q1800  . clopidogrel  75 mg Oral Q breakfast  . docusate sodium  100 mg Oral BID  . enoxaparin (LOVENOX) injection  40 mg Subcutaneous Q24H  . folic acid  1 mg Oral QPM  . gabapentin  100 mg Oral TID  . insulin aspart  0-5 Units Subcutaneous QHS  . insulin aspart  0-9 Units Subcutaneous TID WC  . losartan  100 mg Oral Daily  . methotrexate  10 mg Oral Q Wed  . metoprolol succinate  50 mg Oral Daily  . pantoprazole  40 mg Oral Daily  . polyethylene glycol  17 g Oral Daily  . senna-docusate  1 tablet Oral BID  . sodium chloride flush  3 mL Intravenous Q12H  . spironolactone  25 mg Oral Daily   Continuous Infusions: . sodium chloride Stopped (06/05/19 1553)  . sodium chloride 50 mL/hr at 06/07/19 1313  . ceFEPime (MAXIPIME) IV     PRN Meds:.sodium chloride, acetaminophen, hydrALAZINE, labetalol, morphine injection, ondansetron (ZOFRAN) IV, oxyCODONE, sodium chloride flush, sodium phosphate, traMADol  Allergies  Allergen Reactions  . Maxzide [Triamterene-Hctz] Other (See Comments)    Weakness/fatigue     Physical Exam  Vitals  Blood pressure 128/65, pulse 77, temperature (!) 97.5 F (36.4 C), temperature source Oral, resp. rate 14, height 5\' 6"  (1.676 m), weight 101.2 kg, last menstrual period 05/25/1988, SpO2 94 %.  Lower Extremity exam: Dressing change today the redness cellulitis is significantly improved.  Foot looks much better.  Packing was removed from the wounds today.  Patient has been afebrile.  She is able to get up some  today with physical therapy also.  Data Review  CBC Recent Labs  Lab 06/01/19 1546 06/02/19 0432 06/03/19 0416 06/04/19 0735 06/08/19 0855  WBC 5.0 5.2 6.6 6.0 8.5  HGB 12.1 11.7* 12.6 12.6 12.3  HCT 38.9 37.0 39.7 39.8 38.5  PLT 153 132* 120* 112* 196  MCV 99.0 97.6 98.3 97.8 97.2  MCH 30.8 30.9 31.2 31.0 31.1  MCHC 31.1 31.6 31.7 31.7 31.9  RDW  17.7* 17.8* 17.6* 18.0* 17.0*   ------------------------------------------------------------------------------------------------------------------  Chemistries  Recent Labs  Lab 06/02/19 0432 06/03/19 0416 06/04/19 0735 06/08/19 0636 06/08/19 0855  NA 136 137 135  --  132*  K 4.3 4.9 4.6  --  5.1  CL 103 101 98  --  100  CO2 26 28 29   --  23  GLUCOSE 82 82 91  --  134*  BUN 22 19 15   --  34*  CREATININE 1.00 1.10* 1.04* 0.98 0.83  CALCIUM 9.9 9.8 9.6  --  9.6  MG  --  1.4*  --   --   --   AST 18  --   --   --   --   ALT 12  --   --   --   --   ALKPHOS 55  --   --   --   --   BILITOT 0.9  --   --   --   --    ------------------------------------------------------------------------------------------------------------------ estimated creatinine clearance is 67.1 mL/min (by C-G formula based on SCr of 0.83 mg/dL). ------------------------------------------------------------------------------------------------------------------ No results for input(s): TSH, T4TOTAL, T3FREE, THYROIDAB in the last 72 hours.  Invalid input(s): FREET3 Urinalysis    Component Value Date/Time   COLORURINE YELLOW 11/24/2018 1004   APPEARANCEUR CLEAR 11/24/2018 1004   LABSPEC 1.025 11/24/2018 1004   PHURINE 5.5 11/24/2018 1004   GLUCOSEU NEGATIVE 11/24/2018 1004   HGBUR NEGATIVE 11/24/2018 1004   BILIRUBINUR SMALL (A) 11/24/2018 1004   KETONESUR NEGATIVE 11/24/2018 1004   UROBILINOGEN 1.0 11/24/2018 1004   NITRITE NEGATIVE 11/24/2018 1004   LEUKOCYTESUR TRACE (A) 11/24/2018 1004    Assessment & Plan: Overall think foot looks much better the swelling redness is down like infections under good control at this point.  The surgery yesterday did not illustrate much the way of deep infection but more of a superficial wound.  I was able remove blood clots and hematoma that had formed in the region.  I removed the packing today but repacked it with iodoform gauze at the dorsal wound.  Compression wrap was  placed across region as well.  I saw her in conjunction with Dr. Tama High who took pictures. For the patient to go home this can be dependent on hospitalist as well as physical therapy in determining her capacity to deal to do that. 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  Albertine Patricia M.D on 06/08/2019 at 12:26 PM  Thank you for the consult, we will follow the patient with you in the Hospital.

## 2019-06-08 NOTE — Progress Notes (Signed)
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 Culture with Pseudomonas and Serratia. Continue on IV cefepime as per ID. ID recs apprec   Fever: likely secondary to infection above. Afebrile for 24 hours. Blood cx NGTD. Continue on IV cefepime  PAD: s/p lower extremity angiography & transluminal angioplasty left anterior tibial. Successful revascularization extremities. Continue on aspirin, plavix and statin. Vascular signed off, recommended follow-up as outpatient  DM2: continue on SSI w/ accuchecks   HLD: continue on statin  Rheumatoid arthritis: no signs of flare. Continue on methotrexate   HTN: continue on losartan    CKDIIIa: currently stage I or II as GFR >60. Cr continues to trend down   Thrombocytopenia: etiology unclear. Will continue to monitor    DVT prophylaxis: lovenox Code Status: full  Family Communication:  Disposition Plan:    Consultants:   Podiatry  ID   Procedures:    Antimicrobials: cefepime   Subjective: Pt c/o fatigue  Objective: Vitals:   06/07/19 1928 06/07/19 2041 06/08/19 0023 06/08/19 0404  BP: (!) 121/54 139/60 (!) 110/51 (!) 143/69  Pulse: 69 66 70 67  Resp: 14 14 14 14   Temp: (!) 97.5 F (36.4 C) 98.3 F (36.8 C) 98.1 F (36.7 C) (!) 97.4 F (36.3 C)  TempSrc:   Oral Oral  SpO2: 93% 92% 95% 92%  Weight:      Height:        Intake/Output Summary (Last 24 hours) at 06/08/2019 0825 Last data filed at 06/07/2019 1830 Gross per 24 hour    Intake 420 ml  Output 3 ml  Net 417 ml   Filed Weights   06/01/19 1425 06/02/19 1107 06/04/19 0500  Weight: 102 kg 102 kg 101.2 kg    Examination:  General exam: Appears calm and comfortable  Respiratory system: Clear to auscultation. No rales Cardiovascular system: S1 & S2 +. No , rubs, gallops or clicks.  Gastrointestinal system: Abdomen is nondistended, soft and nontender. Normal bowel sounds heard. Central nervous system: Alert and oriented. Moves all 4 extremities  Psychiatry: Judgement and insight appear normal. Flat mood and affect     Data Reviewed: I have personally reviewed following labs and imaging studies  CBC: Recent Labs  Lab 06/01/19 1546 06/02/19 0432 06/03/19 0416 06/04/19 0735  WBC 5.0 5.2 6.6 6.0  HGB 12.1 11.7* 12.6 12.6  HCT 38.9 37.0 39.7 39.8  MCV 99.0 97.6 98.3 97.8  PLT 153 132* 120* [id number]*   Basic Metabolic Panel: Recent Labs  Lab 06/01/19 1546 06/02/19 0432 06/03/19 0416 06/04/19 0735 06/08/19 0636  NA  --  136 137 135  --   K  --  4.3 4.9 4.6  --   CL  --  103 101 98  --   CO2  --  26 28 29   --   GLUCOSE  --  82 82 91  --   BUN  --  22 19 15   --   CREATININE 1.24* 1.00 1.10*  1.04* 0.98  CALCIUM  --  9.9 9.8 9.6  --   MG  --   --  1.4*  --   --    GFR: Estimated Creatinine Clearance: 56.8 mL/min (by C-G formula based on SCr of 0.98 mg/dL). Liver Function Tests: Recent Labs  Lab 06/02/19 0432  AST 18  ALT 12  ALKPHOS 55  BILITOT 0.9  PROT 6.5  ALBUMIN 3.1*   No results for input(s): LIPASE, AMYLASE in the last 168 hours. No results for input(s): AMMONIA in the last 168 hours. Coagulation Profile: Recent Labs  Lab 06/02/19 0432  INR 1.1   Cardiac Enzymes: No results for input(s): CKTOTAL, CKMB, CKMBINDEX, TROPONINI in the last 168 hours. BNP (last 3 results) No results for input(s): PROBNP in the last 8760 hours. HbA1C: No results for input(s): HGBA1C in the last 72 hours. CBG: Recent Labs  Lab 06/07/19 1638  06/07/19 2109 06/08/19 0025 06/08/19 0403 06/08/19 0743  GLUCAP 126* 132* 164* 130* 110*   Lipid Profile: No results for input(s): CHOL, HDL, LDLCALC, TRIG, CHOLHDL, LDLDIRECT in the last 72 hours. Thyroid Function Tests: No results for input(s): TSH, T4TOTAL, FREET4, T3FREE, THYROIDAB in the last 72 hours. Anemia Panel: No results for input(s): VITAMINB12, FOLATE, FERRITIN, TIBC, IRON, RETICCTPCT in the last 72 hours. Sepsis Labs: No results for input(s): PROCALCITON, LATICACIDVEN in the last 168 hours.  Recent Results (from the past 240 hour(s))  Surgical PCR screen     Status: None   Collection Time: 06/01/19  2:38 PM   Specimen: Nasal Mucosa; Nasal Swab  Result Value Ref Range Status   MRSA, PCR NEGATIVE NEGATIVE Final   Staphylococcus aureus NEGATIVE NEGATIVE Final    Comment: (NOTE) The Xpert SA Assay (FDA approved for NASAL specimens in patients 79 years of age and older), is one component of a comprehensive surveillance program. It is not intended to diagnose infection nor to guide or monitor treatment. Performed at [facility], [address] [place], [place] [address]   Respiratory Panel by RT PCR (Flu A&B, Covid) - Nasopharyngeal Swab     Status: None   Collection Time: 06/01/19  5:15 PM   Specimen: Nasopharyngeal Swab  Result Value Ref Range Status   SARS Coronavirus 2 by RT PCR NEGATIVE NEGATIVE Final    Comment: (NOTE) SARS-CoV-2 target nucleic acids are NOT DETECTED. The SARS-CoV-2 RNA is generally detectable in upper respiratoy specimens during the acute phase of infection. The lowest concentration of SARS-CoV-2 viral copies this assay can detect is 131 copies/mL. A negative result does not preclude SARS-Cov-2 infection and should not be used as the sole basis for treatment or other patient management decisions. A negative result may occur with  improper specimen collection/handling, submission of specimen other than nasopharyngeal swab,  presence of viral mutation(s) within the areas targeted by this assay, and inadequate number of viral copies (<131 copies/mL). A negative result must be combined with clinical observations, patient history, and epidemiological information. The expected result is Negative. Fact Sheet for Patients:  PinkCheek.be Fact Sheet for Healthcare Providers:  GravelBags.it This test is not yet ap proved or cleared by the [place] FDA and  has been authorized for detection and/or diagnosis of SARS-CoV-2 by FDA under an Emergency Use Authorization (EUA). This EUA will remain  in effect (meaning this test can be used) for the duration of the COVID-19 declaration under Section 564(b)(1) of the Act, 21 U.S.C. section 360bbb-3(b)(1), unless the authorization is terminated or revoked sooner.  Influenza A by PCR NEGATIVE NEGATIVE Final   Influenza B by PCR NEGATIVE NEGATIVE Final    Comment: (NOTE) The Xpert Xpress SARS-CoV-2/FLU/RSV assay is intended as an aid in  the diagnosis of influenza from Nasopharyngeal swab specimens and  should not be used as a sole basis for treatment. Nasal washings and  aspirates are unacceptable for Xpert Xpress SARS-CoV-2/FLU/RSV  testing. Fact Sheet for Patients: PinkCheek.be Fact Sheet for Healthcare Providers: GravelBags.it This test is not yet approved or cleared by the [place] FDA and  has been authorized for detection and/or diagnosis of SARS-CoV-2 by  FDA under an Emergency Use Authorization (EUA). This EUA will remain  in effect (meaning this test can be used) for the duration of the  Covid-19 declaration under Section 564(b)(1) of the Act, 21  U.S.C. section 360bbb-3(b)(1), unless the authorization is  terminated or revoked. Performed at [facility], [address] [place], [place] [address]   Aerobic Culture  (superficial specimen)     Status: None (Preliminary result)   Collection Time: 06/04/19 10:19 AM   Specimen: Foot; Wound  Result Value Ref Range Status   Specimen Description   Final    FOOT Performed at [facility], [address], [place], [place] [address]    Special Requests   Final    Normal Performed at [facility], [address] [place], [place] [address]    Gram Stain   Final    MODERATE WBC PRESENT, PREDOMINANTLY PMN MODERATE GRAM NEGATIVE RODS FEW GRAM POSITIVE COCCI    Culture   Final    ABUNDANT PSEUDOMONAS AERUGINOSA ABUNDANT SERRATIA MARCESCENS CULTURE REINCUBATED FOR BETTER GROWTH Performed at [facility], [place] [address] [place], [place] [address]    Report Status PENDING  Incomplete   Organism ID, Bacteria PSEUDOMONAS AERUGINOSA  Final   Organism ID, Bacteria SERRATIA MARCESCENS  Final      Susceptibility   Pseudomonas aeruginosa - MIC*    CEFTAZIDIME 4 SENSITIVE Sensitive     CIPROFLOXACIN <=0.25 SENSITIVE Sensitive     GENTAMICIN 2 SENSITIVE Sensitive     IMIPENEM 2 SENSITIVE Sensitive     PIP/TAZO 8 SENSITIVE Sensitive     CEFEPIME 2 SENSITIVE Sensitive     * ABUNDANT PSEUDOMONAS AERUGINOSA   Serratia marcescens - MIC*    CEFAZOLIN >=64 RESISTANT Resistant     CEFEPIME <=0.12 SENSITIVE Sensitive     CEFTAZIDIME <=1 SENSITIVE Sensitive     CEFTRIAXONE <=0.25 SENSITIVE Sensitive     CIPROFLOXACIN <=0.25 SENSITIVE Sensitive     GENTAMICIN <=1 SENSITIVE Sensitive     TRIMETH/SULFA <=20 SENSITIVE Sensitive     * ABUNDANT SERRATIA MARCESCENS  CULTURE, BLOOD (ROUTINE X 2) w Reflex to ID Panel     Status: None (Preliminary result)   Collection Time: 06/05/19  5:39 PM   Specimen: BLOOD  Result Value Ref Range Status   Specimen Description BLOOD LAC  Final   Special Requests   Final    BOTTLES DRAWN AEROBIC AND ANAEROBIC Blood Culture results may not be optimal due to an excessive volume of blood received in culture bottles     Culture   Final    NO GROWTH 3 DAYS Performed at [facility], [address] [place], [place] [address]    Report Status PENDING  Incomplete  CULTURE, BLOOD (ROUTINE X 2) w Reflex to ID Panel     Status: None (Preliminary result)   Collection Time: 06/05/19  5:46 PM   Specimen: BLOOD  Result Value Ref Range Status   Specimen Description BLOOD RAC  Final   Special Requests   Final    BOTTLES DRAWN AEROBIC AND ANAEROBIC Blood Culture adequate volume   Culture   Final    NO GROWTH 3 DAYS Performed at [facility], [address], [place], [place] [address]    Report Status PENDING  Incomplete  Aerobic/Anaerobic Culture (surgical/deep wound)     Status: None (Preliminary result)   Collection Time: 06/07/19  2:06 PM   Specimen: PATH Other; Body Fluid  Result Value Ref Range Status   Specimen Description   Final    FOOT LEFT FOOT Performed at [facility], [address], [place], [place] [address]    Special Requests   Final    NONE Performed at [facility], [address] [place], [place] [address]    Gram Stain   Final    NO WBC SEEN NO ORGANISMS SEEN Performed at [facility], [place] [address] [place], [place] [address]    Culture PENDING  Incomplete   Report Status PENDING  Incomplete         Radiology Studies: No results found.      Scheduled Meds: . allopurinol  300 mg Oral Daily  . amLODipine  10 mg Oral Daily  . aspirin EC  81 mg Oral Daily  . atorvastatin  80 mg Oral q1800  . clopidogrel  75 mg Oral Q breakfast  . docusate sodium  100 mg Oral BID  . enoxaparin (LOVENOX) injection  40 mg Subcutaneous Q24H  . folic acid  1 mg Oral QPM  . gabapentin  100 mg Oral TID  . insulin aspart  0-5 Units Subcutaneous QHS  . insulin aspart  0-9 Units Subcutaneous TID WC  . losartan  100 mg Oral Daily  . methotrexate  10 mg Oral Q Wed  . metoprolol succinate  50 mg Oral Daily  . pantoprazole  40 mg Oral Daily  .  polyethylene glycol  17 g Oral Daily  . senna-docusate  1 tablet Oral BID  . sodium chloride flush  3 mL Intravenous Q12H  . spironolactone  25 mg Oral Daily   Continuous Infusions: . sodium chloride Stopped (06/05/19 1553)  . sodium chloride 50 mL/hr at 06/07/19 1313  . ceFEPime (MAXIPIME) IV 2 g (06/07/19 2135)     LOS: 7 days    Time spent: 30 mins    Wyvonnia Dusky, MD [facility] Pager [phone]  If 7PM-7AM, please contact night-coverage [email or web address] Password [email or web address] 06/08/2019, 8:25 AM

## 2019-06-08 NOTE — Progress Notes (Signed)
PHARMACY NOTE:  ANTIMICROBIAL RENAL DOSAGE ADJUSTMENT  Current antimicrobial regimen includes a mismatch between antimicrobial dosage and estimated renal function.  As per policy approved by the Pharmacy & Therapeutics and Medical Executive Committees, the antimicrobial dosage will be adjusted accordingly.  Current antimicrobial dosage:   Cefepime 2g IV every 12 hours  Indication: Wound infection/Gangrene of 4th MTP  Renal Function:  Estimated Creatinine Clearance: 67.1 mL/min (by C-G formula based on SCr of 0.83 mg/dL).  Antimicrobial dosage has been changed to:  Cefepime 2g IV every 8 hours for CrCl >62ml/min.    Thank you for allowing pharmacy to be a part of this patient's care.  Pernell Dupre, PharmD, BCPS Clinical Pharmacist 06/08/2019 10:51 AM

## 2019-06-09 LAB — CBC
HCT: 34.8 % — ABNORMAL LOW (ref 36.0–46.0)
Hemoglobin: 10.9 g/dL — ABNORMAL LOW (ref 12.0–15.0)
MCH: 30.9 pg (ref 26.0–34.0)
MCHC: 31.3 g/dL (ref 30.0–36.0)
MCV: 98.6 fL (ref 80.0–100.0)
Platelets: 206 10*3/uL (ref 150–400)
RBC: 3.53 MIL/uL — ABNORMAL LOW (ref 3.87–5.11)
RDW: 17.2 % — ABNORMAL HIGH (ref 11.5–15.5)
WBC: 8.1 10*3/uL (ref 4.0–10.5)
nRBC: 0 % (ref 0.0–0.2)

## 2019-06-09 LAB — GLUCOSE, CAPILLARY
Glucose-Capillary: 85 mg/dL (ref 70–99)
Glucose-Capillary: 110 mg/dL — ABNORMAL HIGH (ref 70–99)
Glucose-Capillary: 105 mg/dL — ABNORMAL HIGH (ref 70–99)

## 2019-06-09 LAB — AEROBIC CULTURE W GRAM STAIN (SUPERFICIAL SPECIMEN): Special Requests: NORMAL

## 2019-06-09 LAB — BASIC METABOLIC PANEL
Anion gap: 5 (ref 5–15)
BUN: 33 mg/dL — ABNORMAL HIGH (ref 8–23)
CO2: 29 mmol/L (ref 22–32)
Calcium: 9.7 mg/dL (ref 8.9–10.3)
Chloride: 100 mmol/L (ref 98–111)
Creatinine, Ser: 0.92 mg/dL (ref 0.44–1.00)
GFR calc Af Amer: >60 mL/min (ref >60)
GFR calc non Af Amer: 60 mL/min — ABNORMAL LOW (ref >60)
Glucose, Bld: 88 mg/dL (ref 70–99)
Potassium: 5.2 mmol/L — ABNORMAL HIGH (ref 3.5–5.1)
Sodium: 134 mmol/L — ABNORMAL LOW (ref 135–145)

## 2019-06-09 LAB — POTASSIUM: Potassium: 4.9 mmol/L (ref 3.5–5.1)

## 2019-06-09 MED ORDER — HYDRALAZINE HCL 50 MG PO TABS: 50.0000 mg | ORAL_TABLET | Freq: Four times a day (QID) | ORAL | Status: DC | PRN

## 2019-06-09 NOTE — Care Management Important Message (Signed)
Important Message  Patient Details  Name: Nina Perez MRN: [id number] Date of Birth: 1940-05-29   Medicare Important Message Given:  Yes     Juliann Pulse A Tige Meas 06/09/2019, 11:03 AM

## 2019-06-09 NOTE — TOC Transition Note (Addendum)
Transition of Care [facility]) - CM/SW Discharge Note   Patient Details  Name: Nina Perez MRN: [id number] Date of Birth: Jul 02, 1940  [facility] CM/SW Contact:  Candie Chroman, LCSW Phone Number: 06/09/2019, 2:00 PM   Clinical Narrative: Patient has orders to discharge home today. [facility] is aware. EMS paperwork is on the chart. No further concerns. CSW signing off.    3:23 pm: Per MD, husband with concerns about discharge due to 3-in-1 not being delivered until tomorrow. [place] representative and he delivered one to the room. MD and RN aware. No further concerns. CSW signing off.  Final next level of care: [place] Barriers to Discharge: Barriers Resolved   Patient Goals and CMS Choice Patient states their goals for this hospitalization and ongoing recovery are:: To go home   Choice offered to / list presented to : Patient, Spouse  Discharge Placement                Patient to be transferred to facility by: EMS   Patient and family notified of of transfer: 06/09/19  Discharge Plan and Services     Post Acute Care Choice: Durable Medical Equipment, Home Health                    HH Arranged: RN, PT, OT, Nurse's Aide, Social Work [employer] Agency: Deputy Date [facility] Agency Contacted: 06/09/19   Representative spoke with at [place]: [place] (SDOH) Interventions     Readmission Risk Interventions No flowsheet data found.

## 2019-06-09 NOTE — Discharge Summary (Signed)
Physician Discharge Summary  MONISHA SIEBEL VFI:[id number] DOB: [birth year] DOA: 06/01/2019  PCP: Einar Pheasant, MD  Admit date: 06/01/2019 Discharge date: 06/09/2019  Admitted From: home Disposition:  Home w/ home health   Recommendations for Outpatient Follow-up:  1. Follow up with PCP in 1-2 weeks 2. F/u podiatry,Dr. Elvina Mattes, on 07/07/19 at 10:15 3. F/u Vascular surg, Dr. Wynona Meals, on 07/06/19 at [place]: yes Equipment/Devices: 3in1, walker  Discharge Condition: stable CODE STATUS: full  Diet recommendation: Heart Healthy / Carb Modified   Brief/Interim Summary: HPI was taken from Dr. Girard Cooter: LAQUIESHA PIACENTE is a 79 y.o. female with medical history significant of DM II, HTN, Pad presents with her left fourth toe turning black 4 weeks ago. PT has been going to the wound center in [place] and after several weeks is here to have toe removed as it cannot be saved per Mr [name] -husband. Pt has balloon angioplasty /angiogram last week in [place] and was not told results. Per chart review:No evidence of significant stenosis of the left lower extremity, s/p left SFA mid angioplasty. Marked improvement is noted compared to previous exam. Yesterday pt went to podiatry appt and was told to come to hospital. Pt is only c/o left toe pain. Pt uses oxygen 2L [place] at home, pt uses it only intermittently only if she is sob and next appt is in few weeks.  Hospital Course from Dr. Lenise Herald 1/27-1/28/21: Pt was found to have gangrene of left 4th MTP and is s/p amputation. Would cxs grew pseudomonas and serratia. Pt did receive IV cefepime while inpatient and was transitioned to po cipro 599m BID x 7-10 days as per ID. Bony margin free of osteomyelitis as per ID. Pt was d/c home w/ home health which including wound care/dressings changes. PT recommended SNF but pt refused and agreed to home health. Of note, pt has PAD and is s/p lower extremity angiography & transluminal angioplasty left  anterior tibial. Successful revascularization extremities. Continue on aspirin, plavix and statin. Vascular signed off, recommended follow-up as outpatient on 07/06/19. For more information, please see previous progress notes prior to 06/08/19.    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 Culture with Pseudomonas and Serratia. Continue on IV cefepime & on discharge can be switched to ciprofloxacin 5031mBID  for 7-10 days as per ID. ID recs apprec   Fever: likely secondary to infection above. Afebrile for 48 hours. Blood cx NGTD. Continue on IV cefepime  PAD: s/p lower extremity angiography & transluminal angioplasty left anterior tibial. Successful revascularization extremities. Continue on aspirin, plavix and statin. Vascular signed off, recommended follow-up as outpatient  DM2: continue on SSI w/ accuchecks while inpatient   HLD: continue on statin  Rheumatoid arthritis: no signs of flare. Continue on methotrexate   HTN: continue on losartan    CKDIIIa: currently stage I or II as GFR >60. Cr is better than baseline   Thrombocytopenia: etiology unclear. Will continue to monitor   Generalized weakness: PT recommends SNF but pt refused and agreed to home health   Discharge Instructions  Discharge Instructions  Call MD for:  temperature >100.4   Complete by: As directed    Diet - low sodium heart healthy   Complete by: As directed    Diet - low sodium heart healthy   Complete by: As directed    Discharge instructions   Complete by: As directed    Ambulate with help F/u with podiatry F/u with  vascular surgery F/u with primary care   Discharge instructions   Complete by: As directed    F/u PCP in 1-2 weeks; F/u podiatry (Dr. Elvina Mattes) w/in 1 week   Increase activity slowly   Complete by: As directed    Increase activity slowly   Complete by: As directed      Allergies as of 06/09/2019      Reactions   Maxzide [triamterene-hctz] Other (See Comments)   Weakness/fatigue      Medication List    TAKE these medications   acetaminophen 650 MG CR tablet Commonly known as: TYLENOL Take 650 mg by mouth every 4 (four) hours as needed for pain.   allopurinol 300 MG tablet Commonly known as: ZYLOPRIM Take 1 tablet by mouth once daily   amLODipine 10 MG tablet Commonly known as: NORVASC TAKE 1 TABLET BY MOUTH ONCE DAILY   aspirin 81 MG tablet Take 81 mg by mouth daily. Reported on 07/24/2015   atorvastatin 80 MG tablet Commonly known as: LIPITOR Take 1 tablet (80 mg total) by mouth daily at 6 PM.   ciprofloxacin 500 MG tablet Commonly known as: CIPRO Take 1 tablet (500 mg total) by mouth 2 (two) times daily.   clopidogrel 75 MG tablet Commonly known as: PLAVIX Take 1 tablet (75 mg total) by mouth daily with breakfast.   fluticasone 50 MCG/ACT nasal spray Commonly known as: FLONASE Place 2 sprays into the nose daily.   folic acid 1 MG tablet Commonly known as: FOLVITE Take 1 mg by mouth every evening.   gabapentin 100 MG capsule Commonly known as: NEURONTIN Take 1 capsule (100 mg total) by mouth 3 (three) times daily.   glucose blood test strip Commonly known as: Estate manager/land agent USE  STRIP TO CHECK GLUCOSE TWICE DAILY. Dx: E11.9   losartan 100 MG tablet Commonly known as: COZAAR Take 1 tablet by mouth once daily   metFORMIN 500 MG tablet Commonly known as: GLUCOPHAGE Take 1 tablet by mouth once daily What changed:   when to take this  additional instructions   methotrexate 2.5 MG tablet Take 10 mg by mouth every Wednesday.   metoprolol  succinate 50 MG 24 hr tablet Commonly known as: TOPROL-XL TAKE 1 TABLET BY MOUTH ONCE DAILY IMMEDIATELY  FOLLOWING  A  MEAL What changed: See the new instructions.   Microlet Lancets Misc Check sugar once daily, [employer] Microlet Lancets. Dx E11.9   mupirocin ointment 2 % Commonly known as: BACTROBAN Place 1 application into the nose 2 (two) times daily.   pantoprazole 40 MG tablet Commonly known as: PROTONIX Take 1 tablet (40 mg total) by mouth daily.   polyethylene glycol 17 g packet Commonly known as: MIRALAX / GLYCOLAX Take 17 g by mouth daily.   senna-docusate 8.6-50 MG tablet Commonly known as: Senokot-S Take 1 tablet by mouth 2 (two) times daily.   spironolactone 25 MG tablet Commonly known as: ALDACTONE Take 1 tablet by mouth once daily   traMADol 50 MG tablet Commonly known as: ULTRAM Take 1 tablet (50 mg total) by mouth 2 (two) times daily as needed (pain.).  Durable Medical Equipment  (From admission, onward)         Start     Ordered   06/06/19 1206  For home use only DME 3 n 1  Once     06/06/19 1205   06/04/19 1316  For home use only DME Walker rolling  Once    Question Answer Comment  Walker: With 5 Inch Wheels   Patient needs a walker to treat with the following condition Diabetic foot ulcer ([place])      06/04/19 1315         Follow-up Information    Schnier, Dolores Lory, MD On 07/06/2019.   Specialties: Vascular Surgery, Cardiology, Radiology, Vascular Surgery Why: Can see Schnier or Arna Medici. Will need ABI. AT 0930.  YOU WILL SEE DR. FALTON Contact information: [place] [address] [phone]        Einar Pheasant, MD In 1 week.   Specialty: Internal Medicine Why: OFFICE TO CALL WITH APPOINTMENT Contact information: [address] [place] [address] [phone]        Albertine Patricia, DPM On 07/07/2019.   Specialty: Podiatry Why: AT [phone] Contact information: [facility] [place] [address] [place] Follow up.   Why: They will follow up with you for your home health needs.         Allergies  Allergen Reactions  . Maxzide [Triamterene-Hctz] Other (See Comments)    Weakness/fatigue     Consultations:  Vascular surgery  Podiatry    Procedures/Studies: PERIPHERAL VASCULAR CATHETERIZATION  Result Date: 06/03/2019 See Op Note  PERIPHERAL VASCULAR CATHETERIZATION  Result Date: 05/16/2019  [id number] LOCATION:  FACILITY: [facility] PHYSICIAN: Quay Burow, M.D. [birth year] DATE OF PROCEDURE:  05/16/2019 DATE OF DISCHARGE: PV Angiogram/Intervention History obtained from chart review.AZANI BROGDON is a 79 y.o.  moderately overweight married Caucasian female mother of 2, grandmother of 2 grandchildren referred by Dr. Dellia Nims, wound care specialist, for peripheral vascular evaluation because of critical limb ischemia. She is accompanied by her husband [name] today.   I last saw her in the office 04/12/2019 her risk factors include treated hypertension hyperlipidemia. Her father may have died of a myocardial infarction. She is never had a heart attack or stroke. She denies chest pain or shortness of breath. 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 She is minimally ambulatory walks with a cane at home and in with and gets around in a wheelchair otherwise. She does not denies claudication. She had a wound to the lateral aspect of her left foot for approximately 3 months. She is unaware of how this developed. She sees Dr. Dellia Nims at the wound care clinic who debrides her on a routine basis. The wound has been slow to heal. She had Dopplers performed in our office 03/30/2019 revealing a left ABI of 0.83 with a high-frequency signal in the mid left SFA.  Since I saw her 2 weeks ago she developed some discoloration on her left  first and fourth toes with some increasing pain.  She saw Dr. Dellia Nims back in the wound care center and he felt that she should proceed with angiography and revascularization.  Pre Procedure Diagnosis: Critical limb ischemia Post Procedure Diagnosis: Critical limb ischemia Operators: Dr. Quay Burow Procedures Performed:  1.  Right common femoral access  2.  Abdominal aortogram/bilateral iliac angiogram  3.  Contralateral access (secondary  cath placement)  4.  Chocolate balloon angioplasty followed by drug-coated balloon angioplasty mid left SFA PROCEDURE DESCRIPTION: The patient was brought to the second floor [facility] cath lab in the the postabsorptive state. She was premedicated with IV Versed and fentanyl. Her right groin was prepped and shaved in usual sterile fashion. Xylocaine 1% was used for local anesthesia. A 5 French sheath was inserted into the right common femoral artery using standard Seldinger technique.  A 5 French pigtail catheters placed in the distal abdominal aorta.  Distal abdominal aortography, bilateral iliac angiography was performed.  Contralateral access was then obtained with a crossover catheter and endhole catheter.  Left lower extremity angiography runoff was performed using bolus chase, digital subtraction and step table technique.  Visipaque dye was used for the entirety of the case.  Retrograde or pressures monitored to the case.  Angiographic Data: 1: Abdominal aorta-renal arteries widely patent.  The infrarenal abdominal aorta was free of significant atherosclerotic changes.  Both iliac arteries appeared normal 2: Left lower extremity-there was a 60% hypodense lesion mid left SFA with one-vessel runoff via the peroneal.  The peroneal gave collaterals to the dorsalis pedis to the level of the ankle.   Ms. Fanguy has a moderate lesion in the mid left SFA that was significant by duplex ultrasound with one-vessel runoff via the peroneal which gives collaterals to the  dorsalis pedis at the level of the ankle.  We will proceed with chocolate balloon angioplasty followed by [facility] of the mid left SFA.  She does have chronic renal insufficiency with a creatinine clearance of 35 to 40 cc/min. Procedure Description: The patient received 10 calciums of heparin with an ACT of 274.  Total contrast administered to the patient was 140 cc.  Using a 0.14 [place] core wire across the mid left SFA lesion and performed chocolate balloon angioplasty with a 5 mm x 4 cm chocolate balloon at nominal pressures.  Following this, I performed DCB with a 6 mm x 6 cm impact Admiral balloon at 4 atm for 20 half minutes resulting reduction with 60% hypodense mid left SFA stenosis to 0% residual.  There is no dissection.  There is excellent flow.  I was unable to determine where the origin of the AT and PT were. Final Impression: Successful chocolate balloon followed by DCB of mid left SFA lesion corresponding to high-frequency signal in the mid left SFA by duplex ultrasound the setting of critical limb ischemia.  Angiographically this appeared to be at most 60%, more by duplex ultrasound with a collateral to came out just proximal to it suggesting hemodynamic significance.  There was no entrance to the AT or PT making antegrade access to the tibial vessels not possible.  Given her chronic renal insufficiency and the fact we used 140 cc of contrast that did not think it was reasonable to pursue an tibial pedal access at this time.  We will follow her clinically.  If she does not improve she may need dorsalis pedal access with potential recanalization retrograde of her anterior tibial artery.  The sheath was removed across the bifurcation and exchanged over an 035 wire for a short 6 [place] sheath.  A femoral angiogram was performed and a 6/7 MYNX closure device was successfully deployed.  The patient left lab in stable condition.  She will be hydrated overnight, discharged home the morning.  We will get Dopplers  in our [facility] line office next week and I will see her back the week  after. Quay Burow. MD, [facility] 05/16/2019 11:59 AM   DG Chest Port 1 View  Result Date: 06/05/2019 CLINICAL DATA:  Fever. History of colon cancer hypertension and pulmonary fibrosis. EXAM: PORTABLE CHEST 1 VIEW COMPARISON:  06/17/2018 FINDINGS: The heart size is mildly enlarged. The pulmonary arteries are dilated. Prominent interstitial lung markings are noted which may be related to the patient's reported history of pulmonary fibrosis. There is atelectasis versus scarring at the left lung base. There is no pneumothorax or large pleural effusion. There is no acute osseous abnormality. IMPRESSION: No active disease. Electronically Signed   By: Constance Holster M.D.   On: 06/05/2019 19:09   VAS [place] ABI WITH/WO TBI  Result Date: 05/31/2019 LOWER EXTREMITY DOPPLER STUDY Indications: Peripheral artery disease, s/p left SFA angioplasty on 05/15/18 for              critical limb ischemia. The patient is minimally ambulatory and              walks with a cane but gets around in a wheelchair otherwise. She              states that her leg doesn't feel any different since the procedure,              and the wound on her left lateral foot has been slow to heal, does              not feel or notice much of a difference yet. Her first and fourth              toes have become increasingly painful and sensitive with              discoloration and patient states she was told she needs them              removed (dry gangrene). High Risk         Hypertension, hyperlipidemia, Diabetes, past history of Factors:          smoking.  Comparison Study: Previous ABIs in 11/20 were 0.98 on the right and 0.83 on the                   left. Performing Technologist: Mariane Masters RVT  Examination Guidelines: A complete evaluation includes at minimum, Doppler waveform signals and systolic blood pressure reading at the level of bilateral brachial, anterior tibial, and  posterior tibial arteries, when vessel segments are accessible. Bilateral testing is considered an integral part of a complete examination. Photoelectric Plethysmograph (PPG) waveforms and toe systolic pressure readings are included as required and additional duplex testing as needed. Limited examinations for reoccurring indications may be performed as noted.  ABI Findings: +---------+------------------+-----+---------+--------+ Right    Rt Pressure (mmHg)IndexWaveform Comment  +---------+------------------+-----+---------+--------+ Brachial 187                                      +---------+------------------+-----+---------+--------+ ATA      176               0.94 biphasic          +---------+------------------+-----+---------+--------+ PTA      177               0.95 biphasic          +---------+------------------+-----+---------+--------+ PERO     180  0.96 triphasic         +---------+------------------+-----+---------+--------+ Great Toe94                0.50 Abnormal          +---------+------------------+-----+---------+--------+ +---------+------------------+-----+----------+-------+ Left     Lt Pressure (mmHg)IndexWaveform  Comment +---------+------------------+-----+----------+-------+ Brachial 173                                      +---------+------------------+-----+----------+-------+ ATA      181               0.97 monophasic        +---------+------------------+-----+----------+-------+ PTA      170               0.91 monophasic        +---------+------------------+-----+----------+-------+ PERO     177               0.95 monophasic        +---------+------------------+-----+----------+-------+ Great Toe48                0.26 Abnormal          +---------+------------------+-----+----------+-------+ +-------+-----------+-----------+------------+------------+ ABI/TBIToday's ABIToday's TBIPrevious ABIPrevious  TBI +-------+-----------+-----------+------------+------------+ Right  0.96       0.50       0.98        0.46         +-------+-----------+-----------+------------+------------+ Left   0.97       0.26       0.83        0.32         +-------+-----------+-----------+------------+------------+ Right ABIs appear essentially unchanged compared to prior study on 03/30/19. Left ABIs appear increased compared to prior study on 03/30/19.  Summary: Right: Resting right ankle-brachial index is within normal range. No evidence of significant right lower extremity arterial disease. The right toe-brachial index is abnormal. Left: Resting left ankle-brachial index is within normal range. No evidence of significant left lower extremity arterial disease. The left toe-brachial index is abnormal. Although ankle brachial indices are within normal limits (0.95-1.29), arterial Doppler waveforms at the ankle suggest some component of arterial occlusive disease.  *See table(s) above for measurements and observations. See arterial duplex report.  Suggest follow up study in 6 months. Electronically signed by Ena Dawley MD on 05/31/2019 at 9:43:11 AM.    Final    VAS [place] LOWER EXTREMITY ARTERIAL DUPLEX  Result Date: 05/31/2019 LOWER EXTREMITY ARTERIAL DUPLEX STUDY Indications: Peripheral artery disease, s/p left SFA angioplasty on 05/15/18 for              critical limb ischemia. The patient is minimally ambulatory and              walks with a cane but gets around in a wheelchair otherwise. She              states that her leg doesn't feel any different since the procedure,              and the wound on her left lateral foot has been slow to heal, does              not feel or notice much of a difference yet. Her first and fourth              toes have become increasingly painful and sensitive with  discoloration and patient states that she was told she needs them              removed (dry gangrene). High Risk  Factors: Hypertension, hyperlipidemia, Diabetes, past history of                    smoking.  Vascular Interventions: S/P drug-coated balloon angiopplasty of the mid left SFA                         on 05/15/18 by Dr. Gwenlyn Found. Current ABI:            Today's ABIs were 0.96 on the right and 0.97 on the                         left. Comparison Study: In 11/18, an arterial duplex showed PSV of 400 cm/sec in the                   mid left SFA. Performing Technologist: Mariane Masters RVT  Examination Guidelines: A complete evaluation includes B-mode imaging, spectral Doppler, color Doppler, and power Doppler as needed of all accessible portions of each vessel. Bilateral testing is considered an integral part of a complete examination. Limited examinations for reoccurring indications may be performed as noted.  +----------+--------+-----+--------+----------------+--------+ LEFT      PSV cm/sRatioStenosisWaveform        Comments +----------+--------+-----+--------+----------------+--------+ CFA Prox  126                  nearly triphasic         +----------+--------+-----+--------+----------------+--------+ DFA       112                  nearly triphasic         +----------+--------+-----+--------+----------------+--------+ SFA Prox  138                  triphasic                +----------+--------+-----+--------+----------------+--------+ SFA Mid   112                  nearly triphasic         +----------+--------+-----+--------+----------------+--------+ SFA Distal105                  monophasic               +----------+--------+-----+--------+----------------+--------+ POP Prox  98                   monophasic               +----------+--------+-----+--------+----------------+--------+ POP Distal106                  monophasic               +----------+--------+-----+--------+----------------+--------+ TP Trunk  108                  monophasic                +----------+--------+-----+--------+----------------+--------+ The left SFA is widely patent, s/p angioplasty. Moderately hyperemic, monophasic waveforms are seen in the distal SFA-tibioperoneal trunk arteries.  Summary: Left: No evidence of significant stenosis of the left lower extremity, s/p left SFA mid angioplasty. Marked improvement is noted compared to previous exam.  See table(s) above for measurements and observations. See ABI report. Suggest follow up study in 6  months. Electronically signed by Ena Dawley MD on 05/31/2019 at 9:40:00 AM.    Final        Subjective: Pt c/o fatigue    Discharge Exam: Vitals:   06/09/19 0103 06/09/19 0820  BP: (!) 123/59 (!) 150/107  Pulse: 63 62  Resp:  16  Temp:  98.5 F (36.9 C)  SpO2:  95%   Vitals:   06/08/19 1559 06/09/19 0035 06/09/19 0103 06/09/19 0820  BP: (!) 108/55 (!) 108/45 (!) 123/59 (!) 150/107  Pulse: (!) 58 61 63 62  Resp: '16 14  16  ' Temp: 98 F (36.7 C) 98 F (36.7 C)  98.5 F (36.9 C)  TempSrc: Oral Oral  Oral  SpO2: 90% (!) 87%  95%  Weight:      Height:        General: Pt is alert, awake, not in acute distress Cardiovascular:S1/S2 +, no rubs, no gallops Respiratory: diminished breath sounds b/l, no rales, no rhonchi Abdominal: Soft, NT, obese, bowel sounds + Extremities: b/l LE edema, no cyanosis    The results of significant diagnostics from this hospitalization (including imaging, microbiology, ancillary and laboratory) are listed below for reference.     Microbiology: Recent Results (from the past 240 hour(s))  Surgical PCR screen     Status: None   Collection Time: 06/01/19  2:38 PM   Specimen: Nasal Mucosa; Nasal Swab  Result Value Ref Range Status   MRSA, PCR NEGATIVE NEGATIVE Final   Staphylococcus aureus NEGATIVE NEGATIVE Final    Comment: (NOTE) The Xpert SA Assay (FDA approved for NASAL specimens in patients 12 years of age and older), is one component of a comprehensive surveillance  program. It is not intended to diagnose infection nor to guide or monitor treatment. Performed at [facility], [address] [place], [place] [address]   Respiratory Panel by RT PCR (Flu A&B, Covid) - Nasopharyngeal Swab     Status: None   Collection Time: 06/01/19  5:15 PM   Specimen: Nasopharyngeal Swab  Result Value Ref Range Status   SARS Coronavirus 2 by RT PCR NEGATIVE NEGATIVE Final    Comment: (NOTE) SARS-CoV-2 target nucleic acids are NOT DETECTED. The SARS-CoV-2 RNA is generally detectable in upper respiratoy specimens during the acute phase of infection. The lowest concentration of SARS-CoV-2 viral copies this assay can detect is 131 copies/mL. A negative result does not preclude SARS-Cov-2 infection and should not be used as the sole basis for treatment or other patient management decisions. A negative result may occur with  improper specimen collection/handling, submission of specimen other than nasopharyngeal swab, presence of viral mutation(s) within the areas targeted by this assay, and inadequate number of viral copies (<131 copies/mL). A negative result must be combined with clinical observations, patient history, and epidemiological information. The expected result is Negative. Fact Sheet for Patients:  PinkCheek.be Fact Sheet for Healthcare Providers:  GravelBags.it This test is not yet ap proved or cleared by the [place] FDA and  has been authorized for detection and/or diagnosis of SARS-CoV-2 by FDA under an Emergency Use Authorization (EUA). This EUA will remain  in effect (meaning this test can be used) for the duration of the COVID-19 declaration under Section 564(b)(1) of the Act, 21 U.S.C. section 360bbb-3(b)(1), unless the authorization is terminated or revoked sooner.    Influenza A by PCR NEGATIVE NEGATIVE Final   Influenza B by PCR NEGATIVE NEGATIVE Final    Comment:  (NOTE) The Xpert Xpress SARS-CoV-2/FLU/RSV assay is  intended as an aid in  the diagnosis of influenza from Nasopharyngeal swab specimens and  should not be used as a sole basis for treatment. Nasal washings and  aspirates are unacceptable for Xpert Xpress SARS-CoV-2/FLU/RSV  testing. Fact Sheet for Patients: PinkCheek.be Fact Sheet for Healthcare Providers: GravelBags.it This test is not yet approved or cleared by the [place] FDA and  has been authorized for detection and/or diagnosis of SARS-CoV-2 by  FDA under an Emergency Use Authorization (EUA). This EUA will remain  in effect (meaning this test can be used) for the duration of the  Covid-19 declaration under Section 564(b)(1) of the Act, 21  U.S.C. section 360bbb-3(b)(1), unless the authorization is  terminated or revoked. Performed at [facility], [address], [place], [place] [address]   Aerobic Culture (superficial specimen)     Status: None   Collection Time: 06/04/19 10:19 AM   Specimen: Foot; Wound  Result Value Ref Range Status   Specimen Description   Final    FOOT Performed at [facility], [address] [place], [place] [address]    Special Requests   Final    Normal Performed at [facility], [place], [place] [address]    Gram Stain   Final    MODERATE WBC PRESENT, PREDOMINANTLY PMN MODERATE GRAM NEGATIVE RODS FEW GRAM POSITIVE COCCI Performed at [facility], [place] [address], [place], [place] [address]    Culture   Final    ABUNDANT PSEUDOMONAS AERUGINOSA ABUNDANT SERRATIA MARCESCENS FEW ENTEROCOCCUS FAECALIS    Report Status 06/09/2019 FINAL  Final   Organism ID, Bacteria PSEUDOMONAS AERUGINOSA  Final   Organism ID, Bacteria SERRATIA MARCESCENS  Final   Organism ID, Bacteria ENTEROCOCCUS FAECALIS  Final      Susceptibility   Enterococcus faecalis - MIC*    AMPICILLIN <=2 SENSITIVE  Sensitive     VANCOMYCIN 2 SENSITIVE Sensitive     GENTAMICIN SYNERGY SENSITIVE Sensitive     * FEW ENTEROCOCCUS FAECALIS   Pseudomonas aeruginosa - MIC*    CEFTAZIDIME 4 SENSITIVE Sensitive     CIPROFLOXACIN <=0.25 SENSITIVE Sensitive     GENTAMICIN 2 SENSITIVE Sensitive     IMIPENEM 2 SENSITIVE Sensitive     PIP/TAZO 8 SENSITIVE Sensitive     CEFEPIME 2 SENSITIVE Sensitive     * ABUNDANT PSEUDOMONAS AERUGINOSA   Serratia marcescens - MIC*    CEFAZOLIN >=64 RESISTANT Resistant     CEFEPIME <=0.12 SENSITIVE Sensitive     CEFTAZIDIME <=1 SENSITIVE Sensitive     CEFTRIAXONE <=0.25 SENSITIVE Sensitive     CIPROFLOXACIN <=0.25 SENSITIVE Sensitive     GENTAMICIN <=1 SENSITIVE Sensitive     TRIMETH/SULFA <=20 SENSITIVE Sensitive     * ABUNDANT SERRATIA MARCESCENS  CULTURE, BLOOD (ROUTINE X 2) w Reflex to ID Panel     Status: None (Preliminary result)   Collection Time: 06/05/19  5:39 PM   Specimen: BLOOD  Result Value Ref Range Status   Specimen Description BLOOD LAC  Final   Special Requests   Final    BOTTLES DRAWN AEROBIC AND ANAEROBIC Blood Culture results may not be optimal due to an excessive volume of blood received in culture bottles   Culture   Final    NO GROWTH 4 DAYS Performed at [facility], [address] [place], [place] [address]    Report Status PENDING  Incomplete  CULTURE, BLOOD (ROUTINE X 2) w Reflex to ID Panel  Status: None (Preliminary result)   Collection Time: 06/05/19  5:46 PM   Specimen: BLOOD  Result Value Ref Range Status   Specimen Description BLOOD RAC  Final   Special Requests   Final    BOTTLES DRAWN AEROBIC AND ANAEROBIC Blood Culture adequate volume   Culture   Final    NO GROWTH 4 DAYS Performed at [facility], [address], [place], [place] [address]    Report Status PENDING  Incomplete  Aerobic/Anaerobic Culture (surgical/deep wound)     Status: None (Preliminary result)   Collection Time: 06/07/19  2:06 PM    Specimen: PATH Other; Body Fluid  Result Value Ref Range Status   Specimen Description   Final    FOOT LEFT FOOT Performed at [facility], [address], [place], [place] [address]    Special Requests   Final    NONE Performed at [facility], [address] [place], [place] [address]    Gram Stain NO WBC SEEN NO ORGANISMS SEEN   Final   Culture   Final    FEW SERRATIA MARCESCENS RARE PSEUDOMONAS AERUGINOSA SUSCEPTIBILITIES TO FOLLOW FOR ORGANISM 2 CULTURE REINCUBATED FOR BETTER GROWTH Performed at [facility], [place] [address], [place], [place] [address]    Report Status PENDING  Incomplete     Labs: BNP (last 3 results) No results for input(s): BNP in the last 8760 hours. Basic Metabolic Panel: Recent Labs  Lab 06/03/19 0416 06/04/19 0735 06/08/19 0636 06/08/19 0855 06/09/19 0644 06/09/19 1101  NA 137 135  --  132* 134*  --   K 4.9 4.6  --  5.1 5.2* 4.9  CL 101 98  --  100 100  --   CO2 28 29  --  23 29  --   GLUCOSE 82 91  --  134* 88  --   BUN 19 15  --  34* 33*  --   CREATININE 1.10* 1.04* 0.98 0.83 0.92  --   CALCIUM 9.8 9.6  --  9.6 9.7  --   MG 1.4*  --   --   --   --   --    Liver Function Tests: No results for input(s): AST, ALT, ALKPHOS, BILITOT, PROT, ALBUMIN in the last 168 hours. No results for input(s): LIPASE, AMYLASE in the last 168 hours. No results for input(s): AMMONIA in the last 168 hours. CBC: Recent Labs  Lab 06/03/19 0416 06/04/19 0735 06/08/19 0855 06/09/19 0644  WBC 6.6 6.0 8.5 8.1  HGB 12.6 12.6 12.3 10.9*  HCT 39.7 39.8 38.5 34.8*  MCV 98.3 97.8 97.2 98.6  PLT 120* 112* 196 206   Cardiac Enzymes: No results for input(s): CKTOTAL, CKMB, CKMBINDEX, TROPONINI in the last 168 hours. BNP: Invalid input(s): POCBNP CBG: Recent Labs  Lab 06/08/19 1151 06/08/19 1654 06/08/19 2127 06/09/19 0819 06/09/19 1158  GLUCAP 154* 103* 101* 85 110*   D-Dimer No results for input(s): DDIMER in the last 72  hours. Hgb A1c No results for input(s): HGBA1C in the last 72 hours. Lipid Profile No results for input(s): CHOL, HDL, LDLCALC, TRIG, CHOLHDL, LDLDIRECT in the last 72 hours. Thyroid function studies No results for input(s): TSH, T4TOTAL, T3FREE, THYROIDAB in the last 72 hours.  Invalid input(s): FREET3 Anemia work up No results for input(s): VITAMINB12, FOLATE, FERRITIN, TIBC, IRON, RETICCTPCT in the last 72 hours. Urinalysis    Component Value Date/Time   COLORURINE YELLOW 11/24/2018 [place]  11/24/2018 1004   LABSPEC 1.025 11/24/2018 1004   PHURINE 5.5 11/24/2018 1004   GLUCOSEU NEGATIVE 11/24/2018 1004   HGBUR NEGATIVE 11/24/2018 1004   BILIRUBINUR SMALL (A) 11/24/2018 1004   KETONESUR NEGATIVE 11/24/2018 1004   UROBILINOGEN 1.0 11/24/2018 1004   NITRITE NEGATIVE 11/24/2018 1004   LEUKOCYTESUR TRACE (A) 11/24/2018 1004   Sepsis Labs Invalid input(s): PROCALCITONIN,  WBC,  LACTICIDVEN Microbiology Recent Results (from the past 240 hour(s))  Surgical PCR screen     Status: None   Collection Time: 06/01/19  2:38 PM   Specimen: Nasal Mucosa; Nasal Swab  Result Value Ref Range Status   MRSA, PCR NEGATIVE NEGATIVE Final   Staphylococcus aureus NEGATIVE NEGATIVE Final    Comment: (NOTE) The Xpert SA Assay (FDA approved for NASAL specimens in patients 79 years of age and older), is one component of a comprehensive surveillance program. It is not intended to diagnose infection nor to guide or monitor treatment. Performed at [facility], [address] [place], [place] [address]   Respiratory Panel by RT PCR (Flu A&B, Covid) - Nasopharyngeal Swab     Status: None   Collection Time: 06/01/19  5:15 PM   Specimen: Nasopharyngeal Swab  Result Value Ref Range Status   SARS Coronavirus 2 by RT PCR NEGATIVE NEGATIVE Final    Comment: (NOTE) SARS-CoV-2 target nucleic acids are NOT DETECTED. The SARS-CoV-2 RNA is generally detectable in upper  respiratoy specimens during the acute phase of infection. The lowest concentration of SARS-CoV-2 viral copies this assay can detect is 131 copies/mL. A negative result does not preclude SARS-Cov-2 infection and should not be used as the sole basis for treatment or other patient management decisions. A negative result may occur with  improper specimen collection/handling, submission of specimen other than nasopharyngeal swab, presence of viral mutation(s) within the areas targeted by this assay, and inadequate number of viral copies (<131 copies/mL). A negative result must be combined with clinical observations, patient history, and epidemiological information. The expected result is Negative. Fact Sheet for Patients:  PinkCheek.be Fact Sheet for Healthcare Providers:  GravelBags.it This test is not yet ap proved or cleared by the [place] FDA and  has been authorized for detection and/or diagnosis of SARS-CoV-2 by FDA under an Emergency Use Authorization (EUA). This EUA will remain  in effect (meaning this test can be used) for the duration of the COVID-19 declaration under Section 564(b)(1) of the Act, 21 U.S.C. section 360bbb-3(b)(1), unless the authorization is terminated or revoked sooner.    Influenza A by PCR NEGATIVE NEGATIVE Final   Influenza B by PCR NEGATIVE NEGATIVE Final    Comment: (NOTE) The Xpert Xpress SARS-CoV-2/FLU/RSV assay is intended as an aid in  the diagnosis of influenza from Nasopharyngeal swab specimens and  should not be used as a sole basis for treatment. Nasal washings and  aspirates are unacceptable for Xpert Xpress SARS-CoV-2/FLU/RSV  testing. Fact Sheet for Patients: PinkCheek.be Fact Sheet for Healthcare Providers: GravelBags.it This test is not yet approved or cleared by the [place] FDA and  has been authorized for  detection and/or diagnosis of SARS-CoV-2 by  FDA under an Emergency Use Authorization (EUA). This EUA will remain  in effect (meaning this test can be used) for the duration of the  Covid-19 declaration under Section 564(b)(1) of the Act, 21  U.S.C. section 360bbb-3(b)(1), unless the authorization is  terminated or revoked. Performed at [facility], [address] [place], [place] [address]  Aerobic Culture (superficial specimen)     Status: None   Collection Time: 06/04/19 10:19 AM   Specimen: Foot; Wound  Result Value Ref Range Status   Specimen Description   Final    FOOT Performed at [facility], [address] [place], [place] [address]    Special Requests   Final    Normal Performed at [facility], [place], [place] [address]    Gram Stain   Final    MODERATE WBC PRESENT, PREDOMINANTLY PMN MODERATE GRAM NEGATIVE RODS FEW GRAM POSITIVE COCCI Performed at [facility], [place] [address] [place], [place] [address]    Culture   Final    ABUNDANT PSEUDOMONAS AERUGINOSA ABUNDANT SERRATIA MARCESCENS FEW ENTEROCOCCUS FAECALIS    Report Status 06/09/2019 FINAL  Final   Organism ID, Bacteria PSEUDOMONAS AERUGINOSA  Final   Organism ID, Bacteria SERRATIA MARCESCENS  Final   Organism ID, Bacteria ENTEROCOCCUS FAECALIS  Final      Susceptibility   Enterococcus faecalis - MIC*    AMPICILLIN <=2 SENSITIVE Sensitive     VANCOMYCIN 2 SENSITIVE Sensitive     GENTAMICIN SYNERGY SENSITIVE Sensitive     * FEW ENTEROCOCCUS FAECALIS   Pseudomonas aeruginosa - MIC*    CEFTAZIDIME 4 SENSITIVE Sensitive     CIPROFLOXACIN <=0.25 SENSITIVE Sensitive     GENTAMICIN 2 SENSITIVE Sensitive     IMIPENEM 2 SENSITIVE Sensitive     PIP/TAZO 8 SENSITIVE Sensitive     CEFEPIME 2 SENSITIVE Sensitive     * ABUNDANT PSEUDOMONAS AERUGINOSA   Serratia marcescens - MIC*    CEFAZOLIN >=64 RESISTANT Resistant     CEFEPIME <=0.12 SENSITIVE Sensitive      CEFTAZIDIME <=1 SENSITIVE Sensitive     CEFTRIAXONE <=0.25 SENSITIVE Sensitive     CIPROFLOXACIN <=0.25 SENSITIVE Sensitive     GENTAMICIN <=1 SENSITIVE Sensitive     TRIMETH/SULFA <=20 SENSITIVE Sensitive     * ABUNDANT SERRATIA MARCESCENS  CULTURE, BLOOD (ROUTINE X 2) w Reflex to ID Panel     Status: None (Preliminary result)   Collection Time: 06/05/19  5:39 PM   Specimen: BLOOD  Result Value Ref Range Status   Specimen Description BLOOD LAC  Final   Special Requests   Final    BOTTLES DRAWN AEROBIC AND ANAEROBIC Blood Culture results may not be optimal due to an excessive volume of blood received in culture bottles   Culture   Final    NO GROWTH 4 DAYS Performed at [facility], [address] [place], [place] [address]    Report Status PENDING  Incomplete  CULTURE, BLOOD (ROUTINE X 2) w Reflex to ID Panel     Status: None (Preliminary result)   Collection Time: 06/05/19  5:46 PM   Specimen: BLOOD  Result Value Ref Range Status   Specimen Description BLOOD RAC  Final   Special Requests   Final    BOTTLES DRAWN AEROBIC AND ANAEROBIC Blood Culture adequate volume   Culture   Final    NO GROWTH 4 DAYS Performed at [facility], [address] [place], [place] [address]    Report Status PENDING  Incomplete  Aerobic/Anaerobic Culture (surgical/deep wound)     Status: None (Preliminary result)   Collection Time: 06/07/19  2:06 PM   Specimen: PATH Other; Body Fluid  Result Value Ref Range Status   Specimen Description   Final    FOOT LEFT FOOT Performed at [facility], [place], [place] [address]    Special Requests   Final    NONE Performed at [facility], [place], [place] [address]    Gram Stain NO WBC SEEN NO ORGANISMS SEEN   Final   Culture   Final    FEW SERRATIA MARCESCENS RARE PSEUDOMONAS AERUGINOSA SUSCEPTIBILITIES TO FOLLOW FOR ORGANISM 2 CULTURE REINCUBATED FOR BETTER  GROWTH Performed at [facility], [place] [address] [place],  [address]    Report Status PENDING  Incomplete     Time coordinating discharge: Over 30 minutes  SIGNED:   Wyvonnia Dusky, MD  [facility] 06/09/2019, 1:58 PM Pager   If 7PM-7AM, please contact night-coverage [email or web address] Password [email or web address]

## 2019-06-09 NOTE — Progress Notes (Signed)
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 PMH includes DM, RA, HTN, gerd, PAD.    PT Comments    Pt was supine in bed with HOB elevated upon arriving.Spouse present throughout. She receive pain meds prior to session and is agreeable to PT session. BP while resting in bed prior to OOB activity 109/67. She reports having no pain but does c/o "I just dont feel right today." O2 98% and HR 70bpm. She was able to perform supine to short sit EOB with min assist with assisting LEs to EOB + increased time to perform. Pt sat EOB x several minutes prior to performing standing at EOB. She stood 2 x EOB ~ 1 minute each trial. She requested to have BM and did perform stand step from EOB to [facility] using RW but required stand pivot to return 2* to fatigue. Max assist for safety + vcs throughout for technique and sequencing. Pt was repositioned in bed post session with call bell in reach and bed alarm set. BP recheck once safely in bed 98/54. RN aware. Pt does not feel safe to d/c home this date. PT continues to recommend pt go to SNF at d/c but pt/ pt's spouse would prefer to d/c to home with 24 hour supervision. MD notified by RN about pt not wanting to d/c this date. Overall pt is progressing with PT but limited by medical status/ fatigue.    Follow Up Recommendations  SNF     Equipment Recommendations  Rolling walker with 5" wheels;3in1 (PT)    Recommendations for Other Services       Precautions / Restrictions Precautions Precautions: Fall Restrictions Weight Bearing Restrictions: Yes LLE Weight Bearing: Weight bearing as tolerated Other Position/Activity Restrictions: Darco post op note    Mobility  Bed Mobility Overal bed mobility: Needs Assistance Bed  Mobility: Supine to Sit;Sit to Supine     Supine to sit: Min assist;HOB elevated Sit to supine: Mod assist;Max assist;HOB elevated   General bed mobility comments: Pt required assistant with BLEs and trunk control to achieve EOB short sit. Required increased assistance returning to supine from EOB with BLEs assist and vcs for technique and sequencing.   Transfers Overall transfer level: Needs assistance Equipment used: Rolling walker (2 wheeled) Transfers: Stand Pivot Transfers;Sit to/from Stand Sit to Stand: Mod assist;Max assist Stand pivot transfers: Max assist       General transfer comment: Pt performed sit to stand EOB to FWW 2 x prior to stand pivot to/from [facility]  Ambulation/Gait                 Stairs             Wheelchair Mobility    Modified Rankin (Stroke Patients Only)       Balance Overall balance assessment: Needs assistance Sitting-balance support: Feet supported Sitting balance-Leahy Scale: Fair Sitting balance - Comments: pt able to sit EOB with BLEs on floor and UE support x several minutes with CGA for safety.  Postural control: Posterior lean Standing balance support: Bilateral upper extremity supported Standing balance-Leahy Scale: Fair Standing balance comment: pt stood x 2 minute EOB.  Cognition Arousal/Alertness: Awake/alert Behavior During Therapy: WFL for tasks assessed/performed Overall Cognitive Status: Within Functional Limits for tasks assessed                                 General Comments: Pt is Alert and Oriented throughout sesison      Exercises      General Comments        Pertinent Vitals/Pain Pain Assessment: No/denies pain Pain Score: 0-No pain Pain Location: L foot    Home Living                      Prior Function            PT Goals (current goals can now be found in the care plan section) Acute Rehab PT Goals Patient Stated Goal:  " I just dont feel well today" Progress towards PT goals: Progressing toward goals    Frequency    Min 2X/week      PT Plan Current plan remains appropriate    Co-evaluation              AM-PAC PT "6 Clicks" Mobility   Outcome Measure  Help needed turning from your back to your side while in a flat bed without using bedrails?: A Lot Help needed moving from lying on your back to sitting on the side of a flat bed without using bedrails?: A Lot Help needed moving to and from a bed to a chair (including a wheelchair)?: A Lot Help needed standing up from a chair using your arms (e.g., wheelchair or bedside chair)?: A Lot Help needed to walk in hospital room?: A Lot Help needed climbing 3-5 steps with a railing? : Total 6 Click Score: 11    End of Session Equipment Utilized During Treatment: Gait belt;Other (comment)(Darco shoes) Activity Tolerance: Patient limited by fatigue Patient left: in bed;with call bell/phone within reach;with bed alarm set;with family/visitor present Nurse Communication: Mobility status PT Visit Diagnosis: Muscle weakness (generalized) (M62.81);Other abnormalities of gait and mobility (R26.89);Pain;Difficulty in walking, not elsewhere classified (R26.2) Pain - Right/Left: Left Pain - part of body: Ankle and joints of foot     Time: 1355-1430 PT Time Calculation (min) (ACUTE ONLY): 35 min  Charges:  $Therapeutic Activity: 23-37 mins                     Julaine Fusi PTA 06/09/19, 2:51 PM

## 2019-06-10 ENCOUNTER — Telehealth: Payer: Self-pay

## 2019-06-10 ENCOUNTER — Ambulatory Visit: Payer: Medicare Other | Admitting: Internal Medicine

## 2019-06-10 LAB — CULTURE, BLOOD (ROUTINE X 2)
Culture: NO GROWTH
Culture: NO GROWTH
Special Requests: ADEQUATE

## 2019-06-10 NOTE — Telephone Encounter (Signed)
Transition Care Management Follow-up Telephone Call  Date of discharge and from where: 06/09/19 from [facility].  How have you been since you were released from the hospital? Overall I am doing good. I really appreciate the referrals recently placed by Dr. Nicki Reaper. Denies nausea, vomiting, diarrhea. Bandage intact/dry. Home health in progress. Appetite okay and trying to drink an appropriate amount of water.  Any questions or concerns? None at this time.  Items Reviewed:  Did the pt receive and understand the discharge instructions provided? Yes.   Medications obtained and verified? Yes.   Any new allergies since your discharge? No.  Dietary orders reviewed? Low sodium heart healthy.  Do you have support at home? Yes, husband and daughter.  Functional Questionnaire: (I = Independent and D = Dependent) ADLs: Assisted by husband and daughter as needed.  Bathing/Dressing- D  Meal Prep- D- husband assists  Eating- I  Maintaining continence- Bed pan in use.   Transferring/Ambulation- D- Wheelchair, walker, cane.  Managing Meds- D-managed by husband and daughter.  Follow up appointments reviewed:   PCP Hospital f/u appt confirmed? Yes, Scheduled to see Dr. Nicki Reaper on 06/17/19 @ 11:00.  Podiatry appt confirmed? Yes 06/13/19 @ 10:15.  Vascular appt confirmed? Yes 07/06/19 @ 9:30.  Are transportation arrangements needed? No.  If their condition worsens, is the pt aware to call PCP or go to the Emergency Dept.? Yes.  Was the patient provided with contact information for the PCP's office or ED? Yes.  Was to pt encouraged to call back with questions or concerns? Yes.

## 2019-06-12 LAB — AEROBIC/ANAEROBIC CULTURE W GRAM STAIN (SURGICAL/DEEP WOUND): Gram Stain: NONE SEEN

## 2019-06-17 ENCOUNTER — Other Ambulatory Visit: Payer: Self-pay

## 2019-06-17 ENCOUNTER — Ambulatory Visit (INDEPENDENT_AMBULATORY_CARE_PROVIDER_SITE_OTHER): Payer: Medicare Other | Admitting: Internal Medicine

## 2019-06-17 DIAGNOSIS — E1159 Type 2 diabetes mellitus with other circulatory complications: Secondary | ICD-10-CM | POA: Diagnosis not present

## 2019-06-17 DIAGNOSIS — M069 Rheumatoid arthritis, unspecified: Secondary | ICD-10-CM

## 2019-06-17 DIAGNOSIS — E7800 Pure hypercholesterolemia, unspecified: Secondary | ICD-10-CM

## 2019-06-17 DIAGNOSIS — E1152 Type 2 diabetes mellitus with diabetic peripheral angiopathy with gangrene: Secondary | ICD-10-CM

## 2019-06-17 DIAGNOSIS — I739 Peripheral vascular disease, unspecified: Secondary | ICD-10-CM

## 2019-06-17 DIAGNOSIS — I10 Essential (primary) hypertension: Secondary | ICD-10-CM | POA: Diagnosis not present

## 2019-06-17 DIAGNOSIS — D696 Thrombocytopenia, unspecified: Secondary | ICD-10-CM

## 2019-06-17 NOTE — Progress Notes (Signed)
Patient ID: Nina Perez, female   DOB: [birth year], 79 y.o.   MRN: [id number]   Virtual Visit via video Note  This visit type was conducted due to national recommendations for restrictions regarding the COVID-19 pandemic (e.g. social distancing).  This format is felt to be most appropriate for this patient at this time.  All issues noted in this document were discussed and addressed.  No physical exam was performed (except for noted visual exam findings with Video Visits).   I connected with Edgardo Roys by a video enabled telemedicine application and verified that I am speaking with the correct person using two identifiers. Location patient: home Location provider: work  Persons participating in the virtual visit: patient, provider  The limitations, risks, security and privacy concerns of performing an evaluation and management service by video and the availability of in person appointments have been discussed. The patient expressed understanding and agreed to proceed.   Reason for visit: hospital follow up.   HPI: Was admitted 06/01/19 - 06/09/19.  Admitted to have toe immunity.  Had angioplasty/angiogram last week in [place]. S/p left SFA mid angioplasty.  Persistent left toe pain.  Found to have gangrene of left 4thMTP and is now s/p amputation.  Wound cultures grew pseudomonas and serratia.  Received IV cefepime and was transitioned to cipro.  D/c'd home with home health.  Recommended continued aspirin, plavix and statin medication.  Pain is better.  Feels better.  Still needs help walking.  Family supportive.  No chest pain or sob reported.  Eating.  No vomiting.  Due to f/u with podiatry Monday.     ROS: See pertinent positives and negatives per HPI.  Past Medical History:  Diagnosis Date  . Barrett's esophagus   . Cancer ([place])    skin ca  . Diabetes mellitus ([place])   . Gout   . History of colon cancer    adenomatous polyps  . Hypercholesterolemia   . Hypertension   . Inflammatory  arthritis   . Osteoarthritis    knees, spine  . Phlebitis    x2 (with pregnancy)  . 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 Pulmonary hypertension ([place])   . Reflux esophagitis   . Rheumatoid arthritis(714.0)    positive RF, FANA, RNP, negative CCP ab and anti DNA,.  neg anti-SCL 70  . 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 ABDOMINAL AORTOGRAM W/LOWER EXTREMITY Left 05/16/2019   Procedure: ABDOMINAL AORTOGRAM W/LOWER EXTREMITY;  Surgeon: Lorretta Harp, MD;  Location: [place] [facility];  Service: Cardiovascular;  Laterality: Left;  . AMPUTATION TOE Left 06/02/2019   Procedure: AMPUTATION TOE;  Surgeon: Albertine Patricia, DPM;  Location: [facility];  Service: Podiatry;  Laterality: Left;  . BREAST CYST ASPIRATION Left    neg  . INCISION AND DRAINAGE OF WOUND Left 06/07/2019   Procedure: IRRIGATION AND DEBRIDEMENT WOUND;  Surgeon: Albertine Patricia, DPM;  Location: [facility];  Service: Podiatry;  Laterality: Left;  . LOWER EXTREMITY ANGIOGRAPHY Left 06/03/2019   Procedure: Lower Extremity Angiography;  Surgeon: Katha Cabal, MD;  Location: [place] [facility];  Service: Cardiovascular;  Laterality: Left;  . PERIPHERAL VASCULAR BALLOON ANGIOPLASTY Left 05/16/2019   Procedure: PERIPHERAL VASCULAR BALLOON ANGIOPLASTY;  Surgeon: Lorretta Harp, MD;  Location: [place] [facility];  Service: Cardiovascular;  Laterality: Left;  SFA  . VEIN LIGATION AND STRIPPING      Family History  Problem Relation Age of Onset  . Arthritis Mother   . Heart attack Father   . Colon cancer Neg Hx   . Breast cancer Neg Hx     SOCIAL HX: reviewed.    Current Outpatient Medications:  .  acetaminophen (TYLENOL) 650 MG CR tablet, Take 650 mg by mouth every 4 (four) hours as needed for pain. , Disp: , Rfl:  .  allopurinol (ZYLOPRIM) 300 MG tablet, Take 1 tablet by mouth once daily (Patient taking differently: Take 300 mg by mouth  daily. ), Disp: 90 tablet, Rfl: 0 .  amLODipine (NORVASC) 10 MG tablet, TAKE 1 TABLET BY MOUTH ONCE DAILY (Patient taking differently: Take 10 mg by mouth daily. ), Disp: 90 tablet, Rfl: 3 .  aspirin 81 MG tablet, Take 81 mg by mouth daily. Reported on 07/24/2015, Disp: , Rfl:  .  atorvastatin (LIPITOR) 80 MG tablet, Take 1 tablet (80 mg total) by mouth daily at 6 PM., Disp: 90 tablet, Rfl: 1 .  clopidogrel (PLAVIX) 75 MG tablet, Take 1 tablet (75 mg total) by mouth daily with breakfast., Disp: 90 tablet, Rfl: 2 .  fluticasone (FLONASE) 50 MCG/ACT nasal spray, Place 2 sprays into the nose daily. (Patient not taking: Reported on 06/01/2019), Disp: 16 g, Rfl: 1 .  folic acid (FOLVITE) 1 MG tablet, Take 1 mg by mouth every evening. , Disp: , Rfl:  .  gabapentin (NEURONTIN) 100 MG capsule, Take 1 capsule (100 mg total) by mouth 3 (three) times daily., Disp: 90 capsule, Rfl: 1 .  glucose blood (BAYER CONTOUR TEST) test strip, USE  STRIP TO CHECK GLUCOSE TWICE DAILY. Dx: E11.9, Disp: 100 each, Rfl: 12 .  losartan (COZAAR) 100 MG tablet, Take 1 tablet by mouth once daily (Patient taking differently: Take 100 mg by mouth daily. ), Disp: 90 tablet, Rfl: 1 .  metFORMIN (GLUCOPHAGE) 500 MG tablet, Take 1 tablet by mouth once daily, Disp: 90 tablet, Rfl: 0 .  methotrexate 2.5 MG tablet, Take 10 mg by mouth every Wednesday. , Disp: , Rfl:  .  metoprolol succinate (TOPROL-XL) 50 MG 24 hr tablet, TAKE 1 TABLET BY MOUTH ONCE DAILY IMMEDIATELY  FOLLOWING  A  MEAL (Patient taking differently: Take 50 mg by mouth daily. ), Disp: 90 tablet, Rfl: 0 .  MICROLET LANCETS MISC, Check sugar once daily, [employer] Microlet Lancets. Dx E11.9, Disp: 100 each, Rfl: 12 .  mupirocin ointment (BACTROBAN) 2 %, Place 1 application into the nose 2 (two) times daily., Disp: 22 g, Rfl: 0 .  pantoprazole (PROTONIX) 40 MG tablet, Take 1 tablet (40 mg total) by mouth daily., Disp: 30 tablet, Rfl: 1 .  polyethylene glycol (MIRALAX / GLYCOLAX) 17  g packet, Take 17 g by mouth daily., Disp: 14 each, Rfl: 0 .  senna-docusate (SENOKOT-S) 8.6-50 MG tablet, Take 1 tablet by mouth 2 (two) times daily., Disp: 14 tablet, Rfl: 0 .  spironolactone (ALDACTONE) 25 MG tablet, Take 1 tablet by mouth once daily (Patient taking differently: Take 25 mg by mouth daily. ), Disp: 90 tablet, Rfl: 1 .  traMADol (ULTRAM) 50 MG tablet, Take 1 tablet (50 mg total) by mouth 2 (two) times daily as needed (pain.)., Disp: 60 tablet, Rfl: 0  EXAM:  GENERAL: alert, oriented, appears well and in no acute distress  HEENT: atraumatic, conjunttiva clear, no obvious abnormalities on inspection of external nose and ears  NECK: normal movements of the head and neck  LUNGS: on inspection no signs of respiratory distress,  breathing rate appears normal, no obvious gross SOB, gasping or wheezing  CV: no obvious cyanosis  PSYCH/NEURO: pleasant and cooperative, no obvious depression or anxiety, speech and thought processing grossly intact  ASSESSMENT AND PLAN:  Discussed the following assessment and plan:  Diabetes mellitus (HCC) Low carb diet and exercise.  Follow met b and a1c.   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  Feels better.  Continue f/u with vascular surgery and podiatry.    Hypercholesterolemia On lipior.  Follow lipid panel and liver function tests.    Hypertension Blood pressure under good control.  Continue same medication regimen.  Follow pressures.  Follow metabolic panel.    PAD (peripheral artery disease) (HCC) S/p angioplasty/angiogram and now s/p toe amputation.  Continue statin and plavix.  Follow.    Rheumatoid arthritis ([place]) Followed by Dr Jefm Bryant.  On MTX.   Thrombocytopenia (HCC) Last platelet count wnl.      I discussed the assessment and treatment plan with the patient. The patient was provided an opportunity to ask questions and all were answered. The patient agreed with the plan and demonstrated an  understanding of the instructions.   The patient was advised to call back or seek an in-person evaluation if the symptoms worsen or if the condition fails to improve as anticipated.   Einar Pheasant, MD

## 2019-06-19 ENCOUNTER — Other Ambulatory Visit: Payer: Self-pay | Admitting: Internal Medicine

## 2019-06-23 NOTE — Anesthesia Postprocedure Evaluation (Signed)
Anesthesia Post Note  Patient: Nina Perez  Procedure(s) Performed: AMPUTATION TOE (Left Toe)  Patient location during evaluation: PACU Anesthesia Type: General Level of consciousness: awake and alert Pain management: pain level controlled Vital Signs Assessment: post-procedure vital signs reviewed and stable Respiratory status: spontaneous breathing, nonlabored ventilation and respiratory function stable Cardiovascular status: blood pressure returned to baseline and stable Postop Assessment: no apparent nausea or vomiting Anesthetic complications: no     Last Vitals:  Vitals:   06/09/19 0820 06/09/19 1841  BP: (!) 150/107 (!) 118/58  Pulse: 62 (!) 56  Resp: 16 16  Temp: 36.9 C 36.8 C  SpO2: 95% 94%    Last Pain:  Vitals:   06/09/19 1723  TempSrc:   PainSc: 9                  Alexei Ey T Lavone Neri

## 2019-06-25 ENCOUNTER — Encounter: Payer: Self-pay | Admitting: Internal Medicine

## 2019-06-25 NOTE — Assessment & Plan Note (Signed)
Followed by Dr Kernodle.  On MTX.   

## 2019-06-25 NOTE — Assessment & Plan Note (Signed)
S/p angioplasty/angiogram and now s/p toe amputation.  Continue statin and plavix.  Follow.

## 2019-06-25 NOTE — Assessment & Plan Note (Signed)
On lipior.  Follow lipid panel and liver function tests.

## 2019-06-25 NOTE — Assessment & Plan Note (Signed)
Blood pressure under good control.  Continue same medication regimen.  Follow pressures.  Follow metabolic panel.   

## 2019-06-25 NOTE — Assessment & Plan Note (Signed)
Low carb diet and exercise.  Follow met b and a1c.  

## 2019-06-25 NOTE — Assessment & Plan Note (Signed)
Last platelet count wnl.   

## 2019-06-25 NOTE — Assessment & Plan Note (Signed)
S/p toe amputation.  Feels better.  Continue f/u with vascular surgery and podiatry.

## 2019-06-27 ENCOUNTER — Other Ambulatory Visit: Payer: Medicare Other

## 2019-06-29 ENCOUNTER — Ambulatory Visit: Payer: Medicare Other | Admitting: Internal Medicine

## 2019-07-04 ENCOUNTER — Telehealth: Payer: Self-pay | Admitting: Internal Medicine

## 2019-07-04 NOTE — Telephone Encounter (Signed)
Patient just wanted to confirm with PCP that it is ok for her to have the covid vaccine after having surgery on her foot today. Gave recommendations that she should not get the vaccine within 2 weeks of another vaccine or if she is allergic to other vaccines/ingredients in the COVID vaccine. Advised that Dr Nicki Reaper is out of the office until Wednesday. Pt stated that was fine and she was ok to wait for response.

## 2019-07-04 NOTE — Telephone Encounter (Signed)
Pt wants to know if she should have the covid vaccine. Pt states she was in the hospital on 06/16/19 for her foot and people told her she should ask Dr. Nicki Reaper when she should have the vaccine. Please advise.

## 2019-07-05 NOTE — Telephone Encounter (Signed)
Patient aware and is going to call Dr Elvina Mattes and make sure he is ok with her getting the vaccine as well.

## 2019-07-05 NOTE — Telephone Encounter (Signed)
I am not aware of any contraindication related to her surgery that would keep her from getting the vaccine.  I would recommend she confirm with her surgeon as well.

## 2019-07-06 ENCOUNTER — Ambulatory Visit (INDEPENDENT_AMBULATORY_CARE_PROVIDER_SITE_OTHER): Payer: Medicare Other | Admitting: Nurse Practitioner

## 2019-07-06 ENCOUNTER — Encounter (INDEPENDENT_AMBULATORY_CARE_PROVIDER_SITE_OTHER): Payer: Medicare Other

## 2019-07-12 ENCOUNTER — Ambulatory Visit: Payer: Medicare Other | Admitting: Cardiovascular Disease

## 2019-07-28 ENCOUNTER — Other Ambulatory Visit: Payer: Self-pay | Admitting: Internal Medicine

## 2019-07-28 ENCOUNTER — Other Ambulatory Visit: Payer: Self-pay | Admitting: Cardiology

## 2019-08-26 ENCOUNTER — Other Ambulatory Visit: Payer: Self-pay | Admitting: Internal Medicine

## 2019-08-29 ENCOUNTER — Other Ambulatory Visit: Payer: Self-pay | Admitting: Internal Medicine

## 2019-09-01 ENCOUNTER — Other Ambulatory Visit: Payer: Self-pay | Admitting: Internal Medicine

## 2019-09-08 ENCOUNTER — Telehealth: Payer: Self-pay | Admitting: Internal Medicine

## 2019-09-08 NOTE — Telephone Encounter (Signed)
patient wanted to wait until April to be seen. " [facility] said 3 months ago  "left message for patient to call Cardiology for appt. " [facility] said 3 months ago  "Rejection Reason - Patient Declined" [facility] said about 19 hours ago  cardiology

## 2019-09-22 ENCOUNTER — Other Ambulatory Visit: Payer: Self-pay | Admitting: Internal Medicine

## 2019-10-06 ENCOUNTER — Other Ambulatory Visit: Payer: Self-pay | Admitting: Internal Medicine

## 2019-10-19 ENCOUNTER — Other Ambulatory Visit: Payer: Self-pay | Admitting: Internal Medicine

## 2019-10-19 ENCOUNTER — Other Ambulatory Visit: Payer: Self-pay

## 2019-10-19 ENCOUNTER — Ambulatory Visit (INDEPENDENT_AMBULATORY_CARE_PROVIDER_SITE_OTHER): Payer: Medicare Other | Admitting: Internal Medicine

## 2019-10-19 VITALS — BP 112/64 | HR 71 | Temp 96.5°F | Resp 16 | Ht 66.0 in | Wt 220.0 lb

## 2019-10-19 DIAGNOSIS — I739 Peripheral vascular disease, unspecified: Secondary | ICD-10-CM

## 2019-10-19 DIAGNOSIS — E1159 Type 2 diabetes mellitus with other circulatory complications: Secondary | ICD-10-CM

## 2019-10-19 DIAGNOSIS — I10 Essential (primary) hypertension: Secondary | ICD-10-CM

## 2019-10-19 DIAGNOSIS — K2100 Gastro-esophageal reflux disease with esophagitis, without bleeding: Secondary | ICD-10-CM

## 2019-10-19 DIAGNOSIS — E7800 Pure hypercholesterolemia, unspecified: Secondary | ICD-10-CM | POA: Diagnosis not present

## 2019-10-19 DIAGNOSIS — M069 Rheumatoid arthritis, unspecified: Secondary | ICD-10-CM

## 2019-10-19 DIAGNOSIS — N1831 Chronic kidney disease, stage 3a: Secondary | ICD-10-CM

## 2019-10-19 DIAGNOSIS — D696 Thrombocytopenia, unspecified: Secondary | ICD-10-CM

## 2019-10-19 DIAGNOSIS — R944 Abnormal results of kidney function studies: Secondary | ICD-10-CM

## 2019-10-19 DIAGNOSIS — M7989 Other specified soft tissue disorders: Secondary | ICD-10-CM

## 2019-10-19 DIAGNOSIS — I890 Lymphedema, not elsewhere classified: Secondary | ICD-10-CM

## 2019-10-19 LAB — TSH: TSH: 4.82 u[IU]/mL — ABNORMAL HIGH (ref 0.35–4.50)

## 2019-10-19 LAB — BASIC METABOLIC PANEL
BUN: 41 mg/dL — ABNORMAL HIGH (ref 6–23)
CO2: 28 mEq/L (ref 19–32)
Calcium: 10.4 mg/dL (ref 8.4–10.5)
Chloride: 102 mEq/L (ref 96–112)
Creatinine, Ser: 1.29 mg/dL — ABNORMAL HIGH (ref 0.40–1.20)
GFR: 39.93 mL/min — ABNORMAL LOW (ref >60.00)
Glucose, Bld: 98 mg/dL (ref 70–99)
Potassium: 4.1 mEq/L (ref 3.5–5.1)
Sodium: 136 mEq/L (ref 135–145)

## 2019-10-19 LAB — HEPATIC FUNCTION PANEL
ALT: 18 U/L (ref 0–35)
AST: 21 U/L (ref 0–37)
Albumin: 4 g/dL (ref 3.5–5.2)
Alkaline Phosphatase: 78 U/L (ref 39–117)
Bilirubin, Direct: 0.2 mg/dL (ref 0.0–0.3)
Total Bilirubin: 0.6 mg/dL (ref 0.2–1.2)
Total Protein: 7.4 g/dL (ref 6.0–8.3)

## 2019-10-19 LAB — HEMOGLOBIN A1C: Hgb A1c MFr Bld: 6.3 % (ref 4.6–6.5)

## 2019-10-19 LAB — LIPID PANEL
Cholesterol: 89 mg/dL (ref 0–200)
HDL: 32.8 mg/dL — ABNORMAL LOW (ref >39.00)
LDL Cholesterol: 33 mg/dL (ref 0–99)
NonHDL: 56.46
Total CHOL/HDL Ratio: 3
Triglycerides: 116 mg/dL (ref 0.0–149.0)
VLDL: 23.2 mg/dL (ref 0.0–40.0)

## 2019-10-19 NOTE — Progress Notes (Signed)
Order placed for f/u met b 

## 2019-10-19 NOTE — Progress Notes (Signed)
Patient ID: Nina Perez, female   DOB: 05-26-1940, 79 y.o.   MRN: [id number]   Subjective:    Patient ID: Nina Perez, female    DOB: Dec 27, 1940, 79 y.o.   MRN: [id number]  HPI This visit occurred during the SARS-CoV-2 public health emergency.  Safety protocols were in place, including screening questions prior to the visit, additional usage of staff PPE, and extensive cleaning of exam room while observing appropriate contact time as indicated for disinfecting solutions.  Patient here for a scheduled follow up. She is accompanied by her husband.  History obtained from both of them.  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  Has healed.  Pain is much better.  She is having some problems with her knees. Hurts to stand. Is walking with a cane.  No chest pain reported.  Sees Dr Lanney Gins.  Breathing is stable.  Last evaluated 09/22/19.  DOE felt due to pulmonary hypertension, crest syndrome, atelectasis and deconditioning.  Recommended incentive spirometer and exercise.  Recommended long term oxygen therapy.  Was started on torsemide secondary to lower extremity edema.  Felt the torsemide caused decreased energy and cramping.  She changed to qod dosing.  Eating.  No nausea or vomiting reported.    No abdominal pain.  Blood sugars doing well per her report.  States this am 111.     Past Medical History:  Diagnosis Date  . Barrett's esophagus   . Cancer ([place])    skin ca  . Diabetes mellitus ([place])   . Gout   . History of colon cancer    adenomatous polyps  . Hypercholesterolemia   . Hypertension   . Inflammatory arthritis   . Osteoarthritis    knees, spine  . Phlebitis    x2 (with pregnancy)  . 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 Pulmonary hypertension ([place])   . Reflux esophagitis   . Rheumatoid arthritis(714.0)    positive RF, FANA, RNP, negative CCP ab and anti DNA,.  neg anti-SCL 70  . Scleroderma ([place])    raynaud's,  sclerodactyly, telangiectasias   Past Surgical History:  Procedure Laterality Date  . ABDOMINAL AORTOGRAM W/LOWER EXTREMITY Left 05/16/2019   Procedure: ABDOMINAL AORTOGRAM W/LOWER EXTREMITY;  Surgeon: Lorretta Harp, MD;  Location: [place] [facility];  Service: Cardiovascular;  Laterality: Left;  . AMPUTATION TOE Left 06/02/2019   Procedure: AMPUTATION TOE;  Surgeon: Albertine Patricia, DPM;  Location: [facility];  Service: Podiatry;  Laterality: Left;  . BREAST CYST ASPIRATION Left    neg  . INCISION AND DRAINAGE OF WOUND Left 06/07/2019   Procedure: IRRIGATION AND DEBRIDEMENT WOUND;  Surgeon: Albertine Patricia, DPM;  Location: [facility];  Service: Podiatry;  Laterality: Left;  . LOWER EXTREMITY ANGIOGRAPHY Left 06/03/2019   Procedure: Lower Extremity Angiography;  Surgeon: Katha Cabal, MD;  Location: [place] [facility];  Service: Cardiovascular;  Laterality: Left;  . PERIPHERAL VASCULAR BALLOON ANGIOPLASTY Left 05/16/2019   Procedure: PERIPHERAL VASCULAR BALLOON ANGIOPLASTY;  Surgeon: Lorretta Harp, MD;  Location: [place] [facility];  Service: Cardiovascular;  Laterality: Left;  SFA  . VEIN LIGATION AND STRIPPING     Family History  Problem Relation Age of Onset  . Arthritis Mother   . Heart attack Father   . Colon cancer Neg Hx   . Breast cancer Neg Hx    Social History   Socioeconomic History  .  Marital status: Married    Spouse name: Not on file  . Number of children: Not on file  . Years of education: Not on file  . Highest education level: Not on file  Occupational History  . Not on file  Tobacco Use  . Smoking status: Former Smoker    Packs/day: 0.50    Years: 15.00    Pack years: 7.50    Quit date: 05/12/1988    Years since quitting: 31.4  . Smokeless tobacco: Never Used  Vaping Use  . Vaping Use: Never used  Substance and Sexual Activity  . Alcohol use: No    Alcohol/week: 0.0 standard drinks  . Drug use: No  . Sexual activity: Yes  Other Topics Concern    . Not on file  Social History Narrative  . Not on file   Social Determinants of Health   Financial Resource Strain:   . Difficulty of Paying Living Expenses:   Food Insecurity:   . Worried About Charity fundraiser in the Last Year:   . Arboriculturist in the Last Year:   Transportation Needs:   . Film/video editor (Medical):   Marland Kitchen Lack of Transportation (Non-Medical):   Physical Activity:   . Days of Exercise per Week:   . Minutes of Exercise per Session:   Stress: No Stress Concern Present  . Feeling of Stress : Only a little  Social Connections:   . Frequency of Communication with Friends and Family:   . Frequency of Social Gatherings with Friends and Family:   . Attends Religious Services:   . Active Member of Clubs or Organizations:   . Attends Archivist Meetings:   Marland Kitchen Marital Status:     Outpatient Encounter Medications as of 10/19/2019  Medication Sig  . torsemide (DEMADEX) 20 MG tablet Take by mouth.  Marland Kitchen acetaminophen (TYLENOL) 650 MG CR tablet Take 650 mg by mouth every 4 (four) hours as needed for pain.   Marland Kitchen amLODipine (NORVASC) 10 MG tablet TAKE 1 TABLET BY MOUTH DAILY  . aspirin 81 MG tablet Take 81 mg by mouth daily. Reported on 07/24/2015  . atorvastatin (LIPITOR) 80 MG tablet Take 1 tablet (80 mg total) by mouth daily at 6 PM.  . clopidogrel (PLAVIX) 75 MG tablet Take 1 tablet (75 mg total) by mouth daily with breakfast.  . folic acid (FOLVITE) 1 MG tablet Take 1 mg by mouth every evening.   . gabapentin (NEURONTIN) 100 MG capsule Take 1 capsule (100 mg total) by mouth 3 (three) times daily.  Marland Kitchen glucose blood (BAYER CONTOUR TEST) test strip USE  STRIP TO CHECK GLUCOSE TWICE DAILY. Dx: E11.9  . losartan (COZAAR) 100 MG tablet Take 1 tablet by mouth once daily  . metFORMIN (GLUCOPHAGE) 500 MG tablet Take 1 tablet by mouth once daily  . metoprolol succinate (TOPROL-XL) 50 MG 24 hr tablet TAKE 1 TABLET BY MOUTH ONCE DAILY IMMEDIATELY FOLLOWING A MEAL  .  MICROLET LANCETS MISC Check sugar once daily, [employer] Microlet Lancets. Dx E11.9  . pantoprazole (PROTONIX) 40 MG tablet Take 1 tablet (40 mg total) by mouth daily.  . [DISCONTINUED] allopurinol (ZYLOPRIM) 300 MG tablet Take 1 tablet by mouth once daily  . [DISCONTINUED] fluticasone (FLONASE) 50 MCG/ACT nasal spray Place 2 sprays into the nose daily. (Patient not taking: Reported on 06/01/2019)  . [DISCONTINUED] methotrexate 2.5 MG tablet Take 10 mg by mouth every Wednesday.   . [DISCONTINUED] mupirocin ointment (BACTROBAN) 2 %  Place 1 application into the nose 2 (two) times daily.  . [DISCONTINUED] polyethylene glycol (MIRALAX / GLYCOLAX) 17 g packet Take 17 g by mouth daily.  . [DISCONTINUED] senna-docusate (SENOKOT-S) 8.6-50 MG tablet Take 1 tablet by mouth 2 (two) times daily.  . [DISCONTINUED] spironolactone (ALDACTONE) 25 MG tablet Take 1 tablet by mouth once daily  . [DISCONTINUED] traMADol (ULTRAM) 50 MG tablet Take 1 tablet (50 mg total) by mouth 2 (two) times daily as needed (pain.).   No facility-administered encounter medications on file as of 10/19/2019.    Review of Systems  Constitutional: Negative for appetite change and unexpected weight change.  HENT: Negative for congestion and sinus pressure.   Respiratory: Negative for cough and chest tightness.        Breathing stable.    Cardiovascular: Negative for chest pain and palpitations.  Gastrointestinal: Negative for abdominal pain, diarrhea, nausea and vomiting.  Genitourinary: Negative for difficulty urinating and dysuria.  Musculoskeletal: Negative for joint swelling and myalgias.  Skin: Negative for color change and rash.       Ulcer healed left foot and amputation site.    Neurological: Negative for dizziness, light-headedness and headaches.  Psychiatric/Behavioral: Negative for agitation and dysphoric mood.       Objective:    Physical Exam Vitals reviewed.  Constitutional:      General: She is not in acute  distress.    Appearance: Normal appearance.  HENT:     Head: Normocephalic and atraumatic.     Right Ear: External ear normal.     Left Ear: External ear normal.  Eyes:     General: No scleral icterus.       Right eye: No discharge.        Left eye: No discharge.     Conjunctiva/sclera: Conjunctivae normal.  Neck:     Thyroid: No thyromegaly.  Cardiovascular:     Rate and Rhythm: Normal rate and regular rhythm.  Pulmonary:     Effort: No respiratory distress.     Breath sounds: Normal breath sounds. No wheezing.  Abdominal:     General: Bowel sounds are normal.     Palpations: Abdomen is soft.     Tenderness: There is no abdominal tenderness.  Musculoskeletal:        General: No swelling or tenderness.     Cervical back: Neck supple. No tenderness.  Lymphadenopathy:     Cervical: No cervical adenopathy.  Skin:    Findings: No erythema or rash.     Comments: Healed - amputation site.  No increased erythema.    Neurological:     Mental Status: She is alert.  Psychiatric:        Mood and Affect: Mood normal.        Behavior: Behavior normal.     BP 112/64   Pulse 71   Temp (!) 96.5 F (35.8 C)   Resp 16   Ht '5\' 6"'  (1.676 m)   Wt 220 lb (99.8 kg)   LMP 05/25/1988   SpO2 98%   BMI 35.51 kg/m  Wt Readings from Last 3 Encounters:  10/19/19 220 lb (99.8 kg)  06/04/19 223 lb 1.7 oz (101.2 kg)  05/23/19 235 lb (106.6 kg)     Lab Results  Component Value Date   WBC 8.1 06/09/2019   HGB 10.9 (L) 06/09/2019   HCT 34.8 (L) 06/09/2019   PLT 206 06/09/2019   GLUCOSE 98 10/19/2019   CHOL 89 10/19/2019   TRIG 116.0  10/19/2019   HDL 32.80 (L) 10/19/2019   LDLCALC 33 10/19/2019   ALT 18 10/19/2019   AST 21 10/19/2019   NA 136 10/19/2019   K 4.1 10/19/2019   CL 102 10/19/2019   CREATININE 1.29 (H) 10/19/2019   BUN 41 (H) 10/19/2019   CO2 28 10/19/2019   TSH 4.82 (H) 10/19/2019   INR 1.1 06/02/2019   HGBA1C 6.3 10/19/2019   MICROALBUR 7.6 (H) 02/10/2019        Assessment & Plan:   Problem List Items Addressed This Visit    CKD (chronic kidney disease) stage 3, GFR 30-59 ml/min    Avoid antiinflammatories.  Follow met b.       Diabetes mellitus ([place])    Low carb diet and exercise.  Follow met b and a1c.       Relevant Orders   Hemoglobin A1c (Completed)   GERD (gastroesophageal reflux disease)    No upper symptoms reported.  On protonix.        Hypercholesterolemia    On lipitor.  Low cholesterol diet and exercise.  Follow lipid panel and liver function tests.        Relevant Medications   torsemide (DEMADEX) 20 MG tablet   Other Relevant Orders   Hepatic function panel (Completed)   Lipid panel (Completed)   Hypertension    Blood pressure doing well as outlined.  Continue amlodipine, metoprolol, losartan adnd spironolactone.        Relevant Medications   torsemide (DEMADEX) 20 MG tablet   Other Relevant Orders   TSH (Completed)   Basic metabolic panel (Completed)   Lymphedema    Followed by vascular surgery.  Compression hose.        PAD (peripheral artery disease) (HCC)    S/p angioplasty/angiogram and s/p toe amputation.  Continue statin and plavix.  Amputation site healed.  Follow.        Relevant Medications   torsemide (DEMADEX) 20 MG tablet   Rheumatoid arthritis ([place]) - Primary    Followed by Dr Jefm Bryant.  Stable.        Swelling of lower extremity    Was placed on torsemide.  Developed cramps and fatigue.  Changed to qod dosing.  Check metabolic panel.  Follow.        Thrombocytopenia (HCC)    Last cbc revealed normal platelet count.  Follow.            Einar Pheasant, MD

## 2019-10-20 ENCOUNTER — Other Ambulatory Visit: Payer: Self-pay | Admitting: Internal Medicine

## 2019-10-20 ENCOUNTER — Telehealth: Payer: Self-pay

## 2019-10-20 DIAGNOSIS — I10 Essential (primary) hypertension: Secondary | ICD-10-CM

## 2019-10-20 NOTE — Telephone Encounter (Signed)
Placed TSH labs for 6 week recheck

## 2019-10-26 ENCOUNTER — Encounter: Payer: Self-pay | Admitting: Internal Medicine

## 2019-10-26 DIAGNOSIS — M7989 Other specified soft tissue disorders: Secondary | ICD-10-CM | POA: Insufficient documentation

## 2019-10-26 NOTE — Assessment & Plan Note (Signed)
No upper symptoms reported.  On protonix.   

## 2019-10-26 NOTE — Assessment & Plan Note (Signed)
On lipitor.  Low cholesterol diet and exercise.  Follow lipid panel and liver function tests.   

## 2019-10-26 NOTE — Assessment & Plan Note (Signed)
Avoid antiinflammatories.  Follow met b.   

## 2019-10-26 NOTE — Assessment & Plan Note (Signed)
Blood pressure doing well as outlined.  Continue amlodipine, metoprolol, losartan adnd spironolactone.

## 2019-10-26 NOTE — Assessment & Plan Note (Signed)
Last cbc revealed normal platelet count.  Follow.

## 2019-10-26 NOTE — Assessment & Plan Note (Signed)
Followed by Dr Kernodle.  Stable.  

## 2019-10-26 NOTE — Assessment & Plan Note (Signed)
Low carb diet and exercise.  Follow met b and a1c.  

## 2019-10-26 NOTE — Assessment & Plan Note (Signed)
Followed by vascular surgery.  Compression hose.

## 2019-10-26 NOTE — Assessment & Plan Note (Signed)
S/p angioplasty/angiogram and s/p toe amputation.  Continue statin and plavix.  Amputation site healed.  Follow.

## 2019-10-26 NOTE — Assessment & Plan Note (Signed)
Was placed on torsemide.  Developed cramps and fatigue.  Changed to qod dosing.  Check metabolic panel.  Follow.

## 2019-10-28 ENCOUNTER — Other Ambulatory Visit: Payer: Self-pay

## 2019-10-28 ENCOUNTER — Other Ambulatory Visit (INDEPENDENT_AMBULATORY_CARE_PROVIDER_SITE_OTHER): Payer: Medicare Other

## 2019-10-28 DIAGNOSIS — R944 Abnormal results of kidney function studies: Secondary | ICD-10-CM

## 2019-10-28 DIAGNOSIS — I10 Essential (primary) hypertension: Secondary | ICD-10-CM

## 2019-10-28 LAB — BASIC METABOLIC PANEL
BUN: 29 mg/dL — ABNORMAL HIGH (ref 6–23)
CO2: 26 mEq/L (ref 19–32)
Calcium: 9.8 mg/dL (ref 8.4–10.5)
Chloride: 105 mEq/L (ref 96–112)
Creatinine, Ser: 1.35 mg/dL — ABNORMAL HIGH (ref 0.40–1.20)
GFR: 37.88 mL/min — ABNORMAL LOW (ref >60.00)
Glucose, Bld: 119 mg/dL — ABNORMAL HIGH (ref 70–99)
Potassium: 4.4 mEq/L (ref 3.5–5.1)
Sodium: 138 mEq/L (ref 135–145)

## 2019-10-28 LAB — TSH: TSH: 6.4 u[IU]/mL — ABNORMAL HIGH (ref 0.35–4.50)

## 2019-10-31 ENCOUNTER — Other Ambulatory Visit: Payer: Self-pay | Admitting: Internal Medicine

## 2019-10-31 DIAGNOSIS — N1832 Chronic kidney disease, stage 3b: Secondary | ICD-10-CM

## 2019-10-31 NOTE — Progress Notes (Signed)
Orders placed for f/u labs.  

## 2019-11-01 ENCOUNTER — Other Ambulatory Visit: Payer: Self-pay | Admitting: Internal Medicine

## 2019-11-01 DIAGNOSIS — R944 Abnormal results of kidney function studies: Secondary | ICD-10-CM

## 2019-11-01 NOTE — Progress Notes (Signed)
Order placed for renal ultrasound.  

## 2019-11-03 ENCOUNTER — Ambulatory Visit
Admission: RE | Admit: 2019-11-03 | Discharge: 2019-11-03 | Disposition: A | Discharge status: Home / Self Care | Payer: Medicare Other | Source: Ambulatory Visit | Attending: Internal Medicine | Admitting: Internal Medicine

## 2019-11-03 ENCOUNTER — Other Ambulatory Visit: Payer: Self-pay

## 2019-11-03 DIAGNOSIS — D1771 Benign lipomatous neoplasm of kidney: Secondary | ICD-10-CM

## 2019-11-03 DIAGNOSIS — R944 Abnormal results of kidney function studies: Secondary | ICD-10-CM

## 2019-11-03 HISTORY — DX: Benign lipomatous neoplasm of kidney: D17.71

## 2019-11-08 ENCOUNTER — Other Ambulatory Visit: Payer: Self-pay | Admitting: Internal Medicine

## 2019-11-08 DIAGNOSIS — R93429 Abnormal radiologic findings on diagnostic imaging of unspecified kidney: Secondary | ICD-10-CM

## 2019-11-08 NOTE — Progress Notes (Signed)
Order placed for urology referral.  

## 2019-11-10 ENCOUNTER — Ambulatory Visit: Payer: Medicare Other

## 2019-11-15 ENCOUNTER — Other Ambulatory Visit (INDEPENDENT_AMBULATORY_CARE_PROVIDER_SITE_OTHER): Payer: Self-pay | Admitting: Vascular Surgery

## 2019-11-15 DIAGNOSIS — I70249 Atherosclerosis of native arteries of left leg with ulceration of unspecified site: Secondary | ICD-10-CM

## 2019-11-15 DIAGNOSIS — Z9862 Peripheral vascular angioplasty status: Secondary | ICD-10-CM

## 2019-11-17 ENCOUNTER — Ambulatory Visit (INDEPENDENT_AMBULATORY_CARE_PROVIDER_SITE_OTHER): Payer: Medicare Other

## 2019-11-17 VITALS — Ht 66.0 in | Wt 220.0 lb

## 2019-11-17 DIAGNOSIS — Z0000 Encounter for general adult medical examination without abnormal findings: Secondary | ICD-10-CM

## 2019-11-17 NOTE — Patient Instructions (Addendum)
Nina Perez , Thank you for taking time to come for your Medicare Wellness Visit. I appreciate your ongoing commitment to your health goals. Please review the following plan we discussed and let me know if I can assist you in the future.   These are the goals we discussed: Goals      Patient Stated   .  Low carb diet (pt-stated)       This is a list of the screening recommended for you and due dates:  Health Maintenance  Topic Date Due  . Eye exam for diabetics  11/03/2017  . Mammogram  10/18/2020*  . DEXA scan (bone density measurement)  11/16/2020*  . Tetanus Vaccine  11/16/2020*  .  Hepatitis C: One time screening is recommended by Center for Disease Control  (CDC) for  adults born from [birth year] through 1964.   11/16/2020*  . Flu Shot  12/11/2019  . Hemoglobin A1C  04/19/2020  . Complete foot exam   10/24/2020  . COVID-19 Vaccine  Completed  . Pneumonia vaccines  Completed  *Topic was postponed. The date shown is not the original due date.    Immunizations Immunization History  Administered Date(s) Administered  . Fluad Quad(high Dose 65+) 02/07/2019  . Influenza Split 02/09/2013  . Influenza Whole 02/18/2017  . Influenza, High Dose Seasonal PF 04/14/2016, 02/23/2018  . Influenza,inj,Quad PF,6+ Mos 02/28/2014, 02/01/2015  . Influenza-Unspecified 02/20/2012  . PFIZER SARS-COV-2 Vaccination 07/07/2019, 07/28/2019  . Pneumococcal Conjugate-13 09/06/2013  . Pneumococcal Polysaccharide-23 06/12/2017   Keep all routine maintenance appointments.   Next scheduled lab 12/13/19 @ 11:15  Follow up 02/29/20  Advanced directives: declined  Conditions/risks identified: none new  Follow up in one year for your annual wellness visit    Preventive Care 65 Years and Older, Female Preventive care refers to lifestyle choices and visits with your health care provider that can promote health and wellness. What does preventive care include?  A yearly physical exam. This is also called an  annual well check.  Dental exams once or twice a year.  Routine eye exams. Ask your health care provider how often you should have your eyes checked.  Personal lifestyle choices, including:  Daily care of your teeth and gums.  Regular physical activity.  Eating a healthy diet.  Avoiding tobacco and drug use.  Limiting alcohol use.  Practicing safe sex.  Taking low-dose aspirin every day.  Taking vitamin and mineral supplements as recommended by your health care provider. What happens during an annual well check? The services and screenings done by your health care provider during your annual well check will depend on your age, overall health, lifestyle risk factors, and family history of disease. Counseling  Your health care provider may ask you questions about your:  Alcohol use.  Tobacco use.  Drug use.  Emotional well-being.  Home and relationship well-being.  Sexual activity.  Eating habits.  History of falls.  Memory and ability to understand (cognition).  Work and work Statistician.  Reproductive health. Screening  You may have the following tests or measurements:  Height, weight, and BMI.  Blood pressure.  Lipid and cholesterol levels. These may be checked every 5 years, or more frequently if you are over 56 years old.  Skin check.  Lung cancer screening. You may have this screening every year starting at age 13 if you have a 30-pack-year history of smoking and currently smoke or have quit within the past 15 years.  Fecal occult blood test (FOBT) of  the stool. You may have this test every year starting at age 75.  Flexible sigmoidoscopy or colonoscopy. You may have a sigmoidoscopy every 5 years or a colonoscopy every 10 years starting at age 59.  Hepatitis C blood test.  Hepatitis B blood test.  Sexually transmitted disease (STD) testing.  Diabetes screening. This is done by checking your blood sugar (glucose) after you have not eaten for  a while (fasting). You may have this done every 1-3 years.  Bone density scan. This is done to screen for osteoporosis. You may have this done starting at age 89.  Mammogram. This may be done every 1-2 years. Talk to your health care provider about how often you should have regular mammograms. Talk with your health care provider about your test results, treatment options, and if necessary, the need for more tests. Vaccines  Your health care provider may recommend certain vaccines, such as:  Influenza vaccine. This is recommended every year.  Tetanus, diphtheria, and acellular pertussis (Tdap, Td) vaccine. You may need a Td booster every 10 years.  Zoster vaccine. You may need this after age 72.  Pneumococcal 13-valent conjugate (PCV13) vaccine. One dose is recommended after age 49.  Pneumococcal polysaccharide (PPSV23) vaccine. One dose is recommended after age 64. Talk to your health care provider about which screenings and vaccines you need and how often you need them. This information is not intended to replace advice given to you by your health care provider. Make sure you discuss any questions you have with your health care provider. Document Released: 05/25/2015 Document Revised: 01/16/2016 Document Reviewed: 02/27/2015 Elsevier Interactive Patient Education  2017 [employer] Prevention in the Home Falls can cause injuries. They can happen to people of all ages. There are many things you can do to make your home safe and to help prevent falls. What can I do on the outside of my home?  Regularly fix the edges of walkways and driveways and fix any cracks.  Remove anything that might make you trip as you walk through a door, such as a raised step or threshold.  Trim any bushes or trees on the path to your home.  Use bright outdoor lighting.  Clear any walking paths of anything that might make someone trip, such as rocks or tools.  Regularly check to see if handrails  are loose or broken. Make sure that both sides of any steps have handrails.  Any raised decks and porches should have guardrails on the edges.  Have any leaves, snow, or ice cleared regularly.  Use sand or salt on walking paths during winter.  Clean up any spills in your garage right away. This includes oil or grease spills. What can I do in the bathroom?  Use night lights.  Install grab bars by the toilet and in the tub and shower. Do not use towel bars as grab bars.  Use non-skid mats or decals in the tub or shower.  If you need to sit down in the shower, use a plastic, non-slip stool.  Keep the floor dry. Clean up any water that spills on the floor as soon as it happens.  Remove soap buildup in the tub or shower regularly.  Attach bath mats securely with double-sided non-slip rug tape.  Do not have throw rugs and other things on the floor that can make you trip. What can I do in the bedroom?  Use night lights.  Make sure that you have a light by your  bed that is easy to reach.  Do not use any sheets or blankets that are too big for your bed. They should not hang down onto the floor.  Have a firm chair that has side arms. You can use this for support while you get dressed.  Do not have throw rugs and other things on the floor that can make you trip. What can I do in the kitchen?  Clean up any spills right away.  Avoid walking on wet floors.  Keep items that you use a lot in easy-to-reach places.  If you need to reach something above you, use a strong step stool that has a grab bar.  Keep electrical cords out of the way.  Do not use floor polish or wax that makes floors slippery. If you must use wax, use non-skid floor wax.  Do not have throw rugs and other things on the floor that can make you trip. What can I do with my stairs?  Do not leave any items on the stairs.  Make sure that there are handrails on both sides of the stairs and use them. Fix handrails  that are broken or loose. Make sure that handrails are as long as the stairways.  Check any carpeting to make sure that it is firmly attached to the stairs. Fix any carpet that is loose or worn.  Avoid having throw rugs at the top or bottom of the stairs. If you do have throw rugs, attach them to the floor with carpet tape.  Make sure that you have a light switch at the top of the stairs and the bottom of the stairs. If you do not have them, ask someone to add them for you. What else can I do to help prevent falls?  Wear shoes that:  Do not have high heels.  Have rubber bottoms.  Are comfortable and fit you well.  Are closed at the toe. Do not wear sandals.  If you use a stepladder:  Make sure that it is fully opened. Do not climb a closed stepladder.  Make sure that both sides of the stepladder are locked into place.  Ask someone to hold it for you, if possible.  Clearly mark and make sure that you can see:  Any grab bars or handrails.  First and last steps.  Where the edge of each step is.  Use tools that help you move around (mobility aids) if they are needed. These include:  Canes.  Walkers.  Scooters.  Crutches.  Turn on the lights when you go into a dark area. Replace any light bulbs as soon as they burn out.  Set up your furniture so you have a clear path. Avoid moving your furniture around.  If any of your floors are uneven, fix them.  If there are any pets around you, be aware of where they are.  Review your medicines with your doctor. Some medicines can make you feel dizzy. This can increase your chance of falling. Ask your doctor what other things that you can do to help prevent falls. This information is not intended to replace advice given to you by your health care provider. Make sure you discuss any questions you have with your health care provider. Document Released: 02/22/2009 Document Revised: 10/04/2015 Document Reviewed: 06/02/2014 Elsevier  Interactive Patient Education  2017 [employer].

## 2019-11-17 NOTE — Progress Notes (Addendum)
Subjective:   Nina Perez is a 79 y.o. female who presents for Medicare Annual (Subsequent) preventive examination.  Review of Systems    No ROS.  Medicare Wellness Virtual Visit.  Cardiac Risk Factors include: advanced age (>4men, >49 women);diabetes mellitus;hypertension     Objective:    Today's Vitals   11/17/19 1232  Weight: 220 lb (99.8 kg)  Height: 5\' 6"  (1.676 m)   Body mass index is 35.51 kg/m.  Advanced Directives 11/17/2019 06/02/2019 06/01/2019 05/16/2019 05/16/2019 11/09/2018 11/06/2017  Does Patient Have a Medical Advance Directive? No No No No No No No  Would patient like information on creating a medical advance directive? No - Patient declined No - Patient declined No - Patient declined No - Patient declined No - Patient declined Yes (MAU/Ambulatory/Procedural Areas - Information given) Yes (MAU/Ambulatory/Procedural Areas - Information given)    Current Medications (verified) Outpatient Encounter Medications as of 11/17/2019  Medication Sig  . acetaminophen (TYLENOL) 650 MG CR tablet Take 650 mg by mouth every 4 (four) hours as needed for pain.   Marland Kitchen allopurinol (ZYLOPRIM) 300 MG tablet Take 1 tablet by mouth once daily  . amLODipine (NORVASC) 10 MG tablet TAKE 1 TABLET BY MOUTH DAILY  . aspirin 81 MG tablet Take 81 mg by mouth daily. Reported on 07/24/2015  . atorvastatin (LIPITOR) 80 MG tablet Take 1 tablet (80 mg total) by mouth daily at 6 PM.  . clopidogrel (PLAVIX) 75 MG tablet Take 1 tablet (75 mg total) by mouth daily with breakfast.  . folic acid (FOLVITE) 1 MG tablet Take 1 mg by mouth every evening.   . gabapentin (NEURONTIN) 100 MG capsule Take 1 capsule (100 mg total) by mouth 3 (three) times daily.  Marland Kitchen glucose blood (BAYER CONTOUR TEST) test strip USE  STRIP TO CHECK GLUCOSE TWICE DAILY. Dx: E11.9  . losartan (COZAAR) 100 MG tablet Take 1 tablet by mouth once daily  . metFORMIN (GLUCOPHAGE) 500 MG tablet Take 1 tablet by mouth once daily  . metoprolol  succinate (TOPROL-XL) 50 MG 24 hr tablet TAKE 1 TABLET BY MOUTH ONCE DAILY IMMEDIATELY FOLLOWING A MEAL  . MICROLET LANCETS MISC Check sugar once daily, [employer] Microlet Lancets. Dx E11.9  . pantoprazole (PROTONIX) 40 MG tablet Take 1 tablet (40 mg total) by mouth daily.  Marland Kitchen spironolactone (ALDACTONE) 25 MG tablet Take 1 tablet by mouth once daily  . torsemide (DEMADEX) 20 MG tablet Take by mouth.   No facility-administered encounter medications on file as of 11/17/2019.    Allergies (verified) Maxzide [triamterene-hctz]   History: Past Medical History:  Diagnosis Date  . Barrett's esophagus   . Cancer ([place])    skin ca  . Diabetes mellitus ([place])   . Gout   . History of colon cancer    adenomatous polyps  . Hypercholesterolemia   . Hypertension   . Inflammatory arthritis   . Osteoarthritis    knees, spine  . Phlebitis    x2 (with pregnancy)  . 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 Pulmonary hypertension ([place])   . Reflux esophagitis   . Rheumatoid arthritis(714.0)    positive RF, FANA, RNP, negative CCP ab and anti DNA,.  neg anti-SCL 70  . 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 ABDOMINAL AORTOGRAM W/LOWER EXTREMITY Left 05/16/2019   Procedure: ABDOMINAL AORTOGRAM W/LOWER EXTREMITY;  Surgeon: Lorretta Harp, MD;  Location: [facility];  Service: Cardiovascular;  Laterality: Left;  . AMPUTATION TOE Left 06/02/2019   Procedure: AMPUTATION TOE;  Surgeon: Albertine Patricia, DPM;  Location: [facility];  Service: Podiatry;  Laterality: Left;  . BREAST CYST ASPIRATION Left    neg  . INCISION AND DRAINAGE OF WOUND Left 06/07/2019   Procedure: IRRIGATION AND DEBRIDEMENT WOUND;  Surgeon: Albertine Patricia, DPM;  Location: [facility];  Service: Podiatry;  Laterality: Left;  . LOWER EXTREMITY ANGIOGRAPHY Left 06/03/2019   Procedure: Lower Extremity Angiography;  Surgeon: Katha Cabal, MD;  Location:  [facility];  Service: Cardiovascular;  Laterality: Left;  . PERIPHERAL VASCULAR BALLOON ANGIOPLASTY Left 05/16/2019   Procedure: PERIPHERAL VASCULAR BALLOON ANGIOPLASTY;  Surgeon: Lorretta Harp, MD;  Location: [facility];  Service: Cardiovascular;  Laterality: Left;  SFA  . VEIN LIGATION AND STRIPPING     Family History  Problem Relation Age of Onset  . Arthritis Mother   . Heart attack Father   . Colon cancer Neg Hx   . Breast cancer Neg Hx    Social History   Socioeconomic History  . Marital status: Married    Spouse name: Not on file  . Number of children: Not on file  . Years of education: Not on file  . Highest education level: Not on file  Occupational History  . Not on file  Tobacco Use  . Smoking status: Former Smoker    Packs/day: 0.50    Years: 15.00    Pack years: 7.50    Quit date: 05/12/1988    Years since quitting: 31.5  . Smokeless tobacco: Never Used  Vaping Use  . Vaping Use: Never used  Substance and Sexual Activity  . Alcohol use: No    Alcohol/week: 0.0 standard drinks  . Drug use: No  . Sexual activity: Yes  Other Topics Concern  . Not on file  Social History Narrative  . Not on file   Social Determinants of Health   Financial Resource Strain:   . Difficulty of Paying Living Expenses:   Food Insecurity:   . Worried About Charity fundraiser in the Last Year:   . Arboriculturist in the Last Year:   Transportation Needs:   . Film/video editor (Medical):   Marland Kitchen Lack of Transportation (Non-Medical):   Physical Activity:   . Days of Exercise per Week:   . Minutes of Exercise per Session:   Stress:   . Feeling of Stress :   Social Connections: Socially Integrated  . Frequency of Communication with Friends and Family: More than three times a week  . Frequency of Social Gatherings with Friends and Family: Not on file  . Attends Religious Services: More than 4 times per year  . Active Member of Clubs or Organizations: Yes    . Attends Archivist Meetings: More than 4 times per year  . Marital Status: Married    Tobacco Counseling Counseling given: Not Answered   Clinical Intake:  Pre-visit preparation completed: Yes        Diabetes: Yes (Followed by pcp)  How often do you need to have someone help you when you read instructions, pamphlets, or other written materials from your doctor or pharmacy?: 1 - Never  Interpreter Needed?: No      Activities of Daily Living In your present state of health, do you have any difficulty performing the following activities: 11/17/2019 06/01/2019  Hearing? N -  Vision? N -  Difficulty  concentrating or making decisions? N -  Walking or climbing stairs? Y -  Comment Unsteady gait; cane, walker in use -  Dressing or bathing? N -  Doing errands, shopping? Y N  Comment She does not drive currently Facilities manager and eating ? Y -  Comment Husband prepares meals. Self feeds. -  Using the Toilet? N -  In the past six months, have you accidently leaked urine? Y -  Comment Managed with daily pad. Urology scheduled 11/21/19. -  Do you have problems with loss of bowel control? N -  Managing your Medications? N -  Managing your Finances? N -  Housekeeping or managing your Housekeeping? Y -  Comment Husband assist -  Some recent data might be hidden    Patient Care Team: Einar Pheasant, MD as PCP - General (Internal Medicine) Lorretta Harp, MD as PCP - Cardiology (Cardiology)  Indicate any recent Medical Services you may have received from other than [facility] providers in the past year (date may be approximate).     Assessment:   This is a routine wellness examination for Sanja.  I connected with Khiana today by telephone and verified that I am speaking with the correct person using two identifiers. Location patient: home Location provider: work Persons participating in the virtual visit: patient, Marine scientist.    I discussed the limitations, risks,  security and privacy concerns of performing an evaluation and management service by telephone and the availability of in person appointments. The patient expressed understanding and verbally consented to this telephonic visit.    Interactive audio and video telecommunications were attempted between this provider and patient, however failed, due to patient having technical difficulties OR patient did not have access to video capability.  We continued and completed visit with audio only.  Some vital signs may be absent or patient reported.   Hearing/Vision screen  Hearing Screening   125Hz  250Hz  500Hz  1000Hz  2000Hz  3000Hz  4000Hz  6000Hz  8000Hz   Right ear:           Left ear:           Comments: Patient is able to hear conversational tones without difficulty.  No issues reported.  Vision Screening Comments: Followed by Thedora Hinders Wears corrective lenses Visual acuity not assessed, virtual visit.  They have seen their ophthalmologist in the last 12 months.     Dietary issues and exercise activities discussed: Current Exercise Habits: The patient does not participate in regular exercise at present  Regular diet; monitors bread intake Good water intake Caffeine- 1 cup of coffee  Goals      Patient Stated   .  Low carb diet (pt-stated)      Depression Screen PHQ 2/9 Scores 11/17/2019 11/09/2018 11/06/2017 12/08/2016 04/14/2016 03/20/2016 12/14/2015  PHQ - 2 Score 0 1 0 0 0 0 0    Fall Risk Fall Risk  11/17/2019 11/09/2018 11/06/2017 12/08/2016 04/14/2016  Falls in the past year? 0 0 No No No  Comment - - - - -  Number falls in past yr: 0 - - - -  Follow up Falls evaluation completed - - - -   Handrails in use when climbing stairs? Yes  Home free of loose throw rugs in walkways, pet beds, electrical cords, etc? Yes  Adequate lighting in your home to reduce risk of falls? Yes   ASSISTIVE DEVICES UTILIZED TO PREVENT FALLS: Life alert? No  Use of a cane, walker or w/c? Yes  Grab  bars  in the bathroom? Yes  Shower chair or bench in shower? Yes  Elevated toilet seat or a handicapped toilet? Yes   TIMED UP AND GO:  Was the test performed? No .   Cognitive Function: MMSE - Mini Mental State Exam 11/06/2017 03/20/2016  Not completed: - Unable to complete  Orientation to time 5 -  Orientation to Place 5 -  Registration 3 -  Attention/ Calculation 5 -  Recall 2 -  Language- name 2 objects 2 -  Language- repeat 1 -  Language- follow 3 step command 3 -  Language- read & follow direction 1 -  Write a sentence 1 -  Copy design 1 -  Total score 29 -     6CIT Screen 11/17/2019 11/09/2018  What Year? 0 points 0 points  What month? 0 points 0 points  What time? - 0 points  Count back from 20 - 0 points  Months in reverse 0 points 0 points  Repeat phrase 0 points -   Immunizations Immunization History  Administered Date(s) Administered  . Fluad Quad(high Dose 65+) 02/07/2019  . Influenza Split 02/09/2013  . Influenza Whole 02/18/2017  . Influenza, High Dose Seasonal PF 04/14/2016, 02/23/2018  . Influenza,inj,Quad PF,6+ Mos 02/28/2014, 02/01/2015  . Influenza-Unspecified 02/20/2012  . PFIZER SARS-COV-2 Vaccination 07/07/2019, 07/28/2019  . Pneumococcal Conjugate-13 09/06/2013  . Pneumococcal Polysaccharide-23 06/12/2017    TDAP status: Due, Education has been provided regarding the importance of this vaccine. Advised may receive this vaccine at local pharmacy or Health Dept. Aware to provide a copy of the vaccination record if obtained from local pharmacy or Health Dept. Verbalized acceptance and understanding. Deferred.   Zostavax completed No   Shingrix Completed?: No.    Education has been provided regarding the importance of this vaccine. Patient has been advised to call insurance company to determine out of pocket expense if they have not yet received this vaccine. Advised may also receive vaccine at local pharmacy or Health Dept. Verbalized acceptance and  understanding.  Health Maintenance Health Maintenance  Topic Date Due  . OPHTHALMOLOGY EXAM  11/03/2017  . MAMMOGRAM  10/18/2020 (Originally 03/04/2019)  . DEXA SCAN  11/16/2020 (Originally 05/22/2006)  . TETANUS/TDAP  11/16/2020 (Originally 05/22/1960)  . Hepatitis C Screening  11/16/2020 (Originally 06/23/1940)  . INFLUENZA VACCINE  12/11/2019  . HEMOGLOBIN A1C  04/19/2020  . FOOT EXAM  10/24/2020  . COVID-19 Vaccine  Completed  . PNA vac Low Risk Adult  Completed   Eye exam- annual scheduled next week.   Dexa Scan- deferred per patient.   Hepatitis C Screening- deferred per patient.  Dental Screening: Recommended annual dental exams for proper oral hygiene. Partial dentures.   Community Resource Referral / Chronic Care Management: CRR required this visit?  No   CCM required this visit?  No     Plan:   Keep all routine maintenance appointments.   Next scheduled lab 12/13/19 @ 11:15  Follow up 02/29/20  I have personally reviewed and noted the following in the patient's chart:   . Medical and social history . Use of alcohol, tobacco or illicit drugs  . Current medications and supplements . Functional ability and status . Nutritional status . Physical activity . Advanced directives . List of other physicians . Hospitalizations, surgeries, and ER visits in previous 12 months . Vitals . Screenings to include cognitive, depression, and falls . Referrals and appointments  In addition, I have reviewed and discussed with patient certain preventive protocols, quality metrics, and  best practice recommendations. A written personalized care plan for preventive services as well as general preventive health recommendations were provided to patient via mychart.     Varney Biles, LPN   11/09/8548     Reviewed above information.  Agree with assessment and plan.  Dr Nicki Reaper

## 2019-11-18 ENCOUNTER — Other Ambulatory Visit: Payer: Self-pay

## 2019-11-18 ENCOUNTER — Other Ambulatory Visit (INDEPENDENT_AMBULATORY_CARE_PROVIDER_SITE_OTHER): Payer: Medicare Other

## 2019-11-18 DIAGNOSIS — N1832 Chronic kidney disease, stage 3b: Secondary | ICD-10-CM

## 2019-11-18 DIAGNOSIS — I10 Essential (primary) hypertension: Secondary | ICD-10-CM | POA: Diagnosis not present

## 2019-11-18 LAB — BASIC METABOLIC PANEL
BUN: 19 mg/dL (ref 6–23)
CO2: 25 mEq/L (ref 19–32)
Calcium: 9.5 mg/dL (ref 8.4–10.5)
Chloride: 104 mEq/L (ref 96–112)
Creatinine, Ser: 1.09 mg/dL (ref 0.40–1.20)
GFR: 48.48 mL/min — ABNORMAL LOW (ref >60.00)
Glucose, Bld: 148 mg/dL — ABNORMAL HIGH (ref 70–99)
Potassium: 4.5 mEq/L (ref 3.5–5.1)
Sodium: 138 mEq/L (ref 135–145)

## 2019-11-18 LAB — URINALYSIS, ROUTINE W REFLEX MICROSCOPIC
Hgb urine dipstick: NEGATIVE
Nitrite: NEGATIVE
Specific Gravity, Urine: 1.025 (ref 1.000–1.030)
Urine Glucose: NEGATIVE
Urobilinogen, UA: 0.2 (ref 0.0–1.0)
pH: 6 (ref 5.0–8.0)

## 2019-11-18 LAB — TSH: TSH: 4.44 u[IU]/mL (ref 0.35–4.50)

## 2019-11-19 ENCOUNTER — Telehealth: Payer: Self-pay | Admitting: Internal Medicine

## 2019-11-19 NOTE — Progress Notes (Signed)
11/21/2019 11:02 AM   [name] Sep 26, 1940 [id number]  Referring provider: Dale Yalaha, MD [address] [place],  [place] [address] Chief Complaint  Patient presents with  . Other    HPI: Nina Perez is a 79 y.o. female who presents today for evaluation and management following an abnormal renal ultrasound.   -Renal ultrasound on 11/03/2019 revealed a subtle area of increased echogenicity at the corticomedullary junction of the lower pole of the left kidney measures approximately 1.3 cm.   -No flank, abdominal pain -She has nocturia x 3-4 nightly -Denies gross hematuria -No prior urologic history   PMH: Past Medical History:  Diagnosis Date  . Barrett's esophagus   . Cancer (HCC)    skin ca  . Diabetes mellitus (HCC)   . Gout   . History of colon cancer    adenomatous polyps  . Hypercholesterolemia   . Hypertension   . Inflammatory arthritis   . Osteoarthritis    knees, spine  . Phlebitis    x2 (with pregnancy)  . 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 Pulmonary hypertension (HCC)   . Reflux esophagitis   . Rheumatoid arthritis(714.0)    positive RF, FANA, RNP, negative CCP ab and anti DNA,.  neg anti-SCL 70  . Scleroderma (HCC)    raynaud's, sclerodactyly, telangiectasias    Surgical History: Past Surgical History:  Procedure Laterality Date  . ABDOMINAL AORTOGRAM W/LOWER EXTREMITY Left 05/16/2019   Procedure: ABDOMINAL AORTOGRAM W/LOWER EXTREMITY;  Surgeon: Runell Gess, MD;  Location: [facility] INVASIVE CV LAB;  Service: Cardiovascular;  Laterality: Left;  . AMPUTATION TOE Left 06/02/2019   Procedure: AMPUTATION TOE;  Surgeon: Recardo Evangelist, DPM;  Location: [facility] ORS;  Service: Podiatry;  Laterality: Left;  . BREAST CYST ASPIRATION Left    neg  . INCISION AND DRAINAGE OF WOUND Left 06/07/2019   Procedure: IRRIGATION AND DEBRIDEMENT WOUND;  Surgeon: Recardo Evangelist, DPM;  Location: [facility] ORS;  Service: Podiatry;  Laterality: Left;    . LOWER EXTREMITY ANGIOGRAPHY Left 06/03/2019   Procedure: Lower Extremity Angiography;  Surgeon: Renford Dills, MD;  Location: [facility] INVASIVE CV LAB;  Service: Cardiovascular;  Laterality: Left;  . PERIPHERAL VASCULAR BALLOON ANGIOPLASTY Left 05/16/2019   Procedure: PERIPHERAL VASCULAR BALLOON ANGIOPLASTY;  Surgeon: Runell Gess, MD;  Location: [facility] INVASIVE CV LAB;  Service: Cardiovascular;  Laterality: Left;  SFA  . VEIN LIGATION AND STRIPPING      Home Medications:  Allergies as of 11/21/2019      Reactions   Maxzide [triamterene-hctz] Other (See Comments)   Weakness/fatigue      Medication List       Accurate as of November 21, 2019 11:02 AM. If you have any questions, ask your nurse or doctor.        acetaminophen 650 MG CR tablet Commonly known as: TYLENOL Take 650 mg by mouth every 4 (four) hours as needed for pain.   allopurinol 300 MG tablet Commonly known as: ZYLOPRIM Take 1 tablet by mouth once daily   amLODipine 10 MG tablet Commonly known as: NORVASC TAKE 1 TABLET BY MOUTH DAILY   aspirin 81 MG tablet Take 81 mg by mouth daily. Reported on 07/24/2015   atorvastatin 80 MG tablet Commonly known as: LIPITOR Take 1 tablet (80 mg total) by mouth daily at 6 PM.   clopidogrel 75 MG tablet Commonly known as: PLAVIX Take 1 tablet (75 mg total) by mouth daily with breakfast.   folic  acid 1 MG tablet Commonly known as: FOLVITE Take 1 mg by mouth every evening.   gabapentin 100 MG capsule Commonly known as: NEURONTIN Take 1 capsule (100 mg total) by mouth 3 (three) times daily.   glucose blood test strip Commonly known as: IT consultant USE  STRIP TO CHECK GLUCOSE TWICE DAILY. Dx: E11.9   losartan 100 MG tablet Commonly known as: COZAAR Take 1 tablet by mouth once daily   metFORMIN 500 MG tablet Commonly known as: GLUCOPHAGE Take 1 tablet by mouth once daily   metoprolol succinate 50 MG 24 hr tablet Commonly known as: TOPROL-XL TAKE 1 TABLET BY  MOUTH ONCE DAILY IMMEDIATELY FOLLOWING A MEAL   Microlet Lancets Misc Check sugar once daily, [employer] Microlet Lancets. Dx E11.9   pantoprazole 40 MG tablet Commonly known as: PROTONIX Take 1 tablet (40 mg total) by mouth daily.   spironolactone 25 MG tablet Commonly known as: ALDACTONE Take 1 tablet by mouth once daily   torsemide 20 MG tablet Commonly known as: DEMADEX Take by mouth.       Allergies:  Allergies  Allergen Reactions  . Maxzide [Triamterene-Hctz] Other (See Comments)    Weakness/fatigue     Family History: Family History  Problem Relation Age of Onset  . Arthritis Mother   . Heart attack Father   . Colon cancer Neg Hx   . Breast cancer Neg Hx     Social History:  reports that she quit smoking about 31 years ago. She has a 7.50 pack-year smoking history. She has never used smokeless tobacco. She reports that she does not drink alcohol and does not use drugs.   Physical Exam: BP (!) 143/80 (BP Location: Left Arm, Patient Position: Sitting, Cuff Size: Large)   Pulse 74   Ht 5\' 6"  (1.676 m)   Wt 220 lb (99.8 kg)   LMP 05/25/1988   BMI 35.51 kg/m   Constitutional:  Alert and oriented, No acute distress. HEENT: [facility] AT, moist mucus membranes.  Trachea midline, no masses. Cardiovascular: No clubbing, cyanosis, or edema. Respiratory: Normal respiratory effort, no increased work of breathing. Skin: No rashes, bruises or suspicious lesions. Neurologic: Grossly intact, no focal deficits, moving all 4 extremities. Psychiatric: Normal mood and affect.  Laboratory Data:  Lab Results  Component Value Date   CREATININE 1.09 11/18/2019    Pertinent Imaging:  US Renal  Narrative CLINICAL DATA:  Renal insufficiency.  EXAM: RENAL / URINARY TRACT ULTRASOUND COMPLETE  COMPARISON:  Abdominal ultrasound on 04/26/2018  FINDINGS: Right Kidney:  Renal measurements: 10.2 x 4.3 x 5.0 cm = volume: 114 mL. Similar appearance right kidney compared to  prior ultrasound. No hydronephrosis or focal lesion.  Left Kidney:  Renal measurements: 11.2 x 5.0 x 4.5 cm = volume: 132 mL. No hydronephrosis. Subtle area of increased echogenicity within the deep cortex of the lower pole near the corticomedullary junction measures roughly 1.3 cm. This may represent simply some asymmetric prominence of fat but a subtle lesion is not excluded. On the prior study this probably was present.  Bladder:  Appears normal for degree of bladder distention.  Other:  None.  IMPRESSION: No evidence of renal obstruction or significant progressive renal atrophy since 2019. Subtle area of increased echogenicity at the corticomedullary junction of the lower pole of the left kidney measures approximately 1.3 cm. This appears to probably have been present when reviewing the prior ultrasound images but on the imaging appears slightly more conspicuous. Subtle renal lesion is not  excluded. Given the history of renal insufficiency, contrast enhanced exams may be currently unable to be performed. Consider follow-up with either renal ultrasound or unenhanced MRI of the abdomen.   Electronically Signed By: Irish Lack M.D. On: 11/04/2019 15:21   I have personally reviewed the images and agree with radiologist interpretation.     Assessment & Plan:    1. Abnormal Renal Ultrasound  -Subtle findings which may not be pathologic  -Discussed a follow up renal ultrasound in 6 month vs renal mass protocol MRI  -Patient elected renal ultrasound -Follow-up 6 months with [facility] [address] [place], [place] [address] [phone]  I, Theador Hawthorne, am acting as a scribe for Dr. Lorin Picket C. Sury Wentworth,  I have reviewed the above documentation for accuracy and completeness, and I agree with the above.   Riki Altes, MD

## 2019-11-19 NOTE — Telephone Encounter (Signed)
Please cancel lab appt scheduled for 12/13/19.  I informed pt via my chart message that she did not need to keep the 12/13/19 appt.

## 2019-11-21 ENCOUNTER — Ambulatory Visit (INDEPENDENT_AMBULATORY_CARE_PROVIDER_SITE_OTHER): Payer: Medicare Other | Admitting: Urology

## 2019-11-21 ENCOUNTER — Encounter: Payer: Self-pay | Admitting: Urology

## 2019-11-21 ENCOUNTER — Encounter: Payer: Self-pay | Admitting: Internal Medicine

## 2019-11-21 ENCOUNTER — Encounter (INDEPENDENT_AMBULATORY_CARE_PROVIDER_SITE_OTHER): Payer: Self-pay | Admitting: Nurse Practitioner

## 2019-11-21 ENCOUNTER — Ambulatory Visit (INDEPENDENT_AMBULATORY_CARE_PROVIDER_SITE_OTHER): Payer: Medicare Other | Admitting: Nurse Practitioner

## 2019-11-21 ENCOUNTER — Ambulatory Visit (INDEPENDENT_AMBULATORY_CARE_PROVIDER_SITE_OTHER): Payer: Medicare Other

## 2019-11-21 ENCOUNTER — Other Ambulatory Visit: Payer: Self-pay

## 2019-11-21 VITALS — BP 138/73 | HR 69 | Ht 66.0 in | Wt 220.0 lb

## 2019-11-21 VITALS — BP 143/80 | HR 74 | Ht 66.0 in | Wt 220.0 lb

## 2019-11-21 DIAGNOSIS — I739 Peripheral vascular disease, unspecified: Secondary | ICD-10-CM

## 2019-11-21 DIAGNOSIS — N281 Cyst of kidney, acquired: Secondary | ICD-10-CM | POA: Insufficient documentation

## 2019-11-21 DIAGNOSIS — I70249 Atherosclerosis of native arteries of left leg with ulceration of unspecified site: Secondary | ICD-10-CM

## 2019-11-21 DIAGNOSIS — N2889 Other specified disorders of kidney and ureter: Secondary | ICD-10-CM

## 2019-11-21 DIAGNOSIS — Z9862 Peripheral vascular angioplasty status: Secondary | ICD-10-CM

## 2019-11-21 DIAGNOSIS — I10 Essential (primary) hypertension: Secondary | ICD-10-CM | POA: Diagnosis not present

## 2019-11-21 DIAGNOSIS — E7800 Pure hypercholesterolemia, unspecified: Secondary | ICD-10-CM

## 2019-11-21 DIAGNOSIS — I809 Phlebitis and thrombophlebitis of unspecified site: Secondary | ICD-10-CM | POA: Insufficient documentation

## 2019-11-21 DIAGNOSIS — I890 Lymphedema, not elsewhere classified: Secondary | ICD-10-CM

## 2019-11-21 DIAGNOSIS — D126 Benign neoplasm of colon, unspecified: Secondary | ICD-10-CM | POA: Insufficient documentation

## 2019-11-21 NOTE — Progress Notes (Signed)
Subjective:    Patient ID: Nina Perez, female    DOB: April 18, 1941, 79 y.o.   MRN: [id number] Chief Complaint  Patient presents with  . Follow-up    U/S follow up    The patient presents today as a referral by Dr. Elvina Mattes.  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  At that time angioplasty of the left anterior tibial artery was done.  Since that time the patient's wound has completely healed.  The patient denies claudication however due to knee issues she is not walking frequently.  She denies any new ulcerations, discoloration of her toes, or rest pain like symptoms.  Unfortunately post angiogram the patient was lost to follow-up so this is actually her first follow-up visit after her angiogram.  However, the patient has a another issue that is more pressing for her.  The patient has been worsening lower extremity edema bilaterally.  The patient notes that she has had a bilateral vein stripping many years ago.  She denies any weeping of her bilateral lower extremities.  The patient does have some redness present however she denies any pain and notes that the redness of her lower extremities is consistent with her baseline.  The patient notes that she was recently on diuretics however her primary care doctor stopped her diuretics due to worsening renal function.  Since then the swelling has been much worse.  The patient does have medical grade 1 compression stockings however she has not been wearing them due to the recent wound on her foot.  She does note that she elevates her lower extremities much as possible.  Walking regularly is difficult due to her knee.  Today noninvasive studies show an ABI of 0.84 on the right and 1.01 on the left.  The patient has monophasic anterior tibial artery waveforms with biphasic posterior tibial artery waveforms.  The patient does have dampened toe waveforms bilaterally.   Review of  Systems  Cardiovascular: Positive for leg swelling.  Musculoskeletal: Positive for arthralgias and joint swelling.  Skin: Positive for rash (Stasis dermatitis).  All other systems reviewed and are negative.      Objective:   Physical Exam Vitals reviewed.  HENT:     Head: Normocephalic.  Cardiovascular:     Rate and Rhythm: Normal rate and regular rhythm.     Pulses: Decreased pulses.  Pulmonary:     Effort: Pulmonary effort is normal.  Musculoskeletal:     Right lower leg: 2+ Edema present.     Left lower leg: 2+ Edema present.  Skin:    General: Skin is warm and dry.     Comments: Stasis dermatitis bilaterally  Neurological:     Mental Status: She is alert.  Psychiatric:        Mood and Affect: Mood normal.        Behavior: Behavior normal.        Thought Content: Thought content normal.        Judgment: Judgment normal.     BP 138/73   Pulse 69   Ht 5\' 6"  (1.676 m)   Wt 220 lb (99.8 kg)   LMP 05/25/1988   BMI 35.51 kg/m   Past Medical History:  Diagnosis Date  . Barrett's esophagus   . Cancer ([place])    skin ca  . Diabetes mellitus ([place])   . Gout   . History of colon cancer  adenomatous polyps  . Hypercholesterolemia   . Hypertension   . Inflammatory arthritis   . Osteoarthritis    knees, spine  . Phlebitis    x2 (with pregnancy)  . 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 Pulmonary hypertension ([place])   . Reflux esophagitis   . Rheumatoid arthritis(714.0)    positive RF, FANA, RNP, negative CCP ab and anti DNA,.  neg anti-SCL 70  . Scleroderma ([place])    raynaud's, sclerodactyly, telangiectasias    Social History   Socioeconomic History  . Marital status: Married    Spouse name: Not on file  . Number of children: Not on file  . Years of education: Not on file  . Highest education level: Not on file  Occupational History  . Not on file  Tobacco Use  . Smoking status: Former Smoker    Packs/day: 0.50    Years: 15.00    Pack years: 7.50     Quit date: 05/12/1988    Years since quitting: 31.5  . Smokeless tobacco: Never Used  Vaping Use  . Vaping Use: Never used  Substance and Sexual Activity  . Alcohol use: No    Alcohol/week: 0.0 standard drinks  . Drug use: No  . Sexual activity: Yes  Other Topics Concern  . Not on file  Social History Narrative  . Not on file   Social Determinants of Health   Financial Resource Strain:   . Difficulty of Paying Living Expenses:   Food Insecurity:   . Worried About Charity fundraiser in the Last Year:   . Arboriculturist in the Last Year:   Transportation Needs:   . Film/video editor (Medical):   Marland Kitchen Lack of Transportation (Non-Medical):   Physical Activity:   . Days of Exercise per Week:   . Minutes of Exercise per Session:   Stress:   . Feeling of Stress :   Social Connections: Socially Integrated  . Frequency of Communication with Friends and Family: More than three times a week  . Frequency of Social Gatherings with Friends and Family: Not on file  . Attends Religious Services: More than 4 times per year  . Active Member of Clubs or Organizations: Yes  . Attends Archivist Meetings: More than 4 times per year  . Marital Status: Married  Human resources officer Violence:   . Fear of Current or Ex-Partner:   . Emotionally Abused:   Marland Kitchen Physically Abused:   . Sexually Abused:     Past Surgical History:  Procedure Laterality Date  . ABDOMINAL AORTOGRAM W/LOWER EXTREMITY Left 05/16/2019   Procedure: ABDOMINAL AORTOGRAM W/LOWER EXTREMITY;  Surgeon: Lorretta Harp, MD;  Location: [place] CV LAB;  Service: Cardiovascular;  Laterality: Left;  . AMPUTATION TOE Left 06/02/2019   Procedure: AMPUTATION TOE;  Surgeon: Albertine Patricia, DPM;  Location: [facility] ORS;  Service: Podiatry;  Laterality: Left;  . BREAST CYST ASPIRATION Left    neg  . INCISION AND DRAINAGE OF WOUND Left 06/07/2019   Procedure: IRRIGATION AND DEBRIDEMENT WOUND;  Surgeon: Albertine Patricia, DPM;   Location: [facility] ORS;  Service: Podiatry;  Laterality: Left;  . LOWER EXTREMITY ANGIOGRAPHY Left 06/03/2019   Procedure: Lower Extremity Angiography;  Surgeon: Katha Cabal, MD;  Location: [place] CV LAB;  Service: Cardiovascular;  Laterality: Left;  . PERIPHERAL VASCULAR BALLOON ANGIOPLASTY Left 05/16/2019   Procedure: PERIPHERAL VASCULAR BALLOON ANGIOPLASTY;  Surgeon: Lorretta Harp, MD;  Location: [place] CV LAB;  Service: Cardiovascular;  Laterality: Left;  SFA  . VEIN LIGATION AND STRIPPING      Family History  Problem Relation Age of Onset  . Arthritis Mother   . Heart attack Father   . Colon cancer Neg Hx   . Breast cancer Neg Hx     Allergies  Allergen Reactions  . Maxzide [Triamterene-Hctz] Other (See Comments)    Weakness/fatigue        Assessment & Plan:   1. PAD (peripheral artery disease) (HCC) Due to the patient's dampened toe waveforms and we will have the patient return in 3 months to obtain close follow-up.  The patient is advised to contact her office if she develops discoloration of her lower extremities, ulceration or rest pain like symptoms.  Otherwise patient is encouraged to walk as possible.  2. Essential hypertension Continue antihypertensive medications as already ordered, these medications have been reviewed and there are no changes at this time.   3. Hypercholesterolemia Continue statin as ordered and reviewed, no changes at this time   4. Lymphedema The patient's worsening edema is likely multifactorial in cause.  The patient is following up with her urologist soon.  The patient has pulmonary and nephrotic issues which are complicating the ability to manage her edema.  The patient should begin to use her medical grade 1 compression stockings on a daily basis the patient should not sleep in the stockings.  The patient elevate her lower extremities much as possible and if she is able water aerobics may be helpful with lessening the knee  pain so the patient is able to exercise somewhat.  The patient is instructed to utilize conservative therapy until follow-up.  At which time we can assess the lymphedema pump would be useful for the patient.  Patient is also instructed to call if she begins to have weeping or venous ulcerations.   Current Outpatient Medications on File Prior to Visit  Medication Sig Dispense Refill  . acetaminophen (TYLENOL) 650 MG CR tablet Take 650 mg by mouth every 4 (four) hours as needed for pain.     Marland Kitchen allopurinol (ZYLOPRIM) 300 MG tablet Take 1 tablet by mouth once daily 90 tablet 0  . amLODipine (NORVASC) 10 MG tablet TAKE 1 TABLET BY MOUTH DAILY 90 tablet 3  . aspirin 81 MG tablet Take 81 mg by mouth daily. Reported on 07/24/2015    . atorvastatin (LIPITOR) 80 MG tablet Take 1 tablet (80 mg total) by mouth daily at 6 PM. 90 tablet 1  . clopidogrel (PLAVIX) 75 MG tablet Take 1 tablet (75 mg total) by mouth daily with breakfast. 90 tablet 2  . folic acid (FOLVITE) 1 MG tablet Take 1 mg by mouth every evening.     Marland Kitchen glucose blood (BAYER CONTOUR TEST) test strip USE  STRIP TO CHECK GLUCOSE TWICE DAILY. Dx: E11.9 100 each 12  . losartan (COZAAR) 100 MG tablet Take 1 tablet by mouth once daily 90 tablet 0  . metFORMIN (GLUCOPHAGE) 500 MG tablet Take 1 tablet by mouth once daily 90 tablet 0  . metoprolol succinate (TOPROL-XL) 50 MG 24 hr tablet TAKE 1 TABLET BY MOUTH ONCE DAILY IMMEDIATELY FOLLOWING A MEAL 90 tablet 0  . MICROLET LANCETS MISC Check sugar once daily, [employer] Microlet Lancets. Dx E11.9 100 each 12  . pantoprazole (PROTONIX) 40 MG tablet Take 1 tablet (40 mg total) by mouth daily. 30 tablet 3  . spironolactone (ALDACTONE) 25 MG tablet Take 1 tablet by mouth once daily  90 tablet 0  . gabapentin (NEURONTIN) 100 MG capsule Take 1 capsule (100 mg total) by mouth 3 (three) times daily. (Patient not taking: Reported on 11/21/2019) 90 capsule 1  . torsemide (DEMADEX) 20 MG tablet Take by mouth. (Patient  not taking: Reported on 11/21/2019)     No current facility-administered medications on file prior to visit.    There are no Patient Instructions on file for this visit. No follow-ups on file.   Kris Hartmann, NP

## 2019-11-21 NOTE — Telephone Encounter (Signed)
Cancelled.  

## 2019-11-23 ENCOUNTER — Other Ambulatory Visit: Payer: Self-pay | Admitting: Cardiology

## 2019-11-23 NOTE — Telephone Encounter (Signed)
This is Dr. Berry's pt 

## 2019-11-25 ENCOUNTER — Telehealth: Payer: Self-pay | Admitting: Internal Medicine

## 2019-11-25 MED ORDER — PANTOPRAZOLE SODIUM 40 MG PO TBEC: 40.0000 mg | DELAYED_RELEASE_TABLET | Freq: Every day | ORAL | 0 refills | Status: DC

## 2019-11-25 NOTE — Telephone Encounter (Signed)
Pt would like a refill of pantoprazole (PROTONIX) 40 MG tablet. She states that a provider at the ED prescribed it and that it works well. Wants to know if you could refill for her. Please advise

## 2019-11-25 NOTE — Telephone Encounter (Signed)
Sent to the pharmacy

## 2019-11-30 ENCOUNTER — Telehealth: Payer: Self-pay

## 2019-11-30 ENCOUNTER — Other Ambulatory Visit (INDEPENDENT_AMBULATORY_CARE_PROVIDER_SITE_OTHER): Payer: Medicare Other

## 2019-11-30 ENCOUNTER — Other Ambulatory Visit: Payer: Self-pay

## 2019-11-30 DIAGNOSIS — R300 Dysuria: Secondary | ICD-10-CM | POA: Diagnosis not present

## 2019-11-30 NOTE — Addendum Note (Signed)
Addended by: Lars Masson on: 11/30/2019 03:30 PM   Modules accepted: Orders

## 2019-11-30 NOTE — Telephone Encounter (Signed)
Pt called back returning your call °

## 2019-11-30 NOTE — Telephone Encounter (Signed)
Pt is coming in this PM to give urine sample and scheduled with Dr Nicki Reaper at [facility].

## 2019-11-30 NOTE — Telephone Encounter (Signed)
Will need to provide urine sample and I can see tomorrow. - 4:30.    Confirm no acute issues and let her know that I am not in the office today.

## 2019-11-30 NOTE — Telephone Encounter (Signed)
-Caller states she has burning with urination. She has urinary urgency and is only able to urinate a little. symptoms started Sunday or Monday. did one time treatment with Monistat which helped only a little. no fever. no back pain. uses [facility] in [place] [facility] - [place] RECORD AccessNurse Patient Name: Nina Perez Gender: Female DOB: 04/01/1941 Age: 79 Y 68 M 10 D Return Phone Number: [phone] (Primary), [phone] (Secondary) Address: City/State/Zip: [place] [address] Client [place] Client Site [place] - Day Physician Einar Pheasant - MD Contact Type Call Who Is Calling Patient / Member / Family / Caregiver Call Type Triage / Clinical Relationship To Patient Self Return Phone Number [phone] (Primary) Chief Complaint Abdominal Pain Reason for Call Symptomatic / Request for [place] states she thinks she may have a UTI. She is having abdominal pains that cause her to have urgency to urinate. She is having burning with urination as well. She did use a one time Monostat treatment that she says may have improved her sympotms a small amount. Translation No Nurse Assessment Nurse: Raphael Gibney, RN, Vanita Ingles Date/Time (Eastern Time): 11/30/2019 10:20:04 AM Confirm and document reason for call. If symptomatic, describe symptoms. ---Caller states she has burning with urination. She has urinary urgency and is only able to urinate a little. symptoms started Sunday or Monday. did one time treatment with Monistat which helped only a little. no fever. no back pain. uses [facility] in [place] Has the patient had close contact with a person known or suspected to have the novel coronavirus illness OR traveled / lives in area with major community spread (including international travel) in the last 14 days from the onset  of symptoms? * If Asymptomatic, screen for exposure and travel within the last 14 days. ---No Does the patient have any new or worsening symptoms? ---Yes Will a triage be completed? ---Yes Related visit to physician within the last 2 weeks? ---No Does the PT have any chronic conditions? (i.e. diabetes, asthma, this includes High risk factors for pregnancy, etc.) ---Yes List chronic conditions. ---diabetes Is this a behavioral health or substance abuse call? ---NoPLEASE NOTE: All timestamps contained within this report are represented as [place] Standard Time. CONFIDENTIALTY NOTICE: This fax transmission is intended only for the addressee. It contains information that is legally privileged, confidential or otherwise protected from use or disclosure. If you are not the intended recipient, you are strictly prohibited from reviewing, disclosing, copying using or disseminating any of this information or taking any action in reliance on or regarding this information. If you have received this fax in error, please notify us immediately by telephone so that we can arrange for its return to [place]. Phone: [phone], Toll-Free: [phone], Fax: [phone] Page: 2 of 3 Call Id: [id number] Guidelines Guideline Title Affirmed Question Affirmed Notes Nurse Date/Time Eilene Ghazi Time) Urination Pain - Female Diabetes mellitus or weak immune system (e.g., HIV positive, cancer chemo, splenectomy, organ transplant, chronic steroids) Raphael Gibney, RN, Vanita Ingles 11/30/2019 10:26:05 AM Disp. Time Eilene Ghazi Time) Disposition Final User 11/30/2019 10:36:39 AM Paged On Call back to [facility], RN, Vanita Ingles 11/30/2019 11:03:30 AM Called On-Call Provider Raphael Gibney, RN, Vanita Ingles 11/30/2019 10:34:25 AM See HCP within 4 Hours (or PCP triage) Yes Raphael Gibney, RN, Doreatha Lew Disagree/Comply Disagree Caller Understands Yes PreDisposition Call Doctor Care Advice Given Per Guideline SEE HCP WITHIN 4 HOURS (OR  PCP TRIAGE): * IF  OFFICE WILL BE OPEN: You need to be seen within the next 3 or 4 hours. Call your doctor (or NP/PA) now or as soon as the office opens. CALL BACK IF: * You become worse. CARE ADVICE given per Urination Pain - Female (Adult) guideline. Comments User: Dannielle Burn, RN Date/Time Eilene Ghazi Time): 11/30/2019 10:34:19 AM pt wants to know if medication can [facility] into [facility] in [place]. User: Dannielle Burn, RN Date/Time Eilene Ghazi Time): 11/30/2019 10:35:19 AM Called Kerin Salen in the office and left a message. Referrals REFERRED TO PCP OFFICE Paging DoctorName Phone DateTime Result/Outcome Message Type Notes Dr. Einar Pheasant 11/30/2019 10:36:39 AM Called On Call Provider - Left Message Doctor Paged Dr. Einar Pheasant 11/30/2019 11:03:29 AM Called On Call Provider - Reached Doctor Paged Dr. Einar Pheasant 11/30/2019 11:06:44 AM Spoke with On Call - General Message Result Called Kerin Salen and gave report that pt has 4 hr outcome. She states she does not know when the phone/internet will be fixed and pt needs to go to urgent care or ER. notified pt and she statesPLEASE NOTE: All timestamps contained within this report are represented as [place] Standard Time. CONFIDENTIALTY NOTICE: This fax transmission is intended only for the addressee. It contains information that is legally privileged, confidential or otherwise protected from use or disclosure. If you are not the intended recipient, you are strictly prohibited from reviewing, disclosing, copying using or disseminating any of this information or taking any action in reliance on or regarding this information. If you have received this fax in error, please notify us immediately by telephone so that we can arrange for its return to [place]. Phone: [phone], Toll-Free: [phone], Fax: [phone] Page: 3 of 3 Call Id: [id number] Paging DoctorName Phone DateTime Result/Outcome Message Type Notes she will wait and does not paln to  go to urgent care unless she gets worse

## 2019-11-30 NOTE — Telephone Encounter (Signed)
LMTCB

## 2019-12-01 ENCOUNTER — Telehealth (INDEPENDENT_AMBULATORY_CARE_PROVIDER_SITE_OTHER): Payer: Medicare Other | Admitting: Internal Medicine

## 2019-12-01 DIAGNOSIS — R300 Dysuria: Secondary | ICD-10-CM | POA: Diagnosis not present

## 2019-12-01 LAB — URINALYSIS, ROUTINE W REFLEX MICROSCOPIC
Ketones, ur: NEGATIVE
Nitrite: NEGATIVE
Specific Gravity, Urine: 1.025 (ref 1.000–1.030)
Total Protein, Urine: 100 — AB
Urine Glucose: NEGATIVE
Urobilinogen, UA: 0.2 (ref 0.0–1.0)
pH: 5.5 (ref 5.0–8.0)

## 2019-12-01 LAB — URINE CULTURE
MICRO NUMBER:: 10732382
SPECIMEN QUALITY:: ADEQUATE

## 2019-12-01 MED ORDER — CEPHALEXIN 500 MG PO CAPS
500.0000 mg | ORAL_CAPSULE | Freq: Two times a day (BID) | ORAL | 0 refills | Status: DC
Start: 2019-12-01 — End: 2020-02-29

## 2019-12-01 NOTE — Progress Notes (Signed)
Patient ID: Nina Perez, female   DOB: August 14, 1940, 79 y.o.   MRN: [id number]   Virtual Visit via telepho Note  This visit type was conducted due to national recommendations for restrictions regarding the COVID-19 pandemic (e.g. social distancing).  This format is felt to be most appropriate for this patient at this time.  All issues noted in this document were discussed and addressed.  No physical exam was performed (except for noted visual exam findings with Video Visits).   I connected with Edgardo Roys by telephone and verified that I am speaking with the correct person using two identifiers. Location patient: home Location provider: work Persons participating in the virtual visit: patient, provider  The limitations, risks, security and privacy concerns of performing an evaluation and management service by telephone and the availability of in person appointments have been . It has also been discussed with the patient that there may be a patient responsible charge related to this service. The patient expressed understanding and agreed to proceed.   Reason for visit: work in appt  HPI: Symptoms started several days ago.  Noticed dysuria.  No hematuria.  Urgency and some leakage.  No abdominal pain.  No fever.  No nausea or vomiting.  No diarrhea.  Nocturia.  Some itching.  Used monistat several days ago.  Helped some.  No vaginal discharge.     ROS: See pertinent positives and negatives per HPI.  Past Medical History:  Diagnosis Date  . Barrett's esophagus   . Cancer ([place])    skin ca  . Diabetes mellitus ([place])   . Gout   . History of colon cancer    adenomatous polyps  . Hypercholesterolemia   . Hypertension   . Inflammatory arthritis   . Osteoarthritis    knees, spine  . Phlebitis    x2 (with pregnancy)  . 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 Pulmonary hypertension ([place])   . Reflux esophagitis   . Rheumatoid arthritis(714.0)    positive RF, FANA, RNP, negative CCP ab and  anti DNA,.  neg anti-SCL 70  . 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 ABDOMINAL AORTOGRAM W/LOWER EXTREMITY Left 05/16/2019   Procedure: ABDOMINAL AORTOGRAM W/LOWER EXTREMITY;  Surgeon: Lorretta Harp, MD;  Location: [facility];  Service: Cardiovascular;  Laterality: Left;  . AMPUTATION TOE Left 06/02/2019   Procedure: AMPUTATION TOE;  Surgeon: Albertine Patricia, DPM;  Location: [facility];  Service: Podiatry;  Laterality: Left;  . BREAST CYST ASPIRATION Left    neg  . INCISION AND DRAINAGE OF WOUND Left 06/07/2019   Procedure: IRRIGATION AND DEBRIDEMENT WOUND;  Surgeon: Albertine Patricia, DPM;  Location: [facility];  Service: Podiatry;  Laterality: Left;  . LOWER EXTREMITY ANGIOGRAPHY Left 06/03/2019   Procedure: Lower Extremity Angiography;  Surgeon: Katha Cabal, MD;  Location: [facility];  Service: Cardiovascular;  Laterality: Left;  . PERIPHERAL VASCULAR BALLOON ANGIOPLASTY Left 05/16/2019   Procedure: PERIPHERAL VASCULAR BALLOON ANGIOPLASTY;  Surgeon: Lorretta Harp, MD;  Location: [facility];  Service: Cardiovascular;  Laterality: Left;  SFA  . VEIN LIGATION AND STRIPPING      Family History  Problem Relation Age of Onset  . Arthritis Mother   . Heart attack Father   . Colon cancer Neg Hx   . Breast cancer Neg Hx     SOCIAL HX: reviewed.    Current Outpatient  Medications:  .  acetaminophen (TYLENOL) 650 MG CR tablet, Take 650 mg by mouth every 4 (four) hours as needed for pain. , Disp: , Rfl:  .  allopurinol (ZYLOPRIM) 300 MG tablet, Take 1 tablet by mouth once daily, Disp: 90 tablet, Rfl: 0 .  amLODipine (NORVASC) 10 MG tablet, TAKE 1 TABLET BY MOUTH DAILY, Disp: 90 tablet, Rfl: 3 .  aspirin 81 MG tablet, Take 81 mg by mouth daily. Reported on 07/24/2015, Disp: , Rfl:  .  atorvastatin (LIPITOR) 80 MG tablet, Take 1 tablet (80 mg total) by mouth daily at 6 PM., Disp: 90  tablet, Rfl: 1 .  clopidogrel (PLAVIX) 75 MG tablet, Take 1 tablet (75 mg total) by mouth daily with breakfast., Disp: 90 tablet, Rfl: 2 .  folic acid (FOLVITE) 1 MG tablet, Take 1 mg by mouth every evening. , Disp: , Rfl:  .  gabapentin (NEURONTIN) 100 MG capsule, Take 1 capsule (100 mg total) by mouth 3 (three) times daily., Disp: 90 capsule, Rfl: 1 .  glucose blood (BAYER CONTOUR TEST) test strip, USE  STRIP TO CHECK GLUCOSE TWICE DAILY. Dx: E11.9, Disp: 100 each, Rfl: 12 .  losartan (COZAAR) 100 MG tablet, Take 1 tablet by mouth once daily, Disp: 90 tablet, Rfl: 0 .  metFORMIN (GLUCOPHAGE) 500 MG tablet, Take 1 tablet by mouth once daily, Disp: 90 tablet, Rfl: 0 .  metoprolol succinate (TOPROL-XL) 50 MG 24 hr tablet, TAKE 1 TABLET BY MOUTH ONCE DAILY IMMEDIATELY FOLLOWING A MEAL, Disp: 90 tablet, Rfl: 0 .  MICROLET LANCETS MISC, Check sugar once daily, [employer] Microlet Lancets. Dx E11.9, Disp: 100 each, Rfl: 12 .  pantoprazole (PROTONIX) 40 MG tablet, Take 1 tablet (40 mg total) by mouth daily., Disp: 90 tablet, Rfl: 0 .  spironolactone (ALDACTONE) 25 MG tablet, Take 1 tablet by mouth once daily, Disp: 90 tablet, Rfl: 0 .  cephALEXin (KEFLEX) 500 MG capsule, Take 1 capsule (500 mg total) by mouth 2 (two) times daily., Disp: 10 capsule, Rfl: 0 .  torsemide (DEMADEX) 20 MG tablet, Take by mouth.  (Patient not taking: Reported on 11/21/2019), Disp: , Rfl:   EXAM:  GENERAL: alert.  Sounds to be in no acute distress.  Answering questions appropriately.    PSYCH/NEURO: pleasant and cooperative, no obvious depression or anxiety, speech and thought processing grossly intact  ASSESSMENT AND PLAN:  Discussed the following assessment and plan:  Dysuria Urine dip and symptoms c/w uti.  Treat with keflex as directed. Await urine culture results.  Probiotics as directed.  Follow.    Meds ordered this encounter  Medications  . cephALEXin (KEFLEX) 500 MG capsule    Sig: Take 1 capsule (500 mg  total) by mouth 2 (two) times daily.    Dispense:  10 capsule    Refill:  0     I discussed the assessment and treatment plan with the patient. The patient was provided an opportunity to ask questions and all were answered. The patient agreed with the plan and demonstrated an understanding of the instructions.   The patient was advised to call back or seek an in-person evaluation if the symptoms worsen or if the condition fails to improve as anticipated.  I provided 18 minutes of non-face-to-face time during this encounter.   Einar Pheasant, MD

## 2019-12-02 ENCOUNTER — Other Ambulatory Visit: Payer: Self-pay | Admitting: Internal Medicine

## 2019-12-02 ENCOUNTER — Other Ambulatory Visit: Payer: Medicare Other

## 2019-12-02 DIAGNOSIS — E611 Iron deficiency: Secondary | ICD-10-CM

## 2019-12-02 NOTE — Progress Notes (Signed)
Opened in error

## 2019-12-11 ENCOUNTER — Encounter: Payer: Self-pay | Admitting: Internal Medicine

## 2019-12-11 DIAGNOSIS — R300 Dysuria: Secondary | ICD-10-CM | POA: Insufficient documentation

## 2019-12-11 NOTE — Assessment & Plan Note (Signed)
Urine dip and symptoms c/w uti.  Treat with keflex as directed. Await urine culture results.  Probiotics as directed.  Follow.

## 2019-12-13 ENCOUNTER — Other Ambulatory Visit: Payer: Medicare Other

## 2019-12-19 ENCOUNTER — Other Ambulatory Visit: Payer: Self-pay | Admitting: Internal Medicine

## 2019-12-21 IMAGING — DX DG THORACIC SPINE 3V
3 series · 3 of 3 positions shown · non-contrast
Comparison: Chest CT 08/26/2010

CLINICAL DATA: Right-sided thoracic back pain.

EXAM:
THORACIC SPINE - 3 VIEWS

[thoracic spine ap]
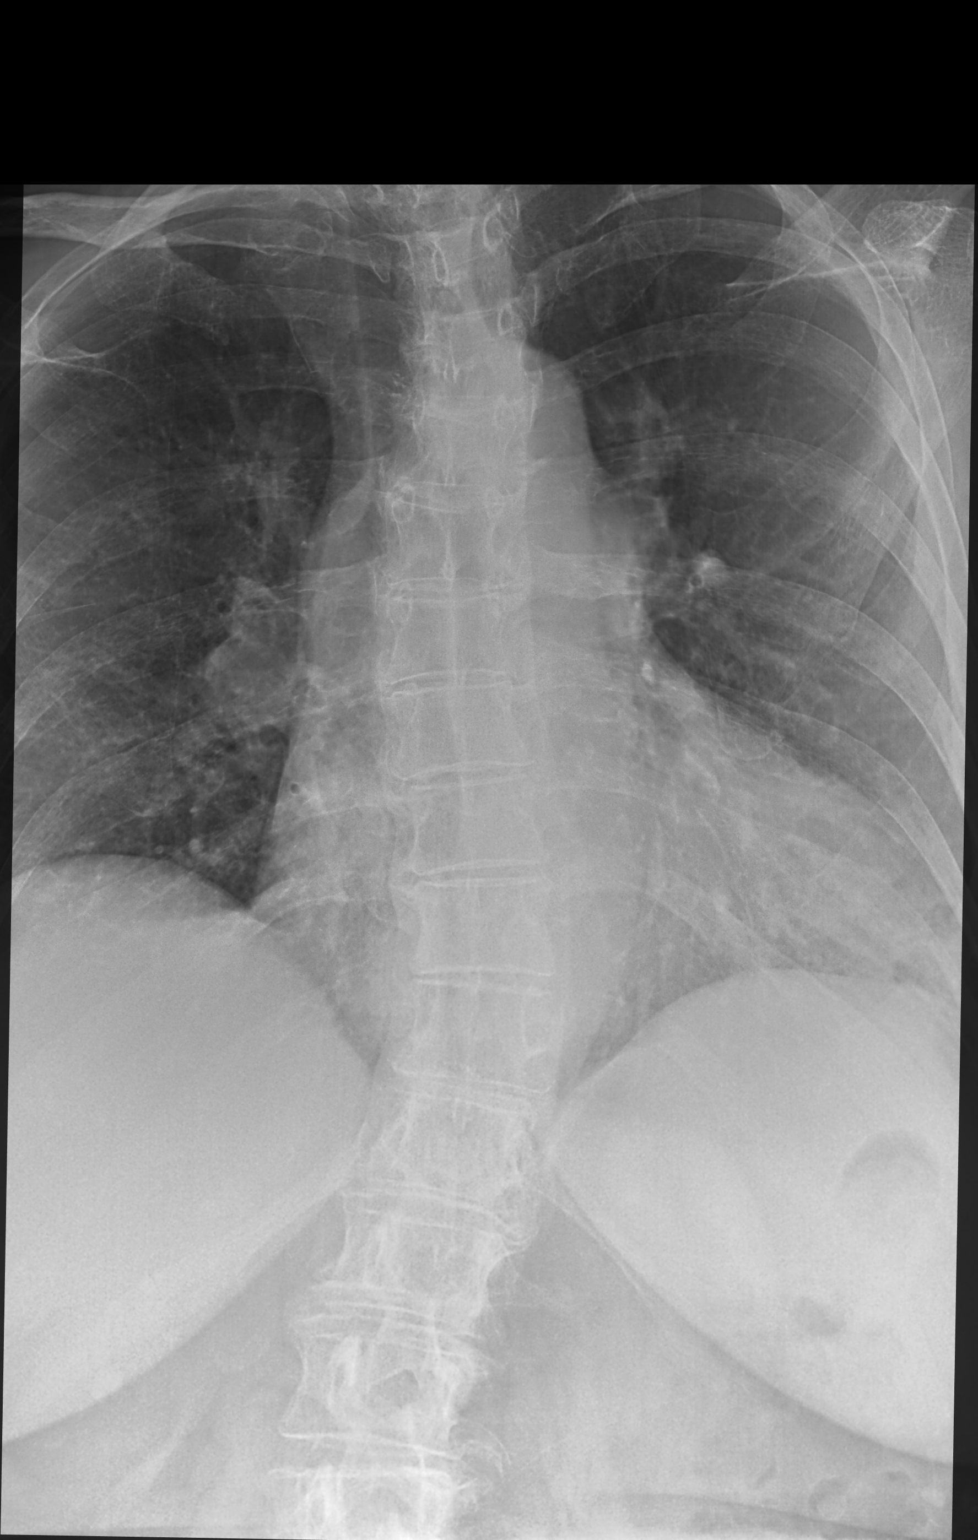

[thoracic spine lat]
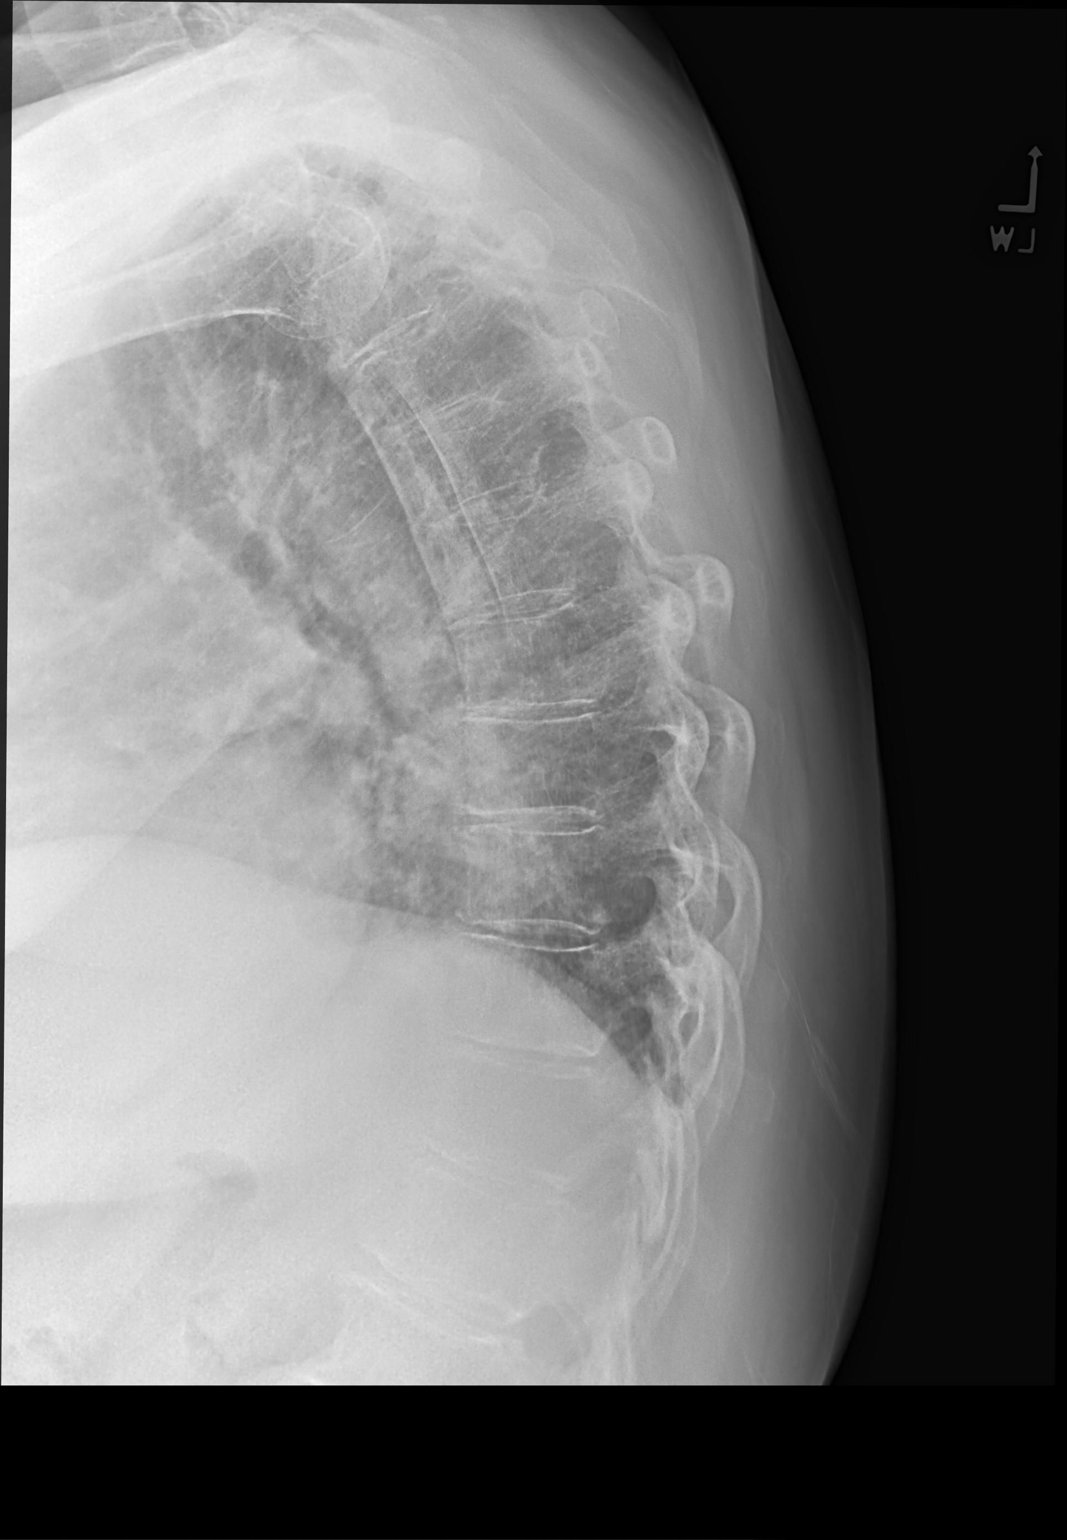

[swimmers lat]
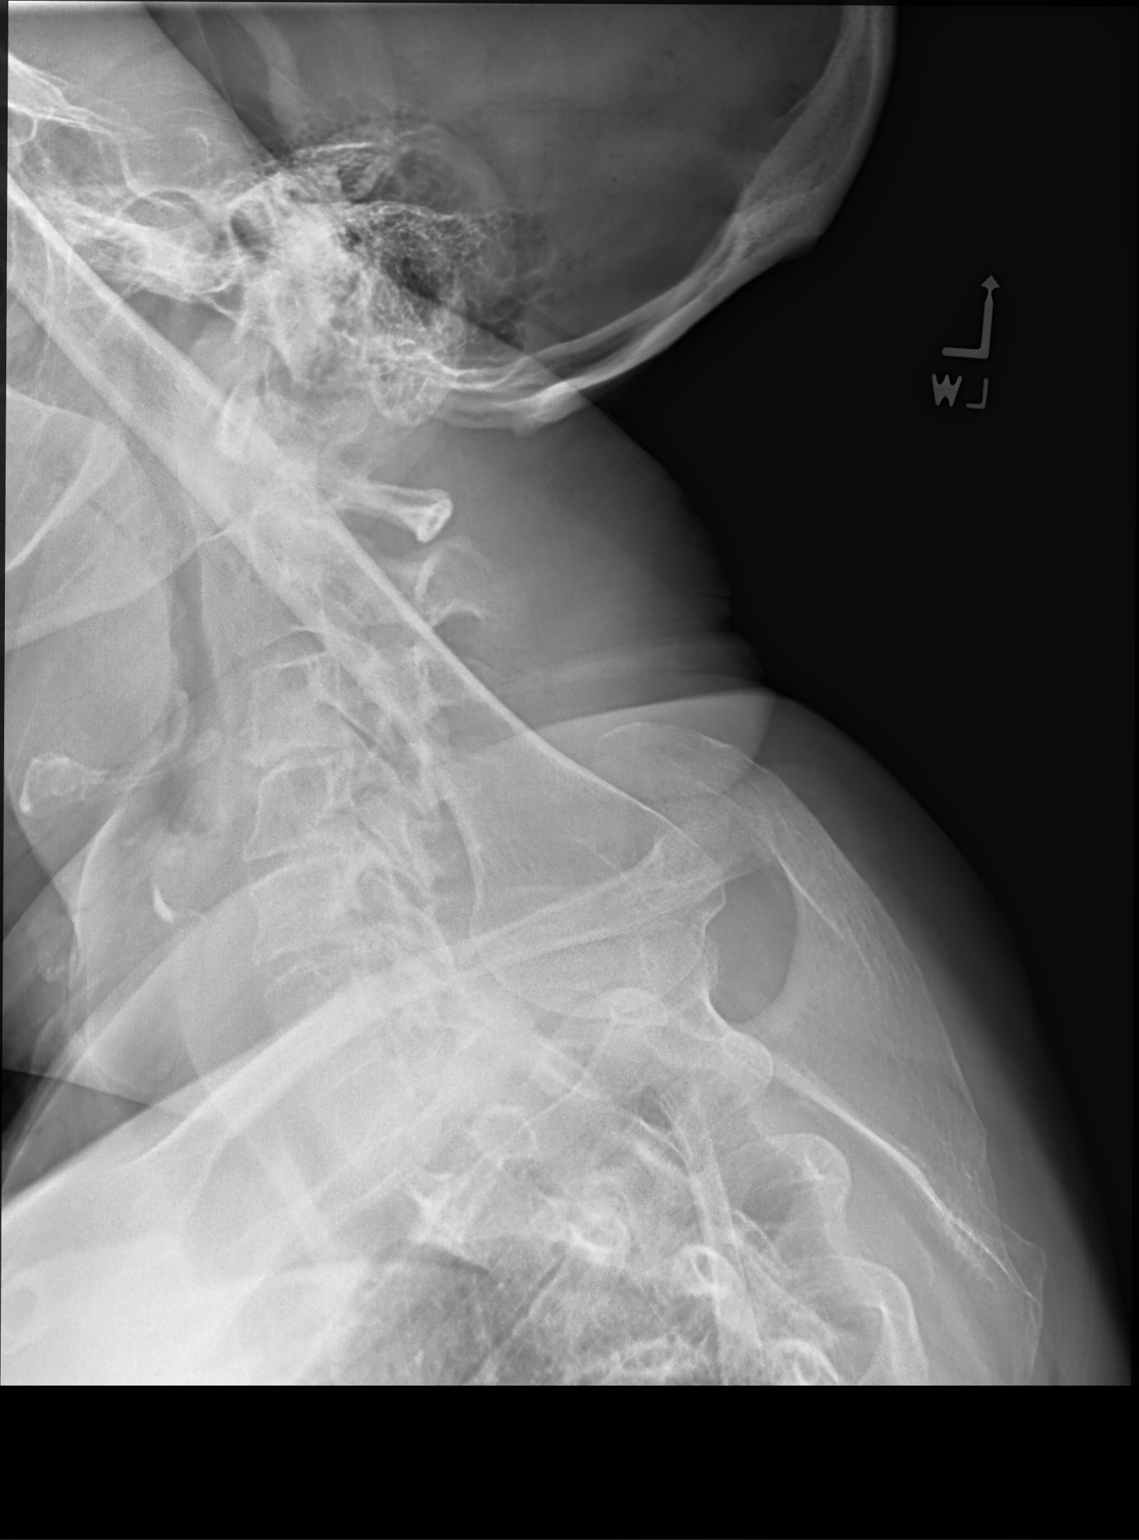

[3 of 3 positions shown; findings below may reference images not displayed]

FINDINGS: Mild S-shaped thoracic scoliosis is again seen. No significant
listhesis is identified in the sagittal plane. No fracture is
identified. There is mild degenerative endplate spurring in the
lower thoracic spine with more notable spurring in the included
portion of the lumbar spine. The bones are diffusely osteopenic.
There is the suggestion of patchy airspace opacity in the basilar
left lower lobe.
IMPRESSION: 1. Thoracic scoliosis without acute osseous abnormality.
2. Possible left lower lobe pulmonary infiltrate. Correlate for
respiratory symptoms and consider further evaluation with dedicated
PA and lateral chest radiographs.

## 2019-12-22 IMAGING — DX DG CHEST 2V
2 series · 2 of 2 positions shown · non-contrast
Comparison: Thoracic spine radiographs, 06/16/2018. Chest CT,
08/26/2010.

CLINICAL DATA: Followup for left infiltrate noted on thoracic spine
radiographs.

EXAM:
CHEST - 2 VIEW

[chest pa]
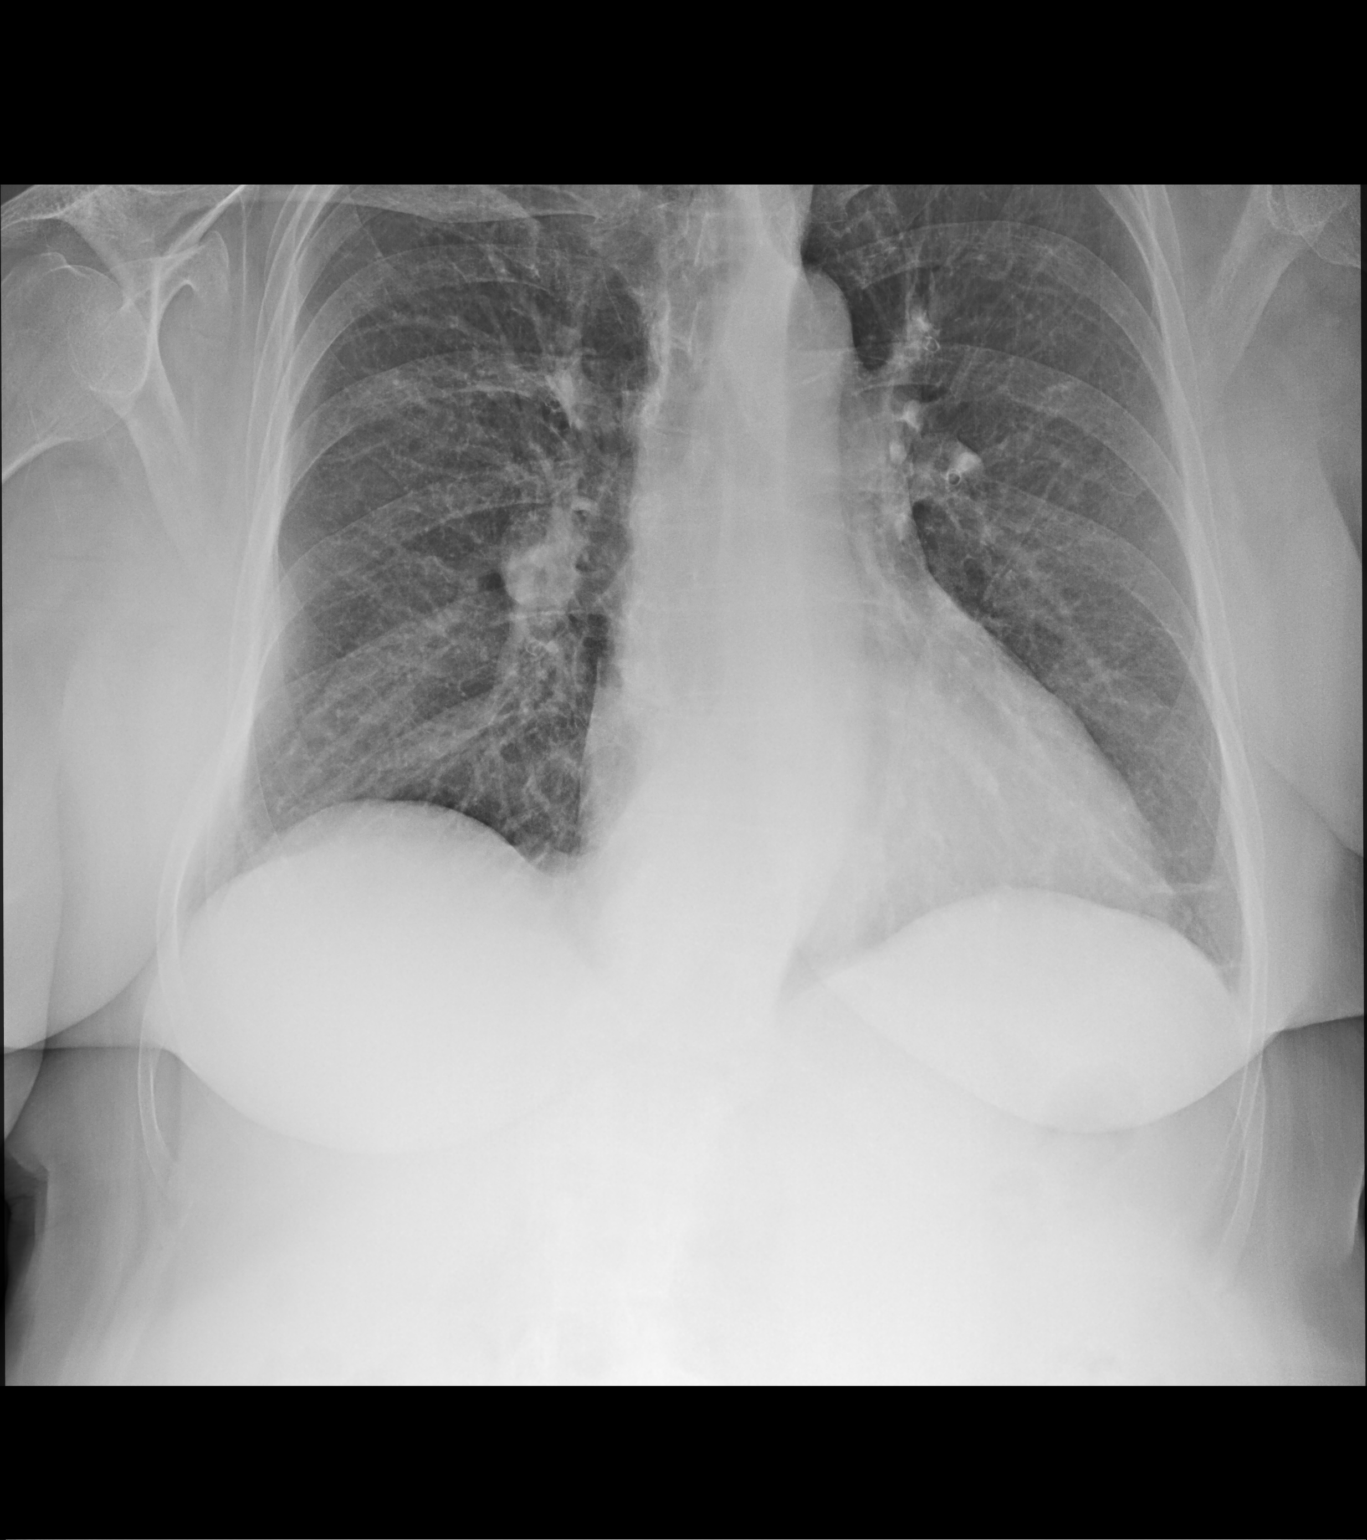

[chest lat]
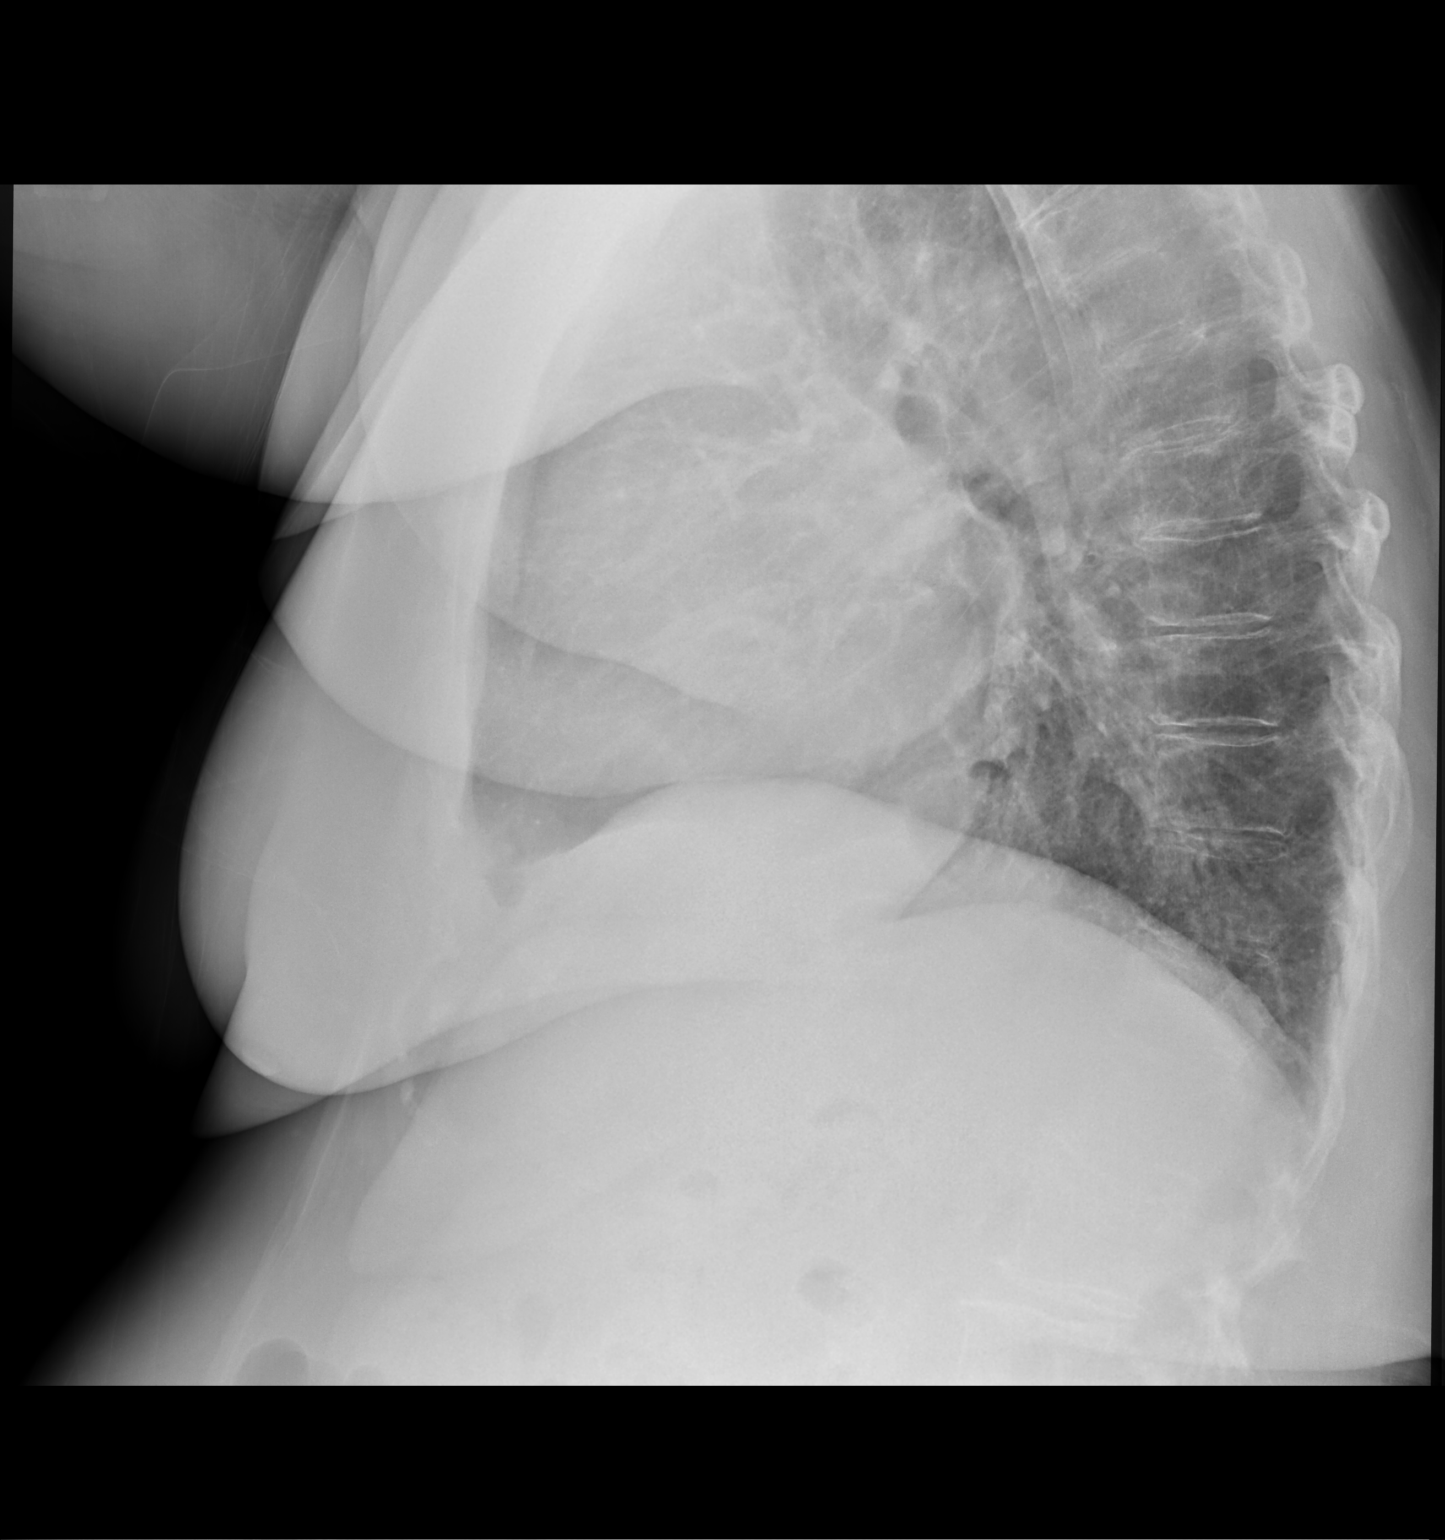

[2 of 2 positions shown; findings below may reference images not displayed]

FINDINGS: Cardiac silhouette mildly enlarged. No mediastinal or hilar masses.
There is no evidence of adenopathy.

Mild linear opacity at the left lateral lung base. Mildly prominent
bronchovascular markings. Lungs otherwise clear. Specifically, there
is no left lung base consolidation.

No pleural effusion or pneumothorax.

Skeletal structures are demineralized but grossly intact.
IMPRESSION: 1. No active cardiopulmonary disease.
2. No evidence of left lung base pneumonia.

## 2020-01-02 ENCOUNTER — Other Ambulatory Visit: Payer: Self-pay | Admitting: Internal Medicine

## 2020-01-26 ENCOUNTER — Other Ambulatory Visit: Payer: Self-pay | Admitting: Internal Medicine

## 2020-02-10 ENCOUNTER — Telehealth: Payer: Self-pay | Admitting: Internal Medicine

## 2020-02-10 NOTE — Telephone Encounter (Signed)
Pt stated that she thinks she has vertigo. She feels ok as long as she is not up moving around. Says she feels like the room is spinning. She is home alone right now and does not feel comfortable driving. Confirmed she is not having any other acute symptoms. Are you ok with her trying meclizine and calling for update next week?

## 2020-02-10 NOTE — Telephone Encounter (Signed)
Patient aware and is going to walk in clinic

## 2020-02-10 NOTE — Telephone Encounter (Signed)
She can try meclizine, but I would still like for her to be evaluated.  Yes, update [place] next week as well.

## 2020-02-10 NOTE — Telephone Encounter (Signed)
Please call and confirm pt doing ok and is going to be evaluated.  Called in with dizziness.  See note.

## 2020-02-10 NOTE — Telephone Encounter (Signed)
transferred to Access Nurse   Pt called stating that she is [place] headed

## 2020-02-10 NOTE — Telephone Encounter (Signed)
Patient was informed by access nurse to go to ED/UC.  [place] Day - [place] RECORD AccessNurse Patient Name: Nina Perez Gender: Female DOB: July 14, 1940 Age: 79 Y 69 M 20 D Return Phone Number: [phone] (Primary), [phone] (Secondary) Address: City/State/Zip: [place] [address] Client [place] Client Site [place] - Day Physician Einar Pheasant - MD Contact Type Call Who Is Calling Patient / Member / Family / Caregiver Call Type Triage / Clinical Relationship To Patient Self Return Phone Number [phone] (Primary) Chief Complaint Dizziness Reason for Call Symptomatic / Request for [place] states she has dizziness. Translation No Nurse Assessment Nurse: May, RN, Tammy Date/Time Eilene Ghazi Time): 02/10/2020 9:41:15 AM Confirm and document reason for call. If symptomatic, describe symptoms. ---Caller states she is having dizziness with head movement. Caller states this started 1 week ago. Does the patient have any new or worsening symptoms? ---Yes Will a triage be completed? ---Yes Related visit to physician within the last 2 weeks? ---No Does the PT have any chronic conditions? (i.e. diabetes, asthma, this includes High risk factors for pregnancy, etc.) ---Yes List chronic conditions. ---dm, htn Is this a behavioral health or substance abuse call? ---No Guidelines Guideline Title Affirmed Question Affirmed Notes Nurse Date/Time (Eastern Time) Dizziness - Vertigo [1] Dizziness (vertigo) present now AND [2] one or more STROKE RISK FACTORS (i.e., hypertension, diabetes, prior stroke/TIA, heart attack) (Exception: prior physician evaluation for this AND no different/ worse than usual) May, RN, Tammy 02/10/2020 9:42:33 AM Disp. Time Eilene Ghazi Time) Disposition Final User 02/10/2020 9:46:36 AM Go to ED Now (or PCP  triage) Yes May, RN, Tammy PLEASE NOTE: All timestamps contained within this report are represented as [place] Standard Time. CONFIDENTIALTY NOTICE: This fax transmission is intended only for the addressee. It contains information that is legally privileged, confidential or otherwise protected from use or disclosure. If you are not the intended recipient, you are strictly prohibited from reviewing, disclosing, copying using or disseminating any of this information or taking any action in reliance on or regarding this information. If you have received this fax in error, please notify us immediately by telephone so that we can arrange for its return to [place]. Phone: [phone], Toll-Free: [phone], Fax: [phone] Page: 2 of 2 Call Id: [id number] [place] Disagree/Comply Comply Caller Understands Yes PreDisposition Did not know what to do Care Advice Given Per Guideline GO TO ED NOW (OR PCP TRIAGE): * IF NO PCP (PRIMARY CARE PROVIDER) SECOND-LEVEL TRIAGE: You need to be seen within the next hour. Go to the [facility] at _____________ [place] as soon as you can. NOTE TO TRIAGER - DRIVING: * Another adult should drive. * If immediate transportation is not available via car or taxi, then the patient should be instructed to call [id number]. CARE ADVICE given per Dizziness - Vertigo (Adult) guideline. Referrals GO TO FACILITY UNDECIDED

## 2020-02-22 ENCOUNTER — Other Ambulatory Visit (INDEPENDENT_AMBULATORY_CARE_PROVIDER_SITE_OTHER): Payer: Self-pay | Admitting: Nurse Practitioner

## 2020-02-22 DIAGNOSIS — I739 Peripheral vascular disease, unspecified: Secondary | ICD-10-CM

## 2020-02-23 ENCOUNTER — Ambulatory Visit (INDEPENDENT_AMBULATORY_CARE_PROVIDER_SITE_OTHER): Payer: Medicare Other | Admitting: Nurse Practitioner

## 2020-02-23 ENCOUNTER — Encounter (INDEPENDENT_AMBULATORY_CARE_PROVIDER_SITE_OTHER): Payer: Medicare Other

## 2020-02-24 ENCOUNTER — Ambulatory Visit (INDEPENDENT_AMBULATORY_CARE_PROVIDER_SITE_OTHER): Payer: Medicare Other | Admitting: Nurse Practitioner

## 2020-02-24 ENCOUNTER — Other Ambulatory Visit: Payer: Self-pay

## 2020-02-24 ENCOUNTER — Encounter (INDEPENDENT_AMBULATORY_CARE_PROVIDER_SITE_OTHER): Payer: Self-pay | Admitting: Nurse Practitioner

## 2020-02-24 ENCOUNTER — Ambulatory Visit (INDEPENDENT_AMBULATORY_CARE_PROVIDER_SITE_OTHER): Payer: Medicare Other

## 2020-02-24 VITALS — BP 125/75 | HR 67 | Resp 16 | Wt 225.0 lb

## 2020-02-24 DIAGNOSIS — I10 Essential (primary) hypertension: Secondary | ICD-10-CM

## 2020-02-24 DIAGNOSIS — E7800 Pure hypercholesterolemia, unspecified: Secondary | ICD-10-CM | POA: Diagnosis not present

## 2020-02-24 DIAGNOSIS — I739 Peripheral vascular disease, unspecified: Secondary | ICD-10-CM

## 2020-02-29 ENCOUNTER — Ambulatory Visit (INDEPENDENT_AMBULATORY_CARE_PROVIDER_SITE_OTHER): Payer: Medicare Other | Admitting: Internal Medicine

## 2020-02-29 ENCOUNTER — Other Ambulatory Visit: Payer: Self-pay

## 2020-02-29 ENCOUNTER — Encounter: Payer: Self-pay | Admitting: Internal Medicine

## 2020-02-29 DIAGNOSIS — N281 Cyst of kidney, acquired: Secondary | ICD-10-CM

## 2020-02-29 DIAGNOSIS — E1159 Type 2 diabetes mellitus with other circulatory complications: Secondary | ICD-10-CM | POA: Diagnosis not present

## 2020-02-29 DIAGNOSIS — M069 Rheumatoid arthritis, unspecified: Secondary | ICD-10-CM

## 2020-02-29 DIAGNOSIS — N1831 Chronic kidney disease, stage 3a: Secondary | ICD-10-CM

## 2020-02-29 DIAGNOSIS — I272 Pulmonary hypertension, unspecified: Secondary | ICD-10-CM

## 2020-02-29 DIAGNOSIS — K2100 Gastro-esophageal reflux disease with esophagitis, without bleeding: Secondary | ICD-10-CM | POA: Diagnosis not present

## 2020-02-29 DIAGNOSIS — E7800 Pure hypercholesterolemia, unspecified: Secondary | ICD-10-CM

## 2020-02-29 DIAGNOSIS — I70249 Atherosclerosis of native arteries of left leg with ulceration of unspecified site: Secondary | ICD-10-CM

## 2020-02-29 DIAGNOSIS — R42 Dizziness and giddiness: Secondary | ICD-10-CM

## 2020-02-29 DIAGNOSIS — I10 Essential (primary) hypertension: Secondary | ICD-10-CM

## 2020-02-29 DIAGNOSIS — I739 Peripheral vascular disease, unspecified: Secondary | ICD-10-CM

## 2020-02-29 NOTE — Progress Notes (Signed)
Patient ID: Nina Perez, female   DOB: 07-Nov-1940, 79 y.o.   MRN: [id number]   Subjective:    Patient ID: Nina Perez, female    DOB: [birth year], 79 y.o.   MRN: [id number]  HPI This visit occurred during the SARS-CoV-2 public health emergency.  Safety protocols were in place, including screening questions prior to the visit, additional usage of staff PPE, and extensive cleaning of exam room while observing appropriate contact time as indicated for disinfecting solutions.  Patient here for a scheduled follow up.  She is accompanied by her husband.  History obtained from both of them.  In a wheel chair.  Uses when she is out. Walks at home with cane.  Right knee bothering her more.  Was seen at [facility] for dizziness.  Diagnosed with vertigo.  Given epley maneuvers and meclizine.  Better now.  Just had f/u with vascular surgery 02/24/20.  Stable.  Recommended continuing to wear compression hose. Saw pulmonary 11/28/19 - f/u DOE due to pulmonary hypertension, crest syndrome, atelectasis and deconditioning.   Wears oxygen at night.  Not very active.  Per pulmonary note, have discussed pulmonary rehab.  Has f/u scheduled with pulmonary.  Feels breathing overall stable.  No chest pain.  No abdominal pain.  Bowels moving.  Saw Dr Bernardo Heater for f/u abnormal renal ultrasound.  Elected f/u renal ultrasound in 6 months.    Past Medical History:  Diagnosis Date  . Barrett's esophagus   . Cancer ([place])    skin ca  . Diabetes mellitus ([place])   . Gout   . History of colon cancer    adenomatous polyps  . Hypercholesterolemia   . Hypertension   . Inflammatory arthritis   . Osteoarthritis    knees, spine  . Phlebitis    x2 (with pregnancy)  . 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 Pulmonary hypertension ([place])   . Reflux esophagitis   . Rheumatoid arthritis(714.0)    positive RF, FANA, RNP, negative CCP ab and anti DNA,.  neg anti-SCL 70  . 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 ABDOMINAL AORTOGRAM W/LOWER EXTREMITY Left 05/16/2019   Procedure: ABDOMINAL AORTOGRAM W/LOWER EXTREMITY;  Surgeon: Lorretta Harp, MD;  Location: [place] [facility];  Service: Cardiovascular;  Laterality: Left;  . AMPUTATION TOE Left 06/02/2019   Procedure: AMPUTATION TOE;  Surgeon: Albertine Patricia, DPM;  Location: [facility];  Service: Podiatry;  Laterality: Left;  . BREAST CYST ASPIRATION Left    neg  . INCISION AND DRAINAGE OF WOUND Left 06/07/2019   Procedure: IRRIGATION AND DEBRIDEMENT WOUND;  Surgeon: Albertine Patricia, DPM;  Location: [facility];  Service: Podiatry;  Laterality: Left;  . LOWER EXTREMITY ANGIOGRAPHY Left 06/03/2019   Procedure: Lower Extremity Angiography;  Surgeon: Katha Cabal, MD;  Location: [place] [facility];  Service: Cardiovascular;  Laterality: Left;  . PERIPHERAL VASCULAR BALLOON ANGIOPLASTY Left 05/16/2019   Procedure: PERIPHERAL VASCULAR BALLOON ANGIOPLASTY;  Surgeon: Lorretta Harp, MD;  Location: [place] [facility];  Service: Cardiovascular;  Laterality: Left;  SFA  . VEIN LIGATION AND STRIPPING     Family History  Problem Relation Age of Onset  . Arthritis Mother   . Heart attack Father   . Colon cancer Neg Hx   . Breast cancer Neg Hx    Social History   Socioeconomic History  . Marital status: Married    Spouse  name: Not on file  . Number of children: Not on file  . Years of education: Not on file  . Highest education level: Not on file  Occupational History  . Not on file  Tobacco Use  . Smoking status: Former Smoker    Packs/day: 0.50    Years: 15.00    Pack years: 7.50    Quit date: 05/12/1988    Years since quitting: 31.8  . Smokeless tobacco: Never Used  Vaping Use  . Vaping Use: Never used  Substance and Sexual Activity  . Alcohol use: No    Alcohol/week: 0.0 standard drinks  . Drug use: No  . Sexual activity: Yes  Other Topics Concern  . Not on file    Social History Narrative  . Not on file   Social Determinants of Health   Financial Resource Strain:   . Difficulty of Paying Living Expenses: Not on file  Food Insecurity:   . Worried About Charity fundraiser in the Last Year: Not on file  . Ran Out of Food in the Last Year: Not on file  Transportation Needs:   . Lack of Transportation (Medical): Not on file  . Lack of Transportation (Non-Medical): Not on file  Physical Activity:   . Days of Exercise per Week: Not on file  . Minutes of Exercise per Session: Not on file  Stress:   . Feeling of Stress : Not on file  Social Connections: Socially Integrated  . Frequency of Communication with Friends and Family: More than three times a week  . Frequency of Social Gatherings with Friends and Family: Not on file  . Attends Religious Services: More than 4 times per year  . Active Member of Clubs or Organizations: Yes  . Attends Archivist Meetings: More than 4 times per year  . Marital Status: Married    Outpatient Encounter Medications as of 02/29/2020  Medication Sig  . acetaminophen (TYLENOL) 650 MG CR tablet Take 650 mg by mouth every 4 (four) hours as needed for pain.   Marland Kitchen allopurinol (ZYLOPRIM) 300 MG tablet Take 1 tablet by mouth once daily  . amLODipine (NORVASC) 10 MG tablet TAKE 1 TABLET BY MOUTH DAILY  . aspirin 81 MG tablet Take 81 mg by mouth daily. Reported on 07/24/2015  . atorvastatin (LIPITOR) 80 MG tablet Take 1 tablet (80 mg total) by mouth daily at 6 PM.  . folic acid (FOLVITE) 1 MG tablet Take 1 mg by mouth every evening.   Marland Kitchen glucose blood (BAYER CONTOUR TEST) test strip USE  STRIP TO CHECK GLUCOSE TWICE DAILY. Dx: E11.9  . losartan (COZAAR) 100 MG tablet Take 1 tablet by mouth once daily  . metFORMIN (GLUCOPHAGE) 500 MG tablet Take 1 tablet by mouth once daily  . methotrexate (RHEUMATREX) 2.5 MG tablet TAKE 4 TABLETS BY MOUTH EVERY 7 DAYS.  Marland Kitchen MICROLET LANCETS MISC Check sugar once daily, [employer]  Microlet Lancets. Dx E11.9  . spironolactone (ALDACTONE) 25 MG tablet Take 1 tablet by mouth once daily  . [DISCONTINUED] cephALEXin (KEFLEX) 500 MG capsule Take 1 capsule (500 mg total) by mouth 2 (two) times daily. (Patient not taking: Reported on 02/24/2020)  . [DISCONTINUED] clopidogrel (PLAVIX) 75 MG tablet Take 1 tablet (75 mg total) by mouth daily with breakfast.  . [DISCONTINUED] gabapentin (NEURONTIN) 100 MG capsule Take 1 capsule (100 mg total) by mouth 3 (three) times daily. (Patient not taking: Reported on 02/24/2020)  . [DISCONTINUED] metoprolol succinate (TOPROL-XL) 50 MG  24 hr tablet TAKE 1 TABLET BY MOUTH ONCE DAILY IMMEDIATELY FOLLOWING A MEAL  . [DISCONTINUED] pantoprazole (PROTONIX) 40 MG tablet Take 1 tablet (40 mg total) by mouth daily.  . [DISCONTINUED] torsemide (DEMADEX) 20 MG tablet Take by mouth.  (Patient not taking: Reported on 11/21/2019)   No facility-administered encounter medications on file as of 02/29/2020.    Review of Systems  Constitutional: Negative for appetite change and unexpected weight change.  HENT: Negative for congestion and sinus pressure.   Respiratory: Negative for cough and chest tightness.        Breathing overall stable.  Some chronic DOE.   Cardiovascular: Negative for chest pain and palpitations.       Leg swelling improved.    Gastrointestinal: Negative for abdominal pain, diarrhea, nausea and vomiting.  Genitourinary: Negative for dysuria and frequency.  Musculoskeletal: Negative for myalgias.       Knee pain as outlined.    Skin: Negative for color change and rash.  Neurological: Negative for headaches.       No dizziness now.  Resolved.   Psychiatric/Behavioral: Negative for agitation and dysphoric mood.       Objective:    Physical Exam Constitutional:      General: She is not in acute distress.    Appearance: Normal appearance.  HENT:     Head: Normocephalic and atraumatic.     Right Ear: External ear normal.     Left  Ear: External ear normal.  Eyes:     General:        Right eye: No discharge.        Left eye: No discharge.     Conjunctiva/sclera: Conjunctivae normal.  Neck:     Thyroid: No thyromegaly.  Cardiovascular:     Rate and Rhythm: Normal rate and regular rhythm.  Pulmonary:     Effort: No respiratory distress.     Breath sounds: Normal breath sounds. No wheezing.  Abdominal:     General: Bowel sounds are normal.     Palpations: Abdomen is soft.     Tenderness: There is no abdominal tenderness.  Musculoskeletal:        General: No tenderness.     Cervical back: Neck supple. No tenderness.     Comments: No increased swelling.   Lymphadenopathy:     Cervical: No cervical adenopathy.  Skin:    Findings: No erythema or rash.  Neurological:     Mental Status: She is alert.  Psychiatric:        Mood and Affect: Mood normal.        Behavior: Behavior normal.     BP 126/76   Pulse 63   Temp 98.2 F (36.8 C) (Oral)   Resp 16   Ht '5\' 6"'  (1.676 m)   Wt 227 lb 3.2 oz (103.1 kg)   LMP 05/25/1988   SpO2 96%   BMI 36.67 kg/m  Wt Readings from Last 3 Encounters:  02/29/20 227 lb 3.2 oz (103.1 kg)  02/24/20 225 lb (102.1 kg)  12/01/19 (!) 222 lb (100.7 kg)     Lab Results  Component Value Date   WBC 3.6 (L) 03/09/2020   HGB 12.6 03/09/2020   HCT 38.4 03/09/2020   PLT 94.0 (L) 03/09/2020   GLUCOSE 91 03/09/2020   CHOL 94 03/09/2020   TRIG 111.0 03/09/2020   HDL 34.20 (L) 03/09/2020   LDLCALC 38 03/09/2020   ALT 17 03/09/2020   AST 20 03/09/2020   NA 139  03/09/2020   K 4.0 03/09/2020   CL 105 03/09/2020   CREATININE 1.26 (H) 03/09/2020   BUN 18 03/09/2020   CO2 26 03/09/2020   TSH 4.44 11/18/2019   INR 1.1 06/02/2019   HGBA1C 6.1 03/09/2020   MICROALBUR 7.6 (H) 02/10/2019    US Renal  Result Date: 11/04/2019 CLINICAL DATA:  Renal insufficiency. EXAM: RENAL / URINARY TRACT ULTRASOUND COMPLETE COMPARISON:  Abdominal ultrasound on 04/26/2018 FINDINGS: Right Kidney:  Renal measurements: 10.2 x 4.3 x 5.0 cm = volume: 114 mL. Similar appearance right kidney compared to prior ultrasound. No hydronephrosis or focal lesion. Left Kidney: Renal measurements: 11.2 x 5.0 x 4.5 cm = volume: 132 mL. No hydronephrosis. Subtle area of increased echogenicity within the deep cortex of the lower pole near the corticomedullary junction measures roughly 1.3 cm. This may represent simply some asymmetric prominence of fat but a subtle lesion is not excluded. On the prior study this probably was present. Bladder: Appears normal for degree of bladder distention. Other: None. IMPRESSION: No evidence of renal obstruction or significant progressive renal atrophy since 2019. Subtle area of increased echogenicity at the corticomedullary junction of the lower pole of the left kidney measures approximately 1.3 cm. This appears to probably have been present when reviewing the prior ultrasound images but on the imaging appears slightly more conspicuous. Subtle renal lesion is not excluded. Given the history of renal insufficiency, contrast enhanced exams may be currently unable to be performed. Consider follow-up with either renal ultrasound or unenhanced MRI of the abdomen. Electronically Signed   By: Aletta Edouard M.D.   On: 11/04/2019 15:21       Assessment & Plan:   Problem List Items Addressed This Visit    Rheumatoid arthritis ([place])    Followed by Dr Jefm Bryant.  On MTX.  Increased pain - knees.  Has appt next week.       Renal cyst    Saw Dr Bernardo Heater 11/2019.  Recommended f/u renal ultrasound in 6 months.        Pulmonary hypertension, mild ([place])    Has f/u with pulmonary.  Per note, wears oxygen at night.  Follow.       PAD (peripheral artery disease) (HCC)    S/p angiogram, angioplasty and toe amputation.  Restart statin medication.  Continue plavix.  Keep f/u with vascular surgery.       Hypertension    Blood pressure under good control.  Continue metoprolol, losartan,  spironolactome and amlodipine.  Follow pressures. Follow metabolic panel.        Hypercholesterolemia    Reports has been off her cholesterol medication.  Discussed importance of taking, especially given her underlying vascular disease.  Will restart lipitor.       GERD (gastroesophageal reflux disease)    No acid reflux symptoms.  No protonix.  Follow.       Dizziness    Recently evaluated at [facility].  Resolved now.  Follow.       Diabetes mellitus ([place])    Low carb diet and exercise.  On metformin.  Follow met b and a1c.  Sugars have been stable.   Lab Results  Component Value Date   HGBA1C 6.1 03/09/2020        CKD (chronic kidney disease) stage 3, GFR 30-59 ml/min (HCC)    Avoid antiinflammatories.  Continue good blood pressure control.  Follow renal function.           Einar Pheasant, MD

## 2020-03-01 ENCOUNTER — Encounter (INDEPENDENT_AMBULATORY_CARE_PROVIDER_SITE_OTHER): Payer: Self-pay | Admitting: Nurse Practitioner

## 2020-03-01 NOTE — Progress Notes (Signed)
Subjective:    Patient ID: Nina Perez, female    DOB: Jul 02, 1940, 79 y.o.   MRN: [id number] Chief Complaint  Patient presents with  . Follow-up    ultrasound follow up    The patient returns to the office for followup and review of the noninvasive studies. There have been no interval changes in lower extremity symptoms. No interval shortening of the patient's claudication distance or development of rest pain symptoms. No new ulcers or wounds have occurred since the last visit.  There have been no significant changes to the patient's overall health care.  The patient denies amaurosis fugax or recent TIA symptoms. There are no recent neurological changes noted. The patient denies history of DVT, PE or superficial thrombophlebitis. The patient denies recent episodes of angina or shortness of breath.   ABI Rt=1.03and Lt=11.04  (previous ABI's Rt=.84 and Lt=1.01) Duplex ultrasound of the right tibial arteries reveals biphasic waveforms with triphasic tibial artery waveforms in the left.  The patient has strong toe waveforms bilaterally.   Review of Systems  Cardiovascular: Positive for leg swelling.       Claudication  All other systems reviewed and are negative.      Objective:   Physical Exam Vitals reviewed.  Cardiovascular:     Rate and Rhythm: Normal rate.     Pulses: Normal pulses.  Pulmonary:     Effort: Pulmonary effort is normal.  Musculoskeletal:     Right lower leg: Edema present.  Neurological:     Mental Status: She is alert and oriented to person, place, and time.  Psychiatric:        Mood and Affect: Mood normal.        Behavior: Behavior normal.        Thought Content: Thought content normal.        Judgment: Judgment normal.     BP 125/75 (BP Location: Right Arm)   Pulse 67   Resp 16   Wt 225 lb (102.1 kg)   LMP 05/25/1988   BMI 36.32 kg/m   Past Medical History:  Diagnosis Date  . Barrett's esophagus   . Cancer ([place])    skin ca  . Diabetes  mellitus ([place])   . Gout   . History of colon cancer    adenomatous polyps  . Hypercholesterolemia   . Hypertension   . Inflammatory arthritis   . Osteoarthritis    knees, spine  . Phlebitis    x2 (with pregnancy)  . 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 Pulmonary hypertension ([place])   . Reflux esophagitis   . Rheumatoid arthritis(714.0)    positive RF, FANA, RNP, negative CCP ab and anti DNA,.  neg anti-SCL 70  . 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 Marital status: Married    Spouse name: Not on file  . Number of children: Not on file  . Years of education: Not on file  . Highest education level: Not on file  Occupational History  . Not on file  Tobacco Use  . Smoking status: Former Smoker    Packs/day: 0.50    Years: 15.00    Pack years: 7.50    Quit date: 05/12/1988    Years since quitting: 31.8  . Smokeless tobacco: Never Used  Vaping Use  . Vaping Use: Never used  Substance and Sexual Activity  . Alcohol use: No    Alcohol/week: 0.0  standard drinks  . Drug use: No  . Sexual activity: Yes  Other Topics Concern  . Not on file  Social History Narrative  . Not on file   Social Determinants of Health   Financial Resource Strain:   . Difficulty of Paying Living Expenses: Not on file  Food Insecurity:   . Worried About Charity fundraiser in the Last Year: Not on file  . Ran Out of Food in the Last Year: Not on file  Transportation Needs:   . Lack of Transportation (Medical): Not on file  . Lack of Transportation (Non-Medical): Not on file  Physical Activity:   . Days of Exercise per Week: Not on file  . Minutes of Exercise per Session: Not on file  Stress:   . Feeling of Stress : Not on file  Social Connections: Socially Integrated  . Frequency of Communication with Friends and Family: More than three times a week  . Frequency of Social Gatherings with Friends and Family: Not on file    . Attends Religious Services: More than 4 times per year  . Active Member of Clubs or Organizations: Yes  . Attends Archivist Meetings: More than 4 times per year  . Marital Status: Married  Human resources officer Violence:   . Fear of Current or Ex-Partner: Not on file  . Emotionally Abused: Not on file  . Physically Abused: Not on file  . Sexually Abused: Not on file    Past Surgical History:  Procedure Laterality Date  . ABDOMINAL AORTOGRAM W/LOWER EXTREMITY Left 05/16/2019   Procedure: ABDOMINAL AORTOGRAM W/LOWER EXTREMITY;  Surgeon: Lorretta Harp, MD;  Location: [place] [facility];  Service: Cardiovascular;  Laterality: Left;  . AMPUTATION TOE Left 06/02/2019   Procedure: AMPUTATION TOE;  Surgeon: Albertine Patricia, DPM;  Location: [facility];  Service: Podiatry;  Laterality: Left;  . BREAST CYST ASPIRATION Left    neg  . INCISION AND DRAINAGE OF WOUND Left 06/07/2019   Procedure: IRRIGATION AND DEBRIDEMENT WOUND;  Surgeon: Albertine Patricia, DPM;  Location: [facility];  Service: Podiatry;  Laterality: Left;  . LOWER EXTREMITY ANGIOGRAPHY Left 06/03/2019   Procedure: Lower Extremity Angiography;  Surgeon: Katha Cabal, MD;  Location: [place] [facility];  Service: Cardiovascular;  Laterality: Left;  . PERIPHERAL VASCULAR BALLOON ANGIOPLASTY Left 05/16/2019   Procedure: PERIPHERAL VASCULAR BALLOON ANGIOPLASTY;  Surgeon: Lorretta Harp, MD;  Location: [place] [facility];  Service: Cardiovascular;  Laterality: Left;  SFA  . VEIN LIGATION AND STRIPPING      Family History  Problem Relation Age of Onset  . Arthritis Mother   . Heart attack Father   . Colon cancer Neg Hx   . Breast cancer Neg Hx     Allergies  Allergen Reactions  . Maxzide [Triamterene-Hctz] Other (See Comments)    Weakness/fatigue        Assessment & Plan:   1. PAD (peripheral artery disease) (HCC)  Recommend:  The patient has evidence of atherosclerosis of the lower extremities with  claudication.  The patient does not voice lifestyle limiting changes at this point in time.  Noninvasive studies do not suggest clinically significant change.  No invasive studies, angiography or surgery at this time The patient should continue walking and begin a more formal exercise program.  The patient should continue antiplatelet therapy and aggressive treatment of the lipid abnormalities  No changes in the patient's medications at this time  The patient should continue wearing graduated compression  socks 10-15 mmHg strength to control the mild edema.    2. Primary hypertension Continue antihypertensive medications as already ordered, these medications have been reviewed and there are no changes at this time.   3. Hypercholesterolemia Continue statin as ordered and reviewed, no changes at this time    Current Outpatient Medications on File Prior to Visit  Medication Sig Dispense Refill  . acetaminophen (TYLENOL) 650 MG CR tablet Take 650 mg by mouth every 4 (four) hours as needed for pain.     Marland Kitchen allopurinol (ZYLOPRIM) 300 MG tablet Take 1 tablet by mouth once daily 90 tablet 0  . amLODipine (NORVASC) 10 MG tablet TAKE 1 TABLET BY MOUTH DAILY 90 tablet 3  . aspirin 81 MG tablet Take 81 mg by mouth daily. Reported on 07/24/2015    . atorvastatin (LIPITOR) 80 MG tablet Take 1 tablet (80 mg total) by mouth daily at 6 PM. 90 tablet 1  . clopidogrel (PLAVIX) 75 MG tablet Take 1 tablet (75 mg total) by mouth daily with breakfast. 90 tablet 2  . folic acid (FOLVITE) 1 MG tablet Take 1 mg by mouth every evening.     Marland Kitchen glucose blood (BAYER CONTOUR TEST) test strip USE  STRIP TO CHECK GLUCOSE TWICE DAILY. Dx: E11.9 100 each 12  . losartan (COZAAR) 100 MG tablet Take 1 tablet by mouth once daily 90 tablet 0  . metFORMIN (GLUCOPHAGE) 500 MG tablet Take 1 tablet by mouth once daily 90 tablet 0  . methotrexate (RHEUMATREX) 2.5 MG tablet TAKE 4 TABLETS BY MOUTH EVERY 7 DAYS.    Marland Kitchen metoprolol  succinate (TOPROL-XL) 50 MG 24 hr tablet TAKE 1 TABLET BY MOUTH ONCE DAILY IMMEDIATELY FOLLOWING A MEAL 90 tablet 0  . MICROLET LANCETS MISC Check sugar once daily, [employer] Microlet Lancets. Dx E11.9 100 each 12  . pantoprazole (PROTONIX) 40 MG tablet Take 1 tablet (40 mg total) by mouth daily. 90 tablet 0  . spironolactone (ALDACTONE) 25 MG tablet Take 1 tablet by mouth once daily 90 tablet 0   No current facility-administered medications on file prior to visit.    There are no Patient Instructions on file for this visit. No follow-ups on file.   Kris Hartmann, NP

## 2020-03-05 ENCOUNTER — Other Ambulatory Visit: Payer: Self-pay | Admitting: Internal Medicine

## 2020-03-05 ENCOUNTER — Other Ambulatory Visit: Payer: Self-pay | Admitting: Family Medicine

## 2020-03-05 ENCOUNTER — Other Ambulatory Visit: Payer: Self-pay | Admitting: Cardiology

## 2020-03-05 NOTE — Telephone Encounter (Signed)
This is Dr. Berry's pt 

## 2020-03-09 ENCOUNTER — Other Ambulatory Visit (INDEPENDENT_AMBULATORY_CARE_PROVIDER_SITE_OTHER): Payer: Medicare Other

## 2020-03-09 ENCOUNTER — Other Ambulatory Visit: Payer: Self-pay

## 2020-03-09 DIAGNOSIS — E119 Type 2 diabetes mellitus without complications: Secondary | ICD-10-CM

## 2020-03-09 DIAGNOSIS — I10 Essential (primary) hypertension: Secondary | ICD-10-CM

## 2020-03-09 DIAGNOSIS — E7800 Pure hypercholesterolemia, unspecified: Secondary | ICD-10-CM | POA: Diagnosis not present

## 2020-03-09 LAB — CBC WITH DIFFERENTIAL/PLATELET
Basophils Absolute: 0 10*3/uL (ref 0.0–0.1)
Basophils Relative: 0.6 % (ref 0.0–3.0)
Eosinophils Absolute: 0.1 10*3/uL (ref 0.0–0.7)
Eosinophils Relative: 1.5 % (ref 0.0–5.0)
HCT: 38.4 % (ref 36.0–46.0)
Hemoglobin: 12.6 g/dL (ref 12.0–15.0)
Lymphocytes Relative: 23.3 % (ref 12.0–46.0)
Lymphs Abs: 0.8 10*3/uL (ref 0.7–4.0)
MCHC: 32.7 g/dL (ref 30.0–36.0)
MCV: 101.2 fl — ABNORMAL HIGH (ref 78.0–100.0)
Monocytes Absolute: 0.5 10*3/uL (ref 0.1–1.0)
Monocytes Relative: 13.2 % — ABNORMAL HIGH (ref 3.0–12.0)
Neutro Abs: 2.2 10*3/uL (ref 1.4–7.7)
Neutrophils Relative %: 61.4 % (ref 43.0–77.0)
Platelets: 94 10*3/uL — ABNORMAL LOW (ref 150.0–400.0)
RBC: 3.79 Mil/uL — ABNORMAL LOW (ref 3.87–5.11)
RDW: 19.6 % — ABNORMAL HIGH (ref 11.5–15.5)
WBC: 3.6 10*3/uL — ABNORMAL LOW (ref 4.0–10.5)

## 2020-03-09 LAB — HEPATIC FUNCTION PANEL
ALT: 17 U/L (ref 0–35)
AST: 20 U/L (ref 0–37)
Albumin: 3.8 g/dL (ref 3.5–5.2)
Alkaline Phosphatase: 76 U/L (ref 39–117)
Bilirubin, Direct: 0.1 mg/dL (ref 0.0–0.3)
Total Bilirubin: 0.7 mg/dL (ref 0.2–1.2)
Total Protein: 7.1 g/dL (ref 6.0–8.3)

## 2020-03-09 LAB — LIPID PANEL
Cholesterol: 94 mg/dL (ref 0–200)
HDL: 34.2 mg/dL — ABNORMAL LOW (ref >39.00)
LDL Cholesterol: 38 mg/dL (ref 0–99)
NonHDL: 59.87
Total CHOL/HDL Ratio: 3
Triglycerides: 111 mg/dL (ref 0.0–149.0)
VLDL: 22.2 mg/dL (ref 0.0–40.0)

## 2020-03-09 LAB — BASIC METABOLIC PANEL
BUN: 18 mg/dL (ref 6–23)
CO2: 26 mEq/L (ref 19–32)
Calcium: 9.6 mg/dL (ref 8.4–10.5)
Chloride: 105 mEq/L (ref 96–112)
Creatinine, Ser: 1.26 mg/dL — ABNORMAL HIGH (ref 0.40–1.20)
GFR: 40.81 mL/min — ABNORMAL LOW (ref >60.00)
Glucose, Bld: 91 mg/dL (ref 70–99)
Potassium: 4 mEq/L (ref 3.5–5.1)
Sodium: 139 mEq/L (ref 135–145)

## 2020-03-09 LAB — HEMOGLOBIN A1C: Hgb A1c MFr Bld: 6.1 % (ref 4.6–6.5)

## 2020-03-10 ENCOUNTER — Other Ambulatory Visit: Payer: Self-pay | Admitting: Internal Medicine

## 2020-03-10 DIAGNOSIS — R944 Abnormal results of kidney function studies: Secondary | ICD-10-CM

## 2020-03-10 DIAGNOSIS — D696 Thrombocytopenia, unspecified: Secondary | ICD-10-CM

## 2020-03-10 NOTE — Progress Notes (Signed)
Order placed for f/u labs.  

## 2020-03-11 ENCOUNTER — Encounter: Payer: Self-pay | Admitting: Internal Medicine

## 2020-03-11 NOTE — Assessment & Plan Note (Signed)
Followed by Dr Jefm Bryant.  On MTX.  Increased pain - knees.  Has appt next week.

## 2020-03-11 NOTE — Assessment & Plan Note (Signed)
No acid reflux symptoms.  No protonix.  Follow.

## 2020-03-11 NOTE — Assessment & Plan Note (Signed)
Avoid antiinflammatories.  Continue good blood pressure control.  Follow renal function.

## 2020-03-11 NOTE — Assessment & Plan Note (Signed)
Reports has been off her cholesterol medication.  Discussed importance of taking, especially given her underlying vascular disease.  Will restart lipitor.

## 2020-03-11 NOTE — Assessment & Plan Note (Signed)
S/p angiogram, angioplasty and toe amputation.  Restart statin medication.  Continue plavix.  Keep f/u with vascular surgery.

## 2020-03-11 NOTE — Assessment & Plan Note (Addendum)
Blood pressure under good control.  Continue metoprolol, losartan, spironolactome and amlodipine.  Follow pressures. Follow metabolic panel.

## 2020-03-11 NOTE — Assessment & Plan Note (Signed)
Has f/u with pulmonary.  Per note, wears oxygen at night.  Follow.

## 2020-03-11 NOTE — Assessment & Plan Note (Signed)
Saw Dr Bernardo Heater 11/2019.  Recommended f/u renal ultrasound in 6 months.

## 2020-03-11 NOTE — Assessment & Plan Note (Signed)
Low carb diet and exercise.  On metformin.  Follow met b and a1c.  Sugars have been stable.   Lab Results  Component Value Date   HGBA1C 6.1 03/09/2020

## 2020-03-11 NOTE — Assessment & Plan Note (Signed)
Recently evaluated at [facility].  Resolved now.  Follow.

## 2020-03-12 ENCOUNTER — Telehealth: Payer: Self-pay | Admitting: *Deleted

## 2020-03-12 NOTE — Telephone Encounter (Signed)
See result note.  

## 2020-03-12 NOTE — Telephone Encounter (Signed)
-----   Message from Einar Pheasant, MD sent at 03/10/2020  7:51 PM EDT ----- Notify pt that her hgb is wnl.  White blood cell count and platelet count have decreased.  Need to recheck lab soon - to confirm stable/improved.  Overall sugar control ok - a1c 6.1.  kidney function decreased.  Confirm not taking any antiinflammatories.  Cholesterol low.  Confirm if she is on lipitor 80mg  q day.  If so, then decrease to 1/2 tablet q day.  Liver function tests are wnl.  Recheck non fasting lab in one week to recheck cbc and kidney function.

## 2020-03-12 NOTE — Telephone Encounter (Signed)
Left message to return call to office.

## 2020-03-12 NOTE — Telephone Encounter (Signed)
Patient was returning call about labs 

## 2020-03-20 ENCOUNTER — Other Ambulatory Visit (INDEPENDENT_AMBULATORY_CARE_PROVIDER_SITE_OTHER): Payer: Medicare Other

## 2020-03-20 ENCOUNTER — Other Ambulatory Visit: Payer: Self-pay

## 2020-03-20 ENCOUNTER — Telehealth: Payer: Self-pay | Admitting: Internal Medicine

## 2020-03-20 DIAGNOSIS — D696 Thrombocytopenia, unspecified: Secondary | ICD-10-CM | POA: Diagnosis not present

## 2020-03-20 DIAGNOSIS — R944 Abnormal results of kidney function studies: Secondary | ICD-10-CM

## 2020-03-20 LAB — CBC WITH DIFFERENTIAL/PLATELET
Basophils Absolute: 0.1 10*3/uL (ref 0.0–0.1)
Basophils Relative: 1 % (ref 0.0–3.0)
Eosinophils Absolute: 0.1 10*3/uL (ref 0.0–0.7)
Eosinophils Relative: 1.4 % (ref 0.0–5.0)
HCT: 37.7 % (ref 36.0–46.0)
Hemoglobin: 12.1 g/dL (ref 12.0–15.0)
Lymphocytes Relative: 20 % (ref 12.0–46.0)
Lymphs Abs: 1.1 10*3/uL (ref 0.7–4.0)
MCHC: 32.1 g/dL (ref 30.0–36.0)
MCV: 101.8 fl — ABNORMAL HIGH (ref 78.0–100.0)
Monocytes Absolute: 0.6 10*3/uL (ref 0.1–1.0)
Monocytes Relative: 10.4 % (ref 3.0–12.0)
Neutro Abs: 3.7 10*3/uL (ref 1.4–7.7)
Neutrophils Relative %: 67.2 % (ref 43.0–77.0)
Platelets: 145 10*3/uL — ABNORMAL LOW (ref 150.0–400.0)
RBC: 3.7 Mil/uL — ABNORMAL LOW (ref 3.87–5.11)
RDW: 20.1 % — ABNORMAL HIGH (ref 11.5–15.5)
WBC: 5.4 10*3/uL (ref 4.0–10.5)

## 2020-03-20 LAB — BASIC METABOLIC PANEL
BUN: 22 mg/dL (ref 6–23)
CO2: 27 mEq/L (ref 19–32)
Calcium: 9.8 mg/dL (ref 8.4–10.5)
Chloride: 104 mEq/L (ref 96–112)
Creatinine, Ser: 1.18 mg/dL (ref 0.40–1.20)
GFR: 44.14 mL/min — ABNORMAL LOW (ref >60.00)
Glucose, Bld: 97 mg/dL (ref 70–99)
Potassium: 4.7 mEq/L (ref 3.5–5.1)
Sodium: 137 mEq/L (ref 135–145)

## 2020-03-20 LAB — VITAMIN B12: Vitamin B-12: 317 pg/mL (ref 211–911)

## 2020-03-20 MED ORDER — ATORVASTATIN CALCIUM 40 MG PO TABS
40.0000 mg | ORAL_TABLET | Freq: Every day | ORAL | 1 refills | Status: DC
Start: 2020-03-20 — End: 2020-10-15

## 2020-03-20 NOTE — Telephone Encounter (Signed)
Rx sent in

## 2020-03-20 NOTE — Telephone Encounter (Signed)
Patient called in stated that Dr.Scott wanted her to lower her dosage of Cholestrol (lipitor medication to 40mg  she need a new prescription for this she would like it called in to the [facility] in [place].

## 2020-03-26 ENCOUNTER — Other Ambulatory Visit: Payer: Self-pay | Admitting: Internal Medicine

## 2020-04-13 ENCOUNTER — Other Ambulatory Visit: Payer: Self-pay | Admitting: Internal Medicine

## 2020-04-30 ENCOUNTER — Other Ambulatory Visit: Payer: Self-pay | Admitting: Internal Medicine

## 2020-05-23 ENCOUNTER — Other Ambulatory Visit: Payer: Self-pay

## 2020-05-23 ENCOUNTER — Ambulatory Visit
Admission: RE | Admit: 2020-05-23 | Discharge: 2020-05-23 | Disposition: A | Discharge status: Home / Self Care | Payer: Medicare Other | Source: Ambulatory Visit | Attending: Urology | Admitting: Urology

## 2020-05-23 DIAGNOSIS — N2889 Other specified disorders of kidney and ureter: Secondary | ICD-10-CM | POA: Diagnosis not present

## 2020-05-28 ENCOUNTER — Ambulatory Visit: Payer: Self-pay | Admitting: Urology

## 2020-06-04 ENCOUNTER — Other Ambulatory Visit: Payer: Self-pay | Admitting: Family Medicine

## 2020-06-04 ENCOUNTER — Ambulatory Visit: Payer: Self-pay | Admitting: Urology

## 2020-06-11 ENCOUNTER — Other Ambulatory Visit: Payer: Self-pay | Admitting: Internal Medicine

## 2020-06-14 ENCOUNTER — Encounter: Payer: Self-pay | Admitting: Urology

## 2020-06-14 ENCOUNTER — Ambulatory Visit (INDEPENDENT_AMBULATORY_CARE_PROVIDER_SITE_OTHER): Payer: Medicare Other | Admitting: Urology

## 2020-06-14 ENCOUNTER — Other Ambulatory Visit: Payer: Self-pay

## 2020-06-14 VITALS — BP 135/78 | HR 69 | Ht 66.0 in | Wt 220.0 lb

## 2020-06-14 DIAGNOSIS — N2889 Other specified disorders of kidney and ureter: Secondary | ICD-10-CM | POA: Diagnosis not present

## 2020-06-14 NOTE — Progress Notes (Signed)
06/14/2020 3:00 PM   Nina Perez 1941-05-11 [id number]  Referring provider: Einar Pheasant, MD [address] [place],  [place] [address]  Chief Complaint  Patient presents with  . Results    HPI: 80 y.o. female presents for semiannual follow-up.   Initially seen 11/21/2019 for renal ultrasound showing subtle area of increased echogenicity of the left lower pole measuring 1.3 cm  64-month follow-up renal ultrasound was recommended  No complaints today  Renal ultrasound performed 05/23/2020 showed stable subdural left lower pole hyperechoic mass measuring 1.4 x 1.5 x 1.6 cm   PMH: Past Medical History:  Diagnosis Date  . Barrett's esophagus   . Cancer ([place])    skin ca  . Diabetes mellitus ([place])   . Gout   . History of colon cancer    adenomatous polyps  . Hypercholesterolemia   . Hypertension   . Inflammatory arthritis   . Osteoarthritis    knees, spine  . Phlebitis    x2 (with pregnancy)  . 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 Pulmonary hypertension ([place])   . Reflux esophagitis   . Rheumatoid arthritis(714.0)    positive RF, FANA, RNP, negative CCP ab and anti DNA,.  neg anti-SCL 70  . 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 ABDOMINAL AORTOGRAM W/LOWER EXTREMITY Left 05/16/2019   Procedure: ABDOMINAL AORTOGRAM W/LOWER EXTREMITY;  Surgeon: Lorretta Harp, MD;  Location: [facility];  Service: Cardiovascular;  Laterality: Left;  . AMPUTATION TOE Left 06/02/2019   Procedure: AMPUTATION TOE;  Surgeon: Albertine Patricia, DPM;  Location: [facility];  Service: Podiatry;  Laterality: Left;  . BREAST CYST ASPIRATION Left    neg  . INCISION AND DRAINAGE OF WOUND Left 06/07/2019   Procedure: IRRIGATION AND DEBRIDEMENT WOUND;  Surgeon: Albertine Patricia, DPM;  Location: [facility];  Service: Podiatry;  Laterality: Left;  . LOWER EXTREMITY ANGIOGRAPHY  Left 06/03/2019   Procedure: Lower Extremity Angiography;  Surgeon: Katha Cabal, MD;  Location: [facility];  Service: Cardiovascular;  Laterality: Left;  . PERIPHERAL VASCULAR BALLOON ANGIOPLASTY Left 05/16/2019   Procedure: PERIPHERAL VASCULAR BALLOON ANGIOPLASTY;  Surgeon: Lorretta Harp, MD;  Location: [facility];  Service: Cardiovascular;  Laterality: Left;  SFA  . VEIN LIGATION AND STRIPPING      Home Medications:  Allergies as of 06/14/2020      Reactions   Maxzide [triamterene-hctz] Other (See Comments)   Weakness/fatigue      Medication List       Accurate as of June 14, 2020  3:00 PM. If you have any questions, ask your nurse or doctor.        acetaminophen 650 MG CR tablet Commonly known as: TYLENOL Take 650 mg by mouth every 4 (four) hours as needed for pain.   allopurinol 300 MG tablet Commonly known as: ZYLOPRIM Take 1 tablet by mouth once daily   amLODipine 10 MG tablet Commonly known as: NORVASC TAKE 1 TABLET BY MOUTH DAILY   aspirin 81 MG tablet Take 81 mg by mouth daily. Reported on 07/24/2015   atorvastatin 40 MG tablet Commonly known as: LIPITOR Take 1 tablet (40 mg total) by mouth daily.   clopidogrel 75 MG tablet Commonly known as: PLAVIX Take 1 tablet by mouth once daily with breakfast   folic acid 1 MG tablet Commonly known as: FOLVITE Take 1 mg by  mouth every evening.   glucose blood test strip Commonly known as: Estate manager/land agent USE  STRIP TO CHECK GLUCOSE TWICE DAILY. Dx: E11.9   losartan 100 MG tablet Commonly known as: COZAAR Take 1 tablet by mouth once daily   metFORMIN 500 MG tablet Commonly known as: GLUCOPHAGE Take 1 tablet by mouth once daily   methotrexate 2.5 MG tablet Commonly known as: RHEUMATREX TAKE 4 TABLETS BY MOUTH EVERY 7 DAYS.   metoprolol succinate 50 MG 24 hr tablet Commonly known as: TOPROL-XL TAKE 1 TABLET BY MOUTH ONCE DAILY IMMEDIATLY  FOLLOWING  A  MEAL   Microlet Lancets  Misc Check sugar once daily, [employer] Microlet Lancets. Dx E11.9   pantoprazole 40 MG tablet Commonly known as: PROTONIX Take 1 tablet by mouth once daily   spironolactone 25 MG tablet Commonly known as: ALDACTONE Take 1 tablet by mouth once daily       Allergies:  Allergies  Allergen Reactions  . Maxzide [Triamterene-Hctz] Other (See Comments)    Weakness/fatigue     Family History: Family History  Problem Relation Age of Onset  . Arthritis Mother   . Heart attack Father   . Colon cancer Neg Hx   . Breast cancer Neg Hx     Social History:  reports that she quit smoking about 32 years ago. She has a 7.50 pack-year smoking history. She has never used smokeless tobacco. She reports that she does not drink alcohol and does not use drugs.   Physical Exam: BP 135/78   Pulse 69   Ht 5\' 6"  (1.676 m)   Wt 220 lb (99.8 kg)   LMP 05/25/1988   BMI 35.51 kg/m   Constitutional:  Alert and oriented, No acute distress.   Pertinent Imaging: Images were personally reviewed and interpreted  Ultrasound renal complete  Narrative CLINICAL DATA:  Renal mass  EXAM: RENAL / URINARY TRACT ULTRASOUND COMPLETE  COMPARISON:  November 03, 2019  FINDINGS: Right Kidney:  Renal measurements: 9.9 x 4.7 x 4.9 cm = volume: 117.5 mL. Echogenicity and renal cortical thickness are within normal limits. No mass, perinephric fluid, or hydronephrosis visualized. No sonographically demonstrable calculus or ureterectasis.  Left Kidney:  Renal measurements: 9.8 x 4.9 x 4.5 cm = volume: 113.2 mL. Echogenicity and renal cortical thickness are within normal limits. No perinephric fluid or hydronephrosis visualized. There is a subtly hyperechoic mass in the lower pole of the left kidney measuring 1.4 x 1.5 x 1.6 cm, not felt to be appreciably changed compared to prior study allowing for slight differences in scan plane. No new mass. No sonographically demonstrable calculus or  ureterectasis.  Bladder:  Appears normal for degree of bladder distention.  Other:  None.  IMPRESSION: Slightly hyperechoic solid mass arising from the lower pole left kidney measuring 1.4 x 1.5 x 1.6 cm, not felt to be significantly changed from prior study. This solid mass warrants continued surveillance. A follow-up renal ultrasound in 4-6 months to assess for stability advised. If more aggressive surveillance is felt to be warranted, renal MR would be the optimum study of choice to more precisely assess the architecture of this lesion.  Study otherwise unremarkable.  These results will be called to the ordering clinician or representative by the Radiologist Assistant, and communication documented in the PACS or [employer].   Electronically Signed By: Lowella Grip III M.D. On: 05/23/2020 13:01   Assessment & Plan:    1.  Left renal mass  Small, stable hyperechoic left  renal mass  Recommend renal mass protocol MRI in 6 months for further characterization   Abbie Sons, MD  [facility] [address] [place], [place] [address] [phone]

## 2020-06-16 ENCOUNTER — Encounter: Payer: Self-pay | Admitting: Urology

## 2020-07-03 ENCOUNTER — Other Ambulatory Visit: Payer: Self-pay

## 2020-07-03 ENCOUNTER — Ambulatory Visit (INDEPENDENT_AMBULATORY_CARE_PROVIDER_SITE_OTHER): Payer: Medicare Other | Admitting: Internal Medicine

## 2020-07-03 ENCOUNTER — Other Ambulatory Visit: Payer: Self-pay | Admitting: Internal Medicine

## 2020-07-03 VITALS — BP 122/62 | HR 60 | Temp 98.1°F | Resp 16 | Ht 66.0 in | Wt 224.0 lb

## 2020-07-03 DIAGNOSIS — M069 Rheumatoid arthritis, unspecified: Secondary | ICD-10-CM | POA: Diagnosis not present

## 2020-07-03 DIAGNOSIS — E7800 Pure hypercholesterolemia, unspecified: Secondary | ICD-10-CM

## 2020-07-03 DIAGNOSIS — D696 Thrombocytopenia, unspecified: Secondary | ICD-10-CM | POA: Diagnosis not present

## 2020-07-03 DIAGNOSIS — N1831 Chronic kidney disease, stage 3a: Secondary | ICD-10-CM

## 2020-07-03 DIAGNOSIS — E119 Type 2 diabetes mellitus without complications: Secondary | ICD-10-CM | POA: Diagnosis not present

## 2020-07-03 DIAGNOSIS — I739 Peripheral vascular disease, unspecified: Secondary | ICD-10-CM

## 2020-07-03 DIAGNOSIS — M79673 Pain in unspecified foot: Secondary | ICD-10-CM

## 2020-07-03 DIAGNOSIS — I4949 Other premature depolarization: Secondary | ICD-10-CM

## 2020-07-03 DIAGNOSIS — I890 Lymphedema, not elsewhere classified: Secondary | ICD-10-CM

## 2020-07-03 DIAGNOSIS — I10 Essential (primary) hypertension: Secondary | ICD-10-CM

## 2020-07-03 DIAGNOSIS — E1159 Type 2 diabetes mellitus with other circulatory complications: Secondary | ICD-10-CM

## 2020-07-03 DIAGNOSIS — I272 Pulmonary hypertension, unspecified: Secondary | ICD-10-CM

## 2020-07-03 DIAGNOSIS — R7989 Other specified abnormal findings of blood chemistry: Secondary | ICD-10-CM

## 2020-07-03 LAB — HEPATIC FUNCTION PANEL
ALT: 11 U/L (ref 0–35)
AST: 15 U/L (ref 0–37)
Albumin: 3.7 g/dL (ref 3.5–5.2)
Alkaline Phosphatase: 78 U/L (ref 39–117)
Bilirubin, Direct: 0.2 mg/dL (ref 0.0–0.3)
Total Bilirubin: 0.7 mg/dL (ref 0.2–1.2)
Total Protein: 6.8 g/dL (ref 6.0–8.3)

## 2020-07-03 LAB — LIPID PANEL
Cholesterol: 88 mg/dL (ref 0–200)
HDL: 36.4 mg/dL — ABNORMAL LOW (ref >39.00)
LDL Cholesterol: 31 mg/dL (ref 0–99)
NonHDL: 51.96
Total CHOL/HDL Ratio: 2
Triglycerides: 107 mg/dL (ref 0.0–149.0)
VLDL: 21.4 mg/dL (ref 0.0–40.0)

## 2020-07-03 LAB — CBC WITH DIFFERENTIAL/PLATELET
Basophils Absolute: 0 10*3/uL (ref 0.0–0.1)
Basophils Relative: 0.8 % (ref 0.0–3.0)
Eosinophils Absolute: 0 10*3/uL (ref 0.0–0.7)
Eosinophils Relative: 0.6 % (ref 0.0–5.0)
HCT: 37.5 % (ref 36.0–46.0)
Hemoglobin: 12.3 g/dL (ref 12.0–15.0)
Lymphocytes Relative: 15.2 % (ref 12.0–46.0)
Lymphs Abs: 0.8 10*3/uL (ref 0.7–4.0)
MCHC: 32.8 g/dL (ref 30.0–36.0)
MCV: 104.5 fl — ABNORMAL HIGH (ref 78.0–100.0)
Monocytes Absolute: 0.6 10*3/uL (ref 0.1–1.0)
Monocytes Relative: 11.5 % (ref 3.0–12.0)
Neutro Abs: 3.7 10*3/uL (ref 1.4–7.7)
Neutrophils Relative %: 71.9 % (ref 43.0–77.0)
Platelets: 137 10*3/uL — ABNORMAL LOW (ref 150.0–400.0)
RBC: 3.59 Mil/uL — ABNORMAL LOW (ref 3.87–5.11)
RDW: 18.8 % — ABNORMAL HIGH (ref 11.5–15.5)
WBC: 5.2 10*3/uL (ref 4.0–10.5)

## 2020-07-03 LAB — HEMOGLOBIN A1C: Hgb A1c MFr Bld: 6 % (ref 4.6–6.5)

## 2020-07-03 LAB — BASIC METABOLIC PANEL
BUN: 22 mg/dL (ref 6–23)
CO2: 28 mEq/L (ref 19–32)
Calcium: 9.6 mg/dL (ref 8.4–10.5)
Chloride: 104 mEq/L (ref 96–112)
Creatinine, Ser: 0.94 mg/dL (ref 0.40–1.20)
GFR: 57.87 mL/min — ABNORMAL LOW (ref >60.00)
Glucose, Bld: 92 mg/dL (ref 70–99)
Potassium: 3.9 mEq/L (ref 3.5–5.1)
Sodium: 141 mEq/L (ref 135–145)

## 2020-07-03 LAB — MAGNESIUM: Magnesium: 1.2 mg/dL — ABNORMAL LOW (ref 1.5–2.5)

## 2020-07-03 LAB — TSH: TSH: 4.33 u[IU]/mL (ref 0.35–4.50)

## 2020-07-03 NOTE — Progress Notes (Signed)
Patient ID: Nina Perez, female   DOB: [birth year], 80 y.o.   MRN: [id number]   Subjective:    Patient ID: Nina Perez, female    DOB: 07-08-1940, 80 y.o.   MRN: [id number]  HPI This visit occurred during the SARS-CoV-2 public health emergency.  Safety protocols were in place, including screening questions prior to the visit, additional usage of staff PPE, and extensive cleaning of exam room while observing appropriate contact time as indicated for disinfecting solutions.  Patient here for a scheduled follow up. Was scheduled for a physical, but she declined physical and wanted f/u appt.  Here to follow up regarding her blood sugar, blood pressure and cholesterol.  Also planning to have knee surgery.  Has had persistent knee pain - limiting her activity.  Affecting her walking.  Saw Dr Marry Guan.  Planning for knee surgery once covid restrictions lifted and able.  She denies any chest pain.  Not very mobile.  Breathing overall stable.  No acid reflux.  No abdominal pain.  Bowels moving. Saw Dr Bernardo Heater 06/14/20 f/u left renal mass.  Felt stable.  Recommended f/u MRI in 6 months.  Discussed diet.  States sugars averaging 90-130.  No low sugars.  No abdominal pain.  Bowels moving.  Persistent foot pain.  Was seeing Dr Elvina Mattes.  Request referral back to podiatry.    Past Medical History:  Diagnosis Date  . Barrett's esophagus   . Cancer ([place])    skin ca  . Diabetes mellitus ([place])   . Gout   . History of colon cancer    adenomatous polyps  . Hypercholesterolemia   . Hypertension   . Inflammatory arthritis   . Osteoarthritis    knees, spine  . Phlebitis    x2 (with pregnancy)  . 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 Pulmonary hypertension ([place])   . Reflux esophagitis   . Rheumatoid arthritis(714.0)    positive RF, FANA, RNP, negative CCP ab and anti DNA,.  neg anti-SCL 70  . 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  ABDOMINAL AORTOGRAM W/LOWER EXTREMITY Left 05/16/2019   Procedure: ABDOMINAL AORTOGRAM W/LOWER EXTREMITY;  Surgeon: Lorretta Harp, MD;  Location: [facility];  Service: Cardiovascular;  Laterality: Left;  . AMPUTATION TOE Left 06/02/2019   Procedure: AMPUTATION TOE;  Surgeon: Albertine Patricia, DPM;  Location: [facility];  Service: Podiatry;  Laterality: Left;  . BREAST CYST ASPIRATION Left    neg  . INCISION AND DRAINAGE OF WOUND Left 06/07/2019   Procedure: IRRIGATION AND DEBRIDEMENT WOUND;  Surgeon: Albertine Patricia, DPM;  Location: [facility];  Service: Podiatry;  Laterality: Left;  . LOWER EXTREMITY ANGIOGRAPHY Left 06/03/2019   Procedure: Lower Extremity Angiography;  Surgeon: Katha Cabal, MD;  Location: [facility];  Service: Cardiovascular;  Laterality: Left;  . PERIPHERAL VASCULAR BALLOON ANGIOPLASTY Left 05/16/2019   Procedure: PERIPHERAL VASCULAR BALLOON ANGIOPLASTY;  Surgeon: Lorretta Harp, MD;  Location: [facility];  Service: Cardiovascular;  Laterality: Left;  SFA  . VEIN LIGATION AND STRIPPING     Family History  Problem Relation Age of Onset  . Arthritis Mother   . Heart attack Father   . Colon cancer Neg Hx   . Breast cancer Neg Hx    Social History   Socioeconomic History  . Marital status: Married    Spouse name: Not on file  . Number of  children: Not on file  . Years of education: Not on file  . Highest education level: Not on file  Occupational History  . Not on file  Tobacco Use  . Smoking status: Former Smoker    Packs/day: 0.50    Years: 15.00    Pack years: 7.50    Quit date: 05/12/1988    Years since quitting: 32.1  . Smokeless tobacco: Never Used  Vaping Use  . Vaping Use: Never used  Substance and Sexual Activity  . Alcohol use: No    Alcohol/week: 0.0 standard drinks  . Drug use: No  . Sexual activity: Yes  Other Topics Concern  . Not on file  Social History Narrative  . Not on file   Social Determinants of  Health   Financial Resource Strain: Not on file  Food Insecurity: Not on file  Transportation Needs: Not on file  Physical Activity: Not on file  Stress: Not on file  Social Connections: Socially Integrated  . Frequency of Communication with Friends and Family: More than three times a week  . Frequency of Social Gatherings with Friends and Family: Not on file  . Attends Religious Services: More than 4 times per year  . Active Member of Clubs or Organizations: Yes  . Attends Archivist Meetings: More than 4 times per year  . Marital Status: Married    Outpatient Encounter Medications as of 07/03/2020  Medication Sig  . acetaminophen (TYLENOL) 650 MG CR tablet Take 650 mg by mouth every 4 (four) hours as needed for pain.   Marland Kitchen allopurinol (ZYLOPRIM) 300 MG tablet Take 1 tablet by mouth once daily  . amLODipine (NORVASC) 10 MG tablet TAKE 1 TABLET BY MOUTH DAILY  . aspirin 81 MG tablet Take 81 mg by mouth daily. Reported on 07/24/2015  . atorvastatin (LIPITOR) 40 MG tablet Take 1 tablet (40 mg total) by mouth daily.  . clopidogrel (PLAVIX) 75 MG tablet Take 1 tablet by mouth once daily with breakfast  . folic acid (FOLVITE) 1 MG tablet Take 1 mg by mouth every evening.   Marland Kitchen glucose blood (BAYER CONTOUR TEST) test strip USE  STRIP TO CHECK GLUCOSE TWICE DAILY. Dx: E11.9  . losartan (COZAAR) 100 MG tablet Take 1 tablet by mouth once daily  . methotrexate (RHEUMATREX) 2.5 MG tablet TAKE 4 TABLETS BY MOUTH EVERY 7 DAYS.  Marland Kitchen metoprolol succinate (TOPROL-XL) 50 MG 24 hr tablet TAKE 1 TABLET BY MOUTH ONCE DAILY IMMEDIATLY  FOLLOWING  A  MEAL  . MICROLET LANCETS MISC Check sugar once daily, [employer] Microlet Lancets. Dx E11.9  . pantoprazole (PROTONIX) 40 MG tablet Take 1 tablet by mouth once daily  . spironolactone (ALDACTONE) 25 MG tablet Take 1 tablet by mouth once daily  . [DISCONTINUED] metFORMIN (GLUCOPHAGE) 500 MG tablet Take 1 tablet by mouth once daily   No facility-administered  encounter medications on file as of 07/03/2020.    Review of Systems  Constitutional: Negative for appetite change and unexpected weight change.  HENT: Negative for congestion and sinus pressure.   Respiratory: Negative for cough and chest tightness.        Breathing stable.   Cardiovascular: Negative for chest pain and palpitations.       No increased swelling.    Gastrointestinal: Negative for abdominal pain, diarrhea, nausea and vomiting.  Genitourinary: Negative for difficulty urinating and dysuria.  Musculoskeletal: Negative for myalgias.       Knee pain as outlined.    Skin: Negative  for color change and rash.  Neurological: Negative for dizziness, light-headedness and headaches.  Psychiatric/Behavioral: Negative for agitation and dysphoric mood.       Objective:    Physical Exam Vitals reviewed.  Constitutional:      General: She is not in acute distress.    Appearance: Normal appearance.  HENT:     Head: Normocephalic and atraumatic.     Right Ear: External ear normal.     Left Ear: External ear normal.     Mouth/Throat:     Mouth: Oropharynx is clear and moist.  Eyes:     General: No scleral icterus.       Right eye: No discharge.        Left eye: No discharge.     Conjunctiva/sclera: Conjunctivae normal.  Neck:     Thyroid: No thyromegaly.  Cardiovascular:     Rate and Rhythm: Normal rate and regular rhythm.     Comments: Appears to be regular with frequent premature beats.  Pulmonary:     Effort: No respiratory distress.     Breath sounds: Normal breath sounds. No wheezing.  Abdominal:     General: Bowel sounds are normal.     Palpations: Abdomen is soft.     Tenderness: There is no abdominal tenderness.  Musculoskeletal:        General: No swelling, tenderness or edema.     Cervical back: Neck supple. No tenderness.  Lymphadenopathy:     Cervical: No cervical adenopathy.  Skin:    Findings: No erythema or rash.  Neurological:     Mental Status: She  is alert.  Psychiatric:        Mood and Affect: Mood normal.        Behavior: Behavior normal.     BP 122/62   Pulse 60   Temp 98.1 F (36.7 C) (Oral)   Resp 16   Ht '5\' 6"'  (1.676 m)   Wt 224 lb (101.6 kg)   LMP 05/25/1988   SpO2 97%   BMI 36.15 kg/m  Wt Readings from Last 3 Encounters:  07/03/20 224 lb (101.6 kg)  06/14/20 220 lb (99.8 kg)  02/29/20 227 lb 3.2 oz (103.1 kg)     Lab Results  Component Value Date   WBC 5.2 07/03/2020   HGB 12.3 07/03/2020   HCT 37.5 07/03/2020   PLT 137.0 (L) 07/03/2020   GLUCOSE 92 07/03/2020   CHOL 88 07/03/2020   TRIG 107.0 07/03/2020   HDL 36.40 (L) 07/03/2020   LDLCALC 31 07/03/2020   ALT 11 07/03/2020   AST 15 07/03/2020   NA 141 07/03/2020   K 3.9 07/03/2020   CL 104 07/03/2020   CREATININE 0.94 07/03/2020   BUN 22 07/03/2020   CO2 28 07/03/2020   TSH 4.33 07/03/2020   INR 1.1 06/02/2019   HGBA1C 6.0 07/03/2020   MICROALBUR 7.6 (H) 02/10/2019    Ultrasound renal complete  Result Date: 05/23/2020 CLINICAL DATA:  Renal mass EXAM: RENAL / URINARY TRACT ULTRASOUND COMPLETE COMPARISON:  November 03, 2019 FINDINGS: Right Kidney: Renal measurements: 9.9 x 4.7 x 4.9 cm = volume: 117.5 mL. Echogenicity and renal cortical thickness are within normal limits. No mass, perinephric fluid, or hydronephrosis visualized. No sonographically demonstrable calculus or ureterectasis. Left Kidney: Renal measurements: 9.8 x 4.9 x 4.5 cm = volume: 113.2 mL. Echogenicity and renal cortical thickness are within normal limits. No perinephric fluid or hydronephrosis visualized. There is a subtly hyperechoic mass in the lower pole  of the left kidney measuring 1.4 x 1.5 x 1.6 cm, not felt to be appreciably changed compared to prior study allowing for slight differences in scan plane. No new mass. No sonographically demonstrable calculus or ureterectasis. Bladder: Appears normal for degree of bladder distention. Other: None. IMPRESSION: Slightly hyperechoic  solid mass arising from the lower pole left kidney measuring 1.4 x 1.5 x 1.6 cm, not felt to be significantly changed from prior study. This solid mass warrants continued surveillance. A follow-up renal ultrasound in 4-6 months to assess for stability advised. If more aggressive surveillance is felt to be warranted, renal MR would be the optimum study of choice to more precisely assess the architecture of this lesion. Study otherwise unremarkable. These results will be called to the ordering clinician or representative by the Radiologist Assistant, and communication documented in the PACS or [employer]. Electronically Signed   By: Lowella Grip III M.D.   On: 05/23/2020 13:01       Assessment & Plan:   Problem List Items Addressed This Visit    CKD (chronic kidney disease) stage 3, GFR 30-59 ml/min (HCC)    Avoid antiinflammatories.  Follow metabolic panel.        Diabetes mellitus ([place])    Low carb diet and exercise.  On metfromin.  Follow met b and a1c.   Lab Results  Component Value Date   HGBA1C 6.0 07/03/2020        Relevant Orders   Hemoglobin A1c (Completed)   Basic metabolic panel (Completed)   Ambulatory referral to Cardiology   Ambulatory referral to Cardiology   Elevated TSH   Hypercholesterolemia    On lipitor.  Low cholesterol diet and exercise.  Follow lipid panel and liver function tests.        Relevant Orders   Lipid panel (Completed)   Hepatic function panel (Completed)   TSH (Completed)   Hypertension    Blood pressure doing well.  Continue metoprolol, losartan, spironolactone and amlodipine.  Follow pressures.  Follow metabolic panel.       Lymphedema    Followed by vascular surgery.  Continue compression hose.        PAD (peripheral artery disease) (HCC)    S/p angiogram, angioplasty and toe amputation.  Continue lipitor.  Continue plavix.  With some residual pain in her foot.  Request referral back to podiatry.        Relevant Orders    Ambulatory referral to Podiatry   Ambulatory referral to Cardiology   Premature beats    Increased premature beats on exam.  EKG - with SR - bigeminy - LBBB.  Planning for upcoming knee surgery when able to schedule.  Limited activity.  Will have cardiology evaluate for pre op evaluation.        Relevant Orders   EKG 12-Lead (Completed)   Magnesium (Completed)   Ambulatory referral to Cardiology   Pulmonary hypertension, mild ([employer])    F/u with pulmonary.  Per note, oxygen at night.        Rheumatoid arthritis ([place])    Followed by Dr Jefm Bryant.  On MTX.        Thrombocytopenia ([place])    Recheck cbc - last check slightly decreased.        Relevant Orders   CBC with Differential/Platelet (Completed)   RESOLVED: Type 2 diabetes mellitus without complications ([place]) - Primary   Relevant Orders   Hemoglobin A1c (Completed)   Basic metabolic panel (Completed)   Ambulatory referral to  Cardiology    Other Visit Diagnoses    Pain of foot, unspecified laterality       Relevant Orders   Ambulatory referral to Podiatry       Einar Pheasant, MD

## 2020-07-04 ENCOUNTER — Other Ambulatory Visit: Payer: Self-pay | Admitting: Internal Medicine

## 2020-07-04 DIAGNOSIS — Z8639 Personal history of other endocrine, nutritional and metabolic disease: Secondary | ICD-10-CM

## 2020-07-04 DIAGNOSIS — D649 Anemia, unspecified: Secondary | ICD-10-CM

## 2020-07-04 DIAGNOSIS — D7589 Other specified diseases of blood and blood-forming organs: Secondary | ICD-10-CM

## 2020-07-04 LAB — HM DIABETES EYE EXAM

## 2020-07-04 NOTE — Progress Notes (Signed)
Order placed for f/u labs.  

## 2020-07-05 ENCOUNTER — Other Ambulatory Visit: Payer: Self-pay

## 2020-07-05 MED ORDER — MAGNESIUM OXIDE 400 (241.3 MG) MG PO TABS: 400.0000 mg | ORAL_TABLET | Freq: Two times a day (BID) | ORAL | 1 refills | Status: DC

## 2020-07-08 ENCOUNTER — Encounter: Payer: Self-pay | Admitting: Internal Medicine

## 2020-07-08 NOTE — Assessment & Plan Note (Signed)
Avoid antiinflammatories.  Follow metabolic panel.  

## 2020-07-08 NOTE — Assessment & Plan Note (Signed)
Increased premature beats on exam.  EKG - with SR - bigeminy - LBBB.  Planning for upcoming knee surgery when able to schedule.  Limited activity.  Will have cardiology evaluate for pre op evaluation.

## 2020-07-08 NOTE — Assessment & Plan Note (Signed)
Blood pressure doing well.  Continue metoprolol, losartan, spironolactone and amlodipine.  Follow pressures.  Follow metabolic panel.

## 2020-07-08 NOTE — Assessment & Plan Note (Signed)
Followed by vascular surgery.  Continue compression hose.

## 2020-07-08 NOTE — Assessment & Plan Note (Signed)
S/p angiogram, angioplasty and toe amputation.  Continue lipitor.  Continue plavix.  With some residual pain in her foot.  Request referral back to podiatry.

## 2020-07-08 NOTE — Assessment & Plan Note (Signed)
On lipitor.  Low cholesterol diet and exercise.  Follow lipid panel and liver function tests.   

## 2020-07-08 NOTE — Assessment & Plan Note (Signed)
Followed by Dr Jefm Bryant.  On MTX.

## 2020-07-08 NOTE — Assessment & Plan Note (Signed)
Low carb diet and exercise.  On metfromin.  Follow met b and a1c.   Lab Results  Component Value Date   HGBA1C 6.0 07/03/2020

## 2020-07-08 NOTE — Assessment & Plan Note (Signed)
Recheck cbc - last check slightly decreased.

## 2020-07-08 NOTE — Assessment & Plan Note (Signed)
F/u with pulmonary.  Per note, oxygen at night.

## 2020-07-10 ENCOUNTER — Other Ambulatory Visit: Payer: Self-pay | Admitting: Internal Medicine

## 2020-07-12 ENCOUNTER — Other Ambulatory Visit (INDEPENDENT_AMBULATORY_CARE_PROVIDER_SITE_OTHER): Payer: Medicare Other

## 2020-07-12 ENCOUNTER — Other Ambulatory Visit: Payer: Self-pay

## 2020-07-12 DIAGNOSIS — D7589 Other specified diseases of blood and blood-forming organs: Secondary | ICD-10-CM

## 2020-07-12 DIAGNOSIS — Z8639 Personal history of other endocrine, nutritional and metabolic disease: Secondary | ICD-10-CM

## 2020-07-12 LAB — MAGNESIUM: Magnesium: 1.5 mg/dL (ref 1.5–2.5)

## 2020-07-12 LAB — VITAMIN B12: Vitamin B-12: 1471 pg/mL — ABNORMAL HIGH (ref 211–911)

## 2020-07-13 ENCOUNTER — Other Ambulatory Visit: Payer: Self-pay | Admitting: Internal Medicine

## 2020-07-13 NOTE — Progress Notes (Signed)
Order placed for f/u magnesium level.

## 2020-07-14 LAB — FOLATE RBC: RBC Folate: 603 ng/mL RBC (ref >280)

## 2020-08-01 ENCOUNTER — Other Ambulatory Visit: Payer: Self-pay | Admitting: Internal Medicine

## 2020-08-06 DIAGNOSIS — R9431 Abnormal electrocardiogram [ECG] [EKG]: Secondary | ICD-10-CM | POA: Insufficient documentation

## 2020-08-06 NOTE — Progress Notes (Signed)
 New Patient Visit   Chief Complaint: Chief Complaint  Patient presents with  . Shortness of Breath  . Abnormal ECG  . Hypertension   Date of Service: 08/06/2020 Date of Birth: 17-Jul-1940 PCP: Glendia Allena RAMAN, MD  History of Present Illness: Nina Perez is a 80 y.o.female patient Shortness of breath The patient presents with acute on chronic  shortness of breath worsening with increased severity over the last 1 months which occurs with mild exertion and limits ADLs associated with walking fast and relived by rest and lasting intermittent (1-10 minutes). Other related symptoms include fatigue, lower extremity edema and limb pain. The differential diagnosis includes congestive heart failure, hypertension, anginal equivalent, decrease exercise tolerance and rhythm disturbance  Abnormal ECG The patient has an EKG that has been reported to be abnormal.  The time period of this abnormality has been years.  The patient additionally has had symptoms including shortness of breath NSR and LBBB Essential Hypertension The patient has been on medication management listed below for essential hypertension.  Reported average blood pressure readings recently have shown that the blood pressure is stable.  The patient has not had any side effects of these medications at this time.  We have discussed treatment goals with the patient for which they understand and agree. This includes medication management, lifestyle changes, and diet management. Currently there is no apparent secondary causes of the hypertension at this time. Hyperlipidemia The patient does have hyperlipidemia on High intensity therapy with atorvastatin  (Lipitor ). The lipid levels appear to be stable at this time without apparent significant side effects of medications. There patient currently has knowledge of the continued goals of treatment to reduce cardiovascular risk and/or complication.  Reasons for risk reduction and further prevention measures  include diabetes, > 7.5% 10 year cardiovascular risk score and high LDL now 31 Diabetes The patient has a significant cardiovascular risk factor including diabetes mellitus for which they are on medication management. The patient understands the significance of future cardiovascular complications with this diagnosis.  The patient has current diabetic secondary effects including none. Glucose and hemoglobin A1c levels have improved since last visit with HbA1c  6-6.9%. Current medical management listed below has been discussed. We have discussed appropriate lifestyle changes and medical management including low-carbohydrate diet and the DASH diet.       Past Medical and Surgical History  Past Medical History Past Medical History:  Diagnosis Date  . Adenomatous colon polyp, unspecified   . Chicken pox   . Diabetes mellitus type 2, uncomplicated (CMS-HCC)   . GERD (gastroesophageal reflux disease)   . Gout   . History of Barrett's esophagus   . Hypercholesterolemia   . Hypertension   . Obesity   . Osteoarthritis ([facility]) 02/27/2014     a.  Knees.   b.  Spine.    . Phlebitis    x 2 with pregnancies  . Pulmonary fibrosis (CMS-HCC)   . Pulmonary hypertension (CMS-HCC)   . 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  Raynaud's.   b.  Sclerodactyly, telangiectasias   c.  Positive rheumatoid factor, FANA, RNP.  Negative anti-CCP antibody and anti-DNA.  Negative anti-SCL 70.    d.  Inflammatory arthritis.    e.  Mild pulmonary fibrosis    f.  Pulmonary hypertension      Past Surgical History She has a past surgical history that includes Colonoscopy (08/20/2004); egd (05/14/2012, 02/02/2008, 08/20/2004, 01/15/1999); Colonoscopy (05/14/2012, 02/02/2008); WISDOM TEETH; LEFT FOURTH TOE AMPUTATION (Left);  and VASCULAR SURGERY BILATERAL LEG.   Medications and Allergies  Current Medications  Current Outpatient Medications  Medication Sig Dispense Refill  . acetaminophen   (TYLENOL ) 325 MG tablet Take 650 mg by mouth every 4 (four) hours as needed for Pain.    . allopurinol  (ZYLOPRIM ) 300 MG tablet Take 300 mg by mouth once daily.    . amLODIPine  (NORVASC ) 10 MG tablet Take 10 mg by mouth once daily.    . aspirin  81 MG chewable tablet Take 81 mg by mouth once daily.    . atorvastatin  (LIPITOR ) 40 MG tablet Take 40 mg by mouth once daily       . clopidogreL  (PLAVIX ) 75 mg tablet Take 75 mg by mouth daily with breakfast    . folic acid  (FOLVITE ) 1 MG tablet Take 1 tablet by mouth once daily 90 tablet 1  . FUROsemide (LASIX) 20 MG tablet Take 1 tablet (20 mg total) by mouth once daily 7 tablet 0  . losartan  (COZAAR ) 100 MG tablet Take 100 mg by mouth once daily    . magnesium  oxide (MAG-OX) 400 mg (241.3 mg magnesium ) tablet Take 1 tablet by mouth 2 (two) times daily    . metFORMIN  (GLUCOPHAGE ) 500 MG tablet Take 500 mg by mouth once daily.     . methotrexate  (RHEUMATREX) 2.5 MG tablet Take 4 tablets (10 mg total) by mouth every 7 (seven) days 16 tablet 5  . metoprolol  succinate (TOPROL -XL) 50 MG XL tablet Take 50 mg by mouth once daily.    . miscellaneous medical supply Misc INCENTIVE SPIROMETER - USE AS DIRECTED 1 each 1  . pantoprazole  (PROTONIX ) 40 MG DR tablet Take 40 mg by mouth once daily     No current facility-administered medications for this visit.    Allergies: Maxzide [triamterene-hydrochlorothiazid]  Social and Family History  Social History  reports that she quit smoking about 32 years ago. Her smoking use included cigarettes. She quit after 29.00 years of use. She has never used smokeless tobacco. She reports that she does not drink alcohol and does not use drugs.  Family History Family History  Problem Relation Age of Onset  . Myocardial Infarction (Heart attack) Father   . Myocardial Infarction (Heart attack) Mother   . Arthritis Mother   . No Known Problems Sister   . No Known Problems Brother     Review of Systems   Positive for sob  edema Review of Systems:negative for weight gain, weight loss, weakness, fatigue, vision change, hearing loss, cough, congestion, PND, orthopnea, heartburn, nausea, vomiting, diarrhea, bloody stools, melena, stomach pain, leg weakness eg blood clots leg cramping, headache, blackouts, nosebleed, trouble swallowing, frequent urination, urination at night, skin rashes, skin lesions, muscle weakness, numbness, tingling, anxiety, depression  Physical Examination   Vitals:BP 124/80 (BP Location: Left upper arm, Patient Position: Sitting, BP Cuff Size: Large Adult)   Pulse 76   Resp 16   Ht 167.6 cm (5' 6)   Wt 100.7 kg (222 lb)   SpO2 96%   BMI 35.83 kg/m  Ht:167.6 cm (5' 6) Wt:100.7 kg (222 lb) ADJ:Anib surface area is 2.17 meters squared. Body mass index is 35.83 kg/m. Appearance: well appearing in no acute distress HEENT: Pupils equally reactive to light and accomodation no apparent xantholasma or other apparent lesions  Neck: Supple without masses or lymphadenopathy Lungs: normal respiratory effort; no wheezes, no crackles, no rhonchi Heart: Regular rate and rhythm. Normal S1 S2  No gallops, 2+lsb murmurs,   PMI is  normal size and placement. carotid upstroke normal without bruit. Jugular venous pressure is normal Abdomen: soft, nontender,  non distended, with normal bowel sounds. Abdominal aorta is normal size without bruit. No apparent masses Extremities:  Trace  edema, no cyanosis, no clubbing, no ulcers Peripheral Pulses: 2+ in all extremities, 2+ femoral pulses bilaterally, 2+dp pulses Musculoskeletal;  Normal muscle tone without kyphosis  Neurological:  Cranial nerves intact, Oriented to time, place, and person  Assessment   80 y.o. female with  Encounter Diagnoses  Name Primary?  . Establishing care with new doctor, encounter for   . Hypercholesterolemia   . Primary hypertension   . Type 2 diabetes mellitus without complication, without long-term current use of insulin   (CMS-HCC)   . Abnormal ECG Yes         Plan  -Persantine SESTAMIBI for shortness of breath  -We have discussed risk reduction in the cardiovascular disease process by a continuation of lipid management with the current medication management for lipid reduction.  The goals continue to be 30-50% lowering of LDL cholesterol in addition to lifestyle measures.  This will include diet and improved activity level on a regular basis.The patient has an understanding of this discussion at this time and we will continue the appropriate strategy. -There has been a discussion of the current guidelines for hypertension control.  We will continue current medical regimen for hypertension control which will also help in risk factor modification of cardiovascular disease. The patient understands and agrees with the current plan.  We will be watching for possible future side effects of these medications. Additional home blood pressure monitoring is recommended if able. -Continue aggressive medical management of diabetes following the ABCs for prevention of cardiovascular disease and complications. The goals set forth include a goal HbA1c of less than 7, moderate to high intensity statin use if patient can tolerate, and a goal systolic blood pressure of below .  -We have discussed a diet and lifestyle program to improve quality of life measures and cardiovascular risk reduction. This includes a diet rich in fruits, vegetables, fiber, and nuts with lower amounts of saturated fats.  The patient was also instructed to do regular daily activity to help slowly burn calories.  We have discussed the possibility of a exercise prescription as well.  The patient is advised to begin this activity and watch for improvements over the next few months.     Orders Placed This Encounter  Procedures  . NM myocardial perfusion SPECT multiple (stress and rest)  . ECG 12-lead  . ECG stress test only    No follow-ups on  file.  WOLM GORDY RHYME, MD

## 2020-08-10 DIAGNOSIS — Z8616 Personal history of COVID-19: Secondary | ICD-10-CM

## 2020-08-10 HISTORY — DX: Personal history of COVID-19: Z86.16

## 2020-08-14 ENCOUNTER — Other Ambulatory Visit: Payer: Medicare Other

## 2020-08-14 ENCOUNTER — Other Ambulatory Visit (INDEPENDENT_AMBULATORY_CARE_PROVIDER_SITE_OTHER): Payer: Medicare Other

## 2020-08-14 ENCOUNTER — Other Ambulatory Visit: Payer: Self-pay

## 2020-08-15 LAB — MAGNESIUM: Magnesium: 1.9 mg/dL (ref 1.5–2.5)

## 2020-08-22 ENCOUNTER — Telehealth: Payer: Self-pay

## 2020-08-22 NOTE — Telephone Encounter (Signed)
Spoken to patient. Instructed patient on what to take and sent a mychart with instructions from Dr Derrel Nip. Patient stated she will go to an UC/ED if sx become worse.

## 2020-08-22 NOTE — Telephone Encounter (Signed)
Pt states that she tested positive for Covid last night. She states that she has a terrible sore throat and cough. She needs to know what to take for that. No appts avail with any provider today or tomorrow. Recommended UC across the street but she would like a call so she does not have to leave the house.  Please advise.

## 2020-08-23 ENCOUNTER — Other Ambulatory Visit (INDEPENDENT_AMBULATORY_CARE_PROVIDER_SITE_OTHER): Payer: Self-pay | Admitting: Nurse Practitioner

## 2020-08-23 DIAGNOSIS — I70249 Atherosclerosis of native arteries of left leg with ulceration of unspecified site: Secondary | ICD-10-CM

## 2020-08-23 NOTE — Telephone Encounter (Signed)
If she is too weak to stand, I think she needs to be evaluated.  May need labs, fluids, etc.

## 2020-08-23 NOTE — Telephone Encounter (Signed)
Gave info to [facility] urgent care. Patients husband said she is doing okay this afternoon. Eating, drinking, able to stand and acts like she is getting some of her strength back. Advised she may still need to be evaluated. He will take her to [place] if changes. They did not feel it was necessary to go right now.

## 2020-08-23 NOTE — Telephone Encounter (Signed)
PT Husband called and wanted to schedule covid infusion. Plz call PT husband back

## 2020-08-23 NOTE — Telephone Encounter (Signed)
Spoke with patients husband. She is having sore throat and cough. She is also very fatigued. Mobility is normally limited but this morning they were giving her a shower and she started to fall so husband lowered her to the floor and then helped her up. EMTs did come out this morning and did not feel that she needed to go to the hospital because she is not having any significantly acute symptoms (no SOB, chest tightness, fever, etc.) do you want to add her on and do a virtual?    Symptoms x 5-6 days Tested positive Monday

## 2020-08-27 ENCOUNTER — Encounter (INDEPENDENT_AMBULATORY_CARE_PROVIDER_SITE_OTHER): Payer: Medicare Other

## 2020-08-27 ENCOUNTER — Ambulatory Visit (INDEPENDENT_AMBULATORY_CARE_PROVIDER_SITE_OTHER): Payer: Medicare Other | Admitting: Vascular Surgery

## 2020-08-30 ENCOUNTER — Encounter: Payer: Self-pay | Admitting: Emergency Medicine

## 2020-08-30 ENCOUNTER — Other Ambulatory Visit: Payer: Self-pay

## 2020-08-30 ENCOUNTER — Ambulatory Visit
Admission: EM | Admit: 2020-08-30 | Discharge: 2020-08-30 | Disposition: A | Discharge status: Home / Self Care | Payer: Medicare Other | Attending: Physician Assistant | Admitting: Physician Assistant

## 2020-08-30 ENCOUNTER — Ambulatory Visit (INDEPENDENT_AMBULATORY_CARE_PROVIDER_SITE_OTHER): Payer: Medicare Other

## 2020-08-30 DIAGNOSIS — R059 Cough, unspecified: Secondary | ICD-10-CM | POA: Diagnosis not present

## 2020-08-30 DIAGNOSIS — U071 COVID-19: Secondary | ICD-10-CM

## 2020-08-30 DIAGNOSIS — J301 Allergic rhinitis due to pollen: Secondary | ICD-10-CM | POA: Diagnosis not present

## 2020-08-30 MED ORDER — FEXOFENADINE HCL 180 MG PO TABS: 180.0000 mg | ORAL_TABLET | Freq: Every day | ORAL | 0 refills | Status: DC

## 2020-08-30 MED ORDER — BENZONATATE 200 MG PO CAPS: 200.0000 mg | ORAL_CAPSULE | Freq: Two times a day (BID) | ORAL | 0 refills | Status: DC | PRN

## 2020-08-30 NOTE — ED Provider Notes (Signed)
[facility]    CSN: [id number] Arrival date & time: 08/30/20  1132      History   Chief Complaint Chief Complaint  Patient presents with  . Cough    HPI Nina Perez is a 80 y.o. female has continued coughing since she was diagnosed with covid on 4/11. Her cough is productive with clear mucous. Denies fever or sweats. Only had chills one day when it was cold. Her cough has been keeping her awake. Denies feeling SOB, and has not been wheezing. Denies swelling of her legs.     Past Medical History:  Diagnosis Date  . Barrett's esophagus   . Cancer ([place])    skin ca  . Diabetes mellitus ([place])   . Gout   . History of colon cancer    adenomatous polyps  . Hypercholesterolemia   . Hypertension   . Inflammatory arthritis   . Osteoarthritis    knees, spine  . Phlebitis    x2 (with pregnancy)  . 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 Pulmonary hypertension ([place])   . Reflux esophagitis   . Rheumatoid arthritis(714.0)    positive RF, FANA, RNP, negative CCP ab and anti DNA,.  neg anti-SCL 70  . Scleroderma ([place])    raynaud's, sclerodactyly, telangiectasias    Patient Active Problem List   Diagnosis Date Noted  . Premature beats 07/03/2020  . Dysuria 12/11/2019  . Adenomatous colon polyp 11/21/2019  . Phlebitis 11/21/2019  . Renal cyst 11/21/2019  . Swelling of lower extremity 10/26/2019  . Hypomagnesemia 06/03/2019  . CKD (chronic kidney disease) stage 3, GFR 30-59 ml/min (HCC) 06/02/2019  . 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 PAD (peripheral artery disease) ([place]) 05/29/2019  . Critical lower limb ischemia ([place]) 05/16/2019  . Open wound of left foot 03/27/2019  . Decreased GFR 11/13/2018  . Bladder incontinence 11/13/2018  . Elevated TSH 11/13/2018  . Thrombocytopenia ([place]) 07/03/2018  . Acute right-sided thoracic back pain 06/16/2018  . 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 Pulmonary hypertension, mild ([place]) 03/04/2018  .  Murmur 08/16/2017  . Lymphedema 03/04/2016  . Lesion of neck 12/15/2015  . Dizziness 12/15/2015  . Venous stasis dermatitis of right lower extremity 09/10/2015  . Varicose veins of right lower extremity with inflammation 09/10/2015  . Breast nodule 11/05/2014  . Health care maintenance 11/02/2014  . Obesity (BMI 30-39.9) 06/04/2014  . Osteoarthritis 02/27/2014  . Environmental allergies 01/15/2014  . Diabetes mellitus ([place]) 05/22/2012  . Hypertension 05/22/2012  . Hypercholesterolemia 05/22/2012  . Rheumatoid arthritis ([place]) 05/22/2012  . Barrett's esophagus 05/22/2012  . Gout 05/22/2012  . GERD (gastroesophageal reflux disease) 05/22/2012  . History of colon polyps 05/22/2012    Past Surgical History:  Procedure Laterality Date  . ABDOMINAL AORTOGRAM W/LOWER EXTREMITY Left 05/16/2019   Procedure: ABDOMINAL AORTOGRAM W/LOWER EXTREMITY;  Surgeon: Lorretta Harp, MD;  Location: [facility];  Service: Cardiovascular;  Laterality: Left;  . AMPUTATION TOE Left 06/02/2019   Procedure: AMPUTATION TOE;  Surgeon: Albertine Patricia, DPM;  Location: [facility];  Service: Podiatry;  Laterality: Left;  . BREAST CYST ASPIRATION Left    neg  . INCISION AND DRAINAGE OF WOUND Left 06/07/2019   Procedure: IRRIGATION AND DEBRIDEMENT WOUND;  Surgeon: Albertine Patricia, DPM;  Location: [facility];  Service: Podiatry;  Laterality: Left;  . LOWER EXTREMITY ANGIOGRAPHY Left 06/03/2019   Procedure: Lower Extremity Angiography;  Surgeon: Katha Cabal, MD;  Location: [facility];  Service: Cardiovascular;  Laterality: Left;  . PERIPHERAL VASCULAR BALLOON ANGIOPLASTY Left 05/16/2019   Procedure: PERIPHERAL VASCULAR BALLOON ANGIOPLASTY;  Surgeon: Lorretta Harp, MD;  Location: [place] [facility];  Service: Cardiovascular;  Laterality: Left;  SFA  . VEIN LIGATION AND STRIPPING      OB History   No obstetric history on file.      Home Medications    Prior to Admission medications    Medication Sig Start Date End Date Taking? Authorizing Provider  acetaminophen (TYLENOL) 650 MG CR tablet Take 650 mg by mouth every 4 (four) hours as needed for pain.    Yes [provider]  allopurinol (ZYLOPRIM) 300 MG tablet Take 1 tablet by mouth once daily 08/01/20  Yes Einar Pheasant, MD  amLODipine (NORVASC) 10 MG tablet TAKE 1 TABLET BY MOUTH DAILY 08/29/19  Yes Einar Pheasant, MD  aspirin 81 MG tablet Take 81 mg by mouth daily. Reported on 07/24/2015   Yes [provider]  atorvastatin (LIPITOR) 40 MG tablet Take 1 tablet (40 mg total) by mouth daily. 03/20/20  Yes Einar Pheasant, MD  benzonatate (TESSALON) 200 MG capsule Take 1 capsule (200 mg total) by mouth 2 (two) times daily as needed for cough. 08/30/20  Yes Rodriguez-Southworth, Sunday Spillers, PA-C  clopidogrel (PLAVIX) 75 MG tablet Take 1 tablet by mouth once daily with breakfast 03/06/20  Yes Lorretta Harp, MD  fexofenadine (ALLEGRA) 180 MG tablet Take 1 tablet (180 mg total) by mouth daily. 08/30/20  Yes Rodriguez-Southworth, Sunday Spillers, PA-C  folic acid (FOLVITE) 1 MG tablet Take 1 mg by mouth every evening.    Yes [provider]  glucose blood (BAYER CONTOUR TEST) test strip USE  STRIP TO CHECK GLUCOSE TWICE DAILY. Dx: E11.9 05/29/17  Yes Einar Pheasant, MD  losartan (COZAAR) 100 MG tablet Take 1 tablet by mouth once daily 07/10/20  Yes Einar Pheasant, MD  magnesium oxide (MAG-OX) 400 (241.3 Mg) MG tablet Take 1 tablet (400 mg total) by mouth 2 (two) times daily. 07/05/20  Yes Einar Pheasant, MD  metFORMIN (GLUCOPHAGE) 500 MG tablet Take 1 tablet by mouth once daily 07/04/20  Yes Einar Pheasant, MD  methotrexate (RHEUMATREX) 2.5 MG tablet TAKE 4 TABLETS BY MOUTH EVERY 7 DAYS. 01/10/20  Yes [provider]  metoprolol succinate (TOPROL-XL) 50 MG 24 hr tablet TAKE 1 TABLET BY MOUTH ONCE DAILY IMMEDIATLY  FOLLOWING  A  MEAL 06/11/20  Yes Einar Pheasant, MD  MICROLET LANCETS MISC Check sugar once daily,  Ascensia Microlet Lancets. Dx E11.9 10/02/14  Yes Einar Pheasant, MD  pantoprazole (PROTONIX) 40 MG tablet Take 1 tablet by mouth once daily 06/05/20  Yes Leone Haven, MD  spironolactone (ALDACTONE) 25 MG tablet Take 1 tablet by mouth once daily 08/01/20  Yes Einar Pheasant, MD    Family History Family History  Problem Relation Age of Onset  . Arthritis Mother   . Heart attack Father   . Colon cancer Neg Hx   . Breast cancer Neg Hx     Social History Social History   Tobacco Use  . Smoking status: Former Smoker    Packs/day: 0.50    Years: 15.00    Pack years: 7.50    Quit date: 05/12/1988    Years since quitting: 32.3  . Smokeless tobacco: Never Used  Vaping Use  . Vaping Use: Never used  Substance Use Topics  . Alcohol use: No    Alcohol/week: 0.0 standard drinks  .  Drug use: No     Allergies   Maxzide [triamterene-hctz]   Review of Systems Review of Systems  Constitutional: Positive for fatigue. Negative for appetite change, chills, diaphoresis and fever.  HENT: Positive for rhinorrhea and sneezing. Negative for congestion, ear discharge, ear pain and tinnitus.   Eyes: Negative for discharge.  Respiratory: Positive for cough. Negative for chest tightness, shortness of breath and wheezing.   Cardiovascular: Negative for chest pain and leg swelling.  Musculoskeletal: Negative for myalgias.  Skin: Negative for rash.  Neurological: Negative for headaches.     Physical Exam Triage Vital Signs ED Triage Vitals  Enc Vitals Group     BP 08/30/20 1207 138/65     Pulse Rate 08/30/20 1207 73     Resp 08/30/20 1207 18     Temp 08/30/20 1207 98.2 F (36.8 C)     Temp Source 08/30/20 1207 Oral     SpO2 08/30/20 1207 97 %     Weight 08/30/20 1206 220 lb (99.8 kg)     Height 08/30/20 1206 5\' 6"  (1.676 m)     Head Circumference --      Peak Flow --      Pain Score 08/30/20 1206 0     Pain Loc --      Pain Edu? --      Excl. in [place]? --    No data  found.  Updated Vital Signs BP 138/65 (BP Location: Left Arm)   Pulse 73   Temp 98.2 F (36.8 C) (Oral)   Resp 18   Ht 5\' 6"  (1.676 m)   Wt 220 lb (99.8 kg)   LMP 05/25/1988   SpO2 97%   BMI 35.51 kg/m   Visual Acuity Right Eye Distance:   Left Eye Distance:   Bilateral Distance:    Right Eye Near:   Left Eye Near:    Bilateral Near:     Physical Exam Alert pt NAD who seems nasally congested EYES- non icterus, mild watering, no purulent drainage NOSE- moderate mucosa congestion which is pale pink with clear mucous. No sinus tenderness TM- both gray and little dull, canals are normal PHARYNX- clear, clear drainage noted.  NECK- supple with no nodes LUNGS- with rhonchi on RUL HEART - RRR with no murmurs SKIN- non jaundiced, no rashes.    UC Treatments / Results  Labs (all labs ordered are listed, but only abnormal results are displayed) Labs Reviewed - No data to display  EKG   Radiology DG Chest 2 View  Result Date: 08/30/2020 CLINICAL DATA:  Cough.  History of COVID-19 in April of this year. EXAM: CHEST - 2 VIEW COMPARISON:  Single-view of the chest 08/31/2019. FINDINGS: Lungs clear. Heart size upper normal. Aortic atherosclerosis. No pneumothorax or pleural fluid. No acute bony abnormality. Scoliosis noted. IMPRESSION: No acute disease. Electronically Signed   By: Inge Rise M.D.   On: 08/30/2020 12:51    Procedures Procedures (including critical care time)  Medications Ordered in UC Medications - No data to display  Initial Impression / Assessment and Plan / UC Course  I have reviewed the triage vital signs and the nursing notes. Pertinent  imaging results that were available during my care of the patient were reviewed by me and considered in my medical decision making (see chart for details). Has post covid cough and allergic rhinitis. I placed her on Allegra and Tessalon perless as noted.  Final Clinical Impressions(s) / UC Diagnoses   Final  diagnoses:  Cough  Seasonal allergic rhinitis due to pollen     Discharge Instructions     The covid cough may last up to 2 months, as long as you dont get a fever. I believe some if it if allergies so I am placing your an allergy medication called Allegra.     ED Prescriptions    Medication Sig Dispense Auth. Provider   fexofenadine (ALLEGRA) 180 MG tablet Take 1 tablet (180 mg total) by mouth daily. 30 tablet Rodriguez-Southworth, Kalieb Freeland, PA-C   benzonatate (TESSALON) 200 MG capsule Take 1 capsule (200 mg total) by mouth 2 (two) times daily as needed for cough. 30 capsule Rodriguez-Southworth, Sunday Spillers, PA-C     PDMP not reviewed this encounter.   Shelby Mattocks, PA-C 08/30/20 1306

## 2020-08-30 NOTE — Discharge Instructions (Addendum)
The covid cough may last up to 2 months, as long as you dont get a fever. I believe some if it if allergies so I am placing your an allergy medication called Allegra.

## 2020-08-30 NOTE — ED Triage Notes (Signed)
Patient in today c/o continued cough from having covid on 08/20/20. Patient states her cough is worse when she lays down. Patient has taken OTC Vit C, D and Zinc. Patient has not tried any OTC medications for cough.

## 2020-09-10 ENCOUNTER — Other Ambulatory Visit: Payer: Self-pay

## 2020-09-10 ENCOUNTER — Encounter: Payer: Self-pay | Admitting: Emergency Medicine

## 2020-09-10 ENCOUNTER — Ambulatory Visit
Admission: EM | Admit: 2020-09-10 | Discharge: 2020-09-10 | Disposition: A | Discharge status: Home / Self Care | Payer: Medicare Other | Attending: Emergency Medicine | Admitting: Emergency Medicine

## 2020-09-10 DIAGNOSIS — R059 Cough, unspecified: Secondary | ICD-10-CM

## 2020-09-10 DIAGNOSIS — R0982 Postnasal drip: Secondary | ICD-10-CM | POA: Diagnosis not present

## 2020-09-10 DIAGNOSIS — J309 Allergic rhinitis, unspecified: Secondary | ICD-10-CM

## 2020-09-10 MED ORDER — IPRATROPIUM BROMIDE 0.06 % NA SOLN: 2.0000 | Freq: Four times a day (QID) | NASAL | 12 refills | Status: AC

## 2020-09-10 MED ORDER — BENZONATATE 200 MG PO CAPS: 200.0000 mg | ORAL_CAPSULE | Freq: Two times a day (BID) | ORAL | 0 refills | Status: DC | PRN

## 2020-09-10 MED ORDER — FEXOFENADINE HCL 180 MG PO TABS: 180.0000 mg | ORAL_TABLET | Freq: Every day | ORAL | 1 refills | Status: AC

## 2020-09-10 NOTE — ED Triage Notes (Signed)
Pt presents today accompanied by husband. She reports being seen here one month ago and dx with Covid. She returns today with cont'd sx of nasal congestion, cough and sorethroat. Denies fever.

## 2020-09-10 NOTE — Discharge Instructions (Addendum)
Take an over-the-counter antihistamine such as Allegra 180 mg once daily for control of your allergy symptoms.  You may also take this as 60 mg twice daily.  Perform sinus irrigation with a [employer]Med sinus rinse kit and distilled water 2-3 times a day to wash away pollen particles and mold spores which could be attributing to nasal congestion and mucus production.  Use the Atrovent nasal spray, 2 squirts up each nostril 4 times a day, as needed for nasal congestion and postnasal drip.  Return for reevaluation for any new or worsening symptoms.

## 2020-09-10 NOTE — ED Provider Notes (Signed)
[facility]    CSN: [id number] Arrival date & time: 09/10/20  0936      History   Chief Complaint Chief Complaint  Patient presents with  . Cough  . Nasal Congestion  . Sore Throat    HPI Nina Perez is a 80 y.o. female.   HPI   80 year old female here for evaluation of nasal congestion, sore throat, and cough.  Patient reports that she was diagnosed with COVID 1 month ago and she says these are continuation of her symptoms.  She states that she is having nasal congestion with a clear nasal discharge, earaches, and a productive cough for a pale yellow to clear sputum.  Her cough is worse at night and first thing in the morning.  She has not had a fever, shortness of breath or wheezing, or GI complaints.  Patient does report that she has allergies and takes cetirizine currently but does not state any improvement of her symptoms.  Past Medical History:  Diagnosis Date  . Barrett's esophagus   . Cancer ([place])    skin ca  . Diabetes mellitus ([place])   . Gout   . History of colon cancer    adenomatous polyps  . Hypercholesterolemia   . Hypertension   . Inflammatory arthritis   . Osteoarthritis    knees, spine  . Phlebitis    x2 (with pregnancy)  . 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 Pulmonary hypertension ([place])   . Reflux esophagitis   . Rheumatoid arthritis(714.0)    positive RF, FANA, RNP, negative CCP ab and anti DNA,.  neg anti-SCL 70  . 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 Premature beats 07/03/2020  . Dysuria 12/11/2019  . Adenomatous colon polyp 11/21/2019  . Phlebitis 11/21/2019  . Renal cyst 11/21/2019  . Swelling of lower extremity 10/26/2019  . Hypomagnesemia 06/03/2019  . CKD (chronic kidney disease) stage 3, GFR 30-59 ml/min (HCC) 06/02/2019  . 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 PAD (peripheral artery disease) ([place]) 05/29/2019  .  Critical lower limb ischemia ([place]) 05/16/2019  . Open wound of left foot 03/27/2019  . Decreased GFR 11/13/2018  . Bladder incontinence 11/13/2018  . Elevated TSH 11/13/2018  . Thrombocytopenia ([place]) 07/03/2018  . Acute right-sided thoracic back pain 06/16/2018  . 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 Pulmonary hypertension, mild ([place]) 03/04/2018  . Murmur 08/16/2017  . Lymphedema 03/04/2016  . Lesion of neck 12/15/2015  . Dizziness 12/15/2015  . Venous stasis dermatitis of right lower extremity 09/10/2015  . Varicose veins of right lower extremity with inflammation 09/10/2015  . Breast nodule 11/05/2014  . Health care maintenance 11/02/2014  . Obesity (BMI 30-39.9) 06/04/2014  . Osteoarthritis 02/27/2014  . Environmental allergies 01/15/2014  . Diabetes mellitus ([place]) 05/22/2012  . Hypertension 05/22/2012  . Hypercholesterolemia 05/22/2012  . Rheumatoid arthritis ([place]) 05/22/2012  . Barrett's esophagus 05/22/2012  . Gout 05/22/2012  . GERD (gastroesophageal reflux disease) 05/22/2012  . History of colon polyps 05/22/2012    Past Surgical History:  Procedure Laterality Date  . ABDOMINAL AORTOGRAM W/LOWER EXTREMITY Left 05/16/2019   Procedure: ABDOMINAL AORTOGRAM W/LOWER EXTREMITY;  Surgeon: Lorretta Harp, MD;  Location: [facility];  Service: Cardiovascular;  Laterality: Left;  . AMPUTATION TOE Left 06/02/2019   Procedure: AMPUTATION TOE;  Surgeon: Albertine Patricia, DPM;  Location: [facility];  Service:  Podiatry;  Laterality: Left;  . BREAST CYST ASPIRATION Left    neg  . INCISION AND DRAINAGE OF WOUND Left 06/07/2019   Procedure: IRRIGATION AND DEBRIDEMENT WOUND;  Surgeon: Albertine Patricia, DPM;  Location: [facility];  Service: Podiatry;  Laterality: Left;  . LOWER EXTREMITY ANGIOGRAPHY Left 06/03/2019   Procedure: Lower Extremity Angiography;  Surgeon: Katha Cabal, MD;  Location: [facility];  Service: Cardiovascular;  Laterality: Left;  .  PERIPHERAL VASCULAR BALLOON ANGIOPLASTY Left 05/16/2019   Procedure: PERIPHERAL VASCULAR BALLOON ANGIOPLASTY;  Surgeon: Lorretta Harp, MD;  Location: [facility];  Service: Cardiovascular;  Laterality: Left;  SFA  . VEIN LIGATION AND STRIPPING      OB History   No obstetric history on file.      Home Medications    Prior to Admission medications   Medication Sig Start Date End Date Taking? Authorizing Provider  amLODipine (NORVASC) 10 MG tablet TAKE 1 TABLET BY MOUTH DAILY 08/29/19  Yes Einar Pheasant, MD  atorvastatin (LIPITOR) 40 MG tablet Take 1 tablet (40 mg total) by mouth daily. 03/20/20  Yes Einar Pheasant, MD  clopidogrel (PLAVIX) 75 MG tablet Take 1 tablet by mouth once daily with breakfast 03/06/20  Yes Lorretta Harp, MD  ipratropium (ATROVENT) 0.06 % nasal spray Place 2 sprays into both nostrils 4 (four) times daily. 09/10/20  Yes Margarette Canada, NP  losartan (COZAAR) 100 MG tablet Take 1 tablet by mouth once daily 07/10/20  Yes Einar Pheasant, MD  acetaminophen (TYLENOL) 650 MG CR tablet Take 650 mg by mouth every 4 (four) hours as needed for pain.     [provider]  allopurinol (ZYLOPRIM) 300 MG tablet Take 1 tablet by mouth once daily 08/01/20   Einar Pheasant, MD  aspirin 81 MG tablet Take 81 mg by mouth daily. Reported on 07/24/2015    [provider]  benzonatate (TESSALON) 200 MG capsule Take 1 capsule (200 mg total) by mouth 2 (two) times daily as needed for cough. 09/10/20   Margarette Canada, NP  fexofenadine (ALLEGRA) 180 MG tablet Take 1 tablet (180 mg total) by mouth daily. 09/10/20   Margarette Canada, NP  folic acid (FOLVITE) 1 MG tablet Take 1 mg by mouth every evening.     [provider]  glucose blood (BAYER CONTOUR TEST) test strip USE  STRIP TO CHECK GLUCOSE TWICE DAILY. Dx: E11.9 05/29/17   Einar Pheasant, MD  magnesium oxide (MAG-OX) 400 (241.3 Mg) MG tablet Take 1 tablet (400 mg total) by mouth 2 (two) times daily. 07/05/20   Einar Pheasant, MD  metFORMIN (GLUCOPHAGE) 500 MG tablet Take 1 tablet by mouth once daily 07/04/20   Einar Pheasant, MD  methotrexate (RHEUMATREX) 2.5 MG tablet TAKE 4 TABLETS BY MOUTH EVERY 7 DAYS. 01/10/20   [provider]  metoprolol succinate (TOPROL-XL) 50 MG 24 hr tablet TAKE 1 TABLET BY MOUTH ONCE DAILY IMMEDIATLY  FOLLOWING  A  MEAL 06/11/20   Einar Pheasant, MD  MICROLET LANCETS MISC Check sugar once daily, [employer] Microlet Lancets. Dx E11.9 10/02/14   Einar Pheasant, MD  pantoprazole (PROTONIX) 40 MG tablet Take 1 tablet by mouth once daily 06/05/20   Leone Haven, MD  spironolactone (ALDACTONE) 25 MG tablet Take 1 tablet by mouth once daily 08/01/20   Einar Pheasant, MD    Family History Family History  Problem Relation Age of Onset  . Arthritis Mother   . Heart attack Father   .  Colon cancer Neg Hx   . Breast cancer Neg Hx     Social History Social History   Tobacco Use  . Smoking status: Former Smoker    Packs/day: 0.50    Years: 15.00    Pack years: 7.50    Quit date: 05/12/1988    Years since quitting: 32.3  . Smokeless tobacco: Never Used  Vaping Use  . Vaping Use: Never used  Substance Use Topics  . Alcohol use: No    Alcohol/week: 0.0 standard drinks  . Drug use: No     Allergies   Maxzide [triamterene-hctz]   Review of Systems Review of Systems  Constitutional: Negative for activity change, appetite change and fever.  HENT: Positive for congestion, ear pain, postnasal drip, rhinorrhea and sore throat.   Respiratory: Positive for cough. Negative for shortness of breath and wheezing.   Gastrointestinal: Negative for diarrhea, nausea and vomiting.  Musculoskeletal: Negative for arthralgias and myalgias.  Skin: Negative for rash.  Neurological: Negative for headaches.  Hematological: Negative.   Psychiatric/Behavioral: Negative.      Physical Exam Triage Vital Signs ED Triage Vitals  Enc Vitals Group     BP 09/10/20 1002 (!) 141/74      Pulse Rate 09/10/20 1002 78     Resp 09/10/20 1002 20     Temp 09/10/20 1002 98.2 F (36.8 C)     Temp Source 09/10/20 1002 Oral     SpO2 09/10/20 1002 97 %     Weight --      Height --      Head Circumference --      Peak Flow --      Pain Score 09/10/20 0959 0     Pain Loc --      Pain Edu? --      Excl. in [place]? --    No data found.  Updated Vital Signs BP (!) 141/74 (BP Location: Right Arm)   Pulse 78   Temp 98.2 F (36.8 C) (Oral)   Resp 20   LMP 05/25/1988   SpO2 97%   Visual Acuity Right Eye Distance:   Left Eye Distance:   Bilateral Distance:    Right Eye Near:   Left Eye Near:    Bilateral Near:     Physical Exam Vitals and nursing note reviewed.  Constitutional:      General: She is not in acute distress. HENT:     Head: Normocephalic and atraumatic.     Right Ear: Tympanic membrane and ear canal normal. No middle ear effusion. Tympanic membrane is not erythematous.     Left Ear: Tympanic membrane and ear canal normal.  No middle ear effusion. Tympanic membrane is not erythematous.     Nose: Congestion and rhinorrhea present.     Mouth/Throat:     Mouth: Mucous membranes are moist.     Pharynx: Oropharynx is clear. Uvula midline. Posterior oropharyngeal erythema present.     Tonsils: 0 on the right. 0 on the left.  Cardiovascular:     Rate and Rhythm: Normal rate and regular rhythm.     Heart sounds: Normal heart sounds. No murmur heard. No gallop.   Pulmonary:     Effort: Pulmonary effort is normal.     Breath sounds: Normal breath sounds. No wheezing, rhonchi or rales.  Musculoskeletal:     Cervical back: Normal range of motion and neck supple.  Skin:    General: Skin is warm and dry.  Capillary Refill: Capillary refill takes less than 2 seconds.     Findings: No erythema or rash.  Neurological:     General: No focal deficit present.     Mental Status: She is alert and oriented to person, place, and time.  Psychiatric:        Mood and  Affect: Mood normal.        Behavior: Behavior normal.      UC Treatments / Results  Labs (all labs ordered are listed, but only abnormal results are displayed) Labs Reviewed - No data to display  EKG   Radiology No results found.  Procedures Procedures (including critical care time)  Medications Ordered in UC Medications - No data to display  Initial Impression / Assessment and Plan / UC Course  I have reviewed the triage vital signs and the nursing notes.  Pertinent labs & imaging results that were available during my care of the patient were reviewed by me and considered in my medical decision making (see chart for details).   Patient is a very pleasant 81 year old female here for evaluation of nasal congestion with clear nasal discharge, cough that is productive for a clear to yellow sputum mostly in the mornings and at night, and bilateral earaches.  Patient does have allergies and takes cetirizine but does not report that it is helping her.  She is wondering if this is a continuation of COVID which she was diagnosed with 1 month ago considering her symptoms have been continuous.  Physical exam reveals pearly gray tympanic membranes bilaterally with a normal light reflex and clear external auditory canals.  Nasal mucosa is pale and edematous with clear nasal discharge.  Tonsillar pillars are unremarkable but posterior oropharynx does demonstrate erythema with clear postnasal drip.  No cervical lymphadenopathy on exam.  Cardiopulmonary exam is benign.  Patient symptoms are consistent with allergic rhinitis and I will have her switch her cetirizine to fexofenadine either 60 mg twice daily or 180 mg once daily and we will add on Atrovent nasal spray 2 squirts in each nostril 4 times a day as needed for nasal congestion.  I have also discussed pursuing sinus irrigation with the patient and will give her instructions on that.   Final Clinical Impressions(s) / UC Diagnoses   Final  diagnoses:  Allergic rhinitis with postnasal drip  Cough     Discharge Instructions     Take an over-the-counter antihistamine such as Allegra 180 mg once daily for control of your allergy symptoms.  You may also take this as 60 mg twice daily.  Perform sinus irrigation with a [employer] sinus rinse kit and distilled water 2-3 times a day to wash away pollen particles and mold spores which could be attributing to nasal congestion and mucus production.  Use the Atrovent nasal spray, 2 squirts up each nostril 4 times a day, as needed for nasal congestion and postnasal drip.  Return for reevaluation for any new or worsening symptoms.     ED Prescriptions    Medication Sig Dispense Auth. Provider   ipratropium (ATROVENT) 0.06 % nasal spray Place 2 sprays into both nostrils 4 (four) times daily. 15 mL Margarette Canada, NP   benzonatate (TESSALON) 200 MG capsule Take 1 capsule (200 mg total) by mouth 2 (two) times daily as needed for cough. 30 capsule Margarette Canada, NP   fexofenadine (ALLEGRA) 180 MG tablet Take 1 tablet (180 mg total) by mouth daily. 30 tablet Margarette Canada, NP     PDMP  not reviewed this encounter.   Margarette Canada, NP 09/10/20 1049

## 2020-09-15 ENCOUNTER — Other Ambulatory Visit: Payer: Self-pay | Admitting: Cardiovascular Disease

## 2020-09-15 ENCOUNTER — Other Ambulatory Visit: Payer: Self-pay | Admitting: Internal Medicine

## 2020-09-15 ENCOUNTER — Other Ambulatory Visit: Payer: Self-pay | Admitting: Family Medicine

## 2020-09-26 ENCOUNTER — Telehealth: Payer: Self-pay | Admitting: *Deleted

## 2020-09-26 DIAGNOSIS — N1831 Chronic kidney disease, stage 3a: Secondary | ICD-10-CM

## 2020-09-26 DIAGNOSIS — I10 Essential (primary) hypertension: Secondary | ICD-10-CM

## 2020-09-26 DIAGNOSIS — E7800 Pure hypercholesterolemia, unspecified: Secondary | ICD-10-CM

## 2020-09-26 DIAGNOSIS — E1159 Type 2 diabetes mellitus with other circulatory complications: Secondary | ICD-10-CM

## 2020-09-26 NOTE — Telephone Encounter (Signed)
Please place future orders for lab appt.  

## 2020-09-26 NOTE — Telephone Encounter (Signed)
Orders placed for labs

## 2020-09-28 ENCOUNTER — Other Ambulatory Visit: Payer: Self-pay

## 2020-09-28 ENCOUNTER — Other Ambulatory Visit (INDEPENDENT_AMBULATORY_CARE_PROVIDER_SITE_OTHER): Payer: Medicare Other

## 2020-09-28 DIAGNOSIS — I10 Essential (primary) hypertension: Secondary | ICD-10-CM | POA: Diagnosis not present

## 2020-09-28 DIAGNOSIS — E1159 Type 2 diabetes mellitus with other circulatory complications: Secondary | ICD-10-CM

## 2020-09-28 DIAGNOSIS — E7800 Pure hypercholesterolemia, unspecified: Secondary | ICD-10-CM | POA: Diagnosis not present

## 2020-09-28 LAB — CBC WITH DIFFERENTIAL/PLATELET
Basophils Absolute: 0 10*3/uL (ref 0.0–0.1)
Basophils Relative: 0.7 % (ref 0.0–3.0)
Eosinophils Absolute: 0 10*3/uL (ref 0.0–0.7)
Eosinophils Relative: 0.6 % (ref 0.0–5.0)
HCT: 39.1 % (ref 36.0–46.0)
Hemoglobin: 12.8 g/dL (ref 12.0–15.0)
Lymphocytes Relative: 12.1 % (ref 12.0–46.0)
Lymphs Abs: 0.6 10*3/uL — ABNORMAL LOW (ref 0.7–4.0)
MCHC: 32.7 g/dL (ref 30.0–36.0)
MCV: 103.1 fl — ABNORMAL HIGH (ref 78.0–100.0)
Monocytes Absolute: 0.4 10*3/uL (ref 0.1–1.0)
Monocytes Relative: 7.2 % (ref 3.0–12.0)
Neutro Abs: 3.9 10*3/uL (ref 1.4–7.7)
Neutrophils Relative %: 79.4 % — ABNORMAL HIGH (ref 43.0–77.0)
Platelets: 135 10*3/uL — ABNORMAL LOW (ref 150.0–400.0)
RBC: 3.79 Mil/uL — ABNORMAL LOW (ref 3.87–5.11)
RDW: 20.4 % — ABNORMAL HIGH (ref 11.5–15.5)
WBC: 4.9 10*3/uL (ref 4.0–10.5)

## 2020-09-28 LAB — BASIC METABOLIC PANEL
BUN: 25 mg/dL — ABNORMAL HIGH (ref 6–23)
CO2: 28 mEq/L (ref 19–32)
Calcium: 10 mg/dL (ref 8.4–10.5)
Chloride: 104 mEq/L (ref 96–112)
Creatinine, Ser: 1.21 mg/dL — ABNORMAL HIGH (ref 0.40–1.20)
GFR: 42.67 mL/min — ABNORMAL LOW (ref >60.00)
Glucose, Bld: 101 mg/dL — ABNORMAL HIGH (ref 70–99)
Potassium: 4.5 mEq/L (ref 3.5–5.1)
Sodium: 138 mEq/L (ref 135–145)

## 2020-09-28 LAB — HEPATIC FUNCTION PANEL
ALT: 29 U/L (ref 0–35)
AST: 34 U/L (ref 0–37)
Albumin: 3.5 g/dL (ref 3.5–5.2)
Alkaline Phosphatase: 88 U/L (ref 39–117)
Bilirubin, Direct: 0.2 mg/dL (ref 0.0–0.3)
Total Bilirubin: 0.7 mg/dL (ref 0.2–1.2)
Total Protein: 6.9 g/dL (ref 6.0–8.3)

## 2020-09-28 LAB — LIPID PANEL
Cholesterol: 87 mg/dL (ref 0–200)
HDL: 30.9 mg/dL — ABNORMAL LOW (ref >39.00)
LDL Cholesterol: 34 mg/dL (ref 0–99)
NonHDL: 55.92
Total CHOL/HDL Ratio: 3
Triglycerides: 108 mg/dL (ref 0.0–149.0)
VLDL: 21.6 mg/dL (ref 0.0–40.0)

## 2020-09-28 LAB — MICROALBUMIN / CREATININE URINE RATIO
Creatinine,U: 75 mg/dL
Microalb Creat Ratio: 3.6 mg/g (ref 0.0–30.0)
Microalb, Ur: 2.7 mg/dL — ABNORMAL HIGH (ref 0.0–1.9)

## 2020-09-28 LAB — HEMOGLOBIN A1C: Hgb A1c MFr Bld: 6.3 % (ref 4.6–6.5)

## 2020-10-02 ENCOUNTER — Other Ambulatory Visit: Payer: Self-pay

## 2020-10-02 ENCOUNTER — Ambulatory Visit (INDEPENDENT_AMBULATORY_CARE_PROVIDER_SITE_OTHER): Payer: Medicare Other | Admitting: Internal Medicine

## 2020-10-02 VITALS — BP 122/70 | HR 60 | Temp 97.9°F | Resp 16 | Ht 66.0 in | Wt 218.0 lb

## 2020-10-02 DIAGNOSIS — R944 Abnormal results of kidney function studies: Secondary | ICD-10-CM | POA: Diagnosis not present

## 2020-10-02 DIAGNOSIS — I739 Peripheral vascular disease, unspecified: Secondary | ICD-10-CM | POA: Diagnosis not present

## 2020-10-02 DIAGNOSIS — I70249 Atherosclerosis of native arteries of left leg with ulceration of unspecified site: Secondary | ICD-10-CM | POA: Diagnosis not present

## 2020-10-02 DIAGNOSIS — Z89422 Acquired absence of other left toe(s): Secondary | ICD-10-CM

## 2020-10-02 DIAGNOSIS — E7800 Pure hypercholesterolemia, unspecified: Secondary | ICD-10-CM

## 2020-10-02 DIAGNOSIS — M341 CR(E)ST syndrome: Secondary | ICD-10-CM

## 2020-10-02 DIAGNOSIS — D696 Thrombocytopenia, unspecified: Secondary | ICD-10-CM

## 2020-10-02 DIAGNOSIS — I10 Essential (primary) hypertension: Secondary | ICD-10-CM | POA: Diagnosis not present

## 2020-10-02 DIAGNOSIS — M069 Rheumatoid arthritis, unspecified: Secondary | ICD-10-CM

## 2020-10-02 DIAGNOSIS — I272 Pulmonary hypertension, unspecified: Secondary | ICD-10-CM

## 2020-10-02 DIAGNOSIS — N1831 Chronic kidney disease, stage 3a: Secondary | ICD-10-CM

## 2020-10-02 DIAGNOSIS — E1159 Type 2 diabetes mellitus with other circulatory complications: Secondary | ICD-10-CM

## 2020-10-02 DIAGNOSIS — Z0000 Encounter for general adult medical examination without abnormal findings: Secondary | ICD-10-CM

## 2020-10-02 LAB — CBC WITH DIFFERENTIAL/PLATELET
Basophils Absolute: 0.1 10*3/uL (ref 0.0–0.1)
Basophils Relative: 1.2 % (ref 0.0–3.0)
Eosinophils Absolute: 0.1 10*3/uL (ref 0.0–0.7)
Eosinophils Relative: 1.2 % (ref 0.0–5.0)
HCT: 40.2 % (ref 36.0–46.0)
Hemoglobin: 12.8 g/dL (ref 12.0–15.0)
Lymphocytes Relative: 17 % (ref 12.0–46.0)
Lymphs Abs: 0.9 10*3/uL (ref 0.7–4.0)
MCHC: 31.8 g/dL (ref 30.0–36.0)
MCV: 104.9 fl — ABNORMAL HIGH (ref 78.0–100.0)
Monocytes Absolute: 0.5 10*3/uL (ref 0.1–1.0)
Monocytes Relative: 8.8 % (ref 3.0–12.0)
Neutro Abs: 3.8 10*3/uL (ref 1.4–7.7)
Neutrophils Relative %: 71.8 % (ref 43.0–77.0)
Platelets: 152 10*3/uL (ref 150.0–400.0)
RBC: 3.83 Mil/uL — ABNORMAL LOW (ref 3.87–5.11)
RDW: 21.2 % — ABNORMAL HIGH (ref 11.5–15.5)
WBC: 5.3 10*3/uL (ref 4.0–10.5)

## 2020-10-02 LAB — BASIC METABOLIC PANEL
BUN: 26 mg/dL — ABNORMAL HIGH (ref 6–23)
CO2: 29 mEq/L (ref 19–32)
Calcium: 10.1 mg/dL (ref 8.4–10.5)
Chloride: 105 mEq/L (ref 96–112)
Creatinine, Ser: 1.22 mg/dL — ABNORMAL HIGH (ref 0.40–1.20)
GFR: 42.25 mL/min — ABNORMAL LOW (ref >60.00)
Glucose, Bld: 89 mg/dL (ref 70–99)
Potassium: 4.2 mEq/L (ref 3.5–5.1)
Sodium: 141 mEq/L (ref 135–145)

## 2020-10-02 NOTE — Progress Notes (Signed)
Patient ID: Nina Perez, female   DOB: January 10, 1941, 80 y.o.   MRN: [id number]   Subjective:    Patient ID: Nina Perez, female    DOB: 16-Jun-1940, 80 y.o.   MRN: [id number]  HPI This visit occurred during the SARS-CoV-2 public health emergency.  Safety protocols were in place, including screening questions prior to the visit, additional usage of staff PPE, and extensive cleaning of exam room while observing appropriate contact time as indicated for disinfecting solutions.  Patient with past history of hypertension, RA, CKD and hypercholesterolemia.  She comes in today to follow up on these issues as well as for a complete physical exam.  She is accompanied by her husband. History obtained from both of them.  Was diagnosed with covid 08/20/20.  Was seen 08/30/20 for f/u - persistent cough. Treated for allergies.  reevaluated 09/10/20 and treated with allegra and atrovent nasal spray.  She is using saline nasal spray.  Describes gets out of breath with getting dressed.  Oxygen saturation 95%.  No chest pain.  No increased cough currently.  She does have to get up every hour at night - to urinate.  Discussed concern regarding possible sleep apnea.  Has underlying ILD.  Continues on MTX.  Followed by pulmonary.  Eating.  No nausea or vomiting.  No abdominal pain.  Bowels moving.  Sees Dr Jefm Bryant for her RA.  Has OA of her knees.  More symptomatic.  Scheduled to see Dr Marry Guan 11/04/20.  Limits her activity.  Discussed labs and decreased GFR.    Past Medical History:  Diagnosis Date  . Barrett's esophagus   . Cancer ([place])    skin ca  . Diabetes mellitus ([place])   . Gout   . History of colon cancer    adenomatous polyps  . Hypercholesterolemia   . Hypertension   . Inflammatory arthritis   . Osteoarthritis    knees, spine  . Phlebitis    x2 (with pregnancy)  . 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 Pulmonary hypertension ([place])   . Reflux esophagitis   . Rheumatoid arthritis(714.0)    positive RF, FANA,  RNP, negative CCP ab and anti DNA,.  neg anti-SCL 70  . 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 ABDOMINAL AORTOGRAM W/LOWER EXTREMITY Left 05/16/2019   Procedure: ABDOMINAL AORTOGRAM W/LOWER EXTREMITY;  Surgeon: Lorretta Harp, MD;  Location: [facility];  Service: Cardiovascular;  Laterality: Left;  . AMPUTATION TOE Left 06/02/2019   Procedure: AMPUTATION TOE;  Surgeon: Albertine Patricia, DPM;  Location: [facility];  Service: Podiatry;  Laterality: Left;  . BREAST CYST ASPIRATION Left    neg  . INCISION AND DRAINAGE OF WOUND Left 06/07/2019   Procedure: IRRIGATION AND DEBRIDEMENT WOUND;  Surgeon: Albertine Patricia, DPM;  Location: [facility];  Service: Podiatry;  Laterality: Left;  . LOWER EXTREMITY ANGIOGRAPHY Left 06/03/2019   Procedure: Lower Extremity Angiography;  Surgeon: Katha Cabal, MD;  Location: [facility];  Service: Cardiovascular;  Laterality: Left;  . PERIPHERAL VASCULAR BALLOON ANGIOPLASTY Left 05/16/2019   Procedure: PERIPHERAL VASCULAR BALLOON ANGIOPLASTY;  Surgeon: Lorretta Harp, MD;  Location: [facility];  Service: Cardiovascular;  Laterality: Left;  SFA  . VEIN LIGATION AND STRIPPING     Family History  Problem Relation Age of Onset  . Arthritis Mother   . Heart attack Father   . Colon cancer Neg  Hx   . Breast cancer Neg Hx    Social History   Socioeconomic History  . Marital status: Married    Spouse name: Not on file  . Number of children: Not on file  . Years of education: Not on file  . Highest education level: Not on file  Occupational History  . Not on file  Tobacco Use  . Smoking status: Former Smoker    Packs/day: 0.50    Years: 15.00    Pack years: 7.50    Quit date: 05/12/1988    Years since quitting: 32.4  . Smokeless tobacco: Never Used  Vaping Use  . Vaping Use: Never used  Substance and Sexual Activity  . Alcohol use: No     Alcohol/week: 0.0 standard drinks  . Drug use: No  . Sexual activity: Yes  Other Topics Concern  . Not on file  Social History Narrative  . Not on file   Social Determinants of Health   Financial Resource Strain: Not on file  Food Insecurity: Not on file  Transportation Needs: Not on file  Physical Activity: Not on file  Stress: Not on file  Social Connections: Socially Integrated  . Frequency of Communication with Friends and Family: More than three times a week  . Frequency of Social Gatherings with Friends and Family: Not on file  . Attends Religious Services: More than 4 times per year  . Active Member of Clubs or Organizations: Yes  . Attends Archivist Meetings: More than 4 times per year  . Marital Status: Married    Outpatient Encounter Medications as of 10/02/2020  Medication Sig  . acetaminophen (TYLENOL) 650 MG CR tablet Take 650 mg by mouth every 4 (four) hours as needed for pain.   Marland Kitchen allopurinol (ZYLOPRIM) 300 MG tablet Take 1 tablet by mouth once daily  . amLODipine (NORVASC) 10 MG tablet TAKE 1 TABLET BY MOUTH DAILY  . aspirin 81 MG tablet Take 81 mg by mouth daily. Reported on 07/24/2015  . atorvastatin (LIPITOR) 40 MG tablet Take 1 tablet (40 mg total) by mouth daily.  . benzonatate (TESSALON) 200 MG capsule Take 1 capsule (200 mg total) by mouth 2 (two) times daily as needed for cough.  . clopidogrel (PLAVIX) 75 MG tablet Take 1 tablet by mouth once daily with breakfast  . fexofenadine (ALLEGRA) 180 MG tablet Take 1 tablet (180 mg total) by mouth daily.  . folic acid (FOLVITE) 1 MG tablet Take 1 mg by mouth every evening.   Marland Kitchen glucose blood (BAYER CONTOUR TEST) test strip USE  STRIP TO CHECK GLUCOSE TWICE DAILY. Dx: E11.9  . ipratropium (ATROVENT) 0.06 % nasal spray Place 2 sprays into both nostrils 4 (four) times daily.  Marland Kitchen losartan (COZAAR) 100 MG tablet Take 1 tablet by mouth once daily  . magnesium oxide (MAG-OX) 400 (241.3 Mg) MG tablet Take 1 tablet  (400 mg total) by mouth 2 (two) times daily.  . metFORMIN (GLUCOPHAGE) 500 MG tablet Take 1 tablet by mouth once daily  . methotrexate (RHEUMATREX) 2.5 MG tablet TAKE 4 TABLETS BY MOUTH EVERY 7 DAYS.  Marland Kitchen metoprolol succinate (TOPROL-XL) 50 MG 24 hr tablet TAKE 1 TABLET BY MOUTH ONCE DAILY IMMEDIATLY  FOLLOWING  A  MEAL  . MICROLET LANCETS MISC Check sugar once daily, [employer] Microlet Lancets. Dx E11.9  . pantoprazole (PROTONIX) 40 MG tablet Take 1 tablet by mouth once daily  . spironolactone (ALDACTONE) 25 MG tablet Take 1 tablet by mouth once  daily   No facility-administered encounter medications on file as of 10/02/2020.    Review of Systems  Constitutional: Positive for fatigue. Negative for appetite change and unexpected weight change.  HENT: Negative for congestion, sinus pressure and sore throat.   Eyes: Negative for pain and visual disturbance.  Respiratory: Negative for cough and chest tightness.        Some sob with increased activity.    Cardiovascular: Negative for chest pain and palpitations.       No increased swelling.   Gastrointestinal: Negative for abdominal pain, diarrhea, nausea and vomiting.  Genitourinary: Negative for difficulty urinating and dysuria.       Nocturia.   Musculoskeletal:       Knee pain as outlined.  Joints overall stable.  On MTX.   Skin: Negative for color change and rash.  Neurological: Negative for dizziness, light-headedness and headaches.  Hematological: Negative for adenopathy. Does not bruise/bleed easily.  Psychiatric/Behavioral: Negative for agitation and dysphoric mood.       Objective:    Physical Exam Vitals reviewed.  Constitutional:      General: She is not in acute distress.    Appearance: Normal appearance. She is well-developed.  HENT:     Head: Normocephalic and atraumatic.     Right Ear: External ear normal.     Left Ear: External ear normal.  Eyes:     General: No scleral icterus.       Right eye: No discharge.         Left eye: No discharge.     Conjunctiva/sclera: Conjunctivae normal.  Neck:     Thyroid: No thyromegaly.  Cardiovascular:     Rate and Rhythm: Normal rate and regular rhythm.  Pulmonary:     Effort: No tachypnea, accessory muscle usage or respiratory distress.     Breath sounds: Normal breath sounds. No decreased breath sounds or wheezing.  Chest:  Breasts:     Right: No inverted nipple, mass, nipple discharge or tenderness (no axillary adenopathy).     Left: No inverted nipple, mass, nipple discharge or tenderness (no axilarry adenopathy).    Abdominal:     General: Bowel sounds are normal.     Palpations: Abdomen is soft.     Tenderness: There is no abdominal tenderness.  Musculoskeletal:        General: No tenderness.     Cervical back: Neck supple. No tenderness.     Comments: No increased swelling.   Lymphadenopathy:     Cervical: No cervical adenopathy.  Skin:    Findings: No erythema or rash.  Neurological:     Mental Status: She is alert and oriented to person, place, and time.  Psychiatric:        Mood and Affect: Mood normal.        Behavior: Behavior normal.     BP 122/70   Pulse 60   Temp 97.9 F (36.6 C)   Resp 16   Ht '5\' 6"'  (1.676 m)   Wt 218 lb (98.9 kg)   LMP 05/25/1988   SpO2 98%   BMI 35.19 kg/m  Wt Readings from Last 3 Encounters:  10/02/20 218 lb (98.9 kg)  08/30/20 220 lb (99.8 kg)  07/03/20 224 lb (101.6 kg)     Lab Results  Component Value Date   WBC 5.3 10/02/2020   HGB 12.8 10/02/2020   HCT 40.2 10/02/2020   PLT 152.0 10/02/2020   GLUCOSE 89 10/02/2020   CHOL 87 09/28/2020  TRIG 108.0 09/28/2020   HDL 30.90 (L) 09/28/2020   LDLCALC 34 09/28/2020   ALT 29 09/28/2020   AST 34 09/28/2020   NA 141 10/02/2020   K 4.2 10/02/2020   CL 105 10/02/2020   CREATININE 1.22 (H) 10/02/2020   BUN 26 (H) 10/02/2020   CO2 29 10/02/2020   TSH 4.33 07/03/2020   INR 1.1 06/02/2019   HGBA1C 6.3 09/28/2020   MICROALBUR 2.7 (H) 09/28/2020        Assessment & Plan:   Problem List Items Addressed This Visit    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  Followed by [facility].       CKD (chronic kidney disease) stage 3, GFR 30-59 ml/min (HCC)    Avoid antiinflammatories.  Stay hydrated.  Recheck met b today - decreased GFR.       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       Decreased GFR - Primary   Relevant Orders   CBC with Differential/Platelet (Completed)   Basic metabolic panel (Completed)   Diabetes mellitus ([place])    On metformin.  Low carb diet and exercise.  Follow met b and a1c.       Health care maintenance    Physical today 10/02/20.  Mammogram - she had declined.  Colonoscopy 2014.       Hypercholesterolemia    Continue lipitor.  Low cholesterol diet and exercise.  Follow lipid panel and liver function tests.        Hypertension    Blood pressure doing well.  Continue metoprolol, losartan, spironolactone and amlodipine.  Follow pressures.  Follow metabolic panel.       PAD (peripheral artery disease) (HCC)    S/p angiogram, angioplasty and toe amputation.  Continue lipitor and plavix.  Follow.       Pulmonary hypertension, mild ([place])    Continue f/u with pulmonary.  With increased nocturia.  Discussed possible sleep apnea.  Schedule f/u with pulmonary to discuss.        Rheumatoid arthritis (HCC)    On MTX.  Followed by Dr Jefm Bryant.       Thrombocytopenia ([place])    Follow cbc.           Einar Pheasant, MD

## 2020-10-03 ENCOUNTER — Telehealth: Payer: Self-pay | Admitting: Internal Medicine

## 2020-10-03 ENCOUNTER — Other Ambulatory Visit: Payer: Self-pay | Admitting: Internal Medicine

## 2020-10-03 DIAGNOSIS — R944 Abnormal results of kidney function studies: Secondary | ICD-10-CM

## 2020-10-03 NOTE — Progress Notes (Signed)
Order placed for f/u met b and urinalysis.

## 2020-10-03 NOTE — Telephone Encounter (Signed)
Patient given results,

## 2020-10-03 NOTE — Telephone Encounter (Signed)
PT called to advise that she is returning the missed lab result call

## 2020-10-06 ENCOUNTER — Other Ambulatory Visit: Payer: Self-pay | Admitting: Internal Medicine

## 2020-10-08 ENCOUNTER — Encounter: Payer: Self-pay | Admitting: Internal Medicine

## 2020-10-08 DIAGNOSIS — Z89422 Acquired absence of other left toe(s): Secondary | ICD-10-CM | POA: Insufficient documentation

## 2020-10-08 NOTE — Assessment & Plan Note (Signed)
S/p amputation.  Followed by [facility].  

## 2020-10-08 NOTE — Assessment & Plan Note (Signed)
On MTX.  Followed by Dr Jefm Bryant.

## 2020-10-08 NOTE — Assessment & Plan Note (Addendum)
Continue f/u with pulmonary.  With increased nocturia.  Discussed possible sleep apnea.  Schedule f/u with pulmonary to discuss.

## 2020-10-08 NOTE — Assessment & Plan Note (Signed)
S/p angiogram, angioplasty and toe amputation.  Continue lipitor and plavix.  Follow.

## 2020-10-08 NOTE — Assessment & Plan Note (Signed)
Followed by Dr Kernodle.   

## 2020-10-08 NOTE — Assessment & Plan Note (Signed)
On metformin.  Low carb diet and exercise.  Follow met b and a1c.   

## 2020-10-08 NOTE — Assessment & Plan Note (Signed)
Blood pressure doing well.  Continue metoprolol, losartan, spironolactone and amlodipine.  Follow pressures.  Follow metabolic panel.

## 2020-10-08 NOTE — Assessment & Plan Note (Signed)
Follow cbc.  

## 2020-10-08 NOTE — Assessment & Plan Note (Signed)
Avoid antiinflammatories.  Stay hydrated.  Recheck met b today - decreased GFR.

## 2020-10-08 NOTE — Assessment & Plan Note (Signed)
Continue lipitor.  Low cholesterol diet and exercise.  Follow lipid panel and liver function tests.   

## 2020-10-08 NOTE — Assessment & Plan Note (Signed)
Physical today 10/02/20.  Mammogram - she had declined.  Colonoscopy 2014.

## 2020-10-10 ENCOUNTER — Other Ambulatory Visit (INDEPENDENT_AMBULATORY_CARE_PROVIDER_SITE_OTHER): Payer: Medicare Other

## 2020-10-10 ENCOUNTER — Other Ambulatory Visit: Payer: Self-pay

## 2020-10-10 DIAGNOSIS — R944 Abnormal results of kidney function studies: Secondary | ICD-10-CM

## 2020-10-10 LAB — URINALYSIS, ROUTINE W REFLEX MICROSCOPIC
Bilirubin Urine: NEGATIVE
Hgb urine dipstick: NEGATIVE
Ketones, ur: NEGATIVE
Nitrite: NEGATIVE
RBC / HPF: NONE SEEN (ref 0–?)
Specific Gravity, Urine: 1.01 (ref 1.000–1.030)
Total Protein, Urine: NEGATIVE
Urine Glucose: NEGATIVE
Urobilinogen, UA: 0.2 (ref 0.0–1.0)
pH: 6 (ref 5.0–8.0)

## 2020-10-10 LAB — BASIC METABOLIC PANEL
BUN: 24 mg/dL — ABNORMAL HIGH (ref 6–23)
CO2: 25 mEq/L (ref 19–32)
Calcium: 10.2 mg/dL (ref 8.4–10.5)
Chloride: 103 mEq/L (ref 96–112)
Creatinine, Ser: 1.08 mg/dL (ref 0.40–1.20)
GFR: 48.9 mL/min — ABNORMAL LOW (ref >60.00)
Glucose, Bld: 86 mg/dL (ref 70–99)
Potassium: 4.5 mEq/L (ref 3.5–5.1)
Sodium: 139 mEq/L (ref 135–145)

## 2020-10-14 ENCOUNTER — Other Ambulatory Visit: Payer: Self-pay | Admitting: Internal Medicine

## 2020-10-20 ENCOUNTER — Other Ambulatory Visit: Payer: Self-pay | Admitting: Internal Medicine

## 2020-11-19 ENCOUNTER — Ambulatory Visit (INDEPENDENT_AMBULATORY_CARE_PROVIDER_SITE_OTHER): Payer: Medicare Other

## 2020-11-19 VITALS — Ht 66.0 in | Wt 218.0 lb

## 2020-11-19 DIAGNOSIS — Z0000 Encounter for general adult medical examination without abnormal findings: Secondary | ICD-10-CM

## 2020-11-19 NOTE — Patient Instructions (Addendum)
Nina Perez , Thank you for taking time to come for your Medicare Wellness Visit. I appreciate your ongoing commitment to your health goals. Please review the following plan we discussed and let me know if I can assist you in the future.   These are the goals we discussed:  Goals       Patient Stated     Low carb diet (pt-stated)      Other     Follow up with pcp as needed        This is a list of the screening recommended for you and due dates:  Health Maintenance  Topic Date Due   Complete foot exam   10/24/2020   COVID-19 Vaccine (4 - Booster for Pfizer series) 12/05/2020*   Zoster (Shingles) Vaccine (1 of 2) 02/19/2021*   Mammogram  11/19/2021*   DEXA scan (bone density measurement)  11/19/2021*   Tetanus Vaccine  11/19/2021*   Hepatitis C Screening: USPSTF Recommendation to screen - Ages 18-79 yo.  11/19/2021*   Flu Shot  12/10/2020   Hemoglobin A1C  03/31/2021   Eye exam for diabetics  07/04/2021   Pneumonia vaccines  Completed   HPV Vaccine  Aged Out  *Topic was postponed. The date shown is not the original due date.     Advanced directives: not yet completed  Conditions/risks identified: patient reports she was directed to stop taking Spironolactone via Cardiology about 1 month ago. No fluid restrictions however her legs are now starting to hold fluid. Skin is tight to touch, no pitting edema, no weeping. Shoes fit appropriate. Requests appointment with pcp. None available before scheduled appointment in September. Patients asks if okay to resume medication or please call with appointment.   Follow up in one year for your annual wellness visit    Preventive Care 65 Years and Older, Female Preventive care refers to lifestyle choices and visits with your health care provider that can promote health and wellness. What does preventive care include? A yearly physical exam. This is also called an annual well check. Dental exams once or twice a year. Routine eye exams.  Ask your health care provider how often you should have your eyes checked. Personal lifestyle choices, including: Daily care of your teeth and gums. Regular physical activity. Eating a healthy diet. Avoiding tobacco and drug use. Limiting alcohol use. Practicing safe sex. Taking low-dose aspirin every day. Taking vitamin and mineral supplements as recommended by your health care provider. What happens during an annual well check? The services and screenings done by your health care provider during your annual well check will depend on your age, overall health, lifestyle risk factors, and family history of disease. Counseling  Your health care provider may ask you questions about your: Alcohol use. Tobacco use. Drug use. Emotional well-being. Home and relationship well-being. Sexual activity. Eating habits. History of falls. Memory and ability to understand (cognition). Work and work Statistician. Reproductive health. Screening  You may have the following tests or measurements: Height, weight, and BMI. Blood pressure. Lipid and cholesterol levels. These may be checked every 5 years, or more frequently if you are over 31 years old. Skin check. Lung cancer screening. You may have this screening every year starting at age 73 if you have a 30-pack-year history of smoking and currently smoke or have quit within the past 15 years. Fecal occult blood test (FOBT) of the stool. You may have this test every year starting at age 22. Flexible sigmoidoscopy or colonoscopy. You  may have a sigmoidoscopy every 5 years or a colonoscopy every 10 years starting at age 36. Hepatitis C blood test. Hepatitis B blood test. Sexually transmitted disease (STD) testing. Diabetes screening. This is done by checking your blood sugar (glucose) after you have not eaten for a while (fasting). You may have this done every 1-3 years. Bone density scan. This is done to screen for osteoporosis. You may have this  done starting at age 54. Mammogram. This may be done every 1-2 years. Talk to your health care provider about how often you should have regular mammograms. Talk with your health care provider about your test results, treatment options, and if necessary, the need for more tests. Vaccines  Your health care provider may recommend certain vaccines, such as: Influenza vaccine. This is recommended every year. Tetanus, diphtheria, and acellular pertussis (Tdap, Td) vaccine. You may need a Td booster every 10 years. Zoster vaccine. You may need this after age 56. Pneumococcal 13-valent conjugate (PCV13) vaccine. One dose is recommended after age 53. Pneumococcal polysaccharide (PPSV23) vaccine. One dose is recommended after age 64. Talk to your health care provider about which screenings and vaccines you need and how often you need them. This information is not intended to replace advice given to you by your health care provider. Make sure you discuss any questions you have with your health care provider. Document Released: 05/25/2015 Document Revised: 01/16/2016 Document Reviewed: 02/27/2015 Elsevier Interactive Patient Education  2017 [employer] Prevention in the Home Falls can cause injuries. They can happen to people of all ages. There are many things you can do to make your home safe and to help prevent falls. What can I do on the outside of my home? Regularly fix the edges of walkways and driveways and fix any cracks. Remove anything that might make you trip as you walk through a door, such as a raised step or threshold. Trim any bushes or trees on the path to your home. Use bright outdoor lighting. Clear any walking paths of anything that might make someone trip, such as rocks or tools. Regularly check to see if handrails are loose or broken. Make sure that both sides of any steps have handrails. Any raised decks and porches should have guardrails on the edges. Have any leaves,  snow, or ice cleared regularly. Use sand or salt on walking paths during winter. Clean up any spills in your garage right away. This includes oil or grease spills. What can I do in the bathroom? Use night lights. Install grab bars by the toilet and in the tub and shower. Do not use towel bars as grab bars. Use non-skid mats or decals in the tub or shower. If you need to sit down in the shower, use a plastic, non-slip stool. Keep the floor dry. Clean up any water that spills on the floor as soon as it happens. Remove soap buildup in the tub or shower regularly. Attach bath mats securely with double-sided non-slip rug tape. Do not have throw rugs and other things on the floor that can make you trip. What can I do in the bedroom? Use night lights. Make sure that you have a light by your bed that is easy to reach. Do not use any sheets or blankets that are too big for your bed. They should not hang down onto the floor. Have a firm chair that has side arms. You can use this for support while you get dressed. Do not have throw  rugs and other things on the floor that can make you trip. What can I do in the kitchen? Clean up any spills right away. Avoid walking on wet floors. Keep items that you use a lot in easy-to-reach places. If you need to reach something above you, use a strong step stool that has a grab bar. Keep electrical cords out of the way. Do not use floor polish or wax that makes floors slippery. If you must use wax, use non-skid floor wax. Do not have throw rugs and other things on the floor that can make you trip. What can I do with my stairs? Do not leave any items on the stairs. Make sure that there are handrails on both sides of the stairs and use them. Fix handrails that are broken or loose. Make sure that handrails are as long as the stairways. Check any carpeting to make sure that it is firmly attached to the stairs. Fix any carpet that is loose or worn. Avoid having throw  rugs at the top or bottom of the stairs. If you do have throw rugs, attach them to the floor with carpet tape. Make sure that you have a light switch at the top of the stairs and the bottom of the stairs. If you do not have them, ask someone to add them for you. What else can I do to help prevent falls? Wear shoes that: Do not have high heels. Have rubber bottoms. Are comfortable and fit you well. Are closed at the toe. Do not wear sandals. If you use a stepladder: Make sure that it is fully opened. Do not climb a closed stepladder. Make sure that both sides of the stepladder are locked into place. Ask someone to hold it for you, if possible. Clearly mark and make sure that you can see: Any grab bars or handrails. First and last steps. Where the edge of each step is. Use tools that help you move around (mobility aids) if they are needed. These include: Canes. Walkers. Scooters. Crutches. Turn on the lights when you go into a dark area. Replace any light bulbs as soon as they burn out. Set up your furniture so you have a clear path. Avoid moving your furniture around. If any of your floors are uneven, fix them. If there are any pets around you, be aware of where they are. Review your medicines with your doctor. Some medicines can make you feel dizzy. This can increase your chance of falling. Ask your doctor what other things that you can do to help prevent falls. This information is not intended to replace advice given to you by your health care provider. Make sure you discuss any questions you have with your health care provider. Document Released: 02/22/2009 Document Revised: 10/04/2015 Document Reviewed: 06/02/2014 Elsevier Interactive Patient Education  2017 [employer].

## 2020-11-19 NOTE — Progress Notes (Signed)
Received notification from [facility] that Nina Perez is having more problems with lower extremity swelling.  Per review, she saw Dr Laural Roes 11/14/20.  Per his note, swelling overall improved.  Please confirm with pt, if increased leg swelling.  Is swelling better in am?  Is she taking any fluid pill now?  Per note, off spironolactone.  Is she taking lasix, torsemide,etc?  She also sees cardiology.  Does she remember who stopped her spironolactone and why was stopped?

## 2020-11-19 NOTE — Progress Notes (Signed)
Subjective:   Nina Perez is a 80 y.o. female who presents for Medicare Annual (Subsequent) preventive examination.  Review of Systems    No ROS.  Medicare Wellness Virtual Visit.  Visual/audio telehealth visit, UTA vital signs.   See social history for additional risk factors.   Cardiac Risk Factors include: advanced age (>51men, >5 women);diabetes mellitus;hypertension     Objective:    Today's Vitals   11/19/20 1233  Weight: 218 lb (98.9 kg)  Height: 5\' 6"  (1.676 m)   Body mass index is 35.19 kg/m.  Advanced Directives 11/19/2020 11/17/2019 06/02/2019 06/01/2019 05/16/2019 05/16/2019 11/09/2018  Does Patient Have a Medical Advance Directive? No No No No No No No  Would patient like information on creating a medical advance directive? No - Patient declined No - Patient declined No - Patient declined No - Patient declined No - Patient declined No - Patient declined Yes (MAU/Ambulatory/Procedural Areas - Information given)    Current Medications (verified) Outpatient Encounter Medications as of 11/19/2020  Medication Sig   acetaminophen (TYLENOL) 650 MG CR tablet Take 650 mg by mouth every 4 (four) hours as needed for pain.    allopurinol (ZYLOPRIM) 300 MG tablet Take 1 tablet by mouth once daily   amLODipine (NORVASC) 10 MG tablet TAKE 1 TABLET BY MOUTH DAILY   aspirin 81 MG tablet Take 81 mg by mouth daily. Reported on 07/24/2015   atorvastatin (LIPITOR) 40 MG tablet Take 1 tablet by mouth once daily   benzonatate (TESSALON) 200 MG capsule Take 1 capsule (200 mg total) by mouth 2 (two) times daily as needed for cough.   clopidogrel (PLAVIX) 75 MG tablet Take 1 tablet by mouth once daily with breakfast   fexofenadine (ALLEGRA) 180 MG tablet Take 1 tablet (180 mg total) by mouth daily.   folic acid (FOLVITE) 1 MG tablet Take 1 mg by mouth every evening.    glucose blood (BAYER CONTOUR TEST) test strip USE  STRIP TO CHECK GLUCOSE TWICE DAILY. Dx: E11.9   ipratropium (ATROVENT) 0.06 %  nasal spray Place 2 sprays into both nostrils 4 (four) times daily.   losartan (COZAAR) 100 MG tablet Take 1 tablet by mouth once daily   magnesium oxide (MAG-OX) 400 (241.3 Mg) MG tablet Take 1 tablet (400 mg total) by mouth 2 (two) times daily.   metFORMIN (GLUCOPHAGE) 500 MG tablet Take 1 tablet by mouth once daily   methotrexate (RHEUMATREX) 2.5 MG tablet TAKE 4 TABLETS BY MOUTH EVERY 7 DAYS.   metoprolol succinate (TOPROL-XL) 50 MG 24 hr tablet TAKE 1 TABLET BY MOUTH ONCE DAILY IMMEDIATLY  FOLLOWING  A  MEAL   MICROLET LANCETS MISC Check sugar once daily, [employer] Microlet Lancets. Dx E11.9   pantoprazole (PROTONIX) 40 MG tablet Take 1 tablet by mouth once daily   spironolactone (ALDACTONE) 25 MG tablet Take 1 tablet by mouth once daily   No facility-administered encounter medications on file as of 11/19/2020.    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, sclerodactyly, telangiectasias   Past Surgical History:  Procedure Laterality Date   ABDOMINAL AORTOGRAM W/LOWER EXTREMITY Left 05/16/2019   Procedure: ABDOMINAL AORTOGRAM W/LOWER EXTREMITY;  Surgeon: Lorretta Harp, MD;  Location: [facility];  Service: Cardiovascular;  Laterality: Left;   AMPUTATION TOE Left 06/02/2019   Procedure: AMPUTATION TOE;  Surgeon: Albertine Patricia, DPM;  Location: [facility];  Service: Podiatry;  Laterality: Left;   BREAST CYST ASPIRATION Left     neg   INCISION AND DRAINAGE OF WOUND Left 06/07/2019   Procedure: IRRIGATION AND DEBRIDEMENT WOUND;  Surgeon: Albertine Patricia, DPM;  Location: [facility];  Service: Podiatry;  Laterality: Left;   LOWER EXTREMITY ANGIOGRAPHY Left 06/03/2019   Procedure: Lower Extremity Angiography;  Surgeon: Katha Cabal, MD;  Location: [facility];  Service: Cardiovascular;  Laterality: Left;   PERIPHERAL VASCULAR BALLOON ANGIOPLASTY Left 05/16/2019   Procedure: PERIPHERAL VASCULAR BALLOON ANGIOPLASTY;  Surgeon: Lorretta Harp, MD;  Location: [facility];  Service: Cardiovascular;  Laterality: Left;  SFA   VEIN LIGATION AND STRIPPING     Family History  Problem Relation Age of Onset   Arthritis Mother    Heart attack Father    Colon cancer Neg Hx    Breast cancer Neg Hx    Social History   Socioeconomic History   Marital status: Married    Spouse name: Not on file   Number of children: Not on file   Years of education: Not on file   Highest education level: Not on file  Occupational History   Not on file  Tobacco Use   Smoking status: Former    Packs/day: 0.50    Years: 15.00    Pack years: 7.50    Types: Cigarettes    Quit date: 05/12/1988    Years since quitting: 32.5   Smokeless tobacco: Never  Vaping Use   Vaping Use: Never used  Substance and Sexual Activity   Alcohol use: No    Alcohol/week: 0.0 standard drinks   Drug use: No   Sexual activity: Yes  Other Topics Concern   Not on file  Social History Narrative   Not on file   Social Determinants of Health   Financial Resource Strain: Low Risk    Difficulty of Paying Living Expenses: Not hard at all  Food Insecurity: No Food Insecurity   Worried About Charity fundraiser in the Last Year: Never true   [place] in the Last Year: Never true  Transportation Needs: No Transportation Needs   Lack of Transportation (Medical): No   Lack of Transportation (Non-Medical): No  Physical Activity: Not on  file  Stress: No Stress Concern Present   Feeling of Stress : Not at all  Social Connections: Socially Integrated   Frequency of Communication with Friends and Family: More than three times a week   Frequency of Social Gatherings with Friends and Family: Not on file   Attends Religious Services: More than 4 times per year   Active Member of [employer] or Organizations: Yes   Attends Music therapist: More than 4 times per year   Marital Status: Married    Tobacco Counseling Counseling given: Not Answered   Clinical Intake:  Pre-visit preparation completed: Yes  Diabetes: Yes (Followed by pcp)  Nutrition Risk Assessment: Does the patient have any non-healing wounds?  No  Has the patient had any unintentional weight loss or weight gain?  No   Diabetes: Was a CBG obtained today?  No  How often do you monitor your CBG's? She tries to check it daily. Not yet checked today.   Financial Strains and Diabetes Management: Are you having any financial strains with the device, your supplies or your medication? No .  Does the patient want to be seen by Chronic Care Management for management of their diabetes?  No  Would the patient like to be referred to a Nutritionist or for Diabetic Management?  No   Interpreter Needed?: No      Activities of Daily Living In your present state of health, do you have any difficulty performing the following activities: 11/19/2020  Hearing? N  Vision? N  Difficulty concentrating or making decisions? N  Walking or climbing stairs? Y  Comment Chronic knee pain. Followed by pcp  Dressing or bathing? N  Doing errands, shopping? N  Preparing Food and eating ? N  Using the Toilet? N  In the past six months, have you accidently leaked urine? Y  Comment Managed by Urology and PCP. Wears daily pad.  Do you have problems with loss of bowel control? N  Managing your Medications? N  Managing your Finances? Y  Comment Husband assist   Housekeeping or managing your Housekeeping? N  Some recent data might be hidden    Patient Care Team: Einar [place], MD as PCP - General (Internal Medicine) Lorretta Harp, MD as PCP - Cardiology (Cardiology)  Indicate any recent Medical Services you may have received from other than [facility] providers in the past year (date may be approximate).     Assessment:   This is a routine wellness examination for Nina Perez.  I connected with Nina Perez today by telephone and verified that I am speaking with the correct person using two identifiers. Location patient: home Location provider: work Persons participating in the virtual visit: patient, Marine scientist.    I discussed the limitations, risks, security and privacy concerns of performing an evaluation and management service by telephone and the availability of in person appointments. The patient expressed understanding and verbally consented to this telephonic visit.    Interactive audio and video telecommunications were attempted between this provider and patient, however failed, due to patient having technical difficulties OR patient did not have access to video capability.  We continued and completed visit with audio only.  Some vital signs may be absent or patient reported.   Hearing/Vision screen Hearing Screening - Comments:: Patient is able to hear conversational tones without difficulty.  No issues reported. Vision Screening - Comments:: Wears corrective lenses Visual acuity not assessed, virtual visit.  They have seen their ophthalmologist in the last 12 months.    Dietary issues and exercise activities discussed: Current Exercise Habits: Home exercise routine, Intensity: MildShe tries to have a healthy diet with good fluid intake.    Goals Addressed             This Visit's Progress    Follow up with pcp as needed         Depression Screen PHQ 2/9 Scores 11/19/2020 10/02/2020 11/17/2019 11/09/2018 11/06/2017 12/08/2016 04/14/2016   PHQ - 2 Score 0 0 0 1 0 0 0    Fall Risk Fall Risk  11/19/2020 10/02/2020 11/17/2019 11/09/2018 11/06/2017  Falls in the past year?  1 1 0 0 No  Comment - - - - -  Number falls in past yr: - 0 0 - -  Injury with Fall? - 0 - - -  Follow up - Falls evaluation completed Falls evaluation completed - -    FALL RISK PREVENTION PERTAINING TO THE HOME: Handrails in use when climbing stairs? Yes Home free of loose throw rugs in walkways, pet beds, electrical cords, etc? Yes  Adequate lighting in your home to reduce risk of falls? Yes   ASSISTIVE DEVICES UTILIZED TO PREVENT FALLS: Use of a cane, walker or w/c? Yes   TIMED UP AND GO: Was the test performed? No .   Cognitive Function: Patient is alert and oriented x3.  Denies difficulty focusing, making decisions, memory loss.  MMSE/6CIT deferred. Normal by direct observation/communication.  MMSE - Mini Mental State Exam 11/06/2017 03/20/2016  Not completed: - Unable to complete  Orientation to time 5 -  Orientation to Place 5 -  Registration 3 -  Attention/ Calculation 5 -  Recall 2 -  Language- name 2 objects 2 -  Language- repeat 1 -  Language- follow 3 step command 3 -  Language- read & follow direction 1 -  Write a sentence 1 -  Copy design 1 -  Total score 29 -     6CIT Screen 11/17/2019 11/09/2018  What Year? 0 points 0 points  What month? 0 points 0 points  What time? - 0 points  Count back from 20 - 0 points  Months in reverse 0 points 0 points  Repeat phrase 0 points -    Immunizations Immunization History  Administered Date(s) Administered   Fluad Quad(high Dose 65+) 02/07/2019   Influenza Split 02/09/2013   Influenza Whole 02/18/2017   Influenza, High Dose Seasonal PF 04/14/2016, 02/23/2018, 02/29/2020   Influenza,inj,Quad PF,6+ Mos 02/28/2014, 02/01/2015   Influenza-Unspecified 02/20/2012   PFIZER(Purple Top)SARS-COV-2 Vaccination 07/07/2019, 07/28/2019, 02/13/2020   Pneumococcal Conjugate-13 09/06/2013    Pneumococcal Polysaccharide-23 06/12/2017    TDAP status: Due, Education has been provided regarding the importance of this vaccine. Advised may receive this vaccine at local pharmacy or Health Dept. Aware to provide a copy of the vaccination record if obtained from local pharmacy or Health Dept. Verbalized acceptance and understanding. Deferred.   Covid vaccine - 3 completed.   Shingrix vaccine- Due, Education has been provided regarding the importance of this vaccine. Advised may receive this vaccine at local pharmacy or Health Dept. Aware to provide a copy of the vaccination record if obtained from local pharmacy or Health Dept. Verbalized acceptance and understanding. Deferred.   Health Maintenance Health Maintenance  Topic Date Due   FOOT EXAM  10/24/2020   COVID-19 Vaccine (4 - Booster for Pfizer series) 12/05/2020 (Originally 05/15/2020)   Zoster Vaccines- Shingrix (1 of 2) 02/19/2021 (Originally 05/22/1959)   MAMMOGRAM  11/19/2021 (Originally 03/04/2019)   DEXA SCAN  11/19/2021 (Originally 05/22/2006)   TETANUS/TDAP  11/19/2021 (Originally 05/22/1959)   Hepatitis C Screening  11/19/2021 (Originally 05/23/1958)   INFLUENZA VACCINE  12/10/2020   HEMOGLOBIN A1C  03/31/2021   OPHTHALMOLOGY EXAM  07/04/2021   PNA vac Low Risk Adult  Completed   HPV VACCINES  Aged Out   Mammogram- declined.   Bone density- declined.   DG Chest 2 View: completed 08/30/20  Hepatitis C Screening: does not qualify  Vision Screening: Recommended annual ophthalmology exams for early detection of glaucoma and other disorders of the eye.   Dental Screening: Recommended annual dental  exams for proper oral hygiene  Community Resource Referral / Chronic Care Management: CRR required this visit?  No   CCM required this visit?  No      Plan:   Keep all routine maintenance appointments.   I have personally reviewed and noted the following in the patient's chart:   Medical and social history Use of  alcohol, tobacco or illicit drugs  Current medications and supplements including opioid prescriptions. Patient is not currently taking opioid.  Functional ability and status Nutritional status Physical activity Advanced directives List of other physicians Hospitalizations, surgeries, and ER visits in previous 12 months Vitals Screenings to include cognitive, depression, and falls Referrals and appointments  In addition, I have reviewed and discussed with patient certain preventive protocols, quality metrics, and best practice recommendations. A written personalized care plan for preventive services as well as general preventive health recommendations were provided to patient.     Varney Biles, LPN   [id number]/65/4650   Nurse Notes: patient reports she was directed to stop taking Spironolactone via Cardiology about 1 month ago. No fluid restrictions however her legs are now starting to hold fluid. Skin is tight to touch, no pitting edema, no weeping. Shoes fit appropriate. Requests appointment with pcp. None available before scheduled appointment in September. Patients asks if okay to resume medication or please call with appointment. Deferred to pcp.

## 2020-11-20 NOTE — Progress Notes (Signed)
Per patient leg swelling starts Bilaterally in the morning upon rising by the time she gets a shower her feet and legs feel tight. Swelling does go mostly down at night but not entirely. Patient is taking no diuretics, patient stated Dr. Nehemiah Massed took her off the Spironolactone before her stress test and advised her to stay off the spironolactone. Patient has FU with Dr. Nehemiah Massed on 11/26/20 advised patient to inform cardiology of legs swelling as well. Patient denies any shortness of breath, just that her legs do get really tight since stopping the spironolactone.

## 2020-11-20 NOTE — Progress Notes (Signed)
Given that Dr Nehemiah Massed stopped her spironolactone, I agree with calling him and making him aware of her increased swelling since stopping the medication.  Would recommend calling prior to her 11/26/20 appt.  Let me know if she feels she needs to be seen prior to his appt.

## 2020-11-22 ENCOUNTER — Telehealth: Payer: Self-pay

## 2020-11-22 NOTE — Progress Notes (Signed)
I called and spoke with the receptionist and she is going to pass the message backto the provider, I gave her the number to reach me if anything is needed from [place]. Manveer Gomes,cma

## 2020-11-22 NOTE — Progress Notes (Signed)
Please notify pt as well.  Thank you.  Confirm ok to wait for appt.

## 2020-11-23 NOTE — Telephone Encounter (Signed)
Spoken to patient, since she is seeing other provider she stated she can wait until the appointment. She feels ok to do so.

## 2020-11-23 NOTE — Telephone Encounter (Signed)
I called Dr Alveria Apley office.  Discussed that I had been prescribing spironolactone.  Pt has been on this medication.  Ms Lanza had informed me that Dr Nehemiah Massed had stopped the medication prior to her stress test.  Spoke to [place].  Not aware of stopping or reason stopping.  Has an appt with Dr Nehemiah Massed in a few days.  He plans to discuss this with her more.  Please confirm if she is ok to wait until appt with him to discuss. If not, let me know.

## 2020-11-23 NOTE — Telephone Encounter (Signed)
LMTCB

## 2020-11-23 NOTE — Progress Notes (Signed)
Left message to call back  

## 2020-11-26 NOTE — Progress Notes (Signed)
 Established Patient Visit   Chief Complaint: Chief Complaint  Patient presents with  . Ankle Swelling    BIL;Occas   . Foot Swelling    BIL;Occas   . Leg Swelling    BIL;Occas   . Abnormal ECG   Date of Service: 11/26/2020 Date of Birth: 1940/07/04 PCP: Glendia Allena RAMAN, MD  History of Present Illness: Nina Perez is a 80 y.o.female patient  Shortness Of Breath/Dyspnea The patient is having difficulty with chronic dyspnea and or shortness of breath worsening with increased severity over the last 6 months. This occurs with mild exertion and limits ADLs and is also associated with walking and relieved by rest with a duration that is intermittent (1-10 minutes). Other related symptoms include fatigue. The patient has risk factors of this occurrence including age, postmenopausal female, HTN, Hyperlipidemia, sedentary life style and knee trouble. The source of these symptoms and investigation should include hypertension and valve disease  Abnormal ECG The patient has an EKG that has been reported to be abnormal.  The time period of this abnormality has been months.  The patient additionally has had symptoms including shortness of breath NSR and LBBB and not changed Stress Test Persantine SESTAMIBI was performed showing Normal test. with normal EF of 57% Hypertension with Intensive Control   The patient has been diagnosed with hypertension as well as one other cardiovascular risk factor.  We have discussed the SPRINT trial which shows that intensive blood pressure control in hypertensive adults at high cardiovascular risk adds a mean of 6 months to 3 years of life expectancy in an age dependent fashion, compared with older target standard blood pressure control.  The SPRINT trial randomized [id number] hypertensive patients aged 42 years or older with at least one additional cardiovascular risk factor to intensive control with a target systolic blood pressure of less than 120 mmHg versus older standards of  140 mmHg.  The trial was stopped early due to ethical reasons when the interim analysis showed intensive control was associated with a 27% reduction in mortality.  This is now been reconfirmed with actuarial data as well as the full final analysis in 2021.  Therefore, we will continue to press for higher intensive hypertension therapy for better long-term morbidity mortality results. Mixed Hyperlipidemia The patient has cardiovascular risk factors including Vascular disease, Diabetes, High LDL cholesterol and greater than 7.5% 10 year cardiovascular risk score and therefore has been placed on High intensity therapy with atorvastatin  (Lipitor ) for reduction in LDL and cardiovascular risk for which we have discussed today. Other healthy lifestyle measures have been discussed as well. We have discussed the appropriate treatment goals of the above measures which include a 30% to 50% reduction in LDL levels.  A significant component of residual cardiovascular risk is in adequate LDL C lowering in the large majority of patients.  Additionally ultralow LDL-C is safe with no apparent downside.  The patient has a clear understanding of reasons for lipid management at this time. The patient is currently having no evidence of significant side effects of this medication at this time. Lower Extremity Edema The patient has been concerned about chronic Trace bilateral lower extremity and ankle edema. This has been a concern for the last 6 months stable. This has been worsened by venous insufficiency and dietary indiscretion. Relief has come with diuretics, support stockings, elevation of involved area, low-salt diet  Preoperative Assessment Profile Risk factors for cardiac complication with orthopedic surgery: Positive for: Diabetes, Coronary artery disease and peripheral vascular  disease Negative for: Cardiomyopathy or chf and Chronic Kidney disease Active cardiovascular conditions: Positive qnm:wnwz Negative for:  Angina or anginal equivalent, Recent infartion, Congestive heart failure symptoms, Dysrhythmia and Symptomatic valve disease Functional capacity <4 Mets Risk of surgery and/or procedure low Possible need and adjustments in therapy prior to surgery and/or procedure no Overall risk of cardiac complication with surgery and/or procedure is low, <1%  Results for orders placed or performed in visit on 11/06/20  X-ray knee right 3 views   Narrative   I ordered and interpreted standing AP, lateral, and sunrise radiographs of  the right knee that were obtained in the office today. There is narrowing  of the lateral cartilage space with associated valgus alignment.   Osteophyte formation is noted.  Subchondral sclerosis is noted.  Degenerative changes to the patellofemoral articulation are noted. No  evidence of fracture or dislocation.            Past Medical and Surgical History  Past Medical History Past Medical History:  Diagnosis Date  . Adenomatous colon polyp, unspecified   . Chicken pox   . COVID-19 08/2020  . Diabetes mellitus type 2, uncomplicated (CMS-HCC)   . GERD (gastroesophageal reflux disease)   . Gout   . History of Barrett's esophagus   . Hypercholesterolemia   . Hypertension   . Obesity   . Osteoarthritis ([facility]) 02/27/2014     a.  Knees.   b.  Spine.    . Phlebitis    x 2 with pregnancies  . Pulmonary fibrosis (CMS-HCC)   . Pulmonary hypertension (CMS-HCC)   . 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  Raynaud's.   b.  Sclerodactyly, telangiectasias   c.  Positive rheumatoid factor, FANA, RNP.  Negative anti-CCP antibody and anti-DNA.  Negative anti-SCL 70.    d.  Inflammatory arthritis.    e.  Mild pulmonary fibrosis    f.  Pulmonary hypertension      Past Surgical History She has a past surgical history that includes Colonoscopy (08/20/2004); egd (05/14/2012, 02/02/2008, 08/20/2004, 01/15/1999); Colonoscopy (05/14/2012, 02/02/2008);  WISDOM TEETH; LEFT FOURTH TOE AMPUTATION (Left); and VASCULAR SURGERY BILATERAL LEG.   Medications and Allergies  Current Medications  Current Outpatient Medications on File Prior to Visit  Medication Sig Dispense Refill  . acetaminophen  (TYLENOL ) 325 MG tablet Take 650 mg by mouth every 4 (four) hours as needed for Pain.    . allopurinol  (ZYLOPRIM ) 300 MG tablet Take 300 mg by mouth once daily.    . amLODIPine  (NORVASC ) 10 MG tablet Take 10 mg by mouth once daily.    . aspirin  81 MG chewable tablet Take 81 mg by mouth once daily.    . atorvastatin  (LIPITOR ) 40 MG tablet Take 40 mg by mouth once daily       . clopidogreL  (PLAVIX ) 75 mg tablet Take 75 mg by mouth daily with breakfast    . fexofenadine  (ALLEGRA ) 60 MG tablet Take 60 mg by mouth 2 (two) times daily    . folic acid  (FOLVITE ) 1 MG tablet Take 1 tablet by mouth once daily 90 tablet 0  . FUROsemide (LASIX) 20 MG tablet Take 1 tablet (20 mg total) by mouth once daily 7 tablet 0  . losartan  (COZAAR ) 100 MG tablet Take 100 mg by mouth once daily    . magnesium  oxide (MAG-OX) 400 mg (241.3 mg magnesium ) tablet Take 1 tablet by mouth 2 (two) times daily    . metFORMIN  (GLUCOPHAGE ) 500  MG tablet Take 500 mg by mouth once daily.     . methotrexate  (RHEUMATREX) 2.5 MG tablet Take 4 tablets (10 mg total) by mouth every 7 (seven) days 16 tablet 5  . metoprolol  succinate (TOPROL -XL) 50 MG XL tablet Take 50 mg by mouth once daily.    . miscellaneous medical supply Misc INCENTIVE SPIROMETER - USE AS DIRECTED 1 each 1  . pantoprazole  (PROTONIX ) 40 MG DR tablet Take 40 mg by mouth once daily     No current facility-administered medications on file prior to visit.    Allergies: Maxzide [triamterene-hydrochlorothiazid]  Social and Family History  Social History  reports that she quit smoking about 32 years ago. Her smoking use included cigarettes. She quit after 29.00 years of use. She has never used smokeless tobacco. She reports that she  does not drink alcohol and does not use drugs.  Family History Family History  Problem Relation Age of Onset  . Myocardial Infarction (Heart attack) Father   . Myocardial Infarction (Heart attack) Mother   . Arthritis Mother   . No Known Problems Sister   . No Known Problems Brother     Review of Systems   Review of Systems  Positive for sob  Negative for weight loss, weakness, vision change, hearing loss,   congestion, PND, orthopnea, heartburn, nausea, diaphoresis, vomiting, diarrhea, bloody stool, melena, stomach pain, extremity pain, leg weakness, leg cramping, leg blood clots,  blackouts, nosebleed, trouble swallowing, mouth pain, urinary frequency, urination at night, muscle weakness, skin lesions, skin rashes, tingling ,ulcers, numbness, anxiety, and/or depression Physical Examination   Vitals:BP 130/72 (BP Location: Left upper arm, Patient Position: Sitting, BP Cuff Size: Large Adult)   Pulse 71   Resp 15   Ht 167.6 cm (5' 6)   Wt (!) 102.3 kg (225 lb 9.6 oz)   SpO2 97%   BMI 36.41 kg/m  Ht:167.6 cm (5' 6) Wt:(!) 102.3 kg (225 lb 9.6 oz) ADJ:Anib surface area is 2.18 meters squared. Body mass index is 36.41 kg/m. Appearance: well appearing in no acute distress HEENT: Pupils equally reactive to light and accomodation, no xanthalasma   Neck: Supple, no apparent thyromegaly, masses, or lymphadenopathy  Lungs: normal respiratory effort; no crackles, no rhonchi, no wheezes Heart: Regular rate and rhythm. Normal S1 S2  No gallops, 2-3+rusb murmur, no rub, PMI is normal size and placement. carotid upstroke normal without bruit. Jugular venous pressure is normal Abdomen: soft, nontender, not distended with normal bowel sounds. No apparent hepatosplenomegally. Abdominal aorta is normal size without bruit Extremities: trace  edema, no ulcers, no clubbing, no cyanosis Peripheral Pulses: 2+ in upper extremities, 2+ femoral pulses bilaterally, 2+lower extremity  Musculoskeletal;   Normal muscle tone without kyphosis Neurological:   Oriented and Alert, Cranial nerves intact  Assessment   80 y.o. female with  Encounter Diagnoses  Name Primary?  . Abnormal ECG Yes  . Primary hypertension   . Hypercholesterolemia   . CREST variant of scleroderma (CMS-HCC)         Plan  -Proceed to surgery and/or invasive procedure without restriction to pre or post operative and/or procedural care.  The patient is at lowest risk possible for cardiovascular complications with surgical intervention and/or invasive procedure.  Currently has no evidence active and/or significant angina and/or congestive heart failure.  The patient may discontinue plavix  4 days prior to procedure and restart at a safe period thereafter -No further intervention of LBBB unchanged and stable at this time.  -Continue current medical  regimen for hypertension control which is stable at this time and without apparent significant side effects or symptoms of medications.  Further treatment goals of low sodium diet for additive effects of these medications have been discussed today as well. -We have discussed risk reduction in the cardiovascular disease process by a continuation of lipid management with the current medication management for lipid reduction.  The goals continue to be 30-50% lowering of LDL cholesterol in addition to lifestyle measures.  This will include diet and improved activity level on a regular basis.The patient has an understanding of this discussion at this time and we will continue the appropriate strategy. -Patient was counseled today about the significance of diet and exercise for further risk reduction of cardiovascular events, risk factor reduction, maintaining an appropriate body mass index and weight, and possible improvements in quality of life measures.  These lifestyle modifications were discussed including a walking regimen and/or exercise prescription, a diet rich in fruits and  vegetables, the DASH diet, and appropriate use of medication management. -We have had a long discussion about the benefits of physical and occupational rehabilitation. The patient is advised and encouraged to enroll for improvements in quality of life and reduced hospitalization. -Furosemide for lower extremity edema with continued discussion of other home treatment options including the DASH diet and or compression hose     No orders of the defined types were placed in this encounter.   Return in about 6 months (around 05/29/2021).     WOLM GORDY RHYME, MD

## 2020-12-04 ENCOUNTER — Other Ambulatory Visit: Payer: Self-pay

## 2020-12-04 ENCOUNTER — Ambulatory Visit
Admission: RE | Admit: 2020-12-04 | Discharge: 2020-12-04 | Disposition: A | Discharge status: Home / Self Care | Payer: Medicare Other | Source: Ambulatory Visit | Attending: Urology | Admitting: Urology

## 2020-12-04 DIAGNOSIS — N2889 Other specified disorders of kidney and ureter: Secondary | ICD-10-CM | POA: Diagnosis not present

## 2020-12-04 MED ORDER — GADOBUTROL 1 MMOL/ML IV SOLN
10.0000 mL | Freq: Once | INTRAVENOUS | Status: AC | PRN
  Administered 2020-12-04: 10 mL via INTRAVENOUS

## 2020-12-13 ENCOUNTER — Ambulatory Visit: Payer: Self-pay | Admitting: Urology

## 2020-12-13 ENCOUNTER — Other Ambulatory Visit: Payer: Self-pay | Admitting: Family Medicine

## 2020-12-17 ENCOUNTER — Other Ambulatory Visit: Payer: Self-pay

## 2020-12-17 ENCOUNTER — Encounter: Payer: Self-pay | Admitting: Urology

## 2020-12-17 ENCOUNTER — Ambulatory Visit (INDEPENDENT_AMBULATORY_CARE_PROVIDER_SITE_OTHER): Payer: Medicare Other | Admitting: Urology

## 2020-12-17 VITALS — BP 134/78 | HR 60 | Ht 66.0 in | Wt 218.0 lb

## 2020-12-17 DIAGNOSIS — D1771 Benign lipomatous neoplasm of kidney: Secondary | ICD-10-CM

## 2020-12-17 DIAGNOSIS — R351 Nocturia: Secondary | ICD-10-CM | POA: Diagnosis not present

## 2020-12-17 DIAGNOSIS — N2889 Other specified disorders of kidney and ureter: Secondary | ICD-10-CM

## 2020-12-17 MED ORDER — TROSPIUM CHLORIDE 20 MG PO TABS: 20.0000 mg | ORAL_TABLET | Freq: Every day | ORAL | 0 refills | Status: DC

## 2020-12-17 NOTE — Progress Notes (Signed)
12/17/2020 3:18 PM   Nina Perez 26-Oct-1940 [id number]  Referring provider: Einar Pheasant, [place] [id number] [place],  [place] [address]  Chief Complaint  Patient presents with   Follow-up    HPI: 80 y.o. female presents for 40-monthfollow-up.  Initially seen 11/21/2019 for renal ultrasound showing a 1.3 cm subtle area of increased echogenicity in the left lower pole 661-monthollow-up ultrasound showed stable hypoechoic mass January 2022 and a 6-73-monthllow-up renal mass protocol MRI was recommended which was performed 12/04/2020 Renal mass protocol MRI showed a 1.2 cm macroscopic fat signal lesion inferior pole consistent with a small benign renal angiomyolipoma She also complains of bothersome nocturia 3 times per night no bothersome daytime symptoms She is scheduled for a sleep study  PMH: Past Medical History:  Diagnosis Date   Barrett's esophagus    Cancer ([facility]  skin ca   Diabetes mellitus ([facility]  Gout    History of colon cancer    adenomatous polyps   Hypercholesterolemia    Hypertension    Inflammatory arthritis    Osteoarthritis    knees, spine   Phlebitis    x2 (with pregnancy)   Pulmonary fibrosis (HCC)    mild   Pulmonary hypertension (HCC)    Reflux esophagitis    Rheumatoid arthritis(714.0)    positive RF, FANA, RNP, negative CCP ab and anti DNA,.  neg anti-SCL 70   Scleroderma ([facility]  raynaud's, sclerodactyly, telangiectasias    Surgical History: Past Surgical History:  Procedure Laterality Date   ABDOMINAL AORTOGRAM W/LOWER EXTREMITY Left 05/16/2019   Procedure: ABDOMINAL AORTOGRAM W/LOWER EXTREMITY;  Surgeon: BerLorretta HarpD;  Location: [facility];  Service: Cardiovascular;  Laterality: Left;   AMPUTATION TOE Left 06/02/2019   Procedure: AMPUTATION TOE;  Surgeon: TroAlbertine PatriciaPM;  Location: [facility];  Service: Podiatry;  Laterality: Left;   BREAST CYST ASPIRATION Left    neg   INCISION AND DRAINAGE  OF WOUND Left 06/07/2019   Procedure: IRRIGATION AND DEBRIDEMENT WOUND;  Surgeon: TroAlbertine PatriciaPM;  Location: [facility];  Service: Podiatry;  Laterality: Left;   LOWER EXTREMITY ANGIOGRAPHY Left 06/03/2019   Procedure: Lower Extremity Angiography;  Surgeon: SchKatha CabalD;  [facility];  Service: Cardiovascular;  Laterality: Left;   PERIPHERAL VASCULAR BALLOON ANGIOPLASTY Left 05/16/2019   Procedure: PERIPHERAL VASCULAR BALLOON ANGIOPLASTY;  Surgeon: BerLorretta HarpD;  Location: [facility];  Service: Cardiovascular;  Laterality: Left;  SFA   VEIN LIGATION AND STRIPPING      Home Medications:  Allergies as of 12/17/2020       Reactions   Maxzide [triamterene-hctz] Other (See Comments)   Weakness/fatigue   Hydrochlorothiazide W-triamterene    Other reaction(s): Other (See Comments) Extreme fatigue/weakness        Medication List        Accurate as of December 17, 2020  3:18 PM. If you have any questions, ask your nurse or doctor.          STOP taking these medications    benzonatate 200 MG capsule Commonly known as: TESSALON Stopped by: ScoAbbie SonsD       TAKE these medications    acetaminophen 650 MG CR tablet Commonly known as: TYLENOL Take 650 mg by mouth every 4 (four) hours as needed for pain.   allopurinol 300 MG tablet Commonly known as: ZYLOPRIM Take 1 tablet by mouth once  daily   amLODipine 10 MG tablet Commonly known as: NORVASC TAKE 1 TABLET BY MOUTH DAILY   aspirin 81 MG tablet Take 81 mg by mouth daily. Reported on 07/24/2015   atorvastatin 40 MG tablet Commonly known as: LIPITOR Take 1 tablet by mouth once daily   clopidogrel 75 MG tablet Commonly known as: PLAVIX Take 1 tablet by mouth once daily with breakfast   fexofenadine 180 MG tablet Commonly known as: ALLEGRA Take 1 tablet (180 mg total) by mouth daily.   folic acid 1 MG tablet Commonly known as: FOLVITE Take 1 mg by mouth every  evening.   glucose blood test strip Commonly known as: Estate manager/land agent USE  STRIP TO CHECK GLUCOSE TWICE DAILY. Dx: E11.9   ipratropium 0.06 % nasal spray Commonly known as: ATROVENT Place 2 sprays into both nostrils 4 (four) times daily.   losartan 100 MG tablet Commonly known as: COZAAR Take 1 tablet by mouth once daily   magnesium oxide 400 (241.3 Mg) MG tablet Commonly known as: MAG-OX Take 1 tablet (400 mg total) by mouth 2 (two) times daily.   metFORMIN 500 MG tablet Commonly known as: GLUCOPHAGE Take 1 tablet by mouth once daily   methotrexate 2.5 MG tablet Commonly known as: RHEUMATREX TAKE 4 TABLETS BY MOUTH EVERY 7 DAYS.   metoprolol succinate 50 MG 24 hr tablet Commonly known as: TOPROL-XL TAKE 1 TABLET BY MOUTH ONCE DAILY IMMEDIATLY  FOLLOWING  A  MEAL   Microlet Lancets Misc Check sugar once daily, [employer] Microlet Lancets. Dx E11.9   pantoprazole 40 MG tablet Commonly known as: PROTONIX Take 1 tablet by mouth once daily   spironolactone 25 MG tablet Commonly known as: ALDACTONE Take 1 tablet by mouth once daily        Allergies:  Allergies  Allergen Reactions   Maxzide [Triamterene-Hctz] Other (See Comments)    Weakness/fatigue    Hydrochlorothiazide W-Triamterene     Other reaction(s): Other (See Comments) Extreme fatigue/weakness    Family History: Family History  Problem Relation Age of Onset   Arthritis Mother    Heart attack Father    Colon cancer Neg Hx    Breast cancer Neg Hx     Social History:  reports that she quit smoking about 32 years ago. Her smoking use included cigarettes. She has a 7.50 pack-year smoking history. She has never used smokeless tobacco. She reports that she does not drink alcohol and does not use drugs.   Physical Exam: BP 134/78   Pulse 60   Ht '5\' 6"'$  (1.676 m)   Wt 218 lb (98.9 kg)   LMP 05/25/1988   BMI 35.19 kg/m   Constitutional:  Alert and oriented, No acute distress. HEENT: [place] AT, moist  mucus membranes.  Trachea midline, no masses. Cardiovascular: No clubbing, cyanosis, or edema. Respiratory: Normal respiratory effort, no increased work of breathing.   Assessment & Plan:    1.  Left renal angiomyolipoma MRI findings were discussed in detail and the benign nature of this mass We discussed the need for periodic monitoring due to the increased risk of bleeding if size were to reach >4 cm Follow-up 1 year with renal ultrasound  2.  Nocturia We did discuss that sleep apnea is a common cause of nocturia and she is scheduled for a sleep study In the meantime trial immediate release trospium 20 mg at bedtime   Abbie Sons, MD  [facility] [address] [place],  [place] [address] [phone]

## 2020-12-18 ENCOUNTER — Encounter: Payer: Self-pay | Admitting: Internal Medicine

## 2020-12-18 DIAGNOSIS — D1771 Benign lipomatous neoplasm of kidney: Secondary | ICD-10-CM | POA: Insufficient documentation

## 2020-12-18 DIAGNOSIS — R351 Nocturia: Secondary | ICD-10-CM | POA: Insufficient documentation

## 2020-12-19 ENCOUNTER — Other Ambulatory Visit: Payer: Self-pay | Admitting: Internal Medicine

## 2020-12-19 ENCOUNTER — Other Ambulatory Visit: Payer: Self-pay | Admitting: Cardiovascular Disease

## 2021-01-02 ENCOUNTER — Other Ambulatory Visit: Payer: Self-pay | Admitting: Internal Medicine

## 2021-01-10 ENCOUNTER — Telehealth: Payer: Self-pay

## 2021-01-10 DIAGNOSIS — E1159 Type 2 diabetes mellitus with other circulatory complications: Secondary | ICD-10-CM

## 2021-01-10 DIAGNOSIS — I10 Essential (primary) hypertension: Secondary | ICD-10-CM

## 2021-01-10 DIAGNOSIS — E7800 Pure hypercholesterolemia, unspecified: Secondary | ICD-10-CM

## 2021-01-10 DIAGNOSIS — R7989 Other specified abnormal findings of blood chemistry: Secondary | ICD-10-CM

## 2021-01-10 NOTE — Telephone Encounter (Signed)
Lab orders have been placed

## 2021-01-11 ENCOUNTER — Other Ambulatory Visit (INDEPENDENT_AMBULATORY_CARE_PROVIDER_SITE_OTHER): Payer: Medicare Other

## 2021-01-11 ENCOUNTER — Other Ambulatory Visit: Payer: Self-pay

## 2021-01-11 DIAGNOSIS — E7800 Pure hypercholesterolemia, unspecified: Secondary | ICD-10-CM | POA: Diagnosis not present

## 2021-01-11 DIAGNOSIS — E1159 Type 2 diabetes mellitus with other circulatory complications: Secondary | ICD-10-CM | POA: Diagnosis not present

## 2021-01-11 DIAGNOSIS — I10 Essential (primary) hypertension: Secondary | ICD-10-CM | POA: Diagnosis not present

## 2021-01-11 LAB — LIPID PANEL
Cholesterol: 88 mg/dL (ref 0–200)
HDL: 35.7 mg/dL — ABNORMAL LOW (ref >39.00)
LDL Cholesterol: 33 mg/dL (ref 0–99)
NonHDL: 52.48
Total CHOL/HDL Ratio: 2
Triglycerides: 99 mg/dL (ref 0.0–149.0)
VLDL: 19.8 mg/dL (ref 0.0–40.0)

## 2021-01-11 LAB — HEPATIC FUNCTION PANEL
ALT: 14 U/L (ref 0–35)
AST: 22 U/L (ref 0–37)
Albumin: 3.6 g/dL (ref 3.5–5.2)
Alkaline Phosphatase: 76 U/L (ref 39–117)
Bilirubin, Direct: 0.2 mg/dL (ref 0.0–0.3)
Total Bilirubin: 0.7 mg/dL (ref 0.2–1.2)
Total Protein: 6.7 g/dL (ref 6.0–8.3)

## 2021-01-11 LAB — BASIC METABOLIC PANEL
BUN: 24 mg/dL — ABNORMAL HIGH (ref 6–23)
CO2: 29 mEq/L (ref 19–32)
Calcium: 10.1 mg/dL (ref 8.4–10.5)
Chloride: 106 mEq/L (ref 96–112)
Creatinine, Ser: 1.11 mg/dL (ref 0.40–1.20)
GFR: 47.23 mL/min — ABNORMAL LOW (ref >60.00)
Glucose, Bld: 91 mg/dL (ref 70–99)
Potassium: 4.2 mEq/L (ref 3.5–5.1)
Sodium: 140 mEq/L (ref 135–145)

## 2021-01-11 LAB — HEMOGLOBIN A1C: Hgb A1c MFr Bld: 6 % (ref 4.6–6.5)

## 2021-01-15 ENCOUNTER — Ambulatory Visit (INDEPENDENT_AMBULATORY_CARE_PROVIDER_SITE_OTHER): Payer: Medicare Other | Admitting: Internal Medicine

## 2021-01-15 ENCOUNTER — Other Ambulatory Visit: Payer: Self-pay

## 2021-01-15 VITALS — BP 136/80 | HR 75 | Temp 97.9°F | Resp 16 | Ht 66.0 in | Wt 226.4 lb

## 2021-01-15 DIAGNOSIS — Z23 Encounter for immunization: Secondary | ICD-10-CM

## 2021-01-15 DIAGNOSIS — I70249 Atherosclerosis of native arteries of left leg with ulceration of unspecified site: Secondary | ICD-10-CM

## 2021-01-15 DIAGNOSIS — D696 Thrombocytopenia, unspecified: Secondary | ICD-10-CM

## 2021-01-15 DIAGNOSIS — N1831 Chronic kidney disease, stage 3a: Secondary | ICD-10-CM

## 2021-01-15 DIAGNOSIS — M069 Rheumatoid arthritis, unspecified: Secondary | ICD-10-CM

## 2021-01-15 DIAGNOSIS — E7800 Pure hypercholesterolemia, unspecified: Secondary | ICD-10-CM

## 2021-01-15 DIAGNOSIS — E1159 Type 2 diabetes mellitus with other circulatory complications: Secondary | ICD-10-CM

## 2021-01-15 DIAGNOSIS — D1771 Benign lipomatous neoplasm of kidney: Secondary | ICD-10-CM

## 2021-01-15 DIAGNOSIS — Z89422 Acquired absence of other left toe(s): Secondary | ICD-10-CM

## 2021-01-15 DIAGNOSIS — R351 Nocturia: Secondary | ICD-10-CM

## 2021-01-15 DIAGNOSIS — I10 Essential (primary) hypertension: Secondary | ICD-10-CM

## 2021-01-15 DIAGNOSIS — I272 Pulmonary hypertension, unspecified: Secondary | ICD-10-CM

## 2021-01-15 DIAGNOSIS — I700 Atherosclerosis of aorta: Secondary | ICD-10-CM

## 2021-01-15 DIAGNOSIS — K2100 Gastro-esophageal reflux disease with esophagitis, without bleeding: Secondary | ICD-10-CM

## 2021-01-15 DIAGNOSIS — I4949 Other premature depolarization: Secondary | ICD-10-CM | POA: Diagnosis not present

## 2021-01-15 DIAGNOSIS — I739 Peripheral vascular disease, unspecified: Secondary | ICD-10-CM

## 2021-01-15 DIAGNOSIS — M341 CR(E)ST syndrome: Secondary | ICD-10-CM

## 2021-01-15 NOTE — Progress Notes (Signed)
Patient ID: Nina Perez, female   DOB: [birth year], 80 y.o.   MRN: [id number]   Subjective:    Patient ID: Nina Perez, female    DOB: 09-Feb-1941, 80 y.o.   MRN: [id number]  This visit occurred during the SARS-CoV-2 public health emergency.  Safety protocols were in place, including screening questions prior to the visit, additional usage of staff PPE, and extensive cleaning of exam room while observing appropriate contact time as indicated for disinfecting solutions.   Patient here for scheduled follow up.   Chief Complaint  Patient presents with   Diabetes   Hypertension   .   HPI Accompanied by her husband.  History obtained from both of them.  She has been doing relatively well.  Not checking pressures often.  States Dr Nehemiah Massed stopped her spironolactone.  No chest pain.  Breathing overall stable.  Seeing Dr Laural Roes.  Diagnosed with sleep apnea.  Waiting for supplies for cpap. Oxygen.  Recommended pulmonary rehab.  Also saw Dr Bernardo Heater 12/17/20.  MRI - left renal angiomyolipoma.  Recommended f/u renal ultrasound in one year.  Trial of trospium to help with nocturia.  Also discussed, probable improvement with treatment of sleep apnea.  Using a ane to get around.  Eating.  No nausea or vomiting. No abdominal pain.     Past Medical History:  Diagnosis Date   Barrett's esophagus    Cancer ([place])    skin ca   Diabetes mellitus ([place])    Gout    History of colon cancer    adenomatous polyps   Hypercholesterolemia    Hypertension    Inflammatory arthritis    Osteoarthritis    knees, spine   Phlebitis    x2 (with pregnancy)   Pulmonary fibrosis (HCC)    mild   Pulmonary hypertension (HCC)    Reflux esophagitis    Rheumatoid arthritis(714.0)    positive RF, FANA, RNP, negative CCP ab and anti DNA,.  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, sclerodactyly, telangiectasias   Past Surgical History:  Procedure Laterality Date   ABDOMINAL AORTOGRAM W/LOWER EXTREMITY Left  05/16/2019   Procedure: ABDOMINAL AORTOGRAM W/LOWER EXTREMITY;  Surgeon: Lorretta Harp, MD;  Location: [facility];  Service: Cardiovascular;  Laterality: Left;   AMPUTATION TOE Left 06/02/2019   Procedure: AMPUTATION TOE;  Surgeon: Albertine Patricia, DPM;  Location: [facility];  Service: Podiatry;  Laterality: Left;   BREAST CYST ASPIRATION Left    neg   INCISION AND DRAINAGE OF WOUND Left 06/07/2019   Procedure: IRRIGATION AND DEBRIDEMENT WOUND;  Surgeon: Albertine Patricia, DPM;  Location: [facility];  Service: Podiatry;  Laterality: Left;   LOWER EXTREMITY ANGIOGRAPHY Left 06/03/2019   Procedure: Lower Extremity Angiography;  Surgeon: Katha Cabal, MD;  Location: [facility];  Service: Cardiovascular;  Laterality: Left;   PERIPHERAL VASCULAR BALLOON ANGIOPLASTY Left 05/16/2019   Procedure: PERIPHERAL VASCULAR BALLOON ANGIOPLASTY;  Surgeon: Lorretta Harp, MD;  Location: [facility];  Service: Cardiovascular;  Laterality: Left;  SFA   VEIN LIGATION AND STRIPPING     Family History  Problem Relation Age of Onset   Arthritis Mother    Heart attack Father    Colon cancer Neg Hx    Breast cancer Neg Hx    Social History   Socioeconomic History   Marital status: Married    Spouse name: Not on file   Number of children: Not on file  Years of education: Not on file   Highest education level: Not on file  Occupational History   Not on file  Tobacco Use   Smoking status: Former    Packs/day: 0.50    Years: 15.00    Pack years: 7.50    Types: Cigarettes    Quit date: 05/12/1988    Years since quitting: 32.7   Smokeless tobacco: Never  Vaping Use   Vaping Use: Never used  Substance and Sexual Activity   Alcohol use: No    Alcohol/week: 0.0 standard drinks   Drug use: No   Sexual activity: Yes  Other Topics Concern   Not on file  Social History Narrative   Not on file   Social Determinants of Health   Financial Resource Strain: Low Risk    Difficulty  of Paying Living Expenses: Not hard at all  Food Insecurity: No Food Insecurity   Worried About Charity fundraiser in the Last Year: Never true   [place] in the Last Year: Never true  Transportation Needs: No Transportation Needs   Lack of Transportation (Medical): No   Lack of Transportation (Non-Medical): No  Physical Activity: Not on file  Stress: No Stress Concern Present   Feeling of Stress : Not at all  Social Connections: Socially Integrated   Frequency of Communication with Friends and Family: More than three times a week   Frequency of Social Gatherings with Friends and Family: Not on file   Attends Religious Services: More than 4 times per year   Active Member of [employer] or Organizations: Yes   Attends Music therapist: More than 4 times per year   Marital Status: Married     Review of Systems  Constitutional:  Negative for appetite change and unexpected weight change.  HENT:  Negative for congestion and sinus pressure.   Respiratory:  Negative for cough and chest tightness.        Breathing stable.   Cardiovascular:  Negative for chest pain and palpitations.       No increased leg swelling.    Gastrointestinal:  Negative for abdominal pain, diarrhea, nausea and vomiting.  Genitourinary:  Negative for difficulty urinating and dysuria.  Musculoskeletal:  Negative for joint swelling and myalgias.  Skin:  Negative for color change and rash.  Neurological:  Negative for dizziness, light-headedness and headaches.  Psychiatric/Behavioral:  Negative for agitation and dysphoric mood.       Objective:     BP 136/80   Pulse 75   Temp 97.9 F (36.6 C)   Resp 16   Ht '5\' 6"'  (1.676 m)   Wt 226 lb 6.4 oz (102.7 kg)   LMP 05/25/1988   SpO2 98%   BMI 36.54 kg/m  Wt Readings from Last 3 Encounters:  01/15/21 226 lb 6.4 oz (102.7 kg)  12/17/20 218 lb (98.9 kg)  11/19/20 218 lb (98.9 kg)    Physical Exam Vitals reviewed.  Constitutional:       General: She is not in acute distress.    Appearance: Normal appearance.  HENT:     Head: Normocephalic and atraumatic.     Right Ear: External ear normal.     Left Ear: External ear normal.  Eyes:     General: No scleral icterus.       Right eye: No discharge.        Left eye: No discharge.     Conjunctiva/sclera: Conjunctivae normal.  Neck:  Thyroid: No thyromegaly.  Cardiovascular:     Rate and Rhythm: Normal rate and regular rhythm.  Pulmonary:     Effort: No respiratory distress.     Breath sounds: Normal breath sounds. No wheezing.  Abdominal:     General: Bowel sounds are normal.     Palpations: Abdomen is soft.     Tenderness: There is no abdominal tenderness.  Musculoskeletal:        General: No swelling or tenderness.     Cervical back: Neck supple. No tenderness.  Lymphadenopathy:     Cervical: No cervical adenopathy.  Skin:    Findings: No erythema or rash.  Neurological:     Mental Status: She is alert.  Psychiatric:        Mood and Affect: Mood normal.        Behavior: Behavior normal.     Outpatient Encounter Medications as of 01/15/2021  Medication Sig   acetaminophen (TYLENOL) 650 MG CR tablet Take 650 mg by mouth every 4 (four) hours as needed for pain.    allopurinol (ZYLOPRIM) 300 MG tablet Take 1 tablet by mouth once daily   amLODipine (NORVASC) 10 MG tablet TAKE 1 TABLET BY MOUTH DAILY   aspirin 81 MG tablet Take 81 mg by mouth daily. Reported on 07/24/2015   clopidogrel (PLAVIX) 75 MG tablet Take 1 tablet by mouth once daily with breakfast   fexofenadine (ALLEGRA) 180 MG tablet Take 1 tablet (180 mg total) by mouth daily.   folic acid (FOLVITE) 1 MG tablet Take 1 mg by mouth every evening.    glucose blood (BAYER CONTOUR TEST) test strip USE  STRIP TO CHECK GLUCOSE TWICE DAILY. Dx: E11.9   ipratropium (ATROVENT) 0.06 % nasal spray Place 2 sprays into both nostrils 4 (four) times daily.   magnesium oxide (MAG-OX) 400 (241.3 Mg) MG tablet Take 1  tablet (400 mg total) by mouth 2 (two) times daily.   metFORMIN (GLUCOPHAGE) 500 MG tablet Take 1 tablet by mouth once daily   methotrexate (RHEUMATREX) 2.5 MG tablet TAKE 4 TABLETS BY MOUTH EVERY 7 DAYS.   metoprolol succinate (TOPROL-XL) 50 MG 24 hr tablet TAKE 1 TABLET BY MOUTH ONCE DAILY IMMEDIATLY  FOLLOWING  A  MEAL   MICROLET LANCETS MISC Check sugar once daily, [employer] Microlet Lancets. Dx E11.9   pantoprazole (PROTONIX) 40 MG tablet Take 1 tablet by mouth once daily   trospium (SANCTURA) 20 MG tablet Take 1 tablet (20 mg total) by mouth at bedtime.   [DISCONTINUED] atorvastatin (LIPITOR) 40 MG tablet Take 1 tablet by mouth once daily   [DISCONTINUED] losartan (COZAAR) 100 MG tablet Take 1 tablet by mouth once daily   [DISCONTINUED] spironolactone (ALDACTONE) 25 MG tablet Take 1 tablet by mouth once daily (Patient not taking: Reported on 01/15/2021)   No facility-administered encounter medications on file as of 01/15/2021.     Lab Results  Component Value Date   WBC 5.3 10/02/2020   HGB 12.8 10/02/2020   HCT 40.2 10/02/2020   PLT 152.0 10/02/2020   GLUCOSE 91 01/11/2021   CHOL 88 01/11/2021   TRIG 99.0 01/11/2021   HDL 35.70 (L) 01/11/2021   LDLCALC 33 01/11/2021   ALT 14 01/11/2021   AST 22 01/11/2021   NA 140 01/11/2021   K 4.2 01/11/2021   CL 106 01/11/2021   CREATININE 1.11 01/11/2021   BUN 24 (H) 01/11/2021   CO2 29 01/11/2021   TSH 4.33 07/03/2020   INR 1.1 06/02/2019  HGBA1C 6.0 01/11/2021   MICROALBUR 2.7 (H) 09/28/2020    MR Abdomen W Wo Contrast  Result Date: 12/05/2020 CLINICAL DATA:  Left lower pole renal mass identified by prior ultrasound EXAM: MRI ABDOMEN WITHOUT AND WITH CONTRAST TECHNIQUE: Multiplanar multisequence MR imaging of the abdomen was performed both before and after the administration of intravenous contrast. CONTRAST:  58m GADAVIST GADOBUTROL 1 MMOL/ML IV SOLN COMPARISON:  Renal ultrasound, 05/23/2020 FINDINGS: Lower chest: No acute  findings.  Moderate hiatal hernia. Hepatobiliary: No mass or other parenchymal abnormality identified. Pancreas: No mass, inflammatory changes, or other parenchymal abnormality identified. Spleen:  Within normal limits in size and appearance. Adrenals/Urinary Tract: Incidental small, definitively benign macroscopic fat containing left adrenal adenoma (series 4, image 18). There is a macroscopic fat signal lesion of the inferior pole of the left kidney (series 2, image 27, series 3, image 26) without associated contrast enhancement, measuring approximately 1.2 cm. Mild left renal cortical scarring and atrophy. No evidence of hydronephrosis. Stomach/Bowel: Visualized portions within the abdomen are unremarkable. Vascular/Lymphatic: No pathologically enlarged lymph nodes identified. No abdominal aortic aneurysm demonstrated. Other:  None. Musculoskeletal: No suspicious bone lesions identified. IMPRESSION: 1. There is a macroscopic fat signal lesion of the inferior pole of the left kidney without associated contrast enhancement, measuring approximately 1.2 cm and corresponding to hyperechoic mass identified by prior ultrasound. This is consistent with a small, benign renal angiomyolipoma. No suspicious renal lesions. 2. Mild left renal cortical scarring and atrophy, suggesting prior infectious or obstructive insult. 3. Moderate hiatal hernia. Electronically Signed   By: AEddie CandleM.D.   On: 12/05/2020 11:14       Assessment & Plan:   Problem List Items Addressed This Visit     Acquired absence of other left toe(s) ([place]    S/p amputation.  Followed by [facility].       Aortic atherosclerosis ([place]    Continue lipitor.       CKD (chronic kidney disease) stage 3, GFR 30-59 ml/min (HCC)    GFR just checked 47.  Stable. Avoid antiinflammatories.  Stay hydrated. Follow metabolic panel.       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   Low carb diet and exercise.  Follow met b and a1c.       Relevant Orders   Hemoglobin A1c   GERD (gastroesophageal reflux disease)    No acid reflux reported.  Protonix.       Hypercholesterolemia    Continue lipitor.  Low cholesterol diet and exercise.  Follow lipid panel and liver function tests.        Relevant Orders   Basic metabolic panel   Lipid panel   Hepatic function panel   Hypertension    Blood pressure doing well.  Continue metoprolol, losartan and amlodipine.  Follow pressures.  Follow metabolic panel.       Nocturia    Trial of trospium.  Treat sleep apnea.       PAD (peripheral artery disease) (HCC)    S/p angiogram, angioplasty and toe amputation.  Continue lipitor and plavix.  Follow.       Premature beats - Primary    Noted on exam.  EKG - SR with PVCs present.  No acute ischemic changes.  Treat sleep apnea.  No chest pain.  No increased sob.  No dizziness or light headedness.  Follow.  Relevant Orders   EKG 12-Lead (Completed)   Pulmonary hypertension, mild ([place])    Continue f/u with pulmonary.  Treat sleep apnea.        Renal angiolipoma    saw Dr Bernardo Heater 12/17/20.  MRI - left renal angiomyolipoma.  Recommended f/u renal ultrasound in one year.       Rheumatoid arthritis (HCC)    On MTX.  Followed by Dr Jefm Bryant.       Thrombocytopenia ([place])    12/12/20 - platelet count wnl.  Follow.       Other Visit Diagnoses     Need for immunization against influenza       Relevant Orders   Flu Vaccine QUAD High Dose(Fluad) (Completed)        Einar Pheasant, MD

## 2021-01-16 ENCOUNTER — Other Ambulatory Visit: Payer: Self-pay | Admitting: Internal Medicine

## 2021-01-20 ENCOUNTER — Encounter: Payer: Self-pay | Admitting: Internal Medicine

## 2021-01-20 DIAGNOSIS — I700 Atherosclerosis of aorta: Secondary | ICD-10-CM | POA: Insufficient documentation

## 2021-01-20 NOTE — Assessment & Plan Note (Signed)
Followed by Dr Kernodle.   

## 2021-01-20 NOTE — Assessment & Plan Note (Signed)
GFR just checked 47.  Stable. Avoid antiinflammatories.  Stay hydrated. Follow metabolic panel.

## 2021-01-20 NOTE — Assessment & Plan Note (Signed)
S/p amputation.  Followed by [facility].  

## 2021-01-20 NOTE — Assessment & Plan Note (Signed)
No acid reflux reported.  Protonix.

## 2021-01-20 NOTE — Assessment & Plan Note (Signed)
On metformin.  Low carb diet and exercise.  Follow met b and a1c.   

## 2021-01-20 NOTE — Assessment & Plan Note (Signed)
On MTX.  Followed by Dr Jefm Bryant.

## 2021-01-20 NOTE — Assessment & Plan Note (Signed)
S/p angiogram, angioplasty and toe amputation.  Continue lipitor and plavix.  Follow.

## 2021-01-20 NOTE — Assessment & Plan Note (Signed)
saw Dr Stoioff 12/17/20.  MRI - left renal angiomyolipoma.  Recommended f/u renal ultrasound in one year.  

## 2021-01-20 NOTE — Assessment & Plan Note (Signed)
Continue f/u with pulmonary.  Treat sleep apnea.

## 2021-01-20 NOTE — Assessment & Plan Note (Signed)
12/12/20 - platelet count wnl.  Follow.

## 2021-01-20 NOTE — Assessment & Plan Note (Signed)
Trial of trospium.  Treat sleep apnea.

## 2021-01-20 NOTE — Assessment & Plan Note (Signed)
Noted on exam.  EKG - SR with PVCs present.  No acute ischemic changes.  Treat sleep apnea.  No chest pain.  No increased sob.  No dizziness or light headedness.  Follow.

## 2021-01-20 NOTE — Assessment & Plan Note (Signed)
Continue lipitor  ?

## 2021-01-20 NOTE — Assessment & Plan Note (Addendum)
Blood pressure doing well.  Continue metoprolol, losartan and amlodipine.  Follow pressures.  Follow metabolic panel.

## 2021-01-20 NOTE — Assessment & Plan Note (Signed)
Continue lipitor.  Low cholesterol diet and exercise.  Follow lipid panel and liver function tests.   

## 2021-02-06 ENCOUNTER — Other Ambulatory Visit: Payer: Self-pay | Admitting: Internal Medicine

## 2021-03-22 NOTE — Discharge Instructions (Signed)
Instructions after Total Knee Replacement   Nina Perez Diana, Jr., M.D.     Dept. of Orthopaedics & Sports Medicine  [facility]  [address]  [place], [place]  [address]  Phone: [phone]   Fax: [phone]    DIET: Drink plenty of non-alcoholic fluids. Resume your normal diet. Include foods high in fiber.  ACTIVITY:  You may use crutches or a walker with weight-bearing as tolerated, unless instructed otherwise. You may be weaned off of the walker or crutches by your Physical Therapist.  Do NOT place pillows under the knee. Anything placed under the knee could limit your ability to straighten the knee.   Continue doing gentle exercises. Exercising will reduce the pain and swelling, increase motion, and prevent muscle weakness.   Please continue to use the TED compression stockings for 6 weeks. You may remove the stockings at night, but should reapply them in the morning. Do not drive or operate any equipment until instructed.  WOUND CARE:  Continue to use the PolarCare or ice packs periodically to reduce pain and swelling. You may bathe or shower after the staples are removed at the first office visit following surgery.  MEDICATIONS: You may resume your regular medications. Please take the pain medication as prescribed on the medication. Do not take pain medication on an empty stomach. You have been given a prescription for a blood thinner (Lovenox or Coumadin). Please take the medication as instructed. (NOTE: After completing a 2 week course of Lovenox, take one Enteric-coated aspirin once a day. This along with elevation will help reduce the possibility of phlebitis in your operated leg.) Do not drive or drink alcoholic beverages when taking pain medications.  CALL THE OFFICE FOR: Temperature above 101 degrees Excessive bleeding or drainage on the dressing. Excessive swelling, coldness, or paleness of the toes. Persistent nausea and vomiting.  FOLLOW-UP:  You  should have an appointment to return to the office in 10-14 days after surgery. Arrangements have been made for continuation of Physical Therapy (either home therapy or outpatient therapy).   [facility] Department Directory         [email or web address]       [email or web address]          Cardiology  Appointments: [place] - [phone] [place] - [phone]  Endocrinology  Appointments: [place] - [phone] [place] - [phone]  Gastroenterology  Appointments: [place] - [phone] [place] - [phone]        General Surgery   Appointments: [place] - [phone]  Internal Medicine/Family Medicine  Appointments: [place] - [phone] [place] - [phone] [place] - [phone]  Metabolic and Weigh Loss Surgery  Appointments: [place] - [phone]        Neurology  Appointments: [place] - [phone] [place] - [phone]  Neurosurgery  Appointments: [place] - [phone]  Obstetrics & Gynecology  Appointments: [place] - [phone] [place] - [phone]        Pediatrics  Appointments: [place] - [phone] [place] - [phone]  Physiatry  Appointments: [place] -[phone]  Physical Therapy  Appointments: [place] - [phone] [place] - [phone]        Podiatry  Appointments: [place] - [phone] [place] - [phone]  Pulmonology  Appointments: [place] - [phone]  Rheumatology  Appointments: [place] - [phone]         Location: [facility]  [address] , [place]  [address]  [place] Location: [facility] [address] [place], [place]  [address]  [place] Location: [facility] [address] [place], [place]  [address]    

## 2021-03-23 ENCOUNTER — Other Ambulatory Visit: Payer: Self-pay | Admitting: Internal Medicine

## 2021-03-25 ENCOUNTER — Encounter
Admission: RE | Admit: 2021-03-25 | Discharge: 2021-03-25 | Disposition: A | Discharge status: Home / Self Care | Payer: Medicare Other | Source: Ambulatory Visit | Attending: Orthopedic Surgery | Admitting: Orthopedic Surgery

## 2021-03-25 ENCOUNTER — Other Ambulatory Visit: Payer: Self-pay

## 2021-03-25 VITALS — BP 143/83 | HR 71 | Temp 98.1°F | Resp 20 | Ht 66.0 in | Wt 223.9 lb

## 2021-03-25 DIAGNOSIS — M175 Other unilateral secondary osteoarthritis of knee: Secondary | ICD-10-CM | POA: Diagnosis not present

## 2021-03-25 DIAGNOSIS — N1831 Chronic kidney disease, stage 3a: Secondary | ICD-10-CM | POA: Insufficient documentation

## 2021-03-25 DIAGNOSIS — E1159 Type 2 diabetes mellitus with other circulatory complications: Secondary | ICD-10-CM | POA: Diagnosis not present

## 2021-03-25 DIAGNOSIS — M069 Rheumatoid arthritis, unspecified: Secondary | ICD-10-CM | POA: Insufficient documentation

## 2021-03-25 DIAGNOSIS — Z01818 Encounter for other preprocedural examination: Secondary | ICD-10-CM | POA: Diagnosis not present

## 2021-03-25 DIAGNOSIS — D696 Thrombocytopenia, unspecified: Secondary | ICD-10-CM | POA: Diagnosis not present

## 2021-03-25 DIAGNOSIS — R300 Dysuria: Secondary | ICD-10-CM | POA: Diagnosis not present

## 2021-03-25 DIAGNOSIS — E1122 Type 2 diabetes mellitus with diabetic chronic kidney disease: Secondary | ICD-10-CM | POA: Insufficient documentation

## 2021-03-25 DIAGNOSIS — Z01812 Encounter for preprocedural laboratory examination: Secondary | ICD-10-CM

## 2021-03-25 HISTORY — DX: Sleep apnea, unspecified: G47.30

## 2021-03-25 HISTORY — DX: Dyspnea, unspecified: R06.00

## 2021-03-25 LAB — URINALYSIS, ROUTINE W REFLEX MICROSCOPIC
Bilirubin Urine: NEGATIVE
Glucose, UA: NEGATIVE mg/dL
Hgb urine dipstick: NEGATIVE
Ketones, ur: NEGATIVE mg/dL
Nitrite: NEGATIVE
Protein, ur: 30 mg/dL — AB
Specific Gravity, Urine: 1.019 (ref 1.005–1.030)
WBC, UA: >50 WBC/hpf — ABNORMAL HIGH (ref 0–5)
pH: 5 (ref 5.0–8.0)

## 2021-03-25 LAB — PROTIME-INR
INR: 1.1 (ref 0.8–1.2)
Prothrombin Time: 13.7 seconds (ref 11.4–15.2)

## 2021-03-25 LAB — COMPREHENSIVE METABOLIC PANEL
ALT: 17 U/L (ref 0–44)
AST: 23 U/L (ref 15–41)
Albumin: 3.8 g/dL (ref 3.5–5.0)
Alkaline Phosphatase: 84 U/L (ref 38–126)
Anion gap: 7 (ref 5–15)
BUN: 29 mg/dL — ABNORMAL HIGH (ref 8–23)
CO2: 26 mmol/L (ref 22–32)
Calcium: 9.4 mg/dL (ref 8.9–10.3)
Chloride: 106 mmol/L (ref 98–111)
Creatinine, Ser: 1.11 mg/dL — ABNORMAL HIGH (ref 0.44–1.00)
GFR, Estimated: 51 mL/min — ABNORMAL LOW (ref >60)
Glucose, Bld: 117 mg/dL — ABNORMAL HIGH (ref 70–99)
Potassium: 3.7 mmol/L (ref 3.5–5.1)
Sodium: 139 mmol/L (ref 135–145)
Total Bilirubin: 1.1 mg/dL (ref 0.3–1.2)
Total Protein: 7.8 g/dL (ref 6.5–8.1)

## 2021-03-25 LAB — APTT: aPTT: 37 seconds — ABNORMAL HIGH (ref 24–36)

## 2021-03-25 LAB — CBC
HCT: 40.7 % (ref 36.0–46.0)
Hemoglobin: 12.9 g/dL (ref 12.0–15.0)
MCH: 33.9 pg (ref 26.0–34.0)
MCHC: 31.7 g/dL (ref 30.0–36.0)
MCV: 107.1 fL — ABNORMAL HIGH (ref 80.0–100.0)
Platelets: 186 10*3/uL (ref 150–400)
RBC: 3.8 MIL/uL — ABNORMAL LOW (ref 3.87–5.11)
RDW: 17.1 % — ABNORMAL HIGH (ref 11.5–15.5)
WBC: 6 10*3/uL (ref 4.0–10.5)
nRBC: 0 % (ref 0.0–0.2)

## 2021-03-25 LAB — TYPE AND SCREEN
ABO/RH(D): B NEG
Antibody Screen: NEGATIVE

## 2021-03-25 LAB — SURGICAL PCR SCREEN
MRSA, PCR: NEGATIVE
Staphylococcus aureus: NEGATIVE

## 2021-03-25 LAB — C-REACTIVE PROTEIN: CRP: 1 mg/dL — ABNORMAL HIGH (ref <1.0)

## 2021-03-25 LAB — SEDIMENTATION RATE: Sed Rate: 14 mm/hr (ref 0–30)

## 2021-03-25 LAB — HEMOGLOBIN A1C
Hgb A1c MFr Bld: 5.9 % — ABNORMAL HIGH (ref 4.8–5.6)
Mean Plasma Glucose: 122.63 mg/dL

## 2021-03-25 NOTE — Patient Instructions (Addendum)
Your procedure is scheduled on: Monday April 08, 2021. Report to Day Surgery inside [facility] 2nd floor. To find out your arrival time please call [phone] between 1PM - 3PM on  Friday April 05, 2021.  Remember: Instructions that are not followed completely may result in serious medical risk,  up to and including death, or upon the discretion of your surgeon and anesthesiologist your  surgery may need to be rescheduled.     _X__ 1. Do not eat food after midnight the night before your procedure.                 No chewing gum or hard candies. You may drink clear liquids up to 2 hours                 before you are scheduled to arrive for your surgery- DO not drink clear                 liquids within 2 hours of the start of your surgery.                 Clear Liquids include:  water, apple juice without pulp, clear Gatorade, G2 or                  Gatorade Zero (avoid Red/Purple/Blue), Black Coffee or Tea (Do not add                 anything to coffee or tea).   __X__2.  On the morning of surgery brush your teeth with toothpaste and water, you                may rinse your mouth with mouthwash if you wish.  Do not swallow any toothpaste of mouthwash.     _X__ 3.  No Alcohol for 24 hours before or after surgery.   _X__ 4.  Do Not Smoke or use e-cigarettes For 24 Hours Prior to Your Surgery.                 Do not use any chewable tobacco products for at least 6 hours prior to                 Surgery.  _X__  5.  Do not use any recreational drugs (marijuana, cocaine, heroin, ecstasy, MDMA or other)                For at least one week prior to your surgery.  Combination of these drugs with anesthesia                May have life threatening results.  __X__6.  Notify your doctor if there is any change in your medical condition      (cold, fever, infections).     Do not wear jewelry, make-up, hairpins, clips or nail polish. Do not wear lotions,  powders, or perfumes. You may wear deodorant. Do not shave 48 hours prior to surgery. Men may shave face and neck. Do not bring valuables to the hospital.    [facility] is not responsible for any belongings or valuables.  Contacts, dentures or bridgework may not be worn into surgery. Leave your suitcase in the car. After surgery it may be brought to your room. For patients admitted to the hospital, discharge time is determined by your treatment team.   Patients discharged the day of surgery will not be allowed to drive home.   Make arrangements for someone to  be with you for the first 24 hours of your Same Day Discharge.    Please read over the following fact sheets that you were given:   Total Joint Packet    __X__ Take these medicines the morning of surgery with A SIP OF WATER:    1. allopurinol (ZYLOPRIM) 300 MG  2. amLODipine (NORVASC) 10 MG  3. metoprolol succinate (TOPROL-XL) 50 MG 24 hr   4. pantoprazole (PROTONIX) 40 MG Take one dose the night before your surgery and the morning of surgey  5.   6.  ____ Fleet Enema (as directed)   __X__ Use CHG Soap (or wipes) as directed  ____ Use Benzoyl Peroxide Gel as instructed  __X__ Use inhalers on the day of surgery  ipratropium (ATROVENT) 0.06 % nasal spray  _X___ Stop metFORMIN (GLUCOPHAGE) 500 MG 2 days prior to surgery (take last dose Friday 04/05/21)    ____ Take 1/2 of usual insulin dose the night before surgery. No insulin the morning          of surgery.   __X__ Stop clopidogrel (PLAVIX) 75 MG 7 days prior to your surgery (take last dose 03/31/21) as instructed by your doctor.   __X__ Ask your doctor when to stop aspirin 81 mg before your surgery.  __X__ One Week prior to surgery- Stop Anti-inflammatories such as Ibuprofen, Aleve, Advil, Motrin, meloxicam (MOBIC), diclofenac, etodolac, ketorolac, Toradol, Daypro, piroxicam, Goody's or BC powders. OK TO USE TYLENOL IF NEEDED   __X__ Stop ALL supplements and  vitamins until after surgery.    ____ Bring C-Pap to the hospital.    If you have any questions regarding your pre-procedure instructions,  Please call Pre-admit Testing at [phone].

## 2021-03-27 ENCOUNTER — Telehealth: Payer: Self-pay | Admitting: Urgent Care

## 2021-03-27 DIAGNOSIS — Z01812 Encounter for preprocedural laboratory examination: Secondary | ICD-10-CM

## 2021-03-27 DIAGNOSIS — B9620 Unspecified Escherichia coli [E. coli] as the cause of diseases classified elsewhere: Secondary | ICD-10-CM

## 2021-03-27 DIAGNOSIS — N390 Urinary tract infection, site not specified: Secondary | ICD-10-CM

## 2021-03-27 LAB — URINE CULTURE: Culture: >=100000 — AB

## 2021-03-27 MED ORDER — CEPHALEXIN 500 MG PO CAPS: 500.0000 mg | ORAL_CAPSULE | Freq: Two times a day (BID) | ORAL | 0 refills | Status: AC

## 2021-03-27 NOTE — Progress Notes (Signed)
  [facility] Perioperative Services: Pre-Admission/Anesthesia Testing  Abnormal Lab Notification and Treatment Plan of Care   Date: 03/27/21  Name: Nina Perez MRN:   [id number]  Re: Abnormal labs noted during PAT appointment   Notified:  Provider Name Provider Role Notification Mode  Skip Estimable, MD Orthopedics Routed and/or faxed via [place] and Notes:  ABNORMAL LAB VALUE(S): Lab Results  Component Value Date   COLORURINE YELLOW (A) 03/25/2021   APPEARANCEUR CLOUDY (A) 03/25/2021   LABSPEC 1.019 03/25/2021   PHURINE 5.0 03/25/2021   GLUCOSEU NEGATIVE 03/25/2021   HGBUR NEGATIVE 03/25/2021   BILIRUBINUR NEGATIVE 03/25/2021   KETONESUR NEGATIVE 03/25/2021   PROTEINUR 30 (A) 03/25/2021   UROBILINOGEN 0.2 10/10/2020   NITRITE NEGATIVE 03/25/2021   LEUKOCYTESUR LARGE (A) 03/25/2021   EPIU 6-10 03/25/2021   WBCU >50 (H) 03/25/2021   RBCU 0-5 03/25/2021   BACTERIA MANY (A) 03/25/2021   CULT >=100,000 COLONIES/mL ESCHERICHIA COLI (A) 03/25/2021    Patient is scheduled for and elective COMPUTER ASSISTED RIGHT TOTAL KNEE ARTHROPLASTY on 04/08/2021.    UA performed in PAT consistent with infection.  No leukocytosis noted on CBC; WBC 6000 Renal function: Estimated Creatinine Clearance: 49.4 mL/min (A) (by C-G formula based on SCr of 1.11 mg/dL (H)). Urine C&S added to assess for pathogenically significant growth.  Impression and Plan:  BESS SALTZMAN with a UA that was (+) for infection. Reflex culture sent that grew out pathogenically significant Escherichia coli isolate. Contacted patient to discuss. Patient reporting that she is experiencing minor LUTS (frequency and dysuria). She denies abdominal/flank/back pain, nausea, vomiting, and fevers. She does not have a history of frequent/recurrent infections. Patient with surgery scheduled soon. In efforts to avoid delaying patient's procedure, I would like to proceed with treatment for  early urinary tract infection.  Allergies and sensitivity report reviewed. Will treat with a 5 day course of cephalexin. Patient encouraged to complete the entire course of antibiotics even if she begins to feel better.   Meds ordered this encounter  Medications   cephALEXin (KEFLEX) 500 MG capsule    Sig: Take 1 capsule (500 mg total) by mouth 2 (two) times daily for 5 days. Increase WATER intake while taking this medication.    Dispense:  10 capsule    Refill:  0   Patient encouraged to increase her fluid intake as much as possible. Discussed that water is always best to flush the urinary tract. She was advised to avoid caffeine containing fluids until her infections clears, as caffeine can cause her to experience painful bladder spasms.   May use Tylenol as needed for pain/fever.   Results and treatment plan of care forwarded to primary attending surgeon to make them aware.   Honor Loh, MSN, APRN, FNP-C, CEN [facility]  Peri-operative Services Nurse Practitioner Phone: [phone] Fax: [phone] 03/27/21 8:32 AM

## 2021-04-01 ENCOUNTER — Encounter: Payer: Self-pay | Admitting: Orthopedic Surgery

## 2021-04-02 ENCOUNTER — Other Ambulatory Visit: Payer: Self-pay

## 2021-04-02 ENCOUNTER — Encounter: Payer: Self-pay | Admitting: Orthopedic Surgery

## 2021-04-02 ENCOUNTER — Ambulatory Visit
Admission: EM | Admit: 2021-04-02 | Discharge: 2021-04-02 | Disposition: A | Discharge status: Home / Self Care | Payer: Medicare Other | Attending: Internal Medicine | Admitting: Internal Medicine

## 2021-04-02 DIAGNOSIS — I872 Venous insufficiency (chronic) (peripheral): Secondary | ICD-10-CM | POA: Diagnosis not present

## 2021-04-02 DIAGNOSIS — T361X5A Adverse effect of cephalosporins and other beta-lactam antibiotics, initial encounter: Secondary | ICD-10-CM

## 2021-04-02 MED ORDER — TRIAMCINOLONE ACETONIDE 0.1 % EX CREA: 1.0000 "application " | TOPICAL_CREAM | Freq: Two times a day (BID) | CUTANEOUS | 0 refills | Status: DC

## 2021-04-02 NOTE — ED Provider Notes (Signed)
[facility]    CSN: [id number] Arrival date & time: 04/02/21  1046      History   Chief Complaint Chief Complaint  Patient presents with   Rash    HPI Nina Perez is a 80 y.o. female.  She presents today with lower leg swelling, pain, redness, and blotchy rash that started a few days ago.  She has just completed on 11/21 a 5-day course of Keflex for UTI.  She is scheduled for knee replacement surgery on 11/28 with Dr. Marry Guan.  The lower leg redness and blotchiness are already improved, since taking her last dose of Keflex yesterday.  There is no broken skin or seeping.  She has no fever, myalgias, malaise, and no rash/skin change elsewhere.  Has history of leg swelling, and has compression hose at home.  Uses oxygen intermittently at home.  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   Nocturia 12/18/2020   Acquired absence of other left toe(s) ([place]) 10/08/2020   Premature beats 07/03/2020   Dysuria 12/11/2019   Adenomatous colon polyp 11/21/2019   Phlebitis 11/21/2019   Renal cyst 11/21/2019   Swelling of lower extremity 10/26/2019   Hypomagnesemia 06/03/2019   CKD (chronic kidney disease) stage 3, GFR 30-59 ml/min ([place]) 06/02/2019   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   PAD (peripheral artery disease) ([place]) 05/29/2019   Critical lower limb ischemia ([place]) 05/16/2019   Open wound of left foot 03/27/2019   Decreased GFR 11/13/2018   Bladder incontinence 11/13/2018   Elevated TSH 11/13/2018   Thrombocytopenia ([place]) 07/03/2018   Acute right-sided thoracic back pain 06/16/2018   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   Pulmonary hypertension, mild ([place]) 03/04/2018   Murmur 08/16/2017   Lymphedema 03/04/2016   Lesion of neck 12/15/2015   Dizziness 12/15/2015   Venous stasis dermatitis of right  lower extremity 09/10/2015   Varicose veins of right lower extremity with inflammation 09/10/2015   Breast nodule 11/05/2014   Health care maintenance 11/02/2014   Obesity (BMI 30-39.9) 06/04/2014   Osteoarthritis 02/27/2014   Environmental allergies 01/15/2014   Diabetes mellitus ([place]) 05/22/2012   Hypertension 05/22/2012   Hypercholesterolemia 05/22/2012   Rheumatoid arthritis ([place]) 05/22/2012   Barrett's esophagus 05/22/2012   Gout 05/22/2012   GERD (gastroesophageal reflux disease) 05/22/2012   History of colon polyps 05/22/2012    Past Surgical History:  Procedure Laterality Date   ABDOMINAL AORTOGRAM W/LOWER EXTREMITY Left 05/16/2019   Procedure: ABDOMINAL  AORTOGRAM W/LOWER EXTREMITY;  Surgeon: Lorretta Harp, MD;  Location: [place] [facility];  Service: Cardiovascular;  Laterality: Left;   AMPUTATION TOE Left 06/02/2019   Procedure: AMPUTATION TOE;  Surgeon: Albertine Patricia, DPM;  Location: [facility];  Service: Podiatry;  Laterality: Left;   APPENDECTOMY  1972   BREAST CYST ASPIRATION Left    neg   INCISION AND DRAINAGE OF WOUND Left 06/07/2019   Procedure: IRRIGATION AND DEBRIDEMENT WOUND;  Surgeon: Albertine Patricia, DPM;  Location: [facility];  Service: Podiatry;  Laterality: Left;   LOWER EXTREMITY ANGIOGRAPHY Left 06/03/2019   Procedure: Lower Extremity Angiography;  Surgeon: Katha Cabal, MD;  Location: [place] [facility];  Service: Cardiovascular;  Laterality: Left;   PERIPHERAL VASCULAR BALLOON ANGIOPLASTY Left 05/16/2019   Procedure: PERIPHERAL VASCULAR BALLOON ANGIOPLASTY;  Surgeon: Lorretta Harp, MD;  Location: [place] [facility];  Service: Cardiovascular;  Laterality: Left;  SFA   TUBAL LIGATION  1972   VEIN LIGATION AND STRIPPING         Home Medications    Prior to Admission medications   Medication Sig Start Date End Date Taking? Authorizing Provider  acetaminophen (TYLENOL) 650 MG CR tablet Take 650-1,300 mg by mouth every 8 (eight) hours as needed for pain.   Yes [provider]  allopurinol (ZYLOPRIM) 300 MG tablet Take 1 tablet by mouth once daily 02/07/21  Yes Einar Pheasant, MD  amLODipine (NORVASC) 10 MG tablet TAKE 1 TABLET BY MOUTH DAILY 09/17/20  Yes Einar Pheasant, MD  aspirin 81 MG tablet Take 81 mg by mouth daily. Reported on 07/24/2015   Yes [provider]  atorvastatin (LIPITOR) 40 MG tablet Take 1 tablet by mouth once daily 01/16/21  Yes Einar Pheasant, MD  clopidogrel (PLAVIX) 75 MG tablet Take 1 tablet by mouth once daily with breakfast 09/17/20  Yes Lorretta Harp, MD  fexofenadine (ALLEGRA) 180 MG tablet Take 1 tablet (180 mg total) by mouth daily. Patient taking  differently: Take 180 mg by mouth daily as needed for allergies. 09/10/20  Yes Margarette Canada, NP  folic acid (FOLVITE) 1 MG tablet Take 1 mg by mouth every evening.    Yes [provider]  glucose blood (BAYER CONTOUR TEST) test strip USE  STRIP TO CHECK GLUCOSE TWICE DAILY. Dx: E11.9 05/29/17  Yes Einar Pheasant, MD  ipratropium (ATROVENT) 0.06 % nasal spray Place 2 sprays into both nostrils 4 (four) times daily. Patient taking differently: Place 2 sprays into both nostrils 4 (four) times daily as needed for rhinitis. 09/10/20  Yes Margarette Canada, NP  losartan (COZAAR) 100 MG tablet Take 1 tablet by mouth once daily 01/16/21  Yes Einar Pheasant, MD  metFORMIN (GLUCOPHAGE) 500 MG tablet Take 1 tablet by mouth once daily 01/02/21  Yes Einar Pheasant, MD  methotrexate (RHEUMATREX) 2.5 MG tablet Take 10 mg by mouth every Wednesday.  01/10/20  Yes [provider]  metoprolol succinate (TOPROL-XL) 50 MG 24 hr tablet TAKE 1 TABLET BY MOUTH ONCE DAILY IMMEDIATELY FOLLOWING A MEAL 03/25/21  Yes Einar Pheasant, MD  MICROLET LANCETS MISC Check sugar once daily, [employer] Microlet Lancets. Dx E11.9 10/02/14  Yes Einar Pheasant, MD  pantoprazole (PROTONIX) 40 MG tablet Take 1 tablet by mouth once daily 12/17/20  Yes Einar Pheasant, MD  triamcinolone cream (KENALOG) 0.1 % Apply 1 application topically 2 (two) times daily. To lower legs before putting on and/or after removing compression hose for redness in legs 04/02/21  Yes Wynona Luna, MD  trospium (SANCTURA) 20 MG tablet Take 1 tablet (20 mg total) by mouth at bedtime. 12/17/20  Yes Stoioff, Ronda Fairly, MD    Family History Family History  Problem Relation Age of Onset   Arthritis Mother    Heart attack Father    Colon cancer Neg Hx    Breast cancer Neg Hx     Social History Social History   Tobacco Use   Smoking status: Former    Packs/day: 0.50    Years: 15.00    Pack years: 7.50    Types: Cigarettes    Quit date: 05/12/1988     Years since quitting: 32.9   Smokeless tobacco: Never  Vaping Use   Vaping Use: Never used  Substance Use Topics   Alcohol use: No    Alcohol/week: 0.0 standard drinks   Drug use: No     Allergies   Maxzide [triamterene-hctz], Tape, and Hydrochlorothiazide w-triamterene   Review of Systems Review of Systems see HPI   Physical Exam Triage Vital Signs ED Triage Vitals  Enc Vitals Group     BP 04/02/21 1212 (!) 149/76     Pulse Rate 04/02/21 1212 75     Resp 04/02/21 1212 18     Temp 04/02/21 1212 97.8 F (36.6 C)     Temp Source 04/02/21 1212 Oral     SpO2 04/02/21 1212 94 %     Weight 04/02/21 1207 223 lb (101.2 kg)     Height 04/02/21 1207 5\' 6"  (1.676 m)     Pain Score 04/02/21 1207 8     Pain Loc --     Updated Vital Signs BP (!) 149/76 (BP Location: Left Arm)   Pulse 75   Temp 97.8 F (36.6 C) (Oral)   Resp 18   Ht 5\' 6"  (1.676 m)   Wt 101.2 kg   LMP 05/25/1988   SpO2 94%   BMI 35.99 kg/m    Physical Exam Constitutional:      General: She is not in acute distress.    Appearance: She is not ill-appearing or diaphoretic.  HENT:     Head: Atraumatic.  Cardiovascular:     Rate and Rhythm: Normal rate.  Pulmonary:     Effort: Pulmonary effort is normal.  Musculoskeletal:     Comments: Patient is in a wheelchair Uses O2 at home episodically  Skin:    General: Skin is warm and dry.     Coloration: Skin is not cyanotic.     Comments: Bilateral lower extremities below the knee with 3-4+ pitting edema, some tenderness to palpation.  There is bright erythema as in the diagram, blanching.  There is vague blotchiness extending to the knees.  Patient and husband are very clear that the rash and swelling are improved compared to yesterday, even though she has been up and about more than usual (often  sits in her recliner and has legs elevated)  Neurological:     Mental Status: She is alert.  Psychiatric:     Comments: Speech is clear,  coherent, logical      UC Treatments / Results  Labs (all labs ordered are listed, but only abnormal results are displayed) Labs Reviewed - No data to display No labs done/indicated at visit  Radiology No results found. No imaging done/indicated at visit  Medications Ordered in UC Medications - No data to display No meds given at urgent care    Final Clinical Impressions(s) / UC Diagnoses   Final diagnoses:  Stasis dermatitis of both legs  Cephalexin adverse reaction, initial encounter     Discharge Instructions      Leg swelling/redness and leg rash today seem most likely related to a recent short course of cephalexin for urinary tract infection.  The cephalexin was finished yesterday, and the leg swelling/redness/rash seem to already be improving.  Would elevate legs as much as you can, wear compression stockings daily (from time you get out of bed in morning until going to bed in evening) and use a steroid cream (prescription for triamcinolone cream sent to pharmacy) to legs 1-2 times daily.  Anticipate gradual improvement over the next few days.  I do not currently appreciate infection or a reason to delay knee surgery on 11/28.  Recheck or followup with your PCP or Dr Clydell Hakim office if fever occurs, or worsening leg redness/swelling/pain.      ED Prescriptions     Medication Sig Dispense Auth. Provider   triamcinolone cream (KENALOG) 0.1 % Apply 1 application topically 2 (two) times daily. To lower legs before putting on and/or after removing compression hose for redness in legs 45 g Wynona Luna, MD      PDMP not reviewed this encounter.   Wynona Luna, MD 04/03/21 [phone]

## 2021-04-02 NOTE — Discharge Instructions (Signed)
Leg swelling/redness and leg rash today seem most likely related to a recent short course of cephalexin for urinary tract infection.  The cephalexin was finished yesterday, and the leg swelling/redness/rash seem to already be improving.  Would elevate legs as much as you can, wear compression stockings daily (from time you get out of bed in morning until going to bed in evening) and use a steroid cream (prescription for triamcinolone cream sent to pharmacy) to legs 1-2 times daily.  Anticipate gradual improvement over the next few days.  I do not currently appreciate infection or a reason to delay knee surgery on 11/28.  Recheck or followup with your PCP or Dr Clydell Hakim office if fever occurs, or worsening leg redness/swelling/pain.

## 2021-04-02 NOTE — Progress Notes (Signed)
Perioperative Services  Pre-Admission/Anesthesia Testing Clinical Review  Date: 04/02/21  Patient Demographics:  Name: Nina Perez DOB:   1940/08/17 MRN:   [id number]  Planned Surgical Procedure(s):    Case: [id number] Date/Time: 04/08/21 0700   Procedure: COMPUTER ASSISTED TOTAL KNEE ARTHROPLASTY (Right: Knee)   Anesthesia type: Choice   Pre-op diagnosis: PRIMARY OSTEOARTHRITIS OF RIGHT KNEE.   Location: [facility] OR ROOM 01 / [facility]   Surgeons: Donato Heinz, MD   NOTE: Available PAT nursing documentation and vital signs have been reviewed. Clinical nursing staff has updated patient's PMH/PSHx, current medication list, and drug allergies/intolerances to ensure comprehensive history available to assist in medical decision making as it pertains to the aforementioned surgical procedure and anticipated anesthetic course. Extensive review of available clinical information performed. [place] PMH and PSHx updated with any diagnoses/procedures that  may have been inadvertently omitted during her intake with the pre-admission testing department's nursing staff.  Clinical Discussion:  Nina Perez is a 80 y.o. female who is submitted for pre-surgical anesthesia review and clearance prior to her undergoing the above procedure. Patient is a Former Smoker (7.5 pack years; quit 05/1988). Pertinent PMH includes: LBBB, cardiac murmur, pulmonary hypertension, aortic atherosclerosis, PAD, HTN, HLD, T2DM, CKD-III, ILD/mild pulmonary fibrosis, DOE (uses supplemental oxygen), OSAH (requires nocturnal PAP therapy), GERD (on daily PPI), Barrett's esophagus, RA, OA, scleroderma (with associated CREST syndrome).   Patient is followed by cardiology Gwen Pounds, MD). She was last seen in the cardiology clinic on 11/26/2020; notes reviewed.  At the time of her clinic visit, patient reporting worsening dyspnea over the course of the preceding months. She denied any episodes of associated chest pain.   Patient denied any episodes of PND, orthopnea, palpitations, significant peripheral edema, vertiginous symptoms, or presyncope/syncope.  PMH significant for cardiovascular/cardiopulmonary diagnoses.  TTE performed on 09/02/2017 revealed normal left ventricular systolic function with an estimated EF of >55%.  There was mild LVH, mild right ventricular enlargement, and mild biatrial enlargement.  Trivial aortic/pulmonic and mild mitral/tricuspid valve regurgitation noted there was no significant transvalvular gradient to suggest stenosis.  Study revealed paradoxical septal wall motion consistent with LBBB.  Myocardial perfusion imaging study performed on 11/05/2020 revealed an LVEF of 56%. There were no regional wall motion abnormalities.  There was no evidence of stress-induced myocardial ischemia or arrhythmia.  Study indeterminate overall due to baseline EKG changes (LBBB).  Patient underwent an abdominal aortogram with lower extremity peripheral vascular balloon angioplasty on 05/16/2019. Study revealed that the abdominal aorta renal arteries were widely patent.  The infrarenal abdominal aorta was free of significant atherosclerotic changes.  Iliac arteries appeared normal bilaterally.  In the left lower extremity, there was a 60% lesion of the mid LEFT SFA with one-vessel runoff via the peroneal.  The peroneal gave collaterals to the dorsalis pedis at the level of the ankle.  POBA performed resulting in a 0% residual stenosis, no evidence of dissection, and excellent angiographic flow.  Given PVD, patient on daily DAPT therapy (ASA + clopidogrel); compliant with therapy with no evidence of GI bleeding.  Blood pressure reasonably controlled at 130/72 on currently prescribed CCB, ARB, and beta-blocker therapies.  Patient is on a statin for her HLD.  T2DM well-controlled on currently prescribed regimen; last Hgb A1c was 5.9% when checked on 03/25/2021.  Functional capacity limited by PVD, age, and  underlying cardiopulmonary diagnoses.  She is felt to be unable to achieve at least 4 METS of activity without angina/anginal equivalent symptoms.  No changes were made to patient's medication regimen.  Patient to follow-up with outpatient cardiology in 6 months or sooner if needed.  [name] is scheduled for an elective RIGHT COMPUTER ASSISTED TOTAL KNEE ARTHROPLASTY on 04/08/2021 with Dr. Francesco Sor, MD.  Given patient's past medical history significant for cardiovascular diagnoses, presurgical cardiac clearance was sought by the PAT team. Per cardiology, "this patient is optimized for surgery and may proceed with the planned procedural course with a LOW risk of significant perioperative cardiovascular complications". Again, this patient is on daily DAPT therapy. She has been instructed on recommendations from her cardiologist for holding both her ASA and clopidogrel for 4 days prior to her procedure with plans to restart as soon as postoperative bleeding risk felt to be minimized by her primary attending surgeon. The patient is aware that her last dose of these medications will be on 04/03/2021.  Patient denies previous perioperative complications with anesthesia in the past. In review of the available records, it is noted that patient underwent a general anesthetic course here (ASA III) in 05/2019 without documented complications.   Vitals with BMI 03/25/2021 01/15/2021 12/17/2020  Height 5\' 6"  5\' 6"  5\' 6"   Weight 223 lbs 14 oz 226 lbs 6 oz 218 lbs  BMI 36.16 36.56 35.2  Systolic 143 136 308  Diastolic 83 80 78  Pulse 71 75 60    Providers/Specialists:   NOTE: Primary physician provider listed below. Patient may have been seen by APP or partner within same practice.   PROVIDER ROLE / SPECIALTY LAST OV  Hooten, Illene Labrador, MD Orthopedics 03/28/2021  Dale [place], MD Primary Care Provider 01/15/2021  Arnoldo Hooker, MD Cardiology 11/26/2020  Tamera Punt, MD Rheumatology  03/19/2021  Vida Rigger, MD Pulmonary Medicine 01/10/2021   Allergies:  Maxzide [triamterene-hctz], Tape, and Hydrochlorothiazide w-triamterene  Current Home Medications:   No current facility-administered medications for this encounter.    acetaminophen (TYLENOL) 650 MG CR tablet   allopurinol (ZYLOPRIM) 300 MG tablet   amLODipine (NORVASC) 10 MG tablet   aspirin 81 MG tablet   atorvastatin (LIPITOR) 40 MG tablet   Camphor-Menthol-Methyl Sal (SALONPAS DEEP RELIEVING) 3.05-21-13 % GEL   clopidogrel (PLAVIX) 75 MG tablet   fexofenadine (ALLEGRA) 180 MG tablet   folic acid (FOLVITE) 1 MG tablet   ipratropium (ATROVENT) 0.06 % nasal spray   losartan (COZAAR) 100 MG tablet   metFORMIN (GLUCOPHAGE) 500 MG tablet   methotrexate (RHEUMATREX) 2.5 MG tablet   pantoprazole (PROTONIX) 40 MG tablet   glucose blood (BAYER CONTOUR TEST) test strip   magnesium oxide (MAG-OX) 400 (241.3 Mg) MG tablet   metoprolol succinate (TOPROL-XL) 50 MG 24 hr tablet   MICROLET LANCETS MISC   trospium (SANCTURA) 20 MG tablet   History:   Past Medical History:  Diagnosis Date   Aortic atherosclerosis (HCC)    Barrett's esophagus    Cardiac murmur    CKD (chronic kidney disease), stage III (HCC)    Dyspnea on exertion    GERD (gastroesophageal reflux disease)    Gout    History of 2019 novel coronavirus disease (COVID-19) 08/2020   History of colon cancer    adenomatous polyps   Hypercholesterolemia    Hypertension    ILD (interstitial lung disease) (HCC)    a.) 2/2 scleroderma Dx   Inflammatory arthritis    LBBB (left bundle branch block)    Long term current use of antithrombotics/antiplatelets    a.) ASA + clopidogrel   Long  term current use of immunosuppressive drug    a.) MTX for RA and scleroderma Dx   OSA on CPAP    Osteoarthritis    knees, spine   PAD (peripheral artery disease) (HCC)    Phlebitis    x2 (with pregnancy)   Pulmonary fibrosis (HCC)    mild   Pulmonary  hypertension (HCC)    Reflux esophagitis    Rheumatoid arthritis(714.0)    positive RF, FANA, RNP, negative CCP ab and anti DNA,.  neg anti-SCL 70   Scleroderma (HCC)    a.) with (+) CREST syndrome --> raynaud's, sclerodactyly, telangiectasias   Skin cancer 2018   Supplemental oxygen dependent    a.) 2L/[place] PRN for DOE related to mild pulmonary fibrosis and pulmonary HTN   T2DM (type 2 diabetes mellitus) (HCC)    Past Surgical History:  Procedure Laterality Date   ABDOMINAL AORTOGRAM W/LOWER EXTREMITY Left 05/16/2019   Procedure: ABDOMINAL AORTOGRAM W/LOWER EXTREMITY;  Surgeon: Runell Gess, MD;  [facility];  Service: Cardiovascular;  Laterality: Left;   AMPUTATION TOE Left 06/02/2019   Procedure: AMPUTATION TOE;  Surgeon: Recardo Evangelist, DPM;  Location: [facility] ORS;  Service: Podiatry;  Laterality: Left;   APPENDECTOMY  1972   BREAST CYST ASPIRATION Left    neg   INCISION AND DRAINAGE OF WOUND Left 06/07/2019   Procedure: IRRIGATION AND DEBRIDEMENT WOUND;  Surgeon: Recardo Evangelist, DPM;  Location: [facility] ORS;  Service: Podiatry;  Laterality: Left;   LOWER EXTREMITY ANGIOGRAPHY Left 06/03/2019   Procedure: Lower Extremity Angiography;  Surgeon: Renford Dills, MD;  Location: [facility];  Service: Cardiovascular;  Laterality: Left;   PERIPHERAL VASCULAR BALLOON ANGIOPLASTY Left 05/16/2019   Procedure: PERIPHERAL VASCULAR BALLOON ANGIOPLASTY;  Surgeon: Runell Gess, MD;  Location: [facility];  Service: Cardiovascular;  Laterality: Left;  SFA   TUBAL LIGATION  1972   VEIN LIGATION AND STRIPPING     Family History  Problem Relation Age of Onset   Arthritis Mother    Heart attack Father    Colon cancer Neg Hx    Breast cancer Neg Hx    Social History   Tobacco Use   Smoking status: Former    Packs/day: 0.50    Years: 15.00    Pack years: 7.50    Types: Cigarettes    Quit date: 05/12/1988    Years since quitting: 32.9   Smokeless  tobacco: Never  Vaping Use   Vaping Use: Never used  Substance Use Topics   Alcohol use: No    Alcohol/week: 0.0 standard drinks   Drug use: No    Pertinent Clinical Results:  LABS: Labs reviewed: Acceptable for surgery.  Hospital Outpatient Visit on 03/25/2021  Component Date Value Ref Range Status   ABO/RH(D) 03/25/2021 B NEG   Final   Antibody Screen 03/25/2021 NEG   Final   Sample Expiration 03/25/2021 04/08/2021, 2359   Final   Extend sample reason 03/25/2021    Final                   Value:NO TRANSFUSIONS OR PREGNANCY IN THE PAST 3 MONTHS Performed at [facility], [address] [place], [place] [address]   CRP 03/25/2021 1.0 (H)  <1.0 mg/dL Final   Performed at [facility], [address] [place], [place] [address]   Hgb A1c MFr Bld 03/25/2021 5.9 (H)  4.8 - 5.6 % Final   Comment: (NOTE) Pre  diabetes:          5.7%-6.4% Diabetes:              >6.4% Glycemic control for  adults with diabetes <7.0%   Mean Plasma Glucose 03/25/2021 122.63  mg/dL Final   Performed at [facility], [address] [place], [place] [address]   Sed Rate 03/25/2021 14  0 - 30 mm/hr Final   Performed at [facility], [address] [place], [place] [address]   MRSA, PCR 03/25/2021 NEGATIVE  NEGATIVE Final   Staphylococcus aureus 03/25/2021 NEGATIVE  NEGATIVE Final   Comment: (NOTE) The Xpert SA Assay (FDA approved for NASAL specimens in patients 38 years of age and older), is one component of a comprehensive surveillance program. It is not intended to diagnose infection nor to guide or monitor treatment. Performed at [facility], [address] [place], [place] [address]   WBC 03/25/2021 6.0  4.0 - 10.5 K/uL Final   RBC 03/25/2021 3.80 (L)  3.87 - 5.11 MIL/uL Final   Hemoglobin 03/25/2021 12.9  12.0 - 15.0 g/dL Final   HCT 13/12/6576 40.7  36.0 - 46.0 % Final   MCV 03/25/2021 107.1 (H)  80.0 - 100.0 fL Final   MCH 03/25/2021 33.9  26.0 - 34.0 pg  Final   MCHC 03/25/2021 31.7  30.0 - 36.0 g/dL Final   RDW 46/96/2952 17.1 (H)  11.5 - 15.5 % Final   Platelets 03/25/2021 186  150 - 400 K/uL Final   nRBC 03/25/2021 0.0  0.0 - 0.2 % Final   Performed at [facility], [address] [place], [place] [address]   Sodium 03/25/2021 139  135 - 145 mmol/L Final   Potassium 03/25/2021 3.7  3.5 - 5.1 mmol/L Final   Chloride 03/25/2021 106  98 - 111 mmol/L Final   CO2 03/25/2021 26  22 - 32 mmol/L Final   Glucose, Bld 03/25/2021 117 (H)  70 - 99 mg/dL Final   Glucose reference range applies only to samples taken after fasting for at least 8 hours.   BUN 03/25/2021 29 (H)  8 - 23 mg/dL Final   Creatinine, Ser 03/25/2021 1.11 (H)  0.44 - 1.00 mg/dL Final   Calcium 44/05/270 9.4  8.9 - 10.3 mg/dL Final   Total Protein 53/66/4403 7.8  6.5 - 8.1 g/dL Final   Albumin 47/42/5956 3.8  3.5 - 5.0 g/dL Final   AST 38/75/6433 23  15 - 41 U/L Final   ALT 03/25/2021 17  0 - 44 U/L Final   Alkaline Phosphatase 03/25/2021 84  38 - 126 U/L Final   Total Bilirubin 03/25/2021 1.1  0.3 - 1.2 mg/dL Final   GFR, Estimated 03/25/2021 51 (L)  >60 mL/min Final   Comment: (NOTE) Calculated using the CKD-EPI Creatinine Equation (2021)   Anion gap 03/25/2021 7  5 - 15 Final   Performed at [facility], [address] [place], [place] [address]   Prothrombin Time 03/25/2021 13.7  11.4 - 15.2 seconds Final   INR 03/25/2021 1.1  0.8 - 1.2 Final   Comment: (NOTE) INR goal varies based on device and disease states. Performed at [facility], [address] [place], [place] [address]    aPTT 03/25/2021 37 (H)  24 - 36 seconds Final   Comment:        IF BASELINE aPTT IS ELEVATED, SUGGEST PATIENT RISK ASSESSMENT BE USED TO DETERMINE APPROPRIATE ANTICOAGULANT THERAPY. Performed at [facility], [address] [place], [place] [address]   Color, Urine 03/25/2021 YELLOW (A)  YELLOW Final   APPearance 03/25/2021 CLOUDY (A)  CLEAR  Final   Specific Gravity, Urine 03/25/2021 1.019  1.005 - 1.030 Final   pH 03/25/2021 5.0  5.0 - 8.0 Final   Glucose, UA 03/25/2021 NEGATIVE  NEGATIVE mg/dL Final   Hgb urine dipstick 03/25/2021 NEGATIVE  NEGATIVE Final   Bilirubin Urine 03/25/2021 NEGATIVE  NEGATIVE Final   Ketones, ur 03/25/2021 NEGATIVE  NEGATIVE mg/dL Final   Protein, ur 60/45/4098 30 (A)  NEGATIVE mg/dL Final   Nitrite 11/91/4782 NEGATIVE  NEGATIVE Final   Leukocytes,Ua 03/25/2021 LARGE (A)  NEGATIVE Final   RBC / HPF 03/25/2021 0-5  0 - 5 RBC/hpf Final   WBC, UA 03/25/2021 >50 (H)  0 - 5 WBC/hpf Final   Bacteria, UA 03/25/2021 MANY (A)  NONE SEEN Final   Squamous Epithelial / LPF 03/25/2021 6-10  0 - 5 Final   WBC Clumps 03/25/2021 PRESENT   Final   Mucus 03/25/2021 PRESENT   Final   Hyaline Casts, UA 03/25/2021 PRESENT   Final   Amorphous Crystal 03/25/2021 PRESENT   Final   Non Squamous Epithelial 03/25/2021 PRESENT (A)  NONE SEEN Final   Performed at [facility], [address] [place], [place] [address]   Component Value Date   CULT >=100,000 COLONIES/mL ESCHERICHIA COLI (A) 03/25/2021    ECG: Date: 01/15/2021 Time ECG obtained: 0931 AM Rate: 66 bpm Rhythm:  SR with 1st degree AVB and frequent PVCs; LBBB Axis (leads I and aVF): Left axis deviation Intervals: PR 224 ms. QRS 153 ms. QTc 448 ms. ST segment and T wave changes: No evidence of acute ST segment elevation or depression Comparison: Similar to previous tracing obtained on 07/03/2020   IMAGING / PROCEDURES: DIAGNOSTIC RADIOGRAPHS OF RIGHT KNEE 3 VIEWS performed on 03/28/2021 Severe loss of lateral compartment joint space with significant osteophyte formation.   No fractures or osseous abnormalities noted.  MYOCARDIAL PERFUSION IMAGING STUDY (LEXISCAN) performed on 11/05/2020 LVEF 56% Normal myocardial thickening and wall motion No artifacts noted Left ventricular cavity size normal No evidence of stress-induced myocardial  ischemia or arrhythmia The overall quality of the study is good  VASCULAR ULTRASOUND ABI WITH/WITHOUT TBI performed on 02/24/2020 Right:  Resting right ankle-brachial index is within normal range.  No evidence of significant right lower extremity arterial disease.  The right toe-brachial index is normal.  Left:  Resting left ankle-brachial index is within normal range.  No evidence of significant left lower extremity arterial disease.  The left toe-brachial index is abnormal.   ABDOMINAL AORTOGRAM WITH LOWER EXTREMITY PERIPHERAL VASCULAR BALLOON ANGIOPLASTY performed on 05/16/2019 Abdominal aorta renal arteries were widely patent.   The infrarenal abdominal aorta was free of significant atherosclerotic changes.   Iliac arteries appeared normal bilaterally.   In the left lower extremity, there was a 60% lesion of the mid LEFT SFA with one-vessel runoff via the peroneal.  The peroneal gave collaterals to the dorsalis pedis at the level of the ankle.   POBA performed resulting in a 0% residual stenosis, no evidence of dissection, and excellent angiographic flow.  Impression and Plan:  Nina Perez has been referred for pre-anesthesia review and clearance prior to her undergoing the planned anesthetic and procedural courses. Available labs, pertinent testing, and imaging results were personally reviewed by me. This patient has been appropriately cleared by cardiology with an overall LOW risk of significant perioperative  cardiovascular complications.  Patient found to have pathogenically significant urinary tract infection by culture. In efforts to sterilize urine prior to upcoming elective orthopedic procedure, elected to proceed with treatment with a 5-day course of cephalexin.  Prescription has been called into patient's pharmacy and she has been notified.  Encouraged to continue medication through completion and to call either PAT or surgeon's office with any questions, concerns, or worsening  symptoms.  Based on clinical review performed today (04/02/21), barring any significant acute changes in the patient's overall condition, it is anticipated that she will be able to proceed with the planned surgical intervention. Any acute changes in clinical condition may necessitate her procedure being postponed and/or cancelled. Patient will meet with anesthesia team (MD and/or CRNA) on the day of her procedure for preoperative evaluation/assessment. Questions regarding anesthetic course will be fielded at that time.   Pre-surgical instructions were reviewed with the patient during her PAT appointment and questions were fielded by PAT clinical staff. Patient was advised that if any questions or concerns arise prior to her procedure then she should return a call to PAT and/or her surgeon's office to discuss.  Quentin Mulling, MSN, APRN, FNP-C, CEN [facility]  Peri-operative Services Nurse Practitioner Phone: [phone] Fax: [phone] 04/02/21 10:07 AM  NOTE: This note has been prepared using Dragon dictation software. Despite my best ability to proofread, there is always the potential that unintentional transcriptional errors may still occur from this process.

## 2021-04-02 NOTE — ED Triage Notes (Signed)
Pt c/o rash since Saturday. Pt is finished taking a new medication Cephalexin (5 day prescription) on Sunday 03/31/21.

## 2021-04-03 ENCOUNTER — Other Ambulatory Visit
Admission: RE | Admit: 2021-04-03 | Discharge: 2021-04-03 | Disposition: A | Discharge status: Home / Self Care | Payer: Medicare Other | Source: Ambulatory Visit | Attending: Orthopedic Surgery | Admitting: Orthopedic Surgery

## 2021-04-03 DIAGNOSIS — Z20822 Contact with and (suspected) exposure to covid-19: Secondary | ICD-10-CM | POA: Diagnosis not present

## 2021-04-03 DIAGNOSIS — Z01812 Encounter for preprocedural laboratory examination: Secondary | ICD-10-CM | POA: Diagnosis present

## 2021-04-04 LAB — SARS CORONAVIRUS 2 (TAT 6-24 HRS): SARS Coronavirus 2: NEGATIVE

## 2021-04-07 NOTE — H&P (Signed)
ORTHOPAEDIC HISTORY & PHYSICAL Gwenlyn Fudge, [place] - 03/28/2021 2:45 PM EST Formatting of this note is different from the original. [place] Chief Complaint:   Chief Complaint  Patient presents with   Right Knee - Pain  History & Physical Right TKA 04/08/21 [facility]   History of Present Illness:   Nina Perez is a 80 y.o. female that presents to clinic today for her preoperative history and evaluation. The patient is scheduled to undergo a right total knee arthroplasty on 04/08/21 by Dr. Marry Guan. Her pain began several years ago. The pain is located primarily along the lateral aspect of the knee. She describes her pain as worse with weightbearing. She reports associated swelling with some locking of the knee. She denies associated numbness or tingling, denies giving way of the knee.   The patient's symptoms have progressed to the point that they decrease her quality of life. The patient has previously undergone conservative treatment including NSAIDS and injections to the knee without adequate control of her symptoms.  Patient does see Dr Jefm Bryant for rheumatology and takes methotrexate. Does see Dr Erin Fulling for cardiology and has received cardiac clearance. Patient also followed by Dr. Lanney Gins in pulmonology for pulmonary fibrosis/pulmonary hypertension.  Last A1c 5.9 on 03/25/2021.  Denies history of lumbar surgery. No significant drug allergies.   Past Medical, Surgical, Family, Social History, Allergies, Medications:   Past Medical History:  Past Medical History:  Diagnosis Date   Adenomatous colon polyp   Chicken pox   COVID-19 08/2020   Diabetes mellitus type 2, uncomplicated (CMS-HCC)   GERD (gastroesophageal reflux disease)   Gout   History of Barrett's esophagus   Hypercholesterolemia   Hypertension   Obesity   Osteoarthritis ([place]) 02/27/2014  a. Knees. b. Spine.   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 Raynaud's. b. Sclerodactyly, telangiectasias c. Positive rheumatoid factor, FANA, RNP. Negative anti-CCP antibody and anti-DNA. Negative anti-SCL 70. d. Inflammatory arthritis. e. Mild pulmonary fibrosis f. Pulmonary hypertension   Past Surgical History:  Past Surgical History:  Procedure Laterality Date   COLONOSCOPY 08/20/2004  Adenomatous Polyp   COLONOSCOPY 05/14/2012, 02/02/2008  PH Adenomatous Polyp: CBF 05/2017; Recall Ltr mailed 04/16/2017 (dw)   EGD 05/14/2012, 02/02/2008, 08/20/2004, 01/15/1999  Barrett's Esophagus: CBF 05/2015; Recall Ltr mailed 04/13/2015 (dw); 2nd Recall Ltr mailed 04/16/2017 (dw)   LEFT FOURTH TOE AMPUTATION Left   VASCULAR SURGERY BILATERAL LEG   WISDOM TEETH   Current Medications:  Current Outpatient Medications  Medication Sig Dispense Refill   acetaminophen (TYLENOL) 325 MG tablet Take 650 mg by mouth every 4 (four) hours as needed for Pain.   allopurinol (ZYLOPRIM) 300 MG tablet Take 300 mg by mouth once daily.   amLODIPine (NORVASC) 10 MG tablet Take 10 mg by mouth once daily.   aspirin 81 MG chewable tablet Take 81 mg by mouth once daily.   atorvastatin (LIPITOR) 40 MG tablet Take 40 mg by mouth once daily   cephalexin (KEFLEX) 500 MG capsule Take by mouth   clopidogreL (PLAVIX) 75 mg tablet Take 1 tablet (75 mg total) by mouth daily with breakfast 90 tablet 1   fexofenadine (ALLEGRA) 60 MG tablet Take 60 mg by mouth 2 (two) times daily   folic acid (FOLVITE) 1 MG tablet Take 1 tablet by mouth once daily 90 tablet 0   ipratropium (  ATROVENT) 21 mcg (0.03 %) nasal spray Place 2 sprays into both nostrils 2 (two) times daily   losartan (COZAAR) 100 MG tablet Take 100 mg by mouth once daily   metFORMIN (GLUCOPHAGE) 500 MG tablet Take 500 mg by mouth once daily.   methotrexate (RHEUMATREX) 2.5 MG tablet TAKE 4 TABLETS BY MOUTH EVERY 7  DAYS 16 tablet 5   metoprolol succinate (TOPROL-XL) 50 MG XL tablet Take 50 mg by mouth once daily.   miscellaneous medical supply Misc INCENTIVE SPIROMETER - USE AS DIRECTED 1 each 1   pantoprazole (PROTONIX) 40 MG DR tablet Take 40 mg by mouth once daily   No current facility-administered medications for this visit.   Allergies:  Allergies  Allergen Reactions   Adhesive Tape-Silicones Other (See Comments)  Ob band aid adhesive   Maxzide [Triamterene-Hydrochlorothiazid] Other (See Comments)  Extreme fatigue/weakness   Social History:  Social History   Socioeconomic History   Marital status: Married  Spouse name: [name]   Number of children: 2   Years of education: 54  Occupational History   Occupation: Retired  Tobacco Use   Smoking status: Former  Years: 29.00  Types: Cigarettes  Quit date: 1990  Years since quitting: 32.9   Smokeless tobacco: Never  Vaping Use   Vaping Use: Never used  Substance and Sexual Activity   Alcohol use: No   Drug use: Never   Sexual activity: Defer  Partners: Male   Family History:  Family History  Problem Relation Age of Onset   Myocardial Infarction (Heart attack) Father   Myocardial Infarction (Heart attack) Mother   Arthritis Mother   No Known Problems Sister   No Known Problems Brother   Review of Systems:   A 10+ ROS was performed, reviewed, and the pertinent orthopaedic findings are documented in the HPI.   Physical Examination:   BP 128/84 (BP Location: Left upper arm, Patient Position: Sitting, BP Cuff Size: Adult)  Ht 167.6 cm (5\' 6" )  Wt 98.9 kg (218 lb)  BMI 35.19 kg/m   Patient is a well-developed, well-nourished female in no acute distress. Patient has normal mood and affect. Patient is alert and oriented to person, place, and time.   HEENT: Atraumatic, normocephalic. Pupils equal and reactive to light. Extraocular motion intact. Noninjected sclera.  Cardiovascular: Regular rate and rhythm, with systolic  murmur noted. Distal pulses auscultated with Doppler.  Respiratory: Lungs clear to auscultation bilaterally.   Right Knee: Soft tissue swelling: minimal Effusion: minimal Erythema: none Crepitance: moderate Tenderness: lateral Alignment: relative valgus Mediolateral laxity: lateral pseudolaxity Posterior sag: negative Patellar tracking: Good tracking without evidence of subluxation or tilt Atrophy: Generalized quadriceps atrophy.  Quadriceps tone was fair to good. Range of motion: 0/2/116 degrees  Sensation intact over the saphenous, lateral sural cutaneous, superficial fibular, and deep fibular nerve distributions.  Tests Performed/Reviewed:  X-rays  X-ray knee right 3 views  Result Date: 03/28/2021 Anteroposterior, lateral, and sunrise views of the right knee were obtained. Images reveal severe loss of lateral compartment joint space with significant osteophyte formation. No fractures or osseous abnormalities noted.   I personally ordered and interpreted today's x-rays.  Impression:   ICD-10-CM  1. Primary osteoarthritis of right knee M17.11   Plan:   The patient has end-stage degenerative changes of the right knee. It was explained to the patient that the condition is progressive in nature. Having failed conservative treatment, the patient has elected to proceed with a total joint arthroplasty. The patient will undergo a  total joint arthroplasty with Dr. Marry Guan. The risks of surgery, including blood clot and infection, were discussed with the patient. Measures to reduce these risks, including the use of anticoagulation, perioperative antibiotics, and early ambulation were discussed. The importance of postoperative physical therapy was discussed with the patient. The patient elects to proceed with surgery. The patient is instructed to stop all blood thinners prior to surgery. The patient is instructed to call the hospital the day before surgery to learn of the proper arrival  time.   Contact our office with any questions or concerns. Follow up as indicated, or sooner should any new problems arise, if conditions worsen, or if they are otherwise concerned.   Gwenlyn Fudge, [facility] [place], [place] [address] Phone: [phone]  This note was generated in part with voice recognition software and I apologize for any typographical errors that were not detected and corrected.  Electronically signed by Gwenlyn Fudge, PA at 04/01/2021 5:19 PM EST

## 2021-04-08 ENCOUNTER — Ambulatory Visit
Admission: RE | Admit: 2021-04-08 | Discharge: 2021-04-08 | Disposition: A | Discharge status: Home / Self Care | Payer: Medicare Other | Source: Ambulatory Visit | Attending: Orthopedic Surgery | Admitting: Orthopedic Surgery

## 2021-04-08 ENCOUNTER — Other Ambulatory Visit: Payer: Self-pay

## 2021-04-08 ENCOUNTER — Telehealth: Payer: Self-pay | Admitting: Internal Medicine

## 2021-04-08 ENCOUNTER — Ambulatory Visit (INDEPENDENT_AMBULATORY_CARE_PROVIDER_SITE_OTHER): Payer: Medicare Other | Admitting: Internal Medicine

## 2021-04-08 ENCOUNTER — Inpatient Hospital Stay: Payer: Medicare Other | Admitting: Urgent Care

## 2021-04-08 ENCOUNTER — Encounter: Payer: Self-pay | Admitting: Orthopedic Surgery

## 2021-04-08 ENCOUNTER — Encounter
Admission: RE | Disposition: A | Discharge status: Home / Self Care | Payer: Self-pay | Source: Ambulatory Visit | Attending: Orthopedic Surgery

## 2021-04-08 DIAGNOSIS — I129 Hypertensive chronic kidney disease with stage 1 through stage 4 chronic kidney disease, or unspecified chronic kidney disease: Secondary | ICD-10-CM | POA: Insufficient documentation

## 2021-04-08 DIAGNOSIS — Z7902 Long term (current) use of antithrombotics/antiplatelets: Secondary | ICD-10-CM | POA: Diagnosis not present

## 2021-04-08 DIAGNOSIS — Z7982 Long term (current) use of aspirin: Secondary | ICD-10-CM | POA: Insufficient documentation

## 2021-04-08 DIAGNOSIS — G4733 Obstructive sleep apnea (adult) (pediatric): Secondary | ICD-10-CM | POA: Diagnosis not present

## 2021-04-08 DIAGNOSIS — K219 Gastro-esophageal reflux disease without esophagitis: Secondary | ICD-10-CM | POA: Insufficient documentation

## 2021-04-08 DIAGNOSIS — E785 Hyperlipidemia, unspecified: Secondary | ICD-10-CM | POA: Diagnosis not present

## 2021-04-08 DIAGNOSIS — M1711 Unilateral primary osteoarthritis, right knee: Secondary | ICD-10-CM | POA: Diagnosis not present

## 2021-04-08 DIAGNOSIS — M069 Rheumatoid arthritis, unspecified: Secondary | ICD-10-CM

## 2021-04-08 DIAGNOSIS — Z9981 Dependence on supplemental oxygen: Secondary | ICD-10-CM | POA: Diagnosis not present

## 2021-04-08 DIAGNOSIS — N183 Chronic kidney disease, stage 3 unspecified: Secondary | ICD-10-CM | POA: Diagnosis not present

## 2021-04-08 DIAGNOSIS — E1159 Type 2 diabetes mellitus with other circulatory complications: Secondary | ICD-10-CM

## 2021-04-08 DIAGNOSIS — L539 Erythematous condition, unspecified: Secondary | ICD-10-CM | POA: Diagnosis not present

## 2021-04-08 DIAGNOSIS — E1151 Type 2 diabetes mellitus with diabetic peripheral angiopathy without gangrene: Secondary | ICD-10-CM | POA: Insufficient documentation

## 2021-04-08 DIAGNOSIS — I700 Atherosclerosis of aorta: Secondary | ICD-10-CM | POA: Insufficient documentation

## 2021-04-08 DIAGNOSIS — I272 Pulmonary hypertension, unspecified: Secondary | ICD-10-CM | POA: Diagnosis not present

## 2021-04-08 DIAGNOSIS — M7989 Other specified soft tissue disorders: Secondary | ICD-10-CM | POA: Diagnosis not present

## 2021-04-08 DIAGNOSIS — E1122 Type 2 diabetes mellitus with diabetic chronic kidney disease: Secondary | ICD-10-CM | POA: Insufficient documentation

## 2021-04-08 DIAGNOSIS — I10 Essential (primary) hypertension: Secondary | ICD-10-CM | POA: Diagnosis not present

## 2021-04-08 DIAGNOSIS — I70249 Atherosclerosis of native arteries of left leg with ulceration of unspecified site: Secondary | ICD-10-CM | POA: Diagnosis not present

## 2021-04-08 DIAGNOSIS — R300 Dysuria: Secondary | ICD-10-CM

## 2021-04-08 DIAGNOSIS — I739 Peripheral vascular disease, unspecified: Secondary | ICD-10-CM

## 2021-04-08 DIAGNOSIS — M175 Other unilateral secondary osteoarthritis of knee: Secondary | ICD-10-CM

## 2021-04-08 DIAGNOSIS — Z79899 Other long term (current) drug therapy: Secondary | ICD-10-CM | POA: Insufficient documentation

## 2021-04-08 DIAGNOSIS — D696 Thrombocytopenia, unspecified: Secondary | ICD-10-CM

## 2021-04-08 DIAGNOSIS — Z5309 Procedure and treatment not carried out because of other contraindication: Secondary | ICD-10-CM | POA: Diagnosis not present

## 2021-04-08 DIAGNOSIS — Z87891 Personal history of nicotine dependence: Secondary | ICD-10-CM | POA: Insufficient documentation

## 2021-04-08 DIAGNOSIS — N1831 Chronic kidney disease, stage 3a: Secondary | ICD-10-CM

## 2021-04-08 HISTORY — DX: Chronic kidney disease, stage 3 unspecified: N18.30

## 2021-04-08 HISTORY — DX: Left bundle-branch block, unspecified: I44.7

## 2021-04-08 HISTORY — DX: Long term (current) use of unspecified immunomodulators and immunosuppressants: Z79.60

## 2021-04-08 HISTORY — DX: Obstructive sleep apnea (adult) (pediatric): G47.33

## 2021-04-08 HISTORY — DX: Other forms of dyspnea: R06.09

## 2021-04-08 HISTORY — DX: Atherosclerosis of aorta: I70.0

## 2021-04-08 HISTORY — DX: Cardiac murmur, unspecified: R01.1

## 2021-04-08 HISTORY — DX: Gastro-esophageal reflux disease without esophagitis: K21.9

## 2021-04-08 HISTORY — DX: Long term (current) use of antithrombotics/antiplatelets: Z79.02

## 2021-04-08 HISTORY — DX: Interstitial pulmonary disease, unspecified: J84.9

## 2021-04-08 HISTORY — DX: Other long term (current) drug therapy: Z79.899

## 2021-04-08 HISTORY — DX: Dependence on supplemental oxygen: Z99.81

## 2021-04-08 HISTORY — DX: Peripheral vascular disease, unspecified: I73.9

## 2021-04-08 HISTORY — DX: Type 2 diabetes mellitus without complications: E11.9

## 2021-04-08 HISTORY — DX: Obstructive sleep apnea (adult) (pediatric): Z99.89

## 2021-04-08 LAB — GLUCOSE, CAPILLARY: Glucose-Capillary: 108 mg/dL — ABNORMAL HIGH (ref 70–99)

## 2021-04-08 LAB — ABO/RH: ABO/RH(D): B NEG

## 2021-04-08 SURGERY — ARTHROPLASTY, KNEE, TOTAL, USING IMAGELESS COMPUTER-ASSISTED NAVIGATION
Anesthesia: Choice

## 2021-04-08 MED ORDER — CEFAZOLIN SODIUM-DEXTROSE 2-4 GM/100ML-% IV SOLN: 2.0000 g | INTRAVENOUS | Status: DC

## 2021-04-08 MED ORDER — CHLORHEXIDINE GLUCONATE 4 % EX LIQD: 60.0000 mL | Freq: Once | CUTANEOUS | Status: DC

## 2021-04-08 MED ORDER — ORAL CARE MOUTH RINSE: 15.0000 mL | Freq: Once | OROMUCOSAL | Status: DC

## 2021-04-08 MED ORDER — CHLORHEXIDINE GLUCONATE 0.12 % MT SOLN
OROMUCOSAL | Status: AC
  Filled 2021-04-08: qty 15

## 2021-04-08 MED ORDER — GABAPENTIN 300 MG PO CAPS: 300.0000 mg | ORAL_CAPSULE | Freq: Once | ORAL | Status: DC

## 2021-04-08 MED ORDER — TRANEXAMIC ACID-NACL 1000-0.7 MG/100ML-% IV SOLN
INTRAVENOUS | Status: AC
  Filled 2021-04-08: qty 100

## 2021-04-08 MED ORDER — PROPOFOL 10 MG/ML IV BOLUS
INTRAVENOUS | Status: AC
  Filled 2021-04-08: qty 20

## 2021-04-08 MED ORDER — GABAPENTIN 300 MG PO CAPS
ORAL_CAPSULE | ORAL | Status: AC
  Filled 2021-04-08: qty 1

## 2021-04-08 MED ORDER — PROPOFOL 1000 MG/100ML IV EMUL
INTRAVENOUS | Status: AC
  Filled 2021-04-08: qty 100

## 2021-04-08 MED ORDER — ALBUMIN HUMAN 5 % IV SOLN
INTRAVENOUS | Status: AC
  Filled 2021-04-08: qty 250

## 2021-04-08 MED ORDER — BUPIVACAINE HCL (PF) 0.5 % IJ SOLN
INTRAMUSCULAR | Status: AC
  Filled 2021-04-08: qty 10

## 2021-04-08 MED ORDER — DEXAMETHASONE SODIUM PHOSPHATE 10 MG/ML IJ SOLN: 8.0000 mg | Freq: Once | INTRAMUSCULAR | Status: DC

## 2021-04-08 MED ORDER — CEFAZOLIN SODIUM-DEXTROSE 2-4 GM/100ML-% IV SOLN
INTRAVENOUS | Status: AC
  Filled 2021-04-08: qty 100

## 2021-04-08 MED ORDER — LIDOCAINE HCL (PF) 2 % IJ SOLN
INTRAMUSCULAR | Status: AC
  Filled 2021-04-08: qty 5

## 2021-04-08 MED ORDER — TRANEXAMIC ACID-NACL 1000-0.7 MG/100ML-% IV SOLN: 1000.0000 mg | INTRAVENOUS | Status: DC

## 2021-04-08 MED ORDER — DOXYCYCLINE HYCLATE 100 MG PO TABS: 100.0000 mg | ORAL_TABLET | Freq: Two times a day (BID) | ORAL | 0 refills | Status: DC

## 2021-04-08 MED ORDER — DEXAMETHASONE SODIUM PHOSPHATE 10 MG/ML IJ SOLN
INTRAMUSCULAR | Status: AC
  Filled 2021-04-08: qty 1

## 2021-04-08 MED ORDER — CELECOXIB 200 MG PO CAPS
ORAL_CAPSULE | ORAL | Status: AC
  Filled 2021-04-08: qty 1

## 2021-04-08 MED ORDER — PHENYLEPHRINE HCL-NACL 20-0.9 MG/250ML-% IV SOLN
INTRAVENOUS | Status: AC
  Filled 2021-04-08: qty 250

## 2021-04-08 MED ORDER — CELECOXIB 200 MG PO CAPS: 400.0000 mg | ORAL_CAPSULE | Freq: Once | ORAL | Status: DC

## 2021-04-08 MED ORDER — FENTANYL CITRATE (PF) 100 MCG/2ML IJ SOLN
INTRAMUSCULAR | Status: AC
  Filled 2021-04-08: qty 2

## 2021-04-08 MED ORDER — SODIUM CHLORIDE 0.9 % IV SOLN: INTRAVENOUS | Status: DC

## 2021-04-08 MED ORDER — ONDANSETRON HCL 4 MG/2ML IJ SOLN
INTRAMUSCULAR | Status: AC
  Filled 2021-04-08: qty 2

## 2021-04-08 MED ORDER — CHLORHEXIDINE GLUCONATE 0.12 % MT SOLN: 15.0000 mL | Freq: Once | OROMUCOSAL | Status: DC

## 2021-04-08 SURGICAL SUPPLY — 68 items
BATTERY INSTRU NAVIGATION (MISCELLANEOUS) ×12 IMPLANT
BLADE SAW 70X12.5 (BLADE) ×3 IMPLANT
BLADE SAW 90X13X1.19 OSCILLAT (BLADE) ×3 IMPLANT
BLADE SAW 90X25X1.19 OSCILLAT (BLADE) ×3 IMPLANT
COOLER POLAR GLACIER W/PUMP (MISCELLANEOUS) ×3 IMPLANT
CUFF TOURN SGL QUICK 24 (TOURNIQUET CUFF)
CUFF TOURN SGL QUICK 34 (TOURNIQUET CUFF)
CUFF TRNQT CYL 24X4X16.5-23 (TOURNIQUET CUFF) IMPLANT
CUFF TRNQT CYL 34X4.125X (TOURNIQUET CUFF) IMPLANT
DRAPE 3/4 80X56 (DRAPES) ×3 IMPLANT
DRAPE INCISE IOBAN 66X45 STRL (DRAPES) ×3 IMPLANT
DRSG DERMACEA 8X12 NADH (GAUZE/BANDAGES/DRESSINGS) ×3 IMPLANT
DRSG MEPILEX SACRM 8.7X9.8 (GAUZE/BANDAGES/DRESSINGS) ×3 IMPLANT
DRSG OPSITE POSTOP 4X14 (GAUZE/BANDAGES/DRESSINGS) ×3 IMPLANT
DRSG TEGADERM 4X4.75 (GAUZE/BANDAGES/DRESSINGS) ×3 IMPLANT
DURAPREP 26ML APPLICATOR (WOUND CARE) ×6 IMPLANT
ELECT CAUTERY BLADE 6.4 (BLADE) ×3 IMPLANT
ELECT REM PT RETURN 9FT ADLT (ELECTROSURGICAL) ×2
ELECTRODE REM PT RTRN 9FT ADLT (ELECTROSURGICAL) ×2 IMPLANT
EX-PIN ORTHOLOCK NAV 4X150 (PIN) ×6 IMPLANT
GLOVE SRG 8 PF TXTR STRL LF DI (GLOVE) ×2 IMPLANT
GLOVE SURG ENC TEXT LTX SZ7.5 (GLOVE) ×6 IMPLANT
GLOVE SURG UNDER POLY LF SZ7.5 (GLOVE) ×3 IMPLANT
GLOVE SURG UNDER POLY LF SZ8 (GLOVE) ×1
GOWN STRL REUS W/ TWL LRG LVL3 (GOWN DISPOSABLE) ×4 IMPLANT
GOWN STRL REUS W/ TWL XL LVL3 (GOWN DISPOSABLE) ×2 IMPLANT
GOWN STRL REUS W/TWL LRG LVL3 (GOWN DISPOSABLE) ×2
GOWN STRL REUS W/TWL XL LVL3 (GOWN DISPOSABLE) ×1
HEMOVAC 400CC 10FR (MISCELLANEOUS) ×3 IMPLANT
HOLDER FOLEY CATH W/STRAP (MISCELLANEOUS) ×3 IMPLANT
IRRIGATION SURGIPHOR STRL (IV SOLUTION) ×3 IMPLANT
IV NS IRRIG 3000ML ARTHROMATIC (IV SOLUTION) ×3 IMPLANT
KIT TURNOVER KIT A (KITS) ×3 IMPLANT
KNIFE SCULPS 14X20 (INSTRUMENTS) ×3 IMPLANT
LABEL OR SOLS (LABEL) ×3 IMPLANT
MANIFOLD NEPTUNE II (INSTRUMENTS) ×6 IMPLANT
NDL SAFETY ECLIPSE 18X1.5 (NEEDLE) ×2 IMPLANT
NDL SPNL 20GX3.5 QUINCKE YW (NEEDLE) ×2 IMPLANT
NEEDLE HYPO 18GX1.5 SHARP (NEEDLE) ×1
NEEDLE SPNL 20GX3.5 QUINCKE YW (NEEDLE) ×4 IMPLANT
NS IRRIG 500ML POUR BTL (IV SOLUTION) ×3 IMPLANT
PACK TOTAL KNEE (MISCELLANEOUS) ×3 IMPLANT
PAD ABD DERMACEA PRESS 5X9 (GAUZE/BANDAGES/DRESSINGS) ×6 IMPLANT
PAD WRAPON POLAR KNEE (MISCELLANEOUS) ×2 IMPLANT
PENCIL SMOKE EVACUATOR COATED (MISCELLANEOUS) ×3 IMPLANT
PIN DRILL FIX HALF THREAD (BIT) ×6 IMPLANT
PIN FIXATION 1/8DIA X 3INL (PIN) ×3 IMPLANT
PULSAVAC PLUS IRRIG FAN TIP (DISPOSABLE) ×2
SOL PREP PVP 2OZ (MISCELLANEOUS) ×2
SOLUTION PREP PVP 2OZ (MISCELLANEOUS) ×2 IMPLANT
SPONGE DRAIN TRACH 4X4 STRL 2S (GAUZE/BANDAGES/DRESSINGS) ×3 IMPLANT
SPONGE T-LAP 18X18 ~~LOC~~+RFID (SPONGE) ×9 IMPLANT
STAPLER SKIN PROX 35W (STAPLE) ×3 IMPLANT
STOCKINETTE IMPERV 14X48 (MISCELLANEOUS) IMPLANT
STRAP TIBIA SHORT (MISCELLANEOUS) ×3 IMPLANT
SUCTION FRAZIER HANDLE 10FR (MISCELLANEOUS) ×1
SUCTION TUBE FRAZIER 10FR DISP (MISCELLANEOUS) ×2 IMPLANT
SUT VIC AB 0 CT1 36 (SUTURE) ×6 IMPLANT
SUT VIC AB 1 CT1 36 (SUTURE) ×6 IMPLANT
SUT VIC AB 2-0 CT2 27 (SUTURE) ×3 IMPLANT
SYR 20ML LL LF (SYRINGE) ×3 IMPLANT
SYR 30ML LL (SYRINGE) ×6 IMPLANT
TIP FAN IRRIG PULSAVAC PLUS (DISPOSABLE) ×2 IMPLANT
TOWEL OR 17X26 4PK STRL BLUE (TOWEL DISPOSABLE) ×3 IMPLANT
TOWER CARTRIDGE SMART MIX (DISPOSABLE) ×3 IMPLANT
TRAY FOLEY MTR SLVR 16FR STAT (SET/KITS/TRAYS/PACK) ×3 IMPLANT
WATER STERILE IRR 500ML POUR (IV SOLUTION) ×3 IMPLANT
WRAPON POLAR PAD KNEE (MISCELLANEOUS) ×2

## 2021-04-08 NOTE — H&P (Signed)
The patient has been re-examined, and the chart reviewed, and there have been no interval changes to the documented history and physical.    The risks, benefits, and alternatives have been discussed at length. The patient expressed understanding of the risks benefits and agreed with plans for surgical intervention.  Blakely Gluth P. Kylan Liberati, Jr. M.D.    

## 2021-04-08 NOTE — Patient Instructions (Signed)
Examples of probiotics:  align, florastor or culturelle  Take a probiotic daily while you are on antibiotics and for two weeks after completing antibiotics.

## 2021-04-08 NOTE — Telephone Encounter (Signed)
Spoke with patient, Dr Marry Guan and Dr Nicki Reaper. Patient was seen at acute care and was given triamcinolone cream. Did not resolve issues with her leg. Surgery was canceled today to confirm no infection and Dr Marry Guan was concerned about doing surgery with swelling so close to where the incision would be. Dr Marry Guan was wanting Dr Nicki Reaper to look at her leg to determine possible cellulitis and if needs diuresis and abx treatment. Scheduled acute appt with Dr Nicki Reaper at 4:00 today.  Dr Nicki Reaper- Dr Marry Guan stated nothing else was needed unless you have questions for him. His callback number is listed below.

## 2021-04-08 NOTE — Telephone Encounter (Signed)
Dr Marry Guan aware pt being worked in today.

## 2021-04-08 NOTE — Progress Notes (Signed)
Patient ID: Nina Perez, female   DOB: 12-26-1940, 80 y.o.   MRN: [id number]   Subjective:    Patient ID: Nina Perez, female    DOB: 1940/12/31, 80 y.o.   MRN: [id number]  This visit occurred during the SARS-CoV-2 public health emergency.  Safety protocols were in place, including screening questions prior to the visit, additional usage of staff PPE, and extensive cleaning of exam room while observing appropriate contact time as indicated for disinfecting solutions.   Patient here for work in appt.  Chief Complaint  Patient presents with   Cellulitis   .   HPI Accompanied by her husband.  History obtained from both of them.  Was scheduled to have knee surgery today.  Received call from her surgeon Dr Marry Guan that surgery was being canceled due to increased redness of lower extremities.  Noticed starting 03/30/21 - increased redness and burning sensation in lower extremities.  Finished a 5 day course of keflex for UTI (on 04/01/21).  Was seen at [facility].  Was questioning rash to keflex and also diagnosed with stasis dermatitis.  Was prescribed triamcinolone cream.  Notes improvement in the rash, but still persistent - bilateral - lower extremities.  Surgery scheduled for today.  Concern over possible infection - asked to see.  Still with burning sensation. Reports not as red and covers smaller area.  No increased sob.  No chest pain.  Discussed leg elevation.     Past Medical History:  Diagnosis Date   Aortic atherosclerosis (HCC)    Barrett's esophagus    Cardiac murmur    CKD (chronic kidney disease), stage III (HCC)    Dyspnea on exertion    GERD (gastroesophageal reflux disease)    Gout    History of 2019 novel coronavirus disease (COVID-19) 08/2020   History of colon cancer    adenomatous polyps   Hypercholesterolemia    Hypertension    ILD (interstitial lung disease) ([place])    a.) 2/2 scleroderma Dx   Inflammatory arthritis    LBBB (left bundle branch block)    Long term current use  of antithrombotics/antiplatelets    a.) ASA + clopidogrel   Long term current use of immunosuppressive drug    a.) MTX for RA and scleroderma Dx   OSA on CPAP    Osteoarthritis    knees, spine   PAD (peripheral artery disease) (HCC)    Phlebitis    x2 (with pregnancy)   Pulmonary fibrosis (HCC)    mild   Pulmonary hypertension (HCC)    Reflux esophagitis    Rheumatoid arthritis(714.0)    positive RF, FANA, RNP, negative CCP ab and anti DNA,.  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, sclerodactyly, telangiectasias   Skin cancer 2018   Supplemental oxygen dependent    a.) 2L/[place] PRN for DOE related to mild pulmonary fibrosis and pulmonary HTN   T2DM (type 2 diabetes mellitus) ([place])    Past Surgical History:  Procedure Laterality Date   ABDOMINAL AORTOGRAM W/LOWER EXTREMITY Left 05/16/2019   Procedure: ABDOMINAL AORTOGRAM W/LOWER EXTREMITY;  Surgeon: Lorretta Harp, MD;  Location: [facility];  Service: Cardiovascular;  Laterality: Left;   AMPUTATION TOE Left 06/02/2019   Procedure: AMPUTATION TOE;  Surgeon: Albertine Patricia, DPM;  Location: [facility];  Service: Podiatry;  Laterality: Left;   APPENDECTOMY  1972   BREAST CYST ASPIRATION Left    neg   INCISION AND  DRAINAGE OF WOUND Left 06/07/2019   Procedure: IRRIGATION AND DEBRIDEMENT WOUND;  Surgeon: Albertine Patricia, DPM;  Location: [facility];  Service: Podiatry;  Laterality: Left;   LOWER EXTREMITY ANGIOGRAPHY Left 06/03/2019   Procedure: Lower Extremity Angiography;  Surgeon: Katha Cabal, MD;  Location: [place];  Service: Cardiovascular;  Laterality: Left;   PERIPHERAL VASCULAR BALLOON ANGIOPLASTY Left 05/16/2019   Procedure: PERIPHERAL VASCULAR BALLOON ANGIOPLASTY;  Surgeon: Lorretta Harp, MD;  Location: [facility];  Service: Cardiovascular;  Laterality: Left;  SFA   TUBAL LIGATION  1972   VEIN LIGATION AND STRIPPING     Family History  Problem  Relation Age of Onset   Arthritis Mother    Heart attack Father    Colon cancer Neg Hx    Breast cancer Neg Hx    Social History   Socioeconomic History   Marital status: Married    Spouse name: Not on file   Number of children: Not on file   Years of education: Not on file   Highest education level: Not on file  Occupational History   Not on file  Tobacco Use   Smoking status: Former    Packs/day: 0.50    Years: 15.00    Pack years: 7.50    Types: Cigarettes    Quit date: 05/12/1988    Years since quitting: 32.9   Smokeless tobacco: Never  Vaping Use   Vaping Use: Never used  Substance and Sexual Activity   Alcohol use: No    Alcohol/week: 0.0 standard drinks   Drug use: No   Sexual activity: Yes  Other Topics Concern   Not on file  Social History Narrative   Not on file   Social Determinants of Health   Financial Resource Strain: Low Risk    Difficulty of Paying Living Expenses: Not hard at all  Food Insecurity: No Food Insecurity   Worried About Charity fundraiser in the Last Year: Never true   [place] in the Last Year: Never true  Transportation Needs: No Transportation Needs   Lack of Transportation (Medical): No   Lack of Transportation (Non-Medical): No  Physical Activity: Not on file  Stress: No Stress Concern Present   Feeling of Stress : Not at all  Social Connections: Socially Integrated   Frequency of Communication with Friends and Family: More than three times a week   Frequency of Social Gatherings with Friends and Family: Not on file   Attends Religious Services: More than 4 times per year   Active Member of [employer] or Organizations: Yes   Attends Music therapist: More than 4 times per year   Marital Status: Married     Review of Systems  Constitutional:  Negative for appetite change and unexpected weight change.  HENT:  Negative for congestion and sinus pressure.   Respiratory:  Negative for cough and chest tightness.         No increased sob.  Breathing stable.   Cardiovascular:  Negative for chest pain and palpitations.       Swelling of lower extremities - stable.   Gastrointestinal:  Negative for abdominal pain, diarrhea, nausea and vomiting.  Genitourinary:  Negative for difficulty urinating and dysuria.  Skin:  Positive for rash.       Redness - lower extremities as outlined.   Neurological:  Negative for dizziness, light-headedness and headaches.  Psychiatric/Behavioral:  Negative for agitation and dysphoric mood.  Objective:     BP 136/72   Pulse 68   Temp 97.8 F (36.6 C)   Resp 16   Wt 223 lb (101.2 kg)   LMP 05/25/1988   SpO2 97%   BMI 35.99 kg/m  Wt Readings from Last 3 Encounters:  04/08/21 223 lb 1.7 oz (101.2 kg)  04/08/21 223 lb (101.2 kg)  04/02/21 223 lb (101.2 kg)    Physical Exam Vitals reviewed.  Constitutional:      General: She is not in acute distress.    Appearance: Normal appearance.  HENT:     Head: Normocephalic and atraumatic.  Eyes:     General: No scleral icterus.       Right eye: No discharge.        Left eye: No discharge.     Conjunctiva/sclera: Conjunctivae normal.  Neck:     Thyroid: No thyromegaly.  Cardiovascular:     Rate and Rhythm: Normal rate and regular rhythm.  Pulmonary:     Effort: No respiratory distress.     Breath sounds: Normal breath sounds. No wheezing.  Abdominal:     General: Bowel sounds are normal.     Palpations: Abdomen is soft.     Tenderness: There is no abdominal tenderness.  Musculoskeletal:     Cervical back: Neck supple. No tenderness.     Comments: Swelling of lower extremities - stable.  Erythematous rash - bilateral lower extremities - up to mid lower legs.    Lymphadenopathy:     Cervical: No cervical adenopathy.  Skin:    Findings: No erythema or rash.  Neurological:     Mental Status: She is alert.  Psychiatric:        Mood and Affect: Mood normal.        Behavior: Behavior normal.      Facility-Administered Encounter Medications as of 04/08/2021  Medication   0.9 %  sodium chloride infusion   [EXPIRED] ceFAZolin (ANCEF) IVPB 2g/100 mL premix   [EXPIRED] celecoxib (CELEBREX) 200 MG capsule   celecoxib (CELEBREX) capsule 400 mg   chlorhexidine (HIBICLENS) 4 % liquid 4 application   chlorhexidine (PERIDEX) 0.12 % solution 15 mL   Or   MEDLINE mouth rinse   dexamethasone (DECADRON) injection 8 mg   [EXPIRED] gabapentin (NEURONTIN) 300 MG capsule   gabapentin (NEURONTIN) capsule 300 mg   [EXPIRED] tranexamic acid (CYKLOKAPRON) IVPB 1,000 mg   Outpatient Encounter Medications as of 04/08/2021  Medication Sig   doxycycline (VIBRA-TABS) 100 MG tablet Take 1 tablet (100 mg total) by mouth 2 (two) times daily.   acetaminophen (TYLENOL) 650 MG CR tablet Take 650-1,300 mg by mouth every 8 (eight) hours as needed for pain.   allopurinol (ZYLOPRIM) 300 MG tablet Take 1 tablet by mouth once daily   amLODipine (NORVASC) 10 MG tablet TAKE 1 TABLET BY MOUTH DAILY   aspirin 81 MG tablet Take 81 mg by mouth daily. Reported on 07/24/2015   atorvastatin (LIPITOR) 40 MG tablet Take 1 tablet by mouth once daily   clopidogrel (PLAVIX) 75 MG tablet Take 1 tablet by mouth once daily with breakfast   fexofenadine (ALLEGRA) 180 MG tablet Take 1 tablet (180 mg total) by mouth daily. (Patient taking differently: Take 180 mg by mouth daily as needed for allergies.)   folic acid (FOLVITE) 1 MG tablet Take 1 mg by mouth every evening.    glucose blood (BAYER CONTOUR TEST) test strip USE  STRIP TO CHECK GLUCOSE TWICE DAILY. Dx: E11.9  ipratropium (ATROVENT) 0.06 % nasal spray Place 2 sprays into both nostrils 4 (four) times daily. (Patient taking differently: Place 2 sprays into both nostrils 4 (four) times daily as needed for rhinitis.)   losartan (COZAAR) 100 MG tablet Take 1 tablet by mouth once daily   metFORMIN (GLUCOPHAGE) 500 MG tablet Take 1 tablet by mouth once daily   methotrexate  (RHEUMATREX) 2.5 MG tablet Take 10 mg by mouth every Wednesday.   metoprolol succinate (TOPROL-XL) 50 MG 24 hr tablet TAKE 1 TABLET BY MOUTH ONCE DAILY IMMEDIATELY FOLLOWING A MEAL   MICROLET LANCETS MISC Check sugar once daily, [employer] Microlet Lancets. Dx E11.9   pantoprazole (PROTONIX) 40 MG tablet Take 1 tablet by mouth once daily   triamcinolone cream (KENALOG) 0.1 % Apply 1 application topically 2 (two) times daily. To lower legs before putting on and/or after removing compression hose for redness in legs   trospium (SANCTURA) 20 MG tablet Take 1 tablet (20 mg total) by mouth at bedtime.     Lab Results  Component Value Date   WBC 6.0 03/25/2021   HGB 12.9 03/25/2021   HCT 40.7 03/25/2021   PLT 186 03/25/2021   GLUCOSE 117 (H) 03/25/2021   CHOL 88 01/11/2021   TRIG 99.0 01/11/2021   HDL 35.70 (L) 01/11/2021   LDLCALC 33 01/11/2021   ALT 17 03/25/2021   AST 23 03/25/2021   NA 139 03/25/2021   K 3.7 03/25/2021   CL 106 03/25/2021   CREATININE 1.11 (H) 03/25/2021   BUN 29 (H) 03/25/2021   CO2 26 03/25/2021   TSH 4.33 07/03/2020   INR 1.1 03/25/2021   HGBA1C 5.9 (H) 03/25/2021   MICROALBUR 2.7 (H) 09/28/2020       Assessment & Plan:   Problem List Items Addressed This Visit     Diabetes mellitus ([place])    On metformin.  Low carb diet and exercise.  Follow met b and a1c.       Hypertension    Continue metoprolol, losartan and amlodipine.  Follow pressures.  Follow metabolic panel.       PAD (peripheral artery disease) (HCC)    S/p angiogram, angioplasty and toe amputation.  Continue lipitor and plavix.  Will have vascular surgery evaluate for further recommendations prior to surgery.       Redness of skin    Erythema of lower extremities as outlined.  Appears to be c/w stasis dermatitis.  Has improved overall (per pt and her husband).  Not as red and does not extend as far.  Will continue triamcinolone cream.  Surgery concerned regarding possible cellulitis.  Will  cover with doxycycline for possible overlying infection.  Discussed need for leg elevation.  Given vascular history, will schedule f/u with vascular surgery prior to procedure - for recommendations and clearance.  Pt in agreement.        Rheumatoid arthritis (HCC)    On MTX.  Followed by Dr Jefm Bryant.       Swelling of lower extremity    Appear to be stable overall.  Discussed need for leg elevation.  Vascular surgery to evaluate as outlined.          Einar Pheasant, MD

## 2021-04-08 NOTE — Telephone Encounter (Signed)
Dr. Duayne Cal from [facility] called in regards to pts surgery. Pt was scheduled for surgery today and it was canceled due to lower extremity edema that is different from her baseline and because pt is experiencing cellulitis. Dr. Marry Guan wants to know if pt can be seen in office or should he get her set up with acute care? Pt was seen in acute care [facility] walk-in clinic) where she was given triamcinolone cream that didn't work for her. Dr. Marry Guan believes pt needs antibiotics.      Dr. Duayne Cal: [phone] (cell)

## 2021-04-08 NOTE — Progress Notes (Signed)
As Dr. Marry Guan is coming into the room patient states, "I did have a change with my legs".  Pulled up legs on sweat pants to showlower extremities from just below the knees to just above the ankles are swollen and red.  States she had been to urgent care and they gave her a cream to apply.  Legs had been hot to touch with pain.  States that has improved.  Surgeon and patient discussing reschedule of surgery and best plan for treatment.

## 2021-04-09 ENCOUNTER — Encounter: Payer: Self-pay | Admitting: Internal Medicine

## 2021-04-09 ENCOUNTER — Telehealth: Payer: Self-pay | Admitting: Internal Medicine

## 2021-04-09 DIAGNOSIS — L539 Erythematous condition, unspecified: Secondary | ICD-10-CM | POA: Insufficient documentation

## 2021-04-09 NOTE — Assessment & Plan Note (Signed)
On metformin.  Low carb diet and exercise.  Follow met b and a1c.   

## 2021-04-09 NOTE — Telephone Encounter (Signed)
LM for [facility] to call back.

## 2021-04-09 NOTE — Assessment & Plan Note (Signed)
Erythema of lower extremities as outlined.  Appears to be c/w stasis dermatitis.  Has improved overall (per pt and her husband).  Not as red and does not extend as far.  Will continue triamcinolone cream.  Surgery concerned regarding possible cellulitis.  Will cover with doxycycline for possible overlying infection.  Discussed need for leg elevation.  Given vascular history, will schedule f/u with vascular surgery prior to procedure - for recommendations and clearance.  Pt in agreement.

## 2021-04-09 NOTE — Assessment & Plan Note (Signed)
Continue metoprolol, losartan and amlodipine.  Follow pressures.  Follow metabolic panel.  

## 2021-04-09 NOTE — Assessment & Plan Note (Signed)
S/p angiogram, angioplasty and toe amputation.  Continue lipitor and plavix.  Will have vascular surgery evaluate for further recommendations prior to surgery.

## 2021-04-09 NOTE — Assessment & Plan Note (Signed)
On MTX.  Followed by Dr Jefm Bryant.

## 2021-04-09 NOTE — Telephone Encounter (Signed)
Please call vascular surgery.  She has seen Dr Delana Meyer and Eulogio Ditch previously.  Is planning for knee surgery.  I am treating her for cellulitis and stasis dermatitis.  Ortho and I would like her evaluated prior to her knee surgery - given her history of peripheral artery disease and persistent lower extremity swelling.

## 2021-04-09 NOTE — Assessment & Plan Note (Signed)
Appear to be stable overall.  Discussed need for leg elevation.  Vascular surgery to evaluate as outlined.

## 2021-04-12 NOTE — Telephone Encounter (Signed)
Pt calling in regards to previous message. Pls call pt on home phone [phone]

## 2021-04-12 NOTE — Telephone Encounter (Signed)
Patient appointment is 05/16/21 @ 2 pm patient aware.

## 2021-04-12 NOTE — Telephone Encounter (Signed)
Reviewed.  Please confirm how her leg is doing.  Has rash/redness resolved?

## 2021-04-15 NOTE — Telephone Encounter (Signed)
Called patient. She finished antibiotic today. She has had noticed improvement in rash and redness. It is not completely gone but better. Wanted to know if she needs to make follow up with you.

## 2021-04-16 ENCOUNTER — Other Ambulatory Visit: Payer: Self-pay | Admitting: Internal Medicine

## 2021-04-16 NOTE — Telephone Encounter (Signed)
Scheduled, Patient was AV&Vs for Feb the earliest that nurse could get.

## 2021-04-16 NOTE — Telephone Encounter (Signed)
I can see her on 04/18/21 at 11:30.

## 2021-04-18 ENCOUNTER — Encounter: Payer: Self-pay | Admitting: Internal Medicine

## 2021-04-18 ENCOUNTER — Other Ambulatory Visit: Payer: Self-pay

## 2021-04-18 ENCOUNTER — Ambulatory Visit (INDEPENDENT_AMBULATORY_CARE_PROVIDER_SITE_OTHER): Payer: Medicare Other | Admitting: Internal Medicine

## 2021-04-18 DIAGNOSIS — N1831 Chronic kidney disease, stage 3a: Secondary | ICD-10-CM | POA: Diagnosis not present

## 2021-04-18 DIAGNOSIS — I739 Peripheral vascular disease, unspecified: Secondary | ICD-10-CM | POA: Diagnosis not present

## 2021-04-18 DIAGNOSIS — I10 Essential (primary) hypertension: Secondary | ICD-10-CM | POA: Diagnosis not present

## 2021-04-18 DIAGNOSIS — E1159 Type 2 diabetes mellitus with other circulatory complications: Secondary | ICD-10-CM | POA: Diagnosis not present

## 2021-04-18 DIAGNOSIS — L539 Erythematous condition, unspecified: Secondary | ICD-10-CM

## 2021-04-18 DIAGNOSIS — I70249 Atherosclerosis of native arteries of left leg with ulceration of unspecified site: Secondary | ICD-10-CM | POA: Diagnosis not present

## 2021-04-18 DIAGNOSIS — M7989 Other specified soft tissue disorders: Secondary | ICD-10-CM

## 2021-04-18 NOTE — Progress Notes (Signed)
Patient ID: Nina Perez, female   DOB: August 20, 1940, 80 y.o.   MRN: [id number]   Subjective:    Patient ID: Nina Perez, female    DOB: 1941-05-12, 80 y.o.   MRN: [id number]  This visit occurred during the SARS-CoV-2 public health emergency.  Safety protocols were in place, including screening questions prior to the visit, additional usage of staff PPE, and extensive cleaning of exam room while observing appropriate contact time as indicated for disinfecting solutions.   Patient here for work in appt.   Chief Complaint  Patient presents with   Follow-up    On rash on legs   .   HPI Was recently scheduled to have knee surgery.  Given her lower extremity redness, swelling and presence of a rash, surgery postponed.  I saw her 04/08/21.  Concern over possible cellulitis.  Was placed on doycycline.  Comes in for reevaluation of legs today.  Swelling has improved some.  Decreased rash.  Decreased redness.  No fever.  Eating.  Persistent issues with her knee and desires surgery when able.  Breathing stable.     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, sclerodactyly, telangiectasias   Skin cancer 2018   Supplemental oxygen dependent    a.) 2L/ [place] PRN for DOE related to mild pulmonary fibrosis and pulmonary HTN   T2DM (type 2 diabetes mellitus) ([place])    Past Surgical History:  Procedure Laterality Date   ABDOMINAL AORTOGRAM W/LOWER EXTREMITY Left 05/16/2019   Procedure: ABDOMINAL AORTOGRAM W/LOWER EXTREMITY;  Surgeon: Lorretta Harp, MD;  Location: [place] [facility];  Service: Cardiovascular;  Laterality: Left;   AMPUTATION TOE Left 06/02/2019   Procedure: AMPUTATION TOE;  Surgeon: Albertine Patricia, DPM;  Location: [facility];  Service: Podiatry;  Laterality: Left;   APPENDECTOMY  1972   BREAST CYST ASPIRATION Left    neg   INCISION AND DRAINAGE OF WOUND Left 06/07/2019   Procedure: IRRIGATION AND DEBRIDEMENT WOUND;  Surgeon: Albertine Patricia, DPM;  Location: [facility];  Service: Podiatry;  Laterality: Left;   LOWER EXTREMITY ANGIOGRAPHY Left 06/03/2019   Procedure: Lower Extremity Angiography;  Surgeon: Katha Cabal, MD;  Location: [place] [facility];  Service: Cardiovascular;  Laterality:  Left;   PERIPHERAL VASCULAR BALLOON ANGIOPLASTY Left 05/16/2019   Procedure: PERIPHERAL VASCULAR BALLOON ANGIOPLASTY;  Surgeon: Lorretta Harp, MD;  Location: [place] [facility];  Service: Cardiovascular;  Laterality: Left;  SFA   TUBAL LIGATION  1972   VEIN LIGATION AND STRIPPING     Family History  Problem Relation Age of Onset   Arthritis Mother    Heart attack Father    Colon cancer Neg Hx    Breast cancer Neg Hx    Social History   Socioeconomic History   Marital status: Married    Spouse name: Not on file   Number of children: Not on file   Years of education: Not on file   Highest education level: Not on file  Occupational History   Not  on file  Tobacco Use   Smoking status: Former    Packs/day: 0.50    Years: 15.00    Pack years: 7.50    Types: Cigarettes    Quit date: 05/12/1988    Years since quitting: 32.9   Smokeless tobacco: Never  Vaping Use   Vaping Use: Never used  Substance and Sexual Activity   Alcohol use: No    Alcohol/week: 0.0 standard drinks   Drug use: No   Sexual activity: Yes  Other Topics Concern   Not on file  Social History Narrative   Not on file   Social Determinants of Health   Financial Resource Strain: Low Risk    Difficulty of Paying Living Expenses: Not hard at all  Food Insecurity: No Food Insecurity   Worried About Charity fundraiser in the Last Year: Never true   [place] in the Last Year: Never true  Transportation Needs: No Transportation Needs   Lack of Transportation (Medical): No   Lack of Transportation (Non-Medical): No  Physical Activity: Not on file  Stress: No Stress Concern Present   Feeling of Stress : Not at all  Social Connections: Socially Integrated   Frequency of Communication with Friends and Family: More than three times a week   Frequency of Social Gatherings with Friends and Family: Not on file   Attends Religious Services: More than 4 times per year   Active Member of [employer] or Organizations: Yes   Attends Music therapist: More than 4 times per year   Marital Status: Married     Review of Systems  Constitutional:  Negative for appetite change and unexpected weight change.  HENT:  Negative for congestion and sinus pressure.   Respiratory:  Negative for cough, chest tightness and shortness of breath.   Cardiovascular:  Negative for chest pain and palpitations.       Leg swelling appear to have improved.   Gastrointestinal:  Negative for abdominal pain, diarrhea, nausea and vomiting.  Genitourinary:  Negative for difficulty urinating and dysuria.  Musculoskeletal:  Negative for myalgias.       Increased knee pain.   Skin:   Negative for color change and rash.  Neurological:  Negative for dizziness, light-headedness and headaches.  Psychiatric/Behavioral:  Negative for agitation and dysphoric mood.       Objective:     BP 110/70   Pulse 72   Temp 98.3 F (36.8 C) (Oral)   Ht _0  (1.676 m)   Wt 226 lb 9.6 oz (102.8 kg)   LMP 05/25/1988   SpO2 95%   BMI 36.57 kg/m  Wt Readings from Last 3 Encounters:  04/18/21 226  lb 9.6 oz (102.8 kg)  04/08/21 223 lb (101.2 kg)  04/08/21 223 lb 1.7 oz (101.2 kg)    Physical Exam Vitals reviewed.  Constitutional:      General: She is not in acute distress.    Appearance: Normal appearance.  HENT:     Head: Normocephalic and atraumatic.     Right Ear: External ear normal.     Left Ear: External ear normal.  Eyes:     General: No scleral icterus.       Right eye: No discharge.        Left eye: No discharge.     Conjunctiva/sclera: Conjunctivae normal.  Neck:     Thyroid: No thyromegaly.  Cardiovascular:     Rate and Rhythm: Normal rate and regular rhythm.  Pulmonary:     Effort: No respiratory distress.     Breath sounds: Normal breath sounds. No wheezing.  Abdominal:     General: Bowel sounds are normal.     Palpations: Abdomen is soft.     Tenderness: There is no abdominal tenderness.  Musculoskeletal:     Cervical back: Neck supple. No tenderness.     Comments: Some erythema - improved.  Appears to be related to stasis changes.  No evidence of acute infection.  Swelling improved.    Lymphadenopathy:     Cervical: No cervical adenopathy.  Skin:    Findings: No erythema or rash.  Neurological:     Mental Status: She is alert.  Psychiatric:        Mood and Affect: Mood normal.        Behavior: Behavior normal.     Outpatient Encounter Medications as of 04/18/2021  Medication Sig   acetaminophen (TYLENOL) 650 MG CR tablet Take 650-1,300 mg by mouth every 8 (eight) hours as needed for pain.   allopurinol (ZYLOPRIM) 300 MG tablet Take 1 tablet  by mouth once daily   amLODipine (NORVASC) 10 MG tablet TAKE 1 TABLET BY MOUTH DAILY   aspirin 81 MG tablet Take 81 mg by mouth daily. Reported on 07/24/2015   atorvastatin (LIPITOR) 40 MG tablet Take 1 tablet by mouth once daily   clopidogrel (PLAVIX) 75 MG tablet Take 1 tablet by mouth once daily with breakfast   fexofenadine (ALLEGRA) 180 MG tablet Take 1 tablet (180 mg total) by mouth daily. (Patient taking differently: Take 180 mg by mouth daily as needed for allergies.)   folic acid (FOLVITE) 1 MG tablet Take 1 mg by mouth every evening.    glucose blood (BAYER CONTOUR TEST) test strip USE  STRIP TO CHECK GLUCOSE TWICE DAILY. Dx: E11.9   ipratropium (ATROVENT) 0.06 % nasal spray Place 2 sprays into both nostrils 4 (four) times daily. (Patient taking differently: Place 2 sprays into both nostrils 4 (four) times daily as needed for rhinitis.)   losartan (COZAAR) 100 MG tablet Take 1 tablet by mouth once daily   metFORMIN (GLUCOPHAGE) 500 MG tablet Take 1 tablet by mouth once daily   methotrexate (RHEUMATREX) 2.5 MG tablet Take 10 mg by mouth every Wednesday.   metoprolol succinate (TOPROL-XL) 50 MG 24 hr tablet TAKE 1 TABLET BY MOUTH ONCE DAILY IMMEDIATELY FOLLOWING A MEAL   MICROLET LANCETS MISC Check sugar once daily, [employer] Microlet Lancets. Dx E11.9   pantoprazole (PROTONIX) 40 MG tablet Take 1 tablet by mouth once daily   triamcinolone cream (KENALOG) 0.1 % Apply 1 application topically 2 (two) times daily. To lower legs before putting on and/or after  removing compression hose for redness in legs   trospium (SANCTURA) 20 MG tablet Take 1 tablet (20 mg total) by mouth at bedtime.   [DISCONTINUED] doxycycline (VIBRA-TABS) 100 MG tablet Take 1 tablet (100 mg total) by mouth 2 (two) times daily. (Patient not taking: Reported on 04/18/2021)   No facility-administered encounter medications on file as of 04/18/2021.     Lab Results  Component Value Date   WBC 6.0 03/25/2021   HGB 12.9  03/25/2021   HCT 40.7 03/25/2021   PLT 186 03/25/2021   GLUCOSE 117 (H) 03/25/2021   CHOL 88 01/11/2021   TRIG 99.0 01/11/2021   HDL 35.70 (L) 01/11/2021   LDLCALC 33 01/11/2021   ALT 17 03/25/2021   AST 23 03/25/2021   NA 139 03/25/2021   K 3.7 03/25/2021   CL 106 03/25/2021   CREATININE 1.11 (H) 03/25/2021   BUN 29 (H) 03/25/2021   CO2 26 03/25/2021   TSH 4.33 07/03/2020   INR 1.1 03/25/2021   HGBA1C 5.9 (H) 03/25/2021   MICROALBUR 2.7 (H) 09/28/2020    No results found.     Assessment & Plan:   Problem List Items Addressed This Visit     CKD (chronic kidney disease) stage 3, GFR 30-59 ml/min (HCC)    GFR last checked 47.  Stable. Avoid antiinflammatories.  Stay hydrated. Follow metabolic panel.       Diabetes mellitus ([place])    On metformin.  Low carb diet and exercise.  Follow met b and a1c.       Hypertension    Continue metoprolol, losartan and amlodipine.  Follow pressures.  Follow metabolic panel.       PAD (peripheral artery disease) (HCC)    S/p angiogram, angioplasty and toe amputation.  Continue lipitor and plavix.  Will have vascular surgery evaluate for further recommendations prior to surgery. appt scheduled.  See if can get earlier appt.       Redness of skin    Redness now appears to be c/w stasis changes.  No evidence of acute infection.  Improved.  Continue compression hose and leg elevation.  Will have vascular surgery evaluate for recommendations prior to surgery.        Swelling of lower extremity    Appears improved.  F/u with vascular surgery as outlined.          Einar Pheasant, MD

## 2021-04-19 ENCOUNTER — Encounter: Payer: Self-pay | Admitting: Internal Medicine

## 2021-04-19 NOTE — Assessment & Plan Note (Signed)
On metformin.  Low carb diet and exercise.  Follow met b and a1c.   

## 2021-04-19 NOTE — Assessment & Plan Note (Addendum)
S/p angiogram, angioplasty and toe amputation.  Continue lipitor and plavix.  Will have vascular surgery evaluate for further recommendations prior to surgery. appt scheduled.  See if can get earlier appt.

## 2021-04-19 NOTE — Assessment & Plan Note (Signed)
Continue metoprolol, losartan and amlodipine.  Follow pressures.  Follow metabolic panel.  

## 2021-04-19 NOTE — Assessment & Plan Note (Signed)
Appears improved.  F/u with vascular surgery as outlined.

## 2021-04-19 NOTE — Assessment & Plan Note (Signed)
Redness now appears to be c/w stasis changes.  No evidence of acute infection.  Improved.  Continue compression hose and leg elevation.  Will have vascular surgery evaluate for recommendations prior to surgery.

## 2021-04-19 NOTE — Assessment & Plan Note (Signed)
GFR last checked 47.  Stable. Avoid antiinflammatories.  Stay hydrated. Follow metabolic panel.

## 2021-05-07 ENCOUNTER — Other Ambulatory Visit (INDEPENDENT_AMBULATORY_CARE_PROVIDER_SITE_OTHER): Payer: Self-pay | Admitting: Nurse Practitioner

## 2021-05-07 DIAGNOSIS — I739 Peripheral vascular disease, unspecified: Secondary | ICD-10-CM

## 2021-05-08 ENCOUNTER — Ambulatory Visit (INDEPENDENT_AMBULATORY_CARE_PROVIDER_SITE_OTHER): Payer: Medicare Other | Admitting: Nurse Practitioner

## 2021-05-08 ENCOUNTER — Encounter (INDEPENDENT_AMBULATORY_CARE_PROVIDER_SITE_OTHER): Payer: Self-pay | Admitting: Nurse Practitioner

## 2021-05-08 ENCOUNTER — Ambulatory Visit (INDEPENDENT_AMBULATORY_CARE_PROVIDER_SITE_OTHER): Payer: Medicare Other

## 2021-05-08 ENCOUNTER — Other Ambulatory Visit: Payer: Self-pay

## 2021-05-08 VITALS — BP 142/82 | HR 76 | Ht 66.0 in | Wt 227.0 lb

## 2021-05-08 DIAGNOSIS — Z9889 Other specified postprocedural states: Secondary | ICD-10-CM

## 2021-05-08 DIAGNOSIS — I10 Essential (primary) hypertension: Secondary | ICD-10-CM | POA: Diagnosis not present

## 2021-05-08 DIAGNOSIS — I739 Peripheral vascular disease, unspecified: Secondary | ICD-10-CM

## 2021-05-08 DIAGNOSIS — E7800 Pure hypercholesterolemia, unspecified: Secondary | ICD-10-CM | POA: Diagnosis not present

## 2021-05-15 ENCOUNTER — Other Ambulatory Visit: Payer: Self-pay | Admitting: Internal Medicine

## 2021-05-15 ENCOUNTER — Other Ambulatory Visit (INDEPENDENT_AMBULATORY_CARE_PROVIDER_SITE_OTHER): Payer: Medicare Other

## 2021-05-15 ENCOUNTER — Other Ambulatory Visit: Payer: Self-pay

## 2021-05-15 DIAGNOSIS — E1159 Type 2 diabetes mellitus with other circulatory complications: Secondary | ICD-10-CM

## 2021-05-15 DIAGNOSIS — E7800 Pure hypercholesterolemia, unspecified: Secondary | ICD-10-CM

## 2021-05-15 LAB — BASIC METABOLIC PANEL
BUN: 23 mg/dL (ref 6–23)
CO2: 26 mEq/L (ref 19–32)
Calcium: 9.8 mg/dL (ref 8.4–10.5)
Chloride: 105 mEq/L (ref 96–112)
Creatinine, Ser: 0.97 mg/dL (ref 0.40–1.20)
GFR: 55.39 mL/min — ABNORMAL LOW (ref >60.00)
Glucose, Bld: 95 mg/dL (ref 70–99)
Potassium: 4.1 mEq/L (ref 3.5–5.1)
Sodium: 139 mEq/L (ref 135–145)

## 2021-05-15 LAB — HEPATIC FUNCTION PANEL
ALT: 14 U/L (ref 0–35)
AST: 19 U/L (ref 0–37)
Albumin: 3.8 g/dL (ref 3.5–5.2)
Alkaline Phosphatase: 70 U/L (ref 39–117)
Bilirubin, Direct: 0.1 mg/dL (ref 0.0–0.3)
Total Bilirubin: 0.7 mg/dL (ref 0.2–1.2)
Total Protein: 7.1 g/dL (ref 6.0–8.3)

## 2021-05-15 LAB — LIPID PANEL
Cholesterol: 100 mg/dL (ref 0–200)
HDL: 34.3 mg/dL — ABNORMAL LOW (ref >39.00)
LDL Cholesterol: 42 mg/dL (ref 0–99)
NonHDL: 65.96
Total CHOL/HDL Ratio: 3
Triglycerides: 120 mg/dL (ref 0.0–149.0)
VLDL: 24 mg/dL (ref 0.0–40.0)

## 2021-05-15 LAB — HEMOGLOBIN A1C: Hgb A1c MFr Bld: 6 % (ref 4.6–6.5)

## 2021-05-16 ENCOUNTER — Ambulatory Visit (INDEPENDENT_AMBULATORY_CARE_PROVIDER_SITE_OTHER): Payer: Medicare Other | Admitting: Vascular Surgery

## 2021-05-16 ENCOUNTER — Encounter (INDEPENDENT_AMBULATORY_CARE_PROVIDER_SITE_OTHER): Payer: Medicare Other

## 2021-05-16 ENCOUNTER — Telehealth (INDEPENDENT_AMBULATORY_CARE_PROVIDER_SITE_OTHER): Payer: Self-pay

## 2021-05-16 NOTE — Telephone Encounter (Signed)
Spoke with the patient and she is scheduled with Dr. Delana Meyer for a RLE angio on 05/21/21 with a 8:00 am arrival time to the [facility]. Pre-procedure instructions were discussed and will be mailed.

## 2021-05-17 ENCOUNTER — Other Ambulatory Visit: Payer: Self-pay

## 2021-05-17 ENCOUNTER — Ambulatory Visit (INDEPENDENT_AMBULATORY_CARE_PROVIDER_SITE_OTHER): Payer: Medicare Other | Admitting: Internal Medicine

## 2021-05-17 ENCOUNTER — Encounter: Payer: Self-pay | Admitting: Internal Medicine

## 2021-05-17 VITALS — BP 122/70 | HR 70 | Temp 98.0°F | Resp 16 | Ht 66.0 in | Wt 225.8 lb

## 2021-05-17 DIAGNOSIS — I10 Essential (primary) hypertension: Secondary | ICD-10-CM

## 2021-05-17 DIAGNOSIS — K2100 Gastro-esophageal reflux disease with esophagitis, without bleeding: Secondary | ICD-10-CM

## 2021-05-17 DIAGNOSIS — M341 CR(E)ST syndrome: Secondary | ICD-10-CM

## 2021-05-17 DIAGNOSIS — I700 Atherosclerosis of aorta: Secondary | ICD-10-CM

## 2021-05-17 DIAGNOSIS — E7800 Pure hypercholesterolemia, unspecified: Secondary | ICD-10-CM

## 2021-05-17 DIAGNOSIS — Z89422 Acquired absence of other left toe(s): Secondary | ICD-10-CM

## 2021-05-17 DIAGNOSIS — E1159 Type 2 diabetes mellitus with other circulatory complications: Secondary | ICD-10-CM | POA: Diagnosis not present

## 2021-05-17 DIAGNOSIS — I70229 Atherosclerosis of native arteries of extremities with rest pain, unspecified extremity: Secondary | ICD-10-CM

## 2021-05-17 DIAGNOSIS — D1771 Benign lipomatous neoplasm of kidney: Secondary | ICD-10-CM

## 2021-05-17 DIAGNOSIS — D696 Thrombocytopenia, unspecified: Secondary | ICD-10-CM

## 2021-05-17 DIAGNOSIS — I872 Venous insufficiency (chronic) (peripheral): Secondary | ICD-10-CM

## 2021-05-17 DIAGNOSIS — N1831 Chronic kidney disease, stage 3a: Secondary | ICD-10-CM

## 2021-05-17 DIAGNOSIS — M7989 Other specified soft tissue disorders: Secondary | ICD-10-CM

## 2021-05-17 DIAGNOSIS — I739 Peripheral vascular disease, unspecified: Secondary | ICD-10-CM

## 2021-05-17 DIAGNOSIS — M069 Rheumatoid arthritis, unspecified: Secondary | ICD-10-CM

## 2021-05-17 DIAGNOSIS — K2270 Barrett's esophagus without dysplasia: Secondary | ICD-10-CM

## 2021-05-17 DIAGNOSIS — I272 Pulmonary hypertension, unspecified: Secondary | ICD-10-CM

## 2021-05-17 NOTE — Progress Notes (Signed)
Patient ID: Nina Perez, female   DOB: 06-Dec-1940, 81 y.o.   MRN: [id number]   Subjective:    Patient ID: Nina Perez, female    DOB: 04/27/1941, 81 y.o.   MRN: [id number]  This visit occurred during the SARS-CoV-2 public health emergency.  Safety protocols were in place, including screening questions prior to the visit, additional usage of staff PPE, and extensive cleaning of exam room while observing appropriate contact time as indicated for disinfecting solutions.   Patient here for a scheduled follow up.   Chief Complaint  Patient presents with   Diabetes   Hyperlipidemia   Hypertension   .   HPI Was recently scheduled to have knee surgery.  Was having increased lower extremity swelling, redness and rash.  Surgery cancelled. Was treated for cellulitis and referred to vascular surgery for further evaluation.  Redness is better.  No significant increased swelling. Planning for lower extremity angiography - 05/21/21.  Her main complaint is knee pain.  She reports otherwise doing well.  No chest pain.  Breathing stable.  No increased cough or congestion.  No abdominal pain.  Bowels moving.  Wanting to have her surgery asap.     Past Medical History:  Diagnosis Date   Aortic atherosclerosis (HCC)    Barrett's esophagus    Cardiac murmur    CKD (chronic kidney disease), stage III (HCC)    Dyspnea on exertion    GERD (gastroesophageal reflux disease)    Gout    History of 2019 novel coronavirus disease (COVID-19) 08/2020   History of colon cancer    adenomatous polyps   Hypercholesterolemia    Hypertension    ILD (interstitial lung disease) ([place])    a.) 2/2 scleroderma Dx   Inflammatory arthritis    LBBB (left bundle branch block)    Long term current use of antithrombotics/antiplatelets    a.) ASA + clopidogrel   Long term current use of immunosuppressive drug    a.) MTX for RA and scleroderma Dx   OSA on CPAP    Osteoarthritis    knees, spine   PAD (peripheral artery  disease) (HCC)    Phlebitis    x2 (with pregnancy)   Pulmonary fibrosis (HCC)    mild   Pulmonary hypertension (HCC)    Reflux esophagitis    Rheumatoid arthritis(714.0)    positive RF, FANA, RNP, negative CCP ab and anti DNA,.  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, sclerodactyly, telangiectasias   Skin cancer 2018   Supplemental oxygen dependent    a.) 2L/Mason PRN for DOE related to mild pulmonary fibrosis and pulmonary HTN   T2DM (type 2 diabetes mellitus) ([place])    Past Surgical History:  Procedure Laterality Date   ABDOMINAL AORTOGRAM W/LOWER EXTREMITY Left 05/16/2019   Procedure: ABDOMINAL AORTOGRAM W/LOWER EXTREMITY;  Surgeon: Lorretta Harp, MD;  Location: [facility];  Service: Cardiovascular;  Laterality: Left;   AMPUTATION TOE Left 06/02/2019   Procedure: AMPUTATION TOE;  Surgeon: Albertine Patricia, DPM;  Location: [facility];  Service: Podiatry;  Laterality: Left;   APPENDECTOMY  1972   BREAST CYST ASPIRATION Left    neg   INCISION AND DRAINAGE OF WOUND Left 06/07/2019   Procedure: IRRIGATION AND DEBRIDEMENT WOUND;  Surgeon: Albertine Patricia, DPM;  Location: [facility];  Service: Podiatry;  Laterality: Left;   LOWER EXTREMITY ANGIOGRAPHY Left 06/03/2019   Procedure: Lower Extremity Angiography;  Surgeon: Delana Meyer,  Dolores Lory, MD;  Location: [facility];  Service: Cardiovascular;  Laterality: Left;   PERIPHERAL VASCULAR BALLOON ANGIOPLASTY Left 05/16/2019   Procedure: PERIPHERAL VASCULAR BALLOON ANGIOPLASTY;  Surgeon: Lorretta Harp, MD;  Location: [facility];  Service: Cardiovascular;  Laterality: Left;  SFA   TUBAL LIGATION  1972   VEIN LIGATION AND STRIPPING     Family History  Problem Relation Age of Onset   Arthritis Mother    Heart attack Father    Colon cancer Neg Hx    Breast cancer Neg Hx    Social History   Socioeconomic History   Marital status: Married    Spouse name: Not on file    Number of children: Not on file   Years of education: Not on file   Highest education level: Not on file  Occupational History   Not on file  Tobacco Use   Smoking status: Former    Packs/day: 0.50    Years: 15.00    Pack years: 7.50    Types: Cigarettes    Quit date: 05/12/1988    Years since quitting: 33.0   Smokeless tobacco: Never  Vaping Use   Vaping Use: Never used  Substance and Sexual Activity   Alcohol use: No    Alcohol/week: 0.0 standard drinks   Drug use: No   Sexual activity: Yes  Other Topics Concern   Not on file  Social History Narrative   Not on file   Social Determinants of Health   Financial Resource Strain: Low Risk    Difficulty of Paying Living Expenses: Not hard at all  Food Insecurity: No Food Insecurity   Worried About Charity fundraiser in the Last Year: Never true   [place] in the Last Year: Never true  Transportation Needs: No Transportation Needs   Lack of Transportation (Medical): No   Lack of Transportation (Non-Medical): No  Physical Activity: Not on file  Stress: No Stress Concern Present   Feeling of Stress : Not at all  Social Connections: Socially Integrated   Frequency of Communication with Friends and Family: More than three times a week   Frequency of Social Gatherings with Friends and Family: Not on file   Attends Religious Services: More than 4 times per year   Active Member of [employer] or Organizations: Yes   Attends Music therapist: More than 4 times per year   Marital Status: Married     Review of Systems  Constitutional:  Negative for appetite change and unexpected weight change.  HENT:  Negative for congestion and sinus pressure.   Respiratory:  Negative for cough, chest tightness and shortness of breath.   Cardiovascular:  Negative for chest pain and palpitations.       No increased swelling.    Gastrointestinal:  Negative for abdominal pain, diarrhea, nausea and vomiting.  Genitourinary:   Negative for difficulty urinating and dysuria.  Musculoskeletal:  Negative for myalgias.       Knee pain.    Skin:  Negative for color change and rash.  Neurological:  Negative for dizziness, light-headedness and headaches.  Psychiatric/Behavioral:  Negative for agitation and dysphoric mood.       Objective:     BP 122/70    Pulse 70    Temp 98 F (36.7 C)    Resp 16    Ht _0  (1.676 m)    Wt 225 lb 12.8 oz (102.4 kg)  LMP 05/25/1988    SpO2 96%    BMI 36.45 kg/m  Wt Readings from Last 3 Encounters:  05/17/21 225 lb 12.8 oz (102.4 kg)  05/08/21 227 lb (103 kg)  04/18/21 226 lb 9.6 oz (102.8 kg)    Physical Exam Vitals reviewed.  Constitutional:      General: She is not in acute distress.    Appearance: Normal appearance.  HENT:     Head: Normocephalic and atraumatic.     Right Ear: External ear normal.     Left Ear: External ear normal.  Eyes:     General: No scleral icterus.       Right eye: No discharge.        Left eye: No discharge.     Conjunctiva/sclera: Conjunctivae normal.  Neck:     Thyroid: No thyromegaly.  Cardiovascular:     Rate and Rhythm: Normal rate and regular rhythm.  Pulmonary:     Effort: No respiratory distress.     Breath sounds: Normal breath sounds. No wheezing.  Abdominal:     General: Bowel sounds are normal.     Palpations: Abdomen is soft.     Tenderness: There is no abdominal tenderness.  Musculoskeletal:        General: No tenderness.     Cervical back: Neck supple. No tenderness.     Comments: No increased swelling.    Lymphadenopathy:     Cervical: No cervical adenopathy.  Skin:    Findings: No erythema or rash.  Neurological:     Mental Status: She is alert.  Psychiatric:        Mood and Affect: Mood normal.        Behavior: Behavior normal.     Outpatient Encounter Medications as of 05/17/2021  Medication Sig   acetaminophen (TYLENOL) 650 MG CR tablet Take 650-1,300 mg by mouth every 8 (eight) hours as needed for pain.    allopurinol (ZYLOPRIM) 300 MG tablet Take 1 tablet by mouth once daily   amLODipine (NORVASC) 10 MG tablet TAKE 1 TABLET BY MOUTH DAILY   aspirin 81 MG tablet Take 81 mg by mouth daily. Reported on 07/24/2015   atorvastatin (LIPITOR) 40 MG tablet Take 1 tablet by mouth once daily   clopidogrel (PLAVIX) 75 MG tablet Take 1 tablet by mouth once daily with breakfast   fexofenadine (ALLEGRA) 180 MG tablet Take 1 tablet (180 mg total) by mouth daily. (Patient taking differently: Take 180 mg by mouth daily as needed for allergies.)   folic acid (FOLVITE) 1 MG tablet Take 1 mg by mouth every evening.    glucose blood (BAYER CONTOUR TEST) test strip USE  STRIP TO CHECK GLUCOSE TWICE DAILY. Dx: E11.9   ipratropium (ATROVENT) 0.06 % nasal spray Place 2 sprays into both nostrils 4 (four) times daily. (Patient taking differently: Place 2 sprays into both nostrils 4 (four) times daily as needed for rhinitis.)   losartan (COZAAR) 100 MG tablet Take 1 tablet by mouth once daily   metFORMIN (GLUCOPHAGE) 500 MG tablet Take 1 tablet by mouth once daily   methotrexate (RHEUMATREX) 2.5 MG tablet Take 10 mg by mouth every Wednesday.   metoprolol succinate (TOPROL-XL) 50 MG 24 hr tablet TAKE 1 TABLET BY MOUTH ONCE DAILY IMMEDIATELY FOLLOWING A MEAL   MICROLET LANCETS MISC Check sugar once daily, [employer] Microlet Lancets. Dx E11.9   pantoprazole (PROTONIX) 40 MG tablet Take 1 tablet by mouth once daily   triamcinolone cream (KENALOG) 0.1 % Apply  1 application topically 2 (two) times daily. To lower legs before putting on and/or after removing compression hose for redness in legs   trospium (SANCTURA) 20 MG tablet Take 1 tablet (20 mg total) by mouth at bedtime.   No facility-administered encounter medications on file as of 05/17/2021.     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  Followed by [facility].       Aortic atherosclerosis ([place])    Continue lipitor.       Barrett's esophagus    Has seen GI.  Protonix.  Follow.       CKD (chronic kidney disease) stage 3, GFR 30-59 ml/min (HCC)    Recent GFR improved.  Avoid antiinflammatories.  Stay hydrated.  Follow metabolic panel.       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       Diabetes mellitus ([place])    On metformin.  Low carb diet and exercise.  Follow met b and a1c.  Will hold metformin as discussed - around procedure.       Relevant Orders   Hemoglobin A1c   GERD (gastroesophageal reflux disease)    No upper symptoms reported.  Continue protonix.       Hypercholesterolemia    Continue lipitor.  Low cholesterol diet and exercise.  Follow lipid panel and liver function tests.        Relevant Orders   Lipid panel   Hepatic function panel   CBC with Differential/Platelet   Hypertension - Primary    Continue metoprolol, losartan and amlodipine.  Follow pressures.  Follow metabolic panel.       Relevant Orders   Basic metabolic panel   PAD (peripheral artery disease) ([place])    S/p angiogram, angioplasty and toe amputation.  Continue lipitor and plavix.  Planning for angiogram next week.  Vascular to evaluate as outlined prior to surgery.  Await recommendations.        Pulmonary hypertension, mild ([place])    Continue f/u with pulmonary.  Treat sleep apnea.        Renal angiolipoma    saw Dr Bernardo Heater  12/17/20.  MRI - left renal angiomyolipoma.  Recommended f/u renal ultrasound in one year.       Rheumatoid arthritis (HCC)    On MTX.  Followed by Dr Jefm Bryant.       Swelling of lower extremity    Appears improved.  Continue f/u with vascular surgery.       Thrombocytopenia ([place])    Most recent platelet count wnl.       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  Einar Pheasant, MD

## 2021-05-18 ENCOUNTER — Encounter: Payer: Self-pay | Admitting: Internal Medicine

## 2021-05-18 NOTE — Assessment & Plan Note (Signed)
Followed by Dr Kernodle.   

## 2021-05-18 NOTE — Assessment & Plan Note (Signed)
Has seen GI.  Protonix.  Follow.

## 2021-05-18 NOTE — Assessment & Plan Note (Signed)
Continue lipitor.  Low cholesterol diet and exercise.  Follow lipid panel and liver function tests.   

## 2021-05-18 NOTE — Assessment & Plan Note (Signed)
Continue metoprolol, losartan and amlodipine.  Follow pressures.  Follow metabolic panel.  

## 2021-05-18 NOTE — Assessment & Plan Note (Signed)
Continue f/u with pulmonary.  Treat sleep apnea.

## 2021-05-18 NOTE — Assessment & Plan Note (Signed)
Recent GFR improved.  Avoid antiinflammatories.  Stay hydrated.  Follow metabolic panel.

## 2021-05-18 NOTE — Assessment & Plan Note (Signed)
On metformin.  Low carb diet and exercise.  Follow met b and a1c.  Will hold metformin as discussed - around procedure.

## 2021-05-18 NOTE — Assessment & Plan Note (Signed)
Appears improved.  Continue f/u with vascular surgery.

## 2021-05-18 NOTE — Assessment & Plan Note (Signed)
Most recent platelet count wnl.

## 2021-05-18 NOTE — Assessment & Plan Note (Signed)
Continue lipitor  ?

## 2021-05-18 NOTE — Assessment & Plan Note (Signed)
saw Dr Stoioff 12/17/20.  MRI - left renal angiomyolipoma.  Recommended f/u renal ultrasound in one year.  

## 2021-05-18 NOTE — Assessment & Plan Note (Signed)
S/p angiogram, angioplasty and toe amputation.  Continue lipitor and plavix.  Planning for angiogram next week.  Vascular to evaluate as outlined prior to surgery.  Await recommendations.

## 2021-05-18 NOTE — Assessment & Plan Note (Signed)
No upper symptoms reported.  Continue protonix.  

## 2021-05-18 NOTE — Assessment & Plan Note (Signed)
S/p amputation.  Followed by [facility].  

## 2021-05-18 NOTE — Assessment & Plan Note (Signed)
On MTX.  Followed by Dr Jefm Bryant.

## 2021-05-18 NOTE — Assessment & Plan Note (Signed)
Continue compression hose.   

## 2021-05-20 ENCOUNTER — Other Ambulatory Visit (INDEPENDENT_AMBULATORY_CARE_PROVIDER_SITE_OTHER): Payer: Self-pay | Admitting: Nurse Practitioner

## 2021-05-20 ENCOUNTER — Encounter (INDEPENDENT_AMBULATORY_CARE_PROVIDER_SITE_OTHER): Payer: Self-pay | Admitting: Nurse Practitioner

## 2021-05-20 DIAGNOSIS — I739 Peripheral vascular disease, unspecified: Secondary | ICD-10-CM

## 2021-05-20 NOTE — H&P (View-Only) (Signed)
Subjective:    Patient ID: Nina Perez, female    DOB: Jun 13, 1940, 81 y.o.   MRN: [id number] Chief Complaint  Patient presents with   Follow-up    Per [name] needs knee replacement wants to have legs checked prior     Nina Perez  is a 81 year old female that presents today recommended by Dr. Nicki Reaper in evaluation for the coming right knee replacement.  The patient notes that she was being prepped for surgery when it was noticed that her right lower extremity was red and swollen and she was subsequently diagnosed with cellulitis.  There is additional concern that the patient.  History of PAD may have some underlying issues which may affect her ongoing wound healing and wanted to be evaluated prior to intervention.  The patient denies any significant claudication-like symptoms however due to her significant knee pain it is difficult to ascertain.  She does endorse having some numbness and pain and discomfort in her toes in the right lower extremity during the evening when she is in bed.  Currently there are no wounds or ulcerations.  The patient has had a previous history of amputation of the left foot as well as interventions on the left lower extremity.  Today the patient has an ABI of 1.13 on the right and 1.06 on the left.  She has a TBI of 0.23 on the right and 0.20 on the left.  She has noted biphasic tibial artery waveforms bilaterally however additional toe waveforms show all toe waveforms on the right lower extremity are nearly flat except for the fifth digit.   Review of Systems  Cardiovascular:  Positive for leg swelling.  Musculoskeletal:  Positive for arthralgias and joint swelling.  All other systems reviewed and are negative.     Objective:   Physical Exam Vitals reviewed.  HENT:     Head: Normocephalic.  Cardiovascular:     Rate and Rhythm: Normal rate.     Pulses:          Dorsalis pedis pulses are detected w/ Doppler on the right side and detected w/ Doppler on the left  side.       Posterior tibial pulses are detected w/ Doppler on the right side and detected w/ Doppler on the left side.  Pulmonary:     Effort: Pulmonary effort is normal.  Musculoskeletal:     Right lower leg: Edema present.  Skin:    General: Skin is warm and dry.  Neurological:     Mental Status: She is alert and oriented to person, place, and time.  Psychiatric:        Mood and Affect: Mood normal.        Behavior: Behavior normal.        Thought Content: Thought content normal.        Judgment: Judgment normal.    BP (!) 142/82    Pulse 76    Ht 5\' 6"  (1.676 m)    Wt 227 lb (103 kg)    LMP 05/25/1988    BMI 36.64 kg/m   Past Medical History:  Diagnosis Date   Aortic atherosclerosis (HCC)    Barrett's esophagus    Cardiac murmur    CKD (chronic kidney disease), stage III (HCC)    Dyspnea on exertion    GERD (gastroesophageal reflux disease)    Gout    History of 2019 novel coronavirus disease (COVID-19) 08/2020   History of colon cancer    adenomatous polyps  Hypercholesterolemia    Hypertension    ILD (interstitial lung disease) ([place])    a.) 2/2 scleroderma Dx   Inflammatory arthritis    LBBB (left bundle branch block)    Long term current use of antithrombotics/antiplatelets    a.) ASA + clopidogrel   Long term current use of immunosuppressive drug    a.) MTX for RA and scleroderma Dx   OSA on CPAP    Osteoarthritis    knees, spine   PAD (peripheral artery disease) (HCC)    Phlebitis    x2 (with pregnancy)   Pulmonary fibrosis (HCC)    mild   Pulmonary hypertension (HCC)    Reflux esophagitis    Rheumatoid arthritis(714.0)    positive RF, FANA, RNP, negative CCP ab and anti DNA,.  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, sclerodactyly, telangiectasias   Skin cancer 2018   Supplemental oxygen dependent    a.) 2L/[place] PRN for DOE related to mild pulmonary fibrosis and pulmonary HTN   T2DM (type 2 diabetes mellitus)  ([place])     Social History   Socioeconomic History   Marital status: Married    Spouse name: Not on file   Number of children: Not on file   Years of education: Not on file   Highest education level: Not on file  Occupational History   Not on file  Tobacco Use   Smoking status: Former    Packs/day: 0.50    Years: 15.00    Pack years: 7.50    Types: Cigarettes    Quit date: 05/12/1988    Years since quitting: 33.0   Smokeless tobacco: Never  Vaping Use   Vaping Use: Never used  Substance and Sexual Activity   Alcohol use: No    Alcohol/week: 0.0 standard drinks   Drug use: No   Sexual activity: Yes  Other Topics Concern   Not on file  Social History Narrative   Not on file   Social Determinants of Health   Financial Resource Strain: Low Risk    Difficulty of Paying Living Expenses: Not hard at all  Food Insecurity: No Food Insecurity   Worried About Charity fundraiser in the Last Year: Never true   Ran Out of Food in the Last Year: Never true  Transportation Needs: No Transportation Needs   Lack of Transportation (Medical): No   Lack of Transportation (Non-Medical): No  Physical Activity: Not on file  Stress: No Stress Concern Present   Feeling of Stress : Not at all  Social Connections: Socially Integrated   Frequency of Communication with Friends and Family: More than three times a week   Frequency of Social Gatherings with Friends and Family: Not on file   Attends Religious Services: More than 4 times per year   Active Member of Clubs or Organizations: Yes   Attends Music therapist: More than 4 times per year   Marital Status: Married  Human resources officer Violence: Not At Risk   Fear of Current or Ex-Partner: No   Emotionally Abused: No   Physically Abused: No   Sexually Abused: No    Past Surgical History:  Procedure Laterality Date   ABDOMINAL AORTOGRAM W/LOWER EXTREMITY Left 05/16/2019   Procedure: ABDOMINAL AORTOGRAM W/LOWER EXTREMITY;   Surgeon: Lorretta Harp, MD;  Location: [facility];  Service: Cardiovascular;  Laterality: Left;   AMPUTATION TOE Left 06/02/2019   Procedure: AMPUTATION TOE;  Surgeon: Albertine Patricia, DPM;  Location: [facility];  Service: Podiatry;  Laterality: Left;   APPENDECTOMY  1972   BREAST CYST ASPIRATION Left    neg   INCISION AND DRAINAGE OF WOUND Left 06/07/2019   Procedure: IRRIGATION AND DEBRIDEMENT WOUND;  Surgeon: Albertine Patricia, DPM;  Location: [facility];  Service: Podiatry;  Laterality: Left;   LOWER EXTREMITY ANGIOGRAPHY Left 06/03/2019   Procedure: Lower Extremity Angiography;  Surgeon: Katha Cabal, MD;  Location: [facility];  Service: Cardiovascular;  Laterality: Left;   PERIPHERAL VASCULAR BALLOON ANGIOPLASTY Left 05/16/2019   Procedure: PERIPHERAL VASCULAR BALLOON ANGIOPLASTY;  Surgeon: Lorretta Harp, MD;  Location: [facility];  Service: Cardiovascular;  Laterality: Left;  SFA   TUBAL LIGATION  1972   VEIN LIGATION AND STRIPPING      Family History  Problem Relation Age of Onset   Arthritis Mother    Heart attack Father    Colon cancer Neg Hx    Breast cancer Neg Hx     Allergies  Allergen Reactions   Maxzide [Triamterene-Hctz] Other (See Comments)    Weakness/fatigue    Tape Other (See Comments)    Ob band aid adhesive  Pull skin off / Rash   Cephalexin Rash    Blotchiness to B lower legs after several days of cephalexin for UTI   Hydrochlorothiazide W-Triamterene     Other reaction(s): Other (See Comments) Extreme fatigue/weakness    CBC Latest Ref Rng & Units 03/25/2021 10/02/2020 09/28/2020  WBC 4.0 - 10.5 K/uL 6.0 5.3 4.9  Hemoglobin 12.0 - 15.0 g/dL 12.9 12.8 12.8  Hematocrit 36.0 - 46.0 % 40.7 40.2 39.1  Platelets 150 - 400 K/uL 186 152.0 135.0(L)      CMP     Component Value Date/Time   NA 139 05/15/2021 0752   NA 138 04/29/2019 0917   K 4.1 05/15/2021 0752   CL 105 05/15/2021 0752   CO2 26 05/15/2021 0752    GLUCOSE 95 05/15/2021 0752   BUN 23 05/15/2021 0752   BUN 19 04/29/2019 0917   CREATININE 0.97 05/15/2021 0752   CALCIUM 9.8 05/15/2021 0752   PROT 7.1 05/15/2021 0752   ALBUMIN 3.8 05/15/2021 0752   AST 19 05/15/2021 0752   ALT 14 05/15/2021 0752   ALKPHOS 70 05/15/2021 0752   BILITOT 0.7 05/15/2021 0752   GFRNONAA 51 (L) 03/25/2021 1112   GFRAA >60 06/09/2019 0644     VAS [place] ABI WITH/WO TBI  Result Date: 05/16/2021  LOWER EXTREMITY DOPPLER STUDY Patient Name:  Nina Perez  Date of Exam:   05/08/2021 Medical Rec #: [id number]     Accession #:    [id number] Date of Birth: 1940/07/16     Patient Gender: F Patient Age:   13 years Exam Location:  [facility] Procedure:      VAS [place] ABI WITH/WO TBI Referring Phys: --------------------------------------------------------------------------------  Indications: Peripheral artery disease.  Vascular Interventions: 05/16/19: Left SFA angioplasty;                         06/03/19: Left ATA angioplasty;. Performing Technologist: Blondell Reveal RT, RDMS, RVT  Examination Guidelines: A complete evaluation includes at minimum, Doppler waveform signals and systolic blood pressure reading at the level of bilateral brachial, anterior tibial, and posterior tibial arteries, when vessel segments are accessible. Bilateral testing is considered an integral part of a complete examination. Photoelectric Plethysmograph (PPG) waveforms and toe  systolic pressure readings are included as required and additional duplex testing as needed. Limited examinations for reoccurring indications may be performed as noted.  ABI Findings: +---------+------------------+-----+--------+--------+  Right     Rt Pressure (mmHg) Index Waveform Comment   +---------+------------------+-----+--------+--------+  Brachial  172                                         +---------+------------------+-----+--------+--------+  ATA       195                1.13  biphasic            +---------+------------------+-----+--------+--------+  PTA       190                1.10  biphasic           +---------+------------------+-----+--------+--------+  Great Toe 39                 0.23                     +---------+------------------+-----+--------+--------+ +---------+------------------+-----+--------+-------+  Left      Lt Pressure (mmHg) Index Waveform Comment  +---------+------------------+-----+--------+-------+  Brachial  171                                        +---------+------------------+-----+--------+-------+  ATA       183                1.06  biphasic          +---------+------------------+-----+--------+-------+  PTA       180                1.05  biphasic          +---------+------------------+-----+--------+-------+  Great Toe 34                 0.20                    +---------+------------------+-----+--------+-------+ TOES Findings: +----------+---------------+--------+-------+  Right Toes Pressure (mmHg) Waveform Comment  +----------+---------------+--------+-------+  1st Digit                  Abnormal          +----------+---------------+--------+-------+  2nd Digit                  Abnormal          +----------+---------------+--------+-------+  3rd Digit                  Abnormal          +----------+---------------+--------+-------+  4th Digit                  Abnormal          +----------+---------------+--------+-------+  5th Digit                  Normal            +----------+---------------+--------+-------+  +---------+---------------+------------+----------+  Left Toes Pressure (mmHg) Waveform     Comment     +---------+---------------+------------+----------+  1st Digit                 Abnormal                 +---------+---------------+------------+----------+  2nd Digit                 not detected             +---------+---------------+------------+----------+  3rd Digit                 Abnormal                  +---------+---------------+------------+----------+  4th Digit                              amputation  +---------+---------------+------------+----------+  5th Digit                 not detected             +---------+---------------+------------+----------+  Bilateral ABIs appear essentially unchanged compared to prior study on 02/24/20. Bilateral TBIs appear decreased.  Summary: Right: Resting right ankle-brachial index is within normal range. No evidence of significant right lower extremity arterial disease. The right toe-brachial index is abnormal. Abnormal PPG waveforms of digits noted, as described above. Left: Resting left ankle-brachial index is within normal range. No evidence of significant left lower extremity arterial disease. The left toe-brachial index is abnormal. Abnormal PPG waveforms of digits noted, as described above.  *See table(s) above for measurements and observations.  Electronically signed by Hortencia Pilar MD on 05/16/2021 at 4:29:03 PM.    Final        Assessment & Plan:   1. PAD (peripheral artery disease) (HCC) Recommend:  The patient has an upcoming knee surgery and there is concern for ability to heal following the surgery.  The patient has diminished TBI's with a newly altered waveforms dampened and describes mild rest pain like symptoms.  Given the severity of the patient's lower extremity symptoms the patient should undergo right lower extremity angiography and intervention.  Risk and benefits were reviewed the patient.  Indications for the procedure were reviewed.  All questions were answered, the patient agrees to proceed.   The patient should continue walking and begin a more formal exercise program.  The patient should continue antiplatelet therapy and aggressive treatment of the lipid abnormalities  The patient will follow up with me after the angiogram.    2. Primary hypertension Continue antihypertensive medications as already ordered, these medications  have been reviewed and there are no changes at this time.   3. Hypercholesterolemia Continue statin as ordered and reviewed, no changes at this time    Current Outpatient Medications on File Prior to Visit  Medication Sig Dispense Refill   acetaminophen (TYLENOL) 650 MG CR tablet Take 650-1,300 mg by mouth every 8 (eight) hours as needed for pain.     amLODipine (NORVASC) 10 MG tablet TAKE 1 TABLET BY MOUTH DAILY 90 tablet 3   aspirin 81 MG tablet Take 81 mg by mouth daily. Reported on 07/24/2015     atorvastatin (LIPITOR) 40 MG tablet Take 1 tablet by mouth once daily 90 tablet 1   clopidogrel (PLAVIX) 75 MG tablet Take 1 tablet by mouth once daily with breakfast 90 tablet 0   fexofenadine (ALLEGRA) 180 MG tablet Take 1 tablet (180 mg total) by mouth daily. (Patient taking differently: Take 180 mg by mouth daily as needed for allergies.) 30 tablet 1   folic acid (FOLVITE) 1 MG tablet Take 1 mg by mouth every evening.      glucose blood (BAYER CONTOUR TEST) test strip USE  STRIP  TO CHECK GLUCOSE TWICE DAILY. Dx: E11.9 100 each 12   ipratropium (ATROVENT) 0.06 % nasal spray Place 2 sprays into both nostrils 4 (four) times daily. (Patient taking differently: Place 2 sprays into both nostrils 4 (four) times daily as needed for rhinitis.) 15 mL 12   losartan (COZAAR) 100 MG tablet Take 1 tablet by mouth once daily 90 tablet 1   metFORMIN (GLUCOPHAGE) 500 MG tablet Take 1 tablet by mouth once daily 90 tablet 0   methotrexate (RHEUMATREX) 2.5 MG tablet Take 10 mg by mouth every Wednesday.     metoprolol succinate (TOPROL-XL) 50 MG 24 hr tablet TAKE 1 TABLET BY MOUTH ONCE DAILY IMMEDIATELY FOLLOWING A MEAL 90 tablet 0   MICROLET LANCETS MISC Check sugar once daily, [employer] Microlet Lancets. Dx E11.9 100 each 12   pantoprazole (PROTONIX) 40 MG tablet Take 1 tablet by mouth once daily 90 tablet 1   triamcinolone cream (KENALOG) 0.1 % Apply 1 application topically 2 (two) times daily. To lower legs  before putting on and/or after removing compression hose for redness in legs 45 g 0   trospium (SANCTURA) 20 MG tablet Take 1 tablet (20 mg total) by mouth at bedtime. 30 tablet 0   No current facility-administered medications on file prior to visit.    There are no Patient Instructions on file for this visit. No follow-ups on file.   Kris Hartmann, NP

## 2021-05-20 NOTE — Progress Notes (Signed)
Subjective:    Patient ID: Nina Perez, female    DOB: 1941/03/20, 81 y.o.   MRN: [id number] Chief Complaint  Patient presents with   Follow-up    Per [name] needs knee replacement wants to have legs checked prior     Nina Perez  is a 81 year old female that presents today recommended by Dr. Nicki Reaper in evaluation for the coming right knee replacement.  The patient notes that she was being prepped for surgery when it was noticed that her right lower extremity was red and swollen and she was subsequently diagnosed with cellulitis.  There is additional concern that the patient.  History of PAD may have some underlying issues which may affect her ongoing wound healing and wanted to be evaluated prior to intervention.  The patient denies any significant claudication-like symptoms however due to her significant knee pain it is difficult to ascertain.  She does endorse having some numbness and pain and discomfort in her toes in the right lower extremity during the evening when she is in bed.  Currently there are no wounds or ulcerations.  The patient has had a previous history of amputation of the left foot as well as interventions on the left lower extremity.  Today the patient has an ABI of 1.13 on the right and 1.06 on the left.  She has a TBI of 0.23 on the right and 0.20 on the left.  She has noted biphasic tibial artery waveforms bilaterally however additional toe waveforms show all toe waveforms on the right lower extremity are nearly flat except for the fifth digit.   Review of Systems  Cardiovascular:  Positive for leg swelling.  Musculoskeletal:  Positive for arthralgias and joint swelling.  All other systems reviewed and are negative.     Objective:   Physical Exam Vitals reviewed.  HENT:     Head: Normocephalic.  Cardiovascular:     Rate and Rhythm: Normal rate.     Pulses:          Dorsalis pedis pulses are detected w/ Doppler on the right side and detected w/ Doppler on the left  side.       Posterior tibial pulses are detected w/ Doppler on the right side and detected w/ Doppler on the left side.  Pulmonary:     Effort: Pulmonary effort is normal.  Musculoskeletal:     Right lower leg: Edema present.  Skin:    General: Skin is warm and dry.  Neurological:     Mental Status: She is alert and oriented to person, place, and time.  Psychiatric:        Mood and Affect: Mood normal.        Behavior: Behavior normal.        Thought Content: Thought content normal.        Judgment: Judgment normal.    BP (!) 142/82    Pulse 76    Ht 5\' 6"  (1.676 m)    Wt 227 lb (103 kg)    LMP 05/25/1988    BMI 36.64 kg/m   Past Medical History:  Diagnosis Date   Aortic atherosclerosis (HCC)    Barrett's esophagus    Cardiac murmur    CKD (chronic kidney disease), stage III (HCC)    Dyspnea on exertion    GERD (gastroesophageal reflux disease)    Gout    History of 2019 novel coronavirus disease (COVID-19) 08/2020   History of colon cancer    adenomatous polyps  Hypercholesterolemia    Hypertension    ILD (interstitial lung disease) ([place])    a.) 2/2 scleroderma Dx   Inflammatory arthritis    LBBB (left bundle branch block)    Long term current use of antithrombotics/antiplatelets    a.) ASA + clopidogrel   Long term current use of immunosuppressive drug    a.) MTX for RA and scleroderma Dx   OSA on CPAP    Osteoarthritis    knees, spine   PAD (peripheral artery disease) (HCC)    Phlebitis    x2 (with pregnancy)   Pulmonary fibrosis (HCC)    mild   Pulmonary hypertension (HCC)    Reflux esophagitis    Rheumatoid arthritis(714.0)    positive RF, FANA, RNP, negative CCP ab and anti DNA,.  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, sclerodactyly, telangiectasias   Skin cancer 2018   Supplemental oxygen dependent    a.) 2L/[place] PRN for DOE related to mild pulmonary fibrosis and pulmonary HTN   T2DM (type 2 diabetes mellitus)  ([place])     Social History   Socioeconomic History   Marital status: Married    Spouse name: Not on file   Number of children: Not on file   Years of education: Not on file   Highest education level: Not on file  Occupational History   Not on file  Tobacco Use   Smoking status: Former    Packs/day: 0.50    Years: 15.00    Pack years: 7.50    Types: Cigarettes    Quit date: 05/12/1988    Years since quitting: 33.0   Smokeless tobacco: Never  Vaping Use   Vaping Use: Never used  Substance and Sexual Activity   Alcohol use: No    Alcohol/week: 0.0 standard drinks   Drug use: No   Sexual activity: Yes  Other Topics Concern   Not on file  Social History Narrative   Not on file   Social Determinants of Health   Financial Resource Strain: Low Risk    Difficulty of Paying Living Expenses: Not hard at all  Food Insecurity: No Food Insecurity   Worried About Charity fundraiser in the Last Year: Never true   Ran Out of Food in the Last Year: Never true  Transportation Needs: No Transportation Needs   Lack of Transportation (Medical): No   Lack of Transportation (Non-Medical): No  Physical Activity: Not on file  Stress: No Stress Concern Present   Feeling of Stress : Not at all  Social Connections: Socially Integrated   Frequency of Communication with Friends and Family: More than three times a week   Frequency of Social Gatherings with Friends and Family: Not on file   Attends Religious Services: More than 4 times per year   Active Member of Clubs or Organizations: Yes   Attends Music therapist: More than 4 times per year   Marital Status: Married  Human resources officer Violence: Not At Risk   Fear of Current or Ex-Partner: No   Emotionally Abused: No   Physically Abused: No   Sexually Abused: No    Past Surgical History:  Procedure Laterality Date   ABDOMINAL AORTOGRAM W/LOWER EXTREMITY Left 05/16/2019   Procedure: ABDOMINAL AORTOGRAM W/LOWER EXTREMITY;   Surgeon: Lorretta Harp, MD;  Location: [facility];  Service: Cardiovascular;  Laterality: Left;   AMPUTATION TOE Left 06/02/2019   Procedure: AMPUTATION TOE;  Surgeon: Albertine Patricia, DPM;  Location: [facility];  Service: Podiatry;  Laterality: Left;   APPENDECTOMY  1972   BREAST CYST ASPIRATION Left    neg   INCISION AND DRAINAGE OF WOUND Left 06/07/2019   Procedure: IRRIGATION AND DEBRIDEMENT WOUND;  Surgeon: Albertine Patricia, DPM;  Location: [facility];  Service: Podiatry;  Laterality: Left;   LOWER EXTREMITY ANGIOGRAPHY Left 06/03/2019   Procedure: Lower Extremity Angiography;  Surgeon: Katha Cabal, MD;  Location: [facility];  Service: Cardiovascular;  Laterality: Left;   PERIPHERAL VASCULAR BALLOON ANGIOPLASTY Left 05/16/2019   Procedure: PERIPHERAL VASCULAR BALLOON ANGIOPLASTY;  Surgeon: Lorretta Harp, MD;  Location: [facility];  Service: Cardiovascular;  Laterality: Left;  SFA   TUBAL LIGATION  1972   VEIN LIGATION AND STRIPPING      Family History  Problem Relation Age of Onset   Arthritis Mother    Heart attack Father    Colon cancer Neg Hx    Breast cancer Neg Hx     Allergies  Allergen Reactions   Maxzide [Triamterene-Hctz] Other (See Comments)    Weakness/fatigue    Tape Other (See Comments)    Ob band aid adhesive  Pull skin off / Rash   Cephalexin Rash    Blotchiness to B lower legs after several days of cephalexin for UTI   Hydrochlorothiazide W-Triamterene     Other reaction(s): Other (See Comments) Extreme fatigue/weakness    CBC Latest Ref Rng & Units 03/25/2021 10/02/2020 09/28/2020  WBC 4.0 - 10.5 K/uL 6.0 5.3 4.9  Hemoglobin 12.0 - 15.0 g/dL 12.9 12.8 12.8  Hematocrit 36.0 - 46.0 % 40.7 40.2 39.1  Platelets 150 - 400 K/uL 186 152.0 135.0(L)      CMP     Component Value Date/Time   NA 139 05/15/2021 0752   NA 138 04/29/2019 0917   K 4.1 05/15/2021 0752   CL 105 05/15/2021 0752   CO2 26 05/15/2021 0752    GLUCOSE 95 05/15/2021 0752   BUN 23 05/15/2021 0752   BUN 19 04/29/2019 0917   CREATININE 0.97 05/15/2021 0752   CALCIUM 9.8 05/15/2021 0752   PROT 7.1 05/15/2021 0752   ALBUMIN 3.8 05/15/2021 0752   AST 19 05/15/2021 0752   ALT 14 05/15/2021 0752   ALKPHOS 70 05/15/2021 0752   BILITOT 0.7 05/15/2021 0752   GFRNONAA 51 (L) 03/25/2021 1112   GFRAA >60 06/09/2019 0644     VAS [place] ABI WITH/WO TBI  Result Date: 05/16/2021  LOWER EXTREMITY DOPPLER STUDY Patient Name:  DESIRAE MANCUSI  Date of Exam:   05/08/2021 Medical Rec #: [id number]     Accession #:    [id number] Date of Birth: 13-Jun-1940     Patient Gender: F Patient Age:   63 years Exam Location:  [facility] Procedure:      VAS [place] ABI WITH/WO TBI Referring Phys: --------------------------------------------------------------------------------  Indications: Peripheral artery disease.  Vascular Interventions: 05/16/19: Left SFA angioplasty;                         06/03/19: Left ATA angioplasty;. Performing Technologist: Blondell Reveal RT, RDMS, RVT  Examination Guidelines: A complete evaluation includes at minimum, Doppler waveform signals and systolic blood pressure reading at the level of bilateral brachial, anterior tibial, and posterior tibial arteries, when vessel segments are accessible. Bilateral testing is considered an integral part of a complete examination. Photoelectric Plethysmograph (PPG) waveforms and toe  systolic pressure readings are included as required and additional duplex testing as needed. Limited examinations for reoccurring indications may be performed as noted.  ABI Findings: +---------+------------------+-----+--------+--------+  Right     Rt Pressure (mmHg) Index Waveform Comment   +---------+------------------+-----+--------+--------+  Brachial  172                                         +---------+------------------+-----+--------+--------+  ATA       195                1.13  biphasic            +---------+------------------+-----+--------+--------+  PTA       190                1.10  biphasic           +---------+------------------+-----+--------+--------+  Great Toe 39                 0.23                     +---------+------------------+-----+--------+--------+ +---------+------------------+-----+--------+-------+  Left      Lt Pressure (mmHg) Index Waveform Comment  +---------+------------------+-----+--------+-------+  Brachial  171                                        +---------+------------------+-----+--------+-------+  ATA       183                1.06  biphasic          +---------+------------------+-----+--------+-------+  PTA       180                1.05  biphasic          +---------+------------------+-----+--------+-------+  Great Toe 34                 0.20                    +---------+------------------+-----+--------+-------+ TOES Findings: +----------+---------------+--------+-------+  Right Toes Pressure (mmHg) Waveform Comment  +----------+---------------+--------+-------+  1st Digit                  Abnormal          +----------+---------------+--------+-------+  2nd Digit                  Abnormal          +----------+---------------+--------+-------+  3rd Digit                  Abnormal          +----------+---------------+--------+-------+  4th Digit                  Abnormal          +----------+---------------+--------+-------+  5th Digit                  Normal            +----------+---------------+--------+-------+  +---------+---------------+------------+----------+  Left Toes Pressure (mmHg) Waveform     Comment     +---------+---------------+------------+----------+  1st Digit                 Abnormal                 +---------+---------------+------------+----------+  2nd Digit                 not detected             +---------+---------------+------------+----------+  3rd Digit                 Abnormal                  +---------+---------------+------------+----------+  4th Digit                              amputation  +---------+---------------+------------+----------+  5th Digit                 not detected             +---------+---------------+------------+----------+  Bilateral ABIs appear essentially unchanged compared to prior study on 02/24/20. Bilateral TBIs appear decreased.  Summary: Right: Resting right ankle-brachial index is within normal range. No evidence of significant right lower extremity arterial disease. The right toe-brachial index is abnormal. Abnormal PPG waveforms of digits noted, as described above. Left: Resting left ankle-brachial index is within normal range. No evidence of significant left lower extremity arterial disease. The left toe-brachial index is abnormal. Abnormal PPG waveforms of digits noted, as described above.  *See table(s) above for measurements and observations.  Electronically signed by Hortencia Pilar MD on 05/16/2021 at 4:29:03 PM.    Final        Assessment & Plan:   1. PAD (peripheral artery disease) (HCC) Recommend:  The patient has an upcoming knee surgery and there is concern for ability to heal following the surgery.  The patient has diminished TBI's with a newly altered waveforms dampened and describes mild rest pain like symptoms.  Given the severity of the patient's lower extremity symptoms the patient should undergo right lower extremity angiography and intervention.  Risk and benefits were reviewed the patient.  Indications for the procedure were reviewed.  All questions were answered, the patient agrees to proceed.   The patient should continue walking and begin a more formal exercise program.  The patient should continue antiplatelet therapy and aggressive treatment of the lipid abnormalities  The patient will follow up with me after the angiogram.    2. Primary hypertension Continue antihypertensive medications as already ordered, these medications  have been reviewed and there are no changes at this time.   3. Hypercholesterolemia Continue statin as ordered and reviewed, no changes at this time    Current Outpatient Medications on File Prior to Visit  Medication Sig Dispense Refill   acetaminophen (TYLENOL) 650 MG CR tablet Take 650-1,300 mg by mouth every 8 (eight) hours as needed for pain.     amLODipine (NORVASC) 10 MG tablet TAKE 1 TABLET BY MOUTH DAILY 90 tablet 3   aspirin 81 MG tablet Take 81 mg by mouth daily. Reported on 07/24/2015     atorvastatin (LIPITOR) 40 MG tablet Take 1 tablet by mouth once daily 90 tablet 1   clopidogrel (PLAVIX) 75 MG tablet Take 1 tablet by mouth once daily with breakfast 90 tablet 0   fexofenadine (ALLEGRA) 180 MG tablet Take 1 tablet (180 mg total) by mouth daily. (Patient taking differently: Take 180 mg by mouth daily as needed for allergies.) 30 tablet 1   folic acid (FOLVITE) 1 MG tablet Take 1 mg by mouth every evening.      glucose blood (BAYER CONTOUR TEST) test strip USE  STRIP  TO CHECK GLUCOSE TWICE DAILY. Dx: E11.9 100 each 12   ipratropium (ATROVENT) 0.06 % nasal spray Place 2 sprays into both nostrils 4 (four) times daily. (Patient taking differently: Place 2 sprays into both nostrils 4 (four) times daily as needed for rhinitis.) 15 mL 12   losartan (COZAAR) 100 MG tablet Take 1 tablet by mouth once daily 90 tablet 1   metFORMIN (GLUCOPHAGE) 500 MG tablet Take 1 tablet by mouth once daily 90 tablet 0   methotrexate (RHEUMATREX) 2.5 MG tablet Take 10 mg by mouth every Wednesday.     metoprolol succinate (TOPROL-XL) 50 MG 24 hr tablet TAKE 1 TABLET BY MOUTH ONCE DAILY IMMEDIATELY FOLLOWING A MEAL 90 tablet 0   MICROLET LANCETS MISC Check sugar once daily, [employer] Microlet Lancets. Dx E11.9 100 each 12   pantoprazole (PROTONIX) 40 MG tablet Take 1 tablet by mouth once daily 90 tablet 1   triamcinolone cream (KENALOG) 0.1 % Apply 1 application topically 2 (two) times daily. To lower legs  before putting on and/or after removing compression hose for redness in legs 45 g 0   trospium (SANCTURA) 20 MG tablet Take 1 tablet (20 mg total) by mouth at bedtime. 30 tablet 0   No current facility-administered medications on file prior to visit.    There are no Patient Instructions on file for this visit. No follow-ups on file.   Kris Hartmann, NP

## 2021-05-21 ENCOUNTER — Encounter: Payer: Self-pay | Admitting: Vascular Surgery

## 2021-05-21 ENCOUNTER — Encounter
Admission: RE | Disposition: A | Discharge status: Home / Self Care | Payer: Self-pay | Source: Ambulatory Visit | Attending: Vascular Surgery

## 2021-05-21 ENCOUNTER — Ambulatory Visit
Admission: RE | Admit: 2021-05-21 | Discharge: 2021-05-21 | Disposition: A | Discharge status: Home / Self Care | Payer: Medicare Other | Source: Ambulatory Visit | Attending: Vascular Surgery | Admitting: Vascular Surgery

## 2021-05-21 ENCOUNTER — Other Ambulatory Visit: Payer: Self-pay

## 2021-05-21 DIAGNOSIS — I129 Hypertensive chronic kidney disease with stage 1 through stage 4 chronic kidney disease, or unspecified chronic kidney disease: Secondary | ICD-10-CM | POA: Insufficient documentation

## 2021-05-21 DIAGNOSIS — E7800 Pure hypercholesterolemia, unspecified: Secondary | ICD-10-CM | POA: Diagnosis not present

## 2021-05-21 DIAGNOSIS — I70229 Atherosclerosis of native arteries of extremities with rest pain, unspecified extremity: Secondary | ICD-10-CM

## 2021-05-21 DIAGNOSIS — E1122 Type 2 diabetes mellitus with diabetic chronic kidney disease: Secondary | ICD-10-CM | POA: Insufficient documentation

## 2021-05-21 DIAGNOSIS — E1151 Type 2 diabetes mellitus with diabetic peripheral angiopathy without gangrene: Secondary | ICD-10-CM | POA: Insufficient documentation

## 2021-05-21 DIAGNOSIS — I70213 Atherosclerosis of native arteries of extremities with intermittent claudication, bilateral legs: Secondary | ICD-10-CM | POA: Diagnosis not present

## 2021-05-21 DIAGNOSIS — I70221 Atherosclerosis of native arteries of extremities with rest pain, right leg: Secondary | ICD-10-CM | POA: Insufficient documentation

## 2021-05-21 DIAGNOSIS — I739 Peripheral vascular disease, unspecified: Secondary | ICD-10-CM

## 2021-05-21 DIAGNOSIS — N183 Chronic kidney disease, stage 3 unspecified: Secondary | ICD-10-CM | POA: Insufficient documentation

## 2021-05-21 HISTORY — PX: LOWER EXTREMITY ANGIOGRAPHY: CATH118251

## 2021-05-21 LAB — CREATININE, SERUM
Creatinine, Ser: 0.95 mg/dL (ref 0.44–1.00)
GFR, Estimated: >60 mL/min (ref >60)

## 2021-05-21 LAB — GLUCOSE, CAPILLARY: Glucose-Capillary: 107 mg/dL — ABNORMAL HIGH (ref 70–99)

## 2021-05-21 LAB — BUN: BUN: 19 mg/dL (ref 8–23)

## 2021-05-21 SURGERY — LOWER EXTREMITY ANGIOGRAPHY
Anesthesia: Moderate Sedation | Site: Leg Lower | Laterality: Right

## 2021-05-21 MED ORDER — FENTANYL CITRATE (PF) 100 MCG/2ML IJ SOLN
INTRAMUSCULAR | Status: DC | PRN
  Administered 2021-05-21: 50 ug via INTRAVENOUS

## 2021-05-21 MED ORDER — MORPHINE SULFATE (PF) 4 MG/ML IV SOLN: 2.0000 mg | INTRAVENOUS | Status: DC | PRN

## 2021-05-21 MED ORDER — OXYCODONE HCL 5 MG PO TABS: 5.0000 mg | ORAL_TABLET | ORAL | Status: DC | PRN

## 2021-05-21 MED ORDER — SODIUM CHLORIDE 0.9 % IV SOLN: INTRAVENOUS | Status: DC

## 2021-05-21 MED ORDER — MIDAZOLAM HCL 2 MG/2ML IJ SOLN
INTRAMUSCULAR | Status: AC
  Filled 2021-05-21: qty 2

## 2021-05-21 MED ORDER — FENTANYL CITRATE (PF) 100 MCG/2ML IJ SOLN
INTRAMUSCULAR | Status: AC
  Filled 2021-05-21: qty 2

## 2021-05-21 MED ORDER — HEPARIN SODIUM (PORCINE) 1000 UNIT/ML IJ SOLN
INTRAMUSCULAR | Status: AC
  Filled 2021-05-21: qty 10

## 2021-05-21 MED ORDER — LABETALOL HCL 5 MG/ML IV SOLN: 10.0000 mg | INTRAVENOUS | Status: DC | PRN

## 2021-05-21 MED ORDER — METHYLPREDNISOLONE SODIUM SUCC 125 MG IJ SOLR: 125.0000 mg | Freq: Once | INTRAMUSCULAR | Status: DC | PRN

## 2021-05-21 MED ORDER — SODIUM CHLORIDE 0.9% FLUSH: 3.0000 mL | Freq: Two times a day (BID) | INTRAVENOUS | Status: DC

## 2021-05-21 MED ORDER — SODIUM CHLORIDE 0.9% FLUSH: 3.0000 mL | INTRAVENOUS | Status: DC | PRN

## 2021-05-21 MED ORDER — MIDAZOLAM HCL 2 MG/2ML IJ SOLN
INTRAMUSCULAR | Status: DC | PRN
  Administered 2021-05-21: 2 mg via INTRAVENOUS

## 2021-05-21 MED ORDER — HYDRALAZINE HCL 20 MG/ML IJ SOLN: 5.0000 mg | INTRAMUSCULAR | Status: DC | PRN

## 2021-05-21 MED ORDER — CLINDAMYCIN PHOSPHATE 300 MG/50ML IV SOLN: 300.0000 mg | Freq: Once | INTRAVENOUS | Status: AC

## 2021-05-21 MED ORDER — HYDROMORPHONE HCL 1 MG/ML IJ SOLN: 1.0000 mg | Freq: Once | INTRAMUSCULAR | Status: DC | PRN

## 2021-05-21 MED ORDER — ONDANSETRON HCL 4 MG/2ML IJ SOLN: 4.0000 mg | Freq: Four times a day (QID) | INTRAMUSCULAR | Status: DC | PRN

## 2021-05-21 MED ORDER — DIPHENHYDRAMINE HCL 50 MG/ML IJ SOLN: 50.0000 mg | Freq: Once | INTRAMUSCULAR | Status: DC | PRN

## 2021-05-21 MED ORDER — MIDAZOLAM HCL 2 MG/ML PO SYRP: 8.0000 mg | ORAL_SOLUTION | Freq: Once | ORAL | Status: DC | PRN

## 2021-05-21 MED ORDER — ACETAMINOPHEN 325 MG PO TABS: 650.0000 mg | ORAL_TABLET | ORAL | Status: DC | PRN

## 2021-05-21 MED ORDER — CLINDAMYCIN PHOSPHATE 300 MG/50ML IV SOLN
INTRAVENOUS | Status: AC
  Administered 2021-05-21: 300 mg via INTRAVENOUS
  Filled 2021-05-21: qty 50

## 2021-05-21 MED ORDER — FAMOTIDINE 20 MG PO TABS: 40.0000 mg | ORAL_TABLET | Freq: Once | ORAL | Status: DC | PRN

## 2021-05-21 MED ORDER — SODIUM CHLORIDE 0.9 % IV SOLN: 250.0000 mL | INTRAVENOUS | Status: DC | PRN

## 2021-05-21 SURGICAL SUPPLY — 12 items
CANNULA 5F STIFF (CANNULA) ×1 IMPLANT
CATH ANGIO 5F PIGTAIL 65CM (CATHETERS) ×1 IMPLANT
COVER PROBE U/S 5X48 (MISCELLANEOUS) ×1 IMPLANT
DEVICE STARCLOSE SE CLOSURE (Vascular Products) ×1 IMPLANT
GAUZE SPONGE 4X4 12PLY STRL (GAUZE/BANDAGES/DRESSINGS) ×2 IMPLANT
GLIDEWIRE ADV .035X260CM (WIRE) ×1 IMPLANT
PACK ANGIOGRAPHY (CUSTOM PROCEDURE TRAY) ×2 IMPLANT
SHEATH BRITE TIP 5FRX11 (SHEATH) ×1 IMPLANT
SHIELD X-DRAPE GOLD 12X17 (MISCELLANEOUS) ×2 IMPLANT
SYR MEDRAD MARK 7 150ML (SYRINGE) ×1 IMPLANT
TUBING CONTRAST HIGH PRESS 72 (TUBING) ×1 IMPLANT
WIRE GUIDERIGHT .035X150 (WIRE) ×1 IMPLANT

## 2021-05-21 NOTE — Progress Notes (Deleted)
K+ : 6.0. Dr. Delana Meyer made aware. To admit pt. To hospital. Pt. And daughter made aware & agreeable to admission.

## 2021-05-21 NOTE — Op Note (Signed)
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  Abdominal aortogram  2.  Right lower extremity distal runoff third order catheter placement  3.  Ultrasound-guided access to the left common femoral artery  4.  StarClose left common femoral artery   Anesthesia: Conscious sedation was administered by the interventional radiology RN under my direct supervision. IV Versed plus fentanyl were utilized. Continuous ECG, pulse oximetry and blood pressure was monitored throughout the entire procedure.  Conscious sedation was administered for a total of 30 minutes.  Sheath: 5 French 11 cm Pinnacle sheath left common femoral retrograde  Contrast: 40 cc   Fluoroscopy Time: 2.2 minutes  Indications:  The patient presents to [facility] with claudication symptoms and noninvasive's studies suggesting moderate atherosclerotic occlusive disease.  Pedal pulses are nonpalpable bilaterally suggesting atherosclerotic occlusive disease.  She has right knee surgery coming up and there is concern regarding her healing potential and therefore she is undergoing angiography with the hope for intervention if needed.    The risks and benefits as well as alternative therapies for lower extremity revascularization are reviewed with the patient all questions are answered the patient agrees to proceed.  The patient is therefore undergoing angiography with the hope for intervention.   Procedure:  Nina Perez a 81 y.o. female who was identified and appropriate procedural time out was performed.  The patient was then placed supine on the table and prepped and draped in the usual sterile fashion.  Ultrasound was  used to evaluate the left common femoral artery.  It was echolucent and pulsatile indicating it is patent .  An ultrasound image was acquired for the permanent record.  A micropuncture needle was used to access the left common femoral artery under direct ultrasound guidance.  The microwire was then advanced under fluoroscopic guidance without difficulty followed by the micro-sheath.  A 0.035 J wire was advanced without resistance and a 5Fr sheath was placed.    Pigtail catheter was then advanced to the level of T12 and AP projection of the aorta was obtained. Pigtail catheter was then repositioned to above the bifurcation and LAO view of the pelvis was obtained. Stiff angled Glidewire and pigtail catheter was then used across the bifurcation and the catheter was positioned in the distal external iliac artery.  RAO of the right groin was then obtained. Wire was reintroduced and negotiated into the SFA and the catheter was advanced into the SFA. Distal runoff was then performed.  After review of the images the catheter was removed over wire and an LAO view of the groin was obtained. StarClose device was deployed without difficulty.   Findings:   Aortogram: Abdominal aorta is opacified with bolus injection contrast.  There are mild atherosclerotic changes.  There are bilateral single renal arteries no evidence of renal artery stenosis.  Aortic bifurcation again shows some mild calcific disease but no hemodynamically significant stenosis.  Right Lower Extremity: The right common femoral profundofemoral superficial femoral and popliteal arteries are widely patent.  There is mild atherosclerotic changes noted particularly at Hunter's canal but there are no hemodynamically significant stenoses.  Trifurcation demonstrates severe disease with occlusion of the anterior tibial just after its origin.  Anterior tibial remains occluded down to the level of the ankle.  The  posterior tibial is also occluded from its origin  down to the foot and there is nonvisualization of the plantar arteries.  The tibioperoneal trunk and peroneal are widely patent with mild atherosclerotic changes.  There is a large collateral from the distal peroneal that reconstitutes the dorsalis pedis and there is filling of the pedal arch.  Left Lower Extremity: Common femoral origins of the profunda femoris and SFA are widely patent.   Disposition: Patient was taken to the recovery room in stable condition having tolerated the procedure well.  Nina Perez 05/21/2021,9:40 AM

## 2021-05-21 NOTE — Interval H&P Note (Signed)
History and Physical Interval Note:  05/21/2021 7:54 AM  Nina Perez  has presented today for surgery, with the diagnosis of RLE Angio  BARD   ASO w rest pain.  The various methods of treatment have been discussed with the patient and family. After consideration of risks, benefits and other options for treatment, the patient has consented to  Procedure(s): LOWER EXTREMITY ANGIOGRAPHY (Right) as a surgical intervention.  The patient's history has been reviewed, patient examined, no change in status, stable for surgery.  I have reviewed the patient's chart and labs.  Questions were answered to the patient's satisfaction.     Hortencia Pilar

## 2021-05-21 NOTE — Progress Notes (Deleted)
Unsuccessful attempt x 5 for Pt. IV access. MD made aware . MD asks to have blood drawn off perm cath for K+ before can can take her back for a procedure.

## 2021-05-21 NOTE — Progress Notes (Signed)
Dr. Delana Meyer at bedside now to speak with pt. And her husband re: procedural results. Both verbalized understanding of conversation. Pt. To f/u with MD in one month.

## 2021-05-22 ENCOUNTER — Encounter: Payer: Self-pay | Admitting: Vascular Surgery

## 2021-06-03 NOTE — Progress Notes (Signed)
MRN : [id number]  Nina Perez is a 81 y.o. (23-Dec-1940) female who presents with chief complaint of check circulation.  History of Present Illness:   The patient returns to the office for followup and review status post angiogram without intervention on 05/21/2021.   Procedure: Diagnostic right leg angiogram: The femoral popliteal system is widely patent.  The tibioperoneal trunk and peroneal are widely patent with mild atherosclerotic changes.  There is a large collateral from the distal peroneal that reconstitutes the dorsalis pedis and there is filling of the pedal arch.  The patient notes improvement in the lower extremity symptoms. No interval shortening of the patient's claudication distance or rest pain symptoms. Previous wounds have now healed.  No new ulcers or wounds have occurred since the last visit.  There have been no significant changes to the patient's overall health care.  The patient denies amaurosis fugax or recent TIA symptoms. There are no recent neurological changes noted. The patient denies history of DVT, PE or superficial thrombophlebitis. The patient denies recent episodes of angina or shortness of breath.    No outpatient medications have been marked as taking for the 06/06/21 encounter (Appointment) with Delana Meyer, Dolores Lory, MD.    Past Medical History:  Diagnosis Date   Aortic atherosclerosis ([place])    Barrett's esophagus    Cardiac murmur    CKD (chronic kidney disease), stage III ([place])    Dyspnea on exertion    GERD (gastroesophageal reflux disease)    Gout    History of 2019 novel coronavirus disease (COVID-19) 08/2020   History of colon cancer    adenomatous polyps   Hypercholesterolemia    Hypertension    ILD (interstitial lung disease) ([place])    a.) 2/2 scleroderma Dx   Inflammatory arthritis    LBBB (left bundle branch block)    Long term current use of antithrombotics/antiplatelets    a.) ASA + clopidogrel   Long term current use of  immunosuppressive drug    a.) MTX for RA and scleroderma Dx   OSA on CPAP    Osteoarthritis    knees, spine   PAD (peripheral artery disease) (HCC)    Phlebitis    x2 (with pregnancy)   Pulmonary fibrosis (HCC)    mild   Pulmonary hypertension (HCC)    Reflux esophagitis    Rheumatoid arthritis(714.0)    positive RF, FANA, RNP, negative CCP ab and anti DNA,.  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, sclerodactyly, telangiectasias   Skin cancer 2018   Supplemental oxygen dependent    a.) 2L/[place] PRN for DOE related to mild pulmonary fibrosis and pulmonary HTN   T2DM (type 2 diabetes mellitus) ([place])     Past Surgical History:  Procedure Laterality Date   ABDOMINAL AORTOGRAM W/LOWER EXTREMITY Left 05/16/2019   Procedure: ABDOMINAL AORTOGRAM W/LOWER EXTREMITY;  Surgeon: Lorretta Harp, MD;  Location: [facility];  Service: Cardiovascular;  Laterality: Left;   AMPUTATION TOE Left 06/02/2019   Procedure: AMPUTATION TOE;  Surgeon: Albertine Patricia, DPM;  Location: [facility];  Service: Podiatry;  Laterality: Left;   APPENDECTOMY  1972   BREAST CYST ASPIRATION Left    neg   INCISION AND DRAINAGE OF WOUND Left 06/07/2019   Procedure: IRRIGATION AND DEBRIDEMENT WOUND;  Surgeon: Albertine Patricia, DPM;  Location: [facility];  Service: Podiatry;  Laterality: Left;   LOWER EXTREMITY ANGIOGRAPHY Left 06/03/2019   Procedure: Lower  Extremity Angiography;  Surgeon: Katha Cabal, MD;  Location: [facility];  Service: Cardiovascular;  Laterality: Left;   LOWER EXTREMITY ANGIOGRAPHY Right 05/21/2021   Procedure: LOWER EXTREMITY ANGIOGRAPHY;  Surgeon: Katha Cabal, MD;  Location: [facility];  Service: Cardiovascular;  Laterality: Right;   PERIPHERAL VASCULAR BALLOON ANGIOPLASTY Left 05/16/2019   Procedure: PERIPHERAL VASCULAR BALLOON ANGIOPLASTY;  Surgeon: Lorretta Harp, MD;  Location: [facility];  Service:  Cardiovascular;  Laterality: Left;  SFA   TUBAL LIGATION  1972   VEIN LIGATION AND STRIPPING      Social History Social History   Tobacco Use   Smoking status: Former    Packs/day: 0.50    Years: 15.00    Pack years: 7.50    Types: Cigarettes    Quit date: 05/12/1988    Years since quitting: 33.0   Smokeless tobacco: Never  Vaping Use   Vaping Use: Never used  Substance Use Topics   Alcohol use: No    Alcohol/week: 0.0 standard drinks   Drug use: No    Family History Family History  Problem Relation Age of Onset   Arthritis Mother    Heart attack Father    Colon cancer Neg Hx    Breast cancer Neg Hx     Allergies  Allergen Reactions   Maxzide [Triamterene-Hctz] Other (See Comments)    Weakness/fatigue    Tape Other (See Comments)    Ob band aid adhesive  Pull skin off / Rash   Cephalexin Rash    Blotchiness to B lower legs after several days of cephalexin for UTI   Hydrochlorothiazide W-Triamterene     Other reaction(s): Other (See Comments) Extreme fatigue/weakness     REVIEW OF SYSTEMS (Negative unless checked)  Constitutional: [] Weight loss  [] Fever  [] Chills Cardiac: [] Chest pain   [] Chest pressure   [] Palpitations   [] Shortness of breath when laying flat   [] Shortness of breath with exertion. Vascular:  [x] Pain in legs with walking   [x] Pain in legs at rest  [] History of DVT   [] Phlebitis   [x] Swelling in legs   [] Varicose veins   [] Non-healing ulcers Pulmonary:   [] Uses home oxygen   [] Productive cough   [] Hemoptysis   [] Wheeze  [] COPD   [] Asthma Neurologic:  [] Dizziness   [] Seizures   [] History of stroke   [] History of TIA  [] Aphasia   [] Vissual changes   [] Weakness or numbness in arm   [] Weakness or numbness in leg Musculoskeletal:   [] Joint swelling   [] Joint pain   [] Low back pain Hematologic:  [] Easy bruising  [] Easy bleeding   [] Hypercoagulable state   [] Anemic Gastrointestinal:  [] Diarrhea   [] Vomiting  [x] Gastroesophageal reflux/heartburn    [] Difficulty swallowing. Genitourinary:  [] Chronic kidney disease   [] Difficult urination  [] Frequent urination   [] Blood in urine Skin:  [] Rashes   [] Ulcers  Psychological:  [] History of anxiety   []  History of major depression.  Physical Examination  There were no vitals filed for this visit. There is no height or weight on file to calculate BMI. Gen: WD/WN, NAD Head: [place]/AT, No temporalis wasting.  Ear/Nose/Throat: Hearing grossly intact, nares w/o erythema or drainage Eyes: PER, EOMI, sclera nonicteric.  Neck: Supple, no masses.  No bruit or JVD.  Pulmonary:  Good air movement, no audible wheezing, no use of accessory muscles.  Cardiac: RRR, normal S1, S2, no Murmurs. Vascular:   1-2 plus edema of both legs right > left. Mild venous  changes to the skin Vessel Right Left  Radial Palpable Palpable  PT Not Palpable Not Palpable  DP Trace Palpable Trace Palpable  Gastrointestinal: soft, non-distended. No guarding/no peritoneal signs.  Musculoskeletal: M/S 5/5 throughout.  No visible deformity.  Neurologic: CN 2-12 intact. Pain and light touch intact in extremities.  Symmetrical.  Speech is fluent. Motor exam as listed above. Psychiatric: Judgment intact, Mood & affect appropriate for pt's clinical situation. Dermatologic: No rashes or ulcers noted.  No changes consistent with cellulitis.   CBC Lab Results  Component Value Date   WBC 6.0 03/25/2021   HGB 12.9 03/25/2021   HCT 40.7 03/25/2021   MCV 107.1 (H) 03/25/2021   PLT 186 03/25/2021    BMET    Component Value Date/Time   NA 139 05/15/2021 0752   NA 138 04/29/2019 0917   K 4.1 05/15/2021 0752   CL 105 05/15/2021 0752   CO2 26 05/15/2021 0752   GLUCOSE 95 05/15/2021 0752   BUN 19 05/21/2021 0834   BUN 19 04/29/2019 0917   CREATININE 0.95 05/21/2021 0834   CALCIUM 9.8 05/15/2021 0752   GFRNONAA >60 05/21/2021 0834   GFRAA >60 06/09/2019 0644   Estimated Creatinine Clearance: 57 mL/min (by C-G formula based on  SCr of 0.95 mg/dL).  COAG Lab Results  Component Value Date   INR 1.1 03/25/2021   INR 1.1 06/02/2019    Radiology PERIPHERAL VASCULAR CATHETERIZATION  Result Date: 05/21/2021 See surgical note for result.  VAS [place] ABI WITH/WO TBI  Result Date: 05/16/2021  LOWER EXTREMITY DOPPLER STUDY Patient Name:  Nina Perez  Date of Exam:   05/08/2021 Medical Rec #: [id number]     Accession #:    [id number] Date of Birth: 12-Nov-1940     Patient Gender: F Patient Age:   73 years Exam Location:  [facility] Procedure:      VAS [place] ABI WITH/WO TBI Referring Phys: --------------------------------------------------------------------------------  Indications: Peripheral artery disease.  Vascular Interventions: 05/16/19: Left SFA angioplasty;                         06/03/19: Left ATA angioplasty;. Performing Technologist: Blondell Reveal RT, RDMS, RVT  Examination Guidelines: A complete evaluation includes at minimum, Doppler waveform signals and systolic blood pressure reading at the level of bilateral brachial, anterior tibial, and posterior tibial arteries, when vessel segments are accessible. Bilateral testing is considered an integral part of a complete examination. Photoelectric Plethysmograph (PPG) waveforms and toe systolic pressure readings are included as required and additional duplex testing as needed. Limited examinations for reoccurring indications may be performed as noted.  ABI Findings: +---------+------------------+-----+--------+--------+  Right     Rt Pressure (mmHg) Index Waveform Comment   +---------+------------------+-----+--------+--------+  Brachial  172                                         +---------+------------------+-----+--------+--------+  ATA       195                1.13  biphasic           +---------+------------------+-----+--------+--------+  PTA       190                1.10  biphasic           +---------+------------------+-----+--------+--------+  Great Toe 39  0.23                     +---------+------------------+-----+--------+--------+ +---------+------------------+-----+--------+-------+  Left      Lt Pressure (mmHg) Index Waveform Comment  +---------+------------------+-----+--------+-------+  Brachial  171                                        +---------+------------------+-----+--------+-------+  ATA       183                1.06  biphasic          +---------+------------------+-----+--------+-------+  PTA       180                1.05  biphasic          +---------+------------------+-----+--------+-------+  Great Toe 34                 0.20                    +---------+------------------+-----+--------+-------+ TOES Findings: +----------+---------------+--------+-------+  Right Toes Pressure (mmHg) Waveform Comment  +----------+---------------+--------+-------+  1st Digit                  Abnormal          +----------+---------------+--------+-------+  2nd Digit                  Abnormal          +----------+---------------+--------+-------+  3rd Digit                  Abnormal          +----------+---------------+--------+-------+  4th Digit                  Abnormal          +----------+---------------+--------+-------+  5th Digit                  Normal            +----------+---------------+--------+-------+  +---------+---------------+------------+----------+  Left Toes Pressure (mmHg) Waveform     Comment     +---------+---------------+------------+----------+  1st Digit                 Abnormal                 +---------+---------------+------------+----------+  2nd Digit                 not detected             +---------+---------------+------------+----------+  3rd Digit                 Abnormal                 +---------+---------------+------------+----------+  4th Digit                              amputation  +---------+---------------+------------+----------+  5th Digit                 not detected              +---------+---------------+------------+----------+  Bilateral ABIs appear essentially unchanged compared to prior study on 02/24/20. Bilateral TBIs appear decreased.  Summary: Right: Resting right ankle-brachial index is within normal range. No evidence of significant right lower extremity arterial disease. The right  toe-brachial index is abnormal. Abnormal PPG waveforms of digits noted, as described above. Left: Resting left ankle-brachial index is within normal range. No evidence of significant left lower extremity arterial disease. The left toe-brachial index is abnormal. Abnormal PPG waveforms of digits noted, as described above.  *See table(s) above for measurements and observations.  Electronically signed by Hortencia Pilar MD on 05/16/2021 at 4:29:03 PM.    Final      Assessment/Plan 1. Atherosclerosis of native artery of right lower extremity with rest pain ([place])  Recommend:  The patient has evidence of atherosclerosis of the lower extremities with claudication.  This is primarily distal and her perfusion to the calf is normal.  She is cleared from a vascular standpoint for knee replacement surgery.    Noninvasive studies do not suggest clinically significant change.  No invasive studies, angiography or surgery at this time The patient should continue walking and begin a more formal exercise program.  The patient should continue antiplatelet therapy and aggressive treatment of the lipid abnormalities  No changes in the patient's medications at this time  The patient should continue wearing graduated compression socks 10-15 mmHg strength to control the mild edema.     A total of 30 minutes was spent with this patient and greater than 50% was spent in counseling and coordination of care with the patient.  Discussion included the treatment options for vascular disease including indications for surgery and intervention.  Also discussed is the appropriate timing of treatment.  In addition  medical therapy was discussed.   - VAS [place] ABI WITH/WO TBI; Future  2. Primary hypertension Continue antihypertensive medications as already ordered, these medications have been reviewed and there are no changes at this time.   3. Gastroesophageal reflux disease with esophagitis without hemorrhage Continue PPI as already ordered, this medication has been reviewed and there are no changes at this time.  Avoidence of caffeine and alcohol  Moderate elevation of the head of the bed    4. Type 2 diabetes mellitus with other circulatory complication, without long-term current use of insulin (HCC) Continue hypoglycemic medications as already ordered, these medications have been reviewed and there are no changes at this time.  Hgb A1C to be monitored as already arranged by primary service   5. Hypercholesterolemia Continue statin as ordered and reviewed, no changes at this time     Hortencia Pilar, MD  06/03/2021 11:32 AM

## 2021-06-06 ENCOUNTER — Ambulatory Visit (INDEPENDENT_AMBULATORY_CARE_PROVIDER_SITE_OTHER): Payer: Medicare Other | Admitting: Vascular Surgery

## 2021-06-06 ENCOUNTER — Encounter (INDEPENDENT_AMBULATORY_CARE_PROVIDER_SITE_OTHER): Payer: Self-pay | Admitting: Vascular Surgery

## 2021-06-06 ENCOUNTER — Other Ambulatory Visit: Payer: Self-pay

## 2021-06-06 VITALS — BP 127/76 | HR 69 | Resp 16 | Wt 223.0 lb

## 2021-06-06 DIAGNOSIS — I70221 Atherosclerosis of native arteries of extremities with rest pain, right leg: Secondary | ICD-10-CM

## 2021-06-06 DIAGNOSIS — I10 Essential (primary) hypertension: Secondary | ICD-10-CM

## 2021-06-06 DIAGNOSIS — E1159 Type 2 diabetes mellitus with other circulatory complications: Secondary | ICD-10-CM

## 2021-06-06 DIAGNOSIS — K2100 Gastro-esophageal reflux disease with esophagitis, without bleeding: Secondary | ICD-10-CM

## 2021-06-06 DIAGNOSIS — E7800 Pure hypercholesterolemia, unspecified: Secondary | ICD-10-CM

## 2021-06-07 ENCOUNTER — Encounter (INDEPENDENT_AMBULATORY_CARE_PROVIDER_SITE_OTHER): Payer: Self-pay | Admitting: Vascular Surgery

## 2021-06-13 ENCOUNTER — Other Ambulatory Visit: Payer: Self-pay | Admitting: Internal Medicine

## 2021-06-18 ENCOUNTER — Telehealth: Payer: Self-pay | Admitting: Internal Medicine

## 2021-06-18 NOTE — Telephone Encounter (Signed)
Pt want an update on her knee surgery. Pt request to be called back

## 2021-06-19 NOTE — Telephone Encounter (Signed)
I can work her in next week .

## 2021-06-19 NOTE — Telephone Encounter (Signed)
Pt scheduled  

## 2021-06-19 NOTE — Telephone Encounter (Signed)
Called patient to clarify. She was scheduled for knee surgery in Nov. Was canceled for further w/up on her legs. Per patient, Dr Delana Meyer has cleared her for surgery but Dr Maree Krabbe office says they have not received anything regarding surgery clearance. Patient says that Dr Delana Meyer told her that it would be in her chart. Do we need to have her come in for another pre-op?

## 2021-06-26 ENCOUNTER — Ambulatory Visit (INDEPENDENT_AMBULATORY_CARE_PROVIDER_SITE_OTHER): Payer: Medicare Other | Admitting: Internal Medicine

## 2021-06-26 ENCOUNTER — Encounter: Payer: Self-pay | Admitting: Internal Medicine

## 2021-06-26 ENCOUNTER — Other Ambulatory Visit: Payer: Self-pay

## 2021-06-26 VITALS — BP 132/76 | HR 76 | Temp 97.9°F | Resp 16 | Ht 66.0 in | Wt 224.0 lb

## 2021-06-26 DIAGNOSIS — K625 Hemorrhage of anus and rectum: Secondary | ICD-10-CM | POA: Diagnosis not present

## 2021-06-26 DIAGNOSIS — I700 Atherosclerosis of aorta: Secondary | ICD-10-CM

## 2021-06-26 DIAGNOSIS — I272 Pulmonary hypertension, unspecified: Secondary | ICD-10-CM

## 2021-06-26 DIAGNOSIS — E7800 Pure hypercholesterolemia, unspecified: Secondary | ICD-10-CM

## 2021-06-26 DIAGNOSIS — E1159 Type 2 diabetes mellitus with other circulatory complications: Secondary | ICD-10-CM

## 2021-06-26 DIAGNOSIS — M341 CR(E)ST syndrome: Secondary | ICD-10-CM | POA: Diagnosis not present

## 2021-06-26 DIAGNOSIS — D696 Thrombocytopenia, unspecified: Secondary | ICD-10-CM

## 2021-06-26 DIAGNOSIS — M7989 Other specified soft tissue disorders: Secondary | ICD-10-CM

## 2021-06-26 DIAGNOSIS — I10 Essential (primary) hypertension: Secondary | ICD-10-CM | POA: Diagnosis not present

## 2021-06-26 DIAGNOSIS — I70221 Atherosclerosis of native arteries of extremities with rest pain, right leg: Secondary | ICD-10-CM

## 2021-06-26 DIAGNOSIS — M069 Rheumatoid arthritis, unspecified: Secondary | ICD-10-CM

## 2021-06-26 DIAGNOSIS — K2100 Gastro-esophageal reflux disease with esophagitis, without bleeding: Secondary | ICD-10-CM

## 2021-06-26 LAB — CBC WITH DIFFERENTIAL/PLATELET
Basophils Absolute: 0 10*3/uL (ref 0.0–0.1)
Basophils Relative: 1 % (ref 0.0–3.0)
Eosinophils Absolute: 0.1 10*3/uL (ref 0.0–0.7)
Eosinophils Relative: 1.6 % (ref 0.0–5.0)
HCT: 41.3 % (ref 36.0–46.0)
Hemoglobin: 12.8 g/dL (ref 12.0–15.0)
Lymphocytes Relative: 16.8 % (ref 12.0–46.0)
Lymphs Abs: 0.7 10*3/uL (ref 0.7–4.0)
MCHC: 30.9 g/dL (ref 30.0–36.0)
MCV: 106.2 fl — ABNORMAL HIGH (ref 78.0–100.0)
Monocytes Absolute: 0.5 10*3/uL (ref 0.1–1.0)
Monocytes Relative: 11.4 % (ref 3.0–12.0)
Neutro Abs: 2.9 10*3/uL (ref 1.4–7.7)
Neutrophils Relative %: 69.2 % (ref 43.0–77.0)
Platelets: 141 10*3/uL — ABNORMAL LOW (ref 150.0–400.0)
RBC: 3.89 Mil/uL (ref 3.87–5.11)
RDW: 19 % — ABNORMAL HIGH (ref 11.5–15.5)
WBC: 4.2 10*3/uL (ref 4.0–10.5)

## 2021-06-26 LAB — BASIC METABOLIC PANEL
BUN: 23 mg/dL (ref 6–23)
CO2: 26 mEq/L (ref 19–32)
Calcium: 10.1 mg/dL (ref 8.4–10.5)
Chloride: 104 mEq/L (ref 96–112)
Creatinine, Ser: 1.07 mg/dL (ref 0.40–1.20)
GFR: 49.2 mL/min — ABNORMAL LOW (ref >60.00)
Glucose, Bld: 98 mg/dL (ref 70–99)
Potassium: 4.3 mEq/L (ref 3.5–5.1)
Sodium: 140 mEq/L (ref 135–145)

## 2021-06-26 LAB — TSH: TSH: 5.62 u[IU]/mL — ABNORMAL HIGH (ref 0.35–5.50)

## 2021-06-26 NOTE — Progress Notes (Signed)
Patient ID: Nina Perez, female   DOB: Sep 14, 1940, 81 y.o.   MRN: [id number]   Subjective:    Patient ID: Nina Perez, female    DOB: [birth year], 81 y.o.   MRN: [id number]  This visit occurred during the SARS-CoV-2 public health emergency.  Safety protocols were in place, including screening questions prior to the visit, additional usage of staff PPE, and extensive cleaning of exam room while observing appropriate contact time as indicated for disinfecting solutions.   Patient here for work in appt.   Chief Complaint  Patient presents with   Discuss Knee Surgery    Cleared by cardio and vascular   .   HPI Here to discuss knee surgery. Knee surgery had to be postponed recently given cellulitis initially.  Also, need vascular clearance.  Is s/p angiogram - Diagnostic right leg angiogram revealed the femoral popliteal system - widely patent.  The tibioperoneal trunk and peroneal - widely patent with mild atherosclerotic changes.  There is a large collateral from the distal peroneal that reconstitutes the dorsalis pedis and there is filling of the pedal arch. No new ulcers or wounds. She was cleared from a vascular standpoint to proceed with knee replacement surgery.  She also saw cardiology 06/11/21 - note reviewed.  They felt from a cardiac standpoint - risk of surgery low.  She comes in today to discuss her surgery.  No chest pain.  Breathing stable.  No increased cough or congestion.  Eating.  No nausea or vomiting.  No abdominal pain.  Did notice BRBPR - Thursday night/Friday - 06/20/21 and 06/21/21.  Had diarrhea Thursday.  Took Imodium.  That night had multiple bowel movements.  BRBPR.  Noticed blood in commode.  Noticed bleeding intermittently - during day Friday.  No further bleeding noticed since Friday.  Has had a bowel movement since - bm this am.  No further diarrhea.     Past Medical History:  Diagnosis Date   Aortic atherosclerosis (HCC)    Barrett's esophagus    Cardiac murmur    CKD  (chronic kidney disease), stage III (HCC)    Dyspnea on exertion    GERD (gastroesophageal reflux disease)    Gout    History of 2019 novel coronavirus disease (COVID-19) 08/2020   History of colon cancer    adenomatous polyps   Hypercholesterolemia    Hypertension    ILD (interstitial lung disease) ([place])    a.) 2/2 scleroderma Dx   Inflammatory arthritis    LBBB (left bundle branch block)    Long term current use of antithrombotics/antiplatelets    a.) ASA + clopidogrel   Long term current use of immunosuppressive drug    a.) MTX for RA and scleroderma Dx   OSA on CPAP    Osteoarthritis    knees, spine   PAD (peripheral artery disease) (HCC)    Phlebitis    x2 (with pregnancy)   Pulmonary fibrosis (HCC)    mild   Pulmonary hypertension (HCC)    Reflux esophagitis    Rheumatoid arthritis(714.0)    positive RF, FANA, RNP, negative CCP ab and anti DNA,.  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, sclerodactyly, telangiectasias   Skin cancer 2018   Supplemental oxygen dependent    a.) 2L/[place] PRN for DOE related to mild pulmonary fibrosis and pulmonary HTN   T2DM (type 2 diabetes mellitus) ([place])    Past Surgical History:  Procedure Laterality  Date   ABDOMINAL AORTOGRAM W/LOWER EXTREMITY Left 05/16/2019   Procedure: ABDOMINAL AORTOGRAM W/LOWER EXTREMITY;  Surgeon: Lorretta Harp, MD;  Location: [facility];  Service: Cardiovascular;  Laterality: Left;   AMPUTATION TOE Left 06/02/2019   Procedure: AMPUTATION TOE;  Surgeon: Albertine Patricia, DPM;  Location: [facility];  Service: Podiatry;  Laterality: Left;   APPENDECTOMY  1972   BREAST CYST ASPIRATION Left    neg   INCISION AND DRAINAGE OF WOUND Left 06/07/2019   Procedure: IRRIGATION AND DEBRIDEMENT WOUND;  Surgeon: Albertine Patricia, DPM;  Location: [facility];  Service: Podiatry;  Laterality: Left;   LOWER EXTREMITY ANGIOGRAPHY Left 06/03/2019   Procedure: Lower Extremity  Angiography;  Surgeon: Katha Cabal, MD;  Location: [facility];  Service: Cardiovascular;  Laterality: Left;   LOWER EXTREMITY ANGIOGRAPHY Right 05/21/2021   Procedure: LOWER EXTREMITY ANGIOGRAPHY;  Surgeon: Katha Cabal, MD;  Location: [facility];  Service: Cardiovascular;  Laterality: Right;   PERIPHERAL VASCULAR BALLOON ANGIOPLASTY Left 05/16/2019   Procedure: PERIPHERAL VASCULAR BALLOON ANGIOPLASTY;  Surgeon: Lorretta Harp, MD;  Location: [facility];  Service: Cardiovascular;  Laterality: Left;  SFA   TUBAL LIGATION  1972   VEIN LIGATION AND STRIPPING     Family History  Problem Relation Age of Onset   Arthritis Mother    Heart attack Father    Colon cancer Neg Hx    Breast cancer Neg Hx    Social History   Socioeconomic History   Marital status: Married    Spouse name: [name]   Number of children: 2   Years of education: Not on file   Highest education level: Not on file  Occupational History   Not on file  Tobacco Use   Smoking status: Former    Packs/day: 0.50    Years: 15.00    Pack years: 7.50    Types: Cigarettes    Quit date: 05/12/1988    Years since quitting: 33.1   Smokeless tobacco: Never  Vaping Use   Vaping Use: Never used  Substance and Sexual Activity   Alcohol use: No    Alcohol/week: 0.0 standard drinks   Drug use: No   Sexual activity: Yes  Other Topics Concern   Not on file  Social History Narrative   Lives at home with spouse [name]    Social Determinants of Health   Financial Resource Strain: Low Risk    Difficulty of Paying Living Expenses: Not hard at all  Food Insecurity: No Food Insecurity   Worried About Charity fundraiser in the Last Year: Never true   Ran Out of Food in the Last Year: Never true  Transportation Needs: No Transportation Needs   Lack of Transportation (Medical): No   Lack of Transportation (Non-Medical): No  Physical Activity: Not on file  Stress: No Stress Concern Present    Feeling of Stress : Not at all  Social Connections: Socially Integrated   Frequency of Communication with Friends and Family: More than three times a week   Frequency of Social Gatherings with Friends and Family: Not on file   Attends Religious Services: More than 4 times per year   Active Member of [employer] or Organizations: Yes   Attends Music therapist: More than 4 times per year   Marital Status: Married     Review of Systems  Constitutional:  Negative for appetite change and unexpected weight change.  HENT:  Negative for  congestion and sinus pressure.   Respiratory:  Negative for cough and chest tightness.        Breathing stable.   Cardiovascular:  Negative for chest pain and palpitations.       No increased swelling.  Stable.   Gastrointestinal:  Negative for abdominal pain, diarrhea, nausea and vomiting.  Genitourinary:  Negative for difficulty urinating and dysuria.  Musculoskeletal:  Negative for joint swelling and myalgias.  Skin:  Negative for color change and rash.       Chronic stasis changes.  No acute infection   Neurological:  Negative for dizziness, light-headedness and headaches.  Psychiatric/Behavioral:  Negative for agitation and dysphoric mood.       Objective:     BP 132/76    Pulse 76    Temp 97.9 F (36.6 C)    Resp 16    Ht '5\' 6"'  (1.676 m)    Wt 224 lb (101.6 kg)    LMP 05/25/1988    SpO2 96%    BMI 36.15 kg/m  Wt Readings from Last 3 Encounters:  06/26/21 224 lb (101.6 kg)  06/06/21 223 lb (101.2 kg)  05/21/21 225 lb 12 oz (102.4 kg)    Physical Exam Vitals reviewed.  Constitutional:      General: She is not in acute distress.    Appearance: Normal appearance.  HENT:     Head: Normocephalic and atraumatic.     Right Ear: External ear normal.     Left Ear: External ear normal.  Eyes:     General: No scleral icterus.       Right eye: No discharge.        Left eye: No discharge.     Conjunctiva/sclera: Conjunctivae normal.   Neck:     Thyroid: No thyromegaly.  Cardiovascular:     Rate and Rhythm: Normal rate and regular rhythm.  Pulmonary:     Effort: No respiratory distress.     Breath sounds: Normal breath sounds. No wheezing.  Abdominal:     General: Bowel sounds are normal.     Palpations: Abdomen is soft.     Tenderness: There is no abdominal tenderness.  Musculoskeletal:        General: No swelling or tenderness.     Cervical back: Neck supple. No tenderness.  Lymphadenopathy:     Cervical: No cervical adenopathy.  Skin:    Findings: No erythema or rash.  Neurological:     Mental Status: She is alert.  Psychiatric:        Mood and Affect: Mood normal.        Behavior: Behavior normal.     Outpatient Encounter Medications as of 06/26/2021  Medication Sig   acetaminophen (TYLENOL) 650 MG CR tablet Take 650-1,300 mg by mouth every 8 (eight) hours as needed for pain.   allopurinol (ZYLOPRIM) 300 MG tablet Take 1 tablet by mouth once daily   amLODipine (NORVASC) 10 MG tablet TAKE 1 TABLET BY MOUTH DAILY   aspirin 81 MG tablet Take 81 mg by mouth daily. Reported on 07/24/2015   atorvastatin (LIPITOR) 40 MG tablet Take 1 tablet by mouth once daily   clopidogrel (PLAVIX) 75 MG tablet Take 1 tablet by mouth once daily with breakfast   fexofenadine (ALLEGRA) 180 MG tablet Take 1 tablet (180 mg total) by mouth daily. (Patient taking differently: Take 180 mg by mouth daily as needed for allergies.)   folic acid (FOLVITE) 1 MG tablet Take 1 mg by mouth every  evening.    glucose blood (BAYER CONTOUR TEST) test strip USE  STRIP TO CHECK GLUCOSE TWICE DAILY. Dx: E11.9   ipratropium (ATROVENT) 0.06 % nasal spray Place 2 sprays into both nostrils 4 (four) times daily. (Patient taking differently: Place 2 sprays into both nostrils 4 (four) times daily as needed for rhinitis.)   losartan (COZAAR) 100 MG tablet Take 1 tablet by mouth once daily   metFORMIN (GLUCOPHAGE) 500 MG tablet Take 1 tablet by mouth once  daily   methotrexate (RHEUMATREX) 2.5 MG tablet Take 10 mg by mouth every Wednesday.   metoprolol succinate (TOPROL-XL) 50 MG 24 hr tablet TAKE 1 TABLET BY MOUTH ONCE DAILY IMMEDIATELY FOLLOWING A MEAL   MICROLET LANCETS MISC Check sugar once daily, [employer] Microlet Lancets. Dx E11.9   pantoprazole (PROTONIX) 40 MG tablet Take 1 tablet by mouth once daily   triamcinolone cream (KENALOG) 0.1 % Apply 1 application topically 2 (two) times daily. To lower legs before putting on and/or after removing compression hose for redness in legs   trospium (SANCTURA) 20 MG tablet Take 1 tablet (20 mg total) by mouth at bedtime.   No facility-administered encounter medications on file as of 06/26/2021.     Lab Results  Component Value Date   WBC 4.2 06/26/2021   HGB 12.8 06/26/2021   HCT 41.3 06/26/2021   PLT 141.0 (L) 06/26/2021   GLUCOSE 98 06/26/2021   CHOL 100 05/15/2021   TRIG 120.0 05/15/2021   HDL 34.30 (L) 05/15/2021   LDLCALC 42 05/15/2021   ALT 14 05/15/2021   AST 19 05/15/2021   NA 140 06/26/2021   K 4.3 06/26/2021   CL 104 06/26/2021   CREATININE 1.07 06/26/2021   BUN 23 06/26/2021   CO2 26 06/26/2021   TSH 5.62 (H) 06/26/2021   INR 1.1 03/25/2021   HGBA1C 6.0 05/15/2021   MICROALBUR 2.7 (H) 09/28/2020    PERIPHERAL VASCULAR CATHETERIZATION  Result Date: 05/21/2021 See surgical note for result.      Assessment & Plan:   Problem List Items Addressed This Visit     Aortic atherosclerosis ([place])    Continue lipitor.       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  Continue lipitor and plavix.  Recent angiogram as outlined.  Has been cleared from vascular to proceed with knee surgery.       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       Diabetes mellitus ([place])    On metformin.  Low carb diet and exercise.  Follow met b and a1c.  Will hold metformin as previously  discussed - around procedure.       GERD (gastroesophageal reflux disease)    No upper symptoms reported.  Continue protonix.       Hypercholesterolemia    Continue lipitor.  Low cholesterol diet and exercise.  Follow lipid panel and liver function tests.        Hypertension - Primary    Continue metoprolol, losartan and amlodipine.  Follow pressures.  Follow metabolic panel.       Relevant Orders   Basic Metabolic Panel (BMET) (Completed)   TSH (Completed)   Pulmonary hypertension, mild ([place])    Continue f/u with pulmonary.  Treat sleep apnea.        Rectal bleeding    Recent rectal bleeding as outlined.  Stopped now.  No bleeding over  the last few days.  Heme negative on exam.  IFOB given.  Check hemoccult cards.  Plan f/u with GI to confirm no further w/up prior to surgery.  Continue plavix. Follow.       Relevant Orders   CBC with Differential/Platelet (Completed)   Fecal occult blood, imunochemical   Rheumatoid arthritis ([place])    On MTX.  Followed by Dr Jefm Bryant.       Swelling of lower extremity    Appears improved.  Continue f/u with vascular surgery.       Thrombocytopenia ([place])    Recheck cbc today.  Recent bleeding.         Einar Pheasant, MD

## 2021-06-27 ENCOUNTER — Encounter: Payer: Self-pay | Admitting: Internal Medicine

## 2021-06-27 ENCOUNTER — Other Ambulatory Visit: Payer: Self-pay | Admitting: Internal Medicine

## 2021-06-27 NOTE — Assessment & Plan Note (Signed)
S/p angiogram, angioplasty and toe amputation.  Continue lipitor and plavix.  Recent angiogram as outlined.  Has been cleared from vascular to proceed with knee surgery.

## 2021-06-27 NOTE — Assessment & Plan Note (Addendum)
On metformin.  Low carb diet and exercise.  Follow met b and a1c.  Will hold metformin as previously discussed - around procedure.

## 2021-06-27 NOTE — Assessment & Plan Note (Signed)
Followed by Dr Kernodle.   

## 2021-06-27 NOTE — Assessment & Plan Note (Signed)
Continue f/u with pulmonary.  Treat sleep apnea.

## 2021-06-27 NOTE — Assessment & Plan Note (Signed)
Continue lipitor  ?

## 2021-06-27 NOTE — Assessment & Plan Note (Signed)
Continue metoprolol, losartan and amlodipine.  Follow pressures.  Follow metabolic panel.  

## 2021-06-27 NOTE — Assessment & Plan Note (Signed)
Recheck cbc today.  Recent bleeding.

## 2021-06-27 NOTE — Assessment & Plan Note (Signed)
Recent rectal bleeding as outlined.  Stopped now.  No bleeding over the last few days.  Heme negative on exam.  IFOB given.  Check hemoccult cards.  Plan f/u with GI to confirm no further w/up prior to surgery.  Continue plavix. Follow.

## 2021-06-27 NOTE — Assessment & Plan Note (Signed)
No upper symptoms reported.  Continue protonix.  

## 2021-06-27 NOTE — Assessment & Plan Note (Signed)
Appears improved.  Continue f/u with vascular surgery.

## 2021-06-27 NOTE — Assessment & Plan Note (Signed)
On MTX.  Followed by Dr Jefm Bryant.

## 2021-06-27 NOTE — Assessment & Plan Note (Signed)
Continue lipitor.  Low cholesterol diet and exercise.  Follow lipid panel and liver function tests.   

## 2021-06-28 ENCOUNTER — Telehealth: Payer: Self-pay

## 2021-06-28 ENCOUNTER — Other Ambulatory Visit (INDEPENDENT_AMBULATORY_CARE_PROVIDER_SITE_OTHER): Payer: Medicare Other

## 2021-06-28 DIAGNOSIS — K625 Hemorrhage of anus and rectum: Secondary | ICD-10-CM | POA: Diagnosis not present

## 2021-06-28 LAB — FECAL OCCULT BLOOD, IMMUNOCHEMICAL: Fecal Occult Bld: POSITIVE — AB

## 2021-06-28 NOTE — Telephone Encounter (Signed)
CRITICAL VALUE STICKER  CRITICAL VALUE: + IFOB  RECEIVER (on-site recipient of call): [facility] CMA  DATE & TIME NOTIFIED: 06/28/2021 2:49pm  MESSENGER (representative from lab): Earnest Bailey  MD NOTIFIED:  Dr Nicki Reaper  TIME OF NOTIFICATION: 2:49pm  RESPONSE:

## 2021-06-29 NOTE — Telephone Encounter (Signed)
Have discussed with pt and left GI a message regarding need for further evaluation.

## 2021-07-02 ENCOUNTER — Other Ambulatory Visit: Payer: Self-pay

## 2021-07-02 ENCOUNTER — Telehealth: Payer: Self-pay

## 2021-07-02 DIAGNOSIS — I10 Essential (primary) hypertension: Secondary | ICD-10-CM

## 2021-07-02 NOTE — Telephone Encounter (Signed)
LM regarding below

## 2021-07-02 NOTE — Telephone Encounter (Signed)
Pt returning call. Pt requesting callback.  °

## 2021-07-02 NOTE — Telephone Encounter (Signed)
-----  Message from Einar Pheasant, MD sent at 07/01/2021  8:38 PM EST ----- Please notify her to let pulmonary know of her concerns with the torsemide.  Also, have her monitor for an acid reflux off protonix.  Also, monitor blood pressure.  Keep [place] posted and if she remains on torsemide,will need a f/u met b in the next couple of weeks.

## 2021-07-02 NOTE — Telephone Encounter (Signed)
Pt aware of below.

## 2021-07-04 DIAGNOSIS — R195 Other fecal abnormalities: Secondary | ICD-10-CM | POA: Insufficient documentation

## 2021-07-17 ENCOUNTER — Other Ambulatory Visit (INDEPENDENT_AMBULATORY_CARE_PROVIDER_SITE_OTHER): Payer: Medicare Other

## 2021-07-17 ENCOUNTER — Other Ambulatory Visit: Payer: Self-pay

## 2021-07-17 DIAGNOSIS — E1159 Type 2 diabetes mellitus with other circulatory complications: Secondary | ICD-10-CM | POA: Diagnosis not present

## 2021-07-17 DIAGNOSIS — E7800 Pure hypercholesterolemia, unspecified: Secondary | ICD-10-CM | POA: Diagnosis not present

## 2021-07-17 DIAGNOSIS — I10 Essential (primary) hypertension: Secondary | ICD-10-CM

## 2021-07-17 LAB — LIPID PANEL
Cholesterol: 94 mg/dL (ref 0–200)
HDL: 35.2 mg/dL — ABNORMAL LOW (ref >39.00)
LDL Cholesterol: 28 mg/dL (ref 0–99)
NonHDL: 58.82
Total CHOL/HDL Ratio: 3
Triglycerides: 155 mg/dL — ABNORMAL HIGH (ref 0.0–149.0)
VLDL: 31 mg/dL (ref 0.0–40.0)

## 2021-07-17 LAB — CBC WITH DIFFERENTIAL/PLATELET
Basophils Absolute: 0.1 10*3/uL (ref 0.0–0.1)
Basophils Relative: 1.3 % (ref 0.0–3.0)
Eosinophils Absolute: 0 10*3/uL (ref 0.0–0.7)
Eosinophils Relative: 0.9 % (ref 0.0–5.0)
HCT: 38.7 % (ref 36.0–46.0)
Hemoglobin: 12.4 g/dL (ref 12.0–15.0)
Lymphocytes Relative: 15.2 % (ref 12.0–46.0)
Lymphs Abs: 0.7 10*3/uL (ref 0.7–4.0)
MCHC: 32 g/dL (ref 30.0–36.0)
MCV: 105.7 fl — ABNORMAL HIGH (ref 78.0–100.0)
Monocytes Absolute: 0.5 10*3/uL (ref 0.1–1.0)
Monocytes Relative: 11.7 % (ref 3.0–12.0)
Neutro Abs: 3.2 10*3/uL (ref 1.4–7.7)
Neutrophils Relative %: 70.9 % (ref 43.0–77.0)
Platelets: 123 10*3/uL — ABNORMAL LOW (ref 150.0–400.0)
RBC: 3.67 Mil/uL — ABNORMAL LOW (ref 3.87–5.11)
RDW: 19.5 % — ABNORMAL HIGH (ref 11.5–15.5)
WBC: 4.5 10*3/uL (ref 4.0–10.5)

## 2021-07-17 LAB — HEPATIC FUNCTION PANEL
ALT: 12 U/L (ref 0–35)
AST: 19 U/L (ref 0–37)
Albumin: 3.8 g/dL (ref 3.5–5.2)
Alkaline Phosphatase: 92 U/L (ref 39–117)
Bilirubin, Direct: 0.1 mg/dL (ref 0.0–0.3)
Total Bilirubin: 0.6 mg/dL (ref 0.2–1.2)
Total Protein: 6.5 g/dL (ref 6.0–8.3)

## 2021-07-17 LAB — BASIC METABOLIC PANEL
BUN: 22 mg/dL (ref 6–23)
CO2: 28 mEq/L (ref 19–32)
Calcium: 9.6 mg/dL (ref 8.4–10.5)
Chloride: 106 mEq/L (ref 96–112)
Creatinine, Ser: 0.99 mg/dL (ref 0.40–1.20)
GFR: 53.99 mL/min — ABNORMAL LOW (ref >60.00)
Glucose, Bld: 88 mg/dL (ref 70–99)
Potassium: 4.1 mEq/L (ref 3.5–5.1)
Sodium: 141 mEq/L (ref 135–145)

## 2021-07-17 LAB — HEMOGLOBIN A1C: Hgb A1c MFr Bld: 6.1 % (ref 4.6–6.5)

## 2021-07-18 ENCOUNTER — Other Ambulatory Visit: Payer: Self-pay | Admitting: Internal Medicine

## 2021-07-18 ENCOUNTER — Other Ambulatory Visit (INDEPENDENT_AMBULATORY_CARE_PROVIDER_SITE_OTHER): Payer: Medicare Other

## 2021-07-18 DIAGNOSIS — D7589 Other specified diseases of blood and blood-forming organs: Secondary | ICD-10-CM

## 2021-07-18 LAB — B12 AND FOLATE PANEL
Folate: 21.9 ng/mL (ref >5.9)
Vitamin B-12: 526 pg/mL (ref 211–911)

## 2021-07-18 NOTE — Progress Notes (Signed)
Order placed for add on lab.  °

## 2021-07-25 ENCOUNTER — Other Ambulatory Visit: Payer: Self-pay | Admitting: Internal Medicine

## 2021-08-09 ENCOUNTER — Encounter: Payer: Self-pay | Admitting: *Deleted

## 2021-08-09 ENCOUNTER — Other Ambulatory Visit: Payer: Self-pay | Admitting: *Deleted

## 2021-08-09 DIAGNOSIS — D696 Thrombocytopenia, unspecified: Secondary | ICD-10-CM

## 2021-08-12 ENCOUNTER — Other Ambulatory Visit (INDEPENDENT_AMBULATORY_CARE_PROVIDER_SITE_OTHER): Payer: Medicare Other

## 2021-08-12 DIAGNOSIS — D696 Thrombocytopenia, unspecified: Secondary | ICD-10-CM

## 2021-08-12 LAB — CBC WITH DIFFERENTIAL/PLATELET
Basophils Absolute: 0 10*3/uL (ref 0.0–0.1)
Basophils Relative: 0.8 % (ref 0.0–3.0)
Eosinophils Absolute: 0 10*3/uL (ref 0.0–0.7)
Eosinophils Relative: 1 % (ref 0.0–5.0)
HCT: 38.3 % (ref 36.0–46.0)
Hemoglobin: 12.3 g/dL (ref 12.0–15.0)
Lymphocytes Relative: 13.9 % (ref 12.0–46.0)
Lymphs Abs: 0.6 10*3/uL — ABNORMAL LOW (ref 0.7–4.0)
MCHC: 32 g/dL (ref 30.0–36.0)
MCV: 102.8 fl — ABNORMAL HIGH (ref 78.0–100.0)
Monocytes Absolute: 0.3 10*3/uL (ref 0.1–1.0)
Monocytes Relative: 6 % (ref 3.0–12.0)
Neutro Abs: 3.3 10*3/uL (ref 1.4–7.7)
Neutrophils Relative %: 78.3 % — ABNORMAL HIGH (ref 43.0–77.0)
Platelets: 127 10*3/uL — ABNORMAL LOW (ref 150.0–400.0)
RBC: 3.72 Mil/uL — ABNORMAL LOW (ref 3.87–5.11)
RDW: 19.8 % — ABNORMAL HIGH (ref 11.5–15.5)
WBC: 4.3 10*3/uL (ref 4.0–10.5)

## 2021-08-20 ENCOUNTER — Encounter: Payer: Self-pay | Admitting: Internal Medicine

## 2021-08-20 ENCOUNTER — Ambulatory Visit (INDEPENDENT_AMBULATORY_CARE_PROVIDER_SITE_OTHER): Payer: Medicare Other | Admitting: Internal Medicine

## 2021-08-20 DIAGNOSIS — I70221 Atherosclerosis of native arteries of extremities with rest pain, right leg: Secondary | ICD-10-CM | POA: Diagnosis not present

## 2021-08-20 DIAGNOSIS — M341 CR(E)ST syndrome: Secondary | ICD-10-CM | POA: Diagnosis not present

## 2021-08-20 DIAGNOSIS — M25569 Pain in unspecified knee: Secondary | ICD-10-CM

## 2021-08-20 DIAGNOSIS — I10 Essential (primary) hypertension: Secondary | ICD-10-CM

## 2021-08-20 DIAGNOSIS — I700 Atherosclerosis of aorta: Secondary | ICD-10-CM

## 2021-08-20 DIAGNOSIS — K2100 Gastro-esophageal reflux disease with esophagitis, without bleeding: Secondary | ICD-10-CM

## 2021-08-20 DIAGNOSIS — Z89422 Acquired absence of other left toe(s): Secondary | ICD-10-CM

## 2021-08-20 DIAGNOSIS — N1831 Chronic kidney disease, stage 3a: Secondary | ICD-10-CM

## 2021-08-20 DIAGNOSIS — D1771 Benign lipomatous neoplasm of kidney: Secondary | ICD-10-CM

## 2021-08-20 DIAGNOSIS — M069 Rheumatoid arthritis, unspecified: Secondary | ICD-10-CM

## 2021-08-20 DIAGNOSIS — E7800 Pure hypercholesterolemia, unspecified: Secondary | ICD-10-CM

## 2021-08-20 DIAGNOSIS — D696 Thrombocytopenia, unspecified: Secondary | ICD-10-CM

## 2021-08-20 DIAGNOSIS — I272 Pulmonary hypertension, unspecified: Secondary | ICD-10-CM

## 2021-08-20 DIAGNOSIS — G8929 Other chronic pain: Secondary | ICD-10-CM

## 2021-08-20 DIAGNOSIS — K2270 Barrett's esophagus without dysplasia: Secondary | ICD-10-CM

## 2021-08-20 DIAGNOSIS — E1159 Type 2 diabetes mellitus with other circulatory complications: Secondary | ICD-10-CM

## 2021-08-20 NOTE — Progress Notes (Signed)
Patient ID: Nina Perez, female   DOB: 1941/05/02, 81 y.o.   MRN: [id number] ? ? ?Subjective:  ? ? Patient ID: Nina Perez, female    DOB: Feb 06, 1941, 81 y.o.   MRN: [id number] ? ?This visit occurred during the SARS-CoV-2 public health emergency.  Safety protocols were in place, including screening questions prior to the visit, additional usage of staff PPE, and extensive cleaning of exam room while observing appropriate contact time as indicated for disinfecting solutions.  ? ?Patient here for a scheduled follow up.  ? ?Chief Complaint  ?Patient presents with  ? Follow-up  ?  Follow up - pt wishes to discuss options for patient getting a knee replacement. Stated pain is increasing, and mobility is decreasing. Pt states still taking Torsemide, but only 1/2 tablet and very infrequently due to weakness.  ? .  ? ?HPI ?She is accompanied by her husband.  History obtained from both of them.  Previously planned knee surgery.  Was postpone initially due to cellulitis.  Also needed vascular clearance.  Is s/p angiogram - Diagnostic right leg angiogram revealed the femoral popliteal system - widely patent.  The tibioperoneal trunk and peroneal - widely patent with mild atherosclerotic changes.  There is a large collateral from the distal peroneal that reconstitutes the dorsalis pedis and there is filling of the pedal arch. No new ulcers or wounds. She was cleared from a vascular standpoint to proceed with knee replacement surgery.  She also saw cardiology 06/11/21 - note reviewed.  They felt from a cardiac standpoint - risk of surgery low. Was reevaluated for medical clearance 06/26/21 and reported BRBPR.  Was referred to GI for further evaluation and surgery postponed.  Continues on plavix.  No further bleeding.  Is planning for EGD/colonoscopy 09/2021.  Continues on protonix.  Having increased knee pain.  Limited mobility.  Pain and mobility - worsening.  Discussed surgery.  Knee affecting quality of life.  She denies any chest  pain or increased sob.  Feels breathing is stable.  No increased cough or congestion.  No acid reflux.  No abdominal pain or bowel change.  Lower extremity swelling is better.  No increased redness.  Not taking torsemide on a regular basis.   ? ? ?Past Medical History:  ?Diagnosis Date  ? Aortic atherosclerosis ([place])   ? Barrett's esophagus   ? Cardiac murmur   ? CKD (chronic kidney disease), stage III ([place])   ? Dyspnea on exertion   ? GERD (gastroesophageal reflux disease)   ? Gout   ? History of 2019 novel coronavirus disease (COVID-19) 08/2020  ? History of colon cancer   ? adenomatous polyps  ? Hypercholesterolemia   ? Hypertension   ? ILD (interstitial lung disease) ([place])   ? a.) 2/2 scleroderma Dx  ? Inflammatory arthritis   ? LBBB (left bundle branch block)   ? Long term current use of antithrombotics/antiplatelets   ? a.) ASA + clopidogrel  ? Long term current use of immunosuppressive drug   ? a.) MTX for RA and scleroderma Dx  ? OSA on CPAP   ? Osteoarthritis   ? knees, spine  ? PAD (peripheral artery disease) ([place])   ? Phlebitis   ? x2 (with pregnancy)  ? Pulmonary fibrosis ([place])   ? mild  ? Pulmonary hypertension ([place])   ? Reflux esophagitis   ? Rheumatoid arthritis(714.0)   ? positive RF, FANA, RNP, negative CCP ab and anti DNA,.  neg anti-SCL 70  ? Scleroderma (  [place])   ? 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 Skin cancer 2018  ? Supplemental oxygen dependent   ? a.) 2L/ PRN for DOE related to mild pulmonary fibrosis and pulmonary HTN  ? T2DM (type 2 diabetes mellitus) ([place])   ? ?Past Surgical History:  ?Procedure Laterality Date  ? ABDOMINAL AORTOGRAM W/LOWER EXTREMITY Left 05/16/2019  ? Procedure: ABDOMINAL AORTOGRAM W/LOWER EXTREMITY;  Surgeon: Lorretta Harp, MD;  Location: [facility];  Service: Cardiovascular;  Laterality: Left;  ? AMPUTATION TOE Left 06/02/2019  ? Procedure: AMPUTATION TOE;  Surgeon: Albertine Patricia, DPM;  Location: [facility];   Service: Podiatry;  Laterality: Left;  ? APPENDECTOMY  1972  ? BREAST CYST ASPIRATION Left   ? neg  ? INCISION AND DRAINAGE OF WOUND Left 06/07/2019  ? Procedure: IRRIGATION AND DEBRIDEMENT WOUND;  Surgeon: Albertine Patricia, DPM;  Location: [facility];  Service: Podiatry;  Laterality: Left;  ? LOWER EXTREMITY ANGIOGRAPHY Left 06/03/2019  ? Procedure: Lower Extremity Angiography;  Surgeon: Katha Cabal, MD;  Location: [facility];  Service: Cardiovascular;  Laterality: Left;  ? LOWER EXTREMITY ANGIOGRAPHY Right 05/21/2021  ? Procedure: LOWER EXTREMITY ANGIOGRAPHY;  Surgeon: Katha Cabal, MD;  Location: [facility];  Service: Cardiovascular;  Laterality: Right;  ? PERIPHERAL VASCULAR BALLOON ANGIOPLASTY Left 05/16/2019  ? Procedure: PERIPHERAL VASCULAR BALLOON ANGIOPLASTY;  Surgeon: Lorretta Harp, MD;  Location: [facility];  Service: Cardiovascular;  Laterality: Left;  SFA  ? TUBAL LIGATION  1972  ? VEIN LIGATION AND STRIPPING    ? ?Family History  ?Problem Relation Age of Onset  ? Arthritis Mother   ? Heart attack Father   ? Colon cancer Neg Hx   ? Breast cancer Neg Hx   ? ?Social History  ? ?Socioeconomic History  ? Marital status: Married  ?  Spouse name: Edd Arbour  ? Number of children: 2  ? Years of education: Not on file  ? Highest education level: Not on file  ?Occupational History  ? Not on file  ?Tobacco Use  ? Smoking status: Former  ?  Packs/day: 0.50  ?  Years: 15.00  ?  Pack years: 7.50  ?  Types: Cigarettes  ?  Quit date: 05/12/1988  ?  Years since quitting: 33.2  ? Smokeless tobacco: Never  ?Vaping Use  ? Vaping Use: Never used  ?Substance and Sexual Activity  ? Alcohol use: No  ?  Alcohol/week: 0.0 standard drinks  ? Drug use: No  ? Sexual activity: Yes  ?Other Topics Concern  ? Not on file  ?Social History Narrative  ? Lives at home with spouse Edd Arbour   ? ?Social Determinants of Health  ? ?Financial Resource Strain: Low Risk   ? Difficulty of Paying Living Expenses:  Not hard at all  ?Food Insecurity: No Food Insecurity  ? Worried About Charity fundraiser in the Last Year: Never true  ? Ran Out of Food in the Last Year: Never true  ?Transportation Needs: No Transportation Needs  ? Lack of Transportation (Medical): No  ? Lack of Transportation (Non-Medical): No  ?Physical Activity: Not on file  ?Stress: No Stress Concern Present  ? Feeling of Stress : Not at all  ?Social Connections: Socially Integrated  ? Frequency of Communication with Friends and Family: More than three times a week  ? Frequency of Social Gatherings with Friends and Family: Not on file  ? Attends Religious Services:  More than 4 times per year  ? Active Member of Clubs or Organizations: Yes  ? Attends Archivist Meetings: More than 4 times per year  ? Marital Status: Married  ? ? ? ?Review of Systems  ?Constitutional:  Negative for appetite change and unexpected weight change.  ?HENT:  Negative for congestion and sinus pressure.   ?Respiratory:  Negative for cough and chest tightness.   ?     Breathing stable.   ?Cardiovascular:  Negative for chest pain and palpitations.  ?     Leg swelling improved.   ?Gastrointestinal:  Negative for abdominal pain, diarrhea, nausea and vomiting.  ?Genitourinary:  Negative for difficulty urinating and dysuria.  ?Musculoskeletal:  Negative for myalgias.  ?     Increased knee pain as outlined.   ?Skin:  Negative for color change and rash.  ?Neurological:  Negative for dizziness, light-headedness and headaches.  ?Psychiatric/Behavioral:  Negative for agitation and dysphoric mood.   ? ?   ?Objective:  ?  ? ?BP 138/70 (BP Location: Left Arm, Patient Position: Sitting, Cuff Size: Large)   Pulse 64   Temp 98 ?F (36.7 ?C) (Temporal)   Resp 15   Ht '5\' 6"'$  (1.676 m)   Wt 224 lb (101.6 kg)   LMP 05/25/1988   SpO2 96%   BMI 36.15 kg/m?  ?Wt Readings from Last 3 Encounters:  ?08/20/21 224 lb (101.6 kg)  ?06/26/21 224 lb (101.6 kg)  ?06/06/21 223 lb (101.2 kg)   ? ? ?Physical Exam ?Vitals reviewed.  ?Constitutional:   ?   General: She is not in acute distress. ?   Appearance: Normal appearance.  ?HENT:  ?   Head: Normocephalic and atraumatic.  ?   Right Ear: External ear normal.  ?

## 2021-08-21 ENCOUNTER — Encounter: Payer: Self-pay | Admitting: Internal Medicine

## 2021-08-21 DIAGNOSIS — M25569 Pain in unspecified knee: Secondary | ICD-10-CM | POA: Insufficient documentation

## 2021-08-21 NOTE — Assessment & Plan Note (Signed)
Continue f/u with pulmonary.  Treat sleep apnea.   ?

## 2021-08-21 NOTE — Assessment & Plan Note (Signed)
Continue lipitor.  Low cholesterol diet and exercise.  Follow lipid panel and liver function tests.   

## 2021-08-21 NOTE — Assessment & Plan Note (Signed)
On MTX.  Followed by rheumatology. Will need rheumatology recommendation prior to surgery.  ?

## 2021-08-21 NOTE — Assessment & Plan Note (Signed)
Continue metoprolol, losartan and amlodipine.  Follow pressures.  Follow metabolic panel.  

## 2021-08-21 NOTE — Assessment & Plan Note (Addendum)
On metformin.  Low carb diet and exercise.  Follow met b and a1c.  Will need to hold metformin as previously discussed - around procedure.  ?

## 2021-08-21 NOTE — Assessment & Plan Note (Signed)
Platelet count has been stable 120s.  Will need close monitoring in the peri op and post op period.   ?

## 2021-08-21 NOTE — Assessment & Plan Note (Signed)
Recent GFR improved.  Avoid antiinflammatories.  Stay hydrated.  Follow metabolic panel.  ?

## 2021-08-21 NOTE — Assessment & Plan Note (Addendum)
Recently evaluated by GI.  Recommended continuing protonix daily. Planning EGD in May.   ?

## 2021-08-21 NOTE — Assessment & Plan Note (Signed)
S/p amputation.  Followed by [facility].  

## 2021-08-21 NOTE — Assessment & Plan Note (Signed)
saw Dr Stoioff 12/17/20.  MRI - left renal angiomyolipoma.  Recommended f/u renal ultrasound in one year.  

## 2021-08-21 NOTE — Assessment & Plan Note (Signed)
Followed by Dr Kernodle/rheumatology.  ?

## 2021-08-21 NOTE — Assessment & Plan Note (Signed)
No upper symptoms reported.  Continue protonix.  

## 2021-08-21 NOTE — Assessment & Plan Note (Addendum)
S/p angiogram, angioplasty and toe amputation.  Continue lipitor and plavix.  Recent angiogram as outlined.  Has previously been cleared from vascular to proceed with knee surgery.  ?

## 2021-08-21 NOTE — Assessment & Plan Note (Signed)
Continue lipitor  ?

## 2021-08-21 NOTE — Assessment & Plan Note (Addendum)
Persistent increased knee pain.  Limit activity/mobility.  Has been evaluated by ortho. Needs surgery.  Has been postponed as outlined.  Refer back to ortho for evaluation and plans to reschedule surgery.  Will need pulmonary clearance prior to surgery.  Cardiology had previously cleared.  Will need to confirm no further recommendations.  No further GI bleeding.  D/w ortho.  Will need close intra op monitoring of blood sugar, blood pressure and heart rate to avoid extremes.  ?

## 2021-08-22 ENCOUNTER — Telehealth: Payer: Self-pay

## 2021-08-22 NOTE — Patient Outreach (Signed)
Nina Perez was transferred to [facility] [employer] through the [facility]. She is wanting to get more information.  I am sending a program packet to her. ? ?Arville Care, CBCS, CMAA ?[place] Management Assistant ?[place] [facility] ?[phone]   ?

## 2021-08-23 ENCOUNTER — Other Ambulatory Visit: Payer: Self-pay | Admitting: Internal Medicine

## 2021-08-30 ENCOUNTER — Telehealth: Payer: Self-pay | Admitting: Internal Medicine

## 2021-08-30 NOTE — Telephone Encounter (Signed)
Late entry.  Contacted Dr [employer] office regarding pt proceeding with surgery.  My chart message sent to pt.   ?

## 2021-09-25 ENCOUNTER — Encounter: Payer: Self-pay | Admitting: Gastroenterology

## 2021-09-25 NOTE — H&P (Signed)
Pre-Procedure H&P   Patient ID: Nina Perez is a 81 y.o. female.  Gastroenterology Provider: Annamaria Helling, DO  Referring Provider: Dawson Bills, NP PCP: Einar Pheasant, MD  Date: 09/26/2021  HPI Nina Perez is a 81 y.o. female who presents today for Esophagogastroduodenoscopy and Colonoscopy for Bright red blood per rectum, periumbilical pain, history of Barrett's esophagus. Patient with episode of diarrhea and bright red blood per rectum with periumbilical pain that is since resolved.  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  Since then her stools have returned to normal.  She has no family history of colon cancer or colon polyps. Patient reportedly had an EGD in 2014 with biopsies potentially positive for Barrett's esophagus.  She has since stopped her PPI.  At that time she also noted to have a hiatal hernia.  EGD being performed to be evaluated for Barrett's esophagus.  Denies any dysphagia or odynophagia.  Patient is on Plavix was been held for this procedure for 5 days Most recent lab work hemoglobin 12.6 MCV 105 platelets 232,000 A1c 6.1 creatinine 0.9  Past Medical History:  Diagnosis Date   Aortic atherosclerosis (HCC)    Barrett's esophagus    Cardiac murmur    CKD (chronic kidney disease), stage III (HCC)    Dyspnea on exertion    GERD (gastroesophageal reflux disease)    Gout    History of 2019 novel coronavirus disease (COVID-19) 08/2020   History of colon cancer    adenomatous polyps   Hypercholesterolemia    Hypertension    ILD (interstitial lung disease) ([place])    a.) 2/2 scleroderma Dx   Inflammatory arthritis    LBBB (left bundle branch block)    Long term current use of antithrombotics/antiplatelets    a.) ASA + clopidogrel   Long term current use of immunosuppressive drug    a.) MTX for RA and scleroderma Dx   OSA on CPAP    Osteoarthritis    knees, spine   PAD (peripheral artery disease)  (HCC)    Phlebitis    x2 (with pregnancy)   Pulmonary fibrosis (HCC)    mild   Pulmonary hypertension (HCC)    Reflux esophagitis    Rheumatoid arthritis(714.0)    positive RF, FANA, RNP, negative CCP ab and anti DNA,.  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 L[employer] PRN for DOE related to mild pulmonary fibrosis and pulmonary HTN   T2DM (type 2 diabetes mellitus) ([place])     Past Surgical History:  Procedure Laterality Date   ABDOMINAL AORTOGRAM W/LOWER EXTREMITY Left 05/16/2019   Procedure: ABDOMINAL AORTOGRAM W/LOWER EXTREMITY;  Surgeon: Lorretta Harp, MD;  Location: [facility];  Service: Cardiovascular;  Laterality: Left;   AMPUTATION TOE Left 06/02/2019   Procedure: AMPUTATION TOE;  Surgeon: Albertine Patricia, DPM;  Location: [facility];  Service: Podiatry;  Laterality: Left;   APPENDECTOMY  1972   BREAST CYST ASPIRATION Left    neg   INCISION AND DRAINAGE OF WOUND Left 06/07/2019   Procedure: IRRIGATION AND DEBRIDEMENT WOUND;  Surgeon: Albertine Patricia, DPM;  Location: [facility];  Service: Podiatry;  Laterality: Left;   LOWER EXTREMITY ANGIOGRAPHY Left 06/03/2019   Procedure: Lower Extremity Angiography;  Surgeon: Katha Cabal, MD;  Location: [facility];  Service: Cardiovascular;  Laterality: Left;   LOWER EXTREMITY ANGIOGRAPHY Right 05/21/2021   Procedure: LOWER EXTREMITY ANGIOGRAPHY;  Surgeon: Katha Cabal, MD;  Location: [facility];  Service: Cardiovascular;  Laterality: Right;   PERIPHERAL VASCULAR BALLOON ANGIOPLASTY Left 05/16/2019   Procedure: PERIPHERAL VASCULAR BALLOON ANGIOPLASTY;  Surgeon: Lorretta Harp, MD;  Location: [facility];  Service: Cardiovascular;  Laterality: Left;  SFA   TUBAL LIGATION  1972   VEIN LIGATION AND STRIPPING      Family History No h/o GI disease or  malignancy  Review of Systems  Constitutional:  Negative for activity change, appetite change, chills, diaphoresis, fatigue, fever and unexpected weight change.  HENT:  Negative for trouble swallowing and voice change.   Respiratory:  Negative for shortness of breath and wheezing.   Cardiovascular:  Negative for chest pain, palpitations and leg swelling.  Gastrointestinal:  Negative for abdominal distention, abdominal pain, anal bleeding, blood in stool, constipation, diarrhea, nausea, rectal pain and vomiting.  Musculoskeletal:  Negative for arthralgias and myalgias.  Skin:  Negative for color change and pallor.  Neurological:  Negative for dizziness, syncope and weakness.  Psychiatric/Behavioral:  Negative for confusion.   All other systems reviewed and are negative.   Medications No current facility-administered medications on file prior to encounter.   Current Outpatient Medications on File Prior to Encounter  Medication Sig Dispense Refill   amLODipine (NORVASC) 10 MG tablet TAKE 1 TABLET BY MOUTH DAILY 90 tablet 3   aspirin 81 MG tablet Take 81 mg by mouth daily. Reported on 07/24/2015     methotrexate (RHEUMATREX) 2.5 MG tablet Take 10 mg by mouth every Wednesday.     metoprolol succinate (TOPROL-XL) 50 MG 24 hr tablet TAKE 1 TABLET BY MOUTH ONCE DAILY IMMEDIATELY  FOLLOWING  A  MEAL 90 tablet 0   triamcinolone cream (KENALOG) 0.1 % Apply 1 application topically 2 (two) times daily. To lower legs before putting on and/or after removing compression hose for redness in legs 45 g 0   acetaminophen (TYLENOL) 650 MG CR tablet Take 650-1,300 mg by mouth every 8 (eight) hours as needed for pain.     allopurinol (ZYLOPRIM) 300 MG tablet Take 1 tablet by mouth once daily 90 tablet 3   clopidogrel (PLAVIX) 75 MG tablet Take 1 tablet by mouth once daily with breakfast 90 tablet 0   fexofenadine (ALLEGRA) 180 MG tablet Take 1 tablet (180 mg total) by mouth daily. (Patient taking differently:  Take 180 mg by mouth daily as needed for allergies.) 30 tablet 1   folic acid (FOLVITE) 1 MG tablet Take 1 mg by mouth every evening.      glucose blood (BAYER CONTOUR TEST) test strip USE  STRIP TO CHECK GLUCOSE TWICE DAILY. Dx: E11.9 100 each 12   ipratropium (ATROVENT) 0.06 % nasal spray Place 2 sprays into both nostrils 4 (four) times daily. (Patient taking differently: Place 2 sprays into both nostrils 4 (four) times daily as needed for rhinitis.) 15 mL 12   MICROLET LANCETS MISC Check sugar once daily, [employer] Microlet Lancets. Dx E11.9 100 each 12   pantoprazole (PROTONIX) 40 MG tablet Take 1 tablet by mouth once daily 90 tablet 1   trospium (SANCTURA) 20 MG tablet Take 1 tablet (20 mg total) by mouth at bedtime. 30 tablet 0    Pertinent medications related to GI and procedure were reviewed by me with the patient prior to the procedure   Current Facility-Administered Medications:  0.9 %  sodium chloride infusion, , Intravenous, Continuous, Annamaria Helling, DO, Last Rate: 20 mL/hr at 09/26/21 0947, New Bag at 09/26/21 0947      Allergies  Allergen Reactions   Maxzide [Triamterene-Hctz] Other (See Comments)    Weakness/fatigue    Tape Other (See Comments)    Ob band aid adhesive  Pull skin off / Rash   Cephalexin Rash    Blotchiness to B lower legs after several days of cephalexin for UTI   Hydrochlorothiazide W-Triamterene     Other reaction(s): Other (See Comments) Extreme fatigue/weakness   Allergies were reviewed by me prior to the procedure  Objective   Body mass index is 35.19 kg/m. Vitals:   09/26/21 0932  BP: (!) 184/99  Pulse: 75  Resp: 19  Temp: (!) 96.7 F (35.9 C)  TempSrc: Temporal  SpO2: 94%  Weight: 98.9 kg  Height: '5\' 6"'$  (1.676 m)     Physical Exam Vitals and nursing note reviewed.  Constitutional:      General: She is not in acute distress.    Appearance: Normal appearance. She is not ill-appearing, toxic-appearing or diaphoretic.   HENT:     Head: Normocephalic and atraumatic.     Nose: Nose normal.     Mouth/Throat:     Mouth: Mucous membranes are moist.     Pharynx: Oropharynx is clear.  Eyes:     General: No scleral icterus.    Extraocular Movements: Extraocular movements intact.  Cardiovascular:     Rate and Rhythm: Normal rate and regular rhythm.     Heart sounds: Normal heart sounds. No murmur heard.   No friction rub. No gallop.  Pulmonary:     Effort: Pulmonary effort is normal. No respiratory distress.     Breath sounds: Normal breath sounds. No wheezing, rhonchi or rales.  Abdominal:     General: Bowel sounds are normal. There is no distension.     Palpations: Abdomen is soft.     Tenderness: There is no abdominal tenderness. There is no guarding or rebound.  Musculoskeletal:     Cervical back: Neck supple.     Right lower leg: No edema.     Left lower leg: No edema.  Skin:    General: Skin is warm and dry.     Coloration: Skin is not jaundiced or pale.  Neurological:     General: No focal deficit present.     Mental Status: She is alert and oriented to person, place, and time. Mental status is at baseline.  Psychiatric:        Mood and Affect: Mood normal.        Behavior: Behavior normal.        Thought Content: Thought content normal.        Judgment: Judgment normal.     Assessment:  Ms. KENSLEY VALLADARES is a 81 y.o. female  who presents today for Esophagogastroduodenoscopy and Colonoscopy for Bright red blood per rectum, periumbilical pain, history of Barrett's esophagus.  Plan:  Esophagogastroduodenoscopy and Colonoscopy with possible intervention today  Esophagogastroduodenoscopy and Colonoscopy with possible biopsy, control of bleeding, polypectomy, and interventions as necessary has been discussed with the patient/patient representative. Informed consent was obtained from the patient/patient representative after explaining the indication, nature, and risks of the procedure including  but not limited to death, bleeding, perforation, missed neoplasm/lesions, cardiorespiratory compromise, and reaction to medications. Opportunity for questions was given and appropriate answers were provided. Patient/patient representative has verbalized understanding is  amenable to undergoing the procedure.   Annamaria Helling, DO  [facility]  Portions of the record may have been created with voice recognition software. Occasional wrong-word or 'sound-a-like' substitutions may have occurred due to the inherent limitations of voice recognition software.  Read the chart carefully and recognize, using context, where substitutions may have occurred.

## 2021-09-26 ENCOUNTER — Encounter
Admission: RE | Disposition: A | Discharge status: Home / Self Care | Payer: Self-pay | Source: Home / Self Care | Attending: Gastroenterology

## 2021-09-26 ENCOUNTER — Encounter: Payer: Self-pay | Admitting: Gastroenterology

## 2021-09-26 ENCOUNTER — Ambulatory Visit: Payer: Medicare Other | Admitting: Anesthesiology

## 2021-09-26 ENCOUNTER — Ambulatory Visit
Admission: RE | Admit: 2021-09-26 | Discharge: 2021-09-26 | Disposition: A | Discharge status: Home / Self Care | Payer: Medicare Other | Attending: Gastroenterology | Admitting: Gastroenterology

## 2021-09-26 DIAGNOSIS — K219 Gastro-esophageal reflux disease without esophagitis: Secondary | ICD-10-CM | POA: Diagnosis not present

## 2021-09-26 DIAGNOSIS — Z87891 Personal history of nicotine dependence: Secondary | ICD-10-CM | POA: Insufficient documentation

## 2021-09-26 DIAGNOSIS — E1151 Type 2 diabetes mellitus with diabetic peripheral angiopathy without gangrene: Secondary | ICD-10-CM | POA: Insufficient documentation

## 2021-09-26 DIAGNOSIS — K2270 Barrett's esophagus without dysplasia: Secondary | ICD-10-CM | POA: Insufficient documentation

## 2021-09-26 DIAGNOSIS — K3189 Other diseases of stomach and duodenum: Secondary | ICD-10-CM | POA: Insufficient documentation

## 2021-09-26 DIAGNOSIS — K449 Diaphragmatic hernia without obstruction or gangrene: Secondary | ICD-10-CM | POA: Insufficient documentation

## 2021-09-26 DIAGNOSIS — K5730 Diverticulosis of large intestine without perforation or abscess without bleeding: Secondary | ICD-10-CM | POA: Insufficient documentation

## 2021-09-26 DIAGNOSIS — E1122 Type 2 diabetes mellitus with diabetic chronic kidney disease: Secondary | ICD-10-CM | POA: Diagnosis not present

## 2021-09-26 DIAGNOSIS — Z7902 Long term (current) use of antithrombotics/antiplatelets: Secondary | ICD-10-CM | POA: Diagnosis not present

## 2021-09-26 DIAGNOSIS — K625 Hemorrhage of anus and rectum: Secondary | ICD-10-CM | POA: Insufficient documentation

## 2021-09-26 DIAGNOSIS — N183 Chronic kidney disease, stage 3 unspecified: Secondary | ICD-10-CM | POA: Diagnosis not present

## 2021-09-26 DIAGNOSIS — Z9981 Dependence on supplemental oxygen: Secondary | ICD-10-CM | POA: Insufficient documentation

## 2021-09-26 DIAGNOSIS — G4730 Sleep apnea, unspecified: Secondary | ICD-10-CM | POA: Diagnosis not present

## 2021-09-26 DIAGNOSIS — Z13810 Encounter for screening for upper gastrointestinal disorder: Secondary | ICD-10-CM | POA: Diagnosis not present

## 2021-09-26 DIAGNOSIS — I129 Hypertensive chronic kidney disease with stage 1 through stage 4 chronic kidney disease, or unspecified chronic kidney disease: Secondary | ICD-10-CM | POA: Insufficient documentation

## 2021-09-26 DIAGNOSIS — I272 Pulmonary hypertension, unspecified: Secondary | ICD-10-CM | POA: Diagnosis not present

## 2021-09-26 DIAGNOSIS — K640 First degree hemorrhoids: Secondary | ICD-10-CM | POA: Diagnosis not present

## 2021-09-26 DIAGNOSIS — K2289 Other specified disease of esophagus: Secondary | ICD-10-CM | POA: Insufficient documentation

## 2021-09-26 HISTORY — PX: COLONOSCOPY WITH PROPOFOL: SHX5780

## 2021-09-26 HISTORY — PX: ESOPHAGOGASTRODUODENOSCOPY (EGD) WITH PROPOFOL: SHX5813

## 2021-09-26 LAB — GLUCOSE, CAPILLARY: Glucose-Capillary: 93 mg/dL (ref 70–99)

## 2021-09-26 SURGERY — COLONOSCOPY WITH PROPOFOL
Anesthesia: General

## 2021-09-26 MED ORDER — LIDOCAINE HCL (CARDIAC) PF 100 MG/5ML IV SOSY
PREFILLED_SYRINGE | INTRAVENOUS | Status: DC | PRN
  Administered 2021-09-26: 50 mg via INTRAVENOUS

## 2021-09-26 MED ORDER — PROPOFOL 500 MG/50ML IV EMUL
INTRAVENOUS | Status: AC
  Filled 2021-09-26: qty 50

## 2021-09-26 MED ORDER — LIDOCAINE HCL (PF) 2 % IJ SOLN
INTRAMUSCULAR | Status: AC
  Filled 2021-09-26: qty 5

## 2021-09-26 MED ORDER — PROPOFOL 500 MG/50ML IV EMUL
INTRAVENOUS | Status: DC | PRN
  Administered 2021-09-26: 150 ug/kg/min via INTRAVENOUS

## 2021-09-26 MED ORDER — SODIUM CHLORIDE 0.9 % IV SOLN: INTRAVENOUS | Status: DC

## 2021-09-26 NOTE — Transfer of Care (Signed)
Immediate Anesthesia Transfer of Care Note  Patient: Nina Perez  Procedure(s) Performed: COLONOSCOPY WITH PROPOFOL ESOPHAGOGASTRODUODENOSCOPY (EGD) WITH PROPOFOL  Patient Location: PACU  Anesthesia Type:General  Level of Consciousness: awake and sedated  Airway & Oxygen Therapy: Patient Spontanous Breathing and Patient connected to face mask oxygen  Post-op Assessment: Report given to RN and Post -op Vital signs reviewed and stable  Post vital signs: Reviewed and stable  Last Vitals:  Vitals Value Taken Time  BP    Temp    Pulse    Resp    SpO2      Last Pain:  Vitals:   09/26/21 0932  TempSrc: Temporal  PainSc: 0-No pain         Complications: No notable events documented.

## 2021-09-26 NOTE — Op Note (Signed)
[facility] Gastroenterology Patient Name: Nina Perez Procedure Date: 09/26/2021 10:27 AM MRN: [id number] Account #: [id number] Date of Birth: Oct 17, 1940 Admit Type: Outpatient Age: 81 Room: [facility] ENDO ROOM 1 Gender: Female Note Status: Finalized Instrument Name: Upper Endoscope [id number] Procedure:             Upper GI endoscopy Indications:           Screening for Barrett's esophagus Providers:             Annamaria Helling DO, DO Referring MD:          Einar Pheasant, MD (Referring MD) Medicines:             Monitored Anesthesia Care Complications:         No immediate complications. Estimated blood loss:                         Minimal. Procedure:             Pre-Anesthesia Assessment:                        - Prior to the procedure, a History and Physical was                         performed, and patient medications and allergies were                         reviewed. The patient is competent. The risks and                         benefits of the procedure and the sedation options and                         risks were discussed with the patient. All questions                         were answered and informed consent was obtained.                         Patient identification and proposed procedure were                         verified by the physician, the nurse, the anesthetist                         and the technician in the endoscopy suite. Mental                         Status Examination: alert and oriented. Airway                         Examination: normal oropharyngeal airway and neck                         mobility. Respiratory Examination: clear to                         auscultation. CV Examination: RRR, no murmurs, no S3  or S4. Prophylactic Antibiotics: The patient does not                         require prophylactic antibiotics. Prior                         Anticoagulants: The patient has taken Plavix                          (clopidogrel), last dose was 5 days prior to                         procedure. ASA Grade Assessment: III - A patient with                         severe systemic disease. After reviewing the risks and                         benefits, the patient was deemed in satisfactory                         condition to undergo the procedure. The anesthesia                         plan was to use monitored anesthesia care (MAC).                         Immediately prior to administration of medications,                         the patient was re-assessed for adequacy to receive                         sedatives. The heart rate, respiratory rate, oxygen                         saturations, blood pressure, adequacy of pulmonary                         ventilation, and response to care were monitored                         throughout the procedure. The physical status of the                         patient was re-assessed after the procedure.                        After obtaining informed consent, the endoscope was                         passed under direct vision. Throughout the procedure,                         the patient's blood pressure, pulse, and oxygen                         saturations were monitored continuously. The Endoscope  was introduced through the mouth, and advanced to the                         second part of duodenum. The upper GI endoscopy was                         accomplished without difficulty. The patient tolerated                         the procedure well. Findings:      Localized nodular mucosa was found in the duodenal bulb. Biopsies were       taken with a cold forceps for histology. Estimated blood loss was       minimal.      The exam of the duodenum was otherwise normal.      A 5 cm hiatal hernia was present. Estimated blood loss: none.      The exam of the stomach was otherwise normal.      The Z-line was variable. Biopsies  were taken with a cold forceps for       histology. Estimated blood loss was minimal.      Esophagogastric landmarks were identified: the gastroesophageal junction       was found at 35 cm from the incisors.      The exam of the esophagus was otherwise normal. Impression:            - Nodular mucosa in the duodenal bulb. Biopsied.                        - 5 cm hiatal hernia.                        - Z-line variable. Biopsied.                        - Esophagogastric landmarks identified. Recommendation:        - Discharge patient to home.                        - Resume previous diet.                        - Continue present medications.                        - Await pathology results.                        - see colonoscopy report for plavix recommendations                        - Repeat upper endoscopy for surveillance based on                         pathology results.                        - Return to GI office as previously scheduled.                        - The findings and recommendations were discussed with  the patient. Procedure Code(s):     --- Professional ---                        [id number], Esophagogastroduodenoscopy, flexible,                         transoral; with biopsy, single or multiple Diagnosis Code(s):     --- Professional ---                        K31.89, Other diseases of stomach and duodenum                        K44.9, Diaphragmatic hernia without obstruction or                         gangrene                        K22.8, Other specified diseases of esophagus                        Z13.810, Encounter for screening for upper                         gastrointestinal disorder CPT copyright 2019 [employer]. All rights reserved. The codes documented in this report are preliminary and upon coder review may  be revised to meet current compliance requirements. Attending Participation:      I personally performed the  entire procedure. Volney American, DO Annamaria Helling DO, DO 09/26/2021 10:48:10 AM This report has been signed electronically. Number of Addenda: 0 Note Initiated On: 09/26/2021 10:27 AM Estimated Blood Loss:  Estimated blood loss was minimal.      [facility]

## 2021-09-26 NOTE — Anesthesia Procedure Notes (Signed)
Date/Time: 09/26/2021 10:40 AM Performed by: Vaughan Sine Pre-anesthesia Checklist: Patient identified, Emergency Drugs available, Suction available, Timeout performed and Patient being monitored Patient Re-evaluated:Patient Re-evaluated prior to induction Oxygen Delivery Method: Supernova nasal CPAP Preoxygenation: Pre-oxygenation with 100% oxygen Induction Type: IV induction Airway Equipment and Method: Bite block Placement Confirmation: positive ETCO2 and CO2 detector

## 2021-09-26 NOTE — Op Note (Signed)
[facility] Gastroenterology Patient Name: Nina Perez Procedure Date: 09/26/2021 10:26 AM MRN: [id number] Account #: [id number] Date of Birth: 11/04/1940 Admit Type: Outpatient Age: 81 Room: [facility] ENDO ROOM 1 Gender: Female Note Status: Finalized Instrument Name: Colonoscope [id number] Procedure:             Colonoscopy Indications:           Rectal bleeding Providers:             Annamaria Helling DO, DO Referring MD:          Einar Pheasant, MD (Referring MD) Medicines:             Monitored Anesthesia Care Complications:         No immediate complications. Estimated blood loss: None. Procedure:             Pre-Anesthesia Assessment:                        - Prior to the procedure, a History and Physical was                         performed, and patient medications and allergies were                         reviewed. The patient is competent. The risks and                         benefits of the procedure and the sedation options and                         risks were discussed with the patient. All questions                         were answered and informed consent was obtained.                         Patient identification and proposed procedure were                         verified by the physician, the nurse, the anesthetist                         and the technician in the endoscopy suite. Mental                         Status Examination: alert and oriented. Airway                         Examination: normal oropharyngeal airway and neck                         mobility. Respiratory Examination: clear to                         auscultation. CV Examination: RRR, no murmurs, no S3                         or S4. Prophylactic Antibiotics: The patient does not  require prophylactic antibiotics. Prior                         Anticoagulants: The patient has taken Plavix                         (clopidogrel), last dose was 5 days prior to                          procedure. ASA Grade Assessment: III - A patient with                         severe systemic disease. After reviewing the risks and                         benefits, the patient was deemed in satisfactory                         condition to undergo the procedure. The anesthesia                         plan was to use monitored anesthesia care (MAC).                         Immediately prior to administration of medications,                         the patient was re-assessed for adequacy to receive                         sedatives. The heart rate, respiratory rate, oxygen                         saturations, blood pressure, adequacy of pulmonary                         ventilation, and response to care were monitored                         throughout the procedure. The physical status of the                         patient was re-assessed after the procedure.                        After obtaining informed consent, the colonoscope was                         passed under direct vision. Throughout the procedure,                         the patient's blood pressure, pulse, and oxygen                         saturations were monitored continuously. The                         Colonoscope was introduced through the anus with the  intention of advancing to the cecum. The scope was                         advanced to the sigmoid colon before the procedure was                         aborted. Medications were given. The colonoscopy was                         performed with difficulty due to multiple diverticula                         in the colon. Successful completion of the procedure                         was aided by changing the patient to a prone position,                         withdrawing the scope and replacing with the pediatric                         colonoscope, straightening and shortening the scope to                          obtain bowel loop reduction, applying abdominal                         pressure and lavage. The patient tolerated the                         procedure well. The quality of the bowel preparation                         was excellent. Findings:      The perianal and digital rectal examinations were normal. Pertinent       negatives include normal sphincter tone.      Multiple small-mouthed diverticula were found in the recto-sigmoid colon       and sigmoid colon. Severe diverticulosis in the sigmoid colon- unable to       traverse past sigmoid colon due to acute angle and fixation of colon       alogn with diverticuli Estimated blood loss: none.      Non-bleeding internal hemorrhoids were found during retroflexion. The       hemorrhoids were Grade I (internal hemorrhoids that do not prolapse).       Estimated blood loss: none.      Multiple sessile polyps were found in the recto-sigmoid colon. The       polyps were 1 mm in size. Multiple hyperplastic appearing polyps which       were not removed. Estimated blood loss: none. Impression:            - Diverticulosis in the recto-sigmoid colon and in the                         sigmoid colon.                        - Non-bleeding internal hemorrhoids.                        -  Multiple 1 mm polyps at the recto-sigmoid colon.                        - No specimens collected. Recommendation:        - Discharge patient to home.                        - Resume previous diet.                        - Continue present medications.                        - Resume Plavix (clopidogrel) at prior dose tomorrow.                         Refer to managing physician for further adjustment of                         therapy.                        - Return to GI clinic as previously scheduled.                        - The findings and recommendations were discussed with                         the patient. Procedure Code(s):     --- Professional ---                         [id number], 80, Colonoscopy, flexible; diagnostic,                         including collection of specimen(s) by brushing or                         washing, when performed (separate procedure) Diagnosis Code(s):     --- Professional ---                        K64.0, First degree hemorrhoids                        K63.5, Polyp of colon                        K62.5, Hemorrhage of anus and rectum                        K57.30, Diverticulosis of large intestine without                         perforation or abscess without bleeding CPT copyright 2019 American [employer] Association. All rights reserved. The codes documented in this report are preliminary and upon coder review may  be revised to meet current compliance requirements. Attending Participation:      I personally performed the entire procedure. Volney American, DO Annamaria Helling DO, DO 09/26/2021 11:23:22 AM This report has been signed electronically. Number of Addenda: 0 Note Initiated On: 09/26/2021 10:26 AM Total Procedure Duration: 0  hours 25 minutes 37 seconds  Estimated Blood Loss:  Estimated blood loss: none.      [facility]

## 2021-09-26 NOTE — Interval H&P Note (Signed)
History and Physical Interval Note: Preprocedure H&P from 09/26/21  was reviewed and there was no interval change after seeing and examining the patient.  Written consent was obtained from the patient after discussion of risks, benefits, and alternatives. Patient has consented to proceed with Esophagogastroduodenoscopy and Colonoscopy with possible intervention   09/26/2021 10:26 AM  Nina Perez  has presented today for surgery, with the diagnosis of HX OF RECTAL BLEEDING,HX OF COLONIC POLYPS, BARRETT'S ESOPHAGUS, OCCULT BLOOD (+).  The various methods of treatment have been discussed with the patient and family. After consideration of risks, benefits and other options for treatment, the patient has consented to  Procedure(s) with comments: COLONOSCOPY WITH PROPOFOL (N/A) - DM, WHEELCHAIR, PLAVIX ESOPHAGOGASTRODUODENOSCOPY (EGD) WITH PROPOFOL (N/A) as a surgical intervention.  The patient's history has been reviewed, patient examined, no change in status, stable for surgery.  I have reviewed the patient's chart and labs.  Questions were answered to the patient's satisfaction.     Annamaria Helling

## 2021-09-26 NOTE — Anesthesia Preprocedure Evaluation (Signed)
Anesthesia Evaluation  Patient identified by MRN, date of birth, ID band Patient awake    Reviewed: Allergy & Precautions, H&P , NPO status , Patient's Chart, lab work & pertinent test results  History of Anesthesia Complications Negative for: history of anesthetic complications  Airway Mallampati: II  TM Distance: >3 FB Neck ROM: full    Dental  (+) Edentulous Upper, Missing, Dental Advidsory Given,    Pulmonary shortness of breath and Long-Term Oxygen Therapy, sleep apnea and Continuous Positive Airway Pressure Ventilation , neg COPD, neg recent URI, former smoker,  unclear indication for home O2, wears 2L/min PRN SpO2 95% room air in pre-op          Cardiovascular hypertension, (-) angina+ Peripheral Vascular Disease  (-) Past MI and (-) Cardiac Stents + dysrhythmias (LBBB) + Valvular Problems/Murmurs   Mild pulmonary hypertension  Echo April 2019: NORMAL LEFT VENTRICULAR SYSTOLIC FUNCTION WITH AN ESTIMATED EF = 55 % NORMAL RIGHT VENTRICULAR SYSTOLIC FUNCTION MILD TRICSUPID AND MITRAL VALVE INSUFFICIENCY TRACE AORTIC VALVE INSUFFICIENCY NO VALVULAR STENOSIS MILD RV ENLARGEMENT MILD BIATRIAL ENLARGEMENT MILD LVH PARADOXICAL SEPTAL WALL MOTION CONSISTENT WITH PROBABLE LEFT BUNDLE BRANCH BLOCK   Neuro/Psych negative neurological ROS  negative psych ROS   GI/Hepatic Neg liver ROS, GERD  Medicated and Controlled,  Endo/Other  diabetesMorbid obesity  Renal/GU CRFRenal disease  negative genitourinary   Musculoskeletal   Abdominal   Peds  Hematology negative hematology ROS (+)   Anesthesia Other Findings Obesity  Past Medical History: No date: Barrett's esophagus No date: Cancer ([place])     Comment:  skin ca No date: Diabetes mellitus (HCC) No date: Gout No date: History of colon cancer     Comment:  adenomatous polyps No date: Hypercholesterolemia No date: Hypertension No date: Inflammatory arthritis No  date: Osteoarthritis     Comment:  knees, spine No date: Phlebitis     Comment:  x2 (with pregnancy) No date: Pulmonary fibrosis (HCC)     Comment:  mild No date: Pulmonary hypertension (HCC) No date: Reflux esophagitis No date: Rheumatoid arthritis(714.0)     Comment:  positive RF, FANA, RNP, negative CCP ab and anti DNA,.                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, sclerodactyly, telangiectasias  Past Surgical History: 05/16/2019: ABDOMINAL AORTOGRAM W/LOWER EXTREMITY; Left     Comment:  Procedure: ABDOMINAL AORTOGRAM W/LOWER EXTREMITY;                Surgeon: Lorretta Harp, MD;  Location: [place] [facility];  Service: Cardiovascular;  Laterality: Left; 06/02/2019: AMPUTATION TOE; Left     Comment:  Procedure: AMPUTATION TOE;  Surgeon: Albertine Patricia,               DPM;  Location: [facility];  Service: Podiatry;                Laterality: Left; No date: BREAST CYST ASPIRATION; Left     Comment:  neg 06/03/2019: LOWER EXTREMITY ANGIOGRAPHY; Left     Comment:  Procedure: Lower Extremity Angiography;  Surgeon:               Katha Cabal, MD;  Location: [place] [facility];               Service: Cardiovascular;  Laterality: Left; 05/16/2019: PERIPHERAL VASCULAR BALLOON  ANGIOPLASTY; Left     Comment:  Procedure: PERIPHERAL VASCULAR BALLOON ANGIOPLASTY;                Surgeon: Lorretta Harp, MD;  Location: [place] [facility];  Service: Cardiovascular;  Laterality: Left;  SFA No date: VEIN LIGATION AND STRIPPING  BMI    Body Mass Index: 36.01 kg/m      Reproductive/Obstetrics negative OB ROS                             Anesthesia Physical  Anesthesia Plan  ASA: 3  Anesthesia Plan: General and General LMA   Post-op Pain Management:    Induction: Intravenous  PONV Risk Score and Plan: Propofol infusion and TIVA  Airway Management Planned: Natural Airway and Nasal  Cannula  Additional Equipment:   Intra-op Plan:   Post-operative Plan:   Informed Consent: I have reviewed the patients History and Physical, chart, labs and discussed the procedure including the risks, benefits and alternatives for the proposed anesthesia with the patient or authorized representative who has indicated his/her understanding and acceptance.     Dental Advisory Given  Plan Discussed with: Anesthesiologist  Anesthesia Plan Comments: (Patient consented for risks of anesthesia including but not limited to:  - adverse reactions to medications - damage to teeth, lips or other oral mucosa - sore throat or hoarseness - Damage to heart, brain, lungs or loss of life  Patient voiced understanding.)        Anesthesia Quick Evaluation

## 2021-09-27 ENCOUNTER — Encounter: Payer: Self-pay | Admitting: Gastroenterology

## 2021-09-27 LAB — SURGICAL PATHOLOGY

## 2021-09-27 NOTE — Anesthesia Postprocedure Evaluation (Signed)
Anesthesia Post Note  Patient: Nina Perez  Procedure(s) Performed: COLONOSCOPY WITH PROPOFOL ESOPHAGOGASTRODUODENOSCOPY (EGD) WITH PROPOFOL  Patient location during evaluation: Endoscopy Anesthesia Type: General Level of consciousness: awake and alert Pain management: pain level controlled Vital Signs Assessment: post-procedure vital signs reviewed and stable Respiratory status: spontaneous breathing, nonlabored ventilation, respiratory function stable and patient connected to nasal cannula oxygen Cardiovascular status: blood pressure returned to baseline and stable Postop Assessment: no apparent nausea or vomiting Anesthetic complications: no   No notable events documented.   Last Vitals:  Vitals:   09/26/21 1130 09/26/21 1155  BP: (!) 124/54 (!) 150/73  Pulse:    Resp:    Temp: (!) 35.6 C   SpO2:      Last Pain:  Vitals:   09/26/21 1215  TempSrc:   PainSc: 0-No pain                 Martha Clan

## 2021-10-04 ENCOUNTER — Other Ambulatory Visit: Payer: Self-pay | Admitting: Internal Medicine

## 2021-10-18 ENCOUNTER — Other Ambulatory Visit: Payer: Self-pay | Admitting: Internal Medicine

## 2021-10-23 NOTE — Discharge Instructions (Signed)
Instructions after Total Knee Replacement   Waldron Gerry P. Gaynor Ferreras, Jr., M.D.     Dept. of Orthopaedics & Sports Medicine  [facility]  [address]  [place], [place]  [address]  Phone: [phone]   Fax: [phone]    DIET: Drink plenty of non-alcoholic fluids. Resume your normal diet. Include foods high in fiber.  ACTIVITY:  You may use crutches or a walker with weight-bearing as tolerated, unless instructed otherwise. You may be weaned off of the walker or crutches by your Physical Therapist.  Do NOT place pillows under the knee. Anything placed under the knee could limit your ability to straighten the knee.   Continue doing gentle exercises. Exercising will reduce the pain and swelling, increase motion, and prevent muscle weakness.   Please continue to use the TED compression stockings for 6 weeks. You may remove the stockings at night, but should reapply them in the morning. Do not drive or operate any equipment until instructed.  WOUND CARE:  Continue to use the PolarCare or ice packs periodically to reduce pain and swelling. You may bathe or shower after the staples are removed at the first office visit following surgery.  MEDICATIONS: You may resume your regular medications. Please take the pain medication as prescribed on the medication. Do not take pain medication on an empty stomach. You have been given a prescription for a blood thinner (Lovenox or Coumadin). Please take the medication as instructed. (NOTE: After completing a 2 week course of Lovenox, take one Enteric-coated aspirin once a day. This along with elevation will help reduce the possibility of phlebitis in your operated leg.) Do not drive or drink alcoholic beverages when taking pain medications.  CALL THE OFFICE FOR: Temperature above 101 degrees Excessive bleeding or drainage on the dressing. Excessive swelling, coldness, or paleness of the toes. Persistent nausea and vomiting.  FOLLOW-UP:  You  should have an appointment to return to the office in 10-14 days after surgery. Arrangements have been made for continuation of Physical Therapy (either home therapy or outpatient therapy).   [facility] Department Directory         [email or web address]       [email or web address]          Cardiology  Appointments: [place] - [phone] [place] - [phone]  Endocrinology  Appointments: [place] - [phone] [place] - [phone]  Gastroenterology  Appointments: [place] - [phone] [place] - [phone]        General Surgery   Appointments: [place] - [phone]  Internal Medicine/Family Medicine  Appointments: [place] - [phone] [place] - [phone] [place] - [phone]  Metabolic and Weigh Loss Surgery  Appointments: [place] - [phone]        Neurology  Appointments: [place] - [phone] [place] - [phone]  Neurosurgery  Appointments: [place] - [phone]  Obstetrics & Gynecology  Appointments: [place] - [phone] [place] - [phone]        Pediatrics  Appointments: [place] - [phone] [place] - [phone]  Physiatry  Appointments: [place] -[phone]  Physical Therapy  Appointments: [place] - [phone] [place] - [phone]        Podiatry  Appointments: [place] - [phone] [place] - [phone]  Pulmonology  Appointments: [place] - [phone]  Rheumatology  Appointments: [place] - [phone]        [place] Location: [facility]  [address] , [place]  [address]  [place] Location: [facility] [address] [place], [place]  [address]  [place] Location: [facility] [address] [place],   [address]    

## 2021-10-25 ENCOUNTER — Other Ambulatory Visit: Payer: Self-pay | Admitting: Internal Medicine

## 2021-11-01 ENCOUNTER — Other Ambulatory Visit: Payer: Self-pay

## 2021-11-01 ENCOUNTER — Encounter
Admission: RE | Admit: 2021-11-01 | Discharge: 2021-11-01 | Disposition: A | Discharge status: Home / Self Care | Payer: Medicare Other | Source: Ambulatory Visit | Attending: Orthopedic Surgery | Admitting: Orthopedic Surgery

## 2021-11-01 VITALS — BP 143/55 | HR 75 | Resp 18

## 2021-11-01 DIAGNOSIS — E1122 Type 2 diabetes mellitus with diabetic chronic kidney disease: Secondary | ICD-10-CM | POA: Insufficient documentation

## 2021-11-01 DIAGNOSIS — R8290 Unspecified abnormal findings in urine: Secondary | ICD-10-CM

## 2021-11-01 DIAGNOSIS — R300 Dysuria: Secondary | ICD-10-CM | POA: Insufficient documentation

## 2021-11-01 DIAGNOSIS — M1711 Unilateral primary osteoarthritis, right knee: Secondary | ICD-10-CM | POA: Diagnosis not present

## 2021-11-01 DIAGNOSIS — N1831 Chronic kidney disease, stage 3a: Secondary | ICD-10-CM | POA: Insufficient documentation

## 2021-11-01 DIAGNOSIS — M341 CR(E)ST syndrome: Secondary | ICD-10-CM | POA: Diagnosis not present

## 2021-11-01 DIAGNOSIS — Z01818 Encounter for other preprocedural examination: Secondary | ICD-10-CM | POA: Insufficient documentation

## 2021-11-01 DIAGNOSIS — D696 Thrombocytopenia, unspecified: Secondary | ICD-10-CM | POA: Insufficient documentation

## 2021-11-01 DIAGNOSIS — E1159 Type 2 diabetes mellitus with other circulatory complications: Secondary | ICD-10-CM | POA: Insufficient documentation

## 2021-11-01 DIAGNOSIS — M069 Rheumatoid arthritis, unspecified: Secondary | ICD-10-CM | POA: Diagnosis not present

## 2021-11-01 DIAGNOSIS — I272 Pulmonary hypertension, unspecified: Secondary | ICD-10-CM | POA: Diagnosis not present

## 2021-11-01 DIAGNOSIS — Z01812 Encounter for preprocedural laboratory examination: Secondary | ICD-10-CM

## 2021-11-01 LAB — HEMOGLOBIN A1C
Hgb A1c MFr Bld: 5.8 % — ABNORMAL HIGH (ref 4.8–5.6)
Mean Plasma Glucose: 119.76 mg/dL

## 2021-11-01 LAB — URINALYSIS, ROUTINE W REFLEX MICROSCOPIC
Bilirubin Urine: NEGATIVE
Glucose, UA: NEGATIVE mg/dL
Hgb urine dipstick: NEGATIVE
Ketones, ur: NEGATIVE mg/dL
Nitrite: POSITIVE — AB
Protein, ur: 30 mg/dL — AB
Specific Gravity, Urine: 1.013 (ref 1.005–1.030)
pH: 5 (ref 5.0–8.0)

## 2021-11-01 LAB — SEDIMENTATION RATE: Sed Rate: 18 mm/hr (ref 0–30)

## 2021-11-01 LAB — COMPREHENSIVE METABOLIC PANEL
ALT: 16 U/L (ref 0–44)
AST: 24 U/L (ref 15–41)
Albumin: 3.3 g/dL — ABNORMAL LOW (ref 3.5–5.0)
Alkaline Phosphatase: 73 U/L (ref 38–126)
Anion gap: 5 (ref 5–15)
BUN: 17 mg/dL (ref 8–23)
CO2: 26 mmol/L (ref 22–32)
Calcium: 9.6 mg/dL (ref 8.9–10.3)
Chloride: 108 mmol/L (ref 98–111)
Creatinine, Ser: 0.88 mg/dL (ref 0.44–1.00)
GFR, Estimated: >60 mL/min (ref >60)
Glucose, Bld: 133 mg/dL — ABNORMAL HIGH (ref 70–99)
Potassium: 4 mmol/L (ref 3.5–5.1)
Sodium: 139 mmol/L (ref 135–145)
Total Bilirubin: 0.9 mg/dL (ref 0.3–1.2)
Total Protein: 6.8 g/dL (ref 6.5–8.1)

## 2021-11-01 LAB — TYPE AND SCREEN
ABO/RH(D): B NEG
Antibody Screen: NEGATIVE

## 2021-11-01 LAB — CBC
HCT: 39.4 % (ref 36.0–46.0)
Hemoglobin: 11.9 g/dL — ABNORMAL LOW (ref 12.0–15.0)
MCH: 31 pg (ref 26.0–34.0)
MCHC: 30.2 g/dL (ref 30.0–36.0)
MCV: 102.6 fL — ABNORMAL HIGH (ref 80.0–100.0)
Platelets: 89 10*3/uL — ABNORMAL LOW (ref 150–400)
RBC: 3.84 MIL/uL — ABNORMAL LOW (ref 3.87–5.11)
RDW: 18.1 % — ABNORMAL HIGH (ref 11.5–15.5)
WBC: 4.8 10*3/uL (ref 4.0–10.5)
nRBC: 0 % (ref 0.0–0.2)

## 2021-11-01 LAB — C-REACTIVE PROTEIN: CRP: 0.7 mg/dL (ref <1.0)

## 2021-11-01 LAB — SURGICAL PCR SCREEN
MRSA, PCR: NEGATIVE
Staphylococcus aureus: NEGATIVE

## 2021-11-03 LAB — URINE CULTURE: Culture: >=100000 — AB

## 2021-11-07 ENCOUNTER — Encounter: Payer: Self-pay | Admitting: Orthopedic Surgery

## 2021-11-07 NOTE — Progress Notes (Signed)
Perioperative Services  Pre-Admission/Anesthesia Testing Clinical Review  Date: 11/07/21  Patient Demographics:  Name: Nina Perez DOB:   December 14, 1940 MRN:   [id number]  Planned Surgical Procedure(s):    Case: [id number] Date/Time: 11/13/21 1232   Procedure: COMPUTER ASSISTED TOTAL KNEE ARTHROPLASTY (Right: Knee)   Anesthesia type: Choice   Pre-op diagnosis: PRIMARY OSTEOARTHRITIS OF RIGHT KNEE   Location: [facility] [place]   Surgeons: Dereck Leep, MD   NOTE: Available PAT nursing documentation and vital signs have been reviewed. Clinical nursing staff has updated patient's PMH/PSHx, current medication list, and drug allergies/intolerances to ensure comprehensive history available to assist in medical decision making as it pertains to the aforementioned surgical procedure and anticipated anesthetic course. Extensive review of available clinical information performed. [place] PMH and PSHx updated with any diagnoses/procedures that  may have been inadvertently omitted during her intake with the pre-admission testing department's nursing staff.  Clinical Discussion:  Nina Perez is a 81 y.o. female who is submitted for pre-surgical anesthesia review and clearance prior to her undergoing the above procedure. Patient is a Former Smoker (7.5 pack years; quit 05/1988). Pertinent PMH includes: LBBB, cardiac murmur, pulmonary hypertension, aortic atherosclerosis, PAD, HTN, HLD, T2DM, CKD-III, ILD/mild pulmonary fibrosis, DOE (uses supplemental oxygen), OSAH (requires nocturnal PAP therapy), GERD (on daily PPI), Barrett's esophagus, hiatal hernia, RA, OA, scleroderma (with associated CREST syndrome).   Patient is followed by cardiology Nehemiah Massed, MD). She was last seen in the cardiology clinic on 06/11/2021; notes reviewed.  At the time of her clinic visit, the patient denied any chest pain, PND, orthopnea, palpitations, significant peripheral edema, vertiginous  symptoms, or presyncope/syncope.  Patient with chronic DOE related to an underlying ILD/pulmonary fibrosis diagnosis.  She is jointly managed by pulmonary medicine Lanney Gins, MD). She is on supplemental oxygen due to her exertional dyspnea.  Patient has declined treatment for her pulmonary hypertension.  Patient with a PMH significant for cardiovascular diagnoses.  TTE performed on 09/02/2017 revealed normal left ventricular systolic function with an estimated EF of >55%.  There was mild LVH, mild right ventricular enlargement, and mild biatrial enlargement.  Trivial aortic/pulmonic and mild mitral/tricuspid valve regurgitation noted there was no significant transvalvular gradient to suggest stenosis.  Study revealed paradoxical septal wall motion consistent with LBBB.  Myocardial perfusion imaging study performed on 11/05/2020 revealed an LVEF of 56%. There were no regional wall motion abnormalities.  There was no evidence of stress-induced myocardial ischemia or arrhythmia.  Study indeterminate overall due to baseline EKG changes (LBBB).  Patient underwent an abdominal aortogram with lower extremity peripheral vascular balloon angioplasty on 05/16/2019. Study revealed that the abdominal aorta renal arteries were widely patent.  The infrarenal abdominal aorta was free of significant atherosclerotic changes.  Iliac arteries appeared normal bilaterally.  In the left lower extremity, there was a 60% lesion of the mid LEFT SFA with one-vessel runoff via the peroneal.  The peroneal gave collaterals to the dorsalis pedis at the level of the ankle.  POBA performed resulting in a 0% residual stenosis, no evidence of dissection, and excellent angiographic flow.  Given PVD, patient on daily DAPT therapy (ASA + clopidogrel); compliant with therapy with no evidence of GI bleeding.  Blood pressure reasonably controlled at 130/72 on currently prescribed ARB and beta-blocker therapies.  Patient is on a statin for her HLD  diagnosis and ASCVD prevention. T2DM well-controlled on currently prescribed regimen; last Hgb A1c was 6.1% when checked on 07/17/2021.  Patient has an OSHA diagnosis and is reported to be compliant with prescribed nocturnal PAP therapy. Functional capacity limited by PVD, age, and underlying cardiopulmonary diagnoses.  She is felt to be unable to achieve at least 4 METS of activity without angina/anginal equivalent symptoms.  No changes were made to patient's medication regimen.  Patient to follow-up with outpatient cardiology in 6 months or sooner if needed.  Nina Perez is scheduled for an elective RIGHT COMPUTER ASSISTED TOTAL KNEE ARTHROPLASTY on 11/13/2021 with Dr. Skip Estimable, MD.  Given patient's past medical history significant for cardiovascular/cardiopulmonary diagnoses, presurgical clearances from patient's cardiologist and pulmonary medicine providers were sought by the PAT team.  Specialty clearances were obtained as follows   Per pulmonary medicine, "ARISCAT ([place]) preoperative pulmonary risk index in adults- low risk: 1.6% pulmonary postoperative complication rate. [place] respiratory failure index- low risk: 1.8% pulmonary postoperative complication rate. [place]del Safe calculator for postoperative respiratory failure- low risk: 1.07% probability of postoperative respiratory failure. This patient is optimized for surgery and may proceed with the planned procedural course with a LOW risk of significant perioperative cardiovascular complications. Recommendation for patient to use incentive spirometer post operatively. Patient on aspirin and Plavix.  Please ask cardiology if okay to stop or not".   Per cardiology, "this patient is optimized for surgery and may proceed with the planned procedural course with a LOW risk of significant perioperative cardiovascular complications". Again, this patient is on daily DAPT therapy. She has been instructed on recommendations from her cardiologist for holding  her low-dose ASA for 3 days (last dose 11/09/2021) and her clopidogrel for 5 days (last dose of 11/07/2021) prior to her procedure with plans to restart her since postoperative bleeding risk felt to be minimized by her primary attending surgeon.  Patient denies previous perioperative complications with anesthesia in the past. In review of the available records, it is noted that patient underwent a general anesthetic course here at [facility] (ASA III) in 09/2021 without documented complications.      11/01/2021   10:31 AM 09/26/2021   11:55 AM 09/26/2021   11:30 AM  Vitals with BMI  Systolic 283 151 761  Diastolic 55 73 54  Pulse 75      Providers/Specialists:   NOTE: Primary physician provider listed below. Patient may have been seen by APP or partner within same practice.   PROVIDER ROLE / SPECIALTY LAST OV  Hooten, Laurice Record, MD Orthopedics 10/30/2021  Einar Pheasant, MD Primary Care Provider 08/20/2021  Serafina Royals, MD Cardiology 06/11/2021  Ephriam Jenkins, MD Rheumatology 09/24/2021  Ottie Glazier, MD Pulmonary Medicine 06/28/2021   Allergies:  Maxzide [triamterene-hctz], Tape, Cephalexin, and Hydrochlorothiazide w-triamterene  Current Home Medications:   No current facility-administered medications for this encounter.    acetaminophen (TYLENOL) 650 MG CR tablet   allopurinol (ZYLOPRIM) 300 MG tablet   aspirin 81 MG tablet   atorvastatin (LIPITOR) 40 MG tablet   clopidogrel (PLAVIX) 75 MG tablet   fexofenadine (ALLEGRA) 607 MG tablet   folic acid (FOLVITE) 1 MG tablet   glucose blood (BAYER CONTOUR TEST) test strip   ipratropium (ATROVENT) 0.06 % nasal spray   losartan (COZAAR) 100 MG tablet   metFORMIN (GLUCOPHAGE) 500 MG tablet   methotrexate (RHEUMATREX) 2.5 MG tablet   metoprolol succinate (TOPROL-XL) 50 MG 24 hr tablet   MICROLET LANCETS MISC   pantoprazole (PROTONIX) 40 MG tablet   torsemide (DEMADEX) 20 MG tablet    triamcinolone cream (KENALOG) 0.1 %  trospium (SANCTURA) 20 MG tablet   History:   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[place])    a.) 2/2 scleroderma Dx   LBBB (left bundle branch block)    Long term current use of antithrombotics/antiplatelets    a.) ASA + clopidogrel   Long term current use of immunosuppressive drug    a.) MTX for RA and scleroderma Dx   OSA on CPAP    Osteoarthritis    a.) knees, spine   PAD (peripheral artery disease) (HCC)    Phlebitis    a.) x 2 (with pregnancy)   Pulmonary fibrosis ([place])    a.) mild   Pulmonary hypertension ([place]) 02/24/2012   a.) TTE 02/24/2012: EF 55%, RVSP 33; b.) TTE 10/12/2014: EF >55%, RVSP 36.1; c.) TTE 09/02/2017: EF 55%, RVSP 45.5   Reflux esophagitis    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 b.) renal US 05/23/2020: solid LEFT renal mass measuring 1.4 x 1.5 x 1.6. c.) MRI abd 12/04/2020: approx 1.2 cm fat signal lesion c/w benign angiolipoma   Rheumatoid arthritis    a.) positive  RF, FANA, RNP; negative CCP Ab and anti DNA; neg anti-SCL 70   Scleroderma ([place])    a.) with (+) CREST syndrome --> raynaud's, sclerodactyly, telangiectasias   Skin cancer 2018   Supplemental oxygen dependent    a.) 2L/[place] PRN for DOE related to mild pulmonary fibrosis and pulmonary HTN   T2DM (type 2 diabetes mellitus) ([place])    Past Surgical History:  Procedure Laterality Date   ABDOMINAL AORTOGRAM W/LOWER EXTREMITY Left 05/16/2019   Procedure: ABDOMINAL AORTOGRAM W/LOWER  EXTREMITY;  Surgeon: Lorretta Harp, MD;  Location: [facility];  Service: Cardiovascular;  Laterality: Left;   AMPUTATION TOE Left 06/02/2019   Procedure: AMPUTATION TOE;  Surgeon: Albertine Patricia, DPM;  Location: [facility];  Service: Podiatry;  Laterality: Left;   APPENDECTOMY  1972   BREAST CYST ASPIRATION Left    neg   COLONOSCOPY WITH PROPOFOL N/A 09/26/2021   Procedure: COLONOSCOPY WITH PROPOFOL;  Surgeon: Annamaria Helling, DO;  Location: [facility] END[facility]COPY;  Service: Gastroenterology;  Laterality: N/A;  DM, WHEELCHAIR, PLAVIX   ESOPHAGOGASTRODUODENOSCOPY (EGD) WITH PROPOFOL N/A 09/26/2021   Procedure: ESOPHAGOGASTRODUODENOSCOPY (EGD) WITH PROPOFOL;  Surgeon: Annamaria Helling, DO;  Location: [facility] ENDOSCOPY;  Service: Gastroenterology;  Laterality: N/A;   INCISION AND DRAINAGE OF WOUND Left 06/07/2019   Procedure: IRRIGATION AND DEBRIDEMENT WOUND;  Surgeon: Albertine Patricia, DPM;  Location: [facility];  Service: Podiatry;  Laterality: Left;   LOWER EXTREMITY ANGIOGRAPHY Left 06/03/2019   Procedure: Lower Extremity Angiography;  Surgeon: Katha Cabal, MD;  Location: [facility];  Service: Cardiovascular;  Laterality: Left;   LOWER EXTREMITY ANGIOGRAPHY Right 05/21/2021   Procedure: LOWER EXTREMITY ANGIOGRAPHY;  Surgeon: Katha Cabal, MD;  Location: [facility];  Service: Cardiovascular;  Laterality: Right;   PERIPHERAL VASCULAR BALLOON ANGIOPLASTY Left 05/16/2019   Procedure: PERIPHERAL VASCULAR BALLOON ANGIOPLASTY;  Surgeon: Lorretta Harp, MD;  Location: [facility];  Service: Cardiovascular;  Laterality: Left;  SFA   TUBAL LIGATION  1972   VEIN LIGATION AND STRIPPING     Family History  Problem Relation Age of Onset   Arthritis Mother    Heart attack Father    Colon cancer Neg Hx    Breast cancer Neg Hx    Social History   Tobacco Use   Smoking status: Former    Packs/day: 0.50    Years: 15.00    Total pack years: 7.50     Types: Cigarettes    Quit date: 05/12/1988    Years since quitting: 33.5   Smokeless tobacco: Never  Vaping Use   Vaping Use: Never used  Substance Use Topics   Alcohol use: No    Alcohol/week: 0.0 standard drinks of alcohol   Drug use: No    Pertinent Clinical Results:  LABS: Labs reviewed: Acceptable for surgery.  Component Date Value Ref Range Status   ABO/RH(D) 11/01/2021 B NEG   Final   Antibody Screen 11/01/2021 NEG   Final   Sample Expiration 11/01/2021 11/15/2021, 2359   Final   Extend sample reason 11/01/2021    Final                   Value:NO TRANSFUSIONS OR PREGNANCY IN THE PAST 3 MONTHS Performed at [facility], [address] [place], [place] [address]    MRSA, PCR 11/01/2021 NEGATIVE  NEGATIVE Final   Staphylococcus aureus 11/01/2021 NEGATIVE  NEGATIVE Final   Comment: (NOTE) The Xpert SA Assay (FDA approved for NASAL specimens in patients 38 years of age and older), is one component of a comprehensive surveillance program. It is not intended to diagnose infection nor to guide or monitor treatment. Performed at [facility], [place], [place] [address]    CRP 11/01/2021 0.7  <1.0 mg/dL Final   Performed at [facility] [address], [place], [place] [address]   Sed Rate 11/01/2021 18  0 - 30 mm/hr Final   Performed at [facility], [place], [place] [address]   Hgb A1c MFr Bld 11/01/2021 5.8 (H)  4.8 - 5.6 % Final   Comment: (NOTE) Pre diabetes:          5.7%-6.4%  Diabetes:              >6.4%  Glycemic control for   <7.0% adults with diabetes    Mean Plasma Glucose 11/01/2021 119.76  mg/dL Final   Performed at [facility], [place] [address] [place], [place] [address]   WBC 11/01/2021 4.8  4.0 - 10.5 K/uL Final   RBC 11/01/2021 3.84 (L)  3.87 - 5.11 MIL/uL Final   Hemoglobin 11/01/2021 11.9 (L)  12.0 - 15.0 g/dL Final   HCT 11/01/2021 39.4  36.0 - 46.0 % Final   MCV 11/01/2021  102.6 (H)  80.0 - 100.0 fL Final   MCH 11/01/2021 31.0  26.0 - 34.0 pg Final   MCHC 11/01/2021 30.2  30.0 - 36.0 g/dL Final   RDW 11/01/2021 18.1 (H)  11.5 - 15.5 % Final   Platelets 11/01/2021 89 (L)  150 - 400 K/uL Final   Comment: Immature Platelet Fraction may be clinically indicated, consider ordering this additional test [id number]    nRBC 11/01/2021 0.0  0.0 - 0.2 % Final   Performed at [facility], [address] [place], [place] [address]   Sodium 11/01/2021 139  135 - 145 mmol/L Final   Potassium 11/01/2021 4.0  3.5 - 5.1 mmol/L Final   Chloride 11/01/2021 108  98 - 111 mmol/L Final   CO2 11/01/2021 26  22 - 32 mmol/L Final   Glucose, Bld 11/01/2021 133 (H)  70 - 99 mg/dL Final   Glucose reference range applies only to samples taken after fasting for at least 8 hours.   BUN 11/01/2021 17  8 - 23 mg/dL Final   Creatinine, Ser 11/01/2021 0.88  0.44 - 1.00 mg/dL Final   Calcium 11/01/2021 9.6  8.9 - 10.3 mg/dL Final   Total Protein 11/01/2021 6.8  6.5 - 8.1 g/dL Final   Albumin 11/01/2021 3.3 (L)  3.5 - 5.0 g/dL Final   AST 11/01/2021 24  15 - 41 U/L Final   ALT 11/01/2021 16  0 - 44 U/L Final   Alkaline Phosphatase 11/01/2021 73  38 - 126 U/L Final   Total Bilirubin 11/01/2021 0.9  0.3 - 1.2 mg/dL Final   GFR, Estimated 11/01/2021 >60  >60 mL/min Final   Comment: (NOTE) Calculated using the CKD-EPI Creatinine Equation (2021)    Anion gap 11/01/2021 5  5 - 15 Final   Performed at [facility], [place], [place] [address]   Color, Urine 11/01/2021 YELLOW (A)  YELLOW Final   APPearance 11/01/2021 HAZY (A)  CLEAR Final   Specific Gravity, Urine 11/01/2021 1.013  1.005 - 1.030 Final   pH 11/01/2021 5.0  5.0 - 8.0 Final   Glucose, UA 11/01/2021 NEGATIVE  NEGATIVE mg/dL Final   Hgb urine dipstick 11/01/2021 NEGATIVE  NEGATIVE Final   Bilirubin Urine 11/01/2021 NEGATIVE  NEGATIVE Final   Ketones, ur 11/01/2021 NEGATIVE  NEGATIVE mg/dL Final    Protein, ur 11/01/2021 30 (A)  NEGATIVE mg/dL Final   Nitrite 11/01/2021 POSITIVE (A)  NEGATIVE Final   Leukocytes,Ua 11/01/2021 MODERATE (A)  NEGATIVE Final   RBC / HPF 11/01/2021 0-5  0 - 5 RBC/hpf Final   WBC, UA 11/01/2021 21-50  0 - 5 WBC/hpf Final   Bacteria, UA 11/01/2021 MANY (A)  NONE SEEN Final   Squamous Epithelial / LPF 11/01/2021 0-5  0 - 5 Final   WBC Clumps 11/01/2021 PRESENT   Final   Mucus 11/01/2021 PRESENT   Final   Performed at [facility], [address], [place], [place] [address]   Specimen Description 11/01/2021    Final                   Value:URINE, CLEAN CATCH Performed at [facility], [address] [place], [place] [address]    Special Requests 11/01/2021    Final                   Value:NONE Performed at [facility], [address] [place], [place] [address]    Culture 11/01/2021 >=100,000 COLONIES/mL ESCHERICHIA COLI (A)   Final   Report Status 11/01/2021 11/03/2021 FINAL   Final   Organism ID, Bacteria 11/01/2021 ESCHERICHIA COLI (A)   Final    ECG: Date: 11/01/2021 Time ECG obtained: 0909 AM Rate: 77 bpm Rhythm:  Normal sinus rhythm with LVH; LBBB Axis (leads I and aVF): Left axis deviation Intervals: PR 200 ms. QRS 164 ms. QTc 511 ms. ST segment and T wave changes: No evidence of acute ST segment elevation or depression Comparison: Previously obtained tracing on 01/15/2021 initiated  sinus rhythm with a first-degree AV block at a rate of 66 bpm.  LBBB present.   IMAGING / PROCEDURES: DIAGNOSTIC RADIOGRAPHS OF RIGHT KNEE 3 VIEWS performed on 10/11/2021 Narrowing of the lateral cartilage space with associated valgus alignment Osteophyte formation is noted Subchondral sclerosis is noted Chondrocalcinosis is present No evidence of fracture or dislocation  MRI ABDOMEN W WO CONTRAST performed on 12/04/2020 There is a macroscopic fat signal lesion of the inferior pole of the left kidney without associated  contrast enhancement, measuring approximately 1.2 cm and corresponding to hyperechoic mass identified by prior ultrasound. This is consistent with a small, benign renal angiomyolipoma. No suspicious renal lesions. Mild left renal cortical scarring and atrophy, suggesting prior infectious or obstructive insult. Moderate hiatal hernia.  MYOCARDIAL PERFUSION IMAGING STUDY (LEXISCAN) performed on 11/05/2020 LVEF 56% Normal myocardial thickening and wall motion No artifacts noted Left ventricular cavity size normal No evidence of stress-induced myocardial ischemia or arrhythmia The overall quality of the study is good  VASCULAR ULTRASOUND ABI WITH/WITHOUT TBI performed on 02/24/2020 Right:  Resting right ankle-brachial index is within normal range.  No evidence of significant right lower extremity arterial disease.  The right toe-brachial index is normal.  Left:  Resting left ankle-brachial index is within normal range.  No evidence of significant left lower extremity arterial disease.  The left toe-brachial index is abnormal.   ABDOMINAL AORTOGRAM WITH LOWER EXTREMITY PERIPHERAL VASCULAR BALLOON ANGIOPLASTY performed on 05/16/2019 Abdominal aorta renal arteries were widely patent.   The infrarenal abdominal aorta was free of significant atherosclerotic changes.   Iliac arteries appeared normal bilaterally.   In the left lower extremity, there was a 60% lesion of the mid LEFT SFA with one-vessel runoff via the peroneal.  The peroneal gave collaterals to the dorsalis pedis at the level of the ankle.   POBA performed resulting in a 0% residual stenosis, no evidence of dissection, and excellent angiographic flow.  Impression and Plan:  Nina Perez has been referred for pre-anesthesia review and clearance prior to her undergoing the planned anesthetic and procedural courses. Available labs, pertinent testing, and imaging results were personally reviewed by me. Patient found to have  pathogenically significant urine culture. Result was sent to her surgeon for treatment prior to her surgery. This patient has been appropriately cleared by cardiology (LOW) and by pulmonary medicine (LOW) with the individually indicated risk of significant perioperative complications.   Based on clinical review performed today (11/07/21), barring any significant acute changes in the patient's overall condition, it is anticipated that she will be able to proceed with the planned surgical intervention. Any acute changes in clinical condition may necessitate her procedure being postponed and/or cancelled. Patient will meet with anesthesia team (MD and/or CRNA) on the day of her procedure for preoperative evaluation/assessment. Questions regarding anesthetic course will be fielded at that time.   Pre-surgical instructions were reviewed with the patient during her PAT appointment and questions were fielded by PAT clinical staff. Patient was advised that if any questions or concerns arise prior to her procedure then she should return a call to PAT and/or her surgeon's office to discuss.  Honor Loh, MSN, APRN, FNP-C, CEN [facility]  Peri-operative Services Nurse Practitioner Phone: [phone] Fax: [phone] 11/07/21 12:03 PM  NOTE: This note has been prepared using Dragon dictation software. Despite my best ability to proofread, there is always the potential that unintentional transcriptional errors may still occur from this process.

## 2021-11-12 MED ORDER — CHLORHEXIDINE GLUCONATE 0.12 % MT SOLN: 15.0000 mL | Freq: Once | OROMUCOSAL | Status: AC

## 2021-11-12 MED ORDER — TRANEXAMIC ACID-NACL 1000-0.7 MG/100ML-% IV SOLN: 1000.0000 mg | INTRAVENOUS | Status: DC

## 2021-11-12 MED ORDER — CEFAZOLIN SODIUM-DEXTROSE 2-4 GM/100ML-% IV SOLN
2.0000 g | INTRAVENOUS | Status: AC
  Administered 2021-11-13: 2 g via INTRAVENOUS

## 2021-11-12 MED ORDER — CHLORHEXIDINE GLUCONATE 4 % EX LIQD
60.0000 mL | Freq: Once | CUTANEOUS | Status: AC
  Administered 2021-11-13: 4 via TOPICAL

## 2021-11-12 MED ORDER — ORAL CARE MOUTH RINSE: 15.0000 mL | Freq: Once | OROMUCOSAL | Status: AC

## 2021-11-12 MED ORDER — DEXAMETHASONE SODIUM PHOSPHATE 10 MG/ML IJ SOLN: 8.0000 mg | Freq: Once | INTRAMUSCULAR | Status: AC

## 2021-11-12 MED ORDER — LACTATED RINGERS IV SOLN: INTRAVENOUS | Status: DC

## 2021-11-12 MED ORDER — GABAPENTIN 300 MG PO CAPS: 300.0000 mg | ORAL_CAPSULE | Freq: Once | ORAL | Status: AC

## 2021-11-12 MED ORDER — CELECOXIB 200 MG PO CAPS: 400.0000 mg | ORAL_CAPSULE | Freq: Once | ORAL | Status: AC

## 2021-11-13 ENCOUNTER — Observation Stay: Payer: Medicare Other

## 2021-11-13 ENCOUNTER — Ambulatory Visit: Payer: Medicare Other | Admitting: Urgent Care

## 2021-11-13 ENCOUNTER — Encounter: Payer: Self-pay | Admitting: Orthopedic Surgery

## 2021-11-13 ENCOUNTER — Other Ambulatory Visit: Payer: Self-pay

## 2021-11-13 ENCOUNTER — Observation Stay
Admission: RE | Admit: 2021-11-13 | Discharge: 2021-11-14 | Disposition: A | Discharge status: Home Health | Payer: Medicare Other | Attending: Orthopedic Surgery | Admitting: Orthopedic Surgery

## 2021-11-13 ENCOUNTER — Encounter
Admission: RE | Disposition: A | Discharge status: Home Health | Payer: Self-pay | Source: Home / Self Care | Attending: Orthopedic Surgery

## 2021-11-13 DIAGNOSIS — Z7982 Long term (current) use of aspirin: Secondary | ICD-10-CM | POA: Diagnosis not present

## 2021-11-13 DIAGNOSIS — Z7984 Long term (current) use of oral hypoglycemic drugs: Secondary | ICD-10-CM | POA: Diagnosis not present

## 2021-11-13 DIAGNOSIS — Z79899 Other long term (current) drug therapy: Secondary | ICD-10-CM | POA: Diagnosis not present

## 2021-11-13 DIAGNOSIS — Z87891 Personal history of nicotine dependence: Secondary | ICD-10-CM | POA: Diagnosis not present

## 2021-11-13 DIAGNOSIS — I129 Hypertensive chronic kidney disease with stage 1 through stage 4 chronic kidney disease, or unspecified chronic kidney disease: Secondary | ICD-10-CM | POA: Diagnosis not present

## 2021-11-13 DIAGNOSIS — Z7902 Long term (current) use of antithrombotics/antiplatelets: Secondary | ICD-10-CM | POA: Insufficient documentation

## 2021-11-13 DIAGNOSIS — R300 Dysuria: Secondary | ICD-10-CM

## 2021-11-13 DIAGNOSIS — Z01812 Encounter for preprocedural laboratory examination: Secondary | ICD-10-CM

## 2021-11-13 DIAGNOSIS — M1711 Unilateral primary osteoarthritis, right knee: Secondary | ICD-10-CM | POA: Diagnosis present

## 2021-11-13 DIAGNOSIS — N183 Chronic kidney disease, stage 3 unspecified: Secondary | ICD-10-CM | POA: Insufficient documentation

## 2021-11-13 DIAGNOSIS — E1122 Type 2 diabetes mellitus with diabetic chronic kidney disease: Secondary | ICD-10-CM | POA: Diagnosis not present

## 2021-11-13 DIAGNOSIS — D696 Thrombocytopenia, unspecified: Secondary | ICD-10-CM

## 2021-11-13 DIAGNOSIS — I272 Pulmonary hypertension, unspecified: Secondary | ICD-10-CM

## 2021-11-13 DIAGNOSIS — N1831 Chronic kidney disease, stage 3a: Secondary | ICD-10-CM

## 2021-11-13 DIAGNOSIS — E1159 Type 2 diabetes mellitus with other circulatory complications: Secondary | ICD-10-CM

## 2021-11-13 DIAGNOSIS — Z85828 Personal history of other malignant neoplasm of skin: Secondary | ICD-10-CM | POA: Insufficient documentation

## 2021-11-13 DIAGNOSIS — Z8616 Personal history of COVID-19: Secondary | ICD-10-CM | POA: Insufficient documentation

## 2021-11-13 DIAGNOSIS — Z8719 Personal history of other diseases of the digestive system: Secondary | ICD-10-CM | POA: Insufficient documentation

## 2021-11-13 DIAGNOSIS — M341 CR(E)ST syndrome: Secondary | ICD-10-CM

## 2021-11-13 DIAGNOSIS — Z96659 Presence of unspecified artificial knee joint: Secondary | ICD-10-CM

## 2021-11-13 DIAGNOSIS — M069 Rheumatoid arthritis, unspecified: Secondary | ICD-10-CM

## 2021-11-13 HISTORY — PX: KNEE ARTHROPLASTY: SHX992

## 2021-11-13 HISTORY — DX: Diaphragmatic hernia without obstruction or gangrene: K44.9

## 2021-11-13 HISTORY — DX: Benign neoplasm of colon, unspecified: D12.6

## 2021-11-13 LAB — GLUCOSE, CAPILLARY
Glucose-Capillary: 84 mg/dL (ref 70–99)
Glucose-Capillary: 118 mg/dL — ABNORMAL HIGH (ref 70–99)
Glucose-Capillary: 199 mg/dL — ABNORMAL HIGH (ref 70–99)

## 2021-11-13 SURGERY — ARTHROPLASTY, KNEE, TOTAL, USING IMAGELESS COMPUTER-ASSISTED NAVIGATION
Anesthesia: Spinal | Site: Knee | Laterality: Right

## 2021-11-13 MED ORDER — TORSEMIDE 20 MG PO TABS: 20.0000 mg | ORAL_TABLET | ORAL | Status: DC | PRN

## 2021-11-13 MED ORDER — PROPOFOL 10 MG/ML IV BOLUS
INTRAVENOUS | Status: DC | PRN
  Administered 2021-11-13: 40 mg via INTRAVENOUS

## 2021-11-13 MED ORDER — 0.9 % SODIUM CHLORIDE (POUR BTL) OPTIME
TOPICAL | Status: DC | PRN
  Administered 2021-11-13: 500 mL

## 2021-11-13 MED ORDER — METHOTREXATE 2.5 MG PO TABS
10.0000 mg | ORAL_TABLET | ORAL | Status: DC
  Administered 2021-11-13: 10 mg via ORAL
  Filled 2021-11-13: qty 4

## 2021-11-13 MED ORDER — DIPHENHYDRAMINE HCL 12.5 MG/5ML PO ELIX: 12.5000 mg | ORAL_SOLUTION | ORAL | Status: DC | PRN

## 2021-11-13 MED ORDER — FOLIC ACID 1 MG PO TABS
1.0000 mg | ORAL_TABLET | Freq: Every evening | ORAL | Status: DC
  Administered 2021-11-13 – 2021-11-14 (×2): 1 mg via ORAL
  Filled 2021-11-13 (×2): qty 1

## 2021-11-13 MED ORDER — CLOPIDOGREL BISULFATE 75 MG PO TABS
75.0000 mg | ORAL_TABLET | Freq: Every day | ORAL | Status: DC
Start: 2021-11-14 — End: 2021-11-14
  Administered 2021-11-14: 75 mg via ORAL
  Filled 2021-11-13: qty 1

## 2021-11-13 MED ORDER — ALLOPURINOL 300 MG PO TABS
300.0000 mg | ORAL_TABLET | Freq: Every day | ORAL | Status: DC
  Administered 2021-11-14: 300 mg via ORAL
  Filled 2021-11-13: qty 1

## 2021-11-13 MED ORDER — TRIAMCINOLONE ACETONIDE 0.1 % EX CREA
1.0000 | TOPICAL_CREAM | Freq: Two times a day (BID) | CUTANEOUS | Status: DC
Start: 2021-11-13 — End: 2021-11-14
  Administered 2021-11-14: 1 via TOPICAL
  Filled 2021-11-13: qty 15

## 2021-11-13 MED ORDER — SODIUM CHLORIDE 0.9 % IV SOLN: INTRAVENOUS | Status: DC

## 2021-11-13 MED ORDER — OXYCODONE HCL 5 MG PO TABS
10.0000 mg | ORAL_TABLET | ORAL | Status: DC | PRN
  Administered 2021-11-13 – 2021-11-14 (×2): 10 mg via ORAL
  Filled 2021-11-13 (×2): qty 2

## 2021-11-13 MED ORDER — ONDANSETRON HCL 4 MG PO TABS: 4.0000 mg | ORAL_TABLET | Freq: Four times a day (QID) | ORAL | Status: DC | PRN

## 2021-11-13 MED ORDER — SEVOFLURANE IN SOLN
RESPIRATORY_TRACT | Status: AC
  Filled 2021-11-13: qty 250

## 2021-11-13 MED ORDER — TRANEXAMIC ACID-NACL 1000-0.7 MG/100ML-% IV SOLN
INTRAVENOUS | Status: AC
  Administered 2021-11-13: 1000 mg via INTRAVENOUS
  Filled 2021-11-13: qty 100

## 2021-11-13 MED ORDER — CEFAZOLIN SODIUM-DEXTROSE 2-4 GM/100ML-% IV SOLN
2.0000 g | Freq: Four times a day (QID) | INTRAVENOUS | Status: AC
  Administered 2021-11-14: 2 g via INTRAVENOUS
  Filled 2021-11-13 (×2): qty 100

## 2021-11-13 MED ORDER — PROPOFOL 500 MG/50ML IV EMUL
INTRAVENOUS | Status: DC | PRN
  Administered 2021-11-13: 200 ug/kg/min via INTRAVENOUS

## 2021-11-13 MED ORDER — FERROUS SULFATE 325 (65 FE) MG PO TABS
325.0000 mg | ORAL_TABLET | Freq: Two times a day (BID) | ORAL | Status: DC
  Administered 2021-11-14 (×2): 325 mg via ORAL
  Filled 2021-11-13 (×2): qty 1

## 2021-11-13 MED ORDER — ONDANSETRON HCL 4 MG/2ML IJ SOLN
INTRAMUSCULAR | Status: DC | PRN
  Administered 2021-11-13: 4 mg via INTRAVENOUS

## 2021-11-13 MED ORDER — TRANEXAMIC ACID-NACL 1000-0.7 MG/100ML-% IV SOLN
INTRAVENOUS | Status: DC | PRN
  Administered 2021-11-13: 1000 mg via INTRAVENOUS

## 2021-11-13 MED ORDER — MENTHOL 3 MG MT LOZG: 1.0000 | LOZENGE | OROMUCOSAL | Status: DC | PRN

## 2021-11-13 MED ORDER — SENNOSIDES-DOCUSATE SODIUM 8.6-50 MG PO TABS
1.0000 | ORAL_TABLET | Freq: Two times a day (BID) | ORAL | Status: DC
  Administered 2021-11-13 – 2021-11-14 (×2): 1 via ORAL
  Filled 2021-11-13 (×2): qty 1

## 2021-11-13 MED ORDER — PANTOPRAZOLE SODIUM 40 MG PO TBEC
40.0000 mg | DELAYED_RELEASE_TABLET | Freq: Two times a day (BID) | ORAL | Status: DC
  Administered 2021-11-13 – 2021-11-14 (×2): 40 mg via ORAL
  Filled 2021-11-13 (×2): qty 1

## 2021-11-13 MED ORDER — HYDROMORPHONE HCL 1 MG/ML IJ SOLN: 0.5000 mg | INTRAMUSCULAR | Status: DC | PRN

## 2021-11-13 MED ORDER — OXYCODONE HCL 5 MG PO TABS: 5.0000 mg | ORAL_TABLET | ORAL | Status: DC | PRN

## 2021-11-13 MED ORDER — CHLORHEXIDINE GLUCONATE 0.12 % MT SOLN
OROMUCOSAL | Status: AC
  Administered 2021-11-13: 15 mL via OROMUCOSAL
  Filled 2021-11-13: qty 15

## 2021-11-13 MED ORDER — TRANEXAMIC ACID-NACL 1000-0.7 MG/100ML-% IV SOLN: 1000.0000 mg | Freq: Once | INTRAVENOUS | Status: AC

## 2021-11-13 MED ORDER — SURGIPHOR WOUND IRRIGATION SYSTEM - OPTIME
TOPICAL | Status: DC | PRN
  Administered 2021-11-13: 1

## 2021-11-13 MED ORDER — CELECOXIB 200 MG PO CAPS
ORAL_CAPSULE | ORAL | Status: AC
  Administered 2021-11-13: 400 mg via ORAL
  Filled 2021-11-13: qty 2

## 2021-11-13 MED ORDER — LOSARTAN POTASSIUM 50 MG PO TABS
100.0000 mg | ORAL_TABLET | Freq: Every day | ORAL | Status: DC
  Administered 2021-11-13 – 2021-11-14 (×2): 100 mg via ORAL
  Filled 2021-11-13 (×2): qty 2

## 2021-11-13 MED ORDER — LORATADINE 10 MG PO TABS
10.0000 mg | ORAL_TABLET | Freq: Every day | ORAL | Status: DC
  Administered 2021-11-14: 10 mg via ORAL
  Filled 2021-11-13: qty 1

## 2021-11-13 MED ORDER — PHENYLEPHRINE HCL-NACL 20-0.9 MG/250ML-% IV SOLN
INTRAVENOUS | Status: DC | PRN
  Administered 2021-11-13: 25 ug/min via INTRAVENOUS

## 2021-11-13 MED ORDER — IPRATROPIUM-ALBUTEROL 0.5-2.5 (3) MG/3ML IN SOLN: 3.0000 mL | Freq: Once | RESPIRATORY_TRACT | Status: AC

## 2021-11-13 MED ORDER — ONDANSETRON HCL 4 MG/2ML IJ SOLN: 4.0000 mg | Freq: Four times a day (QID) | INTRAMUSCULAR | Status: DC | PRN

## 2021-11-13 MED ORDER — CEFAZOLIN SODIUM-DEXTROSE 2-4 GM/100ML-% IV SOLN
INTRAVENOUS | Status: AC
  Administered 2021-11-13: 2 g via INTRAVENOUS
  Filled 2021-11-13: qty 100

## 2021-11-13 MED ORDER — ASPIRIN 81 MG PO CHEW
81.0000 mg | CHEWABLE_TABLET | Freq: Two times a day (BID) | ORAL | Status: DC
  Administered 2021-11-13 – 2021-11-14 (×2): 81 mg via ORAL
  Filled 2021-11-13 (×2): qty 1

## 2021-11-13 MED ORDER — TRANEXAMIC ACID-NACL 1000-0.7 MG/100ML-% IV SOLN
INTRAVENOUS | Status: AC
  Filled 2021-11-13: qty 100

## 2021-11-13 MED ORDER — ATORVASTATIN CALCIUM 20 MG PO TABS
40.0000 mg | ORAL_TABLET | Freq: Every day | ORAL | Status: DC
  Administered 2021-11-14: 40 mg via ORAL
  Filled 2021-11-13: qty 2

## 2021-11-13 MED ORDER — METFORMIN HCL 500 MG PO TABS
500.0000 mg | ORAL_TABLET | Freq: Every day | ORAL | Status: DC
  Administered 2021-11-14: 500 mg via ORAL
  Filled 2021-11-13: qty 1

## 2021-11-13 MED ORDER — ACETAMINOPHEN 10 MG/ML IV SOLN
INTRAVENOUS | Status: DC | PRN
  Administered 2021-11-13: 1000 mg via INTRAVENOUS

## 2021-11-13 MED ORDER — TRAMADOL HCL 50 MG PO TABS
50.0000 mg | ORAL_TABLET | ORAL | Status: DC | PRN
  Administered 2021-11-14: 100 mg via ORAL
  Administered 2021-11-14: 50 mg via ORAL
  Filled 2021-11-13: qty 2
  Filled 2021-11-13 (×2): qty 1

## 2021-11-13 MED ORDER — DEXAMETHASONE SODIUM PHOSPHATE 10 MG/ML IJ SOLN
INTRAMUSCULAR | Status: AC
  Administered 2021-11-13: 8 mg via INTRAVENOUS
  Filled 2021-11-13: qty 1

## 2021-11-13 MED ORDER — SODIUM CHLORIDE (PF) 0.9 % IJ SOLN
INTRAMUSCULAR | Status: DC | PRN
  Administered 2021-11-13: 120 mL via INTRAMUSCULAR

## 2021-11-13 MED ORDER — BISACODYL 10 MG RE SUPP: 10.0000 mg | Freq: Every day | RECTAL | Status: DC | PRN

## 2021-11-13 MED ORDER — GABAPENTIN 300 MG PO CAPS
ORAL_CAPSULE | ORAL | Status: AC
  Administered 2021-11-13: 300 mg via ORAL
  Filled 2021-11-13: qty 1

## 2021-11-13 MED ORDER — METOPROLOL SUCCINATE ER 50 MG PO TB24
50.0000 mg | ORAL_TABLET | Freq: Every day | ORAL | Status: DC
  Administered 2021-11-14: 50 mg via ORAL
  Filled 2021-11-13: qty 1

## 2021-11-13 MED ORDER — PROPOFOL 1000 MG/100ML IV EMUL
INTRAVENOUS | Status: AC
  Filled 2021-11-13: qty 100

## 2021-11-13 MED ORDER — PHENOL 1.4 % MT LIQD: 1.0000 | OROMUCOSAL | Status: DC | PRN

## 2021-11-13 MED ORDER — SODIUM CHLORIDE 0.9 % IR SOLN
Status: DC | PRN
  Administered 2021-11-13: 3000 mL

## 2021-11-13 MED ORDER — IPRATROPIUM BROMIDE 0.06 % NA SOLN
2.0000 | Freq: Four times a day (QID) | NASAL | Status: DC
  Filled 2021-11-13: qty 15

## 2021-11-13 MED ORDER — FLEET ENEMA 7-19 GM/118ML RE ENEM: 1.0000 | ENEMA | Freq: Once | RECTAL | Status: DC | PRN

## 2021-11-13 MED ORDER — CELECOXIB 200 MG PO CAPS
200.0000 mg | ORAL_CAPSULE | Freq: Two times a day (BID) | ORAL | Status: DC
  Administered 2021-11-13 – 2021-11-14 (×2): 200 mg via ORAL
  Filled 2021-11-13 (×2): qty 1

## 2021-11-13 MED ORDER — ACETAMINOPHEN 325 MG PO TABS
325.0000 mg | ORAL_TABLET | Freq: Four times a day (QID) | ORAL | Status: DC | PRN
  Administered 2021-11-14: 650 mg via ORAL
  Filled 2021-11-13: qty 2

## 2021-11-13 MED ORDER — INSULIN ASPART 100 UNIT/ML IJ SOLN
0.0000 [IU] | Freq: Three times a day (TID) | INTRAMUSCULAR | Status: DC
  Administered 2021-11-14: 2 [IU] via SUBCUTANEOUS
  Filled 2021-11-13: qty 1

## 2021-11-13 MED ORDER — ONDANSETRON HCL 4 MG/2ML IJ SOLN: 4.0000 mg | Freq: Once | INTRAMUSCULAR | Status: DC | PRN

## 2021-11-13 MED ORDER — FENTANYL CITRATE (PF) 100 MCG/2ML IJ SOLN: 25.0000 ug | INTRAMUSCULAR | Status: DC | PRN

## 2021-11-13 MED ORDER — IPRATROPIUM-ALBUTEROL 0.5-2.5 (3) MG/3ML IN SOLN
RESPIRATORY_TRACT | Status: AC
  Administered 2021-11-13: 3 mL via RESPIRATORY_TRACT
  Filled 2021-11-13: qty 3

## 2021-11-13 MED ORDER — ACETAMINOPHEN 10 MG/ML IV SOLN
1000.0000 mg | Freq: Four times a day (QID) | INTRAVENOUS | Status: DC
  Administered 2021-11-13 – 2021-11-14 (×2): 1000 mg via INTRAVENOUS
  Filled 2021-11-13 (×4): qty 100

## 2021-11-13 MED ORDER — MAGNESIUM HYDROXIDE 400 MG/5ML PO SUSP
30.0000 mL | Freq: Every day | ORAL | Status: DC
  Administered 2021-11-13 – 2021-11-14 (×2): 30 mL via ORAL
  Filled 2021-11-13 (×2): qty 30

## 2021-11-13 MED ORDER — INSULIN ASPART 100 UNIT/ML IJ SOLN: 0.0000 [IU] | Freq: Every day | INTRAMUSCULAR | Status: DC

## 2021-11-13 MED ORDER — SODIUM CHLORIDE 0.9 % IV SOLN: INTRAVENOUS | Status: DC | PRN

## 2021-11-13 MED ORDER — PROPOFOL 10 MG/ML IV BOLUS
INTRAVENOUS | Status: AC
  Filled 2021-11-13: qty 20

## 2021-11-13 MED ORDER — METOCLOPRAMIDE HCL 5 MG PO TABS
10.0000 mg | ORAL_TABLET | Freq: Three times a day (TID) | ORAL | Status: DC
  Administered 2021-11-13 – 2021-11-14 (×4): 10 mg via ORAL
  Filled 2021-11-13 (×4): qty 2

## 2021-11-13 MED ORDER — ALUM & MAG HYDROXIDE-SIMETH 200-200-20 MG/5ML PO SUSP
30.0000 mL | ORAL | Status: DC | PRN
Start: 2021-11-13 — End: 2021-11-14

## 2021-11-13 SURGICAL SUPPLY — 76 items
ATTUNE MED DOME PAT 32 KNEE (Knees) ×1 IMPLANT
ATTUNE PS FEM RT SZ 4 CEM KNEE (Femur) ×1 IMPLANT
ATTUNE PSRP INSR SZ4 8 KNEE (Insert) ×1 IMPLANT
BASE TIBIAL ROT PLAT SZ 5 KNEE (Knees) IMPLANT
BATTERY INSTRU NAVIGATION (MISCELLANEOUS) ×8 IMPLANT
BLADE CLIPPER SURG (BLADE) ×1 IMPLANT
BLADE SAW 70X12.5 (BLADE) ×2 IMPLANT
BLADE SAW 90X13X1.19 OSCILLAT (BLADE) ×2 IMPLANT
BLADE SAW 90X25X1.19 OSCILLAT (BLADE) ×2 IMPLANT
BONE CEMENT GENTAMICIN (Cement) ×4 IMPLANT
CEMENT BONE GENTAMICIN 40 (Cement) IMPLANT
COOLER POLAR GLACIER W/PUMP (MISCELLANEOUS) ×2 IMPLANT
CUFF TOURN SGL QUICK 24 (TOURNIQUET CUFF)
CUFF TOURN SGL QUICK 34 (TOURNIQUET CUFF)
CUFF TRNQT CYL 24X4X16.5-23 (TOURNIQUET CUFF) IMPLANT
CUFF TRNQT CYL 34X4.125X (TOURNIQUET CUFF) IMPLANT
DRAPE 3/4 80X56 (DRAPES) ×2 IMPLANT
DRAPE INCISE IOBAN 66X45 STRL (DRAPES) IMPLANT
DRSG DERMACEA NONADH 3X8 (GAUZE/BANDAGES/DRESSINGS) ×2 IMPLANT
DRSG MEPILEX SACRM 8.7X9.8 (GAUZE/BANDAGES/DRESSINGS) ×2 IMPLANT
DRSG OPSITE POSTOP 4X14 (GAUZE/BANDAGES/DRESSINGS) ×2 IMPLANT
DRSG TEGADERM 4X4.75 (GAUZE/BANDAGES/DRESSINGS) ×2 IMPLANT
DURAPREP 26ML APPLICATOR (WOUND CARE) ×4 IMPLANT
ELECT CAUTERY BLADE 6.4 (BLADE) ×2 IMPLANT
ELECT REM PT RETURN 9FT ADLT (ELECTROSURGICAL) ×2
ELECTRODE REM PT RTRN 9FT ADLT (ELECTROSURGICAL) ×1 IMPLANT
EX-PIN ORTHOLOCK NAV 4X150 (PIN) ×4 IMPLANT
GLOVE BIOGEL M STRL SZ7.5 (GLOVE) ×8 IMPLANT
GLOVE BIOGEL PI IND STRL 8 (GLOVE) ×1 IMPLANT
GLOVE BIOGEL PI INDICATOR 8 (GLOVE) ×1
GLOVE SURG UNDER POLY LF SZ7.5 (GLOVE) ×2 IMPLANT
GOWN STRL REUS W/ TWL LRG LVL3 (GOWN DISPOSABLE) ×2 IMPLANT
GOWN STRL REUS W/ TWL XL LVL3 (GOWN DISPOSABLE) ×1 IMPLANT
GOWN STRL REUS W/TWL LRG LVL3 (GOWN DISPOSABLE) ×2
GOWN STRL REUS W/TWL XL LVL3 (GOWN DISPOSABLE) ×1
HEMOVAC 400CC 10FR (MISCELLANEOUS) ×1 IMPLANT
HOLDER FOLEY CATH W/STRAP (MISCELLANEOUS) ×2 IMPLANT
HOLSTER ELECTROSUGICAL PENCIL (MISCELLANEOUS) ×1 IMPLANT
HOOD PEEL AWAY FLYTE STAYCOOL (MISCELLANEOUS) ×4 IMPLANT
IV NS IRRIG 3000ML ARTHROMATIC (IV SOLUTION) ×2 IMPLANT
KIT TURNOVER KIT A (KITS) ×2 IMPLANT
KNIFE SCULPS 14X20 (INSTRUMENTS) ×2 IMPLANT
MANIFOLD NEPTUNE II (INSTRUMENTS) ×4 IMPLANT
NDL SPNL 20GX3.5 QUINCKE YW (NEEDLE) ×2 IMPLANT
NEEDLE SPNL 20GX3.5 QUINCKE YW (NEEDLE) ×4 IMPLANT
NS IRRIG 500ML POUR BTL (IV SOLUTION) ×2 IMPLANT
PACK TOTAL KNEE (MISCELLANEOUS) ×2 IMPLANT
PAD ABD DERMACEA PRESS 5X9 (GAUZE/BANDAGES/DRESSINGS) ×4 IMPLANT
PAD WRAPON POLAR KNEE (MISCELLANEOUS) ×1 IMPLANT
PAD WRAPON POLOR MULTI XL (MISCELLANEOUS) IMPLANT
PIN DRILL FIX HALF THREAD (BIT) ×4 IMPLANT
PIN FIXATION 1/8DIA X 3INL (PIN) ×2 IMPLANT
PULSAVAC PLUS IRRIG FAN TIP (DISPOSABLE) ×2
SOL PREP PVP 2OZ (MISCELLANEOUS) ×2
SOLUTION IRRIG SURGIPHOR (IV SOLUTION) ×2 IMPLANT
SOLUTION PREP PVP 2OZ (MISCELLANEOUS) ×1 IMPLANT
SPONGE DRAIN TRACH 4X4 STRL 2S (GAUZE/BANDAGES/DRESSINGS) ×2 IMPLANT
STAPLER SKIN PROX 35W (STAPLE) ×2 IMPLANT
STOCKINETTE IMPERV 14X48 (MISCELLANEOUS) ×1 IMPLANT
STRAP TIBIA SHORT (MISCELLANEOUS) ×2 IMPLANT
SUCTION FRAZIER HANDLE 10FR (MISCELLANEOUS) ×1
SUCTION TUBE FRAZIER 10FR DISP (MISCELLANEOUS) ×1 IMPLANT
SUT VIC AB 0 CT1 36 (SUTURE) ×4 IMPLANT
SUT VIC AB 1 CT1 36 (SUTURE) ×4 IMPLANT
SUT VIC AB 2-0 CT2 27 (SUTURE) ×2 IMPLANT
SYR 30ML LL (SYRINGE) ×4 IMPLANT
TIBIAL BASE ROT PLAT SZ 5 KNEE (Knees) ×2 IMPLANT
TIP FAN IRRIG PULSAVAC PLUS (DISPOSABLE) ×1 IMPLANT
TOWEL OR 17X26 4PK STRL BLUE (TOWEL DISPOSABLE) IMPLANT
TOWER CARTRIDGE SMART MIX (DISPOSABLE) ×2 IMPLANT
TRAY FOLEY MTR SLVR 16FR STAT (SET/KITS/TRAYS/PACK) ×2 IMPLANT
WATER STERILE IRR 1000ML POUR (IV SOLUTION) ×1 IMPLANT
WATER STERILE IRR 500ML POUR (IV SOLUTION) ×1 IMPLANT
WRAP-ON POLOR PAD MULTI XL (MISCELLANEOUS) ×1
WRAPON POLAR PAD KNEE (MISCELLANEOUS)
WRAPON POLOR PAD MULTI XL (MISCELLANEOUS) ×1

## 2021-11-13 NOTE — H&P (Signed)
ORTHOPAEDIC HISTORY & PHYSICAL Gwenlyn Fudge, [place] - 10/30/2021 1:15 PM EDT Formatting of this note is different from the original. [place] Chief Complaint:   Chief Complaint  Patient presents with  Knee Pain  H & P RIGHT KNEE   History of Present Illness:   Nina Perez is a 81 y.o. female that presents to clinic today for her preoperative history and evaluation. Patient presents with her husband. The patient is scheduled to undergo a right total knee arthroplasty on 11/13/21 by Dr. Marry Guan. Her pain began many years ago. The pain is located primarily along the lateral aspect of the knee. She describes her pain as worse with any weightbearing. She reports associated mild swelling, some locking. She denies associated numbness or tingling, denies giving way of the knee.   The patient's symptoms have progressed to the point that they decrease her quality of life. The patient has previously undergone conservative treatment including NSAIDS and injections to the knee without adequate control of her symptoms. Previous surgery was canceled due to cellulitis of the left lower extremity.  Patient has been cleared by cardiology. Denies history of lumbar surgery, reports DVT after giving birth, otherwise no issues. Patient is followed by pulmonology for pulmonary fibrosis/pulmonary hypertension. Patient occasionally uses supplemental oxygen while at home, but presents today without it.  Reported rash with use of Keflex for UTI. Also of note, patient has history of rheumatoid arthritis-scleroderma overlap syndrome.  Past Medical, Surgical, Family, Social History, Allergies, Medications:   Past Medical History:  Past Medical History:  Diagnosis Date  Adenomatous colon polyp  Chicken pox  COVID-19 08/2020  Diabetes mellitus type 2, uncomplicated (CMS-HCC)  GERD (gastroesophageal reflux disease)  Gout  History of Barrett's esophagus   Hypercholesterolemia  Hypertension  Obesity  Osteoarthritis ([place]) 02/27/2014  a. Knees. b. Spine.  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 Raynaud's. b. Sclerodactyly, telangiectasias c. Positive rheumatoid factor, FANA, RNP. Negative anti-CCP antibody and anti-DNA. Negative anti-SCL 70. d. Inflammatory arthritis. e. Mild pulmonary fibrosis f. Pulmonary hypertension   Past Surgical History:  Past Surgical History:  Procedure Laterality Date  COLONOSCOPY 08/20/2004  Adenomatous Polyp  COLONOSCOPY 09/26/2021  Multiple 1 mm polyps at the recto-sigmoid colon/PHx CP/No repeat/If needed can refer for CT Colonography/SMR  EGD 09/26/2021  Barrett's Esophagus/Repeat 7yr/SMR  COLONOSCOPY 05/14/2012, 02/02/2008  PH Adenomatous Polyp: [facility] 05/2017; Recall Ltr mailed 04/16/2017 (dw)  EGD 05/14/2012, 02/02/2008, 08/20/2004, 01/15/1999  Barrett's Esophagus: [facility] 05/2015; Recall Ltr mailed 04/13/2015 (dw); 2nd Recall Ltr mailed 04/16/2017 (dw)  LEFT FOURTH TOE AMPUTATION Left  VASCULAR SURGERY BILATERAL LEG  WISDOM TEETH   Current Medications:  Current Outpatient Medications  Medication Sig Dispense Refill  acetaminophen (TYLENOL) 325 MG tablet Take 650 mg by mouth every 4 (four) hours as needed for Pain.  allopurinol (ZYLOPRIM) 300 MG tablet Take 300 mg by mouth once daily.  aspirin 81 MG chewable tablet Take 81 mg by mouth once daily.  atorvastatin (LIPITOR) 40 MG tablet Take 40 mg by mouth once daily  clopidogreL (PLAVIX) 75 mg tablet Take 1 tablet by mouth once daily with breakfast 90 tablet 3  fexofenadine (ALLEGRA) 60 MG tablet Take 60 mg by mouth 2 (two) times daily  folic acid (FOLVITE) 1 MG tablet Take 1 tablet (1,000 mcg total) by mouth once daily 90 tablet 1  ipratropium (ATROVENT) 21 mcg (0.03 %)  nasal spray Place 2 sprays into both nostrils 2 (two) times daily   losartan (COZAAR) 100 MG tablet Take 100 mg by mouth once daily  metFORMIN (GLUCOPHAGE) 500 MG tablet Take 500 mg by mouth once daily.  methotrexate (RHEUMATREX) 2.5 MG tablet TAKE 4 TABLETS BY MOUTH EVERY 7 DAYS 16 tablet 5  metoprolol succinate (TOPROL-XL) 50 MG XL tablet Take 50 mg by mouth once daily.  pantoprazole (PROTONIX) 40 MG DR tablet Take 40 mg by mouth once daily  TORsemide (DEMADEX) 20 MG tablet Take 0.5 tablets (10 mg total) by mouth once daily 15 tablet 11  miscellaneous medical supply Misc INCENTIVE SPIROMETER - USE AS DIRECTED (Patient not taking: Reported on 10/30/2021) 1 each 1   No current facility-administered medications for this visit.   Allergies:  Allergies  Allergen Reactions  Adhesive Tape-Silicones Other (See Comments)  Ob band aid adhesive  Rubbing Alcohol (Ethanol) [Ethanol (Ethyl Alcohol)] Other (See Comments)  REDNESS AND BLISTERS  Cephalexin Rash  Blotchiness to B lower legs after several days of cephalexin for UTI  Maxzide [Triamterene-Hydrochlorothiazid] Other (See Comments)  Extreme fatigue/weakness   Social History:  Social History   Socioeconomic History  Marital status: Married  Spouse name: [name]  Number of children: 2  Years of education: 3  Occupational History  Occupation: Retired  Tobacco Use  Smoking status: Former  Packs/day: 0.75  Years: 29.00  Pack years: 21.75  Types: Cigarettes  Quit date: 1990  Years since quitting: 33.4  Smokeless tobacco: Never  Vaping Use  Vaping Use: Never used  Substance and Sexual Activity  Alcohol use: No  Drug use: Never  Sexual activity: Defer  Partners: Male   Family History:  Family History  Problem Relation Age of Onset  Myocardial Infarction (Heart attack) Father  Myocardial Infarction (Heart attack) Mother  Arthritis Mother  No Known Problems Sister  No Known Problems Brother   Review of Systems:   A 10+ ROS was performed, reviewed, and the pertinent orthopaedic findings  are documented in the HPI.   Physical Examination:   BP (!) 150/90 (BP Location: Left upper arm, Patient Position: Sitting, BP Cuff Size: Large Adult)  Ht 167.6 cm ('5\' 6"'$ )  Wt 98.3 kg (216 lb 12.8 oz)  BMI 34.99 kg/m   Patient is a well-developed, well-nourished female in no acute distress. Patient has normal mood and affect. Patient is alert and oriented to person, place, and time.   HEENT: Atraumatic, normocephalic. Pupils equal and reactive to light. Extraocular motion intact. Noninjected sclera.  Cardiovascular: Regular rate and rhythm, with no murmurs, rubs, or gallops. Distal pulses auscultated with Doppler due to presence of pitting edema.  Respiratory: Lungs clear to auscultation bilaterally.   Right Knee: Soft tissue swelling: minimal Effusion: minimal Erythema: none Crepitance: moderate Tenderness: lateral Alignment: relative valgus Mediolateral laxity: lateral pseudolaxity Posterior sag: negative Patellar tracking: Good tracking without evidence of subluxation or tilt Atrophy: Generalized quadriceps atrophy.  Quadriceps tone was fair. Range of motion: 0/3/120 degrees  Patient able to dorsiflex and plantarflex the right ankle. Able to flex and extend the toes.  Pitting edema noted over the right foot.   Sensation intact over the saphenous, lateral sural cutaneous, superficial fibular, and deep fibular nerve distributions.  Tests Performed/Reviewed:  X-rays  X-ray knee right 3 views  3 views of the right knee were reviewed. There is narrowing of the lateral cartilage space with associated valgus alignment. Osteophyte formation is noted. Subchondral sclerosis is noted. Chondrocalcinosis is present. No evidence  of fracture or dislocation.   Impression:   ICD-10-CM  1. Primary osteoarthritis of right knee M17.11   Plan:   The patient has end-stage degenerative changes of the right knee. It was explained to the patient that the condition is progressive in  nature. Having failed conservative treatment, the patient has elected to proceed with a total joint arthroplasty. The patient will undergo a total joint arthroplasty with Dr. Marry Guan. The risks of surgery, including blood clot and infection, were discussed with the patient. Measures to reduce these risks, including the use of anticoagulation, perioperative antibiotics, and early ambulation were discussed. The importance of postoperative physical therapy was discussed with the patient. The patient elects to proceed with surgery. The patient is instructed to stop all blood thinners prior to surgery. The patient is instructed to call the hospital the day before surgery to learn of the proper arrival time.   Contact our office with any questions or concerns. Follow up as indicated, or sooner should any new problems arise, if conditions worsen, or if they are otherwise concerned.   Gwenlyn Fudge, [facility] [place], [place] [address] Phone: [phone]  This note was generated in part with voice recognition software and I apologize for any typographical errors that were not detected and corrected.  Electronically signed by Gwenlyn Fudge, PA at 10/30/2021 1:54 PM EDT

## 2021-11-13 NOTE — TOC Progression Note (Signed)
Transition of Care [facility]) - Progression Note    Patient Details  Name: Nina Perez MRN: [id number] Date of Birth: 1940/10/13  Transition of Care [facility]) CM/SW [place], RN Phone Number: 11/13/2021, 1:41 PM  Clinical Narrative:     Patient has been accepted by [facility] for [facility] services, prior to Surgery       Expected Discharge Plan and Services                                                 Social Determinants of Health (SDOH) Interventions    Readmission Risk Interventions     No data to display

## 2021-11-13 NOTE — Anesthesia Procedure Notes (Signed)
Procedure Name: LMA Insertion Date/Time: 11/13/2021 1:53 PM  Performed by: Nelda Marseille, CRNAPre-anesthesia Checklist: Patient identified, Patient being monitored, Timeout performed, Emergency Drugs available and Suction available Patient Re-evaluated:Patient Re-evaluated prior to induction Oxygen Delivery Method: Circle system utilized Preoxygenation: Pre-oxygenation with 100% oxygen Induction Type: IV induction Ventilation: Mask ventilation without difficulty LMA: LMA inserted LMA Size: 4.0 Tube type: Oral Number of attempts: 1 Placement Confirmation: positive ETCO2 and breath sounds checked- equal and bilateral Tube secured with: Tape Dental Injury: Teeth and Oropharynx as per pre-operative assessment  Comments: Placed by Orland Mustard SRNA

## 2021-11-13 NOTE — Anesthesia Postprocedure Evaluation (Signed)
Anesthesia Post Note  Patient: MARQUETTA WEISKOPF  Procedure(s) Performed: COMPUTER ASSISTED TOTAL KNEE ARTHROPLASTY (Right: Knee)  Patient location during evaluation: PACU Anesthesia Type: Spinal Level of consciousness: awake and alert, oriented and patient cooperative Pain management: pain level controlled Vital Signs Assessment: post-procedure vital signs reviewed and stable Respiratory status: spontaneous breathing, nonlabored ventilation and respiratory function stable Cardiovascular status: blood pressure returned to baseline and stable Postop Assessment: adequate PO intake, no headache and spinal receding Anesthetic complications: no Comments: Required BiPAP at low pressure and oxygen for short period in PACU; quickly weaned to room air.   No notable events documented.   Last Vitals:  Vitals:   11/13/21 1800 11/13/21 1801  BP: 140/75 140/75  Pulse: (!) 58 (!) 55  Resp: 18 17  Temp:  (!) 36 C  SpO2: 98% 96%    Last Pain:  Vitals:   11/13/21 1801  TempSrc:   PainSc: Asleep    LLE Motor Response: Purposeful movement (11/13/21 1801)   RLE Motor Response: Purposeful movement (11/13/21 1801)        Darrin Nipper

## 2021-11-13 NOTE — Op Note (Signed)
OPERATIVE NOTE  DATE OF SURGERY:  11/13/2021  PATIENT NAME:  Nina Perez   DOB: Mar 31, 1941  MRN: [id number]  PRE-OPERATIVE DIAGNOSIS: Degenerative arthrosis of the right knee, primary  POST-OPERATIVE DIAGNOSIS:  Same  PROCEDURE:  Right total knee arthroplasty using computer-assisted navigation  SURGEON:  Jena Gauss. M.D.  ASSISTANT: Baldwin Jamaica, PA-C (present and scrubbed throughout the case, critical for assistance with exposure, retraction, instrumentation, and closure)  ANESTHESIA: spinal and general  ESTIMATED BLOOD LOSS: 50 mL  FLUIDS REPLACED: 600 mL of crystalloid  TOURNIQUET TIME: 83 minutes  DRAINS: 2 medium Hemovac drains  SOFT TISSUE RELEASES: Anterior cruciate ligament, posterior cruciate ligament, deep medial collateral ligament, patellofemoral ligament  IMPLANTS UTILIZED: DePuy Attune size 4 posterior stabilized femoral component (cemented), size 5 rotating platform tibial component (cemented), 32 mm medialized dome patella (cemented), and a 8 mm stabilized rotating platform polyethylene insert.  INDICATIONS FOR SURGERY: Nina Perez is a 81 y.o. year old female with a long history of progressive knee pain. X-rays demonstrated severe degenerative changes in tricompartmental fashion. The patient had not seen any significant improvement despite conservative nonsurgical intervention. After discussion of the risks and benefits of surgical intervention, the patient expressed understanding of the risks benefits and agree with plans for total knee arthroplasty.   The risks, benefits, and alternatives were discussed at length including but not limited to the risks of infection, bleeding, nerve injury, stiffness, blood clots, the need for revision surgery, cardiopulmonary complications, among others, and they were willing to proceed.  PROCEDURE IN DETAIL: The patient was brought into the operating room and, after adequate spinal and general anesthesia was achieved, a  tourniquet was placed on the patient's upper thigh. The patient's knee and leg were cleaned and prepped with alcohol and DuraPrep and draped in the usual sterile fashion. A "timeout" was performed as per usual protocol. The lower extremity was exsanguinated using an Esmarch, and the tourniquet was inflated to 300 mmHg. An anterior longitudinal incision was made followed by a standard mid vastus approach. The deep fibers of the medial collateral ligament were elevated in a subperiosteal fashion off of the medial flare of the tibia so as to maintain a continuous soft tissue sleeve. The patella was subluxed laterally and the patellofemoral ligament was incised. Inspection of the knee demonstrated severe degenerative changes with full-thickness loss of articular cartilage. Osteophytes were debrided using a rongeur. Anterior and posterior cruciate ligaments were excised. Two 4.0 mm Schanz pins were inserted in the femur and into the tibia for attachment of the array of trackers used for computer-assisted navigation. Hip center was identified using a circumduction technique. Distal landmarks were mapped using the computer. The distal femur and proximal tibia were mapped using the computer. The distal femoral cutting guide was positioned using computer-assisted navigation so as to achieve a 5 distal valgus cut. The femur was sized and it was felt that a size 4 femoral component was appropriate. A size 4 femoral cutting guide was positioned and the anterior cut was performed and verified using the computer. This was followed by completion of the posterior and chamfer cuts. Femoral cutting guide for the central box was then positioned in the center box cut was performed.  Attention was then directed to the proximal tibia. Medial and lateral menisci were excised. The extramedullary tibial cutting guide was positioned using computer-assisted navigation so as to achieve a 0 varus-valgus alignment and 3 posterior slope. The  cut was performed and verified using the computer.  The proximal tibia was sized and it was felt that a size 5 tibial tray was appropriate. Tibial and femoral trials were inserted followed by insertion of an 8 mm polyethylene insert. This allowed for excellent mediolateral soft tissue balancing both in flexion and in full extension. Finally, the patella was cut and prepared so as to accommodate a 32 mm medialized dome patella. A patella trial was placed and the knee was placed through a range of motion with excellent patellar tracking appreciated. The femoral trial was removed after debridement of posterior osteophytes. The central post-hole for the tibial component was reamed followed by insertion of a keel punch. Tibial trials were then removed. Cut surfaces of bone were irrigated with copious amounts of normal saline using pulsatile lavage and then suctioned dry. Polymethylmethacrylate cement with gentamicin was prepared in the usual fashion using a vacuum mixer. Cement was applied to the cut surface of the proximal tibia as well as along the undersurface of a size 5 rotating platform tibial component. Tibial component was positioned and impacted into place. Excess cement was removed using Personal assistant. Cement was then applied to the cut surfaces of the femur as well as along the posterior flanges of the size 4 femoral component. The femoral component was positioned and impacted into place. Excess cement was removed using Personal assistant. An 8 mm polyethylene trial was inserted and the knee was brought into full extension with steady axial compression applied. Finally, cement was applied to the backside of a 32 mm medialized dome patella and the patellar component was positioned and patellar clamp applied. Excess cement was removed using Personal assistant. After adequate curing of the cement, the tourniquet was deflated after a total tourniquet time of 83 minutes. Hemostasis was achieved using electrocautery. The  knee was irrigated with copious amounts of normal saline using pulsatile lavage followed by 450 ml of Surgiphor and then suctioned dry. 20 mL of 1.3% Exparel and 60 mL of 0.25% Marcaine in 40 mL of normal saline was injected along the posterior capsule, medial and lateral gutters, and along the arthrotomy site. An 8 mm stabilized rotating platform polyethylene insert was inserted and the knee was placed through a range of motion with excellent mediolateral soft tissue balancing appreciated and excellent patellar tracking noted. 2 medium drains were placed in the wound bed and brought out through separate stab incisions. The medial parapatellar portion of the incision was reapproximated using interrupted sutures of #1 Vicryl. Subcutaneous tissue was approximated in layers using first #0 Vicryl followed #2-0 Vicryl. The skin was approximated with skin staples. A sterile dressing was applied.  The patient tolerated the procedure well and was transported to the recovery room in stable condition.    Kiarah Eckstein P. Angie Fava., M.D.

## 2021-11-13 NOTE — H&P (Signed)
The patient has been re-examined, and the chart reviewed, and there have been no interval changes to the documented history and physical.    The risks, benefits, and alternatives have been discussed at length. The patient expressed understanding of the risks benefits and agreed with plans for surgical intervention.  Muneeb Veras P. Godwin Tedesco, Jr. M.D.    

## 2021-11-13 NOTE — Anesthesia Procedure Notes (Signed)
Spinal  Patient location during procedure: OR Start time: 11/13/2021 1:26 PM End time: 11/13/2021 1:30 PM Reason for block: surgical anesthesia Staffing Performed: resident/CRNA  Resident/CRNA: Nelda Marseille, CRNA Performed by: Nelda Marseille, CRNA Authorized by: Martha Clan, MD   Preanesthetic Checklist Completed: patient identified, IV checked, site marked, risks and benefits discussed, surgical consent, monitors and equipment checked, pre-op evaluation and timeout performed Spinal Block Patient position: sitting Prep: Betadine and ChloraPrep Patient monitoring: heart rate, continuous pulse ox, blood pressure and cardiac monitor Approach: midline Location: L4-5 Injection technique: single-shot Needle Needle type: Whitacre and Introducer  Needle gauge: 25 G Needle length: 9 cm Assessment Sensory level: T10 Events: CSF return Additional Notes Negative paresthesia. Negative blood return. Positive free-flowing CSF. Expiration date of kit checked and confirmed. Patient tolerated procedure well, without complications. Placed by Orland Mustard SRNA

## 2021-11-13 NOTE — Anesthesia Preprocedure Evaluation (Signed)
Anesthesia Evaluation  Patient identified by MRN, date of birth, ID band Patient awake    Reviewed: Allergy & Precautions, H&P , NPO status , Patient's Chart, lab work & pertinent test results  History of Anesthesia Complications Negative for: history of anesthetic complications  Airway Mallampati: II  TM Distance: >3 FB Neck ROM: full    Dental  (+) Edentulous Upper, Missing, Dental Advidsory Given,    Pulmonary shortness of breath and Long-Term Oxygen Therapy, sleep apnea and Continuous Positive Airway Pressure Ventilation , neg COPD, neg recent URI, former smoker,  unclear indication for home O2, wears 2L/min PRN SpO2 95% room air in pre-op          Cardiovascular hypertension, pulmonary hypertension(-) angina+ Peripheral Vascular Disease  (-) Past MI and (-) Cardiac Stents + dysrhythmias (LBBB) + Valvular Problems/Murmurs   Mild pulmonary hypertension  Echo April 2019: NORMAL LEFT VENTRICULAR SYSTOLIC FUNCTION WITH AN ESTIMATED EF = 55 % NORMAL RIGHT VENTRICULAR SYSTOLIC FUNCTION MILD TRICSUPID AND MITRAL VALVE INSUFFICIENCY TRACE AORTIC VALVE INSUFFICIENCY NO VALVULAR STENOSIS MILD RV ENLARGEMENT MILD BIATRIAL ENLARGEMENT MILD LVH PARADOXICAL SEPTAL WALL MOTION CONSISTENT WITH PROBABLE LEFT BUNDLE BRANCH BLOCK   Neuro/Psych negative neurological ROS  negative psych ROS   GI/Hepatic Neg liver ROS, GERD  Medicated and Controlled,  Endo/Other  diabetesMorbid obesity  Renal/GU CRFRenal disease  negative genitourinary   Musculoskeletal   Abdominal   Peds  Hematology negative hematology ROS (+)   Anesthesia Other Findings Obesity  Past Medical History: No date: Barrett's esophagus No date: Cancer ([place])     Comment:  skin ca No date: Diabetes mellitus (HCC) No date: Gout No date: History of colon cancer     Comment:  adenomatous polyps No date: Hypercholesterolemia No date: Hypertension No date:  Inflammatory arthritis No date: Osteoarthritis     Comment:  knees, spine No date: Phlebitis     Comment:  x2 (with pregnancy) No date: Pulmonary fibrosis (HCC)     Comment:  mild No date: Pulmonary hypertension (HCC) No date: Reflux esophagitis No date: Rheumatoid arthritis(714.0)     Comment:  positive RF, FANA, RNP, negative CCP ab and anti DNA,.                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, sclerodactyly, telangiectasias  Past Surgical History: 05/16/2019: ABDOMINAL AORTOGRAM W/LOWER EXTREMITY; Left     Comment:  Procedure: ABDOMINAL AORTOGRAM W/LOWER EXTREMITY;                Surgeon: Lorretta Harp, MD;  Location: [facility];  Service: Cardiovascular;  Laterality: Left; 06/02/2019: AMPUTATION TOE; Left     Comment:  Procedure: AMPUTATION TOE;  Surgeon: Albertine Patricia,               DPM;  Location: [facility];  Service: Podiatry;                Laterality: Left; No date: BREAST CYST ASPIRATION; Left     Comment:  neg 06/03/2019: LOWER EXTREMITY ANGIOGRAPHY; Left     Comment:  Procedure: Lower Extremity Angiography;  Surgeon:               Katha Cabal, MD;  Location: [facility];               Service: Cardiovascular;  Laterality: Left; 05/16/2019: PERIPHERAL VASCULAR  BALLOON ANGIOPLASTY; Left     Comment:  Procedure: PERIPHERAL VASCULAR BALLOON ANGIOPLASTY;                Surgeon: Lorretta Harp, MD;  Location: [facility];  Service: Cardiovascular;  Laterality: Left;  SFA No date: VEIN LIGATION AND STRIPPING  BMI    Body Mass Index: 36.01 kg/m      Reproductive/Obstetrics negative OB ROS                             Anesthesia Physical  Anesthesia Plan  ASA: 3  Anesthesia Plan: Spinal   Post-op Pain Management:    Induction: Intravenous  PONV Risk Score and Plan: Propofol infusion and TIVA  Airway Management Planned: Natural Airway and  Nasal Cannula  Additional Equipment:   Intra-op Plan:   Post-operative Plan:   Informed Consent: I have reviewed the patients History and Physical, chart, labs and discussed the procedure including the risks, benefits and alternatives for the proposed anesthesia with the patient or authorized representative who has indicated his/her understanding and acceptance.     Dental Advisory Given  Plan Discussed with: Anesthesiologist  Anesthesia Plan Comments: (Patient consented for risks of anesthesia including but not limited to:  - adverse reactions to medications - damage to teeth, lips or other oral mucosa - sore throat or hoarseness - Damage to heart, brain, lungs or loss of life  Patient voiced understanding.)        Anesthesia Quick Evaluation

## 2021-11-13 NOTE — Transfer of Care (Signed)
Immediate Anesthesia Transfer of Care Note  Patient: Nina Perez  Procedure(s) Performed: COMPUTER ASSISTED TOTAL KNEE ARTHROPLASTY (Right: Knee)  Patient Location: PACU  Anesthesia Type:Spinal  Level of Consciousness: awake, alert  and oriented  Airway & Oxygen Therapy: Patient Spontanous Breathing and Patient connected to face mask oxygen  Post-op Assessment: Report given to RN and Post -op Vital signs reviewed and stable  Post vital signs: Reviewed and stable  Last Vitals:  Vitals Value Taken Time  BP 144/64 11/13/21 1700  Temp    Pulse 58 11/13/21 1700  Resp 14 11/13/21 1700  SpO2 96 % 11/13/21 1700  Vitals shown include unvalidated device data.  Last Pain:  Vitals:   11/13/21 1035  TempSrc: Temporal  PainSc: 2          Complications: No notable events documented.

## 2021-11-14 ENCOUNTER — Other Ambulatory Visit: Payer: Medicare Other

## 2021-11-14 ENCOUNTER — Encounter: Payer: Self-pay | Admitting: Orthopedic Surgery

## 2021-11-14 DIAGNOSIS — M1711 Unilateral primary osteoarthritis, right knee: Secondary | ICD-10-CM | POA: Diagnosis not present

## 2021-11-14 LAB — GLUCOSE, CAPILLARY
Glucose-Capillary: 89 mg/dL (ref 70–99)
Glucose-Capillary: 122 mg/dL — ABNORMAL HIGH (ref 70–99)
Glucose-Capillary: 89 mg/dL (ref 70–99)

## 2021-11-14 MED ORDER — OXYCODONE HCL 5 MG PO TABS: 5.0000 mg | ORAL_TABLET | ORAL | 0 refills | Status: DC | PRN

## 2021-11-14 MED ORDER — TRAMADOL HCL 50 MG PO TABS: 50.0000 mg | ORAL_TABLET | ORAL | 0 refills | Status: DC | PRN

## 2021-11-14 MED ORDER — ASPIRIN 81 MG PO CHEW
162.0000 mg | CHEWABLE_TABLET | Freq: Every day | ORAL | 0 refills | Status: DC
Start: 2021-11-14 — End: 2022-11-27

## 2021-11-14 MED ORDER — CELECOXIB 200 MG PO CAPS: 200.0000 mg | ORAL_CAPSULE | Freq: Two times a day (BID) | ORAL | 0 refills | Status: DC

## 2021-11-14 NOTE — Evaluation (Signed)
Physical Therapy Evaluation Patient Details Name: Nina Perez MRN: [id number] DOB: 01/26/1941 Today's Date: 11/14/2021  History of Present Illness  pt is s/p elective R TKA on 11/13/21. PMH includes: RA, pulmonary fibrosis and pulmonary HTN.  Clinical Impression  Pt admitted with above diagnosis. Pt received sitting upright at EoB agreeable to PT services. Pt demonstrating normal sensation to RLE and excellent quad activation. At baseline pt ambulates with SPC/RW at home and very short community distances with reliance on husband for transportation. Pt reports being independent with ADL's/IADL's but husband assists PRN.   To date, pt able to stand with minguard with VC's for safe hand placement. Pt tolerating gait of ~180' with need for x1 seated rest break due to LE fatigue. Noted desat to 84% on RA but with seated rest and PLB, SPO2 returns to > 90% after 2-3 minutes. Pt requiring minor VC's throughout gait bout for keeping RW closer to BOS to improve stability and for energy conservation with fair carryover. Gait overall antalgic on RLE with heavy reliance on Ue's for support with step to pattern. Pt returning to recliner with RLE elevated in extension. Based off of current mobility, pt safe to d/c home with HHPT. All needs in reach. Pt currently with functional limitations due to the deficits listed below (see PT Problem List). Pt will benefit from skilled PT to increase their independence and safety with mobility to allow discharge to the venue listed below.          Recommendations for follow up therapy are one component of a multi-disciplinary discharge planning process, led by the attending physician.  Recommendations may be updated based on patient status, additional functional criteria and insurance authorization.  Follow Up Recommendations Home health PT      Assistance Recommended at Discharge PRN  Patient can return home with the following  A little help with  bathing/dressing/bathroom;Assist for transportation;Help with stairs or ramp for entrance;A little help with walking and/or transfers    Equipment Recommendations None recommended by PT  Recommendations for Other Services       Functional Status Assessment Patient has had a recent decline in their functional status and demonstrates the ability to make significant improvements in function in a reasonable and predictable amount of time.     Precautions / Restrictions Precautions Precautions: Knee;Fall Precaution Booklet Issued: Yes (comment) Restrictions Weight Bearing Restrictions: Yes RLE Weight Bearing: Weight bearing as tolerated      Mobility  Bed Mobility               General bed mobility comments: NT. Seated EOB prior to session Patient Response: Cooperative  Transfers Overall transfer level: Needs assistance Equipment used: Rolling walker (2 wheels) Transfers: Sit to/from Stand Sit to Stand: Min guard, From elevated surface           General transfer comment: Bed elevated to approximate height of bed at home    Ambulation/Gait Ambulation/Gait assistance: Supervision Gait Distance (Feet): 180 Feet Assistive device: Rolling walker (2 wheels) Gait Pattern/deviations: Step-to pattern, Decreased step length - right, Decreased step length - left, Decreased stance time - right, Antalgic       General Gait Details: Heavy use of RW to offload RLE during stance phase.  Stairs            Wheelchair Mobility    Modified Rankin (Stroke Patients Only)       Balance Overall balance assessment: Needs assistance Sitting-balance support: No upper extremity supported, Feet supported Sitting  balance-Leahy Scale: Fair       Standing balance-Leahy Scale: Fair                               Pertinent Vitals/Pain Pain Assessment Pain Assessment: Faces Faces Pain Scale: Hurts little more Pain Location: R knee Pain Descriptors / Indicators:  Burning Pain Intervention(s): Limited activity within patient's tolerance, Monitored during session, Premedicated before session, Repositioned, Ice applied    Home Living Family/patient expects to be discharged to:: Private residence Living Arrangements: Spouse/significant other Available Help at Discharge: Family;Available 24 hours/day Type of Home: House Home Access: Ramped entrance       Home Layout: Two level;Able to live on main level with bedroom/bathroom Home Equipment: Rolling Walker (2 wheels);Cane - single point;BSC/3in1;Wheelchair - manual      Prior Function Prior Level of Function : Independent/Modified Independent               ADLs Comments: Husband assists PRN. Does all transportation     Hand Dominance        Extremity/Trunk Assessment   Upper Extremity Assessment Upper Extremity Assessment: Overall WFL for tasks assessed    Lower Extremity Assessment Lower Extremity Assessment: Generalized weakness;RLE deficits/detail RLE Deficits / Details: R TKA RLE Sensation: WNL    Cervical / Trunk Assessment Cervical / Trunk Assessment: Normal  Communication   Communication: No difficulties  Cognition Arousal/Alertness: Awake/alert Behavior During Therapy: WFL for tasks assessed/performed Overall Cognitive Status: Within Functional Limits for tasks assessed                                          General Comments      Exercises Total Joint Exercises Ankle Circles/Pumps: AROM, Strengthening, Both, 10 reps, Seated Quad Sets: AROM, Strengthening, Right, 10 reps, Supine Gluteal Sets: AROM, Strengthening, Both, 10 reps, Seated Hip ABduction/ADduction: AROM, Strengthening, Both, 10 reps, Seated Long Arc Quad: AROM, Strengthening, 10 reps, Seated, Right Marching in Standing: AROM, Strengthening, 5 reps, Standing Other Exercises Other Exercises: Role of PT in acute setting, d/c recs, WB status, HEP (reps, sets, frequency), limb  positioning for prevention of joint contracture, safe use of DME.   Assessment/Plan    PT Assessment Patient needs continued PT services  PT Problem List Decreased strength;Decreased mobility;Decreased range of motion;Decreased activity tolerance;Pain       PT Treatment Interventions Therapeutic exercise;DME instruction;Gait training;Balance training;Stair training;Neuromuscular re-education;Functional mobility training;Therapeutic activities;Patient/family education    PT Goals (Current goals can be found in the Care Plan section)  Acute Rehab PT Goals Patient Stated Goal: improve mobility to go home safely PT Goal Formulation: With patient Time For Goal Achievement: 11/28/21 Potential to Achieve Goals: Good    Frequency BID     Co-evaluation               AM-PAC PT "6 Clicks" Mobility  Outcome Measure Help needed turning from your back to your side while in a flat bed without using bedrails?: A Little Help needed moving from lying on your back to sitting on the side of a flat bed without using bedrails?: A Little Help needed moving to and from a bed to a chair (including a wheelchair)?: A Little Help needed standing up from a chair using your arms (e.g., wheelchair or bedside chair)?: A Little Help needed to walk in hospital room?: A  Little Help needed climbing 3-5 steps with a railing? : A Lot 6 Click Score: 17    End of Session Equipment Utilized During Treatment: Gait belt Activity Tolerance: Patient tolerated treatment well Patient left: in chair;with call bell/phone within reach;with family/visitor present Nurse Communication: Mobility status PT Visit Diagnosis: Other abnormalities of gait and mobility (R26.89);Muscle weakness (generalized) (M62.81);Difficulty in walking, not elsewhere classified (R26.2);Pain Pain - Right/Left: Right Pain - part of body: Knee    Time: 8119-1478 PT Time Calculation (min) (ACUTE ONLY): 37 min   Charges:   PT Evaluation $PT  Eval Low Complexity: 1 Low PT Treatments $Gait Training: 8-22 mins $Therapeutic Exercise: 8-22 mins       Chasyn Cinque M. Fairly IV, PT, DPT Physical Therapist- [place]  [facility]  11/14/2021, 10:20 AM

## 2021-11-14 NOTE — Evaluation (Signed)
Occupational Therapy Evaluation Patient Details Name: Nina Perez MRN: [id number] DOB: [birth year] Today's Date: 11/14/2021   History of Present Illness pt is s/p elective R TKA on 11/13/21. PMH includes: RA, pulmonary fibrosis and pulmonary HTN.   Clinical Impression   Pt seen for OT evaluation this date, POD#1 from above surgery. Pt was independent in all ADL prior to surgery, however using RW in the home and [facility] to/from the car, with spouse assisting with IADL. Pt is eager to return to PLOF with less pain and improved safety and independence. Pt currently requires MIN A for LB ADL tasks, anticipate CGA for ADL transfers, and MOD A for compression stocking mgt and polar care mgt. Spouse endorses ability to provide needed level of assist. Pt instructed in role of OT, home/routines modifications, AE/DME, falls prevention, compression stocking mgt, polar care mgt, and positioning for R knee. Handout provided. Pt would benefit from skilled OT services while hospitalized including additional instruction in dressing techniques with or without assistive devices for dressing and bathing skills to support recall and carryover prior to discharge and ultimately to maximize safety, independence, and minimize falls risk and caregiver burden. Do not currently anticipate any OT needs following this hospitalization.      Recommendations for follow up therapy are one component of a multi-disciplinary discharge planning process, led by the attending physician.  Recommendations may be updated based on patient status, additional functional criteria and insurance authorization.   Follow Up Recommendations  No OT follow up    Assistance Recommended at Discharge Intermittent Supervision/Assistance  Patient can return home with the following A little help with walking and/or transfers;A little help with bathing/dressing/bathroom;Assist for transportation;Assistance with cooking/housework;Help with stairs or ramp for  entrance    Functional Status Assessment  Patient has had a recent decline in their functional status and demonstrates the ability to make significant improvements in function in a reasonable and predictable amount of time.  Equipment Recommendations  None recommended by OT    Recommendations for Other Services       Precautions / Restrictions Precautions Precautions: Knee;Fall Precaution Booklet Issued: Yes (comment) Restrictions Weight Bearing Restrictions: Yes RLE Weight Bearing: Weight bearing as tolerated      Mobility Bed Mobility               General bed mobility comments: NT. Seated in recliner prior to session    Transfers                   General transfer comment: declined, required CGA with PT earlier, will further assess next session      Balance Overall balance assessment: Needs assistance Sitting-balance support: No upper extremity supported, Feet supported Sitting balance-Leahy Scale: Fair                                     ADL either performed or assessed with clinical judgement   ADL Overall ADL's : Needs assistance/impaired                                       General ADL Comments: Pt currently requires MIN A for LB ADL tasks, anticipate CGA for ADL transfers, and MOD A for compression stocking mgt and polar care mgt. Spouse endorses ability to provide needed level of assist.     Vision  Perception     Praxis      Pertinent Vitals/Pain Pain Assessment Pain Assessment: 0-10 Pain Score: 5  Pain Location: R knee Pain Descriptors / Indicators: Burning, Aching Pain Intervention(s): Limited activity within patient's tolerance, Monitored during session, Repositioned, Ice applied, Premedicated before session     Hand Dominance     Extremity/Trunk Assessment Upper Extremity Assessment Upper Extremity Assessment: Overall WFL for tasks assessed   Lower Extremity Assessment Lower  Extremity Assessment: Generalized weakness;RLE deficits/detail RLE Deficits / Details: R TKA RLE Sensation: WNL   Cervical / Trunk Assessment Cervical / Trunk Assessment: Normal   Communication Communication Communication: No difficulties   Cognition Arousal/Alertness: Awake/alert Behavior During Therapy: WFL for tasks assessed/performed Overall Cognitive Status: Within Functional Limits for tasks assessed                                       General Comments       Exercises Other Exercises Other Exercises: Pt instructed in role of OT, home/routines modifications, AE/DME, falls prevention, compression stocking mgt, polar care mgt, and positioning for R knee. Handout provided.   Shoulder Instructions      Home Living Family/patient expects to be discharged to:: Private residence Living Arrangements: Spouse/significant other Available Help at Discharge: Family;Available 24 hours/day Type of Home: House Home Access: Ramped entrance     Home Layout: Two level;Able to live on main level with bedroom/bathroom     Bathroom Shower/Tub: Occupational psychologist: Standard     Home Equipment: Conservation officer, nature (2 wheels);Cane - single point;BSC/3in1;Wheelchair - manual;Adaptive equipment;Hand held Architectural technologist: Reacher        Prior Functioning/Environment Prior Level of Function : Independent/Modified Independent               ADLs Comments: Husband assists PRN. Does all transportation        OT Problem List: Decreased strength;Pain;Decreased range of motion;Impaired balance (sitting and/or standing);Decreased knowledge of use of DME or AE      OT Treatment/Interventions: Self-care/ADL training;Therapeutic exercise;Therapeutic activities;DME and/or AE instruction;Patient/family education;Balance training    OT Goals(Current goals can be found in the care plan section) Acute Rehab OT Goals Patient Stated Goal: go home OT  Goal Formulation: With patient/family Time For Goal Achievement: 11/28/21 Potential to Achieve Goals: Good ADL Goals Pt Will Perform Lower Body Dressing: with modified independence;with adaptive equipment;sit to/from stand Pt Will Transfer to Toilet: ambulating;with modified independence (BSC over toilet, LRAD) Pt Will Perform Toileting - Clothing Manipulation and hygiene: with modified independence Additional ADL Goal #1: Pt will independently instruct family in polar care mgt Additional ADL Goal #2: Pt will independently instruct family in compression stocking mgt  OT Frequency: Min 2X/week    Co-evaluation              AM-PAC OT "6 Clicks" Daily Activity     Outcome Measure Help from another person eating meals?: None Help from another person taking care of personal grooming?: None Help from another person toileting, which includes using toliet, bedpan, or urinal?: A Little Help from another person bathing (including washing, rinsing, drying)?: A Lot Help from another person to put on and taking off regular upper body clothing?: None Help from another person to put on and taking off regular lower body clothing?: A Lot 6 Click Score: 19   End of Session    Activity Tolerance: Patient  tolerated treatment well Patient left: in chair;with call bell/phone within reach;with chair alarm set;with family/visitor present;Other (comment);with SCD's reapplied (polar care and drain in place)  OT Visit Diagnosis: Other abnormalities of gait and mobility (R26.89);Muscle weakness (generalized) (M62.81);Pain Pain - Right/Left: Right Pain - part of body: Knee                Time: 9906-8934 OT Time Calculation (min): 23 min Charges:  OT General Charges $OT Visit: 1 Visit OT Evaluation $OT Eval Moderate Complexity: 1 Mod OT Treatments $Self Care/Home Management : 8-22 mins  Ardeth Perfect., MPH, MS, OTR/L ascom [phone] 11/14/21, 1:12 PM

## 2021-11-14 NOTE — Plan of Care (Signed)
  Problem: Education: Goal: Ability to describe self-care measures that may prevent or decrease complications (Diabetes Survival Skills Education) will improve Outcome: Progressing Goal: Individualized Educational Video(s) Outcome: Progressing   Problem: Coping: Goal: Ability to adjust to condition or change in health will improve Outcome: Progressing   Problem: Fluid Volume: Goal: Ability to maintain a balanced intake and output will improve Outcome: Progressing   Problem: Health Behavior/Discharge Planning: Goal: Ability to identify and utilize available resources and services will improve Outcome: Progressing Goal: Ability to manage health-related needs will improve Outcome: Progressing   Problem: Metabolic: Goal: Ability to maintain appropriate glucose levels will improve Outcome: Progressing   Problem: Nutritional: Goal: Maintenance of adequate nutrition will improve Outcome: Progressing Goal: Progress toward achieving an optimal weight will improve Outcome: Progressing   Problem: Skin Integrity: Goal: Risk for impaired skin integrity will decrease Outcome: Progressing   Problem: Tissue Perfusion: Goal: Adequacy of tissue perfusion will improve Outcome: Progressing   Problem: Education: Goal: Knowledge of General Education information will improve Description: Including pain rating scale, medication(s)/side effects and non-pharmacologic comfort measures Outcome: Progressing   Problem: Health Behavior/Discharge Planning: Goal: Ability to manage health-related needs will improve Outcome: Progressing   Problem: Clinical Measurements: Goal: Ability to maintain clinical measurements within normal limits will improve Outcome: Progressing Goal: Will remain free from infection Outcome: Progressing Goal: Diagnostic test results will improve Outcome: Progressing Goal: Respiratory complications will improve Outcome: Progressing Goal: Cardiovascular complication will  be avoided Outcome: Progressing   Problem: Activity: Goal: Risk for activity intolerance will decrease Outcome: Progressing   Problem: Nutrition: Goal: Adequate nutrition will be maintained Outcome: Progressing   Problem: Coping: Goal: Level of anxiety will decrease Outcome: Progressing   Problem: Elimination: Goal: Will not experience complications related to bowel motility Outcome: Progressing Goal: Will not experience complications related to urinary retention Outcome: Progressing   Problem: Pain Managment: Goal: General experience of comfort will improve Outcome: Progressing   Problem: Safety: Goal: Ability to remain free from injury will improve Outcome: Progressing   Problem: Skin Integrity: Goal: Risk for impaired skin integrity will decrease Outcome: Progressing   Problem: Education: Goal: Knowledge of the prescribed therapeutic regimen will improve Outcome: Progressing Goal: Individualized Educational Video(s) Outcome: Progressing   Problem: Activity: Goal: Ability to avoid complications of mobility impairment will improve Outcome: Progressing Goal: Range of joint motion will improve Outcome: Progressing   Problem: Clinical Measurements: Goal: Postoperative complications will be avoided or minimized Outcome: Progressing   Problem: Pain Management: Goal: Pain level will decrease with appropriate interventions Outcome: Progressing   Problem: Skin Integrity: Goal: Will show signs of wound healing Outcome: Progressing   

## 2021-11-14 NOTE — Progress Notes (Signed)
Discharge note:  Pt and family members stated that she has bedside commode and walker at home. JP drain removed and gauze with tegaderm placed. Honeycomb dressing replaced. Pt given discharge instructions and verbalized understanding. Pt wheeled out by staff with polarcare and extra honeycomb dressings as well as all belongings. Pt left via car driven by family member.

## 2021-11-14 NOTE — Progress Notes (Signed)
Met with the patient in the room at the bedside The patient lives at Home with her husband The patient  currently has DME including a rolling walker and 3 in 1 The patient will not need any additional DME They have transportation with her husband They can afford their medication  They are set up with [facility] for Home health services

## 2021-11-14 NOTE — Progress Notes (Signed)
Physical Therapy Treatment Patient Details Name: Nina Perez MRN: [id number] DOB: 03/26/1941 Today's Date: 11/14/2021   History of Present Illness pt is s/p elective R TKA on 11/13/21. PMH includes: RA, pulmonary fibrosis and pulmonary HTN.    PT Comments    Pt received upright in recliner agreeable to p.m. session. Noted pt on 2L/min via [place] due to reports of pt desat on RA. Pt performing all LE therex from HEP packet at bedside with AAROM required for heel slides and SLR. Otherwise good understanding of exercises.  Pt progressing with gait completing 180' without need for seated rest break but chair follow provided for safety. Also improved ability to mobilize RW keeping it close to BOS improving overall stability of gait. Pt returned to recliner with all needs in place. Education provided on car transfer. Pt remains safe to d/c home with HHPT at this time.    Recommendations for follow up therapy are one component of a multi-disciplinary discharge planning process, led by the attending physician.  Recommendations may be updated based on patient status, additional functional criteria and insurance authorization.  Follow Up Recommendations  Home health PT     Assistance Recommended at Discharge PRN  Patient can return home with the following A little help with bathing/dressing/bathroom;Assist for transportation;Help with stairs or ramp for entrance;A little help with walking and/or transfers   Equipment Recommendations  None recommended by PT    Recommendations for Other Services       Precautions / Restrictions Precautions Precautions: Knee;Fall Precaution Booklet Issued: Yes (comment) Restrictions Weight Bearing Restrictions: Yes RLE Weight Bearing: Weight bearing as tolerated     Mobility  Bed Mobility               General bed mobility comments: NT. Seated in recliner prior to session Patient Response: Cooperative  Transfers Overall transfer level: Needs  assistance Equipment used: Rolling walker (2 wheels) Transfers: Sit to/from Stand Sit to Stand: Supervision           General transfer comment: Bed elevated to approximate height of bed at home    Ambulation/Gait Ambulation/Gait assistance: Supervision Gait Distance (Feet): 180 Feet Assistive device: Rolling walker (2 wheels) Gait Pattern/deviations: Step-to pattern, Decreased step length - right, Decreased step length - left, Decreased stance time - right, Antalgic       General Gait Details: Heavy use of RW to offload RLE during stance phase.   Stairs             Wheelchair Mobility    Modified Rankin (Stroke Patients Only)       Balance Overall balance assessment: Needs assistance Sitting-balance support: No upper extremity supported, Feet supported Sitting balance-Leahy Scale: Fair       Standing balance-Leahy Scale: Fair                              Cognition Arousal/Alertness: Awake/alert Behavior During Therapy: WFL for tasks assessed/performed Overall Cognitive Status: Within Functional Limits for tasks assessed                                          Exercises Total Joint Exercises Ankle Circles/Pumps: AROM, Strengthening, Both, 10 reps, Seated Quad Sets: AROM, Strengthening, Right, 10 reps, Supine Gluteal Sets: AROM, Strengthening, Both, 10 reps, Seated Short Arc Quad: AROM, Strengthening, Right, 10 reps, Seated  Heel Slides: AAROM, Strengthening, Right, 10 reps, Supine Hip ABduction/ADduction: AROM, Strengthening, Both, 10 reps, Seated Straight Leg Raises: AAROM, Strengthening, Right, 10 reps, Supine Long Arc Quad: AROM, Strengthening, 10 reps, Seated, Right Marching in Standing: AROM, Strengthening, 5 reps, Standing Other Exercises Other Exercises: Role of PT in acute setting, d/c recs, WB status, HEP (reps, sets, frequency), limb positioning for prevention of joint contracture, safe use of DME.    General  Comments        Pertinent Vitals/Pain Pain Assessment Pain Assessment: Faces Faces Pain Scale: Hurts little more Pain Location: R knee Pain Descriptors / Indicators: Burning, Aching Pain Intervention(s): Limited activity within patient's tolerance, Monitored during session, Repositioned    Home Living Family/patient expects to be discharged to:: Private residence Living Arrangements: Spouse/significant other Available Help at Discharge: Family;Available 24 hours/day Type of Home: House Home Access: Ramped entrance       Home Layout: Two level;Able to live on main level with bedroom/bathroom Home Equipment: Rolling Walker (2 wheels);Cane - single point;BSC/3in1;Wheelchair - manual;Adaptive equipment;Hand held shower head      Prior Function            PT Goals (current goals can now be found in the care plan section) Acute Rehab PT Goals Patient Stated Goal: improve mobility to go home safely PT Goal Formulation: With patient Time For Goal Achievement: 11/28/21 Potential to Achieve Goals: Good Progress towards PT goals: Progressing toward goals    Frequency    BID      PT Plan Current plan remains appropriate    Co-evaluation              AM-PAC PT "6 Clicks" Mobility   Outcome Measure  Help needed turning from your back to your side while in a flat bed without using bedrails?: A Little Help needed moving from lying on your back to sitting on the side of a flat bed without using bedrails?: A Little Help needed moving to and from a bed to a chair (including a wheelchair)?: A Little Help needed standing up from a chair using your arms (e.g., wheelchair or bedside chair)?: A Little Help needed to walk in hospital room?: A Little Help needed climbing 3-5 steps with a railing? : A Lot 6 Click Score: 17    End of Session Equipment Utilized During Treatment: Gait belt Activity Tolerance: Patient tolerated treatment well Patient left: in chair;with call  bell/phone within reach;with family/visitor present;with SCD's reapplied Nurse Communication: Mobility status PT Visit Diagnosis: Other abnormalities of gait and mobility (R26.89);Muscle weakness (generalized) (M62.81);Difficulty in walking, not elsewhere classified (R26.2);Pain Pain - Right/Left: Right Pain - part of body: Knee     Time: 1403-1430 PT Time Calculation (min) (ACUTE ONLY): 27 min  Charges:  $Gait Training: 8-22 mins $Therapeutic Exercise: 8-22 mins                     Lataja Newland M. Fairly IV, PT, DPT Physical Therapist- [place]  [facility]  11/14/2021, 3:55 PM

## 2021-11-14 NOTE — Progress Notes (Signed)
  Subjective: 1 Day Post-Op Procedure(s) (LRB): COMPUTER ASSISTED TOTAL KNEE ARTHROPLASTY (Right) Patient reports pain as mild.  Husband at bedside. Patient is well, and has had no acute complaints or problems Plan is to go Home after hospital stay. Negative for chest pain and shortness of breath Fever: no Gastrointestinal: negative for nausea and vomiting.    Objective: Vital signs in last 24 hours: Temp:  [96.8 F (36 C)-98 F (36.7 C)] 97.8 F (36.6 C) (07/06 0733) Pulse Rate:  [55-80] 66 (07/06 0733) Resp:  [13-20] 17 (07/06 0733) BP: (133-170)/(62-101) 160/75 (07/06 0733) SpO2:  [90 %-99 %] 95 % (07/06 0733) Weight:  [98 kg] 98 kg (07/05 1035)  Intake/Output from previous day:  Intake/Output Summary (Last 24 hours) at 11/14/2021 0926 Last data filed at 11/14/2021 0545 Gross per 24 hour  Intake 1803.14 ml  Output 470 ml  Net 1333.14 ml    Intake/Output this shift: No intake/output data recorded.  Labs: No results for input(s): "HGB" in the last 72 hours. No results for input(s): "WBC", "RBC", "HCT", "PLT" in the last 72 hours. No results for input(s): "NA", "K", "CL", "CO2", "BUN", "CREATININE", "GLUCOSE", "CALCIUM" in the last 72 hours. No results for input(s): "LABPT", "INR" in the last 72 hours.   EXAM General - Patient is Alert, Appropriate, and Oriented Extremity - Neurovascular intact Dorsiflexion/Plantar flexion intact Compartment soft Dressing/Incision -Postoperative dressing remains in place., Polar Care in place and working. , Hemovac in place.  Motor Function - intact, moving foot and toes well on exam.  Cardiovascular- Regular rate and rhythm, no murmurs/rubs/gallops Respiratory-  Mild crackling heard in bilateral lung bases    Assessment/Plan: 1 Day Post-Op Procedure(s) (LRB): COMPUTER ASSISTED TOTAL KNEE ARTHROPLASTY (Right) Principal Problem:   Total knee replacement status  Estimated body mass index is 34.86 kg/m as calculated from the  following:   Height as of this encounter: '5\' 6"'$  (1.676 m).   Weight as of this encounter: 98 kg. Advance diet Up with therapy     DVT Prophylaxis - Ted hose and SCDs, Plavix, ASA  Weight-Bearing as tolerated to right leg  Cassell Smiles, PA-C [facility] 11/14/2021, 9:26 AM

## 2021-11-14 NOTE — Progress Notes (Signed)
Nutrition Brief Note  Patient identified on the Malnutrition Screening Tool (MST) Report  81 y/o female with h/o DM, HTN, HLD, RA, Barrett's esophagus, gout, GERD, CKD III, OSA and ILD who is s/p R TKA 7/5.   Met with pt and pt's husband in room today. Pt reports good appetite and oral intake pta and in hospital. Pt ate 75% of her breakfast today along with a chicken biscuit from [employer]. Pt ate about 25% of her lunch but reports that she was still full from eating a late breakfast. RD discussed with pt the importance of adequate nutrition needed to support post op healing and to preserve lean muscle. Recommended a daily MVI until healing is complete. Per chart, pt is down 8lbs(4%) over the past 3 months; this is not significant.   Wt Readings from Last 15 Encounters:  11/13/21 98 kg  09/26/21 98.9 kg  08/20/21 101.6 kg  06/26/21 101.6 kg  06/06/21 101.2 kg  05/21/21 102.4 kg  05/17/21 102.4 kg  05/08/21 103 kg  04/18/21 102.8 kg  04/08/21 101.2 kg  04/08/21 101.2 kg  04/02/21 101.2 kg  03/25/21 101.6 kg  01/15/21 102.7 kg  12/17/20 98.9 kg    Body mass index is 34.86 kg/m. Patient meets criteria for obesity based on current BMI.   Current diet order is CHO modified, patient is consuming approximately 75% of meals at this time. Labs and medications reviewed.   No nutrition interventions warranted at this time. If nutrition issues arise, please consult RD.   Koleen Distance MS, RD, LDN Please refer to [facility] for RD and/or RD on-call/weekend/after hours pager

## 2021-11-14 NOTE — Discharge Summary (Addendum)
Physician Discharge Summary  Patient ID: Nina Perez MRN: [id number] DOB/AGE: Apr 22, 1941 81 y.o.  Admit date: 11/13/2021 Discharge date: 11/14/2021  Admission Diagnoses:  Total knee replacement status [Z96.659]  Surgeries:Procedure(s):   Right total knee arthroplasty using computer-assisted navigation   SURGEON:  Marciano Sequin. M.D.   ASSISTANT: Cassell Smiles, PA-C (present and scrubbed throughout the case, critical for assistance with exposure, retraction, instrumentation, and closure)   ANESTHESIA: spinal and general   ESTIMATED BLOOD LOSS: 50 mL   FLUIDS REPLACED: 600 mL of crystalloid   TOURNIQUET TIME: 83 minutes   DRAINS: 2 medium Hemovac drains   SOFT TISSUE RELEASES: Anterior cruciate ligament, posterior cruciate ligament, deep medial collateral ligament, patellofemoral ligament   IMPLANTS UTILIZED: DePuy Attune size 4 posterior stabilized femoral component (cemented), size 5 rotating platform tibial component (cemented), 32 mm medialized dome patella (cemented), and a 8 mm stabilized rotating platform polyethylene insert.  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   Nocturia 12/18/2020   Acquired absence of other left toe(s) ([place]) 10/08/2020   Abnormal ECG 08/06/2020   Premature beats 07/03/2020   Dysuria 12/11/2019   Adenomatous colon polyp 11/21/2019   Phlebitis 11/21/2019   Renal cyst 11/21/2019   Swelling of lower extremity 10/26/2019   Hypomagnesemia 06/03/2019   CKD (chronic kidney disease) stage 3, GFR 30-59 ml/min (HCC) 06/02/2019   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   Atherosclerosis of native arteries of extremity with rest  pain ([place]) 05/29/2019   Critical lower limb ischemia ([place]) 05/16/2019   Open wound of left foot 03/27/2019   Decreased GFR 11/13/2018   Bladder incontinence 11/13/2018   Elevated TSH 11/13/2018   Thrombocytopenia ([place]) 07/03/2018   Acute right-sided thoracic back pain 06/16/2018   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   Pulmonary hypertension, mild ([place]) 03/04/2018   Murmur 08/16/2017   Lymphedema 03/04/2016   Lesion of neck 12/15/2015   Dizziness 12/15/2015   Venous stasis dermatitis of right lower extremity 09/10/2015   Varicose veins of right lower extremity with inflammation 09/10/2015   Breast nodule 11/05/2014   Health care maintenance 11/02/2014   Obesity (BMI 30-39.9) 06/04/2014   Osteoarthritis 02/27/2014   Environmental allergies 01/15/2014   Diabetes mellitus ([place]) 05/22/2012   Hypertension 05/22/2012   Hypercholesterolemia 05/22/2012   Rheumatoid arthritis involving multiple sites with positive rheumatoid factor ([place]) 05/22/2012   Barrett's esophagus 05/22/2012   Gout 05/22/2012   GERD (gastroesophageal reflux disease) 05/22/2012   History of colon polyps 05/22/2012    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[place])    a.) 2/2 scleroderma Dx   LBBB (left bundle branch block)    Long term current use of antithrombotics/antiplatelets    a.) ASA + clopidogrel  Long term current use of immunosuppressive drug    a.) MTX for RA and scleroderma Dx   OSA on CPAP    Osteoarthritis    a.) knees, spine   PAD (peripheral artery disease) (HCC)    Phlebitis    a.) x 2 (with pregnancy)    Pulmonary fibrosis ([place])    a.) mild   Pulmonary hypertension ([place]) 02/24/2012   a.) TTE 02/24/2012: EF 55%, RVSP 33; b.) TTE 10/12/2014: EF >55%, RVSP 36.1; c.) TTE 09/02/2017: EF 55%, RVSP 45.5   Reflux esophagitis    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 b.) renal US 05/23/2020: solid LEFT renal mass measuring 1.4 x 1.5 x 1.6. c.) MRI abd 12/04/2020: approx 1.2 cm fat signal lesion c/w benign angiolipoma   Rheumatoid arthritis    a.) positive  RF, FANA, RNP; negative CCP Ab and anti DNA; neg anti-SCL 70   Scleroderma ([place])    a.) with (+) CREST syndrome --> raynaud's, sclerodactyly, telangiectasias   Skin cancer 2018   Supplemental oxygen dependent    a.) 2L/[place] PRN for DOE related to mild pulmonary fibrosis and pulmonary HTN   T2DM (type 2 diabetes mellitus) ([place])      Transfusion:    Consultants (if any):   Discharged Condition: Improved  Hospital Course: Nina Perez is an 81 y.o. female who was admitted 11/13/2021 with a diagnosis of right knee osteoarthritis and went to the operating room on 11/13/2021 and underwent right total knee arthroplasty. The patient received perioperative antibiotics for prophylaxis (see below). The patient tolerated the procedure well and was transported to PACU in stable condition. After meeting PACU criteria, the patient was subsequently transferred to the Orthopaedics/Rehabilitation unit.   The patient received DVT prophylaxis in the form of early mobilization, Aspirin, TED hose, and Plavix, and SCDs . A sacral pad had been placed and heels were elevated off of the bed with rolled towels in order to protect skin integrity. Foley catheter was discontinued on postoperative day #0. Wound drains were discontinued on postoperative day #1. The surgical incision was healing well without signs of infection.  Physical therapy was initiated postoperatively for transfers, gait  training, and strengthening. Occupational therapy was initiated for activities of daily living and evaluation for assisted devices. Rehabilitation goals were reviewed in detail with the patient. The patient made steady progress with physical therapy and physical therapy recommended discharge to Home.   The patient achieved the preliminary goals of this hospitalization and was felt to be medically and orthopaedically appropriate for discharge.  She was given perioperative antibiotics:  Anti-infectives (From admission, onward)    Start     Dose/Rate Route Frequency Ordered Stop   11/13/21 2000  ceFAZolin (ANCEF) IVPB 2g/100 mL premix        2 g 200 mL/hr over 30 Minutes Intravenous Every 6 hours 11/13/21 1831 11/14/21 0321   11/13/21 1028  ceFAZolin (ANCEF) 2-4 GM/100ML-% IVPB       Note to Pharmacy: Olena Mater F: cabinet override      11/13/21 1028 11/13/21 2140   11/13/21 0600  ceFAZolin (ANCEF) IVPB 2g/100 mL premix        2 g 200 mL/hr over 30 Minutes Intravenous On call to O.R. 11/12/21 2308 11/13/21 1413     .  Recent vital signs:  Vitals:   11/14/21 1157 11/14/21 1528  BP: (!) 143/64 (!) 146/67  Pulse: 60 67  Resp: 17 16  Temp: 97.7 F (36.5 C) (!) 97.5 F (36.4 C)  SpO2: 92% 92%    Recent laboratory studies:  No results for input(s): "WBC", "HGB", "HCT", "PLT", "K", "CL", "CO2", "BUN", "CREATININE", "GLUCOSE", "CALCIUM", "LABPT", "INR" in the last 72 hours.  Diagnostic Studies: DG Knee Right Port  Result Date: 11/13/2021 CLINICAL DATA:  Post RIGHT total knee arthroplasty EXAM: PORTABLE RIGHT KNEE - 1-2 VIEW COMPARISON:  Portable exam 6948 hours without priors for comparison FINDINGS: Osseous demineralization. Components of RIGHT knee prosthesis identified in expected positions. Anterior surgical drains. No fracture, dislocation, or bone destruction. IMPRESSION: RIGHT knee arthroplasty without acute abnormalities. Electronically Signed   By: Lavonia Dana M.D.   On:  11/13/2021 17:03    Discharge Medications:   Allergies as of 11/14/2021       Reactions   Maxzide [triamterene-hctz] Other (See Comments)   Weakness/fatigue   Tape Other (See Comments)   Ob band aid adhesive  Pull skin off / Rash   Cephalexin Rash   Blotchiness to B lower legs after several days of cephalexin for UTI   Hydrochlorothiazide W-triamterene    Other reaction(s): Other (See Comments) Extreme fatigue/weakness        Medication List     STOP taking these medications    aspirin 81 MG tablet Replaced by: aspirin 81 MG chewable tablet       TAKE these medications    acetaminophen 650 MG CR tablet Commonly known as: TYLENOL Take 650-1,300 mg by mouth every 8 (eight) hours as needed for pain.   allopurinol 300 MG tablet Commonly known as: ZYLOPRIM Take 1 tablet by mouth once daily   aspirin 81 MG chewable tablet Chew 2 tablets (162 mg total) by mouth daily. Replaces: aspirin 81 MG tablet   atorvastatin 40 MG tablet Commonly known as: LIPITOR Take 1 tablet by mouth once daily   celecoxib 200 MG capsule Commonly known as: CELEBREX Take 1 capsule (200 mg total) by mouth 2 (two) times daily.   clopidogrel 75 MG tablet Commonly known as: PLAVIX Take 1 tablet by mouth once daily with breakfast   fexofenadine 180 MG tablet Commonly known as: ALLEGRA Take 1 tablet (180 mg total) by mouth daily. What changed:  when to take this reasons to take this   folic acid 1 MG tablet Commonly known as: FOLVITE Take 1 mg by mouth every evening.   glucose blood test strip Commonly known as: Estate manager/land agent USE  STRIP TO CHECK GLUCOSE TWICE DAILY. Dx: E11.9   ipratropium 0.06 % nasal spray Commonly known as: ATROVENT Place 2 sprays into both nostrils 4 (four) times daily. What changed:  when to take this reasons to take this   losartan 100 MG tablet Commonly known as: COZAAR Take 1 tablet by mouth once daily   metFORMIN 500 MG tablet Commonly known  as: GLUCOPHAGE Take 1 tablet by mouth once daily   methotrexate 2.5 MG tablet Commonly known as: RHEUMATREX Take 10 mg by mouth every Wednesday.   metoprolol succinate 50 MG 24 hr tablet Commonly known as: TOPROL-XL TAKE 1 TABLET BY MOUTH ONCE DAILY IMMEDIATELY  FOLLOWING  A  MEAL   Microlet Lancets Misc Check sugar once daily, [employer] Microlet Lancets. Dx E11.9   oxyCODONE 5 MG immediate release tablet Commonly known as: Oxy IR/ROXICODONE Take 1 tablet (5 mg total) by mouth every 4 (four) hours as needed for severe pain.   pantoprazole 40 MG tablet Commonly known as: PROTONIX  Take 1 tablet by mouth once daily   torsemide 20 MG tablet Commonly known as: DEMADEX Take 20 mg by mouth as needed. Taking 1/2 tablet just as needed   traMADol 50 MG tablet Commonly known as: ULTRAM Take 1 tablet (50 mg total) by mouth every 4 (four) hours as needed for moderate pain.   triamcinolone cream 0.1 % Commonly known as: KENALOG Apply 1 application topically 2 (two) times daily. To lower legs before putting on and/or after removing compression hose for redness in legs   trospium 20 MG tablet Commonly known as: SANCTURA Take 1 tablet (20 mg total) by mouth at bedtime.               Durable Medical Equipment  (From admission, onward)           Start     Ordered   11/13/21 1832  DME Walker rolling  Once       Question:  Patient needs a walker to treat with the following condition  Answer:  Total knee replacement status   11/13/21 1831   11/13/21 1832  DME Bedside commode  Once       Question:  Patient needs a bedside commode to treat with the following condition  Answer:  Total knee replacement status   11/13/21 1831            Disposition: Home with home health PT       [facility], PA-C 11/14/2021, 6:19 PM

## 2021-11-19 ENCOUNTER — Encounter: Payer: Medicare Other | Admitting: Internal Medicine

## 2021-11-20 ENCOUNTER — Ambulatory Visit (INDEPENDENT_AMBULATORY_CARE_PROVIDER_SITE_OTHER): Payer: Medicare Other

## 2021-11-20 VITALS — Ht 66.0 in | Wt 216.0 lb

## 2021-11-20 DIAGNOSIS — Z0000 Encounter for general adult medical examination without abnormal findings: Secondary | ICD-10-CM | POA: Diagnosis not present

## 2021-11-20 NOTE — Patient Instructions (Addendum)
  Nina Perez , Thank you for taking time to come for your Medicare Wellness Visit. I appreciate your ongoing commitment to your health goals. Please review the following plan we discussed and let me know if I can assist you in the future.   These are the goals we discussed:  Goals       Patient Stated     Low carb diet (pt-stated)      Other     Follow up with pcp      As needed        This is a list of the screening recommended for you and due dates:  Health Maintenance  Topic Date Due   COVID-19 Vaccine (6 - Booster for Pfizer series) 12/06/2021*   Complete foot exam   12/10/2021*   DEXA scan (bone density measurement)  02/09/2022*   Tetanus Vaccine  11/21/2022*   Flu Shot  12/10/2021   Hemoglobin A1C  05/03/2022   Eye exam for diabetics  10/24/2022   Pneumonia Vaccine  Completed   HPV Vaccine  Aged Out   Mammogram  Discontinued   Zoster (Shingles) Vaccine  Discontinued  *Topic was postponed. The date shown is not the original due date.

## 2021-11-20 NOTE — Progress Notes (Addendum)
Subjective:   Nina Perez is a 81 y.o. female who presents for Medicare Annual (Subsequent) preventive examination.  Review of Systems    No ROS.  Medicare Wellness Virtual Visit.  Visual/audio telehealth visit, UTA vital signs.   See social history for additional risk factors.   Cardiac Risk Factors include: advanced age (>35mn, >>11women)     Objective:    Today's Vitals   11/20/21 0937  Weight: 216 lb (98 kg)  Height: '5\' 6"'$  (1.676 m)   Body mass index is 34.86 kg/m.     11/13/2021   10:29 AM 11/01/2021    9:42 AM 09/26/2021    9:29 AM 05/21/2021    8:44 AM 04/08/2021    6:32 AM 04/02/2021   12:11 PM 03/25/2021   12:02 PM  Advanced Directives  Does Patient Have a Medical Advance Directive? Yes Yes Yes Yes No Yes Yes  Type of AParamed[place]of [place]ew [place] Living will;Healthcare Power of [place] Living will;Healthcare Power of Attorney  Does patient want to make changes to medical advance directive? No - Patient declined        Copy of [place] Chart? No - copy requested  Yes - validated most recent copy scanned in chart (See row information) No - copy requested     Would patient like information on creating a medical advance directive?     No - Patient declined      Current Medications (verified) Outpatient Encounter Medications as of 11/20/2021  Medication Sig   acetaminophen (TYLENOL) 650 MG CR tablet Take 650-1,300 mg by mouth every 8 (eight) hours as needed for pain.   allopurinol (ZYLOPRIM) 300 MG tablet Take 1 tablet by mouth once daily   aspirin 81 MG chewable tablet Chew 2 tablets (162 mg total) by mouth daily.   atorvastatin (LIPITOR) 40 MG tablet Take 1 tablet by mouth once daily   celecoxib (CELEBREX) 200 MG capsule Take 1 capsule (200 mg total) by mouth 2 (two) times daily.   clopidogrel (PLAVIX) 75 MG tablet Take 1 tablet by mouth  once daily with breakfast   fexofenadine (ALLEGRA) 180 MG tablet Take 1 tablet (180 mg total) by mouth daily. (Patient taking differently: Take 180 mg by mouth daily as needed for allergies.)   folic acid (FOLVITE) 1 MG tablet Take 1 mg by mouth every evening.    glucose blood (BAYER CONTOUR TEST) test strip USE  STRIP TO CHECK GLUCOSE TWICE DAILY. Dx: E11.9   ipratropium (ATROVENT) 0.06 % nasal spray Place 2 sprays into both nostrils 4 (four) times daily. (Patient taking differently: Place 2 sprays into both nostrils 4 (four) times daily as needed for rhinitis.)   losartan (COZAAR) 100 MG tablet Take 1 tablet by mouth once daily   metFORMIN (GLUCOPHAGE) 500 MG tablet Take 1 tablet by mouth once daily   methotrexate (RHEUMATREX) 2.5 MG tablet Take 10 mg by mouth every Wednesday.   metoprolol succinate (TOPROL-XL) 50 MG 24 hr tablet TAKE 1 TABLET BY MOUTH ONCE DAILY IMMEDIATELY  FOLLOWING  A  MEAL   MICROLET LANCETS MISC Check sugar once daily, [employer] Microlet Lancets. Dx E11.9   oxyCODONE (OXY IR/ROXICODONE) 5 MG immediate release tablet Take 1 tablet (5 mg total) by mouth every 4 (four) hours as needed for severe pain. (Patient not taking: Reported on 11/20/2021)   pantoprazole (PROTONIX) 40 MG tablet Take 1 tablet  by mouth once daily   torsemide (DEMADEX) 20 MG tablet Take 20 mg by mouth as needed. Taking 1/2 tablet just as needed   traMADol (ULTRAM) 50 MG tablet Take 1 tablet (50 mg total) by mouth every 4 (four) hours as needed for moderate pain.   triamcinolone cream (KENALOG) 0.1 % Apply 1 application topically 2 (two) times daily. To lower legs before putting on and/or after removing compression hose for redness in legs   trospium (SANCTURA) 20 MG tablet Take 1 tablet (20 mg total) by mouth at bedtime.   No facility-administered encounter medications on file as of 11/20/2021.    Allergies (verified) Maxzide [triamterene-hctz], Tape, Cephalexin, and Hydrochlorothiazide w-triamterene    History: 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[place])    a.) 2/2 scleroderma Dx   LBBB (left bundle branch block)    Long term current use of antithrombotics/antiplatelets    a.) ASA + clopidogrel   Long term current use of immunosuppressive drug    a.) MTX for RA and scleroderma Dx   OSA on CPAP    Osteoarthritis    a.) knees, spine   PAD (peripheral artery disease) (HCC)    Phlebitis    a.) x 2 (with pregnancy)   Pulmonary fibrosis ([place])    a.) mild   Pulmonary hypertension ([place]) 02/24/2012   a.) TTE 02/24/2012: EF 55%, RVSP 33; b.) TTE 10/12/2014: EF >55%, RVSP 36.1; c.) TTE 09/02/2017: EF 55%, RVSP 45.5   Reflux esophagitis    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 b.) renal US 05/23/2020: solid LEFT renal mass measuring 1.4 x 1.5 x 1.6. 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, sclerodactyly, telangiectasias   Skin cancer 2018   Supplemental oxygen dependent    a.) 2L/[place] PRN for DOE related to mild pulmonary fibrosis and pulmonary HTN   T2DM (type 2 diabetes mellitus) ([place])    Past Surgical History:  Procedure Laterality Date   ABDOMINAL AORTOGRAM W/LOWER EXTREMITY Left 05/16/2019   Procedure: ABDOMINAL AORTOGRAM W/LOWER EXTREMITY;  Surgeon: Lorretta Harp, MD;  Location: [facility];   Service: Cardiovascular;  Laterality: Left;   AMPUTATION TOE Left 06/02/2019   Procedure: AMPUTATION TOE;  Surgeon: Albertine Patricia, DPM;  Location: [facility];  Service: Podiatry;  Laterality: Left;   APPENDECTOMY  1972   BREAST CYST ASPIRATION Left    neg   COLONOSCOPY WITH PROPOFOL N/A 09/26/2021   Procedure: COLONOSCOPY WITH PROPOFOL;  Surgeon: Annamaria Helling, DO;  Location: [facility];  Service: Gastroenterology;  Laterality: N/A;  DM, WHEELCHAIR, PLAVIX   ESOPHAGOGASTRODUODENOSCOPY (EGD) WITH PROPOFOL N/A 09/26/2021   Procedure: ESOPHAGOGASTRODUODENOSCOPY (EGD) WITH PROPOFOL;  Surgeon: Annamaria Helling, DO;  Location: [place];  Service:  Gastroenterology;  Laterality: N/A;   INCISION AND DRAINAGE OF WOUND Left 06/07/2019   Procedure: IRRIGATION AND DEBRIDEMENT WOUND;  Surgeon: Albertine Patricia, DPM;  Location: [facility];  Service: Podiatry;  Laterality: Left;   KNEE ARTHROPLASTY Right 11/13/2021   Procedure: COMPUTER ASSISTED TOTAL KNEE ARTHROPLASTY;  Surgeon: Dereck Leep, MD;  Location: [facility];  Service: Orthopedics;  Laterality: Right;   LOWER EXTREMITY ANGIOGRAPHY Left 06/03/2019   Procedure: Lower Extremity Angiography;  Surgeon: Katha Cabal, MD;  Location: [facility];  Service: Cardiovascular;  Laterality: Left;   LOWER EXTREMITY ANGIOGRAPHY Right 05/21/2021   Procedure: LOWER EXTREMITY ANGIOGRAPHY;  Surgeon: Katha Cabal, MD;  Location: [facility];  Service: Cardiovascular;  Laterality: Right;   PERIPHERAL VASCULAR BALLOON ANGIOPLASTY Left 05/16/2019   Procedure: PERIPHERAL VASCULAR BALLOON ANGIOPLASTY;  Surgeon: Lorretta Harp, MD;  Location: [facility];  Service: Cardiovascular;  Laterality: Left;  SFA   TUBAL LIGATION  1972   VEIN LIGATION AND STRIPPING     Family History  Problem Relation Age of Onset   Arthritis Mother    Heart attack Father    Cancer Sister    Cancer - Prostate Brother    Colon cancer Neg  Hx    Breast cancer Neg Hx    Social History   Socioeconomic History   Marital status: Married    Spouse name: [name]   Number of children: 2   Years of education: Not on file   Highest education level: Not on file  Occupational History   Not on file  Tobacco Use   Smoking status: Former    Packs/day: 0.50    Years: 15.00    Total pack years: 7.50    Types: Cigarettes    Quit date: 05/12/1988    Years since quitting: 33.5   Smokeless tobacco: Never  Vaping Use   Vaping Use: Never used  Substance and Sexual Activity   Alcohol use: No    Alcohol/week: 0.0 standard drinks of alcohol   Drug use: No   Sexual activity: Yes  Other Topics Concern   Not on file  Social History Narrative   Lives at home with spouse [name]    Social Determinants of Health   Financial Resource Strain: Low Risk  (11/19/2020)   Overall Financial Resource Strain (CARDIA)    Difficulty of Paying Living Expenses: Not hard at all  Food Insecurity: No Food Insecurity (11/19/2020)   Hunger Vital Sign    Worried About Running Out of Food in the Last Year: Never true    Ran Out of Food in the Last Year: Never true  Transportation Needs: No Transportation Needs (11/19/2020)   PRAPARE - Hydrologist (Medical): No    Lack of Transportation (Non-Medical): No  Physical Activity: Not on file  Stress: No Stress Concern Present (11/19/2020)   [place]    Feeling of Stress : Not at all  Social Connections: [place] (11/19/2020)   Social Connection and Isolation Panel [NHANES]    Frequency of Communication with Friends and Family: More than three times a week    Frequency of Social Gatherings with Friends and Family: Not on file    Attends Religious Services: More than 4 times per year    Active Member of [employer] or Organizations: Yes    Attends Archivist Meetings: More than 4 times per year  Marital Status: Married    Tobacco Counseling Counseling given: Not Answered  Clinical Intake: Pre-visit preparation completed: Yes        Diabetes: No  How often do you need to have someone help you when you read instructions, pamphlets, or other written materials from your doctor or pharmacy?: 1 - Never  Interpreter Needed?: No    Activities of Daily Living    11/20/2021    9:34 AM 11/13/2021   10:29 AM  In your present state of health, do you have any difficulty performing the following activities:  Hearing? 0 0  Vision? 0 0  Difficulty concentrating or making decisions? 0 0  Walking or climbing stairs? 1 1  Comment Walker in use when ambulating. cane and walker at home  Dressing or bathing? 0 0  Doing errands, shopping? 1 0  Comment Family assist     Patient Care Team: Einar Pheasant, MD as PCP - General (Internal Medicine) Lorretta Harp, MD as PCP - Cardiology (Cardiology)  Indicate any recent Medical Services you may have received from other than [facility] providers in the past year (date may be approximate).     Assessment:   This is a routine wellness examination for Nina Perez.  Virtual Visit via Telephone Note  I connected with  Nina Perez on 11/20/21 at  9:30 AM EDT by telephone and verified that I am speaking with the correct person using two identifiers.  Persons participating in the virtual visit: patient/Nurse Health Advisor   I discussed the limitations of performing an evaluation and management service by telehealth. We continued and completed visit with audio only. Some vital signs may be absent or patient reported.   Hearing/Vision screen Hearing Screening - Comments:: Patient is able to hear conversational tones without difficulty. No issues reported. Vision Screening - Comments:: Followed by Thedora Hinders  Wears corrective lenses  They have seen their ophthalmologist in the last 12 months.   Dietary issues and exercise activities  discussed: Current Exercise Habits: Structured exercise class (Physical therapy), Time (Minutes): 60, Frequency (Times/Week): 3, Weekly Exercise (Minutes/Week): 180 Regular diet  Good water intake   Goals Addressed             This Visit's Progress    Follow up with pcp       As needed       Depression Screen    04/18/2021   11:30 AM 11/19/2020   12:37 PM 10/02/2020   10:33 AM 11/17/2019   12:54 PM 11/09/2018    8:37 AM 11/06/2017   10:01 AM 12/08/2016    2:35 PM  PHQ 2/9 Scores  PHQ - 2 Score 0 0 0 0 1 0 0    Fall Risk    11/19/2020   12:40 PM 10/02/2020   10:33 AM 11/17/2019   12:54 PM 11/09/2018    8:37 AM 11/06/2017   10:01 AM  Fall Risk   Falls in the past year? 1 1 0 0 No  Number falls in past yr:  0 0    Injury with Fall?  0     Follow up  Falls evaluation completed Falls evaluation completed      Bone Gap: Home free of loose throw rugs in walkways, pet beds, electrical cords, etc? Yes  Adequate lighting in your home to reduce risk of falls? Yes   ASSISTIVE DEVICES UTILIZED TO PREVENT FALLS: Use of a cane, walker or w/c? Yes   TIMED  UP AND GO: Was the test performed? No .   Cognitive Function: Patient is alert and oriented x3.      11/06/2017   10:26 AM 03/20/2016    4:52 PM  MMSE - Mini Mental State Exam  Not completed:  Unable to complete  Orientation to time 5   Orientation to Place 5   Registration 3   Attention/ Calculation 5   Recall 2   Language- name 2 objects 2   Language- repeat 1   Language- follow 3 step command 3   Language- read & follow direction 1   Write a sentence 1   Copy design 1   Total score 29         11/17/2019   12:55 PM 11/09/2018    8:42 AM  6CIT Screen  What Year? 0 points 0 points  What month? 0 points 0 points  What time?  0 points  Count back from 20  0 points  Months in reverse 0 points 0 points  Repeat phrase 0 points     Immunizations Immunization History   Administered Date(s) Administered   Fluad Quad(high Dose 65+) 02/07/2019, 01/15/2021   Influenza Split 02/09/2013   Influenza Whole 02/18/2017   Influenza, High Dose Seasonal PF 04/14/2016, 02/23/2018, 02/29/2020   Influenza,inj,Quad PF,6+ Mos 02/28/2014, 02/01/2015   Influenza-Unspecified 02/20/2012   PFIZER Comirnaty(Gray Top)Covid-19 Tri-Sucrose Vaccine 07/07/2019, 07/28/2019   PFIZER(Purple Top)SARS-COV-2 Vaccination 07/07/2019, 07/28/2019, 02/13/2020   Pneumococcal Conjugate-13 09/06/2013   Pneumococcal Polysaccharide-23 06/12/2017   TDAP status: Due, Education has been provided regarding the importance of this vaccine. Advised may receive this vaccine at local pharmacy or Health Dept. Aware to provide a copy of the vaccination record if obtained from local pharmacy or Health Dept. Verbalized acceptance and understanding.Deferred.   Tdap vaccine- discontinued per patient.  Shingrix vaccine- discontinued per patient.   Screening Tests Health Maintenance  Topic Date Due   COVID-19 Vaccine (6 - Booster for Pfizer series) 12/06/2021 (Originally 04/09/2020)   FOOT EXAM  12/10/2021 (Originally 10/24/2020)   DEXA SCAN  02/09/2022 (Originally 05/22/2006)   TETANUS/TDAP  11/21/2022 (Originally 05/22/1960)   INFLUENZA VACCINE  12/10/2021   HEMOGLOBIN A1C  05/03/2022   OPHTHALMOLOGY EXAM  10/24/2022   Pneumonia Vaccine 66+ Years old  Completed   HPV VACCINES  Aged Out   MAMMOGRAM  Discontinued   Zoster Vaccines- Shingrix  Discontinued   Health Maintenance There are no preventive care reminders to display for this patient.  Mammogram- discontinued per patient.   Bone density- deferred per patient.   Hepatitis C Screening: does not qualify.  Vision Screening: Recommended annual ophthalmology exams for early detection of glaucoma and other disorders of the eye.  Dental Screening: Recommended annual dental exams for proper oral hygiene  Community Resource Referral / Chronic Care  Management: CRR required this visit?  No   CCM required this visit?  No      Plan:   Keep all routine maintenance appointments.   I have personally reviewed and noted the following in the patient's chart:   Medical and social history Use of alcohol, tobacco or illicit drugs  Current medications and supplements including opioid prescriptions. Talking Tramadol, followed by Ortho, Dr. Marry Guan. Functional ability and status Nutritional status Physical activity Advanced directives List of other physicians Hospitalizations, surgeries, and ER visits in previous 12 months Vitals Screenings to include cognitive, depression, and falls Referrals and appointments  In addition, I have reviewed and discussed with patient certain preventive protocols, quality  metrics, and best practice recommendations. A written personalized care plan for preventive services as well as general preventive health recommendations were provided to patient.     OBrien-Blaney, Senita Corredor L, LPN   2/99/3716     I have reviewed the above information and agree with above.   Deborra Medina, MD

## 2021-12-04 ENCOUNTER — Other Ambulatory Visit: Payer: Self-pay | Admitting: Internal Medicine

## 2021-12-16 ENCOUNTER — Ambulatory Visit (INDEPENDENT_AMBULATORY_CARE_PROVIDER_SITE_OTHER): Payer: Medicare Other | Admitting: Vascular Surgery

## 2021-12-16 ENCOUNTER — Encounter (INDEPENDENT_AMBULATORY_CARE_PROVIDER_SITE_OTHER): Payer: Medicare Other

## 2021-12-18 ENCOUNTER — Other Ambulatory Visit (INDEPENDENT_AMBULATORY_CARE_PROVIDER_SITE_OTHER): Payer: Medicare Other

## 2021-12-18 ENCOUNTER — Ambulatory Visit: Payer: Self-pay | Admitting: Urology

## 2021-12-18 DIAGNOSIS — I10 Essential (primary) hypertension: Secondary | ICD-10-CM

## 2021-12-18 DIAGNOSIS — E7800 Pure hypercholesterolemia, unspecified: Secondary | ICD-10-CM

## 2021-12-18 DIAGNOSIS — D696 Thrombocytopenia, unspecified: Secondary | ICD-10-CM

## 2021-12-18 DIAGNOSIS — E1159 Type 2 diabetes mellitus with other circulatory complications: Secondary | ICD-10-CM

## 2021-12-18 DIAGNOSIS — K2270 Barrett's esophagus without dysplasia: Secondary | ICD-10-CM

## 2021-12-18 LAB — CBC WITH DIFFERENTIAL/PLATELET
Basophils Absolute: 0.1 10*3/uL (ref 0.0–0.1)
Basophils Relative: 1.3 % (ref 0.0–3.0)
Eosinophils Absolute: 0 10*3/uL (ref 0.0–0.7)
Eosinophils Relative: 0.9 % (ref 0.0–5.0)
HCT: 38.9 % (ref 36.0–46.0)
Hemoglobin: 12.2 g/dL (ref 12.0–15.0)
Lymphocytes Relative: 20.3 % (ref 12.0–46.0)
Lymphs Abs: 0.8 10*3/uL (ref 0.7–4.0)
MCHC: 31.3 g/dL (ref 30.0–36.0)
MCV: 101 fl — ABNORMAL HIGH (ref 78.0–100.0)
Monocytes Absolute: 0.6 10*3/uL (ref 0.1–1.0)
Monocytes Relative: 14.2 % — ABNORMAL HIGH (ref 3.0–12.0)
Neutro Abs: 2.5 10*3/uL (ref 1.4–7.7)
Neutrophils Relative %: 63.3 % (ref 43.0–77.0)
Platelets: 136 10*3/uL — ABNORMAL LOW (ref 150.0–400.0)
RBC: 3.85 Mil/uL — ABNORMAL LOW (ref 3.87–5.11)
RDW: 21 % — ABNORMAL HIGH (ref 11.5–15.5)
WBC: 4 10*3/uL (ref 4.0–10.5)

## 2021-12-18 LAB — LIPID PANEL
Cholesterol: 96 mg/dL (ref 0–200)
HDL: 38.5 mg/dL — ABNORMAL LOW (ref >39.00)
LDL Cholesterol: 35 mg/dL (ref 0–99)
NonHDL: 57.07
Total CHOL/HDL Ratio: 2
Triglycerides: 111 mg/dL (ref 0.0–149.0)
VLDL: 22.2 mg/dL (ref 0.0–40.0)

## 2021-12-18 LAB — HEPATIC FUNCTION PANEL
ALT: 10 U/L (ref 0–35)
AST: 20 U/L (ref 0–37)
Albumin: 3.6 g/dL (ref 3.5–5.2)
Alkaline Phosphatase: 89 U/L (ref 39–117)
Bilirubin, Direct: 0.2 mg/dL (ref 0.0–0.3)
Total Bilirubin: 0.8 mg/dL (ref 0.2–1.2)
Total Protein: 7 g/dL (ref 6.0–8.3)

## 2021-12-18 LAB — TSH: TSH: 4.5 u[IU]/mL (ref 0.35–5.50)

## 2021-12-18 LAB — BASIC METABOLIC PANEL
BUN: 17 mg/dL (ref 6–23)
CO2: 28 mEq/L (ref 19–32)
Calcium: 9.8 mg/dL (ref 8.4–10.5)
Chloride: 104 mEq/L (ref 96–112)
Creatinine, Ser: 0.95 mg/dL (ref 0.40–1.20)
GFR: 56.56 mL/min — ABNORMAL LOW (ref >60.00)
Glucose, Bld: 92 mg/dL (ref 70–99)
Potassium: 4.4 mEq/L (ref 3.5–5.1)
Sodium: 140 mEq/L (ref 135–145)

## 2021-12-18 LAB — MAGNESIUM: Magnesium: 1.1 mg/dL — ABNORMAL LOW (ref 1.5–2.5)

## 2021-12-18 LAB — HEMOGLOBIN A1C: Hgb A1c MFr Bld: 6 % (ref 4.6–6.5)

## 2021-12-18 NOTE — Addendum Note (Signed)
Addended by: Neta Ehlers on: 12/18/2021 08:14 AM   Modules accepted: Orders

## 2021-12-19 ENCOUNTER — Other Ambulatory Visit: Payer: Self-pay

## 2021-12-19 DIAGNOSIS — K2270 Barrett's esophagus without dysplasia: Secondary | ICD-10-CM

## 2021-12-19 LAB — MICROALBUMIN / CREATININE URINE RATIO
Creatinine,U: 102.9 mg/dL
Microalb Creat Ratio: 17.1 mg/g (ref 0.0–30.0)
Microalb, Ur: 17.6 mg/dL — ABNORMAL HIGH (ref 0.0–1.9)

## 2021-12-20 ENCOUNTER — Other Ambulatory Visit: Payer: Self-pay | Admitting: Internal Medicine

## 2021-12-20 MED ORDER — MAGNESIUM OXIDE -MG SUPPLEMENT 400 (240 MG) MG PO TABS
400.0000 mg | ORAL_TABLET | Freq: Two times a day (BID) | ORAL | 2 refills | Status: DC
Start: 2021-12-20 — End: 2022-11-27

## 2021-12-20 NOTE — Progress Notes (Signed)
Rx sent in for mag oxide '400mg'$  bid #60 with 2 refills.

## 2021-12-24 ENCOUNTER — Ambulatory Visit (INDEPENDENT_AMBULATORY_CARE_PROVIDER_SITE_OTHER): Payer: Medicare Other | Admitting: Internal Medicine

## 2021-12-24 ENCOUNTER — Encounter: Payer: Self-pay | Admitting: Internal Medicine

## 2021-12-24 DIAGNOSIS — K2100 Gastro-esophageal reflux disease with esophagitis, without bleeding: Secondary | ICD-10-CM

## 2021-12-24 DIAGNOSIS — M7989 Other specified soft tissue disorders: Secondary | ICD-10-CM

## 2021-12-24 DIAGNOSIS — E1159 Type 2 diabetes mellitus with other circulatory complications: Secondary | ICD-10-CM

## 2021-12-24 DIAGNOSIS — M341 CR(E)ST syndrome: Secondary | ICD-10-CM | POA: Diagnosis not present

## 2021-12-24 DIAGNOSIS — E7800 Pure hypercholesterolemia, unspecified: Secondary | ICD-10-CM

## 2021-12-24 DIAGNOSIS — I700 Atherosclerosis of aorta: Secondary | ICD-10-CM

## 2021-12-24 DIAGNOSIS — I70221 Atherosclerosis of native arteries of extremities with rest pain, right leg: Secondary | ICD-10-CM

## 2021-12-24 DIAGNOSIS — Z89422 Acquired absence of other left toe(s): Secondary | ICD-10-CM

## 2021-12-24 DIAGNOSIS — N1831 Chronic kidney disease, stage 3a: Secondary | ICD-10-CM

## 2021-12-24 DIAGNOSIS — D696 Thrombocytopenia, unspecified: Secondary | ICD-10-CM

## 2021-12-24 DIAGNOSIS — I272 Pulmonary hypertension, unspecified: Secondary | ICD-10-CM

## 2021-12-24 DIAGNOSIS — K2270 Barrett's esophagus without dysplasia: Secondary | ICD-10-CM

## 2021-12-24 DIAGNOSIS — I10 Essential (primary) hypertension: Secondary | ICD-10-CM

## 2021-12-24 DIAGNOSIS — R351 Nocturia: Secondary | ICD-10-CM

## 2021-12-24 DIAGNOSIS — Z0000 Encounter for general adult medical examination without abnormal findings: Secondary | ICD-10-CM

## 2021-12-24 DIAGNOSIS — M0579 Rheumatoid arthritis with rheumatoid factor of multiple sites without organ or systems involvement: Secondary | ICD-10-CM

## 2021-12-24 DIAGNOSIS — D1771 Benign lipomatous neoplasm of kidney: Secondary | ICD-10-CM

## 2021-12-24 DIAGNOSIS — Z96659 Presence of unspecified artificial knee joint: Secondary | ICD-10-CM

## 2021-12-24 DIAGNOSIS — Z1231 Encounter for screening mammogram for malignant neoplasm of breast: Secondary | ICD-10-CM

## 2021-12-24 LAB — HM DIABETES FOOT EXAM

## 2021-12-24 NOTE — Progress Notes (Unsigned)
Patient ID: Nina Perez, female   DOB: Jul 26, 1940, 81 y.o.   MRN: [id number]   Subjective:    Patient ID: Nina Perez, female    DOB: 12-28-1940, 81 y.o.   MRN: [id number]   Patient here for  Chief Complaint  Patient presents with   Annual Exam    Stays cold and trembles a lot   .   HPI    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[place])    a.) 2/2 scleroderma Dx   LBBB (left bundle branch block)    Long term current use of antithrombotics/antiplatelets    a.) ASA + clopidogrel   Long term current use of immunosuppressive drug    a.) MTX for RA and scleroderma Dx   OSA on CPAP    Osteoarthritis    a.) knees, spine   PAD (peripheral artery disease) (HCC)    Phlebitis    a.) x 2 (with pregnancy)   Pulmonary fibrosis ([place])    a.) mild   Pulmonary hypertension ([place]) 02/24/2012   a.) TTE 02/24/2012: EF 55%, RVSP 33; b.) TTE 10/12/2014: EF >55%, RVSP 36.1; c.) TTE 09/02/2017: EF 55%, RVSP 45.5   Reflux esophagitis    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 b.) renal US 05/23/2020: solid LEFT renal mass measuring 1.4 x 1.5 x 1.6. 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, sclerodactyly, telangiectasias   Skin cancer 2018   Supplemental oxygen dependent    a.) 2L/[place] PRN for DOE related to mild pulmonary fibrosis and  pulmonary HTN   T2DM (type 2 diabetes mellitus) ([place])    Past Surgical History:  Procedure Laterality Date   ABDOMINAL AORTOGRAM W/LOWER EXTREMITY Left 05/16/2019   Procedure: ABDOMINAL AORTOGRAM W/LOWER EXTREMITY;  Surgeon: Lorretta Harp, MD;  Location: [place] [facility];  Service: Cardiovascular;  Laterality: Left;   AMPUTATION TOE Left 06/02/2019   Procedure: AMPUTATION TOE;  Surgeon: Albertine Patricia, DPM;  Location: [facility];  Service: Podiatry;  Laterality: Left;   APPENDECTOMY  1972   BREAST CYST ASPIRATION Left    neg   COLONOSCOPY WITH PROPOFOL N/A 09/26/2021   Procedure: COLONOSCOPY WITH PROPOFOL;  Surgeon: Annamaria Helling, DO;  Location: [facility] ENDOSCOPY;  Service: Gastroenterology;  Laterality: N/A;  DM, WHEELCHAIR, PLAVIX   ESOPHAGOGASTRODUODENOSCOPY (EGD) WITH PROPOFOL N/A 09/26/2021   Procedure: ESOPHAGOGASTRODUODENOSCOPY (EGD) WITH PROPOFOL;  Surgeon: Annamaria Helling, DO;  Location: [facility] ENDOSCOPY;  Service: Gastroenterology;  Laterality: N/A;   INCISION AND DRAINAGE OF WOUND Left 06/07/2019   Procedure: IRRIGATION AND DEBRIDEMENT WOUND;  Surgeon: Albertine Patricia, DPM;  Location: [facility]S;  Service: Podiatry;  Laterality: Left;   KNEE ARTHROPLASTY Right 11/13/2021   Procedure: COMPUTER ASSISTED TOTAL KNEE ARTHROPLASTY;  Surgeon: Dereck Leep, MD;  Location: [facility] OR[facility];  Service: Orthopedics;  Laterality: Right;   LOWER EXTREMITY ANGIOGRAPHY Left 06/03/2019   Procedure: Lower Extremity Angiography;  Surgeon: Katha Cabal, MD;  Location: [place] [facility];  Service: Cardiovascular;  Laterality: Left;   LOWER EXTREMITY ANGIOGRAPHY Right 05/21/2021   Procedure: LOWER EXTREMITY ANGIOGRAPHY;  Surgeon: Katha Cabal, MD;  Location: [place] [facility];  Service: Cardiovascular;  Laterality: Right;   PERIPHERAL VASCULAR BALLOON ANGIOPLASTY Left 05/16/2019   Procedure: PERIPHERAL VASCULAR BALLOON ANGIOPLASTY;  Surgeon: Lorretta Harp, MD;  Location: [place] [facility];  Service: Cardiovascular;  Laterality: Left;  SFA   TUBAL LIGATION  1972   VEIN LIGATION AND STRIPPING     Family History  Problem Relation Age of Onset   Arthritis Mother    Heart attack Father    Cancer Sister    Cancer - Prostate Brother    Colon cancer Neg Hx    Breast cancer Neg Hx    Social History   Socioeconomic History   Marital status: Married    Spouse name: [name]   Number of children: 2   Years of education: Not on file   Highest education level: Not on file  Occupational History   Not on file  Tobacco Use   Smoking status: Former    Packs/day: 0.50    Years: 15.00    Total pack years: 7.50    Types: Cigarettes    Quit date: 05/12/1988    Years since quitting: 33.6   Smokeless tobacco: Never  Vaping Use   Vaping Use: Never used  Substance and Sexual Activity   Alcohol use: No    Alcohol/week: 0.0 standard drinks of alcohol   Drug use: No   Sexual activity: Yes  Other Topics Concern   Not on file  Social History Narrative   Lives at home with spouse [name]    Social Determinants of Health   Financial Resource Strain: Low Risk  (11/20/2021)   Overall Financial Resource Strain (CARDIA)    Difficulty of Paying Living Expenses: Not hard at all  Food Insecurity: No Food Insecurity (11/20/2021)   Hunger Vital Sign    Worried About Running Out of Food in the Last Year: Never true    Ran Out of Food in the Last Year: Never true  Transportation Needs: No Transportation Needs (11/20/2021)   PRAPARE - Hydrologist (Medical): No    Lack of Transportation (Non-Medical): No  Physical Activity: Sufficiently Active (11/20/2021)   Exercise Vital Sign    Days of Exercise per Week: 3 days    Minutes of Exercise per Session: 60 min  Stress: No Stress Concern Present (11/20/2021)   [place]    Feeling of Stress : Not at all  Social Connections: [place] (11/20/2021)   Social Connection and Isolation Panel [NHANES]    Frequency of Communication with Friends and Family: More than three times a week    Frequency of Social Gatherings with Friends and Family: Not on file    Attends Religious Services: More than 4 times per year    Active Member of [employer] or Organizations: Yes    Attends Music therapist: More than 4 times per year    Marital Status: Married     Review of Systems     Objective:     BP 130/82 (BP Location: Left  Arm, Patient Position: Sitting, Cuff Size: Normal)   Pulse 82   Temp 97.8 F (36.6 C) (Oral)   Resp 16   Ht '5\' 6"'$  (1.676 m)   Wt 211 lb 12.8 oz (96.1 kg)   LMP 05/25/1988   SpO2 97%   BMI 34.19 kg/m  Wt Readings from Last 3 Encounters:  12/24/21 211 lb 12.8 oz (96.1 kg)  11/20/21 216 lb (98 kg)  11/13/21 216 lb (98 kg)    Physical Exam   Outpatient Encounter Medications as of 12/24/2021  Medication Sig   acetaminophen (TYLENOL) 650 MG CR tablet Take 650-1,300 mg by mouth every 8 (eight) hours as needed for pain.   allopurinol (ZYLOPRIM) 300 MG tablet Take 1 tablet by mouth once daily   aspirin 81 MG chewable tablet Chew 2 tablets (162 mg total) by mouth daily.   atorvastatin (LIPITOR) 40 MG tablet Take 1 tablet by mouth once daily   celecoxib (CELEBREX) 200 MG capsule Take 1 capsule (200 mg total) by mouth 2 (two) times daily.   clopidogrel (PLAVIX) 75 MG tablet Take 1 tablet by mouth once daily with breakfast   fexofenadine (ALLEGRA) 180 MG tablet Take 1 tablet (180 mg total) by mouth daily. (Patient taking differently: Take 180 mg by mouth daily as needed for allergies.)   folic acid (FOLVITE) 1 MG tablet Take 1 mg by mouth every evening.    glucose blood (BAYER CONTOUR TEST) test strip USE  STRIP TO CHECK GLUCOSE TWICE DAILY. Dx: E11.9   ipratropium (ATROVENT) 0.06 % nasal spray Place 2 sprays into both nostrils 4 (four) times daily. (Patient taking differently: Place 2 sprays  into both nostrils 4 (four) times daily as needed for rhinitis.)   losartan (COZAAR) 100 MG tablet Take 1 tablet by mouth once daily   magnesium oxide (MAG-OX) 400 (240 Mg) MG tablet Take 1 tablet (400 mg total) by mouth 2 (two) times daily.   metFORMIN (GLUCOPHAGE) 500 MG tablet Take 1 tablet by mouth once daily   methotrexate (RHEUMATREX) 2.5 MG tablet Take 10 mg by mouth every Wednesday.   metoprolol succinate (TOPROL-XL) 50 MG 24 hr tablet TAKE 1 TABLET BY MOUTH ONCE DAILY IMMEDIATELY  FOLLOWING  A  MEAL   MICROLET LANCETS MISC Check sugar once daily, [employer] Microlet Lancets. Dx E11.9   pantoprazole (PROTONIX) 40 MG tablet Take 1 tablet by mouth once daily   torsemide (DEMADEX) 20 MG tablet Take 20 mg by mouth as needed. Taking 1/2 tablet just as needed   traMADol (ULTRAM) 50 MG tablet Take 1 tablet (50 mg total) by mouth every 4 (four) hours as needed for moderate pain.   triamcinolone cream (KENALOG) 0.1 % Apply 1 application topically 2 (two) times daily. To lower legs before putting on and/or after removing compression hose for redness in legs   trospium (SANCTURA) 20 MG tablet Take 1 tablet (20 mg total) by mouth at bedtime.   [DISCONTINUED] oxyCODONE (OXY IR/ROXICODONE) 5 MG immediate release tablet Take 1 tablet (5 mg total) by mouth every 4 (four) hours as needed for severe pain. (Patient not taking: Reported on 12/24/2021)   No facility-administered encounter medications on file as of 12/24/2021.     Lab Results  Component Value Date   WBC 4.0 12/18/2021   HGB 12.2 12/18/2021   HCT 38.9 12/18/2021   PLT 136.0 (L) 12/18/2021   GLUCOSE 92 12/18/2021   CHOL 96 12/18/2021   TRIG 111.0 12/18/2021   HDL 38.50 (L) 12/18/2021  LDLCALC 35 12/18/2021   ALT 10 12/18/2021   AST 20 12/18/2021   NA 140 12/18/2021   K 4.4 12/18/2021   CL 104 12/18/2021   CREATININE 0.95 12/18/2021   BUN 17 12/18/2021   CO2 28 12/18/2021   TSH 4.50 12/18/2021   INR 1.1 03/25/2021   HGBA1C 6.0  12/18/2021   MICROALBUR 17.6 (H) 12/19/2021    DG Knee Right Port  Result Date: 11/13/2021 CLINICAL DATA:  Post RIGHT total knee arthroplasty EXAM: PORTABLE RIGHT KNEE - 1-2 VIEW COMPARISON:  Portable exam 1653 hours without priors for comparison FINDINGS: Osseous demineralization. Components of RIGHT knee prosthesis identified in expected positions. Anterior surgical drains. No fracture, dislocation, or bone destruction. IMPRESSION: RIGHT knee arthroplasty without acute abnormalities. Electronically Signed   By: Lavonia Dana M.D.   On: 11/13/2021 17:03       Assessment & Plan:   Problem List Items Addressed This Visit   None    Einar Pheasant, MD

## 2021-12-24 NOTE — Assessment & Plan Note (Addendum)
Physical today 12/24/21.  Mammogram

## 2021-12-25 ENCOUNTER — Encounter: Payer: Self-pay | Admitting: Internal Medicine

## 2021-12-25 LAB — MAGNESIUM: Magnesium: 1.2 mg/dL — ABNORMAL LOW (ref 1.5–2.5)

## 2021-12-25 NOTE — Assessment & Plan Note (Signed)
Follow cbc.  

## 2021-12-25 NOTE — Assessment & Plan Note (Signed)
On metformin.  Low carb diet and exercise.  Follow met b and a1c.

## 2021-12-25 NOTE — Assessment & Plan Note (Signed)
Recently evaluated by GI.  Recommended continuing protonix daily. Followed by GI.  

## 2021-12-25 NOTE — Assessment & Plan Note (Signed)
Discussed her nocturia and concern related to untreated sleep apnea.  Discussed the need to treat sleep apnea.  Discussed f/u with pulmonary.  Notify me if agreeable.

## 2021-12-25 NOTE — Assessment & Plan Note (Signed)
Avoid antiinflammatories.  Stay hydrated.  Follow metabolic panel.   

## 2021-12-25 NOTE — Assessment & Plan Note (Signed)
Swelling improved.  Follow.   

## 2021-12-25 NOTE — Assessment & Plan Note (Signed)
S/p angiogram, angioplasty and toe amputation.  Continue lipitor and plavix. Doing well.   

## 2021-12-25 NOTE — Assessment & Plan Note (Signed)
Magnesium low.  Discussed infusion.  She had declined.  Taking mag oxide now.  Just started bid.  Recheck today.  Discussed importance of magnesium being in the normal range.

## 2021-12-25 NOTE — Assessment & Plan Note (Signed)
Continue metoprolol, losartan and amlodipine.  Follow pressures.  Follow metabolic panel.  

## 2021-12-25 NOTE — Assessment & Plan Note (Signed)
No upper symptoms reported.  Continue protonix.  

## 2021-12-25 NOTE — Assessment & Plan Note (Signed)
saw Dr Bernardo Heater 12/17/20.  MRI - left renal angiomyolipoma.  Recommended f/u renal ultrasound in one year.

## 2021-12-25 NOTE — Assessment & Plan Note (Signed)
On MTX.  Followed by rheumatology.  

## 2021-12-25 NOTE — Assessment & Plan Note (Signed)
S/p amputation.  Followed by [facility].  

## 2021-12-25 NOTE — Assessment & Plan Note (Signed)
Followed by [facility]/rheumatology.  

## 2021-12-25 NOTE — Assessment & Plan Note (Signed)
S/p surgery.  Going to PT now.  Follow.

## 2021-12-25 NOTE — Assessment & Plan Note (Signed)
Continue f/u with pulmonary.  Discussed treatment for sleep apnea.  Notify me if agreeable.

## 2021-12-25 NOTE — Assessment & Plan Note (Signed)
Continue lipitor.  Low cholesterol diet and exercise.  Follow lipid panel and liver function tests.   

## 2021-12-25 NOTE — Assessment & Plan Note (Signed)
Continue lipitor  ?

## 2021-12-26 ENCOUNTER — Other Ambulatory Visit: Payer: Self-pay | Admitting: Internal Medicine

## 2021-12-30 ENCOUNTER — Other Ambulatory Visit: Payer: Medicare Other

## 2022-01-01 ENCOUNTER — Other Ambulatory Visit (INDEPENDENT_AMBULATORY_CARE_PROVIDER_SITE_OTHER): Payer: Medicare Other

## 2022-01-01 ENCOUNTER — Other Ambulatory Visit: Payer: Self-pay | Admitting: Family Medicine

## 2022-01-01 DIAGNOSIS — K2270 Barrett's esophagus without dysplasia: Secondary | ICD-10-CM

## 2022-01-01 DIAGNOSIS — N2889 Other specified disorders of kidney and ureter: Secondary | ICD-10-CM

## 2022-01-01 DIAGNOSIS — D1771 Benign lipomatous neoplasm of kidney: Secondary | ICD-10-CM

## 2022-01-01 LAB — MAGNESIUM: Magnesium: 1.2 mg/dL — ABNORMAL LOW (ref 1.5–2.5)

## 2022-01-02 ENCOUNTER — Ambulatory Visit: Payer: Medicare Other | Admitting: Urology

## 2022-01-02 ENCOUNTER — Other Ambulatory Visit: Payer: Self-pay | Admitting: Internal Medicine

## 2022-01-03 ENCOUNTER — Other Ambulatory Visit: Payer: Self-pay

## 2022-01-03 NOTE — Progress Notes (Signed)
Lab ordered.

## 2022-01-04 ENCOUNTER — Other Ambulatory Visit: Payer: Self-pay | Admitting: Internal Medicine

## 2022-01-04 NOTE — Progress Notes (Signed)
Order placed for f/u lab.   

## 2022-01-17 ENCOUNTER — Other Ambulatory Visit (INDEPENDENT_AMBULATORY_CARE_PROVIDER_SITE_OTHER): Payer: Medicare Other

## 2022-01-17 LAB — MAGNESIUM: Magnesium: 1.7 mg/dL (ref 1.5–2.5)

## 2022-01-20 ENCOUNTER — Telehealth: Payer: Self-pay

## 2022-01-20 NOTE — Telephone Encounter (Signed)
Patient states she would like for [place] to call her with her lab results as soon as we have them.  Patient states she cannot get MyChart to come up.

## 2022-01-23 ENCOUNTER — Ambulatory Visit
Admission: RE | Admit: 2022-01-23 | Discharge: 2022-01-23 | Disposition: A | Discharge status: Home / Self Care | Payer: Medicare Other | Source: Ambulatory Visit | Attending: Urology | Admitting: Urology

## 2022-01-23 DIAGNOSIS — D1771 Benign lipomatous neoplasm of kidney: Secondary | ICD-10-CM

## 2022-01-23 DIAGNOSIS — N2889 Other specified disorders of kidney and ureter: Secondary | ICD-10-CM | POA: Diagnosis present

## 2022-01-27 ENCOUNTER — Ambulatory Visit: Payer: Medicare Other | Admitting: Urology

## 2022-01-30 ENCOUNTER — Telehealth: Payer: Self-pay | Admitting: Family

## 2022-01-30 ENCOUNTER — Ambulatory Visit (INDEPENDENT_AMBULATORY_CARE_PROVIDER_SITE_OTHER): Payer: Medicare Other | Admitting: Urology

## 2022-01-30 ENCOUNTER — Other Ambulatory Visit: Payer: Self-pay | Admitting: Internal Medicine

## 2022-01-30 ENCOUNTER — Encounter: Payer: Self-pay | Admitting: Urology

## 2022-01-30 VITALS — BP 141/81 | HR 93 | Ht 66.0 in | Wt 206.0 lb

## 2022-01-30 DIAGNOSIS — D1771 Benign lipomatous neoplasm of kidney: Secondary | ICD-10-CM | POA: Diagnosis not present

## 2022-01-30 DIAGNOSIS — R351 Nocturia: Secondary | ICD-10-CM | POA: Diagnosis not present

## 2022-01-30 DIAGNOSIS — N2889 Other specified disorders of kidney and ureter: Secondary | ICD-10-CM

## 2022-01-30 NOTE — Progress Notes (Signed)
01/30/2022 9:04 AM   Nina Perez 1940/12/20 [id number]  Referring provider: Einar Pheasant, [address] [place],  [place] [address]  Chief Complaint  Patient presents with   Follow-up   Urologic history: 1.  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 female presents for annual follow-up.  Initially seen 11/21/2019 for renal ultrasound showing a 1.3 cm subtle area of increased echogenicity in the left lower pole 57-monthfollow-up ultrasound showed stable hypoechoic mass January 2022 and a 622-monthollow-up renal mass protocol MRI was recommended which was performed 12/04/2020 Renal mass protocol MRI showed a 1.2 cm macroscopic fat signal lesion inferior pole consistent with a small benign renal angiomyolipoma RUS 01/23/2022 with a 1.3 x 1.6 cm hyperechoic mass left lower pole consistent with AML  Still continues with nocturia every hour and no bothersome daytime symptoms She did have a sleep study and was diagnosed with sleep apnea however unable to tolerate CPAP Trial of trospium prescribed last year but apparently did not take  PMH: Past Medical History:  Diagnosis Date   Adenomatous colon polyp    Aortic atherosclerosis (HCC)    Barrett's esophagus    Cardiac murmur    CKD (chronic kidney disease), stage III ([place]   Dyspnea on exertion    GERD (gastroesophageal reflux disease)    Gout    Hiatal hernia    History of 2019 novel coronavirus disease (COVID-19) 08/2020   Hypercholesterolemia    Hypertension    ILD (interstitial lung disease) ([place]   a.) 2/2 scleroderma Dx   LBBB (left bundle branch block)    Long term current use of antithrombotics/antiplatelets    a.) ASA + clopidogrel   Long term current use of immunosuppressive drug    a.) MTX for RA and scleroderma Dx    OSA on CPAP    Osteoarthritis    a.) knees, spine   PAD (peripheral artery disease) (HCC)    Phlebitis    a.) x 2 (with pregnancy)   Pulmonary fibrosis ([place]   a.) mild   Pulmonary hypertension ([place]/15/2013   a.) TTE 02/24/2012: EF 55%, RVSP 33; b.) TTE 10/12/2014: EF >55%, RVSP 36.1; c.) TTE 09/02/2017: EF 55%, RVSP 45.5   Reflux esophagitis    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 b.) renal [place]/04/2021: solid LEFT renal mass measuring 1.4 x 1.5 x 1.6. 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's, sclerodactyly, telangiectasias   Skin cancer 2018   Supplemental oxygen dependent    a.) 2L/[place] PRN for DOE related to mild pulmonary fibrosis and pulmonary HTN   T2DM (type 2 diabetes mellitus) ([place]    Surgical History: Past Surgical History:  Procedure Laterality Date   ABDOMINAL AORTOGRAM W/LOWER EXTREMITY Left 05/16/2019   Procedure: ABDOMINAL AORTOGRAM W/LOWER EXTREMITY;  Surgeon: BeLorretta HarpMD;  [facility];  Service: Cardiovascular;  Laterality: Left;   AMPUTATION TOE Left 06/02/2019   Procedure: AMPUTATION TOE;  Surgeon: TrAlbertine Patricia  DPM;  Location: [facility];  Service: Podiatry;  Laterality: Left;   APPENDECTOMY  1972   BREAST CYST ASPIRATION Left    neg   COLONOSCOPY WITH PROPOFOL N/A 09/26/2021   Procedure: COLONOSCOPY WITH PROPOFOL;  Surgeon: Annamaria Helling, DO;  Location: [facility] ENDOSCOPY;  Service: Gastroenterology;  Laterality: N/A;  DM, WHEELCHAIR, PLAVIX   ESOPHAGOGASTRODUODENOSCOPY (EGD) WITH PROPOFOL N/A 09/26/2021   Procedure: ESOPHAGOGASTRODUODENOSCOPY (EGD) WITH PROPOFOL;  Surgeon: Annamaria Helling, DO;  Location: [facility] ENDOSCOPY;   Service: Gastroenterology;  Laterality: N/A;   INCISION AND DRAINAGE OF WOUND Left 06/07/2019   Procedure: IRRIGATION AND DEBRIDEMENT WOUND;  Surgeon: Albertine Patricia, DPM;  Location: [facility]S;  Service: Podiatry;  Laterality: Left;   KNEE ARTHROPLASTY Right 11/13/2021   Procedure: COMPUTER ASSISTED TOTAL KNEE ARTHROPLASTY;  Surgeon: Dereck Leep, MD;  Location: [facility] ORS;  Service: Orthopedics;  Laterality: Right;   LOWER EXTREMITY ANGIOGRAPHY Left 06/03/2019   Procedure: Lower Extremity Angiography;  Surgeon: Katha Cabal, MD;  Location: [place] [facility];  Service: Cardiovascular;  Laterality: Left;   LOWER EXTREMITY ANGIOGRAPHY Right 05/21/2021   Procedure: LOWER EXTREMITY ANGIOGRAPHY;  Surgeon: Katha Cabal, MD;  Location: [place] [facility];  Service: Cardiovascular;  Laterality: Right;   PERIPHERAL VASCULAR BALLOON ANGIOPLASTY Left 05/16/2019   Procedure: PERIPHERAL VASCULAR BALLOON ANGIOPLASTY;  Surgeon: Lorretta Harp, MD;  Location: [place] [facility];  Service: Cardiovascular;  Laterality: Left;  SFA   TUBAL LIGATION  1972   VEIN LIGATION AND STRIPPING      Home Medications:  Allergies as of 01/30/2022       Reactions   Maxzide [triamterene-hctz] Other (See Comments)   Weakness/fatigue   Tape Other (See Comments)   Ob band aid adhesive  Pull skin off / Rash   Cephalexin Rash   Blotchiness to B lower legs after several days of cephalexin for UTI   Hydrochlorothiazide W-triamterene    Other reaction(s): Other (See Comments) Extreme fatigue/weakness        Medication List        Accurate as of January 30, 2022  9:04 AM. If you have any questions, ask your nurse or doctor.          acetaminophen 650 MG CR tablet Commonly known as: TYLENOL Take 650-1,300 mg by mouth every 8 (eight) hours as needed for pain.   allopurinol 300 MG tablet Commonly known as: ZYLOPRIM Take 1 tablet by mouth once daily   aspirin 81 MG chewable tablet Chew 2  tablets (162 mg total) by mouth daily.   atorvastatin 40 MG tablet Commonly known as: LIPITOR Take 1 tablet by mouth once daily   celecoxib 200 MG capsule Commonly known as: CELEBREX Take 1 capsule (200 mg total) by mouth 2 (two) times daily.   clopidogrel 75 MG tablet Commonly known as: PLAVIX Take 1 tablet by mouth once daily with breakfast   fexofenadine 180 MG tablet Commonly known as: ALLEGRA Take 1 tablet (180 mg total) by mouth daily. What changed:  when to take this reasons to take this   folic acid 1 MG tablet Commonly known as: FOLVITE Take 1 mg by mouth every evening.   glucose blood test strip Commonly known as: Estate manager/land agent USE  STRIP TO CHECK GLUCOSE TWICE DAILY. Dx: E11.9   ipratropium 0.06 % nasal spray Commonly known as: ATROVENT Place 2 sprays into both nostrils 4 (four) times daily. What changed:  when to take this reasons to take this  losartan 100 MG tablet Commonly known as: COZAAR Take 1 tablet by mouth once daily   magnesium oxide 400 (240 Mg) MG tablet Commonly known as: MAG-OX Take 1 tablet (400 mg total) by mouth 2 (two) times daily.   metFORMIN 500 MG tablet Commonly known as: GLUCOPHAGE Take 1 tablet by mouth once daily   methotrexate 2.5 MG tablet Commonly known as: RHEUMATREX Take 10 mg by mouth every Wednesday.   metoprolol succinate 50 MG 24 hr tablet Commonly known as: TOPROL-XL TAKE 1 TABLET BY MOUTH ONCE DAILY IMMEDIATELY  FOLLOWING  A  MEAL   Microlet Lancets Misc Check sugar once daily, [employer] Microlet Lancets. Dx E11.9   pantoprazole 40 MG tablet Commonly known as: PROTONIX Take 1 tablet by mouth once daily   torsemide 20 MG tablet Commonly known as: DEMADEX Take 20 mg by mouth as needed. Taking 1/2 tablet just as needed   traMADol 50 MG tablet Commonly known as: ULTRAM Take 1 tablet (50 mg total) by mouth every 4 (four) hours as needed for moderate pain.   triamcinolone cream 0.1 % Commonly known  as: KENALOG Apply 1 application topically 2 (two) times daily. To lower legs before putting on and/or after removing compression hose for redness in legs   trospium 20 MG tablet Commonly known as: SANCTURA Take 1 tablet (20 mg total) by mouth at bedtime.        Allergies:  Allergies  Allergen Reactions   Maxzide [Triamterene-Hctz] Other (See Comments)    Weakness/fatigue    Tape Other (See Comments)    Ob band aid adhesive  Pull skin off / Rash   Cephalexin Rash    Blotchiness to B lower legs after several days of cephalexin for UTI   Hydrochlorothiazide W-Triamterene     Other reaction(s): Other (See Comments) Extreme fatigue/weakness    Family History: Family History  Problem Relation Age of Onset   Arthritis Mother    Heart attack Father    Cancer Sister    Cancer - Prostate Brother    Colon cancer Neg Hx    Breast cancer Neg Hx     Social History:  reports that she quit smoking about 33 years ago. Her smoking use included cigarettes. She has a 7.50 pack-year smoking history. She has never used smokeless tobacco. She reports that she does not drink alcohol and does not use drugs.   Physical Exam: BP (!) 141/81   Pulse 93   Ht '5\' 6"'$  (1.676 m)   Wt 206 lb (93.4 kg)   LMP 05/25/1988   BMI 33.25 kg/m   Constitutional:  Alert and oriented, No acute distress. HEENT: [place] AT, moist mucus membranes.  Trachea midline, no masses. Cardiovascular: No clubbing, cyanosis, or edema. Respiratory: Normal respiratory effort, no increased work of breathing.   Assessment & Plan:    1.  Left renal angiomyolipoma Stable AML Follow up 1 year with RUS  2.  Nocturia Trial trospium qhs X 30 days Recommend follow-up with CPAP prescriber to discuss other potential options We also discussed possibility of nocturnal polyuria though based on age would not recommend desmopressin   Abbie Sons, [place] [address], [place], [place] [address] [phone]

## 2022-02-01 ENCOUNTER — Encounter: Payer: Self-pay | Admitting: Urology

## 2022-02-03 ENCOUNTER — Other Ambulatory Visit: Payer: Self-pay | Admitting: *Deleted

## 2022-02-03 ENCOUNTER — Other Ambulatory Visit: Payer: Self-pay

## 2022-02-03 MED ORDER — LOSARTAN POTASSIUM 100 MG PO TABS: 100.0000 mg | ORAL_TABLET | Freq: Every day | ORAL | 0 refills | Status: DC

## 2022-02-03 MED ORDER — TROSPIUM CHLORIDE 20 MG PO TABS: 20.0000 mg | ORAL_TABLET | Freq: Every day | ORAL | 0 refills | Status: DC

## 2022-02-03 NOTE — Telephone Encounter (Signed)
Patient is requesting a refill on her losartan (COZAAR) 100 MG tablet.

## 2022-02-03 NOTE — Telephone Encounter (Signed)
Pt notified that prescription was sent.

## 2022-02-04 ENCOUNTER — Ambulatory Visit
Admission: RE | Admit: 2022-02-04 | Discharge: 2022-02-04 | Disposition: A | Discharge status: Home / Self Care | Payer: Medicare Other | Source: Ambulatory Visit | Attending: Internal Medicine | Admitting: Internal Medicine

## 2022-02-04 DIAGNOSIS — Z1231 Encounter for screening mammogram for malignant neoplasm of breast: Secondary | ICD-10-CM | POA: Insufficient documentation

## 2022-02-06 ENCOUNTER — Encounter (INDEPENDENT_AMBULATORY_CARE_PROVIDER_SITE_OTHER): Payer: Medicare Other

## 2022-02-06 ENCOUNTER — Ambulatory Visit (INDEPENDENT_AMBULATORY_CARE_PROVIDER_SITE_OTHER): Payer: Medicare Other | Admitting: Vascular Surgery

## 2022-02-13 ENCOUNTER — Ambulatory Visit (INDEPENDENT_AMBULATORY_CARE_PROVIDER_SITE_OTHER): Payer: Medicare Other

## 2022-02-13 DIAGNOSIS — Z23 Encounter for immunization: Secondary | ICD-10-CM | POA: Diagnosis not present

## 2022-03-10 ENCOUNTER — Encounter (INDEPENDENT_AMBULATORY_CARE_PROVIDER_SITE_OTHER): Payer: Self-pay

## 2022-03-13 ENCOUNTER — Other Ambulatory Visit: Payer: Self-pay

## 2022-03-13 ENCOUNTER — Telehealth: Payer: Self-pay | Admitting: Internal Medicine

## 2022-03-13 DIAGNOSIS — E7800 Pure hypercholesterolemia, unspecified: Secondary | ICD-10-CM

## 2022-03-13 DIAGNOSIS — E1159 Type 2 diabetes mellitus with other circulatory complications: Secondary | ICD-10-CM

## 2022-03-13 DIAGNOSIS — I10 Essential (primary) hypertension: Secondary | ICD-10-CM

## 2022-03-13 DIAGNOSIS — D696 Thrombocytopenia, unspecified: Secondary | ICD-10-CM

## 2022-03-13 NOTE — Telephone Encounter (Signed)
Patient has a lab appt 03/20/2022, there are no orders in.

## 2022-03-13 NOTE — Addendum Note (Signed)
Addended by: Alisa Graff on: 03/13/2022 08:33 PM   Modules accepted: Orders

## 2022-03-13 NOTE — Telephone Encounter (Signed)
Orders placed.

## 2022-03-13 NOTE — Telephone Encounter (Signed)
Orders placed for future labs.

## 2022-03-20 ENCOUNTER — Telehealth: Payer: Self-pay | Admitting: Internal Medicine

## 2022-03-21 ENCOUNTER — Other Ambulatory Visit (INDEPENDENT_AMBULATORY_CARE_PROVIDER_SITE_OTHER): Payer: Medicare Other

## 2022-03-21 DIAGNOSIS — I10 Essential (primary) hypertension: Secondary | ICD-10-CM

## 2022-03-21 DIAGNOSIS — E7800 Pure hypercholesterolemia, unspecified: Secondary | ICD-10-CM

## 2022-03-21 DIAGNOSIS — E1159 Type 2 diabetes mellitus with other circulatory complications: Secondary | ICD-10-CM

## 2022-03-21 DIAGNOSIS — D696 Thrombocytopenia, unspecified: Secondary | ICD-10-CM

## 2022-03-21 LAB — CBC WITH DIFFERENTIAL/PLATELET
Basophils Absolute: 0 10*3/uL (ref 0.0–0.1)
Basophils Relative: 0.9 % (ref 0.0–3.0)
Eosinophils Absolute: 0 10*3/uL (ref 0.0–0.7)
Eosinophils Relative: 1.1 % (ref 0.0–5.0)
HCT: 38.6 % (ref 36.0–46.0)
Hemoglobin: 12.1 g/dL (ref 12.0–15.0)
Lymphocytes Relative: 16.4 % (ref 12.0–46.0)
Lymphs Abs: 0.7 10*3/uL (ref 0.7–4.0)
MCHC: 31.4 g/dL (ref 30.0–36.0)
MCV: 100.4 fl — ABNORMAL HIGH (ref 78.0–100.0)
Monocytes Absolute: 0.4 10*3/uL (ref 0.1–1.0)
Monocytes Relative: 8.5 % (ref 3.0–12.0)
Neutro Abs: 3.2 10*3/uL (ref 1.4–7.7)
Neutrophils Relative %: 73.1 % (ref 43.0–77.0)
Platelets: 119 10*3/uL — ABNORMAL LOW (ref 150.0–400.0)
RBC: 3.84 Mil/uL — ABNORMAL LOW (ref 3.87–5.11)
RDW: 21.2 % — ABNORMAL HIGH (ref 11.5–15.5)
WBC: 4.4 10*3/uL (ref 4.0–10.5)

## 2022-03-21 LAB — LIPID PANEL
Cholesterol: 93 mg/dL (ref 0–200)
HDL: 37.1 mg/dL — ABNORMAL LOW (ref >39.00)
LDL Cholesterol: 32 mg/dL (ref 0–99)
NonHDL: 56.28
Total CHOL/HDL Ratio: 3
Triglycerides: 122 mg/dL (ref 0.0–149.0)
VLDL: 24.4 mg/dL (ref 0.0–40.0)

## 2022-03-21 LAB — HEMOGLOBIN A1C: Hgb A1c MFr Bld: 6.1 % (ref 4.6–6.5)

## 2022-03-21 LAB — HEPATIC FUNCTION PANEL
ALT: 17 U/L (ref 0–35)
AST: 25 U/L (ref 0–37)
Albumin: 3.6 g/dL (ref 3.5–5.2)
Alkaline Phosphatase: 77 U/L (ref 39–117)
Bilirubin, Direct: 0.2 mg/dL (ref 0.0–0.3)
Total Bilirubin: 0.7 mg/dL (ref 0.2–1.2)
Total Protein: 6.6 g/dL (ref 6.0–8.3)

## 2022-03-21 LAB — BASIC METABOLIC PANEL
BUN: 21 mg/dL (ref 6–23)
CO2: 30 mEq/L (ref 19–32)
Calcium: 10.1 mg/dL (ref 8.4–10.5)
Chloride: 105 mEq/L (ref 96–112)
Creatinine, Ser: 1.12 mg/dL (ref 0.40–1.20)
GFR: 46.34 mL/min — ABNORMAL LOW (ref >60.00)
Glucose, Bld: 96 mg/dL (ref 70–99)
Potassium: 4.4 mEq/L (ref 3.5–5.1)
Sodium: 140 mEq/L (ref 135–145)

## 2022-03-21 LAB — TSH: TSH: 4.73 u[IU]/mL (ref 0.35–5.50)

## 2022-03-21 LAB — MAGNESIUM: Magnesium: 1.8 mg/dL (ref 1.5–2.5)

## 2022-03-26 ENCOUNTER — Ambulatory Visit: Payer: Medicare Other | Admitting: Internal Medicine

## 2022-03-26 NOTE — Progress Notes (Deleted)
Patient ID: Nina Perez, female   DOB: [birth year], 81 y.o.   MRN: [id number]   Subjective:    Patient ID: Nina Perez, female    DOB: 06-20-1940, 81 y.o.   MRN: [id number]   Patient here for No chief complaint on file.  Marland Kitchen   HPI Here to follow up regarding her cholesterol and blood pressure.  S/p right TKA 11/13/21.  Followed by ortho. Saw urology 01/30/22 - stable AML.  Recommended f/u in one year (renal ultrasound).  Also recommended trial trospium for nocturia.  Saw cardiology 01/21/22 - f/u cardiomyopathy - EF 30-35%.  Recommended lexiscan.  Pt elected to hold. Saw rheumatology 12/27/21 - on MTX and allopurinol.     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[place])    a.) 2/2 scleroderma Dx   LBBB (left bundle branch block)    Long term current use of antithrombotics/antiplatelets    a.) ASA + clopidogrel   Long term current use of immunosuppressive drug    a.) MTX for RA and scleroderma Dx   OSA on CPAP    Osteoarthritis    a.) knees, spine   PAD (peripheral artery disease) (HCC)    Phlebitis    a.) x 2 (with pregnancy)   Pulmonary fibrosis ([place])    a.) mild   Pulmonary hypertension ([place]) 02/24/2012   a.) TTE 02/24/2012: EF 55%, RVSP 33; b.) TTE 10/12/2014: EF >55%, RVSP 36.1; c.) TTE 09/02/2017: EF 55%, RVSP 45.5   Reflux esophagitis    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 b.) renal US 05/23/2020: solid LEFT renal mass measuring 1.4 x 1.5 x 1.6. 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, sclerodactyly, telangiectasias   Skin cancer 2018   Supplemental oxygen dependent    a.) 2L/[place] PRN for DOE related to mild pulmonary fibrosis and pulmonary HTN   T2DM (type 2 diabetes mellitus) ([place])    Past Surgical History:  Procedure Laterality Date   ABDOMINAL AORTOGRAM W/LOWER EXTREMITY Left 05/16/2019   Procedure: ABDOMINAL AORTOGRAM W/LOWER EXTREMITY;  Surgeon: Lorretta Harp, MD;  Location: [facility];  Service: Cardiovascular;  Laterality: Left;   AMPUTATION TOE Left 06/02/2019   Procedure: AMPUTATION TOE;  Surgeon: Albertine Patricia, DPM;  Location: [facility];  Service: Podiatry;  Laterality: Left;   APPENDECTOMY  1972   BREAST CYST ASPIRATION Left    neg   COLONOSCOPY WITH PROPOFOL N/A 09/26/2021   Procedure: COLONOSCOPY WITH PROPOFOL;  Surgeon: Annamaria Helling, DO;  Location: [facility];  Service: Gastroenterology;  Laterality: N/A;  DM, WHEELCHAIR, PLAVIX   ESOPHAGOGASTRODUODENOSCOPY (EGD) WITH PROPOFOL N/A 09/26/2021   Procedure: ESOPHAGOGASTRODUODENOSCOPY (EGD) WITH PROPOFOL;  Surgeon: Annamaria Helling, DO;  Location: [facility];  Service: Gastroenterology;  Laterality: N/A;   INCISION AND DRAINAGE OF WOUND Left 06/07/2019   Procedure: IRRIGATION AND DEBRIDEMENT WOUND;  Surgeon: Albertine Patricia, DPM;  Location: [facility];  Service: Podiatry;  Laterality: Left;   KNEE ARTHROPLASTY Right 11/13/2021   Procedure: COMPUTER ASSISTED TOTAL KNEE ARTHROPLASTY;  Surgeon: Dereck Leep, MD;  Location: [facility];  Service: Orthopedics;  Laterality: Right;   LOWER EXTREMITY ANGIOGRAPHY Left 06/03/2019   Procedure: Lower Extremity Angiography;  Surgeon: Katha Cabal, MD;  Location: [facility];  Service: Cardiovascular;  Laterality: Left;   LOWER EXTREMITY ANGIOGRAPHY  Right 05/21/2021   Procedure: LOWER EXTREMITY ANGIOGRAPHY;  Surgeon: Katha Cabal, MD;  Location: [facility];  Service: Cardiovascular;  Laterality: Right;   PERIPHERAL VASCULAR BALLOON ANGIOPLASTY Left 05/16/2019   Procedure: PERIPHERAL VASCULAR BALLOON ANGIOPLASTY;  Surgeon: Lorretta Harp, MD;  Location: [facility];  Service: Cardiovascular;  Laterality: Left;  SFA   TUBAL LIGATION  1972   VEIN LIGATION AND STRIPPING     Family History  Problem Relation Age of Onset   Arthritis Mother    Heart attack Father    Cancer Sister    Cancer - Prostate Brother    Colon cancer Neg Hx    Breast cancer Neg Hx    Social History   Socioeconomic History   Marital status: Married    Spouse name: [name]   Number of children: 2   Years of education: Not on file   Highest education level: Not on file  Occupational History   Not on file  Tobacco Use   Smoking status: Former    Packs/day: 0.50    Years: 15.00    Total pack years: 7.50    Types: Cigarettes    Quit date: 05/12/1988    Years since quitting: 33.8   Smokeless tobacco: Never  Vaping Use   Vaping Use: Never used  Substance and Sexual Activity   Alcohol use: No    Alcohol/week: 0.0 standard drinks of alcohol   Drug use: No   Sexual activity: Yes  Other Topics Concern   Not on file  Social History Narrative   Lives at home with spouse [name]    Social Determinants of Health   Financial Resource Strain: Low Risk  (11/20/2021)   Overall Financial Resource Strain (CARDIA)    Difficulty of Paying Living Expenses: Not hard at all  Food Insecurity: No Food Insecurity (11/20/2021)   Hunger Vital Sign    Worried About Running Out of Food in the Last Year: Never true    Ran Out of Food in the Last Year: Never true  Transportation Needs: No Transportation Needs (11/20/2021)   PRAPARE - Hydrologist (Medical): No    Lack of Transportation (Non-Medical): No  Physical Activity:  Sufficiently Active (11/20/2021)   Exercise Vital Sign    Days of Exercise per Week: 3 days    Minutes of Exercise per Session: 60 min  Stress: No Stress Concern Present (11/20/2021)   [place]    Feeling of Stress : Not at all  Social Connections: [place] (11/20/2021)   Social Connection and Isolation Panel [NHANES]    Frequency of Communication with Friends and Family: More than three times a week    Frequency of Social Gatherings with Friends and Family: Not on file    Attends Religious Services: More than 4 times per year  Active Member of Clubs or Organizations: Yes    Attends Archivist Meetings: More than 4 times per year    Marital Status: Married     Review of Systems     Objective:     LMP 05/25/1988  Wt Readings from Last 3 Encounters:  01/30/22 206 lb (93.4 kg)  12/24/21 211 lb 12.8 oz (96.1 kg)  11/20/21 216 lb (98 kg)    Physical Exam   Outpatient Encounter Medications as of 03/26/2022  Medication Sig   acetaminophen (TYLENOL) 650 MG CR tablet Take 650-1,300 mg by mouth every 8 (eight) hours as needed for pain.   allopurinol (ZYLOPRIM) 300 MG tablet Take 1 tablet by mouth once daily   aspirin 81 MG chewable tablet Chew 2 tablets (162 mg total) by mouth daily.   atorvastatin (LIPITOR) 40 MG tablet Take 1 tablet by mouth once daily   celecoxib (CELEBREX) 200 MG capsule Take 1 capsule (200 mg total) by mouth 2 (two) times daily.   clopidogrel (PLAVIX) 75 MG tablet Take 1 tablet by mouth once daily with breakfast   fexofenadine (ALLEGRA) 180 MG tablet Take 1 tablet (180 mg total) by mouth daily. (Patient taking differently: Take 180 mg by mouth daily as needed for allergies.)   folic acid (FOLVITE) 1 MG tablet Take 1 mg by mouth every evening.    glucose blood (BAYER CONTOUR TEST) test strip USE  STRIP TO CHECK GLUCOSE TWICE DAILY. Dx: E11.9   ipratropium (ATROVENT) 0.06 %  nasal spray Place 2 sprays into both nostrils 4 (four) times daily. (Patient taking differently: Place 2 sprays into both nostrils 4 (four) times daily as needed for rhinitis.)   losartan (COZAAR) 100 MG tablet Take 1 tablet (100 mg total) by mouth daily.   magnesium oxide (MAG-OX) 400 (240 Mg) MG tablet Take 1 tablet (400 mg total) by mouth 2 (two) times daily.   metFORMIN (GLUCOPHAGE) 500 MG tablet Take 1 tablet by mouth once daily   methotrexate (RHEUMATREX) 2.5 MG tablet Take 10 mg by mouth every Wednesday.   metoprolol succinate (TOPROL-XL) 50 MG 24 hr tablet TAKE 1 TABLET BY MOUTH ONCE DAILY IMMEDIATELY  FOLLOWING  A  MEAL   MICROLET LANCETS MISC Check sugar once daily, [employer] Microlet Lancets. Dx E11.9   pantoprazole (PROTONIX) 40 MG tablet Take 1 tablet by mouth once daily   torsemide (DEMADEX) 20 MG tablet Take 20 mg by mouth as needed. Taking 1/2 tablet just as needed   traMADol (ULTRAM) 50 MG tablet Take 1 tablet (50 mg total) by mouth every 4 (four) hours as needed for moderate pain.   triamcinolone cream (KENALOG) 0.1 % Apply 1 application topically 2 (two) times daily. To lower legs before putting on and/or after removing compression hose for redness in legs   trospium (SANCTURA) 20 MG tablet Take 1 tablet (20 mg total) by mouth at bedtime.   No facility-administered encounter medications on file as of 03/26/2022.     Lab Results  Component Value Date   WBC 4.4 03/21/2022   HGB 12.1 03/21/2022   HCT 38.6 03/21/2022   PLT 119.0 (L) 03/21/2022   GLUCOSE 96 03/21/2022   CHOL 93 03/21/2022   TRIG 122.0 03/21/2022   HDL 37.10 (L) 03/21/2022   LDLCALC 32 03/21/2022   ALT 17 03/21/2022   AST 25 03/21/2022   NA 140 03/21/2022   K 4.4 03/21/2022   CL 105 03/21/2022   CREATININE 1.12 03/21/2022   BUN 21 03/21/2022  CO2 30 03/21/2022   TSH 4.73 03/21/2022   INR 1.1 03/25/2021   HGBA1C 6.1 03/21/2022   MICROALBUR 17.6 (H) 12/19/2021    MM 3D SCREEN BREAST  BILATERAL  Result Date: 02/05/2022 CLINICAL DATA:  Screening. EXAM: DIGITAL SCREENING BILATERAL MAMMOGRAM WITH TOMOSYNTHESIS AND CAD TECHNIQUE: Bilateral screening digital craniocaudal and mediolateral oblique mammograms were obtained. Bilateral screening digital breast tomosynthesis was performed. The images were evaluated with computer-aided detection. COMPARISON:  Previous exam(s). ACR Breast Density Category b: There are scattered areas of fibroglandular density. FINDINGS: There are no findings suspicious for malignancy. IMPRESSION: No mammographic evidence of malignancy. A result letter of this screening mammogram will be mailed directly to the patient. RECOMMENDATION: Screening mammogram in one year. (Code:[id number]-B-01Y) BI-RADS CATEGORY  1: Negative. Electronically Signed   By: Ammie Ferrier M.D.   On: 02/05/2022 12:37       Assessment & Plan:   Problem List Items Addressed This Visit   None    Einar Pheasant, MD

## 2022-03-27 ENCOUNTER — Other Ambulatory Visit: Payer: Self-pay | Admitting: Internal Medicine

## 2022-04-03 ENCOUNTER — Other Ambulatory Visit: Payer: Self-pay | Admitting: Internal Medicine

## 2022-04-06 ENCOUNTER — Encounter: Payer: Self-pay | Admitting: Internal Medicine

## 2022-04-06 NOTE — Progress Notes (Signed)
Patient ID: Nina Perez, female   DOB: [birth year], 81 y.o.   MRN: [id number] Did not show for appt.

## 2022-04-09 ENCOUNTER — Telehealth: Payer: Self-pay | Admitting: Internal Medicine

## 2022-04-09 NOTE — Telephone Encounter (Signed)
Magnesium level was better last check 03/21/22, but still at the lower end of normal.  Does need to be on magnesium supplements.  We can recheck her magnesium level and see if maintaining at at good range.  (Magnesium glycinate may have less GI side effects).

## 2022-04-09 NOTE — Telephone Encounter (Signed)
Pt called stating she is taking four magnesium tablets everyday and her bowels are loose. Pt want to know if she is able to decrease in how many she takes

## 2022-04-09 NOTE — Telephone Encounter (Signed)
Pt husband advised.

## 2022-04-14 ENCOUNTER — Ambulatory Visit (INDEPENDENT_AMBULATORY_CARE_PROVIDER_SITE_OTHER): Payer: Medicare Other | Admitting: Internal Medicine

## 2022-04-14 ENCOUNTER — Other Ambulatory Visit: Payer: Self-pay

## 2022-04-14 ENCOUNTER — Encounter: Payer: Self-pay | Admitting: Internal Medicine

## 2022-04-14 VITALS — BP 134/84 | HR 73 | Temp 97.8°F | Ht 66.0 in | Wt 203.8 lb

## 2022-04-14 DIAGNOSIS — I70221 Atherosclerosis of native arteries of extremities with rest pain, right leg: Secondary | ICD-10-CM | POA: Diagnosis not present

## 2022-04-14 DIAGNOSIS — R198 Other specified symptoms and signs involving the digestive system and abdomen: Secondary | ICD-10-CM

## 2022-04-14 DIAGNOSIS — K2100 Gastro-esophageal reflux disease with esophagitis, without bleeding: Secondary | ICD-10-CM

## 2022-04-14 DIAGNOSIS — I272 Pulmonary hypertension, unspecified: Secondary | ICD-10-CM

## 2022-04-14 DIAGNOSIS — Z89422 Acquired absence of other left toe(s): Secondary | ICD-10-CM | POA: Diagnosis not present

## 2022-04-14 DIAGNOSIS — E7800 Pure hypercholesterolemia, unspecified: Secondary | ICD-10-CM

## 2022-04-14 DIAGNOSIS — I700 Atherosclerosis of aorta: Secondary | ICD-10-CM

## 2022-04-14 DIAGNOSIS — D1771 Benign lipomatous neoplasm of kidney: Secondary | ICD-10-CM

## 2022-04-14 DIAGNOSIS — Z86010 Personal history of colonic polyps: Secondary | ICD-10-CM

## 2022-04-14 DIAGNOSIS — E1159 Type 2 diabetes mellitus with other circulatory complications: Secondary | ICD-10-CM

## 2022-04-14 DIAGNOSIS — K2270 Barrett's esophagus without dysplasia: Secondary | ICD-10-CM

## 2022-04-14 DIAGNOSIS — M341 CR(E)ST syndrome: Secondary | ICD-10-CM

## 2022-04-14 DIAGNOSIS — H9390 Unspecified disorder of ear, unspecified ear: Secondary | ICD-10-CM

## 2022-04-14 DIAGNOSIS — I10 Essential (primary) hypertension: Secondary | ICD-10-CM | POA: Diagnosis not present

## 2022-04-14 DIAGNOSIS — N1831 Chronic kidney disease, stage 3a: Secondary | ICD-10-CM

## 2022-04-14 DIAGNOSIS — G8929 Other chronic pain: Secondary | ICD-10-CM

## 2022-04-14 DIAGNOSIS — M0579 Rheumatoid arthritis with rheumatoid factor of multiple sites without organ or systems involvement: Secondary | ICD-10-CM

## 2022-04-14 DIAGNOSIS — M25569 Pain in unspecified knee: Secondary | ICD-10-CM

## 2022-04-14 DIAGNOSIS — D696 Thrombocytopenia, unspecified: Secondary | ICD-10-CM

## 2022-04-14 NOTE — Telephone Encounter (Signed)
Pt advised since she has gotten magnesium, she is to take it 1 BID. Pt scheduled for magnesium in 2 weeks - 12/18

## 2022-04-14 NOTE — Telephone Encounter (Signed)
Patient saw Dr Nicki Reaper today. Dr Nicki Reaper changed her magnesium today and wanted to know if patient received the new magnesium, patient has.

## 2022-04-14 NOTE — Patient Instructions (Signed)
Magnesium glycinate - one tablet twice a day  benefiber

## 2022-04-14 NOTE — Progress Notes (Signed)
Patient ID: Nina Perez, female   DOB: November 19, 1940, 81 y.o.   MRN: [id number]   Subjective:    Patient ID: Nina Perez, female    DOB: [birth year], 81 y.o.   MRN: [id number]   Patient here for  Chief Complaint  Patient presents with   Follow-up    3 month follow up    .   HPI Here to follow up regarding her blood pressure, blood sugar and cholesterol.  She is accompanied by her husband.  History obtained from both of them.  She is s/p right TKR 11/13/21.  Right knee doing ok.  Left knee has started bothering her.  Followed by ortho.  Has noticed when urinates - some soft stool.  Feels being aggravated by the increased magnesium. No abdominal pain.  No diarrhea.  Eating.  No nausea or vomiting.  No chest pain.  Breathing stable.     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[place])    a.) 2/2 scleroderma Dx   LBBB (left bundle branch block)    Long term current use of antithrombotics/antiplatelets    a.) ASA + clopidogrel   Long term current use of immunosuppressive drug    a.) MTX for RA and scleroderma Dx   OSA on CPAP    Osteoarthritis    a.) knees, spine   PAD (peripheral artery disease) (HCC)    Phlebitis    a.) x 2 (with pregnancy)   Pulmonary fibrosis ([place])    a.) mild   Pulmonary hypertension ([place]) 02/24/2012   a.) TTE 02/24/2012: EF 55%, RVSP 33; b.) TTE 10/12/2014: EF >55%, RVSP 36.1; c.) TTE 09/02/2017: EF 55%, RVSP 45.5   Reflux esophagitis    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 b.) renal US 05/23/2020:  solid LEFT renal mass measuring 1.4 x 1.5 x 1.6. 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 --> raynaud's, sclerodactyly, telangiectasias   Skin cancer 2018   Supplemental oxygen dependent    a.) 2L/Guys PRN for DOE related to mild pulmonary fibrosis and pulmonary HTN   T2DM (type 2 diabetes mellitus) ([place])    Past Surgical History:  Procedure Laterality Date   ABDOMINAL AORTOGRAM W/LOWER EXTREMITY Left 05/16/2019   Procedure: ABDOMINAL AORTOGRAM W/LOWER EXTREMITY;  Surgeon: Lorretta Harp, MD;  Location: [facility];  Service: Cardiovascular;  Laterality: Left;   AMPUTATION TOE Left 06/02/2019   Procedure: AMPUTATION TOE;  Surgeon: Albertine Patricia, DPM;  Location: [facility];  Service: Podiatry;  Laterality: Left;   APPENDECTOMY  1972   BREAST CYST ASPIRATION Left    neg   COLONOSCOPY WITH PROPOFOL N/A 09/26/2021   Procedure: COLONOSCOPY WITH PROPOFOL;  Surgeon: Annamaria Helling, DO;  Location: [facility];  Service: Gastroenterology;  Laterality: N/A;  DM, WHEELCHAIR, PLAVIX   ESOPHAGOGASTRODUODENOSCOPY (EGD) WITH PROPOFOL N/A 09/26/2021   Procedure: ESOPHAGOGASTRODUODENOSCOPY (EGD) WITH PROPOFOL;  Surgeon: Annamaria Helling, DO;  Location: [facility];  Service: Gastroenterology;  Laterality: N/A;   INCISION AND DRAINAGE OF WOUND Left 06/07/2019   Procedure: IRRIGATION AND DEBRIDEMENT WOUND;  Surgeon: Albertine Patricia, DPM;  Location: [facility];  Service: Podiatry;  Laterality: Left;   KNEE ARTHROPLASTY Right 11/13/2021   Procedure: COMPUTER ASSISTED TOTAL KNEE ARTHROPLASTY;  Surgeon: Dereck Leep, MD;  Location: [facility];  Service: Orthopedics;  Laterality: Right;   LOWER EXTREMITY ANGIOGRAPHY Left 06/03/2019   Procedure: Lower Extremity Angiography;  Surgeon: Katha Cabal,  MD;  Location: [facility];  Service: Cardiovascular;  Laterality: Left;   LOWER EXTREMITY ANGIOGRAPHY Right 05/21/2021   Procedure: LOWER EXTREMITY ANGIOGRAPHY;  Surgeon: Katha Cabal, MD;  Location: [facility];  Service: Cardiovascular;  Laterality: Right;   PERIPHERAL VASCULAR BALLOON ANGIOPLASTY Left 05/16/2019   Procedure: PERIPHERAL VASCULAR BALLOON ANGIOPLASTY;  Surgeon: Lorretta Harp, MD;  Location: [facility];  Service: Cardiovascular;  Laterality: Left;  SFA   TUBAL LIGATION  1972   VEIN LIGATION AND STRIPPING     Family History  Problem Relation Age of Onset   Arthritis Mother    Heart attack Father    Cancer Sister    Cancer - Prostate Brother    Colon cancer Neg Hx    Breast cancer Neg Hx    Social History   Socioeconomic History   Marital status: Married    Spouse name: [name]   Number of children: 2   Years of education: Not on file   Highest education level: Not on file  Occupational History   Not on file  Tobacco Use   Smoking status: Former    Packs/day: 0.50    Years: 15.00    Total pack years: 7.50    Types: Cigarettes    Quit date: 05/12/1988    Years since quitting: 33.9   Smokeless tobacco: Never  Vaping Use   Vaping Use: Never used  Substance and Sexual Activity   Alcohol use: No    Alcohol/week: 0.0 standard drinks of alcohol   Drug use: No   Sexual activity: Yes  Other Topics Concern   Not on file  Social History Narrative   Lives at home with spouse [name]    Social Determinants of Health   Financial Resource Strain: Low Risk  (11/20/2021)   Overall Financial Resource Strain (CARDIA)    Difficulty of Paying Living Expenses: Not hard at all  Food Insecurity: No Food Insecurity (11/20/2021)   Hunger Vital Sign    Worried About Running Out of Food in the Last Year: Never true    Ran Out of Food in the Last Year: Never true  Transportation Needs: No Transportation Needs (11/20/2021)   PRAPARE -  Hydrologist (Medical): No    Lack of Transportation (Non-Medical): No  Physical Activity: Sufficiently Active (11/20/2021)   Exercise Vital Sign    Days of Exercise per Week: 3 days    Minutes of Exercise per Session: 60 min  Stress: No Stress Concern Present (11/20/2021)   [place]    Feeling of Stress : Not at all  Social Connections: [place] (11/20/2021)   Social Connection and Isolation Panel [NHANES]    Frequency of Communication with  Friends and Family: More than three times a week    Frequency of Social Gatherings with Friends and Family: Not on file    Attends Religious Services: More than 4 times per year    Active Member of [employer] or Organizations: Yes    Attends Music therapist: More than 4 times per year    Marital Status: Married     Review of Systems  Constitutional:  Negative for appetite change and unexpected weight change.  HENT:  Negative for congestion and sinus pressure.   Respiratory:  Negative for cough and chest tightness.        Breathing stable.   Cardiovascular:  Negative for chest pain and palpitations.       No increased swelling. Stable.   Gastrointestinal:  Negative for abdominal pain, nausea and vomiting.       Soft stool as outlined.   Genitourinary:  Negative for difficulty urinating and dysuria.  Musculoskeletal:  Negative for myalgias.       Knee pain as outlined.   Skin:  Negative for color change and rash.  Neurological:  Negative for dizziness and headaches.  Psychiatric/Behavioral:  Negative for agitation and dysphoric mood.        Objective:     BP 134/84   Pulse 73   Temp 97.8 F (36.6 C) (Oral)   Ht 5' 6" (1.676 m)   Wt 203 lb 12.8 oz (92.4 kg)   LMP 05/25/1988   SpO2 95%   BMI 32.89 kg/m  Wt Readings from Last 3 Encounters:  04/14/22 203 lb 12.8 oz (92.4 kg)  01/30/22 206 lb (93.4 kg)  12/24/21 211 lb  12.8 oz (96.1 kg)    Physical Exam Vitals reviewed.  Constitutional:      General: She is not in acute distress.    Appearance: Normal appearance.  HENT:     Head: Normocephalic and atraumatic.     Right Ear: External ear normal.     Left Ear: External ear normal.  Eyes:     General: No scleral icterus.       Right eye: No discharge.        Left eye: No discharge.     Conjunctiva/sclera: Conjunctivae normal.  Neck:     Thyroid: No thyromegaly.  Cardiovascular:     Rate and Rhythm: Normal rate and regular rhythm.  Pulmonary:     Effort: No respiratory distress.     Breath sounds: Normal breath sounds. No wheezing.  Abdominal:     General: Bowel sounds are normal.     Palpations: Abdomen is soft.     Tenderness: There is no abdominal tenderness.  Musculoskeletal:        General: No tenderness.     Cervical back: Neck supple. No tenderness.     Comments: No increased swelling.   Lymphadenopathy:     Cervical: No cervical adenopathy.  Skin:    Findings: No erythema or rash.  Neurological:     Mental Status: She is alert.  Psychiatric:        Mood and Affect: Mood normal.        Behavior: Behavior normal.      Outpatient Encounter Medications as of 04/14/2022  Medication Sig   acetaminophen (TYLENOL) 650 MG CR tablet Take 650-1,300 mg by mouth every 8 (eight) hours as needed for pain.   allopurinol (ZYLOPRIM) 300 MG tablet Take 1 tablet by mouth once daily   aspirin 81 MG chewable tablet Chew 2  tablets (162 mg total) by mouth daily.   atorvastatin (LIPITOR) 40 MG tablet Take 1 tablet by mouth once daily   celecoxib (CELEBREX) 200 MG capsule Take 1 capsule (200 mg total) by mouth 2 (two) times daily.   clopidogrel (PLAVIX) 75 MG tablet Take 1 tablet by mouth once daily with breakfast   fexofenadine (ALLEGRA) 180 MG tablet Take 1 tablet (180 mg total) by mouth daily. (Patient taking differently: Take 180 mg by mouth daily as needed for allergies.)   folic acid (FOLVITE)  1 MG tablet Take 1 mg by mouth every evening.    glucose blood (BAYER CONTOUR TEST) test strip USE  STRIP TO CHECK GLUCOSE TWICE DAILY. Dx: E11.9   ipratropium (ATROVENT) 0.06 % nasal spray Place 2 sprays into both nostrils 4 (four) times daily. (Patient taking differently: Place 2 sprays into both nostrils 4 (four) times daily as needed for rhinitis.)   losartan (COZAAR) 100 MG tablet Take 1 tablet (100 mg total) by mouth daily.   magnesium oxide (MAG-OX) 400 (240 Mg) MG tablet Take 1 tablet (400 mg total) by mouth 2 (two) times daily. (Patient taking differently: Take 400 mg by mouth 2 (two) times daily. Pt states she takes four tablets daily)   methotrexate (RHEUMATREX) 2.5 MG tablet Take 10 mg by mouth every Wednesday.   metoprolol succinate (TOPROL-XL) 50 MG 24 hr tablet TAKE 1 TABLET BY MOUTH ONCE DAILY IMMEDIATELY  FOLLOWING  A  MEAL   MICROLET LANCETS MISC Check sugar once daily, [employer] Microlet Lancets. Dx E11.9   pantoprazole (PROTONIX) 40 MG tablet Take 1 tablet by mouth once daily   torsemide (DEMADEX) 20 MG tablet Take 20 mg by mouth as needed. Taking 1/2 tablet just as needed   traMADol (ULTRAM) 50 MG tablet Take 1 tablet (50 mg total) by mouth every 4 (four) hours as needed for moderate pain.   triamcinolone cream (KENALOG) 0.1 % Apply 1 application topically 2 (two) times daily. To lower legs before putting on and/or after removing compression hose for redness in legs   trospium (SANCTURA) 20 MG tablet Take 1 tablet (20 mg total) by mouth at bedtime.   [DISCONTINUED] metFORMIN (GLUCOPHAGE) 500 MG tablet Take 1 tablet by mouth once daily   No facility-administered encounter medications on file as of 04/14/2022.     Lab Results  Component Value Date   WBC 4.4 03/21/2022   HGB 12.1 03/21/2022   HCT 38.6 03/21/2022   PLT 119.0 (L) 03/21/2022   GLUCOSE 96 03/21/2022   CHOL 93 03/21/2022   TRIG 122.0 03/21/2022   HDL 37.10 (L) 03/21/2022   LDLCALC 32 03/21/2022   ALT 17  03/21/2022   AST 25 03/21/2022   NA 140 03/21/2022   K 4.4 03/21/2022   CL 105 03/21/2022   CREATININE 1.12 03/21/2022   BUN 21 03/21/2022   CO2 30 03/21/2022   TSH 4.73 03/21/2022   INR 1.1 03/25/2021   HGBA1C 6.1 03/21/2022   MICROALBUR 17.6 (H) 12/19/2021    MM 3D SCREEN BREAST BILATERAL  Result Date: 02/05/2022 CLINICAL DATA:  Screening. EXAM: DIGITAL SCREENING BILATERAL MAMMOGRAM WITH TOMOSYNTHESIS AND CAD TECHNIQUE: Bilateral screening digital craniocaudal and mediolateral oblique mammograms were obtained. Bilateral screening digital breast tomosynthesis was performed. The images were evaluated with computer-aided detection. COMPARISON:  Previous exam(s). ACR Breast Density Category b: There are scattered areas of fibroglandular density. FINDINGS: There are no findings suspicious for malignancy. IMPRESSION: No mammographic evidence of malignancy. A result letter of this  screening mammogram will be mailed directly to the patient. RECOMMENDATION: Screening mammogram in one year. (Code:SM-B-01Y) BI-RADS CATEGORY  1: Negative. Electronically Signed   By: Ammie Ferrier M.D.   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  Followed by [facility].       Aortic atherosclerosis ([place])    Continue lipitor.       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  Continue lipitor and plavix. Doing well.        Barrett's esophagus    Recently evaluated by GI.  Recommended continuing protonix daily. Followed by GI.       Change in bowel movement     Is having issues with her bowel that she feels is related to the increased magesium.  Discussed trial of magnesium glycinate - to see if bowels improve.  Given magnesium was in normal range last check, will try magnesium glycinate bid. Also trial of benefiber.        CKD (chronic kidney disease) stage 3, GFR 30-59 ml/min (HCC)    Avoid antiinflammatories.  Stay hydrated.  Follow metabolic panel. Continue losartan.       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       Diabetes mellitus ([place])    On metformin.  Low carb diet and exercise.  Follow met b and a1c.        Ear lesion    Bactroban.        GERD (gastroesophageal reflux disease)    No upper symptoms reported.  Continue protonix.       History of colon polyps    Colonoscopy 09/26/21.       Hypercholesterolemia    Continue lipitor.  Low cholesterol diet and exercise.  Follow lipid panel and liver function tests.        Hypertension - Primary    Continue metoprolol, losartan and amlodipine.  Follow pressures.  Follow metabolic panel.       Hypomagnesemia    Has had a problem with low magnesium requiring increased oral supplements.  Is having issues with her bowel that she feels is related to the increased magesium.  Discussed trial of magnesium glycinate - to see if bowels improve.  Given magnesium was in normal range last check, will try magnesium glycinate bid.  Follow magnesium level.       Knee pain    S/p right TKA.  Now with left knee pain.  Continue f/u with ortho.       Pulmonary hypertension, mild ([place])    Continue f/u with pulmonary.  Discussed treatment for sleep apnea.       Renal angiolipoma    Dr Bernardo Heater (01/30/22) - stable.  F/u one year.       Rheumatoid arthritis involving multiple sites with positive rheumatoid factor (HCC)    On MTX.  Followed by rheumatology.       Thrombocytopenia ([place])    Follow cbc.         Einar Pheasant, MD

## 2022-04-15 ENCOUNTER — Telehealth: Payer: Self-pay | Admitting: Internal Medicine

## 2022-04-15 NOTE — Telephone Encounter (Signed)
Patient called and said she saw Dr Nicki Reaper yesterday. Dr Nicki Reaper was suppose to call in a cream for her ear, and patient's pharmacy did not get the prescription.

## 2022-04-16 MED ORDER — MUPIROCIN 2 % EX OINT: 1.0000 | TOPICAL_OINTMENT | Freq: Two times a day (BID) | CUTANEOUS | 0 refills | Status: DC

## 2022-04-16 NOTE — Telephone Encounter (Signed)
LMTCB

## 2022-04-16 NOTE — Telephone Encounter (Signed)
Bactroban rx sent in to [facility]. Please confirm correct pharmacy. Sorry for the delay

## 2022-04-16 NOTE — Addendum Note (Signed)
Addended by: Alisa Graff on: 04/16/2022 04:20 PM   Modules accepted: Orders

## 2022-04-18 ENCOUNTER — Other Ambulatory Visit: Payer: Self-pay | Admitting: Internal Medicine

## 2022-04-20 ENCOUNTER — Encounter: Payer: Self-pay | Admitting: Internal Medicine

## 2022-04-20 DIAGNOSIS — H9390 Unspecified disorder of ear, unspecified ear: Secondary | ICD-10-CM | POA: Insufficient documentation

## 2022-04-20 DIAGNOSIS — R198 Other specified symptoms and signs involving the digestive system and abdomen: Secondary | ICD-10-CM | POA: Insufficient documentation

## 2022-04-20 NOTE — Assessment & Plan Note (Signed)
On MTX.  Followed by rheumatology.

## 2022-04-20 NOTE — Assessment & Plan Note (Signed)
Avoid antiinflammatories.  Stay hydrated.  Follow metabolic panel. Continue losartan.

## 2022-04-20 NOTE — Assessment & Plan Note (Signed)
Follow cbc.  

## 2022-04-20 NOTE — Assessment & Plan Note (Signed)
Bactroban 

## 2022-04-20 NOTE — Assessment & Plan Note (Signed)
On metformin.  Low carb diet and exercise.  Follow met b and a1c.

## 2022-04-20 NOTE — Assessment & Plan Note (Signed)
S/p angiogram, angioplasty and toe amputation.  Continue lipitor and plavix. Doing well.

## 2022-04-20 NOTE — Assessment & Plan Note (Signed)
No upper symptoms reported.  Continue protonix.  

## 2022-04-20 NOTE — Assessment & Plan Note (Addendum)
Continue f/u with pulmonary.  Discussed treatment for sleep apnea.

## 2022-04-20 NOTE — Assessment & Plan Note (Signed)
S/p right TKA.  Now with left knee pain.  Continue f/u with ortho.

## 2022-04-20 NOTE — Assessment & Plan Note (Signed)
Continue lipitor  ?

## 2022-04-20 NOTE — Assessment & Plan Note (Signed)
Is having issues with her bowel that she feels is related to the increased magesium.  Discussed trial of magnesium glycinate - to see if bowels improve.  Given magnesium was in normal range last check, will try magnesium glycinate bid. Also trial of benefiber.

## 2022-04-20 NOTE — Assessment & Plan Note (Signed)
Followed by [facility]/rheumatology.

## 2022-04-20 NOTE — Assessment & Plan Note (Signed)
Recently evaluated by GI.  Recommended continuing protonix daily. Followed by GI.

## 2022-04-20 NOTE — Assessment & Plan Note (Signed)
S/p amputation.  Followed by [facility].

## 2022-04-20 NOTE — Assessment & Plan Note (Signed)
Colonoscopy 09/26/21.

## 2022-04-20 NOTE — Assessment & Plan Note (Signed)
Continue lipitor.  Low cholesterol diet and exercise.  Follow lipid panel and liver function tests.   

## 2022-04-20 NOTE — Assessment & Plan Note (Signed)
Has had a problem with low magnesium requiring increased oral supplements.  Is having issues with her bowel that she feels is related to the increased magesium.  Discussed trial of magnesium glycinate - to see if bowels improve.  Given magnesium was in normal range last check, will try magnesium glycinate bid.  Follow magnesium level.

## 2022-04-20 NOTE — Assessment & Plan Note (Signed)
Continue metoprolol, losartan and amlodipine.  Follow pressures.  Follow metabolic panel.

## 2022-04-20 NOTE — Assessment & Plan Note (Signed)
Dr Bernardo Heater (01/30/22) - stable.  F/u one year.

## 2022-04-28 ENCOUNTER — Other Ambulatory Visit: Payer: Medicare Other

## 2022-04-28 ENCOUNTER — Telehealth: Payer: Self-pay | Admitting: Internal Medicine

## 2022-04-28 ENCOUNTER — Other Ambulatory Visit (INDEPENDENT_AMBULATORY_CARE_PROVIDER_SITE_OTHER): Payer: Medicare Other

## 2022-04-28 DIAGNOSIS — M0579 Rheumatoid arthritis with rheumatoid factor of multiple sites without organ or systems involvement: Secondary | ICD-10-CM

## 2022-04-28 LAB — CBC WITH DIFFERENTIAL/PLATELET
Basophils Absolute: 0 10*3/uL (ref 0.0–0.1)
Basophils Relative: 0.7 % (ref 0.0–3.0)
Eosinophils Absolute: 0 10*3/uL (ref 0.0–0.7)
Eosinophils Relative: 0.8 % (ref 0.0–5.0)
HCT: 37.7 % (ref 36.0–46.0)
Hemoglobin: 11.9 g/dL — ABNORMAL LOW (ref 12.0–15.0)
Lymphocytes Relative: 22.4 % (ref 12.0–46.0)
Lymphs Abs: 1.3 10*3/uL (ref 0.7–4.0)
MCHC: 31.5 g/dL (ref 30.0–36.0)
MCV: 96.6 fl (ref 78.0–100.0)
Monocytes Absolute: 0.5 10*3/uL (ref 0.1–1.0)
Monocytes Relative: 7.9 % (ref 3.0–12.0)
Neutro Abs: 3.9 10*3/uL (ref 1.4–7.7)
Neutrophils Relative %: 68.2 % (ref 43.0–77.0)
Platelets: 130 10*3/uL — ABNORMAL LOW (ref 150.0–400.0)
RBC: 3.9 Mil/uL (ref 3.87–5.11)
RDW: 20.9 % — ABNORMAL HIGH (ref 11.5–15.5)
WBC: 5.7 10*3/uL (ref 4.0–10.5)

## 2022-04-28 LAB — MAGNESIUM: Magnesium: 1.5 mg/dL (ref 1.5–2.5)

## 2022-04-28 NOTE — Telephone Encounter (Signed)
Cbc with dif ordered.  Let me know if I need to do anything more.  Thanks

## 2022-04-28 NOTE — Addendum Note (Signed)
Addended by: Alisa Graff on: 04/28/2022 12:41 PM   Modules accepted: Orders

## 2022-04-28 NOTE — Telephone Encounter (Signed)
Patient stated her doctor at [facility] wants her to have a CBCD done, the PA is Will Defoor. saw her today. Patient told him she was having labs done here today for Magnesium, and wanted to know if you can add the CBCD to it.

## 2022-04-30 ENCOUNTER — Telehealth: Payer: Self-pay

## 2022-04-30 ENCOUNTER — Other Ambulatory Visit: Payer: Self-pay | Admitting: Internal Medicine

## 2022-04-30 ENCOUNTER — Other Ambulatory Visit: Payer: Self-pay

## 2022-04-30 NOTE — Telephone Encounter (Signed)
Lm for pt to cb   Given her persistent intermittent feelings of her heart beating in her throat, needs to be evaluated.  She sees Dr Nehemiah Massed.  Can schedule f/u appt with cardiology for further evaluation.  Also, please schedule f/u magnesium lab in 10-14 days.

## 2022-04-30 NOTE — Progress Notes (Signed)
Order placed for nephrology referral.   °

## 2022-05-03 ENCOUNTER — Other Ambulatory Visit: Payer: Self-pay | Admitting: Internal Medicine

## 2022-05-13 ENCOUNTER — Other Ambulatory Visit (INDEPENDENT_AMBULATORY_CARE_PROVIDER_SITE_OTHER): Payer: Medicare Other

## 2022-05-13 LAB — MAGNESIUM: Magnesium: 1.7 mg/dL (ref 1.5–2.5)

## 2022-05-23 ENCOUNTER — Other Ambulatory Visit: Payer: Self-pay | Admitting: Internal Medicine

## 2022-06-02 LAB — OPHTHALMOLOGY REPORT-SCANNED

## 2022-06-21 ENCOUNTER — Other Ambulatory Visit: Payer: Self-pay | Admitting: Internal Medicine

## 2022-06-24 DIAGNOSIS — D631 Anemia in chronic kidney disease: Secondary | ICD-10-CM | POA: Insufficient documentation

## 2022-06-24 DIAGNOSIS — J84112 Idiopathic pulmonary fibrosis: Secondary | ICD-10-CM | POA: Insufficient documentation

## 2022-06-24 DIAGNOSIS — I509 Heart failure, unspecified: Secondary | ICD-10-CM | POA: Insufficient documentation

## 2022-06-24 DIAGNOSIS — R809 Proteinuria, unspecified: Secondary | ICD-10-CM | POA: Insufficient documentation

## 2022-07-02 ENCOUNTER — Other Ambulatory Visit: Payer: Self-pay | Admitting: Internal Medicine

## 2022-07-03 ENCOUNTER — Other Ambulatory Visit: Payer: Self-pay

## 2022-07-03 DIAGNOSIS — E7800 Pure hypercholesterolemia, unspecified: Secondary | ICD-10-CM

## 2022-07-03 DIAGNOSIS — E1159 Type 2 diabetes mellitus with other circulatory complications: Secondary | ICD-10-CM

## 2022-07-03 DIAGNOSIS — I10 Essential (primary) hypertension: Secondary | ICD-10-CM

## 2022-07-10 ENCOUNTER — Other Ambulatory Visit (INDEPENDENT_AMBULATORY_CARE_PROVIDER_SITE_OTHER): Payer: Medicare Other

## 2022-07-10 DIAGNOSIS — E1159 Type 2 diabetes mellitus with other circulatory complications: Secondary | ICD-10-CM

## 2022-07-10 DIAGNOSIS — E7800 Pure hypercholesterolemia, unspecified: Secondary | ICD-10-CM

## 2022-07-10 DIAGNOSIS — I10 Essential (primary) hypertension: Secondary | ICD-10-CM | POA: Diagnosis not present

## 2022-07-10 LAB — BASIC METABOLIC PANEL
BUN: 23 mg/dL (ref 6–23)
CO2: 30 mEq/L (ref 19–32)
Calcium: 10.7 mg/dL — ABNORMAL HIGH (ref 8.4–10.5)
Chloride: 104 mEq/L (ref 96–112)
Creatinine, Ser: 1 mg/dL (ref 0.40–1.20)
GFR: 52.97 mL/min — ABNORMAL LOW (ref >60.00)
Glucose, Bld: 89 mg/dL (ref 70–99)
Potassium: 4.4 mEq/L (ref 3.5–5.1)
Sodium: 141 mEq/L (ref 135–145)

## 2022-07-10 LAB — HEPATIC FUNCTION PANEL
ALT: 10 U/L (ref 0–35)
AST: 18 U/L (ref 0–37)
Albumin: 3.6 g/dL (ref 3.5–5.2)
Alkaline Phosphatase: 77 U/L (ref 39–117)
Bilirubin, Direct: 0.2 mg/dL (ref 0.0–0.3)
Total Bilirubin: 0.6 mg/dL (ref 0.2–1.2)
Total Protein: 6.9 g/dL (ref 6.0–8.3)

## 2022-07-10 LAB — HEMOGLOBIN A1C: Hgb A1c MFr Bld: 6 % (ref 4.6–6.5)

## 2022-07-10 LAB — LIPID PANEL
Cholesterol: 99 mg/dL (ref 0–200)
HDL: 36.6 mg/dL — ABNORMAL LOW (ref >39.00)
LDL Cholesterol: 39 mg/dL (ref 0–99)
NonHDL: 62.12
Total CHOL/HDL Ratio: 3
Triglycerides: 118 mg/dL (ref 0.0–149.0)
VLDL: 23.6 mg/dL (ref 0.0–40.0)

## 2022-07-10 LAB — MAGNESIUM: Magnesium: 1.6 mg/dL (ref 1.5–2.5)

## 2022-07-14 ENCOUNTER — Encounter: Payer: Self-pay | Admitting: Internal Medicine

## 2022-07-14 ENCOUNTER — Ambulatory Visit (INDEPENDENT_AMBULATORY_CARE_PROVIDER_SITE_OTHER): Payer: Medicare Other | Admitting: Internal Medicine

## 2022-07-14 VITALS — BP 128/78 | HR 74 | Temp 97.9°F | Resp 16 | Ht 66.0 in | Wt 201.8 lb

## 2022-07-14 DIAGNOSIS — Z860100 Personal history of colon polyps, unspecified: Secondary | ICD-10-CM

## 2022-07-14 DIAGNOSIS — E1159 Type 2 diabetes mellitus with other circulatory complications: Secondary | ICD-10-CM | POA: Diagnosis not present

## 2022-07-14 DIAGNOSIS — Z89422 Acquired absence of other left toe(s): Secondary | ICD-10-CM

## 2022-07-14 DIAGNOSIS — E7800 Pure hypercholesterolemia, unspecified: Secondary | ICD-10-CM

## 2022-07-14 DIAGNOSIS — K2100 Gastro-esophageal reflux disease with esophagitis, without bleeding: Secondary | ICD-10-CM

## 2022-07-14 DIAGNOSIS — I700 Atherosclerosis of aorta: Secondary | ICD-10-CM

## 2022-07-14 DIAGNOSIS — M25569 Pain in unspecified knee: Secondary | ICD-10-CM

## 2022-07-14 DIAGNOSIS — K2270 Barrett's esophagus without dysplasia: Secondary | ICD-10-CM

## 2022-07-14 DIAGNOSIS — D1771 Benign lipomatous neoplasm of kidney: Secondary | ICD-10-CM

## 2022-07-14 DIAGNOSIS — M341 CR(E)ST syndrome: Secondary | ICD-10-CM

## 2022-07-14 DIAGNOSIS — D696 Thrombocytopenia, unspecified: Secondary | ICD-10-CM

## 2022-07-14 DIAGNOSIS — I70221 Atherosclerosis of native arteries of extremities with rest pain, right leg: Secondary | ICD-10-CM

## 2022-07-14 DIAGNOSIS — I10 Essential (primary) hypertension: Secondary | ICD-10-CM | POA: Diagnosis not present

## 2022-07-14 DIAGNOSIS — I272 Pulmonary hypertension, unspecified: Secondary | ICD-10-CM

## 2022-07-14 DIAGNOSIS — M0579 Rheumatoid arthritis with rheumatoid factor of multiple sites without organ or systems involvement: Secondary | ICD-10-CM

## 2022-07-14 DIAGNOSIS — N1831 Chronic kidney disease, stage 3a: Secondary | ICD-10-CM

## 2022-07-14 DIAGNOSIS — G8929 Other chronic pain: Secondary | ICD-10-CM

## 2022-07-14 NOTE — Progress Notes (Signed)
Subjective:    Patient ID: Nina Perez, female    DOB: 10/04/1940, 82 y.o.   MRN: [id number]  Patient here for  Chief Complaint  Patient presents with   Medical Management of Chronic Issues    HPI Here to follow up regarding her blood pressure, blood sugar and cholesterol. She is accompanied by her husband. History obtained from both of them. She is s/p right TKR 11/13/21. Right knee doing ok. Her left knee has been bothering her more after therapy.  States feels like it is going to give away at times.  Followed by Dr Marry Guan.  Instructed her to exercise more.  No chest pain or sob reported.  No increased cough or congestion.  No abdominal pain.  Bowels moving.  Planning for right eye surgery this week. Recent knee surgery as outlined.  No problems with the surgery.     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[place])    a.) 2/2 scleroderma Dx   LBBB (left bundle branch block)    Long term current use of antithrombotics/antiplatelets    a.) ASA + clopidogrel   Long term current use of immunosuppressive drug    a.) MTX for RA and scleroderma Dx   OSA on CPAP    Osteoarthritis    a.) knees, spine   PAD (peripheral artery disease) (HCC)    Phlebitis    a.) x 2 (with pregnancy)   Pulmonary fibrosis ([place])    a.) mild   Pulmonary hypertension ([place]) 02/24/2012   a.) TTE 02/24/2012: EF 55%, RVSP 33; b.) TTE 10/12/2014: EF >55%, RVSP 36.1; c.) TTE 09/02/2017: EF 55%, RVSP 45.5   Reflux esophagitis    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 b.) renal US 05/23/2020: solid LEFT renal mass measuring 1.4 x 1.5 x 1.6. 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, sclerodactyly, telangiectasias   Skin cancer 2018   Supplemental oxygen dependent    a.) 2L/ PRN for DOE related to mild pulmonary fibrosis and pulmonary HTN   T2DM (type 2 diabetes mellitus) ([place])    Past Surgical History:  Procedure Laterality Date   ABDOMINAL AORTOGRAM W/LOWER EXTREMITY Left 05/16/2019   Procedure: ABDOMINAL AORTOGRAM W/LOWER EXTREMITY;  Surgeon: Lorretta Harp, MD;  Location: [facility];  Service: Cardiovascular;  Laterality: Left;   AMPUTATION TOE Left 06/02/2019   Procedure: AMPUTATION TOE;  Surgeon: Albertine Patricia, DPM;  Location: [facility];  Service: Podiatry;  Laterality: Left;   APPENDECTOMY  1972   BREAST CYST ASPIRATION Left    neg   COLONOSCOPY WITH PROPOFOL N/A 09/26/2021   Procedure: COLONOSCOPY WITH PROPOFOL;  Surgeon: Annamaria Helling, DO;  Location: [facility];  Service: Gastroenterology;  Laterality:  N/A;  DM, WHEELCHAIR, PLAVIX   ESOPHAGOGASTRODUODENOSCOPY (EGD) WITH PROPOFOL N/A 09/26/2021   Procedure: ESOPHAGOGASTRODUODENOSCOPY (EGD) WITH PROPOFOL;  Surgeon: Annamaria Helling, DO;  Location: [facility];  Service: Gastroenterology;  Laterality: N/A;   INCISION AND DRAINAGE OF WOUND Left 06/07/2019   Procedure: IRRIGATION AND DEBRIDEMENT WOUND;  Surgeon: Albertine Patricia, DPM;  Location: [facility];  Service: Podiatry;  Laterality: Left;   KNEE ARTHROPLASTY Right 11/13/2021   Procedure: COMPUTER ASSISTED TOTAL KNEE ARTHROPLASTY;  Surgeon: Dereck Leep, MD;  Location: [facility];  Service: Orthopedics;  Laterality: Right;   LOWER EXTREMITY ANGIOGRAPHY Left 06/03/2019   Procedure: Lower Extremity  Angiography;  Surgeon: Katha Cabal, MD;  Location: [facility];  Service: Cardiovascular;  Laterality: Left;   LOWER EXTREMITY ANGIOGRAPHY Right 05/21/2021   Procedure: LOWER EXTREMITY ANGIOGRAPHY;  Surgeon: Katha Cabal, MD;  Location: [facility];  Service: Cardiovascular;  Laterality: Right;   PERIPHERAL VASCULAR BALLOON ANGIOPLASTY Left 05/16/2019   Procedure: PERIPHERAL VASCULAR BALLOON ANGIOPLASTY;  Surgeon: Lorretta Harp, MD;  Location: [facility];  Service: Cardiovascular;  Laterality: Left;  SFA   TUBAL LIGATION  1972   VEIN LIGATION AND STRIPPING     Family History  Problem Relation Age of Onset   Arthritis Mother    Heart attack Father    Cancer Sister    Cancer - Prostate Brother    Colon cancer Neg Hx    Breast cancer Neg Hx    Social History   Socioeconomic History   Marital status: Married    Spouse name: [name]   Number of children: 2   Years of education: Not on file   Highest education level: Not on file  Occupational History   Not on file  Tobacco Use   Smoking status: Former    Packs/day: 0.50    Years: 15.00    Total pack years: 7.50    Types: Cigarettes    Quit date: 05/12/1988    Years since quitting: 34.2   Smokeless tobacco: Never  Vaping Use   Vaping Use: Never used  Substance and Sexual Activity   Alcohol use: No    Alcohol/week: 0.0 standard drinks of alcohol   Drug use: No   Sexual activity: Yes  Other Topics Concern   Not on file  Social History Narrative   Lives at home with spouse [name]    Social Determinants of Health   Financial Resource Strain: Low Risk  (11/20/2021)   Overall Financial Resource Strain (CARDIA)    Difficulty of Paying Living Expenses: Not hard at all  Food Insecurity: No Food Insecurity (11/20/2021)   Hunger Vital Sign    Worried About Running Out of Food in the Last Year: Never true    Ran Out of Food in the Last Year: Never true  Transportation Needs: No Transportation  Needs (11/20/2021)   PRAPARE - Hydrologist (Medical): No    Lack of Transportation (Non-Medical): No  Physical Activity: Sufficiently Active (11/20/2021)   Exercise Vital Sign    Days of Exercise per Week: 3 days    Minutes of Exercise per Session: 60 min  Stress: No Stress Concern Present (11/20/2021)   [place]    Feeling of Stress : Not at all  Social Connections: [place] (11/20/2021)   Social Connection and Isolation Panel [NHANES]    Frequency of Communication with Friends and Family: More  than three times a week    Frequency of Social Gatherings with Friends and Family: Not on file    Attends Religious Services: More than 4 times per year    Active Member of [employer] or Organizations: Yes    Attends Music therapist: More than 4 times per year    Marital Status: Married     Review of Systems  Constitutional:  Negative for appetite change and unexpected weight change.  HENT:  Negative for congestion and sinus pressure.   Respiratory:  Negative for cough, chest tightness and shortness of breath.   Cardiovascular:  Negative for chest pain and palpitations.  Gastrointestinal:  Negative for abdominal pain, diarrhea, nausea and vomiting.  Genitourinary:  Negative for difficulty urinating and dysuria.  Musculoskeletal:  Negative for myalgias.       Knee pain as outlined.   Skin:  Negative for color change and rash.  Neurological:  Negative for dizziness and headaches.  Psychiatric/Behavioral:  Negative for agitation and dysphoric mood.        Objective:     BP 128/78   Pulse 74   Temp 97.9 F (36.6 C)   Resp 16   Ht '5\' 6"'$  (1.676 m)   Wt 201 lb 12.8 oz (91.5 kg)   LMP 05/25/1988   SpO2 97%   BMI 32.57 kg/m  Wt Readings from Last 3 Encounters:  07/14/22 201 lb 12.8 oz (91.5 kg)  04/14/22 203 lb 12.8 oz (92.4 kg)  01/30/22 206 lb (93.4 kg)    Physical  Exam Vitals reviewed.  Constitutional:      General: She is not in acute distress.    Appearance: Normal appearance.  HENT:     Head: Normocephalic and atraumatic.     Right Ear: External ear normal.     Left Ear: External ear normal.  Eyes:     General: No scleral icterus.       Right eye: No discharge.        Left eye: No discharge.     Conjunctiva/sclera: Conjunctivae normal.  Neck:     Thyroid: No thyromegaly.  Cardiovascular:     Rate and Rhythm: Normal rate and regular rhythm.  Pulmonary:     [place]: No respiratory distress.     Breath sounds: Normal breath sounds. No wheezing.  Abdominal:     General: Bowel sounds are normal.     Palpations: Abdomen is soft.     Tenderness: There is no abdominal tenderness.  Musculoskeletal:        General: No swelling or tenderness.     Cervical back: Neck supple. No tenderness.  Lymphadenopathy:     Cervical: No cervical adenopathy.  Skin:    Findings: No erythema or rash.  Neurological:     Mental Status: She is alert.  Psychiatric:        Mood and Affect: Mood normal.        Behavior: Behavior normal.      Outpatient Encounter Medications as of 07/14/2022  Medication Sig   allopurinol (ZYLOPRIM) 300 MG tablet Take 150 mg by mouth daily.   acetaminophen (TYLENOL) 650 MG CR tablet Take 650-1,300 mg by mouth every 8 (eight) hours as needed for pain.   aspirin 81 MG chewable tablet Chew 2 tablets (162 mg total) by mouth daily.   atorvastatin (LIPITOR) 40 MG tablet Take 1 tablet by mouth once daily   celecoxib (CELEBREX) 200 MG capsule Take 1 capsule (200 mg total) by mouth  2 (two) times daily.   clopidogrel (PLAVIX) 75 MG tablet Take 1 tablet by mouth once daily with breakfast   fexofenadine (ALLEGRA) 180 MG tablet Take 1 tablet (180 mg total) by mouth daily. (Patient taking differently: Take 180 mg by mouth daily as needed for allergies.)   folic acid (FOLVITE) 1 MG tablet Take 1 mg by mouth every evening.    glucose blood  (BAYER CONTOUR TEST) test strip USE  STRIP TO CHECK GLUCOSE TWICE DAILY. Dx: E11.9   ipratropium (ATROVENT) 0.06 % nasal spray Place 2 sprays into both nostrils 4 (four) times daily. (Patient taking differently: Place 2 sprays into both nostrils 4 (four) times daily as needed for rhinitis.)   losartan (COZAAR) 100 MG tablet Take 1 tablet by mouth once daily   magnesium oxide (MAG-OX) 400 (240 Mg) MG tablet Take 1 tablet (400 mg total) by mouth 2 (two) times daily. (Patient taking differently: Take 400 mg by mouth 2 (two) times daily. Pt states she takes four tablets daily)   metFORMIN (GLUCOPHAGE) 500 MG tablet Take 1 tablet by mouth once daily   methotrexate (RHEUMATREX) 2.5 MG tablet Take 10 mg by mouth every Wednesday.   metoprolol succinate (TOPROL-XL) 50 MG 24 hr tablet TAKE 1 TABLET BY MOUTH ONCE DAILY IMMEDIATELY FOLLOWING A MEAL   MICROLET LANCETS MISC Check sugar once daily, [employer] Microlet Lancets. Dx E11.9   mupirocin ointment (BACTROBAN) 2 % Apply 1 Application topically 2 (two) times daily.   pantoprazole (PROTONIX) 40 MG tablet Take 1 tablet by mouth once daily   torsemide (DEMADEX) 20 MG tablet Take 20 mg by mouth as needed. Taking 1/2 tablet just as needed   traMADol (ULTRAM) 50 MG tablet Take 1 tablet (50 mg total) by mouth every 4 (four) hours as needed for moderate pain.   triamcinolone cream (KENALOG) 0.1 % Apply 1 application topically 2 (two) times daily. To lower legs before putting on and/or after removing compression hose for redness in legs   trospium (SANCTURA) 20 MG tablet Take 1 tablet (20 mg total) by mouth at bedtime.   [DISCONTINUED] allopurinol (ZYLOPRIM) 300 MG tablet Take 1 tablet by mouth once daily   No facility-administered encounter medications on file as of 07/14/2022.     Lab Results  Component Value Date   WBC 5.7 04/28/2022   HGB 11.9 (L) 04/28/2022   HCT 37.7 04/28/2022   PLT 130.0 (L) 04/28/2022   GLUCOSE 89 07/10/2022   CHOL 99 07/10/2022    TRIG 118.0 07/10/2022   HDL 36.60 (L) 07/10/2022   LDLCALC 39 07/10/2022   ALT 10 07/10/2022   AST 18 07/10/2022   NA 141 07/10/2022   K 4.4 07/10/2022   CL 104 07/10/2022   CREATININE 1.00 07/10/2022   BUN 23 07/10/2022   CO2 30 07/10/2022   TSH 4.73 03/21/2022   INR 1.1 03/25/2021   HGBA1C 6.0 07/10/2022   MICROALBUR 17.6 (H) 12/19/2021    MM 3D SCREEN BREAST BILATERAL  Result Date: 02/05/2022 CLINICAL DATA:  Screening. EXAM: DIGITAL SCREENING BILATERAL MAMMOGRAM WITH TOMOSYNTHESIS AND CAD TECHNIQUE: Bilateral screening digital craniocaudal and mediolateral oblique mammograms were obtained. Bilateral screening digital breast tomosynthesis was performed. The images were evaluated with computer-aided detection. COMPARISON:  Previous exam(s). ACR Breast Density Category b: There are scattered areas of fibroglandular density. FINDINGS: There are no findings suspicious for malignancy. IMPRESSION: No mammographic evidence of malignancy. A result letter of this screening mammogram will be mailed directly to the patient. RECOMMENDATION: Screening  mammogram in one year. (Code:[id number]-B-01Y) BI-RADS CATEGORY  1: Negative. Electronically Signed   By: Ammie Ferrier M.D.   On: 02/05/2022 12:37       Assessment & Plan:  Type 2 diabetes mellitus with other circulatory complication, without long-term current use of insulin ([place]) Assessment & Plan: On metformin.  Low carb diet and exercise.  Follow met b and a1c.    Orders: -     Hemoglobin A1c; Future  Hypercholesterolemia Assessment & Plan: Continue lipitor.  Low cholesterol diet and exercise.  Follow lipid panel and liver function tests.    Orders: -     Lipid panel; Future -     Hepatic function panel; Future  Primary hypertension Assessment & Plan: Continue metoprolol, losartan and amlodipine.  Follow pressures.  Follow metabolic panel.   Orders: -     Basic metabolic panel; Future  Acquired absence of other left toe(s)  [facility]) Assessment & Plan: S/p amputation.  Followed by [facility].    Aortic atherosclerosis ([place]) Assessment & Plan: Continue lipitor.    Atherosclerosis of native artery of right lower extremity with rest pain [facility]) Assessment & Plan: S/p angiogram, angioplasty and toe amputation.  Continue lipitor and plavix. Doing well.     Barrett's esophagus without dysplasia Assessment & Plan: Followed by GI.  Recommended continuing protonix daily.    Stage 3a chronic kidney disease (HCC) Assessment & Plan: Avoid antiinflammatories.  Stay hydrated.  Follow metabolic panel. Continue losartan.    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    Gastroesophageal reflux disease with esophagitis without hemorrhage Assessment & Plan: No upper symptoms reported.  Continue protonix.    History of colon polyps Assessment & Plan: Colonoscopy 09/26/21.    Chronic knee pain, unspecified laterality Assessment & Plan: S/p right TKA.  Now with left knee pain.  Continue f/u with ortho.    Pulmonary hypertension, mild (HCC) Assessment & Plan: Continue f/u with pulmonary.  Discussed treatment for sleep apnea.    Renal angiolipoma Assessment & Plan: Dr Bernardo Heater (01/30/22) - stable.  F/u one year.    Rheumatoid arthritis involving multiple sites with positive rheumatoid factor (HCC) Assessment & Plan: On MTX.  Followed by rheumatology.    Thrombocytopenia ([place]) Assessment & Plan: Follow cbc.       Einar Pheasant, MD

## 2022-07-19 ENCOUNTER — Encounter: Payer: Self-pay | Admitting: Internal Medicine

## 2022-07-19 DIAGNOSIS — I70249 Atherosclerosis of native arteries of left leg with ulceration of unspecified site: Secondary | ICD-10-CM | POA: Insufficient documentation

## 2022-07-19 NOTE — Assessment & Plan Note (Signed)
Dr Stoioff (01/30/22) - stable.  F/u one year.  

## 2022-07-19 NOTE — Assessment & Plan Note (Signed)
Followed by GI.  Recommended continuing protonix daily.

## 2022-07-19 NOTE — Assessment & Plan Note (Signed)
Colonoscopy 09/26/21.  

## 2022-07-19 NOTE — Assessment & Plan Note (Signed)
Follow cbc.  

## 2022-07-19 NOTE — Assessment & Plan Note (Signed)
No upper symptoms reported.  Continue protonix.  

## 2022-07-19 NOTE — Assessment & Plan Note (Signed)
S/p right TKA.  Now with left knee pain.  Continue f/u with ortho.  

## 2022-07-19 NOTE — Assessment & Plan Note (Signed)
Continue risk factor modification 

## 2022-07-19 NOTE — Assessment & Plan Note (Signed)
Followed by [facility]/rheumatology.  

## 2022-07-19 NOTE — Assessment & Plan Note (Signed)
Continue f/u with pulmonary.  Discussed treatment for sleep apnea.  

## 2022-07-19 NOTE — Assessment & Plan Note (Signed)
On MTX.  Followed by rheumatology.  

## 2022-07-19 NOTE — Assessment & Plan Note (Signed)
S/p amputation.  Followed by [facility].  ?

## 2022-07-19 NOTE — Assessment & Plan Note (Signed)
Continue lipitor.  Low cholesterol diet and exercise.  Follow lipid panel and liver function tests.   

## 2022-07-19 NOTE — Assessment & Plan Note (Signed)
S/p angiogram, angioplasty and toe amputation.  Continue lipitor and plavix. Doing well.   

## 2022-07-19 NOTE — Assessment & Plan Note (Signed)
Continue lipitor  ?

## 2022-07-19 NOTE — Assessment & Plan Note (Signed)
Avoid antiinflammatories.  Stay hydrated.  Follow metabolic panel.  Continue losartan.  

## 2022-07-19 NOTE — Assessment & Plan Note (Signed)
Continue metoprolol, losartan and amlodipine.  Follow pressures.  Follow metabolic panel.  

## 2022-07-19 NOTE — Assessment & Plan Note (Signed)
On metformin.  Low carb diet and exercise.  Follow met b and a1c.  

## 2022-08-01 ENCOUNTER — Other Ambulatory Visit: Payer: Self-pay | Admitting: Family

## 2022-08-05 NOTE — Telephone Encounter (Signed)
Prescription Request  08/05/2022  LOV: Visit date not found  What is the name of the medication or equipment? losartan (COZAAR) 100 MG tablet  Have you contacted your pharmacy to request a refill? Yes   Which pharmacy would you like this sent to?   [place], [place] [address] [place] [address] Phone: [phone] Fax: [phone]      Patient notified that their request is being sent to the clinical staff for review and that they should receive a response within 2 business days.   Please advise at [facility] [phone]   As per pt, she only have 2 pills left.

## 2022-09-25 ENCOUNTER — Other Ambulatory Visit: Payer: Self-pay | Admitting: Internal Medicine

## 2022-10-02 ENCOUNTER — Other Ambulatory Visit: Payer: Self-pay | Admitting: Internal Medicine

## 2022-11-01 ENCOUNTER — Telehealth: Payer: Self-pay | Admitting: Internal Medicine

## 2022-11-03 NOTE — Telephone Encounter (Signed)
Prescription Request  11/03/2022  LOV: 07/14/2022  What is the name of the medication or equipment? losartan  Have you contacted your pharmacy to request a refill? yes   Which pharmacy would you like this sent to?  [facility]  Patient notified that their request is being sent to the clinical staff for review and that they should receive a response within 2 business days.   Please advise at [facility] [phone]

## 2022-11-10 ENCOUNTER — Other Ambulatory Visit (INDEPENDENT_AMBULATORY_CARE_PROVIDER_SITE_OTHER): Payer: Medicare Other

## 2022-11-10 DIAGNOSIS — I10 Essential (primary) hypertension: Secondary | ICD-10-CM | POA: Diagnosis not present

## 2022-11-10 DIAGNOSIS — E7800 Pure hypercholesterolemia, unspecified: Secondary | ICD-10-CM | POA: Diagnosis not present

## 2022-11-10 DIAGNOSIS — E1159 Type 2 diabetes mellitus with other circulatory complications: Secondary | ICD-10-CM | POA: Diagnosis not present

## 2022-11-10 LAB — LIPID PANEL
Cholesterol: 89 mg/dL (ref 0–200)
HDL: 36.9 mg/dL — ABNORMAL LOW (ref >39.00)
LDL Cholesterol: 33 mg/dL (ref 0–99)
NonHDL: 51.9
Total CHOL/HDL Ratio: 2
Triglycerides: 94 mg/dL (ref 0.0–149.0)
VLDL: 18.8 mg/dL (ref 0.0–40.0)

## 2022-11-10 LAB — BASIC METABOLIC PANEL
BUN: 19 mg/dL (ref 6–23)
CO2: 29 mEq/L (ref 19–32)
Calcium: 10.1 mg/dL (ref 8.4–10.5)
Chloride: 104 mEq/L (ref 96–112)
Creatinine, Ser: 0.92 mg/dL (ref 0.40–1.20)
GFR: 58.41 mL/min — ABNORMAL LOW (ref >60.00)
Glucose, Bld: 88 mg/dL (ref 70–99)
Potassium: 4.2 mEq/L (ref 3.5–5.1)
Sodium: 138 mEq/L (ref 135–145)

## 2022-11-10 LAB — HEPATIC FUNCTION PANEL
ALT: 19 U/L (ref 0–35)
AST: 27 U/L (ref 0–37)
Albumin: 3.5 g/dL (ref 3.5–5.2)
Alkaline Phosphatase: 78 U/L (ref 39–117)
Bilirubin, Direct: 0.2 mg/dL (ref 0.0–0.3)
Total Bilirubin: 0.8 mg/dL (ref 0.2–1.2)
Total Protein: 6.7 g/dL (ref 6.0–8.3)

## 2022-11-10 LAB — HEMOGLOBIN A1C: Hgb A1c MFr Bld: 5.8 % (ref 4.6–6.5)

## 2022-11-14 ENCOUNTER — Encounter: Payer: Self-pay | Admitting: Internal Medicine

## 2022-11-14 ENCOUNTER — Ambulatory Visit (INDEPENDENT_AMBULATORY_CARE_PROVIDER_SITE_OTHER): Payer: Medicare Other | Admitting: Internal Medicine

## 2022-11-14 VITALS — BP 136/80 | HR 79 | Temp 98.0°F | Resp 16 | Ht 66.0 in | Wt 210.2 lb

## 2022-11-14 DIAGNOSIS — I70221 Atherosclerosis of native arteries of extremities with rest pain, right leg: Secondary | ICD-10-CM | POA: Diagnosis not present

## 2022-11-14 DIAGNOSIS — E1159 Type 2 diabetes mellitus with other circulatory complications: Secondary | ICD-10-CM

## 2022-11-14 DIAGNOSIS — M0579 Rheumatoid arthritis with rheumatoid factor of multiple sites without organ or systems involvement: Secondary | ICD-10-CM

## 2022-11-14 DIAGNOSIS — D696 Thrombocytopenia, unspecified: Secondary | ICD-10-CM

## 2022-11-14 DIAGNOSIS — M1A9XX0 Chronic gout, unspecified, without tophus (tophi): Secondary | ICD-10-CM

## 2022-11-14 DIAGNOSIS — Z860100 Personal history of colon polyps, unspecified: Secondary | ICD-10-CM

## 2022-11-14 DIAGNOSIS — R011 Cardiac murmur, unspecified: Secondary | ICD-10-CM

## 2022-11-14 DIAGNOSIS — R2689 Other abnormalities of gait and mobility: Secondary | ICD-10-CM

## 2022-11-14 DIAGNOSIS — M341 CR(E)ST syndrome: Secondary | ICD-10-CM

## 2022-11-14 DIAGNOSIS — N1831 Chronic kidney disease, stage 3a: Secondary | ICD-10-CM | POA: Diagnosis not present

## 2022-11-14 DIAGNOSIS — E7800 Pure hypercholesterolemia, unspecified: Secondary | ICD-10-CM

## 2022-11-14 DIAGNOSIS — K2100 Gastro-esophageal reflux disease with esophagitis, without bleeding: Secondary | ICD-10-CM

## 2022-11-14 DIAGNOSIS — I700 Atherosclerosis of aorta: Secondary | ICD-10-CM

## 2022-11-14 DIAGNOSIS — I272 Pulmonary hypertension, unspecified: Secondary | ICD-10-CM

## 2022-11-14 DIAGNOSIS — Z89422 Acquired absence of other left toe(s): Secondary | ICD-10-CM

## 2022-11-14 DIAGNOSIS — I429 Cardiomyopathy, unspecified: Secondary | ICD-10-CM

## 2022-11-14 DIAGNOSIS — D1771 Benign lipomatous neoplasm of kidney: Secondary | ICD-10-CM

## 2022-11-14 DIAGNOSIS — I10 Essential (primary) hypertension: Secondary | ICD-10-CM

## 2022-11-14 MED ORDER — ALLOPURINOL 100 MG PO TABS
100.0000 mg | ORAL_TABLET | Freq: Every day | ORAL | 1 refills | Status: DC
Start: 2022-11-14 — End: 2023-06-02

## 2022-11-14 MED ORDER — METOPROLOL SUCCINATE ER 50 MG PO TB24: 50.0000 mg | ORAL_TABLET | Freq: Every day | ORAL | 0 refills | Status: DC

## 2022-11-14 MED ORDER — LOSARTAN POTASSIUM 100 MG PO TABS: 100.0000 mg | ORAL_TABLET | Freq: Every day | ORAL | 0 refills | Status: DC

## 2022-11-14 NOTE — Progress Notes (Unsigned)
Subjective:    Patient ID: Nina Perez, female    DOB: 12-02-1940, 82 y.o.   MRN: [id number]  Patient here for  Chief Complaint  Patient presents with   Medical Management of Chronic Issues    HPI Here to follow up regarding her blood pressure, blood sugar and cholesterol. She is accompanied by her husband. History obtained from both of them. She is s/p right TKR 11/13/21. Right knee doing ok. 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  On MTX and allopurinol. Has had left shoulder pain.  Referred to Dr Ernest Pine.    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HCC)    a.) 2/2 scleroderma Dx   LBBB (left bundle branch block)    Long term current use of antithrombotics/antiplatelets    a.) ASA + clopidogrel   Long term current use of immunosuppressive drug    a.) MTX for RA and scleroderma Dx   OSA on CPAP    Osteoarthritis    a.) knees, spine   PAD (peripheral artery disease) (HCC)    Phlebitis    a.) x 2 (with pregnancy)   Pulmonary fibrosis (HCC)    a.) mild   Pulmonary hypertension (HCC) 02/24/2012   a.) TTE 02/24/2012: EF 55%, RVSP 33; b.) TTE 10/12/2014: EF >55%, RVSP 36.1; c.) TTE 09/02/2017: EF 55%, RVSP 45.5   Reflux esophagitis    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 b.) renal US 05/23/2020: solid LEFT renal mass measuring 1.4 x 1.5 x 1.6. 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, sclerodactyly, telangiectasias   Skin cancer 2018   Supplemental oxygen dependent    a.) 2L/[place] PRN for DOE related to mild pulmonary fibrosis and pulmonary HTN   T2DM (type 2 diabetes mellitus) (HCC)    Past Surgical History:  Procedure Laterality Date   ABDOMINAL AORTOGRAM W/LOWER EXTREMITY Left 05/16/2019   Procedure: ABDOMINAL AORTOGRAM W/LOWER EXTREMITY;  Surgeon: Runell Gess, MD;  Location: [facility];  Service: Cardiovascular;  Laterality: Left;   AMPUTATION TOE Left 06/02/2019   Procedure: AMPUTATION TOE;  Surgeon: Recardo Evangelist, DPM;  Location: [facility];  Service: Podiatry;  Laterality: Left;   APPENDECTOMY  1972   BREAST CYST ASPIRATION Left    neg   COLONOSCOPY WITH PROPOFOL N/A 09/26/2021   Procedure: COLONOSCOPY WITH PROPOFOL;  Surgeon: Jaynie Collins, DO;  Location: [facility];  Service: Gastroenterology;  Laterality: N/A;  DM, WHEELCHAIR, PLAVIX   ESOPHAGOGASTRODUODENOSCOPY (EGD) WITH PROPOFOL N/A 09/26/2021   Procedure: ESOPHAGOGASTRODUODENOSCOPY (EGD) WITH PROPOFOL;  Surgeon: Jaynie Collins, DO;  Location: [facility] END[facility]COPY;  Service: Gastroenterology;  Laterality: N/A;   INCISION AND DRAINAGE OF WOUND Left 06/07/2019  Procedure: IRRIGATION AND DEBRIDEMENT WOUND;  Surgeon: Recardo Evangelist, DPM;  Location: [facility];  Service: Podiatry;  Laterality: Left;   KNEE ARTHROPLASTY Right 11/13/2021   Procedure: COMPUTER ASSISTED TOTAL KNEE ARTHROPLASTY;  Surgeon: Donato Heinz, MD;  Location: [facility];  Service: Orthopedics;  Laterality: Right;   LOWER EXTREMITY ANGIOGRAPHY Left 06/03/2019   Procedure: Lower Extremity Angiography;  Surgeon: Renford Dills, MD;  Location: [facility];  Service: Cardiovascular;  Laterality: Left;   LOWER EXTREMITY ANGIOGRAPHY Right 05/21/2021   Procedure: LOWER EXTREMITY ANGIOGRAPHY;  Surgeon:  Renford Dills, MD;  Location: [facility];  Service: Cardiovascular;  Laterality: Right;   PERIPHERAL VASCULAR BALLOON ANGIOPLASTY Left 05/16/2019   Procedure: PERIPHERAL VASCULAR BALLOON ANGIOPLASTY;  Surgeon: Runell Gess, MD;  Location: [facility];  Service: Cardiovascular;  Laterality: Left;  SFA   TUBAL LIGATION  1972   VEIN LIGATION AND STRIPPING     Family History  Problem Relation Age of Onset   Arthritis Mother    Heart attack Father    Cancer Sister    Cancer - Prostate Brother    Colon cancer Neg Hx    Breast cancer Neg Hx    Social History   Socioeconomic History   Marital status: Married    Spouse name: [name]   Number of children: 2   Years of education: Not on file   Highest education level: Not on file  Occupational History   Not on file  Tobacco Use   Smoking status: Former    Packs/day: 0.50    Years: 15.00    Additional pack years: 0.00    Total pack years: 7.50    Types: Cigarettes    Quit date: 05/12/1988    Years since quitting: 34.5   Smokeless tobacco: Never  Vaping Use   Vaping Use: Never used  Substance and Sexual Activity   Alcohol use: No    Alcohol/week: 0.0 standard drinks of alcohol   Drug use: No   Sexual activity: Yes  Other Topics Concern   Not on file  Social History Narrative   Lives at home with spouse [name]    Social Determinants of Health   Financial Resource Strain: Low Risk  (11/20/2021)   Overall Financial Resource Strain (CARDIA)    Difficulty of Paying Living Expenses: Not hard at all  Food Insecurity: No Food Insecurity (11/20/2021)   Hunger Vital Sign    Worried About Running Out of Food in the Last Year: Never true    Ran Out of Food in the Last Year: Never true  Transportation Needs: No Transportation Needs (11/20/2021)   PRAPARE - Administrator, Civil Service (Medical): No    Lack of Transportation (Non-Medical): No  Physical Activity: Sufficiently Active (11/20/2021)    Exercise Vital Sign    Days of Exercise per Week: 3 days    Minutes of Exercise per Session: 60 min  Stress: No Stress Concern Present (11/20/2021)   [employer] of Occupational Health - Occupational Stress Questionnaire    Feeling of Stress : Not at all  Social Connections: Socially Integrated (11/20/2021)   Social Connection and Isolation Panel [NHANES]    Frequency of Communication with Friends and Family: More than three times a week    Frequency of Social Gatherings with Friends and Family: Not on file    Attends Religious Services: More than 4 times per year    Active Member of [employer] or Organizations:  Yes    Attends Banker Meetings: More than 4 times per year    Marital Status: Married     Review of Systems     Objective:     BP 136/80   Pulse 79   Temp 98 F (36.7 C)   Resp 16   Ht 5\' 6"  (1.676 m)   Wt 210 lb 3.2 oz (95.3 kg)   LMP 05/25/1988   SpO2 98%   BMI 33.93 kg/m  Wt Readings from Last 3 Encounters:  11/14/22 210 lb 3.2 oz (95.3 kg)  07/14/22 201 lb 12.8 oz (91.5 kg)  04/14/22 203 lb 12.8 oz (92.4 kg)    Physical Exam   Outpatient Encounter Medications as of 11/14/2022  Medication Sig   allopurinol (ZYLOPRIM) 100 MG tablet Take 1 tablet (100 mg total) by mouth daily.   acetaminophen (TYLENOL) 650 MG CR tablet Take 650-1,300 mg by mouth every 8 (eight) hours as needed for pain.   aspirin 81 MG chewable tablet Chew 2 tablets (162 mg total) by mouth daily.   atorvastatin (LIPITOR) 40 MG tablet Take 1 tablet by mouth once daily   celecoxib (CELEBREX) 200 MG capsule Take 1 capsule (200 mg total) by mouth 2 (two) times daily.   clopidogrel (PLAVIX) 75 MG tablet Take 1 tablet by mouth once daily with breakfast   fexofenadine (ALLEGRA) 180 MG tablet Take 1 tablet (180 mg total) by mouth daily. (Patient taking differently: Take 180 mg by mouth daily as needed for allergies.)   folic acid (FOLVITE) 1 MG tablet Take 1 mg by mouth every evening.     glucose blood (BAYER CONTOUR TEST) test strip USE  STRIP TO CHECK GLUCOSE TWICE DAILY. Dx: E11.9   ipratropium (ATROVENT) 0.06 % nasal spray Place 2 sprays into both nostrils 4 (four) times daily. (Patient taking differently: Place 2 sprays into both nostrils 4 (four) times daily as needed for rhinitis.)   losartan (COZAAR) 100 MG tablet Take 1 tablet (100 mg total) by mouth daily.   magnesium oxide (MAG-OX) 400 (240 Mg) MG tablet Take 1 tablet (400 mg total) by mouth 2 (two) times daily. (Patient taking differently: Take 400 mg by mouth 2 (two) times daily. Pt states she takes four tablets daily)   metFORMIN (GLUCOPHAGE) 500 MG tablet Take 1 tablet by mouth once daily   methotrexate (RHEUMATREX) 2.5 MG tablet Take 10 mg by mouth every Wednesday.   metoprolol succinate (TOPROL-XL) 50 MG 24 hr tablet Take 1 tablet (50 mg total) by mouth daily.   MICROLET LANCETS MISC Check sugar once daily, [employer] Microlet Lancets. Dx E11.9   mupirocin ointment (BACTROBAN) 2 % Apply 1 Application topically 2 (two) times daily.   pantoprazole (PROTONIX) 40 MG tablet Take 1 tablet by mouth once daily   torsemide (DEMADEX) 20 MG tablet Take 20 mg by mouth as needed. Taking 1/2 tablet just as needed   traMADol (ULTRAM) 50 MG tablet Take 1 tablet (50 mg total) by mouth every 4 (four) hours as needed for moderate pain.   triamcinolone cream (KENALOG) 0.1 % Apply 1 application topically 2 (two) times daily. To lower legs before putting on and/or after removing compression hose for redness in legs   trospium (SANCTURA) 20 MG tablet Take 1 tablet (20 mg total) by mouth at bedtime.   [DISCONTINUED] allopurinol (ZYLOPRIM) 300 MG tablet Take 150 mg by mouth daily.   [DISCONTINUED] losartan (COZAAR) 100 MG tablet Take 1 tablet by mouth once daily   [  DISCONTINUED] metoprolol succinate (TOPROL-XL) 50 MG 24 hr tablet Take 1 tablet by mouth once daily   No facility-administered encounter medications on file as of 11/14/2022.      Lab Results  Component Value Date   WBC 5.7 04/28/2022   HGB 11.9 (L) 04/28/2022   HCT 37.7 04/28/2022   PLT 130.0 (L) 04/28/2022   GLUCOSE 88 11/10/2022   CHOL 89 11/10/2022   TRIG 94.0 11/10/2022   HDL 36.90 (L) 11/10/2022   LDLCALC 33 11/10/2022   ALT 19 11/10/2022   AST 27 11/10/2022   NA 138 11/10/2022   K 4.2 11/10/2022   CL 104 11/10/2022   CREATININE 0.92 11/10/2022   BUN 19 11/10/2022   CO2 29 11/10/2022   TSH 4.73 03/21/2022   INR 1.1 03/25/2021   HGBA1C 5.8 11/10/2022   MICROALBUR 17.6 (H) 12/19/2021    MM 3D SCREEN BREAST BILATERAL  Result Date: 02/05/2022 CLINICAL DATA:  Screening. EXAM: DIGITAL SCREENING BILATERAL MAMMOGRAM WITH TOMOSYNTHESIS AND CAD TECHNIQUE: Bilateral screening digital craniocaudal and mediolateral oblique mammograms were obtained. Bilateral screening digital breast tomosynthesis was performed. The images were evaluated with computer-aided detection. COMPARISON:  Previous exam(s). ACR Breast Density Category b: There are scattered areas of fibroglandular density. FINDINGS: There are no findings suspicious for malignancy. IMPRESSION: No mammographic evidence of malignancy. A result letter of this screening mammogram will be mailed directly to the patient. RECOMMENDATION: Screening mammogram in one year. (Code:[id number]-B-01Y) BI-RADS CATEGORY  1: Negative. Electronically Signed   By: Frederico Hamman M.D.   On: 02/05/2022 12:37       Assessment & Plan:  There are no diagnoses linked to this encounter.   Dale [place], MD

## 2022-11-17 ENCOUNTER — Telehealth: Payer: Self-pay | Admitting: Internal Medicine

## 2022-11-17 ENCOUNTER — Encounter: Payer: Self-pay | Admitting: Internal Medicine

## 2022-11-17 DIAGNOSIS — I429 Cardiomyopathy, unspecified: Secondary | ICD-10-CM | POA: Insufficient documentation

## 2022-11-17 DIAGNOSIS — R2689 Other abnormalities of gait and mobility: Secondary | ICD-10-CM | POA: Insufficient documentation

## 2022-11-17 NOTE — Assessment & Plan Note (Signed)
Followed by [facility]/rheumatology.  

## 2022-11-17 NOTE — Assessment & Plan Note (Signed)
Avoid antiinflammatories.  Stay hydrated.  Follow metabolic panel. Continue losartan. Adjusted dose of allopurinol.

## 2022-11-17 NOTE — Assessment & Plan Note (Signed)
On MTX.  Followed by rheumatology.  

## 2022-11-17 NOTE — Assessment & Plan Note (Signed)
No upper symptoms reported.  Continue protonix.  

## 2022-11-17 NOTE — Assessment & Plan Note (Signed)
Echocardiogram on 01/01/2022 had showed moderate LV systolic dysfunction EF 30-35%, regionally impaired with anterior and apical hypokinesis and akinetic septum with paradoxical septal wall motion most consistent with left bundle branch block, although some concern for possible coronary disease. Additionally, echocardiogram showed significant mitral annular calcification with moderate MR. Severe left ventricular hypertrophy. Followed by cardiology.  Had recommended lexi scan.  Pt declined.  Currently symptoms stable.

## 2022-11-17 NOTE — Assessment & Plan Note (Signed)
S/p amputation.  Followed by [facility].  ?

## 2022-11-17 NOTE — Assessment & Plan Note (Signed)
S/p angiogram, angioplasty and toe amputation.  Continue lipitor and plavix. Doing well.   

## 2022-11-17 NOTE — Assessment & Plan Note (Signed)
Continue metoprolol, losartan and amlodipine.  Follow pressures.  Follow metabolic panel.  

## 2022-11-17 NOTE — Assessment & Plan Note (Signed)
Echocardiogram on 01/01/2022 had showed moderate LV systolic dysfunction EF 30-35%, regionally impaired with anterior and apical hypokinesis and akinetic septum with paradoxical septal wall motion most consistent with left bundle branch block, although some concern for possible coronary disease. Additionally, echocardiogram showed significant mitral annular calcification with moderate MR. Severe left ventricular hypertrophy.  Followed by cardiology.  Currently stable.

## 2022-11-17 NOTE — Assessment & Plan Note (Signed)
Colonoscopy 09/26/21.  

## 2022-11-17 NOTE — Assessment & Plan Note (Signed)
Mentioned noticing she had some balance issues.  Discussed using walker.  Discussed PT.

## 2022-11-17 NOTE — Assessment & Plan Note (Signed)
Continue f/u with pulmonary/cardiology.

## 2022-11-17 NOTE — Telephone Encounter (Signed)
Patient says she is ok to go to physical therapy. She mentioned [facility]. Advised that we cannot refer there. She does not have a preference.

## 2022-11-17 NOTE — Assessment & Plan Note (Signed)
Dr Stoioff (01/30/22) - stable.  F/u one year.  

## 2022-11-17 NOTE — Assessment & Plan Note (Signed)
Stable on allopurinol.  Rx sent in for 100mg  allopurinol.  Dose decrease for renal function.

## 2022-11-17 NOTE — Assessment & Plan Note (Signed)
On metformin.  Low carb diet and exercise.  Follow met b and a1c.  

## 2022-11-17 NOTE — Assessment & Plan Note (Signed)
Continue lipitor  ?

## 2022-11-17 NOTE — Assessment & Plan Note (Signed)
Continue lipitor.  Low cholesterol diet and exercise.  Follow lipid panel and liver function tests.   

## 2022-11-17 NOTE — Assessment & Plan Note (Signed)
Follow cbc.  

## 2022-11-17 NOTE — Telephone Encounter (Signed)
She had mentioned some balance issues at her appt.  Is she interested in PT. If so, do they have a preference of which PT?

## 2022-11-19 NOTE — Telephone Encounter (Signed)
Please notify Ms Doiron that I called [facility].  They are not accepting outside referrals.  Let her know that I have contacted rheumatology to see if they can place a referral to PT at [facility].  Let me know if she hears (or does not hear).  If unable to place at [facility], I can place referral for another outpatient PT.

## 2022-11-19 NOTE — Telephone Encounter (Signed)
Patient aware of below.

## 2022-11-21 ENCOUNTER — Other Ambulatory Visit: Payer: Self-pay

## 2022-11-21 ENCOUNTER — Emergency Department: Payer: Medicare Other

## 2022-11-21 ENCOUNTER — Inpatient Hospital Stay
Admission: EM | Admit: 2022-11-21 | Discharge: 2022-11-27 | DRG: 321 | Disposition: A | Discharge status: Skilled Nursing Facility | Payer: Medicare Other | Attending: Student in an Organized Health Care Education/Training Program | Admitting: Student in an Organized Health Care Education/Training Program

## 2022-11-21 DIAGNOSIS — M069 Rheumatoid arthritis, unspecified: Secondary | ICD-10-CM | POA: Diagnosis present

## 2022-11-21 DIAGNOSIS — I214 Non-ST elevation (NSTEMI) myocardial infarction: Principal | ICD-10-CM | POA: Diagnosis present

## 2022-11-21 DIAGNOSIS — J849 Interstitial pulmonary disease, unspecified: Secondary | ICD-10-CM | POA: Diagnosis present

## 2022-11-21 DIAGNOSIS — I5033 Acute on chronic diastolic (congestive) heart failure: Secondary | ICD-10-CM | POA: Diagnosis present

## 2022-11-21 DIAGNOSIS — I272 Pulmonary hypertension, unspecified: Secondary | ICD-10-CM | POA: Diagnosis present

## 2022-11-21 DIAGNOSIS — E7800 Pure hypercholesterolemia, unspecified: Secondary | ICD-10-CM | POA: Diagnosis present

## 2022-11-21 DIAGNOSIS — Z8249 Family history of ischemic heart disease and other diseases of the circulatory system: Secondary | ICD-10-CM

## 2022-11-21 DIAGNOSIS — M341 CR(E)ST syndrome: Secondary | ICD-10-CM | POA: Diagnosis present

## 2022-11-21 DIAGNOSIS — Z79899 Other long term (current) drug therapy: Secondary | ICD-10-CM | POA: Diagnosis not present

## 2022-11-21 DIAGNOSIS — Z8616 Personal history of COVID-19: Secondary | ICD-10-CM

## 2022-11-21 DIAGNOSIS — I130 Hypertensive heart and chronic kidney disease with heart failure and stage 1 through stage 4 chronic kidney disease, or unspecified chronic kidney disease: Secondary | ICD-10-CM | POA: Diagnosis present

## 2022-11-21 DIAGNOSIS — G8929 Other chronic pain: Secondary | ICD-10-CM | POA: Diagnosis present

## 2022-11-21 DIAGNOSIS — J8410 Pulmonary fibrosis, unspecified: Secondary | ICD-10-CM | POA: Diagnosis present

## 2022-11-21 DIAGNOSIS — E785 Hyperlipidemia, unspecified: Secondary | ICD-10-CM | POA: Diagnosis not present

## 2022-11-21 DIAGNOSIS — N183 Chronic kidney disease, stage 3 unspecified: Secondary | ICD-10-CM | POA: Diagnosis present

## 2022-11-21 DIAGNOSIS — R079 Chest pain, unspecified: Secondary | ICD-10-CM | POA: Diagnosis not present

## 2022-11-21 DIAGNOSIS — Z881 Allergy status to other antibiotic agents status: Secondary | ICD-10-CM | POA: Diagnosis not present

## 2022-11-21 DIAGNOSIS — Z87891 Personal history of nicotine dependence: Secondary | ICD-10-CM

## 2022-11-21 DIAGNOSIS — Z7984 Long term (current) use of oral hypoglycemic drugs: Secondary | ICD-10-CM | POA: Diagnosis not present

## 2022-11-21 DIAGNOSIS — R5381 Other malaise: Secondary | ICD-10-CM | POA: Diagnosis present

## 2022-11-21 DIAGNOSIS — Z9981 Dependence on supplemental oxygen: Secondary | ICD-10-CM

## 2022-11-21 DIAGNOSIS — E119 Type 2 diabetes mellitus without complications: Secondary | ICD-10-CM | POA: Diagnosis not present

## 2022-11-21 DIAGNOSIS — I10 Essential (primary) hypertension: Secondary | ICD-10-CM | POA: Insufficient documentation

## 2022-11-21 DIAGNOSIS — E1122 Type 2 diabetes mellitus with diabetic chronic kidney disease: Secondary | ICD-10-CM | POA: Diagnosis present

## 2022-11-21 DIAGNOSIS — Z89422 Acquired absence of other left toe(s): Secondary | ICD-10-CM

## 2022-11-21 DIAGNOSIS — Z955 Presence of coronary angioplasty implant and graft: Secondary | ICD-10-CM | POA: Diagnosis not present

## 2022-11-21 DIAGNOSIS — Z96651 Presence of right artificial knee joint: Secondary | ICD-10-CM | POA: Diagnosis present

## 2022-11-21 DIAGNOSIS — M170 Bilateral primary osteoarthritis of knee: Secondary | ICD-10-CM | POA: Diagnosis present

## 2022-11-21 DIAGNOSIS — Z7982 Long term (current) use of aspirin: Secondary | ICD-10-CM

## 2022-11-21 DIAGNOSIS — Z1152 Encounter for screening for COVID-19: Secondary | ICD-10-CM

## 2022-11-21 DIAGNOSIS — Z85828 Personal history of other malignant neoplasm of skin: Secondary | ICD-10-CM

## 2022-11-21 DIAGNOSIS — Z8261 Family history of arthritis: Secondary | ICD-10-CM

## 2022-11-21 DIAGNOSIS — Z7902 Long term (current) use of antithrombotics/antiplatelets: Secondary | ICD-10-CM

## 2022-11-21 DIAGNOSIS — I2510 Atherosclerotic heart disease of native coronary artery without angina pectoris: Secondary | ICD-10-CM | POA: Diagnosis present

## 2022-11-21 DIAGNOSIS — M109 Gout, unspecified: Secondary | ICD-10-CM | POA: Diagnosis present

## 2022-11-21 DIAGNOSIS — Z888 Allergy status to other drugs, medicaments and biological substances status: Secondary | ICD-10-CM

## 2022-11-21 DIAGNOSIS — I447 Left bundle-branch block, unspecified: Secondary | ICD-10-CM | POA: Diagnosis present

## 2022-11-21 LAB — BASIC METABOLIC PANEL
Anion gap: 9 (ref 5–15)
BUN: 21 mg/dL (ref 8–23)
CO2: 22 mmol/L (ref 22–32)
Calcium: 9.7 mg/dL (ref 8.9–10.3)
Chloride: 104 mmol/L (ref 98–111)
Creatinine, Ser: 0.97 mg/dL (ref 0.44–1.00)
GFR, Estimated: 59 mL/min — ABNORMAL LOW (ref >60)
Glucose, Bld: 149 mg/dL — ABNORMAL HIGH (ref 70–99)
Potassium: 4.2 mmol/L (ref 3.5–5.1)
Sodium: 135 mmol/L (ref 135–145)

## 2022-11-21 LAB — TROPONIN I (HIGH SENSITIVITY)
Troponin I (High Sensitivity): 13770 ng/L (ref <18)
Troponin I (High Sensitivity): 13227 ng/L (ref <18)

## 2022-11-21 LAB — CBC
HCT: 39.1 % (ref 36.0–46.0)
Hemoglobin: 12 g/dL (ref 12.0–15.0)
MCH: 30 pg (ref 26.0–34.0)
MCHC: 30.7 g/dL (ref 30.0–36.0)
MCV: 97.8 fL (ref 80.0–100.0)
Platelets: 111 10*3/uL — ABNORMAL LOW (ref 150–400)
RBC: 4 MIL/uL (ref 3.87–5.11)
RDW: 19.5 % — ABNORMAL HIGH (ref 11.5–15.5)
WBC: 5.3 10*3/uL (ref 4.0–10.5)
nRBC: 0 % (ref 0.0–0.2)

## 2022-11-21 LAB — CBG MONITORING, ED
Glucose-Capillary: 148 mg/dL — ABNORMAL HIGH (ref 70–99)
Glucose-Capillary: 168 mg/dL — ABNORMAL HIGH (ref 70–99)

## 2022-11-21 LAB — APTT: aPTT: 34 seconds (ref 24–36)

## 2022-11-21 LAB — PROTIME-INR
INR: 1.1 (ref 0.8–1.2)
Prothrombin Time: 14.5 seconds (ref 11.4–15.2)

## 2022-11-21 LAB — SARS CORONAVIRUS 2 BY RT PCR: SARS Coronavirus 2 by RT PCR: NEGATIVE

## 2022-11-21 MED ORDER — METOPROLOL SUCCINATE ER 50 MG PO TB24
50.0000 mg | ORAL_TABLET | Freq: Every day | ORAL | Status: DC
  Administered 2022-11-22: 50 mg via ORAL
  Filled 2022-11-21: qty 1

## 2022-11-21 MED ORDER — FESOTERODINE FUMARATE ER 4 MG PO TB24: 4.0000 mg | ORAL_TABLET | Freq: Every day | ORAL | Status: DC

## 2022-11-21 MED ORDER — NITROGLYCERIN IN D5W 200-5 MCG/ML-% IV SOLN
0.0000 ug/min | INTRAVENOUS | Status: DC
  Administered 2022-11-21: 5 ug/min via INTRAVENOUS
  Administered 2022-11-22: 60 ug/min via INTRAVENOUS
  Administered 2022-11-22: 40 ug/min via INTRAVENOUS
  Filled 2022-11-21 (×2): qty 250

## 2022-11-21 MED ORDER — FOLIC ACID 1 MG PO TABS
1.0000 mg | ORAL_TABLET | Freq: Every evening | ORAL | Status: DC
  Administered 2022-11-22 – 2022-11-26 (×5): 1 mg via ORAL
  Filled 2022-11-21 (×5): qty 1

## 2022-11-21 MED ORDER — METHOTREXATE 2.5 MG PO TABS
10.0000 mg | ORAL_TABLET | ORAL | Status: DC
  Administered 2022-11-26: 10 mg via ORAL
  Filled 2022-11-21: qty 4

## 2022-11-21 MED ORDER — ONDANSETRON HCL 4 MG/2ML IJ SOLN: 4.0000 mg | Freq: Four times a day (QID) | INTRAMUSCULAR | Status: DC | PRN

## 2022-11-21 MED ORDER — TORSEMIDE 20 MG PO TABS: 10.0000 mg | ORAL_TABLET | ORAL | Status: DC | PRN

## 2022-11-21 MED ORDER — ATORVASTATIN CALCIUM 80 MG PO TABS
80.0000 mg | ORAL_TABLET | Freq: Every day | ORAL | Status: DC
  Administered 2022-11-22 – 2022-11-27 (×6): 80 mg via ORAL
  Filled 2022-11-21: qty 1
  Filled 2022-11-21: qty 4
  Filled 2022-11-21 (×4): qty 1

## 2022-11-21 MED ORDER — ASPIRIN 81 MG PO CHEW: 324.0000 mg | CHEWABLE_TABLET | ORAL | Status: AC

## 2022-11-21 MED ORDER — MAGNESIUM HYDROXIDE 400 MG/5ML PO SUSP: 30.0000 mL | Freq: Every day | ORAL | Status: DC | PRN

## 2022-11-21 MED ORDER — LOSARTAN POTASSIUM 50 MG PO TABS
100.0000 mg | ORAL_TABLET | Freq: Every day | ORAL | Status: DC
  Administered 2022-11-22 – 2022-11-27 (×6): 100 mg via ORAL
  Filled 2022-11-21 (×6): qty 2

## 2022-11-21 MED ORDER — LORATADINE 10 MG PO TABS: 10.0000 mg | ORAL_TABLET | Freq: Every day | ORAL | Status: DC | PRN

## 2022-11-21 MED ORDER — CLOPIDOGREL BISULFATE 75 MG PO TABS
75.0000 mg | ORAL_TABLET | Freq: Every day | ORAL | Status: DC
  Administered 2022-11-22 – 2022-11-27 (×5): 75 mg via ORAL
  Filled 2022-11-21 (×5): qty 1

## 2022-11-21 MED ORDER — INSULIN ASPART 100 UNIT/ML IJ SOLN: 0.0000 [IU] | Freq: Every day | INTRAMUSCULAR | Status: DC

## 2022-11-21 MED ORDER — INSULIN ASPART 100 UNIT/ML IJ SOLN
0.0000 [IU] | Freq: Three times a day (TID) | INTRAMUSCULAR | Status: DC
  Administered 2022-11-23: 1 [IU] via SUBCUTANEOUS
  Administered 2022-11-25: 2 [IU] via SUBCUTANEOUS
  Filled 2022-11-21 (×2): qty 1

## 2022-11-21 MED ORDER — TRAZODONE HCL 50 MG PO TABS: 25.0000 mg | ORAL_TABLET | Freq: Every evening | ORAL | Status: DC | PRN

## 2022-11-21 MED ORDER — METOPROLOL TARTRATE 5 MG/5ML IV SOLN
5.0000 mg | Freq: Once | INTRAVENOUS | Status: AC
  Administered 2022-11-21: 5 mg via INTRAVENOUS
  Filled 2022-11-21: qty 5

## 2022-11-21 MED ORDER — ALPRAZOLAM 0.25 MG PO TABS: 0.2500 mg | ORAL_TABLET | Freq: Two times a day (BID) | ORAL | Status: DC | PRN

## 2022-11-21 MED ORDER — NITROGLYCERIN 0.4 MG SL SUBL
0.4000 mg | SUBLINGUAL_TABLET | SUBLINGUAL | Status: DC | PRN
  Filled 2022-11-21: qty 1

## 2022-11-21 MED ORDER — HEPARIN (PORCINE) 25000 UT/250ML-% IV SOLN
1650.0000 [IU]/h | INTRAVENOUS | Status: DC
  Administered 2022-11-21: 1000 [IU]/h via INTRAVENOUS
  Administered 2022-11-23 (×2): 1650 [IU]/h via INTRAVENOUS
  Filled 2022-11-21 (×4): qty 250

## 2022-11-21 MED ORDER — PANTOPRAZOLE SODIUM 40 MG PO TBEC
40.0000 mg | DELAYED_RELEASE_TABLET | Freq: Every day | ORAL | Status: DC
  Administered 2022-11-22 – 2022-11-27 (×6): 40 mg via ORAL
  Filled 2022-11-21 (×6): qty 1

## 2022-11-21 MED ORDER — ASPIRIN 81 MG PO CHEW
324.0000 mg | CHEWABLE_TABLET | Freq: Once | ORAL | Status: DC
  Filled 2022-11-21: qty 4

## 2022-11-21 MED ORDER — HEPARIN BOLUS VIA INFUSION
4000.0000 [IU] | Freq: Once | INTRAVENOUS | Status: AC
  Administered 2022-11-21: 4000 [IU] via INTRAVENOUS
  Filled 2022-11-21: qty 4000

## 2022-11-21 MED ORDER — ALLOPURINOL 100 MG PO TABS
100.0000 mg | ORAL_TABLET | Freq: Every day | ORAL | Status: DC
  Administered 2022-11-22 – 2022-11-27 (×6): 100 mg via ORAL
  Filled 2022-11-21 (×6): qty 1

## 2022-11-21 MED ORDER — SODIUM CHLORIDE 0.9 % IV SOLN: INTRAVENOUS | Status: DC

## 2022-11-21 MED ORDER — ACETAMINOPHEN 325 MG PO TABS
650.0000 mg | ORAL_TABLET | ORAL | Status: DC | PRN
  Administered 2022-11-26 – 2022-11-27 (×2): 650 mg via ORAL
  Filled 2022-11-21 (×2): qty 2

## 2022-11-21 MED ORDER — ASPIRIN 300 MG RE SUPP: 300.0000 mg | RECTAL | Status: AC

## 2022-11-21 NOTE — H&P (Signed)
[place]   PATIENT NAME: Nina Perez    MR#:  [id number]  DATE OF BIRTH:  March 27, 1941  DATE OF ADMISSION:  11/21/2022  PRIMARY CARE PHYSICIAN: Dale Ridgeway, MD   Patient is coming from: Home  REQUESTING/REFERRING PHYSICIAN: Shaune Pollack, MD  CHIEF COMPLAINT:   Chief Complaint  Patient presents with   Chest Pain    HISTORY OF PRESENT ILLNESS:  Nina Perez is a 82 y.o. Caucasian female with medical history significant for stage III chronic kidney disease, GERD, with hiatal hernia, hypertension, dyslipidemia, ILD, left bundle branch block, PAD, RA, and type 2 diabetes mellitus, who presented to the emergency room with acute onset of left parasternal chest tightness graded 5/10 in severity with associated palpitations and radiation to both arms.  She denies any associated nausea or vomiting or diaphoresis.  She admits to cough productive of clear sputum without wheezing or hemoptysis.  No leg pain or edema or recent travels or surgeries.  No headache or dizziness or blurred vision.  No dysuria, oliguria, hematuria or flank pain.  No other bleeding diathesis.  ED Course: [place] to the ER, BP was 190/83 with temperature of 100.5 and otherwise normal vital signs.  Later on temperature was 98.8.  Labs revealed blood glucose of 149 with otherwise unremarkable BMP.  High sensitive troponin I was 13,770 and later on 13,227.  Platelets were 111 with otherwise unremarkable CBC.  COVID-19 PCR came back negative  EKG as reviewed by me : Initial EKG showed normal sinus rhythm with a rate of 79 with left bundle branch block.   Imaging: Chest x-ray showed cardiomegaly.  The patient was given IV heparin bolus and drip, 5 mg of IV Lopressor, IV nitroglycerin drip and 4 baby aspirin.  She will be admitted to a progressive unit bed for further evaluation and management.   PAST MEDICAL HISTORY:   Past Medical History:  Diagnosis Date   Adenomatous colon polyp    Aortic atherosclerosis  (HCC)    Barrett's esophagus    Cardiac murmur    CKD (chronic kidney disease), stage III (HCC)    Dyspnea on exertion    GERD (gastroesophageal reflux disease)    Gout    Hiatal hernia    History of 2019 novel coronavirus disease (COVID-19) 08/2020   Hypercholesterolemia    Hypertension    ILD (interstitial lung disease) (HCC)    a.) 2/2 scleroderma Dx   LBBB (left bundle branch block)    Long term current use of antithrombotics/antiplatelets    a.) ASA + clopidogrel   Long term current use of immunosuppressive drug    a.) MTX for RA and scleroderma Dx   OSA on CPAP    Osteoarthritis    a.) knees, spine   PAD (peripheral artery disease) (HCC)    Phlebitis    a.) x 2 (with pregnancy)   Pulmonary fibrosis (HCC)    a.) mild   Pulmonary hypertension (HCC) 02/24/2012   a.) TTE 02/24/2012: EF 55%, RVSP 33; b.) TTE 10/12/2014: EF >55%, RVSP 36.1; c.) TTE 09/02/2017: EF 55%, RVSP 45.5   Reflux esophagitis    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 b.) renal US 05/23/2020: solid LEFT renal mass measuring 1.4 x 1.5 x 1.6. 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, sclerodactyly, telangiectasias   Skin cancer 2018   Supplemental oxygen dependent    a.) 2L/[place] PRN for DOE related to mild pulmonary fibrosis and pulmonary HTN   T2DM (type 2 diabetes mellitus) (HCC)     PAST SURGICAL HISTORY:   Past Surgical History:  Procedure Laterality Date   ABDOMINAL AORTOGRAM W/LOWER EXTREMITY Left 05/16/2019   Procedure: ABDOMINAL AORTOGRAM W/LOWER EXTREMITY;  Surgeon: Runell Gess, MD;  Location: [facility];  Service: Cardiovascular;  Laterality: Left;   AMPUTATION TOE Left 06/02/2019    Procedure: AMPUTATION TOE;  Surgeon: Recardo Evangelist, DPM;  Location: [facility];  Service: Podiatry;  Laterality: Left;   APPENDECTOMY  1972   BREAST CYST ASPIRATION Left    neg   COLONOSCOPY WITH PROPOFOL N/A 09/26/2021   Procedure: COLONOSCOPY WITH PROPOFOL;  Surgeon: Jaynie Collins, DO;  Location: [facility] ENDOS[facility];  Service: Gastroenterology;  Laterality: N/A;  DM, WHEELCHAIR, PLAVIX   ESOPHAGOGASTRODUODENOSCOPY (EGD) WITH PROPOFOL N/A 09/26/2021   Procedure: ESOPHAGOGASTRODUODENOSCOPY (EGD) WITH PROPOFOL;  Surgeon: Jaynie Collins, DO;  Location: [facility] ENDOSCOPY;  Service: Gastroenterology;  Laterality: N/A;   INCISION AND DRAINAGE OF WOUND Left 06/07/2019   Procedure: IRRIGATION AND DEBRIDEMENT WOUND;  Surgeon: Recardo Evangelist, DPM;  Location: [facility] OR[facility];  Service: Podiatry;  Laterality: Left;   KNEE ARTHROPLASTY Right 11/13/2021   Procedure: COMPUTER ASSISTED TOTAL KNEE ARTHROPLASTY;  Surgeon: Donato Heinz, MD;  Location: [facility] OR[facility];  Service: Orthopedics;  Laterality: Right;   LOWER EXTREMITY ANGIOGRAPHY Left 06/03/2019   Procedure: Lower Extremity Angiography;  Surgeon: Renford Dills, MD;  Location: [facility];  Service: Cardiovascular;  Laterality: Left;   LOWER EXTREMITY ANGIOGRAPHY Right 05/21/2021   Procedure: LOWER EXTREMITY ANGIOGRAPHY;  Surgeon: Renford Dills, MD;  Location: [facility];  Service: Cardiovascular;  Laterality: Right;   PERIPHERAL VASCULAR BALLOON ANGIOPLASTY Left 05/16/2019   Procedure: PERIPHERAL VASCULAR BALLOON ANGIOPLASTY;  Surgeon: Runell Gess, MD;  Location: [facility];  Service: Cardiovascular;  Laterality: Left;  SFA   TUBAL LIGATION  1972   VEIN LIGATION AND STRIPPING      SOCIAL HISTORY:   Social History   Tobacco Use   Smoking status: Former    Current packs/day: 0.00    Average packs/day: 0.5 packs/day for 15.0 years (7.5 ttl pk-yrs)    Types: Cigarettes    Start date: 05/12/1973    Quit date:  05/12/1988    Years since quitting: 34.5   Smokeless tobacco: Never  Substance Use Topics   Alcohol use: No    Alcohol/week: 0.0 standard drinks of alcohol    FAMILY HISTORY:   Family History  Problem Relation Age of Onset   Arthritis Mother    Heart attack Father    Cancer Sister    Cancer - Prostate Brother    Colon cancer Neg Hx    Breast cancer Neg Hx     DRUG ALLERGIES:   Allergies  Allergen Reactions   Maxzide [Triamterene-Hctz] Other (See Comments)    Weakness/fatigue    Tape Other (See Comments)    Ob band aid adhesive  Pull skin off / Rash   Cephalexin Rash    Blotchiness to B lower legs after several days of cephalexin for UTI   Hydrochlorothiazide W-Triamterene     Other reaction(s): Other (See Comments) Extreme fatigue/weakness  REVIEW OF SYSTEMS:   ROS As per history of present illness. All pertinent systems were reviewed above. Constitutional, HEENT, cardiovascular, respiratory, GI, GU, musculoskeletal, neuro, psychiatric, endocrine, integumentary and hematologic systems were reviewed and are otherwise negative/unremarkable except for positive findings mentioned above in the HPI.   MEDICATIONS AT HOME:   Prior to Admission medications   Medication Sig Start Date End Date Taking? Authorizing Provider  acetaminophen (TYLENOL) 650 MG CR tablet Take 650-1,300 mg by mouth every 8 (eight) hours as needed for pain.   Yes [provider]  allopurinol (ZYLOPRIM) 100 MG tablet Take 1 tablet (100 mg total) by mouth daily. 11/14/22  Yes Dale [place]uther[place], MD  aspirin 81 MG chewable tablet Chew 2 tablets (162 mg total) by mouth daily. 11/14/21  Yes Lasandra Beech B, PA-C  atorvastatin (LIPITOR) 40 MG tablet Take 1 tablet by mouth once daily 06/23/22  Yes Dale [place], MD  clopidogrel (PLAVIX) 75 MG tablet Take 1 tablet by mouth once daily with breakfast 09/17/20  Yes Runell Gess, MD  fexofenadine (ALLEGRA) 180 MG tablet Take 1 tablet (180 mg total) by  mouth daily. Patient taking differently: Take 180 mg by mouth daily as needed for allergies. 09/10/20  Yes Becky [place], NP  folic acid (FOLVITE) 1 MG tablet Take 1 mg by mouth every evening.    Yes [provider]  glucose blood (BAYER CONTOUR TEST) test strip USE  STRIP TO CHECK GLUCOSE TWICE DAILY. Dx: E11.9 05/29/17  Yes Dale [place], MD  ipratropium (ATROVENT) 0.06 % nasal spray Place 2 sprays into both nostrils 4 (four) times daily. Patient taking differently: Place 2 sprays into both nostrils 4 (four) times daily as needed for rhinitis. 09/10/20  Yes Becky [place], NP  losartan (COZAAR) 100 MG tablet Take 1 tablet (100 mg total) by mouth daily. 11/14/22  Yes Dale [place], MD  metFORMIN (GLUCOPHAGE) 500 MG tablet Take 1 tablet by mouth once daily 04/18/22  Yes Dale [place], MD  methotrexate (RHEUMATREX) 2.5 MG tablet Take 10 mg by mouth every Wednesday. 01/10/20  Yes [provider]  metoprolol succinate (TOPROL-XL) 50 MG 24 hr tablet Take 1 tablet (50 mg total) by mouth daily. 11/14/22  Yes Dale [place], MD  MICROLET LANCETS MISC Check sugar once daily, [facility] Microlet Lancets. Dx E11.9 10/02/14  Yes Dale [place], MD  pantoprazole (PROTONIX) 40 MG tablet Take 1 tablet by mouth once daily 09/26/22  Yes Dale [place], MD  torsemide (DEMADEX) 20 MG tablet Take 20 mg by mouth as needed. Taking 1/2 tablet just as needed   Yes [provider]  celecoxib (CELEBREX) 200 MG capsule Take 1 capsule (200 mg total) by mouth 2 (two) times daily. Patient not taking: Reported on 11/21/2022 11/14/21   Lasandra Beech B, PA-C  magnesium oxide (MAG-OX) 400 (240 Mg) MG tablet Take 1 tablet (400 mg total) by mouth 2 (two) times daily. Patient not taking: Reported on 11/21/2022 12/20/21   Dale [place], MD  mupirocin ointment (BACTROBAN) 2 % Apply 1 Application topically 2 (two) times daily. Patient not taking: Reported on 11/21/2022 04/16/22   Dale [place], MD  traMADol (ULTRAM) 50  MG tablet Take 1 tablet (50 mg total) by mouth every 4 (four) hours as needed for moderate pain. Patient not taking: Reported on 11/21/2022 11/14/21   Lasandra Beech B, PA-C  triamcinolone cream (KENALOG) 0.1 % Apply 1 application topically 2 (two) times daily. To lower legs before putting on and/or after removing compression hose for redness in  legs Patient not taking: Reported on 11/21/2022 04/02/21   Isa Rankin, MD  trospium (SANCTURA) 20 MG tablet Take 1 tablet (20 mg total) by mouth at bedtime. Patient not taking: Reported on 11/21/2022 02/03/22   Riki Altes, MD      VITAL SIGNS:  Blood pressure 135/67, pulse 75, temperature 99 F (37.2 C), temperature source Oral, resp. rate (!) 22, height 5\' 6"  (1.676 m), weight 95.3 kg, last menstrual period 05/25/1988, SpO2 92%.  PHYSICAL EXAMINATION:  Physical Exam  GENERAL:  82 y.o.-year-old Caucasian female patient lying in the bed with no acute distress.  EYES: Pupils equal, round, reactive to light and accommodation. No scleral icterus. Extraocular muscles intact.  HEENT: Head atraumatic, normocephalic. Oropharynx and nasopharynx clear.  NECK:  Supple, no jugular venous distention. No thyroid enlargement, no tenderness.  LUNGS: Normal breath sounds bilaterally, no wheezing, rales,rhonchi or crepitation. No use of accessory muscles of respiration.  CARDIOVASCULAR: Regular rate and rhythm, S1, S2 normal. No murmurs, rubs, or gallops.  ABDOMEN: Soft, nondistended, nontender. Bowel sounds present. No organomegaly or mass.  EXTREMITIES: No pedal edema, cyanosis, or clubbing.  NEUROLOGIC: Cranial nerves II through XII are intact. Muscle strength 5/5 in all extremities. Sensation intact. Gait not checked.  PSYCHIATRIC: The patient is alert and oriented x 3.  Normal affect and good eye contact. SKIN: No obvious rash, lesion, or ulcer.   LABORATORY PANEL:   CBC Recent Labs  Lab 11/21/22 1741  WBC 5.3  HGB 12.0  HCT 39.1  PLT 111*    ------------------------------------------------------------------------------------------------------------------  Chemistries  Recent Labs  Lab 11/21/22 1741  NA 135  K 4.2  CL 104  CO2 22  GLUCOSE 149*  BUN 21  CREATININE 0.97  CALCIUM 9.7   ------------------------------------------------------------------------------------------------------------------  Cardiac Enzymes No results for input(s): "TROPONINI" in the last 168 hours. ------------------------------------------------------------------------------------------------------------------  RADIOLOGY:  DG Chest 2 View  Result Date: 11/21/2022 CLINICAL DATA:  Chest tightness EXAM: CHEST - 2 VIEW COMPARISON:  08/30/2020, 06/17/2018 FINDINGS: Prominent right hilar vascular shadow. Cardiomegaly. No acute airspace disease, pleural effusion or pneumothorax IMPRESSION: Cardiomegaly. Electronically Signed   By: Jasmine Pang M.D.   On: 11/21/2022 18:13      IMPRESSION AND PLAN:  Assessment and Plan: * NSTEMI (non-ST elevated myocardial infarction) [facility]) - The patient will be admitted to a progressive unit bed. - We will continue IV heparin drip. - She will be placed on as needed sublingual nitroglycerin and IV morphine sulfate for pain. - We will continue IV nitro drip. - She will be placed on high-dose statin therapy as well as aspirin.  Will continue Plavix. - Beta-blocker therapy will be continued. - 2D echo be obtained. - Cardiology consult will be obtained. - I notified [facility] group about the patient.  Essential hypertension - We will continue to her antihypertensive therapy.  Dyslipidemia - We will continue statin therapy as mentioned above.  Type 2 diabetes mellitus without complications (HCC) - The patient will be placed on supplemental coverage with NovoLog. - We will hold off metformin.  Gout - We will continue allopurinol.    DVT prophylaxis: IV heparin Advanced Care Planning:  Code Status: full  code. Family Communication:  The plan of care was discussed in details with the patient (and family). I answered all questions. The patient agreed to proceed with the above mentioned plan. Further management will depend upon hospital course. Disposition Plan: Back to previous home environment Consults called: Cardiology.  Dr. Gala Romney was notified about the patient  and [facility] consult to be obtained. All the records are reviewed and case discussed with ED provider.  Status is: Inpatient   At the time of the admission, it appears that the appropriate admission status for this patient is inpatient.  This is judged to be reasonable and necessary in order to provide the required intensity of service to ensure the patient's safety given the presenting symptoms, physical exam findings and initial radiographic and laboratory data in the context of comorbid conditions.  The patient requires inpatient status due to high intensity of service, high risk of further deterioration and high frequency of surveillance required.  I certify that at the time of admission, it is my clinical judgment that the patient will require inpatient hospital care extending more than 2 midnights.                            Dispo: The patient is from: Home              Anticipated d/c is to: Home              Patient currently is not medically stable to d/c.              Difficult to place patient: No  Hannah Beat M.D on 11/22/2022 at 3:28 AM  [facility]   From 7 PM-7 AM, contact night-coverage [email or web address]  CC: Primary care physician; Dale [place], MD

## 2022-11-21 NOTE — ED Notes (Signed)
CBG 148 

## 2022-11-21 NOTE — ED Notes (Addendum)
Asked if Pt could have diet, MD Erma Heritage stated it would be okay. Meal tray given to Pt

## 2022-11-21 NOTE — ED Notes (Signed)
MD Erma Heritage made aware of critical troponin 13,770

## 2022-11-21 NOTE — Progress Notes (Addendum)
ANTICOAGULATION CONSULT NOTE - Initial Consult  Pharmacy Consult for Heparin drip Indication: chest pain/ACS  Allergies  Allergen Reactions   Maxzide [Triamterene-Hctz] Other (See Comments)    Weakness/fatigue    Tape Other (See Comments)    Ob band aid adhesive  Pull skin off / Rash   Cephalexin Rash    Blotchiness to B lower legs after several days of cephalexin for UTI   Hydrochlorothiazide W-Triamterene     Other reaction(s): Other (See Comments) Extreme fatigue/weakness    Patient Measurements: Height: 5\' 6"  (167.6 cm) Weight: 95.3 kg (210 lb 1.6 oz) IBW/kg (Calculated) : 59.3 Heparin Dosing Weight: 80.5 kg  Vital Signs: Temp: 100.5 F (38.1 C) (07/12 1739) Temp Source: Oral (07/12 1739) BP: 190/83 (07/12 1739) Pulse Rate: 79 (07/12 1739)  Labs: Recent Labs    11/21/22 1741  HGB 12.0  HCT 39.1  PLT 111*  CREATININE 0.97  TROPONINIHS 13,770*    Estimated Creatinine Clearance: 52.9 mL/min (by C-G formula based on SCr of 0.97 mg/dL).   Medical History: 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HCC)    a.) 2/2 scleroderma Dx   LBBB (left bundle branch block)    Long term current use of antithrombotics/antiplatelets    a.) ASA + clopidogrel   Long term current use of immunosuppressive drug    a.) MTX for RA and scleroderma Dx   OSA on CPAP    Osteoarthritis    a.) knees, spine   PAD (peripheral artery disease) (HCC)    Phlebitis    a.) x 2 (with pregnancy)   Pulmonary fibrosis (HCC)    a.) mild   Pulmonary hypertension (HCC) 02/24/2012   a.) TTE 02/24/2012: EF 55%, RVSP 33;  b.) TTE 10/12/2014: EF >55%, RVSP 36.1; c.) TTE 09/02/2017: EF 55%, RVSP 45.5   Reflux esophagitis    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 b.) renal US 05/23/2020: solid LEFT renal mass measuring 1.4 x 1.5 x 1.6. 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, sclerodactyly, telangiectasias   Skin cancer 2018   Supplemental oxygen dependent    a.) 2L/[place] PRN for DOE related to mild pulmonary fibrosis and pulmonary HTN   T2DM (type 2 diabetes mellitus) (HCC)     Medications:  (Not in a hospital admission)  Scheduled:   aspirin  324 mg Oral Once   heparin  4,000 Units Intravenous Once   Infusions:   heparin      Assessment: 82 yo F to start Heparin drip for ACS/STEMi, elevated troponin On ASA/plavix PTA per med rec Hgb 12.0   Plt 111  INR 1.1  aPTT 34  Goal of Therapy:  Heparin level 0.3-0.7 units/ml Monitor platelets by anticoagulation protocol: Yes  Plan:  Give 4000 units bolus x 1 Start heparin infusion at 1000 units/hr Check anti-Xa level in 8 hours and daily while on heparin Continue to monitor H&H and platelets  Bari Mantis PharmD Clinical Pharmacist 11/21/2022

## 2022-11-21 NOTE — ED Triage Notes (Signed)
Pt here via [facility] with chest tightness for a couple days. Pt has a left BBB, unsure if new dx. Hx DM, htn, and arthritis, and right knee replacement.     191/96 80 164-cbg

## 2022-11-21 NOTE — ED Provider Notes (Signed)
[facility] Provider Note    Event Date/Time   First MD Initiated Contact with Patient 11/21/22 1826     (approximate)   History   Chest Pain   HPI  Nina Perez is a 82 y.o. female here with intermittent chest pressure.  The patient states that for the last 2 days, she has had intermittent pressure-like chest discomfort.  This is radiated towards her right arms bilaterally.  She is felt very weak.  She denies any current pain or shortness of breath.  She told her family and they told her to come to the ER if she was having difficulty getting around the house today.  Denies known history of coronary disease.  As mentioned denies any current chest pain.  No recent illnesses.  No recent medication changes.  No leg swelling.     Physical Exam   Triage Vital Signs: ED Triage Vitals  Encounter Vitals Group     BP 11/21/22 1739 (!) 190/83     Systolic BP Percentile --      Diastolic BP Percentile --      Pulse Rate 11/21/22 1739 79     Resp 11/21/22 1739 18     Temp 11/21/22 1739 (!) 100.5 F (38.1 C)     Temp Source 11/21/22 1739 Oral     SpO2 11/21/22 1739 93 %     Weight 11/21/22 1740 210 lb 1.6 oz (95.3 kg)     Height 11/21/22 1740 5\' 6"  (1.676 m)     Head Circumference --      Peak Flow --      Pain Score 11/21/22 1740 0     Pain Loc --      Pain Education --      Exclude from Growth Chart --     Most recent vital signs: Vitals:   11/21/22 2215 11/21/22 2230  BP: 126/67 129/64  Pulse: 76 77  Resp:    Temp:    SpO2: 93% 92%     General: Awake, no distress.  CV:  Good peripheral perfusion.  2+ systolic murmur. Resp:  Normal work of breathing.  Lungs clear to auscultation bilaterally. Abd:  No distention.  No tenderness. Other:  No significant lower extremity edema.   ED Results / Procedures / Treatments   Labs (all labs ordered are listed, but only abnormal results are displayed) Labs Reviewed  BASIC METABOLIC PANEL - Abnormal;  Notable for the following components:      Result Value   Glucose, Bld 149 (*)    GFR, Estimated 59 (*)    All other components within normal limits  CBC - Abnormal; Notable for the following components:   RDW 19.5 (*)    Platelets 111 (*)    All other components within normal limits  CBG MONITORING, ED - Abnormal; Notable for the following components:   Glucose-Capillary 148 (*)    All other components within normal limits  TROPONIN I (HIGH SENSITIVITY) - Abnormal; Notable for the following components:   Troponin I (High Sensitivity) 13,770 (*)    All other components within normal limits  TROPONIN I (HIGH SENSITIVITY) - Abnormal; Notable for the following components:   Troponin I (High Sensitivity) 13,227 (*)    All other components within normal limits  SARS CORONAVIRUS 2 BY RT PCR  PROTIME-INR  APTT  CBC  HEPARIN LEVEL (UNFRACTIONATED)  LIPOPROTEIN A (LPA)  BASIC METABOLIC PANEL  LIPID PANEL     EKG  Normal sinus rhythm, trickle rate 71.  PR 186, QRS 152, QTc 490.  No acute ST elevations or depression.  LVH with intraventricular conduction delay.  Largely unchanged from prior.   RADIOLOGY Chest x-ray: Cardiomegaly, otherwise unremarkable   I also independently reviewed and agree with radiologist interpretations.   PROCEDURES:  Critical Care performed: Yes, see critical care procedure note(s)  .Critical Care  Performed by: Shaune Pollack, MD Authorized by: Shaune Pollack, MD   Critical care provider statement:    Critical care time (minutes):  30   Critical care time was exclusive of:  Separately billable procedures and treating other patients   Critical care was necessary to treat or prevent imminent or life-threatening deterioration of the following conditions:  Cardiac failure, circulatory failure and respiratory failure   Critical care was time spent personally by me on the following activities:  Development of treatment plan with patient or surrogate,  discussions with consultants, evaluation of patient's response to treatment, examination of patient, ordering and review of laboratory studies, ordering and review of radiographic studies, ordering and performing treatments and interventions, pulse oximetry, re-evaluation of patient's condition and review of old charts     MEDICATIONS ORDERED IN ED: Medications  heparin ADULT infusion 100 units/mL (25000 units/262mL) (1,000 Units/hr Intravenous New Bag/Given 11/21/22 1902)  nitroGLYCERIN 50 mg in dextrose 5 % 250 mL (0.2 mg/mL) infusion (40 mcg/min Intravenous Rate/Dose Change 11/21/22 2128)  allopurinol (ZYLOPRIM) tablet 100 mg (has no administration in time range)  clopidogrel (PLAVIX) tablet 75 mg (has no administration in time range)  folic acid (FOLVITE) tablet 1 mg (has no administration in time range)  pantoprazole (PROTONIX) EC tablet 40 mg (has no administration in time range)  torsemide (DEMADEX) tablet 10 mg (has no administration in time range)  losartan (COZAAR) tablet 100 mg (has no administration in time range)  metoprolol succinate (TOPROL-XL) 24 hr tablet 50 mg (has no administration in time range)  methotrexate (RHEUMATREX) tablet 10 mg (has no administration in time range)  atorvastatin (LIPITOR) tablet 80 mg (has no administration in time range)  loratadine (CLARITIN) tablet 10 mg (has no administration in time range)  aspirin chewable tablet 324 mg (0 mg Oral Hold 11/21/22 2235)    Or  aspirin suppository 300 mg ( Rectal See Alternative 11/21/22 2235)  nitroGLYCERIN (NITROSTAT) SL tablet 0.4 mg (has no administration in time range)  acetaminophen (TYLENOL) tablet 650 mg (has no administration in time range)  ondansetron (ZOFRAN) injection 4 mg (has no administration in time range)  0.9 %  sodium chloride infusion (has no administration in time range)  ALPRAZolam (XANAX) tablet 0.25 mg (has no administration in time range)  traZODone (DESYREL) tablet 25 mg (has no  administration in time range)  magnesium hydroxide (MILK OF MAGNESIA) suspension 30 mL (has no administration in time range)  heparin bolus via infusion 4,000 Units (4,000 Units Intravenous Bolus from Bag 11/21/22 1905)  metoprolol tartrate (LOPRESSOR) injection 5 mg (5 mg Intravenous Given 11/21/22 2000)     IMPRESSION / MDM / ASSESSMENT AND PLAN / ED COURSE  I reviewed the triage vital signs and the nursing notes.                              Differential diagnosis includes, but is not limited to, ACS, CHF, PE, dissection, anemia, arrhythmia  Patient's presentation is most consistent with acute presentation with potential threat to life or bodily function.  The  patient is on the cardiac monitor to evaluate for evidence of arrhythmia and/or significant heart rate changes  82 yo F with no known cardiac hx here with CP, weakness x 2 days. EKG here shows IVCD but no acute ST elevations. No CP currently. However, trop >13k and sx are concerning for ACS/NSTEMI. DDx includes myocarditis in setting of recent viral syndrome and low-grade temp, though this is much less likely. No hypoxia, no leg swelling, no signs of PE. Will start ASA, Heparin gtt. Discussed with Dr. Jones Broom of Cardiology, will give lopressor for goal HR<70, BP<150 and admit to medicine.   Patient continues to have no pain.  Blood pressure is improving on nitro.  Repeat EKG is unchanged.  Admit to medicine.  FINAL CLINICAL IMPRESSION(S) / ED DIAGNOSES   Final diagnoses:  NSTEMI (non-ST elevated myocardial infarction) (HCC)     Rx / DC Orders   ED Discharge Orders     None        Note:  This document was prepared using Dragon voice recognition software and may include unintentional dictation errors.   Shaune Pollack, MD 11/21/22 2236

## 2022-11-22 ENCOUNTER — Other Ambulatory Visit: Payer: Self-pay

## 2022-11-22 ENCOUNTER — Encounter: Payer: Self-pay | Admitting: Family Medicine

## 2022-11-22 DIAGNOSIS — I10 Essential (primary) hypertension: Secondary | ICD-10-CM | POA: Insufficient documentation

## 2022-11-22 DIAGNOSIS — E785 Hyperlipidemia, unspecified: Secondary | ICD-10-CM | POA: Insufficient documentation

## 2022-11-22 DIAGNOSIS — I214 Non-ST elevation (NSTEMI) myocardial infarction: Secondary | ICD-10-CM

## 2022-11-22 LAB — CBC
HCT: 33.5 % — ABNORMAL LOW (ref 36.0–46.0)
Hemoglobin: 10.4 g/dL — ABNORMAL LOW (ref 12.0–15.0)
MCH: 29.9 pg (ref 26.0–34.0)
MCHC: 31 g/dL (ref 30.0–36.0)
MCV: 96.3 fL (ref 80.0–100.0)
Platelets: 113 10*3/uL — ABNORMAL LOW (ref 150–400)
RBC: 3.48 MIL/uL — ABNORMAL LOW (ref 3.87–5.11)
RDW: 19.6 % — ABNORMAL HIGH (ref 11.5–15.5)
WBC: 4.5 10*3/uL (ref 4.0–10.5)
nRBC: 0 % (ref 0.0–0.2)

## 2022-11-22 LAB — BASIC METABOLIC PANEL
Anion gap: 6 (ref 5–15)
BUN: 20 mg/dL (ref 8–23)
CO2: 24 mmol/L (ref 22–32)
Calcium: 9.3 mg/dL (ref 8.9–10.3)
Chloride: 107 mmol/L (ref 98–111)
Creatinine, Ser: 0.81 mg/dL (ref 0.44–1.00)
GFR, Estimated: >60 mL/min (ref >60)
Glucose, Bld: 103 mg/dL — ABNORMAL HIGH (ref 70–99)
Potassium: 3.8 mmol/L (ref 3.5–5.1)
Sodium: 137 mmol/L (ref 135–145)

## 2022-11-22 LAB — GLUCOSE, CAPILLARY
Glucose-Capillary: 108 mg/dL — ABNORMAL HIGH (ref 70–99)
Glucose-Capillary: 121 mg/dL — ABNORMAL HIGH (ref 70–99)

## 2022-11-22 LAB — CBG MONITORING, ED
Glucose-Capillary: 141 mg/dL — ABNORMAL HIGH (ref 70–99)
Glucose-Capillary: 83 mg/dL (ref 70–99)
Glucose-Capillary: 84 mg/dL (ref 70–99)

## 2022-11-22 LAB — HEPARIN LEVEL (UNFRACTIONATED)
Heparin Unfractionated: 0.11 IU/mL — ABNORMAL LOW (ref 0.30–0.70)
Heparin Unfractionated: 0.25 IU/mL — ABNORMAL LOW (ref 0.30–0.70)
Heparin Unfractionated: 0.1 IU/mL — ABNORMAL LOW (ref 0.30–0.70)

## 2022-11-22 LAB — LIPID PANEL
Cholesterol: 83 mg/dL (ref 0–200)
HDL: 33 mg/dL — ABNORMAL LOW (ref >40)
LDL Cholesterol: 37 mg/dL (ref 0–99)
Total CHOL/HDL Ratio: 2.5 RATIO
Triglycerides: 66 mg/dL (ref <150)
VLDL: 13 mg/dL (ref 0–40)

## 2022-11-22 MED ORDER — HYDRALAZINE HCL 25 MG PO TABS
25.0000 mg | ORAL_TABLET | Freq: Four times a day (QID) | ORAL | Status: DC | PRN
  Administered 2022-11-22 – 2022-11-25 (×2): 25 mg via ORAL
  Filled 2022-11-22 (×2): qty 1

## 2022-11-22 MED ORDER — HEPARIN BOLUS VIA INFUSION
2400.0000 [IU] | Freq: Once | INTRAVENOUS | Status: AC
  Administered 2022-11-22: 2400 [IU] via INTRAVENOUS
  Filled 2022-11-22: qty 2400

## 2022-11-22 MED ORDER — METOPROLOL SUCCINATE ER 50 MG PO TB24
75.0000 mg | ORAL_TABLET | Freq: Every day | ORAL | Status: DC
  Administered 2022-11-23: 75 mg via ORAL
  Filled 2022-11-22: qty 1

## 2022-11-22 MED ORDER — HEPARIN BOLUS VIA INFUSION
1200.0000 [IU] | Freq: Once | INTRAVENOUS | Status: AC
  Administered 2022-11-22: 1200 [IU] via INTRAVENOUS
  Filled 2022-11-22: qty 1200

## 2022-11-22 NOTE — Progress Notes (Signed)
ANTICOAGULATION CONSULT NOTE  Pharmacy Consult for Heparin drip Indication: chest pain/ACS  Allergies  Allergen Reactions   Maxzide [Triamterene-Hctz] Other (See Comments)    Weakness/fatigue    Tape Other (See Comments)    Ob band aid adhesive  Pull skin off / Rash   Cephalexin Rash    Blotchiness to B lower legs after several days of cephalexin for UTI   Hydrochlorothiazide W-Triamterene     Other reaction(s): Other (See Comments) Extreme fatigue/weakness    Patient Measurements: Height: 5\' 6"  (167.6 cm) Weight: 95.3 kg (210 lb 1.6 oz) IBW/kg (Calculated) : 59.3 Heparin Dosing Weight: 80.5 kg  Vital Signs: Temp: 99 F (37.2 C) (07/13 0057) Temp Source: Oral (07/13 0057) BP: 154/72 (07/13 0330) Pulse Rate: 77 (07/13 0330)  Labs: Recent Labs    11/21/22 1741 11/21/22 1906 11/21/22 2005 11/22/22 0322  HGB 12.0  --   --   --   HCT 39.1  --   --   --   PLT 111*  --   --   --   APTT  --  34  --   --   LABPROT  --  14.5  --   --   INR  --  1.1  --   --   HEPARINUNFRC  --   --   --  0.11*  CREATININE 0.97  --   --   --   TROPONINIHS 13,770*  --  13,227*  --     Estimated Creatinine Clearance: 52.9 mL/min (by C-G formula based on SCr of 0.97 mg/dL).   Medical History: 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HCC)    a.) 2/2 scleroderma Dx   LBBB (left bundle branch block)    Long term current use of antithrombotics/antiplatelets    a.) ASA + clopidogrel   Long term current use of immunosuppressive drug    a.) MTX for RA and scleroderma Dx   OSA on CPAP    Osteoarthritis    a.)  knees, spine   PAD (peripheral artery disease) (HCC)    Phlebitis    a.) x 2 (with pregnancy)   Pulmonary fibrosis (HCC)    a.) mild   Pulmonary hypertension (HCC) 02/24/2012   a.) TTE 02/24/2012: EF 55%, RVSP 33; b.) TTE 10/12/2014: EF >55%, RVSP 36.1; c.) TTE 09/02/2017: EF 55%, RVSP 45.5   Reflux esophagitis    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 b.) renal US 05/23/2020: solid LEFT renal mass measuring 1.4 x 1.5 x 1.6. 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, sclerodactyly, telangiectasias   Skin cancer 2018   Supplemental oxygen dependent    a.) 2L/Ricardo PRN for DOE related to mild pulmonary fibrosis and pulmonary HTN  T2DM (type 2 diabetes mellitus) (HCC)     Medications:  (Not in a hospital admission)  Scheduled:   allopurinol  100 mg Oral Daily   aspirin  324 mg Oral NOW   Or   aspirin  300 mg Rectal NOW   atorvastatin  80 mg Oral Daily   clopidogrel  75 mg Oral Q breakfast   folic acid  1 mg Oral QPM   insulin aspart  0-5 Units Subcutaneous QHS   insulin aspart  0-9 Units Subcutaneous TID WC   losartan  100 mg Oral Daily   [START ON 11/26/2022] methotrexate  10 mg Oral Q Wed   metoprolol succinate  50 mg Oral Daily   pantoprazole  40 mg Oral Daily   Infusions:   sodium chloride 100 mL/hr at 11/22/22 0004   heparin 1,000 Units/hr (11/22/22 0332)   nitroGLYCERIN 40 mcg/min (11/22/22 0333)    Assessment: 82 yo F to start Heparin drip for ACS/STEMi, elevated troponin On ASA/plavix PTA per med rec Hgb 12.0   Plt 111  INR 1.1  aPTT 34  Goal of Therapy:  Heparin level 0.3-0.7 units/ml Monitor platelets by anticoagulation protocol: Yes   07/13 0322 HL 0.11,  subtherapeutic  Plan:  Bolus 2400 units x 1 Increase heparin infusion to 1250 units/hr Recheck HL in 8 hrs after rate change CBC daily while on heparin  Otelia Sergeant, PharmD, [facility] 11/22/2022 4:11 AM

## 2022-11-22 NOTE — Progress Notes (Signed)
ANTICOAGULATION CONSULT NOTE  Pharmacy Consult for Heparin drip Indication: chest pain/ACS  Allergies  Allergen Reactions   Maxzide [Triamterene-Hctz] Other (See Comments)    Weakness/fatigue    Tape Other (See Comments)    Ob band aid adhesive  Pull skin off / Rash   Cephalexin Rash    Blotchiness to B lower legs after several days of cephalexin for UTI   Hydrochlorothiazide W-Triamterene     Other reaction(s): Other (See Comments) Extreme fatigue/weakness    Patient Measurements: Height: 5\' 6"  (167.6 cm) Weight: 95.3 kg (210 lb 1.6 oz) IBW/kg (Calculated) : 59.3 Heparin Dosing Weight: 80.5 kg  Vital Signs: Temp: 99.1 F (37.3 C) (07/13 1330) Temp Source: Oral (07/13 1330) BP: 138/68 (07/13 1300) Pulse Rate: 79 (07/13 1300)  Labs: Recent Labs    11/21/22 1741 11/21/22 1906 11/21/22 2005 11/22/22 0322 11/22/22 0454 11/22/22 1318  HGB 12.0  --   --   --  10.4*  --   HCT 39.1  --   --   --  33.5*  --   PLT 111*  --   --   --  113*  --   APTT  --  34  --   --   --   --   LABPROT  --  14.5  --   --   --   --   INR  --  1.1  --   --   --   --   HEPARINUNFRC  --   --   --  0.11*  --  0.25*  CREATININE 0.97  --   --   --  0.81  --   TROPONINIHS 13,770*  --  13,227*  --   --   --     Estimated Creatinine Clearance: 63.4 mL/min (by C-G formula based on SCr of 0.81 mg/dL).   Medical History: 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HCC)    a.) 2/2 scleroderma Dx   LBBB (left bundle branch block)    Long term current use of antithrombotics/antiplatelets    a.) ASA + clopidogrel   Long  term current use of immunosuppressive drug    a.) MTX for RA and scleroderma Dx   OSA on CPAP    Osteoarthritis    a.) knees, spine   PAD (peripheral artery disease) (HCC)    Phlebitis    a.) x 2 (with pregnancy)   Pulmonary fibrosis (HCC)    a.) mild   Pulmonary hypertension (HCC) 02/24/2012   a.) TTE 02/24/2012: EF 55%, RVSP 33; b.) TTE 10/12/2014: EF >55%, RVSP 36.1; c.) TTE 09/02/2017: EF 55%, RVSP 45.5   Reflux esophagitis    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 b.) renal US 05/23/2020: solid LEFT renal mass measuring 1.4 x 1.5 x 1.6. 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, sclerodactyly, telangiectasias   Skin cancer 2018   Supplemental oxygen dependent    a.) 2L/ PRN for DOE related to mild pulmonary fibrosis and pulmonary HTN   T2DM (type 2 diabetes mellitus) (HCC)     Medications:  (Not in a hospital admission)  Scheduled:   allopurinol  100 mg Oral Daily   aspirin  324 mg Oral NOW   Or   aspirin  300 mg Rectal NOW   atorvastatin  80 mg Oral Daily   clopidogrel  75 mg Oral Q breakfast   folic acid  1 mg Oral QPM   insulin aspart  0-5 Units Subcutaneous QHS   insulin aspart  0-9 Units Subcutaneous TID WC   losartan  100 mg Oral Daily   [START ON 11/26/2022] methotrexate  10 mg Oral Q Wed   metoprolol succinate  50 mg Oral Daily   pantoprazole  40 mg Oral Daily   Infusions:   sodium chloride 50 mL/hr at 11/22/22 0741   heparin 1,250 Units/hr (11/22/22 1239)   nitroGLYCERIN 65 mcg/min (11/22/22 1058)    Assessment: 82 yo F to start Heparin drip for ACS/STEMi, elevated troponin On ASA/plavix PTA per med rec Hgb 12.0   Plt 111  INR 1.1  aPTT 34  7/13 0322 HL  0.11 Subtherapeutic-  inc to 1250 u/hr 7/13 1318  HL 0.25 Subtherapeutic  1250> 1400 u/hr  Goal of Therapy:  Heparin level 0.3-0.7 units/ml Monitor platelets by anticoagulation protocol: Yes   07/13 0322 HL 0.11, subtherapeutic  Plan: 7/13 1318  HL= 0.25 Subtherapeutic   Bolus 1200 units x 1 Increase heparin infusion to 1400 units/hr Recheck HL in 8 hrs after rate change CBC daily while on heparin Considering Cath for Monday 7/15  Bari Mantis PharmD Clinical Pharmacist 11/22/2022

## 2022-11-22 NOTE — Assessment & Plan Note (Addendum)
-   The patient will be admitted to a progressive unit bed. - We will continue IV heparin drip. - She will be placed on as needed sublingual nitroglycerin and IV morphine sulfate for pain. - We will continue IV nitro drip. - She will be placed on high-dose statin therapy as well as aspirin.  Will continue Plavix. - Beta-blocker therapy will be continued. - 2D echo be obtained. - Cardiology consult will be obtained. - I notified [facility] group about the patient.

## 2022-11-22 NOTE — Consult Note (Signed)
Nina Perez is a 82 y.o. female  [id number]  Primary Cardiologist: [facility] cardiology Reason for Consultation: Non-STEMI  HPI: This 82 year old white female with a past medical history of stage III chronic kidney disease left bundle branch block obstructive sleep apnea peripheral vascular disease hypertension and hyperlipidemia presented to the hospital with chest tightness which started 3 days ago   Review of Systems: No orthopnea/leg swelling   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HCC)    a.) 2/2 scleroderma Dx   LBBB (left bundle branch block)    Long term current use of antithrombotics/antiplatelets    a.) ASA + clopidogrel   Long term current use of immunosuppressive drug    a.) MTX for RA and scleroderma Dx   OSA on CPAP    Osteoarthritis    a.) knees, spine   PAD (peripheral artery disease) (HCC)    Phlebitis    a.) x 2 (with pregnancy)   Pulmonary fibrosis (HCC)    a.) mild   Pulmonary hypertension (HCC) 02/24/2012   a.) TTE 02/24/2012: EF 55%, RVSP 33; b.) TTE 10/12/2014: EF >55%, RVSP 36.1; c.) TTE 09/02/2017: EF 55%, RVSP 45.5   Reflux esophagitis    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 b.) renal US 05/23/2020: solid LEFT renal mass measuring 1.4 x 1.5 x 1.6. 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, sclerodactyly, telangiectasias   Skin cancer 2018   Supplemental oxygen dependent    a.) 2L/[place] PRN for DOE related to mild pulmonary fibrosis and pulmonary HTN   T2DM (type 2 diabetes mellitus) (HCC)     (Not in a hospital admission)     allopurinol  100 mg Oral Daily   aspirin  324 mg Oral NOW   Or   aspirin  300 mg Rectal NOW   atorvastatin  80 mg Oral Daily   clopidogrel  75 mg Oral Q breakfast   folic acid  1 mg Oral QPM   insulin aspart  0-5 Units Subcutaneous QHS   insulin aspart  0-9 Units Subcutaneous TID WC   losartan  100 mg Oral Daily   [START ON 11/26/2022] methotrexate  10 mg Oral Q Wed   metoprolol succinate  50 mg Oral Daily   pantoprazole  40 mg Oral Daily    Infusions:  sodium chloride 50 mL/hr at 11/22/22 0741   heparin 1,250 Units/hr (11/22/22 1239)   nitroGLYCERIN 65 mcg/min (11/22/22 1058)    Allergies  Allergen Reactions   Maxzide [Triamterene-Hctz] Other (See Comments)    Weakness/fatigue    Tape Other (See Comments)    Ob band aid adhesive  Pull skin off / Rash   Cephalexin Rash    Blotchiness to  B lower legs after several days of cephalexin for UTI   Hydrochlorothiazide W-Triamterene     Other reaction(s): Other (See Comments) Extreme fatigue/weakness    Social History   Socioeconomic History   Marital status: Married    Spouse name: [name]   Number of children: 2   Years of education: Not on file   Highest education level: Not on file  Occupational History   Not on file  Tobacco Use   Smoking status: Former    Current packs/day: 0.00    Average packs/day: 0.5 packs/day for 15.0 years (7.5 ttl pk-yrs)    Types: Cigarettes    Start date: 05/12/1973    Quit date: 05/12/1988    Years since quitting: 34.5   Smokeless tobacco: Never  Vaping Use   Vaping status: Never Used  Substance and Sexual Activity   Alcohol use: No    Alcohol/week: 0.0 standard drinks of alcohol   Drug use: No   Sexual activity: Yes  Other Topics  Concern   Not on file  Social History Narrative   Lives at home with spouse [name]    Social Determinants of Health   Financial Resource Strain: Low Risk  (11/20/2021)   Overall Financial Resource Strain (CARDIA)    Difficulty of Paying Living Expenses: Not hard at all  Food Insecurity: No Food Insecurity (11/20/2021)   Hunger Vital Sign    Worried About Running Out of Food in the Last Year: Never true    Ran Out of Food in the Last Year: Never true  Transportation Needs: No Transportation Needs (11/20/2021)   PRAPARE - Administrator, Civil Service (Medical): No    Lack of Transportation (Non-Medical): No  Physical Activity: Sufficiently Active (11/20/2021)   Exercise Vital Sign    Days of Exercise per Week: 3 days    Minutes of Exercise per Session: 60 min  Stress: No Stress Concern Present (11/20/2021)   [employer] of Occupational Health - Occupational Stress Questionnaire    Feeling of Stress : Not at all  Social Connections: Socially Integrated (11/20/2021)   Social Connection and Isolation Panel [NHANES]    Frequency of Communication with Friends and Family: More than three times a week    Frequency of Social Gatherings with Friends and Family: Not on file    Attends Religious Services: More than 4 times per year    Active Member of [employer] or Organizations: Yes    Attends Banker Meetings: More than 4 times per year    Marital Status: Married  Catering manager Violence: Not At Risk (11/20/2021)   Humiliation, Afraid, Rape, and Kick questionnaire    Fear of Current or Ex-Partner: No    Emotionally Abused: No    Physically Abused: No    Sexually Abused: No    Family History  Problem Relation Age of Onset   Arthritis Mother    Heart attack Father    Cancer Sister    Cancer - Prostate Brother    Colon cancer Neg Hx    Breast cancer Neg Hx     PHYSICAL EXAM: Vitals:   11/22/22 1200 11/22/22 1300  BP: 135/66 138/68  Pulse: 80 79  Resp:  (!) 24 (!) 24  Temp:    SpO2: 91% 91%     Intake/Output Summary (Last 24 hours) at 11/22/2022 1327 Last data filed at 11/22/2022 1239 Gross per 24 hour  Intake 375.24 ml  Output 450 ml  Net -74.76 ml  General:  Well appearing. No respiratory difficulty HEENT: normal Neck: supple. no JVD. Carotids 2+ bilat; no bruits. No lymphadenopathy or thryomegaly appreciated. Cor: PMI nondisplaced. Regular rate & rhythm. No rubs, gallops or murmurs. Lungs: clear Abdomen: soft, nontender, nondistended. No hepatosplenomegaly. No bruits or masses. Good bowel sounds. Extremities: no cyanosis, clubbing, rash, edema Neuro: alert & oriented x 3, cranial nerves grossly intact. moves all 4 extremities w/o difficulty. Affect pleasant.  ECG: NSR LBBB  Results for orders placed or performed during the hospital encounter of 11/21/22 (from the past 24 hour(s))  Basic metabolic panel     Status: Abnormal   Collection Time: 11/21/22  5:41 PM  Result Value Ref Range   Sodium 135 135 - 145 mmol/L   Potassium 4.2 3.5 - 5.1 mmol/L   Chloride 104 98 - 111 mmol/L   CO2 22 22 - 32 mmol/L   Glucose, Bld 149 (H) 70 - 99 mg/dL   BUN 21 8 - 23 mg/dL   Creatinine, Ser 1.61 0.44 - 1.00 mg/dL   Calcium 9.7 8.9 - 09.6 mg/dL   GFR, Estimated 59 (L) >60 mL/min   Anion gap 9 5 - 15  CBC     Status: Abnormal   Collection Time: 11/21/22  5:41 PM  Result Value Ref Range   WBC 5.3 4.0 - 10.5 K/uL   RBC 4.00 3.87 - 5.11 MIL/uL   Hemoglobin 12.0 12.0 - 15.0 g/dL   HCT 04.5 40.9 - 81.1 %   MCV 97.8 80.0 - 100.0 fL   MCH 30.0 26.0 - 34.0 pg   MCHC 30.7 30.0 - 36.0 g/dL   RDW 91.4 (H) 78.2 - 95.6 %   Platelets 111 (L) 150 - 400 K/uL   nRBC 0.0 0.0 - 0.2 %  Troponin I (High Sensitivity)     Status: Abnormal   Collection Time: 11/21/22  5:41 PM  Result Value Ref Range   Troponin I (High Sensitivity) 13,770 (HH) <18 ng/L  CBG monitoring, ED     Status: Abnormal   Collection Time: 11/21/22  5:46 PM  Result Value Ref  Range   Glucose-Capillary 148 (H) 70 - 99 mg/dL  Protime-INR     Status: None   Collection Time: 11/21/22  7:06 PM  Result Value Ref Range   Prothrombin Time 14.5 11.4 - 15.2 seconds   INR 1.1 0.8 - 1.2  APTT     Status: None   Collection Time: 11/21/22  7:06 PM  Result Value Ref Range   aPTT 34 24 - 36 seconds  Troponin I (High Sensitivity)     Status: Abnormal   Collection Time: 11/21/22  8:05 PM  Result Value Ref Range   Troponin I (High Sensitivity) 13,227 (HH) <18 ng/L  SARS Coronavirus 2 by RT PCR (hospital order, performed in [facility] hospital lab) *cepheid single result test* Anterior Nasal Swab     Status: None   Collection Time: 11/21/22  8:05 PM   Specimen: Anterior Nasal Swab  Result Value Ref Range   SARS Coronavirus 2 by RT PCR NEGATIVE NEGATIVE  CBG monitoring, ED     Status: Abnormal   Collection Time: 11/21/22 10:56 PM  Result Value Ref Range   Glucose-Capillary 168 (H) 70 - 99 mg/dL  CBG monitoring, ED     Status: Abnormal   Collection Time: 11/22/22  1:05 AM  Result Value Ref Range   Glucose-Capillary 141 (H) 70 - 99 mg/dL  Heparin level (unfractionated)  Status: Abnormal   Collection Time: 11/22/22  3:22 AM  Result Value Ref Range   Heparin Unfractionated 0.11 (L) 0.30 - 0.70 IU/mL  Lipid panel     Status: Abnormal   Collection Time: 11/22/22  4:54 AM  Result Value Ref Range   Cholesterol 83 0 - 200 mg/dL   Triglycerides 66 <696 mg/dL   HDL 33 (L) >29 mg/dL   Total CHOL/HDL Ratio 2.5 RATIO   VLDL 13 0 - 40 mg/dL   LDL Cholesterol 37 0 - 99 mg/dL  Basic metabolic panel     Status: Abnormal   Collection Time: 11/22/22  4:54 AM  Result Value Ref Range   Sodium 137 135 - 145 mmol/L   Potassium 3.8 3.5 - 5.1 mmol/L   Chloride 107 98 - 111 mmol/L   CO2 24 22 - 32 mmol/L   Glucose, Bld 103 (H) 70 - 99 mg/dL   BUN 20 8 - 23 mg/dL   Creatinine, Ser 5.28 0.44 - 1.00 mg/dL   Calcium 9.3 8.9 - 41.3 mg/dL   GFR, Estimated >24 >40 mL/min   Anion gap 6  5 - 15  CBC     Status: Abnormal   Collection Time: 11/22/22  4:54 AM  Result Value Ref Range   WBC 4.5 4.0 - 10.5 K/uL   RBC 3.48 (L) 3.87 - 5.11 MIL/uL   Hemoglobin 10.4 (L) 12.0 - 15.0 g/dL   HCT 10.2 (L) 72.5 - 36.6 %   MCV 96.3 80.0 - 100.0 fL   MCH 29.9 26.0 - 34.0 pg   MCHC 31.0 30.0 - 36.0 g/dL   RDW 44.0 (H) 34.7 - 42.5 %   Platelets 113 (L) 150 - 400 K/uL   nRBC 0.0 0.0 - 0.2 %  CBG monitoring, ED     Status: None   Collection Time: 11/22/22  7:43 AM  Result Value Ref Range   Glucose-Capillary 83 70 - 99 mg/dL  CBG monitoring, ED     Status: None   Collection Time: 11/22/22 11:07 AM  Result Value Ref Range   Glucose-Capillary 84 70 - 99 mg/dL   DG Chest 2 View  Result Date: 11/21/2022 CLINICAL DATA:  Chest tightness EXAM: CHEST - 2 VIEW COMPARISON:  08/30/2020, 06/17/2018 FINDINGS: Prominent right hilar vascular shadow. Cardiomegaly. No acute airspace disease, pleural effusion or pneumothorax IMPRESSION: Cardiomegaly. Electronically Signed   By: Jasmine Pang M.D.   On: 11/21/2022 18:13     ASSESSMENT AND PLAN: NSTEMI, with troponins over 13000, and symptoms starting 3 days ago. She probably completed MI. But still has tightness, thus continue heparin and consider cath Monday 7:30  Danette Weinfeld [place] Flakes

## 2022-11-22 NOTE — Assessment & Plan Note (Signed)
-   We will continue to her antihypertensive therapy.

## 2022-11-22 NOTE — Progress Notes (Signed)
[facility]  - [place] [facility]   PATIENT NAME: Nina Perez    MR#:  [id number]  DATE OF BIRTH:  [birth year]  SUBJECTIVE:  patient seen earlier. Daughter at bedside. Came in with autographs chest pain since past Thursday. Gotten worse came in and was ruled in for non-STEMI. Troponin was 13,000. Patient denies chest pain during my evaluation currently on heparin and nitroglycerin drip.    VITALS:  Blood pressure 138/68, pulse 79, temperature 99.1 F (37.3 C), temperature source Oral, resp. rate (!) 24, height 5\' 6"  (1.676 m), weight 95.3 kg, last menstrual period 05/25/1988, SpO2 91%.  PHYSICAL EXAMINATION:   GENERAL:  82 y.o.-year-old patient with no acute distress.  LUNGS: Normal breath sounds bilaterally, no wheezing CARDIOVASCULAR: S1, S2 normal. No murmur   ABDOMEN: Soft, nontender, nondistended. Bowel sounds present.  EXTREMITIES: No  edema b/l.    NEUROLOGIC: nonfocal  patient is alert and awake SKIN: No obvious rash, lesion, or ulcer.   LABORATORY PANEL:  CBC Recent Labs  Lab 11/22/22 0454  WBC 4.5  HGB 10.4*  HCT 33.5*  PLT 113*    Chemistries  Recent Labs  Lab 11/22/22 0454  NA 137  K 3.8  CL 107  CO2 24  GLUCOSE 103*  BUN 20  CREATININE 0.81  CALCIUM 9.3   Cardiac Enzymes No results for input(s): "TROPONINI" in the last 168 hours. RADIOLOGY:  DG Chest 2 View  Result Date: 11/21/2022 CLINICAL DATA:  Chest tightness EXAM: CHEST - 2 VIEW COMPARISON:  08/30/2020, 06/17/2018 FINDINGS: Prominent right hilar vascular shadow. Cardiomegaly. No acute airspace disease, pleural effusion or pneumothorax IMPRESSION: Cardiomegaly. Electronically Signed   By: Jasmine Pang M.D.   On: 11/21/2022 18:13    Assessment and Plan  Nina Perez is a 82 y.o. Caucasian female with medical history significant for stage III chronic kidney disease, GERD, with hiatal hernia, hypertension, dyslipidemia, ILD, left bundle branch block, PAD, RA, and type 2  diabetes mellitus, who presented to the emergency room with acute onset of left parasternal chest tightness graded 5/10 in severity with associated palpitations and radiation to both arms.    High sensitive troponin I was 13,770 and later on 13,227   EKG showed normal sinus rhythm with a rate of 79 with left bundle branch block.   NSTEMI (non-ST elevated myocardial infarction) (HCC) - continue IV heparin drip. - as needed sublingual nitroglycerin and IV morphine sulfate for pain. -  continue IV nitro drip--titrate for chest pain and BP - She will be placed on high-dose statin therapy as well as aspirin.   --Will continue Plavix. - Beta-blocker therapy will be continued. - 2D echo be obtained. - Cardiology consult with [facility]--seen by Dr Melynda Ripple on Monday. Pt and dter aware   Essential hypertension - cont Losartan, Toprol XL --prn hydralazine   Dyslipidemia - continue statin therapy   Type 2 diabetes mellitus without complications (HCC) - on supplemental coverage with NovoLog. - will hold off metformin.   Gout - continue allopurinol.       DVT prophylaxis: IV heparin Advanced Care Planning:  Code Status: full code. Family communication :dter in ER Level of care: Progressive Status is: Inpatient Remains inpatient appropriate because: non-STEMI. Plans for cardiac cath on Monday    TOTAL TIME TAKING CARE OF THIS PATIENT: 35 minutes.  >50% time spent on counselling and coordination of care  Note: This dictation was prepared with Dragon dictation along with smaller phrase technology. Any transcriptional errors that  result from this process are unintentional.  Enedina Finner M.D    [facility]   CC: Primary care physician; Dale Vineland, MD

## 2022-11-22 NOTE — Assessment & Plan Note (Signed)
-   We will continue allopurinol 

## 2022-11-22 NOTE — Assessment & Plan Note (Signed)
-   The patient will be placed on supplemental coverage with NovoLog. - We will hold off metformin. 

## 2022-11-22 NOTE — ED Notes (Signed)
Sent secure message to Valente David MD to inform 12 lead EKG has been completed (pt is being admitted)

## 2022-11-22 NOTE — Progress Notes (Signed)
ANTICOAGULATION CONSULT NOTE  Pharmacy Consult for Heparin drip Indication: chest pain/ACS  Allergies  Allergen Reactions   Maxzide [Triamterene-Hctz] Other (See Comments)    Weakness/fatigue    Tape Other (See Comments)    Ob band aid adhesive  Pull skin off / Rash   Cephalexin Rash    Blotchiness to B lower legs after several days of cephalexin for UTI   Hydrochlorothiazide W-Triamterene     Other reaction(s): Other (See Comments) Extreme fatigue/weakness    Patient Measurements: Height: 5\' 6"  (167.6 cm) Weight: 95.3 kg (210 lb 1.6 oz) IBW/kg (Calculated) : 59.3 Heparin Dosing Weight: 80.5 kg  Vital Signs: Temp: 99.4 F (37.4 C) (07/13 1919) Temp Source: Oral (07/13 1730) BP: 126/64 (07/13 1919) Pulse Rate: 82 (07/13 1919)  Labs: Recent Labs    11/21/22 1741 11/21/22 1906 11/21/22 2005 11/22/22 0322 11/22/22 0454 11/22/22 1318 11/22/22 2152  HGB 12.0  --   --   --  10.4*  --   --   HCT 39.1  --   --   --  33.5*  --   --   PLT 111*  --   --   --  113*  --   --   APTT  --  34  --   --   --   --   --   LABPROT  --  14.5  --   --   --   --   --   INR  --  1.1  --   --   --   --   --   HEPARINUNFRC  --   --   --  0.11*  --  0.25* 0.10*  CREATININE 0.97  --   --   --  0.81  --   --   TROPONINIHS 13,770*  --  13,227*  --   --   --   --     Estimated Creatinine Clearance: 63.4 mL/min (by C-G formula based on SCr of 0.81 mg/dL).   Medical History: 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HCC)    a.) 2/2 scleroderma Dx   LBBB (left bundle branch block)    Long term current use of  antithrombotics/antiplatelets    a.) ASA + clopidogrel   Long term current use of immunosuppressive drug    a.) MTX for RA and scleroderma Dx   OSA on CPAP    Osteoarthritis    a.) knees, spine   PAD (peripheral artery disease) (HCC)    Phlebitis    a.) x 2 (with pregnancy)   Pulmonary fibrosis (HCC)    a.) mild   Pulmonary hypertension (HCC) 02/24/2012   a.) TTE 02/24/2012: EF 55%, RVSP 33; b.) TTE 10/12/2014: EF >55%, RVSP 36.1; c.) TTE 09/02/2017: EF 55%, RVSP 45.5   Reflux esophagitis    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 b.) renal US 05/23/2020: solid LEFT renal mass measuring 1.4 x 1.5 x 1.6. 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   unk at unk   allopurinol (ZYLOPRIM) 100 MG tablet Take 1 tablet (100 mg total) by mouth daily. 90 tablet 1 11/21/2022   aspirin 81 MG chewable tablet Chew 2 tablets (162 mg total) by mouth daily. 90 tablet 0 11/21/2022   atorvastatin (LIPITOR) 40 MG tablet Take 1 tablet by mouth once daily 90 tablet 2 11/21/2022   clopidogrel (PLAVIX) 75 MG tablet Take 1 tablet by mouth once daily with breakfast 90 tablet 0 11/21/2022   fexofenadine (ALLEGRA) 180 MG tablet Take 1 tablet (180 mg total) by mouth daily. (Patient taking differently: Take 180 mg by mouth daily as  needed for allergies.) 30 tablet 1 unk at unk   folic acid (FOLVITE) 1 MG tablet Take 1 mg by mouth every evening.    Past Week   glucose blood (BAYER CONTOUR TEST) test strip USE  STRIP TO CHECK GLUCOSE TWICE DAILY. Dx: E11.9 100 each 12 unk at unk   ipratropium (ATROVENT) 0.06 % nasal spray Place 2 sprays into both nostrils 4 (four) times daily. (Patient taking differently: Place 2 sprays into both nostrils 4 (four) times daily as needed for rhinitis.) 15 mL 12 unk at unk   losartan (COZAAR) 100 MG tablet Take 1 tablet (100 mg total) by mouth daily. 90 tablet 0 11/21/2022   metFORMIN (GLUCOPHAGE) 500 MG tablet Take 1 tablet by mouth once daily 90 tablet 2 11/21/2022   methotrexate (RHEUMATREX) 2.5 MG tablet Take 10 mg by mouth every Wednesday.   Past Week   metoprolol succinate (TOPROL-XL) 50 MG 24 hr tablet Take 1 tablet (50 mg total) by mouth daily. 90 tablet 0 11/21/2022   MICROLET LANCETS MISC Check sugar once daily, [employer] Microlet Lancets. Dx E11.9 100 each 12 un at unk   pantoprazole (PROTONIX) 40 MG tablet Take 1 tablet by mouth once daily 90 tablet 3 11/21/2022   torsemide (DEMADEX) 20 MG tablet Take 20 mg by mouth as needed. Taking 1/2 tablet just as needed   unk at unk   celecoxib (CELEBREX) 200 MG capsule Take 1 capsule (200 mg total) by mouth 2 (two) times daily. (Patient not taking: Reported on 11/21/2022) 90 capsule 0 Not Taking   magnesium oxide (MAG-OX) 400 (240 Mg) MG tablet Take 1 tablet (400 mg total) by mouth 2 (two) times daily. (Patient not taking: Reported on 11/21/2022) 60 tablet 2 Not Taking   mupirocin ointment (BACTROBAN) 2 % Apply 1 Application topically 2 (two) times daily. (Patient not taking: Reported on 11/21/2022) 22 g 0 Not Taking   traMADol (ULTRAM) 50 MG tablet Take 1 tablet (50 mg total) by mouth every 4 (four) hours as needed for moderate pain. (Patient not taking: Reported on 11/21/2022) 30 tablet 0 Not Taking   triamcinolone cream (KENALOG) 0.1 % Apply  1  application topically 2 (two) times daily. To lower legs before putting on and/or after removing compression hose for redness in legs (Patient not taking: Reported on 11/21/2022) 45 g 0 Not Taking   trospium (SANCTURA) 20 MG tablet Take 1 tablet (20 mg total) by mouth at bedtime. (Patient not taking: Reported on 11/21/2022) 30 tablet 0 Not Taking   Scheduled:   allopurinol  100 mg Oral Daily   aspirin  324 mg Oral NOW   Or   aspirin  300 mg Rectal NOW   atorvastatin  80 mg Oral Daily   clopidogrel  75 mg Oral Q breakfast   folic acid  1 mg Oral QPM   insulin aspart  0-5 Units Subcutaneous QHS   insulin aspart  0-9 Units Subcutaneous TID WC   losartan  100 mg Oral Daily   [START ON 11/26/2022] methotrexate  10 mg Oral Q Wed   [START ON 11/23/2022] metoprolol succinate  75 mg Oral Daily   pantoprazole  40 mg Oral Daily   Infusions:   sodium chloride 50 mL/hr at 11/22/22 0741   heparin 1,400 Units/hr (11/22/22 1428)   nitroGLYCERIN 60 mcg/min (11/22/22 1448)    Assessment: 82 yo F to start Heparin drip for ACS/STEMi, elevated troponin On ASA/plavix PTA per med rec Hgb 12.0   Plt 111  INR 1.1  aPTT 34  7/13 0322 HL 0.11 Subtherapeutic-  inc to 1250 u/hr 7/13 1318  HL 0.25 Subtherapeutic  1250> 1400 u/hr  Goal of Therapy:  Heparin level 0.3-0.7 units/ml Monitor platelets by anticoagulation protocol: Yes   07/13 0322 HL 0.11, subtherapeutic 7/13 1318  HL= 0.25 Subtherapeutic 07/13 2152 HL 0.10, subtherapeutic  Plan: Insurance claims handler.  No stoppages or line issues Bolus 2400 units x 1 Increase heparin infusion to 1650 units/hr Recheck HL in w/ AM labs after rate change CBC daily while on heparin Considering Cath for Monday 7/15  Otelia Sergeant, PharmD, [facility] 11/22/2022 10:33 PM

## 2022-11-22 NOTE — ED Notes (Signed)
Patient eating breakfast tray and given cup of water as requested.

## 2022-11-22 NOTE — Assessment & Plan Note (Signed)
-   We will continue statin therapy as mentioned above.

## 2022-11-23 ENCOUNTER — Inpatient Hospital Stay
Admit: 2022-11-23 | Discharge: 2022-11-23 | Disposition: A | Discharge status: Home / Self Care | Payer: Medicare Other | Attending: Family Medicine | Admitting: Family Medicine

## 2022-11-23 DIAGNOSIS — R079 Chest pain, unspecified: Secondary | ICD-10-CM

## 2022-11-23 DIAGNOSIS — I214 Non-ST elevation (NSTEMI) myocardial infarction: Principal | ICD-10-CM

## 2022-11-23 LAB — ECHOCARDIOGRAM COMPLETE
AR max vel: 1.34 cm2
AV Area VTI: 1.21 cm2
AV Area mean vel: 1.31 cm2
AV Mean grad: 11.5 mmHg
AV Peak grad: 19.3 mmHg
Ao pk vel: 2.2 m/s
Area-P 1/2: 5.79 cm2
Calc EF: 55.7 %
Height: 66 in
S' Lateral: 3.9 cm
Single Plane A2C EF: 60.8 %
Single Plane A4C EF: 52.1 %
Weight: 3361.57 oz

## 2022-11-23 LAB — CBC
HCT: 32.8 % — ABNORMAL LOW (ref 36.0–46.0)
Hemoglobin: 10.2 g/dL — ABNORMAL LOW (ref 12.0–15.0)
MCH: 30.3 pg (ref 26.0–34.0)
MCHC: 31.1 g/dL (ref 30.0–36.0)
MCV: 97.3 fL (ref 80.0–100.0)
Platelets: 120 10*3/uL — ABNORMAL LOW (ref 150–400)
RBC: 3.37 MIL/uL — ABNORMAL LOW (ref 3.87–5.11)
RDW: 19.4 % — ABNORMAL HIGH (ref 11.5–15.5)
WBC: 4.2 10*3/uL (ref 4.0–10.5)
nRBC: 0 % (ref 0.0–0.2)

## 2022-11-23 LAB — HEPARIN LEVEL (UNFRACTIONATED)
Heparin Unfractionated: 0.42 IU/mL (ref 0.30–0.70)
Heparin Unfractionated: 0.39 IU/mL (ref 0.30–0.70)

## 2022-11-23 LAB — GLUCOSE, CAPILLARY
Glucose-Capillary: 83 mg/dL (ref 70–99)
Glucose-Capillary: 147 mg/dL — ABNORMAL HIGH (ref 70–99)
Glucose-Capillary: 99 mg/dL (ref 70–99)
Glucose-Capillary: 104 mg/dL — ABNORMAL HIGH (ref 70–99)

## 2022-11-23 MED ORDER — PERFLUTREN LIPID MICROSPHERE
1.0000 mL | INTRAVENOUS | Status: AC | PRN
  Administered 2022-11-23: 3.5 mL via INTRAVENOUS

## 2022-11-23 MED ORDER — LABETALOL HCL 5 MG/ML IV SOLN
20.0000 mg | INTRAVENOUS | Status: DC | PRN
  Administered 2022-11-23: 20 mg via INTRAVENOUS
  Filled 2022-11-23: qty 4

## 2022-11-23 NOTE — Plan of Care (Signed)
  Problem: Pain Managment: Goal: General experience of comfort will improve Outcome: Progressing   Problem: Safety: Goal: Ability to remain free from injury will improve Outcome: Progressing   Problem: Skin Integrity: Goal: Risk for impaired skin integrity will decrease Outcome: Progressing   

## 2022-11-23 NOTE — Progress Notes (Signed)
  Echocardiogram 2D Echocardiogram has been performed. Definity IV ultrasound imaging agent used on this study.  Nina Perez 11/23/2022, 12:38 PM

## 2022-11-23 NOTE — Progress Notes (Signed)
[facility]  - [place] [facility]   PATIENT NAME: Nina Perez    MR#:  [id number]  DATE OF BIRTH:  1940/06/15  SUBJECTIVE:  patient seen earlier. Daughter at bedside. Came in with  chest pain since past Thursday. Gotten worse came in and was ruled in for non-STEMI. Troponin was 13,000.  Pt now off nitroglycerin gtt. No cp or sob  VITALS:  Blood pressure (!) 149/56, pulse 79, temperature 98.1 F (36.7 C), temperature source Oral, resp. rate (!) 22, height 5\' 6"  (1.676 m), weight 95.3 kg, last menstrual period 05/25/1988, SpO2 92%.  PHYSICAL EXAMINATION:   GENERAL:  82 y.o.-year-old patient with no acute distress.  LUNGS: Normal breath sounds bilaterally, no wheezing CARDIOVASCULAR: S1, S2 normal. No murmur   ABDOMEN: Soft, nontender, nondistended. Bowel sounds present.  EXTREMITIES: No  edema b/l.    NEUROLOGIC: nonfocal  patient is alert and awake SKIN: No obvious rash, lesion, or ulcer.   LABORATORY PANEL:  CBC Recent Labs  Lab 11/23/22 0442  WBC 4.2  HGB 10.2*  HCT 32.8*  PLT 120*    Chemistries  Recent Labs  Lab 11/22/22 0454  NA 137  K 3.8  CL 107  CO2 24  GLUCOSE 103*  BUN 20  CREATININE 0.81  CALCIUM 9.3   Cardiac Enzymes No results for input(s): "TROPONINI" in the last 168 hours. RADIOLOGY:  DG Chest 2 View  Result Date: 11/21/2022 CLINICAL DATA:  Chest tightness EXAM: CHEST - 2 VIEW COMPARISON:  08/30/2020, 06/17/2018 FINDINGS: Prominent right hilar vascular shadow. Cardiomegaly. No acute airspace disease, pleural effusion or pneumothorax IMPRESSION: Cardiomegaly. Electronically Signed   By: Jasmine Pang M.D.   On: 11/21/2022 18:13    Assessment and Plan  Nina Perez is a 82 y.o. Caucasian female with medical history significant for stage III chronic kidney disease, GERD, with hiatal hernia, hypertension, dyslipidemia, ILD, left bundle branch block, PAD, RA, and type 2 diabetes mellitus, who presented to the emergency room with  acute onset of left parasternal chest tightness graded 5/10 in severity with associated palpitations and radiation to both arms.    High sensitive troponin I was 13,770 and later on 13,227   EKG showed normal sinus rhythm with a rate of 79 with left bundle branch block.   NSTEMI (non-ST elevated myocardial infarction) (HCC) - continue IV heparin drip. - as needed sublingual nitroglycerin and IV morphine sulfate for pain. -  continue IV nitro drip--titrate for chest pain and BP - She will be placed on high-dose statin therapy as well as aspirin.   --Will continue Plavix. - Beta-blocker therapy will be continued. - 2D echo be obtained. - Cardiology consult with [facility]--seen by Dr Melynda Ripple on Monday. Pt and dter aware --7/14-- Off nitroglycerin gtt. Cont heparin gtt. NPO tonite   Essential hypertension - cont Losartan, Toprol XL --prn hydralazine   Dyslipidemia - continue statin therapy   Type 2 diabetes mellitus without complications (HCC) - on supplemental coverage with NovoLog. - will hold off metformin.   Gout - continue allopurinol.       DVT prophylaxis: IV heparin Advanced Care Planning:  Code Status: full code. Family communication :dter  Level of care: Progressive Status is: Inpatient Remains inpatient appropriate because: non-STEMI. Plans for cardiac cath on Monday    TOTAL TIME TAKING CARE OF THIS PATIENT: 35 minutes.  >50% time spent on counselling and coordination of care  Note: This dictation was prepared with Dragon dictation along with smaller phrase technology.  Any transcriptional errors that result from this process are unintentional.  Enedina Finner M.D    [facility]   CC: Primary care physician; Dale [place], MD

## 2022-11-23 NOTE — Progress Notes (Signed)
ANTICOAGULATION CONSULT NOTE  Pharmacy Consult for Heparin drip Indication: chest pain/ACS  Allergies  Allergen Reactions   Maxzide [Triamterene-Hctz] Other (See Comments)    Weakness/fatigue    Tape Other (See Comments)    Ob band aid adhesive  Pull skin off / Rash   Cephalexin Rash    Blotchiness to B lower legs after several days of cephalexin for UTI   Hydrochlorothiazide W-Triamterene     Other reaction(s): Other (See Comments) Extreme fatigue/weakness    Patient Measurements: Height: 5\' 6"  (167.6 cm) Weight: 95.3 kg (210 lb 1.6 oz) IBW/kg (Calculated) : 59.3 Heparin Dosing Weight: 80.5 kg  Vital Signs: Temp: 98.1 F (36.7 C) (07/14 1206) Temp Source: Oral (07/14 0830) BP: 149/56 (07/14 1206) Pulse Rate: 79 (07/14 0358)  Labs: Recent Labs    11/21/22 1741 11/21/22 1906 11/21/22 2005 11/22/22 0322 11/22/22 0454 11/22/22 1318 11/22/22 2152 11/23/22 0442 11/23/22 0607 11/23/22 1404  HGB 12.0  --   --   --  10.4*  --   --  10.2*  --   --   HCT 39.1  --   --   --  33.5*  --   --  32.8*  --   --   PLT 111*  --   --   --  113*  --   --  120*  --   --   APTT  --  34  --   --   --   --   --   --   --   --   LABPROT  --  14.5  --   --   --   --   --   --   --   --   INR  --  1.1  --   --   --   --   --   --   --   --   HEPARINUNFRC  --   --   --    < >  --    < > 0.10*  --  0.42 0.39  CREATININE 0.97  --   --   --  0.81  --   --   --   --   --   TROPONINIHS 13,770*  --  13,227*  --   --   --   --   --   --   --    < > = values in this interval not displayed.    Estimated Creatinine Clearance: 63.4 mL/min (by C-G formula based on SCr of 0.81 mg/dL).   Medical History: Past Medical History:  Diagnosis Date   Adenomatous colon polyp    Aortic atherosclerosis (HCC)    Barrett's esophagus    Cardiac murmur    CKD (chronic kidney disease), stage III (HCC)    Dyspnea on exertion    GERD (gastroesophageal reflux disease)    Gout    Hiatal hernia    History  of 2019 novel coronavirus disease (COVID-19) 08/2020   Hypercholesterolemia    Hypertension    ILD (interstitial lung disease) (HCC)    a.) 2/2 scleroderma Dx   LBBB (left bundle branch block)    Long term current use of antithrombotics/antiplatelets    a.) ASA + clopidogrel   Long term current use of immunosuppressive drug    a.) MTX for RA and scleroderma Dx   OSA on CPAP    Osteoarthritis    a.) knees, spine   PAD (peripheral artery disease) (HCC)  Phlebitis    a.) x 2 (with pregnancy)   Pulmonary fibrosis (HCC)    a.) mild   Pulmonary hypertension (HCC) 02/24/2012   a.) TTE 02/24/2012: EF 55%, RVSP 33; b.) TTE 10/12/2014: EF >55%, RVSP 36.1; c.) TTE 09/02/2017: EF 55%, RVSP 45.5   Reflux esophagitis    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 b.) renal US 05/23/2020: solid LEFT renal mass measuring 1.4 x 1.5 x 1.6. 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   unk at unk   allopurinol (ZYLOPRIM) 100 MG tablet Take 1 tablet (100 mg total) by mouth daily. 90 tablet 1 11/21/2022   aspirin 81 MG chewable tablet Chew 2 tablets (162 mg total) by mouth daily. 90 tablet 0 11/21/2022   atorvastatin (LIPITOR) 40 MG tablet Take 1 tablet by mouth once daily 90 tablet 2 11/21/2022   clopidogrel (PLAVIX)  75 MG tablet Take 1 tablet by mouth once daily with breakfast 90 tablet 0 11/21/2022   fexofenadine (ALLEGRA) 180 MG tablet Take 1 tablet (180 mg total) by mouth daily. (Patient taking differently: Take 180 mg by mouth daily as needed for allergies.) 30 tablet 1 unk at unk   folic acid (FOLVITE) 1 MG tablet Take 1 mg by mouth every evening.    Past Week   glucose blood (BAYER CONTOUR TEST) test strip USE  STRIP TO CHECK GLUCOSE TWICE DAILY. Dx: E11.9 100 each 12 unk at unk   ipratropium (ATROVENT) 0.06 % nasal spray Place 2 sprays into both nostrils 4 (four) times daily. (Patient taking differently: Place 2 sprays into both nostrils 4 (four) times daily as needed for rhinitis.) 15 mL 12 unk at unk   losartan (COZAAR) 100 MG tablet Take 1 tablet (100 mg total) by mouth daily. 90 tablet 0 11/21/2022   metFORMIN (GLUCOPHAGE) 500 MG tablet Take 1 tablet by mouth once daily 90 tablet 2 11/21/2022   methotrexate (RHEUMATREX) 2.5 MG tablet Take 10 mg by mouth every Wednesday.   Past Week   metoprolol succinate (TOPROL-XL) 50 MG 24 hr tablet Take 1 tablet (50 mg total) by mouth daily. 90 tablet 0 11/21/2022   MICROLET LANCETS MISC Check sugar once daily, [employer] Microlet Lancets. Dx E11.9 100 each 12 un at unk   pantoprazole (PROTONIX) 40 MG tablet Take 1 tablet by mouth once daily 90 tablet 3 11/21/2022   torsemide (DEMADEX) 20 MG tablet Take 20 mg by mouth as needed. Taking 1/2 tablet just as needed   unk at unk   celecoxib (CELEBREX) 200 MG capsule Take 1 capsule (200 mg total) by mouth 2 (two) times daily. (Patient not taking: Reported on 11/21/2022) 90 capsule 0 Not Taking   magnesium oxide (MAG-OX) 400 (240 Mg) MG tablet Take  1 tablet (400 mg total) by mouth 2 (two) times daily. (Patient not taking: Reported on 11/21/2022) 60 tablet 2 Not Taking   mupirocin ointment (BACTROBAN) 2 % Apply 1 Application topically 2 (two) times daily. (Patient not taking: Reported on 11/21/2022) 22 g 0 Not Taking   traMADol  (ULTRAM) 50 MG tablet Take 1 tablet (50 mg total) by mouth every 4 (four) hours as needed for moderate pain. (Patient not taking: Reported on 11/21/2022) 30 tablet 0 Not Taking   triamcinolone cream (KENALOG) 0.1 % Apply 1 application topically 2 (two) times daily. To lower legs before putting on and/or after removing compression hose for redness in legs (Patient not taking: Reported on 11/21/2022) 45 g 0 Not Taking   trospium (SANCTURA) 20 MG tablet Take 1 tablet (20 mg total) by mouth at bedtime. (Patient not taking: Reported on 11/21/2022) 30 tablet 0 Not Taking   Scheduled:   allopurinol  100 mg Oral Daily   atorvastatin  80 mg Oral Daily   clopidogrel  75 mg Oral Q breakfast   folic acid  1 mg Oral QPM   insulin aspart  0-5 Units Subcutaneous QHS   insulin aspart  0-9 Units Subcutaneous TID WC   losartan  100 mg Oral Daily   [START ON 11/26/2022] methotrexate  10 mg Oral Q Wed   metoprolol succinate  75 mg Oral Daily   pantoprazole  40 mg Oral Daily   Infusions:   heparin 1,650 Units/hr (11/23/22 0443)    Assessment: 82 yo F to start Heparin drip for ACS/STEMi, elevated troponin On ASA/plavix PTA per med rec Hgb 12.0   Plt 111  INR 1.1  aPTT 34 Plan is to proceed with cardiac catheterization on 07/15  Goal of Therapy:  Heparin level 0.3-0.7 units/ml Monitor platelets by anticoagulation protocol: Yes  Plan: heparin level remains therapeutic (2nd consecutive) ---Continue heparin infusion at 1650 units/hr ---Recheck heparin level in am  ---CBC daily while on heparin Considering Cath for Monday 7/15  Burnis Medin, PharmD, BCPS 11/23/2022 2:47 PM

## 2022-11-23 NOTE — Progress Notes (Signed)
ANTICOAGULATION CONSULT NOTE  Pharmacy Consult for Heparin drip Indication: chest pain/ACS  Allergies  Allergen Reactions   Maxzide [Triamterene-Hctz] Other (See Comments)    Weakness/fatigue    Tape Other (See Comments)    Ob band aid adhesive  Pull skin off / Rash   Cephalexin Rash    Blotchiness to B lower legs after several days of cephalexin for UTI   Hydrochlorothiazide W-Triamterene     Other reaction(s): Other (See Comments) Extreme fatigue/weakness    Patient Measurements: Height: 5\' 6"  (167.6 cm) Weight: 95.3 kg (210 lb 1.6 oz) IBW/kg (Calculated) : 59.3 Heparin Dosing Weight: 80.5 kg  Vital Signs: Temp: 98.5 F (36.9 C) (07/14 0358) BP: 167/81 (07/14 0358) Pulse Rate: 79 (07/14 0358)  Labs: Recent Labs    11/21/22 1741 11/21/22 1906 11/21/22 2005 11/22/22 0322 11/22/22 0454 11/22/22 1318 11/22/22 2152 11/23/22 0442 11/23/22 0607  HGB 12.0  --   --   --  10.4*  --   --  10.2*  --   HCT 39.1  --   --   --  33.5*  --   --  32.8*  --   PLT 111*  --   --   --  113*  --   --  120*  --   APTT  --  34  --   --   --   --   --   --   --   LABPROT  --  14.5  --   --   --   --   --   --   --   INR  --  1.1  --   --   --   --   --   --   --   HEPARINUNFRC  --   --   --    < >  --  0.25* 0.10*  --  0.42  CREATININE 0.97  --   --   --  0.81  --   --   --   --   TROPONINIHS 13,770*  --  13,227*  --   --   --   --   --   --    < > = values in this interval not displayed.    Estimated Creatinine Clearance: 63.4 mL/min (by C-G formula based on SCr of 0.81 mg/dL).   Medical History: Past Medical History:  Diagnosis Date   Adenomatous colon polyp    Aortic atherosclerosis (HCC)    Barrett's esophagus    Cardiac murmur    CKD (chronic kidney disease), stage III (HCC)    Dyspnea on exertion    GERD (gastroesophageal reflux disease)    Gout    Hiatal hernia    History of 2019 novel coronavirus disease (COVID-19) 08/2020   Hypercholesterolemia     Hypertension    ILD (interstitial lung disease) (HCC)    a.) 2/2 scleroderma Dx   LBBB (left bundle branch block)    Long term current use of antithrombotics/antiplatelets    a.) ASA + clopidogrel   Long term current use of immunosuppressive drug    a.) MTX for RA and scleroderma Dx   OSA on CPAP    Osteoarthritis    a.) knees, spine   PAD (peripheral artery disease) (HCC)    Phlebitis    a.) x 2 (with pregnancy)   Pulmonary fibrosis (HCC)    a.) mild   Pulmonary hypertension (HCC) 02/24/2012   a.) TTE 02/24/2012: EF 55%,  RVSP 33; b.) TTE 10/12/2014: EF >55%, RVSP 36.1; c.) TTE 09/02/2017: EF 55%, RVSP 45.5   Reflux esophagitis    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 b.) renal US 05/23/2020: solid LEFT renal mass measuring 1.4 x 1.5 x 1.6. 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   unk at unk   allopurinol (ZYLOPRIM) 100 MG tablet Take 1 tablet (100 mg total) by mouth daily. 90 tablet 1 11/21/2022   aspirin 81 MG chewable tablet Chew 2 tablets (162 mg total) by mouth daily. 90 tablet 0 11/21/2022   atorvastatin (LIPITOR) 40 MG tablet Take 1 tablet by mouth once daily 90 tablet 2 11/21/2022   clopidogrel (PLAVIX) 75 MG tablet Take 1 tablet by mouth once daily with breakfast 90 tablet 0  11/21/2022   fexofenadine (ALLEGRA) 180 MG tablet Take 1 tablet (180 mg total) by mouth daily. (Patient taking differently: Take 180 mg by mouth daily as needed for allergies.) 30 tablet 1 unk at unk   folic acid (FOLVITE) 1 MG tablet Take 1 mg by mouth every evening.    Past Week   glucose blood (BAYER CONTOUR TEST) test strip USE  STRIP TO CHECK GLUCOSE TWICE DAILY. Dx: E11.9 100 each 12 unk at unk   ipratropium (ATROVENT) 0.06 % nasal spray Place 2 sprays into both nostrils 4 (four) times daily. (Patient taking differently: Place 2 sprays into both nostrils 4 (four) times daily as needed for rhinitis.) 15 mL 12 unk at unk   losartan (COZAAR) 100 MG tablet Take 1 tablet (100 mg total) by mouth daily. 90 tablet 0 11/21/2022   metFORMIN (GLUCOPHAGE) 500 MG tablet Take 1 tablet by mouth once daily 90 tablet 2 11/21/2022   methotrexate (RHEUMATREX) 2.5 MG tablet Take 10 mg by mouth every Wednesday.   Past Week   metoprolol succinate (TOPROL-XL) 50 MG 24 hr tablet Take 1 tablet (50 mg total) by mouth daily. 90 tablet 0 11/21/2022   MICROLET LANCETS MISC Check sugar once daily, [employer] Microlet Lancets. Dx E11.9 100 each 12 un at unk   pantoprazole (PROTONIX) 40 MG tablet Take 1 tablet by mouth once daily 90 tablet 3 11/21/2022   torsemide (DEMADEX) 20 MG tablet Take 20 mg by mouth as needed. Taking 1/2 tablet just as needed   unk at unk   celecoxib (CELEBREX) 200 MG capsule Take 1 capsule (200 mg total) by mouth 2 (two) times daily. (Patient not taking: Reported on 11/21/2022) 90 capsule 0 Not Taking   magnesium oxide (MAG-OX) 400 (240 Mg) MG tablet Take 1 tablet (400 mg total) by mouth 2 (two) times daily. (Patient not taking: Reported on 11/21/2022) 60 tablet 2 Not Taking   mupirocin ointment (BACTROBAN) 2 % Apply 1 Application  topically 2 (two) times daily. (Patient not taking: Reported on 11/21/2022) 22 g 0 Not Taking   traMADol (ULTRAM) 50 MG tablet Take 1 tablet (50 mg total) by mouth every 4 (four) hours  as needed for moderate pain. (Patient not taking: Reported on 11/21/2022) 30 tablet 0 Not Taking   triamcinolone cream (KENALOG) 0.1 % Apply 1 application topically 2 (two) times daily. To lower legs before putting on and/or after removing compression hose for redness in legs (Patient not taking: Reported on 11/21/2022) 45 g 0 Not Taking   trospium (SANCTURA) 20 MG tablet Take 1 tablet (20 mg total) by mouth at bedtime. (Patient not taking: Reported on 11/21/2022) 30 tablet 0 Not Taking   Scheduled:   allopurinol  100 mg Oral Daily   atorvastatin  80 mg Oral Daily   clopidogrel  75 mg Oral Q breakfast   folic acid  1 mg Oral QPM   insulin aspart  0-5 Units Subcutaneous QHS   insulin aspart  0-9 Units Subcutaneous TID WC   losartan  100 mg Oral Daily   [START ON 11/26/2022] methotrexate  10 mg Oral Q Wed   metoprolol succinate  75 mg Oral Daily   pantoprazole  40 mg Oral Daily   Infusions:   sodium chloride 50 mL/hr at 11/23/22 0446   heparin 1,650 Units/hr (11/23/22 0443)   nitroGLYCERIN Stopped (11/23/22 0341)    Assessment: 82 yo F to start Heparin drip for ACS/STEMi, elevated troponin On ASA/plavix PTA per med rec Hgb 12.0   Plt 111  INR 1.1  aPTT 34  7/13 0322 HL 0.11 Subtherapeutic-  inc to 1250 u/hr 7/13 1318  HL 0.25 Subtherapeutic  1250> 1400 u/hr  Goal of Therapy:  Heparin level 0.3-0.7 units/ml Monitor platelets by anticoagulation protocol: Yes   07/13 0322 HL 0.11, subtherapeutic 7/13 1318  HL= 0.25 Subtherapeutic 07/13 2152 HL 0.10, subtherapeutic 07/14 0607 HL 0.42, therapeutic x 1  Plan: Continue heparin infusion at 1650 units/hr Recheck HL in 8 hrs to confirm CBC daily while on heparin Considering Cath for Monday 7/15  Otelia Sergeant, PharmD, [facility] 11/23/2022 6:42 AM

## 2022-11-23 NOTE — Progress Notes (Signed)
SUBJECTIVE: Patient is feeling better today with no tightness in the chest.   Vitals:   11/22/22 1919 11/23/22 0001 11/23/22 0358 11/23/22 0830  BP: 126/64  (!) 167/81 (!) 168/68  Pulse: 82 79 79   Resp: 19 19 20 19   Temp: 99.4 F (37.4 C) 98.4 F (36.9 C) 98.5 F (36.9 C) 98.1 F (36.7 C)  TempSrc:    Oral  SpO2: 93% 92% 92% 92%  Weight:      Height:        Intake/Output Summary (Last 24 hours) at 11/23/2022 1204 Last data filed at 11/23/2022 0900 Gross per 24 hour  Intake 437 ml  Output 200 ml  Net 237 ml    LABS: Basic Metabolic Panel: Recent Labs    11/21/22 1741 11/22/22 0454  NA 135 137  K 4.2 3.8  CL 104 107  CO2 22 24  GLUCOSE 149* 103*  BUN 21 20  CREATININE 0.97 0.81  CALCIUM 9.7 9.3   Liver Function Tests: No results for input(s): "AST", "ALT", "ALKPHOS", "BILITOT", "PROT", "ALBUMIN" in the last 72 hours. No results for input(s): "LIPASE", "AMYLASE" in the last 72 hours. CBC: Recent Labs    11/22/22 0454 11/23/22 0442  WBC 4.5 4.2  HGB 10.4* 10.2*  HCT 33.5* 32.8*  MCV 96.3 97.3  PLT 113* 120*   Cardiac Enzymes: No results for input(s): "CKTOTAL", "CKMB", "CKMBINDEX", "TROPONINI" in the last 72 hours. BNP: Invalid input(s): "POCBNP" D-Dimer: No results for input(s): "DDIMER" in the last 72 hours. Hemoglobin A1C: No results for input(s): "HGBA1C" in the last 72 hours. Fasting Lipid Panel: Recent Labs    11/22/22 0454  CHOL 83  HDL 33*  LDLCALC 37  TRIG 66  CHOLHDL 2.5   Thyroid Function Tests: No results for input(s): "TSH", "T4TOTAL", "T3FREE", "THYROIDAB" in the last 72 hours.  Invalid input(s): "FREET3" Anemia Panel: No results for input(s): "VITAMINB12", "FOLATE", "FERRITIN", "TIBC", "IRON", "RETICCTPCT" in the last 72 hours.   PHYSICAL EXAM General: Well developed, well nourished, in no acute distress HEENT:  Normocephalic and atramatic Neck:  No JVD.  Lungs: Clear bilaterally to auscultation and percussion. Heart:  HRRR . Normal S1 and S2 without gallops or murmurs.  Abdomen: Bowel sounds are positive, abdomen soft and non-tender  Msk:  Back normal, normal gait. Normal strength and tone for age. Extremities: No clubbing, cyanosis or edema.   Neuro: Alert and oriented X 3. Psych:  Good affect, responds appropriately  TELEMETRY: Sinus rhythm  ASSESSMENT AND PLAN: Non-STEMI, patient had symptoms started last Thursday with tightness in the chest but decided to come to the hospital on Saturday and troponins were as high as 13,000 with old left bundle branch block.  Discussed the risk and benefits of cardiac catheterization with the patient and patient has agreed to proceed with cardiac catheterization in the morning.  Advise continue heparin and will give IV overnight IV fluids since patient has renal insufficiency.  Patient is set up for cardiac catheterization in the morning.   ICD-10-CM   1. NSTEMI (non-ST elevated myocardial infarction) (HCC)  I21.4       Principal Problem:   NSTEMI (non-ST elevated myocardial infarction) (HCC) Active Problems:   Gout   Type 2 diabetes mellitus without complications (HCC)   Dyslipidemia   Essential hypertension    [name], MD, [facility] 11/23/2022 12:04 PM

## 2022-11-23 NOTE — Plan of Care (Signed)
  Problem: Education: Goal: Understanding of cardiac disease, CV risk reduction, and recovery process will improve Outcome: Progressing   

## 2022-11-23 NOTE — Progress Notes (Signed)
Nitroglycerin drip stopped. Chest pain resolved over 12 hours ago. Blood pressure to be managed with prn labetalol and hydralazine. Plan of care discussed with overnight attending Mansy via secure chat.

## 2022-11-24 ENCOUNTER — Other Ambulatory Visit: Payer: Self-pay

## 2022-11-24 ENCOUNTER — Encounter
Admission: EM | Disposition: A | Discharge status: Skilled Nursing Facility | Payer: Self-pay | Source: Home / Self Care | Attending: Internal Medicine

## 2022-11-24 ENCOUNTER — Telehealth: Payer: Self-pay | Admitting: Internal Medicine

## 2022-11-24 DIAGNOSIS — I2510 Atherosclerotic heart disease of native coronary artery without angina pectoris: Secondary | ICD-10-CM | POA: Diagnosis not present

## 2022-11-24 DIAGNOSIS — I214 Non-ST elevation (NSTEMI) myocardial infarction: Secondary | ICD-10-CM | POA: Diagnosis not present

## 2022-11-24 HISTORY — PX: LEFT HEART CATH AND CORONARY ANGIOGRAPHY: CATH118249

## 2022-11-24 HISTORY — PX: CORONARY STENT INTERVENTION: CATH118234

## 2022-11-24 LAB — CBC
HCT: 35.7 % — ABNORMAL LOW (ref 36.0–46.0)
Hemoglobin: 11.1 g/dL — ABNORMAL LOW (ref 12.0–15.0)
MCH: 30 pg (ref 26.0–34.0)
MCHC: 31.1 g/dL (ref 30.0–36.0)
MCV: 96.5 fL (ref 80.0–100.0)
Platelets: 135 10*3/uL — ABNORMAL LOW (ref 150–400)
RBC: 3.7 MIL/uL — ABNORMAL LOW (ref 3.87–5.11)
RDW: 19.4 % — ABNORMAL HIGH (ref 11.5–15.5)
WBC: 3.8 10*3/uL — ABNORMAL LOW (ref 4.0–10.5)
nRBC: 0 % (ref 0.0–0.2)

## 2022-11-24 LAB — GLUCOSE, CAPILLARY
Glucose-Capillary: 87 mg/dL (ref 70–99)
Glucose-Capillary: 89 mg/dL (ref 70–99)
Glucose-Capillary: 97 mg/dL (ref 70–99)
Glucose-Capillary: 101 mg/dL — ABNORMAL HIGH (ref 70–99)

## 2022-11-24 LAB — POCT ACTIVATED CLOTTING TIME: Activated Clotting Time: 360 seconds

## 2022-11-24 LAB — HEPARIN LEVEL (UNFRACTIONATED): Heparin Unfractionated: 0.41 IU/mL (ref 0.30–0.70)

## 2022-11-24 LAB — CREATININE, SERUM
Creatinine, Ser: 0.76 mg/dL (ref 0.44–1.00)
GFR, Estimated: >60 mL/min (ref >60)

## 2022-11-24 SURGERY — LEFT HEART CATH AND CORONARY ANGIOGRAPHY
Anesthesia: Moderate Sedation | Laterality: Right

## 2022-11-24 MED ORDER — SODIUM CHLORIDE 0.9% FLUSH: 3.0000 mL | INTRAVENOUS | Status: DC | PRN

## 2022-11-24 MED ORDER — SODIUM CHLORIDE 0.9 % IV SOLN: INTRAVENOUS | Status: AC

## 2022-11-24 MED ORDER — SODIUM CHLORIDE 0.9 % WEIGHT BASED INFUSION
1.0000 mL/kg/h | INTRAVENOUS | Status: DC
  Administered 2022-11-24: 1 mL/kg/h via INTRAVENOUS

## 2022-11-24 MED ORDER — IOHEXOL 300 MG/ML  SOLN
INTRAMUSCULAR | Status: DC | PRN
  Administered 2022-11-24: 61 mL

## 2022-11-24 MED ORDER — SODIUM CHLORIDE 0.9 % IV SOLN: 250.0000 mL | INTRAVENOUS | Status: DC | PRN

## 2022-11-24 MED ORDER — ENOXAPARIN SODIUM 60 MG/0.6ML IJ SOSY
50.0000 mg | PREFILLED_SYRINGE | INTRAMUSCULAR | Status: DC
  Administered 2022-11-25 – 2022-11-27 (×3): 50 mg via SUBCUTANEOUS
  Filled 2022-11-24 (×3): qty 0.6

## 2022-11-24 MED ORDER — HEPARIN (PORCINE) IN NACL 1000-0.9 UT/500ML-% IV SOLN
INTRAVENOUS | Status: DC | PRN
  Administered 2022-11-24 (×2): 500 mL

## 2022-11-24 MED ORDER — ASPIRIN 81 MG PO CHEW
CHEWABLE_TABLET | ORAL | Status: AC
  Filled 2022-11-24: qty 1

## 2022-11-24 MED ORDER — CLOPIDOGREL BISULFATE 75 MG PO TABS
ORAL_TABLET | ORAL | Status: DC | PRN
  Administered 2022-11-24: 300 mg via ORAL

## 2022-11-24 MED ORDER — FENTANYL CITRATE (PF) 100 MCG/2ML IJ SOLN
INTRAMUSCULAR | Status: DC | PRN
  Administered 2022-11-24 (×2): 25 ug via INTRAVENOUS

## 2022-11-24 MED ORDER — METOPROLOL SUCCINATE ER 100 MG PO TB24
100.0000 mg | ORAL_TABLET | Freq: Every day | ORAL | Status: DC
  Administered 2022-11-24 – 2022-11-27 (×4): 100 mg via ORAL
  Filled 2022-11-24 (×3): qty 1

## 2022-11-24 MED ORDER — LABETALOL HCL 5 MG/ML IV SOLN
INTRAVENOUS | Status: AC
  Filled 2022-11-24: qty 4

## 2022-11-24 MED ORDER — IOHEXOL 300 MG/ML  SOLN
INTRAMUSCULAR | Status: DC | PRN
  Administered 2022-11-24: 79 mL

## 2022-11-24 MED ORDER — HEPARIN SODIUM (PORCINE) 1000 UNIT/ML IJ SOLN
INTRAMUSCULAR | Status: DC | PRN
  Administered 2022-11-24: 9000 [IU] via INTRAVENOUS

## 2022-11-24 MED ORDER — LABETALOL HCL 5 MG/ML IV SOLN
INTRAVENOUS | Status: DC | PRN
  Administered 2022-11-24: 10 mg via INTRAVENOUS

## 2022-11-24 MED ORDER — NITROGLYCERIN 1 MG/10 ML FOR IR/CATH LAB
INTRA_ARTERIAL | Status: AC
  Filled 2022-11-24: qty 10

## 2022-11-24 MED ORDER — HEPARIN SODIUM (PORCINE) 1000 UNIT/ML IJ SOLN
INTRAMUSCULAR | Status: AC
  Filled 2022-11-24: qty 10

## 2022-11-24 MED ORDER — LABETALOL HCL 5 MG/ML IV SOLN: 10.0000 mg | INTRAVENOUS | Status: AC | PRN

## 2022-11-24 MED ORDER — METOPROLOL SUCCINATE ER 50 MG PO TB24
ORAL_TABLET | ORAL | Status: AC
  Filled 2022-11-24: qty 2

## 2022-11-24 MED ORDER — MIDAZOLAM HCL 2 MG/2ML IJ SOLN
INTRAMUSCULAR | Status: DC | PRN
  Administered 2022-11-24: 1 mg via INTRAVENOUS

## 2022-11-24 MED ORDER — FENTANYL CITRATE (PF) 100 MCG/2ML IJ SOLN
INTRAMUSCULAR | Status: AC
  Filled 2022-11-24: qty 2

## 2022-11-24 MED ORDER — SODIUM CHLORIDE 0.9% FLUSH
3.0000 mL | Freq: Two times a day (BID) | INTRAVENOUS | Status: DC
  Administered 2022-11-24 – 2022-11-27 (×7): 3 mL via INTRAVENOUS

## 2022-11-24 MED ORDER — MIDAZOLAM HCL 2 MG/2ML IJ SOLN
INTRAMUSCULAR | Status: AC
  Filled 2022-11-24: qty 2

## 2022-11-24 MED ORDER — NITROGLYCERIN 1 MG/10 ML FOR IR/CATH LAB
INTRA_ARTERIAL | Status: DC | PRN
  Administered 2022-11-24: 200 ug via INTRACORONARY

## 2022-11-24 MED ORDER — ASPIRIN 81 MG PO CHEW
81.0000 mg | CHEWABLE_TABLET | ORAL | Status: AC
  Administered 2022-11-24: 81 mg via ORAL

## 2022-11-24 MED ORDER — CLOPIDOGREL BISULFATE 75 MG PO TABS
ORAL_TABLET | ORAL | Status: AC
  Filled 2022-11-24: qty 4

## 2022-11-24 MED ORDER — LIDOCAINE HCL (PF) 1 % IJ SOLN
INTRAMUSCULAR | Status: DC | PRN
  Administered 2022-11-24: 20 mL

## 2022-11-24 MED ORDER — SODIUM CHLORIDE 0.9% FLUSH: 3.0000 mL | Freq: Two times a day (BID) | INTRAVENOUS | Status: DC

## 2022-11-24 MED ORDER — LIDOCAINE HCL 1 % IJ SOLN
INTRAMUSCULAR | Status: AC
  Filled 2022-11-24: qty 20

## 2022-11-24 SURGICAL SUPPLY — 21 items
BALLN EUPHORA RX 2.5X12 (BALLOONS) ×2
BALLN ~~LOC~~ EUPHORA RX 3.0X15 (BALLOONS) ×2
BALLOON EUPHORA RX 2.5X12 (BALLOONS) IMPLANT
BALLOON ~~LOC~~ EUPHORA RX 3.0X15 (BALLOONS) IMPLANT
CATH INFINITI 5FR MULTPACK ANG (CATHETERS) IMPLANT
CATH LAUNCHER 6FR JR4 (CATHETERS) IMPLANT
DEVICE CLOSURE MYNXGRIP 6/7F (Vascular Products) IMPLANT
DRAPE FEMORAL ANGIO W/ POUCH (DRAPES) IMPLANT
KIT ENCORE 26 ADVANTAGE (KITS) IMPLANT
NDL PERC 18GX7CM (NEEDLE) IMPLANT
NEEDLE PERC 18GX7CM (NEEDLE) ×2 IMPLANT
PACK CARDIAC CATH (CUSTOM PROCEDURE TRAY) ×2 IMPLANT
PROTECTION STATION PRESSURIZED (MISCELLANEOUS) ×2
SET ATX-X65L (MISCELLANEOUS) IMPLANT
SHEATH AVANTI 5FR X 11CM (SHEATH) IMPLANT
SHEATH AVANTI 6FR X 11CM (SHEATH) IMPLANT
STATION PROTECTION PRESSURIZED (MISCELLANEOUS) IMPLANT
STENT ONYX FRONTIER 2.75X22 (Permanent Stent) IMPLANT
TUBING CIL FLEX 10 FLL-RA (TUBING) IMPLANT
WIRE GUIDERIGHT .035X150 (WIRE) IMPLANT
WIRE RUNTHROUGH .014X180CM (WIRE) IMPLANT

## 2022-11-24 NOTE — Progress Notes (Signed)
SUBJECTIVE: Patient denies any chest pain or shortness of breath   Vitals:   11/24/22 0000 11/24/22 0400 11/24/22 0756 11/24/22 0812  BP:      Pulse: 71 74 76   Resp:   20   Temp: 98.6 F (37 C) 98.7 F (37.1 C)    TempSrc: Oral Oral    SpO2: 92% 94% 90% 93%  Weight:      Height:        Intake/Output Summary (Last 24 hours) at 11/24/2022 0840 Last data filed at 11/24/2022 0700 Gross per 24 hour  Intake 1580.76 ml  Output 2300 ml  Net -719.24 ml    LABS: Basic Metabolic Panel: Recent Labs    11/21/22 1741 11/22/22 0454  NA 135 137  K 4.2 3.8  CL 104 107  CO2 22 24  GLUCOSE 149* 103*  BUN 21 20  CREATININE 0.97 0.81  CALCIUM 9.7 9.3   Liver Function Tests: No results for input(s): "AST", "ALT", "ALKPHOS", "BILITOT", "PROT", "ALBUMIN" in the last 72 hours. No results for input(s): "LIPASE", "AMYLASE" in the last 72 hours. CBC: Recent Labs    11/23/22 0442 11/24/22 0434  WBC 4.2 3.8*  HGB 10.2* 11.1*  HCT 32.8* 35.7*  MCV 97.3 96.5  PLT 120* 135*   Cardiac Enzymes: No results for input(s): "CKTOTAL", "CKMB", "CKMBINDEX", "TROPONINI" in the last 72 hours. BNP: Invalid input(s): "POCBNP" D-Dimer: No results for input(s): "DDIMER" in the last 72 hours. Hemoglobin A1C: No results for input(s): "HGBA1C" in the last 72 hours. Fasting Lipid Panel: Recent Labs    11/22/22 0454  CHOL 83  HDL 33*  LDLCALC 37  TRIG 66  CHOLHDL 2.5   Thyroid Function Tests: No results for input(s): "TSH", "T4TOTAL", "T3FREE", "THYROIDAB" in the last 72 hours.  Invalid input(s): "FREET3" Anemia Panel: No results for input(s): "VITAMINB12", "FOLATE", "FERRITIN", "TIBC", "IRON", "RETICCTPCT" in the last 72 hours.   PHYSICAL EXAM General: Well developed, well nourished, in no acute distress HEENT:  Normocephalic and atramatic Neck:  No JVD.  Lungs: Clear bilaterally to auscultation and percussion. Heart: HRRR . Normal S1 and S2 without gallops or murmurs.  Abdomen:  Bowel sounds are positive, abdomen soft and non-tender  Msk:  Back normal, normal gait. Normal strength and tone for age. Extremities: No clubbing, cyanosis or edema.   Neuro: Alert and oriented X 3. Psych:  Good affect, responds appropriately  TELEMETRY: Sinus rhythm  ASSESSMENT AND PLAN: Non-STEMI with chest pain starting last Thursday and peak troponin over 13,000.  Patient was treated with IV nitro and IV heparin and was already on Plavix.  Patient had still some tightness in the chest when I saw her Saturday morning thus was scheduled for cardiac cath this morning.  On Sunday morning her chest pain subsided.  Cardiac catheterization revealed this morning high-grade lesion in hide marginal branch of right coronary which is supplying the PDA.  Thus PCI and stenting will be done by Dr.Arida.  Family informed.   ICD-10-CM   1. NSTEMI (non-ST elevated myocardial infarction) (HCC)  I21.4       Principal Problem:   NSTEMI (non-ST elevated myocardial infarction) (HCC) Active Problems:   Gout   Type 2 diabetes mellitus without complications (HCC)   Dyslipidemia   Essential hypertension    Nina Blackwater, MD, [facility] 11/24/2022 8:40 AM

## 2022-11-24 NOTE — Progress Notes (Signed)
[facility]  - [place] at [facility]   PATIENT NAME: Nina Perez    MR#:  [id number]  DATE OF BIRTH:  Dec 15, 1940  SUBJECTIVE:  patient seen earlier. Daughter at bedside. Came in with  chest pain since past Thursday. Gotten worse came in and was ruled in for non-STEMI. Troponin was 13,000.   S/p cath with PCI stent M2 branch of RCA  VITALS:  Blood pressure (!) 183/89, pulse 71, temperature 97.6 F (36.4 C), temperature source Oral, resp. rate 20, height 5\' 6"  (1.676 m), weight 95.3 kg, last menstrual period 05/25/1988, SpO2 96%.  PHYSICAL EXAMINATION:   GENERAL:  82 y.o.-year-old patient with no acute distress.  LUNGS: Normal breath sounds bilaterally, no wheezing CARDIOVASCULAR: S1, S2 normal. No murmur   ABDOMEN: Soft, nontender, nondistended. Bowel sounds present.  EXTREMITIES: No  edema b/l.    NEUROLOGIC: nonfocal  patient is alert and awake SKIN: No obvious rash, lesion, or ulcer.   LABORATORY PANEL:  CBC Recent Labs  Lab 11/24/22 0434  WBC 3.8*  HGB 11.1*  HCT 35.7*  PLT 135*    Chemistries  Recent Labs  Lab 11/22/22 0454 11/24/22 0433  NA 137  --   K 3.8  --   CL 107  --   CO2 24  --   GLUCOSE 103*  --   BUN 20  --   CREATININE 0.81 0.76  CALCIUM 9.3  --    Cardiac Enzymes No results for input(s): "TROPONINI" in the last 168 hours. RADIOLOGY:  CARDIAC CATHETERIZATION  Result Date: 11/24/2022   RV Branch lesion is 90% stenosed.   Prox RCA lesion is 40% stenosed.   1st Mrg lesion is 55% stenosed.   1st Diag lesion is 60% stenosed.   The left ventricular systolic function is normal.   LV end diastolic pressure is mildly elevated.   The left ventricular ejection fraction is 50-55% by visual estimate.   There is trivial (1+) mitral regurgitation.   Recommend dual antiplatelet therapy with Aspirin 81mg  daily and Clopidogrel 75mg  daily. Patient had non-STEMI with troponin as high as 13,000.  Culprit lesion probably is marginal branch of right  coronary artery as there is a high-grade lesion about 90%.  Advise PCI and stenting of the marginal branch of right coronary artery.  LVEF was 53% on echocardiogram with wall motion abnormality in the septum as well as anterior wall most likely due to left bundle branch block.   CARDIAC CATHETERIZATION  Result Date: 11/24/2022   1st Mrg lesion is 70% stenosed.   2nd Diag lesion is 80% stenosed.   RV Branch lesion is 90% stenosed.   A drug-eluting stent was successfully placed using a STENT ONYX FRONTIER 2.75X22.   Post intervention, there is a 0% residual stenosis. Successful angioplasty and drug-eluting stent placement to proximal RV marginal of the right coronary artery which is large in size and supplies the right PDA distribution. Recommendations: Dual antiplatelet therapy for 12 months. Aggressive treatment of risk factors. The second diagonal is diseased but relatively small in size.  No PCI is recommended. I increased Toprol for better blood pressure control.   ECHOCARDIOGRAM COMPLETE  Result Date: 11/23/2022    ECHOCARDIOGRAM REPORT   Patient Name:   Nina Perez Date of Exam: 11/23/2022 Medical Rec #:  [id number]    Height:       66.0 in Accession #:    [id number]   Weight:       210.1 lb Date of  Birth:  Feb 27, 1941    BSA:          2.042 m Patient Age:    82 years     BP:           168/68 mmHg Patient Gender: F            HR:           77 bpm. Exam Location:  [facility] Procedure: 2D Echo and Intracardiac Opacification Agent Indications:     NSTEMI I21.4  History:         Patient has no prior history of Echocardiogram examinations.  Sonographer:     Overton Mam RDCS, FASE Referring Phys:  [id number] [name] Diagnosing Phys: [name]  Sonographer Comments: Technically difficult study due to poor echo windows and no subcostal window. Image acquisition challenging due to patient body habitus. IMPRESSIONS  1. Left ventricular ejection fraction, by estimation, is 50 to 55%. The left ventricle has low  normal function. The left ventricle demonstrates regional wall motion abnormalities (see scoring diagram/findings for description). Left ventricular diastolic  parameters are consistent with Grade III diastolic dysfunction (restrictive).  2. Right ventricular systolic function is normal. The right ventricular size is mildly enlarged.  3. Left atrial size was moderately dilated.  4. Right atrial size was moderately dilated.  5. The mitral valve is normal in structure. Mild mitral valve regurgitation. No evidence of mitral stenosis.  6. The aortic valve is normal in structure. Aortic valve regurgitation is mild. Aortic valve sclerosis is present, with no evidence of aortic valve stenosis.  7. The inferior vena cava is normal in size with greater than 50% respiratory variability, suggesting right atrial pressure of 3 mmHg. FINDINGS  Left Ventricle: Left ventricular ejection fraction, by estimation, is 50 to 55%. The left ventricle has low normal function. The left ventricle demonstrates regional wall motion abnormalities. Definity contrast agent was given IV to delineate the left ventricular endocardial borders. The left ventricular internal cavity size was normal in size. There is no left ventricular hypertrophy. Left ventricular diastolic parameters are consistent with Grade III diastolic dysfunction (restrictive).  LV Wall Scoring: The inferior septum is akinetic. The anterior septum is hypokinetic. Right Ventricle: The right ventricular size is mildly enlarged. No increase in right ventricular wall thickness. Right ventricular systolic function is normal. Left Atrium: Left atrial size was moderately dilated. Right Atrium: Right atrial size was moderately dilated. Pericardium: There is no evidence of pericardial effusion. Mitral Valve: The mitral valve is normal in structure. Mild mitral valve regurgitation. No evidence of mitral valve stenosis. Tricuspid Valve: The tricuspid valve is normal in structure. Tricuspid  valve regurgitation is mild . No evidence of tricuspid stenosis. Aortic Valve: The aortic valve is normal in structure. Aortic valve regurgitation is mild. Aortic valve sclerosis is present, with no evidence of aortic valve stenosis. Aortic valve mean gradient measures 11.5 mmHg. Aortic valve peak gradient measures 19.3 mmHg. Aortic valve area, by VTI measures 1.21 cm. Pulmonic Valve: The pulmonic valve was normal in structure. Pulmonic valve regurgitation is not visualized. No evidence of pulmonic stenosis. Aorta: The aortic root is normal in size and structure. Venous: The inferior vena cava is normal in size with greater than 50% respiratory variability, suggesting right atrial pressure of 3 mmHg. IAS/Shunts: No atrial level shunt detected by color flow Doppler.  LEFT VENTRICLE PLAX 2D LVIDd:         5.30 cm      Diastology LVIDs:  3.90 cm      LV e' medial:    5.44 cm/s LV PW:         1.45 cm      LV E/e' medial:  17.8 LV IVS:        1.15 cm      LV e' lateral:   5.00 cm/s LVOT diam:     1.80 cm      LV E/e' lateral: 19.4 LV SV:         55 LV SV Index:   27 LVOT Area:     2.54 cm  LV Volumes (MOD) LV vol d, MOD A2C: 158.0 ml LV vol d, MOD A4C: 115.8 ml LV vol s, MOD A2C: 62.0 ml LV vol s, MOD A4C: 55.5 ml LV SV MOD A2C:     96.0 ml LV SV MOD A4C:     115.8 ml LV SV MOD BP:      75.9 ml RIGHT VENTRICLE RV Basal diam:  3.10 cm RV S prime:     17.00 cm/s TAPSE (M-mode): 2.9 cm LEFT ATRIUM             Index        RIGHT ATRIUM           Index LA diam:        5.00 cm 2.45 cm/m   RA Area:     10.30 cm LA Vol (A2C):   44.7 ml 21.89 ml/m  RA Volume:   21.30 ml  10.43 ml/m LA Vol (A4C):   74.6 ml 36.53 ml/m LA Biplane Vol: 60.8 ml 29.77 ml/m  AORTIC VALVE                     PULMONIC VALVE AV Area (Vmax):    1.34 cm      PV Vmax:       1.25 m/s AV Area (Vmean):   1.31 cm      PV Peak grad:  6.2 mmHg AV Area (VTI):     1.21 cm AV Vmax:           219.67 cm/s AV Vmean:          157.500 cm/s AV VTI:             0.459 m AV Peak Grad:      19.3 mmHg AV Mean Grad:      11.5 mmHg LVOT Vmax:         116.00 cm/s LVOT Vmean:        80.800 cm/s LVOT VTI:          0.218 m LVOT/AV VTI ratio: 0.47  AORTA Ao Root diam: 3.40 cm Ao Asc diam:  2.90 cm MITRAL VALVE MV Area (PHT): 5.79 cm     SHUNTS MV Decel Time: 131 msec     Systemic VTI:  0.22 m MV E velocity: 96.90 cm/s   Systemic Diam: 1.80 cm MV A velocity: 149.00 cm/s MV E/A ratio:  0.65 Shaukat Khan Electronically signed by [name] Signature Date/Time: 11/23/2022/1:02:50 PM    Final     Assessment and Plan  JAMYIA FORTUNE is a 82 y.o. Caucasian female with medical history significant for stage III chronic kidney disease, GERD, with hiatal hernia, hypertension, dyslipidemia, ILD, left bundle branch block, PAD, RA, and type 2 diabetes mellitus, who presented to the emergency room with acute onset of left parasternal chest tightness graded 5/10 in severity with associated palpitations and radiation  to both arms.    High sensitive troponin I was 13,770 and later on 13,227   EKG showed normal sinus rhythm with a rate of 79 with left bundle branch block.   NSTEMI (non-ST elevated myocardial infarction) (HCC) - continue IV heparin drip. - as needed sublingual nitroglycerin and IV morphine sulfate for pain. -  continue IV nitro drip--titrate for chest pain and BP - She will be placed on high-dose statin therapy as well as aspirin.   --Will continue Plavix. - Beta-blocker therapy will be continued. - 2D echo be obtained. - Cardiology consult with KC--seen by Dr Melynda Ripple on Monday. Pt and dter aware --7/14-- Off nitroglycerin gtt. Cont heparin gtt. NPO tonite --7/15-- s/p cath by Drs khan/Arida--   1st Mrg lesion is 70% stenosed.    2nd Diag lesion is 80% stenosed.    RV Branch lesion is 90% stenosed.    A drug-eluting stent was successfully placed using a STENT ONYX  FRONTIER 2.75X22.    Post intervention, there is a 0% residual stenosis.  Successful  angioplasty and drug-eluting stent placement to proximal RV  marginal of the right coronary artery which is large in size and supplies  the right PDA distribution.  --now on Asa +plavix   Essential hypertension - cont Losartan, Toprol XL --prn hydralazine   Dyslipidemia - continue statin therapy   Type 2 diabetes mellitus without complications (HCC) - on supplemental coverage with NovoLog. - will hold off metformin.   Gout - continue allopurinol.       DVT prophylaxis: IV heparin Advanced Care Planning:  Code Status: full code. Family communication :dter  Level of care: Progressive Status is: Inpatient Remains inpatient appropriate because: non-STEMI. Monitor one more night post cath  TOTAL TIME TAKING CARE OF THIS PATIENT: 35 minutes.  >50% time spent on counselling and coordination of care  Note: This dictation was prepared with Dragon dictation along with smaller phrase technology. Any transcriptional errors that result from this process are unintentional.  Enedina Finner M.D    [facility]   CC: Primary care physician; Dale Eatonton, MD

## 2022-11-24 NOTE — Telephone Encounter (Signed)
Pt daughter called stating pt had a heart attack on Friday and now she has been in the hospital since then.

## 2022-11-24 NOTE — Telephone Encounter (Signed)
Patients daughter was just calling to let us know she was in the hospital. I called daughter back (she is not on DPR so I could not discuss anything with her) but was able to speak with patient. Patient says she is doing good. She had her procedure done today and had one stent placed and planning to go home tomorrow. Pt thanked me for calling and checking on her.

## 2022-11-24 NOTE — Plan of Care (Signed)
  Problem: Education: Goal: Understanding of cardiac disease, CV risk reduction, and recovery process will improve Outcome: Progressing Goal: Individualized Educational Video(s) Outcome: Progressing   Problem: Activity: Goal: Ability to tolerate increased activity will improve Outcome: Progressing   Problem: Cardiac: Goal: Ability to achieve and maintain adequate cardiovascular perfusion will improve Outcome: Progressing   Problem: Health Behavior/Discharge Planning: Goal: Ability to safely manage health-related needs after discharge will improve Outcome: Progressing   Problem: Education: Goal: Ability to describe self-care measures that may prevent or decrease complications (Diabetes Survival Skills Education) will improve Outcome: Progressing Goal: Individualized Educational Video(s) Outcome: Progressing   Problem: Coping: Goal: Ability to adjust to condition or change in health will improve Outcome: Progressing   Problem: Fluid Volume: Goal: Ability to maintain a balanced intake and output will improve Outcome: Progressing   Problem: Health Behavior/Discharge Planning: Goal: Ability to identify and utilize available resources and services will improve Outcome: Progressing Goal: Ability to manage health-related needs will improve Outcome: Progressing   Problem: Metabolic: Goal: Ability to maintain appropriate glucose levels will improve Outcome: Progressing   Problem: Nutritional: Goal: Maintenance of adequate nutrition will improve Outcome: Progressing Goal: Progress toward achieving an optimal weight will improve Outcome: Progressing   Problem: Skin Integrity: Goal: Risk for impaired skin integrity will decrease Outcome: Progressing   Problem: Tissue Perfusion: Goal: Adequacy of tissue perfusion will improve Outcome: Progressing   Problem: Education: Goal: Understanding of CV disease, CV risk reduction, and recovery process will improve Outcome:  Progressing Goal: Individualized Educational Video(s) Outcome: Progressing   Problem: Activity: Goal: Ability to return to baseline activity level will improve Outcome: Progressing   Problem: Cardiovascular: Goal: Ability to achieve and maintain adequate cardiovascular perfusion will improve Outcome: Progressing Goal: Vascular access site(s) Level 0-1 will be maintained Outcome: Progressing   Problem: Health Behavior/Discharge Planning: Goal: Ability to safely manage health-related needs after discharge will improve Outcome: Progressing   Problem: Education: Goal: Knowledge of General Education information will improve Description: Including pain rating scale, medication(s)/side effects and non-pharmacologic comfort measures Outcome: Progressing   Problem: Health Behavior/Discharge Planning: Goal: Ability to manage health-related needs will improve Outcome: Progressing   Problem: Clinical Measurements: Goal: Ability to maintain clinical measurements within normal limits will improve Outcome: Progressing Goal: Will remain free from infection Outcome: Progressing Goal: Diagnostic test results will improve Outcome: Progressing Goal: Respiratory complications will improve Outcome: Progressing Goal: Cardiovascular complication will be avoided Outcome: Progressing   Problem: Activity: Goal: Risk for activity intolerance will decrease Outcome: Progressing   Problem: Nutrition: Goal: Adequate nutrition will be maintained Outcome: Progressing   Problem: Coping: Goal: Level of anxiety will decrease Outcome: Progressing   Problem: Elimination: Goal: Will not experience complications related to bowel motility Outcome: Progressing Goal: Will not experience complications related to urinary retention Outcome: Progressing   Problem: Pain Managment: Goal: General experience of comfort will improve Outcome: Progressing   Problem: Safety: Goal: Ability to remain free from  injury will improve Outcome: Progressing   Problem: Skin Integrity: Goal: Risk for impaired skin integrity will decrease Outcome: Progressing   

## 2022-11-24 NOTE — Progress Notes (Signed)
ANTICOAGULATION CONSULT NOTE  Pharmacy Consult for Heparin drip Indication: chest pain/ACS  Allergies  Allergen Reactions   Maxzide [Triamterene-Hctz] Other (See Comments)    Weakness/fatigue    Tape Other (See Comments)    Ob band aid adhesive  Pull skin off / Rash   Cephalexin Rash    Blotchiness to B lower legs after several days of cephalexin for UTI   Hydrochlorothiazide W-Triamterene     Other reaction(s): Other (See Comments) Extreme fatigue/weakness    Patient Measurements: Height: 5\' 6"  (167.6 cm) Weight: 95.3 kg (210 lb 1.6 oz) IBW/kg (Calculated) : 59.3 Heparin Dosing Weight: 80.5 kg  Vital Signs: Temp: 98.7 F (37.1 C) (07/15 0400) Temp Source: Oral (07/15 0400) BP: 148/67 (07/14 2000) Pulse Rate: 74 (07/15 0400)  Labs: Recent Labs    11/21/22 1741 11/21/22 1906 11/21/22 2005 11/22/22 0322 11/22/22 0454 11/22/22 1318 11/23/22 0442 11/23/22 0607 11/23/22 1404 11/24/22 0434  HGB 12.0  --   --   --  10.4*  --  10.2*  --   --  11.1*  HCT 39.1  --   --   --  33.5*  --  32.8*  --   --  35.7*  PLT 111*  --   --   --  113*  --  120*  --   --  135*  APTT  --  34  --   --   --   --   --   --   --   --   LABPROT  --  14.5  --   --   --   --   --   --   --   --   INR  --  1.1  --   --   --   --   --   --   --   --   HEPARINUNFRC  --   --   --    < >  --    < >  --  0.42 0.39 0.41  CREATININE 0.97  --   --   --  0.81  --   --   --   --   --   TROPONINIHS 13,770*  --  13,227*  --   --   --   --   --   --   --    < > = values in this interval not displayed.    Estimated Creatinine Clearance: 63.4 mL/min (by C-G formula based on SCr of 0.81 mg/dL).   Medical History: Past Medical History:  Diagnosis Date   Adenomatous colon polyp    Aortic atherosclerosis (HCC)    Barrett's esophagus    Cardiac murmur    CKD (chronic kidney disease), stage III (HCC)    Dyspnea on exertion    GERD (gastroesophageal reflux disease)    Gout    Hiatal hernia    History  of 2019 novel coronavirus disease (COVID-19) 08/2020   Hypercholesterolemia    Hypertension    ILD (interstitial lung disease) (HCC)    a.) 2/2 scleroderma Dx   LBBB (left bundle branch block)    Long term current use of antithrombotics/antiplatelets    a.) ASA + clopidogrel   Long term current use of immunosuppressive drug    a.) MTX for RA and scleroderma Dx   OSA on CPAP    Osteoarthritis    a.) knees, spine   PAD (peripheral artery disease) (HCC)    Phlebitis  a.) x 2 (with pregnancy)   Pulmonary fibrosis (HCC)    a.) mild   Pulmonary hypertension (HCC) 02/24/2012   a.) TTE 02/24/2012: EF 55%, RVSP 33; b.) TTE 10/12/2014: EF >55%, RVSP 36.1; c.) TTE 09/02/2017: EF 55%, RVSP 45.5   Reflux esophagitis    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 b.) renal US 05/23/2020: solid LEFT renal mass measuring 1.4 x 1.5 x 1.6. 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   unk at unk   allopurinol (ZYLOPRIM) 100 MG tablet Take 1 tablet (100 mg total) by mouth daily. 90 tablet 1 11/21/2022   aspirin 81 MG chewable tablet Chew 2 tablets (162 mg total) by mouth daily. 90 tablet 0 11/21/2022   atorvastatin (LIPITOR) 40 MG tablet Take 1 tablet by mouth once daily 90 tablet 2 11/21/2022   clopidogrel (PLAVIX)  75 MG tablet Take 1 tablet by mouth once daily with breakfast 90 tablet 0 11/21/2022   fexofenadine (ALLEGRA) 180 MG tablet Take 1 tablet (180 mg total) by mouth daily. (Patient taking differently: Take 180 mg by mouth daily as needed for allergies.) 30 tablet 1 unk at unk   folic acid (FOLVITE) 1 MG tablet Take 1 mg by mouth every evening.    Past Week   glucose blood (BAYER CONTOUR TEST) test strip USE  STRIP TO CHECK GLUCOSE TWICE DAILY. Dx: E11.9 100 each 12 unk at unk   ipratropium (ATROVENT) 0.06 % nasal spray Place 2 sprays into both nostrils 4 (four) times daily. (Patient taking differently: Place 2 sprays into both nostrils 4 (four) times daily as needed for rhinitis.) 15 mL 12 unk at unk   losartan (COZAAR) 100 MG tablet Take 1 tablet (100 mg total) by mouth daily. 90 tablet 0 11/21/2022   metFORMIN (GLUCOPHAGE) 500 MG tablet Take 1 tablet by mouth once daily 90 tablet 2 11/21/2022   methotrexate (RHEUMATREX) 2.5 MG tablet Take 10 mg by mouth every Wednesday.   Past Week   metoprolol succinate (TOPROL-XL) 50 MG 24 hr tablet Take 1 tablet (50 mg total) by mouth daily. 90 tablet 0 11/21/2022   MICROLET LANCETS MISC Check sugar once daily, [employer] Microlet Lancets. Dx E11.9 100 each 12 un at unk   pantoprazole (PROTONIX) 40 MG tablet Take 1 tablet by mouth once daily 90 tablet 3 11/21/2022   torsemide (DEMADEX) 20 MG tablet Take 20 mg by mouth as needed. Taking 1/2 tablet just as needed   unk at unk   celecoxib (CELEBREX) 200 MG capsule Take 1 capsule (200 mg total) by mouth 2 (two) times daily. (Patient not taking: Reported on 11/21/2022) 90 capsule 0 Not Taking   magnesium oxide (MAG-OX) 400 (240 Mg) MG tablet Take 1 tablet (400 mg  total) by mouth 2 (two) times daily. (Patient not taking: Reported on 11/21/2022) 60 tablet 2 Not Taking   mupirocin ointment (BACTROBAN) 2 % Apply 1 Application topically 2 (two) times daily. (Patient not taking: Reported on 11/21/2022) 22 g 0 Not Taking   traMADol  (ULTRAM) 50 MG tablet Take 1 tablet (50 mg total) by mouth every 4 (four) hours as needed for moderate pain. (Patient not taking: Reported on 11/21/2022) 30 tablet 0 Not Taking   triamcinolone cream (KENALOG) 0.1 % Apply 1 application topically 2 (two) times daily. To lower legs before putting on and/or after removing compression hose for redness in legs (Patient not taking: Reported on 11/21/2022) 45 g 0 Not Taking   trospium (SANCTURA) 20 MG tablet Take 1 tablet (20 mg total) by mouth at bedtime. (Patient not taking: Reported on 11/21/2022) 30 tablet 0 Not Taking   Scheduled:   allopurinol  100 mg Oral Daily   atorvastatin  80 mg Oral Daily   clopidogrel  75 mg Oral Q breakfast   folic acid  1 mg Oral QPM   insulin aspart  0-5 Units Subcutaneous QHS   insulin aspart  0-9 Units Subcutaneous TID WC   losartan  100 mg Oral Daily   [START ON 11/26/2022] methotrexate  10 mg Oral Q Wed   metoprolol succinate  75 mg Oral Daily   pantoprazole  40 mg Oral Daily   Infusions:   heparin 1,650 Units/hr (11/24/22 0400)    Assessment: 82 yo F to start Heparin drip for ACS/STEMi, elevated troponin On ASA/plavix PTA per med rec Hgb 12.0   Plt 111  INR 1.1  aPTT 34 Plan is to proceed with cardiac catheterization on 07/15  Goal of Therapy:  Heparin level 0.3-0.7 units/ml Monitor platelets by anticoagulation protocol: Yes  Plan: heparin level remains therapeutic (3rd consecutive) ---Continue heparin infusion at 1650 units/hr ---Recheck heparin level daily w/ AM labs ---CBC daily while on heparin Considering Cath for Monday 7/15  Otelia Sergeant, PharmD, [facility] 11/24/2022 5:43 AM

## 2022-11-24 NOTE — Care Management Important Message (Signed)
Important Message  Patient Details  Name: Nina Perez MRN: [id number] Date of Birth: [birth year]   Medicare Important Message Given:  Yes  Patient out of room for procedure upon time of visit.  Copy of Medicare IM left with family in room.   Johnell Comings 11/24/2022, 10:36 AM

## 2022-11-25 ENCOUNTER — Encounter: Payer: Self-pay | Admitting: Cardiovascular Disease

## 2022-11-25 ENCOUNTER — Other Ambulatory Visit: Payer: Self-pay

## 2022-11-25 DIAGNOSIS — Z955 Presence of coronary angioplasty implant and graft: Secondary | ICD-10-CM

## 2022-11-25 DIAGNOSIS — I214 Non-ST elevation (NSTEMI) myocardial infarction: Secondary | ICD-10-CM | POA: Diagnosis not present

## 2022-11-25 LAB — CBC
HCT: 36.9 % (ref 36.0–46.0)
Hemoglobin: 11.5 g/dL — ABNORMAL LOW (ref 12.0–15.0)
MCH: 30.4 pg (ref 26.0–34.0)
MCHC: 31.2 g/dL (ref 30.0–36.0)
MCV: 97.6 fL (ref 80.0–100.0)
Platelets: 129 10*3/uL — ABNORMAL LOW (ref 150–400)
RBC: 3.78 MIL/uL — ABNORMAL LOW (ref 3.87–5.11)
RDW: 19.7 % — ABNORMAL HIGH (ref 11.5–15.5)
WBC: 4.9 10*3/uL (ref 4.0–10.5)
nRBC: 0 % (ref 0.0–0.2)

## 2022-11-25 LAB — BASIC METABOLIC PANEL
Anion gap: 7 (ref 5–15)
BUN: 15 mg/dL (ref 8–23)
CO2: 23 mmol/L (ref 22–32)
Calcium: 9.3 mg/dL (ref 8.9–10.3)
Chloride: 104 mmol/L (ref 98–111)
Creatinine, Ser: 0.85 mg/dL (ref 0.44–1.00)
GFR, Estimated: >60 mL/min (ref >60)
Glucose, Bld: 89 mg/dL (ref 70–99)
Potassium: 4 mmol/L (ref 3.5–5.1)
Sodium: 134 mmol/L — ABNORMAL LOW (ref 135–145)

## 2022-11-25 LAB — GLUCOSE, CAPILLARY
Glucose-Capillary: 88 mg/dL (ref 70–99)
Glucose-Capillary: 162 mg/dL — ABNORMAL HIGH (ref 70–99)
Glucose-Capillary: 111 mg/dL — ABNORMAL HIGH (ref 70–99)
Glucose-Capillary: 126 mg/dL — ABNORMAL HIGH (ref 70–99)

## 2022-11-25 MED ORDER — AMLODIPINE BESYLATE 5 MG PO TABS
5.0000 mg | ORAL_TABLET | Freq: Every day | ORAL | Status: DC
  Administered 2022-11-25 – 2022-11-26 (×2): 5 mg via ORAL
  Filled 2022-11-25 (×2): qty 1

## 2022-11-25 MED ORDER — AMLODIPINE BESYLATE 5 MG PO TABS
5.0000 mg | ORAL_TABLET | Freq: Every day | ORAL | 1 refills | Status: DC
  Filled 2022-11-25: qty 30, 30d supply, fill #0

## 2022-11-25 MED ORDER — ATORVASTATIN CALCIUM 80 MG PO TABS
80.0000 mg | ORAL_TABLET | Freq: Every day | ORAL | 1 refills | Status: DC
  Filled 2022-11-25: qty 30, 30d supply, fill #0

## 2022-11-25 MED ORDER — ASPIRIN 81 MG PO TBEC
81.0000 mg | DELAYED_RELEASE_TABLET | Freq: Every day | ORAL | 12 refills | Status: AC
  Filled 2022-11-25: qty 30, 30d supply, fill #0

## 2022-11-25 MED ORDER — NITROGLYCERIN 0.4 MG SL SUBL
0.4000 mg | SUBLINGUAL_TABLET | SUBLINGUAL | 12 refills | Status: AC | PRN
  Filled 2022-11-25: qty 25, 8d supply, fill #0

## 2022-11-25 MED ORDER — CLOPIDOGREL BISULFATE 75 MG PO TABS
75.0000 mg | ORAL_TABLET | Freq: Every day | ORAL | 1 refills | Status: DC
  Filled 2022-11-25: qty 30, 30d supply, fill #0

## 2022-11-25 MED ORDER — METOPROLOL SUCCINATE ER 100 MG PO TB24
100.0000 mg | ORAL_TABLET | Freq: Every day | ORAL | 1 refills | Status: DC
  Filled 2022-11-25: qty 30, 30d supply, fill #0

## 2022-11-25 MED ORDER — ASPIRIN 81 MG PO TBEC
81.0000 mg | DELAYED_RELEASE_TABLET | Freq: Every day | ORAL | Status: DC
  Administered 2022-11-25 – 2022-11-27 (×3): 81 mg via ORAL
  Filled 2022-11-25 (×3): qty 1

## 2022-11-25 NOTE — TOC Progression Note (Signed)
Transition of Care [facility]) - Progression Note    Patient Details  Name: Nina Perez MRN: [id number] Date of Birth: Jan 08, 1941  Transition of Care [facility]) CM/SW Contact  Darolyn Rua, [place] Phone Number: 11/25/2022, 3:06 PM  Clinical Narrative:     Patient to discharge home today however per RN and mobility tech patient was requesting 2-3 assistance, PT actively working with patient now. Per daughter she reports noticing increased needs for patient during ambulation and is in agreement with SNF no preference of facility however [facility] closest to their home.   CSW has sent out referrals pending bed offers, MD updated.        Expected Discharge Plan and Services         Expected Discharge Date: 11/25/22                                     Social Determinants of Health (SDOH) Interventions SDOH Screenings   Food Insecurity: No Food Insecurity (11/22/2022)  Housing: Low Risk  (11/22/2022)  Transportation Needs: No Transportation Needs (11/22/2022)  Utilities: Not At Risk (11/22/2022)  Depression (PHQ2-9): Low Risk  (07/14/2022)  Financial Resource Strain: Low Risk  (11/20/2021)  Physical Activity: Sufficiently Active (11/20/2021)  Social Connections: Socially Integrated (11/20/2021)  Stress: No Stress Concern Present (11/20/2021)  Tobacco Use: Medium Risk (11/22/2022)    Readmission Risk Interventions     No data to display

## 2022-11-25 NOTE — Progress Notes (Addendum)
Physical Therapy Evaluation Patient Details Name: Nina Perez MRN: [id number] DOB: 03/27/1941 Today's Date: 11/25/2022  History of Present Illness  Nina Perez is a 82 y.o. Caucasian female with medical history significant for stage III chronic kidney disease, GERD, with hiatal hernia, hypertension, dyslipidemia, ILD, left bundle branch block, PAD, RA, and type 2 diabetes mellitus, who presented to the emergency room with acute onset of left  chest tightness with associated palpitations and radiation to both arms.   Clinical Impression  Patient supine in bed with daughter at bedside upon arrival; agreeable to PT evaluation. Per daughter reports, patient's functional baseline was Mod I with a RW for household and community distance and [place] I with ADLs. On evaluation this date, patient requiring Max A with bed mobility; and Max A +2 for transfer, difficulty maintaining full upright due pain in L Knee. Patient currently presenting with decreased strength, pain, impaired mobility, and decreased activity tolerance. Patient will benefit from skilled acute PT services to address impairments (see below for details), and improve mobility to return to PLOF.       Assistance Recommended at Discharge Frequent or constant Supervision/Assistance  If plan is discharge home, recommend the following:  Can travel by private vehicle  Two people to help with walking and/or transfers;Two people to help with bathing/dressing/bathroom;Assistance with cooking/housework;Assist for transportation;Help with stairs or ramp for entrance   No    Equipment Recommendations Other (comment) (to be determined at next venue of care)  Recommendations for Other Services  OT consult    Functional Status Assessment Patient has had a recent decline in their functional status and demonstrates the ability to make significant improvements in function in a reasonable and predictable amount of time.     Precautions / Restrictions  Precautions Precautions: None Restrictions Weight Bearing Restrictions: No      Mobility  Bed Mobility Overal bed mobility: Needs Assistance Bed Mobility: Rolling, Sidelying to Sit, Sit to Supine Rolling: Mod assist Sidelying to sit: Max assist   Sit to supine: Max assist   General bed mobility comments: Mod A to roll to L side with use of bed rail; assist to bring legs to EOB. Max A to sit upright, and Max A to return to supine with assist at trunk and BLE's    Transfers Overall transfer level: Needs assistance Equipment used: Rolling walker (2 wheels) Transfers: Sit to/from Stand Sit to Stand: Max assist, +2 physical assistance           General transfer comment: with bed slightly elevated, completed Max A + 2 to attempt to stand. Able to clear hips, patietn unable to extend knees fully to obtain upright position. Trialed 2 reps, assist for hand placement.    Ambulation/Gait               General Gait Details: not attempted; unsafe this date  Stairs            Wheelchair Mobility     Tilt Bed    Modified Rankin (Stroke Patients Only)       Balance Overall balance assessment: Needs assistance Sitting-balance support: Bilateral upper extremity supported, Feet supported Sitting balance-Leahy Scale: Fair Sitting balance - Comments: mild posterior lean intermittent, improvemetns with cues for forward lean and positioning   Standing balance support: Bilateral upper extremity supported, During functional activity, Reliant on assistive device for balance Standing balance-Leahy Scale: Poor Standing balance comment: posterior lean, difficult obtaining upright position  Pertinent Vitals/Pain Pain Assessment Pain Assessment: No/denies pain    Home Living Family/patient expects to be discharged to:: Private residence Living Arrangements: Spouse/significant other Available Help at Discharge: Family Type of Home:  House Home Access: Ramped entrance       Home Layout: Two level;Able to live on main level with bedroom/bathroom Home Equipment: Rolling Walker (2 wheels);Rollator (4 wheels);Shower seat Additional Comments: Per daughter reports, multi level home with ramped entrance. Can live on main level.    Prior Function Prior Level of Function : Independent/Modified Independent             Mobility Comments: Per daughter reports, Mod I with use of RW PTA. ADLs Comments: Per daughter report Mod I with ADLs     Hand Dominance   Dominant Hand: Right    Extremity/Trunk Assessment        Lower Extremity Assessment Lower Extremity Assessment: RLE deficits/detail;LLE deficits/detail RLE Deficits / Details: generalized weakness in RLE RLE Sensation: WNL LLE Deficits / Details: increased weakness on LLE, minimal tolerance due to pain in knee LLE: Unable to fully assess due to pain LLE Sensation: WNL       Communication   Communication: No difficulties  Cognition Arousal/Alertness: Awake/alert Behavior During Therapy: WFL for tasks assessed/performed Overall Cognitive Status: Within Functional Limits for tasks assessed                                          General Comments      Exercises     Assessment/Plan    PT Assessment Patient needs continued PT services  PT Problem List Decreased strength;Decreased activity tolerance;Decreased balance;Decreased mobility;Pain       PT Treatment Interventions DME instruction;Gait training;Functional mobility training;Therapeutic activities;Therapeutic exercise;Neuromuscular re-education;Balance training;Patient/family education    PT Goals (Current goals can be found in the Care Plan section)  Acute Rehab PT Goals Patient Stated Goal: get back home PT Goal Formulation: With patient/family Time For Goal Achievement: 12/09/22 Potential to Achieve Goals: Good    Frequency Min 1X/week     Co-evaluation                AM-PAC PT "6 Clicks" Mobility  Outcome Measure Help needed turning from your back to your side while in a flat bed without using bedrails?: A Lot Help needed moving from lying on your back to sitting on the side of a flat bed without using bedrails?: A Lot Help needed moving to and from a bed to a chair (including a wheelchair)?: A Lot Help needed standing up from a chair using your arms (e.g., wheelchair or bedside chair)?: A Lot Help needed to walk in hospital room?: Total Help needed climbing 3-5 steps with a railing? : Total 6 Click Score: 10    End of Session Equipment Utilized During Treatment: Gait belt Activity Tolerance: Patient limited by pain;Patient limited by fatigue Patient left: in bed;with bed alarm set;with family/visitor present Nurse Communication: Mobility status PT Visit Diagnosis: Unsteadiness on feet (R26.81);Other abnormalities of gait and mobility (R26.89);Muscle weakness (generalized) (M62.81);Pain Pain - Right/Left: Left Pain - part of body: Knee    Time: 1610-9604 PT Time Calculation (min) (ACUTE ONLY): 20 min   Charges:   PT Evaluation $PT Eval Moderate Complexity: 1 Mod   PT General Charges $$ ACUTE PT VISIT: 1 Visit         Creed Copper Fairly, PT,  DPT 11/25/22 4:00 PM

## 2022-11-25 NOTE — Progress Notes (Signed)
[facility]  - [facility]   PATIENT NAME: Nina Perez    MR#:  [id number]  DATE OF BIRTH:  [birth year]  SUBJECTIVE:  patient seen earlier. Daughter and husband at bedside. Came in with  chest pain since past Thursday. Gotten worse came in and was ruled in for non-STEMI. Troponin was 13,000.   S/p cath with PCI stent M2 branch of RCA patient had significant weakness as noted by RN when they were trying to get her up. Will await PT eval. Patient denies any chest pain. She is very eager to go home.  VITALS:  Blood pressure (!) 155/65, pulse 74, temperature 98.2 F (36.8 C), temperature source Oral, resp. rate 18, height 5\' 6"  (1.676 m), weight 108.6 kg, last menstrual period 05/25/1988, SpO2 92%.  PHYSICAL EXAMINATION:   GENERAL:  82 y.o.-year-old patient with no acute distress.  LUNGS: Normal breath sounds bilaterally, no wheezing CARDIOVASCULAR: S1, S2 normal. No murmur   ABDOMEN: Soft, nontender, nondistended. Bowel sounds present.  EXTREMITIES: No  edema b/l.    NEUROLOGIC: nonfocal  patient is alert and awake SKIN: No obvious rash, lesion, or ulcer.   LABORATORY PANEL:  CBC Recent Labs  Lab 11/25/22 0611  WBC 4.9  HGB 11.5*  HCT 36.9  PLT 129*    Chemistries  Recent Labs  Lab 11/25/22 0611  NA 134*  K 4.0  CL 104  CO2 23  GLUCOSE 89  BUN 15  CREATININE 0.85  CALCIUM 9.3   Cardiac Enzymes No results for input(s): "TROPONINI" in the last 168 hours. RADIOLOGY:  CARDIAC CATHETERIZATION  Result Date: 11/24/2022   RV Branch lesion is 90% stenosed.   Prox RCA lesion is 40% stenosed.   1st Mrg lesion is 55% stenosed.   1st Diag lesion is 60% stenosed.   The left ventricular systolic function is normal.   LV end diastolic pressure is mildly elevated.   The left ventricular ejection fraction is 50-55% by visual estimate.   There is trivial (1+) mitral regurgitation.   Recommend dual antiplatelet therapy with Aspirin 81mg  daily and Clopidogrel  75mg  daily. Patient had non-STEMI with troponin as high as 13,000.  Culprit lesion probably is marginal branch of right coronary artery as there is a high-grade lesion about 90%.  Advise PCI and stenting of the marginal branch of right coronary artery.  LVEF was 53% on echocardiogram with wall motion abnormality in the septum as well as anterior wall most likely due to left bundle branch block.   CARDIAC CATHETERIZATION  Result Date: 11/24/2022   1st Mrg lesion is 70% stenosed.   2nd Diag lesion is 80% stenosed.   RV Branch lesion is 90% stenosed.   A drug-eluting stent was successfully placed using a STENT ONYX FRONTIER 2.75X22.   Post intervention, there is a 0% residual stenosis. Successful angioplasty and drug-eluting stent placement to proximal RV marginal of the right coronary artery which is large in size and supplies the right PDA distribution. Recommendations: Dual antiplatelet therapy for 12 months. Aggressive treatment of risk factors. The second diagonal is diseased but relatively small in size.  No PCI is recommended. I increased Toprol for better blood pressure control.    Assessment and Plan  Nina Perez is a 82 y.o. Caucasian female with medical history significant for stage III chronic kidney disease, GERD, with hiatal hernia, hypertension, dyslipidemia, ILD, left bundle branch block, PAD, RA, and type 2 diabetes mellitus, who presented to the emergency room with acute  onset of left parasternal chest tightness graded 5/10 in severity with associated palpitations and radiation to both arms.    High sensitive troponin I was 13,770 and later on 13,227   EKG showed normal sinus rhythm with a rate of 79 with left bundle branch block.   NSTEMI (non-ST elevated myocardial infarction) (HCC) - continue IV heparin drip. - as needed sublingual nitroglycerin and IV morphine sulfate for pain. -  continue IV nitro drip--titrate for chest pain and BP - She will be placed on high-dose statin  therapy as well as aspirin.   --Will continue Plavix. - Beta-blocker therapy will be continued. - 2D echo be obtained. - Cardiology consult with [facility]--seen by Dr Melynda Ripple on Monday. Pt and dter aware --7/14-- Off nitroglycerin gtt. Cont heparin gtt. NPO tonite --7/15-- s/p cath by Drs khan/Arida--   1st Mrg lesion is 70% stenosed.    2nd Diag lesion is 80% stenosed.    RV Branch lesion is 90% stenosed.    A drug-eluting stent was successfully placed using a STENT ONYX  FRONTIER 2.75X22.    Post intervention, there is a 0% residual stenosis.  Successful angioplasty and drug-eluting stent placement to proximal RV  marginal of the right coronary artery which is large in size and supplies  the right PDA distribution.  --now on Asa +plavix --pt and family request f/u [facility] cardiology group. Message sent for appt   Essential hypertension - cont Losartan, Toprol XL --prn hydralazine   Dyslipidemia - continue statin therapy   Type 2 diabetes mellitus without complications (HCC) - on supplemental coverage with NovoLog. - will hold off metformin for 48 hours   Gout - continue allopurinol.   Generalized weakness/debility -- patient will be seen by physical therapy.    DVT prophylaxis: IV heparin Advanced Care Planning:  Code Status: full code. Family communication :dter  Level of care: Progressive Status is: Inpatient Remains inpatient appropriate because: non-STEMI. Normalized weakness. Await PT eval. TOTAL TIME TAKING CARE OF THIS PATIENT: 35 minutes.  >50% time spent on counselling and coordination of care  Note: This dictation was prepared with Dragon dictation along with smaller phrase technology. Any transcriptional errors that result from this process are unintentional.  Enedina Finner M.D    [facility]   CC: Primary care physician; Dale [place], MD

## 2022-11-25 NOTE — [place] FL2 (Signed)
[place] MEDICAID FL2 LEVEL OF CARE FORM     IDENTIFICATION  Patient Name: Nina Perez Birthdate: Oct 13, 1940 Sex: female Admission Date (Current Location): 11/21/2022  [facility] [place] Number:  Chiropodist and Address:  [facility], [address] [place], [place] [address]      Provider Number: [id number]  Attending Physician Name and Address:  Enedina Finner, MD  Relative Name and Phone Number:  Sue Lush [phone]    Current Level of Care: Hospital Recommended Level of Care: Skilled Nursing Facility Prior Approval Number:    Date Approved/Denied:   PASRR Number: [id number]  Discharge Plan: SNF    Current Diagnoses: Patient Active Problem List   Diagnosis Date Noted   Dyslipidemia 11/22/2022   Essential hypertension 11/22/2022   NSTEMI (non-ST elevated myocardial infarction) (HCC) 11/21/2022   Cardiomyopathy (HCC) 11/17/2022   Balance problem 11/17/2022   Ear lesion 04/20/2022   Change in bowel movement 04/20/2022   History of rectal bleeding 11/13/2021   Total knee replacement status 11/13/2021   Knee pain 08/21/2021   Occult blood positive stool 07/04/2021   Rectal bleeding 06/26/2021   Redness of skin 04/09/2021   Aortic atherosclerosis (HCC) 01/20/2021   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   Nocturia 12/18/2020   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) 10/08/2020   Abnormal ECG 08/06/2020   Premature beats 07/03/2020   Dysuria 12/11/2019   Adenomatous colon polyp 11/21/2019   Phlebitis 11/21/2019   Renal cyst 11/21/2019   Swelling of lower extremity 10/26/2019   Hypomagnesemia 06/03/2019   CKD (chronic kidney disease) stage 3, GFR 30-59 ml/min (HCC) 06/02/2019   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) 06/01/2019   Atherosclerosis of native arteries of extremity with rest pain (HCC) 05/29/2019   Critical lower limb ischemia (HCC) 05/16/2019   Open wound of left foot 03/27/2019   Decreased GFR 11/13/2018   Bladder  incontinence 11/13/2018   Elevated TSH 11/13/2018   Thrombocytopenia (HCC) 07/03/2018   Acute right-sided thoracic back pain 06/16/2018   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   Pulmonary hypertension, mild (HCC) 03/04/2018   Murmur 08/16/2017   Lymphedema 03/04/2016   Lesion of neck 12/15/2015   Dizziness 12/15/2015   Venous stasis dermatitis of right lower extremity 09/10/2015   Varicose veins of right lower extremity with inflammation 09/10/2015   Breast nodule 11/05/2014   Health care maintenance 11/02/2014   Obesity (BMI 30-39.9) 06/04/2014   Osteoarthritis 02/27/2014   Environmental allergies 01/15/2014   Diabetes mellitus (HCC) 05/22/2012   Hypertension 05/22/2012   Hypercholesterolemia 05/22/2012   Rheumatoid arthritis involving multiple sites with positive rheumatoid factor (HCC) 05/22/2012   Barrett's esophagus 05/22/2012   Gout 05/22/2012   GERD (gastroesophageal reflux disease) 05/22/2012   History of colon polyps 05/22/2012   Type 2 diabetes mellitus without complications (HCC) 05/22/2012    Orientation RESPIRATION BLADDER Height & Weight     Self, Time, Situation, Place  Normal Incontinent, External catheter Weight: 239 lb 6.7 oz (108.6 kg) Height:  5\' 6"  (167.6 cm)  BEHAVIORAL SYMPTOMS/MOOD NEUROLOGICAL BOWEL NUTRITION STATUS      Continent Diet (see discharge summary)  AMBULATORY STATUS COMMUNICATION OF NEEDS Skin   Extensive Assist Verbally Normal                       Personal Care Assistance Level of Assistance  Bathing, Feeding, Dressing, Total care Bathing Assistance: Maximum assistance Feeding assistance: Limited assistance Dressing Assistance: Maximum assistance Total Care  Assistance: Maximum assistance   Functional Limitations Info  Sight, Hearing, Speech Sight Info: Adequate Hearing Info: Adequate Speech Info: Adequate    SPECIAL CARE FACTORS FREQUENCY  PT (By licensed PT), OT (By licensed OT)     PT Frequency: min 4x  weekly OT Frequency: min 4x weekly            Contractures Contractures Info: Not present    Additional Factors Info  Code Status, Allergies Code Status Info: full Allergies Info: Maxzide (Triamterene-hctz)  Tape  Cephalexin  Hydrochlorothiazide W-triamterene           Current Medications (11/25/2022):  This is the current hospital active medication list Current Facility-Administered Medications  Medication Dose Route Frequency Provider Last Rate Last Admin   0.9 %  sodium chloride infusion  250 mL Intravenous PRN Iran Ouch, MD       acetaminophen (TYLENOL) tablet 650 mg  650 mg Oral Q4H PRN Iran Ouch, MD       allopurinol (ZYLOPRIM) tablet 100 mg  100 mg Oral Daily Lorine Bears A, MD   100 mg at 11/25/22 0856   ALPRAZolam (XANAX) tablet 0.25 mg  0.25 mg Oral BID PRN Iran Ouch, MD       amLODipine (NORVASC) tablet 5 mg  5 mg Oral Daily Hudson, [place], PA-C   5 mg at 11/25/22 1006   aspirin EC tablet 81 mg  81 mg Oral Daily Hudson, Caralyn, PA-C   81 mg at 11/25/22 0857   atorvastatin (LIPITOR) tablet 80 mg  80 mg Oral Daily Lorine Bears A, MD   80 mg at 11/25/22 0856   clopidogrel (PLAVIX) tablet 75 mg  75 mg Oral Q breakfast Lorine Bears A, MD   75 mg at 11/25/22 0856   enoxaparin (LOVENOX) injection 50 mg  50 mg Subcutaneous Q24H Lorine Bears A, MD   50 mg at 11/25/22 0857   folic acid (FOLVITE) tablet 1 mg  1 mg Oral QPM Lorine Bears A, MD   1 mg at 11/24/22 1758   hydrALAZINE (APRESOLINE) tablet 25 mg  25 mg Oral Q6H PRN Iran Ouch, MD   25 mg at 11/22/22 1834   insulin aspart (novoLOG) injection 0-5 Units  0-5 Units Subcutaneous QHS Arida, Muhammad A, MD       insulin aspart (novoLOG) injection 0-9 Units  0-9 Units Subcutaneous TID WC Iran Ouch, MD   2 Units at 11/25/22 1223   loratadine (CLARITIN) tablet 10 mg  10 mg Oral Daily PRN Iran Ouch, MD       losartan (COZAAR) tablet 100 mg  100 mg Oral Daily Lorine Bears A, MD   100 mg at 11/25/22 0856   magnesium hydroxide (MILK OF MAGNESIA) suspension 30 mL  30 mL Oral Daily PRN Iran Ouch, MD       [START ON 11/26/2022] methotrexate (RHEUMATREX) tablet 10 mg  10 mg Oral Q Wed Arida, Muhammad A, MD       metoprolol succinate (TOPROL-XL) 24 hr tablet 100 mg  100 mg Oral Daily Lorine Bears A, MD   100 mg at 11/25/22 0858   nitroGLYCERIN (NITROSTAT) SL tablet 0.4 mg  0.4 mg Sublingual Q5 Min x 3 PRN Iran Ouch, MD       ondansetron (ZOFRAN) injection 4 mg  4 mg Intravenous Q6H PRN Iran Ouch, MD       pantoprazole (PROTONIX) EC tablet 40 mg  40 mg Oral Daily  Iran Ouch, MD   40 mg at 11/25/22 0859   sodium chloride flush (NS) 0.9 % injection 3 mL  3 mL Intravenous Q12H Lorine Bears A, MD   3 mL at 11/25/22 0902   sodium chloride flush (NS) 0.9 % injection 3 mL  3 mL Intravenous PRN Iran Ouch, MD       torsemide (DEMADEX) tablet 10 mg  10 mg Oral PRN Iran Ouch, MD       traZODone (DESYREL) tablet 25 mg  25 mg Oral QHS PRN Iran Ouch, MD         Discharge Medications: Please see discharge summary for a list of discharge medications.  Relevant Imaging Results:  Relevant Lab Results:   Additional Information SSN:[id number]  Darolyn Rua, LCSW

## 2022-11-25 NOTE — Progress Notes (Addendum)
[facility] CLINIC CARDIOLOGY PROGRESS NOTE   Patient ID: Nina Perez MRN: [id number] DOB/AGE: Aug 05, 1940 82 y.o.  Admit date: 11/21/2022 Referring Physician Dr. Valente David Primary Physician Dr. Dale [place] Primary Cardiologist Minda Ditto, [place] Reason for Consultation NSTEMI  HPI: Nina Perez is a 82 y.o. female with a past medical history of PVD, hypertension, hyperlipidemia, LBBB, CKD stage III, OSA who presented to the ED on 11/21/2022 for chest tightness. Troponin in the ED 13,770, no significant EKG abnormalities. Cardiology consulted for further assistance of NSTEMI.   Interval History:  -Patient states she is feeling well this AM, denies CP, SOB.  -Access site for cath with mild bruising, no pain.  -BP remains elevated in the 160-170s systolic.   Review of systems complete and found to be negative unless listed above    Vitals:   11/24/22 2314 11/25/22 0423 11/25/22 0431 11/25/22 0857  BP: (!) 167/72  (!) 177/84 (!) 181/90  Pulse: 77  75 73  Resp: 18  18 18   Temp: 98.6 F (37 C)  99.2 F (37.3 C) 98.4 F (36.9 C)  TempSrc: Oral  Oral Oral  SpO2: 93%  93% 93%  Weight:  108.6 kg    Height:         Intake/Output Summary (Last 24 hours) at 11/25/2022 0915 Last data filed at 11/25/2022 0435 Gross per 24 hour  Intake 843.27 ml  Output 775 ml  Net 68.27 ml     PHYSICAL EXAM General: Well appearing elderly female, well nourished, in no acute distress sitting upright in hospital bed. HEENT: Normocephalic and atraumatic. Neck: No JVD.  Lungs: Normal respiratory effort on room air. Clear bilaterally to auscultation. No wheezes, crackles, rhonchi.  Heart: HRRR. Normal S1 and S2 without gallops or murmurs. Radial & DP pulses 2+ bilaterally. Abdomen: Non-distended appearing.  Msk: Normal strength and tone for age. Extremities: No clubbing, cyanosis or edema. R groin access site with mild ecchymosis, non-tender, dressing CDI with no oozing.  Neuro: Alert and oriented X  3. Psych: Mood appropriate, affect congruent.    LABS: Basic Metabolic Panel: Recent Labs    11/24/22 0433 11/25/22 0611  NA  --  134*  K  --  4.0  CL  --  104  CO2  --  23  GLUCOSE  --  89  BUN  --  15  CREATININE 0.76 0.85  CALCIUM  --  9.3   Liver Function Tests: No results for input(s): "AST", "ALT", "ALKPHOS", "BILITOT", "PROT", "ALBUMIN" in the last 72 hours. No results for input(s): "LIPASE", "AMYLASE" in the last 72 hours. CBC: Recent Labs    11/24/22 0434 11/25/22 0611  WBC 3.8* 4.9  HGB 11.1* 11.5*  HCT 35.7* 36.9  MCV 96.5 97.6  PLT 135* 129*   Cardiac Enzymes: No results for input(s): "CKTOTAL", "CKMB", "CKMBINDEX", "TROPONINIHS" in the last 72 hours. BNP: No results for input(s): "BNP" in the last 72 hours. D-Dimer: No results for input(s): "DDIMER" in the last 72 hours. Hemoglobin A1C: No results for input(s): "HGBA1C" in the last 72 hours. Fasting Lipid Panel: No results for input(s): "CHOL", "HDL", "LDLCALC", "TRIG", "CHOLHDL", "LDLDIRECT" in the last 72 hours. Thyroid Function Tests: No results for input(s): "TSH", "T4TOTAL", "T3FREE", "THYROIDAB" in the last 72 hours.  Invalid input(s): "FREET3" Anemia Panel: No results for input(s): "VITAMINB12", "FOLATE", "FERRITIN", "TIBC", "IRON", "RETICCTPCT" in the last 72 hours.  CARDIAC CATHETERIZATION  Result Date: 11/24/2022   RV Branch lesion is 90% stenosed.   Prox  RCA lesion is 40% stenosed.   1st Mrg lesion is 55% stenosed.   1st Diag lesion is 60% stenosed.   The left ventricular systolic function is normal.   LV end diastolic pressure is mildly elevated.   The left ventricular ejection fraction is 50-55% by visual estimate.   There is trivial (1+) mitral regurgitation.   Recommend dual antiplatelet therapy with Aspirin 81mg  daily and Clopidogrel 75mg  daily. Patient had non-STEMI with troponin as high as 13,000.  Culprit lesion probably is marginal branch of right coronary artery as there is a  high-grade lesion about 90%.  Advise PCI and stenting of the marginal branch of right coronary artery.  LVEF was 53% on echocardiogram with wall motion abnormality in the septum as well as anterior wall most likely due to left bundle branch block.   CARDIAC CATHETERIZATION  Result Date: 11/24/2022   1st Mrg lesion is 70% stenosed.   2nd Diag lesion is 80% stenosed.   RV Branch lesion is 90% stenosed.   A drug-eluting stent was successfully placed using a STENT ONYX FRONTIER 2.75X22.   Post intervention, there is a 0% residual stenosis. Successful angioplasty and drug-eluting stent placement to proximal RV marginal of the right coronary artery which is large in size and supplies the right PDA distribution. Recommendations: Dual antiplatelet therapy for 12 months. Aggressive treatment of risk factors. The second diagonal is diseased but relatively small in size.  No PCI is recommended. I increased Toprol for better blood pressure control.   ECHOCARDIOGRAM COMPLETE  Result Date: 11/23/2022    ECHOCARDIOGRAM REPORT   Patient Name:   Nina Perez Date of Exam: 11/23/2022 Medical Rec #:  [id number]    Height:       66.0 in Accession #:    [id number]   Weight:       210.1 lb Date of Birth:  14-Aug-1940    BSA:          2.042 m Patient Age:    82 years     BP:           168/68 mmHg Patient Gender: F            HR:           77 bpm. Exam Location:  [facility] Procedure: 2D Echo and Intracardiac Opacification Agent Indications:     NSTEMI I21.4  History:         Patient has no prior history of Echocardiogram examinations.  Sonographer:     Overton Mam RDCS, FASE Referring Phys:  [id number] [name] Diagnosing Phys: [name]  Sonographer Comments: Technically difficult study due to poor echo windows and no subcostal window. Image acquisition challenging due to patient body habitus. IMPRESSIONS  1. Left ventricular ejection fraction, by estimation, is 50 to 55%. The left ventricle has low normal function. The left  ventricle demonstrates regional wall motion abnormalities (see scoring diagram/findings for description). Left ventricular diastolic  parameters are consistent with Grade III diastolic dysfunction (restrictive).  2. Right ventricular systolic function is normal. The right ventricular size is mildly enlarged.  3. Left atrial size was moderately dilated.  4. Right atrial size was moderately dilated.  5. The mitral valve is normal in structure. Mild mitral valve regurgitation. No evidence of mitral stenosis.  6. The aortic valve is normal in structure. Aortic valve regurgitation is mild. Aortic valve sclerosis is present, with no evidence of aortic valve stenosis.  7. The inferior vena cava is normal in  size with greater than 50% respiratory variability, suggesting right atrial pressure of 3 mmHg. FINDINGS  Left Ventricle: Left ventricular ejection fraction, by estimation, is 50 to 55%. The left ventricle has low normal function. The left ventricle demonstrates regional wall motion abnormalities. Definity contrast agent was given IV to delineate the left ventricular endocardial borders. The left ventricular internal cavity size was normal in size. There is no left ventricular hypertrophy. Left ventricular diastolic parameters are consistent with Grade III diastolic dysfunction (restrictive).  LV Wall Scoring: The inferior septum is akinetic. The anterior septum is hypokinetic. Right Ventricle: The right ventricular size is mildly enlarged. No increase in right ventricular wall thickness. Right ventricular systolic function is normal. Left Atrium: Left atrial size was moderately dilated. Right Atrium: Right atrial size was moderately dilated. Pericardium: There is no evidence of pericardial effusion. Mitral Valve: The mitral valve is normal in structure. Mild mitral valve regurgitation. No evidence of mitral valve stenosis. Tricuspid Valve: The tricuspid valve is normal in structure. Tricuspid valve regurgitation is  mild . No evidence of tricuspid stenosis. Aortic Valve: The aortic valve is normal in structure. Aortic valve regurgitation is mild. Aortic valve sclerosis is present, with no evidence of aortic valve stenosis. Aortic valve mean gradient measures 11.5 mmHg. Aortic valve peak gradient measures 19.3 mmHg. Aortic valve area, by VTI measures 1.21 cm. Pulmonic Valve: The pulmonic valve was normal in structure. Pulmonic valve regurgitation is not visualized. No evidence of pulmonic stenosis. Aorta: The aortic root is normal in size and structure. Venous: The inferior vena cava is normal in size with greater than 50% respiratory variability, suggesting right atrial pressure of 3 mmHg. IAS/Shunts: No atrial level shunt detected by color flow Doppler.  LEFT VENTRICLE PLAX 2D LVIDd:         5.30 cm      Diastology LVIDs:         3.90 cm      LV e' medial:    5.44 cm/s LV PW:         1.45 cm      LV E/e' medial:  17.8 LV IVS:        1.15 cm      LV e' lateral:   5.00 cm/s LVOT diam:     1.80 cm      LV E/e' lateral: 19.4 LV SV:         55 LV SV Index:   27 LVOT Area:     2.54 cm  LV Volumes (MOD) LV vol d, MOD A2C: 158.0 ml LV vol d, MOD A4C: 115.8 ml LV vol s, MOD A2C: 62.0 ml LV vol s, MOD A4C: 55.5 ml LV SV MOD A2C:     96.0 ml LV SV MOD A4C:     115.8 ml LV SV MOD BP:      75.9 ml RIGHT VENTRICLE RV Basal diam:  3.10 cm RV S prime:     17.00 cm/s TAPSE (M-mode): 2.9 cm LEFT ATRIUM             Index        RIGHT ATRIUM           Index LA diam:        5.00 cm 2.45 cm/m   RA Area:     10.30 cm LA Vol (A2C):   44.7 ml 21.89 ml/m  RA Volume:   21.30 ml  10.43 ml/m LA Vol (A4C):   74.6 ml 36.53 ml/m LA Biplane Vol:  60.8 ml 29.77 ml/m  AORTIC VALVE                     PULMONIC VALVE AV Area (Vmax):    1.34 cm      PV Vmax:       1.25 m/s AV Area (Vmean):   1.31 cm      PV Peak grad:  6.2 mmHg AV Area (VTI):     1.21 cm AV Vmax:           219.67 cm/s AV Vmean:          157.500 cm/s AV VTI:            0.459 m AV Peak  Grad:      19.3 mmHg AV Mean Grad:      11.5 mmHg LVOT Vmax:         116.00 cm/s LVOT Vmean:        80.800 cm/s LVOT VTI:          0.218 m LVOT/AV VTI ratio: 0.47  AORTA Ao Root diam: 3.40 cm Ao Asc diam:  2.90 cm MITRAL VALVE MV Area (PHT): 5.79 cm     SHUNTS MV Decel Time: 131 msec     Systemic VTI:  0.22 m MV E velocity: 96.90 cm/s   Systemic Diam: 1.80 cm MV A velocity: 149.00 cm/s MV E/A ratio:  0.65 Shaukat Khan Electronically signed by [name] Signature Date/Time: 11/23/2022/1:02:50 PM    Final      ECHO 11/23/2022:  1. Left ventricular ejection fraction, by estimation, is 50 to 55%. The  left ventricle has low normal function. The left ventricle demonstrates  regional wall motion abnormalities (see scoring diagram/findings for  description). Left ventricular diastolic  parameters are consistent with Grade III diastolic dysfunction (restrictive).   2. Right ventricular systolic function is normal. The right ventricular  size is mildly enlarged.   3. Left atrial size was moderately dilated.   4. Right atrial size was moderately dilated.   5. The mitral valve is normal in structure. Mild mitral valve  regurgitation. No evidence of mitral stenosis.   6. The aortic valve is normal in structure. Aortic valve regurgitation is  mild. Aortic valve sclerosis is present, with no evidence of aortic valve  stenosis.   7. The inferior vena cava is normal in size with greater than 50%  respiratory variability, suggesting right atrial pressure of 3 mmHg.   TELEMETRY reviewed by me 11/25/22: NSR LBBB rate 70s, occasional PVCs  EKG reviewed by me 11/25/22: NSR 1st degree AVB LBBB rate 74 bpm  DATA reviewed by me 11/25/22: last 24h vitals tele labs imaging I/O, hospitalist notes, cath report    ASSESSMENT AND PLAN: Nina Perez is a 82 y.o. female with a past medical history of PVD, hypertension, hyperlipidemia, LBBB, CKD stage III, OSA who presented to the ED on 11/21/2022 for chest tightness.  Troponin in the ED 13,770, no significant EKG abnormalities. Cardiology consulted for further assistance of NSTEMI.   # NSTEMI # s/p PCI and DES to prox RV marginal # Hypertension Patient presented to ED with CP x3 days. Initial troponin 13,770. Patient initially placed on heparin and nitroglycerin drips over weekend. Went for [facility] yesterday and underwent stent placement. -Continue aspirin 81 mg daily and plavix 75 mg daily. Plan for DAPT x12 months. -Will add amlodipine 5 mg daily for improved BP control. Continue losartan 100 mg daily and metoprolol succinate 100 mg daily.  -  Per patient preference she will establish care with Dr. Mariah Milling.  # Dyslipidemia Cholesterol well controlled on lipid panel.  -Continue atorvastatin 80 mg daily.    Principal Problem:   NSTEMI (non-ST elevated myocardial infarction) (HCC) Active Problems:   Gout   Type 2 diabetes mellitus without complications (HCC)   Dyslipidemia   Essential hypertension   Patient stable for discharge today from cardiac perspective with follow-up in 1 to 2 weeks with [facility] per her preference.  This patient's case was discussed and created with Dr. Juliann Pares and he is in agreement.  Burley Kopka Anita, PA-C 11/25/2022, 9:15 AM [facility] Cardiology

## 2022-11-25 NOTE — Progress Notes (Addendum)
Mobility Specialist - Progress Note   11/25/22 1447  Mobility  Activity Transferred to/from [facility]  Level of Assistance Dependent, patient does less than 25%  Assistive Device Front wheel walker  Distance Ambulated (ft) 2 ft  Activity Response Tolerated fair  $Mobility charge 1 Mobility     Pt sitting on BSC upon arrival, utilizing RA. Unable to stand with max +2 assist on 2 attempts; daughter at bedside was able to assist as a +3. Assist for peri-care d/t need for BUE support. Assist for hand placement onto RW. Pt follows commands well but some task are just physically difficult to complete. B knee flexion in standing with heavy anterior lean. VC for sequencing steps + manual facilitation of LLE to pivot. Pt fatigues very quickly, begins to return to seated position before safe to do so---assist to safely descend onto EOB. Pt returned supine with alarm set, needs in reach. Family at bedside.    Filiberto Pinks Mobility Specialist 11/25/22, 3:20 PM

## 2022-11-26 DIAGNOSIS — I214 Non-ST elevation (NSTEMI) myocardial infarction: Secondary | ICD-10-CM | POA: Diagnosis not present

## 2022-11-26 LAB — LIPOPROTEIN A (LPA): Lipoprotein (a): 160 nmol/L — ABNORMAL HIGH (ref <75.0)

## 2022-11-26 LAB — GLUCOSE, CAPILLARY
Glucose-Capillary: 96 mg/dL (ref 70–99)
Glucose-Capillary: 102 mg/dL — ABNORMAL HIGH (ref 70–99)
Glucose-Capillary: 105 mg/dL — ABNORMAL HIGH (ref 70–99)
Glucose-Capillary: 131 mg/dL — ABNORMAL HIGH (ref 70–99)

## 2022-11-26 MED ORDER — SPIRONOLACTONE 25 MG PO TABS
25.0000 mg | ORAL_TABLET | Freq: Every day | ORAL | Status: DC
  Administered 2022-11-26 – 2022-11-27 (×2): 25 mg via ORAL
  Filled 2022-11-26 (×2): qty 1

## 2022-11-26 MED ORDER — OXYCODONE HCL 5 MG PO TABS: 5.0000 mg | ORAL_TABLET | Freq: Four times a day (QID) | ORAL | Status: DC | PRN

## 2022-11-26 NOTE — Plan of Care (Signed)
  Problem: Education: Goal: Understanding of cardiac disease, CV risk reduction, and recovery process will improve Outcome: Progressing   Problem: Cardiac: Goal: Ability to achieve and maintain adequate cardiovascular perfusion will improve Outcome: Progressing   Problem: Coping: Goal: Ability to adjust to condition or change in health will improve Outcome: Progressing   Problem: Fluid Volume: Goal: Ability to maintain a balanced intake and output will improve Outcome: Progressing   Problem: Health Behavior/Discharge Planning: Goal: Ability to identify and utilize available resources and services will improve Outcome: Progressing Goal: Ability to manage health-related needs will improve Outcome: Progressing

## 2022-11-26 NOTE — Progress Notes (Signed)
Physical Therapy Treatment Patient Details Name: Nina Perez MRN: [id number] DOB: 04/16/1941 Today's Date: 11/26/2022   History of Present Illness Nina Perez is a 82 y.o. Caucasian female with medical history significant for stage III chronic kidney disease, GERD, with hiatal hernia, hypertension, dyslipidemia, ILD, left bundle branch block, PAD, RA, and type 2 diabetes mellitus, who presented to the emergency room with acute onset of left  chest tightness with associated palpitations and radiation to both arms.    PT Comments  Pt pleasant and motivated for therapy, lying in bed upon arrival. Pt required mod A for sup-sit but demonstrated good effort being able to initiate movement with VC's for sequencing. Pt able to perform multiple STS throughout session from elevated bed height and from [facility]; mod A + 2 throughout for elevation with RW. Pt failed initial attempt to stand from bed but with VC's for anterior weight shifting and hand placement she was able to successfully stand on subsequent attempts. Pt able to take 3-4 very small sidesteps to get to/from [facility]; mod A + 2 throughout for stability. Pt returned to supine position in bed with max A + 2 at end of session. Extra time this session for room/patient hygiene following an episode of incontinence with standing up, as well as for pt to have BM on BSC. Pt's vitals remained WNL throughout session, with HR staying low 70's to mid 80's, and O2 remaining low to mid 90's on room air. Pt will benefit from continued PT services upon discharge to safely address deficits listed in patient problem list for decreased caregiver assistance and eventual return to PLOF.     Assistance Recommended at Discharge Frequent or constant Supervision/Assistance  If plan is discharge home, recommend the following:  Can travel by private vehicle    Two people to help with walking and/or transfers;Two people to help with bathing/dressing/bathroom;Assistance with  cooking/housework;Assist for transportation;Help with stairs or ramp for entrance   No  Equipment Recommendations  Other (comment) (TBD)    Recommendations for Other Services       Precautions / Restrictions Precautions Precautions: Fall Restrictions Weight Bearing Restrictions: No     Mobility  Bed Mobility Overal bed mobility: Needs Assistance Bed Mobility: Supine to Sit, Sit to Supine     Supine to sit: Mod assist Sit to supine: Max assist, +2 for physical assistance   General bed mobility comments: Mod A for sup-sit for elevation and LE/trunk management, heavy VC's for sequencing and to use bed rails to assist. Max A +2 for sit-sup at end of session for trunk/LE management.    Transfers Overall transfer level: Needs assistance Equipment used: Rolling walker (2 wheels) Transfers: Sit to/from Stand Sit to Stand: Mod assist, +2 physical assistance, From elevated surface           General transfer comment: Pt able to perform multiple STS from elevated bed height and BSC with mod A for elevation, VC's for anterior weight shift and UE positioning.    Ambulation/Gait Ambulation/Gait assistance: Mod assist, +2 physical assistance Gait Distance (Feet): 2 x 2 Feet Assistive device: Rolling walker (2 wheels) Gait Pattern/deviations: Trunk flexed, Shuffle, Decreased step length - right, Decreased step length - left, Step-to pattern Gait velocity: decreased     General Gait Details: Pt able to perform 2 bouts of 3-4 very small, shuffling side-steps in order to get to/from [facility]. Mod A + 2 for stability throughout ambulation. VC's for weightshifting to allow feet to move to the side.  Stairs             Wheelchair Mobility     Tilt Bed    Modified Rankin (Stroke Patients Only)       Balance Overall balance assessment: Needs assistance Sitting-balance support: Feet supported, Single extremity supported Sitting balance-Leahy Scale: Fair Sitting balance -  Comments: Pt able to sit EOB with single UE support with no LOB   Standing balance support: Bilateral upper extremity supported, During functional activity, Reliant on assistive device for balance Standing balance-Leahy Scale: Poor Standing balance comment: Pt required constant mod A to maintain standing balance secondary to leg weakness                            Cognition Arousal/Alertness: Awake/alert Behavior During Therapy: WFL for tasks assessed/performed Overall Cognitive Status: Within Functional Limits for tasks assessed                                          Exercises Other Exercises Other Exercises: x3 STS from varying surfaces/heights    General Comments        Pertinent Vitals/Pain Pain Assessment Pain Assessment: No/denies pain    Home Living Family/patient expects to be discharged to:: Private residence Living Arrangements: Spouse/significant other Available Help at Discharge: Family Type of Home: House Home Access: Ramped entrance       Home Layout: Two level;Able to live on main level with bedroom/bathroom Home Equipment: Rolling Walker (2 wheels);Rollator (4 wheels);Shower seat      Prior Function            PT Goals (current goals can now be found in the care plan section) Progress towards PT goals: Progressing toward goals    Frequency    Min 1X/week      PT Plan Current plan remains appropriate    Co-evaluation              AM-PAC PT "6 Clicks" Mobility   Outcome Measure  Help needed turning from your back to your side while in a flat bed without using bedrails?: A Lot Help needed moving from lying on your back to sitting on the side of a flat bed without using bedrails?: A Lot Help needed moving to and from a bed to a chair (including a wheelchair)?: A Lot Help needed standing up from a chair using your arms (e.g., wheelchair or bedside chair)?: A Lot Help needed to walk in hospital room?: A  Lot Help needed climbing 3-5 steps with a railing? : Total 6 Click Score: 11    End of Session Equipment Utilized During Treatment: Gait belt Activity Tolerance: Patient tolerated treatment well Patient left: in bed;with call bell/phone within reach;with bed alarm set;with family/visitor present Nurse Communication: Mobility status PT Visit Diagnosis: Unsteadiness on feet (R26.81);Other abnormalities of gait and mobility (R26.89);Muscle weakness (generalized) (M62.81);Pain Pain - Right/Left: Left Pain - part of body: Knee     Time: 1400-1439 PT Time Calculation (min) (ACUTE ONLY): 39 min  Charges:                            Lavona Norsworthy, SPT 11/26/22, 5:20 PM

## 2022-11-26 NOTE — Evaluation (Signed)
Occupational Therapy Evaluation Patient Details Name: Nina Perez MRN: [id number] DOB: 1940/05/20 Today's Date: 11/26/2022   History of Present Illness Nina Perez is a 82 y.o. Caucasian female with medical history significant for stage III chronic kidney disease, GERD, with hiatal hernia, hypertension, dyslipidemia, ILD, left bundle branch block, PAD, RA, and type 2 diabetes mellitus, who presented to the emergency room with acute onset of left  chest tightness with associated palpitations and radiation to both arms.   Clinical Impression   Patient presenting with decreased Ind in self care,balance, functional mobility/transfers, endurance, and safety awareness. Patient reports living at home with husband and being Mod I with use of RW for all self care tasks. Husband assists with IADLs. Patient needing max A for bed mobility for B LEs and trunk support. Pt stands with mod A of 2 x 2 reps and comes to full upright position with cuing and facilitation. Mirror utilized for Financial trader. Pt taking side steps to the R with max multimodal cuing for technique and manual facilitation for weight shift to the L and R LE advancement. Pt remains seated EOB with daughter present in room. Patient will benefit from acute OT to increase overall independence in the areas of ADLs, functional mobility, and safety awareness in order to safely discharge.     Recommendations for follow up therapy are one component of a multi-disciplinary discharge planning process, led by the attending physician.  Recommendations may be updated based on patient status, additional functional criteria and insurance authorization.   Assistance Recommended at Discharge Intermittent Supervision/Assistance  Patient can return home with the following Two people to help with walking and/or transfers;Two people to help with bathing/dressing/bathroom;Assistance with cooking/housework;Assist for transportation;Help with stairs or ramp for  entrance    Functional Status Assessment  Patient has had a recent decline in their functional status and demonstrates the ability to make significant improvements in function in a reasonable and predictable amount of time.  Equipment Recommendations  Other (comment) (defer to next venue of care)    Recommendations for Other Services       Precautions / Restrictions Precautions Precautions: None      Mobility Bed Mobility Overal bed mobility: Needs Assistance Bed Mobility: Supine to Sit     Supine to sit: Max assist     General bed mobility comments: assistance for B LEs and trunk support to EOB    Transfers Overall transfer level: Needs assistance Equipment used: Rolling walker (2 wheels) Transfers: Sit to/from Stand Sit to Stand: Mod assist, +2 physical assistance                  Balance Overall balance assessment: Needs assistance Sitting-balance support: Bilateral upper extremity supported, Feet supported Sitting balance-Leahy Scale: Fair     Standing balance support: Bilateral upper extremity supported, During functional activity, Reliant on assistive device for balance Standing balance-Leahy Scale: Poor                             ADL either performed or assessed with clinical judgement   ADL Overall ADL's : Needs assistance/impaired                         Toilet Transfer: Rolling walker (2 wheels);+2 for physical assistance;Moderate assistance;Maximal assistance Toilet Transfer Details (indicate cue type and reason): simulated  Vision Patient Visual Report: No change from baseline              Pertinent Vitals/Pain Pain Assessment Pain Assessment: Faces Faces Pain Scale: Hurts even more Pain Location: L knee Pain Descriptors / Indicators: Aching, Discomfort Pain Intervention(s): Limited activity within patient's tolerance, Premedicated before session, Repositioned     Hand Dominance Right    Extremity/Trunk Assessment Upper Extremity Assessment Upper Extremity Assessment: Generalized weakness           Communication Communication Communication: No difficulties   Cognition Arousal/Alertness: Awake/alert Behavior During Therapy: WFL for tasks assessed/performed Overall Cognitive Status: Within Functional Limits for tasks assessed                                                  Home Living Family/patient expects to be discharged to:: Private residence Living Arrangements: Spouse/significant other Available Help at Discharge: Family Type of Home: House Home Access: Ramped entrance     Home Layout: Two level;Able to live on main level with bedroom/bathroom     Bathroom Shower/Tub: Producer, television/film/video: Standard     Home Equipment: Agricultural consultant (2 wheels);Rollator (4 wheels);Shower seat          Prior Functioning/Environment Prior Level of Function : Independent/Modified Independent               ADLs Comments: Pt reports being Mod I with self care and IADLs with RW.        OT Problem List: Decreased strength;Decreased activity tolerance;Decreased safety awareness;Impaired balance (sitting and/or standing);Decreased knowledge of use of DME or AE      OT Treatment/Interventions: Self-care/ADL training;Therapeutic exercise;Therapeutic activities;Energy conservation;DME and/or AE instruction;Patient/family education;Balance training    OT Goals(Current goals can be found in the care plan section) Acute Rehab OT Goals Patient Stated Goal: to go home OT Goal Formulation: With patient/family Time For Goal Achievement: 12/10/22 Potential to Achieve Goals: Fair ADL Goals Pt Will Perform Grooming: with min assist;standing Pt Will Perform Lower Body Dressing: sit to/from stand;with min assist Pt Will Transfer to Toilet: with min assist;ambulating Pt Will Perform Toileting - Clothing Manipulation and hygiene: with min  assist;sit to/from stand  OT Frequency: Min 1X/week       AM-PAC OT "6 Clicks" Daily Activity     Outcome Measure Help from another person eating meals?: None Help from another person taking care of personal grooming?: None Help from another person toileting, which includes using toliet, bedpan, or urinal?: Total Help from another person bathing (including washing, rinsing, drying)?: A Lot Help from another person to put on and taking off regular upper body clothing?: A Lot Help from another person to put on and taking off regular lower body clothing?: Total 6 Click Score: 14   End of Session Equipment Utilized During Treatment: Rolling walker (2 wheels) Nurse Communication: Mobility status  Activity Tolerance: Patient tolerated treatment well Patient left: in bed;with call bell/phone within reach;with bed alarm set;with family/visitor present  OT Visit Diagnosis: Unsteadiness on feet (R26.81);Muscle weakness (generalized) (M62.81)                Time: 7253-6644 OT Time Calculation (min): 23 min Charges:  OT General Charges $OT Visit: 1 Visit OT Evaluation $OT Eval Moderate Complexity: 1 Mod OT Treatments $Therapeutic Activity: 8-22 mins  Jackquline Denmark, MS, OTR/L , CBIS  ascom [phone]  11/26/22, 1:16 PM

## 2022-11-26 NOTE — Progress Notes (Signed)
[facility]  - [place] [facility]  PATIENT NAME: Nina Perez  MR#:  [id number] DATE OF BIRTH:  1940/05/27  Brief Hospital summary, SUBJECTIVE:  Nina Perez is a 82 y.o. Caucasian female with medical history significant for stage III chronic kidney disease, GERD, with hiatal hernia, hypertension, dyslipidemia, ILD, left bundle branch block, PAD, RA, and type 2 diabetes mellitus, who presented to the emergency room with acute onset of left parasternal chest tightness graded 5/10 in severity with associated palpitations and radiation to both arms.  S/p cath with PCI stent M2 branch of RCA Noted to have significant hypertension on presentation.   7/17: patient is chest free and doing well. BP has been improving. Changing her amlodipine to spironolactone today. Working with PT/OT and will be discharging to SNF.   OBJECTIVE:  Blood pressure (!) 166/76, pulse 73, temperature 98 F (36.7 C), resp. rate (!) 25, height 5\' 6"  (1.676 m), weight 108.6 kg, last menstrual period 05/25/1988, SpO2 93%.  GENERAL: no acute distress.  LUNGS: Normal breath sounds bilaterally, no wheezing CARDIOVASCULAR: S1, S2 normal. No murmur   ABDOMEN: Soft, nontender, nondistended. Bowel sounds present.  EXTREMITIES: No  edema b/l.    NEUROLOGIC: nonfocal  patient is alert and awake SKIN: No obvious rash, lesion, or ulcer.   LABORATORY PANEL:  CBC Recent Labs  Lab 11/25/22 0611  WBC 4.9  HGB 11.5*  HCT 36.9  PLT 129*    Chemistries  Recent Labs  Lab 11/25/22 0611  NA 134*  K 4.0  CL 104  CO2 23  GLUCOSE 89  BUN 15  CREATININE 0.85  CALCIUM 9.3   Cardiac Enzymes No results for input(s): "TROPONINI" in the last 168 hours. RADIOLOGY:  CARDIAC CATHETERIZATION  Result Date: 11/24/2022   RV Branch lesion is 90% stenosed.   Prox RCA lesion is 40% stenosed.   1st Mrg lesion is 55% stenosed.   1st Diag lesion is 60% stenosed.   The left ventricular systolic function is normal.   LV end  diastolic pressure is mildly elevated.   The left ventricular ejection fraction is 50-55% by visual estimate.   There is trivial (1+) mitral regurgitation.   Recommend dual antiplatelet therapy with Aspirin 81mg  daily and Clopidogrel 75mg  daily. Patient had non-STEMI with troponin as high as 13,000.  Culprit lesion probably is marginal branch of right coronary artery as there is a high-grade lesion about 90%.  Advise PCI and stenting of the marginal branch of right coronary artery.  LVEF was 53% on echocardiogram with wall motion abnormality in the septum as well as anterior wall most likely due to left bundle branch block.   CARDIAC CATHETERIZATION  Result Date: 11/24/2022   1st Mrg lesion is 70% stenosed.   2nd Diag lesion is 80% stenosed.   RV Branch lesion is 90% stenosed.   A drug-eluting stent was successfully placed using a STENT ONYX FRONTIER 2.75X22.   Post intervention, there is a 0% residual stenosis. Successful angioplasty and drug-eluting stent placement to proximal RV marginal of the right coronary artery which is large in size and supplies the right PDA distribution. Recommendations: Dual antiplatelet therapy for 12 months. Aggressive treatment of risk factors. The second diagonal is diseased but relatively small in size.  No PCI is recommended. I increased Toprol for better blood pressure control.    Assessment and Plan Nina Perez is a 82 y.o. Caucasian female with medical history significant for stage III chronic kidney disease, GERD, with  hiatal hernia, hypertension, dyslipidemia, ILD, left bundle branch block, PAD, RA, and type 2 diabetes mellitus, who presented to the emergency room with acute onset of left parasternal chest tightness graded 5/10 in severity with associated palpitations and radiation to both arms.    High sensitive troponin I was 13,770 and later on 13,227   EKG showed normal sinus rhythm with a rate of 79 with left bundle branch block.   Acute NSTEMI  HFpEF- s/p  LHC with PCI and DES 7/15, echo 7/14: EV 50-55%, grade III DD.  - cardiology following, appreciate your care  -Continue aspirin 81 mg daily and plavix 75 mg daily. Plan for DAPT x12 months.  - continue statin therapy  HTN- improved but still poorly controlled - continue metoprolol and losartan - changed antihypertensive to spironolactone.    Type 2 diabetes mellitus without complications (HCC) - on supplemental coverage with NovoLog.   Gout - continue allopurinol.   Generalized weakness/debility  osteoarthritis bilateral knees -- PT/OT- SNF at dc  DVT prophylaxis: lovenox Advanced Care Planning:  Code Status: full code. Family communication: daughter at bedside Level of care: Progressive Status is: Inpatient Remains inpatient appropriate because: non-STEMI. Normalized weakness. Await PT eval. TOTAL TIME TAKING CARE OF THIS PATIENT: 35 minutes.  >50% time spent on counselling and coordination of care   Leeroy Bock M.D    [facility]   CC: Primary care physician; Dale Wedgewood, MD

## 2022-11-26 NOTE — TOC Progression Note (Signed)
Transition of Care [facility]) - Progression Note    Patient Details  Name: Nina Perez MRN: [id number] Date of Birth: 1940-12-22  Transition of Care [facility]) CM/SW Contact  Darolyn Rua, [place] Phone Number: 11/26/2022, 10:43 AM  Clinical Narrative:     CSW spoke with patient's daughter Sue Lush, provided bed offers. She reports she will review and let CSW know what they choose. Pending facility choice at this time.        Expected Discharge Plan and Services         Expected Discharge Date: 11/25/22                                     Social Determinants of Health (SDOH) Interventions SDOH Screenings   Food Insecurity: No Food Insecurity (11/22/2022)  Housing: Low Risk  (11/22/2022)  Transportation Needs: No Transportation Needs (11/22/2022)  Utilities: Not At Risk (11/22/2022)  Depression (PHQ2-9): Low Risk  (07/14/2022)  Financial Resource Strain: Low Risk  (11/20/2021)  Physical Activity: Sufficiently Active (11/20/2021)  Social Connections: Socially Integrated (11/20/2021)  Stress: No Stress Concern Present (11/20/2021)  Tobacco Use: Medium Risk (11/22/2022)    Readmission Risk Interventions     No data to display

## 2022-11-27 DIAGNOSIS — I214 Non-ST elevation (NSTEMI) myocardial infarction: Secondary | ICD-10-CM | POA: Diagnosis not present

## 2022-11-27 LAB — BASIC METABOLIC PANEL
Anion gap: 9 (ref 5–15)
BUN: 20 mg/dL (ref 8–23)
CO2: 23 mmol/L (ref 22–32)
Calcium: 9.3 mg/dL (ref 8.9–10.3)
Chloride: 104 mmol/L (ref 98–111)
Creatinine, Ser: 0.94 mg/dL (ref 0.44–1.00)
GFR, Estimated: >60 mL/min (ref >60)
Glucose, Bld: 91 mg/dL (ref 70–99)
Potassium: 3.6 mmol/L (ref 3.5–5.1)
Sodium: 136 mmol/L (ref 135–145)

## 2022-11-27 LAB — GLUCOSE, CAPILLARY
Glucose-Capillary: 90 mg/dL (ref 70–99)
Glucose-Capillary: 105 mg/dL — ABNORMAL HIGH (ref 70–99)

## 2022-11-27 MED ORDER — SPIRONOLACTONE 25 MG PO TABS: 25.0000 mg | ORAL_TABLET | Freq: Every day | ORAL | Status: DC

## 2022-11-27 MED ORDER — ATORVASTATIN CALCIUM 80 MG PO TABS: 80.0000 mg | ORAL_TABLET | Freq: Every day | ORAL | Status: DC

## 2022-11-27 NOTE — Plan of Care (Signed)
  Problem: Education: Goal: Understanding of cardiac disease, CV risk reduction, and recovery process will improve Outcome: Progressing Goal: Individualized Educational Video(s) Outcome: Progressing   Problem: Education: Goal: Individualized Educational Video(s) Outcome: Progressing   Problem: Activity: Goal: Ability to tolerate increased activity will improve Outcome: Progressing   Problem: Cardiac: Goal: Ability to achieve and maintain adequate cardiovascular perfusion will improve Outcome: Progressing   Problem: Health Behavior/Discharge Planning: Goal: Ability to safely manage health-related needs after discharge will improve Outcome: Progressing   Problem: Education: Goal: Ability to describe self-care measures that may prevent or decrease complications (Diabetes Survival Skills Education) will improve Outcome: Progressing

## 2022-11-27 NOTE — TOC Transition Note (Signed)
Transition of Care [facility]) - CM/SW Discharge Note   Patient Details  Name: Nina Perez MRN: [id number] Date of Birth: 1940/12/22  Transition of Care [facility]) CM/SW Contact:  Darolyn Rua, LCSW Phone Number: 11/27/2022, 10:40 AM   Clinical Narrative:     Patient will DC to: [facility] Anticipated DC date: 11/27/22 Family notified: daughter Lacie Draft byWendie Simmer  Per MD patient ready for DC to [facility]  . RN, patient, patient's family, and facility notified of DC. Discharge Summary sent to facility. RN given number for report [phone] Room E-12. DC packet on chart. Ambulance transport requested for patient.  CSW signing off.  Angeline Slim, LCSW   Final next level of care: Skilled Nursing Facility Barriers to Discharge: No Barriers Identified   Patient Goals and CMS Choice CMS Medicare.gov Compare Post Acute Care list provided to:: Patient Represenative (must comment) (family)    Discharge Placement                  Patient to be transferred to facility by: [facility] Name of family member notified: daughter Patient and family notified of of transfer: 11/27/22  Discharge Plan and Services Additional resources added to the After Visit Summary for                                       Social Determinants of Health (SDOH) Interventions SDOH Screenings   Food Insecurity: No Food Insecurity (11/22/2022)  Housing: Low Risk  (11/22/2022)  Transportation Needs: No Transportation Needs (11/22/2022)  Utilities: Not At Risk (11/22/2022)  Depression (PHQ2-9): Low Risk  (07/14/2022)  Financial Resource Strain: Low Risk  (11/20/2021)  Physical Activity: Sufficiently Active (11/20/2021)  Social Connections: Socially Integrated (11/20/2021)  Stress: No Stress Concern Present (11/20/2021)  Tobacco Use: Medium Risk (11/22/2022)     Readmission Risk Interventions     No data to display

## 2022-11-27 NOTE — Discharge Summary (Signed)
Physician Discharge Summary  Patient: Nina Perez QMV:[id number] DOB: 12/19/1940   Code Status: Full Code Admit date: 11/21/2022 Discharge date: 11/27/2022 Disposition: Skilled nursing facility, PT, OT, nurse aid, and RN PCP: Dale [place], MD  Recommendations for Outpatient Follow-up:  Follow up with PCP within 1-2 weeks Regarding general hospital follow up and preventative care Follow up with cardiology  Discharge Diagnoses:  Principal Problem:   NSTEMI (non-ST elevated myocardial infarction) [facility]) Active Problems:   Gout   Type 2 diabetes mellitus without complications (HCC)   Dyslipidemia   Essential hypertension  Brief Hospital Course Summary: Nina Perez is a 82 y.o. female with medical history significant for stageIII CKD, GERD with hiatal hernia, hypertension, dyslipidemia, ILD, left bundle branch block, PAD, RA, and type 2 diabetes mellitus. They presented to the emergency room with acute onset of left parasternal chest tightness graded 5/10 in severity with associated palpitations and radiation to both arms.  Found to be having NSTEMI with peak troponin >13,000. Started on nitro gtt and heparin gtt.  Noted to have significant hypertension on presentation.  Cardiology was consulted and she underwent urgent intervention when she continued to have chest discomfort.  S/p cath with PCI stent M2 branch of RCA 7/15.   Post-op, patient is chest pain free and doing well overall. After prolonged course of being mostly bed-bound in combination with chronic knee pain, she had significant decrease in her mobility and strength. PT/OT evaluated and recommended SNF at dc.  She is stable and ready for dc today. Will need cardiology follow up as well as ortho follow up for her chronic knee arthritis.   She was discharged with her home medications with the exception of increased dose of metoprolol and atorvastatin as well as the addition of spironolactone for better blood pressure  control. She was at goal of 130-140 systolics on this regimen.   Discharge Condition: Good, improved Recommended discharge diet: Regular healthy diet  Consultations: Cardiology   Procedures/Studies: LHC with PCI  Discharge Instructions     AMB Referral to Cardiac Rehabilitation - Phase II   Complete by: As directed    Diagnosis:  NSTEMI Coronary Stents     After initial evaluation and assessments completed: Virtual Based Care may be provided alone or in conjunction with Phase 2 Cardiac Rehab based on patient barriers.: Yes   Intensive Cardiac Rehabilitation (ICR) [facility] location only OR Traditional Cardiac Rehabilitation (TCR) *If criteria for ICR are not met will enroll in TCR [facility] only): Yes   Diet - low sodium heart healthy   Complete by: As directed    Increase activity slowly   Complete by: As directed       Allergies as of 11/27/2022       Reactions   Maxzide [triamterene-hctz] Other (See Comments)   Weakness/fatigue   Tape Other (See Comments)   Ob band aid adhesive  Pull skin off / Rash   Cephalexin Rash   Blotchiness to B lower legs after several days of cephalexin for UTI   Hydrochlorothiazide W-triamterene    Other reaction(s): Other (See Comments) Extreme fatigue/weakness        Medication List     STOP taking these medications    aspirin 81 MG chewable tablet Replaced by: aspirin EC 81 MG tablet   celecoxib 200 MG capsule Commonly known as: CELEBREX   magnesium oxide 400 (240 Mg) MG tablet Commonly known as: MAG-OX   mupirocin ointment 2 % Commonly known as: [employer]  traMADol 50 MG tablet Commonly known as: ULTRAM   triamcinolone cream 0.1 % Commonly known as: KENALOG   trospium 20 MG tablet Commonly known as: SANCTURA       TAKE these medications    acetaminophen 650 MG CR tablet Commonly known as: TYLENOL Take 650-1,300 mg by mouth every 8 (eight) hours as needed for pain.   allopurinol 100 MG tablet Commonly known as:  ZYLOPRIM Take 1 tablet (100 mg total) by mouth daily.   amLODipine 5 MG tablet Commonly known as: NORVASC Take 1 tablet (5 mg total) by mouth daily.   aspirin EC 81 MG tablet Take 1 tablet (81 mg total) by mouth daily. Swallow whole. Replaces: aspirin 81 MG chewable tablet   atorvastatin 80 MG tablet Commonly known as: LIPITOR Take 1 tablet (80 mg total) by mouth daily. What changed:  medication strength how much to take   clopidogrel 75 MG tablet Commonly known as: PLAVIX Take 1 tablet (75 mg total) by mouth daily with breakfast.   fexofenadine 180 MG tablet Commonly known as: ALLEGRA Take 1 tablet (180 mg total) by mouth daily. What changed:  when to take this reasons to take this   folic acid 1 MG tablet Commonly known as: FOLVITE Take 1 mg by mouth every evening.   glucose blood test strip Commonly known as: IT consultant USE  STRIP TO CHECK GLUCOSE TWICE DAILY. Dx: E11.9   ipratropium 0.06 % nasal spray Commonly known as: ATROVENT Place 2 sprays into both nostrils 4 (four) times daily. What changed:  when to take this reasons to take this   losartan 100 MG tablet Commonly known as: COZAAR Take 1 tablet (100 mg total) by mouth daily.   metFORMIN 500 MG tablet Commonly known as: GLUCOPHAGE Take 1 tablet by mouth once daily   methotrexate 2.5 MG tablet Commonly known as: RHEUMATREX Take 10 mg by mouth every Wednesday.   metoprolol succinate 100 MG 24 hr tablet Commonly known as: TOPROL-XL Take 1 tablet (100 mg total) by mouth daily. Take with or immediately following a meal. What changed:  medication strength how much to take additional instructions   Microlet Lancets Misc Check sugar once daily, [employer] Microlet Lancets. Dx E11.9   nitroGLYCERIN 0.4 MG SL tablet Commonly known as: NITROSTAT Place 1 tablet (0.4 mg total) under the tongue every 5 (five) minutes x 3 doses as needed for chest pain.   pantoprazole 40 MG tablet Commonly known  as: PROTONIX Take 1 tablet by mouth once daily   spironolactone 25 MG tablet Commonly known as: ALDACTONE Take 1 tablet (25 mg total) by mouth daily. Start taking on: November 28, 2022   torsemide 20 MG tablet Commonly known as: DEMADEX Take 20 mg by mouth as needed. Taking 1/2 tablet just as needed        Follow-up Information     Gollan, Tollie Pizza, MD. Go to.   Specialty: Cardiology Why: Appointment on Friday, 12/19/2022 at 8:25am. You have been added to a cancellation list for an earlier appointment. Please be on the lookout for a phone call from the office. Contact information: [address] [place] [address] [phone]                Subjective   Pt reports feeling well today. She is wanting to get out of bed more on her own but currently not feeling that she is able to safely stand independently. Denies CP, SOB. No knee pain  at rest and it is well controlled with tylenol. She is ready for rehab.   All questions and concerns were addressed at time of discharge.  Objective  Blood pressure (!) 141/68, pulse 70, temperature 98.6 F (37 C), temperature source Oral, resp. rate 16, height 5\' 6"  (1.676 m), weight 108.6 kg, last menstrual period 05/25/1988, SpO2 93%.   General: Pt is alert, awake, not in acute distress Cardiovascular: RRR, S1/S2 +, no rubs, no gallops Respiratory: CTA bilaterally, no wheezing, no rhonchi Abdominal: Soft, NT, ND, bowel sounds + Extremities: no edema, no cyanosis  The results of significant diagnostics from this hospitalization (including imaging, microbiology, ancillary and laboratory) are listed below for reference.   Imaging studies: CARDIAC CATHETERIZATION  Result Date: 11/24/2022   RV Branch lesion is 90% stenosed.   Prox RCA lesion is 40% stenosed.   1st Mrg lesion is 55% stenosed.   1st Diag lesion is 60% stenosed.   The left ventricular systolic function is normal.   LV end diastolic pressure is mildly elevated.    The left ventricular ejection fraction is 50-55% by visual estimate.   There is trivial (1+) mitral regurgitation.   Recommend dual antiplatelet therapy with Aspirin 81mg  daily and Clopidogrel 75mg  daily. Patient had non-STEMI with troponin as high as 13,000.  Culprit lesion probably is marginal branch of right coronary artery as there is a high-grade lesion about 90%.  Advise PCI and stenting of the marginal branch of right coronary artery.  LVEF was 53% on echocardiogram with wall motion abnormality in the septum as well as anterior wall most likely due to left bundle branch block.   CARDIAC CATHETERIZATION  Result Date: 11/24/2022   1st Mrg lesion is 70% stenosed.   2nd Diag lesion is 80% stenosed.   RV Branch lesion is 90% stenosed.   A drug-eluting stent was successfully placed using a STENT ONYX FRONTIER 2.75X22.   Post intervention, there is a 0% residual stenosis. Successful angioplasty and drug-eluting stent placement to proximal RV marginal of the right coronary artery which is large in size and supplies the right PDA distribution. Recommendations: Dual antiplatelet therapy for 12 months. Aggressive treatment of risk factors. The second diagonal is diseased but relatively small in size.  No PCI is recommended. I increased Toprol for better blood pressure control.   ECHOCARDIOGRAM COMPLETE  Result Date: 11/23/2022    ECHOCARDIOGRAM REPORT   Patient Name:   Nina Perez Date of Exam: 11/23/2022 Medical Rec #:  [id number]    Height:       66.0 in Accession #:    [id number]   Weight:       210.1 lb Date of Birth:  [birth year]    BSA:          2.042 m Patient Age:    82 years     BP:           168/68 mmHg Patient Gender: F            HR:           77 bpm. Exam Location:  [facility] Procedure: 2D Echo and Intracardiac Opacification Agent Indications:     NSTEMI I21.4  History:         Patient has no prior history of Echocardiogram examinations.  Sonographer:     Overton Mam RDCS, FASE Referring Phys:   [id number] [name] Diagnosing Phys: [name]  Sonographer Comments: Technically difficult study due to poor echo windows and no subcostal  window. Image acquisition challenging due to patient body habitus. IMPRESSIONS  1. Left ventricular ejection fraction, by estimation, is 50 to 55%. The left ventricle has low normal function. The left ventricle demonstrates regional wall motion abnormalities (see scoring diagram/findings for description). Left ventricular diastolic  parameters are consistent with Grade III diastolic dysfunction (restrictive).  2. Right ventricular systolic function is normal. The right ventricular size is mildly enlarged.  3. Left atrial size was moderately dilated.  4. Right atrial size was moderately dilated.  5. The mitral valve is normal in structure. Mild mitral valve regurgitation. No evidence of mitral stenosis.  6. The aortic valve is normal in structure. Aortic valve regurgitation is mild. Aortic valve sclerosis is present, with no evidence of aortic valve stenosis.  7. The inferior vena cava is normal in size with greater than 50% respiratory variability, suggesting right atrial pressure of 3 mmHg. FINDINGS  Left Ventricle: Left ventricular ejection fraction, by estimation, is 50 to 55%. The left ventricle has low normal function. The left ventricle demonstrates regional wall motion abnormalities. Definity contrast agent was given IV to delineate the left ventricular endocardial borders. The left ventricular internal cavity size was normal in size. There is no left ventricular hypertrophy. Left ventricular diastolic parameters are consistent with Grade III diastolic dysfunction (restrictive).  LV Wall Scoring: The inferior septum is akinetic. The anterior septum is hypokinetic. Right Ventricle: The right ventricular size is mildly enlarged. No increase in right ventricular wall thickness. Right ventricular systolic function is normal. Left Atrium: Left atrial size was moderately  dilated. Right Atrium: Right atrial size was moderately dilated. Pericardium: There is no evidence of pericardial effusion. Mitral Valve: The mitral valve is normal in structure. Mild mitral valve regurgitation. No evidence of mitral valve stenosis. Tricuspid Valve: The tricuspid valve is normal in structure. Tricuspid valve regurgitation is mild . No evidence of tricuspid stenosis. Aortic Valve: The aortic valve is normal in structure. Aortic valve regurgitation is mild. Aortic valve sclerosis is present, with no evidence of aortic valve stenosis. Aortic valve mean gradient measures 11.5 mmHg. Aortic valve peak gradient measures 19.3 mmHg. Aortic valve area, by VTI measures 1.21 cm. Pulmonic Valve: The pulmonic valve was normal in structure. Pulmonic valve regurgitation is not visualized. No evidence of pulmonic stenosis. Aorta: The aortic root is normal in size and structure. Venous: The inferior vena cava is normal in size with greater than 50% respiratory variability, suggesting right atrial pressure of 3 mmHg. IAS/Shunts: No atrial level shunt detected by color flow Doppler.  LEFT VENTRICLE PLAX 2D LVIDd:         5.30 cm      Diastology LVIDs:         3.90 cm      LV e' medial:    5.44 cm/s LV PW:         1.45 cm      LV E/e' medial:  17.8 LV IVS:        1.15 cm      LV e' lateral:   5.00 cm/s LVOT diam:     1.80 cm      LV E/e' lateral: 19.4 LV SV:         55 LV SV Index:   27 LVOT Area:     2.54 cm  LV Volumes (MOD) LV vol d, MOD A2C: 158.0 ml LV vol d, MOD A4C: 115.8 ml LV vol s, MOD A2C: 62.0 ml LV vol s, MOD A4C: 55.5 ml LV SV  MOD A2C:     96.0 ml LV SV MOD A4C:     115.8 ml LV SV MOD BP:      75.9 ml RIGHT VENTRICLE RV Basal diam:  3.10 cm RV S prime:     17.00 cm/s TAPSE (M-mode): 2.9 cm LEFT ATRIUM             Index        RIGHT ATRIUM           Index LA diam:        5.00 cm 2.45 cm/m   RA Area:     10.30 cm LA Vol (A2C):   44.7 ml 21.89 ml/m  RA Volume:   21.30 ml  10.43 ml/m LA Vol (A4C):   74.6  ml 36.53 ml/m LA Biplane Vol: 60.8 ml 29.77 ml/m  AORTIC VALVE                     PULMONIC VALVE AV Area (Vmax):    1.34 cm      PV Vmax:       1.25 m/s AV Area (Vmean):   1.31 cm      PV Peak grad:  6.2 mmHg AV Area (VTI):     1.21 cm AV Vmax:           219.67 cm/s AV Vmean:          157.500 cm/s AV VTI:            0.459 m AV Peak Grad:      19.3 mmHg AV Mean Grad:      11.5 mmHg LVOT Vmax:         116.00 cm/s LVOT Vmean:        80.800 cm/s LVOT VTI:          0.218 m LVOT/AV VTI ratio: 0.47  AORTA Ao Root diam: 3.40 cm Ao Asc diam:  2.90 cm MITRAL VALVE MV Area (PHT): 5.79 cm     SHUNTS MV Decel Time: 131 msec     Systemic VTI:  0.22 m MV E velocity: 96.90 cm/s   Systemic Diam: 1.80 cm MV A velocity: 149.00 cm/s MV E/A ratio:  0.65 Shaukat Khan Electronically signed by [name] Signature Date/Time: 11/23/2022/1:02:50 PM    Final    DG Chest 2 View  Result Date: 11/21/2022 CLINICAL DATA:  Chest tightness EXAM: CHEST - 2 VIEW COMPARISON:  08/30/2020, 06/17/2018 FINDINGS: Prominent right hilar vascular shadow. Cardiomegaly. No acute airspace disease, pleural effusion or pneumothorax IMPRESSION: Cardiomegaly. Electronically Signed   By: Jasmine Pang M.D.   On: 11/21/2022 18:13    Labs: Basic Metabolic Panel: Recent Labs  Lab 11/21/22 1741 11/22/22 0454 11/24/22 0433 11/25/22 0611 11/27/22 0618  NA 135 137  --  134* 136  K 4.2 3.8  --  4.0 3.6  CL 104 107  --  104 104  CO2 22 24  --  23 23  GLUCOSE 149* 103*  --  89 91  BUN 21 20  --  15 20  CREATININE 0.97 0.81 0.76 0.85 0.94  CALCIUM 9.7 9.3  --  9.3 9.3   CBC: Recent Labs  Lab 11/21/22 1741 11/22/22 0454 11/23/22 0442 11/24/22 0434 11/25/22 0611  WBC 5.3 4.5 4.2 3.8* 4.9  HGB 12.0 10.4* 10.2* 11.1* 11.5*  HCT 39.1 33.5* 32.8* 35.7* 36.9  MCV 97.8 96.3 97.3 96.5 97.6  PLT 111* 113* 120* 135* 129*   Microbiology: Results for  orders placed or performed during the hospital encounter of 11/21/22  SARS Coronavirus 2 by RT  PCR (hospital order, performed in [facility] hospital lab) *cepheid single result test* Anterior Nasal Swab     Status: None   Collection Time: 11/21/22  8:05 PM   Specimen: Anterior Nasal Swab  Result Value Ref Range Status   SARS Coronavirus 2 by RT PCR NEGATIVE NEGATIVE Final    Comment: (NOTE) SARS-CoV-2 target nucleic acids are NOT DETECTED.  The SARS-CoV-2 RNA is generally detectable in upper and lower respiratory specimens during the acute phase of infection. The lowest concentration of SARS-CoV-2 viral copies this assay can detect is 250 copies / mL. A negative result does not preclude SARS-CoV-2 infection and should not be used as the sole basis for treatment or other patient management decisions.  A negative result may occur with improper specimen collection / handling, submission of specimen other than nasopharyngeal swab, presence of viral mutation(s) within the areas targeted by this assay, and inadequate number of viral copies (<250 copies / mL). A negative result must be combined with clinical observations, patient history, and epidemiological information.  Fact Sheet for Patients:   RoadLapTop.co.za  Fact Sheet for Healthcare Providers: http://kim-miller.com/  This test is not yet approved or  cleared by the [place] FDA and has been authorized for detection and/or diagnosis of SARS-CoV-2 by FDA under an Emergency Use Authorization (EUA).  This EUA will remain in effect (meaning this test can be used) for the duration of the COVID-19 declaration under Section 564(b)(1) of the Act, 21 U.S.C. section 360bbb-3(b)(1), unless the authorization is terminated or revoked sooner.  Performed at [facility], [address], [place], [place] [address]    Time coordinating discharge: Over 30 minutes  Leeroy Bock, MD  [facility] 11/27/2022, 10:40 AM

## 2022-11-27 NOTE — TOC Progression Note (Signed)
Transition of Care [facility]) - Progression Note    Patient Details  Name: Nina Perez MRN: [id number] Date of Birth: 1941/01/19  Transition of Care [facility]) CM/SW Contact  Darolyn Rua, [place] Phone Number: 11/27/2022, 9:19 AM  Clinical Narrative:     Patient has bed at [facility] pending dc summary for discharge today if medically cleared.        Expected Discharge Plan and Services         Expected Discharge Date: 11/25/22                                     Social Determinants of Health (SDOH) Interventions SDOH Screenings   Food Insecurity: No Food Insecurity (11/22/2022)  Housing: Low Risk  (11/22/2022)  Transportation Needs: No Transportation Needs (11/22/2022)  Utilities: Not At Risk (11/22/2022)  Depression (PHQ2-9): Low Risk  (07/14/2022)  Financial Resource Strain: Low Risk  (11/20/2021)  Physical Activity: Sufficiently Active (11/20/2021)  Social Connections: Socially Integrated (11/20/2021)  Stress: No Stress Concern Present (11/20/2021)  Tobacco Use: Medium Risk (11/22/2022)    Readmission Risk Interventions     No data to display

## 2022-11-27 NOTE — Progress Notes (Incomplete)
[facility]  - [place] [facility]  PATIENT NAME: Nina Perez  MR#:  [id number] DATE OF BIRTH:  Sep 27, 1940  Brief Hospital summary, SUBJECTIVE:  Nina Perez is a 82 y.o. Caucasian female with medical history significant for stage III chronic kidney disease, GERD, with hiatal hernia, hypertension, dyslipidemia, ILD, left bundle branch block, PAD, RA, and type 2 diabetes mellitus, who presented to the emergency room with acute onset of left parasternal chest tightness graded 5/10 in severity with associated palpitations and radiation to both arms.  S/p cath with PCI stent M2 branch of RCA Noted to have significant hypertension on presentation.   7/17: patient is chest free and doing well. BP has been improving. Changing her amlodipine to spironolactone today. Working with PT/OT and will be discharging to SNF.   OBJECTIVE:  Blood pressure (!) 141/68, pulse 70, temperature 98.6 F (37 C), temperature source Oral, resp. rate 16, height 5\' 6"  (1.676 m), weight 108.6 kg, last menstrual period 05/25/1988, SpO2 93%.  GENERAL: no acute distress.  LUNGS: Normal breath sounds bilaterally, no wheezing CARDIOVASCULAR: S1, S2 normal. No murmur   ABDOMEN: Soft, nontender, nondistended. Bowel sounds present.  EXTREMITIES: No  edema b/l.    NEUROLOGIC: nonfocal  patient is alert and awake SKIN: No obvious rash, lesion, or ulcer.   LABORATORY PANEL:  CBC Recent Labs  Lab 11/25/22 0611  WBC 4.9  HGB 11.5*  HCT 36.9  PLT 129*    Chemistries  Recent Labs  Lab 11/27/22 0618  NA 136  K 3.6  CL 104  CO2 23  GLUCOSE 91  BUN 20  CREATININE 0.94  CALCIUM 9.3   Cardiac Enzymes No results for input(s): "TROPONINI" in the last 168 hours. RADIOLOGY:  No results found.  Assessment and Plan Nina Perez is a 82 y.o. Caucasian female with medical history significant for stage III chronic kidney disease, GERD, with hiatal hernia, hypertension, dyslipidemia, ILD, left bundle branch  block, PAD, RA, and type 2 diabetes mellitus, who presented to the emergency room with acute onset of left parasternal chest tightness graded 5/10 in severity with associated palpitations and radiation to both arms.    High sensitive troponin I was 13,770 and later on 13,227   EKG showed normal sinus rhythm with a rate of 79 with left bundle branch block.   Acute NSTEMI  HFpEF- s/p LHC with PCI and DES 7/15, echo 7/14: EV 50-55%, grade III DD.  - cardiology following, appreciate your care  -Continue aspirin 81 mg daily and plavix 75 mg daily. Plan for DAPT x12 months.  - continue statin therapy  HTN- improved but still poorly controlled - continue metoprolol and losartan - changed antihypertensive to spironolactone.    Type 2 diabetes mellitus without complications (HCC) - on supplemental coverage with NovoLog.   Gout - continue allopurinol.   Generalized weakness/debility  osteoarthritis bilateral knees -- PT/OT- SNF at dc  DVT prophylaxis: lovenox Advanced Care Planning:  Code Status: full code. Family communication: daughter at bedside Level of care: Med-Surg Status is: Inpatient Remains inpatient appropriate because: non-STEMI. Normalized weakness. Await PT eval. TOTAL TIME TAKING CARE OF THIS PATIENT: 35 minutes.  >50% time spent on counselling and coordination of care   Leeroy Bock M.D    [facility]   CC: Primary care physician; Dale [place], MD

## 2022-12-02 ENCOUNTER — Other Ambulatory Visit: Payer: Self-pay

## 2022-12-12 ENCOUNTER — Ambulatory Visit: Payer: Medicare Other | Admitting: Cardiology

## 2022-12-12 ENCOUNTER — Encounter: Payer: Self-pay | Admitting: Cardiology

## 2022-12-12 VITALS — BP 131/77 | HR 57 | Ht 66.0 in | Wt 197.5 lb

## 2022-12-12 DIAGNOSIS — I10 Essential (primary) hypertension: Secondary | ICD-10-CM | POA: Diagnosis not present

## 2022-12-12 DIAGNOSIS — I2510 Atherosclerotic heart disease of native coronary artery without angina pectoris: Secondary | ICD-10-CM

## 2022-12-12 DIAGNOSIS — Z01810 Encounter for preprocedural cardiovascular examination: Secondary | ICD-10-CM | POA: Diagnosis not present

## 2022-12-12 DIAGNOSIS — E7800 Pure hypercholesterolemia, unspecified: Secondary | ICD-10-CM

## 2022-12-12 DIAGNOSIS — R609 Edema, unspecified: Secondary | ICD-10-CM | POA: Diagnosis present

## 2022-12-12 NOTE — Patient Instructions (Signed)
Medication Instructions:   Your physician recommends that you continue on your current medications as directed. Please refer to the Current Medication list given to you today.  *If you need a refill on your cardiac medications before your next appointment, please call your pharmacy*   Lab Work:  None Ordered  If you have labs (blood work) drawn today and your tests are completely normal, you will receive your results only by: MyChart Message (if you have MyChart) OR A paper copy in the mail If you have any lab test that is abnormal or we need to change your treatment, we will call you to review the results.   Testing/Procedures:  None Ordered    Follow-Up: At [facility], you and your health needs are our priority.  As part of our continuing mission to provide you with exceptional heart care, we have created designated Provider Care Teams.  These Care Teams include your primary Cardiologist (physician) and Advanced Practice Providers (APPs -  Physician Assistants and Nurse Practitioners) who all work together to provide you with the care you need, when you need it.  We recommend signing up for the patient portal called "MyChart".  Sign up information is provided on this After Visit Summary.  MyChart is used to connect with patients for Virtual Visits (Telemedicine).  Patients are able to view lab/test results, encounter notes, upcoming appointments, etc.  Non-urgent messages can be sent to your provider as well.   To learn more about what you can do with MyChart, go to [email or web address].    Your next appointment:   6 month(s)  Provider:   You may see Nanetta Batty, MD or one of the following Advanced Practice Providers on your designated Care Team:   Nicolasa Ducking, NP Eula Listen, PA-C Cadence Fransico Michael, PA-C Charlsie Quest, NP

## 2022-12-12 NOTE — Progress Notes (Signed)
Cardiology Office Note:    Date:  12/12/2022   ID:  Nina Perez, DOB [birth year], MRN [id number]  PCP:  Dale [place], MD   [facility] Providers Cardiologist:  Nanetta Batty, MD     Referring MD: Dale Moran, MD   Chief Complaint  Patient presents with   New Patient (Initial Visit)    Hosp f/u post cath. Meds reviewed verbally with pt.    History of Present Illness:    Nina Perez is a 82 y.o. female with a hx of CAD s/p PCI to RV marginal branch 11/2022 (70% OM1, 80% second diagonal), PAD (occluded R. anterior tibial, R. posterior tibial arteries), left bundle branch block, hypertension, hyperlipidemia, sleep apnea presenting to establish care.  Patient presented to the hospital last month with chest pain, diagnosed with NSTEMI, left heart cath revealed significant stenosis of proximal RV branch.  Drug-eluting stent was placed.  Denies chest pain.  Tolerating aspirin, Plavix, high intensity statin/Lipitor as prescribed.  Currently undergoing rehab, denies chest pain.  Endorses swelling in her legs which worsen as the day progresses.  Echo 11/2022 EF 50 to 55%  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HCC)    a.) 2/2 scleroderma Dx   LBBB (left bundle branch block)    Long term current use of antithrombotics/antiplatelets    a.) ASA + clopidogrel   Long term current use of immunosuppressive drug    a.) MTX for RA and scleroderma Dx   OSA on CPAP    Osteoarthritis    a.) knees, spine   PAD (peripheral artery disease) (HCC)    Phlebitis    a.) x 2 (with pregnancy)   Pulmonary  fibrosis (HCC)    a.) mild   Pulmonary hypertension (HCC) 02/24/2012   a.) TTE 02/24/2012: EF 55%, RVSP 33; b.) TTE 10/12/2014: EF >55%, RVSP 36.1; c.) TTE 09/02/2017: EF 55%, RVSP 45.5   Reflux esophagitis    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 b.) renal US 05/23/2020: solid LEFT renal mass measuring 1.4 x 1.5 x 1.6. 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, sclerodactyly, telangiectasias   Skin cancer 2018   Supplemental oxygen dependent    a.) 2L/Wells PRN for DOE related to mild pulmonary fibrosis and pulmonary HTN   T2DM (type 2 diabetes mellitus) (HCC)     Past Surgical History:  Procedure Laterality Date   ABDOMINAL AORTOGRAM W/LOWER EXTREMITY Left 05/16/2019   Procedure: ABDOMINAL AORTOGRAM W/LOWER EXTREMITY;  Surgeon: Runell Gess, MD;  Location: [facility];  Service: Cardiovascular;  Laterality: Left;   AMPUTATION TOE Left 06/02/2019   Procedure: AMPUTATION TOE;  Surgeon: Recardo Evangelist, DPM;  Location: [facility];  Service: Podiatry;  Laterality:  Left;   APPENDECTOMY  1972   BREAST CYST ASPIRATION Left    neg   COLONOSCOPY WITH PROPOFOL N/A 09/26/2021   Procedure: COLONOSCOPY WITH PROPOFOL;  Surgeon: Jaynie Collins, DO;  Location: [facility];  Service: Gastroenterology;  Laterality: N/A;  DM, WHEELCHAIR, PLAVIX   CORONARY STENT INTERVENTION N/A 11/24/2022   Procedure: CORONARY STENT INTERVENTION;  Surgeon: Iran Ouch, MD;  Location: [facility];  Service: Cardiovascular;  Laterality: N/A;   ESOPHAGOGASTRODUODENOSCOPY (EGD) WITH PROPOFOL N/A 09/26/2021   Procedure: ESOPHAGOGASTRODUODENOSCOPY (EGD) WITH PROPOFOL;  Surgeon: Jaynie Collins, DO;   Location: [facility];  Service: Gastroenterology;  Laterality: N/A;   INCISION AND DRAINAGE OF WOUND Left 06/07/2019   Procedure: IRRIGATION AND DEBRIDEMENT WOUND;  Surgeon: Recardo Evangelist, DPM;  Location: [facility];  Service: Podiatry;  Laterality: Left;   KNEE ARTHROPLASTY Right 11/13/2021   Procedure: COMPUTER ASSISTED TOTAL KNEE ARTHROPLASTY;  Surgeon: Donato Heinz, MD;  Location: [facility];  Service: Orthopedics;  Laterality: Right;   LEFT HEART CATH AND CORONARY ANGIOGRAPHY Right 11/24/2022   Procedure: LEFT HEART CATH AND CORONARY ANGIOGRAPHY with possible coronary intervention;  Surgeon: Laurier Nancy, MD;  Location: [facility];  Service: Cardiovascular;  Laterality: Right;   LOWER EXTREMITY ANGIOGRAPHY Left 06/03/2019   Procedure: Lower Extremity Angiography;  Surgeon: Renford Dills, MD;  Location: [facility];  Service: Cardiovascular;  Laterality: Left;   LOWER EXTREMITY ANGIOGRAPHY Right 05/21/2021   Procedure: LOWER EXTREMITY ANGIOGRAPHY;  Surgeon: Renford Dills, MD;  Location: [facility];  Service: Cardiovascular;  Laterality: Right;   PERIPHERAL VASCULAR BALLOON ANGIOPLASTY Left 05/16/2019   Procedure: PERIPHERAL VASCULAR BALLOON ANGIOPLASTY;  Surgeon: Runell Gess, MD;  Location: [facility];  Service: Cardiovascular;  Laterality: Left;  SFA   TUBAL LIGATION  1972   VEIN LIGATION AND STRIPPING      Current Medications: Current Meds  Medication Sig   acetaminophen (TYLENOL) 650 MG CR tablet Take 650-1,300 mg by mouth every 8 (eight) hours as needed for pain.   allopurinol (ZYLOPRIM) 100 MG tablet Take 1 tablet (100 mg total) by mouth daily.   amLODipine (NORVASC) 5 MG tablet Take 1 tablet (5 mg total) by mouth daily.   aspirin EC 81 MG tablet Take 1 tablet (81 mg total) by mouth daily. Swallow whole.   atorvastatin (LIPITOR) 80 MG tablet Take 1 tablet (80 mg total) by mouth daily.   clopidogrel (PLAVIX) 75 MG tablet Take  1 tablet (75 mg total) by mouth daily with breakfast.   fexofenadine (ALLEGRA) 180 MG tablet Take 1 tablet (180 mg total) by mouth daily. (Patient taking differently: Take 180 mg by mouth daily as needed for allergies.)   folic acid (FOLVITE) 1 MG tablet Take 1 mg by mouth every evening.    furosemide (LASIX) 20 MG tablet Take 20 mg by mouth daily.   glucose blood (BAYER CONTOUR TEST) test strip USE  STRIP TO CHECK GLUCOSE TWICE DAILY. Dx: E11.9   ipratropium (ATROVENT) 0.06 % nasal spray Place 2 sprays into both nostrils 4 (four) times daily. (Patient taking differently: Place 2 sprays into both nostrils 4 (four) times daily as needed for rhinitis.)   losartan (COZAAR) 100 MG tablet Take 1 tablet (100 mg total) by mouth daily.   metFORMIN (GLUCOPHAGE) 500 MG tablet Take 1 tablet by mouth once daily   methotrexate (RHEUMATREX) 2.5 MG tablet Take 2.5 mg by mouth every Wednesday.   metoprolol  succinate (TOPROL-XL) 100 MG 24 hr tablet Take 1 tablet (100 mg total) by mouth daily. Take with or immediately following a meal.   MICROLET LANCETS MISC Check sugar once daily, [employer] Microlet Lancets. Dx E11.9   nitroGLYCERIN (NITROSTAT) 0.4 MG SL tablet Place 1 tablet (0.4 mg total) under the tongue every 5 (five) minutes x 3 doses as needed for chest pain.   pantoprazole (PROTONIX) 40 MG tablet Take 1 tablet by mouth once daily   spironolactone (ALDACTONE) 25 MG tablet Take 1 tablet (25 mg total) by mouth daily.   torsemide (DEMADEX) 20 MG tablet Take 20 mg by mouth as needed. Taking 1/2 tablet just as needed     Allergies:   Maxzide [triamterene-hctz], Tape, Cephalexin, and Hydrochlorothiazide w-triamterene   Social History   Socioeconomic History   Marital status: Married    Spouse name: [name]   Number of children: 2   Years of education: Not on file   Highest education level: Not on file  Occupational History   Not on file  Tobacco Use   Smoking status: Former    Current packs/day: 0.00     Average packs/day: 0.5 packs/day for 15.0 years (7.5 ttl pk-yrs)    Types: Cigarettes    Start date: 05/12/1973    Quit date: 05/12/1988    Years since quitting: 34.6   Smokeless tobacco: Never  Vaping Use   Vaping status: Never Used  Substance and Sexual Activity   Alcohol use: No    Alcohol/week: 0.0 standard drinks of alcohol   Drug use: No   Sexual activity: Yes  Other Topics Concern   Not on file  Social History Narrative   Lives at home with spouse [name]    Social Determinants of Health   Financial Resource Strain: Low Risk  (11/20/2021)   Overall Financial Resource Strain (CARDIA)    Difficulty of Paying Living Expenses: Not hard at all  Food Insecurity: No Food Insecurity (11/22/2022)   Hunger Vital Sign    Worried About Running Out of Food in the Last Year: Never true    Ran Out of Food in the Last Year: Never true  Transportation Needs: No Transportation Needs (11/22/2022)   PRAPARE - Administrator, Civil Service (Medical): No    Lack of Transportation (Non-Medical): No  Physical Activity: Sufficiently Active (11/20/2021)   Exercise Vital Sign    Days of Exercise per Week: 3 days    Minutes of Exercise per Session: 60 min  Stress: No Stress Concern Present (11/20/2021)   [employer] of Occupational Health - Occupational Stress Questionnaire    Feeling of Stress : Not at all  Social Connections: Socially Integrated (11/20/2021)   Social Connection and Isolation Panel [NHANES]    Frequency of Communication with Friends and Family: More than three times a week    Frequency of Social Gatherings with Friends and Family: Not on file    Attends Religious Services: More than 4 times per year    Active Member of [employer] or Organizations: Yes    Attends Engineer, structural: More than 4 times per year    Marital Status: Married     Family History: The patient's family history includes Arthritis in her mother; Cancer in her sister; Cancer - Prostate in  her brother; Heart attack in her father. There is no history of Colon cancer or Breast cancer.  ROS:   Please see the history of present illness.     All  other systems reviewed and are negative.  EKGs/Labs/Other Studies Reviewed:    The following studies were reviewed today:  EKG Interpretation Date/Time:  Friday December 12 2022 13:25:59 EDT Ventricular Rate:  57 PR Interval:  176 QRS Duration:  160 QT Interval:  454 QTC Calculation: 441 R Axis:   -75  Text Interpretation: Sinus bradycardia Left axis deviation Left bundle branch block Confirmed by Debbe Odea ([id number]) on 12/12/2022 1:33:28 PM    Recent Labs: 03/21/2022: TSH 4.73 07/10/2022: Magnesium 1.6 11/10/2022: ALT 19 11/25/2022: Hemoglobin 11.5; Platelets 129 11/27/2022: BUN 20; Creatinine, Ser 0.94; Potassium 3.6; Sodium 136  Recent Lipid Panel    Component Value Date/Time   CHOL 83 11/22/2022 0454   TRIG 66 11/22/2022 0454   HDL 33 (L) 11/22/2022 0454   CHOLHDL 2.5 11/22/2022 0454   VLDL 13 11/22/2022 0454   LDLCALC 37 11/22/2022 0454     Risk Assessment/Calculations:             Physical Exam:    VS:  BP 131/77 (BP Location: Right Arm, Patient Position: Sitting, Cuff Size: Large)   Pulse (!) 57   Ht 5\' 6"  (1.676 m)   Wt 197 lb 8 oz (89.6 kg)   LMP 05/25/1988   SpO2 95%   BMI 31.88 kg/m     Wt Readings from Last 3 Encounters:  12/12/22 197 lb 8 oz (89.6 kg)  11/25/22 239 lb 6.7 oz (108.6 kg)  11/14/22 210 lb 3.2 oz (95.3 kg)     GEN:  Well nourished, well developed in no acute distress HEENT: Normal NECK: No JVD; No carotid bruits CARDIAC: RRR, no murmurs, rubs, gallops RESPIRATORY:  Clear to auscultation without rales, wheezing or rhonchi  ABDOMEN: Soft, non-tender, non-distended MUSCULOSKELETAL:  1+ edema; varicose veins noted SKIN: Warm and dry NEUROLOGIC:  Alert and oriented x 3 PSYCHIATRIC:  Normal affect   ASSESSMENT:    1. Coronary artery disease involving native heart without  angina pectoris, unspecified vessel or lesion type   2. Primary hypertension   3. Hypercholesterolemia   4. Pre-procedural cardiovascular examination   5. Dependent edema    PLAN:    In order of problems listed above:  CAD s/p PCI to RV marginal branch 11/2022 (70% OM1, 80% second diagonal).  EF 50 to 55%.  Continue aspirin, Plavix, Lipitor 80.  Continue cardiac rehab. Hypertension, BP controlled.  Continue Norvasc 10 mg daily, losartan 100 mg daily. Hyperlipidemia, goal LDL less than 70.  LDL at goal.  Continue Lipitor 80 mg daily. Preprocedural evaluation, dental implant being considered.  Advised continuing dual antiplatelet uninterrupted at least 6 months before withholding for possible dental procedure. Dependent edema, varicose veins noted, likely from venous insufficiency.  Keeping legs in raised position while seated, compression stockings also advised.  Follow-up in 6 months.     Medication Adjustments/Labs and Tests Ordered: Current medicines are reviewed at length with the patient today.  Concerns regarding medicines are outlined above.  Orders Placed This Encounter  Procedures   EKG 12-Lead   No orders of the defined types were placed in this encounter.   Patient Instructions  Medication Instructions:   Your physician recommends that you continue on your current medications as directed. Please refer to the Current Medication list given to you today.  *If you need a refill on your cardiac medications before your next appointment, please call your pharmacy*   Lab Work:  None Ordered  If you have labs (blood work) drawn today and your  tests are completely normal, you will receive your results only by: MyChart Message (if you have MyChart) OR A paper copy in the mail If you have any lab test that is abnormal or we need to change your treatment, we will call you to review the results.   Testing/Procedures:  None Ordered    Follow-Up: At [facility], you and your health needs are our priority.  As part of our continuing mission to provide you with exceptional heart care, we have created designated Provider Care Teams.  These Care Teams include your primary Cardiologist (physician) and Advanced Practice Providers (APPs -  Physician Assistants and Nurse Practitioners) who all work together to provide you with the care you need, when you need it.  We recommend signing up for the patient portal called "MyChart".  Sign up information is provided on this After Visit Summary.  MyChart is used to connect with patients for Virtual Visits (Telemedicine).  Patients are able to view lab/test results, encounter notes, upcoming appointments, etc.  Non-urgent messages can be sent to your provider as well.   To learn more about what you can do with MyChart, go to [email or web address].    Your next appointment:   6 month(s)  Provider:   You may see Nanetta Batty, MD or one of the following Advanced Practice Providers on your designated Care Team:   Nicolasa Ducking, NP Eula Listen, PA-C Cadence Fransico Michael, PA-C Charlsie Quest, NP    Signed, Debbe Odea, MD  12/12/2022 2:16 PM    [facility]

## 2022-12-13 ENCOUNTER — Encounter: Payer: Self-pay | Admitting: Internal Medicine

## 2022-12-13 DIAGNOSIS — I2510 Atherosclerotic heart disease of native coronary artery without angina pectoris: Secondary | ICD-10-CM | POA: Insufficient documentation

## 2022-12-19 ENCOUNTER — Ambulatory Visit: Payer: Medicare Other | Admitting: Cardiology

## 2022-12-22 ENCOUNTER — Encounter: Payer: Self-pay | Admitting: Internal Medicine

## 2022-12-22 ENCOUNTER — Ambulatory Visit: Payer: Medicare Other | Admitting: Internal Medicine

## 2022-12-22 VITALS — BP 130/80 | HR 68 | Temp 97.9°F | Resp 16 | Ht 66.0 in | Wt 200.8 lb

## 2022-12-22 DIAGNOSIS — I10 Essential (primary) hypertension: Secondary | ICD-10-CM | POA: Diagnosis not present

## 2022-12-22 DIAGNOSIS — D1771 Benign lipomatous neoplasm of kidney: Secondary | ICD-10-CM

## 2022-12-22 DIAGNOSIS — M341 CR(E)ST syndrome: Secondary | ICD-10-CM

## 2022-12-22 DIAGNOSIS — K2270 Barrett's esophagus without dysplasia: Secondary | ICD-10-CM

## 2022-12-22 DIAGNOSIS — Z86010 Personal history of colonic polyps: Secondary | ICD-10-CM

## 2022-12-22 DIAGNOSIS — E1159 Type 2 diabetes mellitus with other circulatory complications: Secondary | ICD-10-CM

## 2022-12-22 DIAGNOSIS — N1831 Chronic kidney disease, stage 3a: Secondary | ICD-10-CM

## 2022-12-22 DIAGNOSIS — D696 Thrombocytopenia, unspecified: Secondary | ICD-10-CM | POA: Diagnosis not present

## 2022-12-22 DIAGNOSIS — I700 Atherosclerosis of aorta: Secondary | ICD-10-CM

## 2022-12-22 DIAGNOSIS — I272 Pulmonary hypertension, unspecified: Secondary | ICD-10-CM

## 2022-12-22 DIAGNOSIS — M0579 Rheumatoid arthritis with rheumatoid factor of multiple sites without organ or systems involvement: Secondary | ICD-10-CM

## 2022-12-22 DIAGNOSIS — E7800 Pure hypercholesterolemia, unspecified: Secondary | ICD-10-CM

## 2022-12-22 DIAGNOSIS — Z89422 Acquired absence of other left toe(s): Secondary | ICD-10-CM | POA: Diagnosis not present

## 2022-12-22 LAB — CBC WITH DIFFERENTIAL/PLATELET
Basophils Absolute: 0 10*3/uL (ref 0.0–0.1)
Basophils Relative: 0.7 % (ref 0.0–3.0)
Eosinophils Absolute: 0.1 10*3/uL (ref 0.0–0.7)
Eosinophils Relative: 1.5 % (ref 0.0–5.0)
HCT: 40.7 % (ref 36.0–46.0)
Hemoglobin: 12.6 g/dL (ref 12.0–15.0)
Lymphocytes Relative: 16.1 % (ref 12.0–46.0)
Lymphs Abs: 1.1 10*3/uL (ref 0.7–4.0)
MCHC: 30.9 g/dL (ref 30.0–36.0)
MCV: 98.6 fl (ref 78.0–100.0)
Monocytes Absolute: 0.3 10*3/uL (ref 0.1–1.0)
Monocytes Relative: 4.7 % (ref 3.0–12.0)
Neutro Abs: 5.2 10*3/uL (ref 1.4–7.7)
Neutrophils Relative %: 77 % (ref 43.0–77.0)
Platelets: 107 10*3/uL — ABNORMAL LOW (ref 150.0–400.0)
RBC: 4.13 Mil/uL (ref 3.87–5.11)
RDW: 22.1 % — ABNORMAL HIGH (ref 11.5–15.5)
WBC: 6.7 10*3/uL (ref 4.0–10.5)

## 2022-12-22 LAB — BASIC METABOLIC PANEL
BUN: 16 mg/dL (ref 6–23)
CO2: 26 mEq/L (ref 19–32)
Calcium: 9.8 mg/dL (ref 8.4–10.5)
Chloride: 102 mEq/L (ref 96–112)
Creatinine, Ser: 1.01 mg/dL (ref 0.40–1.20)
GFR: 52.18 mL/min — ABNORMAL LOW (ref >60.00)
Glucose, Bld: 80 mg/dL (ref 70–99)
Potassium: 4.2 mEq/L (ref 3.5–5.1)
Sodium: 138 mEq/L (ref 135–145)

## 2022-12-22 LAB — MAGNESIUM: Magnesium: 1.2 mg/dL — ABNORMAL LOW (ref 1.5–2.5)

## 2022-12-22 NOTE — Assessment & Plan Note (Signed)
Followed by GI.  Recommended continuing protonix daily.

## 2022-12-22 NOTE — Assessment & Plan Note (Signed)
Dr Stoioff (01/30/22) - stable.  F/u one year.  

## 2022-12-22 NOTE — Assessment & Plan Note (Signed)
On metformin.  Low carb diet and exercise.  Follow met b and a1c.   

## 2022-12-22 NOTE — Assessment & Plan Note (Signed)
On MTX.  Followed by rheumatology.

## 2022-12-22 NOTE — Assessment & Plan Note (Signed)
Continue lipitor.  Low cholesterol diet and exercise.  Follow lipid panel and liver function tests.   

## 2022-12-22 NOTE — Assessment & Plan Note (Signed)
Has had a problem with low magnesium requiring increased oral supplements.  Off magnesium supplements.  Recheck magnesium level today.

## 2022-12-22 NOTE — Assessment & Plan Note (Signed)
Continue metoprolol, losartan and amlodipine.  Spironolactone added during recent hospitalization. Follow pressures.  Follow metabolic panel.

## 2022-12-22 NOTE — Assessment & Plan Note (Signed)
Follow cbc.  

## 2022-12-22 NOTE — Assessment & Plan Note (Signed)
Followed by [facility]/rheumatology.

## 2022-12-22 NOTE — Assessment & Plan Note (Signed)
Colonoscopy 09/26/21.

## 2022-12-22 NOTE — Assessment & Plan Note (Signed)
Avoid antiinflammatories.  Stay hydrated.  Follow metabolic panel. Continue losartan.  

## 2022-12-22 NOTE — Assessment & Plan Note (Signed)
S/p amputation.  Followed by [facility].  ?

## 2022-12-22 NOTE — Progress Notes (Signed)
Subjective:    Patient ID: Nina Perez, female    DOB: [birth year], 82 y.o.   MRN: [id number]  Patient here for  Chief Complaint  Patient presents with   Medical Management of Chronic Issues    HPI Here for hospital/rehab follow up.  She is accompanied by her husband.  History obtained from both of them. Was admitted 11/21/22 - 11/27/22 - after presenting with acute onset of chest tightness.  Found to have NSTEMI. S/p cath with PCI stent M2 branch of RCA 7/15. After prolonged course of being mostly bed-bound in combination with chronic knee pain, she had significant decrease in her mobility and strength. PT/OT evaluated and recommended SNF at discharge.  PT went well in rehab.  Came home 12/18/22. Saw cardiology 12/12/22.  Tolerating aspirin, plavix and lipitor. EF 50-55%. Remains on amlodipine and losartan.  Also on spironolactone - started in hospital.  Discussed need for f/u labs given new medication.  Plans to continue home health PT.  No chest tightness.  Breathing stable.  No abdominal pain reported.     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HCC)    a.) 2/2 scleroderma Dx   LBBB (left bundle branch block)    Long term current use of antithrombotics/antiplatelets    a.) ASA + clopidogrel   Long term current use of immunosuppressive drug    a.) MTX for RA and scleroderma Dx   OSA on CPAP    Osteoarthritis    a.) knees, spine   PAD (peripheral artery disease) (HCC)    Phlebitis    a.) x 2 (with pregnancy)   Pulmonary fibrosis (HCC)    a.) mild   Pulmonary hypertension (HCC) 02/24/2012   a.) TTE  02/24/2012: EF 55%, RVSP 33; b.) TTE 10/12/2014: EF >55%, RVSP 36.1; c.) TTE 09/02/2017: EF 55%, RVSP 45.5   Reflux esophagitis    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 b.) renal US 05/23/2020: solid LEFT renal mass measuring 1.4 x 1.5 x 1.6. 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, sclerodactyly, telangiectasias   Skin cancer 2018   Supplemental oxygen dependent    a.) 2L/[place] PRN for DOE related to mild pulmonary fibrosis and pulmonary HTN   T2DM (type 2 diabetes mellitus) (HCC)    Past Surgical History:  Procedure Laterality Date   ABDOMINAL AORTOGRAM W/LOWER EXTREMITY Left 05/16/2019   Procedure: ABDOMINAL AORTOGRAM W/LOWER EXTREMITY;  Surgeon: Runell Gess, MD;  [facility]: [facility];  Service: Cardiovascular;  Laterality: Left;   AMPUTATION TOE Left 06/02/2019   Procedure: AMPUTATION TOE;  Surgeon: Recardo Evangelist, DPM;  Location: [facility];  Service: Podiatry;  Laterality: Left;   APPENDECTOMY  1972   BREAST CYST ASPIRATION  Left    neg   COLONOSCOPY WITH PROPOFOL N/A 09/26/2021   Procedure: COLONOSCOPY WITH PROPOFOL;  Surgeon: Jaynie Collins, DO;  Location: [facility] ENDOSCOPY;  Service: Gastroenterology;  Laterality: N/A;  DM, WHEELCHAIR, PLAVIX   CORONARY STENT INTERVENTION N/A 11/24/2022   Procedure: CORONARY STENT INTERVENTION;  Surgeon: Iran Ouch, MD;  Location: [facility];  Service: Cardiovascular;  Laterality: N/A;   ESOPHAGOGASTRODUODENOSCOPY (EGD) WITH PROPOFOL N/A 09/26/2021   Procedure: ESOPHAGOGASTRODUODENOSCOPY (EGD) WITH PROPOFOL;  Surgeon: Jaynie Collins, DO;  Location: [facility] ENDOSCOPY;  Service: Gastroenterology;  Laterality: N/A;   INCISION  AND DRAINAGE OF WOUND Left 06/07/2019   Procedure: IRRIGATION AND DEBRIDEMENT WOUND;  Surgeon: Recardo Evangelist, DPM;  Location: [facility] OR[facility];  Service: Podiatry;  Laterality: Left;   KNEE ARTHROPLASTY Right 11/13/2021   Procedure: COMPUTER ASSISTED TOTAL KNEE ARTHROPLASTY;  Surgeon: Donato Heinz, MD;  Location: [facility] OR[facility];  Service: Orthopedics;  Laterality: Right;   LEFT HEART CATH AND CORONARY ANGIOGRAPHY Right 11/24/2022   Procedure: LEFT HEART CATH AND CORONARY ANGIOGRAPHY with possible coronary intervention;  Surgeon: Laurier Nancy, MD;  Location: [facility];  Service: Cardiovascular;  Laterality: Right;   LOWER EXTREMITY ANGIOGRAPHY Left 06/03/2019   Procedure: Lower Extremity Angiography;  Surgeon: Renford Dills, MD;  Location: [facility];  Service: Cardiovascular;  Laterality: Left;   LOWER EXTREMITY ANGIOGRAPHY Right 05/21/2021   Procedure: LOWER EXTREMITY ANGIOGRAPHY;  Surgeon: Renford Dills, MD;  Location: [facility];  Service: Cardiovascular;  Laterality: Right;   PERIPHERAL VASCULAR BALLOON ANGIOPLASTY Left 05/16/2019   Procedure: PERIPHERAL VASCULAR BALLOON ANGIOPLASTY;  Surgeon: Runell Gess, MD;  Location: [facility];  Service: Cardiovascular;  Laterality: Left;  SFA   TUBAL LIGATION  1972   VEIN LIGATION AND STRIPPING     Family History  Problem Relation Age of Onset   Arthritis Mother    Heart attack Father    Cancer Sister    Cancer - Prostate Brother    Colon cancer Neg Hx    Breast cancer Neg Hx    Social History   Socioeconomic History   Marital status: Married    Spouse name: [name]   Number of children: 2   Years of education: Not on file   Highest education level: Not on file  Occupational History   Not on file  Tobacco Use   Smoking status: Former    Current packs/day: 0.00    Average packs/day: 0.5 packs/day for 15.0 years (7.5 ttl pk-yrs)    Types: Cigarettes    Start date: 05/12/1973    Quit date:  05/12/1988    Years since quitting: 34.6   Smokeless tobacco: Never  Vaping Use   Vaping status: Never Used  Substance and Sexual Activity   Alcohol use: No    Alcohol/week: 0.0 standard drinks of alcohol   Drug use: No   Sexual activity: Yes  Other Topics Concern   Not on file  Social History Narrative   Lives at home with spouse [name]    Social Determinants of Health   Financial Resource Strain: Low Risk  (11/20/2021)   Overall Financial Resource Strain (CARDIA)    Difficulty of Paying Living Expenses: Not hard at all  Food Insecurity: No Food Insecurity (11/22/2022)   Hunger Vital Sign    Worried About Running Out of Food in the Last Year: Never true    Ran Out of Food in the Last Year: Never  true  Transportation Needs: No Transportation Needs (11/22/2022)   PRAPARE - Administrator, Civil Service (Medical): No    Lack of Transportation (Non-Medical): No  Physical Activity: Sufficiently Active (11/20/2021)   Exercise Vital Sign    Days of Exercise per Week: 3 days    Minutes of Exercise per Session: 60 min  Stress: No Stress Concern Present (11/20/2021)   [employer] of Occupational Health - Occupational Stress Questionnaire    Feeling of Stress : Not at all  Social Connections: Socially Integrated (11/20/2021)   Social Connection and Isolation Panel [NHANES]    Frequency of Communication with Friends and Family: More than three times a week    Frequency of Social Gatherings with Friends and Family: Not on file    Attends Religious Services: More than 4 times per year    Active Member of [employer] or Organizations: Yes    Attends Engineer, structural: More than 4 times per year    Marital Status: Married     Review of Systems  Constitutional:  Negative for appetite change and unexpected weight change.  HENT:  Negative for congestion.   Respiratory:  Negative for cough and chest tightness.        Breathing stable.   Cardiovascular:  Negative for  chest pain and palpitations.       Some pedal edema.  Reports better - when compared to swelling at cardiology appt.   Genitourinary:  Negative for difficulty urinating and dysuria.  Musculoskeletal:  Negative for joint swelling and myalgias.  Skin:  Negative for color change and rash.  Neurological:  Negative for dizziness and headaches.  Psychiatric/Behavioral:  Negative for agitation and dysphoric mood.        Objective:     BP 130/80   Pulse 68   Temp 97.9 F (36.6 C)   Resp 16   Ht 5\' 6"  (1.676 m)   Wt 200 lb 12.8 oz (91.1 kg)   LMP 05/25/1988   SpO2 97%   BMI 32.41 kg/m  Wt Readings from Last 3 Encounters:  12/22/22 200 lb 12.8 oz (91.1 kg)  12/12/22 197 lb 8 oz (89.6 kg)  11/25/22 239 lb 6.7 oz (108.6 kg)    Physical Exam Vitals reviewed.  Constitutional:      General: She is not in acute distress.    Appearance: Normal appearance.  HENT:     Head: Normocephalic and atraumatic.     Right Ear: External ear normal.     Left Ear: External ear normal.  Eyes:     General: No scleral icterus.       Right eye: No discharge.        Left eye: No discharge.     Conjunctiva/sclera: Conjunctivae normal.  Neck:     Thyroid: No thyromegaly.  Cardiovascular:     Rate and Rhythm: Normal rate and regular rhythm.  Pulmonary:     Effort: No respiratory distress.     Breath sounds: Normal breath sounds. No wheezing.  Abdominal:     General: Bowel sounds are normal.     Palpations: Abdomen is soft.     Tenderness: There is no abdominal tenderness.  Musculoskeletal:        General: No tenderness.     Cervical back: Neck supple. No tenderness.     Comments: Pedal edema  Lymphadenopathy:     Cervical: No cervical adenopathy.  Skin:    Findings: No erythema or rash.  Neurological:  Mental Status: She is alert.  Psychiatric:        Mood and Affect: Mood normal.        Behavior: Behavior normal.      Outpatient Encounter Medications as of 12/22/2022  Medication  Sig   acetaminophen (TYLENOL) 650 MG CR tablet Take 650-1,300 mg by mouth every 8 (eight) hours as needed for pain.   allopurinol (ZYLOPRIM) 100 MG tablet Take 1 tablet (100 mg total) by mouth daily.   amLODipine (NORVASC) 5 MG tablet Take 1 tablet (5 mg total) by mouth daily.   aspirin EC 81 MG tablet Take 1 tablet (81 mg total) by mouth daily. Swallow whole.   atorvastatin (LIPITOR) 80 MG tablet Take 1 tablet (80 mg total) by mouth daily.   clopidogrel (PLAVIX) 75 MG tablet Take 1 tablet (75 mg total) by mouth daily with breakfast.   fexofenadine (ALLEGRA) 180 MG tablet Take 1 tablet (180 mg total) by mouth daily. (Patient taking differently: Take 180 mg by mouth daily as needed for allergies.)   folic acid (FOLVITE) 1 MG tablet Take 1 mg by mouth every evening.    glucose blood (BAYER CONTOUR TEST) test strip USE  STRIP TO CHECK GLUCOSE TWICE DAILY. Dx: E11.9   ipratropium (ATROVENT) 0.06 % nasal spray Place 2 sprays into both nostrils 4 (four) times daily. (Patient taking differently: Place 2 sprays into both nostrils 4 (four) times daily as needed for rhinitis.)   losartan (COZAAR) 100 MG tablet Take 1 tablet (100 mg total) by mouth daily.   metFORMIN (GLUCOPHAGE) 500 MG tablet Take 1 tablet by mouth once daily   methotrexate (RHEUMATREX) 2.5 MG tablet Take 2.5 mg by mouth every Wednesday.   metoprolol succinate (TOPROL-XL) 100 MG 24 hr tablet Take 1 tablet (100 mg total) by mouth daily. Take with or immediately following a meal.   MICROLET LANCETS MISC Check sugar once daily, [employer] Microlet Lancets. Dx E11.9   nitroGLYCERIN (NITROSTAT) 0.4 MG SL tablet Place 1 tablet (0.4 mg total) under the tongue every 5 (five) minutes x 3 doses as needed for chest pain.   pantoprazole (PROTONIX) 40 MG tablet Take 1 tablet by mouth once daily   spironolactone (ALDACTONE) 25 MG tablet Take 1 tablet (25 mg total) by mouth daily.   torsemide (DEMADEX) 20 MG tablet Take 20 mg by mouth as needed. Taking 1/2  tablet just as needed   [DISCONTINUED] furosemide (LASIX) 20 MG tablet Take 20 mg by mouth daily.   No facility-administered encounter medications on file as of 12/22/2022.     Lab Results  Component Value Date   WBC 6.7 12/22/2022   HGB 12.6 12/22/2022   HCT 40.7 12/22/2022   PLT 107.0 (L) 12/22/2022   GLUCOSE 80 12/22/2022   CHOL 83 11/22/2022   TRIG 66 11/22/2022   HDL 33 (L) 11/22/2022   LDLCALC 37 11/22/2022   ALT 19 11/10/2022   AST 27 11/10/2022   NA 138 12/22/2022   K 4.2 12/22/2022   CL 102 12/22/2022   CREATININE 1.01 12/22/2022   BUN 16 12/22/2022   CO2 26 12/22/2022   TSH 4.73 03/21/2022   INR 1.1 11/21/2022   HGBA1C 5.8 11/10/2022   MICROALBUR 17.6 (H) 12/19/2021    CARDIAC CATHETERIZATION  Result Date: 11/24/2022   RV Branch lesion is 90% stenosed.   Prox RCA lesion is 40% stenosed.   1st Mrg lesion is 55% stenosed.   1st Diag lesion is 60% stenosed.   The left  ventricular systolic function is normal.   LV end diastolic pressure is mildly elevated.   The left ventricular ejection fraction is 50-55% by visual estimate.   There is trivial (1+) mitral regurgitation.   Recommend dual antiplatelet therapy with Aspirin 81mg  daily and Clopidogrel 75mg  daily. Patient had non-STEMI with troponin as high as 13,000.  Culprit lesion probably is marginal branch of right coronary artery as there is a high-grade lesion about 90%.  Advise PCI and stenting of the marginal branch of right coronary artery.  LVEF was 53% on echocardiogram with wall motion abnormality in the septum as well as anterior wall most likely due to left bundle branch block.   CARDIAC CATHETERIZATION  Result Date: 11/24/2022   1st Mrg lesion is 70% stenosed.   2nd Diag lesion is 80% stenosed.   RV Branch lesion is 90% stenosed.   A drug-eluting stent was successfully placed using a STENT ONYX FRONTIER 2.75X22.   Post intervention, there is a 0% residual stenosis. Successful angioplasty and drug-eluting stent  placement to proximal RV marginal of the right coronary artery which is large in size and supplies the right PDA distribution. Recommendations: Dual antiplatelet therapy for 12 months. Aggressive treatment of risk factors. The second diagonal is diseased but relatively small in size.  No PCI is recommended. I increased Toprol for better blood pressure control.   ECHOCARDIOGRAM COMPLETE  Result Date: 11/23/2022    ECHOCARDIOGRAM REPORT   Patient Name:   Nina Perez Date of Exam: 11/23/2022 Medical Rec #:  [id number]    Height:       66.0 in Accession #:    [id number]   Weight:       210.1 lb Date of Birth:  10-29-1940    BSA:          2.042 m Patient Age:    82 years     BP:           168/68 mmHg Patient Gender: F            HR:           77 bpm. Exam Location:  [facility] Procedure: 2D Echo and Intracardiac Opacification Agent Indications:     NSTEMI I21.4  History:         Patient has no prior history of Echocardiogram examinations.  Sonographer:     Overton Mam RDCS, FASE Referring Phys:  [id number] [name] Diagnosing Phys: [name]  Sonographer Comments: Technically difficult study due to poor echo windows and no subcostal window. Image acquisition challenging due to patient body habitus. IMPRESSIONS  1. Left ventricular ejection fraction, by estimation, is 50 to 55%. The left ventricle has low normal function. The left ventricle demonstrates regional wall motion abnormalities (see scoring diagram/findings for description). Left ventricular diastolic  parameters are consistent with Grade III diastolic dysfunction (restrictive).  2. Right ventricular systolic function is normal. The right ventricular size is mildly enlarged.  3. Left atrial size was moderately dilated.  4. Right atrial size was moderately dilated.  5. The mitral valve is normal in structure. Mild mitral valve regurgitation. No evidence of mitral stenosis.  6. The aortic valve is normal in structure. Aortic valve regurgitation is mild.  Aortic valve sclerosis is present, with no evidence of aortic valve stenosis.  7. The inferior vena cava is normal in size with greater than 50% respiratory variability, suggesting right atrial pressure of 3 mmHg. FINDINGS  Left Ventricle: Left ventricular ejection fraction, by estimation, is  50 to 55%. The left ventricle has low normal function. The left ventricle demonstrates regional wall motion abnormalities. Definity contrast agent was given IV to delineate the left ventricular endocardial borders. The left ventricular internal cavity size was normal in size. There is no left ventricular hypertrophy. Left ventricular diastolic parameters are consistent with Grade III diastolic dysfunction (restrictive).  LV Wall Scoring: The inferior septum is akinetic. The anterior septum is hypokinetic. Right Ventricle: The right ventricular size is mildly enlarged. No increase in right ventricular wall thickness. Right ventricular systolic function is normal. Left Atrium: Left atrial size was moderately dilated. Right Atrium: Right atrial size was moderately dilated. Pericardium: There is no evidence of pericardial effusion. Mitral Valve: The mitral valve is normal in structure. Mild mitral valve regurgitation. No evidence of mitral valve stenosis. Tricuspid Valve: The tricuspid valve is normal in structure. Tricuspid valve regurgitation is mild . No evidence of tricuspid stenosis. Aortic Valve: The aortic valve is normal in structure. Aortic valve regurgitation is mild. Aortic valve sclerosis is present, with no evidence of aortic valve stenosis. Aortic valve mean gradient measures 11.5 mmHg. Aortic valve peak gradient measures 19.3 mmHg. Aortic valve area, by VTI measures 1.21 cm. Pulmonic Valve: The pulmonic valve was normal in structure. Pulmonic valve regurgitation is not visualized. No evidence of pulmonic stenosis. Aorta: The aortic root is normal in size and structure. Venous: The inferior vena cava is normal in  size with greater than 50% respiratory variability, suggesting right atrial pressure of 3 mmHg. IAS/Shunts: No atrial level shunt detected by color flow Doppler.  LEFT VENTRICLE PLAX 2D LVIDd:         5.30 cm      Diastology LVIDs:         3.90 cm      LV e' medial:    5.44 cm/s LV PW:         1.45 cm      LV E/e' medial:  17.8 LV IVS:        1.15 cm      LV e' lateral:   5.00 cm/s LVOT diam:     1.80 cm      LV E/e' lateral: 19.4 LV SV:         55 LV SV Index:   27 LVOT Area:     2.54 cm  LV Volumes (MOD) LV vol d, MOD A2C: 158.0 ml LV vol d, MOD A4C: 115.8 ml LV vol s, MOD A2C: 62.0 ml LV vol s, MOD A4C: 55.5 ml LV SV MOD A2C:     96.0 ml LV SV MOD A4C:     115.8 ml LV SV MOD BP:      75.9 ml RIGHT VENTRICLE RV Basal diam:  3.10 cm RV S prime:     17.00 cm/s TAPSE (M-mode): 2.9 cm LEFT ATRIUM             Index        RIGHT ATRIUM           Index LA diam:        5.00 cm 2.45 cm/m   RA Area:     10.30 cm LA Vol (A2C):   44.7 ml 21.89 ml/m  RA Volume:   21.30 ml  10.43 ml/m LA Vol (A4C):   74.6 ml 36.53 ml/m LA Biplane Vol: 60.8 ml 29.77 ml/m  AORTIC VALVE  PULMONIC VALVE AV Area (Vmax):    1.34 cm      PV Vmax:       1.25 m/s AV Area (Vmean):   1.31 cm      PV Peak grad:  6.2 mmHg AV Area (VTI):     1.21 cm AV Vmax:           219.67 cm/s AV Vmean:          157.500 cm/s AV VTI:            0.459 m AV Peak Grad:      19.3 mmHg AV Mean Grad:      11.5 mmHg LVOT Vmax:         116.00 cm/s LVOT Vmean:        80.800 cm/s LVOT VTI:          0.218 m LVOT/AV VTI ratio: 0.47  AORTA Ao Root diam: 3.40 cm Ao Asc diam:  2.90 cm MITRAL VALVE MV Area (PHT): 5.79 cm     SHUNTS MV Decel Time: 131 msec     Systemic VTI:  0.22 m MV E velocity: 96.90 cm/s   Systemic Diam: 1.80 cm MV A velocity: 149.00 cm/s MV E/A ratio:  0.65 Shaukat Khan Electronically signed by [name] Signature Date/Time: 11/23/2022/1:02:50 PM    Final        Assessment & Plan:  Primary hypertension Assessment & Plan: Continue  metoprolol, losartan and amlodipine.  Spironolactone added during recent hospitalization. Follow pressures.  Follow metabolic panel.   Orders: -     Basic metabolic panel  Hypomagnesemia Assessment & Plan: Has had a problem with low magnesium requiring increased oral supplements.  Off magnesium supplements.  Recheck magnesium level today.    Orders: -     Magnesium  Thrombocytopenia (HCC) Assessment & Plan: Follow cbc.   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  Followed by [facility].    Aortic atherosclerosis (HCC) Assessment & Plan: Continue lipitor.    Barrett's esophagus without dysplasia Assessment & Plan: Followed by GI.  Recommended continuing protonix daily.    Stage 3a chronic kidney disease (HCC) Assessment & Plan: Avoid antiinflammatories.  Stay hydrated.  Follow metabolic panel. Continue losartan.    CREST variant of scleroderma (HCC) Assessment & Plan: Followed by [facility]/rheumatology.    Type 2 diabetes mellitus with other circulatory complication, without long-term current use of insulin (HCC) Assessment & Plan: On metformin.  Low carb diet and exercise.  Follow met b and a1c.     Rheumatoid arthritis involving multiple sites with positive rheumatoid factor (HCC) Assessment & Plan: On MTX.  Followed by rheumatology.    Renal angiolipoma Assessment & Plan: Dr Lonna Cobb (01/30/22) - stable.  F/u one year.    Pulmonary hypertension, mild (HCC) Assessment & Plan: Continue f/u with pulmonary/cardiology.    Hypercholesterolemia Assessment & Plan: Continue lipitor.  Low cholesterol diet and exercise.  Follow lipid panel and liver function tests.     History of colon polyps Assessment & Plan: Colonoscopy 09/26/21.       Dale Salem, MD

## 2022-12-22 NOTE — Assessment & Plan Note (Signed)
Continue lipitor  ?

## 2022-12-22 NOTE — Assessment & Plan Note (Signed)
Continue f/u with pulmonary/cardiology.

## 2022-12-23 ENCOUNTER — Telehealth: Payer: Self-pay

## 2022-12-23 NOTE — Telephone Encounter (Signed)
Order faxed to [facility]

## 2022-12-23 NOTE — Telephone Encounter (Signed)
Angie called from [facility] regarding a fax she sent yesterday.  She would like to be sure it was received.

## 2022-12-25 ENCOUNTER — Other Ambulatory Visit: Payer: Self-pay | Admitting: Internal Medicine

## 2022-12-25 ENCOUNTER — Other Ambulatory Visit: Payer: Self-pay

## 2022-12-25 DIAGNOSIS — D696 Thrombocytopenia, unspecified: Secondary | ICD-10-CM

## 2022-12-25 DIAGNOSIS — R944 Abnormal results of kidney function studies: Secondary | ICD-10-CM

## 2022-12-25 NOTE — Progress Notes (Signed)
Order placed for abdominal ultrasound.   

## 2022-12-29 ENCOUNTER — Other Ambulatory Visit: Payer: Self-pay | Admitting: Internal Medicine

## 2022-12-29 NOTE — Telephone Encounter (Signed)
Last fill for spironolactone 25mg  was by ED provider.  I cannot refill without approval.  Sent to Dr. Birdie Sons as doc of day since Dr. Lorin Picket is out of the office.  Called and spoke with pt, she is completely out of medication.

## 2022-12-29 NOTE — Telephone Encounter (Signed)
Prescription Request  12/29/2022  LOV: 12/22/2022  What is the name of the medication or equipment? spironolactone   Have you contacted your pharmacy to request a refill? no  Which pharmacy would you like this sent to? [facility]   Patient notified that their request is being sent to the clinical staff for review and that they should receive a response within 2 business days.   Please advise at Mobile [phone] (mobile)

## 2022-12-30 ENCOUNTER — Telehealth: Payer: Self-pay

## 2022-12-30 MED ORDER — SPIRONOLACTONE 25 MG PO TABS: 25.0000 mg | ORAL_TABLET | Freq: Every day | ORAL | 2 refills | Status: DC

## 2022-12-30 NOTE — Telephone Encounter (Signed)
Noted  

## 2022-12-30 NOTE — Telephone Encounter (Signed)
Pt is calling about med refill and pt is completely out

## 2022-12-30 NOTE — Telephone Encounter (Signed)
Rx sent in for spironolactone.  Message stated she was completely out of medication.

## 2022-12-30 NOTE — Addendum Note (Signed)
Addended by: Charm Barges on: 12/30/2022 02:42 PM   Modules accepted: Orders

## 2022-12-30 NOTE — Telephone Encounter (Signed)
Patient states they gave her this medication while she was in the hospital and she was told her PCP can refill it for her.  Prescription Request  12/30/2022  LOV: Visit date not found  What is the name of the medication or equipment? spironolactone (ALDACTONE) 25 MG tablet  Have you contacted your pharmacy to request a refill? No   Which pharmacy would you like this sent to?  [facility] [address], [place] - [address] [place] [address] Phone: [phone] Fax: [phone]    Patient notified that their request is being sent to the clinical staff for review and that they should receive a response within 2 business days.   Please advise at [facility] [phone]  Patient states she is out of this medication.

## 2022-12-31 ENCOUNTER — Ambulatory Visit: Payer: Medicare Other

## 2023-01-07 ENCOUNTER — Other Ambulatory Visit (INDEPENDENT_AMBULATORY_CARE_PROVIDER_SITE_OTHER): Payer: Medicare Other

## 2023-01-07 DIAGNOSIS — D696 Thrombocytopenia, unspecified: Secondary | ICD-10-CM | POA: Diagnosis not present

## 2023-01-07 DIAGNOSIS — R944 Abnormal results of kidney function studies: Secondary | ICD-10-CM | POA: Diagnosis not present

## 2023-01-07 LAB — CBC WITH DIFFERENTIAL/PLATELET
Basophils Absolute: 0 10*3/uL (ref 0.0–0.1)
Basophils Relative: 0.7 % (ref 0.0–3.0)
Eosinophils Absolute: 0 10*3/uL (ref 0.0–0.7)
Eosinophils Relative: 1.1 % (ref 0.0–5.0)
HCT: 38.9 % (ref 36.0–46.0)
Hemoglobin: 11.9 g/dL — ABNORMAL LOW (ref 12.0–15.0)
Lymphocytes Relative: 26.1 % (ref 12.0–46.0)
Lymphs Abs: 1.1 10*3/uL (ref 0.7–4.0)
MCHC: 30.7 g/dL (ref 30.0–36.0)
MCV: 100.4 fl — ABNORMAL HIGH (ref 78.0–100.0)
Monocytes Absolute: 0.5 10*3/uL (ref 0.1–1.0)
Monocytes Relative: 11.1 % (ref 3.0–12.0)
Neutro Abs: 2.7 10*3/uL (ref 1.4–7.7)
Neutrophils Relative %: 61 % (ref 43.0–77.0)
Platelets: 151 10*3/uL (ref 150.0–400.0)
RBC: 3.87 Mil/uL (ref 3.87–5.11)
RDW: 21.8 % — ABNORMAL HIGH (ref 11.5–15.5)
WBC: 4.4 10*3/uL (ref 4.0–10.5)

## 2023-01-07 LAB — BASIC METABOLIC PANEL
BUN: 19 mg/dL (ref 6–23)
CO2: 28 mEq/L (ref 19–32)
Calcium: 10.1 mg/dL (ref 8.4–10.5)
Chloride: 105 mEq/L (ref 96–112)
Creatinine, Ser: 1.04 mg/dL (ref 0.40–1.20)
GFR: 50.36 mL/min — ABNORMAL LOW (ref >60.00)
Glucose, Bld: 144 mg/dL — ABNORMAL HIGH (ref 70–99)
Potassium: 4.2 mEq/L (ref 3.5–5.1)
Sodium: 140 mEq/L (ref 135–145)

## 2023-01-07 LAB — MAGNESIUM: Magnesium: 1.5 mg/dL (ref 1.5–2.5)

## 2023-01-08 ENCOUNTER — Other Ambulatory Visit: Payer: Self-pay | Admitting: *Deleted

## 2023-01-08 DIAGNOSIS — R944 Abnormal results of kidney function studies: Secondary | ICD-10-CM

## 2023-01-14 ENCOUNTER — Encounter: Payer: Self-pay | Admitting: Nurse Practitioner

## 2023-01-14 ENCOUNTER — Encounter: Payer: Self-pay | Admitting: Emergency Medicine

## 2023-01-14 ENCOUNTER — Ambulatory Visit (INDEPENDENT_AMBULATORY_CARE_PROVIDER_SITE_OTHER): Payer: Medicare Other | Admitting: Nurse Practitioner

## 2023-01-14 VITALS — Ht 66.0 in | Wt 200.8 lb

## 2023-01-14 DIAGNOSIS — U071 COVID-19: Secondary | ICD-10-CM | POA: Diagnosis not present

## 2023-01-14 NOTE — Progress Notes (Addendum)
Virtual telephone visit    Virtual Visit via Telephone Note   Patient location: Home. Patient and provider in visit Provider location: Office  I discussed the limitations of evaluation and management by telemedicine and the availability of in person appointments. The patient expressed understanding and agreed to proceed.   Visit Date: 01/14/2023  Today's healthcare provider: Bethanie Dicker, NP     Subjective:    Patient ID: Nina Perez, female    DOB: 10-21-1940, 82 y.o.   MRN: [id number]  Chief Complaint  Patient presents with  . Covid Positive    Tested Sunday morning was positive. Cough, sore throat (couldnt swallow), body aches. Symptoms started either Thursday or Friday.     HPI Patient with symptoms that started approximately 6 days ago. She tested positive for COVID at home on Sunday. Her symptoms have been gradually improving since that time, she reports today being the best she has felt.   Respiratory illness:  Cough- Yes  Congestion-    Sinus- Yes, nasal   Chest- Yes  Post nasal drip- Yes  Sore throat- Yes  Shortness of breath- No  Fever- No  Fatigue/Myalgia- Yes Headache- Yes Nausea/Vomiting- No Taste disturbance- No  Smell disturbance- No  Covid exposure- No  Covid vaccination- x 5  Flu vaccination- UTD  Medications- Tylenol, Coricidin  Past Medical History:  Diagnosis Date  . Adenomatous colon polyp   . Aortic atherosclerosis (HCC)   . Barrett's esophagus   . Cardiac murmur   . CKD (chronic kidney disease), stage III (HCC)   . Dyspnea on exertion   . GERD (gastroesophageal reflux disease)   . Gout   . Hiatal hernia   . History of 2019 novel coronavirus disease (COVID-19) 08/2020  . Hypercholesterolemia   . Hypertension   . ILD (interstitial lung disease) (HCC)    a.) 2/2 scleroderma Dx  . LBBB (left bundle branch block)   . Long term current use of antithrombotics/antiplatelets    a.) ASA + clopidogrel  . Long term current use of  immunosuppressive drug    a.) MTX for RA and scleroderma Dx  . OSA on CPAP   . Osteoarthritis    a.) knees, spine  . PAD (peripheral artery disease) (HCC)   . Phlebitis    a.) x 2 (with pregnancy)  . Pulmonary fibrosis (HCC)    a.) mild  . Pulmonary hypertension (HCC) 02/24/2012   a.) TTE 02/24/2012: EF 55%, RVSP 33; b.) TTE 10/12/2014: EF >55%, RVSP 36.1; c.) TTE 09/02/2017: EF 55%, RVSP 45.5  . Reflux esophagitis   . 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 b.) renal US 05/23/2020: solid LEFT renal mass measuring 1.4 x 1.5 x 1.6. c.) MRI abd 12/04/2020: approx 1.2 cm fat signal lesion c/w benign angiolipoma  . Rheumatoid arthritis    a.) positive  RF, FANA, RNP; negative CCP Ab and anti DNA; neg anti-SCL 70  . 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 Skin cancer 2018  . Supplemental oxygen dependent    a.) 2L/[place] PRN for DOE related to mild pulmonary fibrosis and pulmonary HTN  . T2DM (type 2 diabetes mellitus) (HCC)     Past Surgical History:  Procedure Laterality Date  . ABDOMINAL AORTOGRAM W/LOWER EXTREMITY Left 05/16/2019   Procedure: ABDOMINAL AORTOGRAM W/LOWER EXTREMITY;  Surgeon: Runell Gess, MD;  Location: [facility] INVASIVE  CV LAB;  Service: Cardiovascular;  Laterality: Left;  . AMPUTATION TOE Left 06/02/2019   Procedure: AMPUTATION TOE;  Surgeon: Recardo Evangelist, DPM;  Location: [facility] ORS;  Service: Podiatry;  Laterality: Left;  . APPENDECTOMY  1972  . BREAST CYST ASPIRATION Left    neg  . COLONOSCOPY WITH PROPOFOL N/A 09/26/2021   Procedure: COLONOSCOPY WITH PROPOFOL;  Surgeon: Jaynie Collins, DO;  Location: [facility] ENDOSCOPY;  Service: Gastroenterology;  Laterality: N/A;  DM, WHEELCHAIR, PLAVIX  . CORONARY STENT INTERVENTION N/A 11/24/2022   Procedure: CORONARY STENT INTERVENTION;  Surgeon: Iran Ouch, MD;   Location: [facility] INVASIVE CV LAB;  Service: Cardiovascular;  Laterality: N/A;  . ESOPHAGOGASTRODUODENOSCOPY (EGD) WITH PROPOFOL N/A 09/26/2021   Procedure: ESOPHAGOGASTRODUODENOSCOPY (EGD) WITH PROPOFOL;  Surgeon: Jaynie Collins, DO;  Location: [facility] ENDOSCOPY;  Service: Gastroenterology;  Laterality: N/A;  . INCISION AND DRAINAGE OF WOUND Left 06/07/2019   Procedure: IRRIGATION AND DEBRIDEMENT WOUND;  Surgeon: Recardo Evangelist, DPM;  Location: [facility] ORS;  Service: Podiatry;  Laterality: Left;  . KNEE ARTHROPLASTY Right 11/13/2021   Procedure: COMPUTER ASSISTED TOTAL KNEE ARTHROPLASTY;  Surgeon: Donato Heinz, MD;  Location: [facility] ORS;  Service: Orthopedics;  Laterality: Right;  . LEFT HEART CATH AND CORONARY ANGIOGRAPHY Right 11/24/2022   Procedure: LEFT HEART CATH AND CORONARY ANGIOGRAPHY with possible coronary intervention;  Surgeon: Laurier Nancy, MD;  Location: [facility] INVASIVE CV LAB;  Service: Cardiovascular;  Laterality: Right;  . LOWER EXTREMITY ANGIOGRAPHY Left 06/03/2019   Procedure: Lower Extremity Angiography;  Surgeon: Renford Dills, MD;  Location: [facility] INVASIVE CV LAB;  Service: Cardiovascular;  Laterality: Left;  . LOWER EXTREMITY ANGIOGRAPHY Right 05/21/2021   Procedure: LOWER EXTREMITY ANGIOGRAPHY;  Surgeon: Renford Dills, MD;  Location: [facility] INVASIVE CV LAB;  Service: Cardiovascular;  Laterality: Right;  . PERIPHERAL VASCULAR BALLOON ANGIOPLASTY Left 05/16/2019   Procedure: PERIPHERAL VASCULAR BALLOON ANGIOPLASTY;  Surgeon: Runell Gess, MD;  Location: [facility] INVASIVE CV LAB;  Service: Cardiovascular;  Laterality: Left;  SFA  . TUBAL LIGATION  1972  . VEIN LIGATION AND STRIPPING      Family History  Problem Relation Age of Onset  . Arthritis Mother   . Heart attack Father   . Cancer Sister   . Cancer - Prostate Brother   . Colon cancer Neg Hx   . Breast cancer Neg Hx     Social History   Socioeconomic History  . Marital status: Married    Spouse name:  Christen Bame  . Number of children: 2  . Years of education: Not on file  . Highest education level: Not on file  Occupational History  . Not on file  Tobacco Use  . Smoking status: Former    Current packs/day: 0.00    Average packs/day: 0.5 packs/day for 15.0 years (7.5 ttl pk-yrs)    Types: Cigarettes    Start date: 05/12/1973    Quit date: 05/12/1988    Years since quitting: 34.6  . Smokeless tobacco: Never  Vaping Use  . Vaping status: Never Used  Substance and Sexual Activity  . Alcohol use: No    Alcohol/week: 0.0 standard drinks of alcohol  . Drug use: No  . Sexual activity: Yes  Other Topics Concern  . Not on file  Social History Narrative   Lives at home with spouse Christen Bame    Social Determinants of Health   Financial Resource Strain: Low Risk  (11/20/2021)   Overall Financial Resource Strain (CARDIA)   . Difficulty of  Paying Living Expenses: Not hard at all  Food Insecurity: No Food Insecurity (11/22/2022)   Hunger Vital Sign   . Worried About Programme researcher, broadcasting/film/video in the Last Year: Never true   . Ran Out of Food in the Last Year: Never true  Transportation Needs: No Transportation Needs (11/22/2022)   PRAPARE - Transportation   . Lack of Transportation (Medical): No   . Lack of Transportation (Non-Medical): No  Physical Activity: Sufficiently Active (11/20/2021)   Exercise Vital Sign   . Days of Exercise per Week: 3 days   . Minutes of Exercise per Session: 60 min  Stress: No Stress Concern Present (11/20/2021)   [employer] of Occupational Health - Occupational Stress Questionnaire   . Feeling of Stress : Not at all  Social Connections: Socially Integrated (11/20/2021)   Social Connection and Isolation Panel [NHANES]   . Frequency of Communication with Friends and Family: More than three times a week   . Frequency of Social Gatherings with Friends and Family: Not on file   . Attends Religious Services: More than 4 times per year   . Active Member of Clubs or  Organizations: Yes   . Attends Banker Meetings: More than 4 times per year   . Marital Status: Married  Catering manager Violence: Not At Risk (11/22/2022)   Humiliation, Afraid, Rape, and Kick questionnaire   . Fear of Current or Ex-Partner: No   . Emotionally Abused: No   . Physically Abused: No   . Sexually Abused: No    Outpatient Medications Prior to Visit  Medication Sig Dispense Refill  . acetaminophen (TYLENOL) 650 MG CR tablet Take 650-1,300 mg by mouth every 8 (eight) hours as needed for pain.    Marland Kitchen allopurinol (ZYLOPRIM) 100 MG tablet Take 1 tablet (100 mg total) by mouth daily. 90 tablet 1  . amLODipine (NORVASC) 5 MG tablet Take 1 tablet (5 mg total) by mouth daily. 30 tablet 1  . aspirin EC 81 MG tablet Take 1 tablet (81 mg total) by mouth daily. Swallow whole. 30 tablet 12  . atorvastatin (LIPITOR) 80 MG tablet Take 1 tablet (80 mg total) by mouth daily.    . clopidogrel (PLAVIX) 75 MG tablet Take 1 tablet (75 mg total) by mouth daily with breakfast. 30 tablet 1  . fexofenadine (ALLEGRA) 180 MG tablet Take 1 tablet (180 mg total) by mouth daily. (Patient taking differently: Take 180 mg by mouth daily as needed for allergies.) 30 tablet 1  . folic acid (FOLVITE) 1 MG tablet Take 1 mg by mouth every evening.     Marland Kitchen glucose blood (BAYER CONTOUR TEST) test strip USE  STRIP TO CHECK GLUCOSE TWICE DAILY. Dx: E11.9 100 each 12  . ipratropium (ATROVENT) 0.06 % nasal spray Place 2 sprays into both nostrils 4 (four) times daily. (Patient taking differently: Place 2 sprays into both nostrils 4 (four) times daily as needed for rhinitis.) 15 mL 12  . losartan (COZAAR) 100 MG tablet Take 1 tablet (100 mg total) by mouth daily. 90 tablet 0  . metFORMIN (GLUCOPHAGE) 500 MG tablet Take 1 tablet by mouth once daily 90 tablet 2  . methotrexate (RHEUMATREX) 2.5 MG tablet Take 2.5 mg by mouth every Wednesday.    . metoprolol succinate (TOPROL-XL) 100 MG 24 hr tablet Take 1 tablet (100  mg total) by mouth daily. Take with or immediately following a meal. 30 tablet 1  . MICROLET LANCETS MISC Check sugar once daily, [employer]  Microlet Lancets. Dx E11.9 100 each 12  . nitroGLYCERIN (NITROSTAT) 0.4 MG SL tablet Place 1 tablet (0.4 mg total) under the tongue every 5 (five) minutes x 3 doses as needed for chest pain. 25 tablet 12  . pantoprazole (PROTONIX) 40 MG tablet Take 1 tablet by mouth once daily 90 tablet 3  . spironolactone (ALDACTONE) 25 MG tablet Take 1 tablet (25 mg total) by mouth daily. 30 tablet 2  . torsemide (DEMADEX) 20 MG tablet Take 20 mg by mouth as needed. Taking 1/2 tablet just as needed     No facility-administered medications prior to visit.    Allergies  Allergen Reactions  . Ethanol Other (See Comments)    REDNESS AND BLISTERS  . Tape Other (See Comments)    Ob band aid adhesive   Pull skin off / Rash  Ob band aid adhesive  Pull skin off / Rash  . Triamterene-Hctz Other (See Comments)    Weakness/fatigue  . Cephalexin Rash    Blotchiness to B lower legs after several days of cephalexin for UTI  . Hydrochlorothiazide W-Triamterene Other (See Comments)    Other reaction(s): Other (See Comments)  Extreme fatigue/weakness  Extreme fatigue/weakness    Other reaction(s): Other (See Comments) Extreme fatigue/weakness    ROS See HPI    Objective:    Physical Exam  Ht 5\' 6"  (1.676 m)   Wt 200 lb 12.8 oz (91.1 kg)   LMP 05/25/1988   BMI 32.41 kg/m  Wt Readings from Last 3 Encounters:  01/14/23 200 lb 12.8 oz (91.1 kg)  12/22/22 200 lb 12.8 oz (91.1 kg)  12/12/22 197 lb 8 oz (89.6 kg)    Visit conducted via telephone.    Assessment & Plan:   Problem List Items Addressed This Visit       Other   Positive self-administered antigen test for COVID-19 - Primary    Positive test 3 days ago, symptoms started 6 days ago. Gradually improving, getting relief of symptoms with Coricidin. Advised to continue. She will also start using her nasal  spray that she has at home to help with her congestion. She can continue Tylenol as needed. Encouraged adequate fluid intake. Counseled on quarantine protocol. Strict return precautions given to patient.        I am having Nina Perez maintain her folic acid, Microlet Lancets, glucose blood, acetaminophen, methotrexate, ipratropium, fexofenadine, torsemide, metFORMIN, pantoprazole, losartan, allopurinol, nitroGLYCERIN, metoprolol succinate, amLODipine, clopidogrel, aspirin EC, atorvastatin, and spironolactone.  No orders of the defined types were placed in this encounter.  I discussed the assessment and treatment plan with the patient. The patient was provided an opportunity to ask questions and all were answered. The patient agreed with the plan and demonstrated an understanding of the instructions.   The patient was advised to call back or seek an in-person evaluation if the symptoms worsen or if the condition fails to improve as anticipated.  I provided 20 minutes of non-face-to-face time during this encounter.   Bethanie Dicker, NP [facility] [employer] [facility] [phone] (phone) [phone] (fax)  [facility]

## 2023-01-14 NOTE — Assessment & Plan Note (Addendum)
Positive test 3 days ago, symptoms started 6 days ago. Gradually improving, getting relief of symptoms with Coricidin. Advised to continue. She will also start using her nasal spray that she has at home to help with her congestion. She can continue Tylenol as needed. Encouraged adequate fluid intake. Counseled on quarantine protocol. Strict return precautions given to patient.

## 2023-01-15 ENCOUNTER — Ambulatory Visit (INDEPENDENT_AMBULATORY_CARE_PROVIDER_SITE_OTHER): Payer: Medicare Other | Admitting: Emergency Medicine

## 2023-01-15 VITALS — Ht 66.0 in | Wt 199.0 lb

## 2023-01-15 DIAGNOSIS — Z0000 Encounter for general adult medical examination without abnormal findings: Secondary | ICD-10-CM | POA: Diagnosis not present

## 2023-01-15 NOTE — Patient Instructions (Addendum)
Ms. Moldenhauer , Thank you for taking time to come for your Medicare Wellness Visit. I appreciate your ongoing commitment to your health goals. Please review the following plan we discussed and let me know if I can assist you in the future.   Referrals/Orders/Follow-Ups/Clinician Recommendations: Get a tetanus shot at your earliest convenience. Get a flu shot this fall. Get a diabetic foot exam at your next OV with Dr. Lorin Picket on 03/19/23  This is a list of the screening recommended for you and due dates:  Health Maintenance  Topic Date Due   DEXA scan (bone density measurement)  Never done   Eye exam for diabetics  10/24/2022   Yearly kidney health urinalysis for diabetes  12/20/2022   COVID-19 Vaccine (6 - 2023-24 season) 01/11/2023   Complete foot exam   12/25/2022   Flu Shot  08/10/2023*   Hemoglobin A1C  05/13/2023   Yearly kidney function blood test for diabetes  01/07/2024   Medicare Annual Wellness Visit  01/15/2024   Pneumonia Vaccine  Completed   HPV Vaccine  Aged Out   DTaP/Tdap/Td vaccine  Discontinued   Mammogram  Discontinued   Zoster (Shingles) Vaccine  Discontinued  *Topic was postponed. The date shown is not the original due date.    Advanced directives: (Copy Requested) Please bring a copy of your health care power of attorney and living will to the office to be added to your chart at your convenience.  Next Medicare Annual Wellness Visit scheduled for next year: Yes, 01/21/24 @ 9am

## 2023-01-15 NOTE — Progress Notes (Signed)
Subjective:   Nina Perez is a 82 y.o. female who presents for Medicare Annual (Subsequent) preventive examination.  Visit Complete: Virtual  I connected with  [name] on 01/15/23 by a audio enabled telemedicine application and verified that I am speaking with the correct person using two identifiers.  Patient Location: Home  Provider Location: Home Office  I discussed the limitations of evaluation and management by telemedicine. The patient expressed understanding and agreed to proceed.  Vital Signs: Because this visit was a virtual/telehealth visit, some criteria may be missing or patient reported. Any vitals not documented were not able to be obtained and vitals that have been documented are patient reported.    Review of Systems     Cardiac Risk Factors include: advanced age (>65men, >55 women);diabetes mellitus;dyslipidemia;hypertension;obesity (BMI >30kg/m2)     Objective:    Today's Vitals   01/15/23 0859  Weight: 199 lb (90.3 kg)  Height: 5\' 6"  (1.676 m)   Body mass index is 32.12 kg/m.     01/15/2023    9:15 AM 11/21/2022    5:41 PM 11/20/2021   10:21 AM 11/13/2021   10:29 AM 11/01/2021    9:42 AM 09/26/2021    9:29 AM 05/21/2021    8:44 AM  Advanced Directives  Does Patient Have a Medical Advance Directive? Yes No Yes Yes Yes Yes Yes  Type of Estate agent of [place];Living will  Healthcare Power of [place];Living will Healthcare Power of [employer] of [place];Living will Living will;Healthcare Power of Attorney  Does patient want to make changes to medical advance directive? No - Patient declined  No - Patient declined No - Patient declined     Copy of Healthcare Power of Attorney in Chart? No - copy requested  No - copy requested No - copy requested  Yes - validated most recent copy scanned in chart (See row information) No - copy requested  Would patient like information on creating a medical advance directive?  No -  Patient declined         Current Medications (verified) Outpatient Encounter Medications as of 01/15/2023  Medication Sig   acetaminophen (TYLENOL) 650 MG CR tablet Take 650-1,300 mg by mouth every 8 (eight) hours as needed for pain.   allopurinol (ZYLOPRIM) 100 MG tablet Take 1 tablet (100 mg total) by mouth daily.   amLODipine (NORVASC) 5 MG tablet Take 1 tablet (5 mg total) by mouth daily.   aspirin EC 81 MG tablet Take 1 tablet (81 mg total) by mouth daily. Swallow whole.   atorvastatin (LIPITOR) 80 MG tablet Take 1 tablet (80 mg total) by mouth daily.   clopidogrel (PLAVIX) 75 MG tablet Take 1 tablet (75 mg total) by mouth daily with breakfast.   fexofenadine (ALLEGRA) 180 MG tablet Take 1 tablet (180 mg total) by mouth daily. (Patient taking differently: Take 180 mg by mouth daily as needed for allergies.)   folic acid (FOLVITE) 1 MG tablet Take 1 mg by mouth every evening.    furosemide (LASIX) 20 MG tablet Take 20 mg by mouth daily.   glucose blood (BAYER CONTOUR TEST) test strip USE  STRIP TO CHECK GLUCOSE TWICE DAILY. Dx: E11.9   ipratropium (ATROVENT) 0.06 % nasal spray Place 2 sprays into both nostrils 4 (four) times daily. (Patient taking differently: Place 2 sprays into both nostrils 4 (four) times daily as needed for rhinitis.)   losartan (COZAAR) 100 MG tablet Take 1 tablet (100 mg total)  by mouth daily.   Magnesium 200 MG TABS Take by mouth. 2 tablets in the morning and 1 tablet at night   metFORMIN (GLUCOPHAGE) 500 MG tablet Take 1 tablet by mouth once daily   methotrexate (RHEUMATREX) 2.5 MG tablet Take 2.5 mg by mouth every Wednesday.   metoprolol succinate (TOPROL-XL) 100 MG 24 hr tablet Take 1 tablet (100 mg total) by mouth daily. Take with or immediately following a meal.   MICROLET LANCETS MISC Check sugar once daily, [employer] Microlet Lancets. Dx E11.9   nitroGLYCERIN (NITROSTAT) 0.4 MG SL tablet Place 1 tablet (0.4 mg total) under the tongue every 5 (five) minutes x 3  doses as needed for chest pain.   pantoprazole (PROTONIX) 40 MG tablet Take 1 tablet by mouth once daily   spironolactone (ALDACTONE) 25 MG tablet Take 1 tablet (25 mg total) by mouth daily.   torsemide (DEMADEX) 20 MG tablet Take 20 mg by mouth as needed. Taking 1/2 tablet just as needed   No facility-administered encounter medications on file as of 01/15/2023.    Allergies (verified) Ethanol, Tape, Triamterene-hctz, Cephalexin, and Hydrochlorothiazide w-triamterene   History: 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HCC)    a.) 2/2 scleroderma Dx   LBBB (left bundle branch block)    Long term current use of antithrombotics/antiplatelets    a.) ASA + clopidogrel   Long term current use of immunosuppressive drug    a.) MTX for RA and scleroderma Dx   OSA on CPAP    Osteoarthritis    a.) knees, spine   PAD (peripheral artery disease) (HCC)    Phlebitis    a.) x 2 (with pregnancy)   Pulmonary fibrosis (HCC)    a.) mild   Pulmonary hypertension (HCC) 02/24/2012   a.) TTE 02/24/2012: EF 55%, RVSP 33; b.) TTE 10/12/2014: EF >55%, RVSP 36.1; c.) TTE 09/02/2017: EF 55%, RVSP 45.5   Reflux esophagitis    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 b.) renal US 05/23/2020: solid LEFT renal mass measuring 1.4 x 1.5 x 1.6. 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, sclerodactyly, telangiectasias   Skin cancer 2018   Supplemental oxygen dependent    a.) 2L/[place] PRN for DOE related to mild pulmonary fibrosis and pulmonary HTN   T2DM (type 2 diabetes mellitus) (HCC)    Past Surgical History:  Procedure Laterality Date   ABDOMINAL AORTOGRAM W/LOWER EXTREMITY Left 05/16/2019   Procedure: ABDOMINAL AORTOGRAM W/LOWER EXTREMITY;  Surgeon: Runell Gess, MD;  [facility];  Service: Cardiovascular;  Laterality: Left;   AMPUTATION TOE Left 06/02/2019   Procedure: AMPUTATION TOE;  Surgeon: Recardo Evangelist, DPM;  Location: [facility];  Service: Podiatry;  Laterality: Left;  APPENDECTOMY  1972   BREAST CYST ASPIRATION Left    neg   COLONOSCOPY WITH PROPOFOL N/A 09/26/2021   Procedure: COLONOSCOPY WITH PROPOFOL;  Surgeon: Jaynie Collins, DO;  Location: [facility] ENDOSCOPY;  Service: Gastroenterology;  Laterality: N/A;  DM, WHEELCHAIR, PLAVIX   CORONARY STENT INTERVENTION N/A 11/24/2022   Procedure: CORONARY STENT INTERVENTION;  Surgeon: Iran Ouch, MD;  Location: [facility];  Service: Cardiovascular;  Laterality: N/A;   ESOPHAGOGASTRODUODENOSCOPY (EGD) WITH PROPOFOL N/A 09/26/2021   Procedure: ESOPHAGOGASTRODUODENOSCOPY (EGD) WITH PROPOFOL;  Surgeon: Jaynie Collins, DO;  Location: [name];  Service: Gastroenterology;  Laterality: N/A;   INCISION AND DRAINAGE OF WOUND Left 06/07/2019   Procedure: IRRIGATION AND DEBRIDEMENT WOUND;  Surgeon: Recardo Evangelist, DPM;  Location: [facility];  Service: Podiatry;  Laterality: Left;   KNEE ARTHROPLASTY Right 11/13/2021   Procedure: COMPUTER ASSISTED TOTAL KNEE ARTHROPLASTY;  Surgeon: Donato Heinz, MD;  Location: [facility] OR[facility];  Service: Orthopedics;  Laterality: Right;   LEFT HEART CATH AND CORONARY ANGIOGRAPHY Right 11/24/2022   Procedure: LEFT HEART CATH AND CORONARY ANGIOGRAPHY with possible coronary intervention;  Surgeon: Laurier Nancy, MD;   Location: [facility];  Service: Cardiovascular;  Laterality: Right;   LOWER EXTREMITY ANGIOGRAPHY Left 06/03/2019   Procedure: Lower Extremity Angiography;  Surgeon: Renford Dills, MD;  Location: [facility];  Service: Cardiovascular;  Laterality: Left;   LOWER EXTREMITY ANGIOGRAPHY Right 05/21/2021   Procedure: LOWER EXTREMITY ANGIOGRAPHY;  Surgeon: Renford Dills, MD;  Location: [facility];  Service: Cardiovascular;  Laterality: Right;   PERIPHERAL VASCULAR BALLOON ANGIOPLASTY Left 05/16/2019   Procedure: PERIPHERAL VASCULAR BALLOON ANGIOPLASTY;  Surgeon: Runell Gess, MD;  [facility]: [facility];  Service: Cardiovascular;  Laterality: Left;  SFA   TUBAL LIGATION  1972   VEIN LIGATION AND STRIPPING     Family History  Problem Relation Age of Onset   Arthritis Mother    Heart attack Father    Cancer Sister    Cancer - Prostate Brother    Colon cancer Neg Hx    Breast cancer Neg Hx    Social History   Socioeconomic History   Marital status: Married    Spouse name: [name]   Number of children: 2   Years of education: Not on file   Highest education level: Not on file  Occupational History   Occupation: retired    Comment: Market researcher  Tobacco Use   Smoking status: Former    Current packs/day: 0.00    Average packs/day: 0.5 packs/day for 15.0 years (7.5 ttl pk-yrs)    Types: Cigarettes    Start date: 05/12/1973    Quit date: 05/12/1988    Years since quitting: 34.7   Smokeless tobacco: Never  Vaping Use   Vaping status: Never Used  Substance and Sexual Activity   Alcohol use: No    Alcohol/week: 0.0 standard drinks of alcohol   Drug use: No   Sexual activity: Yes  Other Topics Concern   Not on file  Social History Narrative   Lives at home with spouse [name]    Social Determinants of Health   Financial Resource Strain: Low Risk  (01/15/2023)   Overall Financial Resource Strain (CARDIA)    Difficulty of Paying  Living Expenses: Not hard at all  Food Insecurity: No Food Insecurity (01/15/2023)   Hunger Vital Sign    Worried About Running Out of Food in the Last Year:  Never true    Ran Out of Food in the Last Year: Never true  Transportation Needs: No Transportation Needs (01/15/2023)   PRAPARE - Administrator, Civil Service (Medical): No    Lack of Transportation (Non-Medical): No  Physical Activity: Unknown (01/15/2023)   Exercise Vital Sign    Days of Exercise per Week: 7 days    Minutes of Exercise per Session: Not on file  Stress: No Stress Concern Present (01/15/2023)   [employer] of Occupational Health - Occupational Stress Questionnaire    Feeling of Stress : Not at all  Social Connections: Moderately Integrated (01/15/2023)   Social Connection and Isolation Panel [NHANES]    Frequency of Communication with Friends and Family: More than three times a week    Frequency of Social Gatherings with Friends and Family: Twice a week    Attends Religious Services: More than 4 times per year    Active Member of [employer] or Organizations: No    Attends Engineer, structural: Never    Marital Status: Married    Tobacco Counseling Counseling given: Not Answered   Clinical Intake:  Pre-visit preparation completed: Yes  Pain : No/denies pain     BMI - recorded: 32.12 Nutritional Status: BMI > 30  Obese Nutritional Risks: None Diabetes: Yes CBG done?: No Did pt. bring in CBG monitor from home?: No  How often do you need to have someone help you when you read instructions, pamphlets, or other written materials from your doctor or pharmacy?: 1 - Never  Interpreter Needed?: No  Information entered by :: Tora Kindred, CMA   Activities of Daily Living    01/15/2023    9:03 AM 11/22/2022    5:30 PM  In your present state of health, do you have any difficulty performing the following activities:  Hearing? 0 0  Vision? 0 0  Difficulty concentrating or making decisions?  0 0  Walking or climbing stairs? 1 1  Comment uses walker   Dressing or bathing? 0 0  Doing errands, shopping? 1 1  Comment patient doesn't drive, husband Biomedical scientist and eating ? N   Using the Toilet? N   In the past six months, have you accidently leaked urine? Y   Comment wears a pad   Do you have problems with loss of bowel control? N   Managing your Medications? N   Managing your Finances? N   Housekeeping or managing your Housekeeping? N     Patient Care Team: Dale Garrard, MD as PCP - General (Internal Medicine) Runell Gess, MD as PCP - Cardiology (Cardiology)  Indicate any recent Medical Services you may have received from other than [facility] providers in the past year (date may be approximate).     Assessment:   This is a routine wellness examination for [place].  Hearing/Vision screen Hearing Screening - Comments:: Denies hearing loss Vision Screening - Comments:: Gets routine eye exams   Goals Addressed               This Visit's Progress     DIET - EAT MORE FRUITS AND VEGETABLES (pt-stated)        Depression Screen    01/15/2023    9:13 AM 07/14/2022   10:52 AM 04/14/2022   11:32 AM 12/24/2021    3:11 PM 11/20/2021   10:20 AM 04/18/2021   11:30 AM 11/19/2020   12:37 PM  PHQ 2/9 Scores  PHQ - 2 Score  0 0 0 0 0 0 0  PHQ- 9 Score 0          Fall Risk    01/15/2023    9:16 AM 07/14/2022   10:51 AM 04/14/2022   11:32 AM 12/24/2021    3:11 PM 11/20/2021   10:21 AM  Fall Risk   Falls in the past year? 0 0 0 0   Number falls in past yr: 0 0  0   Injury with Fall? 0 0  0   Risk for fall due to : Impaired mobility No Fall Risks No Fall Risks No Fall Risks Impaired balance/gait  Risk for fall due to: Comment     Walker in use  Follow up Falls prevention discussed;Falls evaluation completed Falls evaluation completed Falls evaluation completed Falls evaluation completed Falls evaluation completed    MEDICARE RISK AT HOME: Medicare Risk at  Home Any stairs in or around the home?: Yes If so, are there any without handrails?: No Home free of loose throw rugs in walkways, pet beds, electrical cords, etc?: Yes Adequate lighting in your home to reduce risk of falls?: Yes Life alert?: No Use of a cane, walker or w/c?: Yes (walker) Grab bars in the bathroom?: Yes Shower chair or bench in shower?: Yes Elevated toilet seat or a handicapped toilet?: Yes  TIMED UP AND GO:  Was the test performed?  No    Cognitive Function:    11/06/2017   10:26 AM 03/20/2016    4:52 PM  MMSE - Mini Mental State Exam  Not completed:  Unable to complete  Orientation to time 5   Orientation to Place 5   Registration 3   Attention/ Calculation 5   Recall 2   Language- name 2 objects 2   Language- repeat 1   Language- follow 3 step command 3   Language- read & follow direction 1   Write a sentence 1   Copy design 1   Total score 29         01/15/2023    9:18 AM 11/20/2021   10:25 AM 11/17/2019   12:55 PM 11/09/2018    8:42 AM  6CIT Screen  What Year? 0 points 0 points 0 points 0 points  What month? 0 points 0 points 0 points 0 points  What time? 0 points 0 points  0 points  Count back from 20 0 points   0 points  Months in reverse 0 points 0 points 0 points 0 points  Repeat phrase 0 points  0 points   Total Score 0 points       Immunizations Immunization History  Administered Date(s) Administered   Fluad Quad(high Dose 65+) 02/07/2019, 01/15/2021, 02/13/2022   Influenza Split 02/09/2013   Influenza Whole 02/18/2017   Influenza, High Dose Seasonal PF 04/14/2016, 02/23/2018, 02/29/2020   Influenza,inj,Quad PF,6+ Mos 02/28/2014, 02/01/2015   Influenza-Unspecified 02/20/2012   PFIZER Comirnaty(Gray Top)Covid-19 Tri-Sucrose Vaccine 07/07/2019, 07/28/2019   PFIZER(Purple Top)SARS-COV-2 Vaccination 07/07/2019, 07/28/2019, 02/13/2020   Pneumococcal Conjugate-13 09/06/2013   Pneumococcal Polysaccharide-23 06/12/2017    TDAP status:  Due, Education has been provided regarding the importance of this vaccine. Advised may receive this vaccine at local pharmacy or Health Dept. Aware to provide a copy of the vaccination record if obtained from local pharmacy or Health Dept. Verbalized acceptance and understanding.  Flu Vaccine status: Due, Education has been provided regarding the importance of this vaccine. Advised may receive this vaccine at local pharmacy or Health Dept. Aware to  provide a copy of the vaccination record if obtained from local pharmacy or Health Dept. Verbalized acceptance and understanding.  Pneumococcal vaccine status: Up to date  Covid-19 vaccine status: Declined, Education has been provided regarding the importance of this vaccine but patient still declined. Advised may receive this vaccine at local pharmacy or Health Dept.or vaccine clinic. Aware to provide a copy of the vaccination record if obtained from local pharmacy or Health Dept. Verbalized acceptance and understanding.  Qualifies for Shingles Vaccine? Yes   Zostavax completed No   Shingrix Completed?: No.    Education has been provided regarding the importance of this vaccine. Patient has been advised to call insurance company to determine out of pocket expense if they have not yet received this vaccine. Advised may also receive vaccine at local pharmacy or Health Dept. Verbalized acceptance and understanding.  Screening Tests Health Maintenance  Topic Date Due   DEXA SCAN  Never done   OPHTHALMOLOGY EXAM  10/24/2022   Diabetic kidney evaluation - Urine ACR  12/20/2022   COVID-19 Vaccine (6 - 2023-24 season) 01/11/2023   FOOT EXAM  12/25/2022   INFLUENZA VACCINE  08/10/2023 (Originally 12/11/2022)   HEMOGLOBIN A1C  05/13/2023   Diabetic kidney evaluation - eGFR measurement  01/07/2024   Medicare Annual Wellness (AWV)  01/15/2024   Pneumonia Vaccine 63+ Years old  Completed   HPV VACCINES  Aged Out   DTaP/Tdap/Td  Discontinued   MAMMOGRAM   Discontinued   Zoster Vaccines- Shingrix  Discontinued    Health Maintenance  Health Maintenance Due  Topic Date Due   DEXA SCAN  Never done   OPHTHALMOLOGY EXAM  10/24/2022   Diabetic kidney evaluation - Urine ACR  12/20/2022   COVID-19 Vaccine (6 - 2023-24 season) 01/11/2023   FOOT EXAM  12/25/2022    Colorectal cancer screening: No longer required.   Mammogram status: No longer required due to age.  Bone Density: patient declined  Lung Cancer Screening: (Low Dose CT Chest recommended if Age 45-80 years, 20 pack-year currently smoking OR have quit w/in 15years.) does not qualify.   Lung Cancer Screening Referral: n/a  Additional Screening:  Hepatitis C Screening: does not qualify; Completed n/a  Vision Screening: Recommended annual ophthalmology exams for early detection of glaucoma and other disorders of the eye. Is the patient up to date with their annual eye exam?  Yes , appointment scheduled 03/02/23 Who is the provider or what is the name of the office in which the patient attends annual eye exams? Dr. Tally Joe, [place], [place] If pt is not established with a provider, would they like to be referred to a provider to establish care? No .   Dental Screening: Recommended annual dental exams for proper oral hygiene  Diabetic Foot Exam: Diabetic Foot Exam: Completed 12/24/21  due for DM foot exam at next OV 03/19/23  Community Resource Referral / Chronic Care Management: CRR required this visit?  No   CCM required this visit?  No     Plan:     I have personally reviewed and noted the following in the patient's chart:   Medical and social history Use of alcohol, tobacco or illicit drugs  Current medications and supplements including opioid prescriptions. Patient is not currently taking opioid prescriptions. Functional ability and status Nutritional status Physical activity Advanced directives List of other physicians Hospitalizations, surgeries, and ER visits  in previous 12 months Vitals Screenings to include cognitive, depression, and falls Referrals and appointments  In addition,  I have reviewed and discussed with patient certain preventive protocols, quality metrics, and best practice recommendations. A written personalized care plan for preventive services as well as general preventive health recommendations were provided to patient.     Tora Kindred, CMA   01/15/2023   After Visit Summary: (MyChart) Due to this being a telephonic visit, the after visit summary with patients personalized plan was offered to patient via MyChart   Nurse Notes:  Declined referral for DM & Nutrition education. Declined shingles and covid vaccines Declined DEXA scan Needs Tdap Needs DM foot exam at next OV on 03/19/23

## 2023-01-20 ENCOUNTER — Telehealth: Payer: Self-pay | Admitting: Internal Medicine

## 2023-01-20 NOTE — Telephone Encounter (Signed)
Prescription Request  01/20/2023  LOV: 12/22/2022  What is the name of the medication or equipment? metoprolol succinate (TOPROL-XL) 100 MG 24 hr tablet,  amLODipine (NORVASC) 5 MG tablet, and clopidogrel (PLAVIX) 75 MG tablet, all these medications were prescribed at the hospital.    Have you contacted your pharmacy to request a refill? No   Which pharmacy would you like this sent to?   [facility] [address], [place] - [address] [facility] [address] [place] [address] Phone: [phone] Fax: [phone]  Phone: [phone] Fax: [phone]    Patient notified that their request is being sent to the clinical staff for review and that they should receive a response within 2 business days.   Please advise at [facility] [phone]

## 2023-01-20 NOTE — Telephone Encounter (Signed)
She established with Dr Sandie Ano 12/12/22. Are you ok with refilling her amlodipine, plavix, and metoprolol?

## 2023-01-20 NOTE — Telephone Encounter (Signed)
If she has been taking regularly ok to refill

## 2023-01-21 ENCOUNTER — Other Ambulatory Visit: Payer: Self-pay

## 2023-01-21 MED ORDER — METOPROLOL SUCCINATE ER 100 MG PO TB24: 100.0000 mg | ORAL_TABLET | Freq: Every day | ORAL | 1 refills | Status: DC

## 2023-01-21 MED ORDER — CLOPIDOGREL BISULFATE 75 MG PO TABS: 75.0000 mg | ORAL_TABLET | Freq: Every day | ORAL | 1 refills | Status: DC

## 2023-01-21 MED ORDER — AMLODIPINE BESYLATE 5 MG PO TABS: 5.0000 mg | ORAL_TABLET | Freq: Every day | ORAL | 1 refills | Status: DC

## 2023-01-21 NOTE — Telephone Encounter (Signed)
Medication refilled and patient is aware.

## 2023-01-23 DIAGNOSIS — M069 Rheumatoid arthritis, unspecified: Secondary | ICD-10-CM | POA: Diagnosis not present

## 2023-01-23 DIAGNOSIS — E1122 Type 2 diabetes mellitus with diabetic chronic kidney disease: Secondary | ICD-10-CM

## 2023-01-23 DIAGNOSIS — E1151 Type 2 diabetes mellitus with diabetic peripheral angiopathy without gangrene: Secondary | ICD-10-CM | POA: Diagnosis not present

## 2023-01-23 DIAGNOSIS — I25119 Atherosclerotic heart disease of native coronary artery with unspecified angina pectoris: Secondary | ICD-10-CM

## 2023-01-23 DIAGNOSIS — I214 Non-ST elevation (NSTEMI) myocardial infarction: Secondary | ICD-10-CM | POA: Diagnosis not present

## 2023-01-23 DIAGNOSIS — N183 Chronic kidney disease, stage 3 unspecified: Secondary | ICD-10-CM

## 2023-01-23 DIAGNOSIS — D696 Thrombocytopenia, unspecified: Secondary | ICD-10-CM

## 2023-01-23 DIAGNOSIS — K219 Gastro-esophageal reflux disease without esophagitis: Secondary | ICD-10-CM

## 2023-01-23 DIAGNOSIS — Z48812 Encounter for surgical aftercare following surgery on the circulatory system: Secondary | ICD-10-CM | POA: Diagnosis not present

## 2023-01-23 DIAGNOSIS — I129 Hypertensive chronic kidney disease with stage 1 through stage 4 chronic kidney disease, or unspecified chronic kidney disease: Secondary | ICD-10-CM

## 2023-01-23 DIAGNOSIS — K449 Diaphragmatic hernia without obstruction or gangrene: Secondary | ICD-10-CM

## 2023-01-23 DIAGNOSIS — M109 Gout, unspecified: Secondary | ICD-10-CM

## 2023-01-29 ENCOUNTER — Other Ambulatory Visit (INDEPENDENT_AMBULATORY_CARE_PROVIDER_SITE_OTHER): Payer: Medicare Other

## 2023-01-29 DIAGNOSIS — R944 Abnormal results of kidney function studies: Secondary | ICD-10-CM | POA: Diagnosis not present

## 2023-01-29 DIAGNOSIS — Z23 Encounter for immunization: Secondary | ICD-10-CM | POA: Diagnosis not present

## 2023-01-29 LAB — BASIC METABOLIC PANEL
BUN: 22 mg/dL (ref 6–23)
CO2: 28 mEq/L (ref 19–32)
Calcium: 9.8 mg/dL (ref 8.4–10.5)
Chloride: 107 mEq/L (ref 96–112)
Creatinine, Ser: 1.06 mg/dL (ref 0.40–1.20)
GFR: 49.2 mL/min — ABNORMAL LOW (ref >60.00)
Glucose, Bld: 76 mg/dL (ref 70–99)
Potassium: 4.1 mEq/L (ref 3.5–5.1)
Sodium: 142 mEq/L (ref 135–145)

## 2023-01-30 ENCOUNTER — Ambulatory Visit: Payer: Medicare Other | Admitting: Urology

## 2023-02-03 ENCOUNTER — Other Ambulatory Visit: Payer: Self-pay | Admitting: Internal Medicine

## 2023-02-03 ENCOUNTER — Other Ambulatory Visit: Payer: Self-pay

## 2023-02-03 DIAGNOSIS — R944 Abnormal results of kidney function studies: Secondary | ICD-10-CM

## 2023-02-03 DIAGNOSIS — R93429 Abnormal radiologic findings on diagnostic imaging of unspecified kidney: Secondary | ICD-10-CM

## 2023-02-03 DIAGNOSIS — N289 Disorder of kidney and ureter, unspecified: Secondary | ICD-10-CM

## 2023-02-03 DIAGNOSIS — D1771 Benign lipomatous neoplasm of kidney: Secondary | ICD-10-CM

## 2023-02-03 NOTE — Progress Notes (Signed)
Order placed for f/u renal ultrasound.

## 2023-02-04 ENCOUNTER — Ambulatory Visit: Payer: Medicare Other | Admitting: Family Medicine

## 2023-02-04 ENCOUNTER — Encounter: Payer: Self-pay | Admitting: Family Medicine

## 2023-02-04 ENCOUNTER — Telehealth: Payer: Self-pay | Admitting: Internal Medicine

## 2023-02-04 VITALS — BP 116/78 | HR 64 | Temp 97.9°F | Ht 66.0 in | Wt 201.2 lb

## 2023-02-04 DIAGNOSIS — R300 Dysuria: Secondary | ICD-10-CM | POA: Diagnosis not present

## 2023-02-04 LAB — URINALYSIS, MICROSCOPIC ONLY

## 2023-02-04 LAB — POC URINALSYSI DIPSTICK (AUTOMATED)
Bilirubin, UA: NEGATIVE
Glucose, UA: NEGATIVE
Nitrite, UA: POSITIVE
Protein, UA: POSITIVE — AB
Spec Grav, UA: 1.015 (ref 1.010–1.025)
Urobilinogen, UA: 0.2 E.U./dL
pH, UA: 5.5 (ref 5.0–8.0)

## 2023-02-04 MED ORDER — NITROFURANTOIN MONOHYD MACRO 100 MG PO CAPS
100.0000 mg | ORAL_CAPSULE | Freq: Two times a day (BID) | ORAL | 0 refills | Status: DC
Start: 2023-02-04 — End: 2023-03-19

## 2023-02-04 NOTE — Assessment & Plan Note (Addendum)
Symptoms are concerning for UTI.  Will treat with Macrobid.  Urinalysis is consistent with UTI.  Send for urine culture and microscopy.  If worsening symptoms or not improving she will let us know right away.

## 2023-02-04 NOTE — Progress Notes (Signed)
Marikay Alar, MD Phone: [phone]  Nina Perez is a 82 y.o. female who presents today for same-day visit.  Dysuria: Patient notes onset of symptoms on 01/31/2023.  Has had dysuria, urinary frequency, urinary urgency.  Also has some itching.  No hematuria.  No vaginal discharge.  No abdominal pain.  Notes she does not typically have UTIs.  Social History   Tobacco Use  Smoking Status Former   Current packs/day: 0.00   Average packs/day: 0.5 packs/day for 15.0 years (7.5 ttl pk-yrs)   Types: Cigarettes   Start date: 05/12/1973   Quit date: 05/12/1988   Years since quitting: 34.7  Smokeless Tobacco Never    Current Outpatient Medications on File Prior to Visit  Medication Sig Dispense Refill   acetaminophen (TYLENOL) 650 MG CR tablet Take 650-1,300 mg by mouth every 8 (eight) hours as needed for pain.     allopurinol (ZYLOPRIM) 100 MG tablet Take 1 tablet (100 mg total) by mouth daily. 90 tablet 1   amLODipine (NORVASC) 5 MG tablet Take 1 tablet (5 mg total) by mouth daily. 90 tablet 1   aspirin EC 81 MG tablet Take 1 tablet (81 mg total) by mouth daily. Swallow whole. 30 tablet 12   atorvastatin (LIPITOR) 80 MG tablet Take 1 tablet (80 mg total) by mouth daily.     clopidogrel (PLAVIX) 75 MG tablet Take 1 tablet (75 mg total) by mouth daily with breakfast. 90 tablet 1   fexofenadine (ALLEGRA) 180 MG tablet Take 1 tablet (180 mg total) by mouth daily. (Patient taking differently: Take 180 mg by mouth daily as needed for allergies.) 30 tablet 1   folic acid (FOLVITE) 1 MG tablet Take 1 mg by mouth every evening.      furosemide (LASIX) 20 MG tablet Take 20 mg by mouth daily.     glucose blood (BAYER CONTOUR TEST) test strip USE  STRIP TO CHECK GLUCOSE TWICE DAILY. Dx: E11.9 100 each 12   ipratropium (ATROVENT) 0.06 % nasal spray Place 2 sprays into both nostrils 4 (four) times daily. (Patient taking differently: Place 2 sprays into both nostrils 4 (four) times daily as needed for  rhinitis.) 15 mL 12   losartan (COZAAR) 100 MG tablet Take 1 tablet (100 mg total) by mouth daily. 90 tablet 0   Magnesium 200 MG TABS Take by mouth. 2 tablets in the morning and 1 tablet at night     metFORMIN (GLUCOPHAGE) 500 MG tablet Take 1 tablet by mouth once daily 90 tablet 2   methotrexate (RHEUMATREX) 2.5 MG tablet Take 2.5 mg by mouth every Wednesday.     metoprolol succinate (TOPROL-XL) 100 MG 24 hr tablet Take 1 tablet (100 mg total) by mouth daily. Take with or immediately following a meal. 90 tablet 1   MICROLET LANCETS MISC Check sugar once daily, [employer] Microlet Lancets. Dx E11.9 100 each 12   nitroGLYCERIN (NITROSTAT) 0.4 MG SL tablet Place 1 tablet (0.4 mg total) under the tongue every 5 (five) minutes x 3 doses as needed for chest pain. 25 tablet 12   pantoprazole (PROTONIX) 40 MG tablet Take 1 tablet by mouth once daily 90 tablet 3   spironolactone (ALDACTONE) 25 MG tablet Take 1 tablet (25 mg total) by mouth daily. 30 tablet 2   torsemide (DEMADEX) 20 MG tablet Take 20 mg by mouth as needed. Taking 1/2 tablet just as needed     No current facility-administered medications on file prior to visit.  ROS see history of present illness  Objective  Physical Exam Vitals:   02/04/23 1304  BP: 116/78  Pulse: 64  Temp: 97.9 F (36.6 C)  SpO2: 98%    BP Readings from Last 3 Encounters:  02/04/23 116/78  12/22/22 130/80  12/12/22 131/77   Wt Readings from Last 3 Encounters:  02/04/23 201 lb 3.2 oz (91.3 kg)  01/15/23 199 lb (90.3 kg)  01/14/23 200 lb 12.8 oz (91.1 kg)    Physical Exam Abdominal:     General: Bowel sounds are normal. There is no distension.     Palpations: Abdomen is soft.     Tenderness: There is no abdominal tenderness.      Assessment/Plan: Please see individual problem list.  Dysuria Assessment & Plan: Symptoms are concerning for UTI.  Will treat with Macrobid.  Urinalysis is consistent with UTI.  Send for urine culture and  microscopy.  If worsening symptoms or not improving she will let us know right away.  Orders: -     POCT Urinalysis Dipstick (Automated) -     Urine Microscopic -     Urine Culture -     Nitrofurantoin Monohyd Macro; Take 1 capsule (100 mg total) by mouth 2 (two) times daily.  Dispense: 14 capsule; Refill: 0    Return if symptoms worsen or fail to improve.   Marikay Alar, MD [facility]

## 2023-02-04 NOTE — Patient Instructions (Signed)
Nice to see you. It appears you have a UTI. Will treat you with Macrobid 1 tablet twice daily for 7 days.  Will send your urine for culture microscopy and we will contact you with those results. If you have any worsening symptoms please let us know right away.

## 2023-02-04 NOTE — Telephone Encounter (Signed)
Lft pt vm to call ofc to sch [place]. thanks ?

## 2023-02-05 ENCOUNTER — Encounter: Payer: Self-pay | Admitting: *Deleted

## 2023-02-06 LAB — URINE CULTURE
MICRO NUMBER:: 15514746
SPECIMEN QUALITY:: ADEQUATE

## 2023-02-11 ENCOUNTER — Ambulatory Visit
Admission: RE | Admit: 2023-02-11 | Discharge: 2023-02-11 | Disposition: A | Discharge status: Home / Self Care | Payer: Medicare Other | Source: Ambulatory Visit | Attending: Internal Medicine | Admitting: Internal Medicine

## 2023-02-11 DIAGNOSIS — R93429 Abnormal radiologic findings on diagnostic imaging of unspecified kidney: Secondary | ICD-10-CM | POA: Diagnosis present

## 2023-03-02 ENCOUNTER — Encounter: Payer: Self-pay | Admitting: Internal Medicine

## 2023-03-02 ENCOUNTER — Other Ambulatory Visit: Payer: Self-pay | Admitting: Internal Medicine

## 2023-03-02 ENCOUNTER — Ambulatory Visit: Payer: Medicare Other | Admitting: Internal Medicine

## 2023-03-02 VITALS — BP 120/70 | HR 68 | Temp 98.2°F | Resp 16 | Ht 66.0 in | Wt 204.6 lb

## 2023-03-02 DIAGNOSIS — M25552 Pain in left hip: Secondary | ICD-10-CM | POA: Insufficient documentation

## 2023-03-02 DIAGNOSIS — I10 Essential (primary) hypertension: Secondary | ICD-10-CM

## 2023-03-02 DIAGNOSIS — D696 Thrombocytopenia, unspecified: Secondary | ICD-10-CM | POA: Diagnosis not present

## 2023-03-02 DIAGNOSIS — R0602 Shortness of breath: Secondary | ICD-10-CM | POA: Diagnosis not present

## 2023-03-02 DIAGNOSIS — E7800 Pure hypercholesterolemia, unspecified: Secondary | ICD-10-CM

## 2023-03-02 DIAGNOSIS — M341 CR(E)ST syndrome: Secondary | ICD-10-CM

## 2023-03-02 DIAGNOSIS — K2100 Gastro-esophageal reflux disease with esophagitis, without bleeding: Secondary | ICD-10-CM

## 2023-03-02 DIAGNOSIS — C8475 Anaplastic large cell lymphoma, ALK-negative, lymph nodes of inguinal region and lower limb: Secondary | ICD-10-CM | POA: Insufficient documentation

## 2023-03-02 DIAGNOSIS — D1771 Benign lipomatous neoplasm of kidney: Secondary | ICD-10-CM

## 2023-03-02 DIAGNOSIS — R944 Abnormal results of kidney function studies: Secondary | ICD-10-CM

## 2023-03-02 DIAGNOSIS — I700 Atherosclerosis of aorta: Secondary | ICD-10-CM

## 2023-03-02 DIAGNOSIS — E119 Type 2 diabetes mellitus without complications: Secondary | ICD-10-CM | POA: Diagnosis not present

## 2023-03-02 DIAGNOSIS — Z860100 Personal history of colon polyps, unspecified: Secondary | ICD-10-CM

## 2023-03-02 DIAGNOSIS — L03115 Cellulitis of right lower limb: Secondary | ICD-10-CM

## 2023-03-02 DIAGNOSIS — M0579 Rheumatoid arthritis with rheumatoid factor of multiple sites without organ or systems involvement: Secondary | ICD-10-CM

## 2023-03-02 DIAGNOSIS — I2510 Atherosclerotic heart disease of native coronary artery without angina pectoris: Secondary | ICD-10-CM

## 2023-03-02 DIAGNOSIS — M7989 Other specified soft tissue disorders: Secondary | ICD-10-CM

## 2023-03-02 DIAGNOSIS — E1159 Type 2 diabetes mellitus with other circulatory complications: Secondary | ICD-10-CM

## 2023-03-02 DIAGNOSIS — I272 Pulmonary hypertension, unspecified: Secondary | ICD-10-CM

## 2023-03-02 DIAGNOSIS — N1831 Chronic kidney disease, stage 3a: Secondary | ICD-10-CM

## 2023-03-02 DIAGNOSIS — Z89422 Acquired absence of other left toe(s): Secondary | ICD-10-CM

## 2023-03-02 DIAGNOSIS — L03119 Cellulitis of unspecified part of limb: Secondary | ICD-10-CM | POA: Insufficient documentation

## 2023-03-02 DIAGNOSIS — I872 Venous insufficiency (chronic) (peripheral): Secondary | ICD-10-CM

## 2023-03-02 DIAGNOSIS — I429 Cardiomyopathy, unspecified: Secondary | ICD-10-CM

## 2023-03-02 LAB — HEMOGLOBIN A1C: Hgb A1c MFr Bld: 6.3 % (ref 4.6–6.5)

## 2023-03-02 LAB — HEPATIC FUNCTION PANEL
ALT: 20 U/L (ref 0–35)
AST: 18 U/L (ref 0–37)
Albumin: 3.6 g/dL (ref 3.5–5.2)
Alkaline Phosphatase: 79 U/L (ref 39–117)
Bilirubin, Direct: 0.2 mg/dL (ref 0.0–0.3)
Total Bilirubin: 0.9 mg/dL (ref 0.2–1.2)
Total Protein: 6.2 g/dL (ref 6.0–8.3)

## 2023-03-02 LAB — LIPID PANEL
Cholesterol: 95 mg/dL (ref 0–200)
HDL: 41.2 mg/dL (ref >39.00)
LDL Cholesterol: 29 mg/dL (ref 0–99)
NonHDL: 53.65
Total CHOL/HDL Ratio: 2
Triglycerides: 125 mg/dL (ref 0.0–149.0)
VLDL: 25 mg/dL (ref 0.0–40.0)

## 2023-03-02 LAB — CBC WITH DIFFERENTIAL/PLATELET
Basophils Absolute: 0 10*3/uL (ref 0.0–0.1)
Basophils Relative: 0.3 % (ref 0.0–3.0)
Eosinophils Absolute: 0.1 10*3/uL (ref 0.0–0.7)
Eosinophils Relative: 1.1 % (ref 0.0–5.0)
HCT: 40.3 % (ref 36.0–46.0)
Hemoglobin: 12.7 g/dL (ref 12.0–15.0)
Lymphocytes Relative: 12.1 % (ref 12.0–46.0)
Lymphs Abs: 1.1 10*3/uL (ref 0.7–4.0)
MCHC: 31.5 g/dL (ref 30.0–36.0)
MCV: 104.6 fL — ABNORMAL HIGH (ref 78.0–100.0)
Monocytes Absolute: 0.5 10*3/uL (ref 0.1–1.0)
Monocytes Relative: 5.3 % (ref 3.0–12.0)
Neutro Abs: 7.6 10*3/uL (ref 1.4–7.7)
Neutrophils Relative %: 81.2 % — ABNORMAL HIGH (ref 43.0–77.0)
Platelets: 166 10*3/uL (ref 150.0–400.0)
RBC: 3.85 Mil/uL — ABNORMAL LOW (ref 3.87–5.11)
RDW: 21.5 % — ABNORMAL HIGH (ref 11.5–15.5)
WBC: 9.4 10*3/uL (ref 4.0–10.5)

## 2023-03-02 LAB — BASIC METABOLIC PANEL
BUN: 27 mg/dL — ABNORMAL HIGH (ref 6–23)
CO2: 27 meq/L (ref 19–32)
Calcium: 9.6 mg/dL (ref 8.4–10.5)
Chloride: 104 meq/L (ref 96–112)
Creatinine, Ser: 0.99 mg/dL (ref 0.40–1.20)
GFR: 53.38 mL/min — ABNORMAL LOW (ref >60.00)
Glucose, Bld: 79 mg/dL (ref 70–99)
Potassium: 4 meq/L (ref 3.5–5.1)
Sodium: 139 meq/L (ref 135–145)

## 2023-03-02 LAB — MAGNESIUM: Magnesium: 1.2 mg/dL — ABNORMAL LOW (ref 1.5–2.5)

## 2023-03-02 LAB — TSH: TSH: 4.01 u[IU]/mL (ref 0.35–5.50)

## 2023-03-02 MED ORDER — DOXYCYCLINE HYCLATE 100 MG PO TABS: 100.0000 mg | ORAL_TABLET | Freq: Two times a day (BID) | ORAL | 0 refills | Status: DC

## 2023-03-02 NOTE — Assessment & Plan Note (Signed)
Echocardiogram 11/2022 - EF 50-55% with grade III DD, right and left atrium moderately dilated with mild MR and mild AR. Continue spironolactone, metoprolol, losartan.

## 2023-03-02 NOTE — Assessment & Plan Note (Signed)
Continue lipitor  ?

## 2023-03-02 NOTE — Assessment & Plan Note (Signed)
On MTX.  Followed by rheumatology.

## 2023-03-02 NOTE — Progress Notes (Signed)
Subjective:    Patient ID: Nina Perez, female    DOB: Mar 13, 1941, 82 y.o.   MRN: [id number]  Patient here for  Chief Complaint  Patient presents with   Medical Management of Chronic Issues    HPI She is accompanied by her husband. History obtained from both of them.  Was evaluated at acute care for left hip pain. Diagnosed with trochanteric bursitis. Pain localized to left lateral hip.  Pain with walking.  Started 02/22/23.  States she was working with PT and noticed some increased pain after doing repetitions - sitting and standing.  No fall.  No known injury. Was treated with prednisone 5 day and recommended heat.  Describes persistent pain with walking.  Feels her hip turns out to the left.  Has had issues with chronic lower extremity edema.  Previously noticed a "knot" left lateral leg.  This has resolved. Now with knot - right medial leg.  No increased swelling.  The pain involves the left lateral hip and extends down her leg.  She has also noticed some increased sob with increased exertion - today.  No chest pain.  Some minimal cough.  Had covid in 01/2023.  Feels much better than when had covid.     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HCC)    a.) 2/2 scleroderma Dx   LBBB (left bundle branch block)    Long term current use of antithrombotics/antiplatelets    a.) ASA + clopidogrel   Long term current use of immunosuppressive drug    a.) MTX for RA and scleroderma Dx   OSA on CPAP    Osteoarthritis    a.) knees, spine   PAD (peripheral artery disease) (HCC)    Phlebitis    a.) x 2 (with pregnancy)    Pulmonary fibrosis (HCC)    a.) mild   Pulmonary hypertension (HCC) 02/24/2012   a.) TTE 02/24/2012: EF 55%, RVSP 33; b.) TTE 10/12/2014: EF >55%, RVSP 36.1; c.) TTE 09/02/2017: EF 55%, RVSP 45.5   Reflux esophagitis    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 b.) renal US 05/23/2020: solid LEFT renal mass measuring 1.4 x 1.5 x 1.6. 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, sclerodactyly, telangiectasias   Skin cancer 2018   Supplemental oxygen dependent    a.) 2L/Hardesty PRN for DOE related to mild pulmonary fibrosis and pulmonary HTN   T2DM (type 2 diabetes mellitus) (HCC)    Past Surgical History:  Procedure Laterality Date   ABDOMINAL AORTOGRAM W/LOWER EXTREMITY Left 05/16/2019   Procedure: ABDOMINAL AORTOGRAM W/LOWER EXTREMITY;  Surgeon: Runell Gess, MD;  [facility]: [facility];  Service: Cardiovascular;  Laterality: Left;   AMPUTATION TOE Left 06/02/2019  Procedure: AMPUTATION TOE;  Surgeon: Recardo Evangelist, DPM;  Location: [facility];  Service: Podiatry;  Laterality: Left;   APPENDECTOMY  1972   BREAST CYST ASPIRATION Left    neg   COLONOSCOPY WITH PROPOFOL N/A 09/26/2021   Procedure: COLONOSCOPY WITH PROPOFOL;  Surgeon: Jaynie Collins, DO;  Location: [facility];  Service: Gastroenterology;  Laterality: N/A;  DM, WHEELCHAIR, PLAVIX   CORONARY STENT INTERVENTION N/A 11/24/2022   Procedure: CORONARY STENT INTERVENTION;  Surgeon: Iran Ouch, MD;  Location: [facility];  Service: Cardiovascular;  Laterality: N/A;   ESOPHAGOGASTRODUODENOSCOPY (EGD) WITH PROPOFOL N/A 09/26/2021   Procedure: ESOPHAGOGASTRODUODENOSCOPY (EGD) WITH PROPOFOL;  Surgeon: Jaynie Collins, DO;  Location: [facility] ENDOSCOPY;  Service: Gastroenterology;  Laterality: N/A;   INCISION AND DRAINAGE OF WOUND Left 06/07/2019   Procedure: IRRIGATION AND DEBRIDEMENT WOUND;  Surgeon: Recardo Evangelist, DPM;  Location: [facility];  Service: Podiatry;  Laterality: Left;   KNEE ARTHROPLASTY Right 11/13/2021   Procedure: COMPUTER ASSISTED TOTAL KNEE ARTHROPLASTY;  Surgeon: Donato Heinz, MD;  Location: [facility] OR[facility];  Service: Orthopedics;  Laterality: Right;   LEFT HEART CATH AND CORONARY ANGIOGRAPHY Right 11/24/2022   Procedure: LEFT HEART CATH AND CORONARY ANGIOGRAPHY with possible coronary intervention;  Surgeon: Laurier Nancy, MD;  Location: [facility];  Service: Cardiovascular;  Laterality: Right;   LOWER EXTREMITY ANGIOGRAPHY Left 06/03/2019   Procedure: Lower Extremity Angiography;  Surgeon: Renford Dills, MD;  Location: [facility];  Service: Cardiovascular;  Laterality: Left;   LOWER EXTREMITY ANGIOGRAPHY Right 05/21/2021   Procedure: LOWER EXTREMITY ANGIOGRAPHY;  Surgeon: Renford Dills, MD;  Location: [facility];  Service: Cardiovascular;  Laterality: Right;   PERIPHERAL VASCULAR BALLOON ANGIOPLASTY Left 05/16/2019   Procedure: PERIPHERAL VASCULAR BALLOON ANGIOPLASTY;  Surgeon: Runell Gess, MD;  Location: [facility];  Service: Cardiovascular;  Laterality: Left;  SFA   TUBAL LIGATION  1972   VEIN LIGATION AND STRIPPING     Family History  Problem Relation Age of Onset   Arthritis Mother    Heart attack Father    Cancer Sister    Cancer - Prostate Brother    Colon cancer Neg Hx    Breast cancer Neg Hx    Social History   Socioeconomic History   Marital status: Married    Spouse name: [name]   Number of children: 2   Years of education: Not on file   Highest education level: Not on file  Occupational History   Occupation: retired    Comment: Market researcher  Tobacco Use   Smoking status: Former    Current  packs/day: 0.00    Average packs/day: 0.5 packs/day for 15.0 years (7.5 ttl pk-yrs)    Types: Cigarettes    Start date: 05/12/1973    Quit date: 05/12/1988    Years since quitting: 34.8   Smokeless tobacco: Never  Vaping Use   Vaping status: Never Used  Substance and Sexual Activity   Alcohol use: No    Alcohol/week: 0.0 standard drinks of alcohol   Drug use: No   Sexual activity: Yes  Other Topics Concern   Not on file  Social History Narrative   Lives at home with spouse [name]    Social Determinants of Health   Financial Resource Strain: Low Risk  (01/15/2023)   Overall Financial Resource Strain (CARDIA)    Difficulty of Paying Living Expenses: Not hard at all  Food Insecurity: No Food  Insecurity (01/15/2023)   Hunger Vital Sign    Worried About Running Out of Food in the Last Year: Never true    Ran Out of Food in the Last Year: Never true  Transportation Needs: No Transportation Needs (01/15/2023)   PRAPARE - Administrator, Civil Service (Medical): No    Lack of Transportation (Non-Medical): No  Physical Activity: Unknown (01/15/2023)   Exercise Vital Sign    Days of Exercise per Week: 7 days    Minutes of Exercise per Session: Not on file  Stress: No Stress Concern Present (01/15/2023)   [employer] of Occupational Health - Occupational Stress Questionnaire    Feeling of Stress : Not at all  Social Connections: Moderately Integrated (01/15/2023)   Social Connection and Isolation Panel [NHANES]    Frequency of Communication with Friends and Family: More than three times a week    Frequency of Social Gatherings with Friends and Family: Twice a week    Attends Religious Services: More than 4 times per year    Active Member of [employer] or Organizations: No    Attends Banker Meetings: Never    Marital Status: Married     Review of Systems  Constitutional:  Negative for appetite change and unexpected weight change.  HENT:  Negative for congestion and  sinus pressure.   Respiratory:  Positive for shortness of breath. Negative for cough and chest tightness.   Cardiovascular:  Positive for leg swelling. Negative for chest pain and palpitations.  Gastrointestinal:  Negative for abdominal pain, diarrhea, nausea and vomiting.  Genitourinary:  Negative for difficulty urinating and dysuria.  Musculoskeletal:        Left hip pain.  No back pain.   Skin:  Negative for color change and rash.       "Knot" - right medial lower leg. Chronic swelling  Neurological:  Negative for dizziness and headaches.  Psychiatric/Behavioral:  Negative for agitation and dysphoric mood.        Objective:     BP 120/70   Pulse 68   Temp 98.2 F (36.8 C)   Resp 16   Ht 5\' 6"  (1.676 m)   Wt 204 lb 9.6 oz (92.8 kg)   LMP 05/25/1988   SpO2 98%   BMI 33.02 kg/m  Wt Readings from Last 3 Encounters:  03/02/23 204 lb 9.6 oz (92.8 kg)  02/04/23 201 lb 3.2 oz (91.3 kg)  01/15/23 199 lb (90.3 kg)    Physical Exam Vitals reviewed.  Constitutional:      General: She is not in acute distress.    Appearance: Normal appearance.  HENT:     Head: Normocephalic and atraumatic.     Right Ear: External ear normal.     Left Ear: External ear normal.  Eyes:     General: No scleral icterus.       Right eye: No discharge.        Left eye: No discharge.     Conjunctiva/sclera: Conjunctivae normal.  Neck:     Thyroid: No thyromegaly.  Cardiovascular:     Rate and Rhythm: Normal rate and regular rhythm.  Pulmonary:     Effort: No respiratory distress.     Breath sounds: Normal breath sounds. No wheezing.  Abdominal:     General: Bowel sounds are normal.     Palpations: Abdomen is soft.     Tenderness: There is no abdominal tenderness.  Musculoskeletal:     Cervical back: Neck supple.  No tenderness.     Comments: Venostasis changes - lower extremity.  Chronic swelling.  Increased raised tender - soft tissue = medial right lower extremity.  No calf tenderness.  No  swelling extending up leg. Increased pain - left lateral hip.  No pain with sitting.  No pain with abduction or adduction - left lower leg.  Dorsiflexion and plantar flexion - intact bilateral.   Lymphadenopathy:     Cervical: No cervical adenopathy.  Skin:    Findings: No erythema or rash.  Neurological:     Mental Status: She is alert.  Psychiatric:        Mood and Affect: Mood normal.        Behavior: Behavior normal.      Outpatient Encounter Medications as of 03/02/2023  Medication Sig   doxycycline (VIBRA-TABS) 100 MG tablet Take 1 tablet (100 mg total) by mouth 2 (two) times daily.   acetaminophen (TYLENOL) 650 MG CR tablet Take 650-1,300 mg by mouth every 8 (eight) hours as needed for pain.   allopurinol (ZYLOPRIM) 100 MG tablet Take 1 tablet (100 mg total) by mouth daily.   amLODipine (NORVASC) 5 MG tablet Take 1 tablet (5 mg total) by mouth daily.   aspirin EC 81 MG tablet Take 1 tablet (81 mg total) by mouth daily. Swallow whole.   atorvastatin (LIPITOR) 80 MG tablet Take 1 tablet (80 mg total) by mouth daily.   clopidogrel (PLAVIX) 75 MG tablet Take 1 tablet (75 mg total) by mouth daily with breakfast.   fexofenadine (ALLEGRA) 180 MG tablet Take 1 tablet (180 mg total) by mouth daily. (Patient taking differently: Take 180 mg by mouth daily as needed for allergies.)   folic acid (FOLVITE) 1 MG tablet Take 1 mg by mouth every evening.    furosemide (LASIX) 20 MG tablet Take 20 mg by mouth daily.   glucose blood (BAYER CONTOUR TEST) test strip USE  STRIP TO CHECK GLUCOSE TWICE DAILY. Dx: E11.9   ipratropium (ATROVENT) 0.06 % nasal spray Place 2 sprays into both nostrils 4 (four) times daily. (Patient taking differently: Place 2 sprays into both nostrils 4 (four) times daily as needed for rhinitis.)   losartan (COZAAR) 100 MG tablet Take 1 tablet (100 mg total) by mouth daily.   Magnesium 200 MG TABS Take by mouth. 2 tablets in the morning and 1 tablet at night   metFORMIN  (GLUCOPHAGE) 500 MG tablet Take 1 tablet by mouth once daily   methotrexate (RHEUMATREX) 2.5 MG tablet Take 2.5 mg by mouth every Wednesday.   metoprolol succinate (TOPROL-XL) 100 MG 24 hr tablet Take 1 tablet (100 mg total) by mouth daily. Take with or immediately following a meal.   MICROLET LANCETS MISC Check sugar once daily, [employer] Microlet Lancets. Dx E11.9   nitrofurantoin, macrocrystal-monohydrate, (MACROBID) 100 MG capsule Take 1 capsule (100 mg total) by mouth 2 (two) times daily.   nitroGLYCERIN (NITROSTAT) 0.4 MG SL tablet Place 1 tablet (0.4 mg total) under the tongue every 5 (five) minutes x 3 doses as needed for chest pain.   pantoprazole (PROTONIX) 40 MG tablet Take 1 tablet by mouth once daily   spironolactone (ALDACTONE) 25 MG tablet Take 1 tablet (25 mg total) by mouth daily.   torsemide (DEMADEX) 20 MG tablet Take 20 mg by mouth as needed. Taking 1/2 tablet just as needed   No facility-administered encounter medications on file as of 03/02/2023.     Lab Results  Component Value Date  WBC 9.4 03/02/2023   HGB 12.7 03/02/2023   HCT 40.3 03/02/2023   PLT 166.0 03/02/2023   GLUCOSE 79 03/02/2023   CHOL 95 03/02/2023   TRIG 125.0 03/02/2023   HDL 41.20 03/02/2023   LDLCALC 29 03/02/2023   ALT 20 03/02/2023   AST 18 03/02/2023   NA 139 03/02/2023   K 4.0 03/02/2023   CL 104 03/02/2023   CREATININE 0.99 03/02/2023   BUN 27 (H) 03/02/2023   CO2 27 03/02/2023   TSH 4.01 03/02/2023   INR 1.1 11/21/2022   HGBA1C 6.3 03/02/2023   MICROALBUR 17.6 (H) 12/19/2021    US Renal  Result Date: 03/02/2023 CLINICAL DATA:  renal lesion.  compare to previous EXAM: RENAL / URINARY TRACT ULTRASOUND COMPLETE COMPARISON:  01/23/2022 and 05/23/2020. FINDINGS: The right kidney measured 9.2 cm and the left kidney measured 8.7 cm. The kidneys demonstrate normal echogenicity. Echogenic lesion lower pole left kidney consistent with an angiomyolipoma is stable finding measuring 1.5 x 1.4  x 1.1 cm. No shadowing stones are seen. No hydronephrosis. Bladder: Appears normal for degree of bladder distention. 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 Otherwise unremarkable study. Electronically Signed   By: Layla Maw M.D.   On: 03/02/2023 09:40       Assessment & Plan:  SOB (shortness of breath) on exertion Assessment & Plan: Reported sob on exertion.  Has recently noticed. Has known CAD.  Has seen Dr Azucena Cecil). CAD s/p PCI to RV marginal branch 11/2022 (70% OM1, 80% second diagonal).  EF 50 to 55%.  Continue aspirin, Plavix, Lipitor 80.  EKG - SR with new TWI in v2. Discussed further cardiac w/up and evaluation.  Agreeable for f/u.  Recent covid infection.  No increased cough or congestion.  Pending above evaluation, consider cxr.   Orders: -     EKG 12-Lead -     CBC with Differential/Platelet  Decreased GFR -     Basic metabolic panel  Type 2 diabetes mellitus without complication, without long-term current use of insulin (HCC) -     Hemoglobin A1c  Thrombocytopenia (HCC) Assessment & Plan: Follow cbc.    Swelling of lower extremity  Rheumatoid arthritis involving multiple sites with positive rheumatoid factor (HCC) Assessment & Plan: On MTX.  Followed by rheumatology.    Hypomagnesemia Assessment & Plan: Recheck magnesium level today   Orders: -     Magnesium  Hypercholesterolemia Assessment & Plan: Continue lipitor.  Low cholesterol diet and exercise.  Follow lipid panel and liver function tests.    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  Raised - tender area - right lower extremity.  No calf tenderness.  No swelling extending up the leg.  Treat with doxycycline as directed.  Leg elevation.  Follow.  DP pulses palpable and equal  bilaterally.    Renal angiolipoma Assessment & Plan: Dr Lonna Cobb (01/30/22) - stable.  F/u one year. Just had f/u renal ultrasound 02/2023 - no interval change.     Pulmonary hypertension, mild (HCC) Assessment & Plan: Continue f/u with pulmonary/cardiology.    Primary hypertension Assessment & Plan: Continue metoprolol, losartan and amlodipine.  Spironolactone added during recent hospitalization. Follow pressures.  Follow metabolic panel.    History of colon polyps Assessment & Plan: Colonoscopy 05/14/12 -  three polyps - recto sigmoid colon.  Diverticulosis in the sigmoid and transverse colon.  Internal hemorrhoids.    Gastroesophageal reflux disease with esophagitis without hemorrhage Assessment & Plan: No upper symptoms reported.  Continue protonix.    Type 2 diabetes mellitus with other circulatory complication, without long-term current use of insulin (HCC) Assessment & Plan: On metformin.  Low carb diet and exercise.  Follow met b and a1c.     CREST variant of scleroderma (HCC) Assessment & Plan: Followed by [facility]/rheumatology.    Stage 3a chronic kidney disease (HCC) Assessment & Plan: Avoid antiinflammatories.  Stay hydrated.  Follow metabolic panel. Continue losartan.    Cardiomyopathy, unspecified type [facility]) Assessment & Plan: Echocardiogram 11/2022 - EF 50-55% with grade III DD, right and left atrium moderately dilated with mild MR and mild AR. Continue spironolactone, metoprolol, losartan.    Coronary artery disease involving native coronary artery of native heart without angina pectoris Assessment & Plan: Cardiology (Dr Azucena Cecil). CAD s/p PCI to RV marginal branch 11/2022 (70% OM1, 80% second diagonal).  EF 50 to 55%.  Continue aspirin, Plavix, Lipitor 80.  Continue cardiac rehab.    Aortic atherosclerosis (HCC) Assessment & Plan: Continue lipitor.    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  Followed by  [facility].    Cellulitis of right lower extremity Assessment & Plan: Tender area - right medial lower leg.  Treat with doxycycline.  Warm compress.  Follow.  Let elevation.  Call with update.    Left hip pain Assessment & Plan: Pain with walking - lateral left hip.  No pain when sitting.  No pain with SLR.  No pain with abduction/adduction of leg.  Previously noticed pain radiating to lower leg.  Discussed possible trochanteric bursitis.  Treated with prednisone.  Was receiving PT.  On hold. Felt aggravated.  Get her in to follow up with ortho.  When walking, she is slow to pull the right foot forward.  She reports is related to the increased pain in the left hip.  No focal neurological deficit noted on exam - right foot or leg.  Plan f/u with ortho as outlined.    Other orders -     Doxycycline Hyclate; Take 1 tablet (100 mg total) by mouth 2 (two) times daily.  Dispense: 14 tablet; Refill: 0   I spent 45 minutes with the patient.  Time spent discussing her current concerns and symptoms.  Specifically time spent discussing her increased pain - left hip and sob. Time also spent discussing further w/up, evaluation and treatment.    Dale Limestone, MD

## 2023-03-02 NOTE — Assessment & Plan Note (Signed)
Followed by [facility]/rheumatology.

## 2023-03-02 NOTE — Assessment & Plan Note (Addendum)
Reported sob on exertion.  Has recently noticed. Has known CAD.  Has seen Dr Azucena Cecil). CAD s/p PCI to RV marginal branch 11/2022 (70% OM1, 80% second diagonal).  EF 50 to 55%.  Continue aspirin, Plavix, Lipitor 80.  EKG - SR with new TWI in v2. Discussed further cardiac w/up and evaluation.  Agreeable for f/u.  Recent covid infection.  No increased cough or congestion.  Pending above evaluation, consider cxr.

## 2023-03-02 NOTE — Assessment & Plan Note (Signed)
Recheck magnesium level today.  

## 2023-03-02 NOTE — Assessment & Plan Note (Signed)
Follow cbc.  

## 2023-03-02 NOTE — Assessment & Plan Note (Signed)
On metformin.  Low carb diet and exercise.  Follow met b and a1c.   

## 2023-03-02 NOTE — Assessment & Plan Note (Signed)
Dr Lonna Cobb (01/30/22) - stable.  F/u one year. Just had f/u renal ultrasound 02/2023 - no interval change.

## 2023-03-02 NOTE — Assessment & Plan Note (Signed)
Pain with walking - lateral left hip.  No pain when sitting.  No pain with SLR.  No pain with abduction/adduction of leg.  Previously noticed pain radiating to lower leg.  Discussed possible trochanteric bursitis.  Treated with prednisone.  Was receiving PT.  On hold. Felt aggravated.  Get her in to follow up with ortho.  When walking, she is slow to pull the right foot forward.  She reports is related to the increased pain in the left hip.  No focal neurological deficit noted on exam - right foot or leg.  Plan f/u with ortho as outlined.

## 2023-03-02 NOTE — Assessment & Plan Note (Signed)
Continue lipitor.  Low cholesterol diet and exercise.  Follow lipid panel and liver function tests.   

## 2023-03-02 NOTE — Assessment & Plan Note (Signed)
Tender area - right medial lower leg.  Treat with doxycycline.  Warm compress.  Follow.  Let elevation.  Call with update.

## 2023-03-02 NOTE — Assessment & Plan Note (Signed)
No upper symptoms reported.  Continue protonix.  

## 2023-03-02 NOTE — Assessment & Plan Note (Signed)
Chronic swelling and chronic venostasis changes noted lower extremity.  Raised - tender area - right lower extremity.  No calf tenderness.  No swelling extending up the leg.  Treat with doxycycline as directed.  Leg elevation.  Follow.  DP pulses palpable and equal bilaterally.

## 2023-03-02 NOTE — Assessment & Plan Note (Signed)
Continue metoprolol, losartan and amlodipine.  Spironolactone added during recent hospitalization. Follow pressures.  Follow metabolic panel.

## 2023-03-02 NOTE — Assessment & Plan Note (Signed)
Cardiology (Dr Azucena Cecil). CAD s/p PCI to RV marginal branch 11/2022 (70% OM1, 80% second diagonal).  EF 50 to 55%.  Continue aspirin, Plavix, Lipitor 80.  Continue cardiac rehab.

## 2023-03-02 NOTE — Assessment & Plan Note (Signed)
Continue f/u with pulmonary/cardiology.

## 2023-03-02 NOTE — Assessment & Plan Note (Signed)
S/p amputation.  Followed by [facility].  ?

## 2023-03-02 NOTE — Assessment & Plan Note (Signed)
Avoid antiinflammatories.  Stay hydrated.  Follow metabolic panel.  Continue losartan.

## 2023-03-02 NOTE — Assessment & Plan Note (Signed)
Colonoscopy 05/14/12 - three polyps - recto sigmoid colon.  Diverticulosis in the sigmoid and transverse colon.  Internal hemorrhoids.

## 2023-03-03 ENCOUNTER — Other Ambulatory Visit: Payer: Self-pay

## 2023-03-09 ENCOUNTER — Telehealth: Payer: Self-pay | Admitting: Internal Medicine

## 2023-03-09 ENCOUNTER — Other Ambulatory Visit (INDEPENDENT_AMBULATORY_CARE_PROVIDER_SITE_OTHER): Payer: Medicare Other

## 2023-03-09 LAB — MAGNESIUM: Magnesium: 1.8 mg/dL (ref 1.5–2.5)

## 2023-03-09 NOTE — Telephone Encounter (Signed)
Patient just called and said she would like for someone to call her about her medication. Her number is [phone].  The name of the medication is amLODipine (NORVASC) 5 MG tablet.

## 2023-03-10 NOTE — Telephone Encounter (Signed)
Patient was calling in to clarify her allopurinol. She says in the past she took 300 mg q day. Reviewed her chart with her and explained that she was on 300 mg in the past but her last 2 prescriptions were 100 mg q day. Patient is ok with taking the 100 mg q day if this is okay with you. (The rx you sent in 11/2022 was 100 mg)

## 2023-03-10 NOTE — Telephone Encounter (Signed)
Patient aware.

## 2023-03-10 NOTE — Telephone Encounter (Signed)
In reviewing the chart, it appears that in spring of 2024 - rheumatology med list had allopurinol 300mg  1/2 tablet q day. The following dose was listed as 100mg  allopurinol q day.  If she has been on the 100mg  and doing ok, I would like to continue 100mg  allopurinol q day.

## 2023-03-12 DIAGNOSIS — Z48812 Encounter for surgical aftercare following surgery on the circulatory system: Secondary | ICD-10-CM

## 2023-03-12 DIAGNOSIS — M109 Gout, unspecified: Secondary | ICD-10-CM

## 2023-03-12 DIAGNOSIS — D696 Thrombocytopenia, unspecified: Secondary | ICD-10-CM

## 2023-03-12 DIAGNOSIS — K219 Gastro-esophageal reflux disease without esophagitis: Secondary | ICD-10-CM

## 2023-03-12 DIAGNOSIS — E1122 Type 2 diabetes mellitus with diabetic chronic kidney disease: Secondary | ICD-10-CM

## 2023-03-12 DIAGNOSIS — E1151 Type 2 diabetes mellitus with diabetic peripheral angiopathy without gangrene: Secondary | ICD-10-CM

## 2023-03-12 DIAGNOSIS — K449 Diaphragmatic hernia without obstruction or gangrene: Secondary | ICD-10-CM

## 2023-03-12 DIAGNOSIS — I129 Hypertensive chronic kidney disease with stage 1 through stage 4 chronic kidney disease, or unspecified chronic kidney disease: Secondary | ICD-10-CM

## 2023-03-12 DIAGNOSIS — I214 Non-ST elevation (NSTEMI) myocardial infarction: Secondary | ICD-10-CM

## 2023-03-12 DIAGNOSIS — N183 Chronic kidney disease, stage 3 unspecified: Secondary | ICD-10-CM

## 2023-03-12 DIAGNOSIS — M069 Rheumatoid arthritis, unspecified: Secondary | ICD-10-CM

## 2023-03-12 DIAGNOSIS — I25119 Atherosclerotic heart disease of native coronary artery with unspecified angina pectoris: Secondary | ICD-10-CM

## 2023-03-16 ENCOUNTER — Other Ambulatory Visit: Payer: Self-pay | Admitting: Family Medicine

## 2023-03-16 DIAGNOSIS — M5416 Radiculopathy, lumbar region: Secondary | ICD-10-CM

## 2023-03-17 ENCOUNTER — Other Ambulatory Visit: Payer: Medicare Other

## 2023-03-18 ENCOUNTER — Telehealth: Payer: Self-pay | Admitting: Internal Medicine

## 2023-03-18 ENCOUNTER — Ambulatory Visit: Payer: Medicare Other

## 2023-03-18 ENCOUNTER — Other Ambulatory Visit (INDEPENDENT_AMBULATORY_CARE_PROVIDER_SITE_OTHER): Payer: Medicare Other

## 2023-03-18 DIAGNOSIS — R300 Dysuria: Secondary | ICD-10-CM

## 2023-03-18 NOTE — Telephone Encounter (Signed)
Patient just called and said she has a possible UTI. She wanted to know does she need lab orders. Her number is [phone]. She would like for someone to call her.

## 2023-03-18 NOTE — Telephone Encounter (Signed)
Urine ordered

## 2023-03-18 NOTE — Telephone Encounter (Signed)
LMTCB. I reviewed her chart. She does not need blood work but if she would like to come in today and leave a urine sample for her appt tomorrow, I can place orders. Schedule lab appt.

## 2023-03-19 ENCOUNTER — Encounter: Payer: Self-pay | Admitting: Internal Medicine

## 2023-03-19 ENCOUNTER — Telehealth: Payer: Self-pay

## 2023-03-19 ENCOUNTER — Ambulatory Visit: Payer: Medicare Other | Admitting: Internal Medicine

## 2023-03-19 DIAGNOSIS — E7800 Pure hypercholesterolemia, unspecified: Secondary | ICD-10-CM

## 2023-03-19 DIAGNOSIS — I700 Atherosclerosis of aorta: Secondary | ICD-10-CM

## 2023-03-19 DIAGNOSIS — I10 Essential (primary) hypertension: Secondary | ICD-10-CM

## 2023-03-19 DIAGNOSIS — M341 CR(E)ST syndrome: Secondary | ICD-10-CM

## 2023-03-19 DIAGNOSIS — G8929 Other chronic pain: Secondary | ICD-10-CM

## 2023-03-19 DIAGNOSIS — I890 Lymphedema, not elsewhere classified: Secondary | ICD-10-CM

## 2023-03-19 DIAGNOSIS — I2510 Atherosclerotic heart disease of native coronary artery without angina pectoris: Secondary | ICD-10-CM

## 2023-03-19 DIAGNOSIS — I272 Pulmonary hypertension, unspecified: Secondary | ICD-10-CM

## 2023-03-19 DIAGNOSIS — Z89422 Acquired absence of other left toe(s): Secondary | ICD-10-CM

## 2023-03-19 DIAGNOSIS — N1831 Chronic kidney disease, stage 3a: Secondary | ICD-10-CM

## 2023-03-19 DIAGNOSIS — M0579 Rheumatoid arthritis with rheumatoid factor of multiple sites without organ or systems involvement: Secondary | ICD-10-CM

## 2023-03-19 DIAGNOSIS — N281 Cyst of kidney, acquired: Secondary | ICD-10-CM

## 2023-03-19 DIAGNOSIS — M25569 Pain in unspecified knee: Secondary | ICD-10-CM

## 2023-03-19 DIAGNOSIS — E119 Type 2 diabetes mellitus without complications: Secondary | ICD-10-CM

## 2023-03-19 DIAGNOSIS — I70221 Atherosclerosis of native arteries of extremities with rest pain, right leg: Secondary | ICD-10-CM

## 2023-03-19 DIAGNOSIS — I429 Cardiomyopathy, unspecified: Secondary | ICD-10-CM

## 2023-03-19 DIAGNOSIS — R300 Dysuria: Secondary | ICD-10-CM

## 2023-03-19 DIAGNOSIS — D696 Thrombocytopenia, unspecified: Secondary | ICD-10-CM

## 2023-03-19 LAB — URINALYSIS, ROUTINE W REFLEX MICROSCOPIC
Bilirubin Urine: NEGATIVE
Hgb urine dipstick: NEGATIVE
Ketones, ur: NEGATIVE
Nitrite: POSITIVE — AB
RBC / HPF: NONE SEEN (ref 0–?)
Specific Gravity, Urine: 1.01 (ref 1.000–1.030)
Total Protein, Urine: 30 — AB
Urine Glucose: NEGATIVE
Urobilinogen, UA: 0.2 (ref 0.0–1.0)
pH: 8.5 — AB (ref 5.0–8.0)

## 2023-03-19 MED ORDER — MAGNESIUM OXIDE 400 MG PO TABS: 400.0000 mg | ORAL_TABLET | Freq: Two times a day (BID) | ORAL | 1 refills | Status: DC

## 2023-03-19 MED ORDER — NITROFURANTOIN MONOHYD MACRO 100 MG PO CAPS: 100.0000 mg | ORAL_CAPSULE | Freq: Two times a day (BID) | ORAL | 0 refills | Status: DC

## 2023-03-19 NOTE — Telephone Encounter (Signed)
Patient just called back. I read her the message. And she said she will start on the macrobid 100 mg. The pharmacy she use is [facility] [address] [place] - [address] [place], [place] [address] Phone: [phone]  Fax: [phone]

## 2023-03-19 NOTE — Telephone Encounter (Signed)
-----   Message from [place] sent at 03/19/2023  1:02 PM EST ----- Notify Nina Perez that her urinalysis does appear to be c/w UTI.  Please confirm only abx allergy is keflex.  If so, then I would like to start macrobid 100mg  bid x 5 days.

## 2023-03-19 NOTE — Addendum Note (Signed)
Addended by: Rita Ohara D on: 03/19/2023 03:10 PM   Modules accepted: Orders

## 2023-03-19 NOTE — Telephone Encounter (Signed)
Medication sent to [facility] in [facility]

## 2023-03-19 NOTE — Progress Notes (Addendum)
Subjective:    Patient ID: Nina Perez, female    DOB: June 26, 1940, 81 y.o.   MRN: [id number]  Patient here for  Chief Complaint  Patient presents with   Medical Management of Chronic Issues    HPI Here for a physical exam. Given multiple concerns, appt was changed to follow up appt. She is accompanied by her husband.  History obtained from both of them. Saw ortho 03/05/23 - hip and leg pain. Recommended gabapentin and referral to physiatry. Also continue home exercises - lumbar radiculitis. PT is working with her. Saw physiatry 03/16/23 - recommended continue tylenol and gabapentin. MRI ordered.  She declined to have MRI. She fell two days ago.  Left and right knee - bruised.  Did bump her left temple - bruise.  No headache, no pain, no dizziness or light headedness - since fall. Increased pain - left knee with weight bearing.  No increased erythema. Still with sob with exertion.  Noticed some increased sob - just walking from room to room.  No cough or congestion.  No abdominal pain.     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HCC)    a.) 2/2 scleroderma Dx   LBBB (left bundle branch block)    Long term current use of antithrombotics/antiplatelets    a.) ASA + clopidogrel   Long term current use of immunosuppressive drug    a.) MTX for RA and scleroderma Dx   OSA on CPAP    Osteoarthritis    a.) knees, spine   PAD (peripheral artery disease) (HCC)    Phlebitis    a.) x 2 (with pregnancy)   Pulmonary fibrosis (HCC)    a.) mild   Pulmonary hypertension (HCC) 02/24/2012   a.) TTE 02/24/2012: EF 55%, RVSP  33; b.) TTE 10/12/2014: EF >55%, RVSP 36.1; c.) TTE 09/02/2017: EF 55%, RVSP 45.5   Reflux esophagitis    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 b.) renal US 05/23/2020: solid LEFT renal mass measuring 1.4 x 1.5 x 1.6. 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, sclerodactyly, telangiectasias   Skin cancer 2018   Supplemental oxygen dependent    a.) 2L/[place] PRN for DOE related to mild pulmonary fibrosis and pulmonary HTN   T2DM (type 2 diabetes mellitus) (HCC)    Past Surgical History:  Procedure Laterality Date   ABDOMINAL AORTOGRAM W/LOWER EXTREMITY Left 05/16/2019   Procedure: ABDOMINAL AORTOGRAM W/LOWER EXTREMITY;  Surgeon: Runell Gess, MD;  [facility];  Service: Cardiovascular;  Laterality: Left;   AMPUTATION TOE Left 06/02/2019   Procedure: AMPUTATION TOE;  Surgeon: Recardo Evangelist, DPM;  Location: [facility] ORS;  Service: Podiatry;  Laterality: Left;   APPENDECTOMY  1972  BREAST CYST ASPIRATION Left    neg   COLONOSCOPY WITH PROPOFOL N/A 09/26/2021   Procedure: COLONOSCOPY WITH PROPOFOL;  Surgeon: Jaynie Collins, DO;  Location: [facility] ENDOSCOPY;  Service: Gastroenterology;  Laterality: N/A;  DM, WHEELCHAIR, PLAVIX   CORONARY STENT INTERVENTION N/A 11/24/2022   Procedure: CORONARY STENT INTERVENTION;  Surgeon: Iran Ouch, MD;  Location: [facility];  Service: Cardiovascular;  Laterality: N/A;   ESOPHAGOGASTRODUODENOSCOPY (EGD) WITH PROPOFOL N/A 09/26/2021   Procedure: ESOPHAGOGASTRODUODENOSCOPY (EGD) WITH PROPOFOL;  Surgeon: Jaynie Collins, DO;  Location: [facility] ENDOSCOPY;  Service: Gastroenterology;  Laterality: N/A;   INCISION AND DRAINAGE OF WOUND  Left 06/07/2019   Procedure: IRRIGATION AND DEBRIDEMENT WOUND;  Surgeon: Recardo Evangelist, DPM;  Location: [facility] ORS;  Service: Podiatry;  Laterality: Left;   KNEE ARTHROPLASTY Right 11/13/2021   Procedure: COMPUTER ASSISTED TOTAL KNEE ARTHROPLASTY;  Surgeon: Donato Heinz, MD;  Location: [facility] ORS;  Service: Orthopedics;  Laterality: Right;   LEFT HEART CATH AND CORONARY ANGIOGRAPHY Right 11/24/2022   Procedure: LEFT HEART CATH AND CORONARY ANGIOGRAPHY with possible coronary intervention;  Surgeon: Laurier Nancy, MD;  Location: [facility];  Service: Cardiovascular;  Laterality: Right;   LOWER EXTREMITY ANGIOGRAPHY Left 06/03/2019   Procedure: Lower Extremity Angiography;  Surgeon: Renford Dills, MD;  Location: [facility];  Service: Cardiovascular;  Laterality: Left;   LOWER EXTREMITY ANGIOGRAPHY Right 05/21/2021   Procedure: LOWER EXTREMITY ANGIOGRAPHY;  Surgeon: Renford Dills, MD;  Location: [facility];  Service: Cardiovascular;  Laterality: Right;   PERIPHERAL VASCULAR BALLOON ANGIOPLASTY Left 05/16/2019   Procedure: PERIPHERAL VASCULAR BALLOON ANGIOPLASTY;  Surgeon: Runell Gess, MD;  Location: [facility];  Service: Cardiovascular;  Laterality: Left;  SFA   TUBAL LIGATION  1972   VEIN LIGATION AND STRIPPING     Family History  Problem Relation Age of Onset   Arthritis Mother    Heart attack Father    Cancer Sister    Cancer - Prostate Brother    Colon cancer Neg Hx    Breast cancer Neg Hx    Social History   Socioeconomic History   Marital status: Married    Spouse name: [name]   Number of children: 2   Years of education: Not on file   Highest education level: Not on file  Occupational History   Occupation: retired    Comment: Market researcher  Tobacco Use   Smoking status: Former    Current packs/day: 0.00    Average packs/day: 0.5 packs/day for 15.0 years (7.5 ttl pk-yrs)    Types: Cigarettes    Start date:  05/12/1973    Quit date: 05/12/1988    Years since quitting: 34.8   Smokeless tobacco: Never  Vaping Use   Vaping status: Never Used  Substance and Sexual Activity   Alcohol use: No    Alcohol/week: 0.0 standard drinks of alcohol   Drug use: No   Sexual activity: Yes  Other Topics Concern   Not on file  Social History Narrative   Lives at home with spouse [name]    Social Determinants of Health   Financial Resource Strain: Low Risk  (01/15/2023)   Overall Financial Resource Strain (CARDIA)    Difficulty of Paying Living Expenses: Not hard at all  Food Insecurity: No Food Insecurity (01/15/2023)   Hunger Vital Sign    Worried About Running Out of Food in the Last Year: Never true  Ran Out of Food in the Last Year: Never true  Transportation Needs: No Transportation Needs (01/15/2023)   PRAPARE - Administrator, Civil Service (Medical): No    Lack of Transportation (Non-Medical): No  Physical Activity: Unknown (01/15/2023)   Exercise Vital Sign    Days of Exercise per Week: 7 days    Minutes of Exercise per Session: Not on file  Stress: No Stress Concern Present (01/15/2023)   [employer] of Occupational Health - Occupational Stress Questionnaire    Feeling of Stress : Not at all  Social Connections: Moderately Integrated (01/15/2023)   Social Connection and Isolation Panel [NHANES]    Frequency of Communication with Friends and Family: More than three times a week    Frequency of Social Gatherings with Friends and Family: Twice a week    Attends Religious Services: More than 4 times per year    Active Member of [employer] or Organizations: No    Attends Banker Meetings: Never    Marital Status: Married     Review of Systems  Constitutional:  Negative for appetite change and unexpected weight change.  HENT:  Negative for congestion and sinus pressure.   Respiratory:  Positive for shortness of breath. Negative for cough and chest tightness.    Cardiovascular:  Positive for leg swelling. Negative for chest pain and palpitations.       Stable lower extremity swelling   Gastrointestinal:  Negative for diarrhea, nausea and vomiting.  Genitourinary:  Negative for difficulty urinating and dysuria.  Musculoskeletal:  Negative for myalgias.       Increased pain - left knee - bruised  Skin:  Negative for color change and rash.  Neurological:  Negative for dizziness and headaches.  Psychiatric/Behavioral:  Negative for agitation and dysphoric mood.        Objective:     BP 122/70   Pulse 65   Temp 98.2 F (36.8 C)   Resp 16   Ht 5\' 6"  (1.676 m)   Wt 202 lb (91.6 kg)   LMP 05/25/1988   SpO2 98%   BMI 32.60 kg/m  Wt Readings from Last 3 Encounters:  03/19/23 202 lb (91.6 kg)  03/02/23 204 lb 9.6 oz (92.8 kg)  02/04/23 201 lb 3.2 oz (91.3 kg)    Physical Exam Vitals reviewed.  Constitutional:      General: She is not in acute distress.    Appearance: Normal appearance.  HENT:     Head: Normocephalic and atraumatic.     Right Ear: External ear normal.     Left Ear: External ear normal.  Eyes:     General: No scleral icterus.       Right eye: No discharge.        Left eye: No discharge.     Conjunctiva/sclera: Conjunctivae normal.  Neck:     Thyroid: No thyromegaly.  Cardiovascular:     Rate and Rhythm: Normal rate and regular rhythm.  Pulmonary:     Effort: No respiratory distress.     Breath sounds: Normal breath sounds. No wheezing.  Abdominal:     General: Bowel sounds are normal.     Palpations: Abdomen is soft.     Tenderness: There is no abdominal tenderness.  Musculoskeletal:        General: No swelling or tenderness.     Cervical back: Neck supple. No tenderness.     Comments: No increased pain with flexion and extension of left lower leg.  Increased pain - with standing and attempts at walking - with assistance.   Lymphadenopathy:     Cervical: No cervical adenopathy.  Skin:    Findings: No  erythema.     Comments: Increased bruising - hematoma - left knee.  Right knee - bruising.   Neurological:     Mental Status: She is alert.  Psychiatric:        Mood and Affect: Mood normal.        Behavior: Behavior normal.      Outpatient Encounter Medications as of 03/19/2023  Medication Sig   magnesium oxide (MAG-OX) 400 MG tablet Take 1 tablet (400 mg total) by mouth 2 (two) times daily.   acetaminophen (TYLENOL) 650 MG CR tablet Take 650-1,300 mg by mouth every 8 (eight) hours as needed for pain.   allopurinol (ZYLOPRIM) 100 MG tablet Take 1 tablet (100 mg total) by mouth daily.   amLODipine (NORVASC) 5 MG tablet Take 1 tablet (5 mg total) by mouth daily.   aspirin EC 81 MG tablet Take 1 tablet (81 mg total) by mouth daily. Swallow whole.   atorvastatin (LIPITOR) 80 MG tablet Take 1 tablet (80 mg total) by mouth daily.   clopidogrel (PLAVIX) 75 MG tablet Take 1 tablet (75 mg total) by mouth daily with breakfast.   fexofenadine (ALLEGRA) 180 MG tablet Take 1 tablet (180 mg total) by mouth daily. (Patient taking differently: Take 180 mg by mouth daily as needed for allergies.)   folic acid (FOLVITE) 1 MG tablet Take 1 mg by mouth every evening.    furosemide (LASIX) 20 MG tablet Take 20 mg by mouth daily.   glucose blood (BAYER CONTOUR TEST) test strip USE  STRIP TO CHECK GLUCOSE TWICE DAILY. Dx: E11.9   ipratropium (ATROVENT) 0.06 % nasal spray Place 2 sprays into both nostrils 4 (four) times daily. (Patient taking differently: Place 2 sprays into both nostrils 4 (four) times daily as needed for rhinitis.)   losartan (COZAAR) 100 MG tablet Take 1 tablet (100 mg total) by mouth daily.   Magnesium 200 MG TABS Take by mouth. 2 tablets in the morning and 1 tablet at night   metFORMIN (GLUCOPHAGE) 500 MG tablet Take 1 tablet by mouth once daily   methotrexate (RHEUMATREX) 2.5 MG tablet Take 2.5 mg by mouth every Wednesday.   metoprolol succinate (TOPROL-XL) 100 MG 24 hr tablet Take 1  tablet (100 mg total) by mouth daily. Take with or immediately following a meal.   MICROLET LANCETS MISC Check sugar once daily, [employer] Microlet Lancets. Dx E11.9   nitroGLYCERIN (NITROSTAT) 0.4 MG SL tablet Place 1 tablet (0.4 mg total) under the tongue every 5 (five) minutes x 3 doses as needed for chest pain.   pantoprazole (PROTONIX) 40 MG tablet Take 1 tablet by mouth once daily   spironolactone (ALDACTONE) 25 MG tablet Take 1 tablet (25 mg total) by mouth daily.   torsemide (DEMADEX) 20 MG tablet Take 20 mg by mouth as needed. Taking 1/2 tablet just as needed   [DISCONTINUED] doxycycline (VIBRA-TABS) 100 MG tablet Take 1 tablet (100 mg total) by mouth 2 (two) times daily.   [DISCONTINUED] nitrofurantoin, macrocrystal-monohydrate, (MACROBID) 100 MG capsule Take 1 capsule (100 mg total) by mouth 2 (two) times daily.   No facility-administered encounter medications on file as of 03/19/2023.     Lab Results  Component Value Date   WBC 9.4 03/02/2023   HGB 12.7 03/02/2023   HCT 40.3 03/02/2023   PLT 166.0  03/02/2023   GLUCOSE 79 03/02/2023   CHOL 95 03/02/2023   TRIG 125.0 03/02/2023   HDL 41.20 03/02/2023   LDLCALC 29 03/02/2023   ALT 20 03/02/2023   AST 18 03/02/2023   NA 139 03/02/2023   K 4.0 03/02/2023   CL 104 03/02/2023   CREATININE 0.99 03/02/2023   BUN 27 (H) 03/02/2023   CO2 27 03/02/2023   TSH 4.01 03/02/2023   INR 1.1 11/21/2022   HGBA1C 6.3 03/02/2023   MICROALBUR 17.6 (H) 12/19/2021    US Renal  Result Date: 03/02/2023 CLINICAL DATA:  renal lesion.  compare to previous EXAM: RENAL / URINARY TRACT ULTRASOUND COMPLETE COMPARISON:  01/23/2022 and 05/23/2020. FINDINGS: The right kidney measured 9.2 cm and the left kidney measured 8.7 cm. The kidneys demonstrate normal echogenicity. Echogenic lesion lower pole left kidney consistent with an angiomyolipoma is stable finding measuring 1.5 x 1.4 x 1.1 cm. No shadowing stones are seen. No hydronephrosis. Bladder:  Appears normal for degree of bladder distention. 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 Otherwise unremarkable study. Electronically Signed   By: Layla Maw M.D.   On: 03/02/2023 09:40       Assessment & Plan:  Hypomagnesemia -     Magnesium; Future  Hypercholesterolemia Assessment & Plan: Continue lipitor.  Low cholesterol diet and exercise.  Follow lipid panel and liver function tests.    Orders: -     Lipid panel; Future -     Hepatic function panel; Future -     Basic metabolic panel; Future  Type 2 diabetes mellitus without complication, without long-term current use of insulin (HCC) Assessment & Plan: On metformin.  Low carb diet and exercise.  Follow met b and a1c.    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  Followed by [facility].    Aortic atherosclerosis (HCC) Assessment & Plan: Continue lipitor.    Atherosclerosis of native artery of right lower extremity with rest pain [facility]) Assessment & Plan: S/p angiogram, angioplasty and toe amputation.  Continue lipitor and plavix. Doing well.     Coronary artery disease involving native coronary artery of native heart without angina pectoris Assessment & Plan: Cardiology (Dr Azucena Cecil). CAD s/p PCI to RV marginal branch 11/2022 (70% OM1, 80% second diagonal).  EF 50 to 55%.  Continue aspirin, Plavix, Lipitor 80.     Cardiomyopathy, unspecified type [facility]) Assessment & Plan: Echocardiogram 11/2022 - EF 50-55% with grade III DD, right and left atrium moderately dilated with mild MR and mild AR. Continue spironolactone, metoprolol, losartan. Weight stable.  With persistent increased sob with exertion.  Plan f/u with cardiology to discuss.     Stage 3a chronic kidney disease (HCC) Assessment & Plan: Avoid antiinflammatories.  Stay hydrated.   Follow metabolic panel. Continue losartan.    CREST variant of scleroderma (HCC) Assessment & Plan: Followed by [facility]/rheumatology.    Thrombocytopenia (HCC) Assessment & Plan: Follow cbc.    Rheumatoid arthritis involving multiple sites with positive rheumatoid factor (HCC) Assessment & Plan: On MTX.  Followed by rheumatology.    Pulmonary hypertension, mild (HCC) Assessment & Plan: Continue f/u with pulmonary/cardiology.    Primary hypertension Assessment & Plan: Continue metoprolol, losartan and amlodipine.  Spironolactone added during recent hospitalization. Follow pressures.  Follow metabolic panel.    Lymphedema Assessment & Plan: Followed by  vascular surgery.  Continue compression hose.     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 Otherwise unremarkable study.  Has seen Dr Lonna Cobb.    Chronic knee pain, unspecified laterality Assessment & Plan: Left knee pain, bruising and swelling s/p recent fall.  Exam as outlined.  Discussed need for ortho evaluation.  Appt made.    Dysuria Assessment & Plan: Check urinalysis to confirm if infection present.    Other orders -     Magnesium Oxide; Take 1 tablet (400 mg total) by mouth 2 (two) times daily.  Dispense: 180 tablet; Refill: 1     Dale [place], MD

## 2023-03-20 LAB — URINE CULTURE
MICRO NUMBER:: 15694324
SPECIMEN QUALITY:: ADEQUATE

## 2023-03-21 ENCOUNTER — Other Ambulatory Visit: Payer: Self-pay | Admitting: Internal Medicine

## 2023-03-21 ENCOUNTER — Encounter: Payer: Self-pay | Admitting: Internal Medicine

## 2023-03-21 MED ORDER — AMOXICILLIN-POT CLAVULANATE 500-125 MG PO TABS: 1.0000 | ORAL_TABLET | Freq: Two times a day (BID) | ORAL | 0 refills | Status: DC

## 2023-03-21 NOTE — Assessment & Plan Note (Addendum)
Cardiology (Dr Azucena Cecil). CAD s/p PCI to RV marginal branch 11/2022 (70% OM1, 80% second diagonal).  EF 50 to 55%.  Continue aspirin, Plavix, Lipitor 80.

## 2023-03-21 NOTE — Assessment & Plan Note (Signed)
On MTX.  Followed by rheumatology.

## 2023-03-21 NOTE — Assessment & Plan Note (Signed)
S/p angiogram, angioplasty and toe amputation.  Continue lipitor and plavix. Doing well.   

## 2023-03-21 NOTE — Assessment & Plan Note (Signed)
Continue lipitor.  Low cholesterol diet and exercise.  Follow lipid panel and liver function tests.   

## 2023-03-21 NOTE — Assessment & Plan Note (Signed)
Left knee pain, bruising and swelling s/p recent fall.  Exam as outlined.  Discussed need for ortho evaluation.  Appt made.

## 2023-03-21 NOTE — Assessment & Plan Note (Signed)
Followed by [facility]/rheumatology.

## 2023-03-21 NOTE — Assessment & Plan Note (Signed)
On metformin.  Low carb diet and exercise.  Follow met b and a1c.   

## 2023-03-21 NOTE — Assessment & Plan Note (Signed)
Continue lipitor  ?

## 2023-03-21 NOTE — Assessment & Plan Note (Signed)
Continue metoprolol, losartan and amlodipine.  Spironolactone added during recent hospitalization. Follow pressures.  Follow metabolic panel.

## 2023-03-21 NOTE — Assessment & Plan Note (Signed)
Follow cbc.  

## 2023-03-21 NOTE — Assessment & Plan Note (Signed)
Avoid antiinflammatories.  Stay hydrated.  Follow metabolic panel.  Continue losartan.

## 2023-03-21 NOTE — Assessment & Plan Note (Signed)
Echocardiogram 11/2022 - EF 50-55% with grade III DD, right and left atrium moderately dilated with mild MR and mild AR. Continue spironolactone, metoprolol, losartan. Weight stable.  With persistent increased sob with exertion.  Plan f/u with cardiology to discuss.

## 2023-03-21 NOTE — Assessment & Plan Note (Signed)
Continue f/u with pulmonary/cardiology.

## 2023-03-21 NOTE — Progress Notes (Signed)
Rx sent in for augmentin.

## 2023-03-21 NOTE — Assessment & Plan Note (Signed)
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 Otherwise unremarkable study.  Has seen Dr Lonna Cobb.

## 2023-03-21 NOTE — Assessment & Plan Note (Signed)
Check urinalysis to confirm if infection present.

## 2023-03-21 NOTE — Assessment & Plan Note (Signed)
S/p amputation.  Followed by [facility].  ?

## 2023-03-21 NOTE — Assessment & Plan Note (Signed)
Followed by vascular surgery.  Continue compression hose.

## 2023-03-24 ENCOUNTER — Telehealth: Payer: Self-pay

## 2023-03-24 DIAGNOSIS — R9389 Abnormal findings on diagnostic imaging of other specified body structures: Secondary | ICD-10-CM

## 2023-03-24 NOTE — Addendum Note (Signed)
Addended by: Charm Barges on: 03/24/2023 09:00 PM   Modules accepted: Orders

## 2023-03-24 NOTE — Telephone Encounter (Signed)
Pt notified of xray results.  Aortic ultrasound ordered. Pt agreeable.

## 2023-03-24 NOTE — Telephone Encounter (Signed)
Lumbar xray report received from Dr Yves Dill' office. 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 Report placed in your results folder for review.

## 2023-03-25 ENCOUNTER — Ambulatory Visit: Payer: Medicare Other | Attending: Cardiology | Admitting: Cardiology

## 2023-03-25 ENCOUNTER — Telehealth: Payer: Self-pay

## 2023-03-25 ENCOUNTER — Encounter: Payer: Self-pay | Admitting: Cardiology

## 2023-03-25 VITALS — BP 106/60 | HR 64 | Ht 66.0 in | Wt 202.0 lb

## 2023-03-25 DIAGNOSIS — I2510 Atherosclerotic heart disease of native coronary artery without angina pectoris: Secondary | ICD-10-CM | POA: Diagnosis present

## 2023-03-25 DIAGNOSIS — E7800 Pure hypercholesterolemia, unspecified: Secondary | ICD-10-CM | POA: Diagnosis present

## 2023-03-25 DIAGNOSIS — R609 Edema, unspecified: Secondary | ICD-10-CM | POA: Diagnosis present

## 2023-03-25 DIAGNOSIS — R0602 Shortness of breath: Secondary | ICD-10-CM | POA: Diagnosis present

## 2023-03-25 DIAGNOSIS — I10 Essential (primary) hypertension: Secondary | ICD-10-CM | POA: Diagnosis present

## 2023-03-25 MED ORDER — TORSEMIDE 20 MG PO TABS: 20.0000 mg | ORAL_TABLET | Freq: Every day | ORAL | 0 refills | Status: DC

## 2023-03-25 NOTE — Patient Instructions (Signed)
Medication Instructions:   We are stopping the Laxis We would like you to continue the Torsemide - Tale one tablet ( 20mg ) by mouth daily.   *If you need a refill on your cardiac medications before your next appointment, please call your pharmacy*   Lab Work:  None Ordered  If you have labs (blood work) drawn today and your tests are completely normal, you will receive your results only by: MyChart Message (if you have MyChart) OR A paper copy in the mail If you have any lab test that is abnormal or we need to change your treatment, we will call you to review the results.   Testing/Procedures:  None Ordered   Follow-Up: At [facility], you and your health needs are our priority.  As part of our continuing mission to provide you with exceptional heart care, we have created designated Provider Care Teams.  These Care Teams include your primary Cardiologist (physician) and Advanced Practice Providers (APPs -  Physician Assistants and Nurse Practitioners) who all work together to provide you with the care you need, when you need it.  We recommend signing up for the patient portal called "MyChart".  Sign up information is provided on this After Visit Summary.  MyChart is used to connect with patients for Virtual Visits (Telemedicine).  Patients are able to view lab/test results, encounter notes, upcoming appointments, etc.  Non-urgent messages can be sent to your provider as well.   To learn more about what you can do with MyChart, go to [email or web address].    Your next appointment:   6 month(s)  Provider:   You may see Debbe Odea, MD or one of the following Advanced Practice Providers on your designated Care Team:   Nicolasa Ducking, NP Eula Listen, PA-C Cadence Fransico Michael, PA-C Charlsie Quest, NP Carlos Levering, NP

## 2023-03-25 NOTE — Telephone Encounter (Signed)
FYI- patient called to let me know that her visit with cardiology went good and no further w/up needed at this time. He says her SOB is because she is out of shape which she will work on. Thanked patient for the update. She will call if she needs anything,

## 2023-03-25 NOTE — Progress Notes (Signed)
Cardiology Office Note:    Date:  03/25/2023   ID:  Nina Perez, DOB 1940-10-05, MRN [id number]  PCP:  Dale Atlantic, MD   [facility] Providers Cardiologist:  Debbe Odea, MD     Referring MD: Dale Hazel Green, MD   Chief Complaint  Patient presents with   Shortness of Breath    Patient having new dyspnea on exertion.      History of Present Illness:    Nina Perez is a 82 y.o. female with a hx of CAD s/p PCI to RV marginal branch 11/2022 (70% OM1, 80% second diagonal), PAD (occluded R. anterior tibial, R. posterior tibial arteries), left bundle branch block, hypertension, hyperlipidemia, sleep apnea presenting for follow-up.  Denies chest pain, endorses shortness of breath after she overexerts herself.  Left heart cath 4 months ago showed significant stenosis of proximal RV branch, drug-eluting stent was placed.  Compliant with medications as prescribed.  Fell 1 week ago after her knees gave out on her.  Typically walks with a walker.  Prior notes/testing Echo 11/2022 EF 50 to 55%  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HCC)    a.) 2/2 scleroderma Dx   LBBB (left bundle branch block)    Long term current use of antithrombotics/antiplatelets    a.) ASA + clopidogrel   Long term current use of immunosuppressive drug    a.) MTX for RA and scleroderma Dx   OSA on CPAP    Osteoarthritis    a.) knees, spine   PAD (peripheral artery disease) (HCC)    Phlebitis    a.) x 2 (with pregnancy)   Pulmonary fibrosis (HCC)    a.) mild   Pulmonary hypertension (HCC) 02/24/2012   a.) TTE  02/24/2012: EF 55%, RVSP 33; b.) TTE 10/12/2014: EF >55%, RVSP 36.1; c.) TTE 09/02/2017: EF 55%, RVSP 45.5   Reflux esophagitis    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 b.) renal US 05/23/2020: solid LEFT renal mass measuring 1.4 x 1.5 x 1.6. 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, sclerodactyly, telangiectasias   Skin cancer 2018   Supplemental oxygen dependent    a.) 2L/[place] PRN for DOE related to mild pulmonary fibrosis and pulmonary HTN   T2DM (type 2 diabetes mellitus) (HCC)     Past Surgical History:  Procedure Laterality Date   ABDOMINAL AORTOGRAM W/LOWER EXTREMITY Left 05/16/2019   Procedure: ABDOMINAL AORTOGRAM W/LOWER EXTREMITY;  Surgeon: Runell Gess, MD;  Location: [facility];  Service: Cardiovascular;  Laterality: Left;   AMPUTATION TOE Left 06/02/2019   Procedure: AMPUTATION TOE;  Surgeon: Recardo Evangelist, DPM;  Location: [facility] ORS;  Service: Podiatry;  Laterality: Left;   APPENDECTOMY  1972  BREAST CYST ASPIRATION Left    neg   COLONOSCOPY WITH PROPOFOL N/A 09/26/2021   Procedure: COLONOSCOPY WITH PROPOFOL;  Surgeon: Jaynie Collins, DO;  Location: [facility] ENDOSCOPY;  Service: Gastroenterology;  Laterality: N/A;  DM, WHEELCHAIR, PLAVIX   CORONARY STENT INTERVENTION N/A 11/24/2022   Procedure: CORONARY STENT INTERVENTION;  Surgeon: Iran Ouch, MD;  Location: [facility] INV[facility] CV [facility];  Service: Cardiovascular;  Laterality: N/A;   ESOPHAGOGASTRODUODENOSCOPY (EGD) WITH PROPOFOL N/A 09/26/2021   Procedure: ESOPHAGOGASTRODUODENOSCOPY (EGD) WITH PROPOFOL;  Surgeon: Jaynie Collins, DO;  Location: [facility];  Service: Gastroenterology;  Laterality: N/A;    INCISION AND DRAINAGE OF WOUND Left 06/07/2019   Procedure: IRRIGATION AND DEBRIDEMENT WOUND;  Surgeon: Recardo Evangelist, DPM;  Location: [facility] ORS;  Service: Podiatry;  Laterality: Left;   KNEE ARTHROPLASTY Right 11/13/2021   Procedure: COMPUTER ASSISTED TOTAL KNEE ARTHROPLASTY;  Surgeon: Donato Heinz, MD;  Location: [facility] ORS;  Service: Orthopedics;  Laterality: Right;   LEFT HEART CATH AND CORONARY ANGIOGRAPHY Right 11/24/2022   Procedure: LEFT HEART CATH AND CORONARY ANGIOGRAPHY with possible coronary intervention;  Surgeon: Laurier Nancy, MD;  Location: [facility] CV [facility];  Service: Cardiovascular;  Laterality: Right;   LOWER EXTREMITY ANGIOGRAPHY Left 06/03/2019   Procedure: Lower Extremity Angiography;  Surgeon: Renford Dills, MD;  Location: [facility];  Service: Cardiovascular;  Laterality: Left;   LOWER EXTREMITY ANGIOGRAPHY Right 05/21/2021   Procedure: LOWER EXTREMITY ANGIOGRAPHY;  Surgeon: Renford Dills, MD;  Location: [facility];  Service: Cardiovascular;  Laterality: Right;   PERIPHERAL VASCULAR BALLOON ANGIOPLASTY Left 05/16/2019   Procedure: PERIPHERAL VASCULAR BALLOON ANGIOPLASTY;  Surgeon: Runell Gess, MD;  Location: [facility];  Service: Cardiovascular;  Laterality: Left;  SFA   TUBAL LIGATION  1972   VEIN LIGATION AND STRIPPING      Current Medications: No outpatient medications have been marked as taking for the 03/25/23 encounter (Office Visit) with Debbe Odea, MD.     Allergies:   Ethanol, Tape, Triamterene-hctz, Cephalexin, and Hydrochlorothiazide w-triamterene   Social History   Socioeconomic History   Marital status: Married    Spouse name: Nina Perez   Number of children: 2   Years of education: Not on file   Highest education level: Not on file  Occupational History   Occupation: retired    Comment: Market researcher  Tobacco Use   Smoking status: Former    Current packs/day: 0.00    Average  packs/day: 0.5 packs/day for 15.0 years (7.5 ttl pk-yrs)    Types: Cigarettes    Start date: 05/12/1973    Quit date: 05/12/1988    Years since quitting: 34.8   Smokeless tobacco: Never  Vaping Use   Vaping status: Never Used  Substance and Sexual Activity   Alcohol use: No    Alcohol/week: 0.0 standard drinks of alcohol   Drug use: No   Sexual activity: Yes  Other Topics Concern   Not on file  Social History Narrative   Lives at home with spouse Nina Perez    Social Determinants of Health   Financial Resource Strain: Low Risk  (01/15/2023)   Overall Financial Resource Strain (CARDIA)    Difficulty of Paying Living Expenses: Not hard at all  Food Insecurity: No Food Insecurity (01/15/2023)   Hunger Vital Sign    Worried About Running Out of Food in the Last Year: Never true    Ran Out of Food in the Last Year: Never true  Transportation Needs: No Transportation Needs (01/15/2023)   PRAPARE - Administrator, Civil Service (Medical): No    Lack of Transportation (Non-Medical): No  Physical Activity: Unknown (01/15/2023)   Exercise Vital Sign    Days of Exercise per Week: 7 days    Minutes of Exercise per Session: Not on file  Stress: No Stress Concern Present (01/15/2023)   [employer] of Occupational Health - Occupational Stress Questionnaire    Feeling of Stress : Not at all  Social Connections: Moderately Integrated (01/15/2023)   Social Connection and Isolation Panel [NHANES]    Frequency of Communication with Friends and Family: More than three times a week    Frequency of Social Gatherings with Friends and Family: Twice a week    Attends Religious Services: More than 4 times per year    Active Member of [employer] or Organizations: No    Attends Engineer, structural: Never    Marital Status: Married     Family History: The patient's family history includes Arthritis in her mother; Cancer in her sister; Cancer - Prostate in her brother; Heart attack in her father.  There is no history of Colon cancer or Breast cancer.  ROS:   Please see the history of present illness.     All other systems reviewed and are negative.  EKGs/Labs/Other Studies Reviewed:    The following studies were reviewed today:  EKG Interpretation Date/Time:  Wednesday March 25 2023 11:45:54 EST Ventricular Rate:  64 PR Interval:  224 QRS Duration:  106 QT Interval:  418 QTC Calculation: 431 R Axis:   -45  Text Interpretation: Sinus rhythm with 1st degree A-V block Left axis deviation Inferior infarct , age undetermined Confirmed by Debbe Odea ([id number]) on 03/25/2023 12:05:14 PM    Recent Labs: 03/02/2023: ALT 20; BUN 27; Creatinine, Ser 0.99; Hemoglobin 12.7; Platelets 166.0; Potassium 4.0; Sodium 139; TSH 4.01 03/09/2023: Magnesium 1.8  Recent Lipid Panel    Component Value Date/Time   CHOL 95 03/02/2023 1252   TRIG 125.0 03/02/2023 1252   HDL 41.20 03/02/2023 1252   CHOLHDL 2 03/02/2023 1252   VLDL 25.0 03/02/2023 1252   LDLCALC 29 03/02/2023 1252     Risk Assessment/Calculations:             Physical Exam:    VS:  BP 106/60 (BP Location: Left Arm, Patient Position: Sitting, Cuff Size: Large)   Pulse 64   Ht 5\' 6"  (1.676 m)   Wt 202 lb (91.6 kg)   LMP 05/25/1988   SpO2 95%   BMI 32.60 kg/m     Wt Readings from Last 3 Encounters:  03/25/23 202 lb (91.6 kg)  03/19/23 202 lb (91.6 kg)  03/02/23 204 lb 9.6 oz (92.8 kg)     GEN:  Well nourished, well developed in no acute distress HEENT: Normal NECK: No JVD; No carotid bruits CARDIAC: RRR, no murmurs, rubs, gallops RESPIRATORY:  Clear to auscultation without rales, wheezing or rhonchi  ABDOMEN: Soft, non-tender, non-distended MUSCULOSKELETAL:  1+ edema; varicose veins noted SKIN: Warm and dry NEUROLOGIC:  Alert and oriented x 3 PSYCHIATRIC:  Normal affect   ASSESSMENT:    1. Coronary artery disease involving native heart without angina pectoris, unspecified vessel or lesion type    2. Primary hypertension   3. Hypercholesterolemia   4. Shortness of breath   5. Dependent edema    PLAN:    In order of problems listed above:  CAD s/p PCI to  RV marginal branch 11/2022 (70% OM1, 80% second diagonal).  EF 50 to 55%.  Continue aspirin, Plavix, Lipitor 80.  Hypertension, BP controlled.  Continue Norvasc 10 mg daily, losartan 100 mg daily. Hyperlipidemia, goal LDL less than 70.  LDL at goal.  Continue Lipitor 80 mg daily. Shortness of breath from deconditioning, obese.  Activity as tolerated, continue PT at home. Dependent edema, varicose veins noted, compression stockings, keeping legs raised advised.  Okay for torsemide 20 mg daily.  Follow-up in 6 months.     Medication Adjustments/Labs and Tests Ordered: Current medicines are reviewed at length with the patient today.  Concerns regarding medicines are outlined above.  Orders Placed This Encounter  Procedures   EKG 12-Lead   Meds ordered this encounter  Medications   torsemide (DEMADEX) 20 MG tablet    Sig: Take 1 tablet (20 mg total) by mouth daily. Taking 1/2 tablet just as needed    Dispense:  90 tablet    Refill:  0    Patient Instructions  Medication Instructions:   We are stopping the Laxis We would like you to continue the Torsemide - Tale one tablet ( 20mg ) by mouth daily.   *If you need a refill on your cardiac medications before your next appointment, please call your pharmacy*   Lab Work:  None Ordered  If you have labs (blood work) drawn today and your tests are completely normal, you will receive your results only by: MyChart Message (if you have MyChart) OR A paper copy in the mail If you have any lab test that is abnormal or we need to change your treatment, we will call you to review the results.   Testing/Procedures:  None Ordered   Follow-Up: At [facility], you and your health needs are our priority.  As part of our continuing mission to provide you with  exceptional heart care, we have created designated Provider Care Teams.  These Care Teams include your primary Cardiologist (physician) and Advanced Practice Providers (APPs -  Physician Assistants and Nurse Practitioners) who all work together to provide you with the care you need, when you need it.  We recommend signing up for the patient portal called "MyChart".  Sign up information is provided on this After Visit Summary.  MyChart is used to connect with patients for Virtual Visits (Telemedicine).  Patients are able to view lab/test results, encounter notes, upcoming appointments, etc.  Non-urgent messages can be sent to your provider as well.   To learn more about what you can do with MyChart, go to [email or web address].    Your next appointment:   6 month(s)  Provider:   You may see Debbe Odea, MD or one of the following Advanced Practice Providers on your designated Care Team:   Nicolasa Ducking, NP Eula Listen, PA-C Cadence Fransico Michael, PA-C Charlsie Quest, NP Carlos Levering, NP   Signed, Debbe Odea, MD  03/25/2023 12:56 PM    [facility]

## 2023-03-31 ENCOUNTER — Other Ambulatory Visit: Payer: Self-pay | Admitting: Internal Medicine

## 2023-03-31 ENCOUNTER — Ambulatory Visit: Payer: Medicare Other

## 2023-04-03 ENCOUNTER — Telehealth: Payer: Self-pay | Admitting: Internal Medicine

## 2023-04-03 NOTE — Telephone Encounter (Signed)
FYI- called patient to check on her. She was seen at walk in yesterday and given abx for cellulitis of left knee. She has been in a lot of pain recently but did not complete therapy because her knee was hurting. Patient says that her and her husband have discussed her going back into in patient rehab so she can get therapy everyday. Patient is going call beginning of next week with an update.

## 2023-04-03 NOTE — Telephone Encounter (Signed)
melissa from [facility] called stating the pt missed a visit on 11/21 because the pt stated she was in to much pain

## 2023-04-04 NOTE — Telephone Encounter (Signed)
Noted. Holding for update.

## 2023-04-06 ENCOUNTER — Encounter: Payer: Self-pay | Admitting: Emergency Medicine

## 2023-04-06 ENCOUNTER — Inpatient Hospital Stay
Admission: EM | Admit: 2023-04-06 | Discharge: 2023-04-13 | DRG: 602 | Disposition: A | Discharge status: Skilled Nursing Facility | Payer: Medicare Other | Attending: Internal Medicine | Admitting: Internal Medicine

## 2023-04-06 ENCOUNTER — Other Ambulatory Visit: Payer: Self-pay

## 2023-04-06 ENCOUNTER — Emergency Department: Payer: Medicare Other

## 2023-04-06 ENCOUNTER — Telehealth: Payer: Self-pay | Admitting: Internal Medicine

## 2023-04-06 DIAGNOSIS — J849 Interstitial pulmonary disease, unspecified: Secondary | ICD-10-CM | POA: Diagnosis present

## 2023-04-06 DIAGNOSIS — E119 Type 2 diabetes mellitus without complications: Secondary | ICD-10-CM

## 2023-04-06 DIAGNOSIS — Z79899 Other long term (current) drug therapy: Secondary | ICD-10-CM

## 2023-04-06 DIAGNOSIS — R531 Weakness: Secondary | ICD-10-CM | POA: Diagnosis not present

## 2023-04-06 DIAGNOSIS — M25562 Pain in left knee: Secondary | ICD-10-CM | POA: Diagnosis present

## 2023-04-06 DIAGNOSIS — Z8261 Family history of arthritis: Secondary | ICD-10-CM

## 2023-04-06 DIAGNOSIS — Z89422 Acquired absence of other left toe(s): Secondary | ICD-10-CM

## 2023-04-06 DIAGNOSIS — Z8616 Personal history of COVID-19: Secondary | ICD-10-CM

## 2023-04-06 DIAGNOSIS — W19XXXA Unspecified fall, initial encounter: Secondary | ICD-10-CM | POA: Diagnosis present

## 2023-04-06 DIAGNOSIS — Z96651 Presence of right artificial knee joint: Secondary | ICD-10-CM | POA: Diagnosis present

## 2023-04-06 DIAGNOSIS — M069 Rheumatoid arthritis, unspecified: Secondary | ICD-10-CM | POA: Diagnosis present

## 2023-04-06 DIAGNOSIS — M1712 Unilateral primary osteoarthritis, left knee: Secondary | ICD-10-CM | POA: Diagnosis present

## 2023-04-06 DIAGNOSIS — Z8249 Family history of ischemic heart disease and other diseases of the circulatory system: Secondary | ICD-10-CM

## 2023-04-06 DIAGNOSIS — Z1152 Encounter for screening for COVID-19: Secondary | ICD-10-CM

## 2023-04-06 DIAGNOSIS — Z91048 Other nonmedicinal substance allergy status: Secondary | ICD-10-CM

## 2023-04-06 DIAGNOSIS — D84821 Immunodeficiency due to drugs: Secondary | ICD-10-CM | POA: Diagnosis present

## 2023-04-06 DIAGNOSIS — M479 Spondylosis, unspecified: Secondary | ICD-10-CM | POA: Diagnosis present

## 2023-04-06 DIAGNOSIS — Z860101 Personal history of adenomatous and serrated colon polyps: Secondary | ICD-10-CM

## 2023-04-06 DIAGNOSIS — I371 Nonrheumatic pulmonary valve insufficiency: Secondary | ICD-10-CM | POA: Diagnosis present

## 2023-04-06 DIAGNOSIS — I080 Rheumatic disorders of both mitral and aortic valves: Secondary | ICD-10-CM | POA: Diagnosis present

## 2023-04-06 DIAGNOSIS — I700 Atherosclerosis of aorta: Secondary | ICD-10-CM | POA: Diagnosis present

## 2023-04-06 DIAGNOSIS — Z7902 Long term (current) use of antithrombotics/antiplatelets: Secondary | ICD-10-CM

## 2023-04-06 DIAGNOSIS — Z6832 Body mass index (BMI) 32.0-32.9, adult: Secondary | ICD-10-CM

## 2023-04-06 DIAGNOSIS — I2510 Atherosclerotic heart disease of native coronary artery without angina pectoris: Secondary | ICD-10-CM | POA: Diagnosis present

## 2023-04-06 DIAGNOSIS — E7800 Pure hypercholesterolemia, unspecified: Secondary | ICD-10-CM | POA: Diagnosis present

## 2023-04-06 DIAGNOSIS — J9601 Acute respiratory failure with hypoxia: Secondary | ICD-10-CM | POA: Diagnosis present

## 2023-04-06 DIAGNOSIS — E1169 Type 2 diabetes mellitus with other specified complication: Secondary | ICD-10-CM

## 2023-04-06 DIAGNOSIS — Z7982 Long term (current) use of aspirin: Secondary | ICD-10-CM

## 2023-04-06 DIAGNOSIS — S0990XA Unspecified injury of head, initial encounter: Secondary | ICD-10-CM

## 2023-04-06 DIAGNOSIS — M0579 Rheumatoid arthritis with rheumatoid factor of multiple sites without organ or systems involvement: Secondary | ICD-10-CM | POA: Diagnosis present

## 2023-04-06 DIAGNOSIS — I252 Old myocardial infarction: Secondary | ICD-10-CM

## 2023-04-06 DIAGNOSIS — J8410 Pulmonary fibrosis, unspecified: Secondary | ICD-10-CM | POA: Diagnosis present

## 2023-04-06 DIAGNOSIS — I10 Essential (primary) hypertension: Secondary | ICD-10-CM | POA: Diagnosis present

## 2023-04-06 DIAGNOSIS — I447 Left bundle-branch block, unspecified: Secondary | ICD-10-CM | POA: Diagnosis present

## 2023-04-06 DIAGNOSIS — S80212A Abrasion, left knee, initial encounter: Secondary | ICD-10-CM | POA: Diagnosis present

## 2023-04-06 DIAGNOSIS — Z7960 Long term (current) use of unspecified immunomodulators and immunosuppressants: Secondary | ICD-10-CM

## 2023-04-06 DIAGNOSIS — M341 CR(E)ST syndrome: Secondary | ICD-10-CM | POA: Diagnosis present

## 2023-04-06 DIAGNOSIS — L03116 Cellulitis of left lower limb: Secondary | ICD-10-CM | POA: Diagnosis not present

## 2023-04-06 DIAGNOSIS — Z87891 Personal history of nicotine dependence: Secondary | ICD-10-CM

## 2023-04-06 DIAGNOSIS — Z888 Allergy status to other drugs, medicaments and biological substances status: Secondary | ICD-10-CM

## 2023-04-06 DIAGNOSIS — Z79631 Long term (current) use of antimetabolite agent: Secondary | ICD-10-CM

## 2023-04-06 DIAGNOSIS — I509 Heart failure, unspecified: Secondary | ICD-10-CM | POA: Diagnosis present

## 2023-04-06 DIAGNOSIS — I272 Pulmonary hypertension, unspecified: Secondary | ICD-10-CM | POA: Diagnosis present

## 2023-04-06 DIAGNOSIS — I130 Hypertensive heart and chronic kidney disease with heart failure and stage 1 through stage 4 chronic kidney disease, or unspecified chronic kidney disease: Secondary | ICD-10-CM | POA: Diagnosis present

## 2023-04-06 DIAGNOSIS — E872 Acidosis, unspecified: Secondary | ICD-10-CM | POA: Diagnosis present

## 2023-04-06 DIAGNOSIS — G8929 Other chronic pain: Secondary | ICD-10-CM | POA: Diagnosis present

## 2023-04-06 DIAGNOSIS — N1831 Chronic kidney disease, stage 3a: Secondary | ICD-10-CM | POA: Diagnosis present

## 2023-04-06 DIAGNOSIS — Z881 Allergy status to other antibiotic agents status: Secondary | ICD-10-CM

## 2023-04-06 DIAGNOSIS — G4733 Obstructive sleep apnea (adult) (pediatric): Secondary | ICD-10-CM | POA: Diagnosis present

## 2023-04-06 DIAGNOSIS — N179 Acute kidney failure, unspecified: Secondary | ICD-10-CM

## 2023-04-06 DIAGNOSIS — N390 Urinary tract infection, site not specified: Secondary | ICD-10-CM | POA: Diagnosis present

## 2023-04-06 DIAGNOSIS — E871 Hypo-osmolality and hyponatremia: Secondary | ICD-10-CM | POA: Insufficient documentation

## 2023-04-06 DIAGNOSIS — E669 Obesity, unspecified: Secondary | ICD-10-CM | POA: Diagnosis present

## 2023-04-06 DIAGNOSIS — Z87440 Personal history of urinary (tract) infections: Secondary | ICD-10-CM

## 2023-04-06 DIAGNOSIS — Z85828 Personal history of other malignant neoplasm of skin: Secondary | ICD-10-CM

## 2023-04-06 DIAGNOSIS — Z9981 Dependence on supplemental oxygen: Secondary | ICD-10-CM

## 2023-04-06 DIAGNOSIS — Z7984 Long term (current) use of oral hypoglycemic drugs: Secondary | ICD-10-CM

## 2023-04-06 DIAGNOSIS — E1122 Type 2 diabetes mellitus with diabetic chronic kidney disease: Secondary | ICD-10-CM | POA: Diagnosis present

## 2023-04-06 DIAGNOSIS — K2100 Gastro-esophageal reflux disease with esophagitis, without bleeding: Secondary | ICD-10-CM | POA: Diagnosis present

## 2023-04-06 DIAGNOSIS — K2270 Barrett's esophagus without dysplasia: Secondary | ICD-10-CM | POA: Diagnosis present

## 2023-04-06 DIAGNOSIS — Z9109 Other allergy status, other than to drugs and biological substances: Secondary | ICD-10-CM

## 2023-04-06 DIAGNOSIS — N189 Chronic kidney disease, unspecified: Secondary | ICD-10-CM

## 2023-04-06 DIAGNOSIS — Z8672 Personal history of thrombophlebitis: Secondary | ICD-10-CM

## 2023-04-06 DIAGNOSIS — S8002XA Contusion of left knee, initial encounter: Secondary | ICD-10-CM | POA: Diagnosis present

## 2023-04-06 LAB — URINALYSIS, ROUTINE W REFLEX MICROSCOPIC
Bilirubin Urine: NEGATIVE
Glucose, UA: NEGATIVE mg/dL
Hgb urine dipstick: NEGATIVE
Ketones, ur: NEGATIVE mg/dL
Nitrite: POSITIVE — AB
Protein, ur: 30 mg/dL — AB
Specific Gravity, Urine: 1.017 (ref 1.005–1.030)
WBC, UA: >50 WBC/hpf (ref 0–5)
pH: 7 (ref 5.0–8.0)

## 2023-04-06 LAB — BASIC METABOLIC PANEL
Anion gap: 10 (ref 5–15)
BUN: 37 mg/dL — ABNORMAL HIGH (ref 8–23)
CO2: 20 mmol/L — ABNORMAL LOW (ref 22–32)
Calcium: 9.6 mg/dL (ref 8.9–10.3)
Chloride: 103 mmol/L (ref 98–111)
Creatinine, Ser: 1.85 mg/dL — ABNORMAL HIGH (ref 0.44–1.00)
GFR, Estimated: 27 mL/min — ABNORMAL LOW (ref >60)
Glucose, Bld: 114 mg/dL — ABNORMAL HIGH (ref 70–99)
Potassium: 4.4 mmol/L (ref 3.5–5.1)
Sodium: 133 mmol/L — ABNORMAL LOW (ref 135–145)

## 2023-04-06 LAB — CBC
HCT: 38.4 % (ref 36.0–46.0)
Hemoglobin: 11.9 g/dL — ABNORMAL LOW (ref 12.0–15.0)
MCH: 32.8 pg (ref 26.0–34.0)
MCHC: 31 g/dL (ref 30.0–36.0)
MCV: 105.8 fL — ABNORMAL HIGH (ref 80.0–100.0)
Platelets: 330 10*3/uL (ref 150–400)
RBC: 3.63 MIL/uL — ABNORMAL LOW (ref 3.87–5.11)
RDW: 16.4 % — ABNORMAL HIGH (ref 11.5–15.5)
WBC: 6.4 10*3/uL (ref 4.0–10.5)
nRBC: 0.3 % — ABNORMAL HIGH (ref 0.0–0.2)

## 2023-04-06 LAB — RESP PANEL BY RT-PCR (RSV, FLU A&B, COVID)  RVPGX2
Influenza A by PCR: NEGATIVE
Influenza B by PCR: NEGATIVE
Resp Syncytial Virus by PCR: NEGATIVE
SARS Coronavirus 2 by RT PCR: NEGATIVE

## 2023-04-06 LAB — TROPONIN I (HIGH SENSITIVITY)
Troponin I (High Sensitivity): 14 ng/L (ref <18)
Troponin I (High Sensitivity): 12 ng/L (ref <18)

## 2023-04-06 LAB — TYPE AND SCREEN
ABO/RH(D): B NEG
Antibody Screen: NEGATIVE

## 2023-04-06 LAB — HEPATIC FUNCTION PANEL
ALT: 18 U/L (ref 0–44)
AST: 29 U/L (ref 15–41)
Albumin: 2.7 g/dL — ABNORMAL LOW (ref 3.5–5.0)
Alkaline Phosphatase: 83 U/L (ref 38–126)
Bilirubin, Direct: 0.4 mg/dL — ABNORMAL HIGH (ref 0.0–0.2)
Indirect Bilirubin: 0.7 mg/dL (ref 0.3–0.9)
Total Bilirubin: 1.1 mg/dL (ref <1.2)
Total Protein: 6.6 g/dL (ref 6.5–8.1)

## 2023-04-06 LAB — BRAIN NATRIURETIC PEPTIDE: B Natriuretic Peptide: 76.1 pg/mL (ref 0.0–100.0)

## 2023-04-06 LAB — CBG MONITORING, ED: Glucose-Capillary: 113 mg/dL — ABNORMAL HIGH (ref 70–99)

## 2023-04-06 LAB — FOLATE: Folate: 18.9 ng/mL (ref >5.9)

## 2023-04-06 MED ORDER — METHOTREXATE 2.5 MG PO TABS: 2.5000 mg | ORAL_TABLET | ORAL | Status: DC

## 2023-04-06 MED ORDER — AMLODIPINE BESYLATE 5 MG PO TABS: 5.0000 mg | ORAL_TABLET | Freq: Every day | ORAL | Status: DC

## 2023-04-06 MED ORDER — ASPIRIN 81 MG PO TBEC: 81.0000 mg | DELAYED_RELEASE_TABLET | Freq: Every day | ORAL | Status: DC

## 2023-04-06 MED ORDER — SODIUM CHLORIDE 0.9% FLUSH
3.0000 mL | Freq: Two times a day (BID) | INTRAVENOUS | Status: DC
  Administered 2023-04-06 – 2023-04-13 (×14): 3 mL via INTRAVENOUS

## 2023-04-06 MED ORDER — AMOXICILLIN-POT CLAVULANATE 500-125 MG PO TABS: 1.0000 | ORAL_TABLET | Freq: Two times a day (BID) | ORAL | Status: DC

## 2023-04-06 MED ORDER — ACETAMINOPHEN 325 MG PO TABS
650.0000 mg | ORAL_TABLET | Freq: Four times a day (QID) | ORAL | Status: DC | PRN
  Administered 2023-04-06 – 2023-04-08 (×5): 650 mg via ORAL
  Filled 2023-04-06 (×5): qty 2

## 2023-04-06 MED ORDER — ATORVASTATIN CALCIUM 20 MG PO TABS
40.0000 mg | ORAL_TABLET | Freq: Every day | ORAL | Status: DC
  Administered 2023-04-07 – 2023-04-13 (×7): 40 mg via ORAL
  Filled 2023-04-06 (×7): qty 2

## 2023-04-06 MED ORDER — MORPHINE SULFATE (PF) 2 MG/ML IV SOLN: 2.0000 mg | INTRAVENOUS | Status: DC | PRN

## 2023-04-06 MED ORDER — SODIUM CHLORIDE 0.9 % IV SOLN
1.0000 g | Freq: Once | INTRAVENOUS | Status: AC
  Administered 2023-04-06: 1 g via INTRAVENOUS
  Filled 2023-04-06: qty 10

## 2023-04-06 MED ORDER — SODIUM CHLORIDE 0.9 % IV BOLUS
500.0000 mL | Freq: Once | INTRAVENOUS | Status: AC
  Administered 2023-04-06: 500 mL via INTRAVENOUS

## 2023-04-06 MED ORDER — CLOPIDOGREL BISULFATE 75 MG PO TABS: 75.0000 mg | ORAL_TABLET | Freq: Every day | ORAL | Status: DC

## 2023-04-06 MED ORDER — HYDROCODONE-ACETAMINOPHEN 5-325 MG PO TABS
1.0000 | ORAL_TABLET | ORAL | Status: DC | PRN
  Administered 2023-04-08 – 2023-04-09 (×3): 1 via ORAL
  Filled 2023-04-06 (×3): qty 1

## 2023-04-06 MED ORDER — LACTATED RINGERS IV BOLUS: 250.0000 mL | Freq: Once | INTRAVENOUS | Status: DC

## 2023-04-06 MED ORDER — LOSARTAN POTASSIUM 50 MG PO TABS: 100.0000 mg | ORAL_TABLET | Freq: Every day | ORAL | Status: DC

## 2023-04-06 MED ORDER — HEPARIN SODIUM (PORCINE) 5000 UNIT/ML IJ SOLN: 5000.0000 [IU] | Freq: Three times a day (TID) | INTRAMUSCULAR | Status: DC

## 2023-04-06 MED ORDER — PANTOPRAZOLE SODIUM 40 MG IV SOLR
40.0000 mg | Freq: Two times a day (BID) | INTRAVENOUS | Status: DC
  Administered 2023-04-07 – 2023-04-09 (×7): 40 mg via INTRAVENOUS
  Filled 2023-04-06 (×7): qty 10

## 2023-04-06 MED ORDER — LACTATED RINGERS IV SOLN: INTRAVENOUS | Status: AC

## 2023-04-06 MED ORDER — METOPROLOL SUCCINATE ER 50 MG PO TB24: 100.0000 mg | ORAL_TABLET | Freq: Every day | ORAL | Status: DC

## 2023-04-06 MED ORDER — HEPARIN SODIUM (PORCINE) 5000 UNIT/ML IJ SOLN
5000.0000 [IU] | Freq: Two times a day (BID) | INTRAMUSCULAR | Status: DC
  Filled 2023-04-06: qty 1

## 2023-04-06 MED ORDER — ACETAMINOPHEN 650 MG RE SUPP: 650.0000 mg | Freq: Four times a day (QID) | RECTAL | Status: DC | PRN

## 2023-04-06 MED ORDER — METHOTREXATE SODIUM 2.5 MG PO TABS
10.0000 mg | ORAL_TABLET | ORAL | Status: DC
  Administered 2023-04-08: 10 mg via ORAL
  Filled 2023-04-06: qty 4

## 2023-04-06 MED ORDER — ALLOPURINOL 100 MG PO TABS
100.0000 mg | ORAL_TABLET | Freq: Every day | ORAL | Status: DC
  Administered 2023-04-07 – 2023-04-13 (×7): 100 mg via ORAL
  Filled 2023-04-06 (×7): qty 1

## 2023-04-06 NOTE — Telephone Encounter (Signed)
See other note for update.

## 2023-04-06 NOTE — ED Triage Notes (Signed)
Patient to ED via POV for generalized weakness. States it has been ongoing "for a while" but today unable to get up and out of a chair on her own.

## 2023-04-06 NOTE — H&P (Signed)
History and Physical    Patient: Nina Perez UJW:[id number] DOB: [birth year] DOA: 04/06/2023 DOS: the patient was seen and examined on 04/07/2023 PCP: Dale [place], MD  Patient coming from: Home  Chief Complaint:  Chief Complaint  Patient presents with   Weakness   HPI: Nina Perez is a 82 y.o. female with medical history significant for congestive heart failure, hypertension type II, GERD hypercholesterolemia, osteoarthritis allergies to ethanol, type adhesives, triamterene-HCTZ, Keflex, HCTZ came to the emergency room today with gradual onset of generalized weakness with past few days patient on antibiotic regimen for cellulitis of her left knee she was also recently treated for urinary tract infection.  Patient does not report any fevers chills nausea vomiting diarrhea no urinary symptoms.  Patient did have a fall where she struck her head.  Patient has had decrease in mobility over the past few days as well except for knee pain.  Her symptoms are improving with the doxycycline. Pt fell about a week ago and developed cellulitis and was started doxycycline.   In emergency room vitals trend shows: Vitals:   04/06/23 2330 04/06/23 2334 04/06/23 2335 04/07/23 0100  BP:  138/62  (!) 123/55  Pulse: 60   (!) 56  Temp:   98.1 F (36.7 C)   Resp: 15   13  Height:      Weight:      SpO2: 96%   96%  TempSrc:   Oral   BMI (Calculated):      Vitals are stable patient is afebrile O2 sats of 98% on room air EKG sinus rhythm left axis deviation with nonspecific ST-T wave abnormality.  Labs are notable for : Metabolic panel showing sodium 562 AKI 1.85 with a EGFR of 27.  Bicarb 20 glucose 114 LFTs added on and pending. Troponin 14. CBC shows white count, hemoglobin of 11.9, MCV of 105.8, RDW of 16.4 platelets 330 normal differential. Urinalysis positive for nitrites, leukocyte cloudy urine and more than 50 WBCs patient given Rocephin in the emergency room.  In the ED pt  received: Medications  allopurinol (ZYLOPRIM) tablet 100 mg (has no administration in time range)  atorvastatin (LIPITOR) tablet 40 mg (has no administration in time range)  sodium chloride flush (NS) 0.9 % injection 3 mL (3 mLs Intravenous Given 04/06/23 2336)  acetaminophen (TYLENOL) tablet 650 mg (650 mg Oral Given 04/06/23 2311)    Or  acetaminophen (TYLENOL) suppository 650 mg ( Rectal See Alternative 04/06/23 2311)  HYDROcodone-acetaminophen (NORCO/VICODIN) 5-325 MG per tablet 1 tablet (has no administration in time range)  morphine (PF) 2 MG/ML injection 2 mg (has no administration in time range)  lactated ringers infusion (0 mLs Intravenous Hold 04/06/23 2310)  pantoprazole (PROTONIX) injection 40 mg (40 mg Intravenous Given 04/07/23 0001)  methotrexate (RHEUMATREX) tablet 10 mg (has no administration in time range)  clopidogrel (PLAVIX) tablet 75 mg (has no administration in time range)  metoprolol succinate (TOPROL-XL) 24 hr tablet 50 mg (has no administration in time range)  nitroGLYCERIN (NITROSTAT) SL tablet 0.4 mg (has no administration in time range)  sodium chloride 0.9 % bolus 500 mL (0 mLs Intravenous Stopped 04/06/23 1951)  cefTRIAXone (ROCEPHIN) 1 g in sodium chloride 0.9 % 100 mL IVPB (0 g Intravenous Stopped 04/06/23 2233)   Review of Systems  Musculoskeletal:  Positive for falls and joint pain.   Past Medical History:  Diagnosis Date   Adenomatous colon polyp    Aortic atherosclerosis (HCC)    Barrett's esophagus  Cardiac murmur    CKD (chronic kidney disease), stage III (HCC)    Dyspnea on exertion    GERD (gastroesophageal reflux disease)    Gout    Hiatal hernia    History of 2019 novel coronavirus disease (COVID-19) 08/2020   Hypercholesterolemia    Hypertension    ILD (interstitial lung disease) (HCC)    a.) 2/2 scleroderma Dx   LBBB (left bundle branch block)    Long term current use of antithrombotics/antiplatelets    a.) ASA + clopidogrel    Long term current use of immunosuppressive drug    a.) MTX for RA and scleroderma Dx   OSA on CPAP    Osteoarthritis    a.) knees, spine   PAD (peripheral artery disease) (HCC)    Phlebitis    a.) x 2 (with pregnancy)   Pulmonary fibrosis (HCC)    a.) mild   Pulmonary hypertension (HCC) 02/24/2012   a.) TTE 02/24/2012: EF 55%, RVSP 33; b.) TTE 10/12/2014: EF >55%, RVSP 36.1; c.) TTE 09/02/2017: EF 55%, RVSP 45.5   Reflux esophagitis    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 b.) renal US 05/23/2020: solid LEFT renal mass measuring 1.4 x 1.5 x 1.6. 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, sclerodactyly, telangiectasias   Skin cancer 2018   Supplemental oxygen dependent    a.) 2L/[place] PRN for DOE related to mild pulmonary fibrosis and pulmonary HTN   T2DM (type 2 diabetes mellitus) (HCC)    Past Surgical History:  Procedure Laterality Date   ABDOMINAL AORTOGRAM W/LOWER EXTREMITY Left 05/16/2019   Procedure: ABDOMINAL AORTOGRAM W/LOWER EXTREMITY;  Surgeon: Runell Gess, MD;  Location: [facility];  Service: Cardiovascular;  Laterality: Left;   AMPUTATION TOE Left 06/02/2019   Procedure: AMPUTATION TOE;  Surgeon: Recardo Evangelist, DPM;  Location: [facility];  Service: Podiatry;  Laterality: Left;   APPENDECTOMY  1972   BREAST CYST ASPIRATION Left    neg   COLONOSCOPY WITH PROPOFOL N/A 09/26/2021   Procedure: COLONOSCOPY WITH PROPOFOL;  Surgeon: Jaynie Collins, DO;  Location: [facility] ENDOSCOPY;  Service: Gastroenterology;  Laterality: N/A;  DM, WHEELCHAIR, PLAVIX   CORONARY STENT INTERVENTION N/A 11/24/2022   Procedure: CORONARY STENT INTERVENTION;  Surgeon: Iran Ouch, MD;   Location: [facility];  Service: Cardiovascular;  Laterality: N/A;   ESOPHAGOGASTRODUODENOSCOPY (EGD) WITH PROPOFOL N/A 09/26/2021   Procedure: ESOPHAGOGASTRODUODENOSCOPY (EGD) WITH PROPOFOL;  Surgeon: Jaynie Collins, DO;  Location: [facility] ENDOSCOPY;  Service: Gastroenterology;  Laterality: N/A;   INCISION AND DRAINAGE OF WOUND Left 06/07/2019   Procedure: IRRIGATION AND DEBRIDEMENT WOUND;  Surgeon: Recardo Evangelist, DPM;  Location: [facility] OR[facility];  Service: Podiatry;  Laterality: Left;   KNEE ARTHROPLASTY Right 11/13/2021   Procedure: COMPUTER ASSISTED TOTAL KNEE ARTHROPLASTY;  Surgeon: Donato Heinz, MD;  Location: [facility] OR[facility];  Service: Orthopedics;  Laterality: Right;   LEFT HEART CATH AND CORONARY ANGIOGRAPHY Right 11/24/2022   Procedure: LEFT HEART CATH AND CORONARY ANGIOGRAPHY with possible coronary intervention;  Surgeon: Laurier Nancy, MD;  Location: [facility];  Service: Cardiovascular;  Laterality: Right;   LOWER EXTREMITY ANGIOGRAPHY Left 06/03/2019  Procedure: Lower Extremity Angiography;  Surgeon: Renford Dills, MD;  Location: [facility];  Service: Cardiovascular;  Laterality: Left;   LOWER EXTREMITY ANGIOGRAPHY Right 05/21/2021   Procedure: LOWER EXTREMITY ANGIOGRAPHY;  Surgeon: Renford Dills, MD;  Location: [facility];  Service: Cardiovascular;  Laterality: Right;   PERIPHERAL VASCULAR BALLOON ANGIOPLASTY Left 05/16/2019   Procedure: PERIPHERAL VASCULAR BALLOON ANGIOPLASTY;  Surgeon: Runell Gess, MD;  Location: [facility];  Service: Cardiovascular;  Laterality: Left;  SFA   TUBAL LIGATION  1972   VEIN LIGATION AND STRIPPING      reports that she quit smoking about 34 years ago. Her smoking use included cigarettes. She started smoking about 49 years ago. She has a 7.5 pack-year smoking history. She has never used smokeless tobacco. She reports that she does not drink alcohol and does not use drugs.  Allergies  Allergen  Reactions   Ethanol Other (See Comments)    REDNESS AND BLISTERS   Tape Other (See Comments)    Ob band aid adhesive   Pull skin off / Rash  Ob band aid adhesive  Pull skin off / Rash   Triamterene-Hctz Other (See Comments)    Weakness/fatigue   Cephalexin Rash    Blotchiness to B lower legs after several days of cephalexin for UTI   Hydrochlorothiazide W-Triamterene Other (See Comments)    Other reaction(s): Other (See Comments)  Extreme fatigue/weakness  Extreme fatigue/weakness    Other reaction(s): Other (See Comments) Extreme fatigue/weakness    Family History  Problem Relation Age of Onset   Arthritis Mother    Heart attack Father    Cancer Sister    Cancer - Prostate Brother    Colon cancer Neg Hx    Breast cancer Neg Hx     Prior to Admission medications   Medication Sig Start Date End Date Taking? Authorizing Provider  acetaminophen (TYLENOL) 650 MG CR tablet Take 650-1,300 mg by mouth every 8 (eight) hours as needed for pain.    [provider]  allopurinol (ZYLOPRIM) 100 MG tablet Take 1 tablet (100 mg total) by mouth daily. 11/14/22   Dale Jewett, MD  amLODipine (NORVASC) 5 MG tablet Take 1 tablet (5 mg total) by mouth daily. 01/21/23   Dale [place], MD  amoxicillin-clavulanate (AUGMENTIN) 500-125 MG tablet Take 1 tablet by mouth 2 (two) times daily. 03/21/23   Dale McKinnon, MD  aspirin EC 81 MG tablet Take 1 tablet (81 mg total) by mouth daily. Swallow whole. 11/26/22   Enedina Finner, MD  atorvastatin (LIPITOR) 80 MG tablet Take 1 tablet (80 mg total) by mouth daily. 11/27/22   Leeroy Bock, MD  clopidogrel (PLAVIX) 75 MG tablet Take 1 tablet (75 mg total) by mouth daily with breakfast. 01/21/23   Dale [place], MD  fexofenadine (ALLEGRA) 180 MG tablet Take 1 tablet (180 mg total) by mouth daily. Patient taking differently: Take 180 mg by mouth daily as needed for allergies. 09/10/20   Becky Augusta, NP  folic acid (FOLVITE) 1 MG tablet Take 1  mg by mouth every evening.     [provider]  glucose blood (BAYER CONTOUR TEST) test strip USE  STRIP TO CHECK GLUCOSE TWICE DAILY. Dx: E11.9 05/29/17   Dale , MD  ipratropium (ATROVENT) 0.06 % nasal spray Place 2 sprays into both nostrils 4 (four) times daily. Patient taking differently: Place 2 sprays into both nostrils 4 (four) times daily as needed for rhinitis. 09/10/20  Becky Augusta, NP  losartan (COZAAR) 100 MG tablet Take 1 tablet (100 mg total) by mouth daily. 11/14/22   Dale [place], MD  Magnesium 200 MG TABS Take by mouth. 2 tablets in the morning and 1 tablet at night    [provider]  magnesium oxide (MAG-OX) 400 MG tablet Take 1 tablet (400 mg total) by mouth 2 (two) times daily. 03/19/23   Dale [place], MD  metFORMIN (GLUCOPHAGE) 500 MG tablet Take 1 tablet by mouth once daily 03/03/23   Dale Kline, MD  methotrexate (RHEUMATREX) 2.5 MG tablet Take 2.5 mg by mouth every Wednesday. 01/10/20   [provider]  metoprolol succinate (TOPROL-XL) 100 MG 24 hr tablet Take 1 tablet (100 mg total) by mouth daily. Take with or immediately following a meal. 01/21/23   Dale [place], MD  MICROLET LANCETS MISC Check sugar once daily, [employer] Microlet Lancets. Dx E11.9 10/02/14   Dale [place], MD  nitroGLYCERIN (NITROSTAT) 0.4 MG SL tablet Place 1 tablet (0.4 mg total) under the tongue every 5 (five) minutes x 3 doses as needed for chest pain. 11/25/22   Enedina Finner, MD  pantoprazole (PROTONIX) 40 MG tablet Take 1 tablet by mouth once daily 09/26/22   Dale Kincaid, MD  spironolactone (ALDACTONE) 25 MG tablet Take 1 tablet by mouth once daily 03/31/23   Dale , MD  torsemide (DEMADEX) 20 MG tablet Take 1 tablet (20 mg total) by mouth daily. Taking 1/2 tablet just as needed 03/25/23   Debbe Odea, MD     Vitals:   04/06/23 2330 04/06/23 2334 04/06/23 2335 04/07/23 0100  BP:  138/62  (!) 123/55  Pulse: 60   (!) 56  Resp: 15   13   Temp:   98.1 F (36.7 C)   TempSrc:   Oral   SpO2: 96%   96%  Weight:      Height:       Physical Exam Vitals and nursing note reviewed.  Constitutional:      General: She is not in acute distress.    Appearance: She is obese.  HENT:     Head: Normocephalic and atraumatic.     Right Ear: Hearing and external ear normal.     Left Ear: Hearing and external ear normal.     Nose: Nose normal. No nasal deformity.     Mouth/Throat:     Lips: Pink.     Tongue: No lesions.     Pharynx: Oropharynx is clear.  Eyes:     General: Lids are normal.     Extraocular Movements: Extraocular movements intact.  Cardiovascular:     Rate and Rhythm: Normal rate and regular rhythm.     Pulses:          Dorsalis pedis pulses are 1+ on the right side and 1+ on the left side.       Posterior tibial pulses are 1+ on the right side and 1+ on the left side.     Heart sounds: Normal heart sounds.  Pulmonary:     Effort: Pulmonary effort is normal.     Breath sounds: Normal breath sounds.  Abdominal:     General: Bowel sounds are normal. There is no distension.     Palpations: Abdomen is soft. There is no mass.     Tenderness: There is no abdominal tenderness.  Musculoskeletal:     Right lower leg: No edema.     Left lower leg: No edema.  Skin:  General: Skin is warm.  Neurological:     General: No focal deficit present.     Mental Status: She is alert and oriented to person, place, and time.     Cranial Nerves: Cranial nerves 2-12 are intact.  Psychiatric:        Attention and Perception: Attention normal.        Mood and Affect: Mood normal.        Speech: Speech normal.        Behavior: Behavior normal. Behavior is cooperative.      Labs on Admission: I have personally reviewed following labs and imaging studies Results for orders placed or performed during the hospital encounter of 04/06/23 (from the past 24 hour(s))  Basic metabolic panel     Status: Abnormal   Collection Time:  04/06/23  3:14 PM  Result Value Ref Range   Sodium 133 (L) 135 - 145 mmol/L   Potassium 4.4 3.5 - 5.1 mmol/L   Chloride 103 98 - 111 mmol/L   CO2 20 (L) 22 - 32 mmol/L   Glucose, Bld 114 (H) 70 - 99 mg/dL   BUN 37 (H) 8 - 23 mg/dL   Creatinine, Ser 4.09 (H) 0.44 - 1.00 mg/dL   Calcium 9.6 8.9 - 81.1 mg/dL   GFR, Estimated 27 (L) >60 mL/min   Anion gap 10 5 - 15  CBC     Status: Abnormal   Collection Time: 04/06/23  3:14 PM  Result Value Ref Range   WBC 6.4 4.0 - 10.5 K/uL   RBC 3.63 (L) 3.87 - 5.11 MIL/uL   Hemoglobin 11.9 (L) 12.0 - 15.0 g/dL   HCT 91.4 78.2 - 95.6 %   MCV 105.8 (H) 80.0 - 100.0 fL   MCH 32.8 26.0 - 34.0 pg   MCHC 31.0 30.0 - 36.0 g/dL   RDW 21.3 (H) 08.6 - 57.8 %   Platelets 330 150 - 400 K/uL   nRBC 0.3 (H) 0.0 - 0.2 %  Brain natriuretic peptide     Status: None   Collection Time: 04/06/23  3:14 PM  Result Value Ref Range   B Natriuretic Peptide 76.1 0.0 - 100.0 pg/mL  Urinalysis, Routine w reflex microscopic -Urine, Clean Catch     Status: Abnormal   Collection Time: 04/06/23  6:26 PM  Result Value Ref Range   Color, Urine AMBER (A) YELLOW   APPearance CLOUDY (A) CLEAR   Specific Gravity, Urine 1.017 1.005 - 1.030   pH 7.0 5.0 - 8.0   Glucose, UA NEGATIVE NEGATIVE mg/dL   Hgb urine dipstick NEGATIVE NEGATIVE   Bilirubin Urine NEGATIVE NEGATIVE   Ketones, ur NEGATIVE NEGATIVE mg/dL   Protein, ur 30 (A) NEGATIVE mg/dL   Nitrite POSITIVE (A) NEGATIVE   Leukocytes,Ua MODERATE (A) NEGATIVE   RBC / HPF 0-5 0 - 5 RBC/hpf   WBC, UA >50 0 - 5 WBC/hpf   Bacteria, UA RARE (A) NONE SEEN   Squamous Epithelial / HPF 11-20 0 - 5 /HPF   Mucus PRESENT    Hyaline Casts, UA PRESENT   Resp panel by RT-PCR (RSV, Flu A&B, Covid) Anterior Nasal Swab     Status: None   Collection Time: 04/06/23  6:26 PM   Specimen: Anterior Nasal Swab  Result Value Ref Range   SARS Coronavirus 2 by RT PCR NEGATIVE NEGATIVE   Influenza A by PCR NEGATIVE NEGATIVE   Influenza B by PCR  NEGATIVE NEGATIVE   Resp Syncytial Virus by PCR  NEGATIVE NEGATIVE  Troponin I (High Sensitivity)     Status: None   Collection Time: 04/06/23  6:26 PM  Result Value Ref Range   Troponin I (High Sensitivity) 14 <18 ng/L  Hepatic function panel     Status: Abnormal   Collection Time: 04/06/23  6:26 PM  Result Value Ref Range   Total Protein 6.6 6.5 - 8.1 g/dL   Albumin 2.7 (L) 3.5 - 5.0 g/dL   AST 29 15 - 41 U/L   ALT 18 0 - 44 U/L   Alkaline Phosphatase 83 38 - 126 U/L   Total Bilirubin 1.1 <1.2 mg/dL   Bilirubin, Direct 0.4 (H) 0.0 - 0.2 mg/dL   Indirect Bilirubin 0.7 0.3 - 0.9 mg/dL  CBG monitoring, ED     Status: Abnormal   Collection Time: 04/06/23  6:44 PM  Result Value Ref Range   Glucose-Capillary 113 (H) 70 - 99 mg/dL  Troponin I (High Sensitivity)     Status: None   Collection Time: 04/06/23 10:51 PM  Result Value Ref Range   Troponin I (High Sensitivity) 12 <18 ng/L  Folate     Status: None   Collection Time: 04/06/23 10:51 PM  Result Value Ref Range   Folate 18.9 >5.9 ng/mL  Type and screen     Status: None   Collection Time: 04/06/23 10:51 PM  Result Value Ref Range   ABO/RH(D) B NEG    Antibody Screen NEG    Sample Expiration      04/09/2023,2359 Performed at [facility], [address] [place], [place] [address]   Blood gas, venous     Status: Abnormal   Collection Time: 04/07/23 12:00 AM  Result Value Ref Range   pH, Ven 7.31 7.25 - 7.43   pCO2, Ven 45 44 - 60 mmHg   pO2, Ven 43 32 - 45 mmHg   Bicarbonate 22.7 20.0 - 28.0 mmol/L   Acid-base deficit 3.7 (H) 0.0 - 2.0 mmol/L   O2 Saturation 71.4 %   Patient temperature 37.0    Collection site VEIN   Lactic acid, plasma     Status: None   Collection Time: 04/07/23 12:00 AM  Result Value Ref Range   Lactic Acid, Venous 0.7 0.5 - 1.9 mmol/L  Lactic acid, plasma     Status: None   Collection Time: 04/07/23  2:13 AM  Result Value Ref Range   Lactic Acid, Venous 0.6 0.5 - 1.9 mmol/L     CBC: Recent Labs  Lab 04/06/23 1514  WBC 6.4  HGB 11.9*  HCT 38.4  MCV 105.8*  PLT 330   Basic Metabolic Panel: Recent Labs  Lab 04/06/23 1514  NA 133*  K 4.4  CL 103  CO2 20*  GLUCOSE 114*  BUN 37*  CREATININE 1.85*  CALCIUM 9.6   CBG: Recent Labs  Lab 04/06/23 1844  GLUCAP 113*   Lipid Profile: No results for input(s): "CHOL", "HDL", "LDLCALC", "TRIG", "CHOLHDL", "LDLDIRECT" in the last 72 hours. Thyroid Function Tests: No results for input(s): "TSH", "T4TOTAL", "FREET4", "T3FREE", "THYROIDAB" in the last 72 hours. Anemia Panel: Recent Labs    04/06/23 2251  FOLATE 18.9   Urinalysis    Component Value Date/Time   COLORURINE AMBER (A) 04/06/2023 1826   APPEARANCEUR CLOUDY (A) 04/06/2023 1826   LABSPEC 1.017 04/06/2023 1826   PHURINE 7.0 04/06/2023 1826   GLUCOSEU NEGATIVE 04/06/2023 1826   GLUCOSEU NEGATIVE 03/18/2023 1408   HGBUR NEGATIVE 04/06/2023 1826   BILIRUBINUR  NEGATIVE 04/06/2023 1826   BILIRUBINUR neg 02/04/2023 1305   KETONESUR NEGATIVE 04/06/2023 1826   PROTEINUR 30 (A) 04/06/2023 1826   UROBILINOGEN 0.2 03/18/2023 1408   NITRITE POSITIVE (A) 04/06/2023 1826   LEUKOCYTESUR MODERATE (A) 04/06/2023 1826   Unresulted Labs (From admission, onward)     Start     Ordered   04/07/23 0500  Comprehensive metabolic panel  Tomorrow morning,   R        04/06/23 2234   04/07/23 0500  CBC  Tomorrow morning,   R        04/06/23 2234   04/07/23 0500  Occult blood card to lab, stool  Daily,   R      04/06/23 2246   04/07/23 0235  High sensitivity CRP  Add-on,   AD        04/07/23 0234   04/06/23 2247  Vitamin B12  Add-on,   AD        04/06/23 2246            Medications  allopurinol (ZYLOPRIM) tablet 100 mg (has no administration in time range)  atorvastatin (LIPITOR) tablet 40 mg (has no administration in time range)  sodium chloride flush (NS) 0.9 % injection 3 mL (3 mLs Intravenous Given 04/06/23 2336)  acetaminophen (TYLENOL)  tablet 650 mg (650 mg Oral Given 04/06/23 2311)    Or  acetaminophen (TYLENOL) suppository 650 mg ( Rectal See Alternative 04/06/23 2311)  HYDROcodone-acetaminophen (NORCO/VICODIN) 5-325 MG per tablet 1 tablet (has no administration in time range)  morphine (PF) 2 MG/ML injection 2 mg (has no administration in time range)  lactated ringers infusion (0 mLs Intravenous Hold 04/06/23 2310)  pantoprazole (PROTONIX) injection 40 mg (40 mg Intravenous Given 04/07/23 0001)  methotrexate (RHEUMATREX) tablet 10 mg (has no administration in time range)  clopidogrel (PLAVIX) tablet 75 mg (has no administration in time range)  metoprolol succinate (TOPROL-XL) 24 hr tablet 50 mg (has no administration in time range)  nitroGLYCERIN (NITROSTAT) SL tablet 0.4 mg (has no administration in time range)  sodium chloride 0.9 % bolus 500 mL (0 mLs Intravenous Stopped 04/06/23 1951)  cefTRIAXone (ROCEPHIN) 1 g in sodium chloride 0.9 % 100 mL IVPB (0 g Intravenous Stopped 04/06/23 2233)    Radiological Exams on Admission: US Venous Img Lower Bilateral (DVT)  Result Date: 04/07/2023 CLINICAL DATA:  Pain and swelling left lower leg. EXAM: Bilateral LOWER EXTREMITY VENOUS DOPPLER ULTRASOUND TECHNIQUE: Gray-scale sonography with compression, as well as color and duplex ultrasound, were performed to evaluate the deep venous system(s) from the level of the common femoral vein through the popliteal and proximal calf veins. COMPARISON:  Ultrasound 06/16/2018 FINDINGS: VENOUS Normal compressibility of the common femoral, superficial femoral, and popliteal veins, as well as the visualized calf veins. Visualized portions of profunda femoral vein and great saphenous vein unremarkable. No filling defects to suggest DVT on grayscale or color Doppler imaging. Doppler waveforms show normal direction of venous flow, normal respiratory plasticity and response to augmentation. Limited views of the contralateral common femoral vein are  unremarkable. OTHER Subcutaneous edema in both calves. Limitations: none IMPRESSION: Negative for acute DVT. Electronically Signed   By: Minerva Fester M.D.   On: 04/07/2023 01:28   CT Head Wo Contrast  Result Date: 04/06/2023 CLINICAL DATA:  Head trauma EXAM: CT HEAD WITHOUT CONTRAST TECHNIQUE: Contiguous axial images were obtained from the base of the skull through the vertex without intravenous contrast. RADIATION DOSE REDUCTION: This exam was performed according  to the departmental dose-optimization program which includes automated exposure control, adjustment of the mA and/or kV according to patient size and/or use of iterative reconstruction technique. COMPARISON:  None Available. FINDINGS: Brain: No evidence of acute infarction, hemorrhage, hydrocephalus, extra-axial collection or mass lesion/mass effect. There is moderate diffuse atrophy. There is mild periventricular white matter hypodensity, likely chronic small vessel ischemic change. Vascular: Atherosclerotic calcifications are present within the cavernous internal carotid arteries. Skull: Normal. Negative for fracture or focal lesion. Sinuses/Orbits: No acute finding. Other: None. IMPRESSION: 1. No acute intracranial process. 2. Moderate diffuse atrophy and mild chronic small vessel ischemic change. Electronically Signed   By: Darliss Cheney M.D.   On: 04/06/2023 19:16     Data Reviewed: Relevant notes from primary care and specialist visits, past discharge summaries as available in EHR, including Care Everywhere. Prior diagnostic testing as pertinent to current admission diagnoses Updated medications and problem lists for reconciliation ED course, including vitals, labs, imaging, treatment and response to treatment Triage notes, nursing and pharmacy notes and ED provider's notes Notable results as noted in HPI   Assessment & Plan Generalized weakness Suspect patient's generalized weakness secondary to low blood pressure, electrolyte  abnormalities.  Will monitor on telemetry for any cardiac rhythm disturbances. UTI (urinary tract infection) Urinalysis shows more than 50 WBCs and 0-5 RBC that is nitrite positive leukocyte Continue patient on Rocephin.  Patient recently had a Proteus infection.  Few weeks ago Diabetes mellitus (HCC) Glycemic protocol.  Carb consistent diet.  Hypertension Vitals:   04/06/23 1512 04/06/23 1800 04/06/23 2030 04/06/23 2100  BP: 120/67 137/73 (!) 133/57 125/64   04/06/23 2230 04/06/23 2334 04/07/23 0100  BP: 123/60 138/62 (!) 123/55  Home meds held -amlodipine 5, losartan 100, metoprolol, Aldactone, torsemide.  Suspect these meds are making her dizzy.  Making her fall and Presyncopal. Barrett's esophagus Will continue patient on PPI therapy. History of non-ST elevation myocardial infarction (NSTEMI) Chart review shows patient had a history of NSTEMI.  Troponin today is flat, BNP of 76.1, stable, no chest pain.  Will continue patient on reduced dose metoprolol, aspirin 81, atorvastatin, Plavix 75. Falls, subsequent encounter Suspect patient's fall versus syncope to be associated or caused by orthostatic changes from diuretics and blood pressure medication in combination of possible neuropathy deconditioning and external factors, or syncopal or presyncopal presentation for  pulmonary embolism will defer to a.m. team for physical therapy evaluation prior to discharge, orthostatic vitals.  Any additional measures based on MRI and other recommendations. Knee pain, bilateral Secondary to patient's fall.  Patient has severe injury.  Will obtain an MRI to make sure there is no infection or bleeding.  As needed Tylenol.  Pt also has 1+ pedal pulses and needs ABI or CTA if creatinine improves fro PAD eval.  Will follow CBC, CRP and start patient on antibiotics.    Acute respiratory failure with hypoxia (HCC) After patient moved to C pod nurse noted O2 sats dropped to 87% and started patient on 2 L  nasal cannula.  Venous blood gas ordered is within normal limits.  Viral panel today is negative for flu RSV and COVID.  Chest x-ray done today shows cardiomegaly, no acute airspace disease.  I am concerned about DVT and PE in her as she is high risk we will start with obtaining stat venous ultrasound for both lower extremities and order a VQ scan along with a 2D echocardiogram. Doppler has resulted and is negative    Prognosis: Fair  DVT prophylaxis:  Heparin  Consults:  None  Advance Care Planning:    Code Status: Full Code   Family Communication:  None  Disposition Plan:  To be determined  Severity of Illness: The appropriate patient status for this patient is INPATIENT. Inpatient status is judged to be reasonable and necessary in order to provide the required intensity of service to ensure the patient's safety. The patient's presenting symptoms, physical exam findings, and initial radiographic and laboratory data in the context of their chronic comorbidities is felt to place them at high risk for further clinical deterioration. Furthermore, it is not anticipated that the patient will be medically stable for discharge from the hospital within 2 midnights of admission.   * I certify that at the point of admission it is my clinical judgment that the patient will require inpatient hospital care spanning beyond 2 midnights from the point of admission due to high intensity of service, high risk for further deterioration and high frequency of surveillance required.*  Author: Gertha Calkin, MD 04/07/2023 2:39 AM  For on call review [email or web address].

## 2023-04-06 NOTE — Telephone Encounter (Signed)
Pt has arrived at ED

## 2023-04-06 NOTE — ED Provider Notes (Signed)
[facility] Provider Note    Event Date/Time   First MD Initiated Contact with Patient 04/06/23 1740     (approximate)   History   Weakness   HPI  HITOMI LYDEN is a 82 y.o. female with a history of CHF, hypertension, hypercholesterolemia, diabetes, GERD, pulmonary hypertension, and osteoarthritis who presents with generalized weakness over the last week, acutely worsened in the last few days, gradual onset.  The patient is being treated for cellulitis of her left knee and is on doxycycline.  She was also recently treated for UTI.  She states that the redness and pain to the knee is getting better.  She denies any fever or chills.  She has no vomiting or diarrhea.  She denies cough or shortness of breath.  She denies any urinary symptoms.  She did fall and hit her head due to the weakness.  I reviewed the past medical records.  The patient's most recent outpatient encounter was with the [facility] walk-in clinic on 11/21 for left pain, swelling, and redness.  She was diagnosed with cellulitis and started on doxycycline and tramadol.  X-rays noted possible hairline patellar fracture but no displacement.   Physical Exam   Triage Vital Signs: ED Triage Vitals  Encounter Vitals Group     BP 04/06/23 1512 120/67     Systolic BP Percentile --      Diastolic BP Percentile --      Pulse Rate 04/06/23 1512 68     Resp 04/06/23 1512 18     Temp 04/06/23 1512 98.2 F (36.8 C)     Temp Source 04/06/23 1512 Oral     SpO2 04/06/23 1512 98 %     Weight 04/06/23 1512 202 lb (91.6 kg)     Height 04/06/23 1512 5\' 6"  (1.676 m)     Head Circumference --      Peak Flow --      Pain Score 04/06/23 1511 0     Pain Loc --      Pain Education --      Exclude from Growth Chart --     Most recent vital signs: Vitals:   04/06/23 2030 04/06/23 2100  BP: (!) 133/57 125/64  Pulse: 63 65  Resp:  15  Temp:    SpO2:  93%     General: Alert and oriented, no distress.   CV:  Good peripheral perfusion.  Resp:  Normal effort.  Abd:  No distention.  Other:  Dry mucous membranes.  EOMI.  PERRLA.  Normal speech.  Motor intact in all extremities.  Left knee with approximately 10 cm area of erythema, faint induration, and slight warmth.  Healing abrasion.  Patient able to flex knee to about 120 degrees.   ED Results / Procedures / Treatments   Labs (all labs ordered are listed, but only abnormal results are displayed) Labs Reviewed  BASIC METABOLIC PANEL - Abnormal; Notable for the following components:      Result Value   Sodium 133 (*)    CO2 20 (*)    Glucose, Bld 114 (*)    BUN 37 (*)    Creatinine, Ser 1.85 (*)    GFR, Estimated 27 (*)    All other components within normal limits  CBC - Abnormal; Notable for the following components:   RBC 3.63 (*)    Hemoglobin 11.9 (*)    MCV 105.8 (*)    RDW 16.4 (*)    nRBC 0.3 (*)  All other components within normal limits  URINALYSIS, ROUTINE W REFLEX MICROSCOPIC - Abnormal; Notable for the following components:   Color, Urine AMBER (*)    APPearance CLOUDY (*)    Protein, ur 30 (*)    Nitrite POSITIVE (*)    Leukocytes,Ua MODERATE (*)    Bacteria, UA RARE (*)    All other components within normal limits  HEPATIC FUNCTION PANEL - Abnormal; Notable for the following components:   Albumin 2.7 (*)    Bilirubin, Direct 0.4 (*)    All other components within normal limits  CBG MONITORING, ED - Abnormal; Notable for the following components:   Glucose-Capillary 113 (*)    All other components within normal limits  RESP PANEL BY RT-PCR (RSV, FLU A&B, COVID)  RVPGX2  TROPONIN I (HIGH SENSITIVITY)  TROPONIN I (HIGH SENSITIVITY)     EKG  ED ECG REPORT I, Dionne Bucy, the attending physician, personally viewed and interpreted this ECG.  Date: 04/06/2023 EKG Time: 1514 Rate: 70 Rhythm: normal sinus rhythm QRS Axis: Left axis Intervals: Nonspecific IVCD ST/T Wave abnormalities:  Nonspecific ST abnormalities Narrative Interpretation: no evidence of acute ischemia    RADIOLOGY  CT head: I independently viewed and interpreted the images; there is no ICH.  Radiology report indicates no acute abnormality.   PROCEDURES:  Critical Care performed: No  Procedures   MEDICATIONS ORDERED IN ED: Medications  allopurinol (ZYLOPRIM) tablet 100 mg (has no administration in time range)  amLODipine (NORVASC) tablet 5 mg (has no administration in time range)  aspirin EC tablet 81 mg (has no administration in time range)  atorvastatin (LIPITOR) tablet 80 mg (has no administration in time range)  clopidogrel (PLAVIX) tablet 75 mg (has no administration in time range)  amoxicillin-clavulanate (AUGMENTIN) 500-125 MG per tablet 1 tablet (has no administration in time range)  losartan (COZAAR) tablet 100 mg (has no administration in time range)  metoprolol succinate (TOPROL-XL) 24 hr tablet 100 mg (has no administration in time range)  methotrexate (RHEUMATREX) tablet 2.5 mg (has no administration in time range)  sodium chloride 0.9 % bolus 500 mL (0 mLs Intravenous Stopped 04/06/23 1951)  cefTRIAXone (ROCEPHIN) 1 g in sodium chloride 0.9 % 100 mL IVPB (1 g Intravenous New Bag/Given 04/06/23 2130)     IMPRESSION / MDM / ASSESSMENT AND PLAN / ED COURSE  I reviewed the triage vital signs and the nursing notes.  82 year old female with PMH as noted above presents with gradual onset of generalized weakness over the last several days.  She is being treated for cellulitis over her left knee and has not been getting up or moving around much in the last few days.  Differential diagnosis includes, but is not limited to, deconditioning related to her near injury, dehydration, electrolyte abnormality, AKI, other metabolic cause, viral syndrome, UTI, other infection, less likely cardiac cause.  There is no evidence of septic arthritis.  The patient has good range of motion of the knee and  the symptom are improving with doxycycline.  We will obtain CT head due to the fall and head trauma, lab workup, give fluids, and reassess.  Patient's presentation is most consistent with acute presentation with potential threat to life or bodily function.  The patient is on the cardiac monitor to evaluate for evidence of arrhythmia and/or significant heart rate changes.   ----------------------------------------- 10:25 PM on 04/06/2023 -----------------------------------------  CT head is negative.  Lab workup is overall reassuring.  BMP shows no significant AKI.  Respiratory panel  is negative.  CBC shows no leukocytosis or anemia.  However urinalysis does demonstrate findings consistent with UTI.  I have ordered IV ceftriaxone after review of the patient's recent urine cultures.  The patient will need admission for further workup and monitoring given her weakness.  I consulted Dr. Allena Katz from the hospitalist service; based on our discussion she agrees to evaluate the patient for admission.    FINAL CLINICAL IMPRESSION(S) / ED DIAGNOSES   Final diagnoses:  Generalized weakness  Urinary tract infection without hematuria, site unspecified  Minor head injury, initial encounter     Rx / DC Orders   ED Discharge Orders     None        Note:  This document was prepared using Dragon voice recognition software and may include unintentional dictation errors.    Dionne Bucy, MD 04/06/23 2226

## 2023-04-06 NOTE — ED Notes (Signed)
SpO2 noted to drop when pt sleeping; Pt wears supplemental oxygen at home as needed; Pt placed on 2L [place] and SpO2 improved to 97%

## 2023-04-06 NOTE — Telephone Encounter (Signed)
Called and talked with husband. He was wanting to bring patient in and see if you could do a possible direct admission to [facility] for patient because she has had several falls, infection in her knee and her energy level is so low she can barely get around the house. She is eating and drinking but says her legs are just so weak she feels like they cannot hold her up. Advised husband no work in appts today and given the symptoms she is having - needs to be evaluated at the ED. Husband also mentioned that they are interested in [facility] going back [facility] for [facility] rehab but insurance will only pay if she is admitted to the hospital for 3 days. He is supposed to be taking her over to ED this afternoon.

## 2023-04-06 NOTE — Telephone Encounter (Signed)
Patient's husband called and would like a call back. He states patient is eating and drinking, but her energy level is so low that she is having a hard time getting around.

## 2023-04-06 NOTE — Hospital Course (Addendum)
82 year old female past medical history hypertension, pulmonary fibrosis, pulmonary hypertension, rheumatoid arthritis, scleroderma with crest syndrome, type 2 diabetes mellitus, hyperlipidemia, CKD.  She presents with weakness and falls.  Slight burning on urination.  Started on Rocephin for erythema bilateral knees left worse than right. He had a bilateral MRI of the knees, showed a large subcutaneous hematoma in the left knee.  Seen by orthopedic surgery, does not seem to involve the knee joints, may have some secondary cellulitis.  Recommended oral antibiotics. Patient is also evaluated by PT/OT, recommending nursing home placement. Patient also has evidence of urinary tract infection, started on Rocephin.  Condition has improved, leg edema still present, no redness anymore.  Medically stable for discharge.

## 2023-04-06 NOTE — H&P (Incomplete)
History and Physical    Patient: Nina Perez BJY:[id number] DOB: 1941/05/12 DOA: 04/06/2023 DOS: the patient was seen and examined on 04/06/2023 PCP: Dale , MD  Patient coming from: Home  Chief Complaint:  Chief Complaint  Patient presents with  . Weakness   HPI: Nina Perez is a 82 y.o. female with medical history significant for congestive heart failure, hypertension type II, GERD hypercholesterolemia, osteoarthritis allergies to ethanol, type adhesives, triamterene-HCTZ, Keflex, HCTZ came to the emergency room today with gradual onset of generalized weakness with past few days patient on antibiotic regimen for cellulitis of her left knee she was also recently treated for urinary tract infection.  Patient does not report any fevers chills nausea vomiting diarrhea no urinary symptoms.  Patient did have a fall where she struck her head.  Patient has had decrease in mobility over the past few days as well except for knee pain.  Her symptoms are improving with the doxycycline. Pt fell about a week ago and developed cellulitis and was started doxycycline.   In emergency room vitals trend shows: Vitals:   04/06/23 1512 04/06/23 1800 04/06/23 2030 04/06/23 2100  BP: 120/67 137/73 (!) 133/57 125/64  Pulse: 68 64 63 65  Temp: 98.2 F (36.8 C) 98.2 F (36.8 C)    Resp: 18 18  15   Height: 5\' 6"  (1.676 m)     Weight: 91.6 kg     SpO2: 98% 96%  93%  TempSrc: Oral Oral    BMI (Calculated): 32.62     Vitals are stable patient is afebrile O2 sats of 98% on room air EKG sinus rhythm left axis deviation with nonspecific ST-T wave abnormality.  Labs are notable for : Metabolic panel showing sodium 086 AKI 1.85 with a EGFR of 27.  Bicarb 20 glucose 114 LFTs added on and pending. Troponin 14. CBC shows white count, hemoglobin of 11.9, MCV of 105.8, RDW of 16.4 platelets 330 normal differential. Urinalysis positive for nitrites, leukocyte cloudy urine and more than 50 WBCs patient given  Rocephin in the emergency room.  In the ED pt received: Medications  cefTRIAXone (ROCEPHIN) 1 g in sodium chloride 0.9 % 100 mL IVPB (1 g Intravenous New Bag/Given 04/06/23 2130)  sodium chloride 0.9 % bolus 500 mL (0 mLs Intravenous Stopped 04/06/23 1951)   Review of Systems  Musculoskeletal:  Positive for falls and joint pain.   Past Medical History:  Diagnosis Date  . Adenomatous colon polyp   . Aortic atherosclerosis (HCC)   . Barrett's esophagus   . Cardiac murmur   . CKD (chronic kidney disease), stage III (HCC)   . Dyspnea on exertion   . GERD (gastroesophageal reflux disease)   . Gout   . Hiatal hernia   . History of 2019 novel coronavirus disease (COVID-19) 08/2020  . Hypercholesterolemia   . Hypertension   . ILD (interstitial lung disease) (HCC)    a.) 2/2 scleroderma Dx  . LBBB (left bundle branch block)   . Long term current use of antithrombotics/antiplatelets    a.) ASA + clopidogrel  . Long term current use of immunosuppressive drug    a.) MTX for RA and scleroderma Dx  . OSA on CPAP   . Osteoarthritis    a.) knees, spine  . PAD (peripheral artery disease) (HCC)   . Phlebitis    a.) x 2 (with pregnancy)  . Pulmonary fibrosis (HCC)    a.) mild  . Pulmonary hypertension (HCC) 02/24/2012   a.) TTE  02/24/2012: EF 55%, RVSP 33; b.) TTE 10/12/2014: EF >55%, RVSP 36.1; c.) TTE 09/02/2017: EF 55%, RVSP 45.5  . Reflux esophagitis   . 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 b.) renal US 05/23/2020: solid LEFT renal mass measuring 1.4 x 1.5 x 1.6. c.) MRI abd 12/04/2020: approx 1.2 cm fat signal lesion c/w benign angiolipoma  . Rheumatoid arthritis    a.) positive  RF, FANA, RNP; negative CCP Ab and anti DNA; neg anti-SCL 70  . 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 Skin cancer 2018  . Supplemental oxygen dependent     a.) 2L/[place] PRN for DOE related to mild pulmonary fibrosis and pulmonary HTN  . T2DM (type 2 diabetes mellitus) (HCC)    Past Surgical History:  Procedure Laterality Date  . ABDOMINAL AORTOGRAM W/LOWER EXTREMITY Left 05/16/2019   Procedure: ABDOMINAL AORTOGRAM W/LOWER EXTREMITY;  Surgeon: Runell Gess, MD;  Location: [facility] INVASIVE CV LAB;  Service: Cardiovascular;  Laterality: Left;  . AMPUTATION TOE Left 06/02/2019   Procedure: AMPUTATION TOE;  Surgeon: Recardo Evangelist, DPM;  Location: [facility] ORS;  Service: Podiatry;  Laterality: Left;  . APPENDECTOMY  1972  . BREAST CYST ASPIRATION Left    neg  . COLONOSCOPY WITH PROPOFOL N/A 09/26/2021   Procedure: COLONOSCOPY WITH PROPOFOL;  Surgeon: Jaynie Collins, DO;  Location: [facility] ENDOSCOPY;  Service: Gastroenterology;  Laterality: N/A;  DM, WHEELCHAIR, PLAVIX  . CORONARY STENT INTERVENTION N/A 11/24/2022   Procedure: CORONARY STENT INTERVENTION;  Surgeon: Iran Ouch, MD;  Location: [facility] INVASIVE CV LAB;  Service: Cardiovascular;  Laterality: N/A;  . ESOPHAGOGASTRODUODENOSCOPY (EGD) WITH PROPOFOL N/A 09/26/2021   Procedure: ESOPHAGOGASTRODUODENOSCOPY (EGD) WITH PROPOFOL;  Surgeon: Jaynie Collins, DO;  Location: [facility] ENDOSCOPY;  Service: Gastroenterology;  Laterality: N/A;  . INCISION AND DRAINAGE OF WOUND Left 06/07/2019   Procedure: IRRIGATION AND DEBRIDEMENT WOUND;  Surgeon: Recardo Evangelist, DPM;  Location: [facility] ORS;  Service: Podiatry;  Laterality: Left;  . KNEE ARTHROPLASTY Right 11/13/2021   Procedure: COMPUTER ASSISTED TOTAL KNEE ARTHROPLASTY;  Surgeon: Donato Heinz, MD;  Location: [facility] ORS;  Service: Orthopedics;  Laterality: Right;  . LEFT HEART CATH AND CORONARY ANGIOGRAPHY Right 11/24/2022   Procedure: LEFT HEART CATH AND CORONARY ANGIOGRAPHY with possible coronary intervention;  Surgeon: Laurier Nancy, MD;  Location: [facility] INVASIVE CV LAB;  Service: Cardiovascular;  Laterality: Right;  . LOWER EXTREMITY ANGIOGRAPHY  Left 06/03/2019   Procedure: Lower Extremity Angiography;  Surgeon: Renford Dills, MD;  Location: [facility] INVASIVE CV LAB;  Service: Cardiovascular;  Laterality: Left;  . LOWER EXTREMITY ANGIOGRAPHY Right 05/21/2021   Procedure: LOWER EXTREMITY ANGIOGRAPHY;  Surgeon: Renford Dills, MD;  Location: [facility] INVASIVE CV LAB;  Service: Cardiovascular;  Laterality: Right;  . PERIPHERAL VASCULAR BALLOON ANGIOPLASTY Left 05/16/2019   Procedure: PERIPHERAL VASCULAR BALLOON ANGIOPLASTY;  Surgeon: Runell Gess, MD;  Location: [facility] INVASIVE CV LAB;  Service: Cardiovascular;  Laterality: Left;  SFA  . TUBAL LIGATION  1972  . VEIN LIGATION AND STRIPPING      reports that she quit smoking about 34 years ago. Her smoking use included cigarettes. She started smoking about 49 years ago. She has a 7.5 pack-year smoking history. She has never used smokeless tobacco. She reports that she does not drink alcohol and does not use drugs.  Allergies  Allergen Reactions  . Ethanol Other (See Comments)  REDNESS AND BLISTERS  . Tape Other (See Comments)    Ob band aid adhesive   Pull skin off / Rash  Ob band aid adhesive  Pull skin off / Rash  . Triamterene-Hctz Other (See Comments)    Weakness/fatigue  . Cephalexin Rash    Blotchiness to B lower legs after several days of cephalexin for UTI  . Hydrochlorothiazide W-Triamterene Other (See Comments)    Other reaction(s): Other (See Comments)  Extreme fatigue/weakness  Extreme fatigue/weakness    Other reaction(s): Other (See Comments) Extreme fatigue/weakness    Family History  Problem Relation Age of Onset  . Arthritis Mother   . Heart attack Father   . Cancer Sister   . Cancer - Prostate Brother   . Colon cancer Neg Hx   . Breast cancer Neg Hx     Prior to Admission medications   Medication Sig Start Date End Date Taking? Authorizing Provider  acetaminophen (TYLENOL) 650 MG CR tablet Take 650-1,300 mg by mouth every 8 (eight) hours as  needed for pain.    [provider]  allopurinol (ZYLOPRIM) 100 MG tablet Take 1 tablet (100 mg total) by mouth daily. 11/14/22   Dale [place], MD  amLODipine (NORVASC) 5 MG tablet Take 1 tablet (5 mg total) by mouth daily. 01/21/23   Dale McKenney, MD  amoxicillin-clavulanate (AUGMENTIN) 500-125 MG tablet Take 1 tablet by mouth 2 (two) times daily. 03/21/23   Dale [place], MD  aspirin EC 81 MG tablet Take 1 tablet (81 mg total) by mouth daily. Swallow whole. 11/26/22   Enedina Finner, MD  atorvastatin (LIPITOR) 80 MG tablet Take 1 tablet (80 mg total) by mouth daily. 11/27/22   Leeroy Bock, MD  clopidogrel (PLAVIX) 75 MG tablet Take 1 tablet (75 mg total) by mouth daily with breakfast. 01/21/23   Dale [place], MD  fexofenadine (ALLEGRA) 180 MG tablet Take 1 tablet (180 mg total) by mouth daily. Patient taking differently: Take 180 mg by mouth daily as needed for allergies. 09/10/20   Becky Augusta, NP  folic acid (FOLVITE) 1 MG tablet Take 1 mg by mouth every evening.     [provider]  glucose blood (BAYER CONTOUR TEST) test strip USE  STRIP TO CHECK GLUCOSE TWICE DAILY. Dx: E11.9 05/29/17   Dale [place], MD  ipratropium (ATROVENT) 0.06 % nasal spray Place 2 sprays into both nostrils 4 (four) times daily. Patient taking differently: Place 2 sprays into both nostrils 4 (four) times daily as needed for rhinitis. 09/10/20   Becky [place], NP  losartan (COZAAR) 100 MG tablet Take 1 tablet (100 mg total) by mouth daily. 11/14/22   Dale K[place], MD  Magnesium 200 MG TABS Take by mouth. 2 tablets in the morning and 1 tablet at night    [provider]  magnesium oxide (MAG-OX) 400 MG tablet Take 1 tablet (400 mg total) by mouth 2 (two) times daily. 03/19/23   Dale [place], MD  metFORMIN (GLUCOPHAGE) 500 MG tablet Take 1 tablet by mouth once daily 03/03/23   Dale [place], MD  methotrexate (RHEUMATREX) 2.5 MG tablet Take 2.5 mg by mouth every Wednesday. 01/10/20    [provider]  metoprolol succinate (TOPROL-XL) 100 MG 24 hr tablet Take 1 tablet (100 mg total) by mouth daily. Take with or immediately following a meal. 01/21/23   Dale [place], MD  MICROLET LANCETS MISC Check sugar once daily, [employer] Microlet Lancets. Dx E11.9 10/02/14   Dale [place], MD  nitroGLYCERIN (NITROSTAT) 0.4  MG SL tablet Place 1 tablet (0.4 mg total) under the tongue every 5 (five) minutes x 3 doses as needed for chest pain. 11/25/22   Enedina Finner, MD  pantoprazole (PROTONIX) 40 MG tablet Take 1 tablet by mouth once daily 09/26/22   Dale [place], MD  spironolactone (ALDACTONE) 25 MG tablet Take 1 tablet by mouth once daily 03/31/23   Dale [place], MD  torsemide (DEMADEX) 20 MG tablet Take 1 tablet (20 mg total) by mouth daily. Taking 1/2 tablet just as needed 03/25/23   Debbe Odea, MD     Vitals:   04/06/23 1512 04/06/23 1800 04/06/23 2030 04/06/23 2100  BP: 120/67 137/73 (!) 133/57 125/64  Pulse: 68 64 63 65  Resp: 18 18  15   Temp: 98.2 F (36.8 C) 98.2 F (36.8 C)    TempSrc: Oral Oral    SpO2: 98% 96%  93%  Weight: 91.6 kg     Height: 5\' 6"  (1.676 m)      Physical Exam   Labs on Admission: I have personally reviewed following labs and imaging studies Results for orders placed or performed during the hospital encounter of 04/06/23 (from the past 24 hour(s))  Basic metabolic panel     Status: Abnormal   Collection Time: 04/06/23  3:14 PM  Result Value Ref Range   Sodium 133 (L) 135 - 145 mmol/L   Potassium 4.4 3.5 - 5.1 mmol/L   Chloride 103 98 - 111 mmol/L   CO2 20 (L) 22 - 32 mmol/L   Glucose, Bld 114 (H) 70 - 99 mg/dL   BUN 37 (H) 8 - 23 mg/dL   Creatinine, Ser 9.60 (H) 0.44 - 1.00 mg/dL   Calcium 9.6 8.9 - 45.4 mg/dL   GFR, Estimated 27 (L) >60 mL/min   Anion gap 10 5 - 15  CBC     Status: Abnormal   Collection Time: 04/06/23  3:14 PM  Result Value Ref Range   WBC 6.4 4.0 - 10.5 K/uL   RBC 3.63 (L) 3.87 - 5.11 MIL/uL    Hemoglobin 11.9 (L) 12.0 - 15.0 g/dL   HCT 09.8 11.9 - 14.7 %   MCV 105.8 (H) 80.0 - 100.0 fL   MCH 32.8 26.0 - 34.0 pg   MCHC 31.0 30.0 - 36.0 g/dL   RDW 82.9 (H) 56.2 - 13.0 %   Platelets 330 150 - 400 K/uL   nRBC 0.3 (H) 0.0 - 0.2 %  Urinalysis, Routine w reflex microscopic -Urine, Clean Catch     Status: Abnormal   Collection Time: 04/06/23  6:26 PM  Result Value Ref Range   Color, Urine AMBER (A) YELLOW   APPearance CLOUDY (A) CLEAR   Specific Gravity, Urine 1.017 1.005 - 1.030   pH 7.0 5.0 - 8.0   Glucose, UA NEGATIVE NEGATIVE mg/dL   Hgb urine dipstick NEGATIVE NEGATIVE   Bilirubin Urine NEGATIVE NEGATIVE   Ketones, ur NEGATIVE NEGATIVE mg/dL   Protein, ur 30 (A) NEGATIVE mg/dL   Nitrite POSITIVE (A) NEGATIVE   Leukocytes,Ua MODERATE (A) NEGATIVE   RBC / HPF 0-5 0 - 5 RBC/hpf   WBC, UA >50 0 - 5 WBC/hpf   Bacteria, UA RARE (A) NONE SEEN   Squamous Epithelial / HPF 11-20 0 - 5 /HPF   Mucus PRESENT    Hyaline Casts, UA PRESENT   Resp panel by RT-PCR (RSV, Flu A&B, Covid) Anterior Nasal Swab     Status: None   Collection Time: 04/06/23  6:26  PM   Specimen: Anterior Nasal Swab  Result Value Ref Range   SARS Coronavirus 2 by RT PCR NEGATIVE NEGATIVE   Influenza A by PCR NEGATIVE NEGATIVE   Influenza B by PCR NEGATIVE NEGATIVE   Resp Syncytial Virus by PCR NEGATIVE NEGATIVE  Troponin I (High Sensitivity)     Status: None   Collection Time: 04/06/23  6:26 PM  Result Value Ref Range   Troponin I (High Sensitivity) 14 <18 ng/L  CBG monitoring, ED     Status: Abnormal   Collection Time: 04/06/23  6:44 PM  Result Value Ref Range   Glucose-Capillary 113 (H) 70 - 99 mg/dL    CBC: Recent Labs  Lab 04/06/23 1514  WBC 6.4  HGB 11.9*  HCT 38.4  MCV 105.8*  PLT 330   Basic Metabolic Panel: Recent Labs  Lab 04/06/23 1514  NA 133*  K 4.4  CL 103  CO2 20*  GLUCOSE 114*  BUN 37*  CREATININE 1.85*  CALCIUM 9.6   CBG: Recent Labs  Lab 04/06/23 1844  GLUCAP 113*    Lipid Profile: No results for input(s): "CHOL", "HDL", "LDLCALC", "TRIG", "CHOLHDL", "LDLDIRECT" in the last 72 hours. Thyroid Function Tests: No results for input(s): "TSH", "T4TOTAL", "FREET4", "T3FREE", "THYROIDAB" in the last 72 hours. Anemia Panel: No results for input(s): "VITAMINB12", "FOLATE", "FERRITIN", "TIBC", "IRON", "RETICCTPCT" in the last 72 hours. Urinalysis    Component Value Date/Time   COLORURINE AMBER (A) 04/06/2023 1826   APPEARANCEUR CLOUDY (A) 04/06/2023 1826   LABSPEC 1.017 04/06/2023 1826   PHURINE 7.0 04/06/2023 1826   GLUCOSEU NEGATIVE 04/06/2023 1826   GLUCOSEU NEGATIVE 03/18/2023 1408   HGBUR NEGATIVE 04/06/2023 1826   BILIRUBINUR NEGATIVE 04/06/2023 1826   BILIRUBINUR neg 02/04/2023 1305   KETONESUR NEGATIVE 04/06/2023 1826   PROTEINUR 30 (A) 04/06/2023 1826   UROBILINOGEN 0.2 03/18/2023 1408   NITRITE POSITIVE (A) 04/06/2023 1826   LEUKOCYTESUR MODERATE (A) 04/06/2023 1826   Unresulted Labs (From admission, onward)    None       Medications  cefTRIAXone (ROCEPHIN) 1 g in sodium chloride 0.9 % 100 mL IVPB (1 g Intravenous New Bag/Given 04/06/23 2130)  sodium chloride 0.9 % bolus 500 mL (0 mLs Intravenous Stopped 04/06/23 1951)    Radiological Exams on Admission: CT Head Wo Contrast  Result Date: 04/06/2023 CLINICAL DATA:  Head trauma EXAM: CT HEAD WITHOUT CONTRAST TECHNIQUE: Contiguous axial images were obtained from the base of the skull through the vertex without intravenous contrast. RADIATION DOSE REDUCTION: This exam was performed according to the departmental dose-optimization program which includes automated exposure control, adjustment of the mA and/or kV according to patient size and/or use of iterative reconstruction technique. COMPARISON:  None Available. FINDINGS: Brain: No evidence of acute infarction, hemorrhage, hydrocephalus, extra-axial collection or mass lesion/mass effect. There is moderate diffuse atrophy. There is mild  periventricular white matter hypodensity, likely chronic small vessel ischemic change. Vascular: Atherosclerotic calcifications are present within the cavernous internal carotid arteries. Skull: Normal. Negative for fracture or focal lesion. Sinuses/Orbits: No acute finding. Other: None. IMPRESSION: 1. No acute intracranial process. 2. Moderate diffuse atrophy and mild chronic small vessel ischemic change. Electronically Signed   By: Darliss Cheney M.D.   On: 04/06/2023 19:16     Data Reviewed: Relevant notes from primary care and specialist visits, past discharge summaries as available in EHR, including Care Everywhere. Prior diagnostic testing as pertinent to current admission diagnoses Updated medications and problem lists for reconciliation ED course, including  vitals, labs, imaging, treatment and response to treatment Triage notes, nursing and pharmacy notes and ED provider's notes Notable results as noted in HPI  Assessment and Plan: No notes have been filed under this hospital service. Service: Hospitalist    Assessment & Plan Diabetes mellitus (HCC)  Hypertension  Rheumatoid arthritis involving multiple sites with positive rheumatoid factor (HCC)  Barrett's esophagus  UTI (urinary tract infection)      Prognosis: ***  DVT prophylaxis:  ***  Consults:  ***  Advance Care Planning:    Code Status: Prior   Family Communication:  ***  Disposition Plan:  ***  Severity of Illness: {Observation/Inpatient:21159}  Author: Gertha Calkin, MD 04/06/2023 9:48 PM  For on call review [email or web address].

## 2023-04-06 NOTE — Telephone Encounter (Signed)
Agree with ER evaluation given symptoms.

## 2023-04-06 NOTE — ED Notes (Addendum)
This NT assisted pt on and off of bed pan. Pt is now cleaned up and resting comfortably in bed.

## 2023-04-07 ENCOUNTER — Observation Stay: Payer: Medicare Other

## 2023-04-07 ENCOUNTER — Encounter: Payer: Self-pay | Admitting: Radiology

## 2023-04-07 ENCOUNTER — Observation Stay (HOSPITAL_COMMUNITY)
Admit: 2023-04-07 | Discharge: 2023-04-07 | Disposition: A | Discharge status: Home / Self Care | Payer: Medicare Other | Attending: Internal Medicine | Admitting: Internal Medicine

## 2023-04-07 DIAGNOSIS — E1169 Type 2 diabetes mellitus with other specified complication: Secondary | ICD-10-CM

## 2023-04-07 DIAGNOSIS — I272 Pulmonary hypertension, unspecified: Secondary | ICD-10-CM | POA: Diagnosis present

## 2023-04-07 DIAGNOSIS — N179 Acute kidney failure, unspecified: Secondary | ICD-10-CM | POA: Diagnosis not present

## 2023-04-07 DIAGNOSIS — N3000 Acute cystitis without hematuria: Secondary | ICD-10-CM | POA: Diagnosis not present

## 2023-04-07 DIAGNOSIS — L03116 Cellulitis of left lower limb: Secondary | ICD-10-CM | POA: Diagnosis present

## 2023-04-07 DIAGNOSIS — I252 Old myocardial infarction: Secondary | ICD-10-CM

## 2023-04-07 DIAGNOSIS — W19XXXA Unspecified fall, initial encounter: Secondary | ICD-10-CM | POA: Diagnosis present

## 2023-04-07 DIAGNOSIS — N3001 Acute cystitis with hematuria: Secondary | ICD-10-CM

## 2023-04-07 DIAGNOSIS — E785 Hyperlipidemia, unspecified: Secondary | ICD-10-CM

## 2023-04-07 DIAGNOSIS — K2270 Barrett's esophagus without dysplasia: Secondary | ICD-10-CM

## 2023-04-07 DIAGNOSIS — R531 Weakness: Secondary | ICD-10-CM | POA: Diagnosis present

## 2023-04-07 DIAGNOSIS — J9601 Acute respiratory failure with hypoxia: Secondary | ICD-10-CM | POA: Diagnosis present

## 2023-04-07 DIAGNOSIS — R55 Syncope and collapse: Secondary | ICD-10-CM | POA: Diagnosis not present

## 2023-04-07 DIAGNOSIS — I10 Essential (primary) hypertension: Secondary | ICD-10-CM

## 2023-04-07 DIAGNOSIS — M069 Rheumatoid arthritis, unspecified: Secondary | ICD-10-CM | POA: Diagnosis present

## 2023-04-07 DIAGNOSIS — N390 Urinary tract infection, site not specified: Secondary | ICD-10-CM | POA: Diagnosis present

## 2023-04-07 DIAGNOSIS — I700 Atherosclerosis of aorta: Secondary | ICD-10-CM | POA: Diagnosis present

## 2023-04-07 DIAGNOSIS — D84821 Immunodeficiency due to drugs: Secondary | ICD-10-CM | POA: Diagnosis present

## 2023-04-07 DIAGNOSIS — E871 Hypo-osmolality and hyponatremia: Secondary | ICD-10-CM | POA: Diagnosis present

## 2023-04-07 DIAGNOSIS — M341 CR(E)ST syndrome: Secondary | ICD-10-CM | POA: Diagnosis present

## 2023-04-07 DIAGNOSIS — N189 Chronic kidney disease, unspecified: Secondary | ICD-10-CM | POA: Diagnosis not present

## 2023-04-07 DIAGNOSIS — I080 Rheumatic disorders of both mitral and aortic valves: Secondary | ICD-10-CM | POA: Diagnosis present

## 2023-04-07 DIAGNOSIS — I509 Heart failure, unspecified: Secondary | ICD-10-CM | POA: Diagnosis present

## 2023-04-07 DIAGNOSIS — E7800 Pure hypercholesterolemia, unspecified: Secondary | ICD-10-CM | POA: Diagnosis present

## 2023-04-07 DIAGNOSIS — I130 Hypertensive heart and chronic kidney disease with heart failure and stage 1 through stage 4 chronic kidney disease, or unspecified chronic kidney disease: Secondary | ICD-10-CM | POA: Diagnosis present

## 2023-04-07 DIAGNOSIS — E669 Obesity, unspecified: Secondary | ICD-10-CM

## 2023-04-07 DIAGNOSIS — Z8616 Personal history of COVID-19: Secondary | ICD-10-CM | POA: Diagnosis not present

## 2023-04-07 DIAGNOSIS — J849 Interstitial pulmonary disease, unspecified: Secondary | ICD-10-CM | POA: Diagnosis present

## 2023-04-07 DIAGNOSIS — E1122 Type 2 diabetes mellitus with diabetic chronic kidney disease: Secondary | ICD-10-CM | POA: Diagnosis present

## 2023-04-07 DIAGNOSIS — Z1152 Encounter for screening for COVID-19: Secondary | ICD-10-CM | POA: Diagnosis not present

## 2023-04-07 DIAGNOSIS — J8410 Pulmonary fibrosis, unspecified: Secondary | ICD-10-CM | POA: Diagnosis present

## 2023-04-07 DIAGNOSIS — E872 Acidosis, unspecified: Secondary | ICD-10-CM | POA: Diagnosis present

## 2023-04-07 DIAGNOSIS — N1831 Chronic kidney disease, stage 3a: Secondary | ICD-10-CM | POA: Diagnosis present

## 2023-04-07 DIAGNOSIS — S8002XA Contusion of left knee, initial encounter: Secondary | ICD-10-CM | POA: Diagnosis present

## 2023-04-07 DIAGNOSIS — I2510 Atherosclerotic heart disease of native coronary artery without angina pectoris: Secondary | ICD-10-CM | POA: Diagnosis not present

## 2023-04-07 LAB — COMPREHENSIVE METABOLIC PANEL
ALT: 14 U/L (ref 0–44)
AST: 19 U/L (ref 15–41)
Albumin: 2.4 g/dL — ABNORMAL LOW (ref 3.5–5.0)
Alkaline Phosphatase: 76 U/L (ref 38–126)
Anion gap: 9 (ref 5–15)
BUN: 42 mg/dL — ABNORMAL HIGH (ref 8–23)
CO2: 21 mmol/L — ABNORMAL LOW (ref 22–32)
Calcium: 9.2 mg/dL (ref 8.9–10.3)
Chloride: 106 mmol/L (ref 98–111)
Creatinine, Ser: 1.65 mg/dL — ABNORMAL HIGH (ref 0.44–1.00)
GFR, Estimated: 31 mL/min — ABNORMAL LOW (ref >60)
Glucose, Bld: 86 mg/dL (ref 70–99)
Potassium: 4.4 mmol/L (ref 3.5–5.1)
Sodium: 136 mmol/L (ref 135–145)
Total Bilirubin: 0.8 mg/dL (ref <1.2)
Total Protein: 5.8 g/dL — ABNORMAL LOW (ref 6.5–8.1)

## 2023-04-07 LAB — MAGNESIUM: Magnesium: 1.4 mg/dL — ABNORMAL LOW (ref 1.7–2.4)

## 2023-04-07 LAB — PHOSPHORUS: Phosphorus: 3.1 mg/dL (ref 2.5–4.6)

## 2023-04-07 LAB — URINALYSIS, W/ REFLEX TO CULTURE (INFECTION SUSPECTED)
Bilirubin Urine: NEGATIVE
Glucose, UA: NEGATIVE mg/dL
Ketones, ur: NEGATIVE mg/dL
Nitrite: NEGATIVE
Protein, ur: NEGATIVE mg/dL
Specific Gravity, Urine: 1.01 (ref 1.005–1.030)
WBC, UA: >50 WBC/hpf (ref 0–5)
pH: 5 (ref 5.0–8.0)

## 2023-04-07 LAB — ECHOCARDIOGRAM COMPLETE BUBBLE STUDY
AR max vel: 2.09 cm2
AV Area VTI: 2.03 cm2
AV Area mean vel: 2.07 cm2
AV Mean grad: 8 mm[Hg]
AV Peak grad: 15.7 mm[Hg]
Ao pk vel: 1.98 m/s
Area-P 1/2: 2.86 cm2
MV VTI: 2.42 cm2
S' Lateral: 2.9 cm

## 2023-04-07 LAB — CBC
HCT: 32.5 % — ABNORMAL LOW (ref 36.0–46.0)
Hemoglobin: 10.4 g/dL — ABNORMAL LOW (ref 12.0–15.0)
MCH: 32.5 pg (ref 26.0–34.0)
MCHC: 32 g/dL (ref 30.0–36.0)
MCV: 101.6 fL — ABNORMAL HIGH (ref 80.0–100.0)
Platelets: 204 10*3/uL (ref 150–400)
RBC: 3.2 MIL/uL — ABNORMAL LOW (ref 3.87–5.11)
RDW: 16 % — ABNORMAL HIGH (ref 11.5–15.5)
WBC: 4.2 10*3/uL (ref 4.0–10.5)
nRBC: 0 % (ref 0.0–0.2)

## 2023-04-07 LAB — VITAMIN B12: Vitamin B-12: 556 pg/mL (ref 180–914)

## 2023-04-07 LAB — BLOOD GAS, VENOUS
Acid-base deficit: 3.7 mmol/L — ABNORMAL HIGH (ref 0.0–2.0)
Bicarbonate: 22.7 mmol/L (ref 20.0–28.0)
O2 Saturation: 71.4 %
Patient temperature: 37
pCO2, Ven: 45 mm[Hg] (ref 44–60)
pH, Ven: 7.31 (ref 7.25–7.43)
pO2, Ven: 43 mm[Hg] (ref 32–45)

## 2023-04-07 LAB — LACTIC ACID, PLASMA
Lactic Acid, Venous: 0.6 mmol/L (ref 0.5–1.9)
Lactic Acid, Venous: 0.7 mmol/L (ref 0.5–1.9)

## 2023-04-07 MED ORDER — SODIUM CHLORIDE 0.9 % IV SOLN
1.0000 g | INTRAVENOUS | Status: DC
  Administered 2023-04-07 – 2023-04-08 (×2): 1 g via INTRAVENOUS
  Filled 2023-04-07 (×3): qty 10

## 2023-04-07 MED ORDER — TECHNETIUM TO 99M ALBUMIN AGGREGATED
4.3800 | Freq: Once | INTRAVENOUS | Status: AC | PRN
  Administered 2023-04-07: 4.38 via INTRAVENOUS

## 2023-04-07 MED ORDER — METOPROLOL SUCCINATE ER 50 MG PO TB24
50.0000 mg | ORAL_TABLET | Freq: Every day | ORAL | Status: DC
  Administered 2023-04-07: 50 mg via ORAL
  Filled 2023-04-07: qty 1

## 2023-04-07 MED ORDER — CLOPIDOGREL BISULFATE 75 MG PO TABS
75.0000 mg | ORAL_TABLET | Freq: Every day | ORAL | Status: DC
  Administered 2023-04-07 – 2023-04-13 (×7): 75 mg via ORAL
  Filled 2023-04-07 (×7): qty 1

## 2023-04-07 MED ORDER — HEPARIN SODIUM (PORCINE) 5000 UNIT/ML IJ SOLN
5000.0000 [IU] | Freq: Two times a day (BID) | INTRAMUSCULAR | Status: DC
  Administered 2023-04-07 – 2023-04-13 (×13): 5000 [IU] via SUBCUTANEOUS
  Filled 2023-04-07 (×13): qty 1

## 2023-04-07 MED ORDER — NITROGLYCERIN 0.4 MG SL SUBL: 0.4000 mg | SUBLINGUAL_TABLET | SUBLINGUAL | Status: DC | PRN

## 2023-04-07 MED ORDER — MAGNESIUM SULFATE 2 GM/50ML IV SOLN
2.0000 g | Freq: Once | INTRAVENOUS | Status: AC
  Administered 2023-04-07: 2 g via INTRAVENOUS
  Filled 2023-04-07: qty 50

## 2023-04-07 MED ORDER — MUPIROCIN CALCIUM 2 % EX CREA
TOPICAL_CREAM | Freq: Two times a day (BID) | CUTANEOUS | Status: DC
  Administered 2023-04-12: 1 via TOPICAL
  Filled 2023-04-07 (×2): qty 15

## 2023-04-07 NOTE — Assessment & Plan Note (Signed)
Secondary to fall and opening up of the skin.  Erythema today less than admission picture.  Will get orthopedic evaluation.  Continue Rocephin for now.

## 2023-04-07 NOTE — Assessment & Plan Note (Signed)
Ordered urine culture.  Empirically on Rocephin

## 2023-04-07 NOTE — Progress Notes (Signed)
Progress Note   Patient: Nina Perez AVW:[id number] DOB: [birth year] DOA: 04/06/2023     0 DOS: the patient was seen and examined on 04/07/2023   Brief hospital course: 82 year old female past medical history hypertension, pulmonary fibrosis, pulmonary hypertension, rheumatoid arthritis, scleroderma with crest syndrome, type 2 diabetes mellitus, hyperlipidemia, CKD.  She presents with weakness and falls.  Slight burning on urination.  Started on Rocephin for erythema bilateral knees left worse than right.  11/26.  Admitting physician ordered a VQ scan which was negative.  Chest x-ray negative.  Continue antibiotics for knee infection and UTI.  Urine culture ordered.  Will get orthopedic consultation.  PT evaluation.  Assessment and Plan: * Cellulitis of knee, left Secondary to fall and opening up of the skin.  Erythema today less than admission picture.  Will get orthopedic evaluation.  Continue Rocephin for now.  Acute respiratory failure with hypoxia (HCC) Pulse ox of 85% on room air.  Continue oxygen supplementation.  VQ scan and x-ray negative.  Acute kidney injury superimposed on CKD (HCC) Acute kidney injury on CKD stage IIIa.  Creatinine 1.85 on presentation down to 1.65.  Continue holding medications that can affect kidney function.  IV fluids today.  UTI (urinary tract infection) Ordered urine culture.  Empirically on Rocephin  Type 2 diabetes mellitus with hyperlipidemia (HCC) Last hemoglobin A1c 6.3 patient on sliding scale insulin.  Continue Lipitor.  Generalized weakness PT, OT evaluation.  Hypertension Holding medications that can affect kidney function.  On Toprol right now.  History of non-ST elevation myocardial infarction (NSTEMI) Will continue patient on reduced dose metoprolol, aspirin 81, atorvastatin, Plavix 75.  Rheumatoid arthritis (HCC) Patient with scleroderma with crest syndrome also.  On methotrexate  Barrett's esophagus Continue  Protonix  Hypomagnesemia 2 g IV magnesium given  Obesity (BMI 30-39.9) BMI 32.60 with current height and weight in computer.        Subjective: Patient coming in with generalized weakness and falls.  Had a fall on her knee a couple weeks ago and opened it up and had some bleeding and some bruising.  Now has erythema.  Having pain on her left knee.  Can hardly put weight down on it.  Physical Exam: Vitals:   04/07/23 0100 04/07/23 0600 04/07/23 1000 04/07/23 1022  BP: (!) 123/55 (!) 105/52 136/82   Pulse: (!) 56 (!) 55 70   Resp: 13 12 13    Temp:  97.7 F (36.5 C)  98 F (36.7 C)  TempSrc:  Oral  Oral  SpO2: 96% 100% 98%   Weight:      Height:       Physical Exam HENT:     Head: Normocephalic.     Mouth/Throat:     Pharynx: No oropharyngeal exudate.  Eyes:     General: Lids are normal.     Conjunctiva/sclera: Conjunctivae normal.  Cardiovascular:     Rate and Rhythm: Normal rate and regular rhythm.     Heart sounds: Normal heart sounds, S1 normal and S2 normal.  Pulmonary:     Breath sounds: No decreased breath sounds, wheezing, rhonchi or rales.  Abdominal:     Palpations: Abdomen is soft.     Tenderness: There is no abdominal tenderness.  Musculoskeletal:     Left knee: Swelling and effusion present. Decreased range of motion.     Right lower leg: Swelling present.     Left lower leg: Swelling present.  Skin:    General: Skin is warm.  Comments: Bilateral knee erythema improved since yesterday's picture.  2 dark scabs on left knee.  Neurological:     Mental Status: She is alert and oriented to person, place, and time.     Data Reviewed: Creatinine 1.65, magnesium 1.4, albumin 2.4, hemoglobin A1c 6.3, hemoglobin 10.4  Family Communication: Husband at bedside  Disposition: Status is: Observation Will get PT and OT evaluations since patient having pain on her knee with ambulation prior to coming in.  Will also get orthopedic evaluation.  Planned  Discharge Destination: To be determined    Time spent: 28 minutes  Author: Alford Highland, MD 04/07/2023 1:39 PM  For on call review [email or web address].

## 2023-04-07 NOTE — Assessment & Plan Note (Signed)
After patient moved to C pod nurse noted O2 sats dropped to 87% and started patient on 2 L nasal cannula.  Venous blood gas ordered is within normal limits.  Viral panel today is negative for flu RSV and COVID.  Chest x-ray done today shows cardiomegaly, no acute airspace disease.  I am concerned about DVT and PE in her as she is high risk we will start with obtaining stat venous ultrasound for both lower extremities and order a VQ scan along with a 2D echocardiogram. Doppler has resulted and is negative

## 2023-04-07 NOTE — Assessment & Plan Note (Signed)
Vitals:   04/06/23 1512 04/06/23 1800 04/06/23 2030 04/06/23 2100  BP: 120/67 137/73 (!) 133/57 125/64   04/06/23 2230 04/06/23 2334 04/07/23 0100  BP: 123/60 138/62 (!) 123/55  Home meds held -amlodipine 5, losartan 100, metoprolol, Aldactone, torsemide.  Suspect these meds are making her dizzy.  Making her fall and Presyncopal.

## 2023-04-07 NOTE — Evaluation (Signed)
Occupational Therapy Evaluation Patient Details Name: Nina Perez MRN: [id number] DOB: Jan 06, 1941 Today's Date: 04/07/2023   History of Present Illness Nina Perez is a 82 y.o. female with medical history significant for congestive heart failure, hypertension type II, GERD hypercholesterolemia, osteoarthritis allergies to ethanol, type adhesives, triamterene-HCTZ, Keflex, HCTZ came to the emergency room today with gradual onset of generalized weakness with past few days patient on antibiotic regimen for cellulitis of her left knee she was also recently treated for urinary tract infection. Chest x-ray negative. Will get orthopedic consultation.   Clinical Impression   Pt was seen for OT evaluation this date. Prior to hospital admission, pt lives with her husband in a multi-level home with ramp to enter. Reports MOD I with use of RW for mobility and ADLs MOD I using grab bars, shower seat, etc. Recent falls d/t progressive weakness and receiving HH therapy at home.   Pt presents to acute OT demonstrating impaired ADL performance and functional mobility 2/2 pain, weakness, balance deficits and limited activity tolerance (See OT problem list for additional functional deficits). Pt currently requires Max A to Max A x2 for bed mobility tasks d/t pain. Need for cueing and Min to Mod A for rolling in bed d/t BM in bedpan. Total assist for hygiene after BM. Max A for forward scoot to EOB, unable to lateral scoot d/t pain in L knee. Deferred standing while awaiting ortho consult for any WB precautions. Pt sat at EOB ~8 mins to perform oral care and face washing with SUP prior to return to bed. Max A to don socks seated at EOB. Pt would benefit from skilled OT services to address noted impairments and functional limitations (see below for any additional details) in order to maximize safety and independence while minimizing falls risk and caregiver burden. Do anticipate the need for follow up OT services upon  acute hospital DC.        If plan is discharge home, recommend the following: A lot of help with walking and/or transfers;A lot of help with bathing/dressing/bathroom;Assistance with cooking/housework;Help with stairs or ramp for entrance;Assist for transportation    Functional Status Assessment  Patient has had a recent decline in their functional status and demonstrates the ability to make significant improvements in function in a reasonable and predictable amount of time.  Equipment Recommendations  Other (comment) (defer)    Recommendations for Other Services       Precautions / Restrictions Precautions Precautions: Fall Restrictions Other Position/Activity Restrictions: pending ortho consult      Mobility Bed Mobility Overal bed mobility: Needs Assistance Bed Mobility: Rolling, Supine to Sit, Sit to Supine Rolling: Mod assist, Min assist (Mod progressing to Min A with repetition)   Supine to sit: Max assist, Used rails Sit to supine: Max assist, +2 for physical assistance   General bed mobility comments: Max A x1 supine to sit at EOB with cueing and use of rails; needed Max A to scoot to EOB/forward, slow and hesitant movements d/t increased L knee pain; Max A x2 for trunk and BLE management to return to bed with Max A x2 for scooting to HOB; ~8 mins seated EOB with SUP for seated ADLs    Transfers                   General transfer comment: deferred this date; pending ortho consult      Balance Overall balance assessment: Needs assistance Sitting-balance support: Feet supported, No upper extremity supported Sitting balance-Leahy  Scale: Good Sitting balance - Comments: no LOB noted at EOB for seated ADLs                                   ADL either performed or assessed with clinical judgement   ADL Overall ADL's : Needs assistance/impaired     Grooming: Wash/dry face;Wash/dry hands;Supervision/safety;Sitting               Lower  Body Dressing: Maximal assistance;Bed level Lower Body Dressing Details (indicate cue type and reason): Max A for OT to don socks but reports she only wears slip on shoes at home               General ADL Comments: suspect would need increased assistance with LB ADLs d/t severe L knee pain at this time-Max A     Vision         Perception         Praxis         Pertinent Vitals/Pain Pain Assessment Pain Assessment: 0-10 Pain Score: 8  Pain Location: L Knee Pain Descriptors / Indicators: Aching, Tender, Sore Pain Intervention(s): Monitored during session, Limited activity within patient's tolerance, RN gave pain meds during session     Extremity/Trunk Assessment Upper Extremity Assessment Upper Extremity Assessment: Generalized weakness   Lower Extremity Assessment Lower Extremity Assessment: Generalized weakness       Communication Communication Communication: No apparent difficulties   Cognition Arousal: Alert Behavior During Therapy: WFL for tasks assessed/performed Overall Cognitive Status: Within Functional Limits for tasks assessed                                       General Comments  L knee with edema and erythema, tenderness to touch    Exercises     Shoulder Instructions      Home Living Family/patient expects to be discharged to:: Private residence Living Arrangements: Spouse/significant other Available Help at Discharge: Family Type of Home: House Home Access: Ramped entrance     Home Layout: Two level;Able to live on main level with bedroom/bathroom     Bathroom Shower/Tub: Producer, television/film/video: Standard     Home Equipment: Agricultural consultant (2 wheels);Rollator (4 wheels);Shower seat   Additional Comments: Lives on main level; was ambulating with RW prior to admission without assistance. Endorses two falls, with progressive weakness leading up to hospital admission      Prior Functioning/Environment  Prior Level of Function : Independent/Modified Independent             Mobility Comments: Mod I with RW PTA ADLs Comments: Mod I with ADLs; 2 recent falls; was getting HH therapy        OT Problem List: Decreased strength;Impaired balance (sitting and/or standing);Pain;Increased edema;Decreased activity tolerance      OT Treatment/Interventions: Self-care/ADL training;Therapeutic exercise;Patient/family education;Balance training;Energy conservation;Therapeutic activities;DME and/or AE instruction    OT Goals(Current goals can be found in the care plan section) Acute Rehab OT Goals Patient Stated Goal: improve strength and pain OT Goal Formulation: With patient Time For Goal Achievement: 04/21/23 Potential to Achieve Goals: Good ADL Goals Pt Will Perform Upper Body Bathing: with set-up;sitting Pt Will Perform Lower Body Bathing: with contact guard assist;sitting/lateral leans;sit to/from stand Pt Will Perform Lower Body Dressing: with contact guard assist;sit to/from stand;sitting/lateral leans;with adaptive  equipment Pt Will Transfer to Toilet: with min assist;ambulating;bedside commode Pt Will Perform Toileting - Clothing Manipulation and hygiene: with contact guard assist;sitting/lateral leans;sit to/from stand Additional ADL Goal #1: Pt will perform bed mobility with Min A, verb cues and good safety to promote return to PLOF.  OT Frequency: Min 1X/week    Co-evaluation PT/OT/SLP Co-Evaluation/Treatment: Yes Reason for Co-Treatment: For patient/therapist safety;To address functional/ADL transfers PT goals addressed during session: Mobility/safety with mobility OT goals addressed during session: ADL's and self-care      AM-PAC OT "6 Clicks" Daily Activity     Outcome Measure Help from another person eating meals?: None Help from another person taking care of personal grooming?: None Help from another person toileting, which includes using toliet, bedpan, or urinal?: A  Lot Help from another person bathing (including washing, rinsing, drying)?: A Lot Help from another person to put on and taking off regular upper body clothing?: A Little Help from another person to put on and taking off regular lower body clothing?: A Lot 6 Click Score: 17   End of Session Nurse Communication: Mobility status  Activity Tolerance: Patient tolerated treatment well;Patient limited by pain Patient left: in bed;with call bell/phone within reach;with bed alarm set;with family/visitor present  OT Visit Diagnosis: Unsteadiness on feet (R26.81);Other abnormalities of gait and mobility (R26.89);Muscle weakness (generalized) (M62.81)                Time: 0109-3235 OT Time Calculation (min): 29 min Charges:  OT General Charges $OT Visit: 1 Visit OT Evaluation $OT Eval Low Complexity: 1 Low Ikesha Siller, OTR/L 04/07/23, 3:42 PM  Roderic Lammert E Toleen Lachapelle 04/07/2023, 3:39 PM

## 2023-04-07 NOTE — Assessment & Plan Note (Signed)
Last hemoglobin A1c 6.3 patient on sliding scale insulin.  Continue Lipitor.

## 2023-04-07 NOTE — Assessment & Plan Note (Signed)
PT , OT evaluation

## 2023-04-07 NOTE — Progress Notes (Signed)
   04/07/23 1549  Vitals  BP (!) 152/71   Notified Dr. Hilton Sinclair

## 2023-04-07 NOTE — Progress Notes (Signed)
Physical Therapy Evaluation Patient Details Name: Nina Perez MRN: [id number] DOB: [birth year] Today's Date: 04/07/2023  History of Present Illness  Nina Perez is a 82 y.o. female with medical history significant for congestive heart failure, hypertension type II, GERD hypercholesterolemia, osteoarthritis allergies to ethanol, type adhesives, triamterene-HCTZ, Keflex, HCTZ came to the emergency room today with gradual onset of generalized weakness with past few days patient on antibiotic regimen for cellulitis of her left knee she was also recently treated for urinary tract infection. Chest x-ray negative. Will get orthopedic consultation.   Clinical Impression  Patient admitted with above diagnosis. Patient supine in bed with family members at bedside, agreeable to PT services. At baseline, patient ambulating with RW household distances. Husband provides intermittent assist for IADLs. Patient presenting with significant pain in L knee, erythema and edema noted. RN provided pain medication during session. Patient currently require Mod A to roll with use of bed rails, and Max A to Max A +2 with bed mobility due to pain/weakness. Deferred further transfers/ambulation trial this date due to pain, and pending ortho consult per chart review. Patient left supine in bed with all needs in reach, bed alarm set, and family remains at bedside. Patient will benefit from skilled acute PT services to address functional impairments (see below for additional) and maximize functional mobility. Anticipate the need for follow up PT services upon acute hospital discharge. Will continue to follow acutely        If plan is discharge home, recommend the following: A lot of help with walking and/or transfers;A lot of help with bathing/dressing/bathroom;Assist for transportation;Help with stairs or ramp for entrance   Can travel by private vehicle   No    Equipment Recommendations Other (comment) (TBD at next level of  care)  Recommendations for Other Services       Functional Status Assessment Patient has had a recent decline in their functional status and demonstrates the ability to make significant improvements in function in a reasonable and predictable amount of time.     Precautions / Restrictions Precautions Precautions: Fall Restrictions Other Position/Activity Restrictions: pending ortho consult      Mobility  Bed Mobility Overal bed mobility: Needs Assistance Bed Mobility: Rolling, Supine to Sit, Sit to Supine Rolling: Mod assist, Min assist (Mod A initially progressing to Min A with increased reps.)   Supine to sit: Max assist Sit to supine: Max assist, +2 for physical assistance   General bed mobility comments: Max A to get from supine > sit with use of rails; increased assistance at trunk. Require assist to scoot to EOB, slow movements due ot apin in L Knee. Sit > Supine require Max A +2. Patient able to sit EOB for ~ 8 minutes to complete ADLs. Close supervisoin throughout.    Transfers                   General transfer comment: deferred this date; pending ortho consult    Ambulation/Gait               General Gait Details: deferred this date; pending ortho consult  Stairs            Wheelchair Mobility     Tilt Bed    Modified Rankin (Stroke Patients Only)       Balance Overall balance assessment: Needs assistance Sitting-balance support: Feet supported, No upper extremity supported Sitting balance-Leahy Scale: Good Sitting balance - Comments: no overt LOB seated EOB, able to complete ADLs  seated without assistance for balance.                                     Pertinent Vitals/Pain Pain Assessment Pain Assessment: 0-10 Pain Score: 8  Pain Location: L Knee Pain Descriptors / Indicators: Aching, Tender, Sore Pain Intervention(s): Limited activity within patient's tolerance, Monitored during session, RN gave pain meds  during session    Home Living Family/patient expects to be discharged to:: Private residence Living Arrangements: Spouse/significant other Available Help at Discharge: Family Type of Home: House Home Access: Ramped entrance       Home Layout: Two level;Able to live on main level with bedroom/bathroom Home Equipment: Rolling Walker (2 wheels);Rollator (4 wheels);Shower seat Additional Comments: Lives on main level; was ambulating with RW prior to admission without assistance. Endorses two falls, with progressive weakness leading up to hospital admission    Prior Function Prior Level of Function : Independent/Modified Independent             Mobility Comments: Mod I with RW PTA ADLs Comments: Mod I with ADLs     Extremity/Trunk Assessment   Upper Extremity Assessment Upper Extremity Assessment: Defer to OT evaluation    Lower Extremity Assessment Lower Extremity Assessment: Generalized weakness       Communication   Communication Communication: No apparent difficulties  Cognition Arousal: Alert Behavior During Therapy: WFL for tasks assessed/performed Overall Cognitive Status: Within Functional Limits for tasks assessed                                          General Comments General comments (skin integrity, edema, etc.): significant erythema and edema noted on L knee, tenderness to touch.    Exercises     Assessment/Plan    PT Assessment Patient needs continued PT services  PT Problem List Decreased strength;Decreased range of motion;Decreased activity tolerance;Decreased balance;Decreased mobility       PT Treatment Interventions DME instruction;Gait training;Functional mobility training;Therapeutic activities;Balance training;Neuromuscular re-education;Therapeutic exercise    PT Goals (Current goals can be found in the Care Plan section)  Acute Rehab PT Goals Patient Stated Goal: Get Rid of Pain in Knee PT Goal Formulation: With  patient/family Time For Goal Achievement: 04/21/23 Potential to Achieve Goals: Good    Frequency Min 1X/week     Co-evaluation PT/OT/SLP Co-Evaluation/Treatment: Yes Reason for Co-Treatment: For patient/therapist safety;To address functional/ADL transfers PT goals addressed during session: Mobility/safety with mobility OT goals addressed during session: ADL's and self-care       AM-PAC PT "6 Clicks" Mobility  Outcome Measure Help needed turning from your back to your side while in a flat bed without using bedrails?: A Little Help needed moving from lying on your back to sitting on the side of a flat bed without using bedrails?: A Lot Help needed moving to and from a bed to a chair (including a wheelchair)?: A Lot Help needed standing up from a chair using your arms (e.g., wheelchair or bedside chair)?: A Lot Help needed to walk in hospital room?: A Lot Help needed climbing 3-5 steps with a railing? : A Lot 6 Click Score: 13    End of Session   Activity Tolerance: Patient limited by pain Patient left: in bed;with bed alarm set;with call bell/phone within reach;with family/visitor present Nurse Communication: Mobility status PT  Visit Diagnosis: Unsteadiness on feet (R26.81);Other abnormalities of gait and mobility (R26.89);History of falling (Z91.81);Muscle weakness (generalized) (M62.81);Difficulty in walking, not elsewhere classified (R26.2)    Time: 7846-9629 PT Time Calculation (min) (ACUTE ONLY): 30 min   Charges:   PT Evaluation $PT Eval Moderate Complexity: 1 Mod PT Treatments $Therapeutic Activity: 8-22 mins PT General Charges $$ ACUTE PT VISIT: 1 Visit         Howie Ill, PT, DPT 04/07/23 3:16 PM

## 2023-04-07 NOTE — Assessment & Plan Note (Signed)
Pulse ox of 85% on room air.  Continue oxygen supplementation.  VQ scan and x-ray negative.

## 2023-04-07 NOTE — Assessment & Plan Note (Signed)
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  Creatinine 1.85 on presentation down to 1.65.  Continue holding medications that can affect kidney function.  IV fluids today.

## 2023-04-07 NOTE — Assessment & Plan Note (Signed)
Suspect patient's fall versus syncope to be associated or caused by orthostatic changes from diuretics and blood pressure medication in combination of possible neuropathy deconditioning and external factors, or syncopal or presyncopal presentation for  pulmonary embolism will defer to a.m. team for physical therapy evaluation prior to discharge, orthostatic vitals.  Any additional measures based on MRI and other recommendations.

## 2023-04-07 NOTE — Assessment & Plan Note (Signed)
Holding medications that can affect kidney function.  On Toprol right now.

## 2023-04-07 NOTE — Progress Notes (Signed)
*  PRELIMINARY RESULTS* Echocardiogram 2D Echocardiogram has been performed.  Nina Perez 04/07/2023, 1:13 PM

## 2023-04-07 NOTE — Consult Note (Signed)
ORTHOPAEDIC CONSULTATION  REQUESTING PHYSICIAN: Alford Highland, MD  Chief Complaint:   Left knee abrasion with swelling and erythema.  History of Present Illness: Nina Perez is a 82 y.o. female with multiple medical problems including chronic kidney disease, gastroesophageal reflux disease, gout, hypercholesterolemia, hypertension, interstitial lung disease, coronary artery disease, obstructive sleep apnea, type 2 diabetes, and rheumatoid arthritis who was admitted yesterday complaining of progressive generalized weakness over the past few days.  The patient has a history of chronic bilateral knee pain and is status post a right total knee arthroplasty by Dr. Ernest Pine.  Apparently, the patient presented to [facility] urgent care clinic 5 days ago for treatment of bilateral prepatellar knee abrasions following a fall 2 weeks prior to that visit.  The patient was placed on an oral antibiotic, specifically doxycycline.  However, because of her progressive weakness over the ensuing few days, she presented to the emergency room and subsequently was admitted for further evaluation and treatment.  While in the emergency room, MRI scans of both knees were obtained.  The left knee MRI scan demonstrated a large subcutaneous hematoma anteriorly, so orthopedic consultation was requested to further evaluate this issue.  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HCC)    a.) 2/2 scleroderma Dx   LBBB (left bundle branch block)    Long term current use of  antithrombotics/antiplatelets    a.) ASA + clopidogrel   Long term current use of immunosuppressive drug    a.) MTX for RA and scleroderma Dx   OSA on CPAP    Osteoarthritis    a.) knees, spine   PAD (peripheral artery disease) (HCC)    Phlebitis    a.) x 2 (with pregnancy)   Pulmonary fibrosis (HCC)    a.) mild   Pulmonary hypertension (HCC) 02/24/2012   a.) TTE 02/24/2012: EF 55%, RVSP 33; b.) TTE 10/12/2014: EF >55%, RVSP 36.1; c.) TTE 09/02/2017: EF 55%, RVSP 45.5   Reflux esophagitis    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 b.) renal US 05/23/2020: solid LEFT renal mass measuring 1.4 x 1.5 x 1.6. 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, sclerodactyly, telangiectasias   Skin cancer 2018   Supplemental oxygen dependent    a.) 2L/[place] PRN for DOE related to mild pulmonary fibrosis and pulmonary HTN   T2DM (type 2 diabetes mellitus) (HCC)    Past Surgical History:  Procedure Laterality Date   ABDOMINAL AORTOGRAM W/LOWER EXTREMITY Left 05/16/2019   Procedure: ABDOMINAL AORTOGRAM W/LOWER EXTREMITY;  Surgeon: Runell Gess, MD;  [facility]: [facility];  Service: Cardiovascular;  Laterality: Left;   AMPUTATION TOE Left 06/02/2019   Procedure: AMPUTATION TOE;  Surgeon: Recardo Evangelist, DPM;  Location: [facility] ORS;  Service: Podiatry;  Laterality: Left;   APPENDECTOMY  1972   BREAST CYST ASPIRATION Left    neg   COLONOSCOPY WITH PROPOFOL N/A 09/26/2021   Procedure: COLONOSCOPY WITH PROPOFOL;  Surgeon: Jaynie Collins, DO;  Location: [facility];  Service: Gastroenterology;  Laterality: N/A;  DM, WHEELCHAIR, PLAVIX   CORONARY STENT INTERVENTION N/A 11/24/2022   Procedure:  CORONARY STENT INTERVENTION;  Surgeon: Iran Ouch, MD;  Location: [facility];  Service: Cardiovascular;  Laterality: N/A;   ESOPHAGOGASTRODUODENOSCOPY (EGD) WITH PROPOFOL N/A 09/26/2021   Procedure: ESOPHAGOGASTRODUODENOSCOPY (EGD) WITH PROPOFOL;  Surgeon: Jaynie Collins, DO;  Location: [facility]COPY;  Service: Gastroenterology;  Laterality: N/A;   INCISION AND DRAINAGE OF WOUND Left 06/07/2019   Procedure: IRRIGATION AND DEBRIDEMENT WOUND;  Surgeon: Recardo Evangelist, DPM;  Location: [facility] OR[facility];  Service: Podiatry;  Laterality: Left;   KNEE ARTHROPLASTY Right 11/13/2021   Procedure: COMPUTER ASSISTED TOTAL KNEE ARTHROPLASTY;  Surgeon: Donato Heinz, MD;  Location: [facility] ORS;  Service: Orthopedics;  Laterality: Right;   LEFT HEART CATH AND CORONARY ANGIOGRAPHY Right 11/24/2022   Procedure: LEFT HEART CATH AND CORONARY ANGIOGRAPHY with possible coronary intervention;  Surgeon: Laurier Nancy, MD;  Location: [facility];  Service: Cardiovascular;  Laterality: Right;   LOWER EXTREMITY ANGIOGRAPHY Left 06/03/2019   Procedure: Lower Extremity Angiography;  Surgeon: Renford Dills, MD;  Location: [facility];  Service: Cardiovascular;  Laterality: Left;   LOWER EXTREMITY ANGIOGRAPHY Right 05/21/2021   Procedure: LOWER EXTREMITY ANGIOGRAPHY;  Surgeon: Renford Dills, MD;  Location: [facility];  Service: Cardiovascular;  Laterality: Right;   PERIPHERAL VASCULAR BALLOON ANGIOPLASTY Left 05/16/2019   Procedure: PERIPHERAL VASCULAR BALLOON ANGIOPLASTY;  Surgeon: Runell Gess, MD;  Location: [facility];  Service: Cardiovascular;  Laterality: Left;  SFA   TUBAL LIGATION  1972   VEIN LIGATION AND STRIPPING     Social History   Socioeconomic History   Marital status: Married    Spouse name: [name]   Number of children: 2   Years of education: Not on file   Highest education level: Not on file  Occupational History   Occupation: retired     Comment: Market researcher  Tobacco Use   Smoking status: Former    Current packs/day: 0.00    Average packs/day: 0.5 packs/day for 15.0 years (7.5 ttl pk-yrs)    Types: Cigarettes    Start date: 05/12/1973    Quit date: 05/12/1988    Years since quitting: 34.9   Smokeless tobacco: Never  Vaping Use   Vaping status: Never Used  Substance and Sexual Activity   Alcohol use: No    Alcohol/week: 0.0 standard drinks of alcohol   Drug use: No   Sexual activity: Yes  Other Topics Concern   Not on file  Social History Narrative   Lives at home with spouse [name]    Social Determinants of Health   Financial Resource Strain: Low Risk  (01/15/2023)   Overall Financial Resource Strain (CARDIA)    Difficulty of Paying Living Expenses: Not hard at all  Food Insecurity: No Food Insecurity (04/07/2023)   Hunger Vital Sign    Worried About Running Out of Food in the Last Year: Never true    Ran Out of Food in the Last Year: Never true  Transportation Needs: No Transportation Needs (04/07/2023)   PRAPARE - Transportation    Lack of Transportation (Medical): No    Lack of  Transportation (Non-Medical): No  Physical Activity: Unknown (01/15/2023)   Exercise Vital Sign    Days of Exercise per Week: 7 days    Minutes of Exercise per Session: Not on file  Stress: No Stress Concern Present (01/15/2023)   [employer] of Occupational Health - Occupational Stress Questionnaire    Feeling of Stress : Not at all  Social Connections: Moderately Integrated (01/15/2023)   Social Connection and Isolation Panel [NHANES]    Frequency of Communication with Friends and Family: More than three times a week    Frequency of Social Gatherings with Friends and Family: Twice a week    Attends Religious Services: More than 4 times per year    Active Member of [employer] or Organizations: No    Attends Engineer, structural: Never    Marital Status: Married   Family History  Problem Relation Age of  Onset   Arthritis Mother    Heart attack Father    Cancer Sister    Cancer - Prostate Brother    Colon cancer Neg Hx    Breast cancer Neg Hx    Allergies  Allergen Reactions   Ethanol Other (See Comments)    REDNESS AND BLISTERS   Tape Other (See Comments)    Ob band aid adhesive   Pull skin off / Rash  Ob band aid adhesive  Pull skin off / Rash   Triamterene-Hctz Other (See Comments)    Weakness/fatigue   Cephalexin Rash    Blotchiness to B lower legs after several days of cephalexin for UTI   Hydrochlorothiazide W-Triamterene Other (See Comments)    Other reaction(s): Other (See Comments)  Extreme fatigue/weakness  Extreme fatigue/weakness    Other reaction(s): Other (See Comments) Extreme fatigue/weakness   Prior to Admission medications   Medication Sig Start Date End Date Taking? Authorizing Provider  acetaminophen (TYLENOL) 650 MG CR tablet Take 650-1,300 mg by mouth every 8 (eight) hours as needed for pain.   Yes [provider]  allopurinol (ZYLOPRIM) 100 MG tablet Take 1 tablet (100 mg total) by mouth daily. 11/14/22  Yes Dale Hamilton, MD  amLODipine (NORVASC) 5 MG tablet Take 1 tablet (5 mg total) by mouth daily. 01/21/23  Yes Dale Sharon, MD  aspirin EC 81 MG tablet Take 1 tablet (81 mg total) by mouth daily. Swallow whole. 11/26/22  Yes Enedina Finner, MD  atorvastatin (LIPITOR) 80 MG tablet Take 1 tablet (80 mg total) by mouth daily. Patient taking differently: Take 40 mg by mouth daily. 11/27/22  Yes Leeroy Bock, MD  clopidogrel (PLAVIX) 75 MG tablet Take 1 tablet (75 mg total) by mouth daily with breakfast. 01/21/23  Yes Dale Bonsall, MD  doxycycline (VIBRAMYCIN) 100 MG capsule Take 100 mg by mouth 2 (two) times daily. 04/02/23 04/12/23 Yes [provider]  fexofenadine (ALLEGRA) 180 MG tablet Take 1 tablet (180 mg total) by mouth daily. Patient taking differently: Take 180 mg by mouth daily as needed for allergies. 09/10/20  Yes Becky Augusta, NP  folic acid (FOLVITE) 1 MG tablet Take 1 mg by mouth every evening.    Yes [provider]  ipratropium (ATROVENT) 0.06 % nasal spray Place 2 sprays into both nostrils 4 (four) times daily. Patient taking differently: Place 2 sprays into both nostrils 4 (four) times daily as needed for rhinitis. 09/10/20  Yes Becky Augusta, NP  losartan (COZAAR) 100 MG tablet Take 1 tablet (100 mg total) by mouth daily. 11/14/22  Yes Dale [place], MD  magnesium oxide (MAG-OX) 400 MG tablet Take 1 tablet (400 mg total) by mouth 2 (two) times daily. 03/19/23  Yes Dale Ryan, MD  metFORMIN (GLUCOPHAGE) 500 MG tablet Take 1 tablet by mouth once daily 03/03/23  Yes Dale [place], MD  methotrexate (RHEUMATREX) 2.5 MG tablet Take 10 mg by mouth every Wednesday. 01/10/20  Yes [provider]  metoprolol succinate (TOPROL-XL) 100 MG 24 hr tablet Take 1 tablet (100 mg total) by mouth daily. Take with or immediately following a meal. 01/21/23  Yes Dale Varnado, MD  nitroGLYCERIN (NITROSTAT) 0.4 MG SL tablet Place 1 tablet (0.4 mg total) under the tongue every 5 (five) minutes x 3 doses as needed for chest pain. 11/25/22  Yes Enedina Finner, MD  pantoprazole (PROTONIX) 40 MG tablet Take 1 tablet by mouth once daily 09/26/22  Yes Dale [place], MD  spironolactone (ALDACTONE) 25 MG tablet Take 1 tablet by mouth once daily 03/31/23  Yes Dale [place], MD  torsemide (DEMADEX) 20 MG tablet Take 1 tablet (20 mg total) by mouth daily. Taking 1/2 tablet just as needed 03/25/23  Yes Agbor-Etang, Arlys Jousha Schwandt, MD  traMADol (ULTRAM) 50 MG tablet Take 50 mg by mouth every 8 (eight) hours as needed. 04/02/23  Yes [provider]  amoxicillin-clavulanate (AUGMENTIN) 500-125 MG tablet Take 1 tablet by mouth 2 (two) times daily. Patient not taking: Reported on 04/07/2023 03/21/23   Dale Terry, MD  glucose blood (BAYER CONTOUR TEST) test strip USE  STRIP TO CHECK GLUCOSE TWICE DAILY. Dx: E11.9 05/29/17    Dale Sparkman, MD  Magnesium 200 MG TABS Take by mouth. 2 tablets in the morning and 1 tablet at night Patient not taking: Reported on 04/07/2023    [provider]  MICROLET LANCETS MISC Check sugar once daily, [employer] Microlet Lancets. Dx E11.9 10/02/14   Dale Kettering, MD   ECHOCARDIOGRAM COMPLETE BUBBLE STUDY  Result Date: 04/07/2023    ECHOCARDIOGRAM REPORT   Patient Name:   LYRIK RODRICK Date of Exam: 04/07/2023 Medical Rec #:  [id number]    Height:       66.0 in Accession #:    [id number]   Weight:       202.0 lb Date of Birth:  February 11, 1941    BSA:          2.008 m Patient Age:    82 years     BP:           105/52 mmHg Patient Gender: F            HR:           61 bpm. Exam Location:  [facility] Procedure: 2D Echo, Cardiac Doppler, Color Doppler and Saline Contrast Bubble            Study Indications:     Syncope  History:         Patient has prior history of Echocardiogram examinations, most                  recent 11/23/2022. Cardiomyopathy and CHF, CAD and Previous                  Myocardial Infarction, Abnormal ECG, Pulmonary HTN,                  Signs/Symptoms:Syncope, Dizziness/Lightheadedness, Murmur and                  Shortness of Breath; Risk Factors:Hypertension, Diabetes and  Dyslipidemia. CKD.  Sonographer:     Mikki Harbor Referring Phys:  [id number] Eliezer Mccoy PATEL Diagnosing Phys: Yvonne Kendall MD  Sonographer Comments: Image acquisition challenging due to patient body habitus. IMPRESSIONS  1. Left ventricular ejection fraction, by estimation, is 50 to 55%. The left ventricle has low normal function. The left ventricle has no regional wall motion abnormalities. There is mild left ventricular hypertrophy. Left ventricular diastolic parameters are consistent with Grade I diastolic dysfunction (impaired relaxation). Elevated left atrial pressure.  2. Right ventricular systolic function is mildly reduced. The right ventricular size is mildly enlarged. Severely  increased right ventricular wall thickness. Tricuspid regurgitation signal is inadequate for assessing PA pressure.  3. Left atrial size was mild to moderately dilated.  4. The mitral valve is degenerative. Mild to moderate mitral valve regurgitation. No evidence of mitral stenosis.  5. The aortic valve is tricuspid. There is mild thickening of the aortic valve. Aortic valve regurgitation is mild. Aortic valve sclerosis is present, with no evidence of aortic valve stenosis.  6. The inferior vena cava is normal in size with <50% respiratory variability, suggesting right atrial pressure of 8 mmHg.  7. Agitated saline contrast bubble study was negative, with no evidence of any interatrial shunt. FINDINGS  Left Ventricle: Left ventricular ejection fraction, by estimation, is 50 to 55%. The left ventricle has low normal function. The left ventricle has no regional wall motion abnormalities. The left ventricular internal cavity size was normal in size. There is mild left ventricular hypertrophy. Left ventricular diastolic parameters are consistent with Grade I diastolic dysfunction (impaired relaxation). Elevated left atrial pressure. Right Ventricle: The right ventricular size is mildly enlarged. Severely increased right ventricular wall thickness. Right ventricular systolic function is mildly reduced. Tricuspid regurgitation signal is inadequate for assessing PA pressure. Left Atrium: Left atrial size was mild to moderately dilated. Right Atrium: Right atrial size was normal in size. Pericardium: There is no evidence of pericardial effusion. Mitral Valve: The mitral valve is degenerative in appearance. There is mild thickening of the mitral valve leaflet(s). There is mild calcification of the mitral valve leaflet(s). Mild to moderate mitral annular calcification. Mild to moderate mitral valve regurgitation. No evidence of mitral valve stenosis. MV peak gradient, 11.6 mmHg. The mean mitral valve gradient is 4.0 mmHg.  Tricuspid Valve: The tricuspid valve is not well visualized. Tricuspid valve regurgitation is not demonstrated. Aortic Valve: The aortic valve is tricuspid. There is mild thickening of the aortic valve. Aortic valve regurgitation is mild. Aortic valve sclerosis is present, with no evidence of aortic valve stenosis. Aortic valve mean gradient measures 8.0 mmHg. Aortic valve peak gradient measures 15.7 mmHg. Aortic valve area, by VTI measures 2.03 cm. Pulmonic Valve: The pulmonic valve was not well visualized. Pulmonic valve regurgitation is trivial. No evidence of pulmonic stenosis. Aorta: The aortic root and ascending aorta are structurally normal, with no evidence of dilitation. Pulmonary Artery: The pulmonary artery is not well seen. Venous: The inferior vena cava is normal in size with less than 50% respiratory variability, suggesting right atrial pressure of 8 mmHg. IAS/Shunts: The interatrial septum was not well visualized. Agitated saline contrast was given intravenously to evaluate for intracardiac shunting. Agitated saline contrast bubble study was negative, with no evidence of any interatrial shunt.  LEFT VENTRICLE PLAX 2D LVIDd:         4.00 cm   Diastology LVIDs:         2.90 cm   LV e' medial:    5.22  cm/s LV PW:         1.15 cm   LV E/e' medial:  18.6 LV IVS:        1.18 cm   LV e' lateral:   7.07 cm/s LVOT diam:     2.00 cm   LV E/e' lateral: 13.7 LV SV:         97 LV SV Index:   48 LVOT Area:     3.14 cm  RIGHT VENTRICLE RV Basal diam:  3.65 cm RV Mid diam:    3.60 cm RV S prime:     12.00 cm/s LEFT ATRIUM              Index        RIGHT ATRIUM           Index LA diam:        4.20 cm  2.09 cm/m   RA Area:     16.20 cm LA Vol (A2C):   111.0 ml 55.27 ml/m  RA Volume:   37.40 ml  18.62 ml/m LA Vol (A4C):   69.0 ml  34.36 ml/m LA Biplane Vol: 87.9 ml  43.77 ml/m  AORTIC VALVE                     PULMONIC VALVE AV Area (Vmax):    2.09 cm      PV Vmax:       0.91 m/s AV Area (Vmean):   2.07 cm       PV Peak grad:  3.3 mmHg AV Area (VTI):     2.03 cm AV Vmax:           198.00 cm/s AV Vmean:          127.000 cm/s AV VTI:            0.479 m AV Peak Grad:      15.7 mmHg AV Mean Grad:      8.0 mmHg LVOT Vmax:         132.00 cm/s LVOT Vmean:        83.600 cm/s LVOT VTI:          0.309 m LVOT/AV VTI ratio: 0.65  AORTA Ao Root diam: 3.50 cm Ao Asc diam:  2.90 cm MITRAL VALVE MV Area (PHT): 2.86 cm     SHUNTS MV Area VTI:   2.42 cm     Systemic VTI:  0.31 m MV Peak grad:  11.6 mmHg    Systemic Diam: 2.00 cm MV Mean grad:  4.0 mmHg MV Vmax:       1.70 m/s MV Vmean:      96.8 cm/s MV Decel Time: 265 msec MV E velocity: 96.90 cm/s MV A velocity: 144.00 cm/s MV E/A ratio:  0.67 Cristal Deer End MD Electronically signed by Yvonne Kendall MD Signature Date/Time: 04/07/2023/6:36:25 PM    Final    NM Pulmonary Perfusion  Result Date: 04/07/2023 CLINICAL DATA:  Pulmonary embolism (PE) suspected, high prob EXAM: NUCLEAR MEDICINE PERFUSION LUNG SCAN TECHNIQUE: Perfusion images were obtained in multiple projections after intravenous injection of radiopharmaceutical. Ventilation scans intentionally deferred if perfusion scan and chest x-ray adequate for interpretation during COVID 19 epidemic. RADIOPHARMACEUTICALS:  4.38 mCi Tc-53m MAA IV COMPARISON:  X-ray 04/07/2023 FINDINGS: Heterogeneous distribution of radiotracer within both lungs. Scattered small peripheral subsegmental perfusion defects. No segmental or larger mismatched perfusion defect. IMPRESSION: Low probability for pulmonary embolism. Electronically Signed   By: Duanne Guess D.O.  On: 04/07/2023 10:38   DG Chest 2 View  Result Date: 04/07/2023 CLINICAL DATA:  Shortness of breath. EXAM: CHEST - 2 VIEW COMPARISON:  11/21/2022. FINDINGS: Minimal atelectasis/scarring noted overlying the left lateral costophrenic angle. Bilateral lung fields are otherwise clear. No acute consolidation or lung collapse. Bilateral costophrenic angles are clear. Stable  cardio-mediastinal silhouette. No acute osseous abnormalities. The soft tissues are within normal limits. IMPRESSION: *No active cardiopulmonary disease. Electronically Signed   By: Jules Schick M.D.   On: 04/07/2023 10:19   MR KNEE RIGHT WO CONTRAST  Result Date: 04/07/2023 CLINICAL DATA:  Possible soft tissue infection, fall. EXAM: MRI OF THE RIGHT KNEE WITHOUT CONTRAST TECHNIQUE: Multiplanar, multisequence MR imaging of the knee was performed. No intravenous contrast was administered. 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 MENISCI Medial meniscus:  N/A Lateral meniscus:  N/A LIGAMENTS Cruciates:  N/A Collaterals:  Intact where visible. CARTILAGE Patellofemoral:  N/A Medial:  N/A Lateral:  N/A Joint:  Small effusion in the suprapatellar bursa. Popliteal Fossa: Very small Baker's cyst. Small amount of infiltrative edema along the popliteal space. Extensor Mechanism:  Unremarkable Bones: Subtle marrow edema the proximal fibula without definite fracture, potentially from mild stress reaction or arthropathy. Other: Focal infiltration of the subcutaneous edema anterolateral to the proximal tibia for example on image 25 series 11, likely representing bruising. No large hematoma in this vicinity. There is mild posterior and and anterolateral subcutaneous edema tracking along the knee in into the calf. No drainable abscess. IMPRESSION: 1. Focal edema of the subcutaneous edema anterolateral to the proximal tibia, likely representing bruising. No large hematoma in this vicinity. 2. Mild posterior and anterolateral subcutaneous edema tracking along the knee in into the calf. No drainable abscess. 3. Subtle marrow edema in the proximal fibula without definite fracture, potentially from mild stress reaction or arthropathy. 4. Small effusion in the suprapatellar bursa. Very small Baker's cyst. 5. Total knee prosthesis in place with  associated metal artifact. Electronically Signed   By: Gaylyn Rong M.D.   On: 04/07/2023 08:10   MR KNEE LEFT WO CONTRAST  Result Date: 04/07/2023 CLINICAL DATA:  Fall. Knee pain and trauma. Reduced mobility. Swelling in both knees. EXAM: MRI OF THE LEFT KNEE WITHOUT CONTRAST TECHNIQUE: Multiplanar, multisequence MR imaging of the knee was performed. No intravenous contrast was administered. COMPARISON:  Report from radiographs dated 04/02/2023 FINDINGS: MENISCI Medial meniscus: Prominent and mostly degenerative tearing of the medial meniscus with grade 3 signal extending obliquely to the inferior surface in the anterior and posterior horns spanning nearly the entire length of the meniscus, and also abnormal grade 3 extension to the superior surface in the anterior horn on image 55 series 18. Lateral meniscus: Degenerative tearing of the posterior horn and midbody with hazy accentuated signal throughout the diminutive visualized meniscus, and peripheral extrusion. Likely degenerative tearing in the anterior horn especially involving the central/free edge component of the anterior horn. LIGAMENTS Cruciates:  Unremarkable Collaterals: Mild proximal popliteus tendinopathy. Otherwise unremarkable. CARTILAGE Patellofemoral: Generally moderate degenerative chondral thinning although with prominent chondral thinning superiorly along the posterior patellar ridge and adjacent lateral facet, with underlying small subcortical foci of edema. Medial: Moderate to prominent degenerative chondral thinning with marginal spurring. Lateral: Severe degenerative chondral thinning with marginal spurring. Joint:  Small knee effusion. Popliteal Fossa: Infiltrative edema in the popliteal space. Small Baker's cyst. Extensor Mechanism:  Unremarkable Bones: No significant extra-articular osseous abnormalities identified. Other: 10.1 by  1.9 by 11.4 cm (volume = 110 cm^3) anterolateral subcutaneous hematoma along the knee and  proximal shin. IMPRESSION: 1. Large anterolateral subcutaneous hematoma along the knee and proximal shin, with a volume of 110 cc. 2. Prominent and mostly degenerative tearing of the medial meniscus. 3. Degenerative tearing of the lateral meniscus, with peripheral extrusion. 4. 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 5. Small knee effusion. Small Baker's cyst. 6. Mild proximal popliteus tendinopathy. Electronically Signed   By: Gaylyn Rong M.D.   On: 04/07/2023 08:05   US Venous Img Lower Bilateral (DVT)  Result Date: 04/07/2023 CLINICAL DATA:  Pain and swelling left lower leg. EXAM: Bilateral LOWER EXTREMITY VENOUS DOPPLER ULTRASOUND TECHNIQUE: Gray-scale sonography with compression, as well as color and duplex ultrasound, were performed to evaluate the deep venous system(s) from the level of the common femoral vein through the popliteal and proximal calf veins. COMPARISON:  Ultrasound 06/16/2018 FINDINGS: VENOUS Normal compressibility of the common femoral, superficial femoral, and popliteal veins, as well as the visualized calf veins. Visualized portions of profunda femoral vein and great saphenous vein unremarkable. No filling defects to suggest DVT on grayscale or color Doppler imaging. Doppler waveforms show normal direction of venous flow, normal respiratory plasticity and response to augmentation. Limited views of the contralateral common femoral vein are unremarkable. OTHER Subcutaneous edema in both calves. Limitations: none IMPRESSION: Negative for acute DVT. Electronically Signed   By: Minerva Fester M.D.   On: 04/07/2023 01:28   CT Head Wo Contrast  Result Date: 04/06/2023 CLINICAL DATA:  Head trauma EXAM: CT HEAD WITHOUT CONTRAST TECHNIQUE: Contiguous axial images were obtained from the base of the skull through the vertex without intravenous contrast. RADIATION DOSE REDUCTION: This exam was  performed according to the departmental dose-optimization program which includes automated exposure control, adjustment of the mA and/or kV according to patient size and/or use of iterative reconstruction technique. COMPARISON:  None Available. FINDINGS: Brain: No evidence of acute infarction, hemorrhage, hydrocephalus, extra-axial collection or mass lesion/mass effect. There is moderate diffuse atrophy. There is mild periventricular white matter hypodensity, likely chronic small vessel ischemic change. Vascular: Atherosclerotic calcifications are present within the cavernous internal carotid arteries. Skull: Normal. Negative for fracture or focal lesion. Sinuses/Orbits: No acute finding. Other: None. IMPRESSION: 1. No acute intracranial process. 2. Moderate diffuse atrophy and mild chronic small vessel ischemic change. Electronically Signed   By: Darliss Cheney M.D.   On: 04/06/2023 19:16    Positive ROS: All other systems have been reviewed and were otherwise negative with the exception of those mentioned in the HPI and as above.  Physical Exam: General:  Alert, no acute distress Psychiatric:  Patient is competent for consent with normal mood and affect   Cardiovascular:  No pedal edema Respiratory:  No wheezing, non-labored breathing GI:  Abdomen is soft and non-tender Skin:  No lesions in the area of chief complaint Neurologic:  Sensation intact distally Lymphatic:  No axillary or cervical lymphadenopathy  Orthopedic Exam:  Orthopedic examination of the left knee demonstrates an area of superficial abrasion over the pretibial region at the level of the tibial tubercle measuring approximately 2.5 x 3.5 cm.  There is no active drainage from the area but there is some surrounding erythema.  She has mild to moderate tenderness to palpation around this area.  There also is significant swelling over the pretibial region in the area of the tibial tubercle extending laterally, consistent with a  subcutaneous hematoma  identified on MRI scan.  She is able to actively range her knee from 0 to 100 degrees without pain.  She is grossly neurovascularly intact to the left lower extremity and foot.  Orthopedic examination of the right knee is notable for a well-healed anterior midline surgical incision.  In addition, there is an area of superficial abrasion measuring approximately 1.5 x 2 cm.  There is no drainage from this area and only minimal surrounding erythema.  She has mild tenderness to palpation in the area of the abrasion, but there are no other areas of tenderness around the knee.  She is able to actively range her knee from 0 to 100 degrees without pain.  She is grossly neurovascularly intact to the right lower extremity and foot.  X-rays:  MRI scans of both knees are available for review and have been reviewed by myself.  The findings are as described above.  Assessment: 1.  Superficial abrasion anterior left knee with surrounding cellulitis. 2.  Subcutaneous hematoma anterior left knee. 3.  Superficial abrasion anterior right knee status post prior right total knee arthroplasty.  Plan: The treatment options have been discussed with the patient.  At this point, the right knee appears to be healing well and requires no additional treatment.    Regarding her left knee, the hematoma appears to be sterile at this time as she does not have any fevers and her white count is normal.  I would recommend that she continue to receive her oral antibiotic as it appears to be working.  In addition, I think it would be reasonable to apply mupirocin ointment twice daily over this area of abrasion to try to expedite his healing and to minimize the risk of secondary infection.  Given the large hematoma beneath this abrasion, I would not like to see this hematoma become infected.    At this point, I do not feel that the hematoma requires any additional treatment.  Rather, it should be allowed to resorb  on its own as attempting to aspirate it or drain it might actually introduce infection.  The patient may be mobilized with physical therapy weightbearing as tolerated on both legs and using a walker for balance and support.  She may receive medications for pain as deemed appropriate from a medical standpoint.  Thank you for asking me to participate in the care of this most delightful woman.  I will be happy to follow her with you.   Maryagnes Amos, MD  Beeper #:  [phone]  04/07/2023 7:40 PM

## 2023-04-07 NOTE — Assessment & Plan Note (Signed)
Will continue patient on PPI therapy.

## 2023-04-07 NOTE — Assessment & Plan Note (Signed)
Urinalysis shows more than 50 WBCs and 0-5 RBC that is nitrite positive leukocyte Continue patient on Rocephin.  Patient recently had a Proteus infection.  Few weeks ago

## 2023-04-07 NOTE — Assessment & Plan Note (Signed)
Will continue patient on reduced dose metoprolol, aspirin 81, atorvastatin, Plavix 75.

## 2023-04-07 NOTE — Assessment & Plan Note (Signed)
2 g IV magnesium given

## 2023-04-07 NOTE — Assessment & Plan Note (Signed)
Secondary to patient's fall.  Patient has severe injury.  Will obtain an MRI to make sure there is no infection or bleeding.  As needed Tylenol.  Pt also has 1+ pedal pulses and needs ABI or CTA if creatinine improves fro PAD eval.  Will follow CBC, CRP and start patient on antibiotics.

## 2023-04-07 NOTE — Assessment & Plan Note (Signed)
Chart review shows patient had a history of NSTEMI.  Troponin today is flat, BNP of 76.1, stable, no chest pain.  Will continue patient on reduced dose metoprolol, aspirin 81, atorvastatin, Plavix 75.

## 2023-04-07 NOTE — Assessment & Plan Note (Signed)
Glycemic protocol.  Carb consistent diet.

## 2023-04-07 NOTE — Assessment & Plan Note (Signed)
BMI 32.60 with current height and weight in computer.

## 2023-04-07 NOTE — ED Notes (Signed)
Pt to nuclear medicine.

## 2023-04-07 NOTE — Assessment & Plan Note (Signed)
Patient with scleroderma with crest syndrome also.  On methotrexate

## 2023-04-07 NOTE — Assessment & Plan Note (Signed)
Continue Protonix °

## 2023-04-07 NOTE — Assessment & Plan Note (Signed)
Suspect patient's generalized weakness secondary to low blood pressure, electrolyte abnormalities.  Will monitor on telemetry for any cardiac rhythm disturbances.

## 2023-04-08 DIAGNOSIS — E871 Hypo-osmolality and hyponatremia: Secondary | ICD-10-CM | POA: Insufficient documentation

## 2023-04-08 DIAGNOSIS — J9601 Acute respiratory failure with hypoxia: Secondary | ICD-10-CM | POA: Diagnosis not present

## 2023-04-08 DIAGNOSIS — L03116 Cellulitis of left lower limb: Secondary | ICD-10-CM | POA: Diagnosis not present

## 2023-04-08 DIAGNOSIS — N3000 Acute cystitis without hematuria: Secondary | ICD-10-CM

## 2023-04-08 LAB — URINE CULTURE

## 2023-04-08 LAB — BASIC METABOLIC PANEL
Anion gap: 4 — ABNORMAL LOW (ref 5–15)
BUN: 25 mg/dL — ABNORMAL HIGH (ref 8–23)
CO2: 24 mmol/L (ref 22–32)
Calcium: 9.4 mg/dL (ref 8.9–10.3)
Chloride: 106 mmol/L (ref 98–111)
Creatinine, Ser: 1.07 mg/dL — ABNORMAL HIGH (ref 0.44–1.00)
GFR, Estimated: 52 mL/min — ABNORMAL LOW (ref >60)
Glucose, Bld: 79 mg/dL (ref 70–99)
Potassium: 4.5 mmol/L (ref 3.5–5.1)
Sodium: 134 mmol/L — ABNORMAL LOW (ref 135–145)

## 2023-04-08 LAB — CBC
HCT: 32 % — ABNORMAL LOW (ref 36.0–46.0)
Hemoglobin: 10.6 g/dL — ABNORMAL LOW (ref 12.0–15.0)
MCH: 32.9 pg (ref 26.0–34.0)
MCHC: 33.1 g/dL (ref 30.0–36.0)
MCV: 99.4 fL (ref 80.0–100.0)
Platelets: 192 10*3/uL (ref 150–400)
RBC: 3.22 MIL/uL — ABNORMAL LOW (ref 3.87–5.11)
RDW: 15.9 % — ABNORMAL HIGH (ref 11.5–15.5)
WBC: 5.8 10*3/uL (ref 4.0–10.5)
nRBC: 0 % (ref 0.0–0.2)

## 2023-04-08 LAB — HIGH SENSITIVITY CRP: CRP, High Sensitivity: 71.64 mg/L — ABNORMAL HIGH (ref 0.00–3.00)

## 2023-04-08 LAB — MAGNESIUM: Magnesium: 1.9 mg/dL (ref 1.7–2.4)

## 2023-04-08 MED ORDER — AMOXICILLIN-POT CLAVULANATE 875-125 MG PO TABS
1.0000 | ORAL_TABLET | Freq: Two times a day (BID) | ORAL | Status: AC
  Administered 2023-04-09 – 2023-04-12 (×8): 1 via ORAL
  Filled 2023-04-08 (×9): qty 1

## 2023-04-08 MED ORDER — ENSURE ENLIVE PO LIQD
237.0000 mL | Freq: Two times a day (BID) | ORAL | Status: DC
  Administered 2023-04-09 – 2023-04-11 (×3): 237 mL via ORAL

## 2023-04-08 NOTE — Progress Notes (Signed)
Occupational Therapy Treatment Patient Details Name: Nina Perez MRN: [id number] DOB: 1940/11/03 Today's Date: 04/08/2023   History of present illness [name] is a 82 y.o. female with medical history significant for congestive heart failure, hypertension type II, GERD hypercholesterolemia, osteoarthritis allergies to ethanol, type adhesives, triamterene-HCTZ, Keflex, HCTZ came to the emergency room today with gradual onset of generalized weakness with past few days patient on antibiotic regimen for cellulitis of her left knee she was also recently treated for urinary tract infection. Chest x-ray negative. Will get orthopedic consultation.   OT comments  Pt is supine in bed on arrival. Plesant and agreeable to PT/OT session to maximize pt and therapist safety. She reports pain to L knee at 6/10 with nurse provided meds during session. Pt performed bed mobility with Mod A and verb cues. Forward scooting with SUP. Multiple STS trials from EOB to RW with Min A x2 progressing to Min/Mod A x1 with cueing for upright posture and ~1 min standing tolerance each trial. Pt demo ability to take lateral steps to [facility] with RW and CGA x2. Notified nurse of ability to use [facility] for toileting needs versus bed pan to maximize mobility/time OOB. Pt required Max A x2 to return to supine with more throbbing pain to L knee after take steps.  Pt returned to bed with all needs in place and will cont to require skilled acute OT services to maximize her safety, strength, and IND to return to PLOF.       If plan is discharge home, recommend the following:  A lot of help with walking and/or transfers;A lot of help with bathing/dressing/bathroom;Assistance with cooking/housework;Help with stairs or ramp for entrance;Assist for transportation   Equipment Recommendations  Other (comment) (defer)    Recommendations for Other Services      Precautions / Restrictions Precautions Precautions: Fall Restrictions Weight  Bearing Restrictions: Yes LLE Weight Bearing: Weight bearing as tolerated Other Position/Activity Restrictions: per Ortho Note       Mobility Bed Mobility Overal bed mobility: Needs Assistance Bed Mobility: Supine to Sit, Sit to Supine     Supine to sit: Mod assist, Used rails, HOB elevated Sit to supine: Max assist, +2 for physical assistance        Transfers Overall transfer level: Needs assistance Equipment used: Rolling walker (2 wheels) Transfers: Sit to/from Stand Sit to Stand: +2 physical assistance, Min assist           General transfer comment: STS from EOB x2 with Min A x2 to RW then progressed to Min/Mod A x1 for final stand and able to take lateral steps using RW with increased time to [facility]; ~1 min standing tolerance with cueing for upright posture throughout     Balance Overall balance assessment: Needs assistance Sitting-balance support: Feet supported, No upper extremity supported Sitting balance-Leahy Scale: Good Sitting balance - Comments: no LOB noted at EOB   Standing balance support: Reliant on assistive device for balance, During functional activity, Bilateral upper extremity supported Standing balance-Leahy Scale: Fair Standing balance comment: poor standing posture, forward lean but able to correct with cues. , CGA to Min A for balance.                           ADL either performed or assessed with clinical judgement   ADL  Extremity/Trunk Assessment Upper Extremity Assessment Upper Extremity Assessment: Generalized weakness   Lower Extremity Assessment Lower Extremity Assessment: Generalized weakness        Vision       Perception     Praxis      Cognition Arousal: Alert Behavior During Therapy: WFL for tasks assessed/performed Overall Cognitive Status: Within Functional Limits for tasks assessed                                           Exercises      Shoulder Instructions       General Comments      Pertinent Vitals/ Pain          Home Living                                          Prior Functioning/Environment              Frequency  Min 1X/week        Progress Toward Goals  OT Goals(current goals can now be found in the care plan section)  Progress towards OT goals: Progressing toward goals  Acute Rehab OT Goals Patient Stated Goal: improve strength and pain OT Goal Formulation: With patient Time For Goal Achievement: 04/21/23 Potential to Achieve Goals: Good  Plan      Co-evaluation    PT/OT/SLP Co-Evaluation/Treatment: Yes Reason for Co-Treatment: For patient/therapist safety;To address functional/ADL transfers PT goals addressed during session: Mobility/safety with mobility OT goals addressed during session: ADL's and self-care      AM-PAC OT "6 Clicks" Daily Activity     Outcome Measure   Help from another person eating meals?: None Help from another person taking care of personal grooming?: None Help from another person toileting, which includes using toliet, bedpan, or urinal?: A Lot Help from another person bathing (including washing, rinsing, drying)?: A Lot Help from another person to put on and taking off regular upper body clothing?: A Little Help from another person to put on and taking off regular lower body clothing?: A Lot 6 Click Score: 17    End of Session Equipment Utilized During Treatment: Rolling walker (2 wheels);Gait belt  OT Visit Diagnosis: Unsteadiness on feet (R26.81);Other abnormalities of gait and mobility (R26.89);Muscle weakness (generalized) (M62.81)   Activity Tolerance Patient tolerated treatment well   Patient Left in bed;with call bell/phone within reach;with bed alarm set;with family/visitor present   Nurse Communication Mobility status        Time: 9811-9147 OT Time Calculation (min): 24 min  Charges: OT  General Charges $OT Visit: 1 Visit OT Treatments $Therapeutic Activity: 8-22 mins  [name], OTR/L  04/08/23, 1:10 PM   [name] 04/08/2023, 1:08 PM

## 2023-04-08 NOTE — TOC Progression Note (Signed)
Transition of Care [facility]) - Progression Note    Patient Details  Name: Nina Perez MRN: [id number] Date of Birth: 02-06-1941  Transition of [facility]) CM/SW Contact  Allena Katz, LCSW Phone Number: 04/08/2023, 3:02 PM  Clinical Narrative:   [facility] reports that they can accept patient on Monday     Expected Discharge Plan: Skilled Nursing Facility Barriers to Discharge: Continued Medical Work up  Expected Discharge Plan and Services       Living arrangements for the past 2 months: Single Family Home                                       Social Determinants of Health (SDOH) Interventions SDOH Screenings   Food Insecurity: No Food Insecurity (04/07/2023)  Housing: Low Risk  (04/07/2023)  Transportation Needs: No Transportation Needs (04/07/2023)  Utilities: Not At Risk (04/07/2023)  Alcohol Screen: Low Risk  (01/15/2023)  Depression (PHQ2-9): Low Risk  (02/04/2023)  Financial Resource Strain: Low Risk  (01/15/2023)  Physical Activity: Unknown (01/15/2023)  Social Connections: Moderately Integrated (01/15/2023)  Stress: No Stress Concern Present (01/15/2023)  Tobacco Use: Medium Risk (04/06/2023)  Health Literacy: Adequate Health Literacy (01/15/2023)    Readmission Risk Interventions    04/08/2023    2:23 PM  Readmission Risk Prevention Plan  Transportation Screening Complete  PCP or Specialist Appt within 3-5 Days Complete  HRI or Home Care Consult Complete  Medication Review (RN Care Manager) Complete

## 2023-04-08 NOTE — Plan of Care (Signed)
  Problem: Education: Goal: Knowledge of General Education information will improve Description Including pain rating scale, medication(s)/side effects and non-pharmacologic comfort measures Outcome: Progressing   

## 2023-04-08 NOTE — Progress Notes (Signed)
Physical Therapy Treatment Patient Details Name: Nina Perez MRN: [id number] DOB: April 11, 1941 Today's Date: 04/08/2023   History of Present Illness Nina Perez is a 82 y.o. female with medical history significant for congestive heart failure, hypertension type II, GERD hypercholesterolemia, osteoarthritis allergies to ethanol, type adhesives, triamterene-HCTZ, Keflex, HCTZ came to the emergency room today with gradual onset of generalized weakness with past few days patient on antibiotic regimen for cellulitis of her left knee she was also recently treated for urinary tract infection. Chest x-ray negative. Will get orthopedic consultation.    PT Comments  Patient received supine in bed with family members at bedside, agreeable to PT/OT tx session this date/time. Per Ortho Note, patient is WBAT on LLE. Patient able to come from supine > sit with Mod A of one this date demonstrating progress. Patient completed multiple stands from EOB (bed slightly elevated) with Min A +2 initially progressing to Min A of one therapist with cues for hand positioning and technique. Mild lightheadedness reported on initial rep but improvements noted. Patient able to lateral side step along side bed to scoot to [facility] with CGA to Min A +2; mild buckling noted in LLE with some endorsement of increased pain. Patient requesting to return to bed, with Max A + 2 for sit > supine due to fatigue. Pt is demonstrating progress and will continue to benefit from skilled acute PT services to maximize functional mobility with hospitalized. Will continue to follow acutely.    If plan is discharge home, recommend the following: A lot of help with walking and/or transfers;A lot of help with bathing/dressing/bathroom;Assist for transportation;Help with stairs or ramp for entrance   Can travel by private vehicle     No  Equipment Recommendations  Other (comment) (TBD at next level of care)    Recommendations for Other Services        Precautions / Restrictions Precautions Precautions: Fall Restrictions Weight Bearing Restrictions: Yes LLE Weight Bearing: Weight bearing as tolerated Other Position/Activity Restrictions: per Ortho Note     Mobility  Bed Mobility Overal bed mobility: Needs Assistance Bed Mobility: Supine to Sit, Sit to Supine     Supine to sit: Mod assist, Used rails, HOB elevated Sit to supine: Max assist, +2 for physical assistance   General bed mobility comments: Pt able to go from supine > sit with with HOB slightly elevated and use of rails, Mod A required. Due to fatigue/pain at end of session, patietn continue to require Max A +2 for sit > supine    Transfers Overall transfer level: Needs assistance Equipment used: Rolling walker (2 wheels) Transfers: Sit to/from Stand Sit to Stand: +2 physical assistance, Min assist           General transfer comment: Patient able to stand from EOB x 3 reps, cues for hand placement. Initial require Min A +2 but progressing to Min to Mod A of one therapist. Improved completion with one hand placement on lower surface, and opposite UE on RW due to difficulty with transition. Standing tolerance approx 1 minute per requiring seated rest break.    Ambulation/Gait Ambulation/Gait assistance: Contact guard assist, +2 safety/equipment Gait Distance (Feet): 3 Feet Assistive device: Rolling walker (2 wheels)   Gait velocity: Decreased     General Gait Details: patient able to lateral step alongside bed to [facility], with CGA + 2 for safety. Minimal foot clearance noted, with full WBing on LLE, mild buckling noted. Increased pain reported with WBing.   Stairs  Wheelchair Mobility     Tilt Bed    Modified Rankin (Stroke Patients Only)       Balance Overall balance assessment: Needs assistance Sitting-balance support: Feet supported, No upper extremity supported Sitting balance-Leahy Scale: Good Sitting balance - Comments: no LOB  noted at EOB   Standing balance support: Reliant on assistive device for balance, During functional activity, Bilateral upper extremity supported Standing balance-Leahy Scale: Fair Standing balance comment: poor standing posture, forward lean but able to correct with cues. , CGA to Min A for balance.                            Cognition Arousal: Alert Behavior During Therapy: WFL for tasks assessed/performed Overall Cognitive Status: Within Functional Limits for tasks assessed                                          Exercises      General Comments        Pertinent Vitals/Pain Pain Assessment Pain Assessment: 0-10 Pain Score: 6  Pain Location: L Knee Pain Descriptors / Indicators: Aching, Tender, Sore Pain Intervention(s): RN gave pain meds during session, Monitored during session, Limited activity within patient's tolerance    Home Living                          Prior Function            PT Goals (current goals can now be found in the care plan section) Acute Rehab PT Goals PT Goal Formulation: With patient/family Time For Goal Achievement: 04/21/23 Potential to Achieve Goals: Good Progress towards PT goals: Progressing toward goals    Frequency    Min 1X/week      PT Plan      Co-evaluation PT/OT/SLP Co-Evaluation/Treatment: Yes Reason for Co-Treatment: For patient/therapist safety;To address functional/ADL transfers PT goals addressed during session: Mobility/safety with mobility OT goals addressed during session: ADL's and self-care      AM-PAC PT "6 Clicks" Mobility   Outcome Measure  Help needed turning from your back to your side while in a flat bed without using bedrails?: A Little Help needed moving from lying on your back to sitting on the side of a flat bed without using bedrails?: A Little Help needed moving to and from a bed to a chair (including a wheelchair)?: A Lot Help needed standing up from a  chair using your arms (e.g., wheelchair or bedside chair)?: A Lot Help needed to walk in hospital room?: A Lot Help needed climbing 3-5 steps with a railing? : A Lot 6 Click Score: 14    End of Session Equipment Utilized During Treatment: Gait belt Activity Tolerance: Patient tolerated treatment well Patient left: in bed;with bed alarm set;with family/visitor present;with call bell/phone within reach Nurse Communication: Mobility status PT Visit Diagnosis: Unsteadiness on feet (R26.81);Other abnormalities of gait and mobility (R26.89);History of falling (Z91.81);Muscle weakness (generalized) (M62.81);Difficulty in walking, not elsewhere classified (R26.2)     Time: 4782-9562 PT Time Calculation (min) (ACUTE ONLY): 24 min  Charges:    $Therapeutic Activity: 8-22 mins PT General Charges $$ ACUTE PT VISIT: 1 Visit                     Howie Ill, PT, DPT 04/08/23 11:33 AM

## 2023-04-08 NOTE — Progress Notes (Signed)
Progress Note   Patient: Nina Perez UVO:[id number] DOB: Mar 29, 1941 DOA: 04/06/2023     1 DOS: the patient was seen and examined on 04/08/2023   Brief hospital course: 82 year old female past medical history hypertension, pulmonary fibrosis, pulmonary hypertension, rheumatoid arthritis, scleroderma with crest syndrome, type 2 diabetes mellitus, hyperlipidemia, CKD.  She presents with weakness and falls.  Slight burning on urination.  Started on Rocephin for erythema bilateral knees left worse than right. He had a bilateral MRI of the knees, showed a large subcutaneous hematoma in the left knee.  Seen by orthopedic surgery, does not seem to involve the knee joints, may have some secondary cellulitis.  Recommended oral antibiotics. Patient is also evaluated by PT/OT, recommending nursing home placement. Patient also has evidence of urinary tract infection, started on Rocephin.      Principal Problem:   Cellulitis of knee, left Active Problems:   Acute respiratory failure with hypoxia (HCC)   Acute kidney injury superimposed on CKD (HCC)   UTI (urinary tract infection)   Type 2 diabetes mellitus with hyperlipidemia (HCC)   Hypertension   Generalized weakness   History of non-ST elevation myocardial infarction (NSTEMI)   Rheumatoid arthritis (HCC)   Barrett's esophagus   Obesity (BMI 30-39.9)   Hypomagnesemia   Cellulitis of left knee   Hyponatremia   Assessment and Plan: * Cellulitis of knee, left Left knee hematoma. Patient has significant hematoma in the left knee area, no joint involvement.  May have some secondary cellulitis, but condition seem to be improving.  Change antibiotic to oral Augmentin.  Has been seen by orthopedics, no additional intervention is needed.  Acute respiratory failure with hypoxia (HCC) Pulse ox of 85% on room air.   VQ scan and x-ray negative. Condition improved, no additional oxygen requirement.  Chronic kidney disease stage IIIa. Acute kidney  injury ruled out. Mild hyponatremia Mild metabolic acidosis. Hypomagnesemia. Has chronic kidney disease stage IIIa, not meet criteria for acute kidney injury. Sodium level stable, metabolic acidosis resolved.  Supplement magnesium today.  UTI (urinary tract infection) Urine culture has mixed flora, but patient may have had a UTI causing her weakness and fall.  Continue oral antibiotics with Augmentin for additional 3 days.  Type 2 diabetes mellitus with hyperlipidemia (HCC) Last hemoglobin A1c 6.3 patient on sliding scale insulin.  Continue Lipitor.  Generalized weakness Mechanical fall. Patient has no syncope episode.  Continue PT/OT,.  Pending nursing home placement.  Hypertension Continue beta-blocker.  History of non-ST elevation myocardial infarction (NSTEMI) Will continue patient on reduced dose metoprolol, aspirin 81, atorvastatin, Plavix 75.  Rheumatoid arthritis (HCC) Patient with scleroderma with crest syndrome also.  On methotrexate  Barrett's esophagus Continue Protonix  Obesity (BMI 30-39.9) BMI 32.60 with current height and weight in computer.       Subjective:  Patient still has signal weakness, otherwise off oxygen.  Denied any shortness of breath.  Physical Exam: Vitals:   04/08/23 0024 04/08/23 0356 04/08/23 0637 04/08/23 0740  BP: (!) 144/66 (!) 171/87 (!) 145/82 (!) 141/77  Pulse: 63 62  67  Resp: 18 17  16   Temp: 98.1 F (36.7 C) 98.1 F (36.7 C)  98.2 F (36.8 C)  TempSrc:    Oral  SpO2: 92% 97%  95%  Weight:      Height:       General exam: Appears calm and comfortable  Respiratory system: Clear to auscultation. Respiratory effort normal. Cardiovascular system: S1 & S2 heard, RRR. No JVD, murmurs, rubs,  gallops or clicks. No pedal edema. Gastrointestinal system: Abdomen is nondistended, soft and nontender. No organomegaly or masses felt. Normal bowel sounds heard. Central nervous system: Alert and oriented x3. No focal neurological  deficits. Extremities: Symmetric 5 x 5 power. Skin: No rashes, lesions or ulcers Psychiatry: Judgement and insight appear normal. Mood & affect appropriate.    Data Reviewed:  MRI results and lab results reviewed.  Family Communication: Daughter updated at the bedside.  Disposition: Status is: Inpatient Remains inpatient appropriate because: Severity of disease, IV treatment.     Time spent: 35 minutes  Author: Marrion Coy, MD 04/08/2023 11:57 AM  For on call review [email or web address].

## 2023-04-08 NOTE — TOC Initial Note (Signed)
Transition of Care [facility]) - Initial/Assessment Note    Patient Details  Name: Nina Perez MRN: [id number] Date of Birth: October 28, 1940  Transition of Care [facility]) CM/SW Contact:    Allena Katz, LCSW Phone Number: 04/08/2023, 2:24 PM  Clinical Narrative:  Pt admitted with cellulitis from home with husband. Pt is active with Dr Lorin Picket for primary care. Pt reports that she was at [facility] in July and would like to go again. This is her top choice and she does not want to go anywhere else. CSW to start workup. Pt has oxygen through [employer], a ramp and a 3in1. Pt also has a walk in shower.                Expected Discharge Plan: Skilled Nursing Facility Barriers to Discharge: Continued Medical Work up   Patient Goals and CMS Choice Patient states their goals for this hospitalization and ongoing recovery are:: go to [facility] CMS Medicare.gov Compare Post Acute Care list provided to:: Patient Choice offered to / list presented to : Patient      Expected Discharge Plan and Services       Living arrangements for the past 2 months: Single Family Home                                      Prior Living Arrangements/Services Living arrangements for the past 2 months: Single Family Home Lives with:: Spouse          Need for Family Participation in Patient Care: Yes (Comment) Care giver support system in place?: Yes (comment) Current home services: DME    Activities of Daily Living   ADL Screening (condition at time of admission) Independently performs ADLs?: Yes (appropriate for developmental age) Is the patient deaf or have difficulty hearing?: No Does the patient have difficulty seeing, even when wearing glasses/contacts?: Yes Does the patient have difficulty concentrating, remembering, or making decisions?: No  Permission Sought/Granted                  Emotional Assessment     Affect (typically observed): Accepting Orientation: : Oriented to Self, Oriented  to Place, Oriented to  Time, Oriented to Situation      Admission diagnosis:  Generalized weakness [R53.1] Minor head injury, initial encounter [S09.90XA] Urinary tract infection without hematuria, site unspecified [N39.0] Cellulitis of left knee [L03.116] Patient Active Problem List   Diagnosis Date Noted   Hyponatremia 04/08/2023   History of non-ST elevation myocardial infarction (NSTEMI) 04/07/2023   Acute respiratory failure with hypoxia (HCC) 04/07/2023   Cellulitis of knee, left 04/07/2023   Acute kidney injury superimposed on CKD (HCC) 04/07/2023   Cellulitis of left knee 04/07/2023   UTI (urinary tract infection) 04/06/2023   Generalized weakness 04/06/2023   SOB (shortness of breath) on exertion 03/02/2023   Lower extremity cellulitis 03/02/2023   Left hip pain 03/02/2023   Positive self-administered antigen test for COVID-19 01/14/2023   CAD (coronary artery disease) 12/13/2022   Dyslipidemia 11/22/2022   Essential hypertension 11/22/2022   NSTEMI (non-ST elevated myocardial infarction) (HCC) 11/21/2022   Cardiomyopathy (HCC) 11/17/2022   Balance problem 11/17/2022   Anemia in chronic kidney disease 06/24/2022   Congestive heart failure (HCC) 06/24/2022   Idiopathic pulmonary fibrosis (HCC) 06/24/2022   Proteinuria, unspecified 06/24/2022   Ear lesion 04/20/2022   Change in bowel movement 04/20/2022   History of rectal bleeding 11/13/2021  Total knee replacement status 11/13/2021   Knee pain 08/21/2021   Occult blood positive stool 07/04/2021   Rectal bleeding 06/26/2021   Redness of skin 04/09/2021   Aortic atherosclerosis (HCC) 01/20/2021   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   Nocturia 12/18/2020   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) 10/08/2020   Abnormal ECG 08/06/2020   Premature beats 07/03/2020   Dysuria 12/11/2019   Adenomatous colon polyp 11/21/2019   Phlebitis 11/21/2019   Renal cyst 11/21/2019   Swelling of lower extremity 10/26/2019    Hypomagnesemia 06/03/2019   CKD (chronic kidney disease) stage 3, GFR 30-59 ml/min (HCC) 06/02/2019   Atherosclerosis of native arteries of extremity with rest pain (HCC) 05/29/2019   Critical lower limb ischemia (HCC) 05/16/2019   Open wound of left foot 03/27/2019   Decreased GFR 11/13/2018   Bladder incontinence 11/13/2018   Elevated TSH 11/13/2018   Thrombocytopenia (HCC) 07/03/2018   Acute right-sided thoracic back pain 06/16/2018   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   Pulmonary hypertension, mild (HCC) 03/04/2018   Murmur 08/16/2017   Lymphedema 03/04/2016   Lesion of neck 12/15/2015   Dizziness 12/15/2015   Venous stasis dermatitis of right lower extremity 09/10/2015   Varicose veins of right lower extremity with inflammation 09/10/2015   Breast nodule 11/05/2014   Health care maintenance 11/02/2014   Obesity (BMI 30-39.9) 06/04/2014   Osteoarthritis 02/27/2014   Environmental allergies 01/15/2014   Type 2 diabetes mellitus with hyperlipidemia (HCC) 05/22/2012   Hypertension 05/22/2012   Hypercholesterolemia 05/22/2012   Rheumatoid arthritis (HCC) 05/22/2012   Barrett's esophagus 05/22/2012   Gout 05/22/2012   GERD (gastroesophageal reflux disease) 05/22/2012   History of colon polyps 05/22/2012   Type 2 diabetes mellitus without complications (HCC) 05/22/2012   PCP:  Dale Bloomingdale, MD Pharmacy:   [facility] [address] [place] - [address] [place] [address] Phone: [phone] Fax: [phone]  [facility] - [place], [place] - [address] [place] [address] Phone: [phone] Fax: [phone]  [facility] [address] [place] [address] Phone: [phone] Fax: [phone]     Social Determinants of Health (SDOH) Social History: SDOH Screenings   Food Insecurity: No Food Insecurity (04/07/2023)  Housing: Low  Risk  (04/07/2023)  Transportation Needs: No Transportation Needs (04/07/2023)  Utilities: Not At Risk (04/07/2023)  Alcohol Screen: Low Risk  (01/15/2023)  Depression (PHQ2-9): Low Risk  (02/04/2023)  Financial Resource Strain: Low Risk  (01/15/2023)  Physical Activity: Unknown (01/15/2023)  Social Connections: Moderately Integrated (01/15/2023)  Stress: No Stress Concern Present (01/15/2023)  Tobacco Use: Medium Risk (04/06/2023)  Health Literacy: Adequate Health Literacy (01/15/2023)   SDOH Interventions:     Readmission Risk Interventions    04/08/2023    2:23 PM  Readmission Risk Prevention Plan  Transportation Screening Complete  PCP or Specialist Appt within 3-5 Days Complete  HRI or Home Care Consult Complete  Medication Review (RN Care Manager) Complete

## 2023-04-08 NOTE — Progress Notes (Signed)
Patient ID: Nina Perez, female   DOB: March 16, 1941, 82 y.o.   MRN: [id number]  Subjective: The patient continues to complain of moderate pain in her left knee, especially with any weightbearing.  Otherwise, she has no new complaints pertaining to the left knee.  She remains afebrile.   Objective: Vital signs in last 24 hours: Temp:  [97.9 F (36.6 C)-98.3 F (36.8 C)] 98.2 F (36.8 C) (11/27 0740) Pulse Rate:  [58-67] 67 (11/27 0740) Resp:  [16-18] 16 (11/27 0740) BP: (141-171)/(66-87) 141/77 (11/27 0740) SpO2:  [92 %-98 %] 95 % (11/27 0740)  Intake/Output from previous day: 11/26 0701 - 11/27 0700 In: 296.7 [P.O.:150; IV Piggyback:146.7] Out: -  Intake/Output this shift: Total I/O In: 240 [P.O.:240] Out: -   Recent Labs    04/06/23 1514 04/07/23 0605 04/08/23 0441  HGB 11.9* 10.4* 10.6*   Recent Labs    04/07/23 0605 04/08/23 0441  WBC 4.2 5.8  RBC 3.20* 3.22*  HCT 32.5* 32.0*  PLT 204 192   Recent Labs    04/07/23 0605 04/08/23 0441  NA 136 134*  K 4.4 4.5  CL 106 106  CO2 21* 24  BUN 42* 25*  CREATININE 1.65* 1.07*  GLUCOSE 86 79  CALCIUM 9.2 9.4   No results for input(s): "LABPT", "INR" in the last 72 hours.  Physical Exam: Orthopedic examination again is limited to the left knee.  Overall, the findings are as noted yesterday.  There might be slight improvement in the swelling and erythema as compared to yesterday.  She again is able to flex and extend her knee with only minimal discomfort.  She remains grossly neurovascularly intact to the left lower extremity and foot.  Examination of the right knee also is unchanged as compared to yesterday.  She is able to flex and extend her knee without pain.  She remains grossly neurovascularly intact to the right lower extremity and foot.  Assessment: 1.  Superficial abrasion anterior left knee with surrounding cellulitis. 2.  Subcutaneous hematoma anterior left knee. 3.  Superficial abrasion anterior right  knee status post prior right total knee arthroplasty.  Plan: The treatment options have been reviewed with the patient and her husband, who is in the room.  The patient should complete her course of oral antibiotics.  In addition, she will continue to have the mupirocin applied 2 times daily, then covered by dry dressing.  She may continue to be mobilized with physical therapy.  I agree with the proposed plan for transfer to rehab when a bed is available.  Thank you for asking me to participate in the care of this most delightful woman.  I will sign off at this time.  Please make arrangements for her to follow-up in the office with one of our PAs in the next 2 to 3 weeks.  If you have further need of orthopedic input during this hospitalization, please reconsult me.   Excell Seltzer Almadelia Looman 04/08/2023, 1:37 PM

## 2023-04-08 NOTE — NC FL2 (Signed)
[place] MEDICAID FL2 LEVEL OF CARE FORM     IDENTIFICATION  Patient Name: Nina Perez Birthdate: 1940/08/07 Sex: female Admission Date (Current Location): 04/06/2023  [name] and [place] Number:  Chiropodist and Address:  [facility], [address] [place], [place] [address]      Provider Number: [id number]  Attending Physician Name and Address:  Marrion Coy, MD  Relative Name and Phone Number:  Nina, Perez (Spouse)  [phone]    Current Level of Care: Hospital Recommended Level of Care: Skilled Nursing Facility Prior Approval Number:    Date Approved/Denied:   PASRR Number: [id number]  Discharge Plan: SNF    Current Diagnoses: Patient Active Problem List   Diagnosis Date Noted   Hyponatremia 04/08/2023   History of non-ST elevation myocardial infarction (NSTEMI) 04/07/2023   Acute respiratory failure with hypoxia (HCC) 04/07/2023   Cellulitis of knee, left 04/07/2023   Acute kidney injury superimposed on CKD (HCC) 04/07/2023   Cellulitis of left knee 04/07/2023   UTI (urinary tract infection) 04/06/2023   Generalized weakness 04/06/2023   SOB (shortness of breath) on exertion 03/02/2023   Lower extremity cellulitis 03/02/2023   Left hip pain 03/02/2023   Positive self-administered antigen test for COVID-19 01/14/2023   CAD (coronary artery disease) 12/13/2022   Dyslipidemia 11/22/2022   Essential hypertension 11/22/2022   NSTEMI (non-ST elevated myocardial infarction) (HCC) 11/21/2022   Cardiomyopathy (HCC) 11/17/2022   Balance problem 11/17/2022   Anemia in chronic kidney disease 06/24/2022   Congestive heart failure (HCC) 06/24/2022   Idiopathic pulmonary fibrosis (HCC) 06/24/2022   Proteinuria, unspecified 06/24/2022   Ear lesion 04/20/2022   Change in bowel movement 04/20/2022   History of rectal bleeding 11/13/2021   Total knee replacement status 11/13/2021   Knee pain 08/21/2021   Occult blood positive  stool 07/04/2021   Rectal bleeding 06/26/2021   Redness of skin 04/09/2021   Aortic atherosclerosis (HCC) 01/20/2021   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   Nocturia 12/18/2020   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) 10/08/2020   Abnormal ECG 08/06/2020   Premature beats 07/03/2020   Dysuria 12/11/2019   Adenomatous colon polyp 11/21/2019   Phlebitis 11/21/2019   Renal cyst 11/21/2019   Swelling of lower extremity 10/26/2019   Hypomagnesemia 06/03/2019   CKD (chronic kidney disease) stage 3, GFR 30-59 ml/min (HCC) 06/02/2019   Atherosclerosis of native arteries of extremity with rest pain (HCC) 05/29/2019   Critical lower limb ischemia (HCC) 05/16/2019   Open wound of left foot 03/27/2019   Decreased GFR 11/13/2018   Bladder incontinence 11/13/2018   Elevated TSH 11/13/2018   Thrombocytopenia (HCC) 07/03/2018   Acute right-sided thoracic back pain 06/16/2018   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   Pulmonary hypertension, mild (HCC) 03/04/2018   Murmur 08/16/2017   Lymphedema 03/04/2016   Lesion of neck 12/15/2015   Dizziness 12/15/2015   Venous stasis dermatitis of right lower extremity 09/10/2015   Varicose veins of right lower extremity with inflammation 09/10/2015   Breast nodule 11/05/2014   Health care maintenance 11/02/2014   Obesity (BMI 30-39.9) 06/04/2014   Osteoarthritis 02/27/2014   Environmental allergies 01/15/2014   Type 2 diabetes mellitus with hyperlipidemia (HCC) 05/22/2012   Hypertension 05/22/2012   Hypercholesterolemia 05/22/2012   Rheumatoid arthritis (HCC) 05/22/2012   Barrett's esophagus 05/22/2012   Gout 05/22/2012   GERD (gastroesophageal reflux disease) 05/22/2012   History of colon polyps 05/22/2012   Type 2 diabetes mellitus without complications (HCC) 05/22/2012  Orientation RESPIRATION BLADDER Height & Weight     Self, Time, Situation, Place  O2 Incontinent Weight: 202 lb (91.6 kg) Height:  5\' 6"  (167.6 cm)  BEHAVIORAL  SYMPTOMS/MOOD NEUROLOGICAL BOWEL NUTRITION STATUS      Incontinent    AMBULATORY STATUS COMMUNICATION OF NEEDS Skin   Extensive Assist Verbally Normal                       Personal Care Assistance Level of Assistance  Bathing, Feeding, Dressing Bathing Assistance: Maximum assistance Feeding assistance: Limited assistance Dressing Assistance: Maximum assistance     Functional Limitations Info  Sight, Hearing, Speech Sight Info: Impaired Hearing Info: Adequate Speech Info: Adequate    SPECIAL CARE FACTORS FREQUENCY  PT (By licensed PT), OT (By licensed OT)     PT Frequency: 5 times a week OT Frequency: 5 times a week            Contractures Contractures Info: Not present    Additional Factors Info  Code Status, Allergies Code Status Info: FULL Allergies Info: Ethanol  Tape  Triamterene-hctz  Cephalexin  Hydrochlorothiazide W-triamterene           Current Medications (04/08/2023):  This is the current hospital active medication list Current Facility-Administered Medications  Medication Dose Route Frequency Provider Last Rate Last Admin   acetaminophen (TYLENOL) tablet 650 mg  650 mg Oral Q6H PRN Gertha Calkin, MD   650 mg at 04/08/23 0802   Or   acetaminophen (TYLENOL) suppository 650 mg  650 mg Rectal Q6H PRN Gertha Calkin, MD       allopurinol (ZYLOPRIM) tablet 100 mg  100 mg Oral Daily Irena Cords V, MD   100 mg at 04/08/23 0802   [START ON 04/09/2023] amoxicillin-clavulanate (AUGMENTIN) 875-125 MG per tablet 1 tablet  1 tablet Oral Q12H Marrion Coy, MD       atorvastatin (LIPITOR) tablet 40 mg  40 mg Oral Daily Irena Cords V, MD   40 mg at 04/08/23 0802   clopidogrel (PLAVIX) tablet 75 mg  75 mg Oral Q breakfast Irena Cords V, MD   75 mg at 04/08/23 0802   heparin injection 5,000 Units  5,000 Units Subcutaneous Q12H Jawo, Modou L, NP   5,000 Units at 04/08/23 1018   HYDROcodone-acetaminophen (NORCO/VICODIN) 5-325 MG per tablet 1 tablet  1 tablet Oral Q4H  PRN Gertha Calkin, MD   1 tablet at 04/08/23 1021   methotrexate (RHEUMATREX) tablet 10 mg  10 mg Oral Q Wed Patel, Ekta V, MD   10 mg at 04/08/23 0802   morphine (PF) 2 MG/ML injection 2 mg  2 mg Intravenous Q4H PRN Gertha Calkin, MD       mupirocin cream (BACTROBAN) 2 %   Topical BID Poggi, Excell Seltzer, MD   Given at 04/08/23 2130   nitroGLYCERIN (NITROSTAT) SL tablet 0.4 mg  0.4 mg Sublingual Q5 Min x 3 PRN Gertha Calkin, MD       pantoprazole (PROTONIX) injection 40 mg  40 mg Intravenous Q12H Irena Cords V, MD   40 mg at 04/08/23 0805   sodium chloride flush (NS) 0.9 % injection 3 mL  3 mL Intravenous Q12H Gertha Calkin, MD   3 mL at 04/08/23 0805     Discharge Medications: Please see discharge summary for a list of discharge medications.  Relevant Imaging Results:  Relevant Lab Results:   Additional Information SSN:[id number]  Allena Katz, LCSW

## 2023-04-09 DIAGNOSIS — N179 Acute kidney failure, unspecified: Secondary | ICD-10-CM | POA: Diagnosis not present

## 2023-04-09 DIAGNOSIS — L03116 Cellulitis of left lower limb: Secondary | ICD-10-CM | POA: Diagnosis not present

## 2023-04-09 DIAGNOSIS — J9601 Acute respiratory failure with hypoxia: Secondary | ICD-10-CM | POA: Diagnosis not present

## 2023-04-09 DIAGNOSIS — N3000 Acute cystitis without hematuria: Secondary | ICD-10-CM | POA: Diagnosis not present

## 2023-04-09 MED ORDER — OXYCODONE-ACETAMINOPHEN 5-325 MG PO TABS
1.0000 | ORAL_TABLET | ORAL | Status: DC | PRN
  Administered 2023-04-09 – 2023-04-13 (×9): 1 via ORAL
  Filled 2023-04-09 (×10): qty 1

## 2023-04-09 MED ORDER — AMLODIPINE BESYLATE 5 MG PO TABS
5.0000 mg | ORAL_TABLET | Freq: Every day | ORAL | Status: DC
  Administered 2023-04-09 – 2023-04-13 (×5): 5 mg via ORAL
  Filled 2023-04-09 (×5): qty 1

## 2023-04-09 NOTE — Progress Notes (Signed)
Progress Note   Patient: Nina Perez FAO:[id number] DOB: Dec 19, 1940 DOA: 04/06/2023     2 DOS: the patient was seen and examined on 04/09/2023   Brief hospital course: 82 year old female past medical history hypertension, pulmonary fibrosis, pulmonary hypertension, rheumatoid arthritis, scleroderma with crest syndrome, type 2 diabetes mellitus, hyperlipidemia, CKD.  She presents with weakness and falls.  Slight burning on urination.  Started on Rocephin for erythema bilateral knees left worse than right. He had a bilateral MRI of the knees, showed a large subcutaneous hematoma in the left knee.  Seen by orthopedic surgery, does not seem to involve the knee joints, may have some secondary cellulitis.  Recommended oral antibiotics. Patient is also evaluated by PT/OT, recommending nursing home placement. Patient also has evidence of urinary tract infection, started on Rocephin.      Principal Problem:   Cellulitis of knee, left Active Problems:   Acute respiratory failure with hypoxia (HCC)   Acute kidney injury superimposed on CKD (HCC)   UTI (urinary tract infection)   Type 2 diabetes mellitus with hyperlipidemia (HCC)   Hypertension   Generalized weakness   History of non-ST elevation myocardial infarction (NSTEMI)   Rheumatoid arthritis (HCC)   Barrett's esophagus   Obesity (BMI 30-39.9)   Hypomagnesemia   Cellulitis of left knee   Hyponatremia   Assessment and Plan: * Cellulitis of knee, left Left knee hematoma. Patient has significant hematoma in the left knee area, no joint involvement.  May have some secondary cellulitis, but condition seem to be improving.  Change antibiotic to oral Augmentin.  Has been seen by orthopedics, no additional intervention is needed. Left knee swelling is better.   Acute respiratory failure with hypoxia (HCC) Pulse ox of 85% on room air.   VQ scan negative. Condition improved, no additional oxygen requirement.   Chronic kidney disease  stage IIIa. Acute kidney injury ruled out. Mild hyponatremia Mild metabolic acidosis. Hypomagnesemia. Has chronic kidney disease stage IIIa, not meet criteria for acute kidney injury. Renal function is actually better than baseline.  Magnesium normalized.   UTI (urinary tract infection) Urine culture has mixed flora, but patient may have had a UTI causing her weakness and fall.  Continued oral antibiotics with Augmentin for additional 3 days.   Type 2 diabetes mellitus with hyperlipidemia (HCC) Last hemoglobin A1c 6.3 patient on sliding scale insulin.  Continue Lipitor.   Generalized weakness Mechanical fall. Patient has no syncope episode.  Continue PT/OT,.  Pending nursing home placement.   Hypertension Continue beta-blocker.   History of non-ST elevation myocardial infarction (NSTEMI) Will continue patient on reduced dose metoprolol, aspirin 81, atorvastatin, Plavix 75.   Rheumatoid arthritis (HCC) Patient with scleroderma with crest syndrome also.  On methotrexate   Barrett's esophagus Continue Protonix   Obesity (BMI 30-39.9) BMI 32.60 with current height and weight in computer.            Subjective:  Patient doing well today, no shortness of breath.  Slept better.  Physical Exam: Vitals:   04/08/23 2023 04/09/23 0500 04/09/23 0555 04/09/23 0747  BP: (!) 142/63  (!) 181/86 (!) 158/102  Pulse: 76  72 74  Resp: 18  20   Temp: 99.1 F (37.3 C)  98.3 F (36.8 C) 98.1 F (36.7 C)  TempSrc:   Oral Oral  SpO2: 93%  96% 95%  Weight:  88.8 kg    Height:       General exam: Appears calm and comfortable, obese. Respiratory system: Clear to  auscultation. Respiratory effort normal. Cardiovascular system: S1 & S2 heard, RRR. No JVD, murmurs, rubs, gallops or clicks. No pedal edema. Gastrointestinal system: Abdomen is nondistended, soft and nontender. No organomegaly or masses felt. Normal bowel sounds heard. Central nervous system: Alert and oriented. No focal  neurological deficits. Extremities: Symmetric 5 x 5 power. Skin: No rashes, lesions or ulcers Psychiatry: Judgement and insight appear normal. Mood & affect appropriate.    Data Reviewed:  Lab results reviewed.  Family Communication: None  Disposition: Status is: Inpatient Remains inpatient appropriate because: Severity of disease, unsafe discharge.     Time spent: 35 minutes  Author: Marrion Coy, MD 04/09/2023 12:51 PM  For on call review [email or web address].

## 2023-04-10 DIAGNOSIS — J9601 Acute respiratory failure with hypoxia: Secondary | ICD-10-CM | POA: Diagnosis not present

## 2023-04-10 DIAGNOSIS — N189 Chronic kidney disease, unspecified: Secondary | ICD-10-CM | POA: Diagnosis not present

## 2023-04-10 DIAGNOSIS — L03116 Cellulitis of left lower limb: Secondary | ICD-10-CM | POA: Diagnosis not present

## 2023-04-10 DIAGNOSIS — N179 Acute kidney failure, unspecified: Secondary | ICD-10-CM | POA: Diagnosis not present

## 2023-04-10 MED ORDER — FOLIC ACID 1 MG PO TABS
1.0000 mg | ORAL_TABLET | Freq: Every evening | ORAL | Status: DC
  Administered 2023-04-10 – 2023-04-12 (×3): 1 mg via ORAL
  Filled 2023-04-10 (×3): qty 1

## 2023-04-10 MED ORDER — PANTOPRAZOLE SODIUM 40 MG PO TBEC
40.0000 mg | DELAYED_RELEASE_TABLET | Freq: Two times a day (BID) | ORAL | Status: DC
  Administered 2023-04-10 – 2023-04-13 (×7): 40 mg via ORAL
  Filled 2023-04-10 (×7): qty 1

## 2023-04-10 NOTE — Plan of Care (Signed)

## 2023-04-10 NOTE — Progress Notes (Signed)
Physical Therapy Treatment Patient Details Name: ROCHELL EWBANK MRN: [id number] DOB: 1941-04-29 Today's Date: 04/10/2023   History of Present Illness LAVANNA RAUDA is a 82 y.o. female with medical history significant for congestive heart failure, hypertension type II, GERD hypercholesterolemia, osteoarthritis allergies to ethanol, type adhesives, triamterene-HCTZ, Keflex, HCTZ came to the emergency room today with gradual onset of generalized weakness with past few days patient on antibiotic regimen for cellulitis of her left knee she was also recently treated for urinary tract infection. Chest x-ray negative. Will get orthopedic consultation.    PT Comments  Therapist in several times today to assist with rolling for bed pan and application of ice to L knee prior to session. Pt pre-medicated prior to session. ModA to transfer to EOB with HOB raised and use of rails. No c/o dizziness at EOB, pt very fearful of falling due to several falls at home. Sit<>stand x 2 with MaxA at EOB, unable to attain full upright standing and advance to bedside chair, therefore utilized Corene Cornea with nursing to complete 2 more standing trials prior to placing to comfort in recliner. Noted on communication board to use lift for bed<>chair until strength improves and pain decreased. Pt awaiting STR at [facility] once medically cleared for d/c.    If plan is discharge home, recommend the following: A lot of help with walking and/or transfers;A lot of help with bathing/dressing/bathroom;Assist for transportation;Help with stairs or ramp for entrance   Can travel by private vehicle     No  Equipment Recommendations  Other (comment) (TBD at next level of care)    Recommendations for Other Services       Precautions / Restrictions Precautions Precautions: Fall Restrictions Weight Bearing Restrictions: Yes LLE Weight Bearing: Weight bearing as tolerated Other Position/Activity Restrictions: per Ortho Note      Mobility  Bed Mobility Overal bed mobility: Needs Assistance Bed Mobility: Supine to Sit     Supine to sit: Mod assist, Used rails, HOB elevated     General bed mobility comments: Increased time, pt assisted to EOB via linen pull    Transfers Overall transfer level: Needs assistance Equipment used: Rolling walker (2 wheels) Transfers: Sit to/from Stand Sit to Stand: Max assist, From elevated surface           General transfer comment: STS at RW x 2 with MaxA, pt unable to transition to upright standing to safely progress to bedside chair, therefore transferred with nursing and use of Corene Cornea Transfer via Lift Equipment: Antony Salmon  Ambulation/Gait               General Gait Details: Unable   Stairs             Wheelchair Mobility     Tilt Bed    Modified Rankin (Stroke Patients Only)       Balance Overall balance assessment: Needs assistance Sitting-balance support: Feet supported, Bilateral upper extremity supported Sitting balance-Leahy Scale: Fair Sitting balance - Comments:  (Pt leaning to Right side requiring assist to correct) Postural control: Right lateral lean Standing balance support: Reliant on assistive device for balance, During functional activity, Bilateral upper extremity supported Standing balance-Leahy Scale: Poor                              Cognition Arousal: Alert Behavior During Therapy: WFL for tasks assessed/performed Overall Cognitive Status: Within Functional Limits for tasks assessed  General Comments: Fear of falling limiting mobility progression        Exercises General Exercises - Lower Extremity Ankle Circles/Pumps: AROM, Both, 10 reps, Supine Heel Slides: AROM, Right, 5 reps, Supine Hip ABduction/ADduction: AROM, Right, AAROM, Left, 5 reps, Supine    General Comments General comments (skin integrity, edema, etc.): Pt pre-medicated and received ice  to Left knee prior to session. Pt and husband educated on role of PT and benefits for increasing mobility      Pertinent Vitals/Pain Pain Assessment Pain Assessment: 0-10 Pain Score: 5  Pain Location: L Knee Pain Descriptors / Indicators: Aching, Tender, Sore Pain Intervention(s): Premedicated before session, Ice applied    Home Living                          Prior Function            PT Goals (current goals can now be found in the care plan section) Acute Rehab PT Goals Patient Stated Goal: Get Rid of Pain in Knee    Frequency    Min 1X/week      PT Plan      Co-evaluation              AM-PAC PT "6 Clicks" Mobility   Outcome Measure  Help needed turning from your back to your side while in a flat bed without using bedrails?: A Little Help needed moving from lying on your back to sitting on the side of a flat bed without using bedrails?: A Little Help needed moving to and from a bed to a chair (including a wheelchair)?: A Lot Help needed standing up from a chair using your arms (e.g., wheelchair or bedside chair)?: A Lot Help needed to walk in hospital room?: A Lot Help needed climbing 3-5 steps with a railing? : A Lot 6 Click Score: 14    End of Session Equipment Utilized During Treatment: Gait belt Activity Tolerance: Patient tolerated treatment well Patient left: in chair;with call bell/phone within reach;with chair alarm set;with family/visitor present Nurse Communication: Mobility status PT Visit Diagnosis: Unsteadiness on feet (R26.81);Other abnormalities of gait and mobility (R26.89);History of falling (Z91.81);Muscle weakness (generalized) (M62.81);Difficulty in walking, not elsewhere classified (R26.2)     Time: 4696-2952 PT Time Calculation (min) (ACUTE ONLY): 39 min  Charges:    $Therapeutic Exercise: 8-22 mins $Therapeutic Activity: 23-37 mins PT General Charges $$ ACUTE PT VISIT: 1 Visit                    Zadie Cleverly,  PTA  Jannet Askew 04/10/2023, 4:00 PM

## 2023-04-10 NOTE — Progress Notes (Signed)
Progress Note   Patient: Nina Perez BJY:[id number] DOB: May 15, 1940 DOA: 04/06/2023     3 DOS: the patient was seen and examined on 04/10/2023   Brief hospital course: 82 year old female past medical history hypertension, pulmonary fibrosis, pulmonary hypertension, rheumatoid arthritis, scleroderma with crest syndrome, type 2 diabetes mellitus, hyperlipidemia, CKD.  She presents with weakness and falls.  Slight burning on urination.  Started on Rocephin for erythema bilateral knees left worse than right. He had a bilateral MRI of the knees, showed a large subcutaneous hematoma in the left knee.  Seen by orthopedic surgery, does not seem to involve the knee joints, may have some secondary cellulitis.  Recommended oral antibiotics. Patient is also evaluated by PT/OT, recommending nursing home placement. Patient also has evidence of urinary tract infection, started on Rocephin.      Principal Problem:   Cellulitis of knee, left Active Problems:   Acute respiratory failure with hypoxia (HCC)   Acute kidney injury superimposed on CKD (HCC)   UTI (urinary tract infection)   Type 2 diabetes mellitus with hyperlipidemia (HCC)   Hypertension   Generalized weakness   History of non-ST elevation myocardial infarction (NSTEMI)   Rheumatoid arthritis (HCC)   Barrett's esophagus   Obesity (BMI 30-39.9)   Hypomagnesemia   Cellulitis of left knee   Hyponatremia   Assessment and Plan: * Cellulitis of knee, left Left knee hematoma. Patient has significant hematoma in the left knee area, no joint involvement.  May have some secondary cellulitis, but condition seem to be improving.  Change antibiotic to oral Augmentin.  Has been seen by orthopedics, no additional intervention is needed. Left knee swelling is better.   Acute respiratory failure with hypoxia (HCC) Pulse ox of 85% on room air.   VQ scan negative. Condition improved, no additional oxygen requirement.   Chronic kidney disease  stage IIIa. Acute kidney injury ruled out. Mild hyponatremia Mild metabolic acidosis. Hypomagnesemia. Has chronic kidney disease stage IIIa, not meet criteria for acute kidney injury. Renal function is actually better than baseline.  Magnesium normalized.   UTI (urinary tract infection) Urine culture has mixed flora, but patient may have had a UTI causing her weakness and fall.  Continued oral antibiotics with Augmentin for additional 3 days.   Type 2 diabetes mellitus with hyperlipidemia (HCC) Last hemoglobin A1c 6.3 patient on sliding scale insulin.  Continue Lipitor.   Generalized weakness Mechanical fall. Patient has no syncope episode.  Continue PT/OT,.  Pending nursing home placement.   Hypertension Continue beta-blocker.   History of non-ST elevation myocardial infarction (NSTEMI) Will continue patient on reduced dose metoprolol, aspirin 81, atorvastatin, Plavix 75.   Rheumatoid arthritis (HCC) Patient with scleroderma with crest syndrome also.  On methotrexate   Barrett's esophagus Continue Protonix   Obesity (BMI 30-39.9) BMI 32.60 with current height and weight in computer.     Condition has been stabilized, currently pending nursing placement.  No change in treatment plan today.     Subjective:  Patient doing well today, no complaint.  Slept well.  Had a normal bowel movement.  Physical Exam: Vitals:   04/09/23 1531 04/09/23 2013 04/10/23 0404 04/10/23 0816  BP: 136/84 (!) 157/89 (!) 171/76 139/68  Pulse: 92 94 82 79  Resp:  18  19  Temp: 98.6 F (37 C) 99.6 F (37.6 C) 98.4 F (36.9 C) 98.5 F (36.9 C)  TempSrc:      SpO2: 95% 93% 95% 91%  Weight:      Height:  General exam: Appears calm and comfortable  Respiratory system: Clear to auscultation. Respiratory effort normal. Cardiovascular system: S1 & S2 heard, RRR. No JVD, murmurs, rubs, gallops or clicks. No pedal edema. Gastrointestinal system: Abdomen is nondistended, soft and  nontender. No organomegaly or masses felt. Normal bowel sounds heard. Central nervous system: Alert and oriented. No focal neurological deficits. Extremities: Symmetric 5 x 5 power. Skin: No rashes, lesions or ulcers Psychiatry: Judgement and insight appear normal. Mood & affect appropriate.    Data Reviewed:  There are no new results to review at this time.  Family Communication: None  Disposition: Status is: Inpatient Remains inpatient appropriate because: Unsafe discharge.     Time spent: 25 minutes  Author: Marrion Coy, MD 04/10/2023 1:22 PM  For on call review [email or web address].

## 2023-04-11 DIAGNOSIS — N3000 Acute cystitis without hematuria: Secondary | ICD-10-CM | POA: Diagnosis not present

## 2023-04-11 DIAGNOSIS — L03116 Cellulitis of left lower limb: Secondary | ICD-10-CM | POA: Diagnosis not present

## 2023-04-11 DIAGNOSIS — J9601 Acute respiratory failure with hypoxia: Secondary | ICD-10-CM | POA: Diagnosis not present

## 2023-04-11 DIAGNOSIS — N179 Acute kidney failure, unspecified: Secondary | ICD-10-CM | POA: Diagnosis not present

## 2023-04-11 NOTE — Progress Notes (Signed)
Physical Therapy Treatment Patient Details Name: Nina Perez MRN: [id number] DOB: 08-15-1940 Today's Date: 04/11/2023   History of Present Illness Nina Perez is a 82 y.o. female with medical history significant for congestive heart failure, hypertension type II, GERD hypercholesterolemia, osteoarthritis allergies to ethanol, type adhesives, triamterene-HCTZ, Keflex, HCTZ came to the emergency room today with gradual onset of generalized weakness with past few days patient on antibiotic regimen for cellulitis of her left knee she was also recently treated for urinary tract infection. Chest x-ray negative. Will get orthopedic consultation.    PT Comments  Pt was long sitting in bed upon arrival. She is A and O x 4. Agreeable to PT session and remained cooperative throughout. Premedicated prior to session but still  endorses 5/10 pain. Pt does have a fear of falling that greatly restricts session progression. Pt did not want to attempt BSC but did successfully urinate on bedpan prior to getting OOB. Pt stood 2 x from elevated bed height prior to stand pivot to recliner with max assist of one. Pt was repositioned in recliner with ice pack applied to L knee. Pt is progressing but slowly. She will continue to benefit form skilled PT to maximize independence while decreasing caregiver burden.    If plan is discharge home, recommend the following: A lot of help with walking and/or transfers;A lot of help with bathing/dressing/bathroom;Assist for transportation;Help with stairs or ramp for entrance     Equipment Recommendations  Other (comment) (Defer to next level of care)       Precautions / Restrictions Precautions Precautions: Fall Restrictions Weight Bearing Restrictions: Yes LLE Weight Bearing: Weight bearing as tolerated     Mobility  Bed Mobility Overal bed mobility: Needs Assistance Bed Mobility: Rolling, Supine to Sit Rolling: Min assist, Mod assist (To place/ removed bed pan) Supine  to sit: Mod assist, Used rails, HOB elevated   Transfers Overall transfer level: Needs assistance Equipment used: Rolling walker (2 wheels) Transfers: Sit to/from Stand Sit to Stand: Min assist, Mod assist, From elevated surface  General transfer comment: pt stood 2 x EOB then stand pivot to recliner with max of one. pt struggles tolerating wt on LLE to advance RLE    Ambulation/Gait  General Gait Details: unable but was able to clear LLE but unable to clear RLE due to pain in wt bearing on LLE   Balance Overall balance assessment: Needs assistance Sitting-balance support: Feet supported, Bilateral upper extremity supported Sitting balance-Leahy Scale: Fair     Standing balance support: Reliant on assistive device for balance, Bilateral upper extremity supported, During functional activity Standing balance-Leahy Scale: Poor    Cognition Arousal: Alert Behavior During Therapy: WFL for tasks assessed/performed Overall Cognitive Status: Within Functional Limits for tasks assessed    General Comments: pt is A and O x 4. does voice anxiety about falling however puts forth great effort throughout.               Pertinent Vitals/Pain Pain Assessment Pain Assessment: 0-10 Pain Score: 5  Pain Location: L Knee Pain Descriptors / Indicators: Aching, Tender, Sore Pain Intervention(s): Limited activity within patient's tolerance, Monitored during session, Premedicated before session, Repositioned, Ice applied     PT Goals (current goals can now be found in the care plan section) Acute Rehab PT Goals Patient Stated Goal: go to [facility] then home Progress towards PT goals: Progressing toward goals    Frequency    Min 1X/week  Co-evaluation     PT goals addressed during session: Mobility/safety with mobility;Balance;Strengthening/ROM;Proper use of DME        AM-PAC PT "6 Clicks" Mobility   Outcome Measure  Help needed turning from your back to your side  while in a flat bed without using bedrails?: A Little Help needed moving from lying on your back to sitting on the side of a flat bed without using bedrails?: A Lot Help needed moving to and from a bed to a chair (including a wheelchair)?: A Lot Help needed standing up from a chair using your arms (e.g., wheelchair or bedside chair)?: A Lot Help needed to walk in hospital room?: Total Help needed climbing 3-5 steps with a railing? : Total 6 Click Score: 11    End of Session   Activity Tolerance: Patient tolerated treatment well Patient left: in chair;with call bell/phone within reach;with chair alarm set;with family/visitor present Nurse Communication: Mobility status PT Visit Diagnosis: Unsteadiness on feet (R26.81);Other abnormalities of gait and mobility (R26.89);History of falling (Z91.81);Muscle weakness (generalized) (M62.81);Difficulty in walking, not elsewhere classified (R26.2)     Time: 1050-1107 PT Time Calculation (min) (ACUTE ONLY): 17 min  Charges:    $Therapeutic Activity: 8-22 mins PT General Charges $$ ACUTE PT VISIT: 1 Visit                     Jetta Lout PTA 04/11/23, 11:31 AM

## 2023-04-11 NOTE — Plan of Care (Signed)

## 2023-04-11 NOTE — Progress Notes (Signed)
Progress Note   Patient: Nina Perez WJX:[id number] DOB: 03-Apr-1941 DOA: 04/06/2023     4 DOS: the patient was seen and examined on 04/11/2023   Brief hospital course: 82 year old female past medical history hypertension, pulmonary fibrosis, pulmonary hypertension, rheumatoid arthritis, scleroderma with crest syndrome, type 2 diabetes mellitus, hyperlipidemia, CKD.  She presents with weakness and falls.  Slight burning on urination.  Started on Rocephin for erythema bilateral knees left worse than right. He had a bilateral MRI of the knees, showed a large subcutaneous hematoma in the left knee.  Seen by orthopedic surgery, does not seem to involve the knee joints, may have some secondary cellulitis.  Recommended oral antibiotics. Patient is also evaluated by PT/OT, recommending nursing home placement. Patient also has evidence of urinary tract infection, started on Rocephin.      Principal Problem:   Cellulitis of knee, left Active Problems:   Acute respiratory failure with hypoxia (HCC)   Acute kidney injury superimposed on CKD (HCC)   UTI (urinary tract infection)   Type 2 diabetes mellitus with hyperlipidemia (HCC)   Hypertension   Generalized weakness   History of non-ST elevation myocardial infarction (NSTEMI)   Rheumatoid arthritis (HCC)   Barrett's esophagus   Obesity (BMI 30-39.9)   Hypomagnesemia   Cellulitis of left knee   Hyponatremia   Assessment and Plan: * Cellulitis of knee, left Left knee hematoma. Patient has significant hematoma in the left knee area, no joint involvement.  May have some secondary cellulitis, but condition seem to be improving.  Change antibiotic to oral Augmentin.  Has been seen by orthopedics, no additional intervention is needed. Left knee swelling is better.   Acute respiratory failure with hypoxia (HCC) Pulse ox of 85% on room air.   VQ scan negative. Condition improved, no additional oxygen requirement.   Chronic kidney disease  stage IIIa. Acute kidney injury ruled out. Mild hyponatremia Mild metabolic acidosis. Hypomagnesemia. Has chronic kidney disease stage IIIa, not meet criteria for acute kidney injury. Renal function is actually better than baseline.  Magnesium normalized.   UTI (urinary tract infection) Urine culture has mixed flora, but patient may have had a UTI causing her weakness and fall.  Continued oral antibiotics with Augmentin for additional 3 days.   Type 2 diabetes mellitus with hyperlipidemia (HCC) Last hemoglobin A1c 6.3 patient on sliding scale insulin.  Continue Lipitor.   Generalized weakness Mechanical fall. Patient has no syncope episode.  Continue PT/OT,.  Pending nursing home placement.   Hypertension Continue beta-blocker.   History of non-ST elevation myocardial infarction (NSTEMI) Will continue patient on reduced dose metoprolol, aspirin 81, atorvastatin, Plavix 75.   Rheumatoid arthritis (HCC) Patient with scleroderma with crest syndrome also.  On methotrexate   Barrett's esophagus Continue Protonix   Obesity (BMI 30-39.9) BMI 32.60 with current height and weight in compute  Patient condition stable, currently made pending nursing placement.  No new changes in treatment.     Subjective:  Patient feels well today, slept well.  Good appetite.  Had a normal bowel movement.  Physical Exam: Vitals:   04/10/23 1959 04/11/23 0300 04/11/23 0444 04/11/23 0752  BP: (!) 83/61 (!) 144/82  (!) 140/76  Pulse: 88 85  78  Resp: 18 16  18   Temp: 98.9 F (37.2 C) 98.4 F (36.9 C)  98.4 F (36.9 C)  TempSrc: Oral Oral    SpO2: 92% 91%  91%  Weight:   89 kg   Height:  General exam: Appears calm and comfortable, obese Respiratory system: Clear to auscultation. Respiratory effort normal. Cardiovascular system: S1 & S2 heard, RRR. No JVD, murmurs, rubs, gallops or clicks. No pedal edema. Gastrointestinal system: Abdomen is nondistended, soft and nontender. No  organomegaly or masses felt. Normal bowel sounds heard. Central nervous system: Alert and oriented. No focal neurological deficits. Extremities: Symmetric 5 x 5 power. Skin: No rashes, lesions or ulcers Psychiatry: Judgement and insight appear normal. Mood & affect appropriate.    Data Reviewed:  There are no new results to review at this time.  Family Communication: None  Disposition: Status is: Inpatient Remains inpatient appropriate because: Unsafe discharge.     Time spent: 25 minutes  Author: Marrion Coy, MD 04/11/2023 11:39 AM  For on call review [email or web address].

## 2023-04-12 DIAGNOSIS — L03116 Cellulitis of left lower limb: Secondary | ICD-10-CM | POA: Diagnosis not present

## 2023-04-12 DIAGNOSIS — J9601 Acute respiratory failure with hypoxia: Secondary | ICD-10-CM | POA: Diagnosis not present

## 2023-04-12 DIAGNOSIS — N3000 Acute cystitis without hematuria: Secondary | ICD-10-CM | POA: Diagnosis not present

## 2023-04-12 MED ORDER — LACTULOSE 10 GM/15ML PO SOLN
20.0000 g | Freq: Once | ORAL | Status: AC
  Administered 2023-04-12: 20 g via ORAL
  Filled 2023-04-12: qty 30

## 2023-04-12 NOTE — Plan of Care (Signed)

## 2023-04-12 NOTE — Progress Notes (Signed)
Progress Note   Patient: NIMRAH BALAY MWN:[id number] DOB: 1940/09/17 DOA: 04/06/2023     5 DOS: the patient was seen and examined on 04/12/2023   Brief hospital course: 82 year old female past medical history hypertension, pulmonary fibrosis, pulmonary hypertension, rheumatoid arthritis, scleroderma with crest syndrome, type 2 diabetes mellitus, hyperlipidemia, CKD.  She presents with weakness and falls.  Slight burning on urination.  Started on Rocephin for erythema bilateral knees left worse than right. He had a bilateral MRI of the knees, showed a large subcutaneous hematoma in the left knee.  Seen by orthopedic surgery, does not seem to involve the knee joints, may have some secondary cellulitis.  Recommended oral antibiotics. Patient is also evaluated by PT/OT, recommending nursing home placement. Patient also has evidence of urinary tract infection, started on Rocephin.      Principal Problem:   Cellulitis of knee, left Active Problems:   Acute respiratory failure with hypoxia (HCC)   Acute kidney injury superimposed on CKD (HCC)   UTI (urinary tract infection)   Type 2 diabetes mellitus with hyperlipidemia (HCC)   Hypertension   Generalized weakness   History of non-ST elevation myocardial infarction (NSTEMI)   Rheumatoid arthritis (HCC)   Barrett's esophagus   Obesity (BMI 30-39.9)   Hypomagnesemia   Cellulitis of left knee   Hyponatremia   Assessment and Plan:  Cellulitis of knee, left Left knee hematoma. Patient has significant hematoma in the left knee area, no joint involvement.  May have some secondary cellulitis, but condition seem to be improving.  Change antibiotic to oral Augmentin.  Has been seen by orthopedics, no additional intervention is needed. Left knee swelling is better.  1 more day of Augmentin.   Acute respiratory failure with hypoxia (HCC) Pulse ox of 85% on room air.   VQ scan negative. Condition improved, no additional oxygen requirement.    Chronic kidney disease stage IIIa. Acute kidney injury ruled out. Mild hyponatremia Mild metabolic acidosis. Hypomagnesemia. Has chronic kidney disease stage IIIa, not meet criteria for acute kidney injury. Renal function is actually better than baseline.  Magnesium normalized.   UTI (urinary tract infection) Urine culture has mixed flora, but patient may have had a UTI causing her weakness and fall.  1 more day of Augmentin,  Type 2 diabetes mellitus with hyperlipidemia (HCC) Last hemoglobin A1c 6.3 patient on sliding scale insulin.  Continue Lipitor.   Generalized weakness Mechanical fall. Patient has no syncope episode.  Continue PT/OT,.  Pending nursing home placement.   Hypertension Continue beta-blocker.   History of non-ST elevation myocardial infarction (NSTEMI) Will continue patient on reduced dose metoprolol, aspirin 81, atorvastatin, Plavix 75.   Rheumatoid arthritis (HCC) Patient with scleroderma with crest syndrome also.  On methotrexate   Barrett's esophagus Continue Protonix   Obesity (BMI 30-39.9) Diet and exercise advised.      Subjective:  Patient doing well, no complaint.  Physical Exam: Vitals:   04/11/23 1942 04/12/23 0322 04/12/23 0500 04/12/23 0804  BP: 137/66 137/79  (!) 160/69  Pulse: 94 84  80  Resp: 18 18  18   Temp: 98.6 F (37 C) 98.3 F (36.8 C)  98.4 F (36.9 C)  TempSrc:      SpO2: 93% 93%  91%  Weight:   89.7 kg   Height:       General exam: Appears calm and comfortable  Respiratory system: Clear to auscultation. Respiratory effort normal. Cardiovascular system: S1 & S2 heard, RRR. No JVD, murmurs, rubs, gallops or clicks.  No pedal edema. Gastrointestinal system: Abdomen is nondistended, soft and nontender. No organomegaly or masses felt. Normal bowel sounds heard. Central nervous system: Alert and oriented. No focal neurological deficits. Extremities: Left knee much improved.  No redness.  Slight swelling. Skin: No  rashes, lesions or ulcers Psychiatry: Judgement and insight appear normal. Mood & affect appropriate.    Data Reviewed:  Lab results reviewed.  Family Communication: None  Disposition: Status is: Inpatient Remains inpatient appropriate because: Unsafe discharge, pending nursing placement.     Time spent: 35 minutes  Author: Marrion Coy, MD 04/12/2023 12:37 PM  For on call review [email or web address].

## 2023-04-13 DIAGNOSIS — L03116 Cellulitis of left lower limb: Secondary | ICD-10-CM | POA: Diagnosis not present

## 2023-04-13 DIAGNOSIS — J9601 Acute respiratory failure with hypoxia: Secondary | ICD-10-CM | POA: Diagnosis not present

## 2023-04-13 DIAGNOSIS — N189 Chronic kidney disease, unspecified: Secondary | ICD-10-CM | POA: Diagnosis not present

## 2023-04-13 DIAGNOSIS — N179 Acute kidney failure, unspecified: Secondary | ICD-10-CM | POA: Diagnosis not present

## 2023-04-13 MED ORDER — ORAL CARE MOUTH RINSE: 15.0000 mL | OROMUCOSAL | Status: DC | PRN

## 2023-04-13 MED ORDER — TRAMADOL HCL 50 MG PO TABS: 50.0000 mg | ORAL_TABLET | Freq: Three times a day (TID) | ORAL | 0 refills | Status: DC | PRN

## 2023-04-13 NOTE — Care Management Important Message (Signed)
Important Message  Patient Details  Name: Nina Perez MRN: [id number] Date of Birth: 1941/03/09   Important Message Given:  Yes - Medicare IM     Verita Schneiders Aaric Dolph 04/13/2023, 10:38 AM

## 2023-04-13 NOTE — Consult Note (Signed)
[facility] Liaison Note  04/13/2023  [name] [birth year] [id number]  Location: RN Hospital Liaison screened the patient remotely at [facility].  Insurance: Medicare   Nina Perez is a 82 y.o. female who is a Primary Care Patient of Dale Reid, MD [facility] at [facility]. The patient was screened for readmission hospitalization with noted medium risk score for unplanned readmission risk with 2 IP in 6 months.  The patient was assessed for potential Care Management service needs for post hospital transition for care coordination. Review of patient's electronic medical record reveals patient was admitted for Cellulitis left knee. Pt discharged to a SNF level of care. Facility will continue to address pt's ongoing needs. Will collaborate with the PAC-RN on pt's disposition.  Plan: [facility] Liaison will continue to follow progress and disposition to asess for post hospital community care coordination/management needs.  Referral request for community care coordination: Will collaborate with the PAC-RN concerning pt's discharge disposition.   [facility] Care Management/Population Health does not replace or interfere with any arrangements made by the Inpatient Transition of Care team.   For questions contact:   Elliot Cousin, RN, [facility] Liaison [place]   [facility], Population Health Office Hours [email or web address]  8:00 am-6:00 pm Direct Dial: [phone] mobile [phone] [Office toll free line] Office Hours are M-F 8:30 - 5 pm [email or web address]

## 2023-04-13 NOTE — Discharge Summary (Signed)
Physician Discharge Summary   Patient: Nina Perez MRN: [id number] DOB: 1941-03-03  Admit date:     04/06/2023  Discharge date: 04/13/23  Discharge Physician: Marrion Coy   PCP: Dale Goessel, MD   Recommendations at discharge:   Follow-up with PCP in 1 week.  Discharge Diagnoses: Principal Problem:   Cellulitis of knee, left Active Problems:   Acute respiratory failure with hypoxia (HCC)   Acute kidney injury superimposed on CKD (HCC)   UTI (urinary tract infection)   Type 2 diabetes mellitus with hyperlipidemia (HCC)   Hypertension   Generalized weakness   History of non-ST elevation myocardial infarction (NSTEMI)   Rheumatoid arthritis (HCC)   Barrett's esophagus   Obesity (BMI 30-39.9)   Hypomagnesemia   Cellulitis of left knee   Hyponatremia  Resolved Problems:   * No resolved hospital problems. *  Hospital Course: 82 year old female past medical history hypertension, pulmonary fibrosis, pulmonary hypertension, rheumatoid arthritis, scleroderma with crest syndrome, type 2 diabetes mellitus, hyperlipidemia, CKD.  She presents with weakness and falls.  Slight burning on urination.  Started on Rocephin for erythema bilateral knees left worse than right. He had a bilateral MRI of the knees, showed a large subcutaneous hematoma in the left knee.  Seen by orthopedic surgery, does not seem to involve the knee joints, may have some secondary cellulitis.  Recommended oral antibiotics. Patient is also evaluated by PT/OT, recommending nursing home placement. Patient also has evidence of urinary tract infection, started on Rocephin.  Condition has improved, leg edema still present, no redness anymore.  Medically stable for discharge.   Assessment and Plan: Cellulitis of knee, left Left knee hematoma. Patient has significant hematoma in the left knee area, no joint involvement.  May have some secondary cellulitis, but condition seem to be improving.  Change antibiotic to oral  Augmentin.  Has been seen by orthopedics, no additional intervention is needed. Left knee swelling is better.  Antibiotics completed.   Acute respiratory failure with hypoxia (HCC) Pulse ox of 85% on room air.   VQ scan negative. Condition improved, no additional oxygen requirement.   Chronic kidney disease stage IIIa. Acute kidney injury ruled out. Mild hyponatremia Mild metabolic acidosis. Hypomagnesemia. Has chronic kidney disease stage IIIa, not meet criteria for acute kidney injury. Renal function is actually better than baseline.  Magnesium normalized.   UTI (urinary tract infection) Urine culture has mixed flora, but patient may have had a UTI causing her weakness and fall.  Antibiotic completed.   Type 2 diabetes mellitus with hyperlipidemia (HCC) Last hemoglobin A1c 6.3 patient on sliding scale insulin.  Continue Lipitor.   Generalized weakness Mechanical fall. Patient has no syncope episode.  Patient going to SNF for rehab.  Hypertension Continue beta-blocker.   History of non-ST elevation myocardial infarction (NSTEMI) Will continue patient on reduced dose metoprolol, aspirin 81, atorvastatin, Plavix 75.   Rheumatoid arthritis (HCC) Patient with scleroderma with crest syndrome also.  On methotrexate   Barrett's esophagus Continue Protonix   Obesity (BMI 30-39.9) Diet and exercise advised.             Consultants: Orthopedics Procedures performed: None  Disposition: Skilled nursing facility Diet recommendation:  Discharge Diet Orders (From admission, onward)     Start     Ordered   04/13/23 0000  Diet - low sodium heart healthy        04/13/23 1017           Cardiac diet DISCHARGE MEDICATION: Allergies as of 04/13/2023  Reactions   Ethanol Other (See Comments)   REDNESS AND BLISTERS   Tape Other (See Comments)   Ob band aid adhesive  Pull skin off / Rash Ob band aid adhesive  Pull skin off / Rash   Triamterene-hctz Other (See  Comments)   Weakness/fatigue   Cephalexin Rash   Blotchiness to B lower legs after several days of cephalexin for UTI   Hydrochlorothiazide W-triamterene Other (See Comments)   Other reaction(s): Other (See Comments) Extreme fatigue/weakness Extreme fatigue/weakness    Other reaction(s): Other (See Comments) Extreme fatigue/weakness        Medication List     STOP taking these medications    amoxicillin-clavulanate 500-125 MG tablet Commonly known as: Augmentin   doxycycline 100 MG capsule Commonly known as: VIBRAMYCIN   losartan 100 MG tablet Commonly known as: COZAAR   Magnesium 200 MG Tabs       TAKE these medications    acetaminophen 650 MG CR tablet Commonly known as: TYLENOL Take 650-1,300 mg by mouth every 8 (eight) hours as needed for pain.   allopurinol 100 MG tablet Commonly known as: ZYLOPRIM Take 1 tablet (100 mg total) by mouth daily.   amLODipine 5 MG tablet Commonly known as: NORVASC Take 1 tablet (5 mg total) by mouth daily.   aspirin EC 81 MG tablet Take 1 tablet (81 mg total) by mouth daily. Swallow whole.   atorvastatin 80 MG tablet Commonly known as: LIPITOR Take 1 tablet (80 mg total) by mouth daily. What changed: how much to take   clopidogrel 75 MG tablet Commonly known as: PLAVIX Take 1 tablet (75 mg total) by mouth daily with breakfast.   fexofenadine 180 MG tablet Commonly known as: ALLEGRA Take 1 tablet (180 mg total) by mouth daily. What changed:  when to take this reasons to take this   folic acid 1 MG tablet Commonly known as: FOLVITE Take 1 mg by mouth every evening.   glucose blood test strip Commonly known as: IT consultant USE  STRIP TO CHECK GLUCOSE TWICE DAILY. Dx: E11.9   ipratropium 0.06 % nasal spray Commonly known as: ATROVENT Place 2 sprays into both nostrils 4 (four) times daily. What changed:  when to take this reasons to take this   magnesium oxide 400 MG tablet Commonly known as:  MAG-OX Take 1 tablet (400 mg total) by mouth 2 (two) times daily.   metFORMIN 500 MG tablet Commonly known as: GLUCOPHAGE Take 1 tablet by mouth once daily   methotrexate 2.5 MG tablet Commonly known as: RHEUMATREX Take 10 mg by mouth every Wednesday.   metoprolol succinate 100 MG 24 hr tablet Commonly known as: TOPROL-XL Take 1 tablet (100 mg total) by mouth daily. Take with or immediately following a meal.   Microlet Lancets Misc Check sugar once daily, [employer] Microlet Lancets. Dx E11.9   nitroGLYCERIN 0.4 MG SL tablet Commonly known as: NITROSTAT Place 1 tablet (0.4 mg total) under the tongue every 5 (five) minutes x 3 doses as needed for chest pain.   pantoprazole 40 MG tablet Commonly known as: PROTONIX Take 1 tablet by mouth once daily   spironolactone 25 MG tablet Commonly known as: ALDACTONE Take 1 tablet by mouth once daily   torsemide 20 MG tablet Commonly known as: DEMADEX Take 1 tablet (20 mg total) by mouth daily. Taking 1/2 tablet just as needed   traMADol 50 MG tablet Commonly known as: ULTRAM Take 1 tablet (50 mg total) by mouth every 8 (  eight) hours as needed.               Discharge Care Instructions  (From admission, onward)           Start     Ordered   04/13/23 0000  Discharge wound care:       Comments: Follow with RN   04/13/23 1017            Follow-up Information     Dale [place], MD Follow up in 1 week(s).   Specialty: Internal Medicine Contact information: [address] [place] [address] [phone]                Discharge Exam: Ceasar Mons Weights   04/11/23 0444 04/12/23 0500 04/13/23 0233  Weight: 89 kg 89.7 kg 90.6 kg   General exam: Appears calm and comfortable  Respiratory system: Clear to auscultation. Respiratory effort normal. Cardiovascular system: S1 & S2 heard, RRR. No JVD, murmurs, rubs, gallops or clicks. No pedal edema. Gastrointestinal system: Abdomen is  nondistended, soft and nontender. No organomegaly or masses felt. Normal bowel sounds heard. Central nervous system: Alert and oriented. No focal neurological deficits. Extremities: Left knee swelling, no redness. Skin: No rashes, lesions or ulcers Psychiatry: Judgement and insight appear normal. Mood & affect appropriate.    Condition at discharge: good  The results of significant diagnostics from this hospitalization (including imaging, microbiology, ancillary and laboratory) are listed below for reference.   Imaging Studies: ECHOCARDIOGRAM COMPLETE BUBBLE STUDY  Result Date: 04/07/2023    ECHOCARDIOGRAM REPORT   Patient Name:   Nina Perez Date of Exam: 04/07/2023 Medical Rec #:  [id number]    Height:       66.0 in Accession #:    [id number]   Weight:       202.0 lb Date of Birth:  Nov 16, 1940    BSA:          2.008 m Patient Age:    82 years     BP:           105/52 mmHg Patient Gender: F            HR:           61 bpm. Exam Location:  [facility] Procedure: 2D Echo, Cardiac Doppler, Color Doppler and Saline Contrast Bubble            Study Indications:     Syncope  History:         Patient has prior history of Echocardiogram examinations, most                  recent 11/23/2022. Cardiomyopathy and CHF, CAD and Previous                  Myocardial Infarction, Abnormal ECG, Pulmonary HTN,                  Signs/Symptoms:Syncope, Dizziness/Lightheadedness, Murmur and                  Shortness of Breath; Risk Factors:Hypertension, Diabetes and                  Dyslipidemia. CKD.  Sonographer:     Mikki Harbor Referring Phys:  [id number] Eliezer Mccoy PATEL Diagnosing Phys: Yvonne Kendall MD  Sonographer Comments: Image acquisition challenging due to patient body habitus. IMPRESSIONS  1. Left ventricular ejection fraction, by estimation, is 50 to 55%. The left ventricle has low normal function. The  left ventricle has no regional wall motion abnormalities. There is mild left ventricular hypertrophy. Left  ventricular diastolic parameters are consistent with Grade I diastolic dysfunction (impaired relaxation). Elevated left atrial pressure.  2. Right ventricular systolic function is mildly reduced. The right ventricular size is mildly enlarged. Severely increased right ventricular wall thickness. Tricuspid regurgitation signal is inadequate for assessing PA pressure.  3. Left atrial size was mild to moderately dilated.  4. The mitral valve is degenerative. Mild to moderate mitral valve regurgitation. No evidence of mitral stenosis.  5. The aortic valve is tricuspid. There is mild thickening of the aortic valve. Aortic valve regurgitation is mild. Aortic valve sclerosis is present, with no evidence of aortic valve stenosis.  6. The inferior vena cava is normal in size with <50% respiratory variability, suggesting right atrial pressure of 8 mmHg.  7. Agitated saline contrast bubble study was negative, with no evidence of any interatrial shunt. FINDINGS  Left Ventricle: Left ventricular ejection fraction, by estimation, is 50 to 55%. The left ventricle has low normal function. The left ventricle has no regional wall motion abnormalities. The left ventricular internal cavity size was normal in size. There is mild left ventricular hypertrophy. Left ventricular diastolic parameters are consistent with Grade I diastolic dysfunction (impaired relaxation). Elevated left atrial pressure. Right Ventricle: The right ventricular size is mildly enlarged. Severely increased right ventricular wall thickness. Right ventricular systolic function is mildly reduced. Tricuspid regurgitation signal is inadequate for assessing PA pressure. Left Atrium: Left atrial size was mild to moderately dilated. Right Atrium: Right atrial size was normal in size. Pericardium: There is no evidence of pericardial effusion. Mitral Valve: The mitral valve is degenerative in appearance. There is mild thickening of the mitral valve leaflet(s). There is mild  calcification of the mitral valve leaflet(s). Mild to moderate mitral annular calcification. Mild to moderate mitral valve regurgitation. No evidence of mitral valve stenosis. MV peak gradient, 11.6 mmHg. The mean mitral valve gradient is 4.0 mmHg. Tricuspid Valve: The tricuspid valve is not well visualized. Tricuspid valve regurgitation is not demonstrated. Aortic Valve: The aortic valve is tricuspid. There is mild thickening of the aortic valve. Aortic valve regurgitation is mild. Aortic valve sclerosis is present, with no evidence of aortic valve stenosis. Aortic valve mean gradient measures 8.0 mmHg. Aortic valve peak gradient measures 15.7 mmHg. Aortic valve area, by VTI measures 2.03 cm. Pulmonic Valve: The pulmonic valve was not well visualized. Pulmonic valve regurgitation is trivial. No evidence of pulmonic stenosis. Aorta: The aortic root and ascending aorta are structurally normal, with no evidence of dilitation. Pulmonary Artery: The pulmonary artery is not well seen. Venous: The inferior vena cava is normal in size with less than 50% respiratory variability, suggesting right atrial pressure of 8 mmHg. IAS/Shunts: The interatrial septum was not well visualized. Agitated saline contrast was given intravenously to evaluate for intracardiac shunting. Agitated saline contrast bubble study was negative, with no evidence of any interatrial shunt.  LEFT VENTRICLE PLAX 2D LVIDd:         4.00 cm   Diastology LVIDs:         2.90 cm   LV e' medial:    5.22 cm/s LV PW:         1.15 cm   LV E/e' medial:  18.6 LV IVS:        1.18 cm   LV e' lateral:   7.07 cm/s LVOT diam:     2.00 cm   LV E/e' lateral: 13.7  LV SV:         97 LV SV Index:   48 LVOT Area:     3.14 cm  RIGHT VENTRICLE RV Basal diam:  3.65 cm RV Mid diam:    3.60 cm RV S prime:     12.00 cm/s LEFT ATRIUM              Index        RIGHT ATRIUM           Index LA diam:        4.20 cm  2.09 cm/m   RA Area:     16.20 cm LA Vol (A2C):   111.0 ml 55.27  ml/m  RA Volume:   37.40 ml  18.62 ml/m LA Vol (A4C):   69.0 ml  34.36 ml/m LA Biplane Vol: 87.9 ml  43.77 ml/m  AORTIC VALVE                     PULMONIC VALVE AV Area (Vmax):    2.09 cm      PV Vmax:       0.91 m/s AV Area (Vmean):   2.07 cm      PV Peak grad:  3.3 mmHg AV Area (VTI):     2.03 cm AV Vmax:           198.00 cm/s AV Vmean:          127.000 cm/s AV VTI:            0.479 m AV Peak Grad:      15.7 mmHg AV Mean Grad:      8.0 mmHg LVOT Vmax:         132.00 cm/s LVOT Vmean:        83.600 cm/s LVOT VTI:          0.309 m LVOT/AV VTI ratio: 0.65  AORTA Ao Root diam: 3.50 cm Ao Asc diam:  2.90 cm MITRAL VALVE MV Area (PHT): 2.86 cm     SHUNTS MV Area VTI:   2.42 cm     Systemic VTI:  0.31 m MV Peak grad:  11.6 mmHg    Systemic Diam: 2.00 cm MV Mean grad:  4.0 mmHg MV Vmax:       1.70 m/s MV Vmean:      96.8 cm/s MV Decel Time: 265 msec MV E velocity: 96.90 cm/s MV A velocity: 144.00 cm/s MV E/A ratio:  0.67 Cristal Deer End MD Electronically signed by Yvonne Kendall MD Signature Date/Time: 04/07/2023/6:36:25 PM    Final    NM Pulmonary Perfusion  Result Date: 04/07/2023 CLINICAL DATA:  Pulmonary embolism (PE) suspected, high prob EXAM: NUCLEAR MEDICINE PERFUSION LUNG SCAN TECHNIQUE: Perfusion images were obtained in multiple projections after intravenous injection of radiopharmaceutical. Ventilation scans intentionally deferred if perfusion scan and chest x-ray adequate for interpretation during COVID 19 epidemic. RADIOPHARMACEUTICALS:  4.38 mCi Tc-28m MAA IV COMPARISON:  X-ray 04/07/2023 FINDINGS: Heterogeneous distribution of radiotracer within both lungs. Scattered small peripheral subsegmental perfusion defects. No segmental or larger mismatched perfusion defect. IMPRESSION: Low probability for pulmonary embolism. Electronically Signed   By: Duanne Guess D.O.   On: 04/07/2023 10:38   DG Chest 2 View  Result Date: 04/07/2023 CLINICAL DATA:  Shortness of breath. EXAM: CHEST - 2 VIEW  COMPARISON:  11/21/2022. FINDINGS: Minimal atelectasis/scarring noted overlying the left lateral costophrenic angle. Bilateral lung fields are otherwise clear. No acute consolidation or lung collapse. Bilateral costophrenic angles are  clear. Stable cardio-mediastinal silhouette. No acute osseous abnormalities. The soft tissues are within normal limits. IMPRESSION: *No active cardiopulmonary disease. Electronically Signed   By: Jules Schick M.D.   On: 04/07/2023 10:19   MR KNEE RIGHT WO CONTRAST  Result Date: 04/07/2023 CLINICAL DATA:  Possible soft tissue infection, fall. EXAM: MRI OF THE RIGHT KNEE WITHOUT CONTRAST TECHNIQUE: Multiplanar, multisequence MR imaging of the knee was performed. No intravenous contrast was administered. 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 MENISCI Medial meniscus:  N/A Lateral meniscus:  N/A LIGAMENTS Cruciates:  N/A Collaterals:  Intact where visible. CARTILAGE Patellofemoral:  N/A Medial:  N/A Lateral:  N/A Joint:  Small effusion in the suprapatellar bursa. Popliteal Fossa: Very small Baker's cyst. Small amount of infiltrative edema along the popliteal space. Extensor Mechanism:  Unremarkable Bones: Subtle marrow edema the proximal fibula without definite fracture, potentially from mild stress reaction or arthropathy. Other: Focal infiltration of the subcutaneous edema anterolateral to the proximal tibia for example on image 25 series 11, likely representing bruising. No large hematoma in this vicinity. There is mild posterior and and anterolateral subcutaneous edema tracking along the knee in into the calf. No drainable abscess. IMPRESSION: 1. Focal edema of the subcutaneous edema anterolateral to the proximal tibia, likely representing bruising. No large hematoma in this vicinity. 2. Mild posterior and anterolateral subcutaneous edema tracking along the knee in into the calf. No drainable  abscess. 3. Subtle marrow edema in the proximal fibula without definite fracture, potentially from mild stress reaction or arthropathy. 4. Small effusion in the suprapatellar bursa. Very small Baker's cyst. 5. 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 Electronically Signed   By: Gaylyn Rong M.D.   On: 04/07/2023 08:10   MR KNEE LEFT WO CONTRAST  Result Date: 04/07/2023 CLINICAL DATA:  Fall. Knee pain and trauma. Reduced mobility. Swelling in both knees. EXAM: MRI OF THE LEFT KNEE WITHOUT CONTRAST TECHNIQUE: Multiplanar, multisequence MR imaging of the knee was performed. No intravenous contrast was administered. COMPARISON:  Report from radiographs dated 04/02/2023 FINDINGS: MENISCI Medial meniscus: Prominent and mostly degenerative tearing of the medial meniscus with grade 3 signal extending obliquely to the inferior surface in the anterior and posterior horns spanning nearly the entire length of the meniscus, and also abnormal grade 3 extension to the superior surface in the anterior horn on image 55 series 18. Lateral meniscus: Degenerative tearing of the posterior horn and midbody with hazy accentuated signal throughout the diminutive visualized meniscus, and peripheral extrusion. Likely degenerative tearing in the anterior horn especially involving the central/free edge component of the anterior horn. LIGAMENTS Cruciates:  Unremarkable Collaterals: Mild proximal popliteus tendinopathy. Otherwise unremarkable. CARTILAGE Patellofemoral: Generally moderate degenerative chondral thinning although with prominent chondral thinning superiorly along the posterior patellar ridge and adjacent lateral facet, with underlying small subcortical foci of edema. Medial: Moderate to prominent degenerative chondral thinning with marginal spurring. Lateral: Severe degenerative chondral thinning with marginal spurring. Joint:  Small knee effusion. Popliteal Fossa: Infiltrative edema in the popliteal  space. Small Baker's cyst. Extensor Mechanism:  Unremarkable Bones: No significant extra-articular osseous abnormalities identified. Other: 10.1 by 1.9 by 11.4 cm (volume = 110 cm^3) anterolateral subcutaneous hematoma along the knee and proximal shin. IMPRESSION: 1. Large anterolateral subcutaneous hematoma along the knee and proximal shin, with a volume of 110 cc. 2. Prominent and mostly degenerative tearing of the medial meniscus. 3. Degenerative tearing of the lateral meniscus, with  peripheral extrusion. 4. 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 5. Small knee effusion. Small Baker's cyst. 6. Mild proximal popliteus tendinopathy. Electronically Signed   By: Gaylyn Rong M.D.   On: 04/07/2023 08:05   US Venous Img Lower Bilateral (DVT)  Result Date: 04/07/2023 CLINICAL DATA:  Pain and swelling left lower leg. EXAM: Bilateral LOWER EXTREMITY VENOUS DOPPLER ULTRASOUND TECHNIQUE: Gray-scale sonography with compression, as well as color and duplex ultrasound, were performed to evaluate the deep venous system(s) from the level of the common femoral vein through the popliteal and proximal calf veins. COMPARISON:  Ultrasound 06/16/2018 FINDINGS: VENOUS Normal compressibility of the common femoral, superficial femoral, and popliteal veins, as well as the visualized calf veins. Visualized portions of profunda femoral vein and great saphenous vein unremarkable. No filling defects to suggest DVT on grayscale or color Doppler imaging. Doppler waveforms show normal direction of venous flow, normal respiratory plasticity and response to augmentation. Limited views of the contralateral common femoral vein are unremarkable. OTHER Subcutaneous edema in both calves. Limitations: none IMPRESSION: Negative for acute DVT. Electronically Signed   By: Minerva Fester M.D.   On: 04/07/2023 01:28   CT Head Wo Contrast  Result Date:  04/06/2023 CLINICAL DATA:  Head trauma EXAM: CT HEAD WITHOUT CONTRAST TECHNIQUE: Contiguous axial images were obtained from the base of the skull through the vertex without intravenous contrast. RADIATION DOSE REDUCTION: This exam was performed according to the departmental dose-optimization program which includes automated exposure control, adjustment of the mA and/or kV according to patient size and/or use of iterative reconstruction technique. COMPARISON:  None Available. FINDINGS: Brain: No evidence of acute infarction, hemorrhage, hydrocephalus, extra-axial collection or mass lesion/mass effect. There is moderate diffuse atrophy. There is mild periventricular white matter hypodensity, likely chronic small vessel ischemic change. Vascular: Atherosclerotic calcifications are present within the cavernous internal carotid arteries. Skull: Normal. Negative for fracture or focal lesion. Sinuses/Orbits: No acute finding. Other: None. IMPRESSION: 1. No acute intracranial process. 2. Moderate diffuse atrophy and mild chronic small vessel ischemic change. Electronically Signed   By: Darliss Cheney M.D.   On: 04/06/2023 19:16    Microbiology: Results for orders placed or performed during the hospital encounter of 04/06/23  Resp panel by RT-PCR (RSV, Flu A&B, Covid) Anterior Nasal Swab     Status: None   Collection Time: 04/06/23  6:26 PM   Specimen: Anterior Nasal Swab  Result Value Ref Range Status   SARS Coronavirus 2 by RT PCR NEGATIVE NEGATIVE Final    Comment: (NOTE) SARS-CoV-2 target nucleic acids are NOT DETECTED.  The SARS-CoV-2 RNA is generally detectable in upper respiratory specimens during the acute phase of infection. The lowest concentration of SARS-CoV-2 viral copies this assay can detect is 138 copies/mL. A negative result does not preclude SARS-Cov-2 infection and should not be used as the sole basis for treatment or other patient management decisions. A negative result may occur with   improper specimen collection/handling, submission of specimen other than nasopharyngeal swab, presence of viral mutation(s) within the areas targeted by this assay, and inadequate number of viral copies(<138 copies/mL). A negative result must be combined with clinical observations, patient history, and epidemiological information. The expected result is Negative.  Fact Sheet for Patients:  BloggerCourse.com  Fact Sheet for Healthcare Providers:  SeriousBroker.it  This test is no t yet approved or cleared by the [place] FDA and  has been authorized for detection and/or diagnosis of SARS-CoV-2 by  FDA under an Emergency Use Authorization (EUA). This EUA will remain  in effect (meaning this test can be used) for the duration of the COVID-19 declaration under Section 564(b)(1) of the Act, 21 U.S.C.section 360bbb-3(b)(1), unless the authorization is terminated  or revoked sooner.       Influenza A by PCR NEGATIVE NEGATIVE Final   Influenza B by PCR NEGATIVE NEGATIVE Final    Comment: (NOTE) The Xpert Xpress SARS-CoV-2/FLU/RSV plus assay is intended as an aid in the diagnosis of influenza from Nasopharyngeal swab specimens and should not be used as a sole basis for treatment. Nasal washings and aspirates are unacceptable for Xpert Xpress SARS-CoV-2/FLU/RSV testing.  Fact Sheet for Patients: BloggerCourse.com  Fact Sheet for Healthcare Providers: SeriousBroker.it  This test is not yet approved or cleared by the [place] FDA and has been authorized for detection and/or diagnosis of SARS-CoV-2 by FDA under an Emergency Use Authorization (EUA). This EUA will remain in effect (meaning this test can be used) for the duration of the COVID-19 declaration under Section 564(b)(1) of the Act, 21 U.S.C. section 360bbb-3(b)(1), unless the authorization is terminated or revoked.      Resp Syncytial Virus by PCR NEGATIVE NEGATIVE Final    Comment: (NOTE) Fact Sheet for Patients: BloggerCourse.com  Fact Sheet for Healthcare Providers: SeriousBroker.it  This test is not yet approved or cleared by the [place] FDA and has been authorized for detection and/or diagnosis of SARS-CoV-2 by FDA under an Emergency Use Authorization (EUA). This EUA will remain in effect (meaning this test can be used) for the duration of the COVID-19 declaration under Section 564(b)(1) of the Act, 21 U.S.C. section 360bbb-3(b)(1), unless the authorization is terminated or revoked.  Performed at [facility], [address] [place], [place] [address]   Urine Culture     Status: Abnormal   Collection Time: 04/07/23 10:23 AM   Specimen: Urine, Random  Result Value Ref Range Status   Specimen Description   Final    URINE, RANDOM Performed at [facility], [address] [place], [place] [address]    Special Requests   Final    NONE Reflexed from [id number] Performed at [facility], [address] [place], [place] [address]    Culture MULTIPLE SPECIES PRESENT, SUGGEST RECOLLECTION (A)  Final   Report Status 04/08/2023 FINAL  Final    Labs: CBC: Recent Labs  Lab 04/06/23 1514 04/07/23 0605 04/08/23 0441  WBC 6.4 4.2 5.8  HGB 11.9* 10.4* 10.6*  HCT 38.4 32.5* 32.0*  MCV 105.8* 101.6* 99.4  PLT 330 204 192   Basic Metabolic Panel: Recent Labs  Lab 04/06/23 1514 04/07/23 0605 04/08/23 0441  NA 133* 136 134*  K 4.4 4.4 4.5  CL 103 106 106  CO2 20* 21* 24  GLUCOSE 114* 86 79  BUN 37* 42* 25*  CREATININE 1.85* 1.65* 1.07*  CALCIUM 9.6 9.2 9.4  MG  --  1.4* 1.9  PHOS  --  3.1  --    Liver Function Tests: Recent Labs  Lab 04/06/23 1826 04/07/23 0605  AST 29 19  ALT 18 14  ALKPHOS 83 76  BILITOT 1.1 0.8  PROT 6.6 5.8*  ALBUMIN 2.7* 2.4*   CBG: Recent Labs  Lab 04/06/23 1844   GLUCAP 113*    Discharge time spent: greater than 30 minutes.  Signed: Marrion Coy, MD [facility] 04/13/2023

## 2023-04-13 NOTE — TOC Transition Note (Signed)
Transition of Care [facility]) - CM/SW Discharge Note   Patient Details  Name: Nina Perez MRN: [id number] Date of Birth: [birth year]  [facility] CM/SW Contact:  Allena Katz, LCSW Phone Number: 04/13/2023, 10:31 AM   Clinical Narrative:   Pt has orders to discharge to [facility]. RN given number for report. Spouse notified. Medical necessity on unit. Ricky notified.     Final next level of care: Skilled Nursing Facility Barriers to Discharge: Barriers Resolved   Patient Goals and CMS Choice CMS Medicare.gov Compare Post Acute Care list provided to:: Patient Choice offered to / list presented to : Patient  Discharge Placement                Patient chooses bed at:  [facility]) Patient to be transferred to facility by: [facility] Name of family member notified: spouse Patient and family notified of of transfer: 04/13/23  Discharge Plan and Services Additional resources added to the After Visit Summary for                                       Social Determinants of Health (SDOH) Interventions SDOH Screenings   Food Insecurity: No Food Insecurity (04/07/2023)  Housing: Low Risk  (04/07/2023)  Transportation Needs: No Transportation Needs (04/07/2023)  Utilities: Not At Risk (04/07/2023)  Alcohol Screen: Low Risk  (01/15/2023)  Depression (PHQ2-9): Low Risk  (02/04/2023)  Financial Resource Strain: Low Risk  (01/15/2023)  Physical Activity: Unknown (01/15/2023)  Social Connections: Moderately Integrated (01/15/2023)  Stress: No Stress Concern Present (01/15/2023)  Tobacco Use: Medium Risk (04/06/2023)  Health Literacy: Adequate Health Literacy (01/15/2023)     Readmission Risk Interventions    04/08/2023    2:23 PM  Readmission Risk Prevention Plan  Transportation Screening Complete  PCP or Specialist Appt within 3-5 Days Complete  HRI or Home Care Consult Complete  Medication Review (RN Care Manager) Complete

## 2023-04-15 ENCOUNTER — Telehealth: Payer: Self-pay

## 2023-04-15 NOTE — Telephone Encounter (Signed)
Pneumonia shot series has been complete. Pt aware she does not need.

## 2023-04-15 NOTE — Telephone Encounter (Signed)
Patient states she is at [facility].  Patient states her husband said he told them it was ok for them to give her a pneumonia shot, but patient does not think she is due for this shot.  Patient states she would like to know if she is due this shot before they come around to give it to her.  Patient states she is at [facility].

## 2023-05-26 ENCOUNTER — Telehealth: Payer: Self-pay | Admitting: Internal Medicine

## 2023-05-26 NOTE — Telephone Encounter (Signed)
 I received a call from [facility] stating they will see pt for PT/OT. Orders will fax for Dr Lorin Picket to sign once pt is seen.

## 2023-05-27 NOTE — Telephone Encounter (Signed)
 Noted.

## 2023-06-02 ENCOUNTER — Ambulatory Visit: Payer: Medicare Other | Admitting: Internal Medicine

## 2023-06-02 VITALS — BP 128/70 | HR 67 | Temp 98.0°F | Resp 16 | Ht 63.0 in | Wt 192.0 lb

## 2023-06-02 DIAGNOSIS — D649 Anemia, unspecified: Secondary | ICD-10-CM | POA: Diagnosis not present

## 2023-06-02 DIAGNOSIS — K2100 Gastro-esophageal reflux disease with esophagitis, without bleeding: Secondary | ICD-10-CM

## 2023-06-02 DIAGNOSIS — D1771 Benign lipomatous neoplasm of kidney: Secondary | ICD-10-CM

## 2023-06-02 DIAGNOSIS — D696 Thrombocytopenia, unspecified: Secondary | ICD-10-CM

## 2023-06-02 DIAGNOSIS — Z7984 Long term (current) use of oral hypoglycemic drugs: Secondary | ICD-10-CM | POA: Diagnosis not present

## 2023-06-02 DIAGNOSIS — M7989 Other specified soft tissue disorders: Secondary | ICD-10-CM

## 2023-06-02 DIAGNOSIS — S81002D Unspecified open wound, left knee, subsequent encounter: Secondary | ICD-10-CM

## 2023-06-02 DIAGNOSIS — R531 Weakness: Secondary | ICD-10-CM

## 2023-06-02 DIAGNOSIS — E785 Hyperlipidemia, unspecified: Secondary | ICD-10-CM | POA: Diagnosis not present

## 2023-06-02 DIAGNOSIS — I429 Cardiomyopathy, unspecified: Secondary | ICD-10-CM

## 2023-06-02 DIAGNOSIS — I2510 Atherosclerotic heart disease of native coronary artery without angina pectoris: Secondary | ICD-10-CM

## 2023-06-02 DIAGNOSIS — M069 Rheumatoid arthritis, unspecified: Secondary | ICD-10-CM

## 2023-06-02 DIAGNOSIS — I10 Essential (primary) hypertension: Secondary | ICD-10-CM

## 2023-06-02 DIAGNOSIS — N281 Cyst of kidney, acquired: Secondary | ICD-10-CM

## 2023-06-02 DIAGNOSIS — E1169 Type 2 diabetes mellitus with other specified complication: Secondary | ICD-10-CM

## 2023-06-02 DIAGNOSIS — I70221 Atherosclerosis of native arteries of extremities with rest pain, right leg: Secondary | ICD-10-CM

## 2023-06-02 DIAGNOSIS — I272 Pulmonary hypertension, unspecified: Secondary | ICD-10-CM

## 2023-06-02 DIAGNOSIS — N1831 Chronic kidney disease, stage 3a: Secondary | ICD-10-CM

## 2023-06-02 DIAGNOSIS — Z89422 Acquired absence of other left toe(s): Secondary | ICD-10-CM

## 2023-06-02 DIAGNOSIS — I700 Atherosclerosis of aorta: Secondary | ICD-10-CM

## 2023-06-02 DIAGNOSIS — E7800 Pure hypercholesterolemia, unspecified: Secondary | ICD-10-CM

## 2023-06-02 DIAGNOSIS — M341 CR(E)ST syndrome: Secondary | ICD-10-CM

## 2023-06-02 MED ORDER — MAGNESIUM OXIDE 400 MG PO TABS: 400.0000 mg | ORAL_TABLET | Freq: Two times a day (BID) | ORAL | 1 refills | Status: DC

## 2023-06-02 MED ORDER — AMLODIPINE BESYLATE 5 MG PO TABS: 5.0000 mg | ORAL_TABLET | Freq: Every day | ORAL | 1 refills | Status: DC

## 2023-06-02 MED ORDER — CLOPIDOGREL BISULFATE 75 MG PO TABS: 75.0000 mg | ORAL_TABLET | Freq: Every day | ORAL | 1 refills | Status: DC

## 2023-06-02 MED ORDER — PANTOPRAZOLE SODIUM 40 MG PO TBEC: 40.0000 mg | DELAYED_RELEASE_TABLET | Freq: Every day | ORAL | 3 refills | Status: DC

## 2023-06-02 MED ORDER — ALLOPURINOL 100 MG PO TABS: 100.0000 mg | ORAL_TABLET | Freq: Every day | ORAL | 1 refills | Status: DC

## 2023-06-02 MED ORDER — SPIRONOLACTONE 25 MG PO TABS: 25.0000 mg | ORAL_TABLET | Freq: Every day | ORAL | 0 refills | Status: DC

## 2023-06-02 MED ORDER — METFORMIN HCL 500 MG PO TABS: 500.0000 mg | ORAL_TABLET | Freq: Every day | ORAL | 1 refills | Status: DC

## 2023-06-02 MED ORDER — METOPROLOL SUCCINATE ER 100 MG PO TB24: 100.0000 mg | ORAL_TABLET | Freq: Every day | ORAL | 1 refills | Status: DC

## 2023-06-02 NOTE — Progress Notes (Signed)
Subjective:    Patient ID: Nina Perez, female    DOB: 12-26-1940, 83 y.o.   MRN: [id number]  Patient here for  Chief Complaint  Patient presents with   Medical Management of Chronic Issues    HPI Hospital/rehab follow up. Admitted 04/06/23 - 04/13/23 - after presenting with weakness and falls. She had a bilateral MRI of the knees, showed a large subcutaneous hematoma in the left knee. Evaluated by ortho. Recommended oral abx. Recommended SNF - therapy. Remained in therapy until 05/27/23 - when she returned home from SNF. Received therapy while there. Planning for [facility] now. Persistent left knee scab - wound much improved. No surrounding redness. No increased swelling - lower extremity. Swelling improved. Breathing overall stable. No increased cough or congestion. Eating. Main concern is weakness. Waling - 10-12 feet. Discussed using walker.    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HCC)    a.) 2/2 scleroderma Dx   LBBB (left bundle branch block)    Long term current use of antithrombotics/antiplatelets    a.) ASA + clopidogrel   Long term current use of immunosuppressive drug    a.) MTX for RA and scleroderma Dx   OSA on CPAP    Osteoarthritis    a.) knees, spine   PAD (peripheral artery disease) (HCC)    Phlebitis    a.) x 2 (with pregnancy)   Pulmonary fibrosis (HCC)    a.) mild   Pulmonary hypertension (HCC) 02/24/2012   a.) TTE 02/24/2012: EF 55%, RVSP 33; b.) TTE 10/12/2014: EF >55%, RVSP 36.1; c.) TTE 09/02/2017: EF 55%, RVSP 45.5   Reflux esophagitis    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 b.) renal US 05/23/2020: solid LEFT renal mass measuring 1.4 x 1.5 x 1.6. 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, sclerodactyly, telangiectasias   Skin cancer 2018   Supplemental oxygen dependent    a.) 2L/[place] PRN for DOE related to mild pulmonary fibrosis and pulmonary HTN   T2DM (type 2 diabetes mellitus) (HCC)    Past Surgical History:  Procedure Laterality Date   ABDOMINAL AORTOGRAM W/LOWER EXTREMITY Left 05/16/2019   Procedure: ABDOMINAL AORTOGRAM W/LOWER EXTREMITY;  Surgeon: Runell Gess, MD;  [facility]: [facility];  Service: Cardiovascular;  Laterality: Left;   AMPUTATION TOE Left 06/02/2019   Procedure: AMPUTATION TOE;  Surgeon: Recardo Evangelist, DPM;  Location: [facility];  Service: Podiatry;  Laterality: Left;   APPENDECTOMY  1972   BREAST CYST ASPIRATION Left    neg   COLONOSCOPY WITH PROPOFOL N/A 09/26/2021   Procedure: COLONOSCOPY WITH PROPOFOL;  Surgeon: Jaynie Collins, DO;  Location: [facility];  Service: Gastroenterology;  Laterality: N/A;  DM, WHEELCHAIR, PLAVIX  CORONARY STENT INTERVENTION N/A 11/24/2022   Procedure: CORONARY STENT INTERVENTION;  Surgeon: Iran Ouch, MD;  Location: [facility];  Service: Cardiovascular;  Laterality: N/A;   ESOPHAGOGASTRODUODENOSCOPY (EGD) WITH PROPOFOL N/A 09/26/2021   Procedure: ESOPHAGOGASTRODUODENOSCOPY (EGD) WITH PROPOFOL;  Surgeon: Jaynie Collins, DO;  Location: [facility] ENDOSCOPY;  Service: Gastroenterology;  Laterality: N/A;   INCISION AND DRAINAGE OF WOUND Left 06/07/2019   Procedure: IRRIGATION AND DEBRIDEMENT WOUND;  Surgeon: Recardo Evangelist, DPM;  Location: [facility] OR[facility];  Service: Podiatry;   Laterality: Left;   KNEE ARTHROPLASTY Right 11/13/2021   Procedure: COMPUTER ASSISTED TOTAL KNEE ARTHROPLASTY;  Surgeon: Donato Heinz, MD;  Location: [facility] OR[facility];  Service: Orthopedics;  Laterality: Right;   LEFT HEART CATH AND CORONARY ANGIOGRAPHY Right 11/24/2022   Procedure: LEFT HEART CATH AND CORONARY ANGIOGRAPHY with possible coronary intervention;  Surgeon: Laurier Nancy, MD;  Location: [facility];  Service: Cardiovascular;  Laterality: Right;   LOWER EXTREMITY ANGIOGRAPHY Left 06/03/2019   Procedure: Lower Extremity Angiography;  Surgeon: Renford Dills, MD;  Location: [facility];  Service: Cardiovascular;  Laterality: Left;   LOWER EXTREMITY ANGIOGRAPHY Right 05/21/2021   Procedure: LOWER EXTREMITY ANGIOGRAPHY;  Surgeon: Renford Dills, MD;  Location: [facility];  Service: Cardiovascular;  Laterality: Right;   PERIPHERAL VASCULAR BALLOON ANGIOPLASTY Left 05/16/2019   Procedure: PERIPHERAL VASCULAR BALLOON ANGIOPLASTY;  Surgeon: Runell Gess, MD;  Location: [facility];  Service: Cardiovascular;  Laterality: Left;  SFA   TUBAL LIGATION  1972   VEIN LIGATION AND STRIPPING     Family History  Problem Relation Age of Onset   Arthritis Mother    Heart attack Father    Cancer Sister    Cancer - Prostate Brother    Colon cancer Neg Hx    Breast cancer Neg Hx    Social History   Socioeconomic History   Marital status: Married    Spouse name: [name]   Number of children: 2   Years of education: Not on file   Highest education level: Not on file  Occupational History   Occupation: retired    Comment: Market researcher  Tobacco Use   Smoking status: Former    Current packs/day: 0.00    Average packs/day: 0.5 packs/day for 15.0 years (7.5 ttl pk-yrs)    Types: Cigarettes    Start date: 05/12/1973    Quit date: 05/12/1988    Years since quitting: 35.0   Smokeless tobacco: Never  Vaping Use   Vaping status: Never Used   Substance and Sexual Activity   Alcohol use: No    Alcohol/week: 0.0 standard drinks of alcohol   Drug use: No   Sexual activity: Yes  Other Topics Concern   Not on file  Social History Narrative   Lives at home with spouse Christen Bame    Social Drivers of Health   Financial Resource Strain: Low Risk  (01/15/2023)   Overall Financial Resource Strain (CARDIA)    Difficulty of Paying Living Expenses: Not hard at all  Food Insecurity: No Food Insecurity (04/07/2023)   Hunger Vital Sign    Worried About Running Out of Food in the Last Year: Never true    Ran Out of Food in the Last Year: Never true  Transportation Needs: No Transportation Needs (04/07/2023)   PRAPARE - Administrator, Civil Service (Medical): No    Lack of Transportation (Non-Medical): No  Physical Activity: Unknown (01/15/2023)  Exercise Vital Sign    Days of Exercise per Week: 7 days    Minutes of Exercise per Session: Not on file  Stress: No Stress Concern Present (01/15/2023)   [employer] of Occupational Health - Occupational Stress Questionnaire    Feeling of Stress : Not at all  Social Connections: Moderately Integrated (01/15/2023)   Social Connection and Isolation Panel [NHANES]    Frequency of Communication with Friends and Family: More than three times a week    Frequency of Social Gatherings with Friends and Family: Twice a week    Attends Religious Services: More than 4 times per year    Active Member of [employer] or Organizations: No    Attends Banker Meetings: Never    Marital Status: Married     Review of Systems  Constitutional:  Negative for appetite change and fever.  HENT:  Negative for congestion and sinus pressure.   Respiratory:  Negative for cough and chest tightness.        Breathing stable.   Cardiovascular:  Negative for chest pain and palpitations.  Gastrointestinal:  Negative for abdominal pain, diarrhea, nausea and vomiting.  Genitourinary:  Negative for  difficulty urinating and dysuria.  Musculoskeletal:  Negative for joint swelling and myalgias.  Skin:  Negative for color change and rash.  Neurological:  Negative for dizziness and headaches.  Psychiatric/Behavioral:  Negative for agitation and dysphoric mood.        Objective:     BP 128/70   Pulse 67   Temp 98 F (36.7 C)   Resp 16   Ht 5\' 3"  (1.6 m)   Wt 192 lb (87.1 kg)   LMP 05/25/1988   SpO2 99%   BMI 34.01 kg/m  Wt Readings from Last 3 Encounters:  06/02/23 192 lb (87.1 kg)  04/13/23 199 lb 11.8 oz (90.6 kg)  03/25/23 202 lb (91.6 kg)    Physical Exam Vitals reviewed.  Constitutional:      General: She is not in acute distress.    Appearance: Normal appearance.  HENT:     Head: Normocephalic and atraumatic.     Right Ear: External ear normal.     Left Ear: External ear normal.     Mouth/Throat:     Pharynx: No oropharyngeal exudate or posterior oropharyngeal erythema.  Eyes:     General: No scleral icterus.       Right eye: No discharge.        Left eye: No discharge.     Conjunctiva/sclera: Conjunctivae normal.  Neck:     Thyroid: No thyromegaly.  Cardiovascular:     Rate and Rhythm: Normal rate and regular rhythm.  Pulmonary:     Effort: No respiratory distress.     Breath sounds: Normal breath sounds. No wheezing.  Abdominal:     General: Bowel sounds are normal.     Palpations: Abdomen is soft.     Tenderness: There is no abdominal tenderness.  Musculoskeletal:        General: No tenderness.     Cervical back: Neck supple. No tenderness.     Comments: No increased swelling. Overall improved.   Lymphadenopathy:     Cervical: No cervical adenopathy.  Skin:    Findings: No erythema or rash.  Neurological:     Mental Status: She is alert.  Psychiatric:        Mood and Affect: Mood normal.        Behavior: Behavior normal.  Outpatient Encounter Medications as of 06/02/2023  Medication Sig   acetaminophen (TYLENOL) 650 MG CR  tablet Take 650-1,300 mg by mouth every 8 (eight) hours as needed for pain.   allopurinol (ZYLOPRIM) 100 MG tablet Take 1 tablet (100 mg total) by mouth daily.   amLODipine (NORVASC) 5 MG tablet Take 1 tablet (5 mg total) by mouth daily.   aspirin EC 81 MG tablet Take 1 tablet (81 mg total) by mouth daily. Swallow whole.   atorvastatin (LIPITOR) 80 MG tablet Take 1 tablet (80 mg total) by mouth daily. (Patient taking differently: Take 40 mg by mouth daily.)   clopidogrel (PLAVIX) 75 MG tablet Take 1 tablet (75 mg total) by mouth daily with breakfast.   fexofenadine (ALLEGRA) 180 MG tablet Take 1 tablet (180 mg total) by mouth daily. (Patient taking differently: Take 180 mg by mouth daily as needed for allergies.)   folic acid (FOLVITE) 1 MG tablet Take 1 mg by mouth every evening.    glucose blood (BAYER CONTOUR TEST) test strip USE  STRIP TO CHECK GLUCOSE TWICE DAILY. Dx: E11.9   ipratropium (ATROVENT) 0.06 % nasal spray Place 2 sprays into both nostrils 4 (four) times daily. (Patient taking differently: Place 2 sprays into both nostrils 4 (four) times daily as needed for rhinitis.)   magnesium oxide (MAG-OX) 400 MG tablet Take 1 tablet (400 mg total) by mouth 2 (two) times daily.   metFORMIN (GLUCOPHAGE) 500 MG tablet Take 1 tablet (500 mg total) by mouth daily.   methotrexate (RHEUMATREX) 2.5 MG tablet Take 10 mg by mouth every Wednesday.   metoprolol succinate (TOPROL-XL) 100 MG 24 hr tablet Take 1 tablet (100 mg total) by mouth daily. Take with or immediately following a meal.   MICROLET LANCETS MISC Check sugar once daily, [employer] Microlet Lancets. Dx E11.9   nitroGLYCERIN (NITROSTAT) 0.4 MG SL tablet Place 1 tablet (0.4 mg total) under the tongue every 5 (five) minutes x 3 doses as needed for chest pain.   pantoprazole (PROTONIX) 40 MG tablet Take 1 tablet (40 mg total) by mouth daily.   spironolactone (ALDACTONE) 25 MG tablet Take 1 tablet (25 mg total) by mouth daily.   torsemide  (DEMADEX) 20 MG tablet Take 1 tablet (20 mg total) by mouth daily. Taking 1/2 tablet just as needed   traMADol (ULTRAM) 50 MG tablet Take 1 tablet (50 mg total) by mouth every 8 (eight) hours as needed.   [DISCONTINUED] allopurinol (ZYLOPRIM) 100 MG tablet Take 1 tablet (100 mg total) by mouth daily.   [DISCONTINUED] amLODipine (NORVASC) 5 MG tablet Take 1 tablet (5 mg total) by mouth daily.   [DISCONTINUED] clopidogrel (PLAVIX) 75 MG tablet Take 1 tablet (75 mg total) by mouth daily with breakfast.   [DISCONTINUED] magnesium oxide (MAG-OX) 400 MG tablet Take 1 tablet (400 mg total) by mouth 2 (two) times daily.   [DISCONTINUED] metFORMIN (GLUCOPHAGE) 500 MG tablet Take 1 tablet by mouth once daily   [DISCONTINUED] metoprolol succinate (TOPROL-XL) 100 MG 24 hr tablet Take 1 tablet (100 mg total) by mouth daily. Take with or immediately following a meal.   [DISCONTINUED] pantoprazole (PROTONIX) 40 MG tablet Take 1 tablet by mouth once daily   [DISCONTINUED] spironolactone (ALDACTONE) 25 MG tablet Take 1 tablet by mouth once daily   No facility-administered encounter medications on file as of 06/02/2023.     Lab Results  Component Value Date   WBC 9.2 06/02/2023   HGB 13.4 06/02/2023   HCT 41.9 06/02/2023  PLT 223.0 06/02/2023   GLUCOSE 146 (H) 06/02/2023   CHOL 95 03/02/2023   TRIG 125.0 03/02/2023   HDL 41.20 03/02/2023   LDLCALC 29 03/02/2023   ALT 14 04/07/2023   AST 19 04/07/2023   NA 139 06/02/2023   K 4.0 06/02/2023   CL 100 06/02/2023   CREATININE 1.68 (H) 06/02/2023   BUN 39 (H) 06/02/2023   CO2 29 06/02/2023   TSH 4.01 03/02/2023   INR 1.1 11/21/2022   HGBA1C 6.3 03/02/2023   MICROALBUR 17.6 (H) 12/19/2021    ECHOCARDIOGRAM COMPLETE BUBBLE STUDY Result Date: 04/07/2023    ECHOCARDIOGRAM REPORT   Patient Name:   BERNARDETTE WALDRON Date of Exam: 04/07/2023 Medical Rec #:  [id number]    Height:       66.0 in Accession #:    [id number]   Weight:       202.0 lb Date of Birth:   November 26, 1940    BSA:          2.008 m Patient Age:    82 years     BP:           105/52 mmHg Patient Gender: F            HR:           61 bpm. Exam Location:  [facility] Procedure: 2D Echo, Cardiac Doppler, Color Doppler and Saline Contrast Bubble            Study Indications:     Syncope  History:         Patient has prior history of Echocardiogram examinations, most                  recent 11/23/2022. Cardiomyopathy and CHF, CAD and Previous                  Myocardial Infarction, Abnormal ECG, Pulmonary HTN,                  Signs/Symptoms:Syncope, Dizziness/Lightheadedness, Murmur and                  Shortness of Breath; Risk Factors:Hypertension, Diabetes and                  Dyslipidemia. CKD.  Sonographer:     Mikki Harbor Referring Phys:  [id number] Eliezer Mccoy PATEL Diagnosing Phys: Yvonne Kendall MD  Sonographer Comments: Image acquisition challenging due to patient body habitus. IMPRESSIONS  1. Left ventricular ejection fraction, by estimation, is 50 to 55%. The left ventricle has low normal function. The left ventricle has no regional wall motion abnormalities. There is mild left ventricular hypertrophy. Left ventricular diastolic parameters are consistent with Grade I diastolic dysfunction (impaired relaxation). Elevated left atrial pressure.  2. Right ventricular systolic function is mildly reduced. The right ventricular size is mildly enlarged. Severely increased right ventricular wall thickness. Tricuspid regurgitation signal is inadequate for assessing PA pressure.  3. Left atrial size was mild to moderately dilated.  4. The mitral valve is degenerative. Mild to moderate mitral valve regurgitation. No evidence of mitral stenosis.  5. The aortic valve is tricuspid. There is mild thickening of the aortic valve. Aortic valve regurgitation is mild. Aortic valve sclerosis is present, with no evidence of aortic valve stenosis.  6. The inferior vena cava is normal in size with <50% respiratory variability, suggesting  right atrial pressure of 8 mmHg.  7. Agitated saline contrast bubble study was negative, with no evidence of any interatrial  shunt. FINDINGS  Left Ventricle: Left ventricular ejection fraction, by estimation, is 50 to 55%. The left ventricle has low normal function. The left ventricle has no regional wall motion abnormalities. The left ventricular internal cavity size was normal in size. There is mild left ventricular hypertrophy. Left ventricular diastolic parameters are consistent with Grade I diastolic dysfunction (impaired relaxation). Elevated left atrial pressure. Right Ventricle: The right ventricular size is mildly enlarged. Severely increased right ventricular wall thickness. Right ventricular systolic function is mildly reduced. Tricuspid regurgitation signal is inadequate for assessing PA pressure. Left At[facility]: Left atrial size was mild to moderately dilated. Right [facility]: Right atrial size was normal in size. Pericardium: There is no evidence of pericardial effusion. Mitral Valve: The mitral valve is degenerative in appearance. There is mild thickening of the mitral valve leaflet(s). There is mild calcification of the mitral valve leaflet(s). Mild to moderate mitral annular calcification. Mild to moderate mitral valve regurgitation. No evidence of mitral valve stenosis. MV peak gradient, 11.6 mmHg. The mean mitral valve gradient is 4.0 mmHg. Tricuspid Valve: The tricuspid valve is not well visualized. Tricuspid valve regurgitation is not demonstrated. Aortic Valve: The aortic valve is tricuspid. There is mild thickening of the aortic valve. Aortic valve regurgitation is mild. Aortic valve sclerosis is present, with no evidence of aortic valve stenosis. Aortic valve mean gradient measures 8.0 mmHg. Aortic valve peak gradient measures 15.7 mmHg. Aortic valve area, by VTI measures 2.03 cm. Pulmonic Valve: The pulmonic valve was not well visualized. Pulmonic valve regurgitation is trivial. No evidence of  pulmonic stenosis. Aorta: The aortic root and ascending aorta are structurally normal, with no evidence of dilitation. Pulmonary Artery: The pulmonary artery is not well seen. Venous: The inferior vena cava is normal in size with less than 50% respiratory variability, suggesting right atrial pressure of 8 mmHg. IAS/Shunts: The interatrial septum was not well visualized. Agitated saline contrast was given intravenously to evaluate for intracardiac shunting. Agitated saline contrast bubble study was negative, with no evidence of any interatrial shunt.  LEFT VENTRICLE PLAX 2D LVIDd:         4.00 cm   Diastology LVIDs:         2.90 cm   LV e' medial:    5.22 cm/s LV PW:         1.15 cm   LV E/e' medial:  18.6 LV IVS:        1.18 cm   LV e' lateral:   7.07 cm/s LVOT diam:     2.00 cm   LV E/e' lateral: 13.7 LV SV:         97 LV SV Index:   48 LVOT Area:     3.14 cm  RIGHT VENTRICLE RV Basal diam:  3.65 cm RV Mid diam:    3.60 cm RV S prime:     12.00 cm/s LEFT ATRIUM              Index        RIGHT ATRIUM           Index LA diam:        4.20 cm  2.09 cm/m   RA Area:     16.20 cm LA Vol (A2C):   111.0 ml 55.27 ml/m  RA Volume:   37.40 ml  18.62 ml/m LA Vol (A4C):   69.0 ml  34.36 ml/m LA Biplane Vol: 87.9 ml  43.77 ml/m  AORTIC VALVE  PULMONIC VALVE AV Area (Vmax):    2.09 cm      PV Vmax:       0.91 m/s AV Area (Vmean):   2.07 cm      PV Peak grad:  3.3 mmHg AV Area (VTI):     2.03 cm AV Vmax:           198.00 cm/s AV Vmean:          127.000 cm/s AV VTI:            0.479 m AV Peak Grad:      15.7 mmHg AV Mean Grad:      8.0 mmHg LVOT Vmax:         132.00 cm/s LVOT Vmean:        83.600 cm/s LVOT VTI:          0.309 m LVOT/AV VTI ratio: 0.65  AORTA Ao Root diam: 3.50 cm Ao Asc diam:  2.90 cm MITRAL VALVE MV Area (PHT): 2.86 cm     SHUNTS MV Area VTI:   2.42 cm     Systemic VTI:  0.31 m MV Peak grad:  11.6 mmHg    Systemic Diam: 2.00 cm MV Mean grad:  4.0 mmHg MV Vmax:       1.70 m/s MV Vmean:       96.8 cm/s MV Decel Time: 265 msec MV E velocity: 96.90 cm/s MV A velocity: 144.00 cm/s MV E/A ratio:  0.67 Cristal Deer End MD Electronically signed by Yvonne Kendall MD Signature Date/Time: 04/07/2023/6:36:25 PM    Final    NM Pulmonary Perfusion Result Date: 04/07/2023 CLINICAL DATA:  Pulmonary embolism (PE) suspected, high prob EXAM: NUCLEAR MEDICINE PERFUSION LUNG SCAN TECHNIQUE: Perfusion images were obtained in multiple projections after intravenous injection of radiopharmaceutical. Ventilation scans intentionally deferred if perfusion scan and chest x-ray adequate for interpretation during COVID 19 epidemic. RADIOPHARMACEUTICALS:  4.38 mCi Tc-38m MAA IV COMPARISON:  X-ray 04/07/2023 FINDINGS: Heterogeneous distribution of radiotracer within both lungs. Scattered small peripheral subsegmental perfusion defects. No segmental or larger mismatched perfusion defect. IMPRESSION: Low probability for pulmonary embolism. Electronically Signed   By: Duanne Guess D.O.   On: 04/07/2023 10:38   DG Chest 2 View Result Date: 04/07/2023 CLINICAL DATA:  Shortness of breath. EXAM: CHEST - 2 VIEW COMPARISON:  11/21/2022. FINDINGS: Minimal atelectasis/scarring noted overlying the left lateral costophrenic angle. Bilateral lung fields are otherwise clear. No acute consolidation or lung collapse. Bilateral costophrenic angles are clear. Stable cardio-mediastinal silhouette. No acute osseous abnormalities. The soft tissues are within normal limits. IMPRESSION: *No active cardiopulmonary disease. Electronically Signed   By: Jules Schick M.D.   On: 04/07/2023 10:19   MR KNEE RIGHT WO CONTRAST Result Date: 04/07/2023 CLINICAL DATA:  Possible soft tissue infection, fall. EXAM: MRI OF THE RIGHT KNEE WITHOUT CONTRAST TECHNIQUE: Multiplanar, multisequence MR imaging of the knee was performed. No intravenous contrast was administered. COMPARISON:  Radiograph 11/13/2021 FINDINGS: The patient has a total knee  prosthesis in place with associated metal artifact. MENISCI Medial meniscus:  N/A Lateral meniscus:  N/A LIGAMENTS Cruciates:  N/A Collaterals:  Intact where visible. CARTILAGE Patellofemoral:  N/A Medial:  N/A Lateral:  N/A Joint:  Small effusion in the suprapatellar bursa. Popliteal Fossa: Very small Baker's cyst. Small amount of infiltrative edema along the popliteal space. Extensor Mechanism:  Unremarkable Bones: Subtle marrow edema the proximal fibula without definite fracture, potentially from mild stress reaction or arthropathy. Other: Focal infiltration of the subcutaneous edema anterolateral  to the proximal tibia for example on image 25 series 11, likely representing bruising. No large hematoma in this vicinity. There is mild posterior and and anterolateral subcutaneous edema tracking along the knee in into the calf. No drainable abscess. IMPRESSION: 1. Focal edema of the subcutaneous edema anterolateral to the proximal tibia, likely representing bruising. No large hematoma in this vicinity. 2. Mild posterior and anterolateral subcutaneous edema tracking along the knee in into the calf. No drainable abscess. 3. Subtle marrow edema in the proximal fibula without definite fracture, potentially from mild stress reaction or arthropathy. 4. Small effusion in the suprapatellar bursa. Very small Baker's cyst. 5. 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 Electronically Signed   By: Gaylyn Rong M.D.   On: 04/07/2023 08:10   MR KNEE LEFT WO CONTRAST Result Date: 04/07/2023 CLINICAL DATA:  Fall. Knee pain and trauma. Reduced mobility. Swelling in both knees. EXAM: MRI OF THE LEFT KNEE WITHOUT CONTRAST TECHNIQUE: Multiplanar, multisequence MR imaging of the knee was performed. No intravenous contrast was administered. COMPARISON:  Report from radiographs dated 04/02/2023 FINDINGS: MENISCI Medial meniscus: Prominent and mostly degenerative tearing of the medial meniscus with grade 3  signal extending obliquely to the inferior surface in the anterior and posterior horns spanning nearly the entire length of the meniscus, and also abnormal grade 3 extension to the superior surface in the anterior horn on image 55 series 18. Lateral meniscus: Degenerative tearing of the posterior horn and midbody with hazy accentuated signal throughout the diminutive visualized meniscus, and peripheral extrusion. Likely degenerative tearing in the anterior horn especially involving the central/free edge component of the anterior horn. LIGAMENTS Cruciates:  Unremarkable Collaterals: Mild proximal popliteus tendinopathy. Otherwise unremarkable. CARTILAGE Patellofemoral: Generally moderate degenerative chondral thinning although with prominent chondral thinning superiorly along the posterior patellar ridge and adjacent lateral facet, with underlying small subcortical foci of edema. Medial: Moderate to prominent degenerative chondral thinning with marginal spurring. Lateral: Severe degenerative chondral thinning with marginal spurring. Joint:  Small knee effusion. Popliteal Fossa: Infiltrative edema in the popliteal space. Small Baker's cyst. Extensor Mechanism:  Unremarkable Bones: No significant extra-articular osseous abnormalities identified. Other: 10.1 by 1.9 by 11.4 cm (volume = 110 cm^3) anterolateral subcutaneous hematoma along the knee and proximal shin. IMPRESSION: 1. Large anterolateral subcutaneous hematoma along the knee and proximal shin, with a volume of 110 cc. 2. Prominent and mostly degenerative tearing of the medial meniscus. 3. Degenerative tearing of the lateral meniscus, with peripheral extrusion. 4. 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 5. Small knee effusion. Small Baker's cyst. 6. Mild proximal popliteus tendinopathy. Electronically Signed   By: Gaylyn Rong M.D.   On: 04/07/2023 08:05   US Venous  Img Lower Bilateral (DVT) Result Date: 04/07/2023 CLINICAL DATA:  Pain and swelling left lower leg. EXAM: Bilateral LOWER EXTREMITY VENOUS DOPPLER ULTRASOUND TECHNIQUE: Gray-scale sonography with compression, as well as color and duplex ultrasound, were performed to evaluate the deep venous system(s) from the level of the common femoral vein through the popliteal and proximal calf veins. COMPARISON:  Ultrasound 06/16/2018 FINDINGS: VENOUS Normal compressibility of the common femoral, superficial femoral, and popliteal veins, as well as the visualized calf veins. Visualized portions of profunda femoral vein and great saphenous vein unremarkable. No filling defects to suggest DVT on grayscale or color Doppler imaging. Doppler waveforms show normal direction of venous flow, normal respiratory plasticity and response to augmentation. Limited views of the contralateral common femoral vein  are unremarkable. OTHER Subcutaneous edema in both calves. Limitations: none IMPRESSION: Negative for acute DVT. Electronically Signed   By: Minerva Fester M.D.   On: 04/07/2023 01:28       Assessment & Plan:  Type 2 diabetes mellitus with hyperlipidemia [facility]) Assessment & Plan: On metformin currently. Last GFR we have - ok. Continue Low carb diet and exercise.  Follow met b and a1c.  Recheck today.   Orders: -     Basic metabolic panel  Rheumatoid arthritis, involving unspecified site, unspecified whether rheumatoid factor present (HCC) Assessment & Plan: On MTX.  Followed by rheumatology.    Hypercholesterolemia Assessment & Plan: Continue lipitor.  Low cholesterol diet and exercise.  Follow lipid panel and liver function tests.     Primary hypertension Assessment & Plan: Continue metoprolol, losartan and amlodipine.  Spironolactone added during recent hospitalization. Follow pressures.  Follow metabolic panel.    Anemia, unspecified type -     CBC with Differential/Platelet -     Vitamin B12 -     IBC  + Ferritin  Thrombocytopenia (HCC) Assessment & Plan: Follow cbc.    Swelling of lower extremity Assessment & Plan: Swelling improved.  Follow.    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 Otherwise unremarkable study.  Has seen Dr Lonna Cobb.    Renal angiolipoma Assessment & Plan: Dr Lonna Cobb (01/30/22) - stable.  F/u one year. Just had f/u renal ultrasound 02/2023 - no interval change.     Pulmonary hypertension, mild (HCC) Assessment & Plan: Continue f/u with pulmonary/cardiology.    Gastroesophageal reflux disease with esophagitis without hemorrhage Assessment & Plan: No upper symptoms reported.  Continue protonix.    Generalized weakness Assessment & Plan: After recent hospitalization and SNF, need to continue PT - to help with strengthening.  HH PT.    CREST variant of scleroderma (HCC) Assessment & Plan: Followed by [facility]/rheumatology.    Stage 3a chronic kidney disease (HCC) Assessment & Plan: Avoid antiinflammatories.  Stay hydrated.  Follow metabolic panel. Continue losartan. Check metabolic panel today.    Cardiomyopathy, unspecified type [facility]) Assessment & Plan: Echocardiogram 11/2022 - EF 50-55% with grade III DD, right and left atrium moderately dilated with mild MR and mild AR. Continue spironolactone, metoprolol, losartan. Weight down.    Coronary artery disease involving native coronary artery of native heart without angina pectoris Assessment & Plan: Cardiology (Dr Azucena Cecil). CAD s/p PCI to RV marginal branch 11/2022 (70% OM1, 80% second diagonal).  EF 50 to 55%.  Continue aspirin, Plavix, Lipitor 80.     Atherosclerosis of native artery of right lower extremity with rest pain [facility]) Assessment & Plan: S/p angiogram, angioplasty and toe amputation.  Continue lipitor and plavix. Doing well.     Aortic atherosclerosis (HCC) Assessment &  Plan: Continue lipitor.    Acquired absence of other left toe(s) [facility]) Assessment & Plan: S/p amputation.  Followed by [facility].    Open wound of knee, left, subsequent encounter Assessment & Plan: Overall wound improved. Scabbed over. No surrounding erythema. Healing. Follow.    Other orders -     Allopurinol; Take 1 tablet (100 mg total) by mouth daily.  Dispense: 90 tablet; Refill: 1 -     amLODIPine Besylate; Take 1 tablet (5 mg total) by mouth daily.  Dispense: 90 tablet; Refill: 1 -     Clopidogrel Bisulfate; Take 1 tablet (75 mg total) by mouth daily with breakfast.  Dispense: 90 tablet; Refill: 1 -     Magnesium Oxide; Take 1 tablet (400 mg total) by mouth 2 (two) times daily.  Dispense: 180 tablet; Refill: 1 -     metFORMIN HCl; Take 1 tablet (500 mg total) by mouth daily.  Dispense: 90 tablet; Refill: 1 -     Metoprolol Succinate ER; Take 1 tablet (100 mg total) by mouth daily. Take with or immediately following a meal.  Dispense: 90 tablet; Refill: 1 -     Pantoprazole Sodium; Take 1 tablet (40 mg total) by mouth daily.  Dispense: 90 tablet; Refill: 3 -     Spironolactone; Take 1 tablet (25 mg total) by mouth daily.  Dispense: 90 tablet; Refill: 0     Dale Valparais[place], MD

## 2023-06-03 LAB — CBC WITH DIFFERENTIAL/PLATELET
Basophils Absolute: 0 10*3/uL (ref 0.0–0.1)
Basophils Relative: 0 % (ref 0.0–3.0)
Eosinophils Absolute: 0 10*3/uL (ref 0.0–0.7)
Eosinophils Relative: 0.3 % (ref 0.0–5.0)
HCT: 41.9 % (ref 36.0–46.0)
Hemoglobin: 13.4 g/dL (ref 12.0–15.0)
Lymphocytes Relative: 10.1 % — ABNORMAL LOW (ref 12.0–46.0)
Lymphs Abs: 0.9 10*3/uL (ref 0.7–4.0)
MCHC: 32 g/dL (ref 30.0–36.0)
MCV: 106.7 fL — ABNORMAL HIGH (ref 78.0–100.0)
Monocytes Absolute: 0.8 10*3/uL (ref 0.1–1.0)
Monocytes Relative: 8.3 % (ref 3.0–12.0)
Neutro Abs: 7.5 10*3/uL (ref 1.4–7.7)
Neutrophils Relative %: 81.3 % — ABNORMAL HIGH (ref 43.0–77.0)
Platelets: 223 10*3/uL (ref 150.0–400.0)
RBC: 3.93 Mil/uL (ref 3.87–5.11)
RDW: 19.1 % — ABNORMAL HIGH (ref 11.5–15.5)
WBC: 9.2 10*3/uL (ref 4.0–10.5)

## 2023-06-03 LAB — IBC + FERRITIN
Ferritin: 49.8 ng/mL (ref 10.0–291.0)
Iron: 69 ug/dL (ref 42–145)
Saturation Ratios: 20.9 % (ref 20.0–50.0)
TIBC: 330.4 ug/dL (ref 250.0–450.0)
Transferrin: 236 mg/dL (ref 212.0–360.0)

## 2023-06-03 LAB — BASIC METABOLIC PANEL
BUN: 39 mg/dL — ABNORMAL HIGH (ref 6–23)
CO2: 29 meq/L (ref 19–32)
Calcium: 10.4 mg/dL (ref 8.4–10.5)
Chloride: 100 meq/L (ref 96–112)
Creatinine, Ser: 1.68 mg/dL — ABNORMAL HIGH (ref 0.40–1.20)
GFR: 28.25 mL/min — ABNORMAL LOW (ref >60.00)
Glucose, Bld: 146 mg/dL — ABNORMAL HIGH (ref 70–99)
Potassium: 4 meq/L (ref 3.5–5.1)
Sodium: 139 meq/L (ref 135–145)

## 2023-06-03 LAB — VITAMIN B12: Vitamin B-12: 518 pg/mL (ref 211–911)

## 2023-06-04 ENCOUNTER — Other Ambulatory Visit: Payer: Self-pay | Admitting: Internal Medicine

## 2023-06-04 DIAGNOSIS — R944 Abnormal results of kidney function studies: Secondary | ICD-10-CM

## 2023-06-04 NOTE — Progress Notes (Signed)
Order placed for f/u met b.

## 2023-06-07 ENCOUNTER — Encounter: Payer: Self-pay | Admitting: Internal Medicine

## 2023-06-07 DIAGNOSIS — S81002D Unspecified open wound, left knee, subsequent encounter: Secondary | ICD-10-CM | POA: Insufficient documentation

## 2023-06-07 NOTE — Assessment & Plan Note (Signed)
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 Otherwise unremarkable study.  Has seen Dr Lonna Cobb.

## 2023-06-07 NOTE — Assessment & Plan Note (Signed)
Continue f/u with pulmonary/cardiology.

## 2023-06-07 NOTE — Assessment & Plan Note (Signed)
Follow cbc.

## 2023-06-07 NOTE — Assessment & Plan Note (Signed)
After recent hospitalization and SNF, need to continue PT - to help with strengthening.  HH PT.

## 2023-06-07 NOTE — Assessment & Plan Note (Signed)
Overall wound improved. Scabbed over. No surrounding erythema. Healing. Follow.

## 2023-06-07 NOTE — Assessment & Plan Note (Signed)
Continue lipitor.  Low cholesterol diet and exercise.  Follow lipid panel and liver function tests.

## 2023-06-07 NOTE — Assessment & Plan Note (Signed)
Followed by [facility]/rheumatology.

## 2023-06-07 NOTE — Assessment & Plan Note (Signed)
S/p angiogram, angioplasty and toe amputation.  Continue lipitor and plavix. Doing well.

## 2023-06-07 NOTE — Assessment & Plan Note (Signed)
Avoid antiinflammatories.  Stay hydrated.  Follow metabolic panel. Continue losartan. Check metabolic panel today.

## 2023-06-07 NOTE — Assessment & Plan Note (Signed)
On MTX.  Followed by rheumatology.

## 2023-06-07 NOTE — Assessment & Plan Note (Signed)
Dr Lonna Cobb (01/30/22) - stable.  F/u one year. Just had f/u renal ultrasound 02/2023 - no interval change.

## 2023-06-07 NOTE — Assessment & Plan Note (Signed)
No upper symptoms reported.  Continue protonix.

## 2023-06-07 NOTE — Assessment & Plan Note (Signed)
Swelling improved.  Follow.

## 2023-06-07 NOTE — Assessment & Plan Note (Signed)
S/p amputation.  Followed by [facility].  ?

## 2023-06-07 NOTE — Assessment & Plan Note (Signed)
On metformin currently. Last GFR we have - ok. Continue Low carb diet and exercise.  Follow met b and a1c.  Recheck today.

## 2023-06-07 NOTE — Assessment & Plan Note (Signed)
Continue lipitor  ?

## 2023-06-07 NOTE — Assessment & Plan Note (Signed)
Cardiology (Dr Azucena Cecil). CAD s/p PCI to RV marginal branch 11/2022 (70% OM1, 80% second diagonal).  EF 50 to 55%.  Continue aspirin, Plavix, Lipitor 80.

## 2023-06-07 NOTE — Assessment & Plan Note (Signed)
Continue metoprolol, losartan and amlodipine.  Spironolactone added during recent hospitalization. Follow pressures.  Follow metabolic panel.

## 2023-06-07 NOTE — Assessment & Plan Note (Signed)
Echocardiogram 11/2022 - EF 50-55% with grade III DD, right and left atrium moderately dilated with mild MR and mild AR. Continue spironolactone, metoprolol, losartan. Weight down.

## 2023-06-08 ENCOUNTER — Other Ambulatory Visit (INDEPENDENT_AMBULATORY_CARE_PROVIDER_SITE_OTHER): Payer: Medicare Other

## 2023-06-08 DIAGNOSIS — R944 Abnormal results of kidney function studies: Secondary | ICD-10-CM

## 2023-06-08 LAB — BASIC METABOLIC PANEL
BUN: 19 mg/dL (ref 6–23)
CO2: 27 meq/L (ref 19–32)
Calcium: 9.7 mg/dL (ref 8.4–10.5)
Chloride: 104 meq/L (ref 96–112)
Creatinine, Ser: 1.14 mg/dL (ref 0.40–1.20)
GFR: 44.98 mL/min — ABNORMAL LOW (ref >60.00)
Glucose, Bld: 168 mg/dL — ABNORMAL HIGH (ref 70–99)
Potassium: 4.3 meq/L (ref 3.5–5.1)
Sodium: 136 meq/L (ref 135–145)

## 2023-06-09 ENCOUNTER — Telehealth: Payer: Self-pay | Admitting: Internal Medicine

## 2023-06-09 NOTE — Telephone Encounter (Signed)
Pt will remain off the metformin and continue to monitor sugars

## 2023-06-09 NOTE — Telephone Encounter (Signed)
Copied from CRM [id number]. Topic: General - Other >> Jun 09, 2023  7:59 AM Pascal Lux wrote: Reason for CRM: Patient called and said yesterday at her appointment she brought a piece of paper with her sugar level per request of Provider and left it in the lab. Patient would like to know if Dr. Lorin Picket was able to receive the paperwork.

## 2023-06-24 ENCOUNTER — Encounter (INDEPENDENT_AMBULATORY_CARE_PROVIDER_SITE_OTHER): Payer: Medicare Other

## 2023-06-24 ENCOUNTER — Encounter (INDEPENDENT_AMBULATORY_CARE_PROVIDER_SITE_OTHER): Payer: Medicare Other | Admitting: Nurse Practitioner

## 2023-06-24 ENCOUNTER — Other Ambulatory Visit: Payer: Medicare Other

## 2023-06-24 DIAGNOSIS — E119 Type 2 diabetes mellitus without complications: Secondary | ICD-10-CM | POA: Diagnosis not present

## 2023-06-24 DIAGNOSIS — E7800 Pure hypercholesterolemia, unspecified: Secondary | ICD-10-CM

## 2023-06-24 LAB — BASIC METABOLIC PANEL
BUN: 16 mg/dL (ref 6–23)
CO2: 26 meq/L (ref 19–32)
Calcium: 9.5 mg/dL (ref 8.4–10.5)
Chloride: 106 meq/L (ref 96–112)
Creatinine, Ser: 1.1 mg/dL (ref 0.40–1.20)
GFR: 46.93 mL/min — ABNORMAL LOW (ref >60.00)
Glucose, Bld: 114 mg/dL — ABNORMAL HIGH (ref 70–99)
Potassium: 4.2 meq/L (ref 3.5–5.1)
Sodium: 139 meq/L (ref 135–145)

## 2023-06-24 LAB — LIPID PANEL
Cholesterol: 93 mg/dL (ref 0–200)
HDL: 35 mg/dL — ABNORMAL LOW (ref >39.00)
LDL Cholesterol: 32 mg/dL (ref 0–99)
NonHDL: 57.91
Total CHOL/HDL Ratio: 3
Triglycerides: 130 mg/dL (ref 0.0–149.0)
VLDL: 26 mg/dL (ref 0.0–40.0)

## 2023-06-24 LAB — HEPATIC FUNCTION PANEL
ALT: 11 U/L (ref 0–35)
AST: 17 U/L (ref 0–37)
Albumin: 3.5 g/dL (ref 3.5–5.2)
Alkaline Phosphatase: 85 U/L (ref 39–117)
Bilirubin, Direct: 0.1 mg/dL (ref 0.0–0.3)
Total Bilirubin: 0.6 mg/dL (ref 0.2–1.2)
Total Protein: 6.4 g/dL (ref 6.0–8.3)

## 2023-06-24 LAB — MAGNESIUM: Magnesium: 1.4 mg/dL — ABNORMAL LOW (ref 1.5–2.5)

## 2023-06-24 LAB — HEMOGLOBIN A1C: Hgb A1c MFr Bld: 6.1 % (ref 4.6–6.5)

## 2023-06-25 LAB — MICROALBUMIN / CREATININE URINE RATIO
Creatinine,U: 100.1 mg/dL
Microalb Creat Ratio: 63.2 mg/g — ABNORMAL HIGH (ref 0.0–30.0)
Microalb, Ur: 6.3 mg/dL — ABNORMAL HIGH (ref 0.0–1.9)

## 2023-06-26 ENCOUNTER — Ambulatory Visit (INDEPENDENT_AMBULATORY_CARE_PROVIDER_SITE_OTHER): Payer: Medicare Other | Admitting: Internal Medicine

## 2023-06-26 VITALS — BP 118/68 | HR 66 | Temp 98.0°F | Resp 16 | Ht 63.0 in | Wt 200.0 lb

## 2023-06-26 DIAGNOSIS — I447 Left bundle-branch block, unspecified: Secondary | ICD-10-CM

## 2023-06-26 DIAGNOSIS — M069 Rheumatoid arthritis, unspecified: Secondary | ICD-10-CM

## 2023-06-26 DIAGNOSIS — M179 Osteoarthritis of knee, unspecified: Secondary | ICD-10-CM

## 2023-06-26 DIAGNOSIS — Z89422 Acquired absence of other left toe(s): Secondary | ICD-10-CM

## 2023-06-26 DIAGNOSIS — N281 Cyst of kidney, acquired: Secondary | ICD-10-CM

## 2023-06-26 DIAGNOSIS — M7989 Other specified soft tissue disorders: Secondary | ICD-10-CM

## 2023-06-26 DIAGNOSIS — I70221 Atherosclerosis of native arteries of extremities with rest pain, right leg: Secondary | ICD-10-CM

## 2023-06-26 DIAGNOSIS — I429 Cardiomyopathy, unspecified: Secondary | ICD-10-CM

## 2023-06-26 DIAGNOSIS — E1169 Type 2 diabetes mellitus with other specified complication: Secondary | ICD-10-CM | POA: Diagnosis not present

## 2023-06-26 DIAGNOSIS — N1831 Chronic kidney disease, stage 3a: Secondary | ICD-10-CM

## 2023-06-26 DIAGNOSIS — E1122 Type 2 diabetes mellitus with diabetic chronic kidney disease: Secondary | ICD-10-CM | POA: Diagnosis not present

## 2023-06-26 DIAGNOSIS — M341 CR(E)ST syndrome: Secondary | ICD-10-CM

## 2023-06-26 DIAGNOSIS — D696 Thrombocytopenia, unspecified: Secondary | ICD-10-CM | POA: Diagnosis not present

## 2023-06-26 DIAGNOSIS — N183 Chronic kidney disease, stage 3 unspecified: Secondary | ICD-10-CM | POA: Diagnosis not present

## 2023-06-26 DIAGNOSIS — K2270 Barrett's esophagus without dysplasia: Secondary | ICD-10-CM

## 2023-06-26 DIAGNOSIS — I700 Atherosclerosis of aorta: Secondary | ICD-10-CM

## 2023-06-26 DIAGNOSIS — K2100 Gastro-esophageal reflux disease with esophagitis, without bleeding: Secondary | ICD-10-CM

## 2023-06-26 DIAGNOSIS — L03116 Cellulitis of left lower limb: Secondary | ICD-10-CM | POA: Diagnosis not present

## 2023-06-26 DIAGNOSIS — E1151 Type 2 diabetes mellitus with diabetic peripheral angiopathy without gangrene: Secondary | ICD-10-CM

## 2023-06-26 DIAGNOSIS — I2510 Atherosclerotic heart disease of native coronary artery without angina pectoris: Secondary | ICD-10-CM

## 2023-06-26 DIAGNOSIS — E1142 Type 2 diabetes mellitus with diabetic polyneuropathy: Secondary | ICD-10-CM

## 2023-06-26 DIAGNOSIS — E7800 Pure hypercholesterolemia, unspecified: Secondary | ICD-10-CM

## 2023-06-26 DIAGNOSIS — R531 Weakness: Secondary | ICD-10-CM

## 2023-06-26 DIAGNOSIS — I272 Pulmonary hypertension, unspecified: Secondary | ICD-10-CM

## 2023-06-26 DIAGNOSIS — I10 Essential (primary) hypertension: Secondary | ICD-10-CM

## 2023-06-26 DIAGNOSIS — I129 Hypertensive chronic kidney disease with stage 1 through stage 4 chronic kidney disease, or unspecified chronic kidney disease: Secondary | ICD-10-CM | POA: Diagnosis not present

## 2023-06-26 DIAGNOSIS — Z0000 Encounter for general adult medical examination without abnormal findings: Secondary | ICD-10-CM

## 2023-06-26 DIAGNOSIS — J302 Other seasonal allergic rhinitis: Secondary | ICD-10-CM

## 2023-06-26 DIAGNOSIS — E785 Hyperlipidemia, unspecified: Secondary | ICD-10-CM

## 2023-06-26 NOTE — Progress Notes (Signed)
Subjective:    Patient ID: Nina Perez, female    DOB: 28-Aug-1940, 83 y.o.   MRN: [id number]  Patient here for  Chief Complaint  Patient presents with   Annual Exam    HPI With past history of diabetes, hypertension, CKD and hypercholesterolemia, comes in today to follow up on these issues as well as for a complete physical exam. She is accompanied by her husband. History obtained from both of them. Discussed labs.  Magnesium 1.4. A1c improved. Kidney function improved. Breathing stable. Eating. No nausea or vomiting. No abdominal pain. Persistent weakness. PT evaluating. Discussed the need to exercise regularly.    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HCC)    a.) 2/2 scleroderma Dx   LBBB (left bundle branch block)    Long term current use of antithrombotics/antiplatelets    a.) ASA + clopidogrel   Long term current use of immunosuppressive drug    a.) MTX for RA and scleroderma Dx   OSA on CPAP    Osteoarthritis    a.) knees, spine   PAD (peripheral artery disease) (HCC)    Phlebitis    a.) x 2 (with pregnancy)   Pulmonary fibrosis (HCC)    a.) mild   Pulmonary hypertension (HCC) 02/24/2012   a.) TTE 02/24/2012: EF 55%, RVSP 33; b.) TTE 10/12/2014: EF >55%, RVSP 36.1; c.) TTE 09/02/2017: EF 55%, RVSP 45.5   Reflux esophagitis    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 b.) renal US 05/23/2020: solid LEFT renal mass measuring 1.4 x 1.5 x 1.6. 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, sclerodactyly, telangiectasias   Skin cancer 2018   Supplemental oxygen dependent    a.) 2L/[place] PRN for DOE related to mild pulmonary fibrosis and pulmonary HTN   T2DM (type 2 diabetes mellitus) (HCC)    Past Surgical History:  Procedure Laterality Date   ABDOMINAL AORTOGRAM W/LOWER EXTREMITY Left 05/16/2019   Procedure: ABDOMINAL AORTOGRAM W/LOWER EXTREMITY;  Surgeon: Runell Gess, MD;  Location: [facility];  Service: Cardiovascular;  Laterality: Left;   AMPUTATION TOE Left 06/02/2019   Procedure: AMPUTATION TOE;  Surgeon: Recardo Evangelist, DPM;  Location: [facility];  Service: Podiatry;  Laterality: Left;   APPENDECTOMY  1972   BREAST CYST ASPIRATION Left    neg   COLONOSCOPY WITH PROPOFOL N/A 09/26/2021   Procedure: COLONOSCOPY WITH PROPOFOL;  Surgeon: Jaynie Collins, DO;  Location: [facility];  Service: Gastroenterology;  Laterality: N/A;  DM, WHEELCHAIR, PLAVIX   CORONARY STENT INTERVENTION N/A 11/24/2022   Procedure: CORONARY STENT INTERVENTION;  Surgeon: Iran Ouch, MD;  Location: [facility];  Service: Cardiovascular;  Laterality: N/A;   ESOPHAGOGASTRODUODENOSCOPY (EGD) WITH PROPOFOL  N/A 09/26/2021   Procedure: ESOPHAGOGASTRODUODENOSCOPY (EGD) WITH PROPOFOL;  Surgeon: Jaynie Collins, DO;  Location: [facility];  Service: Gastroenterology;  Laterality: N/A;   INCISION AND DRAINAGE OF WOUND Left 06/07/2019   Procedure: IRRIGATION AND DEBRIDEMENT WOUND;  Surgeon: Recardo Evangelist, DPM;  Location: [facility];  Service: Podiatry;  Laterality: Left;   KNEE ARTHROPLASTY Right 11/13/2021   Procedure: COMPUTER ASSISTED TOTAL KNEE ARTHROPLASTY;  Surgeon: Donato Heinz, MD;  Location: [facility];  Service: Orthopedics;  Laterality: Right;   LEFT HEART CATH AND CORONARY  ANGIOGRAPHY Right 11/24/2022   Procedure: LEFT HEART CATH AND CORONARY ANGIOGRAPHY with possible coronary intervention;  Surgeon: Laurier Nancy, MD;  Location: [facility];  Service: Cardiovascular;  Laterality: Right;   LOWER EXTREMITY ANGIOGRAPHY Left 06/03/2019   Procedure: Lower Extremity Angiography;  Surgeon: Renford Dills, MD;  Location: [facility];  Service: Cardiovascular;  Laterality: Left;   LOWER EXTREMITY ANGIOGRAPHY Right 05/21/2021   Procedure: LOWER EXTREMITY ANGIOGRAPHY;  Surgeon: Renford Dills, MD;  Location: [facility];  Service: Cardiovascular;  Laterality: Right;   PERIPHERAL VASCULAR BALLOON ANGIOPLASTY Left 05/16/2019   Procedure: PERIPHERAL VASCULAR BALLOON ANGIOPLASTY;  Surgeon: Runell Gess, MD;  Location: [facility];  Service: Cardiovascular;  Laterality: Left;  SFA   TUBAL LIGATION  1972   VEIN LIGATION AND STRIPPING     Family History  Problem Relation Age of Onset   Arthritis Mother    Heart attack Father    Cancer Sister    Cancer - Prostate Brother    Colon cancer Neg Hx    Breast cancer Neg Hx    Social History   Socioeconomic History   Marital status: Married    Spouse name: [name]   Number of children: 2   Years of education: Not on file   Highest education level: Not on file  Occupational History   Occupation: retired    Comment: Market researcher  Tobacco Use   Smoking status: Former    Current packs/day: 0.00    Average packs/day: 0.5 packs/day for 15.0 years (7.5 ttl pk-yrs)    Types: Cigarettes    Start date: 05/12/1973    Quit date: 05/12/1988    Years since quitting: 35.1   Smokeless tobacco: Never  Vaping Use   Vaping status: Never Used  Substance and Sexual Activity   Alcohol use: No    Alcohol/week: 0.0 standard drinks of alcohol   Drug use: No   Sexual activity: Yes  Other Topics Concern   Not on file  Social History Narrative   Lives at home with spouse Christen Bame     Social Drivers of Health   Financial Resource Strain: Low Risk  (01/15/2023)   Overall Financial Resource Strain (CARDIA)    Difficulty of Paying Living Expenses: Not hard at all  Food Insecurity: No Food Insecurity (04/07/2023)   Hunger Vital Sign    Worried About Running Out of Food in the Last Year: Never true    Ran Out of Food in the Last Year: Never true  Transportation Needs: No Transportation Needs (04/07/2023)   PRAPARE - Administrator, Civil Service (Medical): No    Lack of Transportation (Non-Medical): No  Physical Activity: Unknown (01/15/2023)   Exercise Vital Sign    Days of Exercise per Week: 7 days    Minutes of Exercise per Session: Not on file  Stress: No Stress Concern Present (01/15/2023)  [employer] of Occupational Health - Occupational Stress Questionnaire    Feeling of Stress : Not at all  Social Connections: Moderately Integrated (01/15/2023)   Social Connection and Isolation Panel [NHANES]    Frequency of Communication with Friends and Family: More than three times a week    Frequency of Social Gatherings with Friends and Family: Twice a week    Attends Religious Services: More than 4 times per year    Active Member of [employer] or Organizations: No    Attends Banker Meetings: Never    Marital Status: Married     Review of Systems  Constitutional:  Negative for appetite change and unexpected weight change.  HENT:  Negative for congestion, sinus pressure and sore throat.   Eyes:  Negative for pain and visual disturbance.  Respiratory:  Negative for cough, chest tightness and shortness of breath.   Cardiovascular:  Negative for chest pain and palpitations.  Gastrointestinal:  Negative for abdominal pain, diarrhea, nausea and vomiting.  Genitourinary:  Negative for difficulty urinating and dysuria.  Musculoskeletal:  Negative for joint swelling and myalgias.  Skin:  Negative for color change and rash.  Neurological:  Negative  for dizziness and headaches.  Hematological:  Negative for adenopathy. Does not bruise/bleed easily.  Psychiatric/Behavioral:  Negative for agitation and dysphoric mood.        Objective:     BP 118/68   Pulse 66   Temp 98 F (36.7 C)   Resp 16   Ht 5\' 3"  (1.6 m)   Wt 200 lb (90.7 kg)   LMP 05/25/1988   SpO2 99%   BMI 35.43 kg/m  Wt Readings from Last 3 Encounters:  06/26/23 200 lb (90.7 kg)  06/02/23 192 lb (87.1 kg)  04/13/23 199 lb 11.8 oz (90.6 kg)    Physical Exam Vitals reviewed.  Constitutional:      General: She is not in acute distress.    Appearance: Normal appearance.  HENT:     Head: Normocephalic and atraumatic.     Right Ear: External ear normal.     Left Ear: External ear normal.     Mouth/Throat:     Pharynx: No oropharyngeal exudate or posterior oropharyngeal erythema.  Eyes:     General: No scleral icterus.       Right eye: No discharge.        Left eye: No discharge.     Conjunctiva/sclera: Conjunctivae normal.  Neck:     Thyroid: No thyromegaly.  Cardiovascular:     Rate and Rhythm: Normal rate and regular rhythm.  Pulmonary:     Effort: No respiratory distress.     Breath sounds: Normal breath sounds. No wheezing.     Comments: Declined breast exam.  Abdominal:     General: Bowel sounds are normal.     Palpations: Abdomen is soft.     Tenderness: There is no abdominal tenderness.  Musculoskeletal:        General: No tenderness.     Cervical back: Neck supple. No tenderness.     Comments: Pedal edema. Leg swelling improved.   Lymphadenopathy:     Cervical: No cervical adenopathy.  Skin:    Findings: No erythema or rash.  Neurological:     Mental Status: She is alert.  Psychiatric:        Mood and Affect: Mood normal.        Behavior: Behavior normal.         Outpatient Encounter Medications  as of 06/26/2023  Medication Sig   acetaminophen (TYLENOL) 650 MG CR tablet Take 650-1,300 mg by mouth every 8 (eight) hours as needed  for pain.   allopurinol (ZYLOPRIM) 100 MG tablet Take 1 tablet (100 mg total) by mouth daily.   amLODipine (NORVASC) 5 MG tablet Take 1 tablet (5 mg total) by mouth daily.   aspirin EC 81 MG tablet Take 1 tablet (81 mg total) by mouth daily. Swallow whole.   atorvastatin (LIPITOR) 80 MG tablet Take 1 tablet (80 mg total) by mouth daily. (Patient taking differently: Take 40 mg by mouth daily.)   clopidogrel (PLAVIX) 75 MG tablet Take 1 tablet (75 mg total) by mouth daily with breakfast.   fexofenadine (ALLEGRA) 180 MG tablet Take 1 tablet (180 mg total) by mouth daily. (Patient taking differently: Take 180 mg by mouth daily as needed for allergies.)   folic acid (FOLVITE) 1 MG tablet Take 1 mg by mouth every evening.    glucose blood (BAYER CONTOUR TEST) test strip USE  STRIP TO CHECK GLUCOSE TWICE DAILY. Dx: E11.9   ipratropium (ATROVENT) 0.06 % nasal spray Place 2 sprays into both nostrils 4 (four) times daily. (Patient taking differently: Place 2 sprays into both nostrils 4 (four) times daily as needed for rhinitis.)   magnesium oxide (MAG-OX) 400 MG tablet Take 1 tablet (400 mg total) by mouth 2 (two) times daily.   metFORMIN (GLUCOPHAGE) 500 MG tablet Take 1 tablet (500 mg total) by mouth daily.   methotrexate (RHEUMATREX) 2.5 MG tablet Take 10 mg by mouth every Wednesday.   metoprolol succinate (TOPROL-XL) 100 MG 24 hr tablet Take 1 tablet (100 mg total) by mouth daily. Take with or immediately following a meal.   MICROLET LANCETS MISC Check sugar once daily, [employer] Microlet Lancets. Dx E11.9   nitroGLYCERIN (NITROSTAT) 0.4 MG SL tablet Place 1 tablet (0.4 mg total) under the tongue every 5 (five) minutes x 3 doses as needed for chest pain.   pantoprazole (PROTONIX) 40 MG tablet Take 1 tablet (40 mg total) by mouth daily.   spironolactone (ALDACTONE) 25 MG tablet Take 1 tablet (25 mg total) by mouth daily.   torsemide (DEMADEX) 20 MG tablet Take 1 tablet (20 mg total) by mouth daily. Taking  1/2 tablet just as needed   traMADol (ULTRAM) 50 MG tablet Take 1 tablet (50 mg total) by mouth every 8 (eight) hours as needed.   No facility-administered encounter medications on file as of 06/26/2023.     Lab Results  Component Value Date   WBC 9.2 06/02/2023   HGB 13.4 06/02/2023   HCT 41.9 06/02/2023   PLT 223.0 06/02/2023   GLUCOSE 114 (H) 06/24/2023   CHOL 93 06/24/2023   TRIG 130.0 06/24/2023   HDL 35.00 (L) 06/24/2023   LDLCALC 32 06/24/2023   ALT 11 06/24/2023   AST 17 06/24/2023   NA 139 06/24/2023   K 4.2 06/24/2023   CL 106 06/24/2023   CREATININE 1.10 06/24/2023   BUN 16 06/24/2023   CO2 26 06/24/2023   TSH 4.01 03/02/2023   INR 1.1 11/21/2022   HGBA1C 6.1 06/24/2023   MICROALBUR 6.3 (H) 06/24/2023    ECHOCARDIOGRAM COMPLETE BUBBLE STUDY Result Date: 04/07/2023    ECHOCARDIOGRAM REPORT   Patient Name:   CLYDELL ALBERTS Date of Exam: 04/07/2023 Medical Rec #:  [id number]    Height:       66.0 in Accession #:    [id number]   Weight:  202.0 lb Date of Birth:  1940/08/13    BSA:          2.008 m Patient Age:    82 years     BP:           105/52 mmHg Patient Gender: F            HR:           61 bpm. Exam Location:  [facility] Procedure: 2D Echo, Cardiac Doppler, Color Doppler and Saline Contrast Bubble            Study Indications:     Syncope  History:         Patient has prior history of Echocardiogram examinations, most                  recent 11/23/2022. Cardiomyopathy and CHF, CAD and Previous                  Myocardial Infarction, Abnormal ECG, Pulmonary HTN,                  Signs/Symptoms:Syncope, Dizziness/Lightheadedness, Murmur and                  Shortness of Breath; Risk Factors:Hypertension, Diabetes and                  Dyslipidemia. CKD.  Sonographer:     Mikki Harbor Referring Phys:  [id number] Eliezer Mccoy PATEL Diagnosing Phys: Yvonne Kendall MD  Sonographer Comments: Image acquisition challenging due to patient body habitus. IMPRESSIONS  1. Left ventricular  ejection fraction, by estimation, is 50 to 55%. The left ventricle has low normal function. The left ventricle has no regional wall motion abnormalities. There is mild left ventricular hypertrophy. Left ventricular diastolic parameters are consistent with Grade I diastolic dysfunction (impaired relaxation). Elevated left atrial pressure.  2. Right ventricular systolic function is mildly reduced. The right ventricular size is mildly enlarged. Severely increased right ventricular wall thickness. Tricuspid regurgitation signal is inadequate for assessing PA pressure.  3. Left atrial size was mild to moderately dilated.  4. The mitral valve is degenerative. Mild to moderate mitral valve regurgitation. No evidence of mitral stenosis.  5. The aortic valve is tricuspid. There is mild thickening of the aortic valve. Aortic valve regurgitation is mild. Aortic valve sclerosis is present, with no evidence of aortic valve stenosis.  6. The inferior vena cava is normal in size with <50% respiratory variability, suggesting right atrial pressure of 8 mmHg.  7. Agitated saline contrast bubble study was negative, with no evidence of any interatrial shunt. FINDINGS  Left Ventricle: Left ventricular ejection fraction, by estimation, is 50 to 55%. The left ventricle has low normal function. The left ventricle has no regional wall motion abnormalities. The left ventricular internal cavity size was normal in size. There is mild left ventricular hypertrophy. Left ventricular diastolic parameters are consistent with Grade I diastolic dysfunction (impaired relaxation). Elevated left atrial pressure. Right Ventricle: The right ventricular size is mildly enlarged. Severely increased right ventricular wall thickness. Right ventricular systolic function is mildly reduced. Tricuspid regurgitation signal is inadequate for assessing PA pressure. Left Atrium: Left atrial size was mild to moderately dilated. Right Atrium: Right atrial size was  normal in size. Pericardium: There is no evidence of pericardial effusion. Mitral Valve: The mitral valve is degenerative in appearance. There is mild thickening of the mitral valve leaflet(s). There is mild calcification of the mitral valve leaflet(s). Mild to moderate  mitral annular calcification. Mild to moderate mitral valve regurgitation. No evidence of mitral valve stenosis. MV peak gradient, 11.6 mmHg. The mean mitral valve gradient is 4.0 mmHg. Tricuspid Valve: The tricuspid valve is not well visualized. Tricuspid valve regurgitation is not demonstrated. Aortic Valve: The aortic valve is tricuspid. There is mild thickening of the aortic valve. Aortic valve regurgitation is mild. Aortic valve sclerosis is present, with no evidence of aortic valve stenosis. Aortic valve mean gradient measures 8.0 mmHg. Aortic valve peak gradient measures 15.7 mmHg. Aortic valve area, by VTI measures 2.03 cm. Pulmonic Valve: The pulmonic valve was not well visualized. Pulmonic valve regurgitation is trivial. No evidence of pulmonic stenosis. Aorta: The aortic root and ascending aorta are structurally normal, with no evidence of dilitation. Pulmonary Artery: The pulmonary artery is not well seen. Venous: The inferior vena cava is normal in size with less than 50% respiratory variability, suggesting right atrial pressure of 8 mmHg. IAS/Shunts: The interatrial septum was not well visualized. Agitated saline contrast was given intravenously to evaluate for intracardiac shunting. Agitated saline contrast bubble study was negative, with no evidence of any interatrial shunt.  LEFT VENTRICLE PLAX 2D LVIDd:         4.00 cm   Diastology LVIDs:         2.90 cm   LV e' medial:    5.22 cm/s LV PW:         1.15 cm   LV E/e' medial:  18.6 LV IVS:        1.18 cm   LV e' lateral:   7.07 cm/s LVOT diam:     2.00 cm   LV E/e' lateral: 13.7 LV SV:         97 LV SV Index:   48 LVOT Area:     3.14 cm  RIGHT VENTRICLE RV Basal diam:  3.65 cm RV Mid  diam:    3.60 cm RV S prime:     12.00 cm/s LEFT ATRIUM              Index        RIGHT ATRIUM           Index LA diam:        4.20 cm  2.09 cm/m   RA Area:     16.20 cm LA Vol (A2C):   111.0 ml 55.27 ml/m  RA Volume:   37.40 ml  18.62 ml/m LA Vol (A4C):   69.0 ml  34.36 ml/m LA Biplane Vol: 87.9 ml  43.77 ml/m  AORTIC VALVE                     PULMONIC VALVE AV Area (Vmax):    2.09 cm      PV Vmax:       0.91 m/s AV Area (Vmean):   2.07 cm      PV Peak grad:  3.3 mmHg AV Area (VTI):     2.03 cm AV Vmax:           198.00 cm/s AV Vmean:          127.000 cm/s AV VTI:            0.479 m AV Peak Grad:      15.7 mmHg AV Mean Grad:      8.0 mmHg LVOT Vmax:         132.00 cm/s LVOT Vmean:        83.600 cm/s LVOT VTI:  0.309 m LVOT/AV VTI ratio: 0.65  AORTA Ao Root diam: 3.50 cm Ao Asc diam:  2.90 cm MITRAL VALVE MV Area (PHT): 2.86 cm     SHUNTS MV Area VTI:   2.42 cm     Systemic VTI:  0.31 m MV Peak grad:  11.6 mmHg    Systemic Diam: 2.00 cm MV Mean grad:  4.0 mmHg MV Vmax:       1.70 m/s MV Vmean:      96.8 cm/s MV Decel Time: 265 msec MV E velocity: 96.90 cm/s MV A velocity: 144.00 cm/s MV E/A ratio:  0.67 Cristal Deer End MD Electronically signed by Yvonne Kendall MD Signature Date/Time: 04/07/2023/6:36:25 PM    Final    NM Pulmonary Perfusion Result Date: 04/07/2023 CLINICAL DATA:  Pulmonary embolism (PE) suspected, high prob EXAM: NUCLEAR MEDICINE PERFUSION LUNG SCAN TECHNIQUE: Perfusion images were obtained in multiple projections after intravenous injection of radiopharmaceutical. Ventilation scans intentionally deferred if perfusion scan and chest x-ray adequate for interpretation during COVID 19 epidemic. RADIOPHARMACEUTICALS:  4.38 mCi Tc-23m MAA IV COMPARISON:  X-ray 04/07/2023 FINDINGS: Heterogeneous distribution of radiotracer within both lungs. Scattered small peripheral subsegmental perfusion defects. No segmental or larger mismatched perfusion defect. IMPRESSION: Low probability for  pulmonary embolism. Electronically Signed   By: Duanne Guess D.O.   On: 04/07/2023 10:38   DG Chest 2 View Result Date: 04/07/2023 CLINICAL DATA:  Shortness of breath. EXAM: CHEST - 2 VIEW COMPARISON:  11/21/2022. FINDINGS: Minimal atelectasis/scarring noted overlying the left lateral costophrenic angle. Bilateral lung fields are otherwise clear. No acute consolidation or lung collapse. Bilateral costophrenic angles are clear. Stable cardio-mediastinal silhouette. No acute osseous abnormalities. The soft tissues are within normal limits. IMPRESSION: *No active cardiopulmonary disease. Electronically Signed   By: Jules Schick M.D.   On: 04/07/2023 10:19   MR KNEE RIGHT WO CONTRAST Result Date: 04/07/2023 CLINICAL DATA:  Possible soft tissue infection, fall. EXAM: MRI OF THE RIGHT KNEE WITHOUT CONTRAST TECHNIQUE: Multiplanar, multisequence MR imaging of the knee was performed. No intravenous contrast was administered. 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 MENISCI Medial meniscus:  N/A Lateral meniscus:  N/A LIGAMENTS Cruciates:  N/A Collaterals:  Intact where visible. CARTILAGE Patellofemoral:  N/A Medial:  N/A Lateral:  N/A Joint:  Small effusion in the suprapatellar bursa. Popliteal Fossa: Very small Baker's cyst. Small amount of infiltrative edema along the popliteal space. Extensor Mechanism:  Unremarkable Bones: Subtle marrow edema the proximal fibula without definite fracture, potentially from mild stress reaction or arthropathy. Other: Focal infiltration of the subcutaneous edema anterolateral to the proximal tibia for example on image 25 series 11, likely representing bruising. No large hematoma in this vicinity. There is mild posterior and and anterolateral subcutaneous edema tracking along the knee in into the calf. No drainable abscess. IMPRESSION: 1. Focal edema of the subcutaneous edema anterolateral to  the proximal tibia, likely representing bruising. No large hematoma in this vicinity. 2. Mild posterior and anterolateral subcutaneous edema tracking along the knee in into the calf. No drainable abscess. 3. Subtle marrow edema in the proximal fibula without definite fracture, potentially from mild stress reaction or arthropathy. 4. Small effusion in the suprapatellar bursa. Very small Baker's cyst. 5. 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 Electronically Signed   By: Gaylyn Rong M.D.   On: 04/07/2023 08:10   MR KNEE LEFT WO CONTRAST Result Date: 04/07/2023 CLINICAL DATA:  Fall. Knee  pain and trauma. Reduced mobility. Swelling in both knees. EXAM: MRI OF THE LEFT KNEE WITHOUT CONTRAST TECHNIQUE: Multiplanar, multisequence MR imaging of the knee was performed. No intravenous contrast was administered. COMPARISON:  Report from radiographs dated 04/02/2023 FINDINGS: MENISCI Medial meniscus: Prominent and mostly degenerative tearing of the medial meniscus with grade 3 signal extending obliquely to the inferior surface in the anterior and posterior horns spanning nearly the entire length of the meniscus, and also abnormal grade 3 extension to the superior surface in the anterior horn on image 55 series 18. Lateral meniscus: Degenerative tearing of the posterior horn and midbody with hazy accentuated signal throughout the diminutive visualized meniscus, and peripheral extrusion. Likely degenerative tearing in the anterior horn especially involving the central/free edge component of the anterior horn. LIGAMENTS Cruciates:  Unremarkable Collaterals: Mild proximal popliteus tendinopathy. Otherwise unremarkable. CARTILAGE Patellofemoral: Generally moderate degenerative chondral thinning although with prominent chondral thinning superiorly along the posterior patellar ridge and adjacent lateral facet, with underlying small subcortical foci of edema. Medial: Moderate to prominent degenerative  chondral thinning with marginal spurring. Lateral: Severe degenerative chondral thinning with marginal spurring. Joint:  Small knee effusion. Popliteal Fossa: Infiltrative edema in the popliteal space. Small Baker's cyst. Extensor Mechanism:  Unremarkable Bones: No significant extra-articular osseous abnormalities identified. Other: 10.1 by 1.9 by 11.4 cm (volume = 110 cm^3) anterolateral subcutaneous hematoma along the knee and proximal shin. IMPRESSION: 1. Large anterolateral subcutaneous hematoma along the knee and proximal shin, with a volume of 110 cc. 2. Prominent and mostly degenerative tearing of the medial meniscus. 3. Degenerative tearing of the lateral meniscus, with peripheral extrusion. 4. 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 5. Small knee effusion. Small Baker's cyst. 6. Mild proximal popliteus tendinopathy. Electronically Signed   By: Gaylyn Rong M.D.   On: 04/07/2023 08:05   US Venous Img Lower Bilateral (DVT) Result Date: 04/07/2023 CLINICAL DATA:  Pain and swelling left lower leg. EXAM: Bilateral LOWER EXTREMITY VENOUS DOPPLER ULTRASOUND TECHNIQUE: Gray-scale sonography with compression, as well as color and duplex ultrasound, were performed to evaluate the deep venous system(s) from the level of the common femoral vein through the popliteal and proximal calf veins. COMPARISON:  Ultrasound 06/16/2018 FINDINGS: VENOUS Normal compressibility of the common femoral, superficial femoral, and popliteal veins, as well as the visualized calf veins. Visualized portions of profunda femoral vein and great saphenous vein unremarkable. No filling defects to suggest DVT on grayscale or color Doppler imaging. Doppler waveforms show normal direction of venous flow, normal respiratory plasticity and response to augmentation. Limited views of the contralateral common femoral vein are unremarkable. OTHER Subcutaneous  edema in both calves. Limitations: none IMPRESSION: Negative for acute DVT. Electronically Signed   By: Minerva Fester M.D.   On: 04/07/2023 01:28       Assessment & Plan:  Health care maintenance Assessment & Plan: Physical today 06/26/23.  Mammogram 02/05/23 - Birads I. Overdue. Colonoscopy - 01/2022 - diverticulosis, non bleeding internal hemorrhoids and multiple polyps recto sigmoid colon.    Type 2 diabetes mellitus with hyperlipidemia [facility]) Assessment & Plan: On metformin currently. Last GFR improved.. Continue Low carb diet and exercise.  Follow met b and a1c.    Thrombocytopenia (HCC) Assessment & Plan: Follow cbc.    Swelling of lower extremity Assessment & Plan: Lower extremity swelling improved. Some pedal edema. Elevate legs.    Rheumatoid arthritis, involving unspecified site, unspecified whether rheumatoid factor present [facility]) Assessment & Plan: On  MTX.  Followed by rheumatology.    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 Otherwise unremarkable study.  Has seen Dr Lonna Cobb.    Pulmonary hypertension, mild (HCC) Assessment & Plan: Continue f/u with pulmonary/cardiology.    Hypomagnesemia Assessment & Plan: Recent magnesium 1.4. increased magoxide to 400mg  tid. Follow.    Primary hypertension Assessment & Plan: Continue metoprolol, losartan and amlodipine.  Spironolactone added during hospitalization. Follow pressures.  Follow metabolic panel.    Hypercholesterolemia Assessment & Plan: Continue lipitor.  Low cholesterol diet and exercise.  Follow lipid panel and liver function tests.     Gastroesophageal reflux disease with esophagitis without hemorrhage Assessment & Plan: No upper symptoms reported.  Continue protonix.    Generalized weakness Assessment & Plan: Continue with PT. Discussed need to exercise regularly. Follow.    CREST variant of  scleroderma (HCC) Assessment & Plan: Followed by [facility]/rheumatology.    Stage 3a chronic kidney disease (HCC) Assessment & Plan: Avoid antiinflammatories.  Stay hydrated.  Follow metabolic panel. Continue losartan. Check metabolic panel today.    Cardiomyopathy, unspecified type [facility]) Assessment & Plan: Echocardiogram 11/2022 - EF 50-55% with grade III DD, right and left atrium moderately dilated with mild MR and mild AR. Continue spironolactone, metoprolol, losartan.    Coronary artery disease involving native coronary artery of native heart without angina pectoris Assessment & Plan: Cardiology (Dr Azucena Cecil). CAD s/p PCI to RV marginal branch 11/2022 (70% OM1, 80% second diagonal).  EF 50 to 55%.  Continue aspirin, Plavix, Lipitor 80.     Atherosclerosis of native artery of right lower extremity with rest pain [facility]) Assessment & Plan: S/p angiogram, angioplasty and toe amputation.  Continue lipitor and plavix.     Aortic atherosclerosis (HCC) Assessment & Plan: Continue lipitor.    Acquired absence of other left toe(s) [facility]) Assessment & Plan: S/p amputation.  Followed by [facility].       Dale Milton, MD

## 2023-06-26 NOTE — Assessment & Plan Note (Signed)
Physical today 06/26/23.  Mammogram 02/05/23 - Birads I. Overdue. Colonoscopy - 01/2022 - diverticulosis, non bleeding internal hemorrhoids and multiple polyps recto sigmoid colon.

## 2023-06-27 ENCOUNTER — Encounter: Payer: Self-pay | Admitting: Internal Medicine

## 2023-06-27 NOTE — Assessment & Plan Note (Signed)
 Cardiology (Dr Azucena Cecil). CAD s/p PCI to RV marginal branch 11/2022 (70% OM1, 80% second diagonal).  EF 50 to 55%.  Continue aspirin, Plavix, Lipitor 80.

## 2023-06-27 NOTE — Assessment & Plan Note (Signed)
 Continue lipitor  ?

## 2023-06-27 NOTE — Assessment & Plan Note (Signed)
Recent magnesium 1.4. increased magoxide to 400mg  tid. Follow.

## 2023-06-27 NOTE — Assessment & Plan Note (Signed)
 No upper symptoms reported.  Continue protonix.

## 2023-06-27 NOTE — Assessment & Plan Note (Signed)
 Continue f/u with pulmonary/cardiology.

## 2023-06-27 NOTE — Assessment & Plan Note (Signed)
 Avoid antiinflammatories.  Stay hydrated.  Follow metabolic panel. Continue losartan. Check metabolic panel today.

## 2023-06-27 NOTE — Assessment & Plan Note (Signed)
 S/p amputation.  Followed by [facility].  ?

## 2023-06-27 NOTE — Assessment & Plan Note (Addendum)
S/p angiogram, angioplasty and toe amputation.  Continue lipitor and plavix.

## 2023-06-27 NOTE — Assessment & Plan Note (Signed)
Continue metoprolol, losartan and amlodipine.  Spironolactone added during hospitalization. Follow pressures.  Follow metabolic panel.

## 2023-06-27 NOTE — Assessment & Plan Note (Signed)
Echocardiogram 11/2022 - EF 50-55% with grade III DD, right and left atrium moderately dilated with mild MR and mild AR. Continue spironolactone, metoprolol, losartan.

## 2023-06-27 NOTE — Assessment & Plan Note (Signed)
On metformin currently. Last GFR improved.. Continue Low carb diet and exercise.  Follow met b and a1c.

## 2023-06-27 NOTE — Assessment & Plan Note (Signed)
 Continue lipitor.  Low cholesterol diet and exercise.  Follow lipid panel and liver function tests.

## 2023-06-27 NOTE — Assessment & Plan Note (Signed)
Lower extremity swelling improved. Some pedal edema. Elevate legs.

## 2023-06-27 NOTE — Assessment & Plan Note (Signed)
 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 Otherwise unremarkable study.  Has seen Dr Lonna Cobb.

## 2023-06-27 NOTE — Assessment & Plan Note (Signed)
 Follow cbc.

## 2023-06-27 NOTE — Assessment & Plan Note (Signed)
 Followed by [facility]/rheumatology.

## 2023-06-27 NOTE — Assessment & Plan Note (Signed)
Continue with PT. Discussed need to exercise regularly. Follow.

## 2023-06-27 NOTE — Assessment & Plan Note (Signed)
 On MTX.  Followed by rheumatology.

## 2023-06-29 ENCOUNTER — Telehealth: Payer: Self-pay | Admitting: Internal Medicine

## 2023-06-29 DIAGNOSIS — N1831 Chronic kidney disease, stage 3a: Secondary | ICD-10-CM

## 2023-06-29 DIAGNOSIS — E119 Type 2 diabetes mellitus without complications: Secondary | ICD-10-CM

## 2023-06-29 NOTE — Telephone Encounter (Signed)
Order placed for f/u labs.  

## 2023-06-29 NOTE — Telephone Encounter (Signed)
 Patient need lab orders.

## 2023-07-06 ENCOUNTER — Other Ambulatory Visit (INDEPENDENT_AMBULATORY_CARE_PROVIDER_SITE_OTHER): Payer: Medicare Other

## 2023-07-06 DIAGNOSIS — N1831 Chronic kidney disease, stage 3a: Secondary | ICD-10-CM

## 2023-07-06 LAB — BASIC METABOLIC PANEL
BUN: 19 mg/dL (ref 6–23)
CO2: 26 meq/L (ref 19–32)
Calcium: 9.8 mg/dL (ref 8.4–10.5)
Chloride: 104 meq/L (ref 96–112)
Creatinine, Ser: 1.09 mg/dL (ref 0.40–1.20)
GFR: 47.44 mL/min — ABNORMAL LOW (ref >60.00)
Glucose, Bld: 101 mg/dL — ABNORMAL HIGH (ref 70–99)
Potassium: 4.3 meq/L (ref 3.5–5.1)
Sodium: 137 meq/L (ref 135–145)

## 2023-07-06 LAB — MAGNESIUM: Magnesium: 1.7 mg/dL (ref 1.5–2.5)

## 2023-07-18 ENCOUNTER — Inpatient Hospital Stay
Admission: EM | Admit: 2023-07-18 | Discharge: 2023-07-29 | DRG: 065 | Disposition: A | Discharge status: Skilled Nursing Facility | Attending: Student | Admitting: Student

## 2023-07-18 ENCOUNTER — Other Ambulatory Visit: Payer: Self-pay

## 2023-07-18 ENCOUNTER — Emergency Department

## 2023-07-18 ENCOUNTER — Inpatient Hospital Stay

## 2023-07-18 DIAGNOSIS — I739 Peripheral vascular disease, unspecified: Secondary | ICD-10-CM | POA: Diagnosis present

## 2023-07-18 DIAGNOSIS — Z8616 Personal history of COVID-19: Secondary | ICD-10-CM | POA: Diagnosis not present

## 2023-07-18 DIAGNOSIS — Z888 Allergy status to other drugs, medicaments and biological substances status: Secondary | ICD-10-CM

## 2023-07-18 DIAGNOSIS — Z7982 Long term (current) use of aspirin: Secondary | ICD-10-CM

## 2023-07-18 DIAGNOSIS — I5032 Chronic diastolic (congestive) heart failure: Secondary | ICD-10-CM | POA: Diagnosis present

## 2023-07-18 DIAGNOSIS — E66811 Obesity, class 1: Secondary | ICD-10-CM | POA: Diagnosis present

## 2023-07-18 DIAGNOSIS — M109 Gout, unspecified: Secondary | ICD-10-CM | POA: Diagnosis present

## 2023-07-18 DIAGNOSIS — Z6835 Body mass index (BMI) 35.0-35.9, adult: Secondary | ICD-10-CM

## 2023-07-18 DIAGNOSIS — Z9981 Dependence on supplemental oxygen: Secondary | ICD-10-CM

## 2023-07-18 DIAGNOSIS — R29706 NIHSS score 6: Secondary | ICD-10-CM | POA: Diagnosis not present

## 2023-07-18 DIAGNOSIS — I700 Atherosclerosis of aorta: Secondary | ICD-10-CM | POA: Diagnosis present

## 2023-07-18 DIAGNOSIS — N1831 Chronic kidney disease, stage 3a: Secondary | ICD-10-CM | POA: Diagnosis present

## 2023-07-18 DIAGNOSIS — K2270 Barrett's esophagus without dysplasia: Secondary | ICD-10-CM | POA: Diagnosis present

## 2023-07-18 DIAGNOSIS — J849 Interstitial pulmonary disease, unspecified: Secondary | ICD-10-CM | POA: Diagnosis present

## 2023-07-18 DIAGNOSIS — M47812 Spondylosis without myelopathy or radiculopathy, cervical region: Secondary | ICD-10-CM | POA: Diagnosis present

## 2023-07-18 DIAGNOSIS — Z955 Presence of coronary angioplasty implant and graft: Secondary | ICD-10-CM

## 2023-07-18 DIAGNOSIS — Z8261 Family history of arthritis: Secondary | ICD-10-CM

## 2023-07-18 DIAGNOSIS — I2510 Atherosclerotic heart disease of native coronary artery without angina pectoris: Secondary | ICD-10-CM | POA: Diagnosis present

## 2023-07-18 DIAGNOSIS — N3000 Acute cystitis without hematuria: Secondary | ICD-10-CM

## 2023-07-18 DIAGNOSIS — J84112 Idiopathic pulmonary fibrosis: Secondary | ICD-10-CM | POA: Diagnosis present

## 2023-07-18 DIAGNOSIS — E871 Hypo-osmolality and hyponatremia: Secondary | ICD-10-CM | POA: Diagnosis present

## 2023-07-18 DIAGNOSIS — I272 Pulmonary hypertension, unspecified: Secondary | ICD-10-CM | POA: Diagnosis present

## 2023-07-18 DIAGNOSIS — M341 CR(E)ST syndrome: Secondary | ICD-10-CM | POA: Diagnosis present

## 2023-07-18 DIAGNOSIS — E1122 Type 2 diabetes mellitus with diabetic chronic kidney disease: Secondary | ICD-10-CM | POA: Diagnosis present

## 2023-07-18 DIAGNOSIS — E7800 Pure hypercholesterolemia, unspecified: Secondary | ICD-10-CM | POA: Diagnosis present

## 2023-07-18 DIAGNOSIS — I639 Cerebral infarction, unspecified: Principal | ICD-10-CM | POA: Diagnosis present

## 2023-07-18 DIAGNOSIS — Z8672 Personal history of thrombophlebitis: Secondary | ICD-10-CM

## 2023-07-18 DIAGNOSIS — Z85828 Personal history of other malignant neoplasm of skin: Secondary | ICD-10-CM

## 2023-07-18 DIAGNOSIS — I2489 Other forms of acute ischemic heart disease: Secondary | ICD-10-CM | POA: Diagnosis present

## 2023-07-18 DIAGNOSIS — R531 Weakness: Secondary | ICD-10-CM | POA: Diagnosis present

## 2023-07-18 DIAGNOSIS — R29898 Other symptoms and signs involving the musculoskeletal system: Secondary | ICD-10-CM | POA: Diagnosis present

## 2023-07-18 DIAGNOSIS — Z7902 Long term (current) use of antithrombotics/antiplatelets: Secondary | ICD-10-CM | POA: Diagnosis not present

## 2023-07-18 DIAGNOSIS — K5900 Constipation, unspecified: Secondary | ICD-10-CM | POA: Diagnosis not present

## 2023-07-18 DIAGNOSIS — D84821 Immunodeficiency due to drugs: Secondary | ICD-10-CM | POA: Diagnosis present

## 2023-07-18 DIAGNOSIS — Z87891 Personal history of nicotine dependence: Secondary | ICD-10-CM

## 2023-07-18 DIAGNOSIS — Z8249 Family history of ischemic heart disease and other diseases of the circulatory system: Secondary | ICD-10-CM

## 2023-07-18 DIAGNOSIS — Z79899 Other long term (current) drug therapy: Secondary | ICD-10-CM

## 2023-07-18 DIAGNOSIS — E1129 Type 2 diabetes mellitus with other diabetic kidney complication: Secondary | ICD-10-CM | POA: Diagnosis present

## 2023-07-18 DIAGNOSIS — M4802 Spinal stenosis, cervical region: Secondary | ICD-10-CM | POA: Diagnosis present

## 2023-07-18 DIAGNOSIS — Z860101 Personal history of adenomatous and serrated colon polyps: Secondary | ICD-10-CM

## 2023-07-18 DIAGNOSIS — G4733 Obstructive sleep apnea (adult) (pediatric): Secondary | ICD-10-CM | POA: Diagnosis present

## 2023-07-18 DIAGNOSIS — Z881 Allergy status to other antibiotic agents status: Secondary | ICD-10-CM

## 2023-07-18 DIAGNOSIS — E669 Obesity, unspecified: Secondary | ICD-10-CM | POA: Diagnosis not present

## 2023-07-18 DIAGNOSIS — I130 Hypertensive heart and chronic kidney disease with heart failure and stage 1 through stage 4 chronic kidney disease, or unspecified chronic kidney disease: Secondary | ICD-10-CM | POA: Diagnosis present

## 2023-07-18 DIAGNOSIS — R29707 NIHSS score 7: Secondary | ICD-10-CM | POA: Diagnosis present

## 2023-07-18 DIAGNOSIS — W19XXXA Unspecified fall, initial encounter: Secondary | ICD-10-CM | POA: Diagnosis present

## 2023-07-18 DIAGNOSIS — I6389 Other cerebral infarction: Secondary | ICD-10-CM | POA: Diagnosis not present

## 2023-07-18 DIAGNOSIS — M4722 Other spondylosis with radiculopathy, cervical region: Secondary | ICD-10-CM | POA: Diagnosis not present

## 2023-07-18 DIAGNOSIS — Z809 Family history of malignant neoplasm, unspecified: Secondary | ICD-10-CM

## 2023-07-18 DIAGNOSIS — Y92009 Unspecified place in unspecified non-institutional (private) residence as the place of occurrence of the external cause: Secondary | ICD-10-CM

## 2023-07-18 DIAGNOSIS — Z89422 Acquired absence of other left toe(s): Secondary | ICD-10-CM

## 2023-07-18 DIAGNOSIS — I959 Hypotension, unspecified: Secondary | ICD-10-CM | POA: Diagnosis present

## 2023-07-18 DIAGNOSIS — I10 Essential (primary) hypertension: Secondary | ICD-10-CM | POA: Diagnosis present

## 2023-07-18 DIAGNOSIS — M069 Rheumatoid arthritis, unspecified: Secondary | ICD-10-CM | POA: Diagnosis present

## 2023-07-18 DIAGNOSIS — Z8042 Family history of malignant neoplasm of prostate: Secondary | ICD-10-CM

## 2023-07-18 DIAGNOSIS — Z79631 Long term (current) use of antimetabolite agent: Secondary | ICD-10-CM

## 2023-07-18 DIAGNOSIS — Z7984 Long term (current) use of oral hypoglycemic drugs: Secondary | ICD-10-CM

## 2023-07-18 DIAGNOSIS — I63421 Cerebral infarction due to embolism of right anterior cerebral artery: Secondary | ICD-10-CM | POA: Diagnosis present

## 2023-07-18 DIAGNOSIS — Z96651 Presence of right artificial knee joint: Secondary | ICD-10-CM | POA: Diagnosis present

## 2023-07-18 DIAGNOSIS — I6340 Cerebral infarction due to embolism of unspecified cerebral artery: Secondary | ICD-10-CM | POA: Diagnosis not present

## 2023-07-18 DIAGNOSIS — I5A Non-ischemic myocardial injury (non-traumatic): Secondary | ICD-10-CM | POA: Diagnosis present

## 2023-07-18 DIAGNOSIS — N390 Urinary tract infection, site not specified: Secondary | ICD-10-CM | POA: Diagnosis present

## 2023-07-18 LAB — CBC WITH DIFFERENTIAL/PLATELET
Abs Immature Granulocytes: 0.06 10*3/uL (ref 0.00–0.07)
Basophils Absolute: 0 10*3/uL (ref 0.0–0.1)
Basophils Relative: 0 %
Eosinophils Absolute: 0 10*3/uL (ref 0.0–0.5)
Eosinophils Relative: 0 %
HCT: 39.4 % (ref 36.0–46.0)
Hemoglobin: 12.8 g/dL (ref 12.0–15.0)
Immature Granulocytes: 1 %
Lymphocytes Relative: 10 %
Lymphs Abs: 0.7 10*3/uL (ref 0.7–4.0)
MCH: 33.9 pg (ref 26.0–34.0)
MCHC: 32.5 g/dL (ref 30.0–36.0)
MCV: 104.2 fL — ABNORMAL HIGH (ref 80.0–100.0)
Monocytes Absolute: 0.2 10*3/uL (ref 0.1–1.0)
Monocytes Relative: 3 %
Neutro Abs: 5.7 10*3/uL (ref 1.7–7.7)
Neutrophils Relative %: 86 %
Platelets: 159 10*3/uL (ref 150–400)
RBC: 3.78 MIL/uL — ABNORMAL LOW (ref 3.87–5.11)
RDW: 16.7 % — ABNORMAL HIGH (ref 11.5–15.5)
WBC: 6.7 10*3/uL (ref 4.0–10.5)
nRBC: 0 % (ref 0.0–0.2)

## 2023-07-18 LAB — COMPREHENSIVE METABOLIC PANEL
ALT: 17 U/L (ref 0–44)
AST: 26 U/L (ref 15–41)
Albumin: 3.4 g/dL — ABNORMAL LOW (ref 3.5–5.0)
Alkaline Phosphatase: 83 U/L (ref 38–126)
Anion gap: 9 (ref 5–15)
BUN: 21 mg/dL (ref 8–23)
CO2: 22 mmol/L (ref 22–32)
Calcium: 10 mg/dL (ref 8.9–10.3)
Chloride: 103 mmol/L (ref 98–111)
Creatinine, Ser: 1.25 mg/dL — ABNORMAL HIGH (ref 0.44–1.00)
GFR, Estimated: 43 mL/min — ABNORMAL LOW (ref >60)
Glucose, Bld: 119 mg/dL — ABNORMAL HIGH (ref 70–99)
Potassium: 4.8 mmol/L (ref 3.5–5.1)
Sodium: 134 mmol/L — ABNORMAL LOW (ref 135–145)
Total Bilirubin: 1.2 mg/dL (ref 0.0–1.2)
Total Protein: 7 g/dL (ref 6.5–8.1)

## 2023-07-18 LAB — URINALYSIS, ROUTINE W REFLEX MICROSCOPIC
Bilirubin Urine: NEGATIVE
Glucose, UA: NEGATIVE mg/dL
Hgb urine dipstick: NEGATIVE
Ketones, ur: NEGATIVE mg/dL
Nitrite: NEGATIVE
Protein, ur: NEGATIVE mg/dL
Specific Gravity, Urine: 1.012 (ref 1.005–1.030)
pH: 6 (ref 5.0–8.0)

## 2023-07-18 LAB — TROPONIN I (HIGH SENSITIVITY)
Troponin I (High Sensitivity): 39 ng/L — ABNORMAL HIGH (ref <18)
Troponin I (High Sensitivity): 38 ng/L — ABNORMAL HIGH (ref <18)

## 2023-07-18 LAB — PROTIME-INR
INR: 1.2 (ref 0.8–1.2)
Prothrombin Time: 14.9 s (ref 11.4–15.2)

## 2023-07-18 LAB — BRAIN NATRIURETIC PEPTIDE: B Natriuretic Peptide: 104.4 pg/mL — ABNORMAL HIGH (ref 0.0–100.0)

## 2023-07-18 LAB — APTT: aPTT: 40 s — ABNORMAL HIGH (ref 24–36)

## 2023-07-18 LAB — CBG MONITORING, ED: Glucose-Capillary: 94 mg/dL (ref 70–99)

## 2023-07-18 MED ORDER — INSULIN ASPART 100 UNIT/ML IJ SOLN
0.0000 [IU] | Freq: Three times a day (TID) | INTRAMUSCULAR | Status: DC
  Administered 2023-07-21 – 2023-07-26 (×4): 1 [IU] via SUBCUTANEOUS
  Filled 2023-07-18 (×4): qty 1

## 2023-07-18 MED ORDER — FOLIC ACID 1 MG PO TABS
1.0000 mg | ORAL_TABLET | Freq: Every evening | ORAL | Status: DC
  Administered 2023-07-18 – 2023-07-28 (×11): 1 mg via ORAL
  Filled 2023-07-18 (×11): qty 1

## 2023-07-18 MED ORDER — ALBUTEROL SULFATE (2.5 MG/3ML) 0.083% IN NEBU: 3.0000 mL | INHALATION_SOLUTION | RESPIRATORY_TRACT | Status: DC | PRN

## 2023-07-18 MED ORDER — ACETAMINOPHEN 650 MG RE SUPP: 650.0000 mg | RECTAL | Status: DC | PRN

## 2023-07-18 MED ORDER — ONDANSETRON HCL 4 MG/2ML IJ SOLN: 4.0000 mg | Freq: Three times a day (TID) | INTRAMUSCULAR | Status: DC | PRN

## 2023-07-18 MED ORDER — LEVOFLOXACIN 250 MG PO TABS
250.0000 mg | ORAL_TABLET | Freq: Every day | ORAL | Status: DC
  Administered 2023-07-18 – 2023-07-20 (×3): 250 mg via ORAL
  Filled 2023-07-18 (×3): qty 1

## 2023-07-18 MED ORDER — METHOCARBAMOL 500 MG PO TABS
500.0000 mg | ORAL_TABLET | Freq: Three times a day (TID) | ORAL | Status: DC | PRN
  Administered 2023-07-23 – 2023-07-29 (×5): 500 mg via ORAL
  Filled 2023-07-18 (×5): qty 1

## 2023-07-18 MED ORDER — HYDRALAZINE HCL 20 MG/ML IJ SOLN: 5.0000 mg | INTRAMUSCULAR | Status: DC | PRN

## 2023-07-18 MED ORDER — NITROGLYCERIN 0.4 MG SL SUBL: 0.4000 mg | SUBLINGUAL_TABLET | SUBLINGUAL | Status: DC | PRN

## 2023-07-18 MED ORDER — ENOXAPARIN SODIUM 40 MG/0.4ML IJ SOSY
40.0000 mg | PREFILLED_SYRINGE | INTRAMUSCULAR | Status: DC
  Administered 2023-07-18 – 2023-07-28 (×11): 40 mg via SUBCUTANEOUS
  Filled 2023-07-18 (×11): qty 0.4

## 2023-07-18 MED ORDER — METHOTREXATE SODIUM 2.5 MG PO TABS
10.0000 mg | ORAL_TABLET | ORAL | Status: DC
Start: 2023-07-22 — End: 2023-07-29
  Administered 2023-07-22: 10 mg via ORAL
  Filled 2023-07-18 (×3): qty 4

## 2023-07-18 MED ORDER — ASPIRIN 81 MG PO TBEC
81.0000 mg | DELAYED_RELEASE_TABLET | Freq: Every day | ORAL | Status: DC
  Administered 2023-07-19 – 2023-07-29 (×11): 81 mg via ORAL
  Filled 2023-07-18 (×11): qty 1

## 2023-07-18 MED ORDER — ATORVASTATIN CALCIUM 20 MG PO TABS
40.0000 mg | ORAL_TABLET | Freq: Every day | ORAL | Status: DC
  Administered 2023-07-19 – 2023-07-29 (×11): 40 mg via ORAL
  Filled 2023-07-18 (×11): qty 2

## 2023-07-18 MED ORDER — DM-GUAIFENESIN ER 30-600 MG PO TB12: 1.0000 | ORAL_TABLET | Freq: Two times a day (BID) | ORAL | Status: DC | PRN

## 2023-07-18 MED ORDER — MAGNESIUM OXIDE 400 MG PO TABS
400.0000 mg | ORAL_TABLET | Freq: Two times a day (BID) | ORAL | Status: DC
  Administered 2023-07-19 (×2): 400 mg via ORAL
  Filled 2023-07-18 (×3): qty 1

## 2023-07-18 MED ORDER — INSULIN ASPART 100 UNIT/ML IJ SOLN: 0.0000 [IU] | Freq: Every day | INTRAMUSCULAR | Status: DC

## 2023-07-18 MED ORDER — LORATADINE 10 MG PO TABS: 10.0000 mg | ORAL_TABLET | Freq: Every day | ORAL | Status: DC | PRN

## 2023-07-18 MED ORDER — SODIUM CHLORIDE 0.9 % IV BOLUS
500.0000 mL | Freq: Once | INTRAVENOUS | Status: AC
  Administered 2023-07-18: 500 mL via INTRAVENOUS

## 2023-07-18 MED ORDER — OXYCODONE-ACETAMINOPHEN 5-325 MG PO TABS
1.0000 | ORAL_TABLET | ORAL | Status: DC | PRN
  Filled 2023-07-18: qty 1

## 2023-07-18 MED ORDER — ACETAMINOPHEN 325 MG PO TABS
650.0000 mg | ORAL_TABLET | ORAL | Status: DC | PRN
  Administered 2023-07-23 – 2023-07-25 (×5): 650 mg via ORAL
  Filled 2023-07-18 (×6): qty 2

## 2023-07-18 MED ORDER — STROKE: EARLY STAGES OF RECOVERY BOOK
Freq: Once | Status: AC
  Administered 2023-07-19: 1

## 2023-07-18 MED ORDER — CLOPIDOGREL BISULFATE 75 MG PO TABS
75.0000 mg | ORAL_TABLET | Freq: Every day | ORAL | Status: DC
  Administered 2023-07-19 – 2023-07-29 (×11): 75 mg via ORAL
  Filled 2023-07-18 (×11): qty 1

## 2023-07-18 MED ORDER — SENNOSIDES-DOCUSATE SODIUM 8.6-50 MG PO TABS: 1.0000 | ORAL_TABLET | Freq: Every evening | ORAL | Status: DC | PRN

## 2023-07-18 MED ORDER — ACETAMINOPHEN 160 MG/5ML PO SOLN: 650.0000 mg | ORAL | Status: DC | PRN

## 2023-07-18 MED ORDER — ALLOPURINOL 100 MG PO TABS
100.0000 mg | ORAL_TABLET | Freq: Every day | ORAL | Status: DC
  Administered 2023-07-19 – 2023-07-29 (×11): 100 mg via ORAL
  Filled 2023-07-18 (×12): qty 1

## 2023-07-18 NOTE — ED Notes (Signed)
 First nurse note From home [facility] for mechanical fall yesterday while getting out of shower. No head trauma or LOC, takes Plavix. Took 50mg  Tramadol at 11am and 2 tab tylenol this AM. Has been declining since December. VS 142/73, HR 84, 90% RA, 98.7, 127 CBG. Was placed on 4L then was 95%, EMS removed oxygen per request upon arrival and no SOB noted.

## 2023-07-18 NOTE — ED Notes (Signed)
 Patient returned to room from MRI

## 2023-07-18 NOTE — ED Notes (Signed)
 Patient transported to MRI

## 2023-07-18 NOTE — ED Triage Notes (Addendum)
 Pt slipped and fell in shower yesterday morning and pt c/o sacral pain today. Pt denies LOC, is on plavix. Pt denies other pain or injury, denies head injury. Pt is AOX4, NAD noted. Pt has been unable to ambulate since due to bilateral leg weakness.

## 2023-07-18 NOTE — ED Notes (Signed)
 Hospitalist at bedside

## 2023-07-18 NOTE — ED Provider Notes (Signed)
 [facility] Emergency Department Provider Note     Event Date/Time   First MD Initiated Contact with Patient 07/18/23 1504     (approximate)   History   Fall   HPI  CASMIRA CRAMER is a 83 y.o. female with a history of CHF, HTN, diabetes, osteoarthritis and pulmonary hypertension who presents to the ED for evaluation following a witnessed mechanical fall yesterday in the shower.  She was assisted by her husband to get up but complained of lower back and sacral pain.  She reports she has not been mobile since this fall and has noticed an increase in weakness. She denies saddle anesthesia and loss of bladder and bowel control.  She denies hitting her head or LOC.  She is on Plavix for a cardiac stent placed in December 2024. No chest pain or shortness of breath.  Chart reviewed      Physical Exam   Triage Vital Signs: ED Triage Vitals  Encounter Vitals Group     BP 07/18/23 1242 (!) 85/61     Systolic BP Percentile --      Diastolic BP Percentile --      Pulse Rate 07/18/23 1242 94     Resp 07/18/23 1242 17     Temp 07/18/23 1242 98 F (36.7 C)     Temp Source 07/18/23 1242 Oral     SpO2 07/18/23 1242 95 %     Weight --      Height --      Head Circumference --      Peak Flow --      Pain Score 07/18/23 1240 8     Pain Loc --      Pain Education --      Exclude from Growth Chart --     Most recent vital signs: Vitals:   07/18/23 1629 07/18/23 1833  BP: (!) 151/71   Pulse: 93   Resp: 20   Temp: 98.6 F (37 C) 98.5 F (36.9 C)  SpO2: 99%     General: Alert and oriented. INAD.  Sitting Comfortable in evaluation room.  Head:  NCAT.  Neck:   No cervical spine tenderness to palpation. Full ROM without difficulty.  CV:  RRR. No peripheral edema.  RESP:  Normal effort. LCTAB. No retractions.  BACK:  Spinous process is midline without deformity.  Tenderness along sacrum with palpation. MSK:   Full PROM in lateral hip joints.  Tenderness to  right hip on palpation.  NEURO:  Sensation and motor function intact.  Patient is not able to hold her left arm up as high or as long as the right arm. 5/5 muscle strength of LE.   ED Results / Procedures / Treatments   Labs (all labs ordered are listed, but only abnormal results are displayed) Labs Reviewed  CBC WITH DIFFERENTIAL/PLATELET - Abnormal; Notable for the following components:      Result Value   RBC 3.78 (*)    MCV 104.2 (*)    RDW 16.7 (*)    All other components within normal limits  COMPREHENSIVE METABOLIC PANEL - Abnormal; Notable for the following components:   Sodium 134 (*)    Glucose, Bld 119 (*)    Creatinine, Ser 1.25 (*)    Albumin 3.4 (*)    GFR, Estimated 43 (*)    All other components within normal limits  URINALYSIS, ROUTINE W REFLEX MICROSCOPIC - Abnormal; Notable for the following components:   Color,  Urine YELLOW (*)    APPearance HAZY (*)    Leukocytes,Ua MODERATE (*)    Bacteria, UA MANY (*)    All other components within normal limits  TROPONIN I (HIGH SENSITIVITY) - Abnormal; Notable for the following components:   Troponin I (High Sensitivity) 39 (*)    All other components within normal limits  URINE CULTURE  BRAIN NATRIURETIC PEPTIDE  APTT  PROTIME-INR  TROPONIN I (HIGH SENSITIVITY)   RADIOLOGY  I personally viewed and evaluated these images as part of my medical decision making, as well as reviewing the written report by the radiologist.  ED Provider Interpretation: No acute bony abnormality  CT Hip Right Wo Contrast Result Date: 07/18/2023 CLINICAL DATA:  Fracture suspected. EXAM: CT OF THE RIGHT HIP WITHOUT CONTRAST TECHNIQUE: Multidetector CT imaging of the right hip was performed according to the standard protocol. Multiplanar CT image reconstructions were also generated. RADIATION DOSE REDUCTION: This exam was performed according to the departmental dose-optimization program which includes automated exposure control, adjustment of  the mA and/or kV according to patient size and/or use of iterative reconstruction technique. COMPARISON:  Right hip x-ray same day FINDINGS: Bones/Joint/Cartilage The bones are diffusely osteopenic. No acute fracture or dislocation identified. No focal osseous lesion. No significant joint effusion. Ligaments Suboptimally assessed by CT. Muscles and Tendons No intramuscular hematoma or fluid collection. Soft tissues There is mild soft tissue thickening lateral to the right hip. No fluid collection, foreign body or gas identified. IMPRESSION: 1. No acute fracture or dislocation identified. 2. Mild soft tissue thickening lateral to the right hip. Electronically Signed   By: Darliss Cheney M.D.   On: 07/18/2023 18:02   CT Lumbar Spine Wo Contrast Result Date: 07/18/2023 CLINICAL DATA:  Pain after fall. EXAM: CT LUMBAR SPINE WITHOUT CONTRAST TECHNIQUE: Multidetector CT imaging of the lumbar spine was performed without intravenous contrast administration. Multiplanar CT image reconstructions were also generated. RADIATION DOSE REDUCTION: This exam was performed according to the departmental dose-optimization program which includes automated exposure control, adjustment of the mA and/or kV according to patient size and/or use of iterative reconstruction technique. COMPARISON:  None Available. FINDINGS: Segmentation: 5 lumbar type vertebrae. Alignment: Broad-based dextroscoliotic curvature. Trace retrolisthesis of L2 on L3 and L5 on S1. Vertebrae: No acute fracture. Vertebral body heights are normal. The posterior elements are intact. Included sacrum is intact. Paraspinal and other soft tissues: Aortic atherosclerosis. Colonic diverticulosis no paravertebral hematoma. Disc levels: L1-L2: No spinal canal or neural foraminal stenosis. L2-L3: Ligamentum flavum hypertrophy and broad-based disc bulge causes mild left neural foraminal stenosis. L3-L4: Facet and ligamentum flavum hypertrophy with broad-based disc bulge causes  left neural foraminal stenosis. L4-L5: Facet ligamentum flavum hypertrophy is well as broad-based disc bulge causes spinal canal and severe right neural foraminal stenosis. Moderate left neural foraminal narrowing. L5-S1: Facet and ligamentum flavum hypertrophy and broad-based disc bulge causes mild spinal canal and right neural foraminal stenosis. IMPRESSION: 1. No fracture of the lumbar spine. 2. Multilevel degenerative change with spinal canal and neural foraminal stenosis as described. Mild scoliosis. Electronically Signed   By: Narda Rutherford M.D.   On: 07/18/2023 16:38   CT Head Wo Contrast Result Date: 07/18/2023 CLINICAL DATA:  Head trauma, minor (Age >= 65y) fall; fall EXAM: CT HEAD WITHOUT CONTRAST CT CERVICAL SPINE WITHOUT CONTRAST TECHNIQUE: Multidetector CT imaging of the head and cervical spine was performed following the standard protocol without intravenous contrast. Multiplanar CT image reconstructions of the cervical spine were also generated. RADIATION DOSE  REDUCTION: This exam was performed according to the departmental dose-optimization program which includes automated exposure control, adjustment of the mA and/or kV according to patient size and/or use of iterative reconstruction technique. COMPARISON:  None Available. FINDINGS: CT HEAD FINDINGS Brain: Cerebral ventricle sizes are concordant with the degree of cerebral volume loss. Patchy and confluent areas of decreased attenuation are noted throughout the deep and periventricular white matter of the cerebral hemispheres bilaterally, compatible with chronic microvascular ischemic disease. No evidence of large-territorial acute infarction. No parenchymal hemorrhage. No mass lesion. No extra-axial collection. No mass effect or midline shift. No hydrocephalus. Basilar cisterns are patent. Vascular: No hyperdense vessel. Atherosclerotic calcifications are present within the cavernous internal carotid and vertebral arteries. Skull: No acute  fracture or focal lesion. Sinuses/Orbits: Paranasal sinuses and mastoid air cells are clear. Bilateral lens replacement. Otherwise the orbits are unremarkable. Other: None. CT CERVICAL SPINE FINDINGS Alignment: Grade 1 anterolisthesis of C7 on T1. Skull base and vertebrae: Multilevel moderate degenerative changes of the spine most prominent at the C3-C4 level. Multilevel posterior disc osteophyte complex formation. At least moderate osseous foraminal stenosis at the left C4-C5 and C5-C6 level. No severe osseous central canal stenosis. No acute fracture. No aggressive appearing focal osseous lesion or focal pathologic process. Soft tissues and spinal canal: No prevertebral fluid or swelling. No visible canal hematoma. Upper chest: Unremarkable. Other: None. IMPRESSION: 1. No acute intracranial abnormality. 2. No acute displaced fracture or traumatic listhesis of the cervical spine. 3. Degenerative changes with at least moderate osseous foraminal stenosis at the left C4-C5 and C5-C6 level. Electronically Signed   By: Tish Frederickson M.D.   On: 07/18/2023 16:35   CT Cervical Spine Wo Contrast Result Date: 07/18/2023 CLINICAL DATA:  Head trauma, minor (Age >= 65y) fall; fall EXAM: CT HEAD WITHOUT CONTRAST CT CERVICAL SPINE WITHOUT CONTRAST TECHNIQUE: Multidetector CT imaging of the head and cervical spine was performed following the standard protocol without intravenous contrast. Multiplanar CT image reconstructions of the cervical spine were also generated. RADIATION DOSE REDUCTION: This exam was performed according to the departmental dose-optimization program which includes automated exposure control, adjustment of the mA and/or kV according to patient size and/or use of iterative reconstruction technique. COMPARISON:  None Available. FINDINGS: CT HEAD FINDINGS Brain: Cerebral ventricle sizes are concordant with the degree of cerebral volume loss. Patchy and confluent areas of decreased attenuation are noted  throughout the deep and periventricular white matter of the cerebral hemispheres bilaterally, compatible with chronic microvascular ischemic disease. No evidence of large-territorial acute infarction. No parenchymal hemorrhage. No mass lesion. No extra-axial collection. No mass effect or midline shift. No hydrocephalus. Basilar cisterns are patent. Vascular: No hyperdense vessel. Atherosclerotic calcifications are present within the cavernous internal carotid and vertebral arteries. Skull: No acute fracture or focal lesion. Sinuses/Orbits: Paranasal sinuses and mastoid air cells are clear. Bilateral lens replacement. Otherwise the orbits are unremarkable. Other: None. CT CERVICAL SPINE FINDINGS Alignment: Grade 1 anterolisthesis of C7 on T1. Skull base and vertebrae: Multilevel moderate degenerative changes of the spine most prominent at the C3-C4 level. Multilevel posterior disc osteophyte complex formation. At least moderate osseous foraminal stenosis at the left C4-C5 and C5-C6 level. No severe osseous central canal stenosis. No acute fracture. No aggressive appearing focal osseous lesion or focal pathologic process. Soft tissues and spinal canal: No prevertebral fluid or swelling. No visible canal hematoma. Upper chest: Unremarkable. Other: None. IMPRESSION: 1. No acute intracranial abnormality. 2. No acute displaced fracture or traumatic listhesis of the  cervical spine. 3. Degenerative changes with at least moderate osseous foraminal stenosis at the left C4-C5 and C5-C6 level. Electronically Signed   By: Tish Frederickson M.D.   On: 07/18/2023 16:35   DG HIPS BILAT WITH PELVIS MIN 5 VIEWS Result Date: 07/18/2023 CLINICAL DATA:  Fall in the shower yesterday morning, sacral pain today. Leg weakness. EXAM: DG HIP (WITH OR WITHOUT PELVIS) 5+V BILAT; SACRUM AND COCCYX - 2+ VIEW COMPARISON:  None Available. FINDINGS: Pelvis and hips: Potential nondisplaced right femoral neck fracture. No additional fracture of the  pelvis or hips. The bones are subjectively under mineralized. Mild bilateral hip osteoarthritis with joint space narrowing and spurring. Pubic rami are intact. Pubic symphysis is congruent. Sacrum: Cortical margins of the sacrum and coccyx are intact. No displaced sacral fracture. No disruption of sacral ala. Sacroiliac joints are congruent. There are vascular calcifications and phleboliths in the pelvis. IMPRESSION: 1. Potential nondisplaced right femoral neck fracture. Recommend further assessment with CT or MRI. 2. No additional fracture of the pelvis or hips. 3. No sacral/coccygeal fracture. Electronically Signed   By: Narda Rutherford M.D.   On: 07/18/2023 16:27   DG Sacrum/Coccyx Result Date: 07/18/2023 CLINICAL DATA:  Fall in the shower yesterday morning, sacral pain today. Leg weakness. EXAM: DG HIP (WITH OR WITHOUT PELVIS) 5+V BILAT; SACRUM AND COCCYX - 2+ VIEW COMPARISON:  None Available. FINDINGS: Pelvis and hips: Potential nondisplaced right femoral neck fracture. No additional fracture of the pelvis or hips. The bones are subjectively under mineralized. Mild bilateral hip osteoarthritis with joint space narrowing and spurring. Pubic rami are intact. Pubic symphysis is congruent. Sacrum: Cortical margins of the sacrum and coccyx are intact. No displaced sacral fracture. No disruption of sacral ala. Sacroiliac joints are congruent. There are vascular calcifications and phleboliths in the pelvis. IMPRESSION: 1. Potential nondisplaced right femoral neck fracture. Recommend further assessment with CT or MRI. 2. No additional fracture of the pelvis or hips. 3. No sacral/coccygeal fracture. Electronically Signed   By: Narda Rutherford M.D.   On: 07/18/2023 16:27    PROCEDURES:  Critical Care performed: No  Procedures   MEDICATIONS ORDERED IN ED: Medications  albuterol (PROVENTIL) (2.5 MG/3ML) 0.083% nebulizer solution 3 mL (has no administration in time range)  dextromethorphan-guaiFENesin  (MUCINEX DM) 30-600 MG per 12 hr tablet 1 tablet (has no administration in time range)  ondansetron (ZOFRAN) injection 4 mg (has no administration in time range)  insulin aspart (novoLOG) injection 0-9 Units (has no administration in time range)  insulin aspart (novoLOG) injection 0-5 Units (has no administration in time range)   stroke: early stages of recovery book (has no administration in time range)  acetaminophen (TYLENOL) tablet 650 mg (has no administration in time range)    Or  acetaminophen (TYLENOL) 160 MG/5ML solution 650 mg (has no administration in time range)    Or  acetaminophen (TYLENOL) suppository 650 mg (has no administration in time range)  senna-docusate (Senokot-S) tablet 1 tablet (has no administration in time range)  enoxaparin (LOVENOX) injection 40 mg (has no administration in time range)  hydrALAZINE (APRESOLINE) injection 5 mg (has no administration in time range)  allopurinol (ZYLOPRIM) tablet 100 mg (has no administration in time range)  aspirin EC tablet 81 mg (has no administration in time range)  methotrexate (RHEUMATREX) tablet 10 mg (has no administration in time range)  atorvastatin (LIPITOR) tablet 40 mg (has no administration in time range)  nitroGLYCERIN (NITROSTAT) SL tablet 0.4 mg (has no administration in time range)  magnesium oxide (MAG-OX) tablet 400 mg (has no administration in time range)  clopidogrel (PLAVIX) tablet 75 mg (has no administration in time range)  folic acid (FOLVITE) tablet 1 mg (has no administration in time range)  loratadine (CLARITIN) tablet 10 mg (has no administration in time range)  levofloxacin (LEVAQUIN) tablet 250 mg (has no administration in time range)  oxyCODONE-acetaminophen (PERCOCET/ROXICET) 5-325 MG per tablet 1 tablet (has no administration in time range)  methocarbamol (ROBAXIN) tablet 500 mg (has no administration in time range)  sodium chloride 0.9 % bolus 500 mL (0 mLs Intravenous Stopped 07/18/23 1707)      IMPRESSION / MDM / ASSESSMENT AND PLAN / ED COURSE  I reviewed the triage vital signs and the nursing notes.                               83 y.o. female presents to the emergency department for evaluation and treatment of generalized weakness. See HPI for further details.   Differential diagnosis includes, but is not limited to electrolyte abnormality, dehydration, fracture, ICH, CVA  Patient's presentation is most consistent with acute presentation with potential threat to life or bodily function.  We will obtain head and cervical CT given the patient is on Plavix, obtain imaging of bilateral hips given patient has decreased mobility per family and she is having right hip pain.  Imaging of her sacrum given tenderness on palpation and CT of lumbar spine.  Given her initial blood pressure of 86/61 will administer fluids and reassess.  Imaging is reassuring.  No acute bony abnormalities.  Discussed case with supervising physician Dr. Derrill Kay who recommended further workup given decreased weakness with upper extremity.  Will obtain troponin, urinalysis and an MRI for suspicions of CVA.  ----------------------------------------- 7:30 PM on 07/18/2023 ----------------------------------------- Troponin is elevated at 39.  Urinalysis reveals leukocytes and bacteria indicating urinary tract infection.  Pending MRI.  I do believe patient is appropriate for admission.  Will consult hospitalist.  Hospitalist has agreed to admit patient.  Transferring care to Dr. Clyde Lundborg   FINAL CLINICAL IMPRESSION(S) / ED DIAGNOSES   Final diagnoses:  Fall, initial encounter  Left arm weakness     Rx / DC Orders   ED Discharge Orders     None        Note:  This document was prepared using Dragon voice recognition software and may include unintentional dictation errors.    Romeo Apple, Maleyah Evans A, PA-C 07/18/23 2108    Phineas Semen, MD 07/19/23 1537

## 2023-07-18 NOTE — ED Notes (Addendum)
 Note entered at wrong time stamp per this RN.

## 2023-07-18 NOTE — H&P (Addendum)
 History and Physical    CHANNEL PAPANDREA BJY:[id number] DOB: 11/11/1940 DOA: 07/18/2023  Referring MD/NP/PA:   PCP: Dale [place], MD   Patient coming from:  The patient is coming from home.     Chief Complaint: fall and weakness, urinary frequency  HPI: Nina Perez is a 83 y.o. female with medical history significant of HTN, HLD, DM, PVD, CAD with stent, dCHF, gout, RA, scleroderma, mild pulmonary fibrosis, pulmonary hypertension, left bundle blockage, CKD 3A, who presents with fall and weakness, urinary frequency  Patient states that she has generalized weakness in the past several days, particularly in both legs.  She has difficulty walking because of leg weakness.  This morning she developed left arm weakness, with difficulty holding cup, no unilateral numbness or tingling in extremities.  No facial droop or slurred speech.  No vision loss or hearing loss.  This morning at about 9:00, she fell in the bathroom, no loss of consciousness.  She complains of pain in sacral area, lower back and right hip pain.  Patient does not have chest pain, cough, SOB.  No nausea, vomiting, diarrhea or abdominal pain.  No fever or chills.  She has increased urinary frequency, no dysuria or burning with urination.  No hematuria.  Data reviewed independently and ED Course: pt was found to have WBC 6.7, troponin 39, positive UA (hazy appearance, moderate amount leukocyte, many bacteria, WBC 6-10), temperature normal, initial blood pressure 85/61, which improved to 151/71 after giving 500 cc normal saline in ED, heart rate 93, RR 20, oxygen saturation 99% on room air.  Pt is admitted to tele bed as inpt.  CT of head negative for acute intracranial abnormalities.   CT of C-spine negative for acute injury, but showed degenerative disc disease.   CT of lumbar spine negative for fracture, did showed degenerative change. X-ray of sacrum negative for sacrococcyx fracture. X-ray of bilateral hip/pelvis showed possible  right femoral neck fracture, but CT scan of right hip is negative for fracture or dislocation.   EKG: Not done in ED, will get one.   Review of Systems:   General: no fevers, chills, no body weight gain, has fatigue HEENT: no blurry vision, hearing changes or sore throat Respiratory: no dyspnea, coughing, wheezing CV: no chest pain, no palpitations GI: no nausea, vomiting, abdominal pain, diarrhea, constipation GU: no dysuria, burning on urination, has increased urinary frequency, no hematuria  Ext: has mild leg edema Neuro: no vision change or hearing loss. Has fall and left arm weakness  skin: no rash, no skin tear. MSK: No muscle spasm, no deformity, no limitation of range of movement in spin Heme: No easy bruising.  Travel history: No recent long distant travel.   Allergy:  Allergies  Allergen Reactions   Ethanol Other (See Comments)    REDNESS AND BLISTERS   Tape Other (See Comments)    Ob band aid adhesive   Pull skin off / Rash  Ob band aid adhesive  Pull skin off / Rash   Triamterene-Hctz Other (See Comments)    Weakness/fatigue   Cephalexin Rash    Blotchiness to B lower legs after several days of cephalexin for UTI   Hydrochlorothiazide W-Triamterene Other (See Comments)    Other reaction(s): Other (See Comments)  Extreme fatigue/weakness  Extreme fatigue/weakness    Other reaction(s): Other (See Comments) Extreme fatigue/weakness    Past Medical History:  Diagnosis Date   Adenomatous colon polyp    Aortic atherosclerosis (HCC)    Barrett's  esophagus    Cardiac murmur    CKD (chronic kidney disease), stage III (HCC)    Dyspnea on exertion    GERD (gastroesophageal reflux disease)    Gout    Hiatal hernia    History of 2019 novel coronavirus disease (COVID-19) 08/2020   Hypercholesterolemia    Hypertension    ILD (interstitial lung disease) (HCC)    a.) 2/2 scleroderma Dx   LBBB (left bundle branch block)    Long term current use of  antithrombotics/antiplatelets    a.) ASA + clopidogrel   Long term current use of immunosuppressive drug    a.) MTX for RA and scleroderma Dx   OSA on CPAP    Osteoarthritis    a.) knees, spine   PAD (peripheral artery disease) (HCC)    Phlebitis    a.) x 2 (with pregnancy)   Pulmonary fibrosis (HCC)    a.) mild   Pulmonary hypertension (HCC) 02/24/2012   a.) TTE 02/24/2012: EF 55%, RVSP 33; b.) TTE 10/12/2014: EF >55%, RVSP 36.1; c.) TTE 09/02/2017: EF 55%, RVSP 45.5   Reflux esophagitis    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 b.) renal US 05/23/2020: solid LEFT renal mass measuring 1.4 x 1.5 x 1.6. 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, sclerodactyly, telangiectasias   Skin cancer 2018   Supplemental oxygen dependent    a.) 2L/[place] PRN for DOE related to mild pulmonary fibrosis and pulmonary HTN   T2DM (type 2 diabetes mellitus) (HCC)     Past Surgical History:  Procedure Laterality Date   ABDOMINAL AORTOGRAM W/LOWER EXTREMITY Left 05/16/2019   Procedure: ABDOMINAL AORTOGRAM W/LOWER EXTREMITY;  Surgeon: Runell Gess, MD;  Location: [facility];  Service: Cardiovascular;  Laterality: Left;   AMPUTATION TOE Left 06/02/2019   Procedure: AMPUTATION TOE;  Surgeon: Recardo Evangelist, DPM;  Location: [facility] ORS;  Service: Podiatry;  Laterality: Left;   APPENDECTOMY  1972   BREAST CYST ASPIRATION Left    neg   COLONOSCOPY WITH PROPOFOL N/A 09/26/2021   Procedure: COLONOSCOPY WITH PROPOFOL;  Surgeon: Jaynie Collins, DO;  Location: [facility] ENDOSCOPY;  Service: Gastroenterology;  Laterality: N/A;  DM, WHEELCHAIR, PLAVIX   CORONARY STENT INTERVENTION N/A 11/24/2022   Procedure:  CORONARY STENT INTERVENTION;  Surgeon: Iran Ouch, MD;  Location: [facility] INV[facility] CV [facility];  Service: Cardiovascular;  Laterality: N/A;   ESOPHAGOGASTRODUODENOSCOPY (EGD) WITH PROPOFOL N/A 09/26/2021   Procedure: ESOPHAGOGASTRODUODENOSCOPY (EGD) WITH PROPOFOL;  Surgeon: Jaynie Collins, DO;  Location: [facility] ENDOSCOPY;  Service: Gastroenterology;  Laterality: N/A;   INCISION AND DRAINAGE OF WOUND Left 06/07/2019   Procedure: IRRIGATION AND DEBRIDEMENT WOUND;  Surgeon: Recardo Evangelist, DPM;  Location: [facility] ORS;  Service: Podiatry;  Laterality: Left;   KNEE ARTHROPLASTY Right 11/13/2021   Procedure: COMPUTER ASSISTED TOTAL KNEE ARTHROPLASTY;  Surgeon: Donato Heinz, MD;  Location: [facility] ORS;  Service: Orthopedics;  Laterality: Right;   LEFT HEART CATH AND CORONARY ANGIOGRAPHY Right 11/24/2022   Procedure: LEFT HEART CATH AND CORONARY ANGIOGRAPHY with possible coronary intervention;  Surgeon: Laurier Nancy, MD;  Location: [facility] IN[facility]IVE CV [facility];  Service: Cardiovascular;  Laterality: Right;   LOWER EXTREMITY  ANGIOGRAPHY Left 06/03/2019   Procedure: Lower Extremity Angiography;  Surgeon: Renford Dills, MD;  Location: [facility] CV LAB;  Service: Cardiovascular;  Laterality: Left;   LOWER EXTREMITY ANGIOGRAPHY Right 05/21/2021   Procedure: LOWER EXTREMITY ANGIOGRAPHY;  Surgeon: Renford Dills, MD;  Location: [facility] INV[facility];  Service: Cardiovascular;  Laterality: Right;   PERIPHERAL VASCULAR BALLOON ANGIOPLASTY Left 05/16/2019   Procedure: PERIPHERAL VASCULAR BALLOON ANGIOPLASTY;  Surgeon: Runell Gess, MD;  Location: [facility];  Service: Cardiovascular;  Laterality: Left;  SFA   TUBAL LIGATION  1972   VEIN LIGATION AND STRIPPING      Social History:  reports that she quit smoking about 35 years ago. Her smoking use included cigarettes. She started smoking about 50 years ago. She has a 7.5 pack-year smoking history. She has never used smokeless tobacco. She reports  that she does not drink alcohol and does not use drugs.  Family History:  Family History  Problem Relation Age of Onset   Arthritis Mother    Heart attack Father    Cancer Sister    Cancer - Prostate Brother    Colon cancer Neg Hx    Breast cancer Neg Hx      Prior to Admission medications   Medication Sig Start Date End Date Taking? Authorizing Provider  acetaminophen (TYLENOL) 650 MG CR tablet Take 650-1,300 mg by mouth every 8 (eight) hours as needed for pain.    [provider]  allopurinol (ZYLOPRIM) 100 MG tablet Take 1 tablet (100 mg total) by mouth daily. 06/02/23   Dale [place], MD  amLODipine (NORVASC) 5 MG tablet Take 1 tablet (5 mg total) by mouth daily. 06/02/23   Dale [place], MD  aspirin EC 81 MG tablet Take 1 tablet (81 mg total) by mouth daily. Swallow whole. 11/26/22   Enedina Finner, MD  atorvastatin (LIPITOR) 80 MG tablet Take 1 tablet (80 mg total) by mouth daily. Patient taking differently: Take 40 mg by mouth daily. 11/27/22   Leeroy Bock, MD  clopidogrel (PLAVIX) 75 MG tablet Take 1 tablet (75 mg total) by mouth daily with breakfast. 06/02/23   Dale [place], MD  fexofenadine (ALLEGRA) 180 MG tablet Take 1 tablet (180 mg total) by mouth daily. Patient taking differently: Take 180 mg by mouth daily as needed for allergies. 09/10/20   Becky [place], NP  folic acid (FOLVITE) 1 MG tablet Take 1 mg by mouth every evening.     [provider]  glucose blood (BAYER CONTOUR TEST) test strip USE  STRIP TO CHECK GLUCOSE TWICE DAILY. Dx: E11.9 05/29/17   Dale [place], MD  ipratropium (ATROVENT) 0.06 % nasal spray Place 2 sprays into both nostrils 4 (four) times daily. Patient taking differently: Place 2 sprays into both nostrils 4 (four) times daily as needed for rhinitis. 09/10/20   Becky [place], NP  magnesium oxide (MAG-OX) 400 MG tablet Take 1 tablet (400 mg total) by mouth 2 (two) times daily. 06/02/23   Dale [place], MD  metFORMIN (GLUCOPHAGE)  500 MG tablet Take 1 tablet (500 mg total) by mouth daily. 06/02/23   Dale [place], MD  methotrexate (RHEUMATREX) 2.5 MG tablet Take 10 mg by mouth every Wednesday. 01/10/20   [provider]  metoprolol succinate (TOPROL-XL) 100 MG 24 hr tablet Take 1 tablet (100 mg total) by mouth daily. Take with or immediately following a meal. 06/02/23   Dale [place], MD  MICROLET LANCETS MISC Check sugar once daily, [employer] Microlet  Lancets. Dx E11.9 10/02/14   Dale [place], MD  nitroGLYCERIN (NITROSTAT) 0.4 MG SL tablet Place 1 tablet (0.4 mg total) under the tongue every 5 (five) minutes x 3 doses as needed for chest pain. 11/25/22   Enedina Finner, MD  pantoprazole (PROTONIX) 40 MG tablet Take 1 tablet (40 mg total) by mouth daily. 06/02/23   Dale Weinert, MD  spironolactone (ALDACTONE) 25 MG tablet Take 1 tablet (25 mg total) by mouth daily. 06/02/23   Dale Willard, MD  torsemide (DEMADEX) 20 MG tablet Take 1 tablet (20 mg total) by mouth daily. Taking 1/2 tablet just as needed 03/25/23   Debbe Odea, MD  traMADol (ULTRAM) 50 MG tablet Take 1 tablet (50 mg total) by mouth every 8 (eight) hours as needed. 04/13/23   Marrion Coy, MD    Physical Exam: Vitals:   07/18/23 1242 07/18/23 1629 07/18/23 1833 07/18/23 2000  BP: (!) 85/61 (!) 151/71    Pulse: 94 93    Resp: 17 20    Temp: 98 F (36.7 C) 98.6 F (37 C) 98.5 F (36.9 C)   TempSrc: Oral Oral Oral   SpO2: 95% 99%    Weight:    90.7 kg  Height:    5' 2.99" (1.6 m)   General: Not in acute distress HEENT:       Eyes: PERRL, EOMI, no jaundice       ENT: No discharge from the ears and nose, no pharynx injection, no tonsillar enlargement.        Neck: No JVD, no bruit, no mass felt. Heme: No neck lymph node enlargement. Cardiac: S1/S2, RRR, No gallops or rubs. Respiratory: No rales, wheezing, rhonchi or rubs. GI: Soft, nondistended, nontender, no rebound pain, no organomegaly, BS present. GU: No hematuria Ext: has trace  leg edema bilaterally. 1+DP/PT pulse bilaterally. Musculoskeletal: No joint deformities, No joint redness or warmth, no limitation of ROM in spin. Skin: No rashes.  Neuro: Alert, oriented X3, cranial nerves II-XII grossly intact, muscle strength 3 out of 5 in both legs, 5 out of 5 in the right arm, left arm has very minimal weakness on examination. Sensation to light touch intact. Brachial reflex 2+ bilaterally.  Psych: Patient is not psychotic, no suicidal or hemocidal ideation.  Labs on Admission: I have personally reviewed following labs and imaging studies  CBC: Recent Labs  Lab 07/18/23 1246  WBC 6.7  NEUTROABS 5.7  HGB 12.8  HCT 39.4  MCV 104.2*  PLT 159   Basic Metabolic Panel: Recent Labs  Lab 07/18/23 1246  NA 134*  K 4.8  CL 103  CO2 22  GLUCOSE 119*  BUN 21  CREATININE 1.25*  CALCIUM 10.0   GFR: Estimated Creatinine Clearance: 37.1 mL/min (A) (by C-G formula based on SCr of 1.25 mg/dL (H)). Liver Function Tests: Recent Labs  Lab 07/18/23 1246  AST 26  ALT 17  ALKPHOS 83  BILITOT 1.2  PROT 7.0  ALBUMIN 3.4*   No results for input(s): "LIPASE", "AMYLASE" in the last 168 hours. No results for input(s): "AMMONIA" in the last 168 hours. Coagulation Profile: No results for input(s): "INR", "PROTIME" in the last 168 hours. Cardiac Enzymes: No results for input(s): "CKTOTAL", "CKMB", "CKMBINDEX", "TROPONINI" in the last 168 hours. BNP (last 3 results) No results for input(s): "PROBNP" in the last 8760 hours. HbA1C: No results for input(s): "HGBA1C" in the last 72 hours. CBG: No results for input(s): "GLUCAP" in the last 168 hours. Lipid Profile: No results for  input(s): "CHOL", "HDL", "LDLCALC", "TRIG", "CHOLHDL", "LDLDIRECT" in the last 72 hours. Thyroid Function Tests: No results for input(s): "TSH", "T4TOTAL", "FREET4", "T3FREE", "THYROIDAB" in the last 72 hours. Anemia Panel: No results for input(s): "VITAMINB12", "FOLATE", "FERRITIN", "TIBC",  "IRON", "RETICCTPCT" in the last 72 hours. Urine analysis:    Component Value Date/Time   COLORURINE YELLOW (A) 07/18/2023 1847   APPEARANCEUR HAZY (A) 07/18/2023 1847   LABSPEC 1.012 07/18/2023 1847   PHURINE 6.0 07/18/2023 1847   GLUCOSEU NEGATIVE 07/18/2023 1847   GLUCOSEU NEGATIVE 03/18/2023 1408   HGBUR NEGATIVE 07/18/2023 1847   BILIRUBINUR NEGATIVE 07/18/2023 1847   BILIRUBINUR neg 02/04/2023 1305   KETONESUR NEGATIVE 07/18/2023 1847   PROTEINUR NEGATIVE 07/18/2023 1847   UROBILINOGEN 0.2 03/18/2023 1408   NITRITE NEGATIVE 07/18/2023 1847   LEUKOCYTESUR MODERATE (A) 07/18/2023 1847   Sepsis Labs: @LABRCNTIP (procalcitonin:4,lacticidven:4) )No results found for this or any previous visit (from the past 240 hours).   Radiological Exams on Admission:   Assessment/Plan Principal Problem:   UTI (urinary tract infection) Active Problems:   Left arm weakness   Fall at home, initial encounter   CAD (coronary artery disease)   Myocardial injury   Hypercholesterolemia   Type II diabetes mellitus with renal manifestations (HCC)   Hypertension   Chronic diastolic CHF (congestive heart failure) (HCC)   PVD (peripheral vascular disease) (HCC)   Chronic kidney disease, stage 3a (HCC)   IPF (idiopathic pulmonary fibrosis) (HCC)   Rheumatoid arthritis (HCC)   Obesity (BMI 30-39.9)   Gout   Assessment and Plan:   UTI (urinary tract infection):  no fever or leukocytosis, not septic.  -Admit to telemetry bed as inpatient -Levaquin (patient is allergic to cephalosporin) -Follow-up of urine culture  Left arm weakness: Patient has generalized weakness, bilateral leg weakness, also reports left arm weakness.  On my examination, patient has minimal left arm weakness compared to the right arm.  Her weakness is at least partially due to UTI, however we cannot completely rule out possibility of stroke. CT head negative.  - Obtain MRI-brain --> if positive for stroke, will do stroke  workup. - prn IV hydralazine for SBP>220 or dBP>110 - Plavix and ASA  - Statin: liptor - swallowing screen. If fails, will get SLP - PT/OT consult - pt had A1c 6.1 and LDL 32 on 06/24/23, will not repeat A1c and FLP   Addendum_ Acute stroke:  MRI showed many punctate acute infarcts in the right greater than left frontal and parietal lobes and the cerebellum. Given involvement of multiple vascular territories, consider an embolic etiology. -will get CTA -will get 2d echo with bubble -please consult neuro in AM.   Fall at home, initial encounter -PT/OT -Fall precaution  CAD (coronary artery disease) and myocardial injury: trop 39. No CP.  Likely demand ischemia. -Trend troponin -Aspirin, Plavix -Lipitor  Hypercholesterolemia -Lipitor  Type II diabetes mellitus with renal manifestations [facility]): Recent A1c 6.1, well-controlled.  Patient is off metformin recently. -Sliding scale insulin  Hypertension: -Hold metoprolol, amlodipine, torsemide, spironolactone due to hypotension  Chronic diastolic CHF (congestive heart failure) (HCC): 2D echo on 11/23/2022 showed EF of 50-55% grade 3 diastolic dysfunction.  Patient has trace leg edema, no SOB.  CHF seem to be compensated. -Hold torsemide and spironolactone due to hypotension and worsening renal function  PVD (peripheral vascular disease) (HCC) -Aspirin, Plavix, Lipitor  Chronic kidney disease, stage 3a (HCC): Slightly worsening renal function.  Recent baseline creatinine 1.09 on 07/06/2023.  Her creatinine is 1.25, BUN  21, GFR 43 -Hold diuretics -IV fluid: Patient received 500 cc normal saline in ED  IPF (idiopathic pulmonary fibrosis) (HCC): Stable oxygen saturation 99% on room air. -As needed bronchodilators and Mucinex  Rheumatoid arthritis (HCC) -Patient is on weekly methotrexate  Obesity (BMI 30-39.9): Body weight 90.7 kg, BMI 35.43 -Encourage losing weight -Exercise and healthy  diet  Gout -Allopurinol         DVT ppx:  SQ Lovenox  Code Status: Full code    Family Communication:   Yes, patient's husband and son at bed side.    Disposition Plan:  Anticipate discharge back to previous environment  Consults called: None  Admission status and Level of care: Telemetry Medical:  as inpt        Dispo: The patient is from: Home              Anticipated d/c is to: Home              Anticipated d/c date is: 2 days              Patient currently is not medically stable to d/c.    Severity of Illness:  The appropriate patient status for this patient is INPATIENT. Inpatient status is judged to be reasonable and necessary in order to provide the required intensity of service to ensure the patient's safety. The patient's presenting symptoms, physical exam findings, and initial radiographic and laboratory data in the context of their chronic comorbidities is felt to place them at high risk for further clinical deterioration. Furthermore, it is not anticipated that the patient will be medically stable for discharge from the hospital within 2 midnights of admission.   * I certify that at the point of admission it is my clinical judgment that the patient will require inpatient hospital care spanning beyond 2 midnights from the point of admission due to high intensity of service, high risk for further deterioration and high frequency of surveillance required.*       Date of Service 07/18/2023    Lorretta Harp [facility]   If 7PM-7AM, please contact night-coverage [email or web address] 07/18/2023, 9:34 PM

## 2023-07-18 NOTE — Consult Note (Signed)
 Pharmacy Antibiotic Note  Nina Perez is a 83 y.o. female admitted on 07/18/2023 with UTI. PMH significant for CHF, HTN, diabetes, osteoarthritis and pulmonary hypertension. Pharmacy has been consulted for levofloxacin dosing.  Plan: Day 1 of antibiotics Start levofloxacin 250 mg PO Q24H Continue to monitor renal function and follow culture results   Height: 5' 2.99" (160 cm) Weight: 90.7 kg (199 lb 15.3 oz) IBW/kg (Calculated) : 52.38  Temp (24hrs), Avg:98.4 F (36.9 C), Min:98 F (36.7 C), Max:98.6 F (37 C)  Recent Labs  Lab 07/18/23 1246  WBC 6.7  CREATININE 1.25*    Estimated Creatinine Clearance: 37.1 mL/min (A) (by C-G formula based on SCr of 1.25 mg/dL (H)).    Allergies  Allergen Reactions   Ethanol Other (See Comments)    REDNESS AND BLISTERS   Tape Other (See Comments)    Ob band aid adhesive   Pull skin off / Rash  Ob band aid adhesive  Pull skin off / Rash   Triamterene-Hctz Other (See Comments)    Weakness/fatigue   Cephalexin Rash    Blotchiness to B lower legs after several days of cephalexin for UTI   Hydrochlorothiazide W-Triamterene Other (See Comments)    Other reaction(s): Other (See Comments)  Extreme fatigue/weakness  Extreme fatigue/weakness    Other reaction(s): Other (See Comments) Extreme fatigue/weakness    Antimicrobials this admission: 3/8 Levofloxacin >>   Dose adjustments this admission: N/A  Microbiology results: 3/8 UCx: IP   Thank you for allowing pharmacy to be a part of this patient's care.  Celene Squibb, PharmD Clinical Pharmacist 07/18/2023 8:44 PM

## 2023-07-19 ENCOUNTER — Inpatient Hospital Stay

## 2023-07-19 DIAGNOSIS — I639 Cerebral infarction, unspecified: Secondary | ICD-10-CM

## 2023-07-19 DIAGNOSIS — N3000 Acute cystitis without hematuria: Secondary | ICD-10-CM | POA: Diagnosis not present

## 2023-07-19 LAB — TROPONIN I (HIGH SENSITIVITY)
Troponin I (High Sensitivity): 35 ng/L — ABNORMAL HIGH (ref <18)
Troponin I (High Sensitivity): 32 ng/L — ABNORMAL HIGH (ref <18)

## 2023-07-19 LAB — CBG MONITORING, ED
Glucose-Capillary: 69 mg/dL — ABNORMAL LOW (ref 70–99)
Glucose-Capillary: 73 mg/dL (ref 70–99)
Glucose-Capillary: 94 mg/dL (ref 70–99)
Glucose-Capillary: 100 mg/dL — ABNORMAL HIGH (ref 70–99)

## 2023-07-19 MED ORDER — IOHEXOL 350 MG/ML SOLN
75.0000 mL | Freq: Once | INTRAVENOUS | Status: AC | PRN
  Administered 2023-07-19: 75 mL via INTRAVENOUS

## 2023-07-19 NOTE — Progress Notes (Signed)
 Occupational Therapy Evaluation Patient Details Name: Nina Perez MRN: [id number] DOB: [birth year] Today's Date: 07/19/2023   History of Present Illness   Nina Perez is a 83 y.o. female with medical history significant of HTN, HLD, DM, PVD, CAD with stent, dCHF, gout, RA, scleroderma, mild pulmonary fibrosis, pulmonary hypertension, left bundle blockage, CKD 3A, who presents with fall and weakness, urinary frequency     Clinical Impressions Pt was seen for OT evaluation this date. Pt A/O throughout session, husband and daughter at bed side. Prior to hospital admission, pt was living with husband in home, able to live on main level; was ambulating with RW prior to admission without assistance. Pt states  8/10 all over pain from fall. Endorses three falls, with progressive weakness leading up to hospital admission. Pt presents to acute OT demonstrating impaired ADL performance, balance and functional mobility (See OT problem list for additional functional deficits). Pt currently requires MAX A+ 2 for Sit<> supine from the EOB. Pt demonstrates significant weakness in both upper and lower extremities. Pt is very fearful of falling, unwilling to attempt standing on this date despite verbal encouragement. Pt tolerated sitting on EOB ~10 mins, requiring at least single extremity supported throughout often leaning throughout both UE. Pt repositioned in bed, set up to eat lunch. Pt able to feed herself indep. Pt would benefit from skilled OT services to address noted impairments and functional limitations (see below for any additional details) in order to maximize safety and independence while minimizing falls risk and caregiver burden. OT will follow acutely.    If plan is discharge home, recommend the following:   Two people to help with walking and/or transfers;A lot of help with bathing/dressing/bathroom;Assistance with cooking/housework;Assist for transportation;Help with stairs or ramp for entrance      Functional Status Assessment   Patient has had a recent decline in their functional status and demonstrates the ability to make significant improvements in function in a reasonable and predictable amount of time.     Equipment Recommendations   Other (comment) (Next venue of care)     Recommendations for Other Services         Precautions/Restrictions   Precautions Precautions: Fall     Mobility Bed Mobility Overal bed mobility: Needs Assistance Bed Mobility: Supine to Sit, Sit to Supine     Supine to sit: Max assist Sit to supine: Max assist, +2 for physical assistance   General bed mobility comments: Pt required lots of assistance for Sit>supine, pt unable to assist physically    Transfers Overall transfer level: Needs assistance                 General transfer comment: Pt attempted to lateral scoot with Max A, but unable to scoot due to significant LE/UE weakness.      Balance Overall balance assessment: Needs assistance, History of Falls Sitting-balance support: Feet supported, Single extremity supported                                       ADL either performed or assessed with clinical judgement   ADL Overall ADL's : Needs assistance/impaired Eating/Feeding: Independent                   Lower Body Dressing: Maximal assistance;Bed level                 General ADL Comments: Pt limited  ADL completion on this date due to fear of falling. Pt expressed she wouldnt be able to sit on the EOB for long, tolerated sitting on EOB for ~101mins.     Vision Baseline Vision/History: 1 Wears glasses Patient Visual Report: No change from baseline Vision Assessment?: Yes Eye Alignment: Within Functional Limits Ocular Range of Motion: Within Functional Limits Alignment/Gaze Preference: Within Defined Limits Tracking/Visual Pursuits: Able to track stimulus in all quads without difficulty Saccades: Within functional limits                        Pertinent Vitals/Pain Pain Assessment Pain Assessment: 0-10 Pain Score: 8  Pain Location: bottom Pain Descriptors / Indicators: Sore Pain Intervention(s): Repositioned, Monitored during session     Extremity/Trunk Assessment Upper Extremity Assessment Upper Extremity Assessment: Generalized weakness;LUE deficits/detail (Sensation intact) LUE Deficits / Details: Slightly weaker than dominat RUE   Lower Extremity Assessment Lower Extremity Assessment: Generalized weakness       Communication Communication Communication: No apparent difficulties   Cognition Arousal: Alert Behavior During Therapy: Anxious Cognition: No apparent impairments             OT - Cognition Comments: Pt a/o x4                 Following commands: Intact       Cueing  General Comments   Cueing Techniques: Verbal cues;Tactile cues  Brusing noted on pt's R posterior underarm area   Exercises Exercises: Other exercises Other Exercises Other Exercises: Edu: role of OT, benefits of trying to move, safe ADL completion, bed mobility position/sequencing   Shoulder Instructions      Home Living Family/patient expects to be discharged to:: Private residence Living Arrangements: Spouse/significant other Available Help at Discharge: Family Type of Home: House Home Access: Ramped entrance     Home Layout: Two level;Able to live on main level with bedroom/bathroom     Bathroom Shower/Tub: Producer, television/film/video: Handicapped height Bathroom Accessibility: Yes   Home Equipment: Agricultural consultant (2 wheels);Rollator (4 wheels);Shower seat;Grab bars - tub/shower;Grab bars - toilet;Hand held shower head;BSC/3in1;Wheelchair - Building surveyor [employer] on side of bed, reacher, electric bed) Adaptive Equipment: Reacher Additional Comments: Lives on main level; was ambulating with RW prior to admission without assistance. Endorses three falls, with  progressive weakness leading up to hospital admission      Prior Functioning/Environment Prior Level of Function : History of Falls (last six months);Needs assist             Mobility Comments: RW hosue hold distances and in community ADLs Comments:  (Husband assist with dressing, showering, toileting transfer.)    OT Problem List: Decreased strength;Decreased activity tolerance;Impaired balance (sitting and/or standing);Decreased safety awareness;Decreased knowledge of use of DME or AE   OT Treatment/Interventions: Self-care/ADL training;Energy conservation;DME and/or AE instruction;Therapeutic activities;Patient/family education;Balance training;Therapeutic exercise      OT Goals(Current goals can be found in the care plan section)   Acute Rehab OT Goals Patient Stated Goal: to get stronger OT Goal Formulation: With patient Time For Goal Achievement: 08/02/23 Potential to Achieve Goals: Good ADL Goals Pt Will Perform Grooming: sitting;with min assist Pt Will Perform Lower Body Dressing: with adaptive equipment;with mod assist;sitting/lateral leans Pt Will Transfer to Toilet: bedside commode;with mod assist Pt Will Perform Toileting - Clothing Manipulation and hygiene: with min assist;with adaptive equipment   OT Frequency:  Min 2X/week    Co-evaluation  AM-PAC OT "6 Clicks" Daily Activity     Outcome Measure Help from another person eating meals?: None Help from another person taking care of personal grooming?: A Lot Help from another person toileting, which includes using toliet, bedpan, or urinal?: A Lot Help from another person bathing (including washing, rinsing, drying)?: A Lot Help from another person to put on and taking off regular upper body clothing?: A Little Help from another person to put on and taking off regular lower body clothing?: A Lot 6 Click Score: 15   End of Session Equipment Utilized During Treatment: Gait belt;Oxygen Nurse  Communication: Mobility status  Activity Tolerance: Patient tolerated treatment well Patient left: in bed;with call bell/phone within reach;with bed alarm set;with family/visitor present  OT Visit Diagnosis: Unsteadiness on feet (R26.81);Other abnormalities of gait and mobility (R26.89);Repeated falls (R29.6);History of falling (Z91.81);Muscle weakness (generalized) (M62.81)                Time: 1610-9604 OT Time Calculation (min): 32 min Charges:  OT General Charges $OT Visit: 1 Visit OT Evaluation $OT Eval Moderate Complexity: 1 Mod OT Treatments $Self Care/Home Management : 8-22 mins  Glenard Haring M.S. OTR/L  07/19/23, 3:28 PM

## 2023-07-19 NOTE — ED Notes (Signed)
 OT at bedside.

## 2023-07-19 NOTE — Progress Notes (Signed)
 SLP Cancellation Note  Patient Details Name: Nina Perez MRN: [id number] DOB: Dec 01, 1940   Cancelled treatment:       Reason Eval/Treat Not Completed: SLP screened, no needs identified, will sign off (chart reviewed; consulted NSG then met w/ pt and Family in room.)  Pt denied any difficulty swallowing and is currently on a regular diet; tolerates swallowing pills w/ water per NSG.  Pt conversed in conversation w/out expressive/receptive deficits noted; pt denied any speech-language deficits. Speech clear, and pt was 100% intelligible during conversation. Pt stated she does not wear her Bottom Denture plate d/t ill-fit. ANY missing Dentition can impact articulatory precision of speech, but pt and Family denied noting any changes in pt's speech. NSG denied any deficits during engagements.  No further skilled ST services indicated as pt appears at her baseline. Pt agreed. NSG to reconsult if any change in status while admitted.     Jerilynn Som, MS, CCC-SLP Speech Language Pathologist Rehab Services; [facility] [phone] (ascom) Nina Perez 07/19/2023, 12:19 PM

## 2023-07-19 NOTE — Consult Note (Signed)
 NEUROLOGY CONSULT NOTE   Date of service: July 19, 2023 Patient Name: Nina Perez MRN:  [id number] DOB:  09/26/1940 Chief Complaint: "Weakness" Requesting Provider: Deanna Artis, DO  History of Present Illness  Nina Perez is a 83 y.o. female with hx of aortic atherosclerosis, hypertension, interstitial lung disease, osteoarthritis, peripheral artery disease, pulmonary fibrosis, pulmonary hypertension, rheumatoid arthritis on methotrexate, presented to the emergency department for evaluation of weakness and fall. She had a fall in the bathroom while taking a shower on Friday morning 07/17/2023. She landed on her bottom and since then her lower back and bottom have been hurting. Upon evaluation in the ED, she was noted to be somewhat weaker on the left arm.  MRI brain was done which revealed strokes-see below. Denies any chest pain shortness of breath more than usual.  Has been mobility restricted due to recent rehab after hospitalization for cellulitis.  Uses a walker to walk, walks extremely short distances if that due to pain and mobility restrictions from arthritis as well.  LKW: Sometime in the morning of 07/17/2023 Modified rankin score: 4-Needs assistance to walk and tend to bodily needs IV Thrombolysis: No-outside the window EVT: No-outside the window NIHSS 1a Level of Conscious.: 0 1b LOC Questions: 0 1c LOC Commands: 0 2 Best Gaze: 0 3 Visual: 0 4 Facial Palsy: 0 5a Motor Arm - left: 1 5b Motor Arm - Right: 0 6a Motor Leg - Left: 2 6b Motor Leg - Right: 3 7 Limb Ataxia: 0 8 Sensory: 0 9 Best Language: 0 10 Dysarthria: 1 11 Extinct. and Inatten.0:  TOTAL: 7    ROS  Comprehensive ROS performed and pertinent positives documented in HPI   Past History   Past Medical History:  Diagnosis Date   Adenomatous colon polyp    Aortic atherosclerosis (HCC)    Barrett's esophagus    Cardiac murmur    CKD (chronic kidney disease), stage III (HCC)    Dyspnea on exertion     GERD (gastroesophageal reflux disease)    Gout    Hiatal hernia    History of 2019 novel coronavirus disease (COVID-19) 08/2020   Hypercholesterolemia    Hypertension    ILD (interstitial lung disease) (HCC)    a.) 2/2 scleroderma Dx   LBBB (left bundle branch block)    Long term current use of antithrombotics/antiplatelets    a.) ASA + clopidogrel   Long term current use of immunosuppressive drug    a.) MTX for RA and scleroderma Dx   OSA on CPAP    Osteoarthritis    a.) knees, spine   PAD (peripheral artery disease) (HCC)    Phlebitis    a.) x 2 (with pregnancy)   Pulmonary fibrosis (HCC)    a.) mild   Pulmonary hypertension (HCC) 02/24/2012   a.) TTE 02/24/2012: EF 55%, RVSP 33; b.) TTE 10/12/2014: EF >55%, RVSP 36.1; c.) TTE 09/02/2017: EF 55%, RVSP 45.5   Reflux esophagitis    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 b.) renal US 05/23/2020: solid LEFT renal mass measuring 1.4 x 1.5 x 1.6. 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, sclerodactyly, telangiectasias   Skin  cancer 2018   Supplemental oxygen dependent    a.) 2L/Nina Perez PRN for DOE related to mild pulmonary fibrosis and pulmonary HTN   T2DM (type 2 diabetes mellitus) (HCC)     Past Surgical History:  Procedure Laterality Date   ABDOMINAL AORTOGRAM W/LOWER EXTREMITY Left 05/16/2019   Procedure: ABDOMINAL AORTOGRAM W/LOWER EXTREMITY;  Surgeon: Runell Gess, MD;  Location: [facility];  Service: Cardiovascular;  Laterality: Left;   AMPUTATION TOE Left 06/02/2019   Procedure: AMPUTATION TOE;  Surgeon: Recardo Evangelist, DPM;  Location: [facility];  Service: Podiatry;  Laterality: Left;   APPENDECTOMY  1972   BREAST  CYST ASPIRATION Left    neg   COLONOSCOPY WITH PROPOFOL N/A 09/26/2021   Procedure: COLONOSCOPY WITH PROPOFOL;  Surgeon: Jaynie Collins, DO;  Location: [facility];  Service: Gastroenterology;  Laterality: N/A;  DM, WHEELCHAIR, PLAVIX   CORONARY STENT INTERVENTION N/A 11/24/2022   Procedure: CORONARY STENT INTERVENTION;  Surgeon: Iran Ouch, MD;  Location: [facility];  Service: Cardiovascular;  Laterality: N/A;   ESOPHAGOGASTRODUODENOSCOPY (EGD) WITH PROPOFOL N/A 09/26/2021   Procedure: ESOPHAGOGASTRODUODENOSCOPY (EGD) WITH PROPOFOL;  Surgeon: Jaynie Collins, DO;  Location: [facility] ENDOSCOPY;  Service: Gastroenterology;  Laterality: N/A;   INCISION AND DRAINAGE OF WOUND Left 06/07/2019   Procedure: IRRIGATION AND DEBRIDEMENT WOUND;  Surgeon: Recardo Evangelist, DPM;  Location: [facility];  Service: Podiatry;  Laterality: Left;   KNEE ARTHROPLASTY Right 11/13/2021   Procedure: COMPUTER ASSISTED TOTAL KNEE ARTHROPLASTY;  Surgeon: Donato Heinz, MD;  Location: [facility];  Service: Orthopedics;  Laterality: Right;   LEFT HEART CATH AND CORONARY ANGIOGRAPHY Right 11/24/2022   Procedure: LEFT HEART CATH AND CORONARY ANGIOGRAPHY with possible coronary intervention;  Surgeon: Laurier Nancy, MD;  Location: [facility];  Service: Cardiovascular;  Laterality: Right;   LOWER EXTREMITY ANGIOGRAPHY Left 06/03/2019   Procedure: Lower Extremity Angiography;  Surgeon: Renford Dills, MD;  Location: [facility];  Service: Cardiovascular;  Laterality: Left;   LOWER EXTREMITY ANGIOGRAPHY Right 05/21/2021   Procedure: LOWER EXTREMITY ANGIOGRAPHY;  Surgeon: Renford Dills, MD;  Location: [facility];  Service: Cardiovascular;  Laterality: Right;   PERIPHERAL VASCULAR BALLOON ANGIOPLASTY Left 05/16/2019   Procedure: PERIPHERAL VASCULAR BALLOON ANGIOPLASTY;  Surgeon: Runell Gess, MD;  Location: [facility];  Service: Cardiovascular;  Laterality: Left;   SFA   TUBAL LIGATION  1972   VEIN LIGATION AND STRIPPING      Family History: Family History  Problem Relation Age of Onset   Arthritis Mother    Heart attack Father    Cancer Sister    Cancer - Prostate Brother    Colon cancer Neg Hx    Breast cancer Neg Hx     Social History  reports that she quit smoking about 35 years ago. Her smoking use included cigarettes. She started smoking about 50 years ago. She has a 7.5 pack-year smoking history. She has never used smokeless tobacco. She reports that she does not drink alcohol and does not use drugs.  Allergies  Allergen Reactions   Ethanol Other (See Comments)    REDNESS AND BLISTERS   Tape Other (See Comments)    Ob band aid adhesive   Pull skin off / Rash  Ob band aid adhesive  Pull skin off / Rash   Triamterene-Hctz Other (See Comments)    Weakness/fatigue   Cephalexin Rash    Blotchiness to B lower legs after  several days of cephalexin for UTI   Hydrochlorothiazide W-Triamterene Other (See Comments)    Other reaction(s): Other (See Comments)  Extreme fatigue/weakness  Extreme fatigue/weakness    Other reaction(s): Other (See Comments) Extreme fatigue/weakness    Medications   Current Facility-Administered Medications:    acetaminophen (TYLENOL) tablet 650 mg, 650 mg, Oral, Q4H PRN **OR** acetaminophen (TYLENOL) 160 MG/5ML solution 650 mg, 650 mg, Per Tube, Q4H PRN **OR** acetaminophen (TYLENOL) suppository 650 mg, 650 mg, Rectal, Q4H PRN, Lorretta Harp, MD   albuterol (PROVENTIL) (2.5 MG/3ML) 0.083% nebulizer solution 3 mL, 3 mL, Inhalation, Q4H PRN, Lorretta Harp, MD   allopurinol (ZYLOPRIM) tablet 100 mg, 100 mg, Oral, Daily, Lorretta Harp, MD, 100 mg at 07/19/23 0944   aspirin EC tablet 81 mg, 81 mg, Oral, Daily, Lorretta Harp, MD, 81 mg at 07/19/23 0901   atorvastatin (LIPITOR) tablet 40 mg, 40 mg, Oral, Daily, Lorretta Harp, MD, 40 mg at 07/19/23 0901   clopidogrel (PLAVIX) tablet 75 mg, 75 mg, Oral, Q breakfast, Lorretta Harp, MD, 75 mg at 07/19/23 0900   dextromethorphan-guaiFENesin (MUCINEX DM) 30-600 MG per 12 hr tablet 1 tablet, 1 tablet, Oral, BID PRN, Lorretta Harp, MD   enoxaparin (LOVENOX) injection 40 mg, 40 mg, Subcutaneous, Q24H, Lorretta Harp, MD, 40 mg at 07/18/23 2215   folic acid (FOLVITE) tablet 1 mg, 1 mg, Oral, QPM, Lorretta Harp, MD, 1 mg at 07/18/23 2214   hydrALAZINE (APRESOLINE) injection 5 mg, 5 mg, Intravenous, Q2H PRN, Lorretta Harp, MD   insulin aspart (novoLOG) injection 0-5 Units, 0-5 Units, Subcutaneous, QHS, Lorretta Harp, MD   insulin aspart (novoLOG) injection 0-9 Units, 0-9 Units, Subcutaneous, TID WC, Lorretta Harp, MD   levofloxacin (LEVAQUIN) tablet 250 mg, 250 mg, Oral, Daily, Coulter, Carolyn, RPH, 250 mg at 07/19/23 0901   loratadine (CLARITIN) tablet 10 mg, 10 mg, Oral, Daily PRN, Lorretta Harp, MD   magnesium oxide (MAG-OX) tablet 400 mg, 400 mg, Oral, BID, Lorretta Harp, MD, 400 mg at 07/19/23 0944   methocarbamol (ROBAXIN) tablet 500 mg, 500 mg, Oral, Q8H PRN, Lorretta Harp, MD   [START ON 07/22/2023] methotrexate (RHEUMATREX) tablet 10 mg, 10 mg, Oral, Q Wed, Niu, [place], MD   nitroGLYCERIN (NITROSTAT) SL tablet 0.4 mg, 0.4 mg, Sublingual, Q5 Min x 3 PRN, Lorretta Harp, MD   ondansetron (ZOFRAN) injection 4 mg, 4 mg, Intravenous, Q8H PRN, Lorretta Harp, MD   oxyCODONE-acetaminophen (PERCOCET/ROXICET) 5-325 MG per tablet 1 tablet, 1 tablet, Oral, Q4H PRN, Lorretta Harp, MD   senna-docusate (Senokot-S) tablet 1 tablet, 1 tablet, Oral, QHS PRN, Lorretta Harp, MD  Current Outpatient Medications:    acetaminophen (TYLENOL) 650 MG CR tablet, Take 650-1,300 mg by mouth every 8 (eight) hours as needed for pain., Disp: , Rfl:    allopurinol (ZYLOPRIM) 100 MG tablet, Take 1 tablet (100 mg total) by mouth daily., Disp: 90 tablet, Rfl: 1   amLODipine (NORVASC) 5 MG tablet, Take 1 tablet (5 mg total) by mouth daily., Disp: 90 tablet, Rfl: 1   aspirin EC 81 MG tablet, Take 1 tablet (81 mg total) by mouth daily. Swallow  whole., Disp: 30 tablet, Rfl: 12   atorvastatin (LIPITOR) 80 MG tablet, Take 1 tablet (80 mg total) by mouth daily. (Patient taking differently: Take 40 mg by mouth daily.), Disp: , Rfl:    clopidogrel (PLAVIX) 75 MG tablet, Take 1 tablet (75 mg total) by mouth daily with breakfast., Disp: 90 tablet, Rfl: 1   fexofenadine (ALLEGRA) 180 MG tablet, Take  1 tablet (180 mg total) by mouth daily. (Patient taking differently: Take 180 mg by mouth daily as needed for allergies.), Disp: 30 tablet, Rfl: 1   folic acid (FOLVITE) 1 MG tablet, Take 1 mg by mouth every evening. , Disp: , Rfl:    ipratropium (ATROVENT) 0.06 % nasal spray, Place 2 sprays into both nostrils 4 (four) times daily. (Patient taking differently: Place 2 sprays into both nostrils 4 (four) times daily as needed for rhinitis.), Disp: 15 mL, Rfl: 12   magnesium oxide (MAG-OX) 400 MG tablet, Take 1 tablet (400 mg total) by mouth 2 (two) times daily., Disp: 180 tablet, Rfl: 1   methotrexate (RHEUMATREX) 2.5 MG tablet, Take 10 mg by mouth every Wednesday., Disp: , Rfl:    metoprolol succinate (TOPROL-XL) 100 MG 24 hr tablet, Take 1 tablet (100 mg total) by mouth daily. Take with or immediately following a meal., Disp: 90 tablet, Rfl: 1   nitroGLYCERIN (NITROSTAT) 0.4 MG SL tablet, Place 1 tablet (0.4 mg total) under the tongue every 5 (five) minutes x 3 doses as needed for chest pain., Disp: 25 tablet, Rfl: 12   pantoprazole (PROTONIX) 40 MG tablet, Take 1 tablet (40 mg total) by mouth daily., Disp: 90 tablet, Rfl: 3   spironolactone (ALDACTONE) 25 MG tablet, Take 1 tablet (25 mg total) by mouth daily., Disp: 90 tablet, Rfl: 0   torsemide (DEMADEX) 20 MG tablet, Take 1 tablet (20 mg total) by mouth daily. Taking 1/2 tablet just as needed, Disp: 90 tablet, Rfl: 0   traMADol (ULTRAM) 50 MG tablet, Take 1 tablet (50 mg total) by mouth every 8 (eight) hours as needed., Disp: 12 tablet, Rfl: 0   glucose blood (BAYER CONTOUR TEST) test strip, USE  STRIP  TO CHECK GLUCOSE TWICE DAILY. Dx: E11.9, Disp: 100 each, Rfl: 12   metFORMIN (GLUCOPHAGE) 500 MG tablet, Take 1 tablet (500 mg total) by mouth daily. (Patient not taking: Reported on 07/18/2023), Disp: 90 tablet, Rfl: 1   MICROLET LANCETS MISC, Check sugar once daily, [employer] Microlet Lancets. Dx E11.9, Disp: 100 each, Rfl: 12  Vitals   Vitals:   07/19/23 0610 07/19/23 0611 07/19/23 0721 07/19/23 0722  BP:   (!) 140/68   Pulse:   73   Resp:   18   Temp: 98.1 F (36.7 C) 98.1 F (36.7 C)  98.3 F (36.8 C)  TempSrc: Oral Oral  Oral  SpO2: 97%  100%   Weight:      Height:        Body mass index is 35.43 kg/m.  Physical Exam  GEN: Very pleasant lady sitting in bed with supplemental oxygen in place HEENT: Normocephalic atraumatic Lungs: Distant breath sounds Abdomen nondistended nontender Neurological exam She is awake alert oriented x 3 She does not have her lower dentures in place-speech sounds little dysarthric but the family says that is normal for her. No evidence of aphasia Normal attention concentration Cranial nerves II to XII intact Motor examination with no drift in the right upper extremity, mild drift in the left upper extremity.  Her right leg is extremely weak and has been weak for months.  Left leg has some antigravity strength and she is able to give me at least 4 -/5.  Right lower extremity barely 3/5.  There is a lot of component of pain in all joints as well. Sensory exam: Intact Coordination with no dysmetria in the upper extremities.  Unable to check on the lower extremities.  Labs/Imaging/Neurodiagnostic  studies   CBC:  Recent Labs  Lab 2023/07/30 1246  WBC 6.7  NEUTROABS 5.7  HGB 12.8  HCT 39.4  MCV 104.2*  PLT 159   Basic Metabolic Panel:  Lab Results  Component Value Date   NA 134 (L) 2023-07-30   K 4.8 July 30, 2023   CO2 22 07-30-23   GLUCOSE 119 (H) 07-30-23   BUN 21 07/30/2023   CREATININE 1.25 (H) Jul 30, 2023   CALCIUM 10.0 07/30/2023    GFRNONAA 43 (L) 2023-07-30   GFRAA >60 06/09/2019   Lipid Panel:  Lab Results  Component Value Date   LDLCALC 32 06/24/2023   HgbA1c:  Lab Results  Component Value Date   HGBA1C 6.1 06/24/2023   INR  Lab Results  Component Value Date   INR 1.2 07-30-23   APTT  Lab Results  Component Value Date   APTT 40 (H) 2023-07-30   CT Head without contrast(Personally reviewed): No acute intracranial abnormality. CT C-spine with no acute displaced fracture or traumatic listhesis of the cervical spine.  CT angio Head and Neck with contrast(Personally reviewed): No emergent LVO.  Enlarged main pulmonary artery suggestive of pulmonary arterial hypertension.  Aortic atherosclerosis.  MRI Brain(Personally reviewed): Many punctate acute infarcts in the right greater than left frontal and parietal lobes and cerebellum.  ASSESSMENT   JALILAH WILTSIE is a 83 y.o. female with above past medical history presenting for evaluation of generalized weakness and a fall.  On examination noted to have more left-sided upper extremity weakness than baseline.  MRI brain shows scattered punctate infarcts in multiple vascular territories raising concern for an embolic etiology. No prior history of atrial fibrillation. CT angiography head and neck with no emergent LVO.  Impression: Acute ischemic infarcts in multiple vascular distribution raising concern for an embolic etiology.  RECOMMENDATIONS  Frequent neurochecks Telemetry She is on aspirin and Plavix at home-continue for now. High intensity statin for goal LDL less than 70 2D echo A1c Lipid panel PT OT Therapy Can start normalizing blood pressures as her last known well is greater than 48 hours now. Blood pressure goal at discharge 140/90 or below. If no atrial fibrillation is detected during the inpatient telemetry-will need outpatient 30-day cardiac monitoring. Neurology will follow. Plan relayed to Dr.  Sharlene Dory  ______________________________________________________________________    Signed, Milon Dikes, MD [facility]

## 2023-07-19 NOTE — ED Notes (Signed)
 Advised nurse that patient has ready bed

## 2023-07-19 NOTE — ED Notes (Signed)
 Nina Perez, Hospitalist was informed od CT angio head and neck resulted.

## 2023-07-19 NOTE — Progress Notes (Signed)
 Progress Note   Patient: Nina Perez KGM:[id number] DOB: [birth year] DOA: 07/18/2023     1 DOS: the patient was seen and examined on 07/19/2023   Brief hospital course: 83 y.o. female with medical history significant of HTN, HLD, DM, PVD, CAD with stent, dCHF, gout, RA, scleroderma, mild pulmonary fibrosis, pulmonary hypertension, left bundle blockage, CKD 3A, who presents with fall and weakness, urinary frequency.  Subsequently found to have BL CVAs.  Assessment and Plan:  Acute bilateral CVA - Likely contributing etiology to patient's weakness.  MRI noting punctate acute infarcts in bilateral frontal/parietal lobes and cerebellum.  Obvious concern for embolic etiology.  CT angio unremarkable.  Echo pending.  Neurology consulted, appreciate their recommendations.  Will continue aspirin, Plavix, statin.  PT/OT ordered.  Possible urinary infection - Likely contributing etiology of patient's weakness.  UA showing moderate leukocytes with many bacteria.  Urine culture pending.  Continues on empiric ceftriaxone.  NSTEMI CAD with elevated troponins - Likely demand ischemia given history of underlying CAD with stent.  Compliant with DAPT.  Will resume DAPT with statin medications.  No chest pain, shortness of breath to suggest ACS.  Diabetes mellitus - Insulin sliding scale on board.  Hypertension - Mildly hypotensive on presentation.  Holding antihypertensives at this time.  Chronic HFpEF - Does not appear to be in acute exacerbation.  Will restart cardiac regiment as blood pressure allows.  Peripheral vascular disease - Aspirin, Plavix, Lipitor on board.  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  Nebulizers as needed.  Obesity - BMI greater than 35.  Encouraged weight loss, cardiac/diabetic diet.  Gout - Does not appear to be acutely exacerbated.  Allopurinol on board.       Subjective: Patient resting comfortably, husband  and daughter at bedside.  Patient states she feels similar but overall weak.  Per family report, noticed her weakness started about a week ago.  Patient was unable to step up over a small step in the shower.  Patient denies any history of arrhythmia or atrial fibrillation.  No recent illness.  No current shortness of breath, chest pain, nausea, vomiting, abdominal pain.  Physical Exam: Vitals:   07/19/23 0721 07/19/23 0722 07/19/23 1020 07/19/23 1059  BP: (!) 140/68  129/61   Pulse: 73  71   Resp: 18  16   Temp:  98.3 F (36.8 C)  98.3 F (36.8 C)  TempSrc:  Oral  Oral  SpO2: 100%  100%   Weight:      Height:       GENERAL:  Alert, pleasant, no acute distress  HEENT:  EOMI CARDIOVASCULAR:  RRR, no murmurs appreciated RESPIRATORY:  Clear to auscultation, no wheezing, rales, or rhonchi GASTROINTESTINAL:  Soft, nontender, nondistended EXTREMITIES:  No LE edema bilaterally NEURO: CN II through XII intact, no extinction, sensation equal and intact in BL upper and lower extremities, strength 5/5 BL UE with very slight LUE weakness appreciated, slight ataxia appreciated on LUE finger-to-nose, BL LE hip flexion 4/5 worse on the left, LLE foot flexion and extension 4/5, unable to perform heel-to-shin secondary to weakness. SKIN:  No rashes noted PSYCH:  Appropriate mood and affect   Data Reviewed:  There are no new results to review at this time.  Family Communication: Husband and daughter at bedside  Disposition: Status is: Inpatient Remains inpatient appropriate because: Acute CVA  Planned Discharge Destination: Skilled nursing facility    Time spent: 38 minutes  Author: Deanna Artis, DO 07/19/2023 12:20  PM  For on call review [email or web address].

## 2023-07-20 ENCOUNTER — Inpatient Hospital Stay

## 2023-07-20 ENCOUNTER — Encounter: Payer: Self-pay | Admitting: Internal Medicine

## 2023-07-20 ENCOUNTER — Inpatient Hospital Stay (HOSPITAL_COMMUNITY)
Admit: 2023-07-20 | Discharge: 2023-07-20 | Disposition: A | Discharge status: Home / Self Care | Attending: Internal Medicine | Admitting: Internal Medicine

## 2023-07-20 DIAGNOSIS — I6389 Other cerebral infarction: Secondary | ICD-10-CM | POA: Diagnosis not present

## 2023-07-20 DIAGNOSIS — I6340 Cerebral infarction due to embolism of unspecified cerebral artery: Secondary | ICD-10-CM | POA: Diagnosis not present

## 2023-07-20 DIAGNOSIS — I639 Cerebral infarction, unspecified: Secondary | ICD-10-CM | POA: Diagnosis not present

## 2023-07-20 DIAGNOSIS — Z7902 Long term (current) use of antithrombotics/antiplatelets: Secondary | ICD-10-CM | POA: Diagnosis not present

## 2023-07-20 LAB — CBC
HCT: 34.1 % — ABNORMAL LOW (ref 36.0–46.0)
Hemoglobin: 11 g/dL — ABNORMAL LOW (ref 12.0–15.0)
MCH: 33.3 pg (ref 26.0–34.0)
MCHC: 32.3 g/dL (ref 30.0–36.0)
MCV: 103.3 fL — ABNORMAL HIGH (ref 80.0–100.0)
Platelets: 141 10*3/uL — ABNORMAL LOW (ref 150–400)
RBC: 3.3 MIL/uL — ABNORMAL LOW (ref 3.87–5.11)
RDW: 16.3 % — ABNORMAL HIGH (ref 11.5–15.5)
WBC: 4 10*3/uL (ref 4.0–10.5)
nRBC: 0 % (ref 0.0–0.2)

## 2023-07-20 LAB — GLUCOSE, CAPILLARY
Glucose-Capillary: 109 mg/dL — ABNORMAL HIGH (ref 70–99)
Glucose-Capillary: 116 mg/dL — ABNORMAL HIGH (ref 70–99)

## 2023-07-20 LAB — ECHOCARDIOGRAM COMPLETE BUBBLE STUDY
AR max vel: 1.77 cm2
AV Area VTI: 2.05 cm2
AV Area mean vel: 1.76 cm2
AV Mean grad: 10 mmHg
AV Peak grad: 17 mmHg
Ao pk vel: 2.06 m/s
Area-P 1/2: 6.02 cm2
MV VTI: 3.15 cm2
S' Lateral: 2.7 cm

## 2023-07-20 LAB — BASIC METABOLIC PANEL
Anion gap: 8 (ref 5–15)
BUN: 18 mg/dL (ref 8–23)
CO2: 24 mmol/L (ref 22–32)
Calcium: 9.7 mg/dL (ref 8.9–10.3)
Chloride: 104 mmol/L (ref 98–111)
Creatinine, Ser: 1 mg/dL (ref 0.44–1.00)
GFR, Estimated: 56 mL/min — ABNORMAL LOW (ref >60)
Glucose, Bld: 85 mg/dL (ref 70–99)
Potassium: 4.3 mmol/L (ref 3.5–5.1)
Sodium: 136 mmol/L (ref 135–145)

## 2023-07-20 LAB — TSH: TSH: 4.172 u[IU]/mL (ref 0.350–4.500)

## 2023-07-20 LAB — CBG MONITORING, ED: Glucose-Capillary: 71 mg/dL (ref 70–99)

## 2023-07-20 LAB — URINE CULTURE

## 2023-07-20 LAB — PHOSPHORUS: Phosphorus: 2.7 mg/dL (ref 2.5–4.6)

## 2023-07-20 LAB — MAGNESIUM: Magnesium: 1.5 mg/dL — ABNORMAL LOW (ref 1.7–2.4)

## 2023-07-20 MED ORDER — MAGNESIUM SULFATE 2 GM/50ML IV SOLN
2.0000 g | Freq: Once | INTRAVENOUS | Status: AC
  Administered 2023-07-20: 2 g via INTRAVENOUS
  Filled 2023-07-20: qty 50

## 2023-07-20 MED ORDER — MAGNESIUM OXIDE 400 MG PO TABS
400.0000 mg | ORAL_TABLET | Freq: Two times a day (BID) | ORAL | Status: DC
  Administered 2023-07-21 – 2023-07-29 (×17): 400 mg via ORAL
  Filled 2023-07-20 (×31): qty 1

## 2023-07-20 NOTE — Progress Notes (Signed)
 NEUROLOGY CONSULT FOLLOW UP NOTE   Date of service: July 20, 2023 Patient Name: Nina Perez MRN:  [id number] DOB:  1940-09-12  Nina Perez is a 83 y.o. female with hx of aortic atherosclerosis (s/p stent 11/2022), hypertension, interstitial lung disease, osteoarthritis, peripheral artery disease, pulmonary fibrosis, pulmonary hypertension, rheumatoid arthritis on methotrexate, presented to the emergency department for evaluation of weakness and fall. She had a fall in the bathroom while taking a shower on Friday morning 07/17/2023. She landed on her bottom and since then her lower back and bottom have been hurting. Upon evaluation in the ED, she was noted to be somewhat weaker on the left arm.  MRI brain was done which revealed strokes-see below. Denies any chest pain shortness of breath more than usual.   Has been mobility restricted due to recent rehab after hospitalization for cellulitis.  Uses a walker to walk, walks extremely short distances if that due to pain and mobility restrictions from arthritis as well.  Interval Hx/subjective   -Husband notes gradually progressive generalized weakness especially RLE in the past 1-2 months   Vitals   Vitals:   07/20/23 0600 07/20/23 0835 07/20/23 0900 07/20/23 1244  BP: 120/69 138/74  (!) 130/57  Pulse: 78 73  79  Resp: 16 19  19   Temp: 98 F (36.7 C) 98.4 F (36.9 C)  98.5 F (36.9 C)  TempSrc: Oral Oral  Oral  SpO2: 95% 92%  93%  Weight:   89.1 kg   Height:   5\' 6"  (1.676 m)      Body mass index is 31.7 kg/m.  Physical Exam   GEN: Very pleasant lady sitting in bed with supplemental oxygen in place   Neurologic Examination   She is fatigued appearing but alert, follows simple commands  Normal attention concentration Cranial nerves II to XII intact Right deltoid 4+/5, left 4+/5, LUE 3-4 / 5 in an upper motor neuron pattern. RLE 3/5 at the hip, 4/5 with flexion weaker than extension but also limited by allodynia. LLE 4/5 at  the hip, 4+/5 distally again limited by pain with some give way weakness  Sensory exam: Intact Reflexes: 3+ and symmetric brachioradialis, 2+ right patellar, left patellar likely 2+ but pain limited   Medications  Current Facility-Administered Medications:    acetaminophen (TYLENOL) tablet 650 mg, 650 mg, Oral, Q4H PRN **OR** acetaminophen (TYLENOL) 160 MG/5ML solution 650 mg, 650 mg, Per Tube, Q4H PRN **OR** acetaminophen (TYLENOL) suppository 650 mg, 650 mg, Rectal, Q4H PRN, Lorretta Harp, MD   albuterol (PROVENTIL) (2.5 MG/3ML) 0.083% nebulizer solution 3 mL, 3 mL, Inhalation, Q4H PRN, Lorretta Harp, MD   allopurinol (ZYLOPRIM) tablet 100 mg, 100 mg, Oral, Daily, Lorretta Harp, MD, 100 mg at 07/20/23 0931   aspirin EC tablet 81 mg, 81 mg, Oral, Daily, Lorretta Harp, MD, 81 mg at 07/20/23 0931   atorvastatin (LIPITOR) tablet 40 mg, 40 mg, Oral, Daily, Lorretta Harp, MD, 40 mg at 07/20/23 0931   clopidogrel (PLAVIX) tablet 75 mg, 75 mg, Oral, Q breakfast, Lorretta Harp, MD, 75 mg at 07/20/23 0931   dextromethorphan-guaiFENesin (MUCINEX DM) 30-600 MG per 12 hr tablet 1 tablet, 1 tablet, Oral, BID PRN, Lorretta Harp, MD   enoxaparin (LOVENOX) injection 40 mg, 40 mg, Subcutaneous, Q24H, Lorretta Harp, MD, 40 mg at 07/19/23 2148   folic acid (FOLVITE) tablet 1 mg, 1 mg, Oral, QPM, Lorretta Harp, MD, 1 mg at 07/19/23 1727   hydrALAZINE (APRESOLINE) injection 5 mg, 5 mg, Intravenous, Q2H  PRN, Lorretta Harp, MD   insulin aspart (novoLOG) injection 0-5 Units, 0-5 Units, Subcutaneous, QHS, Lorretta Harp, MD   insulin aspart (novoLOG) injection 0-9 Units, 0-9 Units, Subcutaneous, TID WC, Lorretta Harp, MD   levofloxacin (LEVAQUIN) tablet 250 mg, 250 mg, Oral, Daily, Coulter, Carolyn, RPH, 250 mg at 07/20/23 0931   loratadine (CLARITIN) tablet 10 mg, 10 mg, Oral, Daily PRN, Lorretta Harp, MD   Melene Muller ON 07/21/2023] magnesium oxide (MAG-OX) tablet 400 mg, 400 mg, Oral, BID, Gillis Santa, MD   methocarbamol (ROBAXIN) tablet 500 mg, 500 mg,  Oral, Q8H PRN, Lorretta Harp, MD   Melene Muller ON 07/22/2023] methotrexate (RHEUMATREX) tablet 10 mg, 10 mg, Oral, Q Wed, Niu, [place], MD   nitroGLYCERIN (NITROSTAT) SL tablet 0.4 mg, 0.4 mg, Sublingual, Q5 Min x 3 PRN, Lorretta Harp, MD   ondansetron [facility]) injection 4 mg, 4 mg, Intravenous, Q8H PRN, Lorretta Harp, MD   oxyCODONE-acetaminophen (PERCOCET/ROXICET) 5-325 MG per tablet 1 tablet, 1 tablet, Oral, Q4H PRN, Lorretta Harp, MD   senna-docusate (Senokot-S) tablet 1 tablet, 1 tablet, Oral, QHS PRN, Lorretta Harp, MD  Labs and Diagnostic Imaging   CBC:  Recent Labs  Lab 07/18/23 1246 07/20/23 0434  WBC 6.7 4.0  NEUTROABS 5.7  --   HGB 12.8 11.0*  HCT 39.4 34.1*  MCV 104.2* 103.3*  PLT 159 141*    Basic Metabolic Panel:  Lab Results  Component Value Date   NA 136 07/20/2023   K 4.3 07/20/2023   CO2 24 07/20/2023   GLUCOSE 85 07/20/2023   BUN 18 07/20/2023   CREATININE 1.00 07/20/2023   CALCIUM 9.7 07/20/2023   GFRNONAA 56 (L) 07/20/2023   GFRAA >60 06/09/2019   Lipid Panel:  Lab Results  Component Value Date   CHOL 93 06/24/2023   HDL 35.00 (L) 06/24/2023   LDLCALC 32 06/24/2023   TRIG 130.0 06/24/2023   CHOLHDL 3 06/24/2023     HgbA1c:  Lab Results  Component Value Date   HGBA1C 6.1 06/24/2023   INR  Lab Results  Component Value Date   INR 1.2 07/18/2023   APTT  Lab Results  Component Value Date   APTT 40 (H) 07/18/2023    CT angio Head and Neck with contrast(Personally reviewed):  1. No large vessel occlusion or proximal hemodynamically significant stenosis. 2. Enlarged main pulmonary arteries, suggestive of pulmonary arterial hypertension. 3.  Aortic Atherosclerosis (ICD10-I70.0).  MRI Brain(Personally reviewed):  Many punctate acute infarcts in the right greater than left frontal and parietal lobes and the cerebellum. Given involvement of multiple vascular territories, consider an embolic etiology.  ECHO 03/2023:   1. Left ventricular ejection  fraction, by estimation, is 50 to 55%. The  left ventricle has low normal function. The left ventricle has no regional  wall motion abnormalities. There is mild left ventricular hypertrophy.  Left ventricular diastolic  parameters are consistent with Grade I diastolic dysfunction (impaired  relaxation). Elevated left atrial pressure.   2. Right ventricular systolic function is mildly reduced. The right  ventricular size is mildly enlarged. Severely increased right ventricular  wall thickness. Tricuspid regurgitation signal is inadequate for assessing  PA pressure.   3. Left atrial size was mild to moderately dilated.   4. The mitral valve is degenerative. Mild to moderate mitral valve  regurgitation. No evidence of mitral stenosis.   5. The aortic valve is tricuspid. There is mild thickening of the aortic  valve. Aortic valve regurgitation is mild. Aortic valve sclerosis is  present, with  no evidence of aortic valve stenosis.   6. The inferior vena cava is normal in size with <50% respiratory  variability, suggesting right atrial pressure of 8 mmHg.   7. Agitated saline contrast bubble study was negative, with no evidence  of any interatrial shunt.    ECHO 07/20/2023 (today)   1. Left ventricular ejection fraction, by estimation, is 55 to 60%. The  left ventricle has normal function. The left ventricle has no regional  wall motion abnormalities. There is moderate left ventricular hypertrophy.  Left ventricular diastolic  parameters are consistent with Grade I diastolic dysfunction (impaired  relaxation).   2. Right ventricular systolic function is normal. The right ventricular  size is normal.   3. Left atrial size was mildly dilated.   4. The mitral valve is normal in structure. No evidence of mitral valve  regurgitation. No evidence of mitral stenosis. Moderate mitral annular  calcification.   5. The aortic valve is normal in structure. Aortic valve regurgitation is  not  visualized. Mild aortic valve stenosis. Aortic valve area, by VTI  measures 2.05 cm. Aortic valve mean gradient measures 10.0 mmHg.   6. The inferior vena cava is normal in size with greater than 50%  respiratory variability, suggesting right atrial pressure of 3 mmHg.   7. Agitated saline contrast bubble study was negative, with no evidence  of any interatrial shunt.   Assessment   Nina Perez is a 83 y.o. female with a past medical history of multiple vascular risk factors as above.  Multifocal embolic appearing strokes concerning for central embolic source but no proven source at this time, cryptogenic etiology  Also with her generalized gradually progressive weakness and rheumatoid arthritis I do think cervical spine imaging is reasonable to confirm there is no other etiology such as rheumatoid arthritis associated pannus causing cervical spine compression  Recommendations  -Continue home atorvastatin 80 mg daily, meeting LDL goal of less than 70 at 32 -Meeting A1c goal of less than 7% at 6.1% -On chronic DAPT since her cardiac stent, this should be continued for at least 21 days from a stroke risk perspective but if an indication for anticoagulation is determined would need de-escalation of her antiplatelet therapy -MRI C-spine due to progressive weakness and RA, rule out compression contributing to her frequent falls especially since on DAPT -30 day event monitor on discharge -Neuro will follow-up MRI C-spine, otherwise will sign off at this time, please reach out with any additional questions/concerns ______________________________________________________________________  Brooke Dare MD-PhD [facility] [phone]

## 2023-07-20 NOTE — Consult Note (Signed)
 [facility] Liaison Note  07/20/2023  Nina Perez [birth year] [id number]  Location: RN Hospital Liaison met patient at bedside at [facility].  Insurance: Medicare   Nina Perez is a 83 y.o. female who is a Primary Care Patient of Dale Tyrone, MD [facility] at [facility]. The patient was screened for readmission hospitalization with noted high risk score for unplanned readmission risk with 2 IP in 6 months.  The patient was assessed for potential Care Management service needs for post hospital transition for care coordination. Review of patient's electronic medical record reveals patient was admitted for UTI. Pt recommended for SNF for STR. Liaison visited pt and spouse at bedside today and educated on VBCI services. Liaison has informed both if they are interested in services with VBCI once pt has returned toher residence to make such request with her primary care provider who will then make a referral for services. If pt's discharge is to a facility affiliated with VBCI liaison will make are referral for VBCI PAC-RN to follow up accordingly. If the facility is not affiliated with VBCI the facility will continue to address pt's ongoing needs.  Plan: [facility] Liaison will continue to follow progress and disposition to asess for post hospital community care coordination/management needs.  Referral request for community care coordination: pending disposition.   VBCI Care Management/Population Health does not replace or interfere with any arrangements made by the Inpatient Transition of Care team.   For questions contact:   Elliot Cousin, RN, BSN Hospital Liaison [place]   [facility], Population Health Office Hours MTWF  8:00 am-6:00 pm Direct Dial: [phone] mobile [email or web address]

## 2023-07-20 NOTE — Progress Notes (Signed)
 Progress Note   Patient: Nina Perez UJW:[id number] DOB: 07/04/1940 DOA: 07/18/2023     2 DOS: the patient was seen and examined on 07/20/2023   Brief hospital course: 83 y.o. female with medical history significant of HTN, HLD, DM, PVD, CAD with stent, dCHF, gout, RA, scleroderma, mild pulmonary fibrosis, pulmonary hypertension, left bundle blockage, CKD 3A, who presents with fall and weakness, urinary frequency.  Subsequently found to have BL CVAs.  Assessment and Plan:  Acute bilateral CVA - Likely contributing etiology to patient's weakness.   MRI noting punctate acute infarcts in bilateral frontal/parietal lobes and cerebellum.  Obvious concern for embolic etiology.  CT angio unremarkable.  Echo LVEF 55 to 60%, moderate LV hypertrophy, grade 1 diastolic dysfunction LDL 32, below goal 70, A1c 6.1, below goal 7%, TSH 4.1 WNL Neuro consulted, recommended to continue DAPT due to recent cardiac stent, continue statin.  30 day Cardiac monitor on discharge Informed to [facility] [place] for cardiac monitor PT/OT evaluation for possible SNF placement Follow TOC for discharge planning Follow MRI C-spine due to persistent weakness  Pyuria, UTI ruled out urine culture growing multiple species, suggestive reticulation  WBC within normal range S/p ceftriaxone and Levaquin DC'd on 3/10 Monitor off antibiotics for now  CAD s/p cardiac stent, slightly elevated troponin resumed DAPT with statin medications.   No chest pain, shortness of breath to suggest ACS.  Diabetes mellitus - Insulin sliding scale on board.  Hypertension - Mildly hypotensive on presentation.  Holding antihypertensives at this time.  Chronic HFpEF - Does not appear to be in acute exacerbation.  Will restart cardiac regiment as blood pressure allows.  Peripheral vascular disease - Aspirin, Plavix, Lipitor on board.  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   Nebulizers as needed.  Hypomagnesemia, mag repleted. Monitor electrolytes  Obesity, class I BMI 31.7 Encouraged weight loss, cardiac/diabetic diet.  Gout - Does not appear to be acutely exacerbated.  Allopurinol on board.   DVT prophylaxis: Lovenox [place]   Active Pressure Injury/Wound(s)     Pressure Ulcer  Duration          Pressure Injury 04/11/23 Heel Left Stage 1 -  Intact skin with non-blanchable redness of a localized area usually over a bony prominence. 100 days            Subjective: No significant events overnight, patient was resting comfortably, denied any chest pain or repressing, no shortness of breath.  Patient was complaining of weakness in the left arm, no any other complaints.   Physical Exam: Vitals:   07/20/23 0835 07/20/23 0900 07/20/23 1244 07/20/23 1617  BP: 138/74  (!) 130/57 (!) 146/62  Pulse: 73  79 80  Resp: 19  19 19   Temp: 98.4 F (36.9 C)  98.5 F (36.9 C) 98.5 F (36.9 C)  TempSrc: Oral  Oral Oral  SpO2: 92%  93% 95%  Weight:  89.1 kg    Height:  5\' 6"  (1.676 m)     GENERAL:  Alert, pleasant, no acute distress  HEENT:  EOMI CARDIOVASCULAR:  RRR, no murmurs appreciated RESPIRATORY:  Clear to auscultation, no wheezing, rales, or rhonchi GASTROINTESTINAL:  Soft, nontender, nondistended EXTREMITIES:  No LE edema bilaterally NEURO: CN II through XII intact, no extinction, sensation equal and intact in BL upper and lower extremities, strength 5/5 BL UE with very slight LUE weakness appreciated, slight ataxia appreciated on LUE finger-to-nose, BL LE hip flexion 4/5 worse on the left, LLE foot flexion  and extension 4/5, unable to perform heel-to-shin secondary to weakness. SKIN:  No rashes noted PSYCH:  Appropriate mood and affect   Data Reviewed:  There are no new results to review at this time.  Family Communication: Husband and daughter at bedside  Disposition: Status is: Inpatient Remains inpatient appropriate because: Acute CVA   Planned Discharge Destination: Skilled nursing facility    Time spent: 38 minutes  Author: Gillis Santa, MD 07/20/2023 4:52 PM  For on call review [email or web address].

## 2023-07-20 NOTE — Progress Notes (Signed)
 Patient arrived to floor on bed. NAD, VSS. Oriented to room, husband at bedside. Bed locked and low, alarms on.

## 2023-07-20 NOTE — Progress Notes (Signed)
 Pt currently refusing to get up. Per PT, pt is scared to get up to stand at the side of the bed d/t frequent falls. Pt educated on risk/benefits to standing and moving. Pt continues to refuse to get out of bed.

## 2023-07-20 NOTE — Evaluation (Signed)
 Physical Therapy Evaluation Patient Details Name: Nina Perez MRN: [id number] DOB: 07/30/1940 Today's Date: 07/20/2023  History of Present Illness  Patient is a 83 year old female presenting with generalized weakness and fall. 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 Recent rehab after hospitalization for cellulitis. History of aortic atherosclerosis, hypertension, interstitial lung disease, osteoarthritis, peripheral artery disease, pulmonary fibrosis, pulmonary hypertension, rheumatoid arthritis  Clinical Impression  Patient is agreeable to PT evaluation. Supportive family at the bedside. She was discharged from rehab in January. She has been able to walk short distance with her rolling walker and gets home health PT. History of falls.  Today the patient required assistance for bed mobility. Sitting balance is fair. She expressed fear of falling due to several prior falls. Max A required for lateral scooting to the right with cues for technique. She is fatigued with activity. Recommend to continue PT to maximize independence. Rehabilitation < 3 hours/day recommended after this hospital stay.       If plan is discharge home, recommend the following: Two people to help with walking and/or transfers;A lot of help with bathing/dressing/bathroom;Assist for transportation;Help with stairs or ramp for entrance;Assistance with cooking/housework   Can travel by private vehicle   No    Equipment Recommendations None recommended by PT  Recommendations for Other Services       Functional Status Assessment Patient has had a recent decline in their functional status and demonstrates the ability to make significant improvements in function in a reasonable and predictable amount of time.     Precautions / Restrictions Precautions Precautions: Fall Recall of Precautions/Restrictions: Intact Restrictions Weight Bearing Restrictions Per Provider Order: No       Mobility  Bed Mobility Overal bed mobility: Needs Assistance Bed Mobility: Supine to Sit, Sit to Supine     Supine to sit: Mod assist, HOB elevated Sit to supine: Max assist   General bed mobility comments: verbal cues for technique    Transfers Overall transfer level: Needs assistance   Transfers: Bed to chair/wheelchair/BSC            Lateral/Scoot Transfers: Max assist General transfer comment: verbal cues for technique for incremental lateral scooting to the right x 3 bouts. patient reports fear of falling and declined standing attempts today. anticipate she will need +2 person for standing.    Ambulation/Gait                  Stairs            Wheelchair Mobility     Tilt Bed    Modified Rankin (Stroke Patients Only)       Balance Overall balance assessment: Needs assistance, History of Falls Sitting-balance support: Feet supported Sitting balance-Leahy Scale: Fair                                       Pertinent Vitals/Pain Pain Assessment Pain Assessment: Faces Faces Pain Scale: Hurts little more Pain Location: bottom Pain Descriptors / Indicators: Sore Pain Intervention(s): Limited activity within patient's tolerance, Monitored during session, Repositioned    Home Living Family/patient expects to be discharged to:: Private residence Living Arrangements: Spouse/significant other Available Help at Discharge: Family Type of Home: House Home Access: Ramped entrance       Home Layout: Two level;Able to live on main level with bedroom/bathroom (ramped entrance inside to get to the living  room) Home Equipment: Rolling Walker (2 wheels);Rollator (4 wheels);Shower seat;Grab bars - tub/shower;Grab bars - toilet;Hand held shower head;BSC/3in1;Wheelchair - manual;Adaptive equipment (bed rail, electric bed) Additional Comments: progressive weakness. 3 falls reported. was getting home health PT    Prior Function Prior Level  of Function : History of Falls (last six months);Needs assist             Mobility Comments: RW used for household distances ADLs Comments: spouse assisted as needed     Extremity/Trunk Assessment   Upper Extremity Assessment Upper Extremity Assessment: Generalized weakness;Defer to OT evaluation    Lower Extremity Assessment Lower Extremity Assessment: Generalized weakness (4+/5 dorsiflexion/plantarflexion, hip add/abd, unable to SLR)       Communication   Communication Communication: No apparent difficulties    Cognition Arousal: Alert Behavior During Therapy: WFL for tasks assessed/performed (verbally reports fear of falling)   PT - Cognitive impairments: No apparent impairments                         Following commands: Intact       Cueing Cueing Techniques: Verbal cues, Tactile cues     General Comments General comments (skin integrity, edema, etc.): supportive family at the bedside    Exercises     Assessment/Plan    PT Assessment Patient needs continued PT services  PT Problem List Decreased strength;Decreased range of motion;Decreased balance;Decreased activity tolerance;Decreased mobility       PT Treatment Interventions DME instruction;Gait training;Stair training;Functional mobility training;Therapeutic activities;Therapeutic exercise;Balance training;Neuromuscular re-education;Cognitive remediation;Patient/family education;Wheelchair mobility training    PT Goals (Current goals can be found in the Care Plan section)  Acute Rehab PT Goals Patient Stated Goal: to go to rehab to get stronger PT Goal Formulation: With patient Time For Goal Achievement: 08/03/23 Potential to Achieve Goals: Fair    Frequency Min 2X/week     Co-evaluation               AM-PAC PT "6 Clicks" Mobility  Outcome Measure Help needed turning from your back to your side while in a flat bed without using bedrails?: A Lot Help needed moving from lying  on your back to sitting on the side of a flat bed without using bedrails?: A Lot Help needed moving to and from a bed to a chair (including a wheelchair)?: Total Help needed standing up from a chair using your arms (e.g., wheelchair or bedside chair)?: Total Help needed to walk in hospital room?: Total Help needed climbing 3-5 steps with a railing? : Total 6 Click Score: 8    End of Session   Activity Tolerance: Patient tolerated treatment well Patient left: in bed;with call bell/phone within reach;with bed alarm set;with family/visitor present Nurse Communication: Mobility status PT Visit Diagnosis: Muscle weakness (generalized) (M62.81);Unsteadiness on feet (R26.81)    Time: 7829-5621 PT Time Calculation (min) (ACUTE ONLY): 25 min   Charges:   PT Evaluation $PT Eval Moderate Complexity: 1 Mod PT Treatments $Therapeutic Activity: 8-22 mins PT General Charges $$ ACUTE PT VISIT: 1 Visit         Donna Bernard, PT, MPT   Ina Homes 07/20/2023, 9:38 AM

## 2023-07-20 NOTE — Progress Notes (Signed)
*  PRELIMINARY RESULTS* Echocardiogram 2D Echocardiogram has been performed.  Carolyne Fiscal 07/20/2023, 11:41 AM

## 2023-07-21 DIAGNOSIS — N3000 Acute cystitis without hematuria: Secondary | ICD-10-CM | POA: Diagnosis not present

## 2023-07-21 DIAGNOSIS — R531 Weakness: Secondary | ICD-10-CM

## 2023-07-21 LAB — CBC
HCT: 34.9 % — ABNORMAL LOW (ref 36.0–46.0)
Hemoglobin: 11.4 g/dL — ABNORMAL LOW (ref 12.0–15.0)
MCH: 33.4 pg (ref 26.0–34.0)
MCHC: 32.7 g/dL (ref 30.0–36.0)
MCV: 102.3 fL — ABNORMAL HIGH (ref 80.0–100.0)
Platelets: 137 10*3/uL — ABNORMAL LOW (ref 150–400)
RBC: 3.41 MIL/uL — ABNORMAL LOW (ref 3.87–5.11)
RDW: 16.3 % — ABNORMAL HIGH (ref 11.5–15.5)
WBC: 4 10*3/uL (ref 4.0–10.5)
nRBC: 0 % (ref 0.0–0.2)

## 2023-07-21 LAB — GLUCOSE, CAPILLARY
Glucose-Capillary: 76 mg/dL (ref 70–99)
Glucose-Capillary: 72 mg/dL (ref 70–99)
Glucose-Capillary: 78 mg/dL (ref 70–99)
Glucose-Capillary: 126 mg/dL — ABNORMAL HIGH (ref 70–99)
Glucose-Capillary: 99 mg/dL (ref 70–99)

## 2023-07-21 LAB — MAGNESIUM: Magnesium: 1.9 mg/dL (ref 1.7–2.4)

## 2023-07-21 LAB — BASIC METABOLIC PANEL
Anion gap: 8 (ref 5–15)
BUN: 20 mg/dL (ref 8–23)
CO2: 23 mmol/L (ref 22–32)
Calcium: 10.1 mg/dL (ref 8.9–10.3)
Chloride: 103 mmol/L (ref 98–111)
Creatinine, Ser: 1.03 mg/dL — ABNORMAL HIGH (ref 0.44–1.00)
GFR, Estimated: 54 mL/min — ABNORMAL LOW (ref >60)
Glucose, Bld: 87 mg/dL (ref 70–99)
Potassium: 4.3 mmol/L (ref 3.5–5.1)
Sodium: 134 mmol/L — ABNORMAL LOW (ref 135–145)

## 2023-07-21 LAB — PHOSPHORUS: Phosphorus: 2.8 mg/dL (ref 2.5–4.6)

## 2023-07-21 MED ORDER — BISACODYL 5 MG PO TBEC
10.0000 mg | DELAYED_RELEASE_TABLET | Freq: Once | ORAL | Status: AC
  Administered 2023-07-21: 10 mg via ORAL
  Filled 2023-07-21: qty 2

## 2023-07-21 MED ORDER — POLYETHYLENE GLYCOL 3350 17 G PO PACK
17.0000 g | PACK | Freq: Two times a day (BID) | ORAL | Status: DC
  Administered 2023-07-21 – 2023-07-29 (×10): 17 g via ORAL
  Filled 2023-07-21 (×14): qty 1

## 2023-07-21 MED ORDER — BISACODYL 10 MG RE SUPP: 10.0000 mg | Freq: Every day | RECTAL | Status: DC | PRN

## 2023-07-21 MED ORDER — BISACODYL 5 MG PO TBEC
10.0000 mg | DELAYED_RELEASE_TABLET | Freq: Every day | ORAL | Status: DC
  Administered 2023-07-21 – 2023-07-24 (×3): 10 mg via ORAL
  Filled 2023-07-21 (×5): qty 2

## 2023-07-21 MED ORDER — METOPROLOL SUCCINATE ER 25 MG PO TB24
25.0000 mg | ORAL_TABLET | Freq: Every day | ORAL | Status: DC
  Administered 2023-07-21 – 2023-07-29 (×9): 25 mg via ORAL
  Filled 2023-07-21 (×9): qty 1

## 2023-07-21 NOTE — Plan of Care (Signed)
  Problem: Education: Goal: Ability to describe self-care measures that may prevent or decrease complications (Diabetes Survival Skills Education) will improve Outcome: Progressing    Problem: Nutrition: Goal: Adequate nutrition will be maintained Outcome: Progressing   Problem: Pain Managment: Goal: General experience of comfort will improve and/or be controlled Outcome: Progressing   Problem: Safety: Goal: Ability to remain free from injury will improve Outcome: Progressing   Problem: Skin Integrity: Goal: Risk for impaired skin integrity will decrease Outcome: Progressing

## 2023-07-21 NOTE — Progress Notes (Signed)
 Occupational Therapy Treatment Patient Details Name: Nina Perez MRN: [id number] DOB: 11/18/1940 Today's Date: 07/21/2023   History of present illness Patient is a 83 year old female presenting with generalized weakness and fall. 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 Recent rehab after hospitalization for cellulitis. History of aortic atherosclerosis, hypertension, interstitial lung disease, osteoarthritis, peripheral artery disease, pulmonary fibrosis, pulmonary hypertension, rheumatoid arthritis   OT comments  Pt seen for OT treatment on this date. Upon arrival to room pt sitting on EOB with PT in room. Pt was agreeable to tx, encouragement required for continued participation. Pt complete STS from EOB with MOD A +2, later steps x2 bouts, total ~6 steps along the bed. MOD A +2 for weight shifing/upright posture. Bed mobility; MAX A +2 physical assistance for trunk and LE control. Pt on RA throughout session with no SOB noted. Pt making good progress toward goals, will continue to follow POC. Discharge recommendation remains appropriate.        If plan is discharge home, recommend the following:  Two people to help with walking and/or transfers;A lot of help with bathing/dressing/bathroom;Assistance with cooking/housework;Assist for transportation;Help with stairs or ramp for entrance   Equipment Recommendations  Other (comment)    Recommendations for Other Services      Precautions / Restrictions Precautions Precautions: Fall Recall of Precautions/Restrictions: Intact Restrictions Weight Bearing Restrictions Per Provider Order: No       Mobility Bed Mobility Overal bed mobility: Needs Assistance Bed Mobility: Sit to Supine     Supine to sit: Max assist, +2 for physical assistance Sit to supine: Max assist, +2 for physical assistance   General bed mobility comments: Verbal cues throughout, physical assistance for trunk and LE control     Transfers Overall transfer level: Needs assistance Equipment used: Rolling walker (2 wheels) Transfers: Sit to/from Stand Sit to Stand: +2 physical assistance, Mod assist           General transfer comment: Pt complete STS from EOB with MOD A +2, later steps x2 bouts, total ~6 steps along the bed. MOD A +2 for weight shifing/upright posture. Required encouragement to for continued participation.     Balance Overall balance assessment: Needs assistance, History of Falls Sitting-balance support: Feet supported Sitting balance-Leahy Scale: Fair   Postural control: Right lateral lean Standing balance support: Bilateral upper extremity supported, During functional activity, Reliant on assistive device for balance Standing balance-Leahy Scale: Poor                             ADL either performed or assessed with clinical judgement   ADL Overall ADL's : Needs assistance/impaired Eating/Feeding: Modified independent   Grooming: Wash/dry face;Sitting                               Functional mobility during ADLs: Rolling walker (2 wheels);+2 for physical assistance;Moderate assistance General ADL Comments: Pt completed face washing while sitting on EOB, verbal cues to initate task and to maintain upright posture (R lateral lean)    Communication Communication Communication: No apparent difficulties   Cognition Arousal: Alert Behavior During Therapy: WFL for tasks assessed/performed Cognition: No apparent impairments             OT - Cognition Comments: Pt a/o x4                 Following  commands: Intact        Cueing   Cueing Techniques: Verbal cues, Tactile cues  Exercises Exercises: Other exercises Other Exercises Other Exercises: Edu: DME management, hand placement    Shoulder Instructions       General Comments Noted improved participation on this date    Pertinent Vitals/ Pain       Pain Assessment Pain Assessment:  Faces Faces Pain Scale: Hurts little more Pain Location: bottom Pain Descriptors / Indicators: Sore Pain Intervention(s): Limited activity within patient's tolerance, Monitored during session, Repositioned   Frequency  Min 2X/week        Progress Toward Goals  OT Goals(current goals can now be found in the care plan section)  Progress towards OT goals: Progressing toward goals  Acute Rehab OT Goals Patient Stated Goal: to get stronger OT Goal Formulation: With patient Time For Goal Achievement: 08/02/23 Potential to Achieve Goals: Good  Plan      Co-evaluation                 AM-PAC OT "6 Clicks" Daily Activity     Outcome Measure   Help from another person eating meals?: None Help from another person taking care of personal grooming?: A Lot Help from another person toileting, which includes using toliet, bedpan, or urinal?: A Lot Help from another person bathing (including washing, rinsing, drying)?: A Lot Help from another person to put on and taking off regular upper body clothing?: A Little Help from another person to put on and taking off regular lower body clothing?: A Lot 6 Click Score: 15    End of Session Equipment Utilized During Treatment: Rolling walker (2 wheels);Gait belt  OT Visit Diagnosis: Unsteadiness on feet (R26.81);Other abnormalities of gait and mobility (R26.89);Repeated falls (R29.6);History of falling (Z91.81);Muscle weakness (generalized) (M62.81)   Activity Tolerance Patient tolerated treatment well   Patient Left in bed;with call bell/phone within reach;with bed alarm set;with family/visitor present   Nurse Communication Mobility status        Time: 2952-8413 OT Time Calculation (min): 13 min  Charges: OT General Charges $OT Visit: 1 Visit OT Treatments $Therapeutic Activity: 8-22 mins  Glenard Haring M.S. OTR/L  07/21/23, 11:55 AM

## 2023-07-21 NOTE — Progress Notes (Signed)
 Progress Note   Patient: Nina Perez QMV:[id number] DOB: Feb 24, 1941 DOA: 07/18/2023     3 DOS: the patient was seen and examined on 07/21/2023   Brief hospital course: 83 y.o. female with medical history significant of HTN, HLD, DM, PVD, CAD with stent, dCHF, gout, RA, scleroderma, mild pulmonary fibrosis, pulmonary hypertension, left bundle blockage, CKD 3A, who presents with fall and weakness, urinary frequency.  Subsequently found to have BL CVAs.  Assessment and Plan:  Acute bilateral CVA - Likely contributing etiology to patient's weakness.   MRI noting punctate acute infarcts in bilateral frontal/parietal lobes and cerebellum.  Obvious concern for embolic etiology.  CT angio unremarkable.  Echo LVEF 55 to 60%, moderate LV hypertrophy, grade 1 diastolic dysfunction LDL 32, below goal 70, A1c 6.1, below goal 7%, TSH 4.1 WNL Neuro consulted, recommended to continue DAPT due to recent cardiac stent, continue statin.  30 day Cardiac monitor on discharge Informed to Cardio [facility] for cardiac monitor PT/OT evaluation for possible SNF placement Follow TOC for discharge planning Follow MRI C-spine due to persistent weakness  Cervical stenosis MRI C-spine: Mild spinal canal stenosis at C4-5 and C5-6. Severe left C4-5 and C5-6 neural foraminal stenosis.  Neurology recommended that neuroforaminal stenosis could be contributing to some of her left upper extremity weakness, given pain and effort limitations on exam would need EMG/nerve conduction study to confirm which could be completed outpatient if desired by patient/family.    Pyuria, UTI ruled out Patient denied any dysuria, no UTI symptoms urine culture growing multiple species, suggestive reticulation  WBC within normal range S/p ceftriaxone and Levaquin DC'd on 3/10 Monitor off antibiotics for now  CAD s/p cardiac stent, slightly elevated troponin resumed DAPT with statin medications.   No chest pain, shortness of breath to  suggest ACS.  Diabetes mellitus - Insulin sliding scale on board.  Hypertension - Mildly hypotensive on presentation.  Holding antihypertensives at this time.  Chronic HFpEF - Does not appear to be in acute exacerbation.  Will restart cardiac regiment as blood pressure allows.  Peripheral vascular disease - Aspirin, Plavix, Lipitor on board.  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  Nebulizers as needed.  Hypomagnesemia, mag repleted. Monitor electrolytes  Obesity, class I BMI 31.7 Encouraged weight loss, cardiac/diabetic diet.  Gout - Does not appear to be acutely exacerbated.  Allopurinol on board.   DVT prophylaxis: Lovenox [place]   Active Pressure Injury/Wound(s)     Pressure Ulcer  Duration          Pressure Injury 04/11/23 Heel Left Stage 1 -  Intact skin with non-blanchable redness of a localized area usually over a bony prominence. 100 days            Subjective: No significant events overnight, patient was resting comfortably, denied any chest pain or repressing, no shortness of breath.   No improvement in the left arm weakness, no new neurological symptoms.    Physical Exam: Vitals:   07/20/23 2040 07/21/23 0101 07/21/23 0806 07/21/23 1149  BP: 138/62 (!) 145/66 (!) 146/75 138/70  Pulse: 94 85 71 73  Resp: 18 18 17 15   Temp: 98.9 F (37.2 C) 98.9 F (37.2 C) 97.9 F (36.6 C) 98.1 F (36.7 C)  TempSrc: Oral     SpO2: 100% 92% 96% 95%  Weight:      Height:       GENERAL:  Alert, pleasant, no acute distress  HEENT:  EOMI CARDIOVASCULAR:  RRR, no  murmurs appreciated RESPIRATORY:  Clear to auscultation, no wheezing, rales, or rhonchi GASTROINTESTINAL:  Soft, nontender, nondistended EXTREMITIES:  No LE edema bilaterally NEURO: CN II through XII intact, no extinction, sensation equal and intact in BL upper and lower extremities, strength 5/5 BL UE with very slight LUE weakness appreciated,  slight ataxia appreciated on LUE finger-to-nose, BL LE hip flexion 4/5 worse on the left, LLE foot flexion and extension 4/5, unable to perform heel-to-shin secondary to weakness. SKIN:  No rashes noted PSYCH:  Appropriate mood and affect   Data Reviewed:  There are no new results to review at this time.  Family Communication: Husband at bedside  Disposition: Status is: Inpatient Remains inpatient appropriate because: Acute CVA  Planned Discharge Destination: Skilled nursing facility    Patient is stable to discharge to SNF when bed will be available.  Time spent: 35 minutes  Author: Gillis Santa, MD 07/21/2023 4:12 PM  For on call review [email or web address].

## 2023-07-21 NOTE — Care Management Important Message (Signed)
 Important Message  Patient Details  Name: Nina Perez MRN: [id number] Date of Birth: 14-Sep-1940   Important Message Given:  Yes - Medicare IM     Cristela Blue, CMA 07/21/2023, 10:39 AM

## 2023-07-21 NOTE — Progress Notes (Signed)
 Physical Therapy Treatment Patient Details Name: Nina Perez MRN: [id number] DOB: 1940-05-27 Today's Date: 07/21/2023   History of Present Illness Patient is a 83 year old female presenting with generalized weakness and fall. 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 Recent rehab after hospitalization for cellulitis. History of aortic atherosclerosis, hypertension, interstitial lung disease, osteoarthritis, peripheral artery disease, pulmonary fibrosis, pulmonary hypertension, rheumatoid arthritis    PT Comments  To EOB with mod a x 1 and bed features with inc time.  Steady in sitting for extended time for BLE ex.  OT arrives for mobility and she is able to stand x 3 at EOB with mod a x 2 and heavy cues for poor quality sidesteps along bed.  ADL tasks and linen change before transitioning back to supine.  Unable to safely step to chair at this time due to fatigue.   If plan is discharge home, recommend the following: Two people to help with walking and/or transfers;A lot of help with bathing/dressing/bathroom;Assist for transportation;Help with stairs or ramp for entrance;Assistance with cooking/housework   Can travel by private vehicle        Equipment Recommendations  None recommended by PT    Recommendations for Other Services       Precautions / Restrictions Precautions Precautions: Fall Recall of Precautions/Restrictions: Intact Restrictions Weight Bearing Restrictions Per Provider Order: No     Mobility  Bed Mobility Overal bed mobility: Needs Assistance Bed Mobility: Sit to Supine, Supine to Sit     Supine to sit: Mod assist, Used rails, HOB elevated Sit to supine: Max assist, +2 for physical assistance   General bed mobility comments: Verbal cues throughout, physical assistance for trunk and LE control Patient Response: Cooperative  Transfers Overall transfer level: Needs assistance Equipment used: Rolling walker (2  wheels) Transfers: Sit to/from Stand Sit to Stand: +2 physical assistance, Mod assist           General transfer comment: Pt complete STS from EOB with MOD A +2, later steps x2 bouts, total ~6 steps along the bed. MOD A +2 for weight shifing/upright posture. Required encouragement to for continued participation.    Ambulation/Gait Ambulation/Gait assistance: Mod assist, +2 physical assistance Gait Distance (Feet): 2 Feet Assistive device: Rolling walker (2 wheels) Gait Pattern/deviations: Step-to pattern Gait velocity: dec     General Gait Details: very poor quality sidesteps along bed with max encouragement   Stairs             Wheelchair Mobility     Tilt Bed Tilt Bed Patient Response: Cooperative  Modified Rankin (Stroke Patients Only)       Balance Overall balance assessment: Needs assistance, History of Falls Sitting-balance support: Feet supported Sitting balance-Leahy Scale: Fair Sitting balance - Comments: initially does well but with time she fatigues and needs min a to maintain Postural control: Right lateral lean Standing balance support: Bilateral upper extremity supported, During functional activity, Reliant on assistive device for balance Standing balance-Leahy Scale: Poor                              Communication Communication Communication: No apparent difficulties  Cognition Arousal: Alert Behavior During Therapy: WFL for tasks assessed/performed   PT - Cognitive impairments: No apparent impairments  Cueing    Exercises Other Exercises Other Exercises: seated AROM    General Comments General comments (skin integrity, edema, etc.): Noted improved participation on this date      Pertinent Vitals/Pain Pain Assessment Pain Assessment: Faces Faces Pain Scale: Hurts little more Pain Location: bottom Pain Descriptors / Indicators: Sore Pain Intervention(s): Limited activity  within patient's tolerance, Monitored during session, Repositioned    Home Living                          Prior Function            PT Goals (current goals can now be found in the care plan section) Progress towards PT goals: Progressing toward goals    Frequency    Min 2X/week      PT Plan      Co-evaluation PT/OT/SLP Co-Evaluation/Treatment: Yes Reason for Co-Treatment: Complexity of the patient's impairments (multi-system involvement) PT goals addressed during session: Mobility/safety with mobility OT goals addressed during session: ADL's and self-care      AM-PAC PT "6 Clicks" Mobility   Outcome Measure  Help needed turning from your back to your side while in a flat bed without using bedrails?: A Lot Help needed moving from lying on your back to sitting on the side of a flat bed without using bedrails?: A Lot Help needed moving to and from a bed to a chair (including a wheelchair)?: Total Help needed standing up from a chair using your arms (e.g., wheelchair or bedside chair)?: A Lot Help needed to walk in hospital room?: Total Help needed climbing 3-5 steps with a railing? : Total 6 Click Score: 9    End of Session Equipment Utilized During Treatment: Gait belt Activity Tolerance: Patient tolerated treatment well;Patient limited by fatigue Patient left: in bed;with call bell/phone within reach;with bed alarm set;with family/visitor present Nurse Communication: Mobility status PT Visit Diagnosis: Muscle weakness (generalized) (M62.81);Unsteadiness on feet (R26.81)     Time: 0454-0981 PT Time Calculation (min) (ACUTE ONLY): 26 min  Charges:    $Therapeutic Activity: 8-22 mins PT General Charges $$ ACUTE PT VISIT: 1 Visit                   Danielle Dess, PTA 07/21/23, 12:48 PM

## 2023-07-21 NOTE — Plan of Care (Signed)

## 2023-07-21 NOTE — Progress Notes (Signed)
 MRI C-spine reviewed, agree with radiology: 1. Mild spinal canal stenosis at C4-5 and C5-6. 2. Severe left C4-5 and C5-6 neural foraminal stenosis.  Certainly her neuroforaminal stenosis could be contributing to some of her left upper extremity weakness, given pain and effort limitations on exam would need EMG/nerve conduction study to confirm which could be completed outpatient if desired by patient/family.  Certainly with her comorbidities she is unlikely to be a good surgical candidate but this question may also be deferred to the outpatient setting  Inpatient neurology will sign off at this time.  Please do not hesitate to reach out with any additional questions or concerns.  Discussed with primary team via secure chat  Brooke Dare MD-PhD [facility] [phone] Available 7 AM to 7 PM, outside these hours please contact Neurologist on call listed on AMION

## 2023-07-22 DIAGNOSIS — N3000 Acute cystitis without hematuria: Secondary | ICD-10-CM | POA: Diagnosis not present

## 2023-07-22 LAB — BASIC METABOLIC PANEL
Anion gap: 8 (ref 5–15)
BUN: 21 mg/dL (ref 8–23)
CO2: 23 mmol/L (ref 22–32)
Calcium: 10 mg/dL (ref 8.9–10.3)
Chloride: 103 mmol/L (ref 98–111)
Creatinine, Ser: 0.92 mg/dL (ref 0.44–1.00)
GFR, Estimated: >60 mL/min (ref >60)
Glucose, Bld: 89 mg/dL (ref 70–99)
Potassium: 4.2 mmol/L (ref 3.5–5.1)
Sodium: 134 mmol/L — ABNORMAL LOW (ref 135–145)

## 2023-07-22 LAB — CBC
HCT: 34.8 % — ABNORMAL LOW (ref 36.0–46.0)
Hemoglobin: 11.2 g/dL — ABNORMAL LOW (ref 12.0–15.0)
MCH: 32.8 pg (ref 26.0–34.0)
MCHC: 32.2 g/dL (ref 30.0–36.0)
MCV: 102.1 fL — ABNORMAL HIGH (ref 80.0–100.0)
Platelets: 144 10*3/uL — ABNORMAL LOW (ref 150–400)
RBC: 3.41 MIL/uL — ABNORMAL LOW (ref 3.87–5.11)
RDW: 16.2 % — ABNORMAL HIGH (ref 11.5–15.5)
WBC: 4 10*3/uL (ref 4.0–10.5)
nRBC: 0 % (ref 0.0–0.2)

## 2023-07-22 LAB — GLUCOSE, CAPILLARY
Glucose-Capillary: 88 mg/dL (ref 70–99)
Glucose-Capillary: 77 mg/dL (ref 70–99)
Glucose-Capillary: 46 mg/dL — ABNORMAL LOW (ref 70–99)
Glucose-Capillary: 54 mg/dL — ABNORMAL LOW (ref 70–99)
Glucose-Capillary: 89 mg/dL (ref 70–99)
Glucose-Capillary: 127 mg/dL — ABNORMAL HIGH (ref 70–99)

## 2023-07-22 LAB — MAGNESIUM: Magnesium: 1.9 mg/dL (ref 1.7–2.4)

## 2023-07-22 LAB — PHOSPHORUS: Phosphorus: 3 mg/dL (ref 2.5–4.6)

## 2023-07-22 MED ORDER — AMLODIPINE BESYLATE 5 MG PO TABS
5.0000 mg | ORAL_TABLET | Freq: Every day | ORAL | Status: DC
  Administered 2023-07-22 – 2023-07-29 (×8): 5 mg via ORAL
  Filled 2023-07-22 (×8): qty 1

## 2023-07-22 MED ORDER — SMOG ENEMA
960.0000 mL | Freq: Once | RECTAL | Status: DC
  Filled 2023-07-22: qty 960

## 2023-07-22 MED ORDER — BISACODYL 10 MG RE SUPP
10.0000 mg | Freq: Once | RECTAL | Status: AC
  Administered 2023-07-22: 10 mg via RECTAL
  Filled 2023-07-22: qty 1

## 2023-07-22 MED ORDER — DEXTROSE 50 % IV SOLN: 12.5000 g | INTRAVENOUS | Status: AC

## 2023-07-22 NOTE — Progress Notes (Signed)
 PT Cancellation Note  Patient Details Name: Nina Perez MRN: [id number] DOB: April 11, 1941   Cancelled Treatment:    Reason Eval/Treat Not Completed: Other (comment) Attempted to see pt multiple times today.  Working with other staff, low blood glucose, needing to use bed pan.  Upon return later in the day she pleasantly refused and reporting fatigue from long day but does endorse desire to work with PT tomorrow if possible.   Malachi Pro 07/22/2023, 5:48 PM

## 2023-07-22 NOTE — Plan of Care (Addendum)
 Patient is alert and oriented X 4. Pt blood sugar was 46, gave juice and rechecked blood sugar is 89. No any other symptoms noted . MD notified.  She has a large bowel movement today. Plan of care ongoing.  Problem: Education: Goal: Ability to describe self-care measures that may prevent or decrease complications (Diabetes Survival Skills Education) will improve Outcome: Progressing Goal: Individualized Educational Video(s) Outcome: Progressing   Problem: Coping: Goal: Ability to adjust to condition or change in health will improve Outcome: Progressing   Problem: Fluid Volume: Goal: Ability to maintain a balanced intake and output will improve Outcome: Progressing   Problem: Metabolic: Goal: Ability to maintain appropriate glucose levels will improve Outcome: Progressing   Problem: Skin Integrity: Goal: Risk for impaired skin integrity will decrease Outcome: Progressing   Problem: Tissue Perfusion: Goal: Adequacy of tissue perfusion will improve Outcome: Progressing   Problem: Ischemic Stroke/TIA Tissue Perfusion: Goal: Complications of ischemic stroke/TIA will be minimized Outcome: Progressing

## 2023-07-22 NOTE — Progress Notes (Signed)
 Progress Note   Patient: Nina Perez ZOX:[id number] DOB: 1941-05-11 DOA: 07/18/2023     4 DOS: the patient was seen and examined on 07/22/2023   Brief hospital course: 83 y.o. female with medical history significant of HTN, HLD, DM, PVD, CAD with stent, dCHF, gout, RA, scleroderma, mild pulmonary fibrosis, pulmonary hypertension, left bundle blockage, CKD 3A, who presents with fall and weakness, urinary frequency.  Subsequently found to have BL CVAs.  Assessment and Plan:  Acute bilateral CVA - Likely contributing etiology to patient's weakness.   MRI noting punctate acute infarcts in bilateral frontal/parietal lobes and cerebellum.  Obvious concern for embolic etiology.  CT angio unremarkable.  Echo LVEF 55 to 60%, moderate LV hypertrophy, grade 1 diastolic dysfunction LDL 32, below goal 70, A1c 6.1, below goal 7%, TSH 4.1 WNL Neuro consulted, recommended to continue DAPT due to recent cardiac stent, continue statin.  30 day Cardiac monitor on discharge Informed to Cardio [facility] for cardiac monitor PT/OT evaluation for possible SNF placement Follow TOC for discharge planning Follow MRI C-spine due to persistent weakness  Cervical stenosis MRI C-spine: Mild spinal canal stenosis at C4-5 and C5-6. Severe left C4-5 and C5-6 neural foraminal stenosis.  Neurology recommended that neuroforaminal stenosis could be contributing to some of her left upper extremity weakness, given pain and effort limitations on exam would need EMG/nerve conduction study to confirm which could be completed outpatient if desired by patient/family.    Pyuria, UTI ruled out Patient denied any dysuria, no UTI symptoms urine culture growing multiple species, suggestive reticulation  WBC within normal range S/p ceftriaxone and Levaquin DC'd on 3/10 Monitor off antibiotics for now  CAD s/p cardiac stent, slightly elevated troponin resumed DAPT with statin medications.   No chest pain, shortness of breath to  suggest ACS.  Diabetes mellitus - Insulin sliding scale on board.  Hypertension - Mildly hypotensive on presentation.  Holding antihypertensives at this time.  Chronic HFpEF - Does not appear to be in acute exacerbation.  Will restart cardiac regiment as blood pressure allows.  Peripheral vascular disease - Aspirin, Plavix, Lipitor on board.  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  Nebulizers as needed.  Hypomagnesemia, mag repleted. Monitor electrolytes  Obesity, class I BMI 31.7 Encouraged weight loss, cardiac/diabetic diet.  Gout - Does not appear to be acutely exacerbated.  Allopurinol on board.  Constipation, started laxatives.  Suppository x 1 given on 3/12, Use enema if no BM   DVT prophylaxis: Lovenox [place]   Active Pressure Injury/Wound(s)     Pressure Ulcer  Duration          Pressure Injury 04/11/23 Heel Left Stage 1 -  Intact skin with non-blanchable redness of a localized area usually over a bony prominence. 100 days            Subjective: No significant events overnight, patient was resting comfortably, denied any chest pain or repressing, no shortness of breath.   No improvement in the left arm weakness, no new neurological symptoms. Pt has constipation, took medications, no Abd pain   Physical Exam: Vitals:   07/22/23 0120 07/22/23 0517 07/22/23 0834 07/22/23 1127  BP: (!) 142/66 139/66 (!) 146/75 (!) 152/92  Pulse: 76 74 83 71  Resp: 17 17 18 18   Temp: 98.7 F (37.1 C) 99 F (37.2 C) 98.3 F (36.8 C) 98.9 F (37.2 C)  TempSrc:  Oral    SpO2: 92% 91% 93% 92%  Weight:  Height:       GENERAL:  Alert, pleasant, no acute distress  HEENT:  EOMI CARDIOVASCULAR:  RRR, no murmurs appreciated RESPIRATORY:  Clear to auscultation, no wheezing, rales, or rhonchi GASTROINTESTINAL:  Soft, nontender, nondistended EXTREMITIES:  No LE edema bilaterally NEURO: CN II through XII intact, no  extinction, sensation equal and intact in BL upper and lower extremities, strength 5/5 BL UE with very slight LUE weakness appreciated, slight ataxia appreciated on LUE finger-to-nose, BL LE hip flexion 4/5 worse on the left, LLE foot flexion and extension 4/5, unable to perform heel-to-shin secondary to weakness. SKIN:  No rashes noted PSYCH:  Appropriate mood and affect   Data Reviewed:  There are no new results to review at this time.  Family Communication: Husband at bedside  Disposition: Status is: Inpatient Remains inpatient appropriate because: Acute CVA  Planned Discharge Destination: Skilled nursing facility    Patient is stable to discharge to SNF when bed will be available.  Time spent: 35 minutes  Author: Gillis Santa, MD 07/22/2023 2:57 PM  For on call review [email or web address].

## 2023-07-22 NOTE — Plan of Care (Signed)
  Problem: Education: Goal: Ability to describe self-care measures that may prevent or decrease complications (Diabetes Survival Skills Education) will improve Outcome: Progressing   Problem: Coping: Goal: Ability to adjust to condition or change in health will improve Outcome: Progressing   Problem: Metabolic: Goal: Ability to maintain appropriate glucose levels will improve Outcome: Progressing   Problem: Nutritional: Goal: Maintenance of adequate nutrition will improve Outcome: Progressing   Problem: Skin Integrity: Goal: Risk for impaired skin integrity will decrease Outcome: Progressing   Problem: Education: Goal: Knowledge of General Education information will improve Description: Including pain rating scale, medication(s)/side effects and non-pharmacologic comfort measures Outcome: Progressing

## 2023-07-23 DIAGNOSIS — I6340 Cerebral infarction due to embolism of unspecified cerebral artery: Secondary | ICD-10-CM

## 2023-07-23 LAB — PHOSPHORUS: Phosphorus: 2.9 mg/dL (ref 2.5–4.6)

## 2023-07-23 LAB — MAGNESIUM: Magnesium: 1.8 mg/dL (ref 1.7–2.4)

## 2023-07-23 LAB — CBC
HCT: 36.1 % (ref 36.0–46.0)
Hemoglobin: 11.8 g/dL — ABNORMAL LOW (ref 12.0–15.0)
MCH: 33.3 pg (ref 26.0–34.0)
MCHC: 32.7 g/dL (ref 30.0–36.0)
MCV: 102 fL — ABNORMAL HIGH (ref 80.0–100.0)
Platelets: 130 10*3/uL — ABNORMAL LOW (ref 150–400)
RBC: 3.54 MIL/uL — ABNORMAL LOW (ref 3.87–5.11)
RDW: 16.2 % — ABNORMAL HIGH (ref 11.5–15.5)
WBC: 6.1 10*3/uL (ref 4.0–10.5)
nRBC: 0 % (ref 0.0–0.2)

## 2023-07-23 LAB — GLUCOSE, CAPILLARY
Glucose-Capillary: 96 mg/dL (ref 70–99)
Glucose-Capillary: 107 mg/dL — ABNORMAL HIGH (ref 70–99)
Glucose-Capillary: 127 mg/dL — ABNORMAL HIGH (ref 70–99)
Glucose-Capillary: 112 mg/dL — ABNORMAL HIGH (ref 70–99)

## 2023-07-23 LAB — BASIC METABOLIC PANEL
Anion gap: 6 (ref 5–15)
BUN: 20 mg/dL (ref 8–23)
CO2: 25 mmol/L (ref 22–32)
Calcium: 10 mg/dL (ref 8.9–10.3)
Chloride: 102 mmol/L (ref 98–111)
Creatinine, Ser: 0.9 mg/dL (ref 0.44–1.00)
GFR, Estimated: >60 mL/min (ref >60)
Glucose, Bld: 92 mg/dL (ref 70–99)
Potassium: 4.5 mmol/L (ref 3.5–5.1)
Sodium: 133 mmol/L — ABNORMAL LOW (ref 135–145)

## 2023-07-23 MED ORDER — OXYCODONE HCL 5 MG PO TABS
2.5000 mg | ORAL_TABLET | Freq: Four times a day (QID) | ORAL | Status: DC | PRN
  Administered 2023-07-23 – 2023-07-29 (×14): 2.5 mg via ORAL
  Filled 2023-07-23 (×15): qty 1

## 2023-07-23 NOTE — Plan of Care (Signed)
  Problem: Education: Goal: Ability to describe self-care measures that may prevent or decrease complications (Diabetes Survival Skills Education) will improve Outcome: Progressing   Problem: Coping: Goal: Ability to adjust to condition or change in health will improve Outcome: Progressing   Problem: Skin Integrity: Goal: Risk for impaired skin integrity will decrease Outcome: Progressing   Problem: Ischemic Stroke/TIA Tissue Perfusion: Goal: Complications of ischemic stroke/TIA will be minimized Outcome: Progressing   Problem: Nutrition: Goal: Risk of aspiration will decrease Outcome: Progressing Goal: Dietary intake will improve Outcome: Progressing   Problem: Pain Managment: Goal: General experience of comfort will improve and/or be controlled Outcome: Progressing   Problem: Safety: Goal: Ability to remain free from injury will improve Outcome: Progressing

## 2023-07-23 NOTE — Progress Notes (Signed)
 Occupational Therapy Treatment Patient Details Name: Nina Perez MRN: [id number] DOB: 10-Jun-1940 Today's Date: 07/23/2023   History of present illness Patient is a 83 year old female presenting with generalized weakness and fall. 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 Recent rehab after hospitalization for cellulitis. History of aortic atherosclerosis, hypertension, interstitial lung disease, osteoarthritis, peripheral artery disease, pulmonary fibrosis, pulmonary hypertension, rheumatoid arthritis   OT comments  Pt seen for brief OT tx limited by bilateral shoulder pain and pain from red spot on R lateral side of abdominal fold. Pt required MAX A +2 for anterior boost in bed 2/2 pt limited in participating 2/2 bilat shoulder pain. MAX A for pericare and applying powder (in room) to abdominal fold her pt/family request. Pt/family educated in repositioning for comfort and advocating for improved pain control (family notes pt declined pain meds earlier but MD encouraged her to take something). RN notified of pain. TOC notified of family's request for an update regarding placement. Will continue to progress as able.       If plan is discharge home, recommend the following:  Two people to help with walking and/or transfers;A lot of help with bathing/dressing/bathroom;Assistance with cooking/housework;Assist for transportation;Help with stairs or ramp for entrance   Equipment Recommendations  Other (comment) (defer)    Recommendations for Other Services      Precautions / Restrictions Precautions Precautions: Fall Recall of Precautions/Restrictions: Intact Restrictions Weight Bearing Restrictions Per Provider Order: No       Mobility Bed Mobility Overal bed mobility: Needs Assistance             General bed mobility comments: MAX A +2 for anterior shift up in bed, shoulder pain limiting ability to assist    Transfers                    General transfer comment: deferred 2/2 pain     Balance                                           ADL either performed or assessed with clinical judgement   ADL Overall ADL's : Needs assistance/impaired                             Toileting- Clothing Manipulation and Hygiene: Bed level;Maximal assistance Toileting - Clothing Manipulation Details (indicate cue type and reason): MAX A for pericare, applying powder to folds per pt/dtr request            Extremity/Trunk Assessment              Vision       Perception     Praxis     Communication     Cognition Arousal: Alert Behavior During Therapy: WFL for tasks assessed/performed Cognition: No apparent impairments                                        Cueing      Exercises      Shoulder Instructions       General Comments      Pertinent Vitals/ Pain       Pain Assessment Pain Assessment: Faces Faces Pain Scale: Hurts even more Pain Location: bilat shoulders,  red spot on R side of abdominal fold Pain Descriptors / Indicators: Grimacing, Guarding Pain Intervention(s): Limited activity within patient's tolerance, Monitored during session, Repositioned, Patient requesting pain meds-RN notified  Home Living                                          Prior Functioning/Environment              Frequency  Min 2X/week        Progress Toward Goals  OT Goals(current goals can now be found in the care plan section)  Progress towards OT goals: OT to reassess next treatment  Acute Rehab OT Goals Patient Stated Goal: get stronger OT Goal Formulation: With patient/family Time For Goal Achievement: 08/02/23 Potential to Achieve Goals: Good  Plan      Co-evaluation                 AM-PAC OT "6 Clicks" Daily Activity     Outcome Measure   Help from another person eating meals?: None Help from another person taking care  of personal grooming?: A Lot Help from another person toileting, which includes using toliet, bedpan, or urinal?: A Lot Help from another person bathing (including washing, rinsing, drying)?: A Lot Help from another person to put on and taking off regular upper body clothing?: A Lot Help from another person to put on and taking off regular lower body clothing?: A Lot 6 Click Score: 14    End of Session    OT Visit Diagnosis: Unsteadiness on feet (R26.81);Other abnormalities of gait and mobility (R26.89);Repeated falls (R29.6);History of falling (Z91.81);Muscle weakness (generalized) (M62.81)   Activity Tolerance Patient limited by pain   Patient Left in bed;with call bell/phone within reach;with bed alarm set;with family/visitor present   Nurse Communication Patient requests pain meds (RN - pain meds; TOC - update on placement)        Time: 1610-9604 OT Time Calculation (min): 10 min  Charges: OT General Charges $OT Visit: 1 Visit OT Treatments $Self Care/Home Management : 8-22 mins  Arman Filter., MPH, MS, OTR/L ascom [phone] 07/23/23, 2:48 PM

## 2023-07-23 NOTE — Progress Notes (Signed)
 Progress Note   Patient: Nina Perez OZH:[id number] DOB: [birth year] DOA: 07/18/2023     5 DOS: the patient was seen and examined on 07/23/2023   Brief hospital course: 83 y.o. female with medical history significant of HTN, HLD, DM, PVD, CAD with stent, dCHF, gout, RA, scleroderma, mild pulmonary fibrosis, pulmonary hypertension, left bundle blockage, CKD 3A, who presents with fall and weakness, urinary frequency.  Subsequently found to have BL CVAs.  Assessment and Plan:  Acute bilateral CVA - Likely contributing etiology to patient's weakness.   MRI noting punctate acute infarcts in bilateral frontal/parietal lobes and cerebellum.  Obvious concern for embolic etiology.  CT angio unremarkable.  Echo LVEF 55 to 60%, moderate LV hypertrophy, grade 1 diastolic dysfunction LDL 32, below goal 70, A1c 6.1, below goal 7%, TSH 4.1 WNL Neuro consulted, recommended to continue DAPT due to recent cardiac stent, continue statin.  30 day Cardiac monitor on discharge Informed to [facility] [place] for cardiac monitor PT/OT evaluation for possible SNF placement Follow TOC for discharge planning Follow MRI C-spine due to persistent weakness  Cervical stenosis MRI C-spine: Mild spinal canal stenosis at C4-5 and C5-6. Severe left C4-5 and C5-6 neural foraminal stenosis.  Neurology recommended that neuroforaminal stenosis could be contributing to some of her left upper extremity weakness, given pain and effort limitations on exam would need EMG/nerve conduction study to confirm which could be completed outpatient if desired by patient/family.    Pyuria, UTI ruled out Patient denied any dysuria, no UTI symptoms urine culture growing multiple species, suggestive reticulation  WBC within normal range S/p ceftriaxone and Levaquin DC'd on 3/10 Monitor off antibiotics for now  CAD s/p cardiac stent, slightly elevated troponin resumed DAPT with statin medications.   No chest pain, shortness of breath to  suggest ACS.  Diabetes mellitus - Insulin sliding scale on board.  Hypertension - Mildly hypotensive on presentation.  Holding antihypertensives at this time.  Chronic HFpEF - Does not appear to be in acute exacerbation.  Will restart cardiac regiment as blood pressure allows.  Peripheral vascular disease - Aspirin, Plavix, Lipitor on board.  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  Nebulizers as needed.  Hypomagnesemia, mag repleted. Monitor electrolytes  Obesity, class I BMI 31.7 Encouraged weight loss, cardiac/diabetic diet.  Gout - Does not appear to be acutely exacerbated.  Allopurinol on board.  Constipation, started laxatives.  Suppository x 1 given on 3/12, Use enema if no BM  Pt had a BM on 3/12  DVT prophylaxis: Lovenox Deputy   Active Pressure Injury/Wound(s)     Pressure Ulcer  Duration          Pressure Injury 04/11/23 Heel Left Stage 1 -  Intact skin with non-blanchable redness of a localized area usually over a bony prominence. 100 days            Subjective: No significant events overnight, patient was complaining of pain in her bilateral shoulders more on the left side.  Patient was advised to take oxycodone if no improvement after Tylenol and also apply heat..  Patient denied any chest pain or palpitations, no shortness of breath, no any other complaints. Constipation resolved.   Physical Exam: Vitals:   07/23/23 0122 07/23/23 0421 07/23/23 0726 07/23/23 1143  BP: (!) 132/56 139/68 (!) 128/54 126/63  Pulse: 79 73 69 66  Resp: 16 18 16 16   Temp: 99.1 F (37.3 C) 98.2 F (36.8 C) 98.4 F (36.9 C) 98.3 F (36.8  C)  TempSrc: Oral Oral Oral   SpO2: 97% 94% 97% 98%  Weight:      Height:       GENERAL:  Alert, pleasant, no acute distress  HEENT:  EOMI CARDIOVASCULAR:  RRR, no murmurs appreciated RESPIRATORY:  Clear to auscultation, no wheezing, rales, or rhonchi GASTROINTESTINAL:   Soft, nontender, nondistended EXTREMITIES:  No LE edema bilaterally NEURO: CN II through XII intact, no extinction, sensation equal and intact in BL upper and lower extremities, strength 5/5 BL UE with very slight LUE weakness appreciated, slight ataxia appreciated on LUE finger-to-nose, BL LE hip flexion 4/5 worse on the left, LLE foot flexion and extension 4/5, unable to perform heel-to-shin secondary to weakness. SKIN:  No rashes noted PSYCH:  Appropriate mood and affect   Data Reviewed:  There are no new results to review at this time.  Family Communication: Husband at bedside  Disposition: Status is: Inpatient Remains inpatient appropriate because: Acute CVA  Planned Discharge Destination: Skilled nursing facility    Patient is stable to discharge to SNF when bed will be available.  Time spent: 35 minutes  Author: Gillis Santa, MD 07/23/2023 12:45 PM  For on call review [email or web address].

## 2023-07-23 NOTE — Progress Notes (Signed)
 Physical Therapy Treatment Patient Details Name: Nina Perez MRN: [id number] DOB: [birth year] Today's Date: 07/23/2023   History of Present Illness Patient is a 83 year old female presenting with generalized weakness and fall. 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 Recent rehab after hospitalization for cellulitis. History of aortic atherosclerosis, hypertension, interstitial lung disease, osteoarthritis, peripheral artery disease, pulmonary fibrosis, pulmonary hypertension, rheumatoid arthritis    PT Comments  Pt reports feeling poorly and 8/10 pain in neck and shoulders.  She initially refused PT for today, but with encouragement and cuing did agree to some supine LE exercises.  She showed good effort with these but again endorsed too much pain and fatigue to do mobility/sit up/etc.  She had good strength and quality of motion on the R, but weaker and more limited on the L.  Encouraged to be consistent with LE exercises on her own and she acknowledges that when pain improves she needs to continue trying more functional mobility tasks.  Pt will benefit from further PT, continue with POC.     If plan is discharge home, recommend the following: Two people to help with walking and/or transfers;A lot of help with bathing/dressing/bathroom;Assist for transportation;Help with stairs or ramp for entrance;Assistance with cooking/housework   Can travel by private vehicle     No  Equipment Recommendations  None recommended by PT    Recommendations for Other Services       Precautions / Restrictions Precautions Precautions: Fall Restrictions Weight Bearing Restrictions Per Provider Order: No     Mobility  Bed Mobility               General bed mobility comments: Pt reports that she is feeling too tired and having too much pain to try getting up/moving    Transfers                        Ambulation/Gait                    Stairs             Wheelchair Mobility     Tilt Bed    Modified Rankin (Stroke Patients Only)       Balance                                            Communication Communication Communication: No apparent difficulties  Cognition Arousal: Alert Behavior During Therapy: WFL for tasks assessed/performed                             Following commands: Intact      Cueing    Exercises General Exercises - Lower Extremity Ankle Circles/Pumps: AROM, 10 reps Quad Sets: Strengthening, 10 reps Short Arc Quad: Strengthening, 10 reps Heel Slides: Strengthening, 10 reps (with resisted leg ext) Hip ABduction/ADduction: Strengthening, 10 reps    General Comments        Pertinent Vitals/Pain Pain Assessment Pain Assessment: 0-10 Pain Score: 8  Pain Location: neck, shoulders Pain Intervention(s): Limited activity within patient's tolerance    Home Living                          Prior Function  PT Goals (current goals can now be found in the care plan section) Progress towards PT goals:  (slow progress)    Frequency    Min 2X/week      PT Plan      Co-evaluation              AM-PAC PT "6 Clicks" Mobility   Outcome Measure  Help needed turning from your back to your side while in a flat bed without using bedrails?: A Lot Help needed moving from lying on your back to sitting on the side of a flat bed without using bedrails?: A Lot Help needed moving to and from a bed to a chair (including a wheelchair)?: Total Help needed standing up from a chair using your arms (e.g., wheelchair or bedside chair)?: A Lot Help needed to walk in hospital room?: Total Help needed climbing 3-5 steps with a railing? : Total 6 Click Score: 9    End of Session   Activity Tolerance: Patient limited by pain;Patient limited by fatigue Patient left: in bed;with call bell/phone within reach;with bed alarm set;with  family/visitor present Nurse Communication: Mobility status PT Visit Diagnosis: Muscle weakness (generalized) (M62.81);Unsteadiness on feet (R26.81)     Time: 6295-2841 PT Time Calculation (min) (ACUTE ONLY): 16 min  Charges:    $Therapeutic Exercise: 8-22 mins PT General Charges $$ ACUTE PT VISIT: 1 Visit                     Malachi Pro, DPT 07/23/2023, 6:19 PM

## 2023-07-24 DIAGNOSIS — I6340 Cerebral infarction due to embolism of unspecified cerebral artery: Secondary | ICD-10-CM | POA: Diagnosis not present

## 2023-07-24 LAB — CBC
HCT: 37.6 % (ref 36.0–46.0)
Hemoglobin: 12.5 g/dL (ref 12.0–15.0)
MCH: 33.2 pg (ref 26.0–34.0)
MCHC: 33.2 g/dL (ref 30.0–36.0)
MCV: 99.7 fL (ref 80.0–100.0)
Platelets: 142 10*3/uL — ABNORMAL LOW (ref 150–400)
RBC: 3.77 MIL/uL — ABNORMAL LOW (ref 3.87–5.11)
RDW: 16.2 % — ABNORMAL HIGH (ref 11.5–15.5)
WBC: 8.1 10*3/uL (ref 4.0–10.5)
nRBC: 0 % (ref 0.0–0.2)

## 2023-07-24 LAB — GLUCOSE, CAPILLARY
Glucose-Capillary: 103 mg/dL — ABNORMAL HIGH (ref 70–99)
Glucose-Capillary: 86 mg/dL (ref 70–99)
Glucose-Capillary: 80 mg/dL (ref 70–99)
Glucose-Capillary: 90 mg/dL (ref 70–99)

## 2023-07-24 LAB — BASIC METABOLIC PANEL
Anion gap: 8 (ref 5–15)
BUN: 24 mg/dL — ABNORMAL HIGH (ref 8–23)
CO2: 22 mmol/L (ref 22–32)
Calcium: 9.9 mg/dL (ref 8.9–10.3)
Chloride: 98 mmol/L (ref 98–111)
Creatinine, Ser: 0.82 mg/dL (ref 0.44–1.00)
GFR, Estimated: >60 mL/min (ref >60)
Glucose, Bld: 120 mg/dL — ABNORMAL HIGH (ref 70–99)
Potassium: 4.5 mmol/L (ref 3.5–5.1)
Sodium: 128 mmol/L — ABNORMAL LOW (ref 135–145)

## 2023-07-24 LAB — URIC ACID: Uric Acid, Serum: 5.1 mg/dL (ref 2.5–7.1)

## 2023-07-24 MED ORDER — BISACODYL 10 MG RE SUPP
10.0000 mg | Freq: Once | RECTAL | Status: AC
  Administered 2023-07-24: 10 mg via RECTAL
  Filled 2023-07-24: qty 1

## 2023-07-24 NOTE — NC FL2 (Signed)
  MEDICAID FL2 LEVEL OF CARE FORM     IDENTIFICATION  Patient Name: Nina Perez Birthdate: Jun 21, 1940 Sex: female Admission Date (Current Location): 07/18/2023  [place] and [place] Number:  Chiropodist and Address:  [facility], [address], [place], [place] [address]      Provider Number: [id number]  Attending Physician Name and Address:  Gillis Santa, MD  Relative Name and Phone Number:  Julie-Ann, Vanmaanen (Spouse)  [phone] (Mobile)    Current Level of Care: Hospital Recommended Level of Care: Skilled Nursing Facility Prior Approval Number:    Date Approved/Denied:   PASRR Number: [id number]  Discharge Plan: SNF    Current Diagnoses: Patient Active Problem List   Diagnosis Date Noted   Right arm weakness 07/18/2023   Type II diabetes mellitus with renal manifestations (HCC) 07/18/2023   Myocardial injury 07/18/2023   PVD (peripheral vascular disease) (HCC) 07/18/2023   Chronic diastolic CHF (congestive heart failure) (HCC) 07/18/2023   Chronic kidney disease, stage 3a (HCC) 07/18/2023   IPF (idiopathic pulmonary fibrosis) (HCC) 07/18/2023   Fall at home, initial encounter 07/18/2023   Left arm weakness 07/18/2023   Stroke (HCC) 07/18/2023   Open wound of knee, left, subsequent encounter 06/07/2023   Hyponatremia 04/08/2023   History of non-ST elevation myocardial infarction (NSTEMI) 04/07/2023   Acute respiratory failure with hypoxia (HCC) 04/07/2023   Cellulitis of knee, left 04/07/2023   Acute kidney injury superimposed on CKD (HCC) 04/07/2023   Cellulitis of left knee 04/07/2023   UTI (urinary tract infection) 04/06/2023   Generalized weakness 04/06/2023   SOB (shortness of breath) on exertion 03/02/2023   Lower extremity cellulitis 03/02/2023   Left hip pain 03/02/2023   Positive self-administered antigen test for COVID-19 01/14/2023   CAD (coronary artery disease) 12/13/2022   Dyslipidemia 11/22/2022    Essential hypertension 11/22/2022   NSTEMI (non-ST elevated myocardial infarction) (HCC) 11/21/2022   Cardiomyopathy (HCC) 11/17/2022   Balance problem 11/17/2022   Anemia in chronic kidney disease 06/24/2022   Congestive heart failure (HCC) 06/24/2022   Idiopathic pulmonary fibrosis (HCC) 06/24/2022   Proteinuria, unspecified 06/24/2022   Ear lesion 04/20/2022   Change in bowel movement 04/20/2022   History of rectal bleeding 11/13/2021   Total knee replacement status 11/13/2021   Knee pain 08/21/2021   Occult blood positive stool 07/04/2021   Rectal bleeding 06/26/2021   Redness of skin 04/09/2021   Aortic atherosclerosis (HCC) 01/20/2021   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   Nocturia 12/18/2020   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) 10/08/2020   Abnormal ECG 08/06/2020   Premature beats 07/03/2020   Dysuria 12/11/2019   Adenomatous colon polyp 11/21/2019   Phlebitis 11/21/2019   Renal cyst 11/21/2019   Swelling of lower extremity 10/26/2019   Hypomagnesemia 06/03/2019   CKD (chronic kidney disease) stage 3, GFR 30-59 ml/min (HCC) 06/02/2019   Atherosclerosis of native arteries of extremity with rest pain (HCC) 05/29/2019   Critical lower limb ischemia (HCC) 05/16/2019   Open wound of left foot 03/27/2019   Decreased GFR 11/13/2018   Bladder incontinence 11/13/2018   Elevated TSH 11/13/2018   Thrombocytopenia (HCC) 07/03/2018   Acute right-sided thoracic back pain 06/16/2018   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   Pulmonary hypertension, mild (HCC) 03/04/2018   Murmur 08/16/2017   Lymphedema 03/04/2016   Lesion of neck 12/15/2015   Dizziness 12/15/2015   Venous stasis dermatitis of right lower extremity 09/10/2015   Varicose veins of right lower  extremity with inflammation 09/10/2015   Breast nodule 11/05/2014   Health care maintenance 11/02/2014   Obesity (BMI 30-39.9) 06/04/2014   Osteoarthritis 02/27/2014   Environmental allergies 01/15/2014   Type 2  diabetes mellitus with hyperlipidemia (HCC) 05/22/2012   Hypertension 05/22/2012   Hypercholesterolemia 05/22/2012   Rheumatoid arthritis (HCC) 05/22/2012   Barrett's esophagus 05/22/2012   Gout 05/22/2012   GERD (gastroesophageal reflux disease) 05/22/2012   History of colon polyps 05/22/2012   Type 2 diabetes mellitus without complications (HCC) 05/22/2012    Orientation RESPIRATION BLADDER Height & Weight     Self, Time, Situation, Place  Normal Incontinent Weight: 198 lb 10.2 oz (90.1 kg) Height:  5\' 6"  (167.6 cm)  BEHAVIORAL SYMPTOMS/MOOD NEUROLOGICAL BOWEL NUTRITION STATUS      Continent Diet (see d/c summary)  AMBULATORY STATUS COMMUNICATION OF NEEDS Skin   Extensive Assist Verbally Normal                       Personal Care Assistance Level of Assistance  Bathing, Feeding, Dressing Bathing Assistance: Maximum assistance Feeding assistance: Independent Dressing Assistance: Limited assistance     Functional Limitations Info  Sight, Hearing, Speech Sight Info: Adequate Hearing Info: Adequate Speech Info: Adequate    SPECIAL CARE FACTORS FREQUENCY  OT (By licensed OT), PT (By licensed PT)     PT Frequency: 5x/week OT Frequency: 5x/week            Contractures Contractures Info: Not present    Additional Factors Info  Code Status, Allergies Code Status Info: full code Allergies Info: Triamterene-hctz, ethanol, tape, Cephalexin, Hydrochlorothiazide W-triamterene           Current Medications (07/24/2023):  This is the current hospital active medication list Current Facility-Administered Medications  Medication Dose Route Frequency Provider Last Rate Last Admin   acetaminophen (TYLENOL) tablet 650 mg  650 mg Oral Q4H PRN Lorretta Harp, MD   650 mg at 07/24/23 8295   Or   acetaminophen (TYLENOL) 160 MG/5ML solution 650 mg  650 mg Per Tube Q4H PRN Lorretta Harp, MD       Or   acetaminophen (TYLENOL) suppository 650 mg  650 mg Rectal Q4H PRN Lorretta Harp, MD        albuterol (PROVENTIL) (2.5 MG/3ML) 0.083% nebulizer solution 3 mL  3 mL Inhalation Q4H PRN Lorretta Harp, MD       allopurinol (ZYLOPRIM) tablet 100 mg  100 mg Oral Daily Lorretta Harp, MD   100 mg at 07/24/23 0818   amLODipine (NORVASC) tablet 5 mg  5 mg Oral Daily Gillis Santa, MD   5 mg at 07/24/23 0818   aspirin EC tablet 81 mg  81 mg Oral Daily Lorretta Harp, MD   81 mg at 07/24/23 0817   atorvastatin (LIPITOR) tablet 40 mg  40 mg Oral Daily Lorretta Harp, MD   40 mg at 07/24/23 0818   bisacodyl (DULCOLAX) EC tablet 10 mg  10 mg Oral QHS Gillis Santa, MD   10 mg at 07/23/23 2019   bisacodyl (DULCOLAX) suppository 10 mg  10 mg Rectal Daily PRN Gillis Santa, MD       clopidogrel (PLAVIX) tablet 75 mg  75 mg Oral Q breakfast Lorretta Harp, MD   75 mg at 07/24/23 0818   dextromethorphan-guaiFENesin (MUCINEX DM) 30-600 MG per 12 hr tablet 1 tablet  1 tablet Oral BID PRN Lorretta Harp, MD       enoxaparin (LOVENOX) injection 40 mg  40  mg Subcutaneous Q24H Lorretta Harp, MD   40 mg at 07/23/23 2018   folic acid (FOLVITE) tablet 1 mg  1 mg Oral QPM Lorretta Harp, MD   1 mg at 07/23/23 1723   hydrALAZINE (APRESOLINE) injection 5 mg  5 mg Intravenous Q2H PRN Lorretta Harp, MD       insulin aspart (novoLOG) injection 0-5 Units  0-5 Units Subcutaneous QHS Lorretta Harp, MD       insulin aspart (novoLOG) injection 0-9 Units  0-9 Units Subcutaneous TID WC Lorretta Harp, MD   1 Units at 07/23/23 1722   loratadine (CLARITIN) tablet 10 mg  10 mg Oral Daily PRN Lorretta Harp, MD       magnesium oxide (MAG-OX) tablet 400 mg  400 mg Oral BID Gillis Santa, MD   400 mg at 07/24/23 0818   methocarbamol (ROBAXIN) tablet 500 mg  500 mg Oral Q8H PRN Lorretta Harp, MD   500 mg at 07/23/23 2019   methotrexate (RHEUMATREX) tablet 10 mg  10 mg Oral Q Mervin Kung, Brien Few, MD   10 mg at 07/22/23 1159   metoprolol succinate (TOPROL-XL) 24 hr tablet 25 mg  25 mg Oral Daily Gillis Santa, MD   25 mg at 07/24/23 0818   nitroGLYCERIN (NITROSTAT) SL tablet 0.4 mg   0.4 mg Sublingual Q5 Min x 3 PRN Lorretta Harp, MD       ondansetron [facility]) injection 4 mg  4 mg Intravenous Q8H PRN Lorretta Harp, MD       oxyCODONE (Oxy IR/ROXICODONE) immediate release tablet 2.5 mg  2.5 mg Oral Q6H PRN Gillis Santa, MD   2.5 mg at 07/24/23 0817   polyethylene glycol (MIRALAX / GLYCOLAX) packet 17 g  17 g Oral BID Gillis Santa, MD   17 g at 07/24/23 0818   senna-docusate (Senokot-S) tablet 1 tablet  1 tablet Oral QHS PRN Lorretta Harp, MD       sorbitol, magnesium hydroxide, mineral oil, glycerin (SMOG) enema  960 mL Rectal Once Gillis Santa, MD         Discharge Medications: Please see discharge summary for a list of discharge medications.  Relevant Imaging Results:  Relevant Lab Results:   Additional Information SSN:[id number]  Shrey Boike Aris Lot, LCSW

## 2023-07-24 NOTE — Progress Notes (Signed)
 Progress Note   Patient: Nina Perez ZOX:[id number] DOB: Jan 20, 1941 DOA: 07/18/2023     6 DOS: the patient was seen and examined on 07/24/2023   Brief hospital course: 83 y.o. female with medical history significant of HTN, HLD, DM, PVD, CAD with stent, dCHF, gout, RA, scleroderma, mild pulmonary fibrosis, pulmonary hypertension, left bundle blockage, CKD 3A, who presents with fall and weakness, urinary frequency.  Subsequently found to have BL CVAs.  Assessment and Plan:  Acute bilateral CVA - Likely contributing etiology to patient's weakness.   MRI noting punctate acute infarcts in bilateral frontal/parietal lobes and cerebellum.  Obvious concern for embolic etiology.  CT angio unremarkable.  Echo LVEF 55 to 60%, moderate LV hypertrophy, grade 1 diastolic dysfunction LDL 32, below goal 70, A1c 6.1, below goal 7%, TSH 4.1 WNL Neuro consulted, recommended to continue DAPT due to recent cardiac stent, continue statin.  30 day Cardiac monitor on discharge Informed to [facility] [place] for cardiac monitor PT/OT evaluation for possible SNF placement Follow TOC for discharge planning Follow MRI C-spine due to persistent weakness  Cervical stenosis MRI C-spine: Mild spinal canal stenosis at C4-5 and C5-6. Severe left C4-5 and C5-6 neural foraminal stenosis.  Neurology recommended that neuroforaminal stenosis could be contributing to some of her left upper extremity weakness, given pain and effort limitations on exam would need EMG/nerve conduction study to confirm which could be completed outpatient if desired by patient/family.    Pyuria, UTI ruled out Patient denied any dysuria, no UTI symptoms urine culture growing multiple species, suggestive reticulation  WBC within normal range S/p ceftriaxone and Levaquin DC'd on 3/10 Monitor off antibiotics for now  CAD s/p cardiac stent, slightly elevated troponin resumed DAPT with statin medications.   No chest pain, shortness of breath to  suggest ACS.  Diabetes mellitus - Insulin sliding scale on board.  Hypertension - Mildly hypotensive on presentation.  Holding antihypertensives at this time.  Chronic HFpEF - Does not appear to be in acute exacerbation.  Will restart cardiac regiment as blood pressure allows.  Peripheral vascular disease - Aspirin, Plavix, Lipitor on board.  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  Nebulizers as needed.  Hypomagnesemia, mag repleted. Monitor electrolytes  Obesity, class I BMI 31.7 Encouraged weight loss, cardiac/diabetic diet.  Gout - Does not appear to be acutely exacerbated.  Allopurinol on board. Check Uric acid level  Constipation, started laxatives.  Suppository x 1 given on 3/12, Use enema if no BM  Pt had a BM on 3/12 3/14 Dulcolax suppository x 1 dose  DVT prophylaxis: Lovenox    Active Pressure Injury/Wound(s)     Pressure Ulcer  Duration          Pressure Injury 04/11/23 Heel Left Stage 1 -  Intact skin with non-blanchable redness of a localized area usually over a bony prominence. 100 days            Subjective: No significant events overnight, slept well last night, pain was under control but again she is having pain in the shoulders, advised continue prn pain meds and heat pad. Denies any other complaints    Physical Exam: Vitals:   07/24/23 0331 07/24/23 0423 07/24/23 0730 07/24/23 1131  BP:   (!) 116/57 (!) 120/56  Pulse:   82 77  Resp:   16 19  Temp:  99.3 F (37.4 C) 98.4 F (36.9 C) 98.1 F (36.7 C)  TempSrc:  Oral    SpO2: 94%  91% 96%  Weight: 90.1 kg     Height:       GENERAL:  Alert, pleasant, no acute distress  HEENT:  EOMI CARDIOVASCULAR:  RRR, no murmurs appreciated RESPIRATORY:  Clear to auscultation, no wheezing, rales, or rhonchi GASTROINTESTINAL:  Soft, nontender, nondistended EXTREMITIES:  No LE edema bilaterally NEURO: CN II through XII intact, no  extinction, sensation equal and intact in BL upper and lower extremities, strength 5/5 BL UE with very slight LUE weakness appreciated, slight ataxia appreciated on LUE finger-to-nose, BL LE hip flexion 4/5 worse on the left, LLE foot flexion and extension 4/5, unable to perform heel-to-shin secondary to weakness. SKIN:  No rashes noted PSYCH:  Appropriate mood and affect   Data Reviewed:  There are no new results to review at this time.  Family Communication: Husband at bedside  Disposition: Status is: Inpatient Remains inpatient appropriate because: Acute CVA  Planned Discharge Destination: Skilled nursing facility    Patient is stable to discharge to SNF when bed will be available.  Time spent: 35 minutes  Author: Gillis Santa, MD 07/24/2023 2:01 PM  For on call review [email or web address].

## 2023-07-24 NOTE — TOC Initial Note (Signed)
 Transition of Care [facility]) - Initial/Assessment Note    Patient Details  Name: Nina Perez MRN: [id number] Date of Birth: 02-12-1941  [facility] CM/SW Contact:    Erin Sons, LCSW Phone Number: 07/24/2023, 2:21 PM  Clinical Narrative:                  CSW met with pt and pt's spouse bedside to discuss PT recommendation for SNF. Pt and spouse agreeable to SNF. Pt has been to [facility] in the past. Spouse has spoken to [facility] there who is expecting referral. Fl2 completed and referral sent in hub.   CSW left voicemail with Clide Cliff requesting return call.  Expected Discharge Plan: Skilled Nursing Facility Barriers to Discharge: SNF Pending bed offer   Patient Goals and CMS Choice            Expected Discharge Plan and Services                                              Prior Living Arrangements/Services     Patient language and need for interpreter reviewed:: Yes        Need for Family Participation in Patient Care: Yes (Comment) Care giver support system in place?: Yes (comment)   Criminal Activity/Legal Involvement Pertinent to Current Situation/Hospitalization: No - Comment as needed  Activities of Daily Living   ADL Screening (condition at time of admission) Independently performs ADLs?: No Does the patient have a NEW difficulty with bathing/dressing/toileting/self-feeding that is expected to last >3 days?: Yes (Initiates electronic notice to provider for possible OT consult) Does the patient have a NEW difficulty with getting in/out of bed, walking, or climbing stairs that is expected to last >3 days?: Yes (Initiates electronic notice to provider for possible PT consult) Does the patient have a NEW difficulty with communication that is expected to last >3 days?: No Is the patient deaf or have difficulty hearing?: No Does the patient have difficulty seeing, even when wearing glasses/contacts?: No Does the patient have difficulty  concentrating, remembering, or making decisions?: No  Permission Sought/Granted   Permission granted to share information with : Yes, Verbal Permission Granted  Share Information with NAME: Cornelious, Diven (Spouse)  [phone] (Mobile)           Emotional Assessment Appearance:: Appears stated age Attitude/Demeanor/Rapport: Lethargic Affect (typically observed): Accepting Orientation: : Oriented to Self, Oriented to Place, Oriented to  Time, Oriented to Situation Alcohol / Substance Use: Not Applicable Psych Involvement: No (comment)  Admission diagnosis:  Right arm weakness [R29.898] Left arm weakness [R29.898] Closed right hip fracture (HCC) [S72.001A] Fall, initial encounter [W19.XXXA] Patient Active Problem List   Diagnosis Date Noted   Right arm weakness 07/18/2023   Type II diabetes mellitus with renal manifestations (HCC) 07/18/2023   Myocardial injury 07/18/2023   PVD (peripheral vascular disease) (HCC) 07/18/2023   Chronic diastolic CHF (congestive heart failure) (HCC) 07/18/2023   Chronic kidney disease, stage 3a (HCC) 07/18/2023   IPF (idiopathic pulmonary fibrosis) (HCC) 07/18/2023   Fall at home, initial encounter 07/18/2023   Left arm weakness 07/18/2023   Stroke (HCC) 07/18/2023   Open wound of knee, left, subsequent encounter 06/07/2023   Hyponatremia 04/08/2023   History of non-ST elevation myocardial infarction (NSTEMI) 04/07/2023   Acute respiratory failure with hypoxia (HCC) 04/07/2023   Cellulitis of knee, left 04/07/2023  Acute kidney injury superimposed on CKD (HCC) 04/07/2023   Cellulitis of left knee 04/07/2023   UTI (urinary tract infection) 04/06/2023   Generalized weakness 04/06/2023   SOB (shortness of breath) on exertion 03/02/2023   Lower extremity cellulitis 03/02/2023   Left hip pain 03/02/2023   Positive self-administered antigen test for COVID-19 01/14/2023   CAD (coronary artery disease) 12/13/2022   Dyslipidemia 11/22/2022    Essential hypertension 11/22/2022   NSTEMI (non-ST elevated myocardial infarction) (HCC) 11/21/2022   Cardiomyopathy (HCC) 11/17/2022   Balance problem 11/17/2022   Anemia in chronic kidney disease 06/24/2022   Congestive heart failure (HCC) 06/24/2022   Idiopathic pulmonary fibrosis (HCC) 06/24/2022   Proteinuria, unspecified 06/24/2022   Ear lesion 04/20/2022   Change in bowel movement 04/20/2022   History of rectal bleeding 11/13/2021   Total knee replacement status 11/13/2021   Knee pain 08/21/2021   Occult blood positive stool 07/04/2021   Rectal bleeding 06/26/2021   Redness of skin 04/09/2021   Aortic atherosclerosis (HCC) 01/20/2021   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   Nocturia 12/18/2020   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) 10/08/2020   Abnormal ECG 08/06/2020   Premature beats 07/03/2020   Dysuria 12/11/2019   Adenomatous colon polyp 11/21/2019   Phlebitis 11/21/2019   Renal cyst 11/21/2019   Swelling of lower extremity 10/26/2019   Hypomagnesemia 06/03/2019   CKD (chronic kidney disease) stage 3, GFR 30-59 ml/min (HCC) 06/02/2019   Atherosclerosis of native arteries of extremity with rest pain (HCC) 05/29/2019   Critical lower limb ischemia (HCC) 05/16/2019   Open wound of left foot 03/27/2019   Decreased GFR 11/13/2018   Bladder incontinence 11/13/2018   Elevated TSH 11/13/2018   Thrombocytopenia (HCC) 07/03/2018   Acute right-sided thoracic back pain 06/16/2018   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   Pulmonary hypertension, mild (HCC) 03/04/2018   Murmur 08/16/2017   Lymphedema 03/04/2016   Lesion of neck 12/15/2015   Dizziness 12/15/2015   Venous stasis dermatitis of right lower extremity 09/10/2015   Varicose veins of right lower extremity with inflammation 09/10/2015   Breast nodule 11/05/2014   Health care maintenance 11/02/2014   Obesity (BMI 30-39.9) 06/04/2014   Osteoarthritis 02/27/2014   Environmental allergies 01/15/2014   Type 2  diabetes mellitus with hyperlipidemia (HCC) 05/22/2012   Hypertension 05/22/2012   Hypercholesterolemia 05/22/2012   Rheumatoid arthritis (HCC) 05/22/2012   Barrett's esophagus 05/22/2012   Gout 05/22/2012   GERD (gastroesophageal reflux disease) 05/22/2012   History of colon polyps 05/22/2012   Type 2 diabetes mellitus without complications (HCC) 05/22/2012   PCP:  Dale Cantrall, MD Pharmacy:   [facility] [address] [place] - [address] [place] [address] Phone: [phone] Fax: [phone]  [facility] - [place], [place] - [address] [place] [address] Phone: [phone] Fax: [phone]  [facility] [address] [place] [address] Phone: [phone] Fax: [phone]     Social Drivers of Health (SDOH) Social History: SDOH Screenings   Food Insecurity: No Food Insecurity (07/20/2023)  Housing: Low Risk  (07/20/2023)  Transportation Needs: No Transportation Needs (07/20/2023)  Utilities: Not At Risk (07/20/2023)  Alcohol Screen: Low Risk  (01/15/2023)  Depression (PHQ2-9): Low Risk  (02/04/2023)  Financial Resource Strain: Low Risk  (01/15/2023)  Physical Activity: Unknown (01/15/2023)  Social Connections: Moderately Integrated (07/20/2023)  Stress: No Stress Concern Present (01/15/2023)  Tobacco Use: Medium  Risk (07/20/2023)  Health Literacy: Adequate Health Literacy (01/15/2023)   SDOH Interventions:     Readmission Risk Interventions    04/08/2023    2:23 PM  Readmission Risk Prevention Plan  Transportation Screening Complete  PCP or Specialist Appt within 3-5 Days Complete  HRI or Home Care Consult Complete  Medication Review (RN Care Manager) Complete

## 2023-07-24 NOTE — Plan of Care (Signed)
  Problem: Coping: Goal: Will identify appropriate support needs Outcome: Progressing   Problem: Self-Care: Goal: Verbalization of feelings and concerns over difficulty with self-care will improve Outcome: Progressing Goal: Ability to communicate needs accurately will improve Outcome: Progressing   Problem: Coping: Goal: Will identify appropriate support needs Outcome: Progressing   Problem: Self-Care: Goal: Verbalization of feelings and concerns over difficulty with self-care will improve Outcome: Progressing

## 2023-07-25 DIAGNOSIS — I6340 Cerebral infarction due to embolism of unspecified cerebral artery: Secondary | ICD-10-CM | POA: Diagnosis not present

## 2023-07-25 LAB — BASIC METABOLIC PANEL
Anion gap: 4 — ABNORMAL LOW (ref 5–15)
BUN: 26 mg/dL — ABNORMAL HIGH (ref 8–23)
CO2: 24 mmol/L (ref 22–32)
Calcium: 10 mg/dL (ref 8.9–10.3)
Chloride: 100 mmol/L (ref 98–111)
Creatinine, Ser: 0.77 mg/dL (ref 0.44–1.00)
GFR, Estimated: >60 mL/min (ref >60)
Glucose, Bld: 84 mg/dL (ref 70–99)
Potassium: 4.3 mmol/L (ref 3.5–5.1)
Sodium: 128 mmol/L — ABNORMAL LOW (ref 135–145)

## 2023-07-25 LAB — CBC
HCT: 36.1 % (ref 36.0–46.0)
Hemoglobin: 11.8 g/dL — ABNORMAL LOW (ref 12.0–15.0)
MCH: 32.5 pg (ref 26.0–34.0)
MCHC: 32.7 g/dL (ref 30.0–36.0)
MCV: 99.4 fL (ref 80.0–100.0)
Platelets: 150 10*3/uL (ref 150–400)
RBC: 3.63 MIL/uL — ABNORMAL LOW (ref 3.87–5.11)
RDW: 15.9 % — ABNORMAL HIGH (ref 11.5–15.5)
WBC: 5 10*3/uL (ref 4.0–10.5)
nRBC: 0 % (ref 0.0–0.2)

## 2023-07-25 LAB — GLUCOSE, CAPILLARY
Glucose-Capillary: 87 mg/dL (ref 70–99)
Glucose-Capillary: 142 mg/dL — ABNORMAL HIGH (ref 70–99)
Glucose-Capillary: 95 mg/dL (ref 70–99)
Glucose-Capillary: 91 mg/dL (ref 70–99)

## 2023-07-25 LAB — OSMOLALITY: Osmolality: 281 mosm/kg (ref 275–295)

## 2023-07-25 MED ORDER — SODIUM CHLORIDE 1 G PO TABS
2.0000 g | ORAL_TABLET | Freq: Three times a day (TID) | ORAL | Status: AC
  Administered 2023-07-25 (×2): 2 g via ORAL
  Filled 2023-07-25 (×2): qty 2

## 2023-07-25 MED ORDER — SODIUM CHLORIDE 1 G PO TABS
1.0000 g | ORAL_TABLET | Freq: Three times a day (TID) | ORAL | Status: DC
Start: 2023-07-26 — End: 2023-07-29
  Administered 2023-07-26 – 2023-07-29 (×8): 1 g via ORAL
  Filled 2023-07-25 (×8): qty 1

## 2023-07-25 NOTE — Plan of Care (Signed)
  Problem: Education: Goal: Ability to describe self-care measures that may prevent or decrease complications (Diabetes Survival Skills Education) will improve Outcome: Progressing Goal: Individualized Educational Video(s) Outcome: Progressing   Problem: Coping: Goal: Ability to adjust to condition or change in health will improve Outcome: Progressing   Problem: Fluid Volume: Goal: Ability to maintain a balanced intake and output will improve Outcome: Progressing   Problem: Health Behavior/Discharge Planning: Goal: Ability to identify and utilize available resources and services will improve Outcome: Progressing Goal: Ability to manage health-related needs will improve Outcome: Progressing   Problem: Metabolic: Goal: Ability to maintain appropriate glucose levels will improve Outcome: Progressing   Problem: Skin Integrity: Goal: Risk for impaired skin integrity will decrease Outcome: Progressing   Problem: Tissue Perfusion: Goal: Adequacy of tissue perfusion will improve Outcome: Progressing

## 2023-07-25 NOTE — Plan of Care (Signed)
 Problem: Education: Goal: Ability to describe self-care measures that may prevent or decrease complications (Diabetes Survival Skills Education) will improve Outcome: Progressing Goal: Individualized Educational Video(s) Outcome: Progressing   Problem: Coping: Goal: Ability to adjust to condition or change in health will improve Outcome: Progressing   Problem: Fluid Volume: Goal: Ability to maintain a balanced intake and output will improve Outcome: Progressing   Problem: Health Behavior/Discharge Planning: Goal: Ability to identify and utilize available resources and services will improve Outcome: Progressing Goal: Ability to manage health-related needs will improve Outcome: Progressing   Problem: Metabolic: Goal: Ability to maintain appropriate glucose levels will improve Outcome: Progressing   Problem: Nutritional: Goal: Maintenance of adequate nutrition will improve Outcome: Progressing Goal: Progress toward achieving an optimal weight will improve Outcome: Progressing   Problem: Skin Integrity: Goal: Risk for impaired skin integrity will decrease Outcome: Progressing   Problem: Tissue Perfusion: Goal: Adequacy of tissue perfusion will improve Outcome: Progressing   Problem: Education: Goal: Knowledge of disease or condition will improve Outcome: Progressing Goal: Knowledge of secondary prevention will improve (MUST DOCUMENT ALL) Outcome: Progressing Goal: Knowledge of patient specific risk factors will improve (DELETE if not current risk factor) Outcome: Progressing   Problem: Ischemic Stroke/TIA Tissue Perfusion: Goal: Complications of ischemic stroke/TIA will be minimized Outcome: Progressing   Problem: Coping: Goal: Will verbalize positive feelings about self Outcome: Progressing Goal: Will identify appropriate support needs Outcome: Progressing   Problem: Health Behavior/Discharge Planning: Goal: Ability to manage health-related needs will  improve Outcome: Progressing Goal: Goals will be collaboratively established with patient/family Outcome: Progressing   Problem: Self-Care: Goal: Ability to participate in self-care as condition permits will improve Outcome: Progressing Goal: Verbalization of feelings and concerns over difficulty with self-care will improve Outcome: Progressing Goal: Ability to communicate needs accurately will improve Outcome: Progressing   Problem: Nutrition: Goal: Risk of aspiration will decrease Outcome: Progressing Goal: Dietary intake will improve Outcome: Progressing   Problem: Education: Goal: Knowledge of disease or condition will improve Outcome: Progressing Goal: Knowledge of secondary prevention will improve (MUST DOCUMENT ALL) Outcome: Progressing Goal: Knowledge of patient specific risk factors will improve (DELETE if not current risk factor) Outcome: Progressing   Problem: Ischemic Stroke/TIA Tissue Perfusion: Goal: Complications of ischemic stroke/TIA will be minimized Outcome: Progressing   Problem: Coping: Goal: Will verbalize positive feelings about self Outcome: Progressing Goal: Will identify appropriate support needs Outcome: Progressing   Problem: Health Behavior/Discharge Planning: Goal: Ability to manage health-related needs will improve Outcome: Progressing Goal: Goals will be collaboratively established with patient/family Outcome: Progressing   Problem: Self-Care: Goal: Ability to participate in self-care as condition permits will improve Outcome: Progressing Goal: Verbalization of feelings and concerns over difficulty with self-care will improve Outcome: Progressing Goal: Ability to communicate needs accurately will improve Outcome: Progressing   Problem: Nutrition: Goal: Risk of aspiration will decrease Outcome: Progressing Goal: Dietary intake will improve Outcome: Progressing   Problem: Education: Goal: Knowledge of General Education  information will improve Description: Including pain rating scale, medication(s)/side effects and non-pharmacologic comfort measures Outcome: Progressing   Problem: Health Behavior/Discharge Planning: Goal: Ability to manage health-related needs will improve Outcome: Progressing   Problem: Clinical Measurements: Goal: Ability to maintain clinical measurements within normal limits will improve Outcome: Progressing Goal: Will remain free from infection Outcome: Progressing Goal: Diagnostic test results will improve Outcome: Progressing Goal: Respiratory complications will improve Outcome: Progressing Goal: Cardiovascular complication will be avoided Outcome: Progressing   Problem: Activity: Goal: Risk for activity intolerance will decrease Outcome:  Progressing   Problem: Nutrition: Goal: Adequate nutrition will be maintained Outcome: Progressing   Problem: Coping: Goal: Level of anxiety will decrease Outcome: Progressing   Problem: Elimination: Goal: Will not experience complications related to bowel motility Outcome: Progressing Goal: Will not experience complications related to urinary retention Outcome: Progressing

## 2023-07-25 NOTE — Progress Notes (Signed)
 Progress Note   Patient: Nina Perez JYN:[id number] DOB: 11-Jan-1941 DOA: 07/18/2023     7 DOS: the patient was seen and examined on 07/25/2023   Brief hospital course: 83 y.o. female with medical history significant of HTN, HLD, DM, PVD, CAD with stent, dCHF, gout, RA, scleroderma, mild pulmonary fibrosis, pulmonary hypertension, left bundle blockage, CKD 3A, who presents with fall and weakness, urinary frequency.  Subsequently found to have BL CVAs.  Assessment and Plan:  Acute bilateral CVA - Likely contributing etiology to patient's weakness.   MRI noting punctate acute infarcts in bilateral frontal/parietal lobes and cerebellum.  Obvious concern for embolic etiology.  CT angio unremarkable.  Echo LVEF 55 to 60%, moderate LV hypertrophy, grade 1 diastolic dysfunction LDL 32, below goal 70, A1c 6.1, below goal 7%, TSH 4.1 WNL Neuro consulted, recommended to continue DAPT due to recent cardiac stent, continue statin.  30 day Cardiac monitor on discharge Informed to [facility] [place] for cardiac monitor PT/OT evaluation for possible SNF placement Follow TOC for discharge planning Follow MRI C-spine due to persistent weakness  Cervical stenosis MRI C-spine: Mild spinal canal stenosis at C4-5 and C5-6. Severe left C4-5 and C5-6 neural foraminal stenosis.  Neurology recommended that neuroforaminal stenosis could be contributing to some of her left upper extremity weakness, given pain and effort limitations on exam would need EMG/nerve conduction study to confirm which could be completed outpatient if desired by patient/family.  3/15 sent a text to neurosurgery to get an opinion  Pyuria, UTI ruled out Patient denied any dysuria, no UTI symptoms urine culture growing multiple species, suggestive reticulation  WBC within normal range S/p ceftriaxone and Levaquin DC'd on 3/10 Monitor off antibiotics for now  CAD s/p cardiac stent, slightly elevated troponin resumed DAPT with statin  medications.   No chest pain, shortness of breath to suggest ACS.  Diabetes mellitus - Insulin sliding scale on board.  Hypertension - Mildly hypotensive on presentation.  Holding antihypertensives at this time.  Chronic HFpEF - Does not appear to be in acute exacerbation.  Will restart cardiac regiment as blood pressure allows.  Peripheral vascular disease - Aspirin, Plavix, Lipitor on board.  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  Nebulizers as needed.  Hypomagnesemia, mag repleted. Monitor electrolytes  Obesity, class I BMI 31.7 Encouraged weight loss, cardiac/diabetic diet.  Gout - Does not appear to be acutely exacerbated.  Allopurinol on board. Check Uric acid level  Constipation, started laxatives.  Suppository x 1 given on 3/12, Use enema if no BM  3/14 Dulcolax suppository x 1 dose Pt had a BM on 3/14  DVT prophylaxis: Lovenox [place]   Active Pressure Injury/Wound(s)     Pressure Ulcer  Duration          Pressure Injury 04/11/23 Heel Left Stage 1 -  Intact skin with non-blanchable redness of a localized area usually over a bony prominence. 100 days            Subjective: No significant events overnight, she slept well last night without anything, in the morning time she had pain in the left side of neck, no radiation, still has weakness in the left. Patient denied any other complaints.   Physical Exam: Vitals:   07/24/23 2129 07/24/23 2321 07/25/23 0321 07/25/23 0721  BP: 133/60 (!) 126/57 125/71 126/62  Pulse:  75 80 67  Resp:  18 19 15   Temp: 98.7 F (37.1 C) 99 F (37.2 C) 98.9 F (37.2 C)  98 F (36.7 C)  TempSrc: Oral   Oral  SpO2: 95% 93% 91% 93%  Weight:      Height:       GENERAL:  Alert, pleasant, no acute distress  HEENT:  EOMI CARDIOVASCULAR:  RRR, no murmurs appreciated RESPIRATORY:  Clear to auscultation, no wheezing, rales, or rhonchi GASTROINTESTINAL:  Soft,  nontender, nondistended EXTREMITIES:  No LE edema bilaterally NEURO: CN II through XII grossly intact, left arm weak power 4/5, no any other focal deficits SKIN:  No rashes noted PSYCH:  Appropriate mood and affect   Data Reviewed:  There are no new results to review at this time.  Family Communication: Husband at bedside  Disposition: Status is: Inpatient Remains inpatient appropriate because: Acute CVA  Planned Discharge Destination: Skilled nursing facility    Patient is stable to discharge to SNF when bed will be available.  Time spent: 35 minutes  Author: Gillis Santa, MD 07/25/2023 12:28 PM  For on call review [email or web address].

## 2023-07-25 NOTE — Congregational Nurse Program (Signed)
 Patient is sleeping soundly. Scheduled NIH assessment could not be completed.

## 2023-07-26 DIAGNOSIS — W19XXXA Unspecified fall, initial encounter: Secondary | ICD-10-CM

## 2023-07-26 DIAGNOSIS — M4722 Other spondylosis with radiculopathy, cervical region: Secondary | ICD-10-CM | POA: Diagnosis not present

## 2023-07-26 DIAGNOSIS — I6340 Cerebral infarction due to embolism of unspecified cerebral artery: Secondary | ICD-10-CM | POA: Diagnosis not present

## 2023-07-26 DIAGNOSIS — M47812 Spondylosis without myelopathy or radiculopathy, cervical region: Secondary | ICD-10-CM

## 2023-07-26 DIAGNOSIS — R29898 Other symptoms and signs involving the musculoskeletal system: Secondary | ICD-10-CM | POA: Diagnosis not present

## 2023-07-26 LAB — PHOSPHORUS: Phosphorus: 2.7 mg/dL (ref 2.5–4.6)

## 2023-07-26 LAB — CBC
HCT: 37.7 % (ref 36.0–46.0)
Hemoglobin: 12.4 g/dL (ref 12.0–15.0)
MCH: 33.8 pg (ref 26.0–34.0)
MCHC: 32.9 g/dL (ref 30.0–36.0)
MCV: 102.7 fL — ABNORMAL HIGH (ref 80.0–100.0)
Platelets: 203 10*3/uL (ref 150–400)
RBC: 3.67 MIL/uL — ABNORMAL LOW (ref 3.87–5.11)
RDW: 16 % — ABNORMAL HIGH (ref 11.5–15.5)
WBC: 4.3 10*3/uL (ref 4.0–10.5)
nRBC: 0 % (ref 0.0–0.2)

## 2023-07-26 LAB — BASIC METABOLIC PANEL
Anion gap: 5 (ref 5–15)
BUN: 34 mg/dL — ABNORMAL HIGH (ref 8–23)
CO2: 24 mmol/L (ref 22–32)
Calcium: 10.1 mg/dL (ref 8.9–10.3)
Chloride: 101 mmol/L (ref 98–111)
Creatinine, Ser: 0.89 mg/dL (ref 0.44–1.00)
GFR, Estimated: >60 mL/min (ref >60)
Glucose, Bld: 103 mg/dL — ABNORMAL HIGH (ref 70–99)
Potassium: 4.4 mmol/L (ref 3.5–5.1)
Sodium: 130 mmol/L — ABNORMAL LOW (ref 135–145)

## 2023-07-26 LAB — GLUCOSE, CAPILLARY
Glucose-Capillary: 119 mg/dL — ABNORMAL HIGH (ref 70–99)
Glucose-Capillary: 124 mg/dL — ABNORMAL HIGH (ref 70–99)
Glucose-Capillary: 104 mg/dL — ABNORMAL HIGH (ref 70–99)
Glucose-Capillary: 96 mg/dL (ref 70–99)

## 2023-07-26 LAB — MAGNESIUM: Magnesium: 2.3 mg/dL (ref 1.7–2.4)

## 2023-07-26 MED ORDER — BISACODYL 10 MG RE SUPP
10.0000 mg | Freq: Once | RECTAL | Status: AC
  Administered 2023-07-26: 10 mg via RECTAL
  Filled 2023-07-26: qty 1

## 2023-07-26 NOTE — TOC Progression Note (Signed)
 Transition of Care [facility]) - Progression Note    Patient Details  Name: Nina Perez MRN: [id number] Date of Birth: Dec 13, 1940  [facility] CM/SW Contact  Bing Quarry, RN Phone Number: 07/26/2023, 1:31 PM  Clinical Narrative: 3/16: Prior CSW sent referral to [facility] on 3/14. Still Pending in hub.    Gabriel Cirri MSN RN CM  RN Case Manager [place]  Transitions of Care Direct Dial: [phone] (Weekends Only) [facility] Main Office Phone: [phone] [facility] Fax: [phone] [email or web address]     Expected Discharge Plan: Skilled Nursing Facility Barriers to Discharge: SNF Pending bed offer  Expected Discharge Plan and Services                                               Social Determinants of Health (SDOH) Interventions SDOH Screenings   Food Insecurity: No Food Insecurity (07/20/2023)  Housing: Low Risk  (07/20/2023)  Transportation Needs: No Transportation Needs (07/20/2023)  Utilities: Not At Risk (07/20/2023)  Alcohol Screen: Low Risk  (01/15/2023)  Depression (PHQ2-9): Low Risk  (02/04/2023)  Financial Resource Strain: Low Risk  (01/15/2023)  Physical Activity: Unknown (01/15/2023)  Social Connections: Moderately Integrated (07/20/2023)  Stress: No Stress Concern Present (01/15/2023)  Tobacco Use: Medium Risk (07/20/2023)  Health Literacy: Adequate Health Literacy (01/15/2023)    Readmission Risk Interventions    04/08/2023    2:23 PM  Readmission Risk Prevention Plan  Transportation Screening Complete  PCP or Specialist Appt within 3-5 Days Complete  HRI or Home Care Consult Complete  Medication Review (RN Care Manager) Complete

## 2023-07-26 NOTE — Consult Note (Signed)
 Consulting Department:  Inpatient medicine  Primary Physician:  Dale Mishicot, MD  Chief Complaint: Left upper extremity weakness and neck pain  History of Present Illness: 07/26/2023 Nina Perez is a 83 y.o. 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  She has had 2 major falls in the past 2 to 3 months.  She is currently admitted for the same reason.  Was found to have bilateral punctate TIAs.  Fortunately she states that her neck pain is much better today.  She does not have any pain that radiates from her neck down to her arm.  No new numbness or tingling.  She states that her pain in her left arm is mostly to the touch and focused around the forearm and wrist.  She does feel that she has weakness in her shoulder but that her elbow continues to have good strength.  Also feels weakness in her hand and wrist.  Review of Systems:  A 10 point review of systems is negative, except for the pertinent positives and negatives detailed in the HPI.  Past Medical History: 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HCC)    a.) 2/2 scleroderma Dx   LBBB (left bundle branch block)    Long term current use of antithrombotics/antiplatelets    a.) ASA + clopidogrel   Long term current use of immunosuppressive drug    a.) MTX for RA and scleroderma Dx   OSA on CPAP    Osteoarthritis    a.) knees, spine   PAD  (peripheral artery disease) (HCC)    Phlebitis    a.) x 2 (with pregnancy)   Pulmonary fibrosis (HCC)    a.) mild   Pulmonary hypertension (HCC) 02/24/2012   a.) TTE 02/24/2012: EF 55%, RVSP 33; b.) TTE 10/12/2014: EF >55%, RVSP 36.1; c.) TTE 09/02/2017: EF 55%, RVSP 45.5   Reflux esophagitis    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 b.) renal US 05/23/2020: solid LEFT renal mass measuring 1.4 x 1.5 x 1.6. 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, sclerodactyly, telangiectasias   Skin cancer 2018   Supplemental oxygen dependent    a.) 2L/[place] PRN for DOE related to mild pulmonary fibrosis and pulmonary HTN   T2DM (type 2 diabetes mellitus) (HCC)     Past Surgical History: Past Surgical History:  Procedure Laterality Date   ABDOMINAL AORTOGRAM W/LOWER EXTREMITY Left 05/16/2019   Procedure: ABDOMINAL AORTOGRAM W/LOWER EXTREMITY;  Surgeon: Runell Gess, MD;  [facility];  Service: Cardiovascular;  Laterality:  Left;   AMPUTATION TOE Left 06/02/2019   Procedure: AMPUTATION TOE;  Surgeon: Recardo Evangelist, DPM;  Location: [facility] ORS;  Service: Podiatry;  Laterality: Left;   APPENDECTOMY  1972   BREAST CYST ASPIRATION Left    neg   COLONOSCOPY WITH PROPOFOL N/A 09/26/2021   Procedure: COLONOSCOPY WITH PROPOFOL;  Surgeon: Jaynie Collins, DO;  Location: [facility] ENDOSCOPY;  Service: Gastroenterology;  Laterality: N/A;  DM, WHEELCHAIR, PLAVIX   CORONARY STENT INTERVENTION N/A 11/24/2022   Procedure: CORONARY STENT INTERVENTION;  Surgeon: Iran Ouch, MD;  Location: [facility] INVASIVE CV LAB;  Service: Cardiovascular;  Laterality: N/A;   ESOPHAGOGASTRODUODENOSCOPY (EGD) WITH PROPOFOL  N/A 09/26/2021   Procedure: ESOPHAGOGASTRODUODENOSCOPY (EGD) WITH PROPOFOL;  Surgeon: Jaynie Collins, DO;  Location: [facility] ENDOSCOPY;  Service: Gastroenterology;  Laterality: N/A;   INCISION AND DRAINAGE OF WOUND Left 06/07/2019   Procedure: IRRIGATION AND DEBRIDEMENT WOUND;  Surgeon: Recardo Evangelist, DPM;  Location: [facility] ORS;  Service: Podiatry;  Laterality: Left;   KNEE ARTHROPLASTY Right 11/13/2021   Procedure: COMPUTER ASSISTED TOTAL KNEE ARTHROPLASTY;  Surgeon: Donato Heinz, MD;  Location: [facility] ORS;  Service: Orthopedics;  Laterality: Right;   LEFT HEART CATH AND CORONARY ANGIOGRAPHY Right 11/24/2022   Procedure: LEFT HEART CATH AND CORONARY ANGIOGRAPHY with possible coronary intervention;  Surgeon: Laurier Nancy, MD;  Location: [facility] INVASIVE CV LAB;  Service: Cardiovascular;  Laterality: Right;   LOWER EXTREMITY ANGIOGRAPHY Left 06/03/2019   Procedure: Lower Extremity Angiography;  Surgeon: Renford Dills, MD;  Location: [facility] INVASIVE CV LAB;  Service: Cardiovascular;  Laterality: Left;   LOWER EXTREMITY ANGIOGRAPHY Right 05/21/2021   Procedure: LOWER EXTREMITY ANGIOGRAPHY;  Surgeon: Renford Dills, MD;  Location: [facility] INVASIVE CV LAB;  Service: Cardiovascular;  Laterality: Right;   PERIPHERAL VASCULAR BALLOON ANGIOPLASTY Left 05/16/2019   Procedure: PERIPHERAL VASCULAR BALLOON ANGIOPLASTY;  Surgeon: Runell Gess, MD;  Location: [facility] CV [facility];  Service: Cardiovascular;  Laterality: Left;  SFA   TUBAL LIGATION  1972   VEIN LIGATION AND STRIPPING      Allergies: Allergies as of 07/18/2023 - Review Complete 07/18/2023  Allergen Reaction Noted   Ethanol Other (See Comments) 10/30/2021   Tape Other (See Comments) 03/25/2021   Triamterene-hctz Other (See Comments) 05/22/2012   Cephalexin Rash 04/03/2021   Hydrochlorothiazide w-triamterene Other (See Comments) 02/27/2014    Medications:  Current Facility-Administered Medications:    acetaminophen (TYLENOL)  tablet 650 mg, 650 mg, Oral, Q4H PRN, 650 mg at 07/25/23 0608 **OR** acetaminophen (TYLENOL) 160 MG/5ML solution 650 mg, 650 mg, Per Tube, Q4H PRN **OR** acetaminophen (TYLENOL) suppository 650 mg, 650 mg, Rectal, Q4H PRN, Lorretta Harp, MD   albuterol (PROVENTIL) (2.5 MG/3ML) 0.083% nebulizer solution 3 mL, 3 mL, Inhalation, Q4H PRN, Lorretta Harp, MD   allopurinol (ZYLOPRIM) tablet 100 mg, 100 mg, Oral, Daily, Lorretta Harp, MD, 100 mg at 07/25/23 0826   amLODipine (NORVASC) tablet 5 mg, 5 mg, Oral, Daily, Gillis Santa, MD, 5 mg at 07/25/23 4098   aspirin EC tablet 81 mg, 81 mg, Oral, Daily, Lorretta Harp, MD, 81 mg at 07/25/23 1191   atorvastatin (LIPITOR) tablet 40 mg, 40 mg, Oral, Daily, Lorretta Harp, MD, 40 mg at 07/25/23 4782   bisacodyl (DULCOLAX) EC tablet 10 mg, 10 mg, Oral, QHS, Gillis Santa, MD, 10 mg at 07/24/23 2150   bisacodyl (DULCOLAX) suppository 10 mg, 10 mg, Rectal, Daily PRN, Gillis Santa, MD   clopidogrel (PLAVIX) tablet 75 mg, 75 mg, Oral, Q breakfast, [place], [place],  MD, 75 mg at 07/25/23 9528   dextromethorphan-guaiFENesin (MUCINEX DM) 30-600 MG per 12 hr tablet 1 tablet, 1 tablet, Oral, BID PRN, Lorretta Harp, MD   enoxaparin (LOVENOX) injection 40 mg, 40 mg, Subcutaneous, Q24H, Lorretta Harp, MD, 40 mg at 07/25/23 2136   folic acid (FOLVITE) tablet 1 mg, 1 mg, Oral, QPM, Lorretta Harp, MD, 1 mg at 07/25/23 1649   hydrALAZINE (APRESOLINE) injection 5 mg, 5 mg, Intravenous, Q2H PRN, Lorretta Harp, MD   insulin aspart (novoLOG) injection 0-5 Units, 0-5 Units, Subcutaneous, QHS, Lorretta Harp, MD   insulin aspart (novoLOG) injection 0-9 Units, 0-9 Units, Subcutaneous, TID WC, Lorretta Harp, MD, 1 Units at 07/25/23 1200   loratadine (CLARITIN) tablet 10 mg, 10 mg, Oral, Daily PRN, Lorretta Harp, MD   magnesium oxide (MAG-OX) tablet 400 mg, 400 mg, Oral, BID, Gillis Santa, MD, 400 mg at 07/25/23 2136   methocarbamol (ROBAXIN) tablet 500 mg, 500 mg, Oral, Q8H PRN, Lorretta Harp, MD, 500 mg at 07/24/23 1209    methotrexate (RHEUMATREX) tablet 10 mg, 10 mg, Oral, Q Wed, Clyde Lundborg, Brien Few, MD, 10 mg at 07/22/23 1159   metoprolol succinate (TOPROL-XL) 24 hr tablet 25 mg, 25 mg, Oral, Daily, Gillis Santa, MD, 25 mg at 07/25/23 4132   nitroGLYCERIN (NITROSTAT) SL tablet 0.4 mg, 0.4 mg, Sublingual, Q5 Min x 3 PRN, Lorretta Harp, MD   ondansetron [facility]) injection 4 mg, 4 mg, Intravenous, Q8H PRN, Lorretta Harp, MD   oxyCODONE (Oxy IR/ROXICODONE) immediate release tablet 2.5 mg, 2.5 mg, Oral, Q6H PRN, Gillis Santa, MD, 2.5 mg at 07/25/23 2024   polyethylene glycol (MIRALAX / GLYCOLAX) packet 17 g, 17 g, Oral, BID, Gillis Santa, MD, 17 g at 07/24/23 2150   senna-docusate (Senokot-S) tablet 1 tablet, 1 tablet, Oral, QHS PRN, Lorretta Harp, MD   [COMPLETED] sodium chloride tablet 2 g, 2 g, Oral, TID WC, 2 g at 07/25/23 1649 **FOLLOWED BY** sodium chloride tablet 1 g, 1 g, Oral, TID WC, Kumar, Dileep, MD   sorbitol, magnesium hydroxide, mineral oil, glycerin (SMOG) enema, 960 mL, Rectal, Once, Gillis Santa, MD   Social History: Social History   Tobacco Use   Smoking status: Former    Current packs/day: 0.00    Average packs/day: 0.5 packs/day for 15.0 years (7.5 ttl pk-yrs)    Types: Cigarettes    Start date: 05/12/1973    Quit date: 05/12/1988    Years since quitting: 35.2   Smokeless tobacco: Never  Vaping Use   Vaping status: Never Used  Substance Use Topics   Alcohol use: No    Alcohol/week: 0.0 standard drinks of alcohol   Drug use: No    Family Medical History: Family History  Problem Relation Age of Onset   Arthritis Mother    Heart attack Father    Cancer Sister    Cancer - Prostate Brother    Colon cancer Neg Hx    Breast cancer Neg Hx     Physical Examination: Vitals:   07/26/23 0012 07/26/23 0504  BP: 132/66 (!) 120/59  Pulse: 73 69  Resp: 20 20  Temp: 98 F (36.7 C) (!) 97.5 F (36.4 C)  SpO2: 91% 93%     General: Patient is well developed, well nourished, calm, collected, and in  no apparent distress.  NEUROLOGICAL:  General: In no acute distress.   Awake, alert, oriented to person, place, and time.  Pupils equal round and reactive to light.  Facial tone is symmetric.  Tongue protrusion is midline.  There is no pronator drift.  No tenderness to palpation in the neck  Strength: Left-sided motor examination of the upper extremity.  She shows activation of her deltoid but has pain and significant limited range of motion.  Formal testing proximately 2 out of 5.  Biceps and triceps are at least 4+, she has 2-3 in handgrip and 2 in wrist extension.   Bilateral upper and lower extremity sensation is intact to light touch.  No evidence of dermatomal sensation loss in the left C5 or C6 dermatomes  Reflexes are 2+ at the left pectoralis major, brachial radialis, biceps and triceps.   Imaging: Narrative & Impression  CLINICAL DATA:  Pain after fall.   EXAM: CT LUMBAR SPINE WITHOUT CONTRAST   TECHNIQUE: Multidetector CT imaging of the lumbar spine was performed without intravenous contrast administration. Multiplanar CT image reconstructions were also generated.   RADIATION DOSE REDUCTION: This exam was performed according to the departmental dose-optimization program which includes automated exposure control, adjustment of the mA and/or kV according to patient size and/or use of iterative reconstruction technique.   COMPARISON:  None Available.   FINDINGS: Segmentation: 5 lumbar type vertebrae.   Alignment: Broad-based dextroscoliotic curvature. Trace retrolisthesis of L2 on L3 and L5 on S1.   Vertebrae: No acute fracture. Vertebral body heights are normal. The posterior elements are intact. Included sacrum is intact.   Paraspinal and other soft tissues: Aortic atherosclerosis. Colonic diverticulosis no paravertebral hematoma.   Disc levels:   L1-L2: No spinal canal or neural foraminal stenosis.   L2-L3: Ligamentum flavum hypertrophy and broad-based  disc bulge causes mild left neural foraminal stenosis.   L3-L4: Facet and ligamentum flavum hypertrophy with broad-based disc bulge causes left neural foraminal stenosis.   L4-L5: Facet ligamentum flavum hypertrophy is well as broad-based disc bulge causes spinal canal and severe right neural foraminal stenosis. Moderate left neural foraminal narrowing.   L5-S1: Facet and ligamentum flavum hypertrophy and broad-based disc bulge causes mild spinal canal and right neural foraminal stenosis.   IMPRESSION: 1. No fracture of the lumbar spine. 2. Multilevel degenerative change with spinal canal and neural foraminal stenosis as described. Mild scoliosis.     Electronically Signed   By: Narda Rutherford M.D.   On: 07/18/2023 16:38     Narrative & Impression  CLINICAL DATA:  Head trauma, minor (Age >= 65y) fall; fall   EXAM: CT HEAD WITHOUT CONTRAST   CT CERVICAL SPINE WITHOUT CONTRAST   TECHNIQUE: Multidetector CT imaging of the head and cervical spine was performed following the standard protocol without intravenous contrast. Multiplanar CT image reconstructions of the cervical spine were also generated.   RADIATION DOSE REDUCTION: This exam was performed according to the departmental dose-optimization program which includes automated exposure control, adjustment of the mA and/or kV according to patient size and/or use of iterative reconstruction technique.   COMPARISON:  None Available.   FINDINGS: CT HEAD FINDINGS   Brain:   Cerebral ventricle sizes are concordant with the degree of cerebral volume loss. Patchy and confluent areas of decreased attenuation are noted throughout the deep and periventricular white matter of the cerebral hemispheres bilaterally, compatible with chronic microvascular ischemic disease. No evidence of large-territorial acute infarction. No parenchymal hemorrhage. No mass lesion. No extra-axial collection.   No mass effect or midline  shift. No hydrocephalus. Basilar cisterns are patent.   Vascular: No hyperdense vessel. Atherosclerotic calcifications are present within the cavernous internal carotid and vertebral arteries.   Skull: No acute fracture or  focal lesion.   Sinuses/Orbits: Paranasal sinuses and mastoid air cells are clear. Bilateral lens replacement. Otherwise the orbits are unremarkable.   Other: None.   CT CERVICAL SPINE FINDINGS   Alignment: Grade 1 anterolisthesis of C7 on T1.   Skull base and vertebrae: Multilevel moderate degenerative changes of the spine most prominent at the C3-C4 level. Multilevel posterior disc osteophyte complex formation. At least moderate osseous foraminal stenosis at the left C4-C5 and C5-C6 level. No severe osseous central canal stenosis. No acute fracture. No aggressive appearing focal osseous lesion or focal pathologic process.   Soft tissues and spinal canal: No prevertebral fluid or swelling. No visible canal hematoma.   Upper chest: Unremarkable.   Other: None.   IMPRESSION: 1. No acute intracranial abnormality. 2. No acute displaced fracture or traumatic listhesis of the cervical spine. 3. Degenerative changes with at least moderate osseous foraminal stenosis at the left C4-C5 and C5-C6 level.     Electronically Signed   By: Tish Frederickson M.D.   On: 07/18/2023 16:35   Narrative & Impression  CLINICAL DATA:  Neuro deficit, acute, stroke suspected   EXAM: MRI HEAD WITHOUT CONTRAST   TECHNIQUE: Multiplanar, multiecho pulse sequences of the brain and surrounding structures were obtained without intravenous contrast.   COMPARISON:  CT head from today.   FINDINGS: Brain: Many punctate acute infarcts in the right greater than left frontal and parietal lobes and the cerebellum. No significant mass effect or midline shift. No evidence of acute hemorrhage, mass lesion or midline shift. Remote bilateral cerebellar infarcts. Moderate patchy  T2/FLAIR hyperintense white matter, compatible chronic microvascular disease. Cerebral atrophy.   Vascular: Major arterial flow voids are maintained at the skull base.   Skull and upper cervical spine: Normal marrow signal.   Sinuses/Orbits: Mild paranasal sinus mucosal thickening. No acute orbital findings.   Other: No mastoid effusions.   IMPRESSION: Many punctate acute infarcts in the right greater than left frontal and parietal lobes and the cerebellum. Given involvement of multiple vascular territories, consider an embolic etiology.     Electronically Signed   By: Feliberto Harts M.D.   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   EXAM: MRI CERVICAL SPINE WITHOUT CONTRAST   TECHNIQUE: Multiplanar, multisequence MR imaging of the cervical spine was performed. No intravenous contrast was administered.   COMPARISON:  None Available.   FINDINGS: Alignment: Grade 1 retrolisthesis at C5-6   Vertebrae: No fracture, evidence of discitis, or bone lesion.   Cord: Normal signal and morphology. Mild prominence of the central canal at C3 and C4.   Posterior Fossa, vertebral arteries, paraspinal tissues: Negative.   Disc levels:   C1-2: Unremarkable.   C2-3: Normal disc space and facet joints. There is no spinal canal stenosis. No neural foraminal stenosis.   C3-4: Small disc bulge with uncovertebral spurring. There is no spinal canal stenosis. No neural foraminal stenosis.   C4-5: Small disc bulge with left-greater-than-right uncovertebral hypertrophy. Mild spinal canal stenosis. Severe left neural foraminal stenosis.   C5-6: Small left asymmetric disc bulge with uncovertebral hypertrophy. Mild spinal canal stenosis. Severe left neural foraminal stenosis.   C6-7: Minimal disc bulge. There is no spinal canal stenosis. No neural foraminal stenosis.   C7-T1: Normal disc space and  facet joints. There is no spinal canal stenosis. No neural foraminal stenosis.   IMPRESSION: 1. Mild spinal canal stenosis at C4-5 and C5-6. 2. Severe left C4-5  and C5-6 neural foraminal stenosis.     Electronically Signed   By: Deatra Robinson M.D.   On: 07/20/2023 21:47    I have personally reviewed the images and agree with the above interpretation.  In addition I personally evaluated the MRI of the cervical spine.  She does have multilevel cervical spondylosis I did not notice T2 signal change that could be consistent with a central cord type phenomenon.  She does have significant left-sided 4 5 and 5 6 foraminal stenosis which could cause impingement,  Labs:    Latest Ref Rng & Units 07/25/2023    4:51 AM 07/24/2023    4:00 AM 07/23/2023    4:21 AM  CBC  WBC 4.0 - 10.5 K/uL 5.0  8.1  6.1   Hemoglobin 12.0 - 15.0 g/dL 78.2  95.6  21.3   Hematocrit 36.0 - 46.0 % 36.1  37.6  36.1   Platelets 150 - 400 K/uL 150  142  130       Latest Ref Rng & Units 07/25/2023    4:51 AM 07/24/2023    4:00 AM 07/23/2023    4:21 AM  BMP  Glucose 70 - 99 mg/dL 84  086  92   BUN 8 - 23 mg/dL 26  24  20    Creatinine 0.44 - 1.00 mg/dL 5.78  4.69  6.29   Sodium 135 - 145 mmol/L 128  128  133   Potassium 3.5 - 5.1 mmol/L 4.3  4.5  4.5   Chloride 98 - 111 mmol/L 100  98  102   CO2 22 - 32 mmol/L 24  22  25    Calcium 8.9 - 10.3 mg/dL 52.8  9.9  41.3         Assessment and Plan: Ms. Nier is a pleasant 83 y.o. female with history of multiple recent falls.  Has been admitted for the past week.  She was found to have punctate multiple cerebral infarcts.  She was also worked up for possible radiculopathy given her neck pain and left arm pain.  Notably her arm pain is on the same side where she had a initial trauma.  On her physical examination she does show weakness in her deltoid, however her bicep tricep continue to have good strength.  She then has weakness in her intrinsic hand muscles and wrist  extensors.  This seems to follow a upper motor neuron type pattern rather than a radiculopathy.  This is further supported by the presence of a pectoralis reflex as well as biceps and brachial radialis reflexes which given her level of weakness I would expect to be decreased if this were from a radiculopathy.  She also has no changes in sensation in the C5 or C6 dermatomes on the left.  In regards to the weakness, this could be neurologic, could also be musculoskeletal as she is at high risk for something like a rotator cuff injury.  She also has some swelling in her hand and tenderness to palpation in the distal radius and ulna as well as the wrist joint.  Would consider images of both her shoulder and her wrist to rule out any direct trauma.  We discussed that if this were to in fact be a radiculopathy that the treatment for this would be either an anterior approach for cervical discectomy and fusion at the C4-5 and C5-6 levels, or posterior approach for decompression.  I discussed that an anterior approach would be putting her at risk for swallowing issues  and a posterior approach higher risk for wound healing issues.  She states that even if we would be able to get her some motor benefit that she would not be interested in the cervical spinal intervention at this time.  I let the patient and her family member know I to review the plans with her primary team.  At this time no plans for surgical intervention.  She can follow-up as an outpatient.  Lovenia Kim, MD/MSCR Dept. of Neurosurgery

## 2023-07-26 NOTE — Plan of Care (Signed)
 Problem: Education: Goal: Ability to describe self-care measures that may prevent or decrease complications (Diabetes Survival Skills Education) will improve Outcome: Progressing Goal: Individualized Educational Video(s) Outcome: Progressing   Problem: Coping: Goal: Ability to adjust to condition or change in health will improve Outcome: Progressing   Problem: Fluid Volume: Goal: Ability to maintain a balanced intake and output will improve Outcome: Progressing   Problem: Health Behavior/Discharge Planning: Goal: Ability to identify and utilize available resources and services will improve Outcome: Progressing Goal: Ability to manage health-related needs will improve Outcome: Progressing   Problem: Metabolic: Goal: Ability to maintain appropriate glucose levels will improve Outcome: Progressing   Problem: Nutritional: Goal: Maintenance of adequate nutrition will improve Outcome: Progressing Goal: Progress toward achieving an optimal weight will improve Outcome: Progressing   Problem: Skin Integrity: Goal: Risk for impaired skin integrity will decrease Outcome: Progressing   Problem: Tissue Perfusion: Goal: Adequacy of tissue perfusion will improve Outcome: Progressing   Problem: Education: Goal: Knowledge of disease or condition will improve Outcome: Progressing Goal: Knowledge of secondary prevention will improve (MUST DOCUMENT ALL) Outcome: Progressing Goal: Knowledge of patient specific risk factors will improve (DELETE if not current risk factor) Outcome: Progressing   Problem: Ischemic Stroke/TIA Tissue Perfusion: Goal: Complications of ischemic stroke/TIA will be minimized Outcome: Progressing   Problem: Coping: Goal: Will verbalize positive feelings about self Outcome: Progressing Goal: Will identify appropriate support needs Outcome: Progressing   Problem: Health Behavior/Discharge Planning: Goal: Ability to manage health-related needs will  improve Outcome: Progressing Goal: Goals will be collaboratively established with patient/family Outcome: Progressing   Problem: Self-Care: Goal: Ability to participate in self-care as condition permits will improve Outcome: Progressing Goal: Verbalization of feelings and concerns over difficulty with self-care will improve Outcome: Progressing Goal: Ability to communicate needs accurately will improve Outcome: Progressing   Problem: Nutrition: Goal: Risk of aspiration will decrease Outcome: Progressing Goal: Dietary intake will improve Outcome: Progressing   Problem: Education: Goal: Knowledge of disease or condition will improve Outcome: Progressing Goal: Knowledge of secondary prevention will improve (MUST DOCUMENT ALL) Outcome: Progressing Goal: Knowledge of patient specific risk factors will improve (DELETE if not current risk factor) Outcome: Progressing   Problem: Ischemic Stroke/TIA Tissue Perfusion: Goal: Complications of ischemic stroke/TIA will be minimized Outcome: Progressing   Problem: Coping: Goal: Will verbalize positive feelings about self Outcome: Progressing Goal: Will identify appropriate support needs Outcome: Progressing   Problem: Health Behavior/Discharge Planning: Goal: Ability to manage health-related needs will improve Outcome: Progressing Goal: Goals will be collaboratively established with patient/family Outcome: Progressing   Problem: Self-Care: Goal: Ability to participate in self-care as condition permits will improve Outcome: Progressing Goal: Verbalization of feelings and concerns over difficulty with self-care will improve Outcome: Progressing Goal: Ability to communicate needs accurately will improve Outcome: Progressing   Problem: Nutrition: Goal: Risk of aspiration will decrease Outcome: Progressing Goal: Dietary intake will improve Outcome: Progressing   Problem: Education: Goal: Knowledge of General Education  information will improve Description: Including pain rating scale, medication(s)/side effects and non-pharmacologic comfort measures Outcome: Progressing   Problem: Health Behavior/Discharge Planning: Goal: Ability to manage health-related needs will improve Outcome: Progressing   Problem: Clinical Measurements: Goal: Ability to maintain clinical measurements within normal limits will improve Outcome: Progressing Goal: Will remain free from infection Outcome: Progressing Goal: Diagnostic test results will improve Outcome: Progressing Goal: Respiratory complications will improve Outcome: Progressing Goal: Cardiovascular complication will be avoided Outcome: Progressing   Problem: Activity: Goal: Risk for activity intolerance will decrease Outcome:  Progressing   Problem: Nutrition: Goal: Adequate nutrition will be maintained Outcome: Progressing   Problem: Coping: Goal: Level of anxiety will decrease Outcome: Progressing   Problem: Elimination: Goal: Will not experience complications related to bowel motility Outcome: Progressing Goal: Will not experience complications related to urinary retention Outcome: Progressing

## 2023-07-26 NOTE — Plan of Care (Signed)
 Problem: Education: Goal: Ability to describe self-care measures that may prevent or decrease complications (Diabetes Survival Skills Education) will improve Outcome: Progressing Goal: Individualized Educational Video(s) Outcome: Progressing   Problem: Coping: Goal: Ability to adjust to condition or change in health will improve Outcome: Progressing   Problem: Fluid Volume: Goal: Ability to maintain a balanced intake and output will improve Outcome: Progressing   Problem: Health Behavior/Discharge Planning: Goal: Ability to identify and utilize available resources and services will improve Outcome: Progressing Goal: Ability to manage health-related needs will improve Outcome: Progressing   Problem: Metabolic: Goal: Ability to maintain appropriate glucose levels will improve Outcome: Progressing   Problem: Nutritional: Goal: Maintenance of adequate nutrition will improve Outcome: Progressing Goal: Progress toward achieving an optimal weight will improve Outcome: Progressing   Problem: Skin Integrity: Goal: Risk for impaired skin integrity will decrease Outcome: Progressing   Problem: Tissue Perfusion: Goal: Adequacy of tissue perfusion will improve Outcome: Progressing   Problem: Education: Goal: Knowledge of disease or condition will improve Outcome: Progressing Goal: Knowledge of secondary prevention will improve (MUST DOCUMENT ALL) Outcome: Progressing Goal: Knowledge of patient specific risk factors will improve (DELETE if not current risk factor) Outcome: Progressing   Problem: Ischemic Stroke/TIA Tissue Perfusion: Goal: Complications of ischemic stroke/TIA will be minimized Outcome: Progressing   Problem: Coping: Goal: Will verbalize positive feelings about self Outcome: Progressing Goal: Will identify appropriate support needs Outcome: Progressing   Problem: Health Behavior/Discharge Planning: Goal: Ability to manage health-related needs will  improve Outcome: Progressing Goal: Goals will be collaboratively established with patient/family Outcome: Progressing   Problem: Self-Care: Goal: Ability to participate in self-care as condition permits will improve Outcome: Progressing Goal: Verbalization of feelings and concerns over difficulty with self-care will improve Outcome: Progressing Goal: Ability to communicate needs accurately will improve Outcome: Progressing   Problem: Nutrition: Goal: Risk of aspiration will decrease Outcome: Progressing Goal: Dietary intake will improve Outcome: Progressing   Problem: Education: Goal: Knowledge of General Education information will improve Description: Including pain rating scale, medication(s)/side effects and non-pharmacologic comfort measures Outcome: Progressing   Problem: Health Behavior/Discharge Planning: Goal: Ability to manage health-related needs will improve Outcome: Progressing   Problem: Clinical Measurements: Goal: Ability to maintain clinical measurements within normal limits will improve Outcome: Progressing Goal: Will remain free from infection Outcome: Progressing Goal: Diagnostic test results will improve Outcome: Progressing Goal: Respiratory complications will improve Outcome: Progressing Goal: Cardiovascular complication will be avoided Outcome: Progressing   Problem: Activity: Goal: Risk for activity intolerance will decrease Outcome: Progressing   Problem: Nutrition: Goal: Adequate nutrition will be maintained Outcome: Progressing   Problem: Coping: Goal: Level of anxiety will decrease Outcome: Progressing   Problem: Elimination: Goal: Will not experience complications related to bowel motility Outcome: Progressing Goal: Will not experience complications related to urinary retention Outcome: Progressing   Problem: Education: Goal: Knowledge of disease or condition will improve Outcome: Progressing Goal: Knowledge of secondary  prevention will improve (MUST DOCUMENT ALL) Outcome: Progressing Goal: Knowledge of patient specific risk factors will improve (DELETE if not current risk factor) Outcome: Progressing   Problem: Ischemic Stroke/TIA Tissue Perfusion: Goal: Complications of ischemic stroke/TIA will be minimized Outcome: Progressing   Problem: Coping: Goal: Will verbalize positive feelings about self Outcome: Progressing Goal: Will identify appropriate support needs Outcome: Progressing   Problem: Health Behavior/Discharge Planning: Goal: Ability to manage health-related needs will improve Outcome: Progressing Goal: Goals will be collaboratively established with patient/family Outcome: Progressing   Problem: Self-Care: Goal: Ability to participate in  self-care as condition permits will improve Outcome: Progressing Goal: Verbalization of feelings and concerns over difficulty with self-care will improve Outcome: Progressing Goal: Ability to communicate needs accurately will improve Outcome: Progressing   Problem: Nutrition: Goal: Risk of aspiration will decrease Outcome: Progressing Goal: Dietary intake will improve Outcome: Progressing

## 2023-07-26 NOTE — Progress Notes (Signed)
 Progress Note   Patient: Nina Perez GNF:[id number] DOB: 1941-05-04 DOA: 07/18/2023     8 DOS: the patient was seen and examined on 07/26/2023   Brief hospital course: 83 y.o. female with medical history significant of HTN, HLD, DM, PVD, CAD with stent, dCHF, gout, RA, scleroderma, mild pulmonary fibrosis, pulmonary hypertension, left bundle blockage, CKD 3A, who presents with fall and weakness, urinary frequency.  Subsequently found to have BL CVAs.  Assessment and Plan:  Acute bilateral CVA - Likely contributing etiology to patient's weakness.   MRI noting punctate acute infarcts in bilateral frontal/parietal lobes and cerebellum.  Obvious concern for embolic etiology.  CT angio unremarkable.  Echo LVEF 55 to 60%, moderate LV hypertrophy, grade 1 diastolic dysfunction LDL 32, below goal 70, A1c 6.1, below goal 7%, TSH 4.1 WNL Neuro consulted, recommended to continue DAPT due to recent cardiac stent, continue statin.  30 day Cardiac monitor on discharge Informed to [facility] for cardiac monitor PT/OT evaluation for possible SNF placement MRI C-spine due to persistent weakness as bellow    Cervical stenosis MRI C-spine: Mild spinal canal stenosis at C4-5 and C5-6. Severe left C4-5 and C5-6 neural foraminal stenosis.  Neurology recommended that neuroforaminal stenosis could be contributing to some of her left upper extremity weakness, given pain and effort limitations on exam would need EMG/nerve conduction study to confirm which could be completed outpatient if desired by patient/family.  3/16 Neurosurgery consulted, recommended no surgical intervention, left arm weakness is due to stroke, less likely due to cervical stenosis.  No surgical intervention.  May follow-up as an outpatient.    Pyuria, UTI ruled out Patient denied any dysuria, no UTI symptoms urine culture growing multiple species, suggestive reticulation  WBC within normal range S/p ceftriaxone and Levaquin DC'd on  3/10 Monitor off antibiotics for now  CAD s/p cardiac stent, slightly elevated troponin resumed DAPT with statin medications.   No chest pain, shortness of breath to suggest ACS.  Diabetes mellitus - Insulin sliding scale on board.  Hypertension Resumed Toprol xl 25 mg pod and Amlodipine 5 mg po daily Monitor BP and titrate medications accordingly  Chronic HFpEF Well compensated Continue Toprol xl 25 mg po daily   Peripheral vascular disease - Aspirin, Plavix, Lipitor on board.  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  Nebulizers as needed.  Hyponatremia possibly due to nutritional deficiency Serum osmolarity 281 within normal range Na 128--130 Started Salt tabs  Hypomagnesemia, mag repleted. Monitor electrolytes  Obesity, class I BMI 31.7 Encouraged weight loss, cardiac/diabetic diet.  Gout - Does not appear to be acutely exacerbated.  Allopurinol on board. Uric acid level 5.1 wnl  Constipation, started laxatives.  Suppository x 1 given on 3/12, Use enema if no BM  3/14 Dulcolax suppository x 1 dose Pt had a BM on 3/14 3/16 Dulcolax suppository one-time dose order placed  DVT prophylaxis: Lovenox [place]   Active Pressure Injury/Wound(s)     Pressure Ulcer  Duration          Pressure Injury 04/11/23 Heel Left Stage 1 -  Intact skin with non-blanchable redness of a localized area usually over a bony prominence. 100 days            Subjective: No significant events overnight, slept good last night, neck pain almost resolved 1/10, c/o mild pain in the left wrist and left shoulder, denies any other problems. Overall she is feeling better as compared to yesterday.   Physical Exam:  Vitals:   07/26/23 0012 07/26/23 0332 07/26/23 0504 07/26/23 0837  BP: 132/66  (!) 120/59 130/65  Pulse: 73  69 77  Resp: 20  20 19   Temp: 98 F (36.7 C)  (!) 97.5 F (36.4 C) 98.4 F (36.9 C)  TempSrc: Oral   Oral   SpO2: 91%  93% 95%  Weight:  93 kg    Height:       GENERAL:  Alert, pleasant, no acute distress  HEENT:  EOMI CARDIOVASCULAR:  RRR, no murmurs appreciated RESPIRATORY:  Clear to auscultation, no wheezing, rales, or rhonchi GASTROINTESTINAL:  Soft, nontender, nondistended EXTREMITIES:  No LE edema bilaterally NEURO: CN II through XII grossly intact, left arm weak power 4/5, no any other focal deficits SKIN:  No rashes noted PSYCH:  Appropriate mood and affect   Data Reviewed:  There are no new results to review at this time.  Family Communication: Husband at bedside  Disposition: Status is: Inpatient Remains inpatient appropriate because: Acute CVA  Planned Discharge Destination: Skilled nursing facility    Patient is stable to discharge to SNF when bed will be available.  Time spent: 35 minutes  Author: Gillis Santa, MD 07/26/2023 11:01 AM  For on call review [email or web address].

## 2023-07-26 NOTE — Plan of Care (Addendum)
 Patient is alert and oriented X 4. She got dulcolax suppository as order and had bowel movement. Denies any pain. Plan of care ongoing.   Problem: Education: Goal: Ability to describe self-care measures that may prevent or decrease complications (Diabetes Survival Skills Education) will improve Outcome: Progressing Goal: Individualized Educational Video(s) Outcome: Progressing   Problem: Coping: Goal: Ability to adjust to condition or change in health will improve Outcome: Progressing   Problem: Health Behavior/Discharge Planning: Goal: Ability to identify and utilize available resources and services will improve Outcome: Progressing Goal: Ability to manage health-related needs will improve Outcome: Progressing   Problem: Metabolic: Goal: Ability to maintain appropriate glucose levels will improve Outcome: Progressing   Problem: Skin Integrity: Goal: Risk for impaired skin integrity will decrease Outcome: Progressing   Problem: Tissue Perfusion: Goal: Adequacy of tissue perfusion will improve Outcome: Progressing   Problem: Education: Goal: Knowledge of disease or condition will improve Outcome: Progressing Goal: Knowledge of secondary prevention will improve (MUST DOCUMENT ALL) Outcome: Progressing Goal: Knowledge of patient specific risk factors will improve (DELETE if not current risk factor) Outcome: Progressing   Problem: Ischemic Stroke/TIA Tissue Perfusion: Goal: Complications of ischemic stroke/TIA will be minimized Outcome: Progressing   Problem: Health Behavior/Discharge Planning: Goal: Ability to manage health-related needs will improve Outcome: Progressing Goal: Goals will be collaboratively established with patient/family Outcome: Progressing   Problem: Nutrition: Goal: Risk of aspiration will decrease Outcome: Progressing Goal: Dietary intake will improve Outcome: Progressing   Problem: Ischemic Stroke/TIA Tissue Perfusion: Goal: Complications of  ischemic stroke/TIA will be minimized Outcome: Progressing   Problem: Self-Care: Goal: Ability to participate in self-care as condition permits will improve Outcome: Progressing Goal: Verbalization of feelings and concerns over difficulty with self-care will improve Outcome: Progressing Goal: Ability to communicate needs accurately will improve Outcome: Progressing   Problem: Education: Goal: Knowledge of General Education information will improve Description: Including pain rating scale, medication(s)/side effects and non-pharmacologic comfort measures Outcome: Progressing

## 2023-07-27 ENCOUNTER — Telehealth: Payer: Self-pay | Admitting: Internal Medicine

## 2023-07-27 ENCOUNTER — Inpatient Hospital Stay

## 2023-07-27 DIAGNOSIS — I6340 Cerebral infarction due to embolism of unspecified cerebral artery: Secondary | ICD-10-CM | POA: Diagnosis not present

## 2023-07-27 DIAGNOSIS — R29706 NIHSS score 6: Secondary | ICD-10-CM

## 2023-07-27 DIAGNOSIS — I639 Cerebral infarction, unspecified: Secondary | ICD-10-CM | POA: Diagnosis not present

## 2023-07-27 LAB — CK: Total CK: 105 U/L (ref 38–234)

## 2023-07-27 LAB — CBC
HCT: 33.2 % — ABNORMAL LOW (ref 36.0–46.0)
Hemoglobin: 10.9 g/dL — ABNORMAL LOW (ref 12.0–15.0)
MCH: 33.4 pg (ref 26.0–34.0)
MCHC: 32.8 g/dL (ref 30.0–36.0)
MCV: 101.8 fL — ABNORMAL HIGH (ref 80.0–100.0)
Platelets: 215 10*3/uL (ref 150–400)
RBC: 3.26 MIL/uL — ABNORMAL LOW (ref 3.87–5.11)
RDW: 15.7 % — ABNORMAL HIGH (ref 11.5–15.5)
WBC: 4.3 10*3/uL (ref 4.0–10.5)
nRBC: 0 % (ref 0.0–0.2)

## 2023-07-27 LAB — BASIC METABOLIC PANEL
Anion gap: 6 (ref 5–15)
BUN: 32 mg/dL — ABNORMAL HIGH (ref 8–23)
CO2: 25 mmol/L (ref 22–32)
Calcium: 10 mg/dL (ref 8.9–10.3)
Chloride: 101 mmol/L (ref 98–111)
Creatinine, Ser: 0.79 mg/dL (ref 0.44–1.00)
GFR, Estimated: >60 mL/min (ref >60)
Glucose, Bld: 87 mg/dL (ref 70–99)
Potassium: 4.2 mmol/L (ref 3.5–5.1)
Sodium: 132 mmol/L — ABNORMAL LOW (ref 135–145)

## 2023-07-27 LAB — MAGNESIUM: Magnesium: 2.1 mg/dL (ref 1.7–2.4)

## 2023-07-27 LAB — GLUCOSE, CAPILLARY
Glucose-Capillary: 73 mg/dL (ref 70–99)
Glucose-Capillary: 94 mg/dL (ref 70–99)
Glucose-Capillary: 96 mg/dL (ref 70–99)
Glucose-Capillary: 118 mg/dL — ABNORMAL HIGH (ref 70–99)

## 2023-07-27 LAB — PHOSPHORUS: Phosphorus: 2.5 mg/dL (ref 2.5–4.6)

## 2023-07-27 NOTE — Progress Notes (Signed)
 Occupational Therapy Treatment Patient Details Name: Nina Perez MRN: [id number] DOB: March 14, 1941 Today's Date: 07/27/2023   History of present illness Patient is a 83 year old female presenting with generalized weakness and fall. 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 Recent rehab after hospitalization for cellulitis. History of aortic atherosclerosis, hypertension, interstitial lung disease, osteoarthritis, peripheral artery disease, pulmonary fibrosis, pulmonary hypertension, rheumatoid arthritis   OT comments  Pt seen for OT cotx with PT to optimize ADL mobility. Pt endorses some stiffness and discomfort in L shoulder and L knee. Pt required MOD-MAX A +2 for bed mobility and once EOB initially reaching for bed rail with RUE causing significant R lateral lean requiring assist and VC to correct, improving by end of session to close SBA +2. Pt engaged in dynamic reaching while seated EOB with emphasis on WBing through LUE, reaching with RUE to L and L front to cross midline with noted improvement in sitting balance afterwards. Pt stood requiring MOD-MAX A +2 with heavy VC and TC for hand placement on RW and improving posture. LUE supported throughout 2/2 questionable subluxation noted. MD and RN notified after session. Pt progressing and continues to benefit.       If plan is discharge home, recommend the following:  Two people to help with walking and/or transfers;A lot of help with bathing/dressing/bathroom;Assistance with cooking/housework;Assist for transportation;Help with stairs or ramp for entrance   Equipment Recommendations  Other (comment) (defer)    Recommendations for Other Services      Precautions / Restrictions Precautions Precautions: Fall Recall of Precautions/Restrictions: Intact Restrictions Weight Bearing Restrictions Per Provider Order: No       Mobility Bed Mobility Overal bed mobility: Needs Assistance Bed Mobility:  Supine to Sit, Sit to Supine     Supine to sit: Mod assist, Max assist, +2 for physical assistance Sit to supine: Mod assist, Max assist, +2 for physical assistance   General bed mobility comments: assist for trunk and B LE's; vc's for use of bed rail and overall technique    Transfers Overall transfer level: Needs assistance Equipment used: Rolling walker (2 wheels) Transfers: Sit to/from Stand Sit to Stand: Mod assist, Max assist, +2 physical assistance           General transfer comment: 1st trial standing pt able to come to 3/4th stand with mod to max assist x2; 2nd trial standing pt able to come to standing with mod assist x2; R knee blocked; vc's for UE/LE placement and overall technique; use of RW     Balance Overall balance assessment: Needs assistance, History of Falls Sitting-balance support: Bilateral upper extremity supported, Feet supported Sitting balance-Leahy Scale: Fair Sitting balance - Comments: initially max assist for static sitting balance but improved to close SBA; CGA for safety with dynamic reaching sitting EOB (reaching across body, to L, and to front of body with R UE) Postural control: Right lateral lean Standing balance support: Bilateral upper extremity supported, During functional activity, Reliant on assistive device for balance Standing balance-Leahy Scale: Poor Standing balance comment: 2 assist for safety in standing with RW use                           ADL either performed or assessed with clinical judgement   ADL Overall ADL's : Needs assistance/impaired     Grooming: Sitting;Wash/dry face  Toilet Transfer Details (indicate cue type and reason): bed level rolling to place on bed pan at end of session                Extremity/Trunk Assessment              Vision       Perception     Praxis     Communication Communication Communication: No apparent difficulties   Cognition  Arousal: Alert Behavior During Therapy: Anxious Cognition: No apparent impairments                               Following commands: Intact        Cueing   Cueing Techniques: Verbal cues, Tactile cues  Exercises Other Exercises Other Exercises: Pt engaged in dynamic reaching while seated EOB with emphasis on WBing through LUE, reaching with RUE to L and L front to cross midline with noted improvement in sitting balance afterwards    Shoulder Instructions       General Comments      Pertinent Vitals/ Pain       Pain Assessment Pain Assessment: Faces Faces Pain Scale: Hurts little more Pain Location: L shoulder and L knee with movement Pain Descriptors / Indicators: Grimacing, Guarding Pain Intervention(s): Limited activity within patient's tolerance, Monitored during session, Premedicated before session, Repositioned  Home Living                                          Prior Functioning/Environment              Frequency  Min 2X/week        Progress Toward Goals  OT Goals(current goals can now be found in the care plan section)  Progress towards OT goals: Progressing toward goals  Acute Rehab OT Goals Patient Stated Goal: get stronger OT Goal Formulation: With patient/family Time For Goal Achievement: 08/02/23 Potential to Achieve Goals: Good  Plan      Co-evaluation    PT/OT/SLP Co-Evaluation/Treatment: Yes Reason for Co-Treatment: Complexity of the patient's impairments (multi-system involvement) PT goals addressed during session: Mobility/safety with mobility;Balance;Proper use of DME OT goals addressed during session: ADL's and self-care      AM-PAC OT "6 Clicks" Daily Activity     Outcome Measure   Help from another person eating meals?: None Help from another person taking care of personal grooming?: A Little Help from another person toileting, which includes using toliet, bedpan, or urinal?: A Lot Help from  another person bathing (including washing, rinsing, drying)?: A Lot Help from another person to put on and taking off regular upper body clothing?: A Lot Help from another person to put on and taking off regular lower body clothing?: A Lot 6 Click Score: 15    End of Session Equipment Utilized During Treatment: Rolling walker (2 wheels);Gait belt  OT Visit Diagnosis: Unsteadiness on feet (R26.81);Other abnormalities of gait and mobility (R26.89);Repeated falls (R29.6);History of falling (Z91.81);Muscle weakness (generalized) (M62.81)   Activity Tolerance Patient tolerated treatment well   Patient Left in bed;with call bell/phone within reach;with bed alarm set;with family/visitor present   Nurse Communication Other (comment) (L shoulder sublux)        Time: 4010-2725 OT Time Calculation (min): 35 min  Charges: OT General Charges $OT Visit: 1 Visit OT Treatments $Therapeutic Activity: 8-22  mins  Arman Filter., MPH, MS, OTR/L ascom [phone] 07/27/23, 5:18 PM

## 2023-07-27 NOTE — Telephone Encounter (Signed)
 Copied from CRM [id number]. Topic: Medical Record Request - Other >> Jul 27, 2023  3:00 PM Chantha C wrote: Reason for CRM: Jae Dire from [employer] [phone] ext [phone] checking on faxed 07/13/23 and 07/20/23 for 60 months oxygen replacement. Jae Dire will fax again today for provider to sign form. Please advise and call back.

## 2023-07-27 NOTE — Progress Notes (Addendum)
 Progress Note   Patient: Nina Perez:[id number] DOB: Sep 06, 1940 DOA: 07/18/2023     9 DOS: the patient was seen and examined on 07/27/2023   Brief hospital course: 83 y.o. female with medical history significant of HTN, HLD, DM, PVD, CAD with stent, dCHF, gout, RA, scleroderma, mild pulmonary fibrosis, pulmonary hypertension, left bundle blockage, CKD 3A, who presents with fall and weakness, urinary frequency.  Subsequently found to have BL CVAs.  Assessment and Plan:  Acute bilateral CVA - Likely contributing etiology to patient's weakness.   MRI noting punctate acute infarcts in bilateral frontal/parietal lobes and cerebellum.  Obvious concern for embolic etiology.  CT angio unremarkable.  Echo LVEF 55 to 60%, moderate LV hypertrophy, grade 1 diastolic dysfunction LDL 32, below goal 70, A1c 6.1, below goal 7%, TSH 4.1 WNL Neuro consulted, recommended to continue DAPT due to recent cardiac stent, continue statin.  30 day Cardiac monitor on discharge Informed to [facility] for cardiac monitor PT/OT evaluation for possible SNF placement MRI C-spine due to persistent weakness as bellow  3/17 physical debility due to laying in bed, seen again by Neuro Rec T-spine MRI but refused.   Cervical stenosis MRI C-spine: Mild spinal canal stenosis at C4-5 and C5-6. Severe left C4-5 and C5-6 neural foraminal stenosis.  Neurology recommended that neuroforaminal stenosis could be contributing to some of her left upper extremity weakness, given pain and effort limitations on exam would need EMG/nerve conduction study to confirm which could be completed outpatient if desired by patient/family.  3/16 Neurosurgery consulted, recommended no surgical intervention, left arm weakness is due to stroke, less likely due to cervical stenosis.  No surgical intervention.  May follow-up as an outpatient.    Pyuria, UTI ruled out Patient denied any dysuria, no UTI symptoms urine culture growing multiple  species, suggestive reticulation  WBC within normal range S/p ceftriaxone and Levaquin DC'd on 3/10 Monitor off antibiotics for now  CAD s/p cardiac stent, slightly elevated troponin resumed DAPT with statin medications.   No chest pain, shortness of breath to suggest ACS.  Diabetes mellitus - Insulin sliding scale on board.  Hypertension Resumed Toprol xl 25 mg pod and Amlodipine 5 mg po daily Monitor BP and titrate medications accordingly  Chronic HFpEF Well compensated Continue Toprol xl 25 mg po daily   Peripheral vascular disease - Aspirin, Plavix, Lipitor on board.  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  Nebulizers as needed.  Hyponatremia possibly due to nutritional deficiency Serum osmolarity 281 within normal range Na 128--130--132 Started Salt tabs  Hypomagnesemia, mag repleted. Monitor electrolytes  Obesity, class I BMI 31.7 Encouraged weight loss, cardiac/diabetic diet.  Gout - Does not appear to be acutely exacerbated.  Allopurinol on board. Uric acid level 5.1 wnl  Constipation, started laxatives.  Suppository x 1 given on 3/12, Use enema if no BM  3/14 Dulcolax suppository x 1 dose Pt had a BM on 3/16 3/16 Dulcolax suppository one-time dose   DVT prophylaxis: Lovenox [place]   Active Pressure Injury/Wound(s)     Pressure Ulcer  Duration          Pressure Injury 04/11/23 Heel Left Stage 1 -  Intact skin with non-blanchable redness of a localized area usually over a bony prominence. 100 days            Subjective: No significant events overnight, pt is feeling same as before no changes in the left arm weakness but more generalized physical debility due to  lack of significant activity Denies any chest pains, or palpitations and No shortness of breath.   Physical Exam: Vitals:   07/27/23 0404 07/27/23 0517 07/27/23 0804 07/27/23 1146  BP: 129/61  127/64 (!) 149/68  Pulse: 71  69 73   Resp: 16  17 17   Temp: 98.4 F (36.9 C)  97.8 F (36.6 C) 97.8 F (36.6 C)  TempSrc: Oral     SpO2: 96%  98% 92%  Weight:  91.9 kg    Height:       GENERAL:  Alert, pleasant, no acute distress  HEENT:  EOMI CARDIOVASCULAR:  RRR, no murmurs appreciated RESPIRATORY:  Clear to auscultation, no wheezing, rales, or rhonchi GASTROINTESTINAL:  Soft, nontender, nondistended EXTREMITIES:  No LE edema bilaterally NEURO: CN II through XII grossly intact, left arm weak power 4/5, no any other focal deficits SKIN:  No rashes noted PSYCH:  Appropriate mood and affect   Data Reviewed:  There are no new results to review at this time.  Family Communication: Husband at bedside  Disposition: Status is: Inpatient Remains inpatient appropriate because: Acute CVA  Planned Discharge Destination: Skilled nursing facility    Patient is stable to discharge to SNF when bed will be available.  Time spent: 35 minutes  Author: Gillis Santa, MD 07/27/2023 2:27 PM  For on call review [email or web address].

## 2023-07-27 NOTE — Progress Notes (Signed)
 Physical Therapy Treatment Patient Details Name: Nina Perez MRN: [id number] DOB: 09-01-1940 Today's Date: 07/27/2023   History of Present Illness Patient is a 83 year old female presenting with generalized weakness and fall. 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 Recent rehab after hospitalization for cellulitis. History of aortic atherosclerosis, hypertension, interstitial lung disease, osteoarthritis, peripheral artery disease, pulmonary fibrosis, pulmonary hypertension, rheumatoid arthritis    PT Comments  PT/OT co-treatment performed; pt agreeable to therapy; pt's husband present.  During session pt mod to max assist x2 with bed mobility; sitting balance max assist progressing to close SBA; then CGA for dynamic reaching activities sitting EOB; mod to max assist x2 for 3/4th stand up to RW; mod assist x2 for full stand up to RW.  Limited activity d/t pt fatigue/generalized weakness.  Pt appearing anxious during session requiring extra time and re-assurance with sessions activities.  Will continue to focus on strengthening, balance, and progressive functional mobility during hospitalization.   If plan is discharge home, recommend the following: Two people to help with walking and/or transfers;A lot of help with bathing/dressing/bathroom;Assist for transportation;Help with stairs or ramp for entrance;Assistance with cooking/housework   Can travel by private vehicle     No  Equipment Recommendations  None recommended by PT    Recommendations for Other Services       Precautions / Restrictions Precautions Precautions: Fall Recall of Precautions/Restrictions: Intact Restrictions Weight Bearing Restrictions Per Provider Order: No     Mobility  Bed Mobility Overal bed mobility: Needs Assistance Bed Mobility: Supine to Sit, Sit to Supine     Supine to sit: Mod assist, Max assist, +2 for physical assistance Sit to supine: Mod assist, Max  assist, +2 for physical assistance   General bed mobility comments: assist for trunk and B LE's; vc's for use of bed rail and overall technique    Transfers Overall transfer level: Needs assistance Equipment used: Rolling walker (2 wheels) Transfers: Sit to/from Stand Sit to Stand: Mod assist, Max assist, +2 physical assistance           General transfer comment: 1st trial standing pt able to come to 3/4th stand with mod to max assist x2; 2nd trial standing pt able to come to standing with mod assist x2; R knee blocked; vc's for UE/LE placement and overall technique; use of RW    Ambulation/Gait               General Gait Details: Deferred d/t safety concerns   Stairs             Wheelchair Mobility     Tilt Bed    Modified Rankin (Stroke Patients Only)       Balance Overall balance assessment: Needs assistance, History of Falls Sitting-balance support: Bilateral upper extremity supported, Feet supported Sitting balance-Leahy Scale: Fair Sitting balance - Comments: initially max assist for static sitting balance but improved to close SBA; CGA for safety with dynamic reaching sitting EOB (reaching across body, to L, and to front of body with R UE) Postural control: Right lateral lean Standing balance support: Bilateral upper extremity supported, During functional activity, Reliant on assistive device for balance Standing balance-Leahy Scale: Poor Standing balance comment: 2 assist for safety in standing with RW use                            Communication Communication Communication: No apparent difficulties  Cognition Arousal: Alert  Behavior During Therapy: Anxious   PT - Cognitive impairments: No apparent impairments                         Following commands: Intact      Cueing Cueing Techniques: Verbal cues, Tactile cues  Exercises      General Comments  Pt agreeable to PT session.      Pertinent Vitals/Pain Pain  Assessment Pain Assessment: Faces Pain Score: 4  Pain Location: L shoulder and L knee with movement Pain Descriptors / Indicators: Grimacing, Guarding Pain Intervention(s): Limited activity within patient's tolerance, Monitored during session, Premedicated before session, Repositioned HR in 90's bpm during session.    Home Living                          Prior Function            PT Goals (current goals can now be found in the care plan section) Acute Rehab PT Goals Patient Stated Goal: to go to rehab to get stronger PT Goal Formulation: With patient Time For Goal Achievement: 08/03/23 Potential to Achieve Goals: Fair Progress towards PT goals: Progressing toward goals    Frequency    Min 2X/week      PT Plan      Co-evaluation PT/OT/SLP Co-Evaluation/Treatment: Yes Reason for Co-Treatment: Complexity of the patient's impairments (multi-system involvement) PT goals addressed during session: Mobility/safety with mobility;Balance;Proper use of DME OT goals addressed during session: ADL's and self-care      AM-PAC PT "6 Clicks" Mobility   Outcome Measure  Help needed turning from your back to your side while in a flat bed without using bedrails?: A Lot Help needed moving from lying on your back to sitting on the side of a flat bed without using bedrails?: Total Help needed moving to and from a bed to a chair (including a wheelchair)?: Total Help needed standing up from a chair using your arms (e.g., wheelchair or bedside chair)?: Total Help needed to walk in hospital room?: Total Help needed climbing 3-5 steps with a railing? : Total 6 Click Score: 7    End of Session Equipment Utilized During Treatment: Gait belt Activity Tolerance: Patient limited by fatigue Patient left: in bed;with call bell/phone within reach;with bed alarm set;with family/visitor present;Other (comment) Nurse Communication: Mobility status;Precautions PT Visit Diagnosis: Muscle  weakness (generalized) (M62.81);Unsteadiness on feet (R26.81)     Time: 4098-1191 PT Time Calculation (min) (ACUTE ONLY): 35 min  Charges:    $Therapeutic Activity: 8-22 mins PT General Charges $$ ACUTE PT VISIT: 1 Visit                     Hendricks Limes, PT 07/27/23, 5:11 PM

## 2023-07-27 NOTE — Progress Notes (Signed)
 PT Cancellation Note  Patient Details Name: Nina Perez MRN: [id number] DOB: 05-Mar-1941   Cancelled Treatment:    Reason Eval/Treat Not Completed: Other (comment).  Pt noted with worsening NIH score. Neurology placed order for MRI T-spine.  Will monitor pt's status and re-attempt PT session at a later date/time.  Hendricks Limes, PT 07/27/23, 11:38 AM

## 2023-07-27 NOTE — Progress Notes (Signed)
 OT Cancellation Note  Patient Details Name: Nina Perez MRN: [id number] DOB: Aug 11, 1940   Cancelled Treatment:    Reason Eval/Treat Not Completed: Other (comment). Chart reviewed. Pt noted with worsening NIH score. Neurology placed order for MRI T-spine. Will continue to follow and intervene as medically appropriate pending additional work up and plan of care.   Arman Filter., MPH, MS, OTR/L ascom [phone] 07/27/23, 11:22 AM

## 2023-07-27 NOTE — Progress Notes (Signed)
 NEUROLOGY CONSULT FOLLOW UP NOTE   Date of service: July 27, 2023 Patient Name: Nina Perez MRN:  [id number] DOB:  Feb 28, 1941  Interval Hx/subjective   I was asked to revisit given worsening NIH. Vitals   Vitals:   07/27/23 0016 07/27/23 0404 07/27/23 0517 07/27/23 0804  BP: 123/67 129/61  127/64  Pulse: 68 71  69  Resp: 16 16  17   Temp: 98.6 F (37 C) 98.4 F (36.9 C)  97.8 F (36.6 C)  TempSrc: Oral Oral    SpO2: 96% 96%  98%  Weight:   91.9 kg   Height:         Body mass index is 32.7 kg/m.  Physical Exam   Constitutional: Appears well-developed and well-nourished.  Neurologic Examination    MS: Awake, alert, interactive and appropriate CN: Visual fields full, EOMI, face symmetric Motor: She is able to hold her right arm aloft, her left arm she has proximal deltoid weakness and is unable to keep it aloft but better strength distally.  In her legs, she has good strength distally, but is unable to hold either leg against gravity Sensory: Intact to temperature and light touch  NIHSS score: 6 1A: Level of Consciousness -  1B: Ask Month and Age -  1C: 'Blink Eyes' & 'Squeeze Hands' -  2: Test Horizontal Extraocular Movements -  3: Test Visual Fields -  4: Test Facial Palsy -  5A: Test Left Arm Motor Drift - 2 5B: Test Right Arm Motor Drift - 0 6A: Test Left Leg Motor Drift - 2 6B: Test Right Leg Motor Drift - 2 7: Test Limb Ataxia - 0 8: Test Sensation - 0 9: Test Language/Aphasia- 0 10: Test Dysarthria - 0 11: Test Extinction/Inattention - 0    Medications  Current Facility-Administered Medications:    acetaminophen (TYLENOL) tablet 650 mg, 650 mg, Oral, Q4H PRN, 650 mg at 07/25/23 0608 **OR** acetaminophen (TYLENOL) 160 MG/5ML solution 650 mg, 650 mg, Per Tube, Q4H PRN **OR** acetaminophen (TYLENOL) suppository 650 mg, 650 mg, Rectal, Q4H PRN, Lorretta Harp, MD   albuterol (PROVENTIL) (2.5 MG/3ML) 0.083% nebulizer solution 3 mL, 3 mL, Inhalation, Q4H PRN,  Lorretta Harp, MD   allopurinol (ZYLOPRIM) tablet 100 mg, 100 mg, Oral, Daily, Lorretta Harp, MD, 100 mg at 07/26/23 0900   amLODipine (NORVASC) tablet 5 mg, 5 mg, Oral, Daily, Gillis Santa, MD, 5 mg at 07/26/23 0900   aspirin EC tablet 81 mg, 81 mg, Oral, Daily, Lorretta Harp, MD, 81 mg at 07/26/23 0859   atorvastatin (LIPITOR) tablet 40 mg, 40 mg, Oral, Daily, Lorretta Harp, MD, 40 mg at 07/26/23 0859   bisacodyl (DULCOLAX) EC tablet 10 mg, 10 mg, Oral, QHS, Gillis Santa, MD, 10 mg at 07/24/23 2150   bisacodyl (DULCOLAX) suppository 10 mg, 10 mg, Rectal, Daily PRN, Gillis Santa, MD   clopidogrel (PLAVIX) tablet 75 mg, 75 mg, Oral, Q breakfast, Lorretta Harp, MD, 75 mg at 07/26/23 0900   dextromethorphan-guaiFENesin (MUCINEX DM) 30-600 MG per 12 hr tablet 1 tablet, 1 tablet, Oral, BID PRN, Lorretta Harp, MD   enoxaparin (LOVENOX) injection 40 mg, 40 mg, Subcutaneous, Q24H, Lorretta Harp, MD, 40 mg at 07/26/23 2310   folic acid (FOLVITE) tablet 1 mg, 1 mg, Oral, QPM, Lorretta Harp, MD, 1 mg at 07/26/23 1712   hydrALAZINE (APRESOLINE) injection 5 mg, 5 mg, Intravenous, Q2H PRN, Lorretta Harp, MD   insulin aspart (novoLOG) injection 0-5 Units, 0-5 Units, Subcutaneous, QHS, Lorretta Harp, MD  insulin aspart (novoLOG) injection 0-9 Units, 0-9 Units, Subcutaneous, TID WC, Lorretta Harp, MD, 1 Units at 07/26/23 1222   loratadine (CLARITIN) tablet 10 mg, 10 mg, Oral, Daily PRN, Lorretta Harp, MD   magnesium oxide (MAG-OX) tablet 400 mg, 400 mg, Oral, BID, Gillis Santa, MD, 400 mg at 07/26/23 2309   methocarbamol (ROBAXIN) tablet 500 mg, 500 mg, Oral, Q8H PRN, Lorretta Harp, MD, 500 mg at 07/26/23 1955   methotrexate (RHEUMATREX) tablet 10 mg, 10 mg, Oral, Billie Ruddy, Brien Few, MD, 10 mg at 07/22/23 1159   metoprolol succinate (TOPROL-XL) 24 hr tablet 25 mg, 25 mg, Oral, Daily, Gillis Santa, MD, 25 mg at 07/26/23 0900   nitroGLYCERIN (NITROSTAT) SL tablet 0.4 mg, 0.4 mg, Sublingual, Q5 Min x 3 PRN, Lorretta Harp, MD   ondansetron [facility])  injection 4 mg, 4 mg, Intravenous, Q8H PRN, Lorretta Harp, MD   oxyCODONE (Oxy IR/ROXICODONE) immediate release tablet 2.5 mg, 2.5 mg, Oral, Q6H PRN, Gillis Santa, MD, 2.5 mg at 07/26/23 1954   polyethylene glycol (MIRALAX / GLYCOLAX) packet 17 g, 17 g, Oral, BID, Gillis Santa, MD, 17 g at 07/26/23 0859   senna-docusate (Senokot-S) tablet 1 tablet, 1 tablet, Oral, QHS PRN, Lorretta Harp, MD   [COMPLETED] sodium chloride tablet 2 g, 2 g, Oral, TID WC, 2 g at 07/25/23 1649 **FOLLOWED BY** sodium chloride tablet 1 g, 1 g, Oral, TID WC, Gillis Santa, MD, 1 g at 07/26/23 1712  Labs and Diagnostic Imaging    Imaging(Personally reviewed): MRI brain-multiple small punctate infarcts MRI cervical spine, there is some stenosis, but no severe canal stenosis, she does have foraminal stenosis at C4-5 and five six  Assessment   Nina Perez is a 83 y.o. female with worsening lower extremity weakness and left arm weakness/pain that been progressive over the past couple of months.  I do not think her strokes adequately explain her presentation, and I do wonder if radiculopathy is playing some role in her left arm weakness, versus limitation due to pain from fall.   She has had left leg weakness for quite some time, but now has noticed that her right leg has been getting weaker as well.  Her husband notes that she was dragging her right leg more just prior to hospitalization.  She has preserved reflexes, I do not think autoimmune neuropathy (e.g. Guillain-Barr) is at all likely.  Recommendations  MRI thoracic spine CK If the above are negative, then the neck step would be outpatient EMG/nerve conduction study. ______________________________________________________________________   Stormy Card, MD [facility]

## 2023-07-27 NOTE — Progress Notes (Signed)
 Per Dr Lucianne Muss, dc stroke scale/NIH orders

## 2023-07-28 DIAGNOSIS — I6340 Cerebral infarction due to embolism of unspecified cerebral artery: Secondary | ICD-10-CM | POA: Diagnosis not present

## 2023-07-28 LAB — BASIC METABOLIC PANEL
Anion gap: 5 (ref 5–15)
BUN: 26 mg/dL — ABNORMAL HIGH (ref 8–23)
CO2: 23 mmol/L (ref 22–32)
Calcium: 10.4 mg/dL — ABNORMAL HIGH (ref 8.9–10.3)
Chloride: 104 mmol/L (ref 98–111)
Creatinine, Ser: 0.76 mg/dL (ref 0.44–1.00)
GFR, Estimated: >60 mL/min (ref >60)
Glucose, Bld: 141 mg/dL — ABNORMAL HIGH (ref 70–99)
Potassium: 5 mmol/L (ref 3.5–5.1)
Sodium: 132 mmol/L — ABNORMAL LOW (ref 135–145)

## 2023-07-28 LAB — GLUCOSE, CAPILLARY
Glucose-Capillary: 75 mg/dL (ref 70–99)
Glucose-Capillary: 111 mg/dL — ABNORMAL HIGH (ref 70–99)
Glucose-Capillary: 106 mg/dL — ABNORMAL HIGH (ref 70–99)
Glucose-Capillary: 82 mg/dL (ref 70–99)

## 2023-07-28 NOTE — Telephone Encounter (Signed)
 Faxed to [employer]

## 2023-07-28 NOTE — Progress Notes (Signed)
 Pt stated she does not wear CPAP unit. No unit in room

## 2023-07-28 NOTE — Progress Notes (Signed)
 Progress Note   Patient: Nina Perez UUV:[id number] DOB: [birth year] DOA: 07/18/2023     10 DOS: the patient was seen and examined on 07/28/2023   Brief hospital course: 83 y.o. female with medical history significant of HTN, HLD, DM, PVD, CAD with stent, dCHF, gout, RA, scleroderma, mild pulmonary fibrosis, pulmonary hypertension, left bundle blockage, CKD 3A, who presents with fall and weakness, urinary frequency.  Subsequently found to have BL CVAs.  Assessment and Plan:  Acute bilateral CVA - Likely contributing etiology to patient's weakness.   MRI noting punctate acute infarcts in bilateral frontal/parietal lobes and cerebellum.  Obvious concern for embolic etiology.  CT angio unremarkable.  Echo LVEF 55 to 60%, moderate LV hypertrophy, grade 1 diastolic dysfunction LDL 32, below goal 70, A1c 6.1, below goal 7%, TSH 4.1 WNL Neuro consulted, recommended to continue DAPT due to recent cardiac stent, continue statin.  30 day Cardiac monitor on discharge Informed to [facility] for cardiac monitor PT/OT evaluation for possible SNF placement MRI C-spine due to persistent weakness as bellow  3/17 physical debility due to laying in bed, seen again by Neuro Rec T-spine MRI but refused.   Cervical stenosis MRI C-spine: Mild spinal canal stenosis at C4-5 and C5-6. Severe left C4-5 and C5-6 neural foraminal stenosis.  Neurology recommended that neuroforaminal stenosis could be contributing to some of her left upper extremity weakness, given pain and effort limitations on exam would need EMG/nerve conduction study to confirm which could be completed outpatient if desired by patient/family.  3/16 Neurosurgery consulted, recommended no surgical intervention, left arm weakness is due to stroke, less likely due to cervical stenosis.  No surgical intervention.  May follow-up as an outpatient.    Pyuria, UTI ruled out Patient denied any dysuria, no UTI symptoms urine culture growing multiple  species, suggestive reticulation  WBC within normal range S/p ceftriaxone and Levaquin DC'd on 3/10 Monitor off antibiotics for now  CAD s/p cardiac stent, slightly elevated troponin resumed DAPT with statin medications.   No chest pain, shortness of breath to suggest ACS.  Diabetes mellitus - Insulin sliding scale on board.  Hypertension Resumed Toprol xl 25 mg pod and Amlodipine 5 mg po daily Monitor BP and titrate medications accordingly  Chronic HFpEF Well compensated Continue Toprol xl 25 mg po daily   Peripheral vascular disease - Aspirin, Plavix, Lipitor on board.  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  Nebulizers as needed.  Hyponatremia possibly due to nutritional deficiency Serum osmolarity 281 within normal range Na 128--130--132 Started Salt tabs  Hypomagnesemia, mag repleted. Monitor electrolytes  Obesity, class I BMI 31.7 Encouraged weight loss, cardiac/diabetic diet.  Gout - Does not appear to be acutely exacerbated.  Allopurinol on board. Uric acid level 5.1 wnl  Constipation, started laxatives.  Suppository x 1 given on 3/12, Use enema if no BM  3/14 Dulcolax suppository x 1 dose Pt had a BM on 3/16 3/16 Dulcolax suppository one-time dose   DVT prophylaxis: Lovenox [place]   Active Pressure Injury/Wound(s)     Pressure Ulcer  Duration          Pressure Injury 04/11/23 Heel Left Stage 1 -  Intact skin with non-blanchable redness of a localized area usually over a bony prominence. 100 days            Subjective: No significant events overnight, persistent weakness in the left arm, does not feel he any improvement but does not feel worsening as well. Patient  was able to work with PT yesterday and able to stand up.   Physical Exam: Vitals:   07/28/23 0021 07/28/23 0417 07/28/23 0824 07/28/23 1154  BP: (!) 143/64 (!) 127/57  137/67  Pulse: 69 62  64  Resp: 16 14  19   Temp: 98 F  (36.7 C) 98 F (36.7 C)  98.1 F (36.7 C)  TempSrc:      SpO2: 94% 93%  95%  Weight:   92.9 kg   Height:       GENERAL:  Alert, pleasant, no acute distress  HEENT:  EOMI CARDIOVASCULAR:  RRR, no murmurs appreciated RESPIRATORY:  Clear to auscultation, no wheezing, rales, or rhonchi GASTROINTESTINAL:  Soft, nontender, nondistended EXTREMITIES:  No LE edema bilaterally NEURO: CN II through XII grossly intact, left arm weak power 4/5, no any other focal deficits SKIN:  No rashes noted PSYCH:  Appropriate mood and affect   Data Reviewed:  There are no new results to review at this time.  Family Communication: Husband at bedside  Disposition: Status is: Inpatient Remains inpatient appropriate because: Acute CVA  Planned Discharge Destination: Skilled nursing facility    Patient is stable to discharge to SNF when bed will be available.  Time spent: 35 minutes  Author: Gillis Santa, MD 07/28/2023 3:03 PM  For on call review [email or web address].

## 2023-07-28 NOTE — Plan of Care (Signed)
 Problem: Education: Goal: Ability to describe self-care measures that may prevent or decrease complications (Diabetes Survival Skills Education) will improve 07/28/2023 0511 by Jorge Ny, RN Outcome: Progressing 07/28/2023 0240 by Jorge Ny, RN Outcome: Progressing Goal: Individualized Educational Video(s) 07/28/2023 0511 by Jorge Ny, RN Outcome: Progressing 07/28/2023 0240 by Jorge Ny, RN Outcome: Progressing   Problem: Coping: Goal: Ability to adjust to condition or change in health will improve 07/28/2023 0511 by Jorge Ny, RN Outcome: Progressing 07/28/2023 0240 by Jorge Ny, RN Outcome: Progressing   Problem: Fluid Volume: Goal: Ability to maintain a balanced intake and output will improve 07/28/2023 0511 by Jorge Ny, RN Outcome: Progressing 07/28/2023 0240 by Jorge Ny, RN Outcome: Progressing   Problem: Health Behavior/Discharge Planning: Goal: Ability to identify and utilize available resources and services will improve 07/28/2023 0511 by Jorge Ny, RN Outcome: Progressing 07/28/2023 0240 by Jorge Ny, RN Outcome: Progressing Goal: Ability to manage health-related needs will improve 07/28/2023 0511 by Jorge Ny, RN Outcome: Progressing 07/28/2023 0240 by Jorge Ny, RN Outcome: Progressing   Problem: Metabolic: Goal: Ability to maintain appropriate glucose levels will improve 07/28/2023 0511 by Jorge Ny, RN Outcome: Progressing 07/28/2023 0240 by Jorge Ny, RN Outcome: Progressing   Problem: Nutritional: Goal: Maintenance of adequate nutrition will improve 07/28/2023 0511 by Jorge Ny, RN Outcome: Progressing 07/28/2023 0240 by Jorge Ny, RN Outcome: Progressing Goal: Progress toward achieving an optimal weight will improve 07/28/2023 0511 by Jorge Ny, RN Outcome: Progressing 07/28/2023 0240 by Jorge Ny, RN Outcome:  Progressing   Problem: Skin Integrity: Goal: Risk for impaired skin integrity will decrease 07/28/2023 0511 by Jorge Ny, RN Outcome: Progressing 07/28/2023 0240 by Jorge Ny, RN Outcome: Progressing   Problem: Tissue Perfusion: Goal: Adequacy of tissue perfusion will improve 07/28/2023 0511 by Jorge Ny, RN Outcome: Progressing 07/28/2023 0240 by Jorge Ny, RN Outcome: Progressing   Problem: Education: Goal: Knowledge of disease or condition will improve 07/28/2023 0511 by Jorge Ny, RN Outcome: Progressing 07/28/2023 0240 by Jorge Ny, RN Outcome: Progressing Goal: Knowledge of secondary prevention will improve (MUST DOCUMENT ALL) 07/28/2023 0511 by Jorge Ny, RN Outcome: Progressing 07/28/2023 0240 by Jorge Ny, RN Outcome: Progressing Goal: Knowledge of patient specific risk factors will improve (DELETE if not current risk factor) 07/28/2023 0511 by Jorge Ny, RN Outcome: Progressing 07/28/2023 0240 by Jorge Ny, RN Outcome: Progressing   Problem: Ischemic Stroke/TIA Tissue Perfusion: Goal: Complications of ischemic stroke/TIA will be minimized 07/28/2023 0511 by Jorge Ny, RN Outcome: Progressing 07/28/2023 0240 by Jorge Ny, RN Outcome: Progressing   Problem: Coping: Goal: Will verbalize positive feelings about self 07/28/2023 0511 by Jorge Ny, RN Outcome: Progressing 07/28/2023 0240 by Jorge Ny, RN Outcome: Progressing Goal: Will identify appropriate support needs 07/28/2023 0511 by Jorge Ny, RN Outcome: Progressing 07/28/2023 0240 by Jorge Ny, RN Outcome: Progressing   Problem: Health Behavior/Discharge Planning: Goal: Ability to manage health-related needs will improve 07/28/2023 0511 by Jorge Ny, RN Outcome: Progressing 07/28/2023 0240 by Jorge Ny, RN Outcome: Progressing Goal: Goals will be collaboratively established  with patient/family 07/28/2023 0511 by Jorge Ny, RN Outcome: Progressing 07/28/2023 0240 by Jorge Ny, RN Outcome: Progressing   Problem: Self-Care: Goal: Ability to participate in self-care as condition permits will improve 07/28/2023 0511 by Jorge Ny, RN Outcome: Progressing 07/28/2023  69 by Jorge Ny, RN Outcome: Progressing Goal: Verbalization of feelings and concerns over difficulty with self-care will improve 07/28/2023 0511 by Jorge Ny, RN Outcome: Progressing 07/28/2023 0240 by Jorge Ny, RN Outcome: Progressing Goal: Ability to communicate needs accurately will improve 07/28/2023 0511 by Jorge Ny, RN Outcome: Progressing 07/28/2023 0240 by Jorge Ny, RN Outcome: Progressing   Problem: Nutrition: Goal: Risk of aspiration will decrease 07/28/2023 0511 by Jorge Ny, RN Outcome: Progressing 07/28/2023 0240 by Jorge Ny, RN Outcome: Progressing Goal: Dietary intake will improve 07/28/2023 0511 by Jorge Ny, RN Outcome: Progressing 07/28/2023 0240 by Jorge Ny, RN Outcome: Progressing   Problem: Education: Goal: Knowledge of General Education information will improve Description: Including pain rating scale, medication(s)/side effects and non-pharmacologic comfort measures 07/28/2023 0511 by Jorge Ny, RN Outcome: Progressing 07/28/2023 0240 by Jorge Ny, RN Outcome: Progressing   Problem: Health Behavior/Discharge Planning: Goal: Ability to manage health-related needs will improve 07/28/2023 0511 by Jorge Ny, RN Outcome: Progressing 07/28/2023 0240 by Jorge Ny, RN Outcome: Progressing   Problem: Clinical Measurements: Goal: Ability to maintain clinical measurements within normal limits will improve 07/28/2023 0511 by Jorge Ny, RN Outcome: Progressing 07/28/2023 0240 by Jorge Ny, RN Outcome: Progressing Goal: Will remain  free from infection 07/28/2023 0511 by Jorge Ny, RN Outcome: Progressing 07/28/2023 0240 by Jorge Ny, RN Outcome: Progressing Goal: Diagnostic test results will improve 07/28/2023 0511 by Jorge Ny, RN Outcome: Progressing 07/28/2023 0240 by Jorge Ny, RN Outcome: Progressing Goal: Respiratory complications will improve 07/28/2023 0511 by Jorge Ny, RN Outcome: Progressing 07/28/2023 0240 by Jorge Ny, RN Outcome: Progressing Goal: Cardiovascular complication will be avoided 07/28/2023 0511 by Jorge Ny, RN Outcome: Progressing 07/28/2023 0240 by Jorge Ny, RN Outcome: Progressing   Problem: Activity: Goal: Risk for activity intolerance will decrease 07/28/2023 0511 by Jorge Ny, RN Outcome: Progressing 07/28/2023 0240 by Jorge Ny, RN Outcome: Progressing   Problem: Nutrition: Goal: Adequate nutrition will be maintained 07/28/2023 0511 by Jorge Ny, RN Outcome: Progressing 07/28/2023 0240 by Jorge Ny, RN Outcome: Progressing   Problem: Coping: Goal: Level of anxiety will decrease 07/28/2023 0511 by Jorge Ny, RN Outcome: Progressing 07/28/2023 0240 by Jorge Ny, RN Outcome: Progressing   Problem: Elimination: Goal: Will not experience complications related to bowel motility 07/28/2023 0511 by Jorge Ny, RN Outcome: Progressing 07/28/2023 0240 by Jorge Ny, RN Outcome: Progressing Goal: Will not experience complications related to urinary retention 07/28/2023 0511 by Jorge Ny, RN Outcome: Progressing 07/28/2023 0240 by Jorge Ny, RN Outcome: Progressing   Problem: Education: Goal: Knowledge of disease or condition will improve 07/28/2023 0511 by Jorge Ny, RN Outcome: Progressing 07/28/2023 0240 by Jorge Ny, RN Outcome: Progressing Goal: Knowledge of secondary prevention will improve (MUST DOCUMENT ALL) 07/28/2023  0511 by Jorge Ny, RN Outcome: Progressing 07/28/2023 0240 by Jorge Ny, RN Outcome: Progressing Goal: Knowledge of patient specific risk factors will improve (DELETE if not current risk factor) 07/28/2023 0511 by Jorge Ny, RN Outcome: Progressing 07/28/2023 0240 by Jorge Ny, RN Outcome: Progressing   Problem: Ischemic Stroke/TIA Tissue Perfusion: Goal: Complications of ischemic stroke/TIA will be minimized 07/28/2023 0511 by Jorge Ny, RN Outcome: Progressing 07/28/2023 0240 by Jorge Ny, RN Outcome: Progressing   Problem: Coping: Goal: Will verbalize positive feelings about self 07/28/2023 0511 by  Jorge Ny, RN Outcome: Progressing 07/28/2023 0240 by Jorge Ny, RN Outcome: Progressing Goal: Will identify appropriate support needs 07/28/2023 0511 by Jorge Ny, RN Outcome: Progressing 07/28/2023 0240 by Jorge Ny, RN Outcome: Progressing   Problem: Health Behavior/Discharge Planning: Goal: Ability to manage health-related needs will improve 07/28/2023 0511 by Jorge Ny, RN Outcome: Progressing 07/28/2023 0240 by Jorge Ny, RN Outcome: Progressing Goal: Goals will be collaboratively established with patient/family 07/28/2023 0511 by Jorge Ny, RN Outcome: Progressing 07/28/2023 0240 by Jorge Ny, RN Outcome: Progressing   Problem: Self-Care: Goal: Ability to participate in self-care as condition permits will improve 07/28/2023 0511 by Jorge Ny, RN Outcome: Progressing 07/28/2023 0240 by Jorge Ny, RN Outcome: Progressing Goal: Verbalization of feelings and concerns over difficulty with self-care will improve 07/28/2023 0511 by Jorge Ny, RN Outcome: Progressing 07/28/2023 0240 by Jorge Ny, RN Outcome: Progressing Goal: Ability to communicate needs accurately will improve 07/28/2023 0511 by Jorge Ny, RN Outcome:  Progressing 07/28/2023 0240 by Jorge Ny, RN Outcome: Progressing   Problem: Nutrition: Goal: Risk of aspiration will decrease 07/28/2023 0511 by Jorge Ny, RN Outcome: Progressing 07/28/2023 0240 by Jorge Ny, RN Outcome: Progressing Goal: Dietary intake will improve 07/28/2023 0511 by Jorge Ny, RN Outcome: Progressing 07/28/2023 0240 by Jorge Ny, RN Outcome: Progressing

## 2023-07-28 NOTE — TOC Initial Note (Signed)
 Transition of Care [facility]) - Initial/Assessment Note    Patient Details  Name: Nina Perez MRN: [id number] Date of Birth: 02-Apr-1941  Transition [facility]) CM/SW Contact:    Cherre Blanc, RN Phone Number: 07/28/2023, 3:27 PM  Clinical Narrative:                 Patient lives at home with her husband. She is independent with her medications and noted to have a pcp. She has wheelchair, RW, cane, and grab bars in the shower. Her husband drives her. Plan is for patient to DC to SNF for short term inpatient rehab. The patient and family prefer [facility]. TOC will send FL2 and continue to follow.    Expected Discharge Plan: Skilled Nursing Facility Barriers to Discharge: SNF Pending bed offer   Patient Goals and CMS Choice            Expected Discharge Plan and Services   Discharge Planning Services: CM Consult   Living arrangements for the past 2 months: Skilled Nursing Facility                                      Prior Living Arrangements/Services Living arrangements for the past 2 months: Skilled Nursing Facility Lives with:: Spouse Patient language and need for interpreter reviewed:: Yes        Need for Family Participation in Patient Care: Yes (Comment) Care giver support system in place?: Yes (comment)   Criminal Activity/Legal Involvement Pertinent to Current Situation/Hospitalization: No - Comment as needed  Activities of Daily Living   ADL Screening (condition at time of admission) Independently performs ADLs?: No Does the patient have a NEW difficulty with bathing/dressing/toileting/self-feeding that is expected to last >3 days?: Yes (Initiates electronic notice to provider for possible OT consult) Does the patient have a NEW difficulty with getting in/out of bed, walking, or climbing stairs that is expected to last >3 days?: Yes (Initiates electronic notice to provider for possible PT consult) Does the patient have a NEW difficulty with  communication that is expected to last >3 days?: No Is the patient deaf or have difficulty hearing?: No Does the patient have difficulty seeing, even when wearing glasses/contacts?: No Does the patient have difficulty concentrating, remembering, or making decisions?: No  Permission Sought/Granted   Permission granted to share information with : Yes, Verbal Permission Granted  Share Information with NAME: Munos,Ronald (Spouse)  [phone] (Mobile)           Emotional Assessment Appearance:: Appears stated age Attitude/Demeanor/Rapport: Engaged Affect (typically observed): Appropriate Orientation: : Oriented to Self, Oriented to Place, Oriented to  Time, Oriented to Situation Alcohol / Substance Use: Not Applicable Psych Involvement: No (comment)  Admission diagnosis:  Right arm weakness [R29.898] Left arm weakness [R29.898] Closed right hip fracture (HCC) [S72.001A] Fall, initial encounter [W19.XXXA] Patient Active Problem List   Diagnosis Date Noted   Cervical spondylosis without myelopathy 07/26/2023   Fall 07/26/2023   Right arm weakness 07/18/2023   Type II diabetes mellitus with renal manifestations (HCC) 07/18/2023   Myocardial injury 07/18/2023   PVD (peripheral vascular disease) (HCC) 07/18/2023   Chronic diastolic CHF (congestive heart failure) (HCC) 07/18/2023   Chronic kidney disease, stage 3a (HCC) 07/18/2023   IPF (idiopathic pulmonary fibrosis) (HCC) 07/18/2023   Fall at home, initial encounter 07/18/2023   Left arm weakness 07/18/2023   Stroke (HCC) 07/18/2023   Open wound  of knee, left, subsequent encounter 06/07/2023   Hyponatremia 04/08/2023   History of non-ST elevation myocardial infarction (NSTEMI) 04/07/2023   Acute respiratory failure with hypoxia (HCC) 04/07/2023   Cellulitis of knee, left 04/07/2023   Acute kidney injury superimposed on CKD (HCC) 04/07/2023   Cellulitis of left knee 04/07/2023   UTI (urinary tract infection) 04/06/2023    Generalized weakness 04/06/2023   SOB (shortness of breath) on exertion 03/02/2023   Lower extremity cellulitis 03/02/2023   Left hip pain 03/02/2023   Positive self-administered antigen test for COVID-19 01/14/2023   CAD (coronary artery disease) 12/13/2022   Dyslipidemia 11/22/2022   Essential hypertension 11/22/2022   NSTEMI (non-ST elevated myocardial infarction) (HCC) 11/21/2022   Cardiomyopathy (HCC) 11/17/2022   Balance problem 11/17/2022   Anemia in chronic kidney disease 06/24/2022   Congestive heart failure (HCC) 06/24/2022   Idiopathic pulmonary fibrosis (HCC) 06/24/2022   Proteinuria, unspecified 06/24/2022   Ear lesion 04/20/2022   Change in bowel movement 04/20/2022   History of rectal bleeding 11/13/2021   Total knee replacement status 11/13/2021   Knee pain 08/21/2021   Occult blood positive stool 07/04/2021   Rectal bleeding 06/26/2021   Redness of skin 04/09/2021   Aortic atherosclerosis (HCC) 01/20/2021   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   Nocturia 12/18/2020   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) 10/08/2020   Abnormal ECG 08/06/2020   Premature beats 07/03/2020   Dysuria 12/11/2019   Adenomatous colon polyp 11/21/2019   Phlebitis 11/21/2019   Renal cyst 11/21/2019   Swelling of lower extremity 10/26/2019   Hypomagnesemia 06/03/2019   CKD (chronic kidney disease) stage 3, GFR 30-59 ml/min (HCC) 06/02/2019   Atherosclerosis of native arteries of extremity with rest pain (HCC) 05/29/2019   Critical lower limb ischemia (HCC) 05/16/2019   Open wound of left foot 03/27/2019   Decreased GFR 11/13/2018   Bladder incontinence 11/13/2018   Elevated TSH 11/13/2018   Thrombocytopenia (HCC) 07/03/2018   Acute right-sided thoracic back pain 06/16/2018   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   Pulmonary hypertension, mild (HCC) 03/04/2018   Murmur 08/16/2017   Lymphedema 03/04/2016   Lesion of neck 12/15/2015   Dizziness 12/15/2015   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 09/10/2015   Varicose veins of right lower extremity with inflammation 09/10/2015   Breast nodule 11/05/2014   Health care maintenance 11/02/2014   Obesity (BMI 30-39.9) 06/04/2014   Osteoarthritis 02/27/2014   Environmental allergies 01/15/2014   Type 2 diabetes mellitus with hyperlipidemia (HCC) 05/22/2012   Hypertension 05/22/2012   Hypercholesterolemia 05/22/2012   Rheumatoid arthritis (HCC) 05/22/2012   Barrett's esophagus 05/22/2012   Gout 05/22/2012   GERD (gastroesophageal reflux disease) 05/22/2012   History of colon polyps 05/22/2012   Type 2 diabetes mellitus without complications (HCC) 05/22/2012   PCP:  Dale Newtonia, MD Pharmacy:   [facility] [address], [place] - [address] [place] [address] Phone: [phone] Fax: [phone]  [facility] - [place], [place] - [address] [place] [address] Phone: [phone] Fax: [phone]  [facility] [address] [place] [address] Phone: [phone] Fax: [phone]     Social Drivers of Health (SDOH) Social History: SDOH Screenings   Food Insecurity: No Food Insecurity (07/20/2023)  Housing: Low Risk  (07/20/2023)  Transportation Needs: No Transportation Needs (07/20/2023)  Utilities: Not At Risk (07/20/2023)  Alcohol Screen: Low Risk  (  01/15/2023)  Depression (PHQ2-9): Low Risk  (02/04/2023)  Financial Resource Strain: Low Risk  (01/15/2023)  Physical Activity: Unknown (01/15/2023)  Social Connections: Moderately Integrated (07/20/2023)  Stress: No Stress Concern Present (01/15/2023)  Tobacco Use: Medium Risk (07/20/2023)  Health Literacy: Adequate Health Literacy (01/15/2023)   SDOH Interventions:     Readmission Risk Interventions    04/08/2023    2:23 PM  Readmission Risk Prevention Plan  Transportation Screening Complete  PCP or Specialist  Appt within 3-5 Days Complete  HRI or Home Care Consult Complete  Medication Review (RN Care Manager) Complete

## 2023-07-28 NOTE — Plan of Care (Signed)
 Problem: Education: Goal: Ability to describe self-care measures that may prevent or decrease complications (Diabetes Survival Skills Education) will improve Outcome: Progressing Goal: Individualized Educational Video(s) Outcome: Progressing   Problem: Coping: Goal: Ability to adjust to condition or change in health will improve Outcome: Progressing   Problem: Fluid Volume: Goal: Ability to maintain a balanced intake and output will improve Outcome: Progressing   Problem: Health Behavior/Discharge Planning: Goal: Ability to identify and utilize available resources and services will improve Outcome: Progressing Goal: Ability to manage health-related needs will improve Outcome: Progressing   Problem: Metabolic: Goal: Ability to maintain appropriate glucose levels will improve Outcome: Progressing   Problem: Nutritional: Goal: Maintenance of adequate nutrition will improve Outcome: Progressing Goal: Progress toward achieving an optimal weight will improve Outcome: Progressing   Problem: Skin Integrity: Goal: Risk for impaired skin integrity will decrease Outcome: Progressing   Problem: Tissue Perfusion: Goal: Adequacy of tissue perfusion will improve Outcome: Progressing   Problem: Education: Goal: Knowledge of disease or condition will improve Outcome: Progressing Goal: Knowledge of secondary prevention will improve (MUST DOCUMENT ALL) Outcome: Progressing Goal: Knowledge of patient specific risk factors will improve (DELETE if not current risk factor) Outcome: Progressing   Problem: Ischemic Stroke/TIA Tissue Perfusion: Goal: Complications of ischemic stroke/TIA will be minimized Outcome: Progressing   Problem: Coping: Goal: Will verbalize positive feelings about self Outcome: Progressing Goal: Will identify appropriate support needs Outcome: Progressing   Problem: Health Behavior/Discharge Planning: Goal: Ability to manage health-related needs will  improve Outcome: Progressing Goal: Goals will be collaboratively established with patient/family Outcome: Progressing   Problem: Self-Care: Goal: Ability to participate in self-care as condition permits will improve Outcome: Progressing Goal: Verbalization of feelings and concerns over difficulty with self-care will improve Outcome: Progressing Goal: Ability to communicate needs accurately will improve Outcome: Progressing   Problem: Nutrition: Goal: Risk of aspiration will decrease Outcome: Progressing Goal: Dietary intake will improve Outcome: Progressing   Problem: Education: Goal: Knowledge of General Education information will improve Description: Including pain rating scale, medication(s)/side effects and non-pharmacologic comfort measures Outcome: Progressing   Problem: Health Behavior/Discharge Planning: Goal: Ability to manage health-related needs will improve Outcome: Progressing   Problem: Clinical Measurements: Goal: Ability to maintain clinical measurements within normal limits will improve Outcome: Progressing Goal: Will remain free from infection Outcome: Progressing Goal: Diagnostic test results will improve Outcome: Progressing Goal: Respiratory complications will improve Outcome: Progressing Goal: Cardiovascular complication will be avoided Outcome: Progressing   Problem: Activity: Goal: Risk for activity intolerance will decrease Outcome: Progressing   Problem: Nutrition: Goal: Adequate nutrition will be maintained Outcome: Progressing   Problem: Coping: Goal: Level of anxiety will decrease Outcome: Progressing   Problem: Elimination: Goal: Will not experience complications related to bowel motility Outcome: Progressing Goal: Will not experience complications related to urinary retention Outcome: Progressing   Problem: Education: Goal: Knowledge of disease or condition will improve Outcome: Progressing Goal: Knowledge of secondary  prevention will improve (MUST DOCUMENT ALL) Outcome: Progressing Goal: Knowledge of patient specific risk factors will improve (DELETE if not current risk factor) Outcome: Progressing   Problem: Ischemic Stroke/TIA Tissue Perfusion: Goal: Complications of ischemic stroke/TIA will be minimized Outcome: Progressing   Problem: Coping: Goal: Will verbalize positive feelings about self Outcome: Progressing Goal: Will identify appropriate support needs Outcome: Progressing   Problem: Health Behavior/Discharge Planning: Goal: Ability to manage health-related needs will improve Outcome: Progressing Goal: Goals will be collaboratively established with patient/family Outcome: Progressing   Problem: Self-Care: Goal: Ability to participate in  self-care as condition permits will improve Outcome: Progressing Goal: Verbalization of feelings and concerns over difficulty with self-care will improve Outcome: Progressing Goal: Ability to communicate needs accurately will improve Outcome: Progressing   Problem: Nutrition: Goal: Risk of aspiration will decrease Outcome: Progressing Goal: Dietary intake will improve Outcome: Progressing

## 2023-07-28 NOTE — Plan of Care (Signed)
 Problem: Education: Goal: Ability to describe self-care measures that may prevent or decrease complications (Diabetes Survival Skills Education) will improve Outcome: Progressing Goal: Individualized Educational Video(s) Outcome: Progressing   Problem: Coping: Goal: Ability to adjust to condition or change in health will improve Outcome: Progressing   Problem: Fluid Volume: Goal: Ability to maintain a balanced intake and output will improve Outcome: Progressing   Problem: Health Behavior/Discharge Planning: Goal: Ability to identify and utilize available resources and services will improve Outcome: Progressing Goal: Ability to manage health-related needs will improve Outcome: Progressing   Problem: Metabolic: Goal: Ability to maintain appropriate glucose levels will improve Outcome: Progressing   Problem: Nutritional: Goal: Maintenance of adequate nutrition will improve Outcome: Progressing Goal: Progress toward achieving an optimal weight will improve Outcome: Progressing   Problem: Skin Integrity: Goal: Risk for impaired skin integrity will decrease Outcome: Progressing   Problem: Tissue Perfusion: Goal: Adequacy of tissue perfusion will improve Outcome: Progressing   Problem: Education: Goal: Knowledge of disease or condition will improve Outcome: Progressing Goal: Knowledge of secondary prevention will improve (MUST DOCUMENT ALL) Outcome: Progressing Goal: Knowledge of patient specific risk factors will improve (DELETE if not current risk factor) Outcome: Progressing   Problem: Ischemic Stroke/TIA Tissue Perfusion: Goal: Complications of ischemic stroke/TIA will be minimized Outcome: Progressing   Problem: Coping: Goal: Will verbalize positive feelings about self Outcome: Progressing Goal: Will identify appropriate support needs Outcome: Progressing   Problem: Health Behavior/Discharge Planning: Goal: Ability to manage health-related needs will  improve Outcome: Progressing Goal: Goals will be collaboratively established with patient/family Outcome: Progressing   Problem: Self-Care: Goal: Ability to participate in self-care as condition permits will improve Outcome: Progressing Goal: Verbalization of feelings and concerns over difficulty with self-care will improve Outcome: Progressing Goal: Ability to communicate needs accurately will improve Outcome: Progressing   Problem: Nutrition: Goal: Risk of aspiration will decrease Outcome: Progressing Goal: Dietary intake will improve Outcome: Progressing   Problem: Education: Goal: Knowledge of General Education information will improve Description: Including pain rating scale, medication(s)/side effects and non-pharmacologic comfort measures Outcome: Progressing   Problem: Health Behavior/Discharge Planning: Goal: Ability to manage health-related needs will improve Outcome: Progressing   Problem: Clinical Measurements: Goal: Ability to maintain clinical measurements within normal limits will improve Outcome: Progressing Goal: Will remain free from infection Outcome: Progressing Goal: Diagnostic test results will improve Outcome: Progressing Goal: Respiratory complications will improve Outcome: Progressing Goal: Cardiovascular complication will be avoided Outcome: Progressing   Problem: Activity: Goal: Risk for activity intolerance will decrease Outcome: Progressing   Problem: Nutrition: Goal: Adequate nutrition will be maintained Outcome: Progressing   Problem: Education: Goal: Knowledge of disease or condition will improve Outcome: Progressing Goal: Knowledge of secondary prevention will improve (MUST DOCUMENT ALL) Outcome: Progressing Goal: Knowledge of patient specific risk factors will improve (DELETE if not current risk factor) Outcome: Progressing   Problem: Ischemic Stroke/TIA Tissue Perfusion: Goal: Complications of ischemic stroke/TIA will be  minimized Outcome: Progressing   Problem: Coping: Goal: Will verbalize positive feelings about self Outcome: Progressing Goal: Will identify appropriate support needs Outcome: Progressing   Problem: Health Behavior/Discharge Planning: Goal: Ability to manage health-related needs will improve Outcome: Progressing Goal: Goals will be collaboratively established with patient/family Outcome: Progressing   Problem: Self-Care: Goal: Ability to participate in self-care as condition permits will improve Outcome: Progressing Goal: Verbalization of feelings and concerns over difficulty with self-care will improve Outcome: Progressing Goal: Ability to communicate needs accurately will improve Outcome: Progressing   Problem: Nutrition: Goal: Risk  of aspiration will decrease Outcome: Progressing Goal: Dietary intake will improve Outcome: Progressing

## 2023-07-29 ENCOUNTER — Inpatient Hospital Stay: Admit: 2023-07-29 | Discharge: 2023-07-29 | Disposition: A | Discharge status: Home / Self Care | Attending: Medical

## 2023-07-29 DIAGNOSIS — I6340 Cerebral infarction due to embolism of unspecified cerebral artery: Secondary | ICD-10-CM | POA: Diagnosis not present

## 2023-07-29 DIAGNOSIS — I639 Cerebral infarction, unspecified: Secondary | ICD-10-CM

## 2023-07-29 LAB — BASIC METABOLIC PANEL
Anion gap: 5 (ref 5–15)
BUN: 23 mg/dL (ref 8–23)
CO2: 25 mmol/L (ref 22–32)
Calcium: 9.6 mg/dL (ref 8.9–10.3)
Chloride: 101 mmol/L (ref 98–111)
Creatinine, Ser: 0.71 mg/dL (ref 0.44–1.00)
GFR, Estimated: >60 mL/min (ref >60)
Glucose, Bld: 81 mg/dL (ref 70–99)
Potassium: 4.3 mmol/L (ref 3.5–5.1)
Sodium: 131 mmol/L — ABNORMAL LOW (ref 135–145)

## 2023-07-29 LAB — CBC
HCT: 33.6 % — ABNORMAL LOW (ref 36.0–46.0)
Hemoglobin: 10.9 g/dL — ABNORMAL LOW (ref 12.0–15.0)
MCH: 33 pg (ref 26.0–34.0)
MCHC: 32.4 g/dL (ref 30.0–36.0)
MCV: 101.8 fL — ABNORMAL HIGH (ref 80.0–100.0)
Platelets: 186 10*3/uL (ref 150–400)
RBC: 3.3 MIL/uL — ABNORMAL LOW (ref 3.87–5.11)
RDW: 16 % — ABNORMAL HIGH (ref 11.5–15.5)
WBC: 4.2 10*3/uL (ref 4.0–10.5)
nRBC: 0 % (ref 0.0–0.2)

## 2023-07-29 LAB — GLUCOSE, CAPILLARY
Glucose-Capillary: 86 mg/dL (ref 70–99)
Glucose-Capillary: 78 mg/dL (ref 70–99)

## 2023-07-29 LAB — PHOSPHORUS: Phosphorus: 2.6 mg/dL (ref 2.5–4.6)

## 2023-07-29 LAB — MAGNESIUM: Magnesium: 2 mg/dL (ref 1.7–2.4)

## 2023-07-29 MED ORDER — POLYETHYLENE GLYCOL 3350 17 G PO PACK: 17.0000 g | PACK | Freq: Two times a day (BID) | ORAL | Status: AC

## 2023-07-29 MED ORDER — SODIUM CHLORIDE 1 G PO TABS: 1.0000 g | ORAL_TABLET | Freq: Three times a day (TID) | ORAL | Status: AC

## 2023-07-29 MED ORDER — METHOCARBAMOL 500 MG PO TABS: 500.0000 mg | ORAL_TABLET | Freq: Three times a day (TID) | ORAL | Status: AC | PRN

## 2023-07-29 MED ORDER — BISACODYL 10 MG RE SUPP: 10.0000 mg | Freq: Every day | RECTAL | Status: AC | PRN

## 2023-07-29 MED ORDER — TRAMADOL HCL 50 MG PO TABS: 50.0000 mg | ORAL_TABLET | Freq: Three times a day (TID) | ORAL | 0 refills | Status: AC | PRN

## 2023-07-29 MED ORDER — BISACODYL 5 MG PO TBEC: 10.0000 mg | DELAYED_RELEASE_TABLET | Freq: Every day | ORAL | Status: AC

## 2023-07-29 MED ORDER — ATORVASTATIN CALCIUM 80 MG PO TABS: 40.0000 mg | ORAL_TABLET | Freq: Every day | ORAL | Status: DC

## 2023-07-29 MED ORDER — METOPROLOL SUCCINATE ER 25 MG PO TB24: 25.0000 mg | ORAL_TABLET | Freq: Every day | ORAL | Status: DC

## 2023-07-29 NOTE — Progress Notes (Signed)
 Physical Therapy Treatment Patient Details Name: Nina Perez MRN: [id number] DOB: Jun 24, 1940 Today's Date: 07/29/2023   History of Present Illness Patient is a 83 year old female presenting with generalized weakness and fall. 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 Recent rehab after hospitalization for cellulitis. History of aortic atherosclerosis, hypertension, interstitial lung disease, osteoarthritis, peripheral artery disease, pulmonary fibrosis, pulmonary hypertension, rheumatoid arthritis    PT Comments  Patient is agreeable to PT session. Supportive spouse at bedside. Patient continues to require assistance with bed mobility. +2 person assistance required for partial sit to stand transfer x 2 bouts. Cues for anterior weight shifting and technique to facilitate increased independence with standing. Limited activity tolerance. Recommend to continue PT to maximize independence and facilitate return to prior level of function. Rehabilitation < 3 hours/day recommended after this hospital stay.    If plan is discharge home, recommend the following: Two people to help with walking and/or transfers;A lot of help with bathing/dressing/bathroom;Assist for transportation;Help with stairs or ramp for entrance;Assistance with cooking/housework   Can travel by private vehicle     No  Equipment Recommendations  None recommended by PT    Recommendations for Other Services       Precautions / Restrictions Precautions Precautions: Fall Recall of Precautions/Restrictions: Intact Restrictions Weight Bearing Restrictions Per Provider Order: No     Mobility  Bed Mobility Overal bed mobility: Needs Assistance Bed Mobility: Supine to Sit, Sit to Supine     Supine to sit: Mod assist, +2 for physical assistance, HOB elevated Sit to supine: Mod assist, Max assist, +2 for physical assistance   General bed mobility comments: assistance required for trunk and  BLE support. verbal cues for technique and sequencing    Transfers Overall transfer level: Needs assistance Equipment used: Rolling walker (2 wheels) Transfers: Sit to/from Stand Sit to Stand: Max assist, +2 physical assistance           General transfer comment: 2 standing bouts performed where patient achieved partial stand. unable to get trunk fully upright despite faciliation and cues, but able to clear posterior buttocks from bed. cues for anterior weight shifting and for lift off    Ambulation/Gait               General Gait Details: unable to at this time   Stairs             Wheelchair Mobility     Tilt Bed    Modified Rankin (Stroke Patients Only)       Balance Overall balance assessment: Needs assistance, History of Falls Sitting-balance support: Bilateral upper extremity supported, Feet supported Sitting balance-Leahy Scale: Fair     Standing balance support: Bilateral upper extremity supported, During functional activity, Reliant on assistive device for balance Standing balance-Leahy Scale: Poor Standing balance comment: external support required +2 person with use of rolling walker                            Communication Communication Communication: No apparent difficulties  Cognition Arousal: Alert Behavior During Therapy: Anxious   PT - Cognitive impairments: No apparent impairments                         Following commands: Intact      Cueing Cueing Techniques: Verbal cues, Tactile cues  Exercises      General Comments General comments (skin integrity, edema, etc.):  Pt on room air. VSS throughout session. Husband in room throughout session; OT assisted husband in contacting case manager to discuss d/c plan. MD entering room at end of session as OT leaving. L arm positioned on pillow for support.      Pertinent Vitals/Pain Pain Assessment Pain Location: unrated pain in left leg and right shoulder  before movement with no pain reported after mobility Pain Descriptors / Indicators: Discomfort Pain Intervention(s): Limited activity within patient's tolerance, Monitored during session, Repositioned    Home Living                          Prior Function            PT Goals (current goals can now be found in the care plan section) Acute Rehab PT Goals Patient Stated Goal: to go to rehab to get stronger PT Goal Formulation: With patient Time For Goal Achievement: 08/03/23 Potential to Achieve Goals: Fair Progress towards PT goals: Progressing toward goals    Frequency    Min 2X/week      PT Plan      Co-evaluation PT/OT/SLP Co-Evaluation/Treatment: Yes Reason for Co-Treatment: Complexity of the patient's impairments (multi-system involvement);For patient/therapist safety PT goals addressed during session: Mobility/safety with mobility;Balance;Proper use of DME OT goals addressed during session: ADL's and self-care      AM-PAC PT "6 Clicks" Mobility   Outcome Measure  Help needed turning from your back to your side while in a flat bed without using bedrails?: A Lot Help needed moving from lying on your back to sitting on the side of a flat bed without using bedrails?: Total Help needed moving to and from a bed to a chair (including a wheelchair)?: Total Help needed standing up from a chair using your arms (e.g., wheelchair or bedside chair)?: Total Help needed to walk in hospital room?: Total Help needed climbing 3-5 steps with a railing? : Total 6 Click Score: 7    End of Session   Activity Tolerance: Patient limited by fatigue Patient left: in bed;with call bell/phone within reach;with bed alarm set;with family/visitor present Nurse Communication: Mobility status PT Visit Diagnosis: Muscle weakness (generalized) (M62.81);Unsteadiness on feet (R26.81)     Time: 4403-4742 PT Time Calculation (min) (ACUTE ONLY): 23 min  Charges:    $Therapeutic  Activity: 8-22 mins PT General Charges $$ ACUTE PT VISIT: 1 Visit                     Donna Bernard, PT, MPT    Ina Homes 07/29/2023, 12:44 PM

## 2023-07-29 NOTE — Progress Notes (Signed)
 Occupational Therapy Treatment Patient Details Name: Nina Perez MRN: [id number] DOB: 1940-09-28 Today's Date: 07/29/2023   History of present illness Patient is a 83 year old female presenting with generalized weakness and fall. 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 Recent rehab after hospitalization for cellulitis. History of aortic atherosclerosis, hypertension, interstitial lung disease, osteoarthritis, peripheral artery disease, pulmonary fibrosis, pulmonary hypertension, rheumatoid arthritis   OT comments  Pt received semi-reclined in bed, husband at bedside. Appearing sleepy but willing to work with OT on sitting EOB. T/f MOD A x2 to EOB. See flowsheet below for further details of session. Left semi-reclined in bed, LUE supported on pillow, husband at bedside, MD entering room, with all needs in reach.  Patient will benefit from continued OT while in acute care.       If plan is discharge home, recommend the following:  Two people to help with walking and/or transfers;A lot of help with bathing/dressing/bathroom;Assistance with cooking/housework;Assist for transportation;Help with stairs or ramp for entrance   Equipment Recommendations  Other (comment) (defer to next venue of care)    Recommendations for Other Services      Precautions / Restrictions Precautions Precautions: Fall Recall of Precautions/Restrictions: Intact Restrictions Weight Bearing Restrictions Per Provider Order: No       Mobility Bed Mobility Overal bed mobility: Needs Assistance Bed Mobility: Supine to Sit, Sit to Supine     Supine to sit: Mod assist, +2 for physical assistance, HOB elevated Sit to supine: Mod assist, Max assist, +2 for physical assistance   General bed mobility comments: requiring assist to reach LUE towards opposite rail 2/2 weakness. Significant overall weakness. Initially requiring MOD A sitting balance; quickly gained balance and CGA  sitting balance most of session with intermittent MIN A; R lean.    Transfers Overall transfer level: Needs assistance Equipment used: Rolling walker (2 wheels) Transfers: Sit to/from Stand Sit to Stand: Max assist, +2 physical assistance           General transfer comment: Pt practiced leaning forward first. Then MAX A x2 twice for sit to stand; did not achieve full upright position, but did clear back of buttocks off the EOB. Significant fear of falling.     Balance Overall balance assessment: Needs assistance, History of Falls Sitting-balance support: Bilateral upper extremity supported, Feet supported Sitting balance-Leahy Scale: Fair     Standing balance support: Bilateral upper extremity supported, During functional activity, Reliant on assistive device for balance Standing balance-Leahy Scale: Poor                             ADL either performed or assessed with clinical judgement   ADL Overall ADL's : Needs assistance/impaired     Grooming: Brushing hair;Sitting;Contact guard assist Grooming Details (indicate cue type and reason): CGA for sitting balance at EOB while pt brushing hair with RUE. Pt intermittently needing to stop the task to take a break and regain her sitting balance at EOB (R lean).  Pt declines oral care today at EOB.                               General ADL Comments: anticipate EOB/bed level ADLs, as pt not currently able to stand and pivot to [facility] even with x2 assist.    Extremity/Trunk Assessment Upper Extremity Assessment Upper Extremity Assessment: Generalized weakness;LUE deficits/detail LUE Deficits /  Details: increased weakness, especially at shoulder.   Lower Extremity Assessment Lower Extremity Assessment: Defer to PT evaluation;Generalized weakness        Vision       Perception     Praxis     Communication Communication Communication: No apparent difficulties   Cognition Arousal: Alert Behavior  During Therapy: Anxious Cognition: No apparent impairments             OT - Cognition Comments: Pt is able to follow all commands. Pt appears with flat affect and not very motivated; requires encouragement at each step for participation. Declines oral hygiene since "I did that last night."                 Following commands: Intact        Cueing   Cueing Techniques: Verbal cues, Tactile cues  Exercises      Shoulder Instructions       General Comments Pt on room air. VSS throughout session. Husband in room throughout session; OT assisted husband in contacting case manager to discuss d/c plan. MD entering room at end of session as OT leaving. L arm positioned on pillow for support.    Pertinent Vitals/ Pain       Pain Assessment Pain Assessment: 0-10 Pain Score:  (unrated) Pain Location: R shoulder and L knee at rest; unrated; but states at the end of session that the pain has gone away. Pain Descriptors / Indicators: Grimacing, Guarding Pain Intervention(s): Limited activity within patient's tolerance, Repositioned  Home Living                                          Prior Functioning/Environment              Frequency  Min 2X/week        Progress Toward Goals  OT Goals(current goals can now be found in the care plan section)  Progress towards OT goals: Progressing toward goals  Acute Rehab OT Goals Patient Stated Goal: Get better OT Goal Formulation: With patient/family Time For Goal Achievement: 08/02/23 Potential to Achieve Goals: Good ADL Goals Pt Will Perform Grooming: sitting;with min assist Pt Will Perform Lower Body Dressing: with adaptive equipment;with mod assist;sitting/lateral leans Pt Will Transfer to Toilet: bedside commode;with mod assist Pt Will Perform Toileting - Clothing Manipulation and hygiene: with min assist;with adaptive equipment  Plan      Co-evaluation    PT/OT/SLP Co-Evaluation/Treatment:  Yes Reason for Co-Treatment: Complexity of the patient's impairments (multi-system involvement);For patient/therapist safety   OT goals addressed during session: ADL's and self-care      AM-PAC OT "6 Clicks" Daily Activity     Outcome Measure   Help from another person eating meals?: None Help from another person taking care of personal grooming?: A Little Help from another person toileting, which includes using toliet, bedpan, or urinal?: Total Help from another person bathing (including washing, rinsing, drying)?: A Lot Help from another person to put on and taking off regular upper body clothing?: A Lot Help from another person to put on and taking off regular lower body clothing?: A Lot 6 Click Score: 14    End of Session Equipment Utilized During Treatment: Rolling walker (2 wheels);Gait belt  OT Visit Diagnosis: Unsteadiness on feet (R26.81);Other abnormalities of gait and mobility (R26.89);Repeated falls (R29.6);History of falling (Z91.81);Muscle weakness (generalized) (M62.81)   Activity Tolerance Patient  tolerated treatment well   Patient Left in bed;with call bell/phone within reach;with bed alarm set;with family/visitor present   Nurse Communication Mobility status        Time: 0927-0950 OT Time Calculation (min): 23 min  Charges: OT General Charges $OT Visit: 1 Visit OT Treatments $Self Care/Home Management : 8-22 mins  Linward Foster, MS, OTR/L   Alvester Morin 07/29/2023, 10:07 AM

## 2023-07-29 NOTE — TOC Initial Note (Deleted)
 Transition of Care [facility]) - Initial/Assessment Note    Patient Details  Name: Nina Perez MRN: [id number] Date of Birth: 10-18-1940  Transition of Care [facility]) CM/SW Contact:    Cherre Blanc, RN Phone Number: 07/29/2023, 11:23 AM  Clinical Narrative:                 Patient is medically clear for DC to [facility] for inpatient rehab. TOC spoke with patient and her husband in the room and they agree with the DC plan. Transportation arranged via [employer]  Nurse to call report to [phone]  Expected Discharge Plan: Skilled Nursing Facility Barriers to Discharge: SNF Pending bed offer   Patient Goals and CMS Choice            Expected Discharge Plan and Services   Discharge Planning Services: CM Consult   Living arrangements for the past 2 months: Skilled Nursing Facility Expected Discharge Date: 07/29/23                                    Prior Living Arrangements/Services Living arrangements for the past 2 months: Skilled Nursing Facility Lives with:: Spouse Patient language and need for interpreter reviewed:: Yes        Need for Family Participation in Patient Care: Yes (Comment) Care giver support system in place?: Yes (comment)   Criminal Activity/Legal Involvement Pertinent to Current Situation/Hospitalization: No - Comment as needed  Activities of Daily Living   ADL Screening (condition at time of admission) Independently performs ADLs?: No Does the patient have a NEW difficulty with bathing/dressing/toileting/self-feeding that is expected to last >3 days?: Yes (Initiates electronic notice to provider for possible OT consult) Does the patient have a NEW difficulty with getting in/out of bed, walking, or climbing stairs that is expected to last >3 days?: Yes (Initiates electronic notice to provider for possible PT consult) Does the patient have a NEW difficulty with communication that is expected to last >3 days?: No Is the patient deaf or have  difficulty hearing?: No Does the patient have difficulty seeing, even when wearing glasses/contacts?: No Does the patient have difficulty concentrating, remembering, or making decisions?: No  Permission Sought/Granted   Permission granted to share information with : Yes, Verbal Permission Granted  Share Information with NAME: Pettinger,Ronald (Spouse)  [phone] (Mobile)           Emotional Assessment Appearance:: Appears stated age Attitude/Demeanor/Rapport: Engaged Affect (typically observed): Appropriate Orientation: : Oriented to Self, Oriented to Place, Oriented to  Time, Oriented to Situation Alcohol / Substance Use: Not Applicable Psych Involvement: No (comment)  Admission diagnosis:  Right arm weakness [R29.898] Left arm weakness [R29.898] Closed right hip fracture (HCC) [S72.001A] Fall, initial encounter [W19.XXXA] Patient Active Problem List   Diagnosis Date Noted   Cervical spondylosis without myelopathy 07/26/2023   Fall 07/26/2023   Right arm weakness 07/18/2023   Type II diabetes mellitus with renal manifestations (HCC) 07/18/2023   Myocardial injury 07/18/2023   PVD (peripheral vascular disease) (HCC) 07/18/2023   Chronic diastolic CHF (congestive heart failure) (HCC) 07/18/2023   Chronic kidney disease, stage 3a (HCC) 07/18/2023   IPF (idiopathic pulmonary fibrosis) (HCC) 07/18/2023   Fall at home, initial encounter 07/18/2023   Left arm weakness 07/18/2023   Stroke (HCC) 07/18/2023   Open wound of knee, left, subsequent encounter 06/07/2023   Hyponatremia 04/08/2023   History of non-ST elevation myocardial infarction (NSTEMI) 04/07/2023  Acute respiratory failure with hypoxia (HCC) 04/07/2023   Cellulitis of knee, left 04/07/2023   Acute kidney injury superimposed on CKD (HCC) 04/07/2023   Cellulitis of left knee 04/07/2023   UTI (urinary tract infection) 04/06/2023   Generalized weakness 04/06/2023   SOB (shortness of breath) on exertion 03/02/2023    Lower extremity cellulitis 03/02/2023   Left hip pain 03/02/2023   Positive self-administered antigen test for COVID-19 01/14/2023   CAD (coronary artery disease) 12/13/2022   Dyslipidemia 11/22/2022   Essential hypertension 11/22/2022   NSTEMI (non-ST elevated myocardial infarction) (HCC) 11/21/2022   Cardiomyopathy (HCC) 11/17/2022   Balance problem 11/17/2022   Anemia in chronic kidney disease 06/24/2022   Congestive heart failure (HCC) 06/24/2022   Idiopathic pulmonary fibrosis (HCC) 06/24/2022   Proteinuria, unspecified 06/24/2022   Ear lesion 04/20/2022   Change in bowel movement 04/20/2022   History of rectal bleeding 11/13/2021   Total knee replacement status 11/13/2021   Knee pain 08/21/2021   Occult blood positive stool 07/04/2021   Rectal bleeding 06/26/2021   Redness of skin 04/09/2021   Aortic atherosclerosis (HCC) 01/20/2021   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   Nocturia 12/18/2020   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) 10/08/2020   Abnormal ECG 08/06/2020   Premature beats 07/03/2020   Dysuria 12/11/2019   Adenomatous colon polyp 11/21/2019   Phlebitis 11/21/2019   Renal cyst 11/21/2019   Swelling of lower extremity 10/26/2019   Hypomagnesemia 06/03/2019   CKD (chronic kidney disease) stage 3, GFR 30-59 ml/min (HCC) 06/02/2019   Atherosclerosis of native arteries of extremity with rest pain (HCC) 05/29/2019   Critical lower limb ischemia (HCC) 05/16/2019   Open wound of left foot 03/27/2019   Decreased GFR 11/13/2018   Bladder incontinence 11/13/2018   Elevated TSH 11/13/2018   Thrombocytopenia (HCC) 07/03/2018   Acute right-sided thoracic back pain 06/16/2018   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   Pulmonary hypertension, mild (HCC) 03/04/2018   Murmur 08/16/2017   Lymphedema 03/04/2016   Lesion of neck 12/15/2015   Dizziness 12/15/2015   Venous stasis dermatitis of right lower extremity 09/10/2015   Varicose veins of right lower extremity  with inflammation 09/10/2015   Breast nodule 11/05/2014   Health care maintenance 11/02/2014   Obesity (BMI 30-39.9) 06/04/2014   Osteoarthritis 02/27/2014   Environmental allergies 01/15/2014   Type 2 diabetes mellitus with hyperlipidemia (HCC) 05/22/2012   Hypertension 05/22/2012   Hypercholesterolemia 05/22/2012   Rheumatoid arthritis (HCC) 05/22/2012   Barrett's esophagus 05/22/2012   Gout 05/22/2012   GERD (gastroesophageal reflux disease) 05/22/2012   History of colon polyps 05/22/2012   Type 2 diabetes mellitus without complications (HCC) 05/22/2012   PCP:  Dale Northport, MD Pharmacy:   [facility] [address] [place] - [address] [place] [address] Phone: [phone] Fax: [phone]  [facility] - [place], [place] - [address] [place] [address] Phone: [phone] Fax: [phone]  [facility] [address] [place] [address] Phone: [phone] Fax: [phone]     Social Drivers of Health (SDOH) Social History: SDOH Screenings   Food Insecurity: No Food Insecurity (07/20/2023)  Housing: Low Risk  (07/20/2023)  Transportation Needs: No Transportation Needs (07/20/2023)  Utilities: Not At Risk (07/20/2023)  Alcohol Screen: Low Risk  (01/15/2023)  Depression (PHQ2-9): Low Risk  (02/04/2023)  Financial Resource Strain: Low Risk  (01/15/2023)  Physical Activity: Unknown (01/15/2023)  Social Connections: Moderately Integrated (07/20/2023)  Stress: No Stress Concern Present (01/15/2023)  Tobacco Use: Medium Risk (07/20/2023)  Health Literacy: Adequate Health Literacy (01/15/2023)   SDOH Interventions:     Readmission Risk Interventions    04/08/2023    2:23 PM  Readmission Risk Prevention Plan  Transportation Screening Complete  PCP or Specialist Appt within 3-5 Days Complete  HRI or Home Care Consult Complete  Medication Review (RN  Care Manager) Complete

## 2023-07-29 NOTE — TOC Transition Note (Signed)
 Transition of Care [facility]) - Discharge Note   Patient Details  Name: Nina Perez MRN: [id number] Date of Birth: 10-01-1940  [facility]) CM/SW Contact:  Cherre Blanc, RN Phone Number: 07/29/2023, 12:10 PM   Clinical Narrative:    Patient is medically clear for DC to [facility] for inpatient rehab. TOC spoke with patient and her husband in the room and they agree with the DC plan. Transportation arranged via [employer]   Nurse to call report to [phone]     Final next level of care: Skilled Nursing Facility Barriers to Discharge: SNF Pending bed offer   Patient Goals and CMS Choice            Discharge Placement                       Discharge Plan and Services Additional resources added to the After Visit Summary for     Discharge Planning Services: CM Consult                                 Social Drivers of Health (SDOH) Interventions SDOH Screenings   Food Insecurity: No Food Insecurity (07/20/2023)  Housing: Low Risk  (07/20/2023)  Transportation Needs: No Transportation Needs (07/20/2023)  Utilities: Not At Risk (07/20/2023)  Alcohol Screen: Low Risk  (01/15/2023)  Depression (PHQ2-9): Low Risk  (02/04/2023)  Financial Resource Strain: Low Risk  (01/15/2023)  Physical Activity: Unknown (01/15/2023)  Social Connections: Moderately Integrated (07/20/2023)  Stress: No Stress Concern Present (01/15/2023)  Tobacco Use: Medium Risk (07/20/2023)  Health Literacy: Adequate Health Literacy (01/15/2023)     Readmission Risk Interventions    04/08/2023    2:23 PM  Readmission Risk Prevention Plan  Transportation Screening Complete  PCP or Specialist Appt within 3-5 Days Complete  HRI or Home Care Consult Complete  Medication Review (RN Care Manager) Complete

## 2023-07-29 NOTE — Discharge Summary (Signed)
 [facility] Discharge Summary   Patient: Nina Perez:[id number]  PCP: Dale [place], MD  Date of admission: 07/18/2023   Date of discharge:  07/29/2023     Discharge Diagnoses:  Principal Problem:   Stroke [facility]) Active Problems:   UTI (urinary tract infection)   Left arm weakness   Fall at home, initial encounter   CAD (coronary artery disease)   Myocardial injury   Hypercholesterolemia   Type II diabetes mellitus with renal manifestations (HCC)   Hypertension   Chronic diastolic CHF (congestive heart failure) (HCC)   PVD (peripheral vascular disease) (HCC)   Chronic kidney disease, stage 3a (HCC)   IPF (idiopathic pulmonary fibrosis) (HCC)   Rheumatoid arthritis (HCC)   Obesity (BMI 30-39.9)   Gout   Cervical spondylosis without myelopathy   Fall   Admitted From: Home Disposition:  SNF   Recommendations for Outpatient Follow-up:  Follow-up with PCP, patient should be seen by an MD in 1 to 2 days, continue to monitor BP and titrate medication accordingly.  Repeat BMP in 1 week to check sodium level. Follow-up with neurology in 1 to 2 weeks, may need EMG and NCS for left arm weakness to rule out nerve compression due to C-spine stenosis. Follow-up with cardiology, continue cardiac monitor for 30 days.  Need to rule out occult A-fib Follow-up with neurosurgery for cervical spinal stenosis if no improvement in the left arm weakness Follow up LABS/TEST:  BMP in 1 wk and Above   Follow-up Information     Dale [place], MD Follow up.   Specialty: Internal Medicine Why: Hospital follow up Contact information: [address] [place] [address] [phone]                Diet recommendation: Cardiac and Carb modified diet  Activity: The patient is advised to gradually reintroduce usual activities, as tolerated  Discharge Condition: stable  Code Status: Full code   History of present illness: As per the H and P dictated on  admission Hospital Course:  83 y.o. female with medical history significant of HTN, HLD, DM, PVD, CAD with stent, dCHF, gout, RA, scleroderma, mild pulmonary fibrosis, pulmonary hypertension, left bundle blockage, CKD 3A, who presents with fall and weakness, urinary frequency.  Subsequently found to have BL CVAs.   Assessment and Plan:   # Acute bilateral CVA Likely contributing etiology to patient's weakness.   MRI noting punctate acute infarcts in bilateral frontal/parietal lobes and cerebellum.  Obvious concern for embolic etiology.  CT angio unremarkable.  Echo LVEF 55 to 60%, moderate LV hypertrophy, grade 1 diastolic dysfunction LDL 32, below goal 70, A1c 6.1, below goal 7%, TSH 4.1 WNL Neuro consulted, recommended to continue DAPT due to recent cardiac stent, continue statin.  30 day Cardiac monitor on discharge, Informed to Card[facility] [place] for cardiac monitor. PT/OT evaluation for possible SNF placement.  MRI C-spine due to persistent weakness as bellow  3/17 physical debility due to laying in bed, seen again by Neuro Rec T-spine MRI but refused.     # Cervical spine stenosis MRI C-spine: Mild spinal canal stenosis at C4-5 and C5-6. Severe left C4-5 and C5-6 neural foraminal stenosis.  Neurology recommended that neuroforaminal stenosis could be contributing to some of her left upper extremity weakness, given pain and effort limitations on exam would need EMG/nerve conduction study to confirm which could be completed outpatient if desired by patient/family.  3/16 Neurosurgery consulted, recommended no surgical intervention, left arm weakness  is due to stroke, less likely due to cervical stenosis.  No surgical intervention.  May follow-up as an outpatient.     # Pyuria, UTI ruled out Patient denied any dysuria, no UTI symptoms urine culture growing multiple species, suggestive reticulation  WBC within normal range. S/p ceftriaxone and Levaquin DC'd on 3/10 Monitor off antibiotics  for now   # CAD s/p cardiac stent, slightly elevated troponin. resumed DAPT with statin medications. No chest pain, shortness of breath to suggest ACS. # Diabetes mellitus: s/p Insulin sliding scale.  Patient is not on any medication at home.  Continue diabetic diet and monitor CBG. # Hypertension: BP remained soft, so decreased dose of metoprolol XL from 100 to 25 mg p.o. daily, continue amlodipine 5 mg daily.  Discontinued diuretics, no need at this time.  Watch for volume status.  Monitor BP and titrate medications accordingly. # Chronic HFpEF: Well compensated, no need of diuretics at this time, monitor volume status.  Held diuretics due to hyponatremia. Continue Toprol xl 25 mg po daily  # Peripheral vascular disease: Aspirin, Plavix, Lipitor on board. # Rheumatoid arthritis/scleroderma/idiopathic pulmonary fibrosis Resume home medication regiment.  Nebulizers as needed. # Hyponatremia possibly due to nutritional deficiency Serum osmolarity 281 within normal range, Na 128--130--132--131 Started Salt tabs.  Repeat BMP after 1 week # Hypomagnesemia, mag repleted.  Resolved # Obesity, class I,  BMI 33.2 Encouraged weight loss, cardiac/diabetic diet. # h/o Gout, Does not appear to be acutely exacerbated.  Allopurinol on board. Uric acid level 5.1 wnl # Constipation, started laxatives.  Suppository given on 3/12, 3/14 and 3/16 Use enema if no BM. Pt had a BM on 3/19 today am.    Body mass index is 33.2 kg/m.  Nutrition Interventions:  Pressure Injury 04/11/23 Heel Left Stage 1 -  Intact skin with non-blanchable redness of a localized area usually over a bony prominence. (Active)  04/11/23 0409  Location: Heel  Location Orientation: Left  Staging: Stage 1 -  Intact skin with non-blanchable redness of a localized area usually over a bony prominence.  Wound Description (Comments):   Present on Admission: No     Pain control  - [place] Controlled Substance Reporting System  database could not be reviewed as website was not working.   - 10 tabs tramadol prescription was printed because of SNF requirement - Patient was instructed, not to drive, operate heavy machinery, perform activities at heights, swimming or participation in water activities or provide baby sitting services while on Pain, Sleep and Anxiety Medications; until her outpatient Physician has advised to do so again.  - Also recommended to not to take more than prescribed Pain, Sleep and Anxiety Medications.  Patient was seen by physical therapy, who recommended Therapy, SNF placement, which was arranged. On the day of the discharge the patient's vitals were stable, and no other acute medical condition were reported by patient. the patient was felt safe to be discharge at [facility] for physical therapy..  Consultants: Neurology, neurosurgery Procedures: None  Discharge Exam: General: Appear in no distress, no Rash; Oral Mucosa Clear, moist. Cardiovascular: S1 and S2 Present, no Murmur, Respiratory: normal respiratory effort, Bilateral Air entry present and no Crackles, no wheezes Abdomen: Bowel Sound present, Soft and no tenderness, no hernia Extremities: no Pedal edema, no calf tenderness Neurology: AAO x 4, left arm weakness power 4/5, affect appropriate.  Filed Weights   07/28/23 0824 07/28/23 2325 07/29/23 0325  Weight: 92.9 kg 93.3 kg 93.3 kg   Vitals:  07/29/23 0325 07/29/23 0727  BP: (!) 124/56 (!) 131/52  Pulse: 65 66  Resp: 18 16  Temp: 98.8 F (37.1 C) 98.1 F (36.7 C)  SpO2: 93% 91%    DISCHARGE MEDICATION: Allergies as of 07/29/2023       Reactions   Ethanol Other (See Comments)   REDNESS AND BLISTERS   Tape Other (See Comments)   Ob band aid adhesive  Pull skin off / Rash Ob band aid adhesive  Pull skin off / Rash   Triamterene-hctz Other (See Comments)   Weakness/fatigue   Cephalexin Rash   Blotchiness to B lower legs after several days of cephalexin for UTI    Hydrochlorothiazide W-triamterene Other (See Comments)   Other reaction(s): Other (See Comments) Extreme fatigue/weakness Extreme fatigue/weakness    Other reaction(s): Other (See Comments) Extreme fatigue/weakness        Medication List     STOP taking these medications    metFORMIN 500 MG tablet Commonly known as: GLUCOPHAGE   spironolactone 25 MG tablet Commonly known as: ALDACTONE   torsemide 20 MG tablet Commonly known as: DEMADEX       TAKE these medications    acetaminophen 650 MG CR tablet Commonly known as: TYLENOL Take 650-1,300 mg by mouth every 8 (eight) hours as needed for pain.   allopurinol 100 MG tablet Commonly known as: ZYLOPRIM Take 1 tablet (100 mg total) by mouth daily.   amLODipine 5 MG tablet Commonly known as: NORVASC Take 1 tablet (5 mg total) by mouth daily.   aspirin EC 81 MG tablet Take 1 tablet (81 mg total) by mouth daily. Swallow whole.   atorvastatin 80 MG tablet Commonly known as: LIPITOR Take 0.5 tablets (40 mg total) by mouth daily.   bisacodyl 5 MG EC tablet Commonly known as: DULCOLAX Take 2 tablets (10 mg total) by mouth at bedtime.   bisacodyl 10 MG suppository Commonly known as: DULCOLAX Place 1 suppository (10 mg total) rectally daily as needed for severe constipation.   clopidogrel 75 MG tablet Commonly known as: PLAVIX Take 1 tablet (75 mg total) by mouth daily with breakfast.   fexofenadine 180 MG tablet Commonly known as: ALLEGRA Take 1 tablet (180 mg total) by mouth daily. What changed:  when to take this reasons to take this   folic acid 1 MG tablet Commonly known as: FOLVITE Take 1 mg by mouth every evening.   glucose blood test strip Commonly known as: IT consultant USE  STRIP TO CHECK GLUCOSE TWICE DAILY. Dx: E11.9   ipratropium 0.06 % nasal spray Commonly known as: ATROVENT Place 2 sprays into both nostrils 4 (four) times daily. What changed:  when to take this reasons to take  this   magnesium oxide 400 MG tablet Commonly known as: MAG-OX Take 1 tablet (400 mg total) by mouth 2 (two) times daily.   methocarbamol 500 MG tablet Commonly known as: ROBAXIN Take 1 tablet (500 mg total) by mouth every 8 (eight) hours as needed for muscle spasms.   methotrexate 2.5 MG tablet Commonly known as: RHEUMATREX Take 10 mg by mouth every Wednesday.   metoprolol succinate 25 MG 24 hr tablet Commonly known as: TOPROL-XL Take 1 tablet (25 mg total) by mouth daily. Take with or immediately following a meal. What changed:  medication strength how much to take   Microlet Lancets Misc Check sugar once daily, [employer] Microlet Lancets. Dx E11.9   nitroGLYCERIN 0.4 MG SL tablet Commonly known as: NITROSTAT Place 1  tablet (0.4 mg total) under the tongue every 5 (five) minutes x 3 doses as needed for chest pain.   pantoprazole 40 MG tablet Commonly known as: PROTONIX Take 1 tablet (40 mg total) by mouth daily.   polyethylene glycol 17 g packet Commonly known as: MIRALAX / GLYCOLAX Take 17 g by mouth 2 (two) times daily.   sodium chloride 1 g tablet Take 1 tablet (1 g total) by mouth 3 (three) times daily with meals for 10 days.   traMADol 50 MG tablet Commonly known as: ULTRAM Take 1 tablet (50 mg total) by mouth every 8 (eight) hours as needed.       Allergies  Allergen Reactions   Ethanol Other (See Comments)    REDNESS AND BLISTERS   Tape Other (See Comments)    Ob band aid adhesive   Pull skin off / Rash  Ob band aid adhesive  Pull skin off / Rash   Triamterene-Hctz Other (See Comments)    Weakness/fatigue   Cephalexin Rash    Blotchiness to B lower legs after several days of cephalexin for UTI   Hydrochlorothiazide W-Triamterene Other (See Comments)    Other reaction(s): Other (See Comments)  Extreme fatigue/weakness  Extreme fatigue/weakness    Other reaction(s): Other (See Comments) Extreme fatigue/weakness   Discharge Instructions      Call MD for:   Complete by: As directed    Worsening of weakness or numbness, new neurological changes.   Call MD for:  difficulty breathing, headache or visual disturbances   Complete by: As directed    Call MD for:  extreme fatigue   Complete by: As directed    Call MD for:  persistant dizziness or light-headedness   Complete by: As directed    Call MD for:  severe uncontrolled pain   Complete by: As directed    Diet - low sodium heart healthy   Complete by: As directed    Discharge instructions   Complete by: As directed    Follow-up with PCP, patient should be seen by an MD in 1 to 2 days, continue to monitor BP and titrate medication accordingly.  Repeat BMP in 1 week to check sodium level. Follow-up with neurology in 1 to 2 weeks, may need EMG and NCS for left arm weakness to rule out nerve compression due to C-spine stenosis. Follow-up with cardiology, continue cardiac monitor for 30 days.  Need to rule out occult A-fib Follow-up with neurosurgery for cervical spinal stenosis if no improvement in the left arm weakness   Increase activity slowly   Complete by: As directed        The results of significant diagnostics from this hospitalization (including imaging, microbiology, ancillary and laboratory) are listed below for reference.    Significant Diagnostic Studies: MR CERVICAL SPINE WO CONTRAST Result Date: 07/20/2023 CLINICAL DATA:  Cervical myelopathy with history of rheumatoid arthritis. EXAM: MRI CERVICAL SPINE WITHOUT CONTRAST TECHNIQUE: Multiplanar, multisequence MR imaging of the cervical spine was performed. No intravenous contrast was administered. COMPARISON:  None Available. FINDINGS: Alignment: Grade 1 retrolisthesis at C5-6 Vertebrae: No fracture, evidence of discitis, or bone lesion. Cord: Normal signal and morphology. Mild prominence of the central canal at C3 and C4. Posterior Fossa, vertebral arteries, paraspinal tissues: Negative. Disc levels: C1-2:  Unremarkable. C2-3: Normal disc space and facet joints. There is no spinal canal stenosis. No neural foraminal stenosis. C3-4: Small disc bulge with uncovertebral spurring. There is no spinal canal stenosis. No neural foraminal stenosis. C4-5:  Small disc bulge with left-greater-than-right uncovertebral hypertrophy. Mild spinal canal stenosis. Severe left neural foraminal stenosis. C5-6: Small left asymmetric disc bulge with uncovertebral hypertrophy. Mild spinal canal stenosis. Severe left neural foraminal stenosis. C6-7: Minimal disc bulge. There is no spinal canal stenosis. No neural foraminal stenosis. C7-T1: Normal disc space and facet joints. There is no spinal canal stenosis. No neural foraminal stenosis. IMPRESSION: 1. Mild spinal canal stenosis at C4-5 and C5-6. 2. Severe left C4-5 and C5-6 neural foraminal stenosis. Electronically Signed   By: Deatra Robinson M.D.   On: 07/20/2023 21:47   ECHOCARDIOGRAM COMPLETE BUBBLE STUDY Result Date: 07/20/2023    ECHOCARDIOGRAM REPORT   Patient Name:   Nina Perez Date of Exam: 07/20/2023 Medical Rec #:  [id number]    Height:       63.0 in Accession #:    [id number]   Weight:       200.0 lb Date of Birth:  1940-07-12    BSA:          1.933 m Patient Age:    83 years     BP:           138/74 mmHg Patient Gender: F            HR:           75 bpm. Exam Location:  [facility] Procedure: 2D Echo, Cardiac Doppler, Color Doppler and Saline Contrast Bubble            Study (Both Spectral and Color Flow Doppler were utilized during            procedure). Indications:     Stroke  History:         Patient has prior history of Echocardiogram examinations, most                  recent 11/23/2022. Cardiomyopathy and CHF, CAD and Previous                  Myocardial Infarction, Abnormal ECG, Stroke,                  Signs/Symptoms:Dizziness/Lightheadedness, Murmur and Shortness                  of Breath; Risk Factors:Hypertension, Diabetes and                  Dyslipidemia. CKD.   Sonographer:     Mikki Harbor Referring Phys:  [id number] Brien Few NIU Diagnosing Phys: Lorine Bears MD  Sonographer Comments: Technically difficult study due to poor echo windows and patient is obese. IMPRESSIONS  1. Left ventricular ejection fraction, by estimation, is 55 to 60%. The left ventricle has normal function. The left ventricle has no regional wall motion abnormalities. There is moderate left ventricular hypertrophy. Left ventricular diastolic parameters are consistent with Grade I diastolic dysfunction (impaired relaxation).  2. Right ventricular systolic function is normal. The right ventricular size is normal.  3. Left atrial size was mildly dilated.  4. The mitral valve is normal in structure. No evidence of mitral valve regurgitation. No evidence of mitral stenosis. Moderate mitral annular calcification.  5. The aortic valve is normal in structure. Aortic valve regurgitation is not visualized. Mild aortic valve stenosis. Aortic valve area, by VTI measures 2.05 cm. Aortic valve mean gradient measures 10.0 mmHg.  6. The inferior vena cava is normal in size with greater than 50% respiratory variability, suggesting right atrial pressure of 3 mmHg.  7. Agitated  saline contrast bubble study was negative, with no evidence of any interatrial shunt. FINDINGS  Left Ventricle: Left ventricular ejection fraction, by estimation, is 55 to 60%. The left ventricle has normal function. The left ventricle has no regional wall motion abnormalities. The left ventricular internal cavity size was normal in size. There is  moderate left ventricular hypertrophy. Left ventricular diastolic parameters are consistent with Grade I diastolic dysfunction (impaired relaxation). Right Ventricle: The right ventricular size is normal. No increase in right ventricular wall thickness. Right ventricular systolic function is normal. Left Atrium: Left atrial size was mildly dilated. Right Atrium: Right atrial size was normal in size.  Pericardium: There is no evidence of pericardial effusion. Mitral Valve: The mitral valve is normal in structure. Moderate mitral annular calcification. No evidence of mitral valve regurgitation. No evidence of mitral valve stenosis. MV peak gradient, 7.2 mmHg. The mean mitral valve gradient is 3.0 mmHg. Tricuspid Valve: The tricuspid valve is normal in structure. Tricuspid valve regurgitation is not demonstrated. No evidence of tricuspid stenosis. Aortic Valve: The aortic valve is normal in structure. Aortic valve regurgitation is not visualized. Mild aortic stenosis is present. Aortic valve mean gradient measures 10.0 mmHg. Aortic valve peak gradient measures 17.0 mmHg. Aortic valve area, by VTI measures 2.05 cm. Pulmonic Valve: The pulmonic valve was normal in structure. Pulmonic valve regurgitation is not visualized. No evidence of pulmonic stenosis. Aorta: The aortic root is normal in size and structure. Venous: The inferior vena cava is normal in size with greater than 50% respiratory variability, suggesting right atrial pressure of 3 mmHg. IAS/Shunts: No atrial level shunt detected by color flow Doppler. Agitated saline contrast was given intravenously to evaluate for intracardiac shunting. Agitated saline contrast bubble study was negative, with no evidence of any interatrial shunt.  LEFT VENTRICLE PLAX 2D LVIDd:         3.80 cm   Diastology LVIDs:         2.70 cm   LV e' medial:    5.00 cm/s LV PW:         1.10 cm   LV E/e' medial:  12.5 LV IVS:        1.60 cm   LV e' lateral:   4.35 cm/s LVOT diam:     2.00 cm   LV E/e' lateral: 14.3 LV SV:         85 LV SV Index:   44 LVOT Area:     3.14 cm  RIGHT VENTRICLE RV Basal diam:  3.05 cm RV Mid diam:    2.90 cm RV S prime:     22.20 cm/s TAPSE (M-mode): 2.4 cm LEFT ATRIUM             Index        RIGHT ATRIUM           Index LA diam:        4.00 cm 2.07 cm/m   RA Area:     16.70 cm LA Vol (A2C):   59.1 ml 30.57 ml/m  RA Volume:   35.90 ml  18.57 ml/m LA  Vol (A4C):   79.8 ml 41.27 ml/m LA Biplane Vol: 67.1 ml 34.70 ml/m  AORTIC VALVE                     PULMONIC VALVE AV Area (Vmax):    1.77 cm      PV Vmax:       1.27 m/s AV Area (Vmean):  1.76 cm      PV Peak grad:  6.5 mmHg AV Area (VTI):     2.05 cm AV Vmax:           206.00 cm/s AV Vmean:          149.000 cm/s AV VTI:            0.416 m AV Peak Grad:      17.0 mmHg AV Mean Grad:      10.0 mmHg LVOT Vmax:         116.00 cm/s LVOT Vmean:        83.500 cm/s LVOT VTI:          0.272 m LVOT/AV VTI ratio: 0.65  AORTA Ao Root diam: 3.50 cm Ao Asc diam:  3.10 cm MITRAL VALVE MV Area (PHT): 6.02 cm     SHUNTS MV Area VTI:   3.15 cm     Systemic VTI:  0.27 m MV Peak grad:  7.2 mmHg     Systemic Diam: 2.00 cm MV Mean grad:  3.0 mmHg MV Vmax:       1.34 m/s MV Vmean:      75.7 cm/s MV Decel Time: 126 msec MV E velocity: 62.40 cm/s MV A velocity: 104.00 cm/s MV E/A ratio:  0.60 Lorine Bears MD Electronically signed by Lorine Bears MD Signature Date/Time: 07/20/2023/2:58:53 PM    Final    CT ANGIO HEAD NECK W WO CM Result Date: 07/19/2023 CLINICAL DATA:  Neuro deficit, acute, stroke suspected. EXAM: CT ANGIOGRAPHY HEAD AND NECK WITH AND WITHOUT CONTRAST TECHNIQUE: Multidetector CT imaging of the head and neck was performed using the standard protocol during bolus administration of intravenous contrast. Multiplanar CT image reconstructions and MIPs were obtained to evaluate the vascular anatomy. Carotid stenosis measurements (when applicable) are obtained utilizing NASCET criteria, using the distal internal carotid diameter as the denominator. RADIATION DOSE REDUCTION: This exam was performed according to the departmental dose-optimization program which includes automated exposure control, adjustment of the mA and/or kV according to patient size and/or use of iterative reconstruction technique. CONTRAST:  75mL OMNIPAQUE IOHEXOL 350 MG/ML SOLN COMPARISON:  MRI from March 8, 25. FINDINGS: CTA NECK FINDINGS Aortic  arch: Aortic atherosclerosis. Great vessel origins are patent. Right carotid system: No evidence of dissection, stenosis (50% or greater), or occlusion. Left carotid system: No evidence of dissection, stenosis (50% or greater), or occlusion. Vertebral arteries: Left dominant. No evidence of dissection, stenosis (50% or greater), or occlusion. Skeleton: No acute abnormality on limited assessment. Multilevel degenerative change in the cervical spine. Other neck: No acute abnormality on limited assessment. Upper chest: Expiratory imaging. No consolidation. Enlarged main pulmonary arteries. Review of the MIP images confirms the above findings CTA HEAD FINDINGS Anterior circulation: Bilateral intracranial ICAs, MCAs, and ACAs are patent without proximal hemodynamically significant stenosis. Posterior circulation: Both vertebral arteries, basilar artery and bilateral posterior cerebral arteries are patent without proximal hemodynamically significant stenosis. No aneurysm identified. Venous sinuses: As permitted by contrast timing, patent. Review of the MIP images confirms the above findings IMPRESSION: 1. No large vessel occlusion or proximal hemodynamically significant stenosis. 2. Enlarged main pulmonary arteries, suggestive of pulmonary arterial hypertension. 3.  Aortic Atherosclerosis (ICD10-I70.0). Electronically Signed   By: Feliberto Harts M.D.   On: 07/19/2023 03:17   MR BRAIN WO CONTRAST Result Date: 07/18/2023 CLINICAL DATA:  Neuro deficit, acute, stroke suspected EXAM: MRI HEAD WITHOUT CONTRAST TECHNIQUE: Multiplanar, multiecho pulse sequences of the brain and surrounding structures were  obtained without intravenous contrast. COMPARISON:  CT head from today. FINDINGS: Brain: Many punctate acute infarcts in the right greater than left frontal and parietal lobes and the cerebellum. No significant mass effect or midline shift. No evidence of acute hemorrhage, mass lesion or midline shift. Remote bilateral  cerebellar infarcts. Moderate patchy T2/FLAIR hyperintense white matter, compatible chronic microvascular disease. Cerebral atrophy. Vascular: Major arterial flow voids are maintained at the skull base. Skull and upper cervical spine: Normal marrow signal. Sinuses/Orbits: Mild paranasal sinus mucosal thickening. No acute orbital findings. Other: No mastoid effusions. IMPRESSION: Many punctate acute infarcts in the right greater than left frontal and parietal lobes and the cerebellum. Given involvement of multiple vascular territories, consider an embolic etiology. Electronically Signed   By: Feliberto Harts M.D.   On: 07/18/2023 23:18   CT Hip Right Wo Contrast Result Date: 07/18/2023 CLINICAL DATA:  Fracture suspected. EXAM: CT OF THE RIGHT HIP WITHOUT CONTRAST TECHNIQUE: Multidetector CT imaging of the right hip was performed according to the standard protocol. Multiplanar CT image reconstructions were also generated. RADIATION DOSE REDUCTION: This exam was performed according to the departmental dose-optimization program which includes automated exposure control, adjustment of the mA and/or kV according to patient size and/or use of iterative reconstruction technique. COMPARISON:  Right hip x-ray same day FINDINGS: Bones/Joint/Cartilage The bones are diffusely osteopenic. No acute fracture or dislocation identified. No focal osseous lesion. No significant joint effusion. Ligaments Suboptimally assessed by CT. Muscles and Tendons No intramuscular hematoma or fluid collection. Soft tissues There is mild soft tissue thickening lateral to the right hip. No fluid collection, foreign body or gas identified. IMPRESSION: 1. No acute fracture or dislocation identified. 2. Mild soft tissue thickening lateral to the right hip. Electronically Signed   By: Darliss Cheney M.D.   On: 07/18/2023 18:02   CT Lumbar Spine Wo Contrast Result Date: 07/18/2023 CLINICAL DATA:  Pain after fall. EXAM: CT LUMBAR SPINE WITHOUT  CONTRAST TECHNIQUE: Multidetector CT imaging of the lumbar spine was performed without intravenous contrast administration. Multiplanar CT image reconstructions were also generated. RADIATION DOSE REDUCTION: This exam was performed according to the departmental dose-optimization program which includes automated exposure control, adjustment of the mA and/or kV according to patient size and/or use of iterative reconstruction technique. COMPARISON:  None Available. FINDINGS: Segmentation: 5 lumbar type vertebrae. Alignment: Broad-based dextroscoliotic curvature. Trace retrolisthesis of L2 on L3 and L5 on S1. Vertebrae: No acute fracture. Vertebral body heights are normal. The posterior elements are intact. Included sacrum is intact. Paraspinal and other soft tissues: Aortic atherosclerosis. Colonic diverticulosis no paravertebral hematoma. Disc levels: L1-L2: No spinal canal or neural foraminal stenosis. L2-L3: Ligamentum flavum hypertrophy and broad-based disc bulge causes mild left neural foraminal stenosis. L3-L4: Facet and ligamentum flavum hypertrophy with broad-based disc bulge causes left neural foraminal stenosis. L4-L5: Facet ligamentum flavum hypertrophy is well as broad-based disc bulge causes spinal canal and severe right neural foraminal stenosis. Moderate left neural foraminal narrowing. L5-S1: Facet and ligamentum flavum hypertrophy and broad-based disc bulge causes mild spinal canal and right neural foraminal stenosis. IMPRESSION: 1. No fracture of the lumbar spine. 2. Multilevel degenerative change with spinal canal and neural foraminal stenosis as described. Mild scoliosis. Electronically Signed   By: Narda Rutherford M.D.   On: 07/18/2023 16:38   CT Head Wo Contrast Result Date: 07/18/2023 CLINICAL DATA:  Head trauma, minor (Age >= 65y) fall; fall EXAM: CT HEAD WITHOUT CONTRAST CT CERVICAL SPINE WITHOUT CONTRAST TECHNIQUE: Multidetector CT imaging of the  head and cervical spine was performed  following the standard protocol without intravenous contrast. Multiplanar CT image reconstructions of the cervical spine were also generated. RADIATION DOSE REDUCTION: This exam was performed according to the departmental dose-optimization program which includes automated exposure control, adjustment of the mA and/or kV according to patient size and/or use of iterative reconstruction technique. COMPARISON:  None Available. FINDINGS: CT HEAD FINDINGS Brain: Cerebral ventricle sizes are concordant with the degree of cerebral volume loss. Patchy and confluent areas of decreased attenuation are noted throughout the deep and periventricular white matter of the cerebral hemispheres bilaterally, compatible with chronic microvascular ischemic disease. No evidence of large-territorial acute infarction. No parenchymal hemorrhage. No mass lesion. No extra-axial collection. No mass effect or midline shift. No hydrocephalus. Basilar cisterns are patent. Vascular: No hyperdense vessel. Atherosclerotic calcifications are present within the cavernous internal carotid and vertebral arteries. Skull: No acute fracture or focal lesion. Sinuses/Orbits: Paranasal sinuses and mastoid air cells are clear. Bilateral lens replacement. Otherwise the orbits are unremarkable. Other: None. CT CERVICAL SPINE FINDINGS Alignment: Grade 1 anterolisthesis of C7 on T1. Skull base and vertebrae: Multilevel moderate degenerative changes of the spine most prominent at the C3-C4 level. Multilevel posterior disc osteophyte complex formation. At least moderate osseous foraminal stenosis at the left C4-C5 and C5-C6 level. No severe osseous central canal stenosis. No acute fracture. No aggressive appearing focal osseous lesion or focal pathologic process. Soft tissues and spinal canal: No prevertebral fluid or swelling. No visible canal hematoma. Upper chest: Unremarkable. Other: None. IMPRESSION: 1. No acute intracranial abnormality. 2. No acute displaced  fracture or traumatic listhesis of the cervical spine. 3. Degenerative changes with at least moderate osseous foraminal stenosis at the left C4-C5 and C5-C6 level. Electronically Signed   By: Tish Frederickson M.D.   On: 07/18/2023 16:35   CT Cervical Spine Wo Contrast Result Date: 07/18/2023 CLINICAL DATA:  Head trauma, minor (Age >= 65y) fall; fall EXAM: CT HEAD WITHOUT CONTRAST CT CERVICAL SPINE WITHOUT CONTRAST TECHNIQUE: Multidetector CT imaging of the head and cervical spine was performed following the standard protocol without intravenous contrast. Multiplanar CT image reconstructions of the cervical spine were also generated. RADIATION DOSE REDUCTION: This exam was performed according to the departmental dose-optimization program which includes automated exposure control, adjustment of the mA and/or kV according to patient size and/or use of iterative reconstruction technique. COMPARISON:  None Available. FINDINGS: CT HEAD FINDINGS Brain: Cerebral ventricle sizes are concordant with the degree of cerebral volume loss. Patchy and confluent areas of decreased attenuation are noted throughout the deep and periventricular white matter of the cerebral hemispheres bilaterally, compatible with chronic microvascular ischemic disease. No evidence of large-territorial acute infarction. No parenchymal hemorrhage. No mass lesion. No extra-axial collection. No mass effect or midline shift. No hydrocephalus. Basilar cisterns are patent. Vascular: No hyperdense vessel. Atherosclerotic calcifications are present within the cavernous internal carotid and vertebral arteries. Skull: No acute fracture or focal lesion. Sinuses/Orbits: Paranasal sinuses and mastoid air cells are clear. Bilateral lens replacement. Otherwise the orbits are unremarkable. Other: None. CT CERVICAL SPINE FINDINGS Alignment: Grade 1 anterolisthesis of C7 on T1. Skull base and vertebrae: Multilevel moderate degenerative changes of the spine most  prominent at the C3-C4 level. Multilevel posterior disc osteophyte complex formation. At least moderate osseous foraminal stenosis at the left C4-C5 and C5-C6 level. No severe osseous central canal stenosis. No acute fracture. No aggressive appearing focal osseous lesion or focal pathologic process. Soft tissues and spinal canal: No prevertebral fluid  or swelling. No visible canal hematoma. Upper chest: Unremarkable. Other: None. IMPRESSION: 1. No acute intracranial abnormality. 2. No acute displaced fracture or traumatic listhesis of the cervical spine. 3. Degenerative changes with at least moderate osseous foraminal stenosis at the left C4-C5 and C5-C6 level. Electronically Signed   By: Tish Frederickson M.D.   On: 07/18/2023 16:35   DG HIPS BILAT WITH PELVIS MIN 5 VIEWS Result Date: 07/18/2023 CLINICAL DATA:  Fall in the shower yesterday morning, sacral pain today. Leg weakness. EXAM: DG HIP (WITH OR WITHOUT PELVIS) 5+V BILAT; SACRUM AND COCCYX - 2+ VIEW COMPARISON:  None Available. FINDINGS: Pelvis and hips: Potential nondisplaced right femoral neck fracture. No additional fracture of the pelvis or hips. The bones are subjectively under mineralized. Mild bilateral hip osteoarthritis with joint space narrowing and spurring. Pubic rami are intact. Pubic symphysis is congruent. Sacrum: Cortical margins of the sacrum and coccyx are intact. No displaced sacral fracture. No disruption of sacral ala. Sacroiliac joints are congruent. There are vascular calcifications and phleboliths in the pelvis. IMPRESSION: 1. Potential nondisplaced right femoral neck fracture. Recommend further assessment with CT or MRI. 2. No additional fracture of the pelvis or hips. 3. No sacral/coccygeal fracture. Electronically Signed   By: Narda Rutherford M.D.   On: 07/18/2023 16:27   DG Sacrum/Coccyx Result Date: 07/18/2023 CLINICAL DATA:  Fall in the shower yesterday morning, sacral pain today. Leg weakness. EXAM: DG HIP (WITH OR WITHOUT  PELVIS) 5+V BILAT; SACRUM AND COCCYX - 2+ VIEW COMPARISON:  None Available. FINDINGS: Pelvis and hips: Potential nondisplaced right femoral neck fracture. No additional fracture of the pelvis or hips. The bones are subjectively under mineralized. Mild bilateral hip osteoarthritis with joint space narrowing and spurring. Pubic rami are intact. Pubic symphysis is congruent. Sacrum: Cortical margins of the sacrum and coccyx are intact. No displaced sacral fracture. No disruption of sacral ala. Sacroiliac joints are congruent. There are vascular calcifications and phleboliths in the pelvis. IMPRESSION: 1. Potential nondisplaced right femoral neck fracture. Recommend further assessment with CT or MRI. 2. No additional fracture of the pelvis or hips. 3. No sacral/coccygeal fracture. Electronically Signed   By: Narda Rutherford M.D.   On: 07/18/2023 16:27    Microbiology: No results found for this or any previous visit (from the past 240 hours).   Labs: CBC: Recent Labs  Lab 07/24/23 0400 07/25/23 0451 07/26/23 0840 07/27/23 0240 07/29/23 0347  WBC 8.1 5.0 4.3 4.3 4.2  HGB 12.5 11.8* 12.4 10.9* 10.9*  HCT 37.6 36.1 37.7 33.2* 33.6*  MCV 99.7 99.4 102.7* 101.8* 101.8*  PLT 142* 150 203 215 186   Basic Metabolic Panel: Recent Labs  Lab 07/23/23 0421 07/24/23 0400 07/25/23 0451 07/26/23 0840 07/27/23 0240 07/28/23 1014 07/29/23 0347  NA 133*   < > 128* 130* 132* 132* 131*  K 4.5   < > 4.3 4.4 4.2 5.0 4.3  CL 102   < > 100 101 101 104 101  CO2 25   < > 24 24 25 23 25   GLUCOSE 92   < > 84 103* 87 141* 81  BUN 20   < > 26* 34* 32* 26* 23  CREATININE 0.90   < > 0.77 0.89 0.79 0.76 0.71  CALCIUM 10.0   < > 10.0 10.1 10.0 10.4* 9.6  MG 1.8  --   --  2.3 2.1  --  2.0  PHOS 2.9  --   --  2.7 2.5  --  2.6   < > =  values in this interval not displayed.   Liver Function Tests: No results for input(s): "AST", "ALT", "ALKPHOS", "BILITOT", "PROT", "ALBUMIN" in the last 168 hours. No results for  input(s): "LIPASE", "AMYLASE" in the last 168 hours. No results for input(s): "AMMONIA" in the last 168 hours. Cardiac Enzymes: Recent Labs  Lab 07/27/23 1139  CKTOTAL 105   BNP (last 3 results) Recent Labs    04/06/23 1514 07/18/23 2208  BNP 76.1 104.4*   CBG: Recent Labs  Lab 07/28/23 0803 07/28/23 1151 07/28/23 1655 07/28/23 2158 07/29/23 0729  GLUCAP 75 111* 106* 82 86    Time spent: 35 minutes  Signed:  Gillis Santa  [facility] 07/29/2023 10:40 AM

## 2023-08-19 ENCOUNTER — Telehealth: Payer: Self-pay | Admitting: Internal Medicine

## 2023-08-19 ENCOUNTER — Ambulatory Visit (INDEPENDENT_AMBULATORY_CARE_PROVIDER_SITE_OTHER): Admitting: Neurosurgery

## 2023-08-19 VITALS — BP 122/82 | Ht 66.0 in | Wt 205.0 lb

## 2023-08-19 DIAGNOSIS — M1990 Unspecified osteoarthritis, unspecified site: Secondary | ICD-10-CM

## 2023-08-19 DIAGNOSIS — M47812 Spondylosis without myelopathy or radiculopathy, cervical region: Secondary | ICD-10-CM | POA: Diagnosis not present

## 2023-08-19 NOTE — Telephone Encounter (Signed)
 I received a notification from Dr Ernestine Mcmurray regarding Nina Perez. Please call and see if she has been discharged from SNF. If not discharged, please see how she is doing and if discharge is planned soon. Also, need to know if she has a neurology appt scheduled. On her discharge summary, they had recommended a /u with neurology. Also, if she has had las in the facility, I need a copy of these labs.  Thanks

## 2023-08-19 NOTE — Progress Notes (Signed)
 Thank you for reaching out. In reviewing her records, it appears she was discharged to a skilled nursing facility. When pts are discharged to skilled nursing, the physician at the nursing home follows them and places orders, etc - until they are discharged to home. After discharge to home, the PCP resumes care.  I will have my nurse call and see if we can get information regarding neurology follow up and to see if any plans for discharge soon.  I am ok if you feel a referral is needed. Thank you again for seeing her and for reaching out. I appreciate it.

## 2023-08-19 NOTE — Progress Notes (Signed)
     HISTORY OF PRESENT ILLNESS: 08/19/2023 [name] was seen in consultation on 07/26/2023.  She is here today for follow-up.  She states that her neck pain and arm pain have improved significantly.  She has not noticed much functional improvement in her left upper extremity which continues to have significant weakness.  She has not yet followed up with neurology.  Is followed up with cardiology.  Continues to see her PCP.  PHYSICAL EXAMINATION:   Vitals:   08/19/23 1141  BP: 122/82   General: Patient is well developed, well nourished, calm, collected, and in no apparent distress.  NEUROLOGICAL:  General: In no acute distress.  Awake, alert, oriented to person, place, and time. Pupils equal round and reactive to light.   Strength: Continues to have weakness that significant in her deltoid and hand but has maintained biceps and triceps function with minimal changes.  Stable since her last follow-up.  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  Her physical exam at that time was consistent more with an upper motor neuron pathology rather than a multi level radiculopathy skipping the C6 and C7 myotomes.  We did discuss that she has significant arthritis and some cases we can offer her cervical decompression to aid in neurologic recovery.  She states that she would not want to have any cervical spine surgery, at this point has no neck pain.  She states that even if I was able to improve some of her motor function that she would still not entertain an idea of going forward with spine surgery.  She can continue to follow as needed.  Spent a total of 20 minutes on  her care today, evaluating her charts, coordinating care, face-to-face evaluation, and discussion with her and her family on plans going forward.  Lovenia Kim, MD/MS Department of Neurosurgery

## 2023-08-20 NOTE — Telephone Encounter (Signed)
 Left voicemail to return call.

## 2023-08-21 NOTE — Telephone Encounter (Signed)
 Copied from CRM [id number]. Topic: General - Other >> Aug 21, 2023  9:31 AM Godfrey Pick wrote: Reason for CRM: Patient husband(Ronald Slape) return call to the office per request from CMA. Windy Fast states patient was discharged from SNF on 07/29/23. Patient is currently in [facility] in [place]. Patient doesn't have a neurology appointment scheduled. Patient not 100% sure if patient had labs completed at the facility but stated he was sure she did. Idali can be contacted direct at her room number provided by Windy Fast at [phone]

## 2023-08-21 NOTE — Telephone Encounter (Signed)
 FYI- called and spoke with patient. She is doing ok. She is at [facility] with no discharge date as of right now. She says that she has not had any labs done at facility. She does have a neurology appt scheduled with Dr Malvin Johns 5/28.

## 2023-08-22 NOTE — Telephone Encounter (Signed)
 Called and spoke to Nina Perez. She is still in rehab, getting therapy 2x/day most days. Working to get her legs stronger. Taking her blood thinners. Has appt scheduled with neurology. Contact neurology - see if can work in for earlier appt.

## 2023-08-24 NOTE — Addendum Note (Signed)
 Encounter addended by: Dudley Ghee, RN on: 08/24/2023 4:01 PM  Actions taken: Imaging Exam ended

## 2023-08-26 ENCOUNTER — Telehealth: Payer: Self-pay | Admitting: Internal Medicine

## 2023-08-26 NOTE — Telephone Encounter (Signed)
 Called neurology for earlier appt. See previous note.  Appt changed to 09/02/23 - 8:45 (Dr Achille Ache). Ms Pelot notified.  Also notified Courtney at [employer] and she will notify transportation.

## 2023-08-27 DIAGNOSIS — I639 Cerebral infarction, unspecified: Secondary | ICD-10-CM | POA: Diagnosis not present

## 2023-08-28 ENCOUNTER — Other Ambulatory Visit: Payer: Self-pay | Admitting: *Deleted

## 2023-08-28 DIAGNOSIS — I639 Cerebral infarction, unspecified: Secondary | ICD-10-CM

## 2023-09-18 ENCOUNTER — Encounter (HOSPITAL_COMMUNITY): Payer: Self-pay

## 2023-09-24 ENCOUNTER — Telehealth: Payer: Self-pay

## 2023-09-24 ENCOUNTER — Other Ambulatory Visit: Payer: Self-pay | Admitting: Internal Medicine

## 2023-09-24 MED ORDER — CITALOPRAM HYDROBROMIDE 10 MG PO TABS: 10.0000 mg | ORAL_TABLET | Freq: Every day | ORAL | 2 refills | Status: DC

## 2023-09-24 NOTE — Telephone Encounter (Signed)
 Copied from CRM [id number]. Topic: Clinical - Medication Refill >> Sep 24, 2023  8:58 AM [place] A wrote: Medication: citalopram (CELEXA) 10 MG tablet  Has the patient contacted their pharmacy? Yes (Agent: If no, request that the patient contact the pharmacy for the refill. If patient does not wish to contact the pharmacy document the reason why and proceed with request.) (Agent: If yes, when and what did the pharmacy advise?)  This is the patient's preferred pharmacy:  [facility] [address] [place] - [address] [place] [address] Phone: [phone] Fax: [phone]   Is this the correct pharmacy for this prescription? Yes If no, delete pharmacy and type the correct one.   Has the prescription been filled recently? No  Is the patient out of the medication? No  Has the patient been seen for an appointment in the last year OR does the patient have an upcoming appointment? Yes  Can we respond through MyChart? Yes  Agent: Please be advised that Rx refills may take up to 3 business days. We ask that you follow-up with your pharmacy.

## 2023-09-24 NOTE — Telephone Encounter (Signed)
 Rx sent in for citalopram. Please make sure TCM call is completed.

## 2023-09-24 NOTE — Transitions of Care (Post Inpatient/ED Visit) (Signed)
 09/24/2023  Name: Nina Perez MRN: [id number] DOB: 1940-06-09  Today's TOC FU Call Status: Today's TOC FU Call Status:: Successful TOC FU Call Completed TOC FU Call Complete Date: 09/24/23 Patient's Name and Date of Birth confirmed.  Transition Care Management Follow-up Telephone Call Date of Discharge: 09/23/23 Discharge Facility: Other [facility]) Name of Other ([facility]) Discharge Facility: [facility] in [place] Type of Discharge: Inpatient Admission Primary Inpatient Discharge Diagnosis:: CVA How have you been since you were released from the hospital?: Better Any questions or concerns?: No  Items Reviewed: Did you receive and understand the discharge instructions provided?: Yes Medications obtained,verified, and reconciled?: Yes (Medications Reviewed) Any new allergies since your discharge?: No Dietary orders reviewed?: Yes  Medications Reviewed Today: Medications Reviewed Today     Reviewed by Darrall Ellison, LPN (Licensed Practical Nurse) on 09/24/23 at 1603  Med List Status: <None>   Medication Order Taking? Sig Documenting Provider Last Dose Status Informant  acetaminophen  (TYLENOL ) 650 MG CR tablet [id number] No Take 650-1,300 mg by mouth every 8 (eight) hours as needed for pain. [provider] Taking Active Self  allopurinol  (ZYLOPRIM ) 100 MG tablet [id number] No Take 1 tablet (100 mg total) by mouth daily. Dellar Fenton, MD Taking Active Self  amLODipine  (NORVASC ) 5 MG tablet [id number] No Take 1 tablet (5 mg total) by mouth daily. Dellar Fenton, MD Taking Active Self  aspirin  EC 81 MG tablet [id number] No Take 1 tablet (81 mg total) by mouth daily. Swallow whole. Melvinia Stager, MD Taking Active Self  atorvastatin  (LIPITOR ) 80 MG tablet [id number] No Take 0.5 tablets (40 mg total) by mouth daily. Althia Atlas, MD Taking Active   bisacodyl  (DULCOLAX) 10 MG suppository [id number] No Place 1 suppository (10 mg total) rectally daily as needed for severe  constipation. Althia Atlas, MD Taking Active   bisacodyl  (DULCOLAX) 5 MG EC tablet [id number] No Take 2 tablets (10 mg total) by mouth at bedtime. Althia Atlas, MD Taking Active   citalopram (CELEXA) 10 MG tablet [id number]  Take 1 tablet (10 mg total) by mouth daily. Dellar Fenton, MD  Active   clopidogrel  (PLAVIX ) 75 MG tablet [id number] No Take 1 tablet (75 mg total) by mouth daily with breakfast. Dellar Fenton, MD Taking Active Self  fexofenadine  (ALLEGRA ) 180 MG tablet [id number] No Take 1 tablet (180 mg total) by mouth daily.  Patient taking differently: Take 180 mg by mouth daily as needed for allergies.   Kent Pear, NP Taking Active Self  folic acid  (FOLVITE ) 1 MG tablet [id number] No Take 1 mg by mouth every evening.  [provider] Taking Active Self  glucose blood (BAYER CONTOUR TEST) test strip [id number] No USE  STRIP TO CHECK GLUCOSE TWICE DAILY. Dx: E11.9 Dellar Fenton, MD Taking Active Self  ipratropium (ATROVENT ) 0.06 % nasal spray [id number] No Place 2 sprays into both nostrils 4 (four) times daily.  Patient taking differently: Place 2 sprays into both nostrils 4 (four) times daily as needed for rhinitis.   Kent Pear, NP Taking Active Self  levothyroxine (SYNTHROID) 25 MCG tablet [id number] No Take 25 mcg by mouth daily before breakfast. [provider] Taking Active   lisinopril  (ZESTRIL ) 10 MG tablet [id number] No Take 10 mg by mouth daily. [provider] Taking Active   magnesium  oxide (MAG-OX) 400 MG tablet [id number] No Take 1 tablet (400 mg total) by mouth 2 (two) times daily. Dellar Fenton, MD Taking Active Self  Menthol -Zinc Oxide (CALMOSEPTINE) 0.44-20.6 % OINT [id number] No Apply topically daily  as needed. [provider] Taking Active   methocarbamol  (ROBAXIN ) 500 MG tablet [id number] No Take 1 tablet (500 mg total) by mouth every 8 (eight) hours as needed for muscle spasms. Althia Atlas, MD Taking Active   methotrexate   [facility]) 2.5 MG tablet [id number] No Take 10 mg by mouth every Wednesday. [provider] Taking Active Self  metoprolol  succinate (TOPROL -XL) 25 MG 24 hr tablet [id number] No Take 1 tablet (25 mg total) by mouth daily. Take with or immediately following a meal. Althia Atlas, MD Taking Active   MICROLET LANCETS MISC [id number] No Check sugar once daily, [facility] Microlet Lancets. Dx E11.9 Dellar Fenton, MD Taking Active Self  nitroGLYCERIN  (NITROSTAT ) 0.4 MG SL tablet [id number] No Place 1 tablet (0.4 mg total) under the tongue every 5 (five) minutes x 3 doses as needed for chest pain. Melvinia Stager, MD Taking Active Self  nystatin  powder [id number] No Apply 1 Application topically 3 (three) times daily. [provider] Taking Active   pantoprazole  (PROTONIX ) 40 MG tablet [id number] No Take 1 tablet (40 mg total) by mouth daily. Dellar Fenton, MD Taking Active Self  polyethylene glycol (MIRALAX  / GLYCOLAX ) 17 g packet [id number] No Take 17 g by mouth 2 (two) times daily. Althia Atlas, MD Taking Active   sodium chloride  1 g tablet [id number] No Take 1 g by mouth 3 (three) times daily. [provider] Taking Active   traMADol  (ULTRAM ) 50 MG tablet [id number] No Take 1 tablet (50 mg total) by mouth every 8 (eight) hours as needed. Althia Atlas, MD Taking Active             Home Care and Equipment/Supplies: Were Home Health Services Ordered?: Yes Name of Home Health Agency:: unknown Has Agency set up a time to come to your home?: No Any new equipment or medical supplies ordered?: Yes Name of Medical supply agency?: unknown Were you able to get the equipment/medical supplies?: Yes Do you have any questions related to the use of the equipment/supplies?: No  Functional Questionnaire: Do you need assistance with bathing/showering or dressing?: Yes Do you need assistance with meal preparation?: Yes Do you need assistance with eating?: No Do you have difficulty  maintaining continence: No Do you need assistance with getting out of bed/getting out of a chair/moving?: Yes Do you have difficulty managing or taking your medications?: Yes  Follow up appointments reviewed: PCP Follow-up appointment confirmed?: Yes Date of PCP follow-up appointment?: 10/09/23 Follow-up Provider: [facility] Follow-up appointment confirmed?: NA Do you need transportation to your follow-up appointment?: No Do you understand care options if your condition(s) worsen?: Yes-patient verbalized understanding    SIGNATURE Darrall Ellison, LPN [facility] Nurse Health Advisor Direct Dial [phone]

## 2023-09-24 NOTE — Telephone Encounter (Signed)
 Need to clarify if she is taking and confirm dose taking. This states historical medication. Also, need to know if she is out of rehab.  They are currently giving her the medications. If home, need to know when discharged and need to schedule f/u with me.

## 2023-09-24 NOTE — Telephone Encounter (Signed)
 Historical medication Last OV: 06/26/2023 Next appt: not scheduled

## 2023-09-24 NOTE — Telephone Encounter (Signed)
 Patient is taking this medication 10 mg q day. She came home from rehab yesterday. Appt has been scheduled.

## 2023-09-25 ENCOUNTER — Ambulatory Visit: Payer: Medicare Other | Admitting: Internal Medicine

## 2023-09-25 NOTE — Telephone Encounter (Signed)
 Pt aware. Scheduled for 5/27

## 2023-09-29 ENCOUNTER — Encounter (INDEPENDENT_AMBULATORY_CARE_PROVIDER_SITE_OTHER): Payer: Self-pay

## 2023-10-06 ENCOUNTER — Ambulatory Visit: Admitting: Internal Medicine

## 2023-10-06 VITALS — BP 128/70 | HR 68 | Ht 66.0 in | Wt 205.0 lb

## 2023-10-06 DIAGNOSIS — M341 CR(E)ST syndrome: Secondary | ICD-10-CM

## 2023-10-06 DIAGNOSIS — E7800 Pure hypercholesterolemia, unspecified: Secondary | ICD-10-CM | POA: Diagnosis not present

## 2023-10-06 DIAGNOSIS — I429 Cardiomyopathy, unspecified: Secondary | ICD-10-CM | POA: Diagnosis not present

## 2023-10-06 DIAGNOSIS — E1169 Type 2 diabetes mellitus with other specified complication: Secondary | ICD-10-CM

## 2023-10-06 DIAGNOSIS — N281 Cyst of kidney, acquired: Secondary | ICD-10-CM

## 2023-10-06 DIAGNOSIS — Z89422 Acquired absence of other left toe(s): Secondary | ICD-10-CM

## 2023-10-06 DIAGNOSIS — R531 Weakness: Secondary | ICD-10-CM

## 2023-10-06 DIAGNOSIS — K2100 Gastro-esophageal reflux disease with esophagitis, without bleeding: Secondary | ICD-10-CM

## 2023-10-06 DIAGNOSIS — M069 Rheumatoid arthritis, unspecified: Secondary | ICD-10-CM

## 2023-10-06 DIAGNOSIS — E785 Hyperlipidemia, unspecified: Secondary | ICD-10-CM | POA: Diagnosis not present

## 2023-10-06 DIAGNOSIS — D696 Thrombocytopenia, unspecified: Secondary | ICD-10-CM

## 2023-10-06 DIAGNOSIS — I2510 Atherosclerotic heart disease of native coronary artery without angina pectoris: Secondary | ICD-10-CM | POA: Diagnosis not present

## 2023-10-06 DIAGNOSIS — N1831 Chronic kidney disease, stage 3a: Secondary | ICD-10-CM

## 2023-10-06 DIAGNOSIS — D1771 Benign lipomatous neoplasm of kidney: Secondary | ICD-10-CM

## 2023-10-06 DIAGNOSIS — I10 Essential (primary) hypertension: Secondary | ICD-10-CM

## 2023-10-06 DIAGNOSIS — I272 Pulmonary hypertension, unspecified: Secondary | ICD-10-CM

## 2023-10-06 DIAGNOSIS — R29898 Other symptoms and signs involving the musculoskeletal system: Secondary | ICD-10-CM

## 2023-10-06 DIAGNOSIS — I700 Atherosclerosis of aorta: Secondary | ICD-10-CM | POA: Diagnosis not present

## 2023-10-06 LAB — LIPID PANEL
Cholesterol: 98 mg/dL (ref 0–200)
HDL: 38.8 mg/dL — ABNORMAL LOW (ref >39.00)
LDL Cholesterol: 38 mg/dL (ref 0–99)
NonHDL: 59.01
Total CHOL/HDL Ratio: 3
Triglycerides: 104 mg/dL (ref 0.0–149.0)
VLDL: 20.8 mg/dL (ref 0.0–40.0)

## 2023-10-06 LAB — BASIC METABOLIC PANEL WITH GFR
BUN: 26 mg/dL — ABNORMAL HIGH (ref 6–23)
CO2: 27 meq/L (ref 19–32)
Calcium: 10.1 mg/dL (ref 8.4–10.5)
Chloride: 106 meq/L (ref 96–112)
Creatinine, Ser: 1.02 mg/dL (ref 0.40–1.20)
GFR: 51.28 mL/min — ABNORMAL LOW (ref >60.00)
Glucose, Bld: 85 mg/dL (ref 70–99)
Potassium: 4.4 meq/L (ref 3.5–5.1)
Sodium: 139 meq/L (ref 135–145)

## 2023-10-06 LAB — HEPATIC FUNCTION PANEL
ALT: 17 U/L (ref 0–35)
AST: 19 U/L (ref 0–37)
Albumin: 3.5 g/dL (ref 3.5–5.2)
Alkaline Phosphatase: 91 U/L (ref 39–117)
Bilirubin, Direct: 0.1 mg/dL (ref 0.0–0.3)
Total Bilirubin: 0.5 mg/dL (ref 0.2–1.2)
Total Protein: 6.4 g/dL (ref 6.0–8.3)

## 2023-10-06 LAB — CBC WITH DIFFERENTIAL/PLATELET
Basophils Absolute: 0 10*3/uL (ref 0.0–0.1)
Basophils Relative: 0.6 % (ref 0.0–3.0)
Eosinophils Absolute: 0.1 10*3/uL (ref 0.0–0.7)
Eosinophils Relative: 1.6 % (ref 0.0–5.0)
HCT: 37.1 % (ref 36.0–46.0)
Hemoglobin: 11.7 g/dL — ABNORMAL LOW (ref 12.0–15.0)
Lymphocytes Relative: 23.5 % (ref 12.0–46.0)
Lymphs Abs: 1.1 10*3/uL (ref 0.7–4.0)
MCHC: 31.7 g/dL (ref 30.0–36.0)
MCV: 94.8 fl (ref 78.0–100.0)
Monocytes Absolute: 0.3 10*3/uL (ref 0.1–1.0)
Monocytes Relative: 6.7 % (ref 3.0–12.0)
Neutro Abs: 3.2 10*3/uL (ref 1.4–7.7)
Neutrophils Relative %: 67.6 % (ref 43.0–77.0)
Platelets: 138 10*3/uL — ABNORMAL LOW (ref 150.0–400.0)
RBC: 3.91 Mil/uL (ref 3.87–5.11)
RDW: 17.6 % — ABNORMAL HIGH (ref 11.5–15.5)
WBC: 4.7 10*3/uL (ref 4.0–10.5)

## 2023-10-06 LAB — HEMOGLOBIN A1C: Hgb A1c MFr Bld: 5.8 % (ref 4.6–6.5)

## 2023-10-06 NOTE — Assessment & Plan Note (Signed)
Echocardiogram 11/2022 - EF 50-55% with grade III DD, right and left atrium moderately dilated with mild MR and mild AR. Continue spironolactone, metoprolol, losartan.

## 2023-10-06 NOTE — Assessment & Plan Note (Signed)
 Continue lipitor.  Low cholesterol diet and exercise.  Follow lipid panel and liver function tests.

## 2023-10-06 NOTE — Assessment & Plan Note (Signed)
 Avoid antiinflammatories.  Stay hydrated.  Follow metabolic panel. Continue losartan. Check metabolic panel today.

## 2023-10-06 NOTE — Assessment & Plan Note (Signed)
 Dr Lonna Cobb (01/30/22) - stable.  F/u one year. Just had f/u renal ultrasound 02/2023 - no interval change.

## 2023-10-06 NOTE — Progress Notes (Signed)
 Subjective:    Patient ID: Nina Perez, female    DOB: 03-12-1941, 83 y.o.   MRN: [id number]  Patient here for  Chief Complaint  Patient presents with   Hospitalization Follow-up    HPI Here for hospital follow up. Discharged from [facility] 09/23/23. 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 Also had multiple falls prior to admission. Was on aspirin  and plavix . Residual left arm and leg weakness. MRI c-spine - mild spinal canal stenosis at C4-5 and C5-6, severe left C4-5 and C5-6 neural foraminal stenosis. CT angio head and neck - no large vessel occlusion, enlarged main pulmonary arteries, aortic atherosclerosis. MRI brain - many punctate acute infarcts in the right > left frontal and parietal lobes and the cerebellum. Saw neurology - 09/02/23 - recommended to continue aspirin  and plavix . Hold on medication for memory. Recommended f/u with cardiology - f/u cardiac monitor revealed normal rhythm with average heart rate 65 bpm, no afib or aflutter, one run SVT. Recommended EP referral to consider ILR. Per review, cardiac monitor revealed normal rhythm with average heart rate 65, no afib or aflutter, one run of fast heart rhythm and rare irregular beats. Discussed today. Wants to hold on referral at this point. Continue PT.  Had f/u with [facility] 08/19/23 - f/u regarding c-spine arthritis, etc. Elected to follow. She is accompanied by her husband and daughter today. At home. Having to use a sit to stand lift. She is unable to stand on her own. [facility] transport. Asking if we can refill her methotrexate  for now - due to being hard to get to appts. Breathing stable. Eating. No abdominal pain.    Past Medical History:  Diagnosis Date   Adenomatous colon polyp    Aortic atherosclerosis (HCC)    Barrett's esophagus    Cardiac murmur    CKD (chronic kidney disease), stage III (HCC)    Dyspnea on exertion    GERD (gastroesophageal reflux disease)    Gout    Hiatal hernia    History of 2019 novel coronavirus  disease (COVID-19) 08/2020   Hypercholesterolemia    Hypertension    ILD (interstitial lung disease) (HCC)    a.) 2/2 scleroderma Dx   LBBB (left bundle branch block)    Long term current use of antithrombotics/antiplatelets    a.) ASA + clopidogrel    Long term current use of immunosuppressive drug    a.) MTX for RA and scleroderma Dx   OSA on CPAP    Osteoarthritis    a.) knees, spine   PAD (peripheral artery disease) (HCC)    Phlebitis    a.) x 2 (with pregnancy)   Pulmonary fibrosis (HCC)    a.) mild   Pulmonary hypertension (HCC) 02/24/2012   a.) TTE 02/24/2012: EF 55%, RVSP 33; b.) TTE 10/12/2014: EF >55%, RVSP 36.1; c.) TTE 09/02/2017: EF 55%, RVSP 45.5   Reflux esophagitis    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 b.) renal US  05/23/2020: solid LEFT renal mass measuring 1.4 x 1.5 x 1.6. 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, sclerodactyly, telangiectasias   Skin cancer 2018   Supplemental oxygen dependent    a.)  2L/[place] PRN for DOE related to mild pulmonary fibrosis and pulmonary HTN   T2DM (type 2 diabetes mellitus) (HCC)    Past Surgical History:  Procedure Laterality Date   ABDOMINAL AORTOGRAM W/LOWER EXTREMITY Left 05/16/2019   Procedure: ABDOMINAL AORTOGRAM W/LOWER EXTREMITY;  Surgeon: Avanell Leigh, MD;  [facility];  Service: Cardiovascular;  Laterality: Left;   AMPUTATION TOE Left 06/02/2019   Procedure: AMPUTATION TOE;  Surgeon: Sharlyn Deaner, DPM;  Location: [facility] OR[facility];  Service: Podiatry;  Laterality: Left;   APPENDECTOMY  1972   BREAST CYST ASPIRATION Left    neg   COLONOSCOPY WITH PROPOFOL  N/A 09/26/2021   Procedure: COLONOSCOPY WITH PROPOFOL ;   Surgeon: Quintin Buckle, DO;  Location: [facility] ENDOSCOPY;  Service: Gastroenterology;  Laterality: N/A;  DM, WHEELCHAIR, PLAVIX    CORONARY STENT INTERVENTION N/A 11/24/2022   Procedure: CORONARY STENT INTERVENTION;  Surgeon: Wenona Hamilton, MD;  Location: [facility];  Service: Cardiovascular;  Laterality: N/A;   ESOPHAGOGASTRODUODENOSCOPY (EGD) WITH PROPOFOL  N/A 09/26/2021   Procedure: ESOPHAGOGASTRODUODENOSCOPY (EGD) WITH PROPOFOL ;  Surgeon: Quintin Buckle, DO;  Location: [facility] ENDOSCOPY;  Service: Gastroenterology;  Laterality: N/A;   INCISION AND DRAINAGE OF WOUND Left 06/07/2019   Procedure: IRRIGATION AND DEBRIDEMENT WOUND;  Surgeon: Sharlyn Deaner, DPM;  Location: [facility] OR[facility];  Service: Podiatry;  Laterality: Left;   KNEE ARTHROPLASTY Right 11/13/2021   Procedure: COMPUTER ASSISTED TOTAL KNEE ARTHROPLASTY;  Surgeon: Arlyne Lame, MD;  Location: [facility] ORS;  Service: Orthopedics;  Laterality: Right;   LEFT HEART CATH AND CORONARY ANGIOGRAPHY Right 11/24/2022   Procedure: LEFT HEART CATH AND CORONARY ANGIOGRAPHY with possible coronary intervention;  Surgeon: Cherrie Cornwall, MD;  Location: [facility];  Service: Cardiovascular;  Laterality: Right;   LOWER EXTREMITY ANGIOGRAPHY Left 06/03/2019   Procedure: Lower Extremity Angiography;  Surgeon: Jackquelyn Mass, MD;  Location: [facility];  Service: Cardiovascular;  Laterality: Left;   LOWER EXTREMITY ANGIOGRAPHY Right 05/21/2021   Procedure: LOWER EXTREMITY ANGIOGRAPHY;  Surgeon: Jackquelyn Mass, MD;  Location: [facility];  Service: Cardiovascular;  Laterality: Right;   PERIPHERAL VASCULAR BALLOON ANGIOPLASTY Left 05/16/2019   Procedure: PERIPHERAL VASCULAR BALLOON ANGIOPLASTY;  Surgeon: Avanell Leigh, MD;  Location: [facility];  Service: Cardiovascular;  Laterality: Left;  SFA   TUBAL LIGATION  1972   VEIN LIGATION AND STRIPPING     Family History  Problem Relation Age of Onset    Arthritis Mother    Heart attack Father    Cancer Sister    Cancer - Prostate Brother    Colon cancer Neg Hx    Breast cancer Neg Hx    Social History   Socioeconomic History   Marital status: Married    Spouse name: [name]   Number of children: 2   Years of education: Not on file   Highest education level: Not on file  Occupational History   Occupation: retired    Comment: Market researcher  Tobacco Use   Smoking status: Former    Current packs/day: 0.00    Average packs/day: 0.5 packs/day for 15.0 years (7.5 ttl pk-yrs)    Types: Cigarettes    Start date: 05/12/1973    Quit date: 05/12/1988    Years since quitting: 35.4   Smokeless tobacco: Never  Vaping Use   Vaping status: Never Used  Substance and Sexual Activity   Alcohol use: No    Alcohol/week: 0.0 standard drinks of alcohol  Drug use: No   Sexual activity: Yes  Other Topics Concern   Not on file  Social History Narrative   Lives at home with spouse Kendra Pavy    Social Drivers of Health   Financial Resource Strain: Low Risk  (09/08/2023)   Received from [facility]   Overall Financial Resource Strain (CARDIA)    Difficulty of Paying Living Expenses: Not hard at all  Food Insecurity: No Food Insecurity (09/08/2023)   Received from [facility]   Hunger Vital Sign    Worried About Running Out of Food in the Last Year: Never true    Ran Out of Food in the Last Year: Never true  Transportation Needs: No Transportation Needs (09/08/2023)   Received [facility] - Transportation    In the past 12 months, has lack of transportation kept you from medical appointments or from getting medications?: No    Lack of Transportation (Non-Medical): No  Physical Activity: Unknown (01/15/2023)   Exercise Vital Sign    Days of Exercise per Week: 7 days    Minutes of Exercise per Session: Not on file  Stress: No Stress Concern Present (01/15/2023)   [employer] of [employer]ational [employer] - Occupational Stress Questionnaire    Feeling of Stress : Not at all  Social Connections: Moderately Integrated (07/20/2023)   Social Connection and Isolation Panel [NHANES]    Frequency of Communication with Friends and Family: More than three times a week    Frequency of Social Gatherings with Friends and Family: Twice a week    Attends Religious Services: More than 4 times per year    Active Member of [employer] or Organizations: No    Attends Banker Meetings: Never    Marital Status: Married     Review of Systems  Constitutional:  Negative for appetite change and fever.  HENT:  Negative for congestion and sinus pressure.   Respiratory:  Negative for cough and chest tightness.        Breathing stable.   Cardiovascular:  Negative for chest pain and palpitations.       No increased swelling. Stable.   Gastrointestinal:  Negative for abdominal pain, diarrhea, nausea and vomiting.  Genitourinary:  Negative for difficulty urinating and dysuria.  Musculoskeletal:  Negative for joint swelling and myalgias.  Skin:  Negative for color change and rash.  Neurological:  Negative for dizziness and headaches.  Psychiatric/Behavioral:  Negative for agitation and dysphoric mood.        Objective:     BP 128/70   Pulse 68   Ht 5\' 6"  (1.676 m)   Wt 205 lb (93 kg)   LMP 05/25/1988   SpO2 98%   BMI 33.09 kg/m  Wt Readings from Last 3 Encounters:  10/06/23 205 lb (93 kg)  08/19/23 205 lb (93 kg)  07/29/23 205 lb 11 oz (93.3 kg)    Physical Exam Vitals reviewed.  Constitutional:      General: She is not in acute distress.    Appearance: Normal appearance.  HENT:     Head: Normocephalic and atraumatic.     Right Ear: External ear normal.     Left Ear: External ear normal.     Mouth/Throat:     Pharynx: No oropharyngeal exudate or posterior oropharyngeal erythema.  Eyes:     General: No scleral icterus.       Right eye: No discharge.  Left eye: No discharge.     Conjunctiva/sclera: Conjunctivae normal.  Neck:     Thyroid : No thyromegaly.  Cardiovascular:     Rate and Rhythm: Normal rate and regular rhythm.  Pulmonary:     Effort: No respiratory distress.     Breath sounds: Normal breath sounds. No wheezing.  Abdominal:     General: Bowel sounds are normal.     Palpations: Abdomen is soft.     Tenderness: There is no abdominal tenderness.  Musculoskeletal:        General: No swelling or tenderness.     Cervical back: Neck supple. No tenderness.  Lymphadenopathy:     Cervical: No cervical adenopathy.  Skin:    Findings: No erythema or rash.  Neurological:     Mental Status: She is alert.  Psychiatric:        Mood and Affect: Mood normal.        Behavior: Behavior normal.         Outpatient Encounter Medications as of 10/06/2023  Medication Sig   acetaminophen  (TYLENOL ) 650 MG CR tablet Take 650-1,300 mg by mouth every 8 (eight) hours as needed for pain.   allopurinol  (ZYLOPRIM ) 100 MG tablet Take 1 tablet (100 mg total) by mouth daily.   amLODipine  (NORVASC ) 5 MG tablet Take 1 tablet (5 mg total) by mouth daily.   aspirin  EC 81 MG tablet Take 1 tablet (81 mg total) by mouth daily. Swallow whole.   atorvastatin  (LIPITOR ) 80 MG tablet Take 0.5 tablets (40 mg total) by mouth daily.   bisacodyl  (DULCOLAX) 10 MG suppository Place 1 suppository (10 mg total) rectally daily as needed for severe constipation.   bisacodyl  (DULCOLAX) 5 MG EC tablet Take 2 tablets (10 mg total) by mouth at bedtime.   citalopram  (CELEXA ) 10 MG tablet Take 1 tablet (10 mg total) by mouth daily.   clopidogrel  (PLAVIX ) 75 MG tablet Take 1 tablet (75 mg total) by mouth daily with breakfast.   fexofenadine  (ALLEGRA ) 180 MG tablet Take 1 tablet (180 mg total) by mouth daily. (Patient taking differently: Take 180 mg by mouth daily as needed for allergies.)   folic acid  (FOLVITE ) 1 MG tablet Take 1 mg by mouth every evening.    glucose  blood (BAYER CONTOUR TEST) test strip USE  STRIP TO CHECK GLUCOSE TWICE DAILY. Dx: E11.9   ipratropium (ATROVENT ) 0.06 % nasal spray Place 2 sprays into both nostrils 4 (four) times daily. (Patient taking differently: Place 2 sprays into both nostrils 4 (four) times daily as needed for rhinitis.)   levothyroxine (SYNTHROID) 25 MCG tablet Take 25 mcg by mouth daily before breakfast.   lisinopril  (ZESTRIL ) 10 MG tablet Take 10 mg by mouth daily.   magnesium  oxide (MAG-OX) 400 MG tablet Take 1 tablet (400 mg total) by mouth 2 (two) times daily.   Menthol -Zinc Oxide (CALMOSEPTINE) 0.44-20.6 % OINT Apply topically daily as needed.   methocarbamol  (ROBAXIN ) 500 MG tablet Take 1 tablet (500 mg total) by mouth every 8 (eight) hours as needed for muscle spasms.   methotrexate  (RHEUMATREX) 2.5 MG tablet Take 10 mg by mouth every Wednesday.   metoprolol  succinate (TOPROL -XL) 25 MG 24 hr tablet Take 1 tablet (25 mg total) by mouth daily. Take with or immediately following a meal.   MICROLET LANCETS MISC Check sugar once daily, [employer] Microlet Lancets. Dx E11.9   nitroGLYCERIN  (NITROSTAT ) 0.4 MG SL tablet Place 1 tablet (0.4 mg total) under the tongue every 5 (five) minutes x  3 doses as needed for chest pain.   nystatin  powder Apply 1 Application topically 3 (three) times daily.   pantoprazole  (PROTONIX ) 40 MG tablet Take 1 tablet (40 mg total) by mouth daily.   polyethylene glycol (MIRALAX  / GLYCOLAX ) 17 g packet Take 17 g by mouth 2 (two) times daily.   sodium chloride  1 g tablet Take 1 g by mouth 3 (three) times daily.   traMADol  (ULTRAM ) 50 MG tablet Take 1 tablet (50 mg total) by mouth every 8 (eight) hours as needed.   No facility-administered encounter medications on file as of 10/06/2023.    Medications reviewed and reconciled with pt and family.   Lab Results  Component Value Date   WBC 4.7 10/06/2023   HGB 11.7 (L) 10/06/2023   HCT 37.1 10/06/2023   PLT 138.0 (L) 10/06/2023   GLUCOSE 85  10/06/2023   CHOL 98 10/06/2023   TRIG 104.0 10/06/2023   HDL 38.80 (L) 10/06/2023   LDLCALC 38 10/06/2023   ALT 17 10/06/2023   AST 19 10/06/2023   NA 139 10/06/2023   K 4.4 10/06/2023   CL 106 10/06/2023   CREATININE 1.02 10/06/2023   BUN 26 (H) 10/06/2023   CO2 27 10/06/2023   TSH 4.172 07/20/2023   INR 1.2 07/18/2023   HGBA1C 5.8 10/06/2023   MICROALBUR 6.3 (H) 06/24/2023      Assessment & Plan:  Type 2 diabetes mellitus with hyperlipidemia (HCC) Assessment & Plan: Off metformin . Continue low carb diet.  Discussed exercise as tolerated. Follow met b and A1c.   Orders: -     Hemoglobin A1c  Aortic atherosclerosis (HCC) Assessment & Plan: Continue lipitor .    Coronary artery disease involving native coronary artery of native heart without angina pectoris Assessment & Plan: Cardiology (Dr Junnie Olives). CAD s/p PCI to RV marginal branch 11/2022 (70% OM1, 80% second diagonal).  EF 50 to 55%.  Continue aspirin , Plavix , Lipitor  80.     Cardiomyopathy, unspecified type [facility]) Assessment & Plan: Echocardiogram 11/2022 - EF 50-55% with grade III DD, right and left atrium moderately dilated with mild MR and mild AR. Continue spironolactone , metoprolol , losartan .    Stage 3a chronic kidney disease (HCC) Assessment & Plan: Avoid antiinflammatories.  Stay hydrated.  Follow metabolic panel. Continue losartan . Check metabolic panel today.    CREST variant of scleroderma (HCC) Assessment & Plan: Followed by [facility]/rheumatology.    Gastroesophageal reflux disease with esophagitis without hemorrhage Assessment & Plan: No upper symptoms reported.  Continue protonix .    Hypercholesterolemia Assessment & Plan: Continue lipitor .  Low cholesterol diet and exercise.  Follow lipid panel and liver function tests.    Orders: -     CBC with Differential/Platelet -     Basic metabolic panel with GFR -     Hepatic function panel -     Lipid panel  Primary  hypertension Assessment & Plan: Continue metoprolol , losartan  and amlodipine .  Spironolactone  added during hospitalization. Follow pressures.  Follow metabolic panel.    Pulmonary hypertension, mild (HCC) Assessment & Plan: Continue f/u with pulmonary/cardiology.    Rheumatoid arthritis, involving unspecified site, unspecified whether rheumatoid factor present [facility]) Assessment & Plan: On MTX.  Has been followed by rheumatology. Wants me to prescribed methotrexate  for now given difficulty getting to appts. Notify rheumatology    Renal angiolipoma Assessment & Plan: Dr Cherylene Corrente (01/30/22) - stable.  F/u one year. Just had f/u renal ultrasound 02/2023 - no interval change.     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) Assessment & Plan: S/p amputation.  Has been followed by [facility].   Orders: -     AMB Referral [facility]  Generalized weakness Assessment & Plan: Needs home healt PT. Unable to stand on her own. They are using a sit/stand lift.   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 Otherwise unremarkable study.  Has seen Dr Cherylene Corrente.   Orders: -     AMB Referral [facility]  Thrombocytopenia (HCC) Assessment & Plan: Follow [facility]   Weakness of lower extremity, unspecified laterality Assessment & Plan: With weakness as outlined. Unable to stand without help. Family using a sit /stand lift. Home health has been ordered. F/u with home health for PT. Also see if other resources available to help with keeping her home.   Orders: -     AMB Referral [facility] Management     Dellar Fenton, MD

## 2023-10-06 NOTE — Assessment & Plan Note (Signed)
 No upper symptoms reported.  Continue protonix.

## 2023-10-06 NOTE — Assessment & Plan Note (Signed)
 Continue metoprolol, losartan and amlodipine.  Spironolactone added during hospitalization. Follow pressures.  Follow metabolic panel.

## 2023-10-06 NOTE — Assessment & Plan Note (Signed)
 Continue f/u with pulmonary/cardiology.

## 2023-10-06 NOTE — Assessment & Plan Note (Addendum)
 On MTX.  Has been followed by rheumatology. Wants me to prescribed methotrexate  for now given difficulty getting to appts. Notify rheumatology

## 2023-10-06 NOTE — Assessment & Plan Note (Signed)
 Cardiology (Dr Azucena Cecil). CAD s/p PCI to RV marginal branch 11/2022 (70% OM1, 80% second diagonal).  EF 50 to 55%.  Continue aspirin, Plavix, Lipitor 80.

## 2023-10-06 NOTE — Assessment & Plan Note (Signed)
 Followed by [facility]/rheumatology.

## 2023-10-07 ENCOUNTER — Ambulatory Visit: Payer: Self-pay | Admitting: Internal Medicine

## 2023-10-09 ENCOUNTER — Ambulatory Visit: Admitting: Internal Medicine

## 2023-10-09 ENCOUNTER — Ambulatory Visit: Admitting: Student

## 2023-10-11 ENCOUNTER — Encounter: Payer: Self-pay | Admitting: Internal Medicine

## 2023-10-11 DIAGNOSIS — R29898 Other symptoms and signs involving the musculoskeletal system: Secondary | ICD-10-CM | POA: Insufficient documentation

## 2023-10-11 NOTE — Assessment & Plan Note (Signed)
 S/p amputation.  Has been followed by [facility].

## 2023-10-11 NOTE — Assessment & Plan Note (Signed)
 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 Otherwise unremarkable study.  Has seen Dr Lonna Cobb.

## 2023-10-11 NOTE — Assessment & Plan Note (Signed)
 Off metformin . Continue low carb diet.  Discussed exercise as tolerated. Follow met b and A1c.

## 2023-10-11 NOTE — Assessment & Plan Note (Signed)
 Follow cbc.

## 2023-10-11 NOTE — Assessment & Plan Note (Signed)
 With weakness as outlined. Unable to stand without help. Family using a sit /stand lift. Home health has been ordered. F/u with home health for PT. Also see if other resources available to help with keeping her home.

## 2023-10-11 NOTE — Assessment & Plan Note (Signed)
 Continue lipitor  ?

## 2023-10-11 NOTE — Assessment & Plan Note (Signed)
 Needs home healt PT. Unable to stand on her own. They are using a sit/stand lift.

## 2023-10-13 ENCOUNTER — Other Ambulatory Visit: Payer: Self-pay | Admitting: Internal Medicine

## 2023-10-13 NOTE — Telephone Encounter (Unsigned)
 Copied from CRM [id number]. Topic: Clinical - Medication Refill >> Oct 13, 2023  4:27 PM Baldo Levan wrote: Medication: levothyroxine levothyroxine (SYNTHROID) 25 MCG tablet   metoprolol  succinate metoprolol  succinate (TOPROL -XL) 25 MG 24 hr tablet   Has the patient contacted their pharmacy? Yes (Agent: If no, request that the patient contact the pharmacy for the refill. If patient does not wish to contact the pharmacy document the reason why and proceed with request.) (Agent: If yes, when and what did the pharmacy advise?)  This is the patient's preferred pharmacy:  [facility] [address], [place] - [address] [place] [address] Phone: [phone] Fax: [phone]   Is this the correct pharmacy for this prescription? Yes If no, delete pharmacy and type the correct one.   Has the prescription been filled recently? No  Is the patient out of the medication? Yes  Has the patient been seen for an appointment in the last year OR does the patient have an upcoming appointment? Yes  Can we respond through MyChart? No  Agent: Please be advised that Rx refills may take up to 3 business days. We ask that you follow-up with your pharmacy.

## 2023-10-15 MED ORDER — METOPROLOL SUCCINATE ER 25 MG PO TB24: 25.0000 mg | ORAL_TABLET | Freq: Every day | ORAL | 1 refills | Status: DC

## 2023-10-15 NOTE — Telephone Encounter (Signed)
 I received a request for refill of synthroid. Please confirm if she is taking this medication.

## 2023-10-15 NOTE — Telephone Encounter (Signed)
 How would you like to distribute the levothyroxine?

## 2023-10-16 ENCOUNTER — Other Ambulatory Visit: Payer: Self-pay

## 2023-10-16 MED ORDER — METHOTREXATE SODIUM 2.5 MG PO TABS: 10.0000 mg | ORAL_TABLET | ORAL | 0 refills | Status: DC

## 2023-10-20 ENCOUNTER — Other Ambulatory Visit: Payer: Self-pay | Admitting: Internal Medicine

## 2023-10-20 NOTE — Progress Notes (Deleted)
 Cardiology Clinic Note   Date: 10/20/2023 ID: Nina Perez, DOB [birth year], MRN [id number]  Primary Cardiologist:  Constancia Delton, MD  Chief Complaint   Nina Perez is a 83 y.o. female who presents to the clinic today for ***  Patient Profile   Nina Perez is followed by Dr. Junnie Olives for the history outlined below.      Past medical history significant for: CAD. LHC 11/24/2022 (NSTEMI): RV branch 90%.  Proximal RCA 40%.  OM1 55%.  D1 60%.  Normal LV systolic function.  Mildly elevated LVEDP.  Trivial MR.  Culprit lesion probably marginal branch of RCA.  PCI with DES 2.75 x 22 mm to proximal RV marginal.  D2 is diseased but relatively small, no PCI recommended. Chronic HFpEF/pulmonary hypertension. Echo 07/20/2023: EF 55 to 60%.  No RWMA.  Moderate LVH.  Grade 1 DD.  Normal RV size/function.  Mild LAE.  Moderate MAC.  Mild aortic valve stenosis, mean gradient 10 mmHg.  Agitated saline contrast bubble study negative with no evidence of interatrial shunt. PAD. Abdominal aortogram with lower extremities 05/16/2019: Chocolate balloon angioplasty followed by drug-coated balloon angioplasty of mid left SFA. Lower extremity angiography 06/02/2019: Amputation fourth toe to MTP joint left foot. Lower extremity angiography 06/03/2019: PTA left anterior tibial. Lower extremity angiography 06/07/2019 (postop wound infection left foot): I&D abscess and hematoma. Hypertension. Hyperlipidemia. Lipid panel 03/02/2023: LDL 29, HDL 41, TG 125, total 95. CVA. 10-day ZIO 08/24/2023: HR 49 to 130 bpm, average 65 bpm.  Predominantly sinus rhythm.  First-degree AV block was present.  BBB/IVCD present.  1 run of SVT lasting 12 beats max rate 130 bpm.  Rare PACs/PVCs.  No A-fib/a flutter. Idiopathic pulmonary fibrosis. GERD. T2DM. CKD stage III. Lymphedema.  In summary, patient was previously followed by Dr. Katheryne Pane for PAD.  She underwent angioplasty to mid left SFA in early January 2021.  She was then  referred to vascular surgery and underwent left fourth toe amputation and PTA of left anterior tibial in late January 2021.  She developed a postop wound infection and underwent a I&D of the abscess and hematoma of the left foot.  She continues to follow with vascular surgery.  Patient presented to the ED in July 2024 with complaints of a 3-day history of chest tightness.  Troponin peaked at > 13,000.  She underwent PCI with DES to RV marginal.  Echo at that time demonstrated EF 50 to 55%, Grade III DD, normal RV function, mild RVH, moderate BAE, mild MR/AI, aortic valve sclerosis without stenosis.  She established care with Dr. Junnie Olives in August 2024 following her hospital visit she was doing well at that time and tolerating medications as well as cardiac rehab.  Patient was last seen in the office by Dr. Junnie Olives on 03/25/2023 for routine follow-up.  She reported shortness of breath after overexertion.  She fell the week prior secondary to her knees giving out.  She reported typically walking with a walker.  Dyspnea felt to be related to deconditioning and obesity.  She reported dependent edema and Lasix was changed to torsemide .  Patient was evaluated in the ED on 04/06/2023 for generalized weakness.  She reported being treated for cellulitis of the left knee and not getting up or moving much over the few days prior.  Head CT was negative for acute abnormalities.  Nuclear med pulmonary perfusion study was negative for PE.  MRI of bilateral knees showed a large subcutaneous hematoma in the left knee.  Orthopedic  surgeon did not feel this involved knee joint and suspected secondary cellulitis with recommendation for oral antibiotic.  She was found to have a UTI and was treated with Rocephin .  Echo demonstrated EF 50 to 55%, no RWMA, mild LVH, grade 1 DD, mildly reduced RV function, mild RVH, mild to moderate LAE, mild to moderate MR, mild AI, negative bubble study.  She was discharged to SNF for  rehab.  Patient presented to the ED on 07/18/2023 for sacral pain after falling in the shower.  She denied hitting her head but noted increased weakness.  Imaging was suspicious for potential nondisplaced right femoral neck fracture.  Follow-up CT of the right hip demonstrated no acute fracture or dislocation.  Head CT without acute abnormalities.  CTA head and neck showed no large vessel occlusion or proximal hemodynamically significant stenosis.  MRI of the brain demonstrated many punctate acute infarcts in the right greater than left frontal and parietal lobes in the cerebellum.  Echo with bubble study showed normal LV/RV function, Grade I DD, moderate MAC, mild aortic valve stenosis, bubble study negative for interatrial shunt.  Patient was discharged on 07/29/2023 to follow-up as an outpatient with neurology and cardiology.  10-day ZIO did not demonstrate A-fib/a flutter.     History of Present Illness    Today, patient ***  CAD S/p PCI with DES to proximal RV marginal July 2024.  Patient*** - Continue amlodipine , aspirin , Plavix , atorvastatin , Toprol , as needed SL NTG.  Chronic HFpEF/pulmonary hypertension CTA head and neck March 2025 showed incidental finding of enlarged main pulmonary artery suggestive of pulmonary arterial hypertension.  Echo March 2025 demonstrated normal LV/RV function, Grade I DD, mild LAE, moderate MAC, mild aortic valve stenosis, negative bubble study.  Patient*** Euvolemic and well compensated on exam. - Continue Toprol , lisinopril .  PAD Balloon angioplasty to mid left SFA January 2021.  Left fourth toe amputation January 2021.  Patient*** - Continue aspirin , Plavix , atorvastatin . - Continue to follow with vascular surgery.  Hypertension BP today*** - Continue amlodipine , lisinopril , Toprol .  Hyperlipidemia LDL 10 March 2023, at goal. - Continue atorvastatin .  CVA MRI of the brain March 2025 demonstrated many punctate acute infarcts in the right greater  than left frontal and parietal lobes in the cerebellum.  Echo March 2025 demonstrated no interatrial shunt with bubble study.  Patient*** - Continue aspirin , Plavix , atorvastatin . - Continue to follow with neurology.  ROS: All other systems reviewed and are otherwise negative except as noted in History of Present Illness.  EKGs/Labs Reviewed        10/06/2023: ALT 17; AST 19; BUN 26; Creatinine, Ser 1.02; Potassium 4.4; Sodium 139   10/06/2023: Hemoglobin 11.7; WBC 4.7   07/20/2023: TSH 4.172   07/18/2023: B Natriuretic Peptide 104.4  ***  Risk Assessment/Calculations    {Does this patient have ATRIAL FIBRILLATION?:[id number]} No BP recorded.  {Refresh Note OR Click here to enter BP  :1}***        Physical Exam    VS:  LMP 05/25/1988  , BMI There is no height or weight on file to calculate BMI.  GEN: Well nourished, well developed, in no acute distress. Neck: No JVD or carotid bruits. Cardiac: *** RRR. *** No murmur. No rubs or gallops.   Respiratory:  Respirations regular and unlabored. Clear to auscultation without rales, wheezing or rhonchi. GI: Soft, nontender, nondistended. Extremities: Radials/DP/PT 2+ and equal bilaterally. No clubbing or cyanosis. No edema ***  Skin: Warm and dry, no rash. Neuro: Strength intact.  Assessment & Plan   ***  Disposition: ***     {Are you ordering a CV Procedure (e.g. stress test, cath, DCCV, TEE, etc)?   Press F2        :[id number]}   Signed, Lonell Rives. Alen Matheson, DNP, NP-C

## 2023-10-20 NOTE — Telephone Encounter (Signed)
 Last Fill: Unk, Historical Provider  Last OV: 10/06/23 Next OV: 12/01/23  Routing to provider for review/authorization.

## 2023-10-20 NOTE — Telephone Encounter (Unsigned)
 Copied from CRM [id number]. Topic: Clinical - Medication Refill >> Oct 20, 2023  2:15 PM Nina Perez wrote: Medication: levothyroxine (SYNTHROID) 25 MCG tablet  Has the patient contacted their pharmacy? No (Agent: If no, request that the patient contact the pharmacy for the refill. If patient does not wish to contact the pharmacy document the reason why and proceed with request.) (Agent: If yes, when and what did the pharmacy advise?)  This is the patient's preferred pharmacy:  [facility] [address], [place] - [address] [place] [address] Phone: [phone] Fax: [phone]   Is this the correct pharmacy for this prescription? Yes If no, delete pharmacy and type the correct one.   Has the prescription been filled recently? No  Is the patient out of the medication? Yes, took last pill today.  Has the patient been seen for an appointment in the last year OR does the patient have an upcoming appointment? Yes  Can we respond through MyChart? Yes  Agent: Please be advised that Rx refills may take up to 3 business days. We ask that you follow-up with your pharmacy.

## 2023-10-21 MED ORDER — LEVOTHYROXINE SODIUM 25 MCG PO TABS: 25.0000 ug | ORAL_TABLET | Freq: Every day | ORAL | 0 refills | Status: DC

## 2023-10-21 NOTE — Telephone Encounter (Unsigned)
 Copied from CRM [id number]. Topic: Clinical - Medication Question >> Oct 21, 2023 12:35 PM Aisha D wrote: Reason for CRM: Pt's husband is calling to check the status of her medication refill request for the levothyroxine (SYNTHROID) 25 MCG tablet. I informed him that the request is currently pending. Husband would like for the medication to be approved today if possible and receive a callback with an update on the request.

## 2023-10-21 NOTE — Telephone Encounter (Signed)
 I refilled the medication x 1. Will need a f/u tsh checked. We will need to correlate with homehealth to see if can draw with other labs.

## 2023-10-22 ENCOUNTER — Ambulatory Visit: Attending: Student | Admitting: Student

## 2023-10-23 ENCOUNTER — Telehealth: Payer: Self-pay

## 2023-10-23 NOTE — Progress Notes (Signed)
 Complex Care Management Note  Care Guide Note 10/23/2023 Name: Nina Perez MRN: [id number] DOB: [birth year]  Nina Perez is a 83 y.o. year old female who sees Dellar Fenton, MD for primary care. I reached out to Cortez Dines by phone today to offer complex care management services.  Ms. Brandt was given information about Complex Care Management services today including:   The Complex Care Management services include support from the care team which includes your Nurse Care Manager, Clinical Social Worker, or Pharmacist.  The Complex Care Management team is here to help remove barriers to the health concerns and goals most important to you. Complex Care Management services are voluntary, and the patient may decline or stop services at any time by request to their care team member.   Complex Care Management Consent Status: Patient agreed to services and verbal consent obtained.   Follow up plan:  Telephone appointment with complex care management team member scheduled for:  10/27/23 at 11:00 a.m.   Encounter Outcome:  Patient Scheduled  [facility]  [facility] Care Management Assistant Direct Dial: [phone]  Fax: [phone]

## 2023-10-27 ENCOUNTER — Other Ambulatory Visit: Payer: Self-pay

## 2023-10-27 ENCOUNTER — Telehealth

## 2023-10-27 NOTE — Patient Instructions (Signed)
 Visit Information  Thank you for taking time to visit with me today. Please don't hesitate to contact me if I can be of assistance to you.  Our next appointment is no further scheduled appointments.   Please call the care guide team at [phone] if you need to cancel or reschedule your appointment.     Please call the Suicide and Crisis Lifeline: 988 call the [place]  National Suicide Prevention Lifeline: 931-572-2255 or TTY: 276-048-6464 TTY (724) 689-1142) to talk to a trained counselor call 1-800-273-TALK (toll free, 24 hour hotline) call 911 if you are experiencing a Mental Health or Behavioral Health Crisis or need someone to talk to.  Patient verbalizes understanding of instructions and care plan provided today and agrees to view in MyChart. Active MyChart status and patient understanding of how to access instructions and care plan via MyChart confirmed with patient.     Haven Lion, BSW [place]  [facility] Social Worker, [facility] [phone]

## 2023-10-27 NOTE — Patient Outreach (Signed)
 Complex Care Management   Visit Note  10/27/2023  Name:  Nina Perez MRN: [id number] DOB: 09/23/1940  Situation: Referral received for Complex Care Management related to SDOH Barriers:    I obtained verbal consent from Patient.  Visit completed with patient  on the phone  Background:   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HCC)    a.) 2/2 scleroderma Dx   LBBB (left bundle branch block)    Long term current use of antithrombotics/antiplatelets    a.) ASA + clopidogrel    Long term current use of immunosuppressive drug    a.) MTX for RA and scleroderma Dx   OSA on CPAP    Osteoarthritis    a.) knees, spine   PAD (peripheral artery disease) (HCC)    Phlebitis    a.) x 2 (with pregnancy)   Pulmonary fibrosis (HCC)    a.) mild   Pulmonary hypertension (HCC) 02/24/2012   a.) TTE 02/24/2012: EF 55%, RVSP 33; b.) TTE 10/12/2014: EF >55%, RVSP 36.1; c.) TTE 09/02/2017: EF 55%, RVSP 45.5   Reflux esophagitis    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 b.) renal US  05/23/2020: solid LEFT renal mass measuring 1.4 x 1.5 x 1.6. 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 BSW did not identify any barriers at this time. Patient reports that there are no current concerns impacting access to housing, transportation, or food.    Recommendation:   No recommendations at this time.  Follow Up Plan:   Patient has met all care management goals. Care Management case will be closed. Patient has been provided contact information should new needs arise.   Haven Lion, BSW [place]  [facility] Social Worker, [facility] [phone]

## 2023-10-30 ENCOUNTER — Other Ambulatory Visit: Payer: Self-pay | Admitting: Internal Medicine

## 2023-11-01 NOTE — Telephone Encounter (Signed)
 Lipitor  was changed by Dr Von in hospital to 20mg  in March. Lipid was checked in May but there was no mention found on how she should be taking this medication.  Please advise.  Take 0.5 tablets (40 mg total) by mouth daily., Starting Wed 07/29/2023, No Print  Order Details Ordered on: 07/29/23  Authorizing provider: Von Bellis, MD

## 2023-11-01 NOTE — Telephone Encounter (Signed)
 Received request for lipitor  refill. Need to clarify what dose of lipitor  she is taking and then ok to refill.

## 2023-11-02 ENCOUNTER — Other Ambulatory Visit: Payer: Self-pay | Admitting: Internal Medicine

## 2023-11-02 MED ORDER — LISINOPRIL 10 MG PO TABS: 10.0000 mg | ORAL_TABLET | Freq: Every day | ORAL | 0 refills | Status: DC

## 2023-11-02 NOTE — Telephone Encounter (Signed)
 Copied from CRM [id number]. Topic: Clinical - Medication Refill >> Nov 02, 2023  8:36 AM Essie A wrote: Medication: lisinopril  (ZESTRIL ) 10 MG tablet. Patient's spouse says the bottle has 20 mg.  Has the patient contacted their pharmacy? Yes (Agent: If no, request that the patient contact the pharmacy for the refill. If patient does not wish to contact the pharmacy document the reason why and proceed with request.) (Agent: If yes, when and what did the pharmacy advise?)  This is the patient's preferred pharmacy:  [facility] [address], [place] - [address] [place] [address] Phone: [phone] Fax: [phone]  Is this the correct pharmacy for this prescription? Yes If no, delete pharmacy and type the correct one.   Has the prescription been filled recently? Yes  Is the patient out of the medication? No, have two more days left  Has the patient been seen for an appointment in the last year OR does the patient have an upcoming appointment? Yes  Can we respond through MyChart? No  Agent: Please be advised that Rx refills may take up to 3 business days. We ask that you follow-up with your pharmacy.

## 2023-11-03 MED ORDER — ATORVASTATIN CALCIUM 40 MG PO TABS: 40.0000 mg | ORAL_TABLET | Freq: Every day | ORAL | 1 refills | Status: DC

## 2023-11-03 NOTE — Telephone Encounter (Signed)
 Reviewed. Pt taking atorvastatin  40mg  q day. I have sent in the 40mg  dose of atorvastatin . She can take one per day. (Instead of having to cut the 80mg  in 1/2). Please notify her husband of above.

## 2023-11-17 NOTE — Progress Notes (Unsigned)
 Cardiology Clinic Note   Date: 11/17/2023 ID: AFRA TRICARICO, DOB October 27, 1940, MRN [id number]  Primary Cardiologist:  Redell Cave, MD  Chief Complaint   Nina Perez is a 83 y.o. female who presents to the clinic today for ***  Patient Profile   MARIN WISNER is followed by Dr. Cave for the history outlined below.      Past medical history significant for: CAD. LHC 11/24/2022 (NSTEMI): RV branch 90%.  Proximal RCA 40%.  OM1 70%.  D1 60%.  D2 80%.  PCI with DES 2.75 x 22 mm to RV marginal.  No PCI recommended for D2 secondary to small in size. Chronic diastolic heart failure/pulmonary hypertension. CTA head and neck 07/19/2023: Enlarged main pulmonary artery suggestive of pulmonary arterial hypertension. Echo 07/20/2023: EF 55 to 60%.  No RWMA.  Grade I DD.  Normal RV size/function.  Mild LAE.  Moderate MAC.  Mild aortic valve stenosis mean gradient 10 mmHg. PAD. Abdominal aortogram with lower extremities 05/16/2019: Chocolate balloon angioplasty followed by drug-coated balloon angioplasty mid left SFA. Amputation fourth toe to MTP joint left foot 06/02/2019. PTA left anterior tibial 06/03/2019. Lower extremity angiography 05/21/2021: No evidence of renal artery stenosis.  Widely patent right common femoral profundofemoral, superficial femoral and popliteal arteries.  Trifurcation demonstrates severe disease with occlusion of the anterior tibial just after origin.  Anterior tibial remains occluded down to the level of the ankle.  Posterior tibial is also occluded from its origin down to the foot and there is nonvisualization of the plantar arteries.  The tibioperoneal trunk and peroneal are widely patent with mild atherosclerotic changes.  There is large collateral from the distal peroneal that reconstitutes the dorsalis pedis and there is filling of the pedal arch.  Common femoral origins of the profunda femoris and SFA are widely patent. Hypertension. Hyperlipidemia. Lipid panel  10/06/2023: LDL 38, HDL 39, TG 104, total 98. Idiopathic pulmonary fibrosis. GERD. T2DM. CKD stage IIIa. Gout. CVA. 10-day ZIO 08/24/2023: HR 49 to 130 bpm, average 65 bpm.  Predominantly sinus rhythm.  First-degree AV block present.  BBB/IVCD present.  1 run of SVT lasting 12 beats max rate 130 bpm.  Rare ectopy.  No A-fib/a-flutter.  In summary, patient was first seen in our practice by Dr. Court on 04/12/2019 for PV evaluation secondary to critical limb ischemia.  She underwent angioplasty to mid left SFA in early January 2021 performed by Dr. Court.  In late January 2021 she underwent amputation of left fourth toe.  Lower extremity angiography performed in January 2023 as detailed above.  She is followed by vascular surgery.  Patient was previously followed by Dr. Hester for dyspnea.  In July 2024 she was admitted for NSTEMI and underwent PCI with DES to RV marginal.  Patient established care with Dr. Cave on 12/12/2022.  Patient was last seen in the office by Dr. Cave on 03/25/2019 for with complaints of DOE.  Her symptoms were felt to be secondary to deconditioning.  Patient presented to the ED on 07/18/2023 after mechanical fall in the shower the morning prior.  She complained of sacral pain and bilateral leg weakness.  X-ray of the hips demonstrated potential nondisplaced right femoral neck fracture, no fracture of the pelvis, sacrum or coccyx.  CT of the lumbar and cervical spines without fracture.  Head CT without acute intracranial abnormality.  MRI of the brain showed many punctate acute infarcts in the right greater than left frontal and parietal lobes in the cerebellum, given  involvement of multiple vascular territories consider embolic etiology.  Echo demonstrated normal LV/RV function as detailed above.  Patient was discharged on 07/29/2023.  She underwent 10-day ZIO monitoring which did not show A-fib/a-flutter.  It was recommended she follow-up with EP for consideration of  implantable loop recorder.     History of Present Illness    Today, patient ***  CAD S/p PCI with DES to RV marginal July 2024 in the setting of NSTEMI.  Patient*** - Continue aspirin , Plavix , Toprol , atorvastatin , as needed SL NTG.  Chronic diastolic heart failure/pulmonary hypertension/aortic valve stenosis CTA head and neck March 2025 demonstrated enlarged main pulmonary artery suggestive of pulmonary arterial hypertension.  Echo showed normal LV/RV function, Grade I DD, mild LAE, moderate MAC, mild aortic stenosis.  Patient***Euvolemic and well compensated on exam.  - Continue lisinopril , Toprol .  Hypertension BP today*** - Continue amlodipine , lisinopril , Toprol .  Hyperlipidemia LDL 38 May 2025, at goal. - Continue atorvastatin .  CVA 10-day ZIO April 2025 without A-fib/a-flutter.  Patient*** - Refer to EP for implantable loop recorder***  ROS: All other systems reviewed and are otherwise negative except as noted in History of Present Illness.  EKGs/Labs Reviewed        10/06/2023: ALT 17; AST 19; BUN 26; Creatinine, Ser 1.02; Potassium 4.4; Sodium 139   10/06/2023: Hemoglobin 11.7; WBC 4.7   07/20/2023: TSH 4.172   07/18/2023: B Natriuretic Peptide 104.4  ***  Risk Assessment/Calculations    {Does this patient have ATRIAL FIBRILLATION?:[id number]} No BP recorded.  {Refresh Note OR Click here to enter BP  :1}***        Physical Exam    VS:  LMP 05/25/1988  , BMI There is no height or weight on file to calculate BMI.  GEN: Well nourished, well developed, in no acute distress. Neck: No JVD or carotid bruits. Cardiac: *** RRR. *** No murmur. No rubs or gallops.   Respiratory:  Respirations regular and unlabored. Clear to auscultation without rales, wheezing or rhonchi. GI: Soft, nontender, nondistended. Extremities: Radials/DP/PT 2+ and equal bilaterally. No clubbing or cyanosis. No edema ***  Skin: Warm and dry, no rash. Neuro: Strength intact.  Assessment &  Plan   ***  Disposition: ***     {Are you ordering a CV Procedure (e.g. stress test, cath, DCCV, TEE, etc)?   Press F2        :[id number]}   Signed, Barnie HERO. Nickolaus Bordelon, DNP, NP-C

## 2023-11-19 ENCOUNTER — Encounter: Payer: Self-pay | Admitting: Student

## 2023-11-19 ENCOUNTER — Ambulatory Visit: Attending: Student | Admitting: Student

## 2023-11-19 VITALS — BP 127/69 | HR 61 | Ht 66.0 in | Wt 200.0 lb

## 2023-11-19 DIAGNOSIS — I272 Pulmonary hypertension, unspecified: Secondary | ICD-10-CM | POA: Diagnosis present

## 2023-11-19 DIAGNOSIS — I5032 Chronic diastolic (congestive) heart failure: Secondary | ICD-10-CM | POA: Insufficient documentation

## 2023-11-19 DIAGNOSIS — I2510 Atherosclerotic heart disease of native coronary artery without angina pectoris: Secondary | ICD-10-CM | POA: Insufficient documentation

## 2023-11-19 DIAGNOSIS — I350 Nonrheumatic aortic (valve) stenosis: Secondary | ICD-10-CM | POA: Diagnosis present

## 2023-11-19 DIAGNOSIS — I10 Essential (primary) hypertension: Secondary | ICD-10-CM | POA: Insufficient documentation

## 2023-11-19 NOTE — Patient Instructions (Signed)
 Medication Instructions:  Your physician recommends that you continue on your current medications as directed. Please refer to the Current Medication list given to you today.   *If you need a refill on your cardiac medications before your next appointment, please call your pharmacy*  Lab Work: None ordered at this time  If you have labs (blood work) drawn today and your tests are completely normal, you will receive your results only by: MyChart Message (if you have MyChart) OR A paper copy in the mail If you have any lab test that is abnormal or we need to change your treatment, we will call you to review the results.  Testing/Procedures: None ordered at this time   Follow-Up: At [facility], you and your health needs are our priority.  As part of our continuing mission to provide you with exceptional heart care, our providers are all part of one team.  This team includes your primary Cardiologist (physician) and Advanced Practice Providers or APPs (Physician Assistants and Nurse Practitioners) who all work together to provide you with the care you need, when you need it.  Your next appointment:   6 month(s)  Provider:   Redell Cave, MD or Barnie Hila, NP    We recommend signing up for the patient portal called MyChart.  Sign up information is provided on this After Visit Summary.  MyChart is used to connect with patients for Virtual Visits (Telemedicine).  Patients are able to view lab/test results, encounter notes, upcoming appointments, etc.  Non-urgent messages can be sent to your provider as well.   To learn more about what you can do with MyChart, go to [email or web address].

## 2023-11-20 DIAGNOSIS — M9971 Connective tissue and disc stenosis of intervertebral foramina of cervical region: Secondary | ICD-10-CM

## 2023-11-20 DIAGNOSIS — E1151 Type 2 diabetes mellitus with diabetic peripheral angiopathy without gangrene: Secondary | ICD-10-CM | POA: Diagnosis not present

## 2023-11-20 DIAGNOSIS — N1831 Chronic kidney disease, stage 3a: Secondary | ICD-10-CM

## 2023-11-20 DIAGNOSIS — I130 Hypertensive heart and chronic kidney disease with heart failure and stage 1 through stage 4 chronic kidney disease, or unspecified chronic kidney disease: Secondary | ICD-10-CM | POA: Diagnosis not present

## 2023-11-20 DIAGNOSIS — I5032 Chronic diastolic (congestive) heart failure: Secondary | ICD-10-CM | POA: Diagnosis not present

## 2023-11-20 DIAGNOSIS — M109 Gout, unspecified: Secondary | ICD-10-CM

## 2023-11-20 DIAGNOSIS — M069 Rheumatoid arthritis, unspecified: Secondary | ICD-10-CM

## 2023-11-20 DIAGNOSIS — M341 CR(E)ST syndrome: Secondary | ICD-10-CM

## 2023-11-20 DIAGNOSIS — I69354 Hemiplegia and hemiparesis following cerebral infarction affecting left non-dominant side: Secondary | ICD-10-CM | POA: Diagnosis not present

## 2023-11-20 DIAGNOSIS — E1122 Type 2 diabetes mellitus with diabetic chronic kidney disease: Secondary | ICD-10-CM

## 2023-11-20 DIAGNOSIS — M4802 Spinal stenosis, cervical region: Secondary | ICD-10-CM

## 2023-11-20 DIAGNOSIS — J84112 Idiopathic pulmonary fibrosis: Secondary | ICD-10-CM

## 2023-11-22 ENCOUNTER — Other Ambulatory Visit: Payer: Self-pay | Admitting: Internal Medicine

## 2023-11-25 ENCOUNTER — Telehealth: Payer: Self-pay | Admitting: Internal Medicine

## 2023-11-25 NOTE — Telephone Encounter (Signed)
 Called pt husband and he stated that they has to change [place] appointment to Sept due to transportation not being able to get her here until 11:30. He wanted to know if the day in Sept would be ok, or does Dr. Glendia need to see her sooner?

## 2023-11-25 NOTE — Telephone Encounter (Unsigned)
 Copied from CRM [id number]. Topic: General - Other >> Nov 25, 2023  3:42 PM Gennette ORN wrote: Reason for CRM: Patient husband called to change appointment to later date due to no transportation. He wants nurse to call back.

## 2023-11-26 NOTE — Telephone Encounter (Signed)
 I can see her before September and can work around a time for them. She just saw cardiology 11/19/23. Confirm with her husband how she is doing and I can work around a time.  If he does not need feel she needs to come in now, we can schedule her for a follow up appt in the next several weeks to help space out appts for them.  I can see her at 12:00 on 12/23/23.  (If needs (or if they prefer) to be seen earlier, just let me know).

## 2023-11-26 NOTE — Telephone Encounter (Signed)
 Pt aware. She is doing ok. I have moved up appt to 8/13.

## 2023-12-01 ENCOUNTER — Ambulatory Visit: Admitting: Internal Medicine

## 2023-12-03 ENCOUNTER — Telehealth: Payer: Self-pay | Admitting: Internal Medicine

## 2023-12-03 NOTE — Telephone Encounter (Signed)
 Signed.  Placed in box.

## 2023-12-03 NOTE — Telephone Encounter (Signed)
 Faxed

## 2023-12-03 NOTE — Telephone Encounter (Signed)
 Copied from CRM [id number]. Topic: General - Other >> Dec 03, 2023  9:32 AM Franky GRADE wrote: Reason for CRM: Jon from [facility] is calling to follow up on a physician order that was faxed 11/18/2023 and again on 12/02/2023 and is being faxed to the office now. Best call back number is [phone].

## 2023-12-03 NOTE — Telephone Encounter (Signed)
 Order printed for signature.

## 2023-12-09 ENCOUNTER — Telehealth: Payer: Self-pay | Admitting: Emergency Medicine

## 2023-12-09 NOTE — Telephone Encounter (Signed)
 Called and spoke with the patient regarding Barnie Hila, NP's recommendation in concurrence with Dr. Darliss that the patient continue with taking both the Aspirin  and Plavix .  Patient verbalized agreement with therapy plan, and verbalized understanding with all questions and concerns addressed at this time.     Ms. Cisney,    It was nice meeting you and your family yesterday. I spoke with Dr. Darliss about Plavix  and aspirin . He agrees with you continuing on both medications given your recent stroke.   If you have any questions, please let me know.    Benjaman Barnie, NP

## 2023-12-16 ENCOUNTER — Other Ambulatory Visit: Payer: Self-pay | Admitting: Internal Medicine

## 2023-12-23 ENCOUNTER — Ambulatory Visit: Admitting: Internal Medicine

## 2023-12-29 ENCOUNTER — Telehealth: Payer: Self-pay

## 2023-12-29 DIAGNOSIS — J9601 Acute respiratory failure with hypoxia: Secondary | ICD-10-CM

## 2023-12-29 NOTE — Telephone Encounter (Signed)
 Copied from CRM [id number]. Topic: General - Other >> Dec 29, 2023 11:20 AM Burnard DEL wrote: Reason for CRM: [facility] called In to see if they could receive an overnight oxsemetry test  order to see if patient needs her oxygen at night time,before considering if [facility] needs to pick up her oxygen. Patient doesn't use the oxygen. Patient is bedbound.They want to check to see if she needs it at night time.

## 2023-12-29 NOTE — Telephone Encounter (Signed)
 I am ok with checking. Please make family aware of concerns.

## 2023-12-30 NOTE — Telephone Encounter (Signed)
 Patient is agreeable to overnight oximetry. Will call [facility]

## 2023-12-31 NOTE — Telephone Encounter (Signed)
 Overnight oximetry test printed for signature.

## 2023-12-31 NOTE — Addendum Note (Signed)
 Addended by: LEARTA PORTO D on: 12/31/2023 09:24 AM   Modules accepted: Orders

## 2024-01-04 NOTE — Telephone Encounter (Signed)
 Signed and placed in box.

## 2024-01-04 NOTE — Telephone Encounter (Signed)
 Faxed to [facility].

## 2024-01-07 ENCOUNTER — Other Ambulatory Visit: Payer: Self-pay | Admitting: Internal Medicine

## 2024-01-08 ENCOUNTER — Ambulatory Visit (INDEPENDENT_AMBULATORY_CARE_PROVIDER_SITE_OTHER): Admitting: Internal Medicine

## 2024-01-08 VITALS — BP 122/68 | HR 66 | Resp 16 | Ht 66.0 in | Wt 200.0 lb

## 2024-01-08 DIAGNOSIS — I10 Essential (primary) hypertension: Secondary | ICD-10-CM

## 2024-01-08 DIAGNOSIS — E039 Hypothyroidism, unspecified: Secondary | ICD-10-CM

## 2024-01-08 DIAGNOSIS — Z89422 Acquired absence of other left toe(s): Secondary | ICD-10-CM

## 2024-01-08 DIAGNOSIS — M7989 Other specified soft tissue disorders: Secondary | ICD-10-CM

## 2024-01-08 DIAGNOSIS — E1169 Type 2 diabetes mellitus with other specified complication: Secondary | ICD-10-CM | POA: Diagnosis not present

## 2024-01-08 DIAGNOSIS — I739 Peripheral vascular disease, unspecified: Secondary | ICD-10-CM

## 2024-01-08 DIAGNOSIS — I272 Pulmonary hypertension, unspecified: Secondary | ICD-10-CM

## 2024-01-08 DIAGNOSIS — I429 Cardiomyopathy, unspecified: Secondary | ICD-10-CM

## 2024-01-08 DIAGNOSIS — E7800 Pure hypercholesterolemia, unspecified: Secondary | ICD-10-CM | POA: Diagnosis not present

## 2024-01-08 DIAGNOSIS — I2510 Atherosclerotic heart disease of native coronary artery without angina pectoris: Secondary | ICD-10-CM

## 2024-01-08 DIAGNOSIS — I70221 Atherosclerosis of native arteries of extremities with rest pain, right leg: Secondary | ICD-10-CM

## 2024-01-08 DIAGNOSIS — D631 Anemia in chronic kidney disease: Secondary | ICD-10-CM | POA: Diagnosis not present

## 2024-01-08 DIAGNOSIS — M069 Rheumatoid arthritis, unspecified: Secondary | ICD-10-CM

## 2024-01-08 DIAGNOSIS — N189 Chronic kidney disease, unspecified: Secondary | ICD-10-CM | POA: Diagnosis not present

## 2024-01-08 DIAGNOSIS — I700 Atherosclerosis of aorta: Secondary | ICD-10-CM

## 2024-01-08 DIAGNOSIS — G4734 Idiopathic sleep related nonobstructive alveolar hypoventilation: Secondary | ICD-10-CM

## 2024-01-08 DIAGNOSIS — D1771 Benign lipomatous neoplasm of kidney: Secondary | ICD-10-CM

## 2024-01-08 DIAGNOSIS — D696 Thrombocytopenia, unspecified: Secondary | ICD-10-CM

## 2024-01-08 DIAGNOSIS — M341 CR(E)ST syndrome: Secondary | ICD-10-CM

## 2024-01-08 DIAGNOSIS — N1831 Chronic kidney disease, stage 3a: Secondary | ICD-10-CM

## 2024-01-08 LAB — BASIC METABOLIC PANEL WITH GFR
BUN: 16 mg/dL (ref 6–23)
CO2: 30 meq/L (ref 19–32)
Calcium: 9.5 mg/dL (ref 8.4–10.5)
Chloride: 102 meq/L (ref 96–112)
Creatinine, Ser: 0.79 mg/dL (ref 0.40–1.20)
GFR: 69.56 mL/min (ref >60.00)
Glucose, Bld: 100 mg/dL — ABNORMAL HIGH (ref 70–99)
Potassium: 4.1 meq/L (ref 3.5–5.1)
Sodium: 138 meq/L (ref 135–145)

## 2024-01-08 LAB — CBC WITH DIFFERENTIAL/PLATELET
Basophils Absolute: 0 K/uL (ref 0.0–0.1)
Basophils Relative: 0.9 % (ref 0.0–3.0)
Eosinophils Absolute: 0.1 K/uL (ref 0.0–0.7)
Eosinophils Relative: 1.3 % (ref 0.0–5.0)
HCT: 38.7 % (ref 36.0–46.0)
Hemoglobin: 12.2 g/dL (ref 12.0–15.0)
Lymphocytes Relative: 19 % (ref 12.0–46.0)
Lymphs Abs: 0.8 K/uL (ref 0.7–4.0)
MCHC: 31.5 g/dL (ref 30.0–36.0)
MCV: 92.7 fl (ref 78.0–100.0)
Monocytes Absolute: 0.4 K/uL (ref 0.1–1.0)
Monocytes Relative: 10.9 % (ref 3.0–12.0)
Neutro Abs: 2.7 K/uL (ref 1.4–7.7)
Neutrophils Relative %: 67.9 % (ref 43.0–77.0)
Platelets: 144 K/uL — ABNORMAL LOW (ref 150.0–400.0)
RBC: 4.18 Mil/uL (ref 3.87–5.11)
RDW: 18.7 % — ABNORMAL HIGH (ref 11.5–15.5)
WBC: 4 K/uL (ref 4.0–10.5)

## 2024-01-08 LAB — IBC + FERRITIN
Ferritin: 19.7 ng/mL (ref 10.0–291.0)
Iron: 45 ug/dL (ref 42–145)
Saturation Ratios: 13.1 % — ABNORMAL LOW (ref 20.0–50.0)
TIBC: 343 ug/dL (ref 250.0–450.0)
Transferrin: 245 mg/dL (ref 212.0–360.0)

## 2024-01-08 LAB — HEPATIC FUNCTION PANEL
ALT: 11 U/L (ref 0–35)
AST: 16 U/L (ref 0–37)
Albumin: 3.3 g/dL — ABNORMAL LOW (ref 3.5–5.2)
Alkaline Phosphatase: 96 U/L (ref 39–117)
Bilirubin, Direct: 0.1 mg/dL (ref 0.0–0.3)
Total Bilirubin: 0.5 mg/dL (ref 0.2–1.2)
Total Protein: 6.5 g/dL (ref 6.0–8.3)

## 2024-01-08 LAB — LIPID PANEL
Cholesterol: 84 mg/dL (ref 0–200)
HDL: 30.8 mg/dL — ABNORMAL LOW (ref >39.00)
LDL Cholesterol: 35 mg/dL (ref 0–99)
NonHDL: 53.38
Total CHOL/HDL Ratio: 3
Triglycerides: 94 mg/dL (ref 0.0–149.0)
VLDL: 18.8 mg/dL (ref 0.0–40.0)

## 2024-01-08 LAB — VITAMIN B12: Vitamin B-12: 334 pg/mL (ref 211–911)

## 2024-01-08 LAB — HEMOGLOBIN A1C: Hgb A1c MFr Bld: 6.1 % (ref 4.6–6.5)

## 2024-01-08 LAB — TSH: TSH: 3.08 u[IU]/mL (ref 0.35–5.50)

## 2024-01-08 MED ORDER — MUPIROCIN 2 % EX OINT: 1.0000 | TOPICAL_OINTMENT | Freq: Two times a day (BID) | CUTANEOUS | 0 refills | Status: AC

## 2024-01-08 MED ORDER — AMLODIPINE BESYLATE 5 MG PO TABS: 5.0000 mg | ORAL_TABLET | Freq: Every day | ORAL | 1 refills | Status: AC

## 2024-01-08 MED ORDER — METHOTREXATE SODIUM 2.5 MG PO TABS: 10.0000 mg | ORAL_TABLET | ORAL | 0 refills | Status: DC

## 2024-01-08 MED ORDER — PANTOPRAZOLE SODIUM 40 MG PO TBEC: 40.0000 mg | DELAYED_RELEASE_TABLET | Freq: Every day | ORAL | 3 refills | Status: AC

## 2024-01-08 MED ORDER — CLOPIDOGREL BISULFATE 75 MG PO TABS: 75.0000 mg | ORAL_TABLET | Freq: Every day | ORAL | 1 refills | Status: AC

## 2024-01-08 MED ORDER — METOPROLOL SUCCINATE ER 25 MG PO TB24: 25.0000 mg | ORAL_TABLET | Freq: Every day | ORAL | 1 refills | Status: AC

## 2024-01-08 MED ORDER — ATORVASTATIN CALCIUM 40 MG PO TABS: 40.0000 mg | ORAL_TABLET | Freq: Every day | ORAL | 1 refills | Status: AC

## 2024-01-08 MED ORDER — MAGNESIUM OXIDE 400 MG PO TABS: 400.0000 mg | ORAL_TABLET | Freq: Two times a day (BID) | ORAL | 1 refills | Status: AC

## 2024-01-08 MED ORDER — ALLOPURINOL 100 MG PO TABS: 100.0000 mg | ORAL_TABLET | Freq: Every day | ORAL | 1 refills | Status: AC

## 2024-01-08 NOTE — Progress Notes (Signed)
 Subjective:    Patient ID: Nina Perez, female    DOB: [birth year], 83 y.o.   MRN: [id number]  Patient here for  Chief Complaint  Patient presents with   Medical Management of Chronic Issues    HPI Here for a scheduled follow up - follow up regarding CAD, diabetes, hypercholesterolemia and hypertension. Discharged from [facility] 09/23/23. 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 Also had multiple falls prior to admission. Was on aspirin  and plavix . Residual left arm and leg weakness. MRI c-spine - mild spinal canal stenosis at C4-5 and C5-6, severe left C4-5 and C5-6 neural foraminal stenosis. CT angio head and neck - no large vessel occlusion, enlarged main pulmonary arteries, aortic atherosclerosis. MRI brain - many punctate acute infarcts in the right > left frontal and parietal lobes and the cerebellum. Saw neurology - 09/02/23 - recommended to continue aspirin  and plavix . Hold on medication for memory. Recommended f/u with cardiology - f/u cardiac monitor revealed normal rhythm with average heart rate 65 bpm, no afib or aflutter, one run SVT. Recommended EP referral to consider ILR. Per review, cardiac monitor revealed normal rhythm with average heart rate 65, no afib or aflutter, one run of fast heart rhythm and rare irregular beats. Had f/u with [facility] 08/19/23 - f/u regarding c-spine arthritis, etc. Elected to follow. Saw cardiology 11/19/23 - recommended to continue aspirin , plavix , toprol  and atorvastatin . She is accompanied by her husband. History obtained from both of them. She mainly stays in her wheelchair or sitting. Uses a lift to help with position changes, etc. Unable to walk or stand without assistance. Breathing overall stable. She did have a recent overnight pulse ox and needs nocturnal oxygen. Had bottom teeth removed. Her bottom dentures do not fit well. Has to watch what she eats.    Past Medical History:  Diagnosis Date   Adenomatous colon polyp    Aortic atherosclerosis (HCC)     Barrett's esophagus    Cardiac murmur    CKD (chronic kidney disease), stage III (HCC)    Dyspnea on exertion    GERD (gastroesophageal reflux disease)    Gout    Hiatal hernia    History of 2019 novel coronavirus disease (COVID-19) 08/2020   Hypercholesterolemia    Hypertension    ILD (interstitial lung disease) (HCC)    a.) 2/2 scleroderma Dx   LBBB (left bundle branch block)    Long term current use of antithrombotics/antiplatelets    a.) ASA + clopidogrel    Long term current use of immunosuppressive drug    a.) MTX for RA and scleroderma Dx   OSA on CPAP    Osteoarthritis    a.) knees, spine   PAD (peripheral artery disease) (HCC)    Phlebitis    a.) x 2 (with pregnancy)   Pulmonary fibrosis (HCC)    a.) mild   Pulmonary hypertension (HCC) 02/24/2012   a.) TTE 02/24/2012: EF 55%, RVSP 33; b.) TTE 10/12/2014: EF >55%, RVSP 36.1; c.) TTE 09/02/2017: EF 55%, RVSP 45.5   Reflux esophagitis    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 b.) renal US  05/23/2020: solid LEFT renal mass measuring 1.4 x 1.5 x 1.6. c.) MRI abd 12/04/2020: approx 1.2 cm fat signal lesion c/w benign angiolipoma   Rheumatoid arthritis    a.) positive  RF, FANA, RNP; negative CCP Ab and anti DNA; neg anti-SCL 70   Scleroderma (  HCC)    a.) with (+) CREST syndrome --> raynaud's, sclerodactyly, telangiectasias   Skin cancer 2018   Supplemental oxygen dependent    a.) 2L/Makena PRN for DOE related to mild pulmonary fibrosis and pulmonary HTN   T2DM (type 2 diabetes mellitus) (HCC)    Past Surgical History:  Procedure Laterality Date   ABDOMINAL AORTOGRAM W/LOWER EXTREMITY Left 05/16/2019   Procedure: ABDOMINAL AORTOGRAM W/LOWER EXTREMITY;  Surgeon: Court Dorn PARAS, MD;  [facility];  Service: Cardiovascular;  Laterality: Left;   AMPUTATION TOE Left 06/02/2019   Procedure: AMPUTATION TOE;  Surgeon:  Lilli Cough, DPM;  Location: [facility];  Service: Podiatry;  Laterality: Left;   APPENDECTOMY  1972   BREAST CYST ASPIRATION Left    neg   COLONOSCOPY WITH PROPOFOL  N/A 09/26/2021   Procedure: COLONOSCOPY WITH PROPOFOL ;  Surgeon: Onita Elspeth Sharper, DO;  Location: [facility] ENDOSCOPY;  Service: Gastroenterology;  Laterality: N/A;  DM, WHEELCHAIR, PLAVIX    CORONARY STENT INTERVENTION N/A 11/24/2022   Procedure: CORONARY STENT INTERVENTION;  Surgeon: Darron Deatrice LABOR, MD;  Location: [facility];  Service: Cardiovascular;  Laterality: N/A;   ESOPHAGOGASTRODUODENOSCOPY (EGD) WITH PROPOFOL  N/A 09/26/2021   Procedure: ESOPHAGOGASTRODUODENOSCOPY (EGD) WITH PROPOFOL ;  Surgeon: Onita Elspeth Sharper, DO;  Location: [facility] END[facility]COPY;  Service: Gastroenterology;  Laterality: N/A;   INCISION AND DRAINAGE OF WOUND Left 06/07/2019   Procedure: IRRIGATION AND DEBRIDEMENT WOUND;  Surgeon: Lilli Cough, DPM;  Location: [facility];  Service: Podiatry;  Laterality: Left;   KNEE ARTHROPLASTY Right 11/13/2021   Procedure: COMPUTER ASSISTED TOTAL KNEE ARTHROPLASTY;  Surgeon: Mardee Lynwood SQUIBB, MD;  Location: [facility];  Service: Orthopedics;  Laterality: Right;   LEFT HEART CATH AND CORONARY ANGIOGRAPHY Right 11/24/2022   Procedure: LEFT HEART CATH AND CORONARY ANGIOGRAPHY with possible coronary intervention;  Surgeon: Fernand Denyse LABOR, MD;  Location: [facility];  Service: Cardiovascular;  Laterality: Right;   LOWER EXTREMITY ANGIOGRAPHY Left 06/03/2019   Procedure: Lower Extremity Angiography;  Surgeon: Jama Cordella MATSU, MD;  Location: [facility];  Service: Cardiovascular;  Laterality: Left;   LOWER EXTREMITY ANGIOGRAPHY Right 05/21/2021   Procedure: LOWER EXTREMITY ANGIOGRAPHY;  Surgeon: Jama Cordella MATSU, MD;  Location: [facility];  Service: Cardiovascular;  Laterality: Right;   PERIPHERAL VASCULAR BALLOON ANGIOPLASTY Left 05/16/2019   Procedure: PERIPHERAL VASCULAR BALLOON  ANGIOPLASTY;  Surgeon: Court Dorn PARAS, MD;  Location: [facility];  Service: Cardiovascular;  Laterality: Left;  SFA   TUBAL LIGATION  1972   VEIN LIGATION AND STRIPPING     Family History  Problem Relation Age of Onset   Arthritis Mother    Heart attack Father    Cancer Sister    Cancer - Prostate Brother    Colon cancer Neg Hx    Breast cancer Neg Hx    Social History   Socioeconomic History   Marital status: Married    Spouse name: [name]   Number of children: 2   Years of education: Not on file   Highest education level: Not on file  Occupational History   Occupation: retired    Comment: Market researcher  Tobacco Use   Smoking status: Former    Current packs/day: 0.00    Average packs/day: 0.5 packs/day for 15.0 years (7.5 ttl pk-yrs)    Types: Cigarettes    Start date: 05/12/1973    Quit date: 05/12/1988    Years since quitting: 35.6   Smokeless tobacco: Never  Vaping Use   Vaping status: Never Used  Substance and Sexual Activity   Alcohol use: No    Alcohol/week: 0.0 standard drinks of alcohol   Drug use: No   Sexual activity: Yes  Other Topics Concern   Not on file  Social History Narrative   Lives at home with spouse Lyndy    Social Drivers of Health   Financial Resource Strain: Low Risk  (10/27/2023)   Overall Financial Resource Strain (CARDIA)    Difficulty of Paying Living Expenses: Not hard at all  Food Insecurity: No Food Insecurity (10/27/2023)   Hunger Vital Sign    Worried About Running Out of Food in the Last Year: Never true    Ran Out of Food in the Last Year: Never true  Transportation Needs: No Transportation Needs (10/27/2023)   PRAPARE - Administrator, Civil Service (Medical): No    Lack of Transportation (Non-Medical): No  Physical Activity: Unknown (01/15/2023)   Exercise Vital Sign    Days of Exercise per Week: 7 days    Minutes of Exercise per Session: Not on file  Stress: No Stress Concern Present  (01/15/2023)   [employer] of Occupational Health - Occupational Stress Questionnaire    Feeling of Stress : Not at all  Social Connections: Moderately Integrated (07/20/2023)   Social Connection and Isolation Panel    Frequency of Communication with Friends and Family: More than three times a week    Frequency of Social Gatherings with Friends and Family: Twice a week    Attends Religious Services: More than 4 times per year    Active Member of [employer] or Organizations: No    Attends Banker Meetings: Never    Marital Status: Married     Review of Systems  Constitutional:  Negative for appetite change and unexpected weight change.  HENT:  Negative for congestion and sinus pressure.   Respiratory:  Negative for cough and chest tightness.        Breathing stable.   Cardiovascular:  Negative for chest pain and palpitations.       No increased swelling.   Gastrointestinal:  Negative for abdominal pain, nausea and vomiting.  Genitourinary:  Negative for difficulty urinating and dysuria.  Musculoskeletal:  Negative for joint swelling and myalgias.  Skin:  Negative for color change and rash.  Neurological:  Negative for dizziness and headaches.  Psychiatric/Behavioral:  Negative for agitation and dysphoric mood.        Objective:     BP 122/68   Pulse 66   Resp 16   Ht 5' 6 (1.676 m)   Wt 200 lb (90.7 kg)   LMP 05/25/1988   SpO2 98%   BMI 32.28 kg/m  Wt Readings from Last 3 Encounters:  01/08/24 200 lb (90.7 kg)  11/19/23 200 lb (90.7 kg)  10/06/23 205 lb (93 kg)    Physical Exam Vitals reviewed.  Constitutional:      General: She is not in acute distress.    Appearance: Normal appearance.  HENT:     Head: Normocephalic and atraumatic.     Right Ear: External ear normal.     Left Ear: External ear normal.     Mouth/Throat:     Pharynx: No oropharyngeal exudate or posterior oropharyngeal erythema.  Eyes:     General: No scleral icterus.       Right  eye: No discharge.        Left eye: No discharge.  Conjunctiva/sclera: Conjunctivae normal.  Neck:     Thyroid : No thyromegaly.  Cardiovascular:     Rate and Rhythm: Normal rate and regular rhythm.  Pulmonary:     Effort: No respiratory distress.     Breath sounds: Normal breath sounds. No wheezing.  Abdominal:     General: Bowel sounds are normal.     Palpations: Abdomen is soft.     Tenderness: There is no abdominal tenderness.  Musculoskeletal:     Cervical back: Neck supple. No tenderness.     Comments: No increased swelling. - chronic venostasis.   Lymphadenopathy:     Cervical: No cervical adenopathy.  Skin:    Findings: No erythema or rash.  Neurological:     Mental Status: She is alert.  Psychiatric:        Mood and Affect: Mood normal.        Behavior: Behavior normal.         Outpatient Encounter Medications as of 01/08/2024  Medication Sig   mupirocin  ointment (BACTROBAN ) 2 % Apply 1 Application topically 2 (two) times daily.   acetaminophen  (TYLENOL ) 650 MG CR tablet Take 650-1,300 mg by mouth every 8 (eight) hours as needed for pain.   allopurinol  (ZYLOPRIM ) 100 MG tablet Take 1 tablet (100 mg total) by mouth daily.   amLODipine  (NORVASC ) 5 MG tablet Take 1 tablet (5 mg total) by mouth daily.   aspirin  EC 81 MG tablet Take 1 tablet (81 mg total) by mouth daily. Swallow whole.   atorvastatin  (LIPITOR ) 40 MG tablet Take 1 tablet (40 mg total) by mouth daily.   bisacodyl  (DULCOLAX) 10 MG suppository Place 1 suppository (10 mg total) rectally daily as needed for severe constipation.   bisacodyl  (DULCOLAX) 5 MG EC tablet Take 2 tablets (10 mg total) by mouth at bedtime.   citalopram  (CELEXA ) 10 MG tablet Take 1 tablet by mouth once daily   clopidogrel  (PLAVIX ) 75 MG tablet Take 1 tablet (75 mg total) by mouth daily with breakfast.   fexofenadine  (ALLEGRA ) 180 MG tablet Take 1 tablet (180 mg total) by mouth daily. (Patient taking differently: Take 180 mg by mouth  daily as needed for allergies.)   folic acid  (FOLVITE ) 1 MG tablet Take 1 mg by mouth every evening.    glucose blood (BAYER CONTOUR TEST) test strip USE  STRIP TO CHECK GLUCOSE TWICE DAILY. Dx: E11.9   ipratropium (ATROVENT ) 0.06 % nasal spray Place 2 sprays into both nostrils 4 (four) times daily. (Patient taking differently: Place 2 sprays into both nostrils 4 (four) times daily as needed for rhinitis.)   levothyroxine  (SYNTHROID ) 25 MCG tablet TAKE 1 TABLET BY MOUTH ONCE DAILY BEFORE BREAKFAST   lisinopril  (ZESTRIL ) 10 MG tablet Take 1 tablet by mouth once daily   magnesium  oxide (MAG-OX) 400 MG tablet Take 1 tablet (400 mg total) by mouth 2 (two) times daily.   Menthol -Zinc Oxide (CALMOSEPTINE) 0.44-20.6 % OINT Apply topically daily as needed.   methocarbamol  (ROBAXIN ) 500 MG tablet Take 1 tablet (500 mg total) by mouth every 8 (eight) hours as needed for muscle spasms.   methotrexate  (RHEUMATREX) 2.5 MG tablet Take 4 tablets (10 mg total) by mouth once a week. Caution:Chemotherapy. Protect from light.   metoprolol  succinate (TOPROL -XL) 25 MG 24 hr tablet Take 1 tablet (25 mg total) by mouth daily. Take with or immediately following a meal.   MICROLET LANCETS MISC Check sugar once daily, [employer] Microlet Lancets. Dx E11.9   nitroGLYCERIN  (NITROSTAT ) 0.4 MG SL  tablet Place 1 tablet (0.4 mg total) under the tongue every 5 (five) minutes x 3 doses as needed for chest pain.   nystatin  powder Apply 1 Application topically 3 (three) times daily.   pantoprazole  (PROTONIX ) 40 MG tablet Take 1 tablet (40 mg total) by mouth daily.   polyethylene glycol (MIRALAX  / GLYCOLAX ) 17 g packet Take 17 g by mouth 2 (two) times daily.   sodium chloride  1 g tablet Take 1 g by mouth 3 (three) times daily.   traMADol  (ULTRAM ) 50 MG tablet Take 1 tablet (50 mg total) by mouth every 8 (eight) hours as needed.   [DISCONTINUED] allopurinol  (ZYLOPRIM ) 100 MG tablet Take 1 tablet (100 mg total) by mouth daily.    [DISCONTINUED] amLODipine  (NORVASC ) 5 MG tablet Take 1 tablet (5 mg total) by mouth daily.   [DISCONTINUED] atorvastatin  (LIPITOR ) 40 MG tablet Take 1 tablet (40 mg total) by mouth daily.   [DISCONTINUED] clopidogrel  (PLAVIX ) 75 MG tablet Take 1 tablet (75 mg total) by mouth daily with breakfast.   [DISCONTINUED] magnesium  oxide (MAG-OX) 400 MG tablet Take 1 tablet (400 mg total) by mouth 2 (two) times daily.   [DISCONTINUED] methotrexate  (RHEUMATREX) 2.5 MG tablet Take 4 tablets (10 mg total) by mouth once a week. Caution:Chemotherapy. Protect from light.   [DISCONTINUED] metoprolol  succinate (TOPROL -XL) 25 MG 24 hr tablet Take 1 tablet (25 mg total) by mouth daily. Take with or immediately following a meal.   [DISCONTINUED] pantoprazole  (PROTONIX ) 40 MG tablet Take 1 tablet (40 mg total) by mouth daily.   No facility-administered encounter medications on file as of 01/08/2024.     Lab Results  Component Value Date   WBC 4.0 01/08/2024   HGB 12.2 01/08/2024   HCT 38.7 01/08/2024   PLT 144.0 (L) 01/08/2024   GLUCOSE 100 (H) 01/08/2024   CHOL 84 01/08/2024   TRIG 94.0 01/08/2024   HDL 30.80 (L) 01/08/2024   LDLCALC 35 01/08/2024   ALT 11 01/08/2024   AST 16 01/08/2024   NA 138 01/08/2024   K 4.1 01/08/2024   CL 102 01/08/2024   CREATININE 0.79 01/08/2024   BUN 16 01/08/2024   CO2 30 01/08/2024   TSH 3.08 01/08/2024   INR 1.2 07/18/2023   HGBA1C 6.1 01/08/2024   MICROALBUR 6.3 (H) 06/24/2023       Assessment & Plan:  Anemia in chronic kidney disease, unspecified CKD stage -     CBC with Differential/Platelet -     Vitamin B12 -     IBC + Ferritin  Hypercholesterolemia Assessment & Plan: Continue lipitor . Low cholesterol diet. Follow lipid panel.   Orders: -     Lipid panel -     Hepatic function panel -     Basic metabolic panel with GFR  Type 2 diabetes mellitus with hyperlipidemia [facility]) Assessment & Plan: On no medication now. Low carb diet. Follow met b and A1c.    Orders: -     Hemoglobin A1c  Hypothyroidism, unspecified type Assessment & Plan: On synthroid . Check tsh with next labs.   Orders: -     TSH  Thrombocytopenia (HCC) Assessment & Plan: Follow cbc.    Rheumatoid arthritis, involving unspecified site, unspecified whether rheumatoid factor present [facility]) Assessment & Plan: On MTX.  Has been followed by rheumatology.  Given difficulty getting to appts, we are prescribing now. Rheumatology aware. Follow cbc, kidney and liver function tewsts.    Pulmonary hypertension, mild (HCC) Assessment & Plan: Continue f/u with pulmonary/cardiology.  Acquired absence of other left toe(s) [facility]) Assessment & Plan: S/p amputation. Has been followed by [facility]. Continue risk factor modification.    Atherosclerosis of native artery of right lower extremity with rest pain [facility]) Assessment & Plan: S/p angiogram, angioplasty and toe amputation.  Continue lipitor  and plavix . No increased pain.    Aortic atherosclerosis (HCC) Assessment & Plan: Continue lipitor .    Cardiomyopathy, unspecified type [facility]) Assessment & Plan: Echocardiogram 11/2022 - EF 50-55% with grade III DD, right and left atrium moderately dilated with mild MR and mild AR. Continue spironolactone , metoprolol , losartan . No changes in medication today. Follow.    Stage 3a chronic kidney disease (HCC) Assessment & Plan: Avoid antiinflammatories.  Stay hydrated.  Follow metabolic panel. Continue losartan . Follow metabolic panel.    CREST variant of scleroderma (HCC) Assessment & Plan: Followed by rheumatology    PVD (peripheral vascular disease) (HCC) Assessment & Plan: S/p toe amputation. No pain now. Continue risk factor modification. Continue statin.    Swelling of lower extremity Assessment & Plan: Lower extremity swelling improved overall. Continue leg elevation. Follow.    Renal angiolipoma Assessment & Plan: Dr Twylla (01/30/22) - stable.  F/u one year.  Just had f/u renal ultrasound 02/2023 - no interval change.     Primary hypertension Assessment & Plan: Continue metoprolol , lisinopril  and amlodipine .   Follow pressures.  Follow metabolic panel.    Coronary artery disease involving native coronary artery of native heart without angina pectoris Assessment & Plan: Cardiology (Dr Darliss). CAD s/p PCI to RV marginal branch 11/2022 (70% OM1, 80% second diagonal).  EF 50 to 55%.  Continue aspirin , Plavix , Lipitor  80.     Nocturnal hypoxia Assessment & Plan: Recent evaluation. Discussed the need for overnight oxygen. Order signed.    Other orders -     Allopurinol ; Take 1 tablet (100 mg total) by mouth daily.  Dispense: 90 tablet; Refill: 1 -     amLODIPine  Besylate; Take 1 tablet (5 mg total) by mouth daily.  Dispense: 90 tablet; Refill: 1 -     Atorvastatin  Calcium ; Take 1 tablet (40 mg total) by mouth daily.  Dispense: 90 tablet; Refill: 1 -     Clopidogrel  Bisulfate; Take 1 tablet (75 mg total) by mouth daily with breakfast.  Dispense: 90 tablet; Refill: 1 -     Magnesium  Oxide; Take 1 tablet (400 mg total) by mouth 2 (two) times daily.  Dispense: 180 tablet; Refill: 1 -     Methotrexate  Sodium; Take 4 tablets (10 mg total) by mouth once a week. Caution:Chemotherapy. Protect from light.  Dispense: 48 tablet; Refill: 0 -     Metoprolol  Succinate ER; Take 1 tablet (25 mg total) by mouth daily. Take with or immediately following a meal.  Dispense: 90 tablet; Refill: 1 -     Pantoprazole  Sodium; Take 1 tablet (40 mg total) by mouth daily.  Dispense: 90 tablet; Refill: 3 -     Mupirocin ; Apply 1 Application topically 2 (two) times daily.  Dispense: 22 g; Refill: 0     Allena Hamilton, MD

## 2024-01-10 ENCOUNTER — Ambulatory Visit: Payer: Self-pay | Admitting: Internal Medicine

## 2024-01-10 ENCOUNTER — Encounter: Payer: Self-pay | Admitting: Internal Medicine

## 2024-01-10 DIAGNOSIS — G4734 Idiopathic sleep related nonobstructive alveolar hypoventilation: Secondary | ICD-10-CM | POA: Insufficient documentation

## 2024-01-10 NOTE — Assessment & Plan Note (Addendum)
 Continue metoprolol , lisinopril  and amlodipine .   Follow pressures.  Follow metabolic panel.

## 2024-01-10 NOTE — Assessment & Plan Note (Signed)
 Avoid antiinflammatories.  Stay hydrated.  Follow metabolic panel. Continue losartan . Follow metabolic panel.

## 2024-01-10 NOTE — Assessment & Plan Note (Signed)
 Followed by rheumatology.

## 2024-01-10 NOTE — Assessment & Plan Note (Signed)
 On no medication now. Low carb diet. Follow met b and A1c.

## 2024-01-10 NOTE — Assessment & Plan Note (Signed)
 Follow cbc.

## 2024-01-10 NOTE — Assessment & Plan Note (Signed)
 Continue lipitor . Low cholesterol diet. Follow lipid panel.

## 2024-01-10 NOTE — Assessment & Plan Note (Signed)
 S/p angiogram, angioplasty and toe amputation.  Continue lipitor  and plavix . No increased pain.

## 2024-01-10 NOTE — Assessment & Plan Note (Signed)
 S/p amputation. Has been followed by [facility]. Continue risk factor modification.

## 2024-01-10 NOTE — Assessment & Plan Note (Signed)
 Continue f/u with pulmonary/cardiology.

## 2024-01-10 NOTE — Assessment & Plan Note (Signed)
 Cardiology (Dr Azucena Cecil). CAD s/p PCI to RV marginal branch 11/2022 (70% OM1, 80% second diagonal).  EF 50 to 55%.  Continue aspirin, Plavix, Lipitor 80.

## 2024-01-10 NOTE — Assessment & Plan Note (Signed)
 Continue lipitor  ?

## 2024-01-10 NOTE — Assessment & Plan Note (Signed)
 Dr Lonna Cobb (01/30/22) - stable.  F/u one year. Just had f/u renal ultrasound 02/2023 - no interval change.

## 2024-01-10 NOTE — Assessment & Plan Note (Signed)
 On MTX.  Has been followed by rheumatology.  Given difficulty getting to appts, we are prescribing now. Rheumatology aware. Follow cbc, kidney and liver function tewsts.

## 2024-01-10 NOTE — Assessment & Plan Note (Signed)
On synthroid.  Check tsh with next labs.   

## 2024-01-10 NOTE — Assessment & Plan Note (Signed)
 Echocardiogram 11/2022 - EF 50-55% with grade III DD, right and left atrium moderately dilated with mild MR and mild AR. Continue spironolactone , metoprolol , losartan . No changes in medication today. Follow.

## 2024-01-10 NOTE — Assessment & Plan Note (Signed)
 Recent evaluation. Discussed the need for overnight oxygen. Order signed.

## 2024-01-10 NOTE — Assessment & Plan Note (Signed)
 Lower extremity swelling improved overall. Continue leg elevation. Follow.

## 2024-01-10 NOTE — Assessment & Plan Note (Signed)
 S/p toe amputation. No pain now. Continue risk factor modification. Continue statin.

## 2024-02-02 ENCOUNTER — Ambulatory Visit: Admitting: Internal Medicine

## 2024-02-02 VITALS — BP 116/68 | HR 62 | Resp 16

## 2024-02-02 DIAGNOSIS — M341 CR(E)ST syndrome: Secondary | ICD-10-CM

## 2024-02-02 DIAGNOSIS — I700 Atherosclerosis of aorta: Secondary | ICD-10-CM | POA: Diagnosis not present

## 2024-02-02 DIAGNOSIS — K2270 Barrett's esophagus without dysplasia: Secondary | ICD-10-CM

## 2024-02-02 DIAGNOSIS — M25571 Pain in right ankle and joints of right foot: Secondary | ICD-10-CM

## 2024-02-02 DIAGNOSIS — Z23 Encounter for immunization: Secondary | ICD-10-CM | POA: Diagnosis not present

## 2024-02-02 DIAGNOSIS — I272 Pulmonary hypertension, unspecified: Secondary | ICD-10-CM

## 2024-02-02 DIAGNOSIS — E7800 Pure hypercholesterolemia, unspecified: Secondary | ICD-10-CM

## 2024-02-02 DIAGNOSIS — I70221 Atherosclerosis of native arteries of extremities with rest pain, right leg: Secondary | ICD-10-CM

## 2024-02-02 DIAGNOSIS — K2100 Gastro-esophageal reflux disease with esophagitis, without bleeding: Secondary | ICD-10-CM

## 2024-02-02 DIAGNOSIS — I429 Cardiomyopathy, unspecified: Secondary | ICD-10-CM

## 2024-02-02 DIAGNOSIS — R29898 Other symptoms and signs involving the musculoskeletal system: Secondary | ICD-10-CM

## 2024-02-02 DIAGNOSIS — D1771 Benign lipomatous neoplasm of kidney: Secondary | ICD-10-CM

## 2024-02-02 DIAGNOSIS — E1169 Type 2 diabetes mellitus with other specified complication: Secondary | ICD-10-CM

## 2024-02-02 DIAGNOSIS — E039 Hypothyroidism, unspecified: Secondary | ICD-10-CM

## 2024-02-02 DIAGNOSIS — Z89422 Acquired absence of other left toe(s): Secondary | ICD-10-CM

## 2024-02-02 DIAGNOSIS — M069 Rheumatoid arthritis, unspecified: Secondary | ICD-10-CM

## 2024-02-02 DIAGNOSIS — I2510 Atherosclerotic heart disease of native coronary artery without angina pectoris: Secondary | ICD-10-CM

## 2024-02-02 DIAGNOSIS — I10 Essential (primary) hypertension: Secondary | ICD-10-CM

## 2024-02-02 DIAGNOSIS — E785 Hyperlipidemia, unspecified: Secondary | ICD-10-CM

## 2024-02-02 NOTE — Progress Notes (Signed)
 Subjective:    Patient ID: Nina Perez, female    DOB: [birth year], 83 y.o.   MRN: [id number]  Patient here for  Chief Complaint  Patient presents with   Medical Management of Chronic Issues    HPI Here for a scheduled follow up -  follow up regarding CAD, diabetes, hypercholesterolemia and hypertension. Discharged from [facility] 09/23/23. 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 Also had multiple falls prior to admission. Was on aspirin  and plavix . Residual left arm and leg weakness. MRI c-spine - mild spinal canal stenosis at C4-5 and C5-6, severe left C4-5 and C5-6 neural foraminal stenosis. CT angio head and neck - no large vessel occlusion, enlarged main pulmonary arteries, aortic atherosclerosis. MRI brain - many punctate acute infarcts in the right > left frontal and parietal lobes and the cerebellum. Saw neurology - 09/02/23 - recommended to continue aspirin  and plavix . Hold on medication for memory. Recommended f/u with cardiology - f/u cardiac monitor revealed normal rhythm with average heart rate 65 bpm, no afib or aflutter, one run SVT. Recommended EP referral to consider ILR. Patient and family are not interested in loop recorder at this time. Per review, cardiac monitor revealed normal rhythm with average heart rate 65, no afib or aflutter, one run of fast heart rhythm and rare irregular beats. Had f/u with [facility] 08/19/23 - f/u regarding c-spine arthritis, etc. Elected to follow. Saw cardiology 11/19/23 - recommended to continue aspirin , plavix , toprol  and atorvastatin . She is accompanied by her husband. History obtained from both of them. Feels breathing is stable. No chest pain. No abdominal pain. Does report right ankle pain. No known injury.    Past Medical History:  Diagnosis Date   Adenomatous colon polyp    Aortic atherosclerosis    Barrett's esophagus    Cardiac murmur    CKD (chronic kidney disease), stage III (HCC)    Dyspnea on exertion    GERD (gastroesophageal reflux  disease)    Gout    Hiatal hernia    History of 2019 novel coronavirus disease (COVID-19) 08/2020   Hypercholesterolemia    Hypertension    ILD (interstitial lung disease) (HCC)    a.) 2/2 scleroderma Dx   LBBB (left bundle branch block)    Long term current use of antithrombotics/antiplatelets    a.) ASA + clopidogrel    Long term current use of immunosuppressive drug    a.) MTX for RA and scleroderma Dx   OSA on CPAP    Osteoarthritis    a.) knees, spine   PAD (peripheral artery disease)    Phlebitis    a.) x 2 (with pregnancy)   Pulmonary fibrosis (HCC)    a.) mild   Pulmonary hypertension (HCC) 02/24/2012   a.) TTE 02/24/2012: EF 55%, RVSP 33; b.) TTE 10/12/2014: EF >55%, RVSP 36.1; c.) TTE 09/02/2017: EF 55%, RVSP 45.5   Reflux esophagitis    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 b.) renal US  05/23/2020: solid LEFT renal mass measuring 1.4 x 1.5 x 1.6. 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, sclerodactyly, telangiectasias   Skin cancer 2018   Supplemental oxygen dependent    a.)  2L/[place] PRN for DOE related to mild pulmonary fibrosis and pulmonary HTN   T2DM (type 2 diabetes mellitus) (HCC)    Past Surgical History:  Procedure Laterality Date   ABDOMINAL AORTOGRAM W/LOWER EXTREMITY Left 05/16/2019   Procedure: ABDOMINAL AORTOGRAM W/LOWER EXTREMITY;  Surgeon: Court Dorn PARAS, MD;  [facility]: [facility];  Service: Cardiovascular;  Laterality: Left;   AMPUTATION TOE Left 06/02/2019   Procedure: AMPUTATION TOE;  Surgeon: Lilli Cough, DPM;  Location: [facility];  Service: Podiatry;  Laterality: Left;   APPENDECTOMY  1972   BREAST CYST ASPIRATION Left    neg   COLONOSCOPY  WITH PROPOFOL  N/A 09/26/2021   Procedure: COLONOSCOPY WITH PROPOFOL ;  Surgeon: Onita Elspeth Sharper, DO;  Location: [facility] ENDOSCOPY;  Service: Gastroenterology;  Laterality: N/A;  DM, WHEELCHAIR, PLAVIX    CORONARY STENT INTERVENTION N/A 11/24/2022   Procedure: CORONARY STENT INTERVENTION;  Surgeon: Darron Deatrice LABOR, MD;  Location: [facility];  Service: Cardiovascular;  Laterality: N/A;   ESOPHAGOGASTRODUODENOSCOPY (EGD) WITH PROPOFOL  N/A 09/26/2021   Procedure: ESOPHAGOGASTRODUODENOSCOPY (EGD) WITH PROPOFOL ;  Surgeon: Onita Elspeth Sharper, DO;  Location: [facility] ENDOSCOPY;  Service: Gastroenterology;  Laterality: N/A;   INCISION AND DRAINAGE OF WOUND Left 06/07/2019   Procedure: IRRIGATION AND DEBRIDEMENT WOUND;  Surgeon: Lilli Cough, DPM;  Location: [facility];  Service: Podiatry;  Laterality: Left;   KNEE ARTHROPLASTY Right 11/13/2021   Procedure: COMPUTER ASSISTED TOTAL KNEE ARTHROPLASTY;  Surgeon: Mardee Lynwood SQUIBB, MD;  Location: [facility];  Service: Orthopedics;  Laterality: Right;   LEFT HEART CATH AND CORONARY ANGIOGRAPHY Right 11/24/2022   Procedure: LEFT HEART CATH AND CORONARY ANGIOGRAPHY with possible coronary intervention;  Surgeon: Fernand Denyse LABOR, MD;  Location: [facility];  Service: Cardiovascular;  Laterality: Right;   LOWER EXTREMITY ANGIOGRAPHY Left 06/03/2019   Procedure: Lower Extremity Angiography;  Surgeon: Jama Cordella MATSU, MD;  Location: [facility];  Service: Cardiovascular;  Laterality: Left;   LOWER EXTREMITY ANGIOGRAPHY Right 05/21/2021   Procedure: LOWER EXTREMITY ANGIOGRAPHY;  Surgeon: Jama Cordella MATSU, MD;  Location: [facility];  Service: Cardiovascular;  Laterality: Right;   PERIPHERAL VASCULAR BALLOON ANGIOPLASTY Left 05/16/2019   Procedure: PERIPHERAL VASCULAR BALLOON ANGIOPLASTY;  Surgeon: Court Dorn PARAS, MD;  Location: [facility];  Service: Cardiovascular;  Laterality: Left;  SFA   TUBAL LIGATION  1972   VEIN  LIGATION AND STRIPPING     Family History  Problem Relation Age of Onset   Arthritis Mother    Heart attack Father    Cancer Sister    Cancer - Prostate Brother    Colon cancer Neg Hx    Breast cancer Neg Hx    Social History   Socioeconomic History   Marital status: Married    Spouse name: [name]   Number of children: 2   Years of education: Not on file   Highest education level: Not on file  Occupational History   Occupation: retired    Comment: Market researcher  Tobacco Use   Smoking status: Former    Current packs/day: 0.00    Average packs/day: 0.5 packs/day for 15.0 years (7.5 ttl pk-yrs)    Types: Cigarettes    Start date: 05/12/1973    Quit date: 05/12/1988    Years since quitting: 35.7   Smokeless tobacco: Never  Vaping Use   Vaping status: Never Used  Substance and Sexual Activity   Alcohol use: No    Alcohol/week: 0.0 standard drinks of alcohol  Drug use: No   Sexual activity: Yes  Other Topics Concern   Not on file  Social History Narrative   Lives at home with spouse Lyndy    Social Drivers of Health   Financial Resource Strain: Low Risk  (10/27/2023)   Overall Financial Resource Strain (CARDIA)    Difficulty of Paying Living Expenses: Not hard at all  Food Insecurity: No Food Insecurity (10/27/2023)   Hunger Vital Sign    Worried About Running Out of Food in the Last Year: Never true    Ran Out of Food in the Last Year: Never true  Transportation Needs: No Transportation Needs (10/27/2023)   PRAPARE - Administrator, Civil Service (Medical): No    Lack of Transportation (Non-Medical): No  Physical Activity: Unknown (01/15/2023)   Exercise Vital Sign    Days of Exercise per Week: 7 days    Minutes of Exercise per Session: Not on file  Stress: No Stress Concern Present (01/15/2023)   [employer] of Occupational Health - Occupational Stress Questionnaire    Feeling of Stress : Not at all  Social Connections: Moderately  Integrated (07/20/2023)   Social Connection and Isolation Panel    Frequency of Communication with Friends and Family: More than three times a week    Frequency of Social Gatherings with Friends and Family: Twice a week    Attends Religious Services: More than 4 times per year    Active Member of [employer] or Organizations: No    Attends Banker Meetings: Never    Marital Status: Married     Review of Systems  Constitutional:  Negative for appetite change and unexpected weight change.  HENT:  Negative for congestion and sinus pressure.   Respiratory:  Negative for cough, chest tightness and shortness of breath.   Cardiovascular:  Negative for chest pain and palpitations.       Leg swelling - improved.   Gastrointestinal:  Negative for abdominal pain, diarrhea, nausea and vomiting.  Genitourinary:  Negative for difficulty urinating and dysuria.  Musculoskeletal:  Negative for joint swelling and myalgias.       Right ankle pain as outlined.   Skin:  Negative for color change and rash.  Neurological:  Negative for dizziness and headaches.  Psychiatric/Behavioral:  Negative for agitation and dysphoric mood.        Objective:     BP 116/68   Pulse 62   Resp 16   LMP 05/25/1988   SpO2 98%  Wt Readings from Last 3 Encounters:  01/08/24 200 lb (90.7 kg)  11/19/23 200 lb (90.7 kg)  10/06/23 205 lb (93 kg)    Physical Exam Vitals reviewed.  Constitutional:      General: She is not in acute distress.    Appearance: Normal appearance.  HENT:     Head: Normocephalic and atraumatic.     Right Ear: External ear normal.     Left Ear: External ear normal.     Mouth/Throat:     Pharynx: No oropharyngeal exudate or posterior oropharyngeal erythema.  Eyes:     General: No scleral icterus.       Right eye: No discharge.        Left eye: No discharge.     Conjunctiva/sclera: Conjunctivae normal.  Neck:     Thyroid : No thyromegaly.  Cardiovascular:     Rate and Rhythm:  Normal rate and regular rhythm.  Pulmonary:     Effort: No respiratory distress.  Breath sounds: Normal breath sounds. No wheezing.  Abdominal:     General: Bowel sounds are normal.     Palpations: Abdomen is soft.     Tenderness: There is no abdominal tenderness.  Musculoskeletal:        General: No tenderness.     Cervical back: Neck supple. No tenderness.     Comments: Swelling - improved.   Lymphadenopathy:     Cervical: No cervical adenopathy.  Skin:    Findings: No erythema or rash.  Neurological:     Mental Status: She is alert.  Psychiatric:        Mood and Affect: Mood normal.        Behavior: Behavior normal.         Outpatient Encounter Medications as of 02/02/2024  Medication Sig   acetaminophen  (TYLENOL ) 650 MG CR tablet Take 650-1,300 mg by mouth every 8 (eight) hours as needed for pain.   allopurinol  (ZYLOPRIM ) 100 MG tablet Take 1 tablet (100 mg total) by mouth daily.   amLODipine  (NORVASC ) 5 MG tablet Take 1 tablet (5 mg total) by mouth daily.   aspirin  EC 81 MG tablet Take 1 tablet (81 mg total) by mouth daily. Swallow whole.   atorvastatin  (LIPITOR ) 40 MG tablet Take 1 tablet (40 mg total) by mouth daily.   bisacodyl  (DULCOLAX) 10 MG suppository Place 1 suppository (10 mg total) rectally daily as needed for severe constipation.   bisacodyl  (DULCOLAX) 5 MG EC tablet Take 2 tablets (10 mg total) by mouth at bedtime.   citalopram  (CELEXA ) 10 MG tablet Take 1 tablet by mouth once daily   clopidogrel  (PLAVIX ) 75 MG tablet Take 1 tablet (75 mg total) by mouth daily with breakfast.   fexofenadine  (ALLEGRA ) 180 MG tablet Take 1 tablet (180 mg total) by mouth daily. (Patient taking differently: Take 180 mg by mouth daily as needed for allergies.)   folic acid  (FOLVITE ) 1 MG tablet Take 1 mg by mouth every evening.    glucose blood (BAYER CONTOUR TEST) test strip USE  STRIP TO CHECK GLUCOSE TWICE DAILY. Dx: E11.9   ipratropium (ATROVENT ) 0.06 % nasal spray Place 2  sprays into both nostrils 4 (four) times daily. (Patient taking differently: Place 2 sprays into both nostrils 4 (four) times daily as needed for rhinitis.)   levothyroxine  (SYNTHROID ) 25 MCG tablet TAKE 1 TABLET BY MOUTH ONCE DAILY BEFORE BREAKFAST   lisinopril  (ZESTRIL ) 10 MG tablet Take 1 tablet by mouth once daily   magnesium  oxide (MAG-OX) 400 MG tablet Take 1 tablet (400 mg total) by mouth 2 (two) times daily.   Menthol -Zinc Oxide (CALMOSEPTINE) 0.44-20.6 % OINT Apply topically daily as needed.   methocarbamol  (ROBAXIN ) 500 MG tablet Take 1 tablet (500 mg total) by mouth every 8 (eight) hours as needed for muscle spasms.   methotrexate  (RHEUMATREX) 2.5 MG tablet Take 4 tablets (10 mg total) by mouth once a week. Caution:Chemotherapy. Protect from light.   metoprolol  succinate (TOPROL -XL) 25 MG 24 hr tablet Take 1 tablet (25 mg total) by mouth daily. Take with or immediately following a meal.   MICROLET LANCETS MISC Check sugar once daily, [employer] Microlet Lancets. Dx E11.9   mupirocin  ointment (BACTROBAN ) 2 % Apply 1 Application topically 2 (two) times daily.   nitroGLYCERIN  (NITROSTAT ) 0.4 MG SL tablet Place 1 tablet (0.4 mg total) under the tongue every 5 (five) minutes x 3 doses as needed for chest pain.   nystatin  powder Apply 1 Application topically 3 (three) times  daily.   pantoprazole  (PROTONIX ) 40 MG tablet Take 1 tablet (40 mg total) by mouth daily.   polyethylene glycol (MIRALAX  / GLYCOLAX ) 17 g packet Take 17 g by mouth 2 (two) times daily.   sodium chloride  1 g tablet Take 1 g by mouth 3 (three) times daily.   traMADol  (ULTRAM ) 50 MG tablet Take 1 tablet (50 mg total) by mouth every 8 (eight) hours as needed.   No facility-administered encounter medications on file as of 02/02/2024.     Lab Results  Component Value Date   WBC 4.0 01/08/2024   HGB 12.2 01/08/2024   HCT 38.7 01/08/2024   PLT 144.0 (L) 01/08/2024   GLUCOSE 100 (H) 01/08/2024   CHOL 84 01/08/2024   TRIG 94.0  01/08/2024   HDL 30.80 (L) 01/08/2024   LDLCALC 35 01/08/2024   ALT 11 01/08/2024   AST 16 01/08/2024   NA 138 01/08/2024   K 4.1 01/08/2024   CL 102 01/08/2024   CREATININE 0.79 01/08/2024   BUN 16 01/08/2024   CO2 30 01/08/2024   TSH 3.08 01/08/2024   INR 1.2 07/18/2023   HGBA1C 6.1 01/08/2024   MICROALBUR 6.3 (H) 06/24/2023       Assessment & Plan:  Immunization due -     Flu vaccine HIGH DOSE PF(Fluzone Trivalent)  Acquired absence of other left toe(s) Assessment & Plan: S/p amputation. Has been followed by [facility]. Continue risk factor modification.    Aortic atherosclerosis Assessment & Plan: Continue lipitor .    Atherosclerosis of native artery of right lower extremity with rest pain [facility]) Assessment & Plan: S/p angiogram, angioplasty and toe amputation.  Continue lipitor  and plavix . No pain now.    Barrett's esophagus without dysplasia Assessment & Plan: Continue protonix . No upper symptoms reported.    Coronary artery disease involving native coronary artery of native heart without angina pectoris Assessment & Plan: Cardiology (Dr Darliss). CAD s/p PCI to RV marginal branch 11/2022 (70% OM1, 80% second diagonal).  EF 50 to 55%.  Continue aspirin , Plavix , Lipitor  80.     Cardiomyopathy, unspecified type [facility]) Assessment & Plan: Echocardiogram 11/2022 - EF 50-55% with grade III DD, right and left atrium moderately dilated with mild MR and mild AR. Continue spironolactone , metoprolol , losartan . No evidence of volume overload on exam. No changes in medication. Follow.    CREST variant of scleroderma (HCC) Assessment & Plan: Followed by rheumatology.    Gastroesophageal reflux disease with esophagitis without hemorrhage Assessment & Plan: No upper symptoms reported. Continue protonix .    Hypercholesterolemia Assessment & Plan: Continue lipitor . Low cholesterol diet. Follow lipid panel.    Primary hypertension Assessment & Plan: Continue  metoprolol , lisinopril  and amlodipine .   Follow pressures. Follow metabolic panel. No changes in medication today.    Hypothyroidism, unspecified type Assessment & Plan: On synthroid . Follow tsh.    Weakness of lower extremity, unspecified laterality Assessment & Plan: Weakness. Unable to stand without help. Family using a sit /stand lift.   Pulmonary hypertension, mild (HCC) Assessment & Plan: Continue f/u with pulmonary/cardiology.    Renal angiolipoma Assessment & Plan: Dr Twylla (01/30/22) - stable.  F/u one year. Just had f/u renal ultrasound 02/2023 - no interval change.     Rheumatoid arthritis, involving unspecified site, unspecified whether rheumatoid factor present [facility]) Assessment & Plan: On MTX.  Has been followed by rheumatology.  Given difficulty getting to appts, we are prescribing now. Rheumatology aware. Follow cbc, kidney and liver function tests. Overall stable.  Type 2 diabetes mellitus with hyperlipidemia [facility]) Assessment & Plan: On no medication now. Low carb diet. Follow met b and A1c.    Right ankle pain, unspecified chronicity Assessment & Plan: No pain with flexion, extension or rotation of her ankle. Localized pain - lateral aspect of ankle. No skin breakdown. Discussed avoiding increased pressure on the area. Follow. Call with update.       Allena Hamilton, MD

## 2024-02-07 ENCOUNTER — Encounter: Payer: Self-pay | Admitting: Internal Medicine

## 2024-02-07 DIAGNOSIS — M25571 Pain in right ankle and joints of right foot: Secondary | ICD-10-CM | POA: Insufficient documentation

## 2024-02-07 NOTE — Assessment & Plan Note (Signed)
 On MTX.  Has been followed by rheumatology.  Given difficulty getting to appts, we are prescribing now. Rheumatology aware. Follow cbc, kidney and liver function tests. Overall stable.

## 2024-02-07 NOTE — Assessment & Plan Note (Signed)
 Cardiology (Dr Azucena Cecil). CAD s/p PCI to RV marginal branch 11/2022 (70% OM1, 80% second diagonal).  EF 50 to 55%.  Continue aspirin, Plavix, Lipitor 80.

## 2024-02-07 NOTE — Assessment & Plan Note (Signed)
On synthroid.  Follow tsh.   

## 2024-02-07 NOTE — Assessment & Plan Note (Signed)
 Continue lipitor . Low cholesterol diet. Follow lipid panel.

## 2024-02-07 NOTE — Assessment & Plan Note (Signed)
 On no medication now. Low carb diet. Follow met b and A1c.

## 2024-02-07 NOTE — Assessment & Plan Note (Signed)
 No upper symptoms reported.  Continue protonix.

## 2024-02-07 NOTE — Assessment & Plan Note (Signed)
 Weakness. Unable to stand without help. Family using a sit /stand lift.

## 2024-02-07 NOTE — Assessment & Plan Note (Signed)
 Continue f/u with pulmonary/cardiology.

## 2024-02-07 NOTE — Assessment & Plan Note (Signed)
 Continue metoprolol , lisinopril  and amlodipine .   Follow pressures. Follow metabolic panel. No changes in medication today.

## 2024-02-07 NOTE — Assessment & Plan Note (Signed)
Continue protonix.  No upper symptoms reported.

## 2024-02-07 NOTE — Assessment & Plan Note (Signed)
 S/p amputation. Has been followed by [facility]. Continue risk factor modification.

## 2024-02-07 NOTE — Assessment & Plan Note (Signed)
 S/p angiogram, angioplasty and toe amputation.  Continue lipitor  and plavix . No pain now.

## 2024-02-07 NOTE — Assessment & Plan Note (Signed)
 Dr Lonna Cobb (01/30/22) - stable.  F/u one year. Just had f/u renal ultrasound 02/2023 - no interval change.

## 2024-02-07 NOTE — Assessment & Plan Note (Signed)
 Echocardiogram 11/2022 - EF 50-55% with grade III DD, right and left atrium moderately dilated with mild MR and mild AR. Continue spironolactone , metoprolol , losartan . No evidence of volume overload on exam. No changes in medication. Follow.

## 2024-02-07 NOTE — Assessment & Plan Note (Signed)
 Followed by rheumatology.

## 2024-02-07 NOTE — Assessment & Plan Note (Signed)
 Continue lipitor  ?

## 2024-02-07 NOTE — Assessment & Plan Note (Signed)
 No pain with flexion, extension or rotation of her ankle. Localized pain - lateral aspect of ankle. No skin breakdown. Discussed avoiding increased pressure on the area. Follow. Call with update.

## 2024-02-09 ENCOUNTER — Ambulatory Visit (INDEPENDENT_AMBULATORY_CARE_PROVIDER_SITE_OTHER)

## 2024-02-09 VITALS — Ht 66.0 in | Wt 200.0 lb

## 2024-02-09 DIAGNOSIS — Z0000 Encounter for general adult medical examination without abnormal findings: Secondary | ICD-10-CM | POA: Diagnosis not present

## 2024-02-09 NOTE — Progress Notes (Signed)
 Subjective:   Nina Perez is a 83 y.o. who presents for a Medicare Wellness preventive visit.  As a reminder, Annual Wellness Visits don't include a physical exam, and some assessments may be limited, especially if this visit is performed virtually. We may recommend an in-person follow-up visit with your provider if needed.  Visit Complete: Virtual I connected with  Nina Perez on 02/09/24 by a audio enabled telemedicine application and verified that I am speaking with the correct person using two identifiers.  Patient Location: Home  Provider Location: Home Office  I discussed the limitations of evaluation and management by telemedicine. The patient expressed understanding and agreed to proceed.  Vital Signs: Because this visit was a virtual/telehealth visit, some criteria may be missing or patient reported. Any vitals not documented were not able to be obtained and vitals that have been documented are patient reported.  VideoDeclined- This patient declined Librarian, academic. Therefore the visit was completed with audio only.  Persons Participating in Visit: Patient.  AWV Questionnaire: No: Patient Medicare AWV questionnaire was not completed prior to this visit.  Cardiac Risk Factors include: advanced age (>90men, >85 women);diabetes mellitus;dyslipidemia;hypertension     Objective:    Today's Vitals   02/09/24 0856  Weight: 200 lb (90.7 kg)  Height: 5' 6 (1.676 m)   Body mass index is 32.28 kg/m.     02/09/2024    9:09 AM 07/20/2023    7:00 AM 07/18/2023   12:40 PM 04/13/2023   10:54 AM 04/13/2023   10:30 AM 04/07/2023    1:57 PM 04/06/2023    3:13 PM  Advanced Directives  Does Patient Have a Medical Advance Directive? No Yes Yes  No Yes No  Type of Special educational needs teacher of [employer] Power of [place];Living will      Does patient want to make changes to medical advance directive?  No - Patient declined   No -  Patient declined    Copy of Healthcare Power of Attorney in Chart?  No - copy requested       Would patient like information on creating a medical advance directive? Yes (MAU/Ambulatory/Procedural Areas - Information given)   No - Patient declined       Current Medications (verified) Outpatient Encounter Medications as of 02/09/2024  Medication Sig   acetaminophen  (TYLENOL ) 650 MG CR tablet Take 650-1,300 mg by mouth every 8 (eight) hours as needed for pain.   allopurinol  (ZYLOPRIM ) 100 MG tablet Take 1 tablet (100 mg total) by mouth daily.   amLODipine  (NORVASC ) 5 MG tablet Take 1 tablet (5 mg total) by mouth daily.   aspirin  EC 81 MG tablet Take 1 tablet (81 mg total) by mouth daily. Swallow whole.   atorvastatin  (LIPITOR ) 40 MG tablet Take 1 tablet (40 mg total) by mouth daily.   bisacodyl  (DULCOLAX) 10 MG suppository Place 1 suppository (10 mg total) rectally daily as needed for severe constipation.   bisacodyl  (DULCOLAX) 5 MG EC tablet Take 2 tablets (10 mg total) by mouth at bedtime.   citalopram  (CELEXA ) 10 MG tablet Take 1 tablet by mouth once daily   clopidogrel  (PLAVIX ) 75 MG tablet Take 1 tablet (75 mg total) by mouth daily with breakfast.   fexofenadine  (ALLEGRA ) 180 MG tablet Take 1 tablet (180 mg total) by mouth daily. (Patient taking differently: Take 180 mg by mouth daily as needed for allergies.)   folic acid  (FOLVITE ) 1 MG tablet Take 1 mg by  mouth every evening.    glucose blood (BAYER CONTOUR TEST) test strip USE  STRIP TO CHECK GLUCOSE TWICE DAILY. Dx: E11.9   ipratropium (ATROVENT ) 0.06 % nasal spray Place 2 sprays into both nostrils 4 (four) times daily. (Patient taking differently: Place 2 sprays into both nostrils 4 (four) times daily as needed for rhinitis.)   levothyroxine  (SYNTHROID ) 25 MCG tablet TAKE 1 TABLET BY MOUTH ONCE DAILY BEFORE BREAKFAST   lisinopril  (ZESTRIL ) 10 MG tablet Take 1 tablet by mouth once daily   magnesium  oxide (MAG-OX) 400 MG tablet Take 1  tablet (400 mg total) by mouth 2 (two) times daily.   Menthol -Zinc Oxide (CALMOSEPTINE) 0.44-20.6 % OINT Apply topically daily as needed.   methocarbamol  (ROBAXIN ) 500 MG tablet Take 1 tablet (500 mg total) by mouth every 8 (eight) hours as needed for muscle spasms.   methotrexate  (RHEUMATREX) 2.5 MG tablet Take 4 tablets (10 mg total) by mouth once a week. Caution:Chemotherapy. Protect from light.   metoprolol  succinate (TOPROL -XL) 25 MG 24 hr tablet Take 1 tablet (25 mg total) by mouth daily. Take with or immediately following a meal.   MICROLET LANCETS MISC Check sugar once daily, [employer] Microlet Lancets. Dx E11.9   mupirocin  ointment (BACTROBAN ) 2 % Apply 1 Application topically 2 (two) times daily.   nitroGLYCERIN  (NITROSTAT ) 0.4 MG SL tablet Place 1 tablet (0.4 mg total) under the tongue every 5 (five) minutes x 3 doses as needed for chest pain.   nystatin  powder Apply 1 Application topically 3 (three) times daily.   pantoprazole  (PROTONIX ) 40 MG tablet Take 1 tablet (40 mg total) by mouth daily.   polyethylene glycol (MIRALAX  / GLYCOLAX ) 17 g packet Take 17 g by mouth 2 (two) times daily.   sodium chloride  1 g tablet Take 1 g by mouth 3 (three) times daily.   traMADol  (ULTRAM ) 50 MG tablet Take 1 tablet (50 mg total) by mouth every 8 (eight) hours as needed. (Patient not taking: Reported on 02/09/2024)   No facility-administered encounter medications on file as of 02/09/2024.    Allergies (verified) Ethanol, Tape, Triamterene-hctz, Cephalexin , and Hydrochlorothiazide-triamterene   History: Past Medical History:  Diagnosis Date   Adenomatous colon polyp    Aortic atherosclerosis    Barrett's esophagus    Cardiac murmur    CKD (chronic kidney disease), stage III (HCC)    Dyspnea on exertion    GERD (gastroesophageal reflux disease)    Gout    Hiatal hernia    History of 2019 novel coronavirus disease (COVID-19) 08/2020   Hypercholesterolemia    Hypertension    ILD  (interstitial lung disease) (HCC)    a.) 2/2 scleroderma Dx   LBBB (left bundle branch block)    Long term current use of antithrombotics/antiplatelets    a.) ASA + clopidogrel    Long term current use of immunosuppressive drug    a.) MTX for RA and scleroderma Dx   OSA on CPAP    Osteoarthritis    a.) knees, spine   PAD (peripheral artery disease)    Phlebitis    a.) x 2 (with pregnancy)   Pulmonary fibrosis (HCC)    a.) mild   Pulmonary hypertension (HCC) 02/24/2012   a.) TTE 02/24/2012: EF 55%, RVSP 33; b.) TTE 10/12/2014: EF >55%, RVSP 36.1; c.) TTE 09/02/2017: EF 55%, RVSP 45.5   Reflux esophagitis    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  b.) renal US  05/23/2020: solid LEFT renal mass measuring 1.4 x 1.5 x 1.6. 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, sclerodactyly, telangiectasias   Skin cancer 2018   Supplemental oxygen dependent    a.) 2L/[place] PRN for DOE related to mild pulmonary fibrosis and pulmonary HTN   T2DM (type 2 diabetes mellitus) (HCC)    Past Surgical History:  Procedure Laterality Date   ABDOMINAL AORTOGRAM W/LOWER EXTREMITY Left 05/16/2019   Procedure: ABDOMINAL AORTOGRAM W/LOWER EXTREMITY;  Surgeon: Court Dorn PARAS, MD;  [facility];  Service: Cardiovascular;  Laterality: Left;   AMPUTATION TOE Left 06/02/2019   Procedure: AMPUTATION TOE;  Surgeon: Lilli Cough, DPM;  Location: [facility] OR[facility];  Service: Podiatry;  Laterality: Left;   APPENDECTOMY  1972   BREAST CYST ASPIRATION Left    neg   COLONOSCOPY WITH PROPOFOL  N/A 09/26/2021   Procedure: COLONOSCOPY WITH PROPOFOL ;  Surgeon: Onita Elspeth Sharper, DO;  Location: [facility] ENDOSCOPY;  Service:  Gastroenterology;  Laterality: N/A;  DM, WHEELCHAIR, PLAVIX    CORONARY STENT INTERVENTION N/A 11/24/2022   Procedure: CORONARY STENT INTERVENTION;  Surgeon: Darron Deatrice LABOR, MD;  Location: [facility];  Service: Cardiovascular;  Laterality: N/A;   ESOPHAGOGASTRODUODENOSCOPY (EGD) WITH PROPOFOL  N/A 09/26/2021   Procedure: ESOPHAGOGASTRODUODENOSCOPY (EGD) WITH PROPOFOL ;  Surgeon: Onita Elspeth Sharper, DO;  Location: [facility] ENDOSCOPY;  Service: Gastroenterology;  Laterality: N/A;   INCISION AND DRAINAGE OF WOUND Left 06/07/2019   Procedure: IRRIGATION AND DEBRIDEMENT WOUND;  Surgeon: Lilli Cough, DPM;  Location: [facility] OR[facility];  Service: Podiatry;  Laterality: Left;   KNEE ARTHROPLASTY Right 11/13/2021   Procedure: COMPUTER ASSISTED TOTAL KNEE ARTHROPLASTY;  Surgeon: Mardee Lynwood SQUIBB, MD;  Location: [facility] ORS;  Service: Orthopedics;  Laterality: Right;   LEFT HEART CATH AND CORONARY ANGIOGRAPHY Right 11/24/2022   Procedure: LEFT HEART CATH AND CORONARY ANGIOGRAPHY with possible coronary intervention;  Surgeon: Fernand Denyse LABOR, MD;  Location: [facility];  Service: Cardiovascular;  Laterality: Right;   LOWER EXTREMITY ANGIOGRAPHY Left 06/03/2019   Procedure: Lower Extremity Angiography;  Surgeon: Jama Cordella MATSU, MD;  Location: [facility];  Service: Cardiovascular;  Laterality: Left;   LOWER EXTREMITY ANGIOGRAPHY Right 05/21/2021   Procedure: LOWER EXTREMITY ANGIOGRAPHY;  Surgeon: Jama Cordella MATSU, MD;  Location: [facility];  Service: Cardiovascular;  Laterality: Right;   PERIPHERAL VASCULAR BALLOON ANGIOPLASTY Left 05/16/2019   Procedure: PERIPHERAL VASCULAR BALLOON ANGIOPLASTY;  Surgeon: Court Dorn PARAS, MD;  Location: [facility];  Service: Cardiovascular;  Laterality: Left;  SFA   TUBAL LIGATION  1972   VEIN LIGATION AND STRIPPING     Family History  Problem Relation Age of Onset   Arthritis Mother    Heart attack Father    Cancer Sister    Cancer  - Prostate Brother    Colon cancer Neg Hx    Breast cancer Neg Hx    Social History   Socioeconomic History   Marital status: Married    Spouse name: [name]   Number of children: 2   Years of education: Not on file   Highest education level: Not on file  Occupational History   Occupation: retired    Comment: Market researcher  Tobacco Use  Smoking status: Former    Current packs/day: 0.00    Average packs/day: 0.5 packs/day for 15.0 years (7.5 ttl pk-yrs)    Types: Cigarettes    Start date: 05/12/1973    Quit date: 05/12/1988    Years since quitting: 35.7   Smokeless tobacco: Never  Vaping Use   Vaping status: Never Used  Substance and Sexual Activity   Alcohol use: No    Alcohol/week: 0.0 standard drinks of alcohol   Drug use: No   Sexual activity: Yes  Other Topics Concern   Not on file  Social History Narrative   Lives at home with spouse Lyndy    Social Drivers of Health   Financial Resource Strain: Low Risk  (02/09/2024)   Overall Financial Resource Strain (CARDIA)    Difficulty of Paying Living Expenses: Not hard at all  Food Insecurity: No Food Insecurity (02/09/2024)   Hunger Vital Sign    Worried About Running Out of Food in the Last Year: Never true    Ran Out of Food in the Last Year: Never true  Transportation Needs: No Transportation Needs (02/09/2024)   PRAPARE - Administrator, Civil Service (Medical): No    Lack of Transportation (Non-Medical): No  Physical Activity: Inactive (02/09/2024)   Exercise Vital Sign    Days of Exercise per Week: 0 days    Minutes of Exercise per Session: 0 min  Stress: No Stress Concern Present (02/09/2024)   [employer] of Occupational Health - Occupational Stress Questionnaire    Feeling of Stress: Not at all  Social Connections: Moderately Integrated (02/09/2024)   Social Connection and Isolation Panel    Frequency of Communication with Friends and Family: More than three times a week    Frequency  of Social Gatherings with Friends and Family: Twice a week    Attends Religious Services: More than 4 times per year    Active Member of [employer] or Organizations: No    Attends Engineer, structural: Never    Marital Status: Married    Tobacco Counseling Counseling given: Not Answered    Clinical Intake:  Pre-visit preparation completed: Yes  Pain : No/denies pain  Diabetes: Yes CBG done?: No Did pt. bring in CBG monitor from home?: No  Lab Results  Component Value Date   HGBA1C 6.1 01/08/2024   HGBA1C 5.8 10/06/2023   HGBA1C 6.1 06/24/2023     How often do you need to have someone help you when you read instructions, pamphlets, or other written materials from your doctor or pharmacy?: 1 - Never  Interpreter Needed?: No  Information entered by :: Charmaine Bloodgood LPN   Activities of Daily Living     02/09/2024    9:09 AM 07/20/2023    7:00 AM  In your present state of health, do you have any difficulty performing the following activities:  Hearing? 0 0  Vision? 0 0  Difficulty concentrating or making decisions? 0 0  Walking or climbing stairs? 1   Dressing or bathing? 0   Doing errands, shopping? 1 0  Preparing Food and eating ? N   Using the Toilet? N   In the past six months, have you accidently leaked urine? N   Do you have problems with loss of bowel control? N   Managing your Medications? N   Managing your Finances? Y   Housekeeping or managing your Housekeeping? Y     Patient Care Team: Glendia Shad, MD as PCP - General (  Internal Medicine) Darliss Rogue, MD as PCP - Cardiology (Cardiology) Debarah Norleen Kayla DOUGLAS, MD as Referring Physician (Ophthalmology) Darliss Rogue, MD as Consulting Physician (Cardiology) Defoor, Elsie HERO, PA-C (Rheumatology) Twylla Glendia BROCKS, MD (Urology) Dominica Brandy, MD as Consulting Physician (Nephrology)  I have updated your Care Teams any recent Medical Services you may have received from other  providers in the past year.     Assessment:   This is a routine wellness examination for Adja.  Hearing/Vision screen Hearing Screening - Comments:: Patient is able to hear conversational tones without difficulty. No issues reported. Vision Screening - Comments:: Wears rx glasses - up to date with routine eye exams with Dr. Norleen Debarah     Goals Addressed               This Visit's Progress     DIET - EAT MORE FRUITS AND VEGETABLES (pt-stated)   On track     Prevent falls   On track      Depression Screen     02/09/2024    9:08 AM 02/02/2024    1:30 PM 02/04/2023    1:04 PM 01/15/2023    9:13 AM 07/14/2022   10:52 AM 04/14/2022   11:32 AM 12/24/2021    3:11 PM  PHQ 2/9 Scores  PHQ - 2 Score 0 0 0 0 0 0 0  PHQ- 9 Score   0 0       Fall Risk     02/09/2024    9:08 AM 03/19/2023    8:33 AM 02/04/2023    1:04 PM 01/15/2023    9:16 AM 07/14/2022   10:51 AM  Fall Risk   Falls in the past year? 0 1 0 0 0  Number falls in past yr: 0 0 0 0 0  Injury with Fall? 0 1 0 0 0  Risk for fall due to : Impaired mobility;Impaired balance/gait History of fall(s) No Fall Risks Impaired mobility No Fall Risks  Follow up Falls prevention discussed;Education provided;Falls evaluation completed Falls evaluation completed Falls evaluation completed Falls prevention discussed;Falls evaluation completed Falls evaluation completed    MEDICARE RISK AT HOME:  Medicare Risk at Home Any stairs in or around the home?: No If so, are there any without handrails?: No Home free of loose throw rugs in walkways, pet beds, electrical cords, etc?: Yes Adequate lighting in your home to reduce risk of falls?: Yes Life alert?: No Use of a cane, walker or w/c?: Yes Grab bars in the bathroom?: Yes Shower chair or bench in shower?: Yes Elevated toilet seat or a handicapped toilet?: Yes  TIMED UP AND GO:  Was the test performed?  No  Cognitive Function: 6CIT completed    11/06/2017   10:26 AM 03/20/2016     4:52 PM  MMSE - Mini Mental State Exam  Not completed:  Unable to complete  Orientation to time 5   Orientation to Place 5   Registration 3   Attention/ Calculation 5   Recall 2   Language- name 2 objects 2   Language- repeat 1   Language- follow 3 step command 3   Language- read & follow direction 1   Write a sentence 1   Copy design 1   Total score 29         02/09/2024    9:09 AM 01/15/2023    9:18 AM 11/20/2021   10:25 AM 11/17/2019   12:55 PM 11/09/2018    8:42 AM  6CIT  Screen  What Year? 0 points 0 points 0 points 0 points 0 points  What month? 0 points 0 points 0 points 0 points 0 points  What time? 0 points 0 points 0 points  0 points  Count back from 20 0 points 0 points   0 points  Months in reverse 0 points 0 points 0 points 0 points 0 points  Repeat phrase 2 points 0 points  0 points   Total Score 2 points 0 points       Immunizations Immunization History  Administered Date(s) Administered   Fluad Quad(high Dose 65+) 02/07/2019, 01/15/2021, 02/13/2022   Fluad Trivalent(High Dose 65+) 01/29/2023   INFLUENZA, HIGH DOSE SEASONAL PF 04/14/2016, 02/23/2018, 02/29/2020, 02/02/2024   Influenza Split 02/09/2013   Influenza Whole 02/18/2017   Influenza,inj,Quad PF,6+ Mos 02/28/2014, 02/01/2015   Influenza-Unspecified 02/20/2012   PFIZER Comirnaty(Gray Top)Covid-19 Tri-Sucrose Vaccine 07/07/2019, 07/28/2019   PFIZER(Purple Top)SARS-COV-2 Vaccination 07/07/2019, 07/28/2019, 02/13/2020   Pneumococcal Conjugate-13 09/06/2013   Pneumococcal Polysaccharide-23 06/12/2017    Screening Tests Health Maintenance  Topic Date Due   OPHTHALMOLOGY EXAM  10/24/2022   FOOT EXAM  12/25/2022   COVID-19 Vaccine (6 - 2025-26 season) 02/18/2024 (Originally 01/11/2024)   DEXA SCAN  10/05/2024 (Originally 05/22/2006)   Diabetic kidney evaluation - Urine ACR  06/23/2024   HEMOGLOBIN A1C  07/09/2024   Diabetic kidney evaluation - eGFR measurement  01/07/2025   Medicare Annual Wellness  (AWV)  02/08/2025   Pneumococcal Vaccine: 50+ Years  Completed   Influenza Vaccine  Completed   HPV VACCINES  Aged Out   Meningococcal B Vaccine  Aged Out   DTaP/Tdap/Td  Discontinued   Mammogram  Discontinued   Zoster Vaccines- Shingrix  Discontinued    Health Maintenance Items Addressed:  Requesting records for last diabetic eye exam   Additional Screening:  Vision Screening: Recommended annual ophthalmology exams for early detection of glaucoma and other disorders of the eye. Is the patient up to date with their annual eye exam?  Yes  Who is the provider or what is the name of the office in which the patient attends annual eye exams? Dr. Norleen Devonshire [address] [facility])   Dental Screening: Recommended annual dental exams for proper oral hygiene  Community Resource Referral / Chronic Care Management: CRR required this visit?  No   CCM required this visit?  No   Plan:    I have personally reviewed and noted the following in the patient's chart:   Medical and social history Use of alcohol, tobacco or illicit drugs  Current medications and supplements including opioid prescriptions. Patient is not currently taking opioid prescriptions. Functional ability and status Nutritional status Physical activity Advanced directives List of other physicians Hospitalizations, surgeries, and ER visits in previous 12 months Vitals Screenings to include cognitive, depression, and falls Referrals and appointments  In addition, I have reviewed and discussed with patient certain preventive protocols, quality metrics, and best practice recommendations. A written personalized care plan for preventive services as well as general preventive health recommendations were provided to patient.   Lavelle Pfeiffer [place], [place]   [id number]   After Visit Summary: (MyChart) Due to this being a telephonic visit, the after visit summary with patients personalized plan was offered to patient via MyChart    Notes: Nothing significant to report at this time.

## 2024-02-09 NOTE — Patient Instructions (Signed)
 Nina Perez,  Thank you for taking the time for your Medicare Wellness Visit. I appreciate your continued commitment to your health goals. Please review the care plan we discussed, and feel free to reach out if I can assist you further.  Medicare recommends these wellness visits once per year to help you and your care team stay ahead of potential health issues. These visits are designed to focus on prevention, allowing your provider to concentrate on managing your acute and chronic conditions during your regular appointments.  Please note that Annual Wellness Visits do not include a physical exam. Some assessments may be limited, especially if the visit was conducted virtually. If needed, we may recommend a separate in-person follow-up with your provider.  Ongoing Care Seeing your primary care provider every 3 to 6 months helps us  monitor your health and provide consistent, personalized care.   Referrals If a referral was made during today's visit and you haven't received any updates within two weeks, please contact the referred provider directly to check on the status.  Recommended Screenings:  Health Maintenance  Topic Date Due   Eye exam for diabetics  10/24/2022   Complete foot exam   12/25/2022   COVID-19 Vaccine (6 - 2025-26 season) 02/18/2024*   DEXA scan (bone density measurement)  10/05/2024*   Yearly kidney health urinalysis for diabetes  06/23/2024   Hemoglobin A1C  07/09/2024   Yearly kidney function blood test for diabetes  01/07/2025   Medicare Annual Wellness Visit  02/08/2025   Pneumococcal Vaccine for age over 30  Completed   Flu Shot  Completed   HPV Vaccine  Aged Out   Meningitis B Vaccine  Aged Out   DTaP/Tdap/Td vaccine  Discontinued   Breast Cancer Screening  Discontinued   Zoster (Shingles) Vaccine  Discontinued  *Topic was postponed. The date shown is not the original due date.       02/09/2024    9:09 AM  Advanced Directives  Does Patient Have a Medical  Advance Directive? No  Would patient like information on creating a medical advance directive? Yes (MAU/Ambulatory/Procedural Areas - Information given)   Advance Care Planning is important because it: Ensures you receive medical care that aligns with your values, goals, and preferences. Provides guidance to your family and loved ones, reducing the emotional burden of decision-making during critical moments.  Information on Advanced Care Planning can be found at [place]  Secretary of [facility] Advance Health Care Directives Advance Health Care Directives ([email or web address]   Vision: Annual vision screenings are recommended for early detection of glaucoma, cataracts, and diabetic retinopathy. These exams can also reveal signs of chronic conditions such as diabetes and high blood pressure.  Dental: Annual dental screenings help detect early signs of oral cancer, gum disease, and other conditions linked to overall health, including heart disease and diabetes.  Please see the attached documents for additional preventive care recommendations.

## 2024-02-11 NOTE — Addendum Note (Signed)
 Addended by: LEARTA PORTO D on: 02/11/2024 09:10 AM   Modules accepted: Orders

## 2024-02-17 ENCOUNTER — Other Ambulatory Visit: Payer: Self-pay | Admitting: Internal Medicine

## 2024-03-08 ENCOUNTER — Telehealth: Payer: Self-pay

## 2024-03-08 NOTE — Telephone Encounter (Signed)
 Received paperwork from [employer]. Placed in folder for review

## 2024-03-09 NOTE — Telephone Encounter (Signed)
Forms faxed to [employer]

## 2024-03-09 NOTE — Telephone Encounter (Signed)
 Signed and placed in box.

## 2024-03-15 ENCOUNTER — Encounter: Payer: Self-pay | Admitting: *Deleted

## 2024-03-29 NOTE — Telephone Encounter (Signed)
 open in error

## 2024-05-22 ENCOUNTER — Other Ambulatory Visit: Payer: Self-pay | Admitting: Internal Medicine

## 2024-05-23 ENCOUNTER — Other Ambulatory Visit: Payer: Self-pay | Admitting: Internal Medicine

## 2024-05-26 NOTE — Telephone Encounter (Signed)
 Rx ok'd for methotrexate . Diagnosis provided (RA).

## 2024-05-30 ENCOUNTER — Telehealth: Payer: Self-pay

## 2024-05-30 NOTE — Telephone Encounter (Signed)
 Copied from CRM [id number]. Topic: Clinical - Medical Advice >> May 30, 2024  1:53 PM Laymon HERO wrote: Reason for CRM: patient Husband Trisha Ken wanting a nurse or Dr Glendia to call him to discuss wife going to a rehab facility. Please call his cell # [phone]

## 2024-06-01 NOTE — Telephone Encounter (Signed)
 Do we have the forrms. If not, need forms. If so, ok to complete.

## 2024-06-01 NOTE — Telephone Encounter (Signed)
 Called and spoke to pt husband. Husband stated he would like fl2 forms filled out for pt when they come in for pt's appt. Offered a sooner appt husband stated that due to transportation it would be hard to come in sooner.

## 2024-06-03 NOTE — Telephone Encounter (Signed)
 Per note. Hold form for 07/2024 appt. Placed in accordion. Discussed.

## 2024-07-11 ENCOUNTER — Ambulatory Visit: Admitting: Internal Medicine

## 2025-02-10 ENCOUNTER — Ambulatory Visit
